# Patient Record
Sex: Female | Born: 1960
Health system: Southern US, Community
[De-identification: ages and names within clinical notes are randomized; demographics above are authoritative.]

## PROBLEM LIST (undated history)

## (undated) DIAGNOSIS — S83249A Other tear of medial meniscus, current injury, unspecified knee, initial encounter: Secondary | ICD-10-CM

## (undated) DIAGNOSIS — E039 Hypothyroidism, unspecified: Secondary | ICD-10-CM

## (undated) DIAGNOSIS — C801 Malignant (primary) neoplasm, unspecified: Secondary | ICD-10-CM

## (undated) DIAGNOSIS — Z9889 Other specified postprocedural states: Secondary | ICD-10-CM

## (undated) DIAGNOSIS — R51 Headache: Secondary | ICD-10-CM

## (undated) DIAGNOSIS — I10 Essential (primary) hypertension: Secondary | ICD-10-CM

## (undated) DIAGNOSIS — Z8489 Family history of other specified conditions: Secondary | ICD-10-CM

## (undated) DIAGNOSIS — F32A Depression, unspecified: Secondary | ICD-10-CM

## (undated) DIAGNOSIS — R112 Nausea with vomiting, unspecified: Secondary | ICD-10-CM

## (undated) DIAGNOSIS — R7303 Prediabetes: Secondary | ICD-10-CM

## (undated) DIAGNOSIS — G4730 Sleep apnea, unspecified: Secondary | ICD-10-CM

## (undated) DIAGNOSIS — E785 Hyperlipidemia, unspecified: Secondary | ICD-10-CM

## (undated) DIAGNOSIS — F419 Anxiety disorder, unspecified: Secondary | ICD-10-CM

## (undated) DIAGNOSIS — M1990 Unspecified osteoarthritis, unspecified site: Secondary | ICD-10-CM

## (undated) DIAGNOSIS — M5440 Lumbago with sciatica, unspecified side: Secondary | ICD-10-CM

## (undated) DIAGNOSIS — E119 Type 2 diabetes mellitus without complications: Secondary | ICD-10-CM

## (undated) HISTORY — DX: Other tear of medial meniscus, current injury, unspecified knee, initial encounter: S83.249A

## (undated) HISTORY — DX: Malignant (primary) neoplasm, unspecified: C80.1

## (undated) HISTORY — DX: Hyperlipidemia, unspecified: E78.5

## (undated) HISTORY — PX: MOUTH SURGERY: SHX715

## (undated) HISTORY — DX: Type 2 diabetes mellitus without complications: E11.9

## (undated) HISTORY — PX: ECTOPIC PREGNANCY SURGERY: SHX613

## (undated) HISTORY — PX: TUBAL LIGATION: SHX77

## (undated) HISTORY — PX: TONSILLECTOMY: SUR1361

## (undated) HISTORY — PX: SPINAL FUSION: SHX223

## (undated) HISTORY — DX: Essential (primary) hypertension: I10

## (undated) HISTORY — PX: GALLBLADDER SURGERY: SHX652

## (undated) HISTORY — PX: BILATERAL TOTAL MASTECTOMY WITH AXILLARY LYMPH NODE DISSECTION: SHX6364

## (undated) HISTORY — PX: COLONOSCOPY: SHX174

---

## 1997-12-12 ENCOUNTER — Ambulatory Visit (HOSPITAL_COMMUNITY): Admission: RE | Admit: 1997-12-12 | Discharge: 1997-12-12 | Payer: Self-pay | Admitting: Family Medicine

## 1999-04-11 ENCOUNTER — Other Ambulatory Visit: Admission: RE | Admit: 1999-04-11 | Discharge: 1999-04-11 | Payer: Self-pay | Admitting: Obstetrics and Gynecology

## 2000-05-07 ENCOUNTER — Other Ambulatory Visit: Admission: RE | Admit: 2000-05-07 | Discharge: 2000-05-07 | Payer: Self-pay | Admitting: Obstetrics and Gynecology

## 2000-06-25 ENCOUNTER — Encounter: Payer: Self-pay | Admitting: *Deleted

## 2000-06-25 ENCOUNTER — Observation Stay (HOSPITAL_COMMUNITY): Admission: EM | Admit: 2000-06-25 | Discharge: 2000-06-26 | Payer: Self-pay | Admitting: Emergency Medicine

## 2000-06-25 ENCOUNTER — Encounter: Payer: Self-pay | Admitting: Emergency Medicine

## 2000-07-14 ENCOUNTER — Encounter: Admission: RE | Admit: 2000-07-14 | Discharge: 2000-08-27 | Payer: Self-pay | Admitting: Orthopaedic Surgery

## 2002-04-01 ENCOUNTER — Encounter: Payer: Self-pay | Admitting: Internal Medicine

## 2002-04-01 ENCOUNTER — Encounter: Admission: RE | Admit: 2002-04-01 | Discharge: 2002-04-01 | Payer: Self-pay | Admitting: Internal Medicine

## 2002-06-17 ENCOUNTER — Other Ambulatory Visit: Admission: RE | Admit: 2002-06-17 | Discharge: 2002-06-17 | Payer: Self-pay | Admitting: Obstetrics and Gynecology

## 2002-12-01 ENCOUNTER — Encounter: Admission: RE | Admit: 2002-12-01 | Discharge: 2002-12-01 | Payer: Self-pay | Admitting: Surgery

## 2003-06-06 ENCOUNTER — Ambulatory Visit (HOSPITAL_COMMUNITY): Admission: RE | Admit: 2003-06-06 | Discharge: 2003-06-06 | Payer: Self-pay | Admitting: Gastroenterology

## 2003-07-28 ENCOUNTER — Other Ambulatory Visit: Admission: RE | Admit: 2003-07-28 | Discharge: 2003-07-28 | Payer: Self-pay | Admitting: Obstetrics and Gynecology

## 2003-08-09 ENCOUNTER — Encounter: Admission: RE | Admit: 2003-08-09 | Discharge: 2003-08-09 | Payer: Self-pay | Admitting: Obstetrics and Gynecology

## 2004-08-31 ENCOUNTER — Other Ambulatory Visit: Admission: RE | Admit: 2004-08-31 | Discharge: 2004-08-31 | Payer: Self-pay | Admitting: Obstetrics and Gynecology

## 2004-11-29 ENCOUNTER — Encounter: Admission: RE | Admit: 2004-11-29 | Discharge: 2005-01-20 | Payer: Self-pay | Admitting: *Deleted

## 2005-08-28 ENCOUNTER — Encounter: Admission: RE | Admit: 2005-08-28 | Discharge: 2005-08-28 | Payer: Self-pay | Admitting: *Deleted

## 2009-05-01 ENCOUNTER — Encounter: Admission: RE | Admit: 2009-05-01 | Discharge: 2009-05-01 | Payer: Self-pay | Admitting: Obstetrics and Gynecology

## 2010-06-08 NOTE — Op Note (Signed)
. [facility]  Patient:    [name]                      MRN: [id number] Proc. Date: 06/25/00 Adm. Date:  [id number] Disc. Date: [id number] Attending:  Kendell Bane CC:         Quita Skye. Artis Flock, M.D.   Operative Report  PREOPERATIVE DIAGNOSIS:  Near amputation, right small finger.  POSTOPERATIVE DIAGNOSIS:  Near amputation, right small finger.  OPERATION PERFORMED:  Repair of partial flexor digitorum profundus laceration, pin proximal interphalangeal joint, repair of radial digital artery and nerve, repair of ulnar digital nerve, repair of dorsal vein, right small finger.  SURGEON:  Lowell Bouton, M.D.  ANESTHESIA:  General.  OPERATIVE FINDINGS:  The patient had Moore laceration at the base of the right small finger that extended almost circumferentially.  There was Moore very [address] of skin radially and dorsally.  The ulnar digital artery was contused but was intact.  The FDS tendon was intact.  The flexor digitorum profundus was 50% laceration and the proximal interphalangeal joint was dislocated volarly.  The digital nerves were both transected as was the radial digital artery.  The extensor mechanism appeared to be intact.  DESCRIPTION OF PROCEDURE:  Under general anesthesia with Moore tourniquet on the right arm, the right hand was prepped and draped in the usual fashion and after exsanguinating the limb, the tourniquet was inflated to 250 mmHg.  The wound was explored volarly and was found to be almost Moore type of ring avulsion injury.  The laceration was extended proximally in Moore zigzag fashion back over the metacarpal head.  The profundus tendon was identified in the sheath and it was found to be 50% transected obliquely. It was repaired with Moore 3-0 Ethibond core suture just at the level beyond Moore-2.  It was reinforced with Moore 5-0 nylon to tidy up the edges of the repair and allow gliding beneath the Moore-2 pulley. The  PIP joint was then reduced closed and percutaneously pinned with Moore 4-5 K-wire x 2.  X-rays showed good alignment.  The pins were bent over and left protruding from the skin.  The extensor mechanism appeared to be intact.  The microscope was then brought in volarly and the radial digital artery was prepared for repair by trimming back the ends and dilating with dilators and ____________ solution.  The radial digital artery was repaired with Moore 10-0 nylon suture in an interrupted fashion.  The vascular clamps were removed and the tourniquet remained up.  The radial digital nerve was then identified and dissected out proximally and distally.  The ends were trimmed with the straight scissors and an epineurial repair was performed using 9-0 nylon.  The ulnar digital neurovascular bundle was then identified under the microscope and the ulnar digital artery appeared to be intact but contused.  The ulnar digital nerve was dissected out proximally and distally.  The ends were trimmed with the straight scissors and an epineurial repair was performed with 9-0 nylon.  The tourniquet was then released after two hours and there was good circulation to the finger.  The vascular repairs appeared to be functioning.  4-0 nylon was used to close the volar wounds.  The hand was then turned over to the dorsum and the microscope was brought back in to identify dorsal veins.  Moore large dorsal vein was evident distally and Moore vessel clamp was placed on it.  Moore  flap was lifted with Moore skin hook proximally and Moore smaller dorsal vein was identified.  Both veins were bleeding profusely and vessel clamps were applied.  The ends were trimmed and dilated with ____________ solution and Moore dilator and the dorsal vein was repaired with Moore 10-0 nylon suture.  The vessel clamps were removed and there was good flow both proximally and distally in the vein.  The dorsal skin was then barely reapproximated with 4-0 nylon suture leaving  plenty of area for the vessel to flow.  Sterile dressings were then applied followed by Moore dorsal protective splint.  The patient went to the recovery room awake and stable in good condition. DD:  06/25/00 TD:  06/26/00 Job: [id number]

## 2010-06-08 NOTE — Op Note (Signed)
NAME:  [name], [name]                         ACCOUNT NO.:  [id number]   MEDICAL RECORD NO.:  [id number]                   PATIENT TYPE:  AMB   LOCATION:  ENDO                                 FACILITY:  [facility]   PHYSICIAN:  Anselmo Rod, M.D.               DATE OF BIRTH:  [birth year]   DATE OF PROCEDURE:  06/06/2003  DATE OF DISCHARGE:                                 OPERATIVE REPORT   PROCEDURE PERFORMED:  Screening colonoscopy.   ENDOSCOPIST:  Charna Elizabeth, M.D.   INSTRUMENT USED:  Olympus video colonoscope.   INDICATIONS FOR PROCEDURE:  The patient is a 50 year old white female with a  history of rectal bleeding.  Rule out colonic polyps, masses, etc.   PREPROCEDURE PREPARATION:  Informed consent was procured from the patient.  The patient was fasted for eight hours prior to the procedure and prepped  with a bottle of magnesium citrate and a gallon of GoLYTELY the night prior  to the procedure.   PREPROCEDURE PHYSICAL:  The patient had stable vital signs.  Neck supple.  Chest clear to auscultation.  S1 and S2 regular.  Abdomen soft with normal  bowel sounds.   DESCRIPTION OF PROCEDURE:  The patient was placed in left lateral decubitus  position and sedated with 100 mg of Demerol and 10 mg of Versed in slow  incremental doses.  Once the patient was adequately sedated and maintained  on low flow oxygen and continuous cardiac monitoring, the Olympus video  colonoscope was advanced from the rectum to the cecum.  The appendicular  orifice and ileocecal valve were clearly visualized and photographed.  The  terminal ileum appeared healthy and without lesions.  Small internal  hemorrhoids were seen on retroflexion.  No masses or polyps were identified.  The patient tolerated the procedure well without immediate complications.   IMPRESSION:  Small internal hemorrhoids.  Otherwise normal colonoscopy up to  the terminal ileum.   RECOMMENDATIONS:  1. Continue high fiber diet with  liberal fluid intake.  2. Repeat colorectal cancer screening is recommended in the next 10 years     unless the patient develops any abnormal symptoms in the interim.  3. Outpatient followup as need arises in the future.                                               Anselmo Rod, M.D.    JNM/MEDQ  D:  06/06/2003  T:  06/06/2003  Job:  [id number]   cc:   Marcene Duos, M.D.  [address]  [place]  [place] [address]  Fax: [phone]

## 2011-11-11 ENCOUNTER — Ambulatory Visit: Payer: 59

## 2011-11-11 ENCOUNTER — Ambulatory Visit (INDEPENDENT_AMBULATORY_CARE_PROVIDER_SITE_OTHER): Payer: 59 | Admitting: Family Medicine

## 2011-11-11 VITALS — BP 142/78 | HR 87 | Temp 97.9°F | Resp 16 | Ht 63.0 in | Wt 227.0 lb

## 2011-11-11 DIAGNOSIS — M25472 Effusion, left ankle: Secondary | ICD-10-CM

## 2011-11-11 DIAGNOSIS — M25579 Pain in unspecified ankle and joints of unspecified foot: Secondary | ICD-10-CM

## 2011-11-11 DIAGNOSIS — M25572 Pain in left ankle and joints of left foot: Secondary | ICD-10-CM

## 2011-11-11 DIAGNOSIS — M25473 Effusion, unspecified ankle: Secondary | ICD-10-CM

## 2011-11-11 DIAGNOSIS — M25476 Effusion, unspecified foot: Secondary | ICD-10-CM

## 2011-11-11 MED ORDER — TRAMADOL HCL 50 MG PO TABS: 50.0000 mg | ORAL_TABLET | Freq: Three times a day (TID) | ORAL | Status: DC | PRN

## 2011-11-11 NOTE — Progress Notes (Signed)
 [facility]:  Office Visit  Chief Complaint:  Chief Complaint  Patient presents with  . Ankle Pain    left pain went hiking yesterday an slipped on a rock    HPI: [name] is a 51 y.o. female who complains of  Left ankle pain and swelling after falling during hike at [employer]. She was going down hill and did not see rock and inverted her ankle. She has tried RICE, and also Ibuprofen with some relief. No prior injuries or ankle surgeries. Denies and numbness or tingling. Sharp 10/10 pain with weight bearing, otherwise achey when just sitting. Radiates to dorsum of foot.   Past Medical History  Diagnosis Date  . Hypertension    Past Surgical History  Procedure Date  . Colon surgery   . Gallbladder surgery   . Cesarean section   . Tubal ligation    History   Social History  . Marital Status: Married    Spouse Name: N/A    Number of Children: N/A  . Years of Education: N/A   Social History Main Topics  . Smoking status: Never Smoker   . Smokeless tobacco: None  . Alcohol Use: No  . Drug Use: No  . Sexually Active: None   Other Topics Concern  . None   Social History Narrative  . None   Family History  Problem Relation Age of Onset  . Heart disease Father   . Hypertension Father   . Cancer Maternal Grandmother     breast cancer  . Diabetes Paternal Grandmother   . Heart disease Paternal Grandfather   . Hypertension Paternal Grandfather    Allergies  Allergen Reactions  . Effexor (Venlafaxine Hcl)     Weight loss and cant sleep   Prior to Admission medications   Medication Sig Start Date End Date Taking? Authorizing Provider  cyclobenzaprine (FLEXERIL) 10 MG tablet Take 10 mg by mouth 3 (three) times daily as needed.   Yes Historical Provider, MD  FEVERFEW PO Take by mouth.   Yes Historical Provider, MD  fish oil-omega-3 fatty acids 1000 MG capsule Take 2 g by mouth daily.   Yes Historical Provider, MD  metoprolol succinate  (TOPROL-XL) 100 MG 24 hr tablet Take 100 mg by mouth daily. Take with or immediately following a meal.   Yes Historical Provider, MD  Riboflavin 400 MG CAPS Take by mouth.   Yes Historical Provider, MD  rizatriptan (MAXALT) 10 MG tablet Take 10 mg by mouth as needed. May repeat in 2 hours if needed   Yes Historical Provider, MD  thyroid (ARMOUR) 30 MG tablet Take 30 mg by mouth daily.   Yes Historical Provider, MD  Vitamin D, Ergocalciferol, (DRISDOL) 50000 UNITS CAPS Take 50,000 Units by mouth.   Yes Historical Provider, MD  zolpidem (AMBIEN) 5 MG tablet Take 5 mg by mouth at bedtime as needed.   Yes Historical Provider, MD     ROS: The patient denies fevers, chills, night sweats, unintentional weight loss, chest pain, palpitations, wheezing, dyspnea on exertion, nausea, vomiting, abdominal pain, dysuria, hematuria, melena, numbness, weakness, or tingling.   All other systems have been reviewed and were otherwise negative with the exception of those mentioned in the HPI and as above.    PHYSICAL EXAM: Filed Vitals:   11/11/11 1454  BP: 142/78  Pulse: 87  Temp: 97.9 F (36.6 C)  Resp: 16   Filed Vitals:   11/11/11 1454  Height: 5\' 3"  (1.6  m)  Weight: 227 lb (102.967 kg)   Body mass index is 40.21 kg/(m^2).  General: Alert, no acute distress HEENT:  Normocephalic, atraumatic, oropharynx patent.  Cardiovascular:  Regular rate and rhythm, no rubs murmurs or gallops.  No Carotid bruits, radial pulse intact. No pedal edema.  Respiratory: Clear to auscultation bilaterally.  No wheezes, rales, or rhonchi.  No cyanosis, no use of accessory musculature GI: No organomegaly, abdomen is soft and non-tender, positive bowel sounds.  No masses. Skin: No rashes. Neurologic: Facial musculature symmetric. Psychiatric: Patient is appropriate throughout our interaction. Lymphatic: No cervical lymphadenopathy Musculoskeletal: Gait intact. Left knee-nl Left ankle-+ lateral malleoli swelling, + pain  with ROM in all direction , especially adduction, + DP, + post tib artery, 5/5 strength, senstationitact , 2/2 S1 DTR   LABS: No results found for this or any previous visit.   EKG/XRAY:   Primary read interpreted by Dr. Conley Rolls at [facility]. No obvious fracture/dislocation  but ? Ragged cortex syndesmosis + soft tissue swelling   ASSESSMENT/PLAN: Encounter Diagnoses  Name Primary?  . Pain, joint, ankle, left Yes  . Left ankle swelling    Crutches and Cam walker Continue with RICE Ibuprofen 600 mg QID with food Rx Tramadol for breakthrough pain F/u in 1 week.  Nonweightbearing for 2 days and then advance as tolerated     ,  PHUONG, DO 11/11/2011 3:27 PM

## 2011-11-19 ENCOUNTER — Ambulatory Visit (INDEPENDENT_AMBULATORY_CARE_PROVIDER_SITE_OTHER): Payer: 59 | Admitting: Family Medicine

## 2011-11-19 VITALS — BP 126/80 | HR 76 | Temp 97.6°F | Resp 18 | Ht 62.5 in | Wt 225.8 lb

## 2011-11-19 DIAGNOSIS — S93409A Sprain of unspecified ligament of unspecified ankle, initial encounter: Secondary | ICD-10-CM

## 2011-11-19 DIAGNOSIS — M25579 Pain in unspecified ankle and joints of unspecified foot: Secondary | ICD-10-CM

## 2011-11-19 DIAGNOSIS — M25572 Pain in left ankle and joints of left foot: Secondary | ICD-10-CM

## 2011-11-19 DIAGNOSIS — M25472 Effusion, left ankle: Secondary | ICD-10-CM

## 2011-11-19 DIAGNOSIS — S93402A Sprain of unspecified ligament of left ankle, initial encounter: Secondary | ICD-10-CM

## 2011-11-19 DIAGNOSIS — M25473 Effusion, unspecified ankle: Secondary | ICD-10-CM

## 2011-11-19 DIAGNOSIS — M25476 Effusion, unspecified foot: Secondary | ICD-10-CM

## 2011-11-19 MED ORDER — TRAMADOL HCL 50 MG PO TABS: 50.0000 mg | ORAL_TABLET | Freq: Three times a day (TID) | ORAL | Status: DC | PRN

## 2011-11-19 NOTE — Progress Notes (Signed)
 [facility]:  Office Visit  Chief Complaint:  Chief Complaint  Patient presents with  . Follow-up    L Ankle    HPI: [name] is a 51 y.o. female who complains of  Recheck for left ankle pain s/p twisting it 8 days ago. She has less pain today , she has been able to bear weight on it. Still has some night pain, swelling. Has been doing ABCs. Tramadol helps. Has been weightbearing about 30% pf here weight.   Past Medical History  Diagnosis Date  . Hypertension    Past Surgical History  Procedure Date  . Colon surgery   . Gallbladder surgery   . Cesarean section   . Tubal ligation    History   Social History  . Marital Status: Married    Spouse Name: N/A    Number of Children: N/A  . Years of Education: N/A   Social History Main Topics  . Smoking status: Never Smoker   . Smokeless tobacco: None  . Alcohol Use: No  . Drug Use: No  . Sexually Active: None   Other Topics Concern  . None   Social History Narrative  . None   Family History  Problem Relation Age of Onset  . Heart disease Father   . Hypertension Father   . Cancer Maternal Grandmother     breast cancer  . Diabetes Paternal Grandmother   . Heart disease Paternal Grandfather   . Hypertension Paternal Grandfather    Allergies  Allergen Reactions  . Effexor (Venlafaxine Hcl)     Weight loss and cant sleep   Prior to Admission medications   Medication Sig Start Date End Date Taking? Authorizing Provider  cyclobenzaprine (FLEXERIL) 10 MG tablet Take 10 mg by mouth 3 (three) times daily as needed.   Yes Historical Provider, MD  FEVERFEW PO Take by mouth.   Yes Historical Provider, MD  fish oil-omega-3 fatty acids 1000 MG capsule Take 2 g by mouth daily.   Yes Historical Provider, MD  ketoprofen (ORUDIS) 75 MG capsule Take 75 mg by mouth 4 (four) times daily as needed.   Yes Historical Provider, MD  metoCLOPramide (REGLAN) 10 MG tablet Take 10 mg by mouth 4 (four) times  daily.   Yes Historical Provider, MD  metoprolol succinate (TOPROL-XL) 100 MG 24 hr tablet Take 100 mg by mouth daily. Take with or immediately following a meal.   Yes Historical Provider, MD  Riboflavin 400 MG CAPS Take by mouth.   Yes Historical Provider, MD  rizatriptan (MAXALT) 10 MG tablet Take 10 mg by mouth as needed. May repeat in 2 hours if needed   Yes Historical Provider, MD  thyroid (ARMOUR) 30 MG tablet Take 30 mg by mouth daily.   Yes Historical Provider, MD  traMADol (ULTRAM) 50 MG tablet Take 1 tablet (50 mg total) by mouth every 8 (eight) hours as needed for pain. 11/11/11  Yes  P , DO  Vitamin D, Ergocalciferol, (DRISDOL) 50000 UNITS CAPS Take 50,000 Units by mouth.   Yes Historical Provider, MD  zolpidem (AMBIEN) 5 MG tablet Take 5 mg by mouth at bedtime as needed.   Yes Historical Provider, MD     ROS: The patient denies fevers, chills, night sweats, unintentional weight loss, chest pain, palpitations, wheezing, dyspnea on exertion, nausea, vomiting, abdominal pain, dysuria, hematuria, melena, numbness, weakness, or tingling.  All other systems have been reviewed and were otherwise negative with the exception of those  mentioned in the HPI and as above.    PHYSICAL EXAM: Filed Vitals:   11/19/11 1348  BP: 126/80  Pulse: 76  Temp: 97.6 F (36.4 C)  Resp: 18   Filed Vitals:   11/19/11 1348  Height: 5' 2.5" (1.588 m)  Weight: 225 lb 12.8 oz (102.422 kg)   Body mass index is 40.64 kg/(m^2).  General: Alert, no acute distress HEENT:  Normocephalic, atraumatic, oropharynx patent.  Cardiovascular:  Regular rate and rhythm, no rubs murmurs or gallops.  No Carotid bruits, radial pulse intact. No pedal edema.  Respiratory: Clear to auscultation bilaterally.  No wheezes, rales, or rhonchi.  No cyanosis, no use of accessory musculature GI: No organomegaly, abdomen is soft and non-tender, positive bowel sounds.  No masses. Skin: No rashes. Neurologic: Facial  musculature symmetric. Psychiatric: Patient is appropriate throughout our interaction. Lymphatic: No cervical lymphadenopathy Musculoskeletal: Gait intact. Left ankle- + lateral edema, + DP, ROM intact but painful with inversions and eversion, 5/5 strength,sensation itnact   LABS: No results found for this or any previous visit.   EKG/XRAY:   Primary read interpreted by Dr. Conley Rolls at [facility].   ASSESSMENT/PLAN: Encounter Diagnosis  Name Primary?  . Left ankle sprain Yes   Continue with RICE, ABCs Use sweedo in 1 week, transition from cam walker to sweedo if able to put more weight on foot F/u in 2 weeks if not improving, sooner if worse    ,  PHUONG, DO 11/19/2011 2:44 PM

## 2012-01-22 DIAGNOSIS — Z9889 Other specified postprocedural states: Secondary | ICD-10-CM

## 2012-01-22 HISTORY — DX: Other specified postprocedural states: Z98.890

## 2013-03-05 ENCOUNTER — Other Ambulatory Visit: Payer: Self-pay | Admitting: Neurological Surgery

## 2013-03-10 ENCOUNTER — Encounter (HOSPITAL_COMMUNITY): Payer: Self-pay | Admitting: Pharmacy Technician

## 2013-03-12 ENCOUNTER — Other Ambulatory Visit (HOSPITAL_COMMUNITY): Payer: Self-pay | Admitting: *Deleted

## 2013-03-12 NOTE — Pre-Procedure Instructions (Addendum)
JEYLA BULGER  03/12/2013   Your procedure is scheduled on:  Friday, March 19, 2013 at 12:00 PM.   Report to [facility] Entrance "A" Admitting Office at 9:00 AM.   Call this number if you have problems the morning of surgery: [phone]   Remember:   Do not eat food or drink liquids after midnight Thursday, 03/18/13.   Take these medicines the morning of surgery with A SIP OF WATER: loratadine (CLARITIN), ranitidine (ZANTAC), thyroid (ARMOUR).  You may take HYDROcodone-acetaminophen (NORCO/VICODIN) - if needed, baclofen (LIORESAL) - if needed, and methocarbamol (ROBAXIN) - if needed.  Stop all Vitamins, Herbal Medications, Ketoprofen and Mobic as of today. Do not take any other NSAIDS (Ibuprofen, Aleve, etc) prior to surgery.    Do not wear jewelry, make-up or nail polish.  Do not wear lotions, powders, or perfumes. You may wear deodorant.  Do not shave 48 hours prior to surgery.   Do not bring valuables to the hospital.  [facility] is not responsible                  for any belongings or valuables.               Contacts, dentures or bridgework may not be worn into surgery.  Leave suitcase in the car. After surgery it may be brought to your room.  For patients admitted to the hospital, discharge time is determined by your                treatment team.                 Special Instructions: [facility] - Preparing for Surgery  Before surgery, you can play an important role.  Because skin is not sterile, your skin needs to be as free of germs as possible.  You can reduce the number of germs on you skin by washing with CHG (chlorahexidine gluconate) soap before surgery.  CHG is an antiseptic cleaner which kills germs and bonds with the skin to continue killing germs even after washing.  Please DO NOT use if you have an allergy to CHG or antibacterial soaps.  If your skin becomes reddened/irritated stop using the CHG and inform your nurse when you arrive at Short  Stay.  Do not shave (including legs and underarms) for at least 48 hours prior to the first CHG shower.  You may shave your face.  Please follow these instructions carefully:   1.  Shower with CHG Soap the night before surgery and the                                morning of Surgery.  2.  If you choose to wash your hair, wash your hair first as usual with your       normal shampoo.  3.  After you shampoo, rinse your hair and body thoroughly to remove the                      Shampoo.  4.  Use CHG as you would any other liquid soap.  You can apply chg directly       to the skin and wash gently with scrungie or a clean washcloth.  5.  Apply the CHG Soap to your body ONLY FROM THE NECK DOWN.        Do not use on open wounds or  open sores.  Avoid contact with your eyes, ears, mouth and genitals (private parts).  Wash genitals (private parts) with your normal soap.  6.  Wash thoroughly, paying special attention to the area where your surgery        will be performed.  7.  Thoroughly rinse your body with warm water from the neck down.  8.  DO NOT shower/wash with your normal soap after using and rinsing off       the CHG Soap.  9.  Pat yourself dry with a clean towel.            10.  Wear clean pajamas.            11.  Place clean sheets on your bed the night of your first shower and do not        sleep with pets.  Day of Surgery  Do not apply any lotions the morning of surgery.  Please wear clean clothes to the hospital/surgery center.     Please read over the following fact sheets that you were given: Pain Booklet, Coughing and Deep Breathing, MRSA Information and Surgical Site Infection Prevention

## 2013-03-15 ENCOUNTER — Encounter (HOSPITAL_COMMUNITY)
Admission: RE | Admit: 2013-03-15 | Discharge: 2013-03-15 | Disposition: A | Discharge status: Home / Self Care | Payer: 59 | Source: Ambulatory Visit | Attending: Anesthesiology | Admitting: Anesthesiology

## 2013-03-15 ENCOUNTER — Encounter (HOSPITAL_COMMUNITY): Payer: Self-pay

## 2013-03-15 ENCOUNTER — Encounter (HOSPITAL_COMMUNITY)
Admission: RE | Admit: 2013-03-15 | Discharge: 2013-03-15 | Disposition: A | Discharge status: Home / Self Care | Payer: 59 | Source: Ambulatory Visit | Attending: Neurological Surgery | Admitting: Neurological Surgery

## 2013-03-15 DIAGNOSIS — Z01810 Encounter for preprocedural cardiovascular examination: Secondary | ICD-10-CM | POA: Insufficient documentation

## 2013-03-15 DIAGNOSIS — Z01818 Encounter for other preprocedural examination: Secondary | ICD-10-CM | POA: Insufficient documentation

## 2013-03-15 DIAGNOSIS — Z01812 Encounter for preprocedural laboratory examination: Secondary | ICD-10-CM | POA: Insufficient documentation

## 2013-03-15 HISTORY — DX: Sleep apnea, unspecified: G47.30

## 2013-03-15 HISTORY — DX: Family history of other specified conditions: Z84.89

## 2013-03-15 HISTORY — DX: Headache: R51

## 2013-03-15 HISTORY — DX: Hypothyroidism, unspecified: E03.9

## 2013-03-15 HISTORY — DX: Other specified postprocedural states: R11.2

## 2013-03-15 HISTORY — DX: Nausea with vomiting, unspecified: Z98.890

## 2013-03-15 LAB — BASIC METABOLIC PANEL
BUN: 12 mg/dL (ref 6–23)
CALCIUM: 9.4 mg/dL (ref 8.4–10.5)
CO2: 25 mEq/L (ref 19–32)
CREATININE: 0.61 mg/dL (ref 0.50–1.10)
Chloride: 104 mEq/L (ref 96–112)
GFR calc Af Amer: >90 mL/min (ref >90)
GFR calc non Af Amer: >90 mL/min (ref >90)
GLUCOSE: 110 mg/dL — AB (ref 70–99)
Potassium: 4.3 mEq/L (ref 3.7–5.3)
Sodium: 140 mEq/L (ref 137–147)

## 2013-03-15 LAB — CBC
HCT: 41.3 % (ref 36.0–46.0)
Hemoglobin: 14.6 g/dL (ref 12.0–15.0)
MCH: 30.3 pg (ref 26.0–34.0)
MCHC: 35.4 g/dL (ref 30.0–36.0)
MCV: 85.7 fL (ref 78.0–100.0)
PLATELETS: 227 10*3/uL (ref 150–400)
RBC: 4.82 MIL/uL (ref 3.87–5.11)
RDW: 13.4 % (ref 11.5–15.5)
WBC: 7.1 10*3/uL (ref 4.0–10.5)

## 2013-03-15 LAB — SURGICAL PCR SCREEN
MRSA, PCR: NEGATIVE
Staphylococcus aureus: NEGATIVE

## 2013-03-15 LAB — HCG, SERUM, QUALITATIVE: Preg, Serum: NEGATIVE

## 2013-03-18 MED ORDER — CEFAZOLIN SODIUM-DEXTROSE 2-3 GM-% IV SOLR
2.0000 g | INTRAVENOUS | Status: AC
  Administered 2013-03-19: 2 g via INTRAVENOUS
  Filled 2013-03-18: qty 50

## 2013-03-19 ENCOUNTER — Observation Stay (HOSPITAL_COMMUNITY): Payer: 59

## 2013-03-19 ENCOUNTER — Ambulatory Visit (HOSPITAL_COMMUNITY): Payer: 59 | Admitting: Anesthesiology

## 2013-03-19 ENCOUNTER — Encounter (HOSPITAL_COMMUNITY): Payer: Self-pay | Admitting: Surgery

## 2013-03-19 ENCOUNTER — Encounter (HOSPITAL_COMMUNITY)
Admission: RE | Disposition: A | Discharge status: Home / Self Care | Payer: Self-pay | Source: Ambulatory Visit | Attending: Neurological Surgery

## 2013-03-19 ENCOUNTER — Observation Stay (HOSPITAL_COMMUNITY)
Admission: RE | Admit: 2013-03-19 | Discharge: 2013-03-20 | Disposition: A | Discharge status: Home / Self Care | Payer: 59 | Source: Ambulatory Visit | Attending: Neurological Surgery | Admitting: Neurological Surgery

## 2013-03-19 ENCOUNTER — Encounter (HOSPITAL_COMMUNITY): Payer: 59 | Admitting: Anesthesiology

## 2013-03-19 DIAGNOSIS — Z79899 Other long term (current) drug therapy: Secondary | ICD-10-CM | POA: Insufficient documentation

## 2013-03-19 DIAGNOSIS — M47812 Spondylosis without myelopathy or radiculopathy, cervical region: Principal | ICD-10-CM | POA: Insufficient documentation

## 2013-03-19 DIAGNOSIS — I10 Essential (primary) hypertension: Secondary | ICD-10-CM | POA: Insufficient documentation

## 2013-03-19 DIAGNOSIS — Z9089 Acquired absence of other organs: Secondary | ICD-10-CM | POA: Insufficient documentation

## 2013-03-19 DIAGNOSIS — G5600 Carpal tunnel syndrome, unspecified upper limb: Secondary | ICD-10-CM | POA: Insufficient documentation

## 2013-03-19 DIAGNOSIS — E039 Hypothyroidism, unspecified: Secondary | ICD-10-CM | POA: Insufficient documentation

## 2013-03-19 HISTORY — PX: CERVICAL DISC ARTHROPLASTY: SHX587

## 2013-03-19 SURGERY — CERVICAL ANTERIOR DISC ARTHROPLASTY
Anesthesia: General | Site: Neck

## 2013-03-19 MED ORDER — BUPIVACAINE HCL (PF) 0.5 % IJ SOLN
INTRAMUSCULAR | Status: DC | PRN
  Administered 2013-03-19: 5 mL

## 2013-03-19 MED ORDER — MORPHINE SULFATE 2 MG/ML IJ SOLN
1.0000 mg | INTRAMUSCULAR | Status: DC | PRN
  Administered 2013-03-19 – 2013-03-20 (×3): 2 mg via INTRAVENOUS
  Filled 2013-03-19 (×3): qty 1

## 2013-03-19 MED ORDER — NEOSTIGMINE METHYLSULFATE 1 MG/ML IJ SOLN
INTRAMUSCULAR | Status: DC | PRN
  Administered 2013-03-19: 4 mg via INTRAVENOUS

## 2013-03-19 MED ORDER — THYROID 30 MG PO TABS
30.0000 mg | ORAL_TABLET | Freq: Every day | ORAL | Status: DC
  Filled 2013-03-19: qty 1

## 2013-03-19 MED ORDER — LACTATED RINGERS IV SOLN
INTRAVENOUS | Status: DC
  Administered 2013-03-19: 09:00:00 via INTRAVENOUS

## 2013-03-19 MED ORDER — SENNA 8.6 MG PO TABS
1.0000 | ORAL_TABLET | Freq: Two times a day (BID) | ORAL | Status: DC
  Filled 2013-03-19 (×2): qty 1

## 2013-03-19 MED ORDER — BISACODYL 10 MG RE SUPP: 10.0000 mg | Freq: Every day | RECTAL | Status: DC | PRN

## 2013-03-19 MED ORDER — THROMBIN 5000 UNITS EX SOLR
CUTANEOUS | Status: DC | PRN
  Administered 2013-03-19 (×2): 5000 [IU] via TOPICAL

## 2013-03-19 MED ORDER — OXYCODONE-ACETAMINOPHEN 5-325 MG PO TABS: 1.0000 | ORAL_TABLET | ORAL | Status: DC | PRN

## 2013-03-19 MED ORDER — PROPOFOL 10 MG/ML IV BOLUS
INTRAVENOUS | Status: AC
  Filled 2013-03-19: qty 20

## 2013-03-19 MED ORDER — 0.9 % SODIUM CHLORIDE (POUR BTL) OPTIME
TOPICAL | Status: DC | PRN
  Administered 2013-03-19: 1000 mL

## 2013-03-19 MED ORDER — SODIUM CHLORIDE 0.9 % IJ SOLN
3.0000 mL | Freq: Two times a day (BID) | INTRAMUSCULAR | Status: DC
  Administered 2013-03-19: 3 mL via INTRAVENOUS

## 2013-03-19 MED ORDER — PROPOFOL 10 MG/ML IV BOLUS
INTRAVENOUS | Status: DC | PRN
  Administered 2013-03-19: 20 mg via INTRAVENOUS
  Administered 2013-03-19: 50 mg via INTRAVENOUS
  Administered 2013-03-19: 130 mg via INTRAVENOUS

## 2013-03-19 MED ORDER — MIDAZOLAM HCL 5 MG/5ML IJ SOLN
INTRAMUSCULAR | Status: DC | PRN
  Administered 2013-03-19: 2 mg via INTRAVENOUS

## 2013-03-19 MED ORDER — LORATADINE 10 MG PO TABS
10.0000 mg | ORAL_TABLET | Freq: Every day | ORAL | Status: DC
  Filled 2013-03-19 (×2): qty 1

## 2013-03-19 MED ORDER — POLYETHYLENE GLYCOL 3350 17 G PO PACK
17.0000 g | PACK | Freq: Every day | ORAL | Status: DC | PRN
  Filled 2013-03-19: qty 1

## 2013-03-19 MED ORDER — ACETAMINOPHEN 650 MG RE SUPP: 650.0000 mg | RECTAL | Status: DC | PRN

## 2013-03-19 MED ORDER — PROMETHAZINE HCL 25 MG/ML IJ SOLN
INTRAMUSCULAR | Status: DC | PRN
  Administered 2013-03-19 (×2): 12.5 mg via INTRAVENOUS

## 2013-03-19 MED ORDER — ROCURONIUM BROMIDE 100 MG/10ML IV SOLN
INTRAVENOUS | Status: DC | PRN
  Administered 2013-03-19: 50 mg via INTRAVENOUS
  Administered 2013-03-19: 10 mg via INTRAVENOUS

## 2013-03-19 MED ORDER — ALUM & MAG HYDROXIDE-SIMETH 200-200-20 MG/5ML PO SUSP: 30.0000 mL | Freq: Four times a day (QID) | ORAL | Status: DC | PRN

## 2013-03-19 MED ORDER — ONDANSETRON HCL 4 MG/2ML IJ SOLN
4.0000 mg | INTRAMUSCULAR | Status: DC | PRN
  Administered 2013-03-19: 4 mg via INTRAVENOUS
  Filled 2013-03-19: qty 2

## 2013-03-19 MED ORDER — FENTANYL CITRATE 0.05 MG/ML IJ SOLN
INTRAMUSCULAR | Status: AC
  Filled 2013-03-19: qty 5

## 2013-03-19 MED ORDER — FENTANYL CITRATE 0.05 MG/ML IJ SOLN
INTRAMUSCULAR | Status: DC | PRN
  Administered 2013-03-19: 150 ug via INTRAVENOUS
  Administered 2013-03-19 (×2): 50 ug via INTRAVENOUS

## 2013-03-19 MED ORDER — METHOCARBAMOL 100 MG/ML IJ SOLN
500.0000 mg | Freq: Four times a day (QID) | INTRAVENOUS | Status: DC | PRN
  Filled 2013-03-19: qty 5

## 2013-03-19 MED ORDER — KETOROLAC TROMETHAMINE 15 MG/ML IJ SOLN
15.0000 mg | Freq: Four times a day (QID) | INTRAMUSCULAR | Status: DC
  Administered 2013-03-19 – 2013-03-20 (×3): 15 mg via INTRAVENOUS
  Filled 2013-03-19 (×5): qty 1

## 2013-03-19 MED ORDER — ACETAMINOPHEN 325 MG PO TABS: 650.0000 mg | ORAL_TABLET | ORAL | Status: DC | PRN

## 2013-03-19 MED ORDER — LIDOCAINE-EPINEPHRINE 1 %-1:100000 IJ SOLN
INTRAMUSCULAR | Status: DC | PRN
  Administered 2013-03-19: 5 mL

## 2013-03-19 MED ORDER — LIDOCAINE HCL (CARDIAC) 20 MG/ML IV SOLN
INTRAVENOUS | Status: DC | PRN
  Administered 2013-03-19: 50 mg via INTRAVENOUS

## 2013-03-19 MED ORDER — PHENOL 1.4 % MT LIQD: 1.0000 | OROMUCOSAL | Status: DC | PRN

## 2013-03-19 MED ORDER — SUMATRIPTAN SUCCINATE 50 MG PO TABS: 50.0000 mg | ORAL_TABLET | ORAL | Status: DC | PRN

## 2013-03-19 MED ORDER — DIPHENHYDRAMINE HCL 50 MG/ML IJ SOLN
INTRAMUSCULAR | Status: AC
  Filled 2013-03-19: qty 1

## 2013-03-19 MED ORDER — GLYCOPYRROLATE 0.2 MG/ML IJ SOLN
INTRAMUSCULAR | Status: DC | PRN
  Administered 2013-03-19: 0.6 mg via INTRAVENOUS

## 2013-03-19 MED ORDER — METHOCARBAMOL 500 MG PO TABS: 500.0000 mg | ORAL_TABLET | Freq: Four times a day (QID) | ORAL | Status: DC | PRN

## 2013-03-19 MED ORDER — ONDANSETRON HCL 4 MG/2ML IJ SOLN
INTRAMUSCULAR | Status: DC | PRN
  Administered 2013-03-19: 4 mg via INTRAVENOUS

## 2013-03-19 MED ORDER — HYDROMORPHONE HCL PF 1 MG/ML IJ SOLN
INTRAMUSCULAR | Status: AC
  Filled 2013-03-19: qty 1

## 2013-03-19 MED ORDER — SCOPOLAMINE 1 MG/3DAYS TD PT72
1.0000 | MEDICATED_PATCH | TRANSDERMAL | Status: DC
  Administered 2013-03-19: 1.5 mg via TRANSDERMAL

## 2013-03-19 MED ORDER — DOCUSATE SODIUM 100 MG PO CAPS
100.0000 mg | ORAL_CAPSULE | Freq: Two times a day (BID) | ORAL | Status: DC
  Administered 2013-03-19: 100 mg via ORAL
  Filled 2013-03-19 (×3): qty 1

## 2013-03-19 MED ORDER — HYDROMORPHONE HCL PF 1 MG/ML IJ SOLN
0.2500 mg | INTRAMUSCULAR | Status: DC | PRN
  Administered 2013-03-19 (×2): 0.25 mg via INTRAVENOUS
  Administered 2013-03-19: 0.5 mg via INTRAVENOUS

## 2013-03-19 MED ORDER — METOPROLOL SUCCINATE ER 100 MG PO TB24
100.0000 mg | ORAL_TABLET | Freq: Every evening | ORAL | Status: DC
  Administered 2013-03-19: 100 mg via ORAL
  Filled 2013-03-19 (×2): qty 1

## 2013-03-19 MED ORDER — HYDROCODONE-ACETAMINOPHEN 5-325 MG PO TABS: 0.5000 | ORAL_TABLET | Freq: Four times a day (QID) | ORAL | Status: DC | PRN

## 2013-03-19 MED ORDER — LIDOCAINE HCL (CARDIAC) 20 MG/ML IV SOLN: INTRAVENOUS | Status: DC | PRN

## 2013-03-19 MED ORDER — DEXAMETHASONE SODIUM PHOSPHATE 10 MG/ML IJ SOLN
INTRAMUSCULAR | Status: DC | PRN
  Administered 2013-03-19: 10 mg via INTRAVENOUS

## 2013-03-19 MED ORDER — SODIUM CHLORIDE 0.9 % IJ SOLN: 3.0000 mL | INTRAMUSCULAR | Status: DC | PRN

## 2013-03-19 MED ORDER — LACTATED RINGERS IV SOLN
INTRAVENOUS | Status: DC | PRN
  Administered 2013-03-19 (×2): via INTRAVENOUS

## 2013-03-19 MED ORDER — PROMETHAZINE HCL 25 MG/ML IJ SOLN
INTRAMUSCULAR | Status: AC
  Filled 2013-03-19: qty 1

## 2013-03-19 MED ORDER — FAMOTIDINE 20 MG PO TABS
20.0000 mg | ORAL_TABLET | Freq: Two times a day (BID) | ORAL | Status: DC
  Administered 2013-03-19: 20 mg via ORAL
  Filled 2013-03-19 (×3): qty 1

## 2013-03-19 MED ORDER — OXYCODONE-ACETAMINOPHEN 5-325 MG PO TABS
1.0000 | ORAL_TABLET | ORAL | Status: DC | PRN
  Administered 2013-03-19 – 2013-03-20 (×2): 2 via ORAL
  Filled 2013-03-19 (×2): qty 2

## 2013-03-19 MED ORDER — DIPHENHYDRAMINE HCL 50 MG/ML IJ SOLN
INTRAMUSCULAR | Status: DC | PRN
  Administered 2013-03-19: 12.5 mg via INTRAVENOUS

## 2013-03-19 MED ORDER — ARTIFICIAL TEARS OP OINT
TOPICAL_OINTMENT | OPHTHALMIC | Status: DC | PRN
  Administered 2013-03-19: 1 via OPHTHALMIC

## 2013-03-19 MED ORDER — SCOPOLAMINE 1 MG/3DAYS TD PT72
MEDICATED_PATCH | TRANSDERMAL | Status: AC
  Filled 2013-03-19: qty 1

## 2013-03-19 MED ORDER — CEFAZOLIN SODIUM 1-5 GM-% IV SOLN
1.0000 g | Freq: Three times a day (TID) | INTRAVENOUS | Status: AC
  Administered 2013-03-19 – 2013-03-20 (×2): 1 g via INTRAVENOUS
  Filled 2013-03-19 (×2): qty 50

## 2013-03-19 MED ORDER — MENTHOL 3 MG MT LOZG: 1.0000 | LOZENGE | OROMUCOSAL | Status: DC | PRN

## 2013-03-19 MED ORDER — MELOXICAM 7.5 MG PO TABS
7.5000 mg | ORAL_TABLET | Freq: Two times a day (BID) | ORAL | Status: DC
  Administered 2013-03-19: 7.5 mg via ORAL
  Filled 2013-03-19 (×3): qty 1

## 2013-03-19 MED ORDER — MIDAZOLAM HCL 2 MG/2ML IJ SOLN
INTRAMUSCULAR | Status: AC
  Filled 2013-03-19: qty 2

## 2013-03-19 MED ORDER — BACLOFEN 10 MG PO TABS
10.0000 mg | ORAL_TABLET | Freq: Two times a day (BID) | ORAL | Status: DC | PRN
  Filled 2013-03-19: qty 1

## 2013-03-19 MED ORDER — SODIUM CHLORIDE 0.9 % IR SOLN
Status: DC | PRN
  Administered 2013-03-19: 13:00:00

## 2013-03-19 SURGICAL SUPPLY — 71 items
ADH SKN CLS APL DERMABOND .7 (GAUZE/BANDAGES/DRESSINGS) ×1
ADH SKN CLS LQ APL DERMABOND (GAUZE/BANDAGES/DRESSINGS) ×1
BAG DECANTER FOR FLEXI CONT (MISCELLANEOUS) ×3 IMPLANT
BANDAGE GAUZE ELAST BULKY 4 IN (GAUZE/BANDAGES/DRESSINGS) IMPLANT
BIT DRILL NEURO 2X3.1 SFT TUCH (MISCELLANEOUS) ×1 IMPLANT
BUR BARREL STRAIGHT FLUTE 4.0 (BURR) ×3 IMPLANT
BUR EGG ELITE 5.0 (BURR) ×1 IMPLANT
BUR EGG ELITE 5.0MM (BURR) ×1
CANISTER SUCT 3000ML (MISCELLANEOUS) ×3 IMPLANT
CONT SPEC 4OZ CLIKSEAL STRL BL (MISCELLANEOUS) ×3 IMPLANT
CUTTER 15MM (MISCELLANEOUS) ×4 IMPLANT
DECANTER SPIKE VIAL GLASS SM (MISCELLANEOUS) ×3 IMPLANT
DERMABOND ADHESIVE PROPEN (GAUZE/BANDAGES/DRESSINGS) ×2
DERMABOND ADVANCED (GAUZE/BANDAGES/DRESSINGS) ×2
DERMABOND ADVANCED .7 DNX12 (GAUZE/BANDAGES/DRESSINGS) ×1 IMPLANT
DERMABOND ADVANCED .7 DNX6 (GAUZE/BANDAGES/DRESSINGS) IMPLANT
DISC CERVICAL BRYAN 15MM (Neuro Prosthesis/Implant) ×4 IMPLANT
DRAPE C-ARM 42X72 X-RAY (DRAPES) ×6 IMPLANT
DRAPE LAPAROTOMY 100X72 PEDS (DRAPES) ×3 IMPLANT
DRAPE MICROSCOPE LEICA (MISCELLANEOUS) IMPLANT
DRAPE POUCH INSTRU U-SHP 10X18 (DRAPES) ×3 IMPLANT
DRESSING TELFA 8X3 (GAUZE/BANDAGES/DRESSINGS) ×3 IMPLANT
DRILL BIT (BIT) ×2 IMPLANT
DRILL NEURO 2X3.1 SOFT TOUCH (MISCELLANEOUS) ×3
DRSG OPSITE 4X5.5 SM (GAUZE/BANDAGES/DRESSINGS) ×3 IMPLANT
DURAPREP 6ML APPLICATOR 50/CS (WOUND CARE) ×3 IMPLANT
ELECT REM PT RETURN 9FT ADLT (ELECTROSURGICAL) ×3
ELECTRODE REM PT RTRN 9FT ADLT (ELECTROSURGICAL) ×1 IMPLANT
GAUZE SPONGE 4X4 16PLY XRAY LF (GAUZE/BANDAGES/DRESSINGS) IMPLANT
GLOVE BIO SURGEON STRL SZ7.5 (GLOVE) IMPLANT
GLOVE BIOGEL PI IND STRL 7.0 (GLOVE) IMPLANT
GLOVE BIOGEL PI IND STRL 7.5 (GLOVE) IMPLANT
GLOVE BIOGEL PI IND STRL 8 (GLOVE) IMPLANT
GLOVE BIOGEL PI IND STRL 8.5 (GLOVE) ×1 IMPLANT
GLOVE BIOGEL PI INDICATOR 7.0 (GLOVE) ×6
GLOVE BIOGEL PI INDICATOR 7.5 (GLOVE)
GLOVE BIOGEL PI INDICATOR 8 (GLOVE) ×2
GLOVE BIOGEL PI INDICATOR 8.5 (GLOVE) ×4
GLOVE ECLIPSE 7.5 STRL STRAW (GLOVE) ×2 IMPLANT
GLOVE ECLIPSE 8.5 STRL (GLOVE) ×5 IMPLANT
GLOVE EXAM NITRILE LRG STRL (GLOVE) IMPLANT
GLOVE EXAM NITRILE MD LF STRL (GLOVE) IMPLANT
GLOVE EXAM NITRILE XL STR (GLOVE) IMPLANT
GLOVE EXAM NITRILE XS STR PU (GLOVE) IMPLANT
GLOVE SS BIOGEL STRL SZ 6.5 (GLOVE) IMPLANT
GLOVE SUPERSENSE BIOGEL SZ 6.5 (GLOVE) ×6
GOWN BRE IMP SLV AUR LG STRL (GOWN DISPOSABLE) IMPLANT
GOWN BRE IMP SLV AUR XL STRL (GOWN DISPOSABLE) ×1 IMPLANT
GOWN STRL REIN 2XL LVL4 (GOWN DISPOSABLE) ×1 IMPLANT
GOWN STRL REUS W/ TWL LRG LVL3 (GOWN DISPOSABLE) IMPLANT
GOWN STRL REUS W/ TWL XL LVL3 (GOWN DISPOSABLE) IMPLANT
GOWN STRL REUS W/TWL 2XL LVL3 (GOWN DISPOSABLE) ×4 IMPLANT
GOWN STRL REUS W/TWL LRG LVL3 (GOWN DISPOSABLE) ×6
GOWN STRL REUS W/TWL XL LVL3 (GOWN DISPOSABLE) ×3
HEAD HALTER (SOFTGOODS) ×3 IMPLANT
KIT BASIN OR (CUSTOM PROCEDURE TRAY) ×3 IMPLANT
KIT ROOM TURNOVER OR (KITS) ×3 IMPLANT
NDL SPNL 22GX3.5 QUINCKE BK (NEEDLE) ×1 IMPLANT
NEEDLE HYPO 22GX1.5 SAFETY (NEEDLE) ×3 IMPLANT
NEEDLE SPNL 22GX3.5 QUINCKE BK (NEEDLE) ×3 IMPLANT
NS IRRIG 1000ML POUR BTL (IV SOLUTION) ×3 IMPLANT
PACK LAMINECTOMY NEURO (CUSTOM PROCEDURE TRAY) ×3 IMPLANT
PAD ARMBOARD 7.5X6 YLW CONV (MISCELLANEOUS) ×9 IMPLANT
RUBBERBAND STERILE (MISCELLANEOUS) IMPLANT
SPONGE INTESTINAL PEANUT (DISPOSABLE) ×3 IMPLANT
SPONGE SURGIFOAM ABS GEL SZ50 (HEMOSTASIS) ×3 IMPLANT
SUT VIC AB 3-0 SH 8-18 (SUTURE) ×6 IMPLANT
SYR 20ML ECCENTRIC (SYRINGE) ×3 IMPLANT
TOWEL OR 17X24 6PK STRL BLUE (TOWEL DISPOSABLE) ×3 IMPLANT
TOWEL OR 17X26 10 PK STRL BLUE (TOWEL DISPOSABLE) ×3 IMPLANT
WATER STERILE IRR 1000ML POUR (IV SOLUTION) ×3 IMPLANT

## 2013-03-19 NOTE — Progress Notes (Addendum)
RT placed patient on cpap 9cmH20 on room air via patient's home mask. Patient is tolerating cpap well at this time. RT will continue to monitor.

## 2013-03-19 NOTE — Discharge Instructions (Signed)
Wound Care °Leave incision open to air. °You may shower. °Do not scrub directly on incision.  °Do not put any creams, lotions, or ointments on incision. °Activity °Walk each and every day, increasing distance each day. °No lifting greater than 5 lbs.  Avoid excessive neck motion. °No driving for 2 weeks; may ride as a passenger locally. °Wear neck brace at all times except when showering.  If provided soft collar, may wear for comfort unless otherwise instructed. °Diet °Resume your normal diet.  °Return to Work °Will be discussed at you follow up appointment. °Call Your Doctor If Any of These Occur °Redness, drainage, or swelling at the wound.  °Temperature greater than 101 degrees. °Severe pain not relieved by pain medication. °Increased difficulty swallowing. °Incision starts to come apart. °Follow Up Appt °Call today for appointment in 3 weeks ([phone]) or for problems.  If you have any hardware placed in your spine, you will need an x-ray before your appointment. °  ° °

## 2013-03-19 NOTE — Progress Notes (Signed)
Orthopedic Tech Progress Note Patient Details:  [name] 03-20-1960 [id number]  Ortho Devices Type of Ortho Device: Soft collar Ortho Device/Splint Location: neck Ortho Device/Splint Interventions: Ordered;Application   Braulio Bosch 03/19/2013, 3:41 PM

## 2013-03-19 NOTE — H&P (Signed)
CHIEF COMPLAINT:                                                         Dysesthesias in the left hand, neck and shoulder pain on the left side.    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  She tells me that back in December of 2011 she developed some neck and shoulder symptoms mostly on the left side with some radiation out to the arm and some radiation into the thumb and index finger and long finger on that left hand.  Symptoms improved for a period of time but then in June of this past year things got considerably worse.  She notes that she did a lot of avoidance of activities that would aggravate the symptoms in the interim but when they got worse she notes she had more numbness and tingling even into the fourth digit on that left hand.  She was ultimately seen and evaluated by [facility].  She had been seen by Dr. Nelva Bush and had a couple of translaminar epidural steroid injections which did seem to help mitigate the pain in the shoulder and proximal portion of the neck to some degree.  The numbness and tingling in the fingers continued and EMG and nerve conduction studies were performed which demonstrate she has a mild carpal tunnel syndrome on the left side and also does not note any changes of a cervical radiculopathic nature.  MRI performed in June of this year demonstrates that she has a small foraminal disc protrusion on the left side at C6-7.  She has some degenerative changes in the disc at C5-6 but the foramen appear amply patent on both sides.  Since that time, she has had a sprain of her ankle which has largely kept her immobilized. During that time, she finds that a lot of her neck and shoulder symptoms have eased.  She still gets numbness and dysesthesias in the fingers but she has been concerned regarding what should be the further course of action regarding the treatment of this process.  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    PAST MEDICAL HISTORY:                                              Her general heath has been good.  She has some hypertension and some hypothyroidism.    CURRENT MEDICATIONS:                                             Toprol XL 100 mg. q.d., Thyroid 30 mg. q.d., Maxalt 10 mg. p.r.n., Ketoprofen 75 mg. p.r.n., Reglan p.r.n., Sherlyn Lees (?) q.d., Prolentra (?) b.i.d. for prevention of migraines, Feverfew 400 mg. b.i.d., B12-400 q.d., Calcium 600 mg. b.i.d., Adaptacin (?)  tab q.d., Flexeril p.r.n., Ambien  to 1 tab p.r.n., Ibuprofen p.r.n., fish oil, and vitamin D supplements. She notes an allergy to Effexor.    SOCIAL HISTORY:                                                              She does not smoke nor does she use any alcohol.  Height has been stable at 5' 2 " and 220 pounds.  PAST SURGICAL HISTORY:                                            Previous surgery includes a cholecystectomy, C-section and laparoscopy for an ectopic pregnancy.    Alicia Cardinal. Moore                               #[id number]               DOB:  07-05-1960                              January 29, 2012 Page 2  REVIEW OF SYSTEMS:                                     Notable for high blood pressure and history of migraines since 53 years of age.   PHYSICAL EXAMINATION:                                            On physical examination, I note that her range of motion allows her to turn freely to +70 degrees to either side.  When she turns to the left, she notes she gets some radicular pain into the left upper extremity.  She flexes and extends normally.  Axial compression doe not reproduce any overt pain.  Palpation of the supraclavicular fossa does not reproduce any acute pain.  Her motor function is good in the deltoids, biceps, triceps, but the wrist extensors are  intact.  Finger extensors are just ever so slight weak on that left side.  Grip strength is intact.  DTR's are 2+ in the biceps and triceps both.  Trace in the patella and 2+ in the Achilles on the right and 1+ on the left.  Vibratory sensation is intact distally in the upper extremities.    IMPRESSION:  since her initial visit she's had bilateral carpal tunnel releases. However she is still having considerable pain in the right shoulder and arm. A recent MRI recapitulates significant spondylosis at the C5-6 and C6-C7 levels with disc bulges off to the right side. She has been advised regarding decompression and arthroplasty at C5-6 and C6-C7. She is admitted for this procedure now.

## 2013-03-19 NOTE — Op Note (Signed)
Date of surgery: 05/17/2013 Preoperative diagnosis: Cervical spondylosis with radiculopathy C5-6 and C6-C7 Postoperative diagnosis: Cervical spondylosis with radiculopathy C5-6 and C6-C7 Procedure: Decompression of C5-6 and C6-C7 arthroplasty with Bryan disc C5-6 and C6-C7 Surgeon: Kristeen Miss Assistant: Jovita Gamma Anesthesia: Gen. endotracheal Indications: Near herrings 53 year old individual is had significant neck shoulder and right arm pain she has evidence of advanced spondylosis with subligamentous protrusion of the disc to the right side at C5-6 and C6-C7. She has undergone extensive conservative therapy for over a years period time. Having failed all this she is advised regarding a two-level arthroplasty at C5-6 C6-C7.  Procedure: The patient was brought to the operating room supine on a stretcher. After the smooth induction of general endotracheal anesthesia, the neck was prepped with alcohol DuraPrep. Fluoroscopic guidance was used to visualize the prevertebral region an incision was made over the interspace at C5-C6. Dissection was carried down on left side to the platysma. The plane between the sternocleidomastoid and strap muscles dissected bluntly. The prevertebral space was reached and the first identifiable disc space was noted to be that of C5-C6 with fluoroscopic imaging. Longus coli muscle was then stripped off either side of midline and a self-retaining Caspar type retractor was placed into the wound. The disc space was opened with a 15 blade. A significant quantity of severely degenerated disc material was removed from within the disc space. As the region of the posterior longitudinal ligament was reached on the right side there was identified subligamentous material that was removed using a 1 and 2 mm Kerrison punch the dissection was carried out laterally over the exiting nerve root C6. The ligament was opened on this region and further disc material degenerated material was  encountered in this area. Once a good decompression was carried out centrally to the right and to the left , distraction screws were placed in C5 and C6. The interspace was then distracted. Further inspection yielded no other residual fragments of disc material. An interbody sizer was then used and was felt that a 15 mm arthroplasty would fit best. A 15 mm cutting tool was attached. This was placed into the interspace and the endplates were drilled to form cupped recesses and C5 and C6. Once this was accomplished the arthroplasty device was placed into the interspace. It seated spontaneously. Radiographic confirmation identified the position of the spacer.  Attention was then turned to C6-C7. A discectomy was performed in the same fashion here and here subligamentous material was encountered centrally and off to the right side. This was removed without opening the ligament. Once the decompression was completed distraction screws were again placed in C6 and C7. Interspace was distracted. A 15 mm trial was again placed into the interspace and felt to be of appropriate size. A cutter was then used however we had noted that the first cutter that was used at stripped itself out and would not stand in the interspace second cutter was used in this likewise stripped itself out the third cutter was then finally used in this provided good machining surfaces at C6 and C7. The arthroplasty was then placed into the interspace and seat itself well. The distraction screws were removed. Gelfoam was placed in the holes to control bleeding from the distraction screws. The wound was carefully inspected and when hemostasis was achieved the cervical call space was inspected and the soft tissues and then the platysma was closed with 3-0 Vicryl in the subarticular skin was closed with 3-0 Vicryl also. Blood loss for  entire procedure was estimated at less than 50 cc. Final AP confirmation of the implants was obtained. Patient tolerated  procedure was returned to recovery room in stable condition.

## 2013-03-19 NOTE — Preoperative (Signed)
Beta Blockers   Reason not to administer Beta Blockers:Not Applicable 

## 2013-03-19 NOTE — Anesthesia Preprocedure Evaluation (Addendum)
Anesthesia Evaluation  Patient identified by MRN, date of birth, ID band Patient awake    Reviewed: Allergy & Precautions, H&P , NPO status , Patient's Chart, lab work & pertinent test results, reviewed documented beta blocker date and time   History of Anesthesia Complications (+) PONV and Family history of anesthesia reaction  Airway Mallampati: II TM Distance: >3 FB     Dental  (+) Dental Advisory Given   Pulmonary sleep apnea and Continuous Positive Airway Pressure Ventilation ,          Cardiovascular hypertension, Pt. on medications and Pt. on home beta blockers Rhythm:Regular Rate:Normal     Neuro/Psych  Headaches,    GI/Hepatic   Endo/Other  Hypothyroidism   Renal/GU      Musculoskeletal   Abdominal (+)  Abdomen: soft. Bowel sounds: normal.  Peds  Hematology   Anesthesia Other Findings   Reproductive/Obstetrics                         Anesthesia Physical Anesthesia Plan  ASA: III  Anesthesia Plan:    Post-op Pain Management:    Induction:   Airway Management Planned:   Additional Equipment:   Intra-op Plan:   Post-operative Plan:   Informed Consent:   Plan Discussed with:   Anesthesia Plan Comments:         Anesthesia Quick Evaluation

## 2013-03-19 NOTE — Discharge Summary (Signed)
Physician Discharge Summary  Patient ID: [name] MRN: [id number] DOB/AGE: 1960/12/14 53 y.o.  Admit date: 03/19/2013 Discharge date: 03/19/2013  Admission Diagnoses: Cervical spondylosis C5-6 C6-7 with radiculopathy on right.  Discharge Diagnoses: Cervical spondylosis C5-6 C6-C7 with radiculopathy on the Active Problems:   Cervical spondylosis   Discharged Condition: good  Hospital Course: Patient was omitted to undergo surgical decompression and arthroplasty at C5-6 and C6-C7 using a Bryan disc she tolerated procedure well  Consults: None  Significant Diagnostic Studies: None  Treatments: surgery: Anterior cervical decompression C5-6 and C6-7 arthroplasty with Bryan disc C5-6 C6-7, 15 mm size  Discharge Exam: Blood pressure 142/78, pulse 76, temperature 98.2 F (36.8 C), temperature source Oral, resp. rate 21, last menstrual period 03/02/2013, SpO2 97.00%. Motor function is intact in upper extremities incision is clean and dry swallowing is normal  Disposition:  discharge home  Discharge Orders   Future Orders Complete By Expires   Call MD for:  redness, tenderness, or signs of infection (pain, swelling, redness, odor or green/yellow discharge around incision site)  As directed    Call MD for:  severe uncontrolled pain  As directed    Call MD for:  temperature >100.4  As directed    Diet - low sodium heart healthy  As directed    Discharge instructions  As directed    Comments:     Okay to shower. Do not apply salves or appointments to incision. No heavy lifting with the upper extremities greater than 15 pounds. May resume driving when not requiring pain medication and patient feels comfortable with doing so.   Increase activity slowly  As directed        Medication List         baclofen 10 MG tablet  Commonly known as:  LIORESAL  Take 10 mg by mouth 2 (two) times daily as needed (migraines).     CALCIUM + D3 PO  Take 1 tablet by mouth 2 (two) times daily.      cetirizine 10 MG tablet  Commonly known as:  ZYRTEC  Take 10 mg by mouth at bedtime.     FEVERFEW PO  Take 1 tablet by mouth 2 (two) times daily.     Fish Oil 1200 MG Caps  Take 1,200 mg by mouth 1 day or 1 dose.     HYDROcodone-acetaminophen 5-325 MG per tablet  Commonly known as:  NORCO/VICODIN  Take 0.5-1 tablets by mouth every 6 (six) hours as needed for moderate pain.     ibuprofen 200 MG tablet  Commonly known as:  ADVIL,MOTRIN  Take 200 mg by mouth every 6 (six) hours as needed.     ketoprofen 75 MG capsule  Commonly known as:  ORUDIS  Take 75 mg by mouth 4 (four) times daily as needed for mild pain.     loratadine 10 MG tablet  Commonly known as:  CLARITIN  Take 10 mg by mouth daily.     meloxicam 7.5 MG tablet  Commonly known as:  MOBIC  Take 7.5 mg by mouth 2 (two) times daily.     methocarbamol 500 MG tablet  Commonly known as:  ROBAXIN  Take 500 mg by mouth every 6 (six) hours as needed for muscle spasms.     metoprolol succinate 100 MG 24 hr tablet  Commonly known as:  TOPROL-XL  Take 100 mg by mouth every evening. Take with or immediately following a meal.     OVER THE COUNTER MEDICATION  Take 2 tablets by mouth every evening. Prolent Supplement     oxyCODONE-acetaminophen 5-325 MG per tablet  Commonly known as:  ROXICET  Take 1-2 tablets by mouth every 4 (four) hours as needed for severe pain.     phenylephrine 10 MG Tabs tablet  Commonly known as:  SUDAFED PE  Take 10 mg by mouth every 4 (four) hours as needed.     ranitidine 150 MG tablet  Commonly known as:  ZANTAC  Take 150 mg by mouth 2 (two) times daily.     Riboflavin 400 MG Caps  Take 400 mg by mouth daily.     rizatriptan 10 MG tablet  Commonly known as:  MAXALT  Take 10 mg by mouth as needed. May repeat in 2 hours if needed     thyroid 30 MG tablet  Commonly known as:  ARMOUR  Take 30 mg by mouth daily.     Vitamin D-3 5000 UNITS Tabs  Take 5,000 Units by mouth every  other day.     zolpidem 10 MG tablet  Commonly known as:  AMBIEN  Take 5 mg by mouth at bedtime.         SignedEarleen Newport 03/19/2013, 3:48 PM

## 2013-03-19 NOTE — Transfer of Care (Signed)
Immediate Anesthesia Transfer of Care Note  Patient: [name]  Procedure(s) Performed: Procedure(s) with comments: CERVICAL FIVE TO SIX, CERVICAL SIX TO SEVEN CERVICAL ANTERIOR [place] (N/A) - C56 C67 artificial disc replacement  Patient Location: PACU  Anesthesia Type:General  Level of Consciousness: awake, alert  and oriented  Airway & Oxygen Therapy: Patient connected to face mask oxygen  Post-op Assessment: Report given to PACU RN  Post vital signs: stable  Complications: No apparent anesthesia complications

## 2013-03-19 NOTE — Plan of Care (Signed)
Problem: Consults Goal: Diagnosis - Spinal Surgery Outcome: Completed/Met Date Met:  03/19/13 Cervical Spine Fusion

## 2013-03-19 NOTE — Anesthesia Postprocedure Evaluation (Signed)
Anesthesia Post Note  Patient: [name]  Procedure(s) Performed: Procedure(s) (LRB): CERVICAL FIVE TO SIX, CERVICAL SIX TO SEVEN CERVICAL ANTERIOR [place] ARTHROPLASTY (N/A)  Anesthesia type: general  Patient location: PACU  Post pain: Pain level controlled  Post assessment: Patient's Cardiovascular Status Stable  Last Vitals:  Filed Vitals:   03/19/13 1620  BP: 156/75  Pulse: 81  Temp: 36.7 C  Resp: 20    Post vital signs: Reviewed and stable  Level of consciousness: sedated  Complications: No apparent anesthesia complications

## 2013-03-19 NOTE — Anesthesia Procedure Notes (Addendum)
Procedure Name: Intubation Date/Time: 03/19/2013 11:58 AM Performed by: Scheryl Darter Pre-anesthesia Checklist: Patient identified, Emergency Drugs available, Suction available, Patient being monitored and Timeout performed Patient Re-evaluated:Patient Re-evaluated prior to inductionOxygen Delivery Method: Circle system utilized Preoxygenation: Pre-oxygenation with 100% oxygen Intubation Type: IV induction Ventilation: Mask ventilation without difficulty Laryngoscope Size: Miller and 2 Grade View: Grade II Tube type: Oral Number of attempts: 1 Airway Equipment and Method: Stylet Placement Confirmation: ETT inserted through vocal cords under direct vision,  breath sounds checked- equal and bilateral and positive ETCO2 Secured at: 22 cm Tube secured with: Tape Dental Injury: Teeth and Oropharynx as per pre-operative assessment

## 2013-03-19 NOTE — Progress Notes (Signed)
Patient ID: [name], female   DOB: 1960-12-26, 53 y.o.   MRN: [id number] Signs are stable. Patient feels well. Motor function is intact. Incision is clean and dry. Stable postop either discharge this evening or in a.m.

## 2013-03-20 NOTE — Progress Notes (Signed)
Utilization Review Completed.    , RN, BSN Nurse Case Manager  

## 2013-03-20 NOTE — Evaluation (Signed)
Occupational Therapy Evaluation Patient Details Name: [name] MRN: [id number] DOB: 01-15-1961 Today's Date: 03/20/2013 Time: 6789-3810 OT Time Calculation (min): 47 min  OT Assessment / Plan / Recommendation History of present illness 53 yo female s/p C5-7 decompression and arthoplasty with soft collar for comfort   Clinical Impression   Patient evaluated by Occupational Therapy with no further acute OT needs identified. All education has been completed and the patient has no further questions. See below for any follow-up Occupational Therapy or equipment needs. OT to sign off. Thank you for referral.      OT Assessment  Patient does not need any further OT services    Follow Up Recommendations  No OT follow up    Barriers to Discharge      Equipment Recommendations  None recommended by OT    Recommendations for Other Services    Frequency       Precautions / Restrictions Precautions Precautions: Cervical Required Braces or Orthoses: Cervical Brace Cervical Brace: Soft collar;For comfort Restrictions Weight Bearing Restrictions: No   Pertinent Vitals/Pain Premedicated None needed at this time   ADL  Eating/Feeding: Independent Where Assessed - Eating/Feeding: Chair Grooming: Wash/dry hands;Wash/dry face;Teeth care;Supervision/safety Where Assessed - Grooming: Unsupported standing (cued to use cups) Upper Body Bathing: Chest;Right arm;Left arm;Abdomen;Supervision/safety Where Assessed - Upper Body Bathing: Unsupported sitting Upper Body Dressing: Supervision/safety Where Assessed - Upper Body Dressing: Unsupported sitting Lower Body Dressing: Supervision/safety Where Assessed - Lower Body Dressing: Unsupported sit to stand Toilet Transfer: Supervision/safety Toilet Transfer Method: Sit to Loss adjuster, chartered: Regular height toilet Tub/Shower Transfer: Supervision/safety Tub/Shower Transfer Method: Therapist, art: Walk  in shower Transfers/Ambulation Related to ADLs: pt ambulating without physical (A) required. Adequate level for d/c home ADL Comments: Pt educated with handout for cervical precautions with dressing. pt is able to cross bil LE , Pt able to don/ doff shirt with good demo, able to groom sink level, able to complete bed mobility, educated on bil ue support in sitting with pillows and don doff cervical brace. Pt and spouse with questions and all education complete at this time.     OT Diagnosis:    OT Problem List:   OT Treatment Interventions:     OT Goals(Current goals can be found in the care plan section)    Visit Information  Last OT Received On: 03/20/13 Assistance Needed: +1 History of Present Illness: 53 yo female s/p C5-7 decompression and arthoplasty with soft collar for comfort       Prior [place] expects to be discharged to:: Private residence Living Arrangements: Spouse/significant other;Children Available Help at Discharge: Family;Available 24 hours/day Type of Home: House Home Access: Stairs to enter [employer] of Steps: 4 Entrance Stairs-Rails: Right;Left Home Layout: Two level;Able to live on main level with bedroom/bathroom Home Equipment: None Additional Comments: has x2 sons at home in addition to spouse that can help Prior Function Level of Independence: Independent Communication Communication: No difficulties Dominant Hand: Right         Vision/Perception Vision - History Baseline Vision: No visual deficits Patient Visual Report: No change from baseline Perception Perception: Within Functional Limits Praxis Praxis: Intact   Cognition  Cognition Arousal/Alertness: Awake/alert Behavior During Therapy: WFL for tasks assessed/performed Overall Cognitive Status: Within Functional Limits for tasks assessed    Extremity/Trunk Assessment Upper Extremity Assessment Upper Extremity Assessment: Overall WFL  for tasks assessed Lower Extremity Assessment Lower Extremity Assessment: Defer to PT evaluation  Cervical / Trunk Assessment Cervical / Trunk Assessment: Other exceptions (surg)     Mobility Bed Mobility Overal bed mobility: Modified Independent General bed mobility comments: Verbal instructions for technique for bed mobility.  Patient able to roll and move to sitting without physical assist - required increased time.  Assist to don cervical collar in sitting. Transfers Overall transfer level: Needs assistance Equipment used: None Transfers: Sit to/from Stand Sit to Stand: Supervision General transfer comment: Verbal cues for proper technique.  Patient slightly unsteady initially in standing - improved with time.     Exercise     Balance Balance Overall balance assessment: Needs assistance Standing balance comment: static standing supervision during grooming task. cues to prevent neck flexion   End of Session OT - End of Session Activity Tolerance: Patient tolerated treatment well Patient left: in chair;with call bell/phone within reach;with family/visitor present (eating breakfast) Nurse Communication: Mobility status;Precautions  GO Functional Assessment Tool Used: clinical judgement Functional Limitation: Self care Self Care Current Status (W1191): At least 1 percent but less than 20 percent impaired, limited or restricted Self Care Goal Status (Y7829): At least 1 percent but less than 20 percent impaired, limited or restricted Self Care Discharge Status [phone]): At least 1 percent but less than 20 percent impaired, limited or restricted   Peri Maris 03/20/2013, 9:35 AM Pager: [phone]

## 2013-03-20 NOTE — Evaluation (Signed)
Physical Therapy Evaluation Patient Details Name: [name] MRN: [id number] DOB: Jun 16, 1960 Today's Date: 03/20/2013 Time: 9485-4627 PT Time Calculation (min): 19 min  PT Assessment / Plan / Recommendation History of Present Illness  Patient is a 53 yo female admitted with Cervical spondylosis C5-6 C6-7 with radiculopathy.  Now s/p Decompression of C5-6 and C6-C7 arthroplasty with Bryan disc C5-6 and C6-C7  Clinical Impression  Patient able to perform mobility and gait with supervision only.  Able to negotiate stairs with min guard assist.  Provided patient and husband with cervical precaution/mobility education.  Patient safe for discharge from PT perspective.  No f/u PT needs.    PT Assessment  Patent does not need any further PT services    Follow Up Recommendations  No PT follow up;Supervision for mobility/OOB    Does the patient have the potential to tolerate intense rehabilitation      Barriers to Discharge        Equipment Recommendations  None recommended by PT    Recommendations for Other Services     Frequency      Precautions / Restrictions Precautions Precautions: Cervical Required Braces or Orthoses: Cervical Brace Cervical Brace: Soft collar;For comfort Restrictions Weight Bearing Restrictions: No   Pertinent Vitals/Pain Pain 6/10 impacting mobility.      Mobility  Bed Mobility Overal bed mobility: Modified Independent General bed mobility comments: Verbal instructions for technique for bed mobility.  Patient able to roll and move to sitting without physical assist - required increased time.  Assist to don cervical collar in sitting. Transfers Overall transfer level: Needs assistance Equipment used: None Transfers: Sit to/from Stand Sit to Stand: Supervision General transfer comment: Verbal cues for proper technique.  Patient slightly unsteady initially in standing - improved with time. Ambulation/Gait Ambulation/Gait assistance:  Supervision Ambulation Distance (Feet): 200 Feet Assistive device: None Gait Pattern/deviations: Step-through pattern;Decreased stride length Gait velocity: Slow gait speed General Gait Details: Verbal cues to maintain cervical precautions during gait.  No physical assist needed. Stairs: Yes Stairs assistance: Min guard Stair Management: One rail Left;Step to pattern;Forwards Number of Stairs: 4 General stair comments: Instructed patient and spouse safe negotiation of stairs using step-to pattern and 1 rail.  Instructed husband on proper way to guard patient on stairs for safety.        PT Goals(Current goals can be found in the care plan section)    Visit Information  Last PT Received On: 03/20/13 Assistance Needed: +1 History of Present Illness: Patient is a 53 yo female admitted with Cervical spondylosis C5-6 C6-7 with radiculopathy.  Now s/p Decompression of C5-6 and C6-C7 arthroplasty with Bryan disc C5-6 and C6-C7       Prior [place] expects to be discharged to:: Private residence Living Arrangements: Spouse/significant other;Children Available Help at Discharge: Family;Available 24 hours/day Type of Home: House Home Access: Stairs to enter [employer] of Steps: 4 Entrance Stairs-Rails: Right;Left Home Layout: Two level;Able to live on main level with bedroom/bathroom Home Equipment: None Prior Function Level of Independence: Independent Communication Communication: No difficulties    Cognition  Cognition Arousal/Alertness: Awake/alert Behavior During Therapy: WFL for tasks assessed/performed Overall Cognitive Status: Within Functional Limits for tasks assessed    Extremity/Trunk Assessment Upper Extremity Assessment Upper Extremity Assessment: Defer to OT evaluation Lower Extremity Assessment Lower Extremity Assessment: Overall WFL for tasks assessed   Balance    End of Session PT - End of Session Equipment  Utilized During Treatment: Gait belt;Cervical collar Activity  Tolerance: Patient tolerated treatment well Patient left: in bed;with call bell/phone within reach;with family/visitor present (sitting EOB) Nurse Communication: Mobility status  GP Functional Assessment Tool Used: Clinical judgement Functional Limitation: Mobility: Walking and moving around Mobility: Walking and Moving Around Current Status (H6314): At least 1 percent but less than 20 percent impaired, limited or restricted Mobility: Walking and Moving Around Goal Status [phone]): At least 1 percent but less than 20 percent impaired, limited or restricted Mobility: Walking and Moving Around Discharge Status [phone]): At least 1 percent but less than 20 percent impaired, limited or restricted   Despina Pole 03/20/2013, 9:21 AM Carita Pian. Sanjuana Kava, [place] Pager [phone]

## 2013-03-20 NOTE — Progress Notes (Signed)
Pt. Alert and oriented, follows simple instructions, denies pain. Incision area without swelling, redness or S/S of infection. Voiding adequate clear yellow urine. Moving all extremities well and vitals stable and documented. Anterior Cervical Fusion surgery notes instructions given to patient and family member for home safety and precautions. Pt. and family stated understanding of instructions given. Pain meds given per Pt.'s request for pain and discomfort of ride home

## 2013-03-22 ENCOUNTER — Encounter (HOSPITAL_COMMUNITY): Payer: Self-pay | Admitting: Neurological Surgery

## 2013-03-24 NOTE — Addendum Note (Signed)
Addendum created 03/24/13 0744 by Lillia Abed, MD   Modules edited: Anesthesia Responsible Staff

## 2013-07-07 ENCOUNTER — Other Ambulatory Visit: Payer: Self-pay | Admitting: Neurological Surgery

## 2013-07-07 DIAGNOSIS — M5000 Cervical disc disorder with myelopathy, unspecified cervical region: Secondary | ICD-10-CM

## 2013-08-04 ENCOUNTER — Ambulatory Visit
Admission: RE | Admit: 2013-08-04 | Discharge: 2013-08-04 | Disposition: A | Discharge status: Home / Self Care | Payer: 59 | Source: Ambulatory Visit | Attending: Neurological Surgery | Admitting: Neurological Surgery

## 2013-08-04 DIAGNOSIS — M5000 Cervical disc disorder with myelopathy, unspecified cervical region: Secondary | ICD-10-CM

## 2013-11-18 ENCOUNTER — Encounter (HOSPITAL_COMMUNITY): Payer: Self-pay | Admitting: Pharmacist

## 2013-11-18 NOTE — H&P (Signed)
NAME:  [name], [name]                    ACCOUNT NO.:  MEDICAL RECORD NO.:  [id number]  LOCATION:                                 FACILITY:  PHYSICIAN:  Ralene Bathe. Matthew Saras, M.D.DATE OF BIRTH:  [birth year]  DATE OF ADMISSION: DATE OF DISCHARGE:                             HISTORY & PHYSICAL   CHIEF COMPLAINT:  Menorrhagia.  HPI:  A 53 year old, perimenopausal G4, P3 prior tubal who was seen recently for her annual exam with complaints of menorrhagia.  SHT performed in our office demonstrated 2 well-defined polyps.  FSH was 6.3.  On ultrasound, her adnexa were unremarkable.  We discussed several options including D and C, hysteroscopy with Truclear resection versus adding and ablation, she does not want to do the latter.  This procedure including specific risks related to bleeding infection other complications such as perforation that may require additional surgery discussed with her which she understands and accepts.  PAST MEDICAL HISTORY:  Medications:  Armour Thyroid 30 mg daily, metoprolol 100 mg daily, Maxalt p.r.n., ketoprofen p.r.n., baclofen 20 mg p.r.n. headache, Ambien 10 mg p.o. at bedtime, ibuprofen 800 mg p.o. q.8 hours p.r.n.,  Robaxin 500 mg daily p.r.n. shoulder pain, Mobic 7.5 p.r.n. shoulder pain. Allergies:  EFFEXOR.  SURGICAL HISTORY:  Cesarean section x3, cholecystectomy, tonsillectomy, treatment of ectopic pregnancy.  REVIEW OF SYSTEMS:  Significant for headache, thyroid disease, UTI, hypertension.  FAMILY HISTORY:  Significant for headache, heart disease, hypertension.  SOCIAL HISTORY:  Denies alcohol, tobacco, or drug use.  She is married. Dr. Virgina Jock is her medical doctor.  PHYSICAL EXAMINATION:  VITAL SIGNS:  Temp 98.2, blood pressure 140/90. HEENT:  Unremarkable. NECK:  Supple without masses. LUNGS:  Clear. CARDIOVASCULAR:  Regular rate and rhythm without murmurs, rubs, or gallops.  BREASTS:  Without masses. ABDOMEN:  Soft, flat, nontender.   Vulva, vagina, and cervix normal. Uterus mid position, normal size.  Adnexa negative. EXTREMITIES:  Unremarkable. NEUROLOGIC:  Unremarkable.  IMPRESSION:  Menorrhagia, endometrial polyps.  PLAN:  D and C, hysteroscopy, with Truclear resection.  Procedure and risks discussed as above.      M. Matthew Saras, M.D.     RMH/MEDQ  D:  11/18/2013  T:  11/18/2013  Job:  [id number]

## 2013-11-18 NOTE — H&P (Signed)
[name]  DICTATION # [id number] CSN# [id number]   Margarette Asal, MD 11/18/2013 9:30 AM

## 2013-11-22 ENCOUNTER — Encounter (HOSPITAL_COMMUNITY): Payer: Self-pay

## 2013-11-22 ENCOUNTER — Encounter (HOSPITAL_COMMUNITY)
Admission: RE | Admit: 2013-11-22 | Discharge: 2013-11-22 | Disposition: A | Discharge status: Home / Self Care | Payer: 59 | Source: Ambulatory Visit | Attending: Obstetrics and Gynecology | Admitting: Obstetrics and Gynecology

## 2013-11-22 DIAGNOSIS — N920 Excessive and frequent menstruation with regular cycle: Secondary | ICD-10-CM | POA: Diagnosis not present

## 2013-11-22 DIAGNOSIS — G43909 Migraine, unspecified, not intractable, without status migrainosus: Secondary | ICD-10-CM | POA: Diagnosis not present

## 2013-11-22 DIAGNOSIS — N840 Polyp of corpus uteri: Secondary | ICD-10-CM | POA: Diagnosis not present

## 2013-11-22 DIAGNOSIS — Z6835 Body mass index (BMI) 35.0-35.9, adult: Secondary | ICD-10-CM | POA: Diagnosis not present

## 2013-11-22 DIAGNOSIS — G4730 Sleep apnea, unspecified: Secondary | ICD-10-CM | POA: Diagnosis not present

## 2013-11-22 DIAGNOSIS — I10 Essential (primary) hypertension: Secondary | ICD-10-CM | POA: Diagnosis not present

## 2013-11-22 DIAGNOSIS — M1389 Other specified arthritis, multiple sites: Secondary | ICD-10-CM | POA: Diagnosis not present

## 2013-11-22 DIAGNOSIS — N938 Other specified abnormal uterine and vaginal bleeding: Secondary | ICD-10-CM | POA: Diagnosis not present

## 2013-11-22 HISTORY — DX: Lumbago with sciatica, unspecified side: M54.40

## 2013-11-22 LAB — BASIC METABOLIC PANEL
Anion gap: 8 (ref 5–15)
BUN: 16 mg/dL (ref 6–23)
CO2: 28 meq/L (ref 19–32)
CREATININE: 0.61 mg/dL (ref 0.50–1.10)
Calcium: 9.1 mg/dL (ref 8.4–10.5)
Chloride: 101 mEq/L (ref 96–112)
GFR calc Af Amer: >90 mL/min (ref >90)
GFR calc non Af Amer: >90 mL/min (ref >90)
GLUCOSE: 86 mg/dL (ref 70–99)
Potassium: 4.4 mEq/L (ref 3.7–5.3)
Sodium: 137 mEq/L (ref 137–147)

## 2013-11-22 LAB — CBC
HEMATOCRIT: 42.3 % (ref 36.0–46.0)
HEMOGLOBIN: 14.6 g/dL (ref 12.0–15.0)
MCH: 29.7 pg (ref 26.0–34.0)
MCHC: 34.5 g/dL (ref 30.0–36.0)
MCV: 86.2 fL (ref 78.0–100.0)
Platelets: 220 10*3/uL (ref 150–400)
RBC: 4.91 MIL/uL (ref 3.87–5.11)
RDW: 12.9 % (ref 11.5–15.5)
WBC: 6.2 10*3/uL (ref 4.0–10.5)

## 2013-11-22 NOTE — Patient Instructions (Addendum)
Your procedure is scheduled on:11/25/13  Enter through the Main Entrance at :44 am Pick up desk phone and dial [phone] and inform us of your arrival.  Please call [phone] if you have any problems the morning of surgery.  Remember: Do not eat food or drink liquids, including water, after midnight: Wed Clear liquids are ok until: 9am on Thursday  You may brush your teeth the morning of surgery.  Take these meds the morning of surgery with a sip of water:Thyroid med  DO NOT wear jewelry, eye make-up, lipstick,body lotion, or dark fingernail polish.  (Polished toes are ok) You may wear deodorant.  If you are to be admitted after surgery, leave suitcase in car until your room has been assigned. Patients discharged on the day of surgery will not be allowed to drive home. Wear loose fitting, comfortable clothes for your ride home.

## 2013-11-25 ENCOUNTER — Ambulatory Visit (HOSPITAL_COMMUNITY): Payer: 59 | Admitting: Anesthesiology

## 2013-11-25 ENCOUNTER — Ambulatory Visit (HOSPITAL_COMMUNITY)
Admission: RE | Admit: 2013-11-25 | Discharge: 2013-11-25 | Disposition: A | Discharge status: Home / Self Care | Payer: 59 | Source: Ambulatory Visit | Attending: Obstetrics and Gynecology | Admitting: Obstetrics and Gynecology

## 2013-11-25 ENCOUNTER — Encounter (HOSPITAL_COMMUNITY)
Admission: RE | Disposition: A | Discharge status: Home / Self Care | Payer: Self-pay | Source: Ambulatory Visit | Attending: Obstetrics and Gynecology

## 2013-11-25 ENCOUNTER — Encounter (HOSPITAL_COMMUNITY): Payer: Self-pay | Admitting: Certified Registered Nurse Anesthetist

## 2013-11-25 DIAGNOSIS — N840 Polyp of corpus uteri: Secondary | ICD-10-CM | POA: Insufficient documentation

## 2013-11-25 DIAGNOSIS — I10 Essential (primary) hypertension: Secondary | ICD-10-CM | POA: Insufficient documentation

## 2013-11-25 DIAGNOSIS — M1389 Other specified arthritis, multiple sites: Secondary | ICD-10-CM | POA: Insufficient documentation

## 2013-11-25 DIAGNOSIS — N920 Excessive and frequent menstruation with regular cycle: Secondary | ICD-10-CM | POA: Insufficient documentation

## 2013-11-25 DIAGNOSIS — G4730 Sleep apnea, unspecified: Secondary | ICD-10-CM | POA: Insufficient documentation

## 2013-11-25 DIAGNOSIS — G43909 Migraine, unspecified, not intractable, without status migrainosus: Secondary | ICD-10-CM | POA: Insufficient documentation

## 2013-11-25 DIAGNOSIS — Z6835 Body mass index (BMI) 35.0-35.9, adult: Secondary | ICD-10-CM | POA: Insufficient documentation

## 2013-11-25 DIAGNOSIS — N938 Other specified abnormal uterine and vaginal bleeding: Secondary | ICD-10-CM | POA: Insufficient documentation

## 2013-11-25 HISTORY — PX: DILATATION & CURETTAGE/HYSTEROSCOPY WITH TRUECLEAR: SHX6353

## 2013-11-25 LAB — PREGNANCY, URINE: Preg Test, Ur: NEGATIVE

## 2013-11-25 SURGERY — DILATATION & CURETTAGE/HYSTEROSCOPY WITH TRUCLEAR
Anesthesia: Monitor Anesthesia Care | Site: Vagina

## 2013-11-25 MED ORDER — FENTANYL CITRATE 0.05 MG/ML IJ SOLN
INTRAMUSCULAR | Status: DC | PRN
  Administered 2013-11-25: 50 ug via INTRAVENOUS

## 2013-11-25 MED ORDER — MIDAZOLAM HCL 2 MG/2ML IJ SOLN
INTRAMUSCULAR | Status: AC
  Filled 2013-11-25: qty 2

## 2013-11-25 MED ORDER — ONDANSETRON HCL 4 MG/2ML IJ SOLN
INTRAMUSCULAR | Status: AC
  Filled 2013-11-25: qty 2

## 2013-11-25 MED ORDER — PROMETHAZINE HCL 25 MG/ML IJ SOLN: 6.2500 mg | INTRAMUSCULAR | Status: DC | PRN

## 2013-11-25 MED ORDER — HYDROCODONE-ACETAMINOPHEN 5-325 MG PO TABS
ORAL_TABLET | ORAL | Status: AC
  Filled 2013-11-25: qty 1

## 2013-11-25 MED ORDER — LIDOCAINE HCL 1 % IJ SOLN
INTRAMUSCULAR | Status: DC | PRN
  Administered 2013-11-25: 8 mL

## 2013-11-25 MED ORDER — HYDROCODONE-IBUPROFEN 7.5-200 MG PO TABS: 1.0000 | ORAL_TABLET | Freq: Three times a day (TID) | ORAL | Status: DC | PRN

## 2013-11-25 MED ORDER — KETOROLAC TROMETHAMINE 30 MG/ML IJ SOLN
INTRAMUSCULAR | Status: AC
  Administered 2013-11-25: 30 mg via INTRAVENOUS
  Filled 2013-11-25: qty 1

## 2013-11-25 MED ORDER — SCOPOLAMINE 1 MG/3DAYS TD PT72
1.0000 | MEDICATED_PATCH | Freq: Once | TRANSDERMAL | Status: DC
  Administered 2013-11-25: 1.5 mg via TRANSDERMAL

## 2013-11-25 MED ORDER — DEXAMETHASONE SODIUM PHOSPHATE 4 MG/ML IJ SOLN
INTRAMUSCULAR | Status: AC
  Filled 2013-11-25: qty 1

## 2013-11-25 MED ORDER — FENTANYL CITRATE 0.05 MG/ML IJ SOLN
INTRAMUSCULAR | Status: AC
  Filled 2013-11-25: qty 5

## 2013-11-25 MED ORDER — PROPOFOL 10 MG/ML IV BOLUS
INTRAVENOUS | Status: DC | PRN
  Administered 2013-11-25: 50 mg via INTRAVENOUS
  Administered 2013-11-25 (×3): 20 mg via INTRAVENOUS

## 2013-11-25 MED ORDER — LIDOCAINE HCL 1 % IJ SOLN
INTRAMUSCULAR | Status: AC
  Filled 2013-11-25: qty 20

## 2013-11-25 MED ORDER — HYDROCODONE-ACETAMINOPHEN 5-325 MG PO TABS
1.0000 | ORAL_TABLET | Freq: Once | ORAL | Status: AC
  Administered 2013-11-25: 1 via ORAL

## 2013-11-25 MED ORDER — LIDOCAINE IN DEXTROSE 5-7.5 % IV SOLN
INTRAVENOUS | Status: DC | PRN
  Administered 2013-11-25: 1 mL via INTRATHECAL

## 2013-11-25 MED ORDER — SCOPOLAMINE 1 MG/3DAYS TD PT72
MEDICATED_PATCH | TRANSDERMAL | Status: AC
  Administered 2013-11-25: 1.5 mg via TRANSDERMAL
  Filled 2013-11-25: qty 1

## 2013-11-25 MED ORDER — DEXAMETHASONE SODIUM PHOSPHATE 10 MG/ML IJ SOLN
INTRAMUSCULAR | Status: DC | PRN
  Administered 2013-11-25: 4 mg via INTRAVENOUS

## 2013-11-25 MED ORDER — LIDOCAINE IN DEXTROSE 5-7.5 % IV SOLN
INTRAVENOUS | Status: AC
  Filled 2013-11-25: qty 2

## 2013-11-25 MED ORDER — KETOROLAC TROMETHAMINE 30 MG/ML IJ SOLN
15.0000 mg | Freq: Once | INTRAMUSCULAR | Status: AC | PRN
  Administered 2013-11-25: 30 mg via INTRAVENOUS

## 2013-11-25 MED ORDER — SODIUM CHLORIDE 0.9 % IR SOLN
Status: DC | PRN
  Administered 2013-11-25: 3000 mL

## 2013-11-25 MED ORDER — PROPOFOL 10 MG/ML IV EMUL
INTRAVENOUS | Status: AC
  Filled 2013-11-25: qty 20

## 2013-11-25 MED ORDER — FENTANYL CITRATE 0.05 MG/ML IJ SOLN
INTRAMUSCULAR | Status: AC
Start: 2013-11-25 — End: 2013-11-25
  Administered 2013-11-25: 25 ug via INTRAVENOUS
  Filled 2013-11-25: qty 2

## 2013-11-25 MED ORDER — LACTATED RINGERS IV SOLN
INTRAVENOUS | Status: DC
  Administered 2013-11-25 (×3): via INTRAVENOUS

## 2013-11-25 MED ORDER — MIDAZOLAM HCL 2 MG/2ML IJ SOLN
INTRAMUSCULAR | Status: DC | PRN
  Administered 2013-11-25: 2 mg via INTRAVENOUS

## 2013-11-25 MED ORDER — FENTANYL CITRATE 0.05 MG/ML IJ SOLN
25.0000 ug | INTRAMUSCULAR | Status: DC | PRN
  Administered 2013-11-25: 50 ug via INTRAVENOUS
  Administered 2013-11-25: 25 ug via INTRAVENOUS

## 2013-11-25 MED ORDER — ONDANSETRON HCL 4 MG/2ML IJ SOLN
INTRAMUSCULAR | Status: DC | PRN
  Administered 2013-11-25: 4 mg via INTRAVENOUS

## 2013-11-25 MED ORDER — MEPERIDINE HCL 25 MG/ML IJ SOLN
6.2500 mg | INTRAMUSCULAR | Status: DC | PRN
Start: 2013-11-25 — End: 2013-11-25

## 2013-11-25 MED ORDER — IBUPROFEN 200 MG PO TABS: 200.0000 mg | ORAL_TABLET | Freq: Four times a day (QID) | ORAL | Status: DC | PRN

## 2013-11-25 SURGICAL SUPPLY — 14 items
BLADE INCISOR TRUC PLUS 2.9 (ABLATOR) IMPLANT
CANISTERS HI-FLOW 3000CC (CANNISTER) ×6 IMPLANT
CATH ROBINSON RED A/P 16FR (CATHETERS) ×3 IMPLANT
CLOTH BEACON ORANGE TIMEOUT ST (SAFETY) ×3 IMPLANT
CONTAINER PREFILL 10% NBF 60ML (FORM) ×4 IMPLANT
GLOVE BIO SURGEON STRL SZ7 (GLOVE) ×8 IMPLANT
GOWN STRL REUS W/TWL LRG LVL3 (GOWN DISPOSABLE) ×6 IMPLANT
INCISOR TRUC PLUS BLADE 2.9 (ABLATOR) ×3
KIT HYSTEROSCOPY TRUCLEAR (ABLATOR) ×2 IMPLANT
MORCELLATOR RECIP TRUCLEAR 4.0 (ABLATOR) IMPLANT
PACK VAGINAL MINOR WOMEN LF (CUSTOM PROCEDURE TRAY) ×3 IMPLANT
PAD OB MATERNITY 4.3X12.25 (PERSONAL CARE ITEMS) ×3 IMPLANT
TOWEL OR 17X24 6PK STRL BLUE (TOWEL DISPOSABLE) ×6 IMPLANT
WATER STERILE IRR 1000ML POUR (IV SOLUTION) ×3 IMPLANT

## 2013-11-25 NOTE — Discharge Instructions (Signed)
DISCHARGE INSTRUCTIONS: HYSTEROSCOPY / ENDOMETRIAL ABLATION The following instructions have been prepared to help you care for yourself upon your return home.  Personal hygiene:  Use sanitary pads for vaginal drainage, not tampons.  Shower the day after your procedure.  NO tub baths, pools or Jacuzzis for 2-3 weeks.  Wipe front to back after using the bathroom.  Activity and limitations:  Do NOT drive or operate any equipment for 24 hours. The effects of anesthesia are still present and drowsiness may result.  Do NOT rest in bed all day.  Walking is encouraged.  Walk up and down stairs slowly.  You may resume your normal activity in one to two days or as indicated by your physician. Sexual activity: NO intercourse for at least 2 weeks after the procedure, or as indicated by your Doctor.  Diet: Eat a light meal as desired this evening. You may resume your usual diet tomorrow.  Return to Work: You may resume your work activities in one to two days or as indicated by Marine scientist.  What to expect after your surgery: Expect to have vaginal bleeding/discharge for 2-3 days and spotting for up to 10 days. It is not unusual to have soreness for up to 1-2 weeks. You may have a slight burning sensation when you urinate for the first day. Mild cramps may continue for a couple of days. You may have a regular period in 2-6 weeks.  NO IBUPROFEN PRODUCTS (MOTRIN, ADVIL) OR ALEVE UNTIL 9:00PM TONIGHT.   Call your doctor for any of the following:  Excessive vaginal bleeding or clotting, saturating and changing one pad every hour.  Inability to urinate 6 hours after discharge from hospital.  Pain not relieved by pain medication.  Fever of 100.4 F or greater.  Unusual vaginal discharge or odor.  Return to office _________________Call for an appointment ___________________ Patients signature: ______________________ Nurses signature ________________________  [place]  [facility] [phone]

## 2013-11-25 NOTE — Transfer of Care (Signed)
Immediate Anesthesia Transfer of Care Note  Patient: [name]  Procedure(s) Performed: Procedure(s): DILATATION & CURETTAGE/HYSTEROSCOPY WITH TRUCLEAR (N/A)  Patient Location: PACU  Anesthesia Type:Spinal  Level of Consciousness: awake, alert  and oriented  Airway & Oxygen Therapy: Patient Spontanous Breathing and Patient connected to nasal cannula oxygen  Post-op Assessment: Report given to PACU RN and Post -op Vital signs reviewed and stable  Post vital signs: Reviewed and stable  Complications: No apparent anesthesia complications

## 2013-11-25 NOTE — Anesthesia Postprocedure Evaluation (Signed)
  Anesthesia Post-op Note  Patient: [name] Overall  Procedure(s) Performed: Procedure(s): DILATATION & CURETTAGE/HYSTEROSCOPY WITH TRUCLEAR (N/A) Patient is awake and responsive. Pain and nausea are reasonably well controlled. Vital signs are stable and clinically acceptable. Oxygen saturation is clinically acceptable. There are no apparent anesthetic complications at this time. Patient is ready for discharge.

## 2013-11-25 NOTE — Anesthesia Procedure Notes (Signed)
Spinal Patient location during procedure: OR Start time: 11/25/2013 1:26 PM Staffing Anesthesiologist: CASSIDY, AMY Performed by: anesthesiologist  Preanesthetic Checklist Completed: patient identified, site marked, surgical consent, pre-op evaluation, timeout performed, IV checked, risks and benefits discussed and monitors and equipment checked Spinal Block Patient position: sitting Prep: site prepped and draped and DuraPrep Patient monitoring: blood pressure, continuous pulse ox and heart rate Approach: midline Location: L4-5 Injection technique: single-shot Needle Needle type: Pencan  Needle gauge: 24 G Needle length: 10 cm Additional Notes Clear free flow CSF on first attempt.  Right paresthesia, transient.  Patient tolerated procedure well with no apparent complications.  Charlton Haws, MD

## 2013-11-25 NOTE — Progress Notes (Signed)
The patient was re-examined with no change in status 

## 2013-11-25 NOTE — Op Note (Signed)
[name]  DICTATION # [id number] CSN# [id number]   Margarette Asal, MD 11/25/2013 1:47 PM

## 2013-11-25 NOTE — Anesthesia Preprocedure Evaluation (Addendum)
Anesthesia Evaluation  Patient identified by MRN, date of birth, ID band Patient awake    Reviewed: Allergy & Precautions, H&P , NPO status , Patient's Chart, lab work & pertinent test results, reviewed documented beta blocker date and time   History of Anesthesia Complications (+) PONV  Airway Mallampati: II  TM Distance: <3 FB Neck ROM: full    Dental  (+) Teeth Intact   Pulmonary sleep apnea and Continuous Positive Airway Pressure Ventilation ,  breath sounds clear to auscultation  Pulmonary exam normal       Cardiovascular hypertension, On Home Beta Blockers Rhythm:regular Rate:Normal     Neuro/Psych  Headaches (migraines - last was yesterday (weekly)), negative psych ROS   GI/Hepatic negative GI ROS, Neg liver ROS,   Endo/Other  Hypothyroidism Morbid obesity  Renal/GU negative Renal ROS  Female GU complaint     Musculoskeletal  (+) Arthritis -, Neck and back pain   Abdominal   Peds  Hematology negative hematology ROS (+)   Anesthesia Other Findings   Reproductive/Obstetrics negative OB ROS                           Anesthesia Physical Anesthesia Plan  ASA: III  Anesthesia Plan: Spinal and MAC   Post-op Pain Management:    Induction:   Airway Management Planned:   Additional Equipment:   Intra-op Plan:   Post-operative Plan:   Informed Consent: I have reviewed the patients History and Physical, chart, labs and discussed the procedure including the risks, benefits and alternatives for the proposed anesthesia with the patient or authorized representative who has indicated his/her understanding and acceptance.   Dental Advisory Given  Plan Discussed with: CRNA and Surgeon  Anesthesia Plan Comments: (Patient offered spinal/MAC vs GA - pt prefers spinal/MAC with propofol because of h/o severe PONV.  Charlton Haws, MD)      Anesthesia Quick Evaluation

## 2013-11-27 ENCOUNTER — Encounter (HOSPITAL_COMMUNITY): Payer: Self-pay | Admitting: Obstetrics and Gynecology

## 2013-11-28 NOTE — Op Note (Signed)
[name], [name]               ACCOUNT NO.:  [id number]  MEDICAL RECORD NO.:  [id number]  LOCATION:                                 FACILITY:  PHYSICIAN:  Ralene Bathe. Matthew Saras, M.D.DATE OF BIRTH:  1960-09-01  DATE OF PROCEDURE:  11/25/2013 DATE OF DISCHARGE:  11/25/2013                              OPERATIVE REPORT   PREOPERATIVE DIAGNOSES:  Abnormal uterine bleeding, endometrial polyps.  POSTOPERATIVE DIAGNOSES:  Abnormal uterine bleeding, endometrial polyps.  PROCEDURES:  Dilation and curettage, hysteroscopy with TruClear resection of endometrial polyps.  SURGEON:  Ralene Bathe. Matthew Saras, M.D.  ANESTHESIA:  Spinal.  COMPLICATIONS:  None.  DRAINS:  In and out of catheter.  ESTIMATED BLOOD LOSS:  Less than 50 mL.  SPECIMENS REMOVED:  Endometrial curettings, fragments of polyp to Pathology.  PROCEDURE AND FINDINGS: The patient was taken to the operating room after an adequate level of spinal anesthetic was obtained with the patient's legs in Steri-Strips, the lower abdomen, perineum, and vagina were prepped and draped in the usual fashion, bladder was drained.  EUA carried out.  Uterus mid position, mobile, normal size.  Adnexa negative.  Appropriate time-outs were taken at that point.  The speculum was positioned.  Paracervical block was then created by infiltrating at 3 and 9 o'clock submucosally 5- 7 mL of 1% plain Xylocaine at each side after negative aspiration.  The uterus sounded at 9 cm.  This was progressively dilated to 27-29 [facility] dilator.  The continuous flow hysteroscope was then inserted.  The shape of the cavity was normal, but there were 2 distinct polyps noted along with some shaggy endometrial buildup along the posterior wall.  A 2.7 mm resector was in position.  The polyps were resected along with the shaggy endometrium with an otherwise normal cavity and minimal bleeding.  This was all submitted to Pathology. Instruments were removed.  She was doing  well at that point, and transferred to recovery in good condition.      M. Matthew Saras, M.D.     RMH/MEDQ  D:  11/25/2013  T:  11/25/2013  Job:  [id number]

## 2014-07-11 ENCOUNTER — Other Ambulatory Visit: Payer: Self-pay | Admitting: Obstetrics and Gynecology

## 2014-07-12 LAB — CYTOLOGY - PAP

## 2015-07-31 ENCOUNTER — Other Ambulatory Visit: Payer: Self-pay | Admitting: Obstetrics and Gynecology

## 2015-07-31 DIAGNOSIS — R928 Other abnormal and inconclusive findings on diagnostic imaging of breast: Secondary | ICD-10-CM

## 2015-08-08 ENCOUNTER — Other Ambulatory Visit: Payer: Self-pay | Admitting: Obstetrics and Gynecology

## 2015-08-08 ENCOUNTER — Ambulatory Visit
Admission: RE | Admit: 2015-08-08 | Discharge: 2015-08-08 | Disposition: A | Discharge status: Home / Self Care | Payer: 59 | Source: Ambulatory Visit | Attending: Obstetrics and Gynecology | Admitting: Obstetrics and Gynecology

## 2015-08-08 DIAGNOSIS — R928 Other abnormal and inconclusive findings on diagnostic imaging of breast: Secondary | ICD-10-CM

## 2015-08-11 ENCOUNTER — Other Ambulatory Visit: Payer: Self-pay | Admitting: Obstetrics and Gynecology

## 2015-08-11 DIAGNOSIS — R928 Other abnormal and inconclusive findings on diagnostic imaging of breast: Secondary | ICD-10-CM

## 2015-08-14 ENCOUNTER — Ambulatory Visit
Admission: RE | Admit: 2015-08-14 | Discharge: 2015-08-14 | Disposition: A | Discharge status: Home / Self Care | Payer: 59 | Source: Ambulatory Visit | Attending: Obstetrics and Gynecology | Admitting: Obstetrics and Gynecology

## 2015-08-14 DIAGNOSIS — R928 Other abnormal and inconclusive findings on diagnostic imaging of breast: Secondary | ICD-10-CM

## 2016-01-29 DIAGNOSIS — M25571 Pain in right ankle and joints of right foot: Secondary | ICD-10-CM | POA: Diagnosis not present

## 2016-01-29 DIAGNOSIS — M79671 Pain in right foot: Secondary | ICD-10-CM | POA: Diagnosis not present

## 2016-01-29 DIAGNOSIS — G8929 Other chronic pain: Secondary | ICD-10-CM | POA: Diagnosis not present

## 2016-02-05 DIAGNOSIS — M25571 Pain in right ankle and joints of right foot: Secondary | ICD-10-CM | POA: Diagnosis not present

## 2016-02-05 DIAGNOSIS — G8929 Other chronic pain: Secondary | ICD-10-CM | POA: Diagnosis not present

## 2016-02-12 DIAGNOSIS — M25571 Pain in right ankle and joints of right foot: Secondary | ICD-10-CM | POA: Diagnosis not present

## 2016-02-12 DIAGNOSIS — G8929 Other chronic pain: Secondary | ICD-10-CM | POA: Diagnosis not present

## 2016-02-12 DIAGNOSIS — T148XXA Other injury of unspecified body region, initial encounter: Secondary | ICD-10-CM | POA: Diagnosis not present

## 2016-02-19 DIAGNOSIS — M79671 Pain in right foot: Secondary | ICD-10-CM | POA: Diagnosis not present

## 2016-02-19 DIAGNOSIS — M19071 Primary osteoarthritis, right ankle and foot: Secondary | ICD-10-CM | POA: Diagnosis not present

## 2016-02-19 DIAGNOSIS — M542 Cervicalgia: Secondary | ICD-10-CM | POA: Diagnosis not present

## 2016-02-22 DIAGNOSIS — M25571 Pain in right ankle and joints of right foot: Secondary | ICD-10-CM | POA: Diagnosis not present

## 2016-02-22 DIAGNOSIS — M19071 Primary osteoarthritis, right ankle and foot: Secondary | ICD-10-CM | POA: Diagnosis not present

## 2016-02-22 DIAGNOSIS — M79671 Pain in right foot: Secondary | ICD-10-CM | POA: Diagnosis not present

## 2016-02-22 DIAGNOSIS — M542 Cervicalgia: Secondary | ICD-10-CM | POA: Diagnosis not present

## 2016-02-27 DIAGNOSIS — M542 Cervicalgia: Secondary | ICD-10-CM | POA: Diagnosis not present

## 2016-02-27 DIAGNOSIS — M79671 Pain in right foot: Secondary | ICD-10-CM | POA: Diagnosis not present

## 2016-02-27 DIAGNOSIS — M19071 Primary osteoarthritis, right ankle and foot: Secondary | ICD-10-CM | POA: Diagnosis not present

## 2016-03-01 DIAGNOSIS — M542 Cervicalgia: Secondary | ICD-10-CM | POA: Diagnosis not present

## 2016-03-01 DIAGNOSIS — M19071 Primary osteoarthritis, right ankle and foot: Secondary | ICD-10-CM | POA: Diagnosis not present

## 2016-03-01 DIAGNOSIS — M79671 Pain in right foot: Secondary | ICD-10-CM | POA: Diagnosis not present

## 2016-03-05 DIAGNOSIS — M19071 Primary osteoarthritis, right ankle and foot: Secondary | ICD-10-CM | POA: Diagnosis not present

## 2016-03-05 DIAGNOSIS — M542 Cervicalgia: Secondary | ICD-10-CM | POA: Diagnosis not present

## 2016-03-05 DIAGNOSIS — M79671 Pain in right foot: Secondary | ICD-10-CM | POA: Diagnosis not present

## 2016-03-07 DIAGNOSIS — G43719 Chronic migraine without aura, intractable, without status migrainosus: Secondary | ICD-10-CM | POA: Diagnosis not present

## 2016-03-08 DIAGNOSIS — M19071 Primary osteoarthritis, right ankle and foot: Secondary | ICD-10-CM | POA: Diagnosis not present

## 2016-03-08 DIAGNOSIS — M79671 Pain in right foot: Secondary | ICD-10-CM | POA: Diagnosis not present

## 2016-03-08 DIAGNOSIS — M542 Cervicalgia: Secondary | ICD-10-CM | POA: Diagnosis not present

## 2016-03-12 DIAGNOSIS — M542 Cervicalgia: Secondary | ICD-10-CM | POA: Diagnosis not present

## 2016-03-12 DIAGNOSIS — M19071 Primary osteoarthritis, right ankle and foot: Secondary | ICD-10-CM | POA: Diagnosis not present

## 2016-03-12 DIAGNOSIS — M79671 Pain in right foot: Secondary | ICD-10-CM | POA: Diagnosis not present

## 2016-03-14 DIAGNOSIS — M542 Cervicalgia: Secondary | ICD-10-CM | POA: Diagnosis not present

## 2016-03-14 DIAGNOSIS — M19071 Primary osteoarthritis, right ankle and foot: Secondary | ICD-10-CM | POA: Diagnosis not present

## 2016-03-14 DIAGNOSIS — M79671 Pain in right foot: Secondary | ICD-10-CM | POA: Diagnosis not present

## 2016-03-18 DIAGNOSIS — M19071 Primary osteoarthritis, right ankle and foot: Secondary | ICD-10-CM | POA: Diagnosis not present

## 2016-03-18 DIAGNOSIS — M79671 Pain in right foot: Secondary | ICD-10-CM | POA: Diagnosis not present

## 2016-03-18 DIAGNOSIS — M542 Cervicalgia: Secondary | ICD-10-CM | POA: Diagnosis not present

## 2016-03-22 DIAGNOSIS — M79671 Pain in right foot: Secondary | ICD-10-CM | POA: Diagnosis not present

## 2016-03-22 DIAGNOSIS — M542 Cervicalgia: Secondary | ICD-10-CM | POA: Diagnosis not present

## 2016-03-22 DIAGNOSIS — M19071 Primary osteoarthritis, right ankle and foot: Secondary | ICD-10-CM | POA: Diagnosis not present

## 2016-03-26 DIAGNOSIS — M19071 Primary osteoarthritis, right ankle and foot: Secondary | ICD-10-CM | POA: Diagnosis not present

## 2016-03-26 DIAGNOSIS — M79671 Pain in right foot: Secondary | ICD-10-CM | POA: Diagnosis not present

## 2016-03-26 DIAGNOSIS — M542 Cervicalgia: Secondary | ICD-10-CM | POA: Diagnosis not present

## 2016-03-28 DIAGNOSIS — M19071 Primary osteoarthritis, right ankle and foot: Secondary | ICD-10-CM | POA: Diagnosis not present

## 2016-03-28 DIAGNOSIS — M79671 Pain in right foot: Secondary | ICD-10-CM | POA: Diagnosis not present

## 2016-03-28 DIAGNOSIS — M542 Cervicalgia: Secondary | ICD-10-CM | POA: Diagnosis not present

## 2016-03-29 DIAGNOSIS — G8929 Other chronic pain: Secondary | ICD-10-CM | POA: Diagnosis not present

## 2016-03-29 DIAGNOSIS — M722 Plantar fascial fibromatosis: Secondary | ICD-10-CM | POA: Diagnosis not present

## 2016-03-29 DIAGNOSIS — M79671 Pain in right foot: Secondary | ICD-10-CM | POA: Diagnosis not present

## 2016-04-01 DIAGNOSIS — E038 Other specified hypothyroidism: Secondary | ICD-10-CM | POA: Diagnosis not present

## 2016-04-01 DIAGNOSIS — I10 Essential (primary) hypertension: Secondary | ICD-10-CM | POA: Diagnosis not present

## 2016-04-04 DIAGNOSIS — M19071 Primary osteoarthritis, right ankle and foot: Secondary | ICD-10-CM | POA: Diagnosis not present

## 2016-04-04 DIAGNOSIS — M542 Cervicalgia: Secondary | ICD-10-CM | POA: Diagnosis not present

## 2016-04-04 DIAGNOSIS — M79671 Pain in right foot: Secondary | ICD-10-CM | POA: Diagnosis not present

## 2016-04-08 DIAGNOSIS — G4733 Obstructive sleep apnea (adult) (pediatric): Secondary | ICD-10-CM | POA: Diagnosis not present

## 2016-04-08 DIAGNOSIS — Z1389 Encounter for screening for other disorder: Secondary | ICD-10-CM | POA: Diagnosis not present

## 2016-04-08 DIAGNOSIS — Z0000 Encounter for general adult medical examination without abnormal findings: Secondary | ICD-10-CM | POA: Diagnosis not present

## 2016-04-08 DIAGNOSIS — R7309 Other abnormal glucose: Secondary | ICD-10-CM | POA: Diagnosis not present

## 2016-04-09 DIAGNOSIS — M542 Cervicalgia: Secondary | ICD-10-CM | POA: Diagnosis not present

## 2016-04-09 DIAGNOSIS — M79671 Pain in right foot: Secondary | ICD-10-CM | POA: Diagnosis not present

## 2016-04-09 DIAGNOSIS — M19071 Primary osteoarthritis, right ankle and foot: Secondary | ICD-10-CM | POA: Diagnosis not present

## 2016-04-09 DIAGNOSIS — Z1212 Encounter for screening for malignant neoplasm of rectum: Secondary | ICD-10-CM | POA: Diagnosis not present

## 2016-04-11 DIAGNOSIS — M79671 Pain in right foot: Secondary | ICD-10-CM | POA: Diagnosis not present

## 2016-04-11 DIAGNOSIS — M19071 Primary osteoarthritis, right ankle and foot: Secondary | ICD-10-CM | POA: Diagnosis not present

## 2016-04-11 DIAGNOSIS — M542 Cervicalgia: Secondary | ICD-10-CM | POA: Diagnosis not present

## 2016-04-18 DIAGNOSIS — M19071 Primary osteoarthritis, right ankle and foot: Secondary | ICD-10-CM | POA: Diagnosis not present

## 2016-04-18 DIAGNOSIS — M542 Cervicalgia: Secondary | ICD-10-CM | POA: Diagnosis not present

## 2016-04-18 DIAGNOSIS — M79671 Pain in right foot: Secondary | ICD-10-CM | POA: Diagnosis not present

## 2016-04-24 DIAGNOSIS — M79671 Pain in right foot: Secondary | ICD-10-CM | POA: Diagnosis not present

## 2016-04-24 DIAGNOSIS — M542 Cervicalgia: Secondary | ICD-10-CM | POA: Diagnosis not present

## 2016-04-24 DIAGNOSIS — M19071 Primary osteoarthritis, right ankle and foot: Secondary | ICD-10-CM | POA: Diagnosis not present

## 2016-04-30 DIAGNOSIS — M19071 Primary osteoarthritis, right ankle and foot: Secondary | ICD-10-CM | POA: Diagnosis not present

## 2016-04-30 DIAGNOSIS — M542 Cervicalgia: Secondary | ICD-10-CM | POA: Diagnosis not present

## 2016-04-30 DIAGNOSIS — M79671 Pain in right foot: Secondary | ICD-10-CM | POA: Diagnosis not present

## 2016-05-01 DIAGNOSIS — G4733 Obstructive sleep apnea (adult) (pediatric): Secondary | ICD-10-CM | POA: Diagnosis not present

## 2016-05-02 DIAGNOSIS — M542 Cervicalgia: Secondary | ICD-10-CM | POA: Diagnosis not present

## 2016-05-02 DIAGNOSIS — M79671 Pain in right foot: Secondary | ICD-10-CM | POA: Diagnosis not present

## 2016-05-02 DIAGNOSIS — M19071 Primary osteoarthritis, right ankle and foot: Secondary | ICD-10-CM | POA: Diagnosis not present

## 2016-05-07 DIAGNOSIS — M19071 Primary osteoarthritis, right ankle and foot: Secondary | ICD-10-CM | POA: Diagnosis not present

## 2016-05-07 DIAGNOSIS — M79671 Pain in right foot: Secondary | ICD-10-CM | POA: Diagnosis not present

## 2016-05-07 DIAGNOSIS — M542 Cervicalgia: Secondary | ICD-10-CM | POA: Diagnosis not present

## 2016-05-09 DIAGNOSIS — M19071 Primary osteoarthritis, right ankle and foot: Secondary | ICD-10-CM | POA: Diagnosis not present

## 2016-05-09 DIAGNOSIS — M542 Cervicalgia: Secondary | ICD-10-CM | POA: Diagnosis not present

## 2016-05-09 DIAGNOSIS — M79671 Pain in right foot: Secondary | ICD-10-CM | POA: Diagnosis not present

## 2016-05-10 DIAGNOSIS — T148XXA Other injury of unspecified body region, initial encounter: Secondary | ICD-10-CM | POA: Diagnosis not present

## 2016-05-10 DIAGNOSIS — G8929 Other chronic pain: Secondary | ICD-10-CM | POA: Diagnosis not present

## 2016-05-10 DIAGNOSIS — M79671 Pain in right foot: Secondary | ICD-10-CM | POA: Diagnosis not present

## 2016-05-24 DIAGNOSIS — K602 Anal fissure, unspecified: Secondary | ICD-10-CM | POA: Diagnosis not present

## 2016-05-24 DIAGNOSIS — K5904 Chronic idiopathic constipation: Secondary | ICD-10-CM | POA: Diagnosis not present

## 2016-06-03 ENCOUNTER — Ambulatory Visit (INDEPENDENT_AMBULATORY_CARE_PROVIDER_SITE_OTHER): Payer: 59 | Admitting: Neurology

## 2016-06-03 ENCOUNTER — Encounter: Payer: Self-pay | Admitting: Neurology

## 2016-06-03 VITALS — Ht 62.25 in | Wt 213.0 lb

## 2016-06-03 DIAGNOSIS — R635 Abnormal weight gain: Secondary | ICD-10-CM | POA: Diagnosis not present

## 2016-06-03 DIAGNOSIS — R400 Somnolence: Secondary | ICD-10-CM | POA: Diagnosis not present

## 2016-06-03 DIAGNOSIS — G4733 Obstructive sleep apnea (adult) (pediatric): Secondary | ICD-10-CM

## 2016-06-03 DIAGNOSIS — R351 Nocturia: Secondary | ICD-10-CM | POA: Diagnosis not present

## 2016-06-03 DIAGNOSIS — E669 Obesity, unspecified: Secondary | ICD-10-CM | POA: Diagnosis not present

## 2016-06-03 NOTE — Progress Notes (Signed)
Subjective:    Patient ID: [name] is a 56 y.o. female.  HPI     Star Age, MD, PhD [facility] [address] [place], [place] [address]  Dear Dr. Virgina Jock,   I saw your patient, [name], upon your kind request in my neurologic clinic today for initial consultation of her sleep disorder, in particular reevaluation of her obstructive sleep apnea. The patient is unaccompanied today. As you know, [name] is a 56 year old right-handed woman with an underlying medical history of hypertension, hypothyroidism, migraine headaches, allergic rhinitis, plantar fasciitis, cervical disc disease with myelopathy, status post neck surgery on 03/19/2013 under Dr. Sherwood Gambler, hyperlipidemia, and obesity, who was previously diagnosed with obstructive sleep apnea and placed on CPAP therapy. She has a CPAP machine which she has been using, but some parts are defective, including the humidier and the power button and therefore, the machine does not always work. She needs an updated machine. Sleep study testing was in or around 2010. Prior sleep study results are not available for my review today. CPAP compliance download was reviewed from 01/24/2016 through 04/22/2016, which is a total of 90 days, during which time she used her CPAP every night with percent used days greater than 4 hours at 100%, average usage of 9 hours and 5 minutes, residual AHI 1 per hour, pressure at 9 cm with EPR. I reviewed your office note from 05/01/2016, which you kindly included. Her DME company is [facility]. Her Epworth sleepiness score is 9 out of 24 today, fatigue score is 51 out of 63. Her bedtime is around MN to 1 AM and she has been taking ambien nearly nightly. She reports recent increase in stress in the past few months. She has had weight gain as well. Her wakeup time is usually between 9:30 and 10. She has nocturia once per average night. She has occasional morning  headaches. For her migraines she [facility] and gets Botox injections. She reports a possible family history of obstructive sleep apnea in her father. She denies restless leg symptoms. She is a nonsmoker and drinks alcohol very occasionally, once every week or every 2 weeks. She does not drink caffeine daily. She does not work. She home schooled her 3 children. She lives at home with her husband and currently her middle daughter is back home. She has an older son and a younger son as well. She does not watch TV in her bedroom. They have a dog, not in their bedroom at night.   Her Past Medical History Is Significant For: Past Medical History:  Diagnosis Date  . Back pain of lumbar region with sciatica   . Family history of anesthesia complication    grandfather died under anesthesia 9's- had heart issues  . H/O carpal tunnel repair 14  . Headache(784.0)   . Hypertension   . Hypothyroidism   . PONV (postoperative nausea and vomiting)   . Sleep apnea    cpap since 10    Her Past Surgical History Is Significant For: Past Surgical History:  Procedure Laterality Date  . CERVICAL DISC ARTHROPLASTY N/A 03/19/2013   Procedure: CERVICAL FIVE TO SIX, CERVICAL SIX TO SEVEN CERVICAL ANTERIOR DISC ARTHROPLASTY;  Surgeon: Kristeen Miss, MD;  Location: [facility];  Service: Neurosurgery;  Laterality: N/A;  C56 C67 artificial disc replacement  . CESAREAN SECTION    . COLONOSCOPY    . DILATATION & CURETTAGE/HYSTEROSCOPY WITH TRUECLEAR N/A  11/25/2013   Procedure: DILATATION & CURETTAGE/HYSTEROSCOPY WITH TRUCLEAR;  Surgeon: Margarette Asal, MD;  Location: [facility];  Service: Gynecology;  Laterality: N/A;  . ECTOPIC PREGNANCY SURGERY    . GALLBLADDER SURGERY    . MOUTH SURGERY     child- dog bite  . TONSILLECTOMY    . TUBAL LIGATION      Her Family History Is Significant For: Family History  Problem Relation Age of Onset  . Hyperlipidemia Mother   . Heart disease Father    . Hypertension Father   . Diabetes Father   . Cancer Maternal Grandmother        breast cancer  . Diabetes Paternal Grandmother   . Heart disease Paternal Grandfather   . Hypertension Paternal Grandfather     Her Social History Is Significant For: Social History   Social History  . Marital status: Married    Spouse name: N/A  . Number of children: 3  . Years of education: college   Occupational History  . N/A    Social History Main Topics  . Smoking status: Never Smoker  . Smokeless tobacco: Never Used     Comment: occ alcohol  . Alcohol use Yes     Comment: socially  . Drug use: No  . Sexual activity: Not Asked   Other Topics Concern  . None   Social History Narrative   Denies caffeine use     Her Allergies Are:  Allergies  Allergen Reactions  . Effexor [Venlafaxine Hcl]     Weight loss and cant sleep  . Flexeril [Cyclobenzaprine] Hives  :   Her Current Medications Are:  Outpatient Encounter Prescriptions as of 06/03/2016  Medication Sig  . calcitonin, salmon, (MIACALCIN/FORTICAL) 200 UNIT/ACT nasal spray   . Cholecalciferol (VITAMIN D3) 5000 units CAPS Take by mouth.  Marland Kitchen HYDROcodone-ibuprofen (VICOPROFEN) 7.5-200 MG per tablet Take 1 tablet by mouth every 8 (eight) hours as needed for moderate pain.  . hydrOXYzine (ATARAX/VISTARIL) 10 MG tablet take 1 to 3 tablets by mouth every 6 hours if needed for itching  . ibuprofen (ADVIL) 200 MG tablet Take 1 tablet (200 mg total) by mouth every 6 (six) hours as needed.  . Lidocaine 0.5 % AERO Apply topically.  . naproxen sodium (ANAPROX) 220 MG tablet Take 220 mg by mouth.  . OnabotulinumtoxinA (BOTOX IM) Inject into the muscle.  . rizatriptan (MAXALT-MLT) 10 MG disintegrating tablet Take 10 mg by mouth.  . thyroid (ARMOUR THYROID) 30 MG tablet Take 30 mg by mouth.  . TOPROL XL 100 MG 24 hr tablet   . Valerian Root 100 MG CAPS Take by mouth.  . zolpidem (AMBIEN) 10 MG tablet    No facility-administered encounter  medications on file as of 06/03/2016.   :  Review of Systems:  Out of a complete 14 point review of systems, all are reviewed and negative with the exception of these symptoms as listed below:  Review of Systems  Neurological:       Patient has had several sleep studies in the past.  She would like to use [facility].  Last sleep study was about 2012. Currently using CPAP, got in 2010. Needs new CPAP machine.        Objective:  Neurologic Exam  Physical Exam Physical Examination:   There were no vitals filed for this visit.  General Examination: The patient is a very pleasant 56 y.o. female in no acute distress. She appears well-developed and well-nourished and well groomed.  HEENT: Normocephalic, atraumatic, pupils are equal, round and reactive to light and accommodation. Funduscopic exam is normal with sharp disc margins noted. Extraocular tracking is good without limitation to gaze excursion or nystagmus noted. Normal smooth pursuit is noted. Hearing is grossly intact. Tympanic membranes are clear bilaterally. Face is symmetric with normal facial animation and normal facial sensation. Speech is clear with no dysarthria noted. There is no hypophonia. There is no lip, neck/head, jaw or voice tremor. Neck is supple with full range of passive and active motion. There are no carotid bruits on auscultation. Oropharynx exam reveals: mild mouth dryness, adequate dental hygiene and mild airway crowding, due to smaller airway and redundant soft palate. Mallampati is class II. Tongue protrudes centrally and palate elevates symmetrically. Tonsils are absent. Neck size is 15 7/8 inches. She has a Mild overbite. Nasal inspection reveals no significant nasal mucosal bogginess or redness and no septal deviation.   Chest: Clear to auscultation without wheezing, rhonchi or crackles noted.  Heart: S1+S2+0, regular and normal without murmurs, rubs or gallops noted.   Abdomen: Soft, non-tender and  non-distended with normal bowel sounds appreciated on auscultation.  Extremities: There is no pitting edema in the distal lower extremities bilaterally. Pedal pulses are intact.  Skin: Warm and dry without trophic changes noted.  Musculoskeletal: exam reveals no obvious joint deformities, tenderness or joint swelling or erythema.   Neurologically:  Mental status: The patient is awake, alert and oriented in all 4 spheres. Her immediate and remote memory, attention, language skills and fund of knowledge are appropriate. There is no evidence of aphasia, agnosia, apraxia or anomia. Speech is clear with normal prosody and enunciation. Thought process is linear. Mood is normal and affect is normal.  Cranial nerves II - XII are as described above under HEENT exam. In addition: shoulder shrug is normal with equal shoulder height noted. Motor exam: Normal bulk, strength and tone is noted. There is no drift, tremor or rebound. Romberg is negative. Reflexes are 1+ throughout. Fine motor skills and coordination: intact with normal finger taps, normal hand movements, normal rapid alternating patting, normal foot taps and normal foot agility.  Cerebellar testing: No dysmetria or intention tremor on finger to nose testing. Heel to shin is unremarkable bilaterally. There is no truncal or gait ataxia.  Sensory exam: intact to light touch, and temp in the upper and lower extremities.  Gait, station and balance: She stands easily. No veering to one side is noted. No leaning to one side is noted. Posture is age-appropriate and stance is narrow based. Gait shows normal stride length and normal pace. No problems turning are noted. Tandem walk is slightly challenging for her.   Assessment and Plan:  In summary, MANROOP JAKUBOWICZ is a very pleasant 56 y.o.-year old female with an underlying medical history of hypertension, hypothyroidism, migraine headaches, allergic rhinitis, plantar fasciitis, cervical disc disease with  myelopathy, status post neck surgery on 03/19/2013 under Dr. Sherwood Gambler, hyperlipidemia, and obesity, whose history and physical exam are in keeping with obstructive sleep apnea (OSA).She has an older CPAP machine which is defective, she needs an updated sleep study to confirm the diagnosis and get her a new machine and updated supplies. Sleep study testing was likely nearly 8 years ago. She has been using CPAP at a pressure of 9 cm which appears to be adequate. She is using a small fullface mask, [employer]. I had a long chat with the patient about my findings and the diagnosis of OSA,  its prognosis and treatment options. We talked about medical treatments, surgical interventions and non-pharmacological approaches. I explained in particular the risks and ramifications of untreated moderate to severe OSA, especially with respect to developing cardiovascular disease down the Road, including congestive heart failure, difficult to treat hypertension, cardiac arrhythmias, or stroke. Even type 2 diabetes has, in part, been linked to untreated OSA. Symptoms of untreated OSA include daytime sleepiness, memory problems, mood irritability and mood disorder such as depression and anxiety, lack of energy, as well as recurrent headaches, especially morning headaches. We talked about trying to maintain a healthy lifestyle in general, as well as the importance of weight control. I encouraged the patient to eat healthy, exercise daily and keep well hydrated, to keep a scheduled bedtime and wake time routine, to not skip any meals and eat healthy snacks in between meals. I advised the patient not to drive when feeling sleepy. I recommended the following at this time: sleep study with potential positive airway pressure titration. (We will score hypopneas at 4%).  She is familiar with the sleep test procedure. I will see her back after testing is completed. We will also update her via phone call us the results. I answered  all her questions today and she was in agreement.  Thank you very much for allowing me to participate in the care of this nice patient. If I can be of any further assistance to you please do not hesitate to call me at [phone].  Sincerely,   Star Age, MD, PhD

## 2016-06-03 NOTE — Patient Instructions (Signed)
Based on your symptoms and your exam I believe you are still at risk for obstructive sleep apnea or OSA, and I think we should proceed with a sleep study to determine how severe it is and to qualify you for a new machine and supplies. If you have more than mild OSA, I want you to consider treatment with CPAP. Please remember, the risks and ramifications of moderate to severe obstructive sleep apnea or OSA are: Cardiovascular disease, including congestive heart failure, stroke, difficult to control hypertension, arrhythmias, and even type 2 diabetes has been linked to untreated OSA. Sleep apnea causes disruption of sleep and sleep deprivation in most cases, which, in turn, can cause recurrent headaches, problems with memory, mood, concentration, focus, and vigilance. Most people with untreated sleep apnea report excessive daytime sleepiness, which can affect their ability to drive. Please do not drive if you feel sleepy.   I will likely see you back after your sleep study to go over the test results and where to go from there. We will call you after your sleep study to advise about the results (most likely, you will hear from Beverlee Nims, my nurse) and to set up an appointment at the time, as necessary.    Our sleep lab administrative assistant, Arrie Aran will meet with you or call you to schedule your sleep study. If you don't hear back from her by next week please feel free to call her at [phone]. This is her direct line and please leave a message with your phone number to call back if you get the voicemail box. She will call back as soon as possible.

## 2016-06-04 DIAGNOSIS — N390 Urinary tract infection, site not specified: Secondary | ICD-10-CM | POA: Diagnosis not present

## 2016-06-04 DIAGNOSIS — N760 Acute vaginitis: Secondary | ICD-10-CM | POA: Diagnosis not present

## 2016-06-06 DIAGNOSIS — R51 Headache: Secondary | ICD-10-CM | POA: Diagnosis not present

## 2016-06-06 DIAGNOSIS — G43719 Chronic migraine without aura, intractable, without status migrainosus: Secondary | ICD-10-CM | POA: Diagnosis not present

## 2016-06-06 DIAGNOSIS — G43839 Menstrual migraine, intractable, without status migrainosus: Secondary | ICD-10-CM | POA: Diagnosis not present

## 2016-06-18 ENCOUNTER — Ambulatory Visit (INDEPENDENT_AMBULATORY_CARE_PROVIDER_SITE_OTHER): Payer: 59 | Admitting: Neurology

## 2016-06-18 DIAGNOSIS — G4733 Obstructive sleep apnea (adult) (pediatric): Secondary | ICD-10-CM | POA: Diagnosis not present

## 2016-06-18 DIAGNOSIS — G4720 Circadian rhythm sleep disorder, unspecified type: Secondary | ICD-10-CM

## 2016-06-20 ENCOUNTER — Telehealth: Payer: Self-pay

## 2016-06-20 DIAGNOSIS — N760 Acute vaginitis: Secondary | ICD-10-CM | POA: Diagnosis not present

## 2016-06-20 DIAGNOSIS — Z8669 Personal history of other diseases of the nervous system and sense organs: Secondary | ICD-10-CM

## 2016-06-20 NOTE — Telephone Encounter (Signed)
I spoke to patient. She is aware of results below. She is worried that she may still have sleep apnea. Patient states that back in 2016, PCP asked [employer] to pull a 2 week down which showed that she had a high AHI.  Patient requests to repeat study at home or in lab.

## 2016-06-20 NOTE — Telephone Encounter (Signed)
I left a detailed message on home number listed (per DPR) advising her that Dr. Rexene Alberts ordered a home sleep test to re-evaluate her apnea and that our sleep lab will call her to get this scheduled. I asked pt to please call us back for any questions or concerns.

## 2016-06-20 NOTE — Addendum Note (Signed)
Addended by: Star Age on: 06/20/2016 03:45 PM   Modules accepted: Orders

## 2016-06-20 NOTE — Progress Notes (Signed)
Patient referred by Dr. Virgina Jock, seen by me on 06/03/16, diagnostic PSG on 06/18/16.   Please call and notify the patient that the recent sleep study did not show any significant obstructive sleep apnea. AHI was 0.2/hour, O2 nadir 93%, mild intermittent snoring was noted. CPAP therapy is not indicated based of these results. However, sleep efficiency was reduced somewhat, and little REM sleep was achieved, no supine REM; there may be some underestimation of her overall sleep disordered breathing based on these issues. If the patient wishes to repeat testing, we can consider a home sleep test; this may or may not be covered by her insurance. Please let me know how she wants to proceed. New CPAP equipment will likely not be covered based on this PSG. She may want to go without CPAP and see how it goes.  Once you have spoken to patient, you can close this encounter.   Thanks,  Star Age, MD, PhD [facility])

## 2016-06-20 NOTE — Telephone Encounter (Signed)
-----   Message from Star Age, MD sent at 06/20/2016  8:41 AM EDT ----- Patient referred by Dr. Virgina Jock, seen by me on 06/03/16, diagnostic PSG on 06/18/16.   Please call and notify the patient that the recent sleep study did not show any significant obstructive sleep apnea. AHI was 0.2/hour, O2 nadir 93%, mild intermittent snoring was noted. CPAP therapy is not indicated based of these results. However, sleep efficiency was reduced somewhat, and little REM sleep was achieved, no supine REM; there may be some underestimation of her overall sleep disordered breathing based on these issues. If the patient wishes to repeat testing, we can consider a home sleep test; this may or may not be covered by her insurance. Please let me know how she wants to proceed. New CPAP equipment will likely not be covered based on this PSG. She may want to go without CPAP and see how it goes.  Once you have spoken to patient, you can close this encounter.   Thanks,  Star Age, MD, PhD [facility])

## 2016-06-20 NOTE — Telephone Encounter (Signed)
LM on both numbers for patient to call back for results.

## 2016-06-20 NOTE — Telephone Encounter (Signed)
I will order a home sleep test in the hope she will sleep better at home. In lab study was neg for OSA.

## 2016-06-20 NOTE — Procedures (Signed)
PATIENT'S NAME:  [name], [name] DOB:      1960/08/14      MR#:    [id number]     DATE OF RECORDING: 06/18/2016 REFERRING M.D.:  Shon Baton, MD Study Performed:   Baseline Polysomnogram HISTORY: 56 year old woman with a history of hypertension, hypothyroidism, migraine headaches, allergic rhinitis, plantar fasciitis, and cervical disc disease with myelopathy, status post neck surgery, hyperlipidemia, and obesity, who was previously diagnosed with obstructive sleep apnea and placed on CPAP therapy. She has a CPAP machine which she has been using, but some parts are defective, including the humidifier and the power button and therefore, the machine does not always work. She needs an updated machine. Sleep study testing was in or around 2010. The patient endorsed the Epworth Sleepiness Scale at 9/24 points. The patient's weight 213 pounds with a height of 62 (inches), resulting in a BMI of 38.9 kg/m2. The patient's neck circumference measured 15.9 inches.  CURRENT MEDICATIONS: Calcitonin, Cholecalciferol, Hydrocodone, Hydroxyzine, Ibuprofen, Lidocaine, Naproxen, Onabotulinumtoxin, Rizatriptan, Thyroid, Toprol Xl and Zolpidem   PROCEDURE:  This is a multichannel digital polysomnogram utilizing the Somnostar 11.2 system.  Electrodes and sensors were applied and monitored per AASM Specifications.   EEG, EOG, Chin and Limb EMG, were sampled at 200 Hz.  ECG, Snore and Nasal Pressure, Thermal Airflow, Respiratory Effort, CPAP Flow and Pressure, Oximetry was sampled at 50 Hz. Digital video and audio were recorded.      BASELINE STUDY  Lights Out was at 23:08 and Lights On at 05:16.  Total recording time (TRT) was 368.5 minutes, with a total sleep time (TST) of  248 minutes.   The patient's sleep latency was 37 minutes, which is delayed.  REM latency was 139.5 minutes which is mildly delayed. The sleep efficiency was 67.3 %, which is reduced.     SLEEP ARCHITECTURE: WASO (Wake after sleep onset) was 83.5 minutes  with mild sleep fragmentation noted and one longer period of wakefulness.  There were 10.5 minutes in Stage N1, 208.5 minutes Stage N2, 11 minutes Stage N3 and 18 minutes in Stage REM.  The percentage of Stage N1 was 4.2%, Stage N2 was 84.1%, which is highly increased, Stage N3 was 4.4%, which is reduced, and Stage R (REM sleep) was 7.3%, which is reduced. The arousals were noted as: 20 were spontaneous, 0 were associated with PLMs, 1 were associated with respiratory events.    Audio and video analysis did not show any abnormal or unusual movements, behaviors, phonations or vocalizations.  The patient took 3 bathroom breaks. Intermittent mild snoring was noted. The EKG was in keeping with normal sinus rhythm (NSR).  RESPIRATORY ANALYSIS:  There were a total of 1 respiratory events:  0 obstructive apneas, 0 central apneas and 0 mixed apneas with a total of 0 apneas and an apnea index (AI) of 0 /hour. There were 1 hypopneas with a hypopnea index of .2 /hour. The patient also had 0 respiratory event related arousals (RERAs).      The total APNEA/HYPOPNEA INDEX (AHI) was .2/hour and the total RESPIRATORY DISTURBANCE INDEX was .2 /hour.  1 events occurred in REM sleep and 0 events in NREM. The REM AHI was 3.3 /hour, versus a non-REM AHI of 0. The patient spent 116 minutes of total sleep time in the supine position and 132 minutes in non-supine.. The supine AHI was 0.0 versus a non-supine AHI of 0.5.  OXYGEN SATURATION & C02:  The Wake baseline 02 saturation was 98%, with the lowest  being 93%. Time spent below 89% saturation equaled 0 minutes.  PERIODIC LIMB MOVEMENTS:  The patient had a total of 0 Periodic Limb Movements.  The Periodic Limb Movement (PLM) index was 0 and the PLM Arousal index was 0/hour.  Post-study, the patient indicated that sleep was the same as usual.   IMPRESSION:  1. Dysfunctions associated with sleep stages or arousal from sleep  RECOMMENDATIONS:  1. This study does not  demonstrate any significant obstructive or central sleep disordered breathing. AHI was 0.2/hour, O2 nadir 93%, mild intermittent snoring was noted. CPAP therapy is not indicated based of these results. However, sleep efficiency was reduced, and little REM sleep was achieved, no supine REM; there may be some underestimation of her overall sleep disordered breathing based on these issues. If the patient wishes to repeat testing, we can consider a home sleep test; this may or may not be covered by her insurance. Nevertheless, these issues will be discussed with the patient.  2. This study shows sleep fragmentation and abnormal sleep stage percentages; these are nonspecific findings and per se do not signify an intrinsic sleep disorder or a cause for the patient's sleep-related symptoms. Causes include (but are not limited to) the first night effect of the sleep study, circadian rhythm disturbances, medication effect or an underlying mood disorder or medical problem.  3. The patient should be cautioned not to drive, work at heights, or operate dangerous or heavy equipment when tired or sleepy. Review and reiteration of good sleep hygiene measures should be pursued with any patient. 4. The patient and her referring provider will be notified of the test results.  I certify that I have reviewed the entire raw data recording prior to the issuance of this report in accordance with the Standards of Accreditation of the American Academy of Sleep Medicine (AASM)    Star Age, MD, PhD Diplomat, American Board of Psychiatry and Neurology (Neurology and Sleep Medicine)

## 2016-07-10 ENCOUNTER — Ambulatory Visit (INDEPENDENT_AMBULATORY_CARE_PROVIDER_SITE_OTHER): Payer: 59 | Admitting: Neurology

## 2016-07-10 DIAGNOSIS — T148XXA Other injury of unspecified body region, initial encounter: Secondary | ICD-10-CM | POA: Diagnosis not present

## 2016-07-10 DIAGNOSIS — G4733 Obstructive sleep apnea (adult) (pediatric): Secondary | ICD-10-CM

## 2016-07-10 DIAGNOSIS — G8929 Other chronic pain: Secondary | ICD-10-CM | POA: Diagnosis not present

## 2016-07-10 DIAGNOSIS — M79671 Pain in right foot: Secondary | ICD-10-CM | POA: Diagnosis not present

## 2016-07-11 NOTE — Procedures (Signed)
[facility] [address] [place], [place] [address] NAME: [name] DOB: [birth year] MEDICAL RECORD [id number] DOS:07/10/2016 REFERRING PHYSICIAN: John Russo,MD STUDY PERFORMED: HST HISTORY: 56 year old woman with a history of hypertension, hypothyroidism, migraine headaches, allergic rhinitis, plantar fasciitis, cervical disc disease with myelopathy, status post neck surgery on 03/19/2013 under Dr. Sherwood Gambler, hyperlipidemia, and obesity, who was previously diagnosed with obstructive sleep apnea and placed on CPAP therapy. She needs re-evaluation and a new CPAP machine. She had a diagnostic attended sleep study on 06/18/16, but did not sleep very well.  STUDY RESULTS: Total Recording Time: 9h 41 m Total Apnea/Hypopnea Index (AHI): 6.7/hour Average Oxygen Saturation: 94% Lowest Oxygen Saturation: 78% with time below 88% of 6 min. Average Heart Rate: 61 bpm IMPRESSION: OSA RECOMMENDATION: This home sleep test demonstrates overall mild obstructive sleep apnea with a total AHI of 6.7/hour and O2 nadir of 78%. Given the patient's medical history and sleep related complaints, treatment with positive airway pressure (in the form of CPAP) is recommended. I will prescribe a new CPAP machine for home use.  The patient should be cautioned not to drive, work at heights, or operate dangerous or heavy equipment when tired or sleepy. Review and reiteration of good sleep hygiene measures should be pursued with any patient. The patient and her referring provider will be notified of the test results. The patient will be seen in follow up in sleep clinic at [facility].  I certify that I have reviewed the raw data recording prior to the issuance of this report in accordance with the standards of the American Academy of Sleep Medicine (AASM). Star Age, MD, PhD Diplomat, ABPN (Neurology and Sleep)

## 2016-07-11 NOTE — Progress Notes (Signed)
Patient referred by Virgina Jock, seen by me on 06/03/16, PSG on 06/18/16, which did not confirm OSA (but patient did not sleep very well and had little REM sleep, would like to get new CPAP machine), then had HST on 07/10/16:  Please call and notify the patient that the recent home sleep test did suggest the diagnosis of mild obstructive sleep apnea. I can write for a new CPAP machine and we can send to [facility]. FU in 10 weeks post set up with new machine. Can see MM or CM if needed. Pressure can stay at current setting, mask as per her choice.    Star Age, MD, PhD [facility])

## 2016-07-11 NOTE — Addendum Note (Signed)
Addended by: Star Age on: 07/11/2016 06:41 PM   Modules accepted: Orders

## 2016-07-15 ENCOUNTER — Telehealth: Payer: Self-pay

## 2016-07-15 NOTE — Telephone Encounter (Signed)
I called pt. I advised pt that Dr. Rexene Alberts reviewed their sleep study results and found that pt has mild osa. Dr. Rexene Alberts recommends that pt start a new cpap. I reviewed PAP compliance expectations with the pt. Pt is agreeable to starting a CPAP. I advised pt that an order will be sent to a DME, [facility], and [facility] will call the pt within about one week after they file with the pt's insurance. [facility] will show the pt how to use the machine, fit for masks, and troubleshoot the CPAP if needed. Pt did not wanted her cpap order sent to [facility] or [facility]. Pt's husband also uses [facility]. A follow up appt was made for insurance purposes with Dr. Rexene Alberts on 09/24/2016 at 3:00pm. Pt verbalized understanding to arrive 15 minutes early and bring their CPAP. A letter with all of this information in it will be mailed to the pt as a reminder. I verified with the pt that the address we have on file is correct. Pt asked that I mail her a copy of her sleep study and send a copy to Dr. Briscoe Deutscher. Pt verbalized understanding of results. Pt had no questions at this time but was encouraged to call back if questions arise.

## 2016-07-15 NOTE — Telephone Encounter (Signed)
I called pt to discuss her sleep study results. No answer, left a message asking her to call me back. 

## 2016-07-15 NOTE — Telephone Encounter (Signed)
-----   Message from Star Age, MD sent at 07/11/2016  6:41 PM EDT ----- Patient referred by Virgina Jock, seen by me on 06/03/16, PSG on 06/18/16, which did not confirm OSA (but patient did not sleep very well and had little REM sleep, would like to get new CPAP machine), then had HST on 07/10/16:  Please call and notify the patient that the recent home sleep test did suggest the diagnosis of mild obstructive sleep apnea. I can write for a new CPAP machine and we can send to [facility]. FU in 10 weeks post set up with new machine. Can see MM or CM if needed. Pressure can stay at current setting, mask as per her choice.    Star Age, MD, PhD [facility])

## 2016-07-30 DIAGNOSIS — Z01419 Encounter for gynecological examination (general) (routine) without abnormal findings: Secondary | ICD-10-CM | POA: Diagnosis not present

## 2016-08-07 DIAGNOSIS — G4733 Obstructive sleep apnea (adult) (pediatric): Secondary | ICD-10-CM | POA: Diagnosis not present

## 2016-08-15 DIAGNOSIS — Z13820 Encounter for screening for osteoporosis: Secondary | ICD-10-CM | POA: Diagnosis not present

## 2016-08-29 DIAGNOSIS — M4316 Spondylolisthesis, lumbar region: Secondary | ICD-10-CM | POA: Diagnosis not present

## 2016-09-07 DIAGNOSIS — G4733 Obstructive sleep apnea (adult) (pediatric): Secondary | ICD-10-CM | POA: Diagnosis not present

## 2016-09-11 DIAGNOSIS — G43839 Menstrual migraine, intractable, without status migrainosus: Secondary | ICD-10-CM | POA: Diagnosis not present

## 2016-09-11 DIAGNOSIS — R51 Headache: Secondary | ICD-10-CM | POA: Diagnosis not present

## 2016-09-11 DIAGNOSIS — G43719 Chronic migraine without aura, intractable, without status migrainosus: Secondary | ICD-10-CM | POA: Diagnosis not present

## 2016-09-13 DIAGNOSIS — M4726 Other spondylosis with radiculopathy, lumbar region: Secondary | ICD-10-CM | POA: Diagnosis not present

## 2016-09-13 DIAGNOSIS — M48061 Spinal stenosis, lumbar region without neurogenic claudication: Secondary | ICD-10-CM | POA: Diagnosis not present

## 2016-09-13 DIAGNOSIS — M5416 Radiculopathy, lumbar region: Secondary | ICD-10-CM | POA: Diagnosis not present

## 2016-09-13 DIAGNOSIS — M5136 Other intervertebral disc degeneration, lumbar region: Secondary | ICD-10-CM | POA: Diagnosis not present

## 2016-09-24 ENCOUNTER — Ambulatory Visit: Payer: Self-pay | Admitting: Neurology

## 2016-10-02 ENCOUNTER — Telehealth: Payer: Self-pay | Admitting: Neurology

## 2016-10-02 NOTE — Telephone Encounter (Signed)
I have not called this pt since I spoke to her in June and can find no documentation that any one from our office has called her recently. I advised pt of this. Pt verbalized understanding.

## 2016-10-02 NOTE — Telephone Encounter (Signed)
Pt states that she missed a call from [employer], she has called back and is asking that a message be left if she is not available.  Pt states she will be available on home# for another 40 mins.

## 2016-10-03 DIAGNOSIS — I10 Essential (primary) hypertension: Secondary | ICD-10-CM | POA: Diagnosis not present

## 2016-10-03 DIAGNOSIS — M4316 Spondylolisthesis, lumbar region: Secondary | ICD-10-CM | POA: Diagnosis not present

## 2016-10-08 DIAGNOSIS — M545 Low back pain: Secondary | ICD-10-CM | POA: Diagnosis not present

## 2016-10-08 DIAGNOSIS — G4733 Obstructive sleep apnea (adult) (pediatric): Secondary | ICD-10-CM | POA: Diagnosis not present

## 2016-10-08 DIAGNOSIS — Z23 Encounter for immunization: Secondary | ICD-10-CM | POA: Diagnosis not present

## 2016-10-21 ENCOUNTER — Telehealth: Payer: Self-pay | Admitting: Neurology

## 2016-10-21 ENCOUNTER — Encounter: Payer: Self-pay | Admitting: Neurology

## 2016-10-21 NOTE — Telephone Encounter (Signed)
Pt states she began using her CPAP 7-18, she had to r/s her October appointment, she is on wait list and would like to know if she can be seen before 10-18 becuase this will be the 3rd month of usage . Please call

## 2016-10-21 NOTE — Telephone Encounter (Signed)
I called pt. I advised her that we can work her in for 10/23/16 at 11:30am. Pt verbalized understanding of appt date and time and to bring her cpap.

## 2016-10-21 NOTE — Telephone Encounter (Signed)
Patient called back to our office and requested to be added back on 10/24/16 for CPAP new user.  Patient does state Thursday is not a good day and would like to see if we are able to schedule her on another day.  Please call.

## 2016-10-22 ENCOUNTER — Telehealth: Payer: Self-pay

## 2016-10-22 NOTE — Telephone Encounter (Signed)
I called pt, no answer on home nor cell number, left a detailed message on pt's cell phone number per DPR, advising her that Dr. Rexene Alberts will be out of the office on 10/23/16 due to an illness, but Jinny Blossom, NP has an opening on 10/23/16 at 11:30am (same time as original appt) and we will place pt on Megan's schedule for 10/23/2016 at 11:30am. I asked pt to call me back with questions or concerns.

## 2016-10-23 ENCOUNTER — Ambulatory Visit (INDEPENDENT_AMBULATORY_CARE_PROVIDER_SITE_OTHER): Payer: 59 | Admitting: Adult Health

## 2016-10-23 VITALS — BP 135/79 | HR 67 | Ht 62.25 in | Wt 188.2 lb

## 2016-10-23 DIAGNOSIS — G4733 Obstructive sleep apnea (adult) (pediatric): Secondary | ICD-10-CM | POA: Diagnosis not present

## 2016-10-23 DIAGNOSIS — M4316 Spondylolisthesis, lumbar region: Secondary | ICD-10-CM | POA: Diagnosis not present

## 2016-10-23 DIAGNOSIS — Z9989 Dependence on other enabling machines and devices: Secondary | ICD-10-CM | POA: Diagnosis not present

## 2016-10-23 DIAGNOSIS — M47816 Spondylosis without myelopathy or radiculopathy, lumbar region: Secondary | ICD-10-CM | POA: Diagnosis not present

## 2016-10-23 NOTE — Progress Notes (Signed)
PATIENT: [name] DOB: Jun 12, 1960  REASON FOR VISIT: follow up HISTORY FROM: patient  HISTORY OF PRESENT ILLNESS: Today 10/23/16  [name] is a 56 year old female with a history of obstructive sleep apnea on CPAP. She returns today for a compliance download. Her download indicates that she uses her machine 30 out of 30 days for compliance of 100%. She uses her machine greater than 4 hours each night. On average she uses her machine 9 hours and 7 minutes. Her residual AHI is 0.2 on 9 cm water with EPR of 3. She does not have a significant leak. Overall she feels that she is doing well. Reports that she is tolerating CPAP well. She does state that she suffers from chronic migraines and sees Dr. Domingo Cocking at the headache clinic. She returns today for an evaluation.  HISTORY 10/23/16 Copied from Dr. Guadelupe Sabin notes: [name] is a 56 year old right-handed woman with an underlying medical history of hypertension, hypothyroidism, migraine headaches, allergic rhinitis, plantar fasciitis, cervical disc disease with myelopathy, status post neck surgery on 03/19/2013 under Dr. Sherwood Gambler, hyperlipidemia, and obesity, who was previously diagnosed with obstructive sleep apnea and placed on CPAP therapy. She has a CPAP machine which she has been using, but some parts are defective, including the humidier and the power button and therefore, the machine does not always work. She needs an updated machine. Sleep study testing was in or around 2010. Prior sleep study results are not available for my review today. CPAP compliance download was reviewed from 01/24/2016 through 04/22/2016, which is a total of 90 days, during which time she used her CPAP every night with percent used days greater than 4 hours at 100%, average usage of 9 hours and 5 minutes, residual AHI 1 per hour, pressure at 9 cm with EPR. I reviewed your office note from 05/01/2016, which you kindly included. Her DME company is [facility].  Her Epworth sleepiness score is 9 out of 24 today, fatigue score is 51 out of 63. Her bedtime is around MN to 1 AM and she has been taking ambien nearly nightly. She reports recent increase in stress in the past few months. She has had weight gain as well. Her wakeup time is usually between 9:30 and 10. She has nocturia once per average night. She has occasional morning headaches. For her migraines she goes to the [facility] and gets Botox injections. She reports a possible family history of obstructive sleep apnea in her father. She denies restless leg symptoms. She is a nonsmoker and drinks alcohol very occasionally, once every week or every 2 weeks. She does not drink caffeine daily. She does not work. She home schooled her 3 children. She lives at home with her husband and currently her middle daughter is back home. She has an older son and a younger son as well. She does not watch TV in her bedroom. They have a dog, not in their bedroom at night.    REVIEW OF SYSTEMS: Out of a complete 14 system review of symptoms, the patient complains only of the following symptoms, and all other reviewed systems are negative.  See history of present illness  ALLERGIES: Allergies  Allergen Reactions  . Effexor [Venlafaxine Hcl]     Weight loss and cant sleep  . Flexeril [Cyclobenzaprine] Hives    HOME MEDICATIONS: Outpatient Medications Prior to Visit  Medication Sig Dispense Refill  . calcitonin, salmon, (MIACALCIN/FORTICAL) 200 UNIT/ACT nasal spray   0  . Cholecalciferol (  VITAMIN D3) 5000 units CAPS Take by mouth.    Marland Kitchen HYDROcodone-ibuprofen (VICOPROFEN) 7.5-200 MG per tablet Take 1 tablet by mouth every 8 (eight) hours as needed for moderate pain. 30 tablet 0  . hydrOXYzine (ATARAX/VISTARIL) 10 MG tablet take 1 to 3 tablets by mouth every 6 hours if needed for itching    . ibuprofen (ADVIL) 200 MG tablet Take 1 tablet (200 mg total) by mouth every 6 (six) hours as needed. 30 tablet 0    . Lidocaine 0.5 % AERO Apply topically.    . naproxen sodium (ANAPROX) 220 MG tablet Take 220 mg by mouth.    . OnabotulinumtoxinA (BOTOX IM) Inject into the muscle.    . rizatriptan (MAXALT-MLT) 10 MG disintegrating tablet Take 10 mg by mouth.    . thyroid (ARMOUR THYROID) 30 MG tablet Take 30 mg by mouth.    . TOPROL XL 100 MG 24 hr tablet     . Valerian Root 100 MG CAPS Take by mouth.    . zolpidem (AMBIEN) 10 MG tablet   0   No facility-administered medications prior to visit.     PAST MEDICAL HISTORY: Past Medical History:  Diagnosis Date  . Back pain of lumbar region with sciatica   . Family history of anesthesia complication    grandfather died under anesthesia 7's- had heart issues  . H/O carpal tunnel repair 14  . Headache(784.0)   . Hypertension   . Hypothyroidism   . PONV (postoperative nausea and vomiting)   . Sleep apnea    cpap since 10    PAST SURGICAL HISTORY: Past Surgical History:  Procedure Laterality Date  . CERVICAL DISC ARTHROPLASTY N/A 03/19/2013   Procedure: CERVICAL FIVE TO SIX, CERVICAL SIX TO SEVEN CERVICAL ANTERIOR DISC ARTHROPLASTY;  Surgeon: Kristeen Miss, MD;  Location: [facility];  Service: Neurosurgery;  Laterality: N/A;  C56 C67 artificial disc replacement  . CESAREAN SECTION    . COLONOSCOPY    . DILATATION & CURETTAGE/HYSTEROSCOPY WITH TRUECLEAR N/A 11/25/2013   Procedure: DILATATION & CURETTAGE/HYSTEROSCOPY WITH TRUCLEAR;  Surgeon: Margarette Asal, MD;  Location: [facility];  Service: Gynecology;  Laterality: N/A;  . ECTOPIC PREGNANCY SURGERY    . GALLBLADDER SURGERY    . MOUTH SURGERY     child- dog bite  . TONSILLECTOMY    . TUBAL LIGATION      FAMILY HISTORY: Family History  Problem Relation Age of Onset  . Hyperlipidemia Mother   . Heart disease Father   . Hypertension Father   . Diabetes Father   . Cancer Maternal Grandmother        breast cancer  . Diabetes Paternal Grandmother   . Heart disease Paternal Grandfather   .  Hypertension Paternal Grandfather     SOCIAL HISTORY: Social History   Social History  . Marital status: Married    Spouse name: N/A  . Number of children: 3  . Years of education: college   Occupational History  . N/A    Social History Main Topics  . Smoking status: Never Smoker  . Smokeless tobacco: Never Used     Comment: occ alcohol  . Alcohol use Yes     Comment: socially  . Drug use: No  . Sexual activity: Not on file   Other Topics Concern  . Not on file   Social History Narrative   Denies caffeine use       PHYSICAL EXAM  Vitals:   10/23/16 1120  BP:  135/79  Pulse: 67  Weight: 188 lb 3.2 oz (85.4 kg)  Height: 5' 2.25" (1.581 m)   Body mass index is 34.15 kg/m.  Generalized: Well developed, in no acute distress   Neurological examination  Mentation: Alert oriented to time, place, history taking. Follows all commands speech and language fluent Cranial nerve II-XII: Pupils were equal round reactive to light. Extraocular movements were full, visual field were full on confrontational test. Facial sensation and strength were normal. Uvula tongue midline. Head turning and shoulder shrug  were normal and symmetric. Motor: The motor testing reveals 5 over 5 strength of all 4 extremities. Good symmetric motor tone is noted throughout.  Sensory: Sensory testing is intact to soft touch on all 4 extremities. No evidence of extinction is noted.  Coordination: Cerebellar testing reveals good finger-nose-finger and heel-to-shin bilaterally.  Gait and station: Gait is normal.   DIAGNOSTIC DATA (LABS, IMAGING, TESTING) - I reviewed patient records, labs, notes, testing and imaging myself where available.  Lab Results  Component Value Date   WBC 6.2 11/22/2013   HGB 14.6 11/22/2013   HCT 42.3 11/22/2013   MCV 86.2 11/22/2013   PLT 220 11/22/2013      Component Value Date/Time   NA 137 11/22/2013 1022   K 4.4 11/22/2013 1022   CL 101 11/22/2013 1022   CO2 28  11/22/2013 1022   GLUCOSE 86 11/22/2013 1022   BUN 16 11/22/2013 1022   CREATININE 0.61 11/22/2013 1022   CALCIUM 9.1 11/22/2013 1022   GFRNONAA >90 11/22/2013 1022   GFRAA >90 11/22/2013 1022      ASSESSMENT AND PLAN 56 y.o. year old female  has a past medical history of Back pain of lumbar region with sciatica; Family history of anesthesia complication; H/O carpal tunnel repair (14); Headache(784.0); Hypertension; Hypothyroidism; PONV (postoperative nausea and vomiting); and Sleep apnea. here with:  1. Obstructive sleep apnea on CPAP  Overall the patient is doing well. CPAP download shows excellent compliance and good treatment of her apnea. She is encouraged to continue using the CPAP nightly. She is advised that if her symptoms worsen or she develops new symptoms she should let us know. She will follow-up in 6 months or sooner if needed.  I spent 15 minutes with the patient. 50% of this time was spent reviewing CPAP download.     Ward Givens, MSN, NP-C 10/23/2016, 11:16 AM [facility] [address] [place], [place] [address] [phone]

## 2016-10-23 NOTE — Patient Instructions (Signed)
Your Plan:  Continue using CPAP nightly If your symptoms worsen or you develop new symptoms please let us know.   Thank you for coming to see us at [facility]. I hope we have been able to provide you high quality care today.  You may receive a patient satisfaction survey over the next few weeks. We would appreciate your feedback and comments so that we may continue to improve ourselves and the health of our patients.  

## 2016-10-24 ENCOUNTER — Ambulatory Visit: Payer: Self-pay | Admitting: Neurology

## 2016-10-24 ENCOUNTER — Ambulatory Visit: Payer: 59 | Admitting: Neurology

## 2016-10-30 DIAGNOSIS — M4316 Spondylolisthesis, lumbar region: Secondary | ICD-10-CM | POA: Diagnosis not present

## 2016-10-30 DIAGNOSIS — M7138 Other bursal cyst, other site: Secondary | ICD-10-CM | POA: Diagnosis not present

## 2016-10-30 DIAGNOSIS — M47816 Spondylosis without myelopathy or radiculopathy, lumbar region: Secondary | ICD-10-CM | POA: Diagnosis not present

## 2016-11-07 DIAGNOSIS — G4733 Obstructive sleep apnea (adult) (pediatric): Secondary | ICD-10-CM | POA: Diagnosis not present

## 2016-12-03 DIAGNOSIS — M4316 Spondylolisthesis, lumbar region: Secondary | ICD-10-CM | POA: Diagnosis not present

## 2016-12-03 DIAGNOSIS — M4726 Other spondylosis with radiculopathy, lumbar region: Secondary | ICD-10-CM | POA: Diagnosis not present

## 2016-12-03 DIAGNOSIS — M48061 Spinal stenosis, lumbar region without neurogenic claudication: Secondary | ICD-10-CM | POA: Diagnosis not present

## 2016-12-03 DIAGNOSIS — M5136 Other intervertebral disc degeneration, lumbar region: Secondary | ICD-10-CM | POA: Diagnosis not present

## 2016-12-04 DIAGNOSIS — G43839 Menstrual migraine, intractable, without status migrainosus: Secondary | ICD-10-CM | POA: Diagnosis not present

## 2016-12-04 DIAGNOSIS — G43719 Chronic migraine without aura, intractable, without status migrainosus: Secondary | ICD-10-CM | POA: Diagnosis not present

## 2016-12-04 DIAGNOSIS — R51 Headache: Secondary | ICD-10-CM | POA: Diagnosis not present

## 2016-12-08 DIAGNOSIS — G4733 Obstructive sleep apnea (adult) (pediatric): Secondary | ICD-10-CM | POA: Diagnosis not present

## 2016-12-11 ENCOUNTER — Ambulatory Visit: Payer: Self-pay | Admitting: Neurology

## 2016-12-19 ENCOUNTER — Telehealth: Payer: Self-pay | Admitting: Neurology

## 2016-12-19 NOTE — Telephone Encounter (Signed)
Received this notice from [facility]: "I called her and she is taken care of. Thank you "

## 2016-12-19 NOTE — Telephone Encounter (Signed)
Pt called she has not been contacted by [facility]. She has not been able to order any supplies and have not been either. Pt said she has left numerous msg but is not being called back. Please call to advise

## 2016-12-19 NOTE — Telephone Encounter (Signed)
I have reached out to [facility] to find out what is going on. 

## 2016-12-23 DIAGNOSIS — G4733 Obstructive sleep apnea (adult) (pediatric): Secondary | ICD-10-CM | POA: Diagnosis not present

## 2017-01-07 DIAGNOSIS — G4733 Obstructive sleep apnea (adult) (pediatric): Secondary | ICD-10-CM | POA: Diagnosis not present

## 2017-02-07 DIAGNOSIS — G4733 Obstructive sleep apnea (adult) (pediatric): Secondary | ICD-10-CM | POA: Diagnosis not present

## 2017-02-24 DIAGNOSIS — M79674 Pain in right toe(s): Secondary | ICD-10-CM | POA: Diagnosis not present

## 2017-02-24 DIAGNOSIS — M79675 Pain in left toe(s): Secondary | ICD-10-CM | POA: Diagnosis not present

## 2017-02-24 DIAGNOSIS — L03031 Cellulitis of right toe: Secondary | ICD-10-CM | POA: Diagnosis not present

## 2017-03-05 DIAGNOSIS — R51 Headache: Secondary | ICD-10-CM | POA: Diagnosis not present

## 2017-03-05 DIAGNOSIS — G43719 Chronic migraine without aura, intractable, without status migrainosus: Secondary | ICD-10-CM | POA: Diagnosis not present

## 2017-03-05 DIAGNOSIS — G43839 Menstrual migraine, intractable, without status migrainosus: Secondary | ICD-10-CM | POA: Diagnosis not present

## 2017-03-10 DIAGNOSIS — G4733 Obstructive sleep apnea (adult) (pediatric): Secondary | ICD-10-CM | POA: Diagnosis not present

## 2017-03-17 DIAGNOSIS — L600 Ingrowing nail: Secondary | ICD-10-CM | POA: Diagnosis not present

## 2017-03-28 DIAGNOSIS — M4726 Other spondylosis with radiculopathy, lumbar region: Secondary | ICD-10-CM | POA: Diagnosis not present

## 2017-03-28 DIAGNOSIS — M48061 Spinal stenosis, lumbar region without neurogenic claudication: Secondary | ICD-10-CM | POA: Diagnosis not present

## 2017-03-28 DIAGNOSIS — M5416 Radiculopathy, lumbar region: Secondary | ICD-10-CM | POA: Diagnosis not present

## 2017-03-28 DIAGNOSIS — M5136 Other intervertebral disc degeneration, lumbar region: Secondary | ICD-10-CM | POA: Diagnosis not present

## 2017-04-07 DIAGNOSIS — G4733 Obstructive sleep apnea (adult) (pediatric): Secondary | ICD-10-CM | POA: Diagnosis not present

## 2017-04-08 DIAGNOSIS — I10 Essential (primary) hypertension: Secondary | ICD-10-CM | POA: Diagnosis not present

## 2017-04-08 DIAGNOSIS — R82998 Other abnormal findings in urine: Secondary | ICD-10-CM | POA: Diagnosis not present

## 2017-04-08 DIAGNOSIS — R7309 Other abnormal glucose: Secondary | ICD-10-CM | POA: Diagnosis not present

## 2017-04-08 DIAGNOSIS — E038 Other specified hypothyroidism: Secondary | ICD-10-CM | POA: Diagnosis not present

## 2017-04-10 DIAGNOSIS — M4316 Spondylolisthesis, lumbar region: Secondary | ICD-10-CM | POA: Diagnosis not present

## 2017-04-15 DIAGNOSIS — Z6834 Body mass index (BMI) 34.0-34.9, adult: Secondary | ICD-10-CM | POA: Diagnosis not present

## 2017-04-15 DIAGNOSIS — Z0000 Encounter for general adult medical examination without abnormal findings: Secondary | ICD-10-CM | POA: Diagnosis not present

## 2017-04-15 DIAGNOSIS — N3281 Overactive bladder: Secondary | ICD-10-CM | POA: Diagnosis not present

## 2017-04-15 DIAGNOSIS — R7309 Other abnormal glucose: Secondary | ICD-10-CM | POA: Diagnosis not present

## 2017-04-15 DIAGNOSIS — M545 Low back pain: Secondary | ICD-10-CM | POA: Diagnosis not present

## 2017-04-16 ENCOUNTER — Encounter: Payer: Self-pay | Admitting: Neurology

## 2017-04-16 ENCOUNTER — Other Ambulatory Visit: Payer: Self-pay | Admitting: Internal Medicine

## 2017-04-16 DIAGNOSIS — E785 Hyperlipidemia, unspecified: Secondary | ICD-10-CM

## 2017-04-18 DIAGNOSIS — Z1212 Encounter for screening for malignant neoplasm of rectum: Secondary | ICD-10-CM | POA: Diagnosis not present

## 2017-04-21 ENCOUNTER — Encounter: Payer: Self-pay | Admitting: Neurology

## 2017-04-21 ENCOUNTER — Ambulatory Visit: Payer: 59 | Admitting: Neurology

## 2017-04-21 ENCOUNTER — Other Ambulatory Visit (HOSPITAL_COMMUNITY): Payer: Self-pay | Admitting: Neurological Surgery

## 2017-04-21 VITALS — BP 170/92 | HR 72 | Ht 63.0 in | Wt 191.0 lb

## 2017-04-21 DIAGNOSIS — Z9989 Dependence on other enabling machines and devices: Secondary | ICD-10-CM

## 2017-04-21 DIAGNOSIS — M4316 Spondylolisthesis, lumbar region: Secondary | ICD-10-CM

## 2017-04-21 DIAGNOSIS — G4733 Obstructive sleep apnea (adult) (pediatric): Secondary | ICD-10-CM | POA: Diagnosis not present

## 2017-04-21 NOTE — Patient Instructions (Addendum)
You have been fully compliant with your CPAP. Keep up the good work! We can see you in 1 year, you can see Jinny Blossom again next time, as you are stable. I will see you after that.

## 2017-04-21 NOTE — Progress Notes (Signed)
Subjective:    Patient ID: [name] is a 57 y.o. female.  HPI     Interim history:   [name] is a 57 year old right-handed woman with an underlying medical history of hypertension, hypothyroidism, migraine headaches, allergic rhinitis, plantar fasciitis, cervical disc disease with myelopathy, status post neck surgery on 03/19/2013 under Dr. Ellene Route, hyperlipidemia, and obesity, who presents for follow-up consultation of her obstructive sleep apnea, on CPAP therapy. The patient is unaccompanied today. I first met her on 06/03/2013 at the request of her primary care physician, at which time she reported a prior diagnosis of OSA and she was using CPAP but her machine was defective and she needed reevaluation as she had not had a sleep study in over 8 years. She was compliant with her CPAP at the time. She was invited for sleep study testing. She had a baseline sleep study on 06/18/2016, which showed a sleep efficiency of 77.3%, sleep latency of 37 minutes. She had a decreased percentage of REM sleep at an increased percentage of stage II sleep. Overall AHI was 0.2 per hour, REM AHI was 3.3 per hour, supine AHI was 0 per hour. Average oxygen saturation was 90%, nadir was 93%. She had no significant PLMS. She was offered a home sleep test, which she had on 07/10/2016. AHI was 6.7 per hour, average oxygen saturation 94%, nadir of 78%. Based on the home sleep test results she was placed on CPAP therapy at home. She had a compliance follow-up appointment with Ward Givens, nurse practitioner on 10/23/2016 at which time she was doing well and compliant with CPAP therapy.  Today, 04/21/2017: I reviewed her CPAP compliance data from 03/18/2017 through 04/16/2017 which is a total of 30 days, during which time she used her CPAP every night with percent used days greater than 4 hours at 100%, indicating superb compliance with an average usage of 8 hours and 35 minutes, residual AHI at goal at 0.2 per hour,  leak acceptable with the 95th percentile at 10 L/m on a pressure of 9 cm with EPR of 3. She reports doing well with the CPAP, fully compliant with the new CPAP. Had recent physical with her primary care physician. She is scheduled for a cardiac CT.  Previously:   06/03/2016: (She) was previously diagnosed with obstructive sleep apnea and placed on CPAP therapy. She has a CPAP machine which she has been using, but some parts are defective, including the humidier and the power button and therefore, the machine does not always work. She needs an updated machine. Sleep study testing was in or around 2010. Prior sleep study results are not available for my review today. CPAP compliance download was reviewed from 01/24/2016 through 04/22/2016, which is a total of 90 days, during which time she used her CPAP every night with percent used days greater than 4 hours at 100%, average usage of 9 hours and 5 minutes, residual AHI 1 per hour, pressure at 9 cm with EPR. I reviewed your office note from 05/01/2016, which you kindly included. Her DME company is [facility]. Her Epworth sleepiness score is 9 out of 24 today, fatigue score is 51 out of 63. Her bedtime is around MN to 1 AM and she has been taking ambien nearly nightly. She reports recent increase in stress in the past few months. She has had weight gain as well. Her wakeup time is usually between 9:30 and 10. She has nocturia once per average night. She has occasional morning  headaches. For her migraines [facility] and gets Botox injections. She reports a possible family history of obstructive sleep apnea in her father. She denies restless leg symptoms. She is a nonsmoker and drinks alcohol very occasionally, once every week or every 2 weeks. She does not drink caffeine daily. She does not work. She home schooled her 3 children. She lives at home with her husband and currently her middle daughter is back home. She has an older  son and a younger son as well. She does not watch TV in her bedroom. They have a dog, not in their bedroom at night.    Her Past Medical History Is Significant For: Past Medical History:  Diagnosis Date  . Back pain of lumbar region with sciatica   . Family history of anesthesia complication    grandfather died under anesthesia 49's- had heart issues  . H/O carpal tunnel repair 14  . Headache(784.0)   . Hypertension   . Hypothyroidism   . PONV (postoperative nausea and vomiting)   . Sleep apnea    cpap since 10    Her Past Surgical History Is Significant For: Past Surgical History:  Procedure Laterality Date  . CERVICAL DISC ARTHROPLASTY N/A 03/19/2013   Procedure: CERVICAL FIVE TO SIX, CERVICAL SIX TO SEVEN CERVICAL ANTERIOR DISC ARTHROPLASTY;  Surgeon: Kristeen Miss, MD;  Location: [facility];  Service: Neurosurgery;  Laterality: N/A;  C56 C67 artificial disc replacement  . CESAREAN SECTION    . COLONOSCOPY    . DILATATION & CURETTAGE/HYSTEROSCOPY WITH TRUECLEAR N/A 11/25/2013   Procedure: DILATATION & CURETTAGE/HYSTEROSCOPY WITH TRUCLEAR;  Surgeon: Margarette Asal, MD;  Location: [facility];  Service: Gynecology;  Laterality: N/A;  . ECTOPIC PREGNANCY SURGERY    . GALLBLADDER SURGERY    . MOUTH SURGERY     child- dog bite  . TONSILLECTOMY    . TUBAL LIGATION      Her Family History Is Significant For: Family History  Problem Relation Age of Onset  . Hyperlipidemia Mother   . Heart disease Father   . Hypertension Father   . Diabetes Father   . Cancer Maternal Grandmother        breast cancer  . Diabetes Paternal Grandmother   . Heart disease Paternal Grandfather   . Hypertension Paternal Grandfather     Her Social History Is Significant For: Social History   Socioeconomic History  . Marital status: Married    Spouse name: Not on file  . Number of children: 3  . Years of education: college  . Highest education level: Not on file  Occupational History  .  Occupation: N/A  Social Needs  . Financial resource strain: Not on file  . Food insecurity:    Worry: Not on file    Inability: Not on file  . Transportation needs:    Medical: Not on file    Non-medical: Not on file  Tobacco Use  . Smoking status: Never Smoker  . Smokeless tobacco: Never Used  . Tobacco comment: occ alcohol  Substance and Sexual Activity  . Alcohol use: Yes    Comment: socially  . Drug use: No  . Sexual activity: Not on file  Lifestyle  . Physical activity:    Days per week: Not on file    Minutes per session: Not on file  . Stress: Not on file  Relationships  . Social connections:    Talks on phone: Not on file  Gets together: Not on file    Attends religious service: Not on file    Active member of club or organization: Not on file    Attends meetings of clubs or organizations: Not on file    Relationship status: Not on file  Other Topics Concern  . Not on file  Social History Narrative   Denies caffeine use     Her Allergies Are:  Allergies  Allergen Reactions  . Effexor [Venlafaxine Hcl]     Weight loss and cant sleep  . Flexeril [Cyclobenzaprine] Hives  :   Her Current Medications Are:  Outpatient Encounter Medications as of 04/21/2017  Medication Sig  . baclofen (LIORESAL) 10 MG tablet Take 10 mg by mouth daily.   . Cholecalciferol (VITAMIN D3) 5000 units CAPS Take by mouth.  Marland Kitchen HYDROcodone-ibuprofen (VICOPROFEN) 7.5-200 MG per tablet Take 1 tablet by mouth every 8 (eight) hours as needed for moderate pain.  . hydrOXYzine (ATARAX/VISTARIL) 10 MG tablet take 1 to 3 tablets by mouth every 6 hours if needed for itching  . ibuprofen (ADVIL) 200 MG tablet Take 1 tablet (200 mg total) by mouth every 6 (six) hours as needed.  . Lidocaine 0.5 % AERO Apply topically.  . methocarbamol (ROBAXIN) 500 MG tablet Take 500 mg by mouth.   . naproxen sodium (ANAPROX) 220 MG tablet Take 220 mg by mouth.  . OnabotulinumtoxinA (BOTOX IM) Inject into the  muscle.  . rizatriptan (MAXALT-MLT) 10 MG disintegrating tablet Take 10 mg by mouth.  . thyroid (ARMOUR THYROID) 30 MG tablet Take 30 mg by mouth.  Marland Kitchen TIZANIDINE HCL PO Take by mouth.  . TOPROL XL 100 MG 24 hr tablet   . UNABLE TO FIND Med Name: Bonney Leitz calcium supplement ?1254m daily  . Valerian Root 100 MG CAPS Take by mouth.  . zolpidem (AMBIEN) 10 MG tablet    No facility-administered encounter medications on file as of 04/21/2017.   :  Review of Systems:  Out of a complete 14 point review of systems, all are reviewed and negative with the exception of these symptoms as listed below: Review of Systems  Neurological:       Pt presents today to discuss her cpap. Pt reports that it is going well.    Objective:  Neurological Exam  Physical Exam Physical Examination:   Vitals:   04/21/17 1406  BP: (!) 170/92  Pulse: 72    General Examination: The patient is a very pleasant 57y.o. female in no acute distress. She appears well-developed and well-nourished and well groomed.   HEENT: Normocephalic, atraumatic, pupils are equal, round and reactive to light and accommodation. Corrective eye glasses in place. Extraocular tracking is good without limitation to gaze excursion or nystagmus noted. Normal smooth pursuit is noted. Hearing is grossly intact. Face is symmetric with normal facial animation. Speech is clear with no dysarthria noted. There is no hypophonia. There is no lip, neck/head, jaw or voice tremor. Neck is supple with full range of passive and active motion. Oropharynx exam reveals: no new changes.   Chest: Clear to auscultation without wheezing, rhonchi or crackles noted.  Heart: S1+S2+0, regular and normal without murmurs, rubs or gallops noted.   Abdomen: Soft, non-tender and non-distended with normal bowel sounds appreciated on auscultation.  Extremities: There is no pitting edema in the distal lower extremities bilaterally. Pedal pulses are intact.  Skin:  Warm and dry without trophic changes noted.  Musculoskeletal: exam reveals no obvious joint deformities, tenderness or joint  swelling or erythema.   Neurologically:  Mental status: The patient is awake, alert and oriented in all 4 spheres. Her immediate and remote memory, attention, language skills and fund of knowledge are appropriate. There is no evidence of aphasia, agnosia, apraxia or anomia. Speech is clear with normal prosody and enunciation. Thought process is linear. Mood is normal and affect is normal.  Cranial nerves II - XII are as described above under HEENT exam. In addition: shoulder shrug is normal with equal shoulder height noted. Motor exam: Normal bulk, strength and tone is noted. There is no tremor. Reflexes are 1+ throughout. Fine motor skills and coordination: grossly intact.  Cerebellar testing: No dysmetria or intention tremor. There is no truncal or gait ataxia.  Sensory exam: intact to light touch, and temp in the upper and lower extremities.  Gait, station and balance: She stands easily. No veering to one side is noted. No leaning to one side is noted. Posture is age-appropriate and stance is narrow based. Gait shows normal stride length and normal pace. No problems turning are noted.    Assessment and Plan:  In summary, [name] is a very pleasant 57 year old female with an underlying medical history of hypertension, hypothyroidism, migraine headaches, allergic rhinitis, plantar fasciitis, cervical disc disease with myelopathy, status post neck surgery on 03/19/2013 under Dr. Ellene Route, hyperlipidemia, and obesity, who presents for follow-up consultation of her of her obstructive sleep apnea. She is compliant with CPAP at 9 cm. She is using a dreamwear nasal cradle interface size small. She is fully compliant with treatment and commended for this. She has ongoing good results. She is stable enough for yearly checkup from my end of things. She is encouraged to follow-up  in one year with one of our nurse practitioners. I answered all her questions today and she was in agreement. I spent 20 minutes in total face-to-face time with the patient, more than 50% of which was spent in counseling and coordination of care, reviewing test results, reviewing medication and discussing or reviewing the diagnosis of OSA, its prognosis and treatment options. Pertinent laboratory and imaging test results that were available during this visit with the patient were reviewed by me and considered in my medical decision making (see chart for details).

## 2017-04-22 ENCOUNTER — Other Ambulatory Visit: Payer: Self-pay | Admitting: Neurological Surgery

## 2017-04-28 MED ORDER — SODIUM CHLORIDE 0.9 % IV SOLN: 4.0000 mg | Freq: Four times a day (QID) | INTRAVENOUS | Status: AC | PRN

## 2017-04-29 ENCOUNTER — Ambulatory Visit
Admission: RE | Admit: 2017-04-29 | Discharge: 2017-04-29 | Disposition: A | Discharge status: Home / Self Care | Payer: 59 | Source: Ambulatory Visit | Attending: Internal Medicine | Admitting: Internal Medicine

## 2017-04-29 DIAGNOSIS — E785 Hyperlipidemia, unspecified: Secondary | ICD-10-CM

## 2017-05-08 DIAGNOSIS — G4733 Obstructive sleep apnea (adult) (pediatric): Secondary | ICD-10-CM | POA: Diagnosis not present

## 2017-05-15 ENCOUNTER — Ambulatory Visit (HOSPITAL_COMMUNITY)
Admission: RE | Admit: 2017-05-15 | Discharge: 2017-05-15 | Disposition: A | Discharge status: Home / Self Care | Payer: 59 | Source: Ambulatory Visit | Attending: Neurological Surgery | Admitting: Neurological Surgery

## 2017-05-15 ENCOUNTER — Other Ambulatory Visit (HOSPITAL_COMMUNITY): Payer: 59

## 2017-05-15 DIAGNOSIS — M4316 Spondylolisthesis, lumbar region: Secondary | ICD-10-CM

## 2017-05-15 DIAGNOSIS — M7138 Other bursal cyst, other site: Secondary | ICD-10-CM | POA: Diagnosis not present

## 2017-05-15 MED ORDER — HYDROCODONE-ACETAMINOPHEN 5-325 MG PO TABS
ORAL_TABLET | ORAL | Status: AC
  Filled 2017-05-15: qty 1

## 2017-05-15 MED ORDER — HYDROCODONE-ACETAMINOPHEN 5-325 MG PO TABS
1.0000 | ORAL_TABLET | ORAL | Status: DC | PRN
  Administered 2017-05-15: 1 via ORAL

## 2017-05-15 MED ORDER — DIAZEPAM 5 MG PO TABS
10.0000 mg | ORAL_TABLET | Freq: Once | ORAL | Status: AC
  Administered 2017-05-15: 10 mg via ORAL

## 2017-05-15 MED ORDER — LIDOCAINE HCL (PF) 1 % IJ SOLN
INTRAMUSCULAR | Status: AC
  Administered 2017-05-15: 5 mL via INTRADERMAL
  Filled 2017-05-15: qty 5

## 2017-05-15 MED ORDER — LIDOCAINE HCL (PF) 1 % IJ SOLN
5.0000 mL | Freq: Once | INTRAMUSCULAR | Status: AC
  Administered 2017-05-15: 5 mL via INTRADERMAL

## 2017-05-15 MED ORDER — ONDANSETRON HCL 4 MG/2ML IJ SOLN: 4.0000 mg | Freq: Four times a day (QID) | INTRAMUSCULAR | Status: DC | PRN

## 2017-05-15 MED ORDER — IOPAMIDOL (ISOVUE-M 200) INJECTION 41%
INTRAMUSCULAR | Status: AC
  Administered 2017-05-15: 12 mL via INTRATHECAL
  Filled 2017-05-15: qty 10

## 2017-05-15 MED ORDER — IOPAMIDOL (ISOVUE-M 200) INJECTION 41%
20.0000 mL | Freq: Once | INTRAMUSCULAR | Status: AC
  Administered 2017-05-15: 12 mL via INTRATHECAL

## 2017-05-15 MED ORDER — DIAZEPAM 5 MG PO TABS
ORAL_TABLET | ORAL | Status: AC
  Administered 2017-05-15: 10 mg via ORAL
  Filled 2017-05-15: qty 2

## 2017-05-15 NOTE — Procedures (Signed)
[name] is a 57 year old individual who has had significant back and right lower extremity pain.  She has not spondylolisthesis at the level of L4-L5 that we have been following for several years.  She also has evidence of a small right-sided synovial cyst.  Because of the severity of the pain and its lack of response to conservative efforts and myelogram is now being performed to see if a dynamic component is noted to this problem.  Pre op Dx: Lumbar spondylolisthesis with radiculopathy Post op Dx: Lumbar spondylolisthesis with radiculopathy Procedure: Lumbar myelogram Surgeon:  Puncture level: L3-4 Fluid color: Clear colorless Injection: Isovue-200, 12 mL Findings: Spondylolisthesis L4-5 with small synovial cyst L3-4 right further evaluation with CT scanning

## 2017-05-15 NOTE — Discharge Instructions (Signed)
Myelogram, Care After °These instructions give you information about caring for yourself after your procedure. Your doctor may also give you more specific instructions. Call your doctor if you have any problems or questions after your procedure. °Follow these instructions at home: °· Drink enough fluid to keep your pee (urine) clear or pale yellow. °· Rest as told by your doctor. °· Lie flat with your head slightly raised (elevated). °· Do not bend, lift, or do any hard activities for 24-48 hours or as told by your doctor. °· Take over-the-counter and prescription medicines only as told by your doctor. °· Take care of and remove your bandage (dressing) as told by your doctor. °· Bathe or shower as told by your doctor. °Contact a health care provider if: °· You have a fever. °· You have a headache that lasts longer than 24 hours. °· You feel sick to your stomach (nauseous). °· You throw up (vomit). °· Your neck is stiff. °· Your legs feel numb. °· You cannot pee. °· You cannot poop (have a bowel movement). °· You have a rash. °· You are itchy or sneezing. °Get help right away if: °· You have new symptoms or your symptoms get worse. °· You have a seizure. °· You have trouble breathing. °This information is not intended to replace advice given to you by your health care provider. Make sure you discuss any questions you have with your health care provider. °Document Released: 10/17/2007 Document Revised: 09/07/2015 Document Reviewed: 10/20/2014 °Elsevier Interactive Patient Education © 2018 [employer] ° °

## 2017-05-21 DIAGNOSIS — M7138 Other bursal cyst, other site: Secondary | ICD-10-CM | POA: Diagnosis not present

## 2017-05-21 DIAGNOSIS — M4316 Spondylolisthesis, lumbar region: Secondary | ICD-10-CM | POA: Diagnosis not present

## 2017-06-04 DIAGNOSIS — G43719 Chronic migraine without aura, intractable, without status migrainosus: Secondary | ICD-10-CM | POA: Diagnosis not present

## 2017-06-17 DIAGNOSIS — M5136 Other intervertebral disc degeneration, lumbar region: Secondary | ICD-10-CM | POA: Diagnosis not present

## 2017-07-17 DIAGNOSIS — R51 Headache: Secondary | ICD-10-CM | POA: Diagnosis not present

## 2017-07-17 DIAGNOSIS — G43719 Chronic migraine without aura, intractable, without status migrainosus: Secondary | ICD-10-CM | POA: Diagnosis not present

## 2017-08-04 DIAGNOSIS — Z6835 Body mass index (BMI) 35.0-35.9, adult: Secondary | ICD-10-CM | POA: Diagnosis not present

## 2017-08-04 DIAGNOSIS — Z01419 Encounter for gynecological examination (general) (routine) without abnormal findings: Secondary | ICD-10-CM | POA: Diagnosis not present

## 2017-08-07 DIAGNOSIS — Z803 Family history of malignant neoplasm of breast: Secondary | ICD-10-CM | POA: Diagnosis not present

## 2017-08-07 DIAGNOSIS — Z808 Family history of malignant neoplasm of other organs or systems: Secondary | ICD-10-CM | POA: Diagnosis not present

## 2017-08-07 DIAGNOSIS — Z801 Family history of malignant neoplasm of trachea, bronchus and lung: Secondary | ICD-10-CM | POA: Diagnosis not present

## 2017-08-12 DIAGNOSIS — G43719 Chronic migraine without aura, intractable, without status migrainosus: Secondary | ICD-10-CM | POA: Diagnosis not present

## 2017-08-12 DIAGNOSIS — R51 Headache: Secondary | ICD-10-CM | POA: Diagnosis not present

## 2017-08-22 DIAGNOSIS — G4733 Obstructive sleep apnea (adult) (pediatric): Secondary | ICD-10-CM | POA: Diagnosis not present

## 2017-09-03 DIAGNOSIS — G43719 Chronic migraine without aura, intractable, without status migrainosus: Secondary | ICD-10-CM | POA: Diagnosis not present

## 2017-09-03 DIAGNOSIS — R51 Headache: Secondary | ICD-10-CM | POA: Diagnosis not present

## 2017-09-15 DIAGNOSIS — Z809 Family history of malignant neoplasm, unspecified: Secondary | ICD-10-CM | POA: Diagnosis not present

## 2017-09-17 ENCOUNTER — Other Ambulatory Visit: Payer: Self-pay | Admitting: Obstetrics and Gynecology

## 2017-09-17 DIAGNOSIS — Z803 Family history of malignant neoplasm of breast: Secondary | ICD-10-CM

## 2017-09-24 ENCOUNTER — Inpatient Hospital Stay
Admission: RE | Admit: 2017-09-24 | Discharge: 2017-09-24 | Disposition: A | Discharge status: Home / Self Care | Payer: No Typology Code available for payment source | Source: Ambulatory Visit | Attending: Obstetrics and Gynecology | Admitting: Obstetrics and Gynecology

## 2017-10-01 ENCOUNTER — Ambulatory Visit
Admission: RE | Admit: 2017-10-01 | Discharge: 2017-10-01 | Disposition: A | Discharge status: Home / Self Care | Payer: No Typology Code available for payment source | Source: Ambulatory Visit | Attending: Obstetrics and Gynecology | Admitting: Obstetrics and Gynecology

## 2017-10-01 DIAGNOSIS — Z803 Family history of malignant neoplasm of breast: Secondary | ICD-10-CM

## 2017-10-01 MED ORDER — GADOBENATE DIMEGLUMINE 529 MG/ML IV SOLN
19.0000 mL | Freq: Once | INTRAVENOUS | Status: AC | PRN
  Administered 2017-10-01: 19 mL via INTRAVENOUS

## 2017-10-03 ENCOUNTER — Other Ambulatory Visit: Payer: Self-pay | Admitting: Obstetrics and Gynecology

## 2017-10-03 DIAGNOSIS — R928 Other abnormal and inconclusive findings on diagnostic imaging of breast: Secondary | ICD-10-CM

## 2017-10-09 DIAGNOSIS — G43719 Chronic migraine without aura, intractable, without status migrainosus: Secondary | ICD-10-CM | POA: Diagnosis not present

## 2017-10-09 DIAGNOSIS — R51 Headache: Secondary | ICD-10-CM | POA: Diagnosis not present

## 2017-10-10 ENCOUNTER — Ambulatory Visit
Admission: RE | Admit: 2017-10-10 | Discharge: 2017-10-10 | Disposition: A | Discharge status: Home / Self Care | Payer: 59 | Source: Ambulatory Visit | Attending: Obstetrics and Gynecology | Admitting: Obstetrics and Gynecology

## 2017-10-10 ENCOUNTER — Ambulatory Visit
Admission: RE | Admit: 2017-10-10 | Discharge: 2017-10-10 | Disposition: A | Discharge status: Home / Self Care | Payer: No Typology Code available for payment source | Source: Ambulatory Visit | Attending: Obstetrics and Gynecology | Admitting: Obstetrics and Gynecology

## 2017-10-10 DIAGNOSIS — R928 Other abnormal and inconclusive findings on diagnostic imaging of breast: Secondary | ICD-10-CM

## 2017-10-10 DIAGNOSIS — D0512 Intraductal carcinoma in situ of left breast: Secondary | ICD-10-CM | POA: Diagnosis not present

## 2017-10-10 MED ORDER — GADOBENATE DIMEGLUMINE 529 MG/ML IV SOLN
20.0000 mL | Freq: Once | INTRAVENOUS | Status: AC | PRN
  Administered 2017-10-10: 20 mL via INTRAVENOUS

## 2017-10-17 DIAGNOSIS — Z1389 Encounter for screening for other disorder: Secondary | ICD-10-CM | POA: Diagnosis not present

## 2017-10-17 DIAGNOSIS — Z23 Encounter for immunization: Secondary | ICD-10-CM | POA: Diagnosis not present

## 2017-10-17 DIAGNOSIS — E7849 Other hyperlipidemia: Secondary | ICD-10-CM | POA: Diagnosis not present

## 2017-10-17 DIAGNOSIS — E038 Other specified hypothyroidism: Secondary | ICD-10-CM | POA: Diagnosis not present

## 2017-10-17 DIAGNOSIS — I10 Essential (primary) hypertension: Secondary | ICD-10-CM | POA: Diagnosis not present

## 2017-10-22 DIAGNOSIS — D0512 Intraductal carcinoma in situ of left breast: Secondary | ICD-10-CM | POA: Diagnosis not present

## 2017-10-27 DIAGNOSIS — D0512 Intraductal carcinoma in situ of left breast: Secondary | ICD-10-CM | POA: Diagnosis not present

## 2017-10-31 DIAGNOSIS — M5136 Other intervertebral disc degeneration, lumbar region: Secondary | ICD-10-CM | POA: Diagnosis not present

## 2017-11-04 DIAGNOSIS — D0512 Intraductal carcinoma in situ of left breast: Secondary | ICD-10-CM | POA: Diagnosis not present

## 2017-11-07 ENCOUNTER — Encounter: Payer: Self-pay | Admitting: *Deleted

## 2017-11-07 DIAGNOSIS — G4700 Insomnia, unspecified: Secondary | ICD-10-CM | POA: Insufficient documentation

## 2017-11-07 DIAGNOSIS — F32A Depression, unspecified: Secondary | ICD-10-CM

## 2017-11-07 DIAGNOSIS — F411 Generalized anxiety disorder: Secondary | ICD-10-CM | POA: Insufficient documentation

## 2017-11-07 DIAGNOSIS — F329 Major depressive disorder, single episode, unspecified: Secondary | ICD-10-CM

## 2017-11-10 DIAGNOSIS — D0512 Intraductal carcinoma in situ of left breast: Secondary | ICD-10-CM | POA: Diagnosis not present

## 2017-11-10 DIAGNOSIS — Z853 Personal history of malignant neoplasm of breast: Secondary | ICD-10-CM | POA: Diagnosis not present

## 2017-11-18 ENCOUNTER — Ambulatory Visit: Payer: Self-pay | Admitting: Psychiatry

## 2017-11-26 DIAGNOSIS — D0512 Intraductal carcinoma in situ of left breast: Secondary | ICD-10-CM | POA: Diagnosis not present

## 2017-12-01 DIAGNOSIS — Z01818 Encounter for other preprocedural examination: Secondary | ICD-10-CM | POA: Diagnosis not present

## 2017-12-01 DIAGNOSIS — D0512 Intraductal carcinoma in situ of left breast: Secondary | ICD-10-CM | POA: Diagnosis not present

## 2017-12-03 DIAGNOSIS — R51 Headache: Secondary | ICD-10-CM | POA: Diagnosis not present

## 2017-12-03 DIAGNOSIS — G43719 Chronic migraine without aura, intractable, without status migrainosus: Secondary | ICD-10-CM | POA: Diagnosis not present

## 2017-12-08 DIAGNOSIS — G4730 Sleep apnea, unspecified: Secondary | ICD-10-CM | POA: Diagnosis not present

## 2017-12-08 DIAGNOSIS — I10 Essential (primary) hypertension: Secondary | ICD-10-CM | POA: Diagnosis not present

## 2017-12-08 DIAGNOSIS — G8918 Other acute postprocedural pain: Secondary | ICD-10-CM | POA: Diagnosis not present

## 2017-12-08 DIAGNOSIS — N6011 Diffuse cystic mastopathy of right breast: Secondary | ICD-10-CM | POA: Diagnosis not present

## 2017-12-08 DIAGNOSIS — D0512 Intraductal carcinoma in situ of left breast: Secondary | ICD-10-CM | POA: Diagnosis not present

## 2017-12-08 DIAGNOSIS — Z170 Estrogen receptor positive status [ER+]: Secondary | ICD-10-CM | POA: Diagnosis not present

## 2017-12-08 DIAGNOSIS — N6021 Fibroadenosis of right breast: Secondary | ICD-10-CM | POA: Diagnosis not present

## 2017-12-08 DIAGNOSIS — Z9189 Other specified personal risk factors, not elsewhere classified: Secondary | ICD-10-CM | POA: Diagnosis not present

## 2017-12-08 DIAGNOSIS — E039 Hypothyroidism, unspecified: Secondary | ICD-10-CM | POA: Diagnosis not present

## 2017-12-08 DIAGNOSIS — R079 Chest pain, unspecified: Secondary | ICD-10-CM | POA: Diagnosis not present

## 2017-12-08 DIAGNOSIS — N6081 Other benign mammary dysplasias of right breast: Secondary | ICD-10-CM | POA: Diagnosis not present

## 2017-12-17 DIAGNOSIS — C50912 Malignant neoplasm of unspecified site of left female breast: Secondary | ICD-10-CM | POA: Diagnosis not present

## 2017-12-17 DIAGNOSIS — Z9011 Acquired absence of right breast and nipple: Secondary | ICD-10-CM | POA: Diagnosis not present

## 2017-12-23 ENCOUNTER — Telehealth: Payer: Self-pay | Admitting: Oncology

## 2017-12-23 ENCOUNTER — Encounter: Payer: Self-pay | Admitting: Oncology

## 2017-12-23 ENCOUNTER — Telehealth: Payer: Self-pay | Admitting: *Deleted

## 2017-12-23 NOTE — Telephone Encounter (Signed)
This RN spoke with pt per her call stating she is a new breast cancer patient with recent surgery at [facility] - " and Dr Ralph Leyden was referring me to be seen by Dr Jana Hakim ".  This RN noted per Care Everywhere above documentation noted per visit last week with Dr Ralph Leyden.  This RN informed pt inquiry will be made with our new pt coordinator per referral for appropriate scheduling.  Carter verbalized understanding and appreciation of call.

## 2017-12-23 NOTE — Telephone Encounter (Signed)
I cld and spoke to the pt to schedule a medonc appt for DCIS. Pt had surgery at [facility] on 11/18. [name] has been scheduled to see Dr. Jana Hakim on 12/30 at 4pm. Pt aware to arrive 30 minutes early for labs. Letter mailed.

## 2017-12-24 ENCOUNTER — Other Ambulatory Visit: Payer: Self-pay | Admitting: Oncology

## 2017-12-24 DIAGNOSIS — C50912 Malignant neoplasm of unspecified site of left female breast: Secondary | ICD-10-CM | POA: Diagnosis not present

## 2017-12-24 DIAGNOSIS — Z9011 Acquired absence of right breast and nipple: Secondary | ICD-10-CM | POA: Diagnosis not present

## 2017-12-25 DIAGNOSIS — G4730 Sleep apnea, unspecified: Secondary | ICD-10-CM | POA: Diagnosis not present

## 2017-12-25 DIAGNOSIS — N641 Fat necrosis of breast: Secondary | ICD-10-CM | POA: Diagnosis not present

## 2017-12-25 DIAGNOSIS — Z421 Encounter for breast reconstruction following mastectomy: Secondary | ICD-10-CM | POA: Diagnosis not present

## 2017-12-25 DIAGNOSIS — D0512 Intraductal carcinoma in situ of left breast: Secondary | ICD-10-CM | POA: Diagnosis not present

## 2017-12-25 DIAGNOSIS — Z853 Personal history of malignant neoplasm of breast: Secondary | ICD-10-CM | POA: Diagnosis not present

## 2017-12-25 DIAGNOSIS — I10 Essential (primary) hypertension: Secondary | ICD-10-CM | POA: Diagnosis not present

## 2018-01-07 DIAGNOSIS — C50912 Malignant neoplasm of unspecified site of left female breast: Secondary | ICD-10-CM | POA: Diagnosis not present

## 2018-01-07 DIAGNOSIS — Z9011 Acquired absence of right breast and nipple: Secondary | ICD-10-CM | POA: Diagnosis not present

## 2018-01-07 DIAGNOSIS — D0512 Intraductal carcinoma in situ of left breast: Secondary | ICD-10-CM | POA: Diagnosis not present

## 2018-01-07 DIAGNOSIS — Z9013 Acquired absence of bilateral breasts and nipples: Secondary | ICD-10-CM | POA: Diagnosis not present

## 2018-01-07 DIAGNOSIS — M25612 Stiffness of left shoulder, not elsewhere classified: Secondary | ICD-10-CM | POA: Diagnosis not present

## 2018-01-13 NOTE — Progress Notes (Signed)
[place]  Telephone:([phone] Fax:(336) [phone]     ID: [name] DOB: [birth year]  MR#: [id number]  DJM#:[id number]  Patient Care Team: Shon Baton, MD as PCP - General (Internal Medicine) , Virgie Dad, MD as Consulting Physician (Oncology) Hattie Perch, MD as Consulting Physician (Surgery) Molli Posey, MD as Consulting Physician (Obstetrics and Gynecology) Venancio Poisson, MD as Referring Physician (Plastic Surgery) OTHER MD:   CHIEF COMPLAINT: Ductal carcinoma in situ   CURRENT TREATMENT: Status post definitive surgery   HISTORY OF CURRENT ILLNESS: [name] is followed by Molli Posey for a strong family history of breast cancer and a lifetime risk of developing breast cancer calculated at 28%. She had a bilateral breast MRI with and without contrast on 10/01/2017 at [facility] showing Breast Density Category B. In the right breast, there were no suspicious rapidly enhancing masses or abnormal areas of enhancement to suggest malignancy.   In the left breast, however there was clumped linear non mass enhancement (in the upper central left breast anterior depth) measuring 1.9 cm AP with an additional similar area of clumped non mass enhancement in the upper central left breast mid to posterior depth measuring 1.4 cm. An enhancing mass in the upper slightly inner left breast between these two areas of clumped enhancement measures 0.7 cm.   Accordingly on 10/10/2017 she proceeded to biopsy of the left breast anterior upper area in question. 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  E-cadherin immunohistochemistry was positive.  Prognostic indicators significant for: estrogen receptor, 90% positive and progesterone receptor, 90% positive, both with strong staining intensity.   On the same day, a biopsy of  the left breast at the mid depth upper position was performed, also showing high grade ductal carcinoma in situ. Prognostic indicators significant for: estrogen receptor, 90% positive and progesterone receptor, 90% positive, both with strong staining intensity.  On 12/08/2017, she underwent a bilateral mastectomy with Dr. Juanda Chance at [facility]. Pathology from this procedure ([id number]) shows benign breast tissue with fibrocystic and fibroadenomatoid changes in the right breast. In the left breast, a ductal carcinoma in situ, high nuclear grade ( III ) is seen. Two left sentinel lymph nodes were removed showing as benign. One additional left-sided non-sentinel lymph node was removed, showing as benign.  Finally, on 12/25/2017 the patient underwent bilateral breast reconstruction with bilateral reduction mammoplasties and implants at [facility] under Venancio Poisson. Pathology from this procedure (Rx S [id number]) was benign.  The patient underwent genetic testing through the [facility] test at Dr. Mare Loan, which showed no deleterious mutations.   The patient's subsequent history is as detailed below.   INTERVAL HISTORY: [name] was evaluated in the breast cancer clinic on 01/19/2018 accompanied by her husband, [name].    REVIEW OF SYSTEMS: [name] has done remarkably well with her multiple biopsies and surgeries.  She denies unusual pain, fever, or breathing problems.  She was not able to exercise for a period but is planning to get back into it" soon.  She denies unusual headaches, visual changes, nausea, vomiting, stiff neck, dizziness, or gait imbalance. There has been no cough, phlegm production, or pleurisy.  Hest pain or pressure, and no change in bowel or bladder habits. The patient denies fever, rash, bleeding, unexplained fatigue or unexplained weight loss. A detailed review of systems was otherwise entirely negative.   PAST MEDICAL HISTORY: Past Medical History:  Diagnosis Date  . Back pain of lumbar  region with sciatica   . Family history of anesthesia complication    grandfather died under anesthesia 75's- had heart issues  . H/O carpal tunnel repair 14  . Headache(784.0)   . Hypertension   . Hypothyroidism   . PONV (postoperative nausea and vomiting)   . Sleep apnea    cpap since 10     PAST SURGICAL HISTORY: Past Surgical History:  Procedure Laterality Date  . CERVICAL DISC ARTHROPLASTY N/A 03/19/2013   Procedure: CERVICAL FIVE TO SIX, CERVICAL SIX TO SEVEN CERVICAL ANTERIOR DISC ARTHROPLASTY;  Surgeon: Kristeen Miss, MD;  Location: [facility];  Service: Neurosurgery;  Laterality: N/A;  C56 C67 artificial disc replacement  . CESAREAN SECTION    . COLONOSCOPY    . DILATATION & CURETTAGE/HYSTEROSCOPY WITH TRUECLEAR N/A 11/25/2013   Procedure: DILATATION & CURETTAGE/HYSTEROSCOPY WITH TRUCLEAR;  Surgeon: Margarette Asal, MD;  Location: [facility];  Service: Gynecology;  Laterality: N/A;  . ECTOPIC PREGNANCY SURGERY    . GALLBLADDER SURGERY    . MOUTH SURGERY     child- dog bite  . TONSILLECTOMY    . TUBAL LIGATION      FAMILY HISTORY: Family History  Problem Relation Age of Onset  . Hyperlipidemia Mother   . Heart disease Father   . Hypertension Father   . Diabetes Father   . Cancer Maternal Grandmother        breast cancer  . Diabetes Paternal Grandmother   . Heart disease Paternal Grandfather   . Hypertension Paternal Grandfather   . Cancer Maternal Aunt        Breast - In 78's  . Cancer Paternal Aunt        Breast - in 78's   [name]'s grandmother, maternal aunt, and a paternal aunt all head breast cancer in their 58's. Patient denies anyone in her family having ovarian cancer.    GYNECOLOGIC HISTORY:  No LMP recorded. Menarche: 57 years old Age at first live birth: 57 years old Eptopic Pregnancy: 101; lost 1 fallopian tube.  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 Her husband [name] works from home for a [employer]. Together, they have three children, [name], and [name]. [name] lives in [place], [place], and he is attending school for a degree in Counselling psychologist. [name] is 46 (as of 12/2017), lives at home, and is attending [employer] in [place]. [name] is 41 (as of 12/2017) and is attending [employer]. Naquita has no grandchildren. She attends [employer]    ADVANCED DIRECTIVES:     HEALTH MAINTENANCE: Social History   Tobacco Use  . Smoking status: Never Smoker  . Smokeless tobacco: Never Used  . Tobacco comment: occ alcohol  Substance Use Topics  . Alcohol use: Yes    Comment: socially  . Drug use: No    Colonoscopy: yes  PAP: yes  Bone density: yes   Allergies  Allergen Reactions  . Effexor [Venlafaxine Hcl]     Weight loss and cant sleep  . Flexeril [Cyclobenzaprine] Hives    Current Outpatient Medications  Medication Sig Dispense Refill  . baclofen (LIORESAL) 20 MG tablet Take 20 mg by mouth daily as needed (MIGRAINES).     . CHELATED MAGNESIUM PO  Take 300 mg by mouth daily. 2 TAB (150 MG EACH) DAILY    . HYDROcodone-ibuprofen (VICOPROFEN) 7.5-200 MG per tablet Take 1 tablet by mouth every 8 (eight) hours as needed for moderate pain. 30 tablet 0  . ibuprofen (ADVIL) 200 MG tablet Take 1 tablet (200 mg total) by mouth every 6 (six) hours as needed. (Patient taking differently: Take 200 mg by mouth every 6 (six) hours as needed for mild pain or moderate pain. ) 30 tablet 0  . MELATONIN PO Take 3-5 mg by mouth at bedtime as needed (sleep).    . meloxicam (MOBIC) 7.5 MG tablet Take 7.5 mg by mouth at bedtime as needed for pain.    . methocarbamol (ROBAXIN) 500 MG tablet Take 500 mg by mouth at bedtime as needed for muscle spasms.     . naproxen sodium (ANAPROX) 220 MG tablet Take 220 mg by  mouth daily as needed.     . NON FORMULARY 70 mg every 30 (thirty) days. 70mg  monthly injections for migraines    . OnabotulinumtoxinA (BOTOX IM) Inject into the muscle every 3 (three) months. FOR MIGRAINES    . OVER THE COUNTER MEDICATION     . OVER THE COUNTER MEDICATION Take 250 mg by mouth daily. ASTRAGALUS HERBAL. 1 CAPSULE DAILY    . Psyllium Fiber 0.52 g CAPS Take 4 capsules by mouth every morning. 625 mg per cap    . rizatriptan (MAXALT-MLT) 10 MG disintegrating tablet Take 10 mg by mouth as needed for migraine.     . thyroid (ARMOUR THYROID) 30 MG tablet Take 30 mg by mouth daily before breakfast.     . tiZANidine (ZANAFLEX) 4 MG tablet Take 1 tablet by mouth daily as needed (MIGRAINES).     . TOPROL XL 100 MG 24 hr tablet Take 100 mg by mouth daily.     Marland Kitchen UNABLE TO FIND Take 1 tablet by mouth 2 (two) times daily. Med Name: Bonney Leitz calcium supplement  800-1200mg  daily (EACH IS 400 MG)    . UNABLE TO FIND Take 1 capsule by mouth daily. BETAGLUCAN (HERBAL) 400MG  CAPSULE. 1 CAPSULE DAILY.    Cristino Martes Root 100 MG CAPS Take 1 capsule by mouth at bedtime as needed (SLEEP).     . zolpidem (AMBIEN) 10 MG tablet Take 10 mg by mouth at bedtime.   0  . Lidocaine 0.5 % AERO Place 1 spray into both nostrils See admin instructions. CAN ONLY TAKE ONCE WEEKLY FOR MIGRAINES     No current facility-administered medications for this visit.    Facility-Administered Medications Ordered in Other Visits  Medication Dose Route Frequency Provider Last Rate Last Dose  . ondansetron (ZOFRAN) 4 mg in sodium chloride 0.9 % 50 mL IVPB  4 mg Intravenous Q6H PRN Kristeen Miss, MD         OBJECTIVE: Morbidly obese white woman in no acute distress  Vitals:   01/19/18 1607  BP: (!) 154/82  Pulse: 73  Resp: 18  Temp: 97.7 F (36.5 C)  SpO2: 100%     Body mass index is 39.19 kg/m.   Wt Readings from Last 3 Encounters:  01/19/18 210 lb 12.8 oz (95.6 kg)  05/15/17 190 lb (86.2 kg)  04/21/17 191 lb  (86.6 kg)      ECOG FS:1 - Symptomatic but completely ambulatory  Ocular: Sclerae unicteric, pupils round and equal Lymphatic: No cervical or supraclavicular adenopathy Lungs no rales or rhonchi Heart regular rate and rhythm Abd soft, obese,  nontender, positive bowel sounds MSK no focal spinal tenderness, no joint edema Neuro: non-focal, well-oriented, appropriate affect Breasts: Status post bilateral nipple sparing mastectomies and implant reconstruction.  Also status post bilateral reduction mammoplasties.  The cosmetic result is good.  There is no dehiscence, erythema, or swelling.  Both axillae are benign.   LAB RESULTS:  CMP     Component Value Date/Time   NA 140 01/19/2018 1556   K 4.4 01/19/2018 1556   CL 106 01/19/2018 1556   CO2 25 01/19/2018 1556   GLUCOSE 134 (H) 01/19/2018 1556   BUN 16 01/19/2018 1556   CREATININE 0.73 01/19/2018 1556   CALCIUM 8.9 01/19/2018 1556   PROT 6.8 01/19/2018 1556   ALBUMIN 3.6 01/19/2018 1556   AST 15 01/19/2018 1556   ALT 15 01/19/2018 1556   ALKPHOS 53 01/19/2018 1556   BILITOT 0.3 01/19/2018 1556   GFRNONAA >60 01/19/2018 1556   GFRAA >60 01/19/2018 1556    No results found for: TOTALPROTELP, ALBUMINELP, A1GS, A2GS, BETS, BETA2SER, GAMS, MSPIKE, SPEI  No results found for: KPAFRELGTCHN, LAMBDASER, KAPLAMBRATIO  Lab Results  Component Value Date   WBC 7.1 01/19/2018   NEUTROABS 3.5 01/19/2018   HGB 12.2 01/19/2018   HCT 38.8 01/19/2018   MCV 89.2 01/19/2018   PLT 263 01/19/2018    @LASTCHEMISTRY @  No results found for: LABCA2  No components found for: LOVFIE332  No results for input(s): INR in the last 168 hours.  No results found for: LABCA2  No results found for: RJJ884  No results found for: ZYS063  No results found for: KZS010  No results found for: CA2729  No components found for: HGQUANT  No results found for: CEA1 / No results found for: CEA1   No results found for: AFPTUMOR  No results found  for: CHROMOGRNA  No results found for: PSA1  Appointment on 01/19/2018  Component Date Value Ref Range Status  . Sodium 01/19/2018 140  135 - 145 mmol/L Final  . Potassium 01/19/2018 4.4  3.5 - 5.1 mmol/L Final  . Chloride 01/19/2018 106  98 - 111 mmol/L Final  . CO2 01/19/2018 25  22 - 32 mmol/L Final  . Glucose, Bld 01/19/2018 134* 70 - 99 mg/dL Final  . BUN 01/19/2018 16  6 - 20 mg/dL Final  . Creatinine 01/19/2018 0.73  0.44 - 1.00 mg/dL Final  . Calcium 01/19/2018 8.9  8.9 - 10.3 mg/dL Final  . Total Protein 01/19/2018 6.8  6.5 - 8.1 g/dL Final  . Albumin 01/19/2018 3.6  3.5 - 5.0 g/dL Final  . AST 01/19/2018 15  15 - 41 U/L Final  . ALT 01/19/2018 15  0 - 44 U/L Final  . Alkaline Phosphatase 01/19/2018 53  38 - 126 U/L Final  . Total Bilirubin 01/19/2018 0.3  0.3 - 1.2 mg/dL Final  . GFR, Est Non Af Am 01/19/2018 >60  >60 mL/min Final  . GFR, Est AFR Am 01/19/2018 >60  >60 mL/min Final  . Anion gap 01/19/2018 9  5 - 15 Final   Performed at [facility], [place] [address] [place], [place] [address]  . WBC Count 01/19/2018 7.1  4.0 - 10.5 K/uL Final  . RBC 01/19/2018 4.35  3.87 - 5.11 MIL/uL Final  . Hemoglobin 01/19/2018 12.2  12.0 - 15.0 g/dL Final  . HCT 01/19/2018 38.8  36.0 - 46.0 % Final  . MCV 01/19/2018 89.2  80.0 - 100.0 fL Final  . MCH 01/19/2018 28.0  26.0 - 34.0 pg Final  . MCHC 01/19/2018 31.4  30.0 - 36.0 g/dL Final  . RDW 01/19/2018 13.5  11.5 - 15.5 % Final  . Platelet Count 01/19/2018 263  150 - 400 K/uL Final  . nRBC 01/19/2018 0.0  0.0 - 0.2 % Final  . Neutrophils Relative % 01/19/2018 50  % Final  . Neutro Abs 01/19/2018 3.5  1.7 - 7.7 K/uL Final  . Lymphocytes Relative 01/19/2018 32  % Final  . Lymphs Abs 01/19/2018 2.3  0.7 - 4.0 K/uL Final  . Monocytes Relative 01/19/2018 9  % Final  . Monocytes Absolute 01/19/2018 0.6  0.1 - 1.0 K/uL Final  . Eosinophils Relative 01/19/2018 8  % Final  . Eosinophils Absolute 01/19/2018 0.6*  0.0 - 0.5 K/uL Final  . Basophils Relative 01/19/2018 1  % Final  . Basophils Absolute 01/19/2018 0.1  0.0 - 0.1 K/uL Final  . Immature Granulocytes 01/19/2018 0  % Final  . Abs Immature Granulocytes 01/19/2018 0.03  0.00 - 0.07 K/uL Final   Performed at [facility], [place] [address], [place], [place] [address]    (this displays the last labs from the last 3 days)  No results found for: TOTALPROTELP, ALBUMINELP, A1GS, A2GS, BETS, BETA2SER, GAMS, MSPIKE, SPEI (this displays SPEP labs)  No results found for: KPAFRELGTCHN, LAMBDASER, KAPLAMBRATIO (kappa/lambda light chains)  No results found for: HGBA, HGBA2QUANT, HGBFQUANT, HGBSQUAN (Hemoglobinopathy evaluation)   No results found for: LDH  No results found for: IRON, TIBC, IRONPCTSAT (Iron and TIBC)  No results found for: FERRITIN  Urinalysis No results found for: COLORURINE, APPEARANCEUR, LABSPEC, PHURINE, GLUCOSEU, HGBUR, BILIRUBINUR, KETONESUR, PROTEINUR, UROBILINOGEN, NITRITE, LEUKOCYTESUR   STUDIES:  Outside studies reviewed with the patient  ELIGIBLE FOR AVAILABLE RESEARCH PROTOCOL: no   ASSESSMENT: 57 y.o. [place] woman status post left breast biopsy x2 on 10/15/2017, both sides showing ductal carcinoma in situ, grade 3, estrogen and progesterone receptor positive  (1) status post bilateral mastectomies 12/08/2017, showing  (a) on the right, no evidence of malignancy  (b) on the left, residual ductal carcinoma in situ, grade 3, [margins]; a total of 3 left axillary lymph nodes were removed  (2) status post bilateral reduction mammoplasty with implant reconstruction 12/25/2017, with benign pathology   PLAN: I spent approximately 50 minutes face to face with Tiegan with more than 50% of that time spent in counseling and coordination of care. Specifically we reviewed the biology of the patient's diagnosis and the specifics of her situation. Meili understands that in noninvasive ductal  carcinoma, also called ductal carcinoma in situ ("DCIS") the breast cancer cells remain trapped in the ducts were they started. They cannot travel to a vital organ. For that reason these cancers in themselves are not life-threatening.  If the whole breast is removed then all the ducts are removed and since the cancer cells are trapped in the ducts, the cure rate with mastectomy for noninvasive breast cancer approaches 100%.  While we usually recommend lumpectomy in this case, given her strong family history and her lifetime risk of developing another breast cancer, her decision for bilateral mastectomies was reasonable.  She understands antiestrogens or other systemic therapy have no role in her treatment, and that she is essentially cured.  We do not recommend MRIs after implant reconstruction.  Currently breast MRI is being recommended every 3 to 5 years in patients who have silicone implants.  That can easily be arranged for through either her plastic surgeon or her  gynecologist  It will be important for her to have a yearly physician breast exam.  She plans to accomplish that through Dr. Delanna Ahmadi office.    I did alert her to breast self-awareness and breast self-exam techniques and the importance of her letting her physicians know if she finds any change in either breast in the future.  Most likely this will be benign, but it will need to be evaluated further at that time  Otherwise I am comfortable releasing her back to Dr. Delanna Ahmadi care.  I will be glad to see her again at any point in the future if and when the need arises but at this point we are making no further routine appointments for her here.     Alphonsa Overall has a good understanding of the overall plan. She agrees with it. She knows the goal of treatment in her case is cure. She will call with any problems that may develop before her next visit here.   , Virgie Dad, MD  01/19/18 5:16 PM Medical Oncology and  Hematology [facility] [address] [place], [place] [address] Tel. [phone]    Fax. [phone]    I, Jacqualyn Posey am acting as a Education administrator for Chauncey Cruel, MD.   I, Lurline Del MD, have reviewed the above documentation for accuracy and completeness, and I agree with the above.

## 2018-01-16 ENCOUNTER — Other Ambulatory Visit: Payer: Self-pay

## 2018-01-16 DIAGNOSIS — M47812 Spondylosis without myelopathy or radiculopathy, cervical region: Secondary | ICD-10-CM

## 2018-01-19 ENCOUNTER — Encounter: Payer: Self-pay | Admitting: Oncology

## 2018-01-19 ENCOUNTER — Inpatient Hospital Stay: Payer: 59 | Attending: Oncology | Admitting: Oncology

## 2018-01-19 ENCOUNTER — Inpatient Hospital Stay: Payer: 59

## 2018-01-19 DIAGNOSIS — D0592 Unspecified type of carcinoma in situ of left breast: Secondary | ICD-10-CM | POA: Diagnosis present

## 2018-01-19 DIAGNOSIS — Z79899 Other long term (current) drug therapy: Secondary | ICD-10-CM | POA: Insufficient documentation

## 2018-01-19 DIAGNOSIS — Z9013 Acquired absence of bilateral breasts and nipples: Secondary | ICD-10-CM | POA: Diagnosis not present

## 2018-01-19 DIAGNOSIS — Z803 Family history of malignant neoplasm of breast: Secondary | ICD-10-CM | POA: Diagnosis not present

## 2018-01-19 DIAGNOSIS — Z170 Estrogen receptor positive status [ER+]: Secondary | ICD-10-CM | POA: Insufficient documentation

## 2018-01-19 DIAGNOSIS — D0512 Intraductal carcinoma in situ of left breast: Secondary | ICD-10-CM | POA: Diagnosis not present

## 2018-01-19 DIAGNOSIS — Z421 Encounter for breast reconstruction following mastectomy: Secondary | ICD-10-CM | POA: Insufficient documentation

## 2018-01-19 DIAGNOSIS — I10 Essential (primary) hypertension: Secondary | ICD-10-CM | POA: Diagnosis not present

## 2018-01-19 DIAGNOSIS — M47812 Spondylosis without myelopathy or radiculopathy, cervical region: Secondary | ICD-10-CM

## 2018-01-19 LAB — CMP (CANCER CENTER ONLY)
ALT: 15 U/L (ref 0–44)
ANION GAP: 9 (ref 5–15)
AST: 15 U/L (ref 15–41)
Albumin: 3.6 g/dL (ref 3.5–5.0)
Alkaline Phosphatase: 53 U/L (ref 38–126)
BUN: 16 mg/dL (ref 6–20)
CO2: 25 mmol/L (ref 22–32)
Calcium: 8.9 mg/dL (ref 8.9–10.3)
Chloride: 106 mmol/L (ref 98–111)
Creatinine: 0.73 mg/dL (ref 0.44–1.00)
GFR, Estimated: >60 mL/min (ref >60)
Glucose, Bld: 134 mg/dL — ABNORMAL HIGH (ref 70–99)
POTASSIUM: 4.4 mmol/L (ref 3.5–5.1)
SODIUM: 140 mmol/L (ref 135–145)
Total Bilirubin: 0.3 mg/dL (ref 0.3–1.2)
Total Protein: 6.8 g/dL (ref 6.5–8.1)

## 2018-01-19 LAB — CBC WITH DIFFERENTIAL (CANCER CENTER ONLY)
Abs Immature Granulocytes: 0.03 10*3/uL (ref 0.00–0.07)
BASOS ABS: 0.1 10*3/uL (ref 0.0–0.1)
Basophils Relative: 1 %
Eosinophils Absolute: 0.6 10*3/uL — ABNORMAL HIGH (ref 0.0–0.5)
Eosinophils Relative: 8 %
HEMATOCRIT: 38.8 % (ref 36.0–46.0)
Hemoglobin: 12.2 g/dL (ref 12.0–15.0)
Immature Granulocytes: 0 %
LYMPHS ABS: 2.3 10*3/uL (ref 0.7–4.0)
LYMPHS PCT: 32 %
MCH: 28 pg (ref 26.0–34.0)
MCHC: 31.4 g/dL (ref 30.0–36.0)
MCV: 89.2 fL (ref 80.0–100.0)
Monocytes Absolute: 0.6 10*3/uL (ref 0.1–1.0)
Monocytes Relative: 9 %
NEUTROS ABS: 3.5 10*3/uL (ref 1.7–7.7)
NRBC: 0 % (ref 0.0–0.2)
Neutrophils Relative %: 50 %
Platelet Count: 263 10*3/uL (ref 150–400)
RBC: 4.35 MIL/uL (ref 3.87–5.11)
RDW: 13.5 % (ref 11.5–15.5)
WBC Count: 7.1 10*3/uL (ref 4.0–10.5)

## 2018-01-20 ENCOUNTER — Telehealth: Payer: Self-pay | Admitting: Oncology

## 2018-01-20 NOTE — Telephone Encounter (Signed)
Per 12/30 no los °

## 2018-01-22 DIAGNOSIS — Z9013 Acquired absence of bilateral breasts and nipples: Secondary | ICD-10-CM | POA: Diagnosis not present

## 2018-01-22 DIAGNOSIS — M25612 Stiffness of left shoulder, not elsewhere classified: Secondary | ICD-10-CM | POA: Diagnosis not present

## 2018-01-22 DIAGNOSIS — D0512 Intraductal carcinoma in situ of left breast: Secondary | ICD-10-CM | POA: Diagnosis not present

## 2018-02-03 DIAGNOSIS — D0512 Intraductal carcinoma in situ of left breast: Secondary | ICD-10-CM | POA: Diagnosis not present

## 2018-02-03 DIAGNOSIS — Z9013 Acquired absence of bilateral breasts and nipples: Secondary | ICD-10-CM | POA: Diagnosis not present

## 2018-02-03 DIAGNOSIS — M25612 Stiffness of left shoulder, not elsewhere classified: Secondary | ICD-10-CM | POA: Diagnosis not present

## 2018-03-03 DIAGNOSIS — D0512 Intraductal carcinoma in situ of left breast: Secondary | ICD-10-CM | POA: Diagnosis not present

## 2018-03-03 DIAGNOSIS — M25612 Stiffness of left shoulder, not elsewhere classified: Secondary | ICD-10-CM | POA: Diagnosis not present

## 2018-03-03 DIAGNOSIS — Z9013 Acquired absence of bilateral breasts and nipples: Secondary | ICD-10-CM | POA: Diagnosis not present

## 2018-03-04 DIAGNOSIS — G43719 Chronic migraine without aura, intractable, without status migrainosus: Secondary | ICD-10-CM | POA: Diagnosis not present

## 2018-03-04 DIAGNOSIS — R51 Headache: Secondary | ICD-10-CM | POA: Diagnosis not present

## 2018-04-06 DIAGNOSIS — M25612 Stiffness of left shoulder, not elsewhere classified: Secondary | ICD-10-CM | POA: Diagnosis not present

## 2018-04-06 DIAGNOSIS — D0512 Intraductal carcinoma in situ of left breast: Secondary | ICD-10-CM | POA: Diagnosis not present

## 2018-04-06 DIAGNOSIS — Z9013 Acquired absence of bilateral breasts and nipples: Secondary | ICD-10-CM | POA: Diagnosis not present

## 2018-04-17 DIAGNOSIS — Z0000 Encounter for general adult medical examination without abnormal findings: Secondary | ICD-10-CM | POA: Diagnosis not present

## 2018-04-17 DIAGNOSIS — R7309 Other abnormal glucose: Secondary | ICD-10-CM | POA: Diagnosis not present

## 2018-04-17 DIAGNOSIS — I10 Essential (primary) hypertension: Secondary | ICD-10-CM | POA: Diagnosis not present

## 2018-04-17 DIAGNOSIS — E7849 Other hyperlipidemia: Secondary | ICD-10-CM | POA: Diagnosis not present

## 2018-04-17 DIAGNOSIS — E038 Other specified hypothyroidism: Secondary | ICD-10-CM | POA: Diagnosis not present

## 2018-04-20 ENCOUNTER — Encounter: Payer: Self-pay | Admitting: Neurology

## 2018-04-21 ENCOUNTER — Telehealth: Payer: Self-pay

## 2018-04-21 NOTE — Telephone Encounter (Signed)
Due to current COVID 19 pandemic, our office is severely reducing in office visits for at least the next 2 weeks, in order to minimize the risk to our patients and healthcare providers.   I called pt to discuss her appt. No answer, left a message asking her to call me back.

## 2018-04-22 NOTE — Telephone Encounter (Signed)
I called pt and had an extended conversation with her, greater than 15 minutes. Pt is agreeable to a virtual visit at her appt time tomorrow.  Pt understands that although there may be some limitations with this type of visit, we will take all precautions to reduce any security or privacy concerns.  Pt understands that this will be treated like an in office visit and we will file with pt's insurance, and there may be a patient responsible charge related to this service.  Pt's email is [email or web address]. Pt understands that the [employer] webex software must be downloaded and operational on the device pt plans to use for the visit.  Pt's meds, allergies ,and PMH were updated.  Pt reports that she has had trouble getting supplies from [place] and asked that I reach out to a manager on her behalf. I will email Jeneen Rinks at [employer].

## 2018-04-23 ENCOUNTER — Ambulatory Visit (INDEPENDENT_AMBULATORY_CARE_PROVIDER_SITE_OTHER): Payer: 59 | Admitting: Neurology

## 2018-04-23 ENCOUNTER — Encounter: Payer: Self-pay | Admitting: Neurology

## 2018-04-23 ENCOUNTER — Ambulatory Visit: Payer: 59 | Admitting: Adult Health

## 2018-04-23 ENCOUNTER — Other Ambulatory Visit: Payer: Self-pay

## 2018-04-23 DIAGNOSIS — Z9989 Dependence on other enabling machines and devices: Secondary | ICD-10-CM

## 2018-04-23 DIAGNOSIS — G4733 Obstructive sleep apnea (adult) (pediatric): Secondary | ICD-10-CM

## 2018-04-23 NOTE — Patient Instructions (Signed)
Given verbally, during today's virtual video-based encounter, with verbal feedback received.   

## 2018-04-23 NOTE — Progress Notes (Signed)
Interim history:   Ms. Wareing is a 58 year old right-handed woman with an underlying medical history of hypertension, hypothyroidism, migraine headaches, allergic rhinitis, plantar fasciitis, cervical disc disease with myelopathy, status post neck surgery on 03/19/2013 under Dr. Ellene Route, hyperlipidemia, obesity, and recent Dx of DCIS, s/p b/l mastectomies, with whom I am conducting a virtual, video based follow-up appointment via Webex for FU consultation of her obstructive sleep apnea, established on CPAP therapy. The patient is unaccompanied today and joins from home. I last saw her on 04/21/2017, at which time she was compliant with her CPAP. She reported doing well. She was supposed to have a cardiac CT. She was in the interim diagnosed with high-grade DCIS on the left side and underwent bilateral mastectomies in November 2019.  Today, 04/23/2018: Please also see below for documentation of the virtual visit.  I reviewed her CPAP compliance data from 03/22/2018 through 04/20/2018 which is a total of 30 days, during which time she used her machine every night with percent used days greater than 4 hours at 100%, indicating superb compliance with an average usage of 9 hours and 41 minutes which is rather high, residual AHI at goal at 0.4 per hour, leak on the low side with the 95th percentile at 5.4 L/m on a pressure of 9 cm with EPR of 3.  The patient's allergies, current medications, family history, past medical history, past social history, past surgical history and problem list were reviewed and updated as appropriate.    Previously (copied from previous notes for reference):   I first met her on 06/03/2013 at the request of her primary care physician, at which time she reported a prior diagnosis of OSA and she was using CPAP but her machine was defective and she needed reevaluation as she had not had a sleep study in over 8 years. She was compliant with her CPAP at the time. She was invited for  sleep study testing. She had a baseline sleep study on 06/18/2016, which showed a sleep efficiency of 77.3%, sleep latency of 37 minutes. She had a decreased percentage of REM sleep at an increased percentage of stage II sleep. Overall AHI was 0.2 per hour, REM AHI was 3.3 per hour, supine AHI was 0 per hour. Average oxygen saturation was 98%, nadir was 93%. She had no significant PLMS. She was offered a home sleep test, which she had on 07/10/2016. AHI was 6.7 per hour, average oxygen saturation 94%, nadir of 78%. Based on the home sleep test results she was placed on CPAP therapy at home. She had a compliance follow-up appointment with Ward Givens, nurse practitioner on 10/23/2016 at which time she was doing well and compliant with CPAP therapy.   I reviewed her CPAP compliance data from 03/18/2017 through 04/16/2017 which is a total of 30 days, during which time she used her CPAP every night with percent used days greater than 4 hours at 100%, indicating superb compliance with an average usage of 8 hours and 35 minutes, residual AHI at goal at 0.2 per hour, leak acceptable with the 95th percentile at 10 L/m on a pressure of 9 cm with EPR of 3.    06/03/2016: (She) was previously diagnosed with obstructive sleep apnea and placed on CPAP therapy. She has a CPAP machine which she has been using, but some parts are defective, including the humidier and the power button and therefore, the machine does not always work. She needs an updated machine. Sleep study testing was in or  around 2010. Prior sleep study results are not available for my review today. CPAP compliance download was reviewed from 01/24/2016 through 04/22/2016, which is a total of 90 days, during which time she used her CPAP every night with percent used days greater than 4 hours at 100%, average usage of 9 hours and 5 minutes, residual AHI 1 per hour, pressure at 9 cm with EPR. I reviewed your office note from 05/01/2016, which you kindly  included. Her DME company is [facility]. Her Epworth sleepiness score is 9 out of 24 today, fatigue score is 51 out of 63. Her bedtime is around MN to 1 AM and she has been taking ambien nearly nightly. She reports recent increase in stress in the past few months. She has had weight gain as well. Her wakeup time is usually between 9:30 and 10. She has nocturia once per average night. She has occasional morning headaches. For her migraines she [facility] and gets Botox injections. She reports a possible family history of obstructive sleep apnea in her father. She denies restless leg symptoms. She is a nonsmoker and drinks alcohol very occasionally, once every week or every 2 weeks. She does not drink caffeine daily. She does not work. She home schooled her 3 children. She lives at home with her husband and currently her middle daughter is back home. She has an older son and a younger son as well. She does not watch TV in her bedroom. They have a dog, not in their bedroom at night.   Her Past Medical History Is Significant For: Past Medical History:  Diagnosis Date  . Back pain of lumbar region with sciatica   . Cancer [facility])    breast  . Family history of anesthesia complication    grandfather died under anesthesia 79's- had heart issues  . H/O carpal tunnel repair 14  . Headache(784.0)   . Hypertension   . Hypothyroidism   . PONV (postoperative nausea and vomiting)   . Sleep apnea    cpap since 10    Her Past Surgical History Is Significant For: Past Surgical History:  Procedure Laterality Date  . BILATERAL TOTAL MASTECTOMY WITH AXILLARY LYMPH NODE DISSECTION    . BREAST RECONSTRUCTION    . CERVICAL DISC ARTHROPLASTY N/A 03/19/2013   Procedure: CERVICAL FIVE TO SIX, CERVICAL SIX TO SEVEN CERVICAL ANTERIOR DISC ARTHROPLASTY;  Surgeon: Kristeen Miss, MD;  Location: [facility];  Service: Neurosurgery;  Laterality: N/A;  C56 C67 artificial disc replacement  .  CESAREAN SECTION    . COLONOSCOPY    . DILATATION & CURETTAGE/HYSTEROSCOPY WITH TRUECLEAR N/A 11/25/2013   Procedure: DILATATION & CURETTAGE/HYSTEROSCOPY WITH TRUCLEAR;  Surgeon: Margarette Asal, MD;  Location: [facility];  Service: Gynecology;  Laterality: N/A;  . ECTOPIC PREGNANCY SURGERY    . GALLBLADDER SURGERY    . MOUTH SURGERY     child- dog bite  . TONSILLECTOMY    . TUBAL LIGATION      Her Family History Is Significant For: Family History  Problem Relation Age of Onset  . Hyperlipidemia Mother   . Heart disease Father   . Hypertension Father   . Diabetes Father   . Cancer Maternal Grandmother        breast cancer  . Diabetes Paternal Grandmother   . Heart disease Paternal Grandfather   . Hypertension Paternal Grandfather   . Cancer Maternal Aunt        Breast - In 13's  .  Cancer Paternal Aunt        Breast - in 37's    Her Social History Is Significant For: Social History   Socioeconomic History  . Marital status: Married    Spouse name: Clair Gulling  . Number of children: 3  . Years of education: college  . Highest education level: Not on file  Occupational History  . Occupation: N/A  Social Needs  . Financial resource strain: Not on file  . Food insecurity:    Worry: Not on file    Inability: Not on file  . Transportation needs:    Medical: Not on file    Non-medical: Not on file  Tobacco Use  . Smoking status: Never Smoker  . Smokeless tobacco: Never Used  . Tobacco comment: occ alcohol  Substance and Sexual Activity  . Alcohol use: Yes    Comment: socially  . Drug use: No  . Sexual activity: Not on file  Lifestyle  . Physical activity:    Days per week: Not on file    Minutes per session: Not on file  . Stress: Not on file  Relationships  . Social connections:    Talks on phone: Not on file    Gets together: Not on file    Attends religious service: Not on file    Active member of club or organization: Not on file    Attends meetings of clubs or  organizations: Not on file    Relationship status: Not on file  Other Topics Concern  . Not on file  Social History Narrative   Denies caffeine use     Her Allergies Are:  Allergies  Allergen Reactions  . Effexor [Venlafaxine Hcl]     Weight loss and cant sleep  . Flexeril [Cyclobenzaprine] Hives  :   Her Current Medications Are:  Outpatient Encounter Medications as of 04/23/2018  Medication Sig  . baclofen (LIORESAL) 20 MG tablet Take 20 mg by mouth daily as needed (MIGRAINES).   . CHELATED MAGNESIUM PO Take 300 mg by mouth daily. 2 TAB (150 MG EACH) DAILY  . Erenumab-aooe (AIMOVIG) 70 MG/ML SOAJ Inject into the skin.  Marland Kitchen HYDROcodone-ibuprofen (VICOPROFEN) 7.5-200 MG per tablet Take 1 tablet by mouth every 8 (eight) hours as needed for moderate pain.  Marland Kitchen ibuprofen (ADVIL) 200 MG tablet Take 1 tablet (200 mg total) by mouth every 6 (six) hours as needed. (Patient taking differently: Take 200 mg by mouth every 6 (six) hours as needed for mild pain or moderate pain. )  . Lidocaine 0.5 % AERO Place 1 spray into both nostrils See admin instructions. CAN ONLY TAKE ONCE WEEKLY FOR MIGRAINES  . MELATONIN PO Take 3-5 mg by mouth at bedtime as needed (sleep).  . meloxicam (MOBIC) 7.5 MG tablet Take 7.5 mg by mouth at bedtime as needed for pain.  . methocarbamol (ROBAXIN) 500 MG tablet Take 500 mg by mouth at bedtime as needed for muscle spasms.   . naproxen sodium (ANAPROX) 220 MG tablet Take 220 mg by mouth daily as needed.   . NON FORMULARY 70 mg every 30 (thirty) days. 1m monthly injections for migraines  . OnabotulinumtoxinA (BOTOX IM) Inject into the muscle every 3 (three) months. FOR MIGRAINES  . OVER THE COUNTER MEDICATION   . OVER THE COUNTER MEDICATION Take 250 mg by mouth daily. ASTRAGALUS HERBAL. 1 CAPSULE DAILY  . Psyllium Fiber 0.52 g CAPS Take 4 capsules by mouth every morning. 625 mg per cap  . rizatriptan (MAXALT-MLT)  10 MG disintegrating tablet Take 10 mg by mouth as needed  for migraine.   . thyroid (ARMOUR THYROID) 30 MG tablet Take 30 mg by mouth daily before breakfast.   . tiZANidine (ZANAFLEX) 4 MG tablet Take 1 tablet by mouth daily as needed (MIGRAINES).   . TOPROL XL 100 MG 24 hr tablet Take 100 mg by mouth daily.   Marland Kitchen UNABLE TO FIND Take 1 tablet by mouth 2 (two) times daily. Med Name: Bonney Leitz calcium supplement  800-127m daily (EACH IS 400 MG)  . UNABLE TO FIND Take 1 capsule by mouth daily. BETAGLUCAN (HERBAL) 400MG CAPSULE. 1 CAPSULE DAILY.  .Cristino MartesRoot 100 MG CAPS Take 1 capsule by mouth at bedtime as needed (SLEEP).   . zolpidem (AMBIEN) 10 MG tablet Take 10 mg by mouth at bedtime.    Facility-Administered Encounter Medications as of 04/23/2018  Medication  . ondansetron (ZOFRAN) 4 mg in sodium chloride 0.9 % 50 mL IVPB  :  Review of Systems:  Out of a complete 14 point review of systems, all are reviewed and negative with the exception of these symptoms as listed below:  Virtual Visit via Video Note on 04/23/2018:   I connected with Ms. Keelin on 04/23/18 at  2:00 PM EDT by a video enabled telemedicine application and verified that I am speaking with the correct person using two identifiers.   I discussed the limitations of evaluation and management by telemedicine and the availability of in person appointments. The patient expressed understanding and agreed to proceed.  History of Present Illness:  She is compliant with her CPAP, she reports that she has had trouble getting her supplies from the DME company. I apologized for it. she uses nasal pillows. She changes the filter about every 2 or 3 months. She is advised to change the filter monthly. She has had some issues with lymphedema since her breast cancer and lymph node surgeries. She is waiting for a arm sleeve for compression for the left arm. She tries to achieve 7-8 hours of sleep but sleep is interrupted, she is able to fall asleep okay, but wakes up after 4-6 hours and has an  extended period of time before she can fall asleep again.  Observations/Objective:  Her most recent available vitals are from 01/19/2018, blood pressure 154/82 with pulse of 73, temperature 97.7, BMI of 39.19.  She is in no acute distress, conversant, speech is clear, face is symmetric, extraocular movements grossly intact.She is able to move her upper extremities freely and without problems, coordination grossly intact.  Assessment and Plan:  In summary,Tyara D Herringis a very pleasant 89year oldfemalewith an underlying medical history of hypertension, hypothyroidism, migraine headaches, allergic rhinitis, plantar fasciitis, cervical disc disease with myelopathy, status post neck surgery in 2015 (Dr. EEllene Route, hyperlipidemia, obesity and recent diagnosis of DCIS with status post bilateral mastectomies in November 2019, who presents for virtual, video based follow-up consultation of her of her obstructive sleep apnea, via Webex. She has ongoing excellent compliance with her CPAP at a pressure of 9 cm. Her apnea score is at goal. She is commended for treatment adherence but encouraged to limit her time in bed to about 8 hours if possible. Her current average usage is 9 hours and 41 min and her total sleep time averages 7-8 hours with interruptions she reports. She needs supplies and I placed an order for it. We will try to expedite as she has had trouble getting supplies as I understand. I apologized for  it. She is stable enough for ongoing yearly checkup from my end of things. She is encouraged to follow-up in one year with one of our nurse practitioners. I answered all her questions today and she was in agreement.  Follow Up Instructions:  1. Continue CPAP with full compliance, try to achieve 7 to 8 hours of sleep.  2. Stay up-to-date with CPAP related supplies, will place an order for supplies.  3. FU in one year for OSA check up with NP.  I discussed the assessment and treatment plan with  the patient. The patient was provided an opportunity to ask questions and all were answered. The patient agreed with the plan and demonstrated an understanding of the instructions.   The patient was advised to call back or seek an in-person evaluation if the symptoms worsen or if the condition fails to improve as anticipated.  I provided 20 minutes of non-face-to-face time during this encounter.   Star Age, MD

## 2018-04-24 ENCOUNTER — Telehealth: Payer: Self-pay | Admitting: Neurology

## 2018-04-24 NOTE — Telephone Encounter (Signed)
Pt states that RN called her this morning but does not know why. Please advise.

## 2018-04-27 NOTE — Telephone Encounter (Signed)
Our office was closed on Friday. I did not call her on Friday.

## 2018-04-27 NOTE — Telephone Encounter (Signed)
I called pt, left a VM per DPR, advising her that we have no record of anyone from our office calling her on 04/24/2018. If pt has questions, she may call us back.

## 2018-05-01 DIAGNOSIS — G4733 Obstructive sleep apnea (adult) (pediatric): Secondary | ICD-10-CM | POA: Diagnosis not present

## 2018-05-26 DIAGNOSIS — M5136 Other intervertebral disc degeneration, lumbar region: Secondary | ICD-10-CM | POA: Diagnosis not present

## 2018-06-03 DIAGNOSIS — G43719 Chronic migraine without aura, intractable, without status migrainosus: Secondary | ICD-10-CM | POA: Diagnosis not present

## 2018-10-26 ENCOUNTER — Encounter: Payer: Self-pay | Admitting: Psychiatry

## 2018-10-26 ENCOUNTER — Ambulatory Visit (INDEPENDENT_AMBULATORY_CARE_PROVIDER_SITE_OTHER): Payer: 59 | Admitting: Psychiatry

## 2018-10-26 ENCOUNTER — Other Ambulatory Visit: Payer: Self-pay

## 2018-10-26 DIAGNOSIS — G4700 Insomnia, unspecified: Secondary | ICD-10-CM | POA: Diagnosis not present

## 2018-10-26 DIAGNOSIS — F411 Generalized anxiety disorder: Secondary | ICD-10-CM

## 2018-10-26 DIAGNOSIS — F329 Major depressive disorder, single episode, unspecified: Secondary | ICD-10-CM | POA: Diagnosis not present

## 2018-10-26 DIAGNOSIS — F32A Depression, unspecified: Secondary | ICD-10-CM

## 2018-10-26 MED ORDER — VIIBRYD STARTER PACK 10 & 20 MG PO KIT: PACK | ORAL | 0 refills | Status: DC

## 2018-10-26 NOTE — Progress Notes (Signed)
[name] [id number] [birth year] 58 y.o.  Subjective:   Patient ID:  [name] is a 58 y.o. (DOB 1960/09/02) female.  Chief Complaint:  Chief Complaint  Patient presents with  . Anxiety  . Depression  . Insomnia    HPI [name] presents to the office today for follow-up of anxiety, depression, and insomnia. She is accompanied by her husband. Was dxd with breast CA the day after last visit and reports that she had double mastectomy and extensive treatment for Breast CA. Husband reports that pt is having increased depression and anxiety with anniversaries of cancer dx, fight with father, and father's death. Husband reports that he sent text to pt's mother and sister not to make contact on 10/08/18.  Severe anxiety. She reports severe depression. She reports that she gets SOB when talking about certain stressors or conflict. Difficulty sleeping, both with sleep initiation and maintenance. She reports that she is having very vivid dreams and nightmares, often about her father. Husband reports that she has been stress eating. Difficulty with concentration and making decisions. She reports that her energy and motivation have been low. Limited enjoyment in things. She reports that she previously enjoyed knitting and now is not able to due to poor concentration and lymphedema. Describes excessive feelings of guilt. She reports lifelong chronic suicidal thoughts. Reports thoughts that others would be better without her. Denies suicidal intent or plan. She contracts for safety.   Father died one year ago and they were estranged. Mother-in-law died in April 21, 2022 and they were very close.  She reports that her mother is a Printmaker. Mother's best friend died a few months ago. Has been having multiple home repairs.   One daughter lives with them.   Has continued to see [name], LPC.   Past Psychiatric Medication Trials: ambien CR Ambien Xanax Effexor XR-Multiple adverse effects.  Severe discontinuation s/s.  Zonisamide- SI.  Lexapro-Concentration difficulty Elavil- Apatheric, difficulty with work Topamax- Anger, irritability  Review of Systems:  Review of Systems  Musculoskeletal: Negative for gait problem.       Knee pain due to torn meniscus.   Neurological: Negative for tremors.  Psychiatric/Behavioral:       Please refer to HPI   Lymphedema.  Medications: I have reviewed the patient's current medications.  Current Outpatient Medications  Medication Sig Dispense Refill  . Aspergillus Fumigatus 1:20 SOLN Inject into the skin.    . baclofen (LIORESAL) 20 MG tablet Take 20 mg by mouth daily as needed (MIGRAINES).     . CHELATED MAGNESIUM PO Take 300 mg by mouth daily. 2 TAB (150 MG EACH) DAILY    . Erenumab-aooe (AIMOVIG) 70 MG/ML SOAJ Inject into the skin.    Marland Kitchen ibuprofen (ADVIL) 200 MG tablet Take 1 tablet (200 mg total) by mouth every 6 (six) hours as needed. (Patient taking differently: Take 200 mg by mouth every 6 (six) hours as needed for mild pain or moderate pain. ) 30 tablet 0  . MELATONIN PO Take 3-5 mg by mouth at bedtime as needed (sleep).    . meloxicam (MOBIC) 7.5 MG tablet Take 7.5 mg by mouth at bedtime as needed for pain.    . methocarbamol (ROBAXIN) 500 MG tablet Take 500 mg by mouth at bedtime as needed for muscle spasms.     . naproxen sodium (ANAPROX) 220 MG tablet Take 220 mg by mouth daily as needed.     . rizatriptan (MAXALT-MLT) 10 MG disintegrating tablet Take 10 mg  by mouth as needed for migraine.     . thyroid (ARMOUR THYROID) 30 MG tablet Take 30 mg by mouth daily before breakfast.     . tiZANidine (ZANAFLEX) 4 MG tablet Take 1 tablet by mouth daily as needed (MIGRAINES).     . TOPROL XL 100 MG 24 hr tablet Take 100 mg by mouth daily.     Marland Kitchen UNABLE TO FIND Take 1 tablet by mouth 2 (two) times daily. Med Name: Bonney Leitz calcium supplement  800-1257m daily (EACH IS 400 MG)    . UNABLE TO FIND Take 1 capsule by mouth daily.  BETAGLUCAN (HERBAL) 400MG CAPSULE. 1 CAPSULE DAILY.    .Marland Kitchenzolpidem (AMBIEN) 10 MG tablet Take 10 mg by mouth at bedtime. Takes 5-10 mg po QHS  0  . HYDROcodone-ibuprofen (VICOPROFEN) 7.5-200 MG per tablet Take 1 tablet by mouth every 8 (eight) hours as needed for moderate pain. (Patient not taking: Reported on 10/26/2018) 30 tablet 0  . Lidocaine 0.5 % AERO Place 1 spray into both nostrils See admin instructions. CAN ONLY TAKE ONCE WEEKLY FOR MIGRAINES    . NON FORMULARY 70 mg every 30 (thirty) days. 76mmonthly injections for migraines    . OnabotulinumtoxinA (BOTOX IM) Inject into the muscle every 3 (three) months. FOR MIGRAINES    . OVER THE COUNTER MEDICATION     . OVER THE COUNTER MEDICATION Take 250 mg by mouth daily. ASTRAGALUS HERBAL. 1 CAPSULE DAILY    . Psyllium Fiber 0.52 g CAPS Take 4 capsules by mouth every morning. 625 mg per cap    . Valerian Root 100 MG CAPS Take 1 capsule by mouth at bedtime as needed (SLEEP).     . Vilazodone HCl (VIIBRYD STARTER PACK) 10 & 20 MG KIT Take 5 mg by mouth daily for 7 days, THEN 10 mg daily. 2 kit 0   No current facility-administered medications for this visit.    Facility-Administered Medications Ordered in Other Visits  Medication Dose Route Frequency Provider Last Rate Last Dose  . ondansetron (ZOFRAN) 4 mg in sodium chloride 0.9 % 50 mL IVPB  4 mg Intravenous Q6H PRN ElKristeen MissMD        Medication Side Effects: Other: N/A  Allergies:  Allergies  Allergen Reactions  . Effexor [Venlafaxine Hcl]     Weight loss and cant sleep  . Flexeril [Cyclobenzaprine] Hives    Past Medical History:  Diagnosis Date  . Back pain of lumbar region with sciatica   [facility]  . Family history of anesthesia complication    grandfather died under anesthesia 7069'shad heart issues  . H/O carpal tunnel repair 14  . Headache(784.0)   . Hypertension   . Hypothyroidism   . PONV (postoperative nausea and vomiting)   . Sleep apnea     cpap since 10    Family History  Problem Relation Age of Onset  . Hyperlipidemia Mother   . Heart disease Father   . Hypertension Father   . Diabetes Father   . Cancer Maternal Grandmother        breast cancer  . Diabetes Paternal Grandmother   . Heart disease Paternal Grandfather   . Hypertension Paternal Grandfather   . Cancer Maternal Aunt        Breast - In 4062's. Cancer Paternal Aunt        Breast - in 4036's. Suicidality Other     Social History  Socioeconomic History  . Marital status: Married    Spouse name: Clair Gulling  . Number of children: 3  . Years of education: college  . Highest education level: Not on file  Occupational History  . Occupation: N/A  Social Needs  . Financial resource strain: Not on file  . Food insecurity    Worry: Not on file    Inability: Not on file  . Transportation needs    Medical: Not on file    Non-medical: Not on file  Tobacco Use  . Smoking status: Never Smoker  . Smokeless tobacco: Never Used  . Tobacco comment: occ alcohol  Substance and Sexual Activity  . Alcohol use: Yes    Comment: socially  . Drug use: No  . Sexual activity: Not on file  Lifestyle  . Physical activity    Days per week: Not on file    Minutes per session: Not on file  . Stress: Not on file  Relationships  . Social Herbalist on phone: Not on file    Gets together: Not on file    Attends religious service: Not on file    Active member of club or organization: Not on file    Attends meetings of clubs or organizations: Not on file    Relationship status: Not on file  . Intimate partner violence    Fear of current or ex partner: Not on file    Emotionally abused: Not on file    Physically abused: Not on file    Forced sexual activity: Not on file  Other Topics Concern  . Not on file  Social History Narrative   Denies caffeine use     Past Medical History, Surgical history, Social history, and Family history were reviewed and updated  as appropriate.   Please see review of systems for further details on the patient's review from today.   Objective:   Physical Exam:  There were no vitals taken for this visit.  Physical Exam Constitutional:      General: She is not in acute distress.    Appearance: She is well-developed.  Musculoskeletal:        General: No deformity.  Neurological:     Mental Status: She is alert and oriented to person, place, and time.     Coordination: Coordination normal.  Psychiatric:        Attention and Perception: Attention and perception normal. She does not perceive auditory or visual hallucinations.        Mood and Affect: Mood is anxious and depressed. Affect is not labile, blunt, angry or inappropriate.        Speech: Speech normal.        Behavior: Behavior normal.        Thought Content: Thought content normal. Thought content is not paranoid or delusional. Thought content does not include homicidal or suicidal ideation. Thought content does not include homicidal or suicidal plan.        Cognition and Memory: Cognition and memory normal.        Judgment: Judgment normal.     Comments: Insight intact. No delusions.      Lab Review:     Component Value Date/Time   NA 140 01/19/2018 1556   K 4.4 01/19/2018 1556   CL 106 01/19/2018 1556   CO2 25 01/19/2018 1556   GLUCOSE 134 (H) 01/19/2018 1556   BUN 16 01/19/2018 1556   CREATININE 0.73 01/19/2018 1556   CALCIUM 8.9  01/19/2018 1556   PROT 6.8 01/19/2018 1556   ALBUMIN 3.6 01/19/2018 1556   AST 15 01/19/2018 1556   ALT 15 01/19/2018 1556   ALKPHOS 53 01/19/2018 1556   BILITOT 0.3 01/19/2018 1556   GFRNONAA >60 01/19/2018 1556   GFRAA >60 01/19/2018 1556       Component Value Date/Time   WBC 7.1 01/19/2018 1556   WBC 6.2 11/22/2013 1020   RBC 4.35 01/19/2018 1556   HGB 12.2 01/19/2018 1556   HCT 38.8 01/19/2018 1556   PLT 263 01/19/2018 1556   MCV 89.2 01/19/2018 1556   MCH 28.0 01/19/2018 1556   MCHC 31.4  01/19/2018 1556   RDW 13.5 01/19/2018 1556   LYMPHSABS 2.3 01/19/2018 1556   MONOABS 0.6 01/19/2018 1556   EOSABS 0.6 (H) 01/19/2018 1556   BASOSABS 0.1 01/19/2018 1556    No results found for: POCLITH, LITHIUM   No results found for: PHENYTOIN, PHENOBARB, VALPROATE, CBMZ   .res Assessment: Plan:   Patient seen for 45 minutes and greater than 50% of visit spent counseling patient and her husband and coordination of care to include reviewing significant changes in medical and social history since last visit over 1 year ago.  Discussed mood and anxiety signs and symptoms and possible treatment options to include considering obtaining pharmacogenetics testing.  Discussed other option would be to start [place] as planned 1 year ago.  Reviewed potential benefits, risks, and side effects for Viibryd and patient agrees to trial of Viibryd.  Discussed starting Viibryd at low dose and slowly increasing due to patient's history of adverse effects. Recommend continuing psychotherapy with [name], LPC. Patient to follow-up with this provider in 3 weeks or sooner if clinically indicated. Patient advised to contact office with any questions, adverse effects, or acute worsening in signs and symptoms.  Cady was seen today for anxiety, depression and insomnia.  Diagnoses and all orders for this visit:  Generalized anxiety disorder -     Vilazodone HCl (VIIBRYD STARTER PACK) 10 & 20 MG KIT; Take 5 mg by mouth daily for 7 days, THEN 10 mg daily.  Depression, unspecified depression type -     Vilazodone HCl (VIIBRYD STARTER PACK) 10 & 20 MG KIT; Take 5 mg by mouth daily for 7 days, THEN 10 mg daily.  Insomnia, unspecified type     Please see After Visit Summary for patient specific instructions.  Future Appointments  Date Time Provider [place]  11/19/2018  3:15 PM Thayer Headings, PMHNP [facility] None    No orders of the defined types were placed in this  encounter.   -------------------------------

## 2018-11-19 ENCOUNTER — Encounter: Payer: Self-pay | Admitting: Psychiatry

## 2018-11-19 ENCOUNTER — Ambulatory Visit (INDEPENDENT_AMBULATORY_CARE_PROVIDER_SITE_OTHER): Payer: 59 | Admitting: Psychiatry

## 2018-11-19 ENCOUNTER — Other Ambulatory Visit: Payer: Self-pay

## 2018-11-19 DIAGNOSIS — F32A Depression, unspecified: Secondary | ICD-10-CM

## 2018-11-19 DIAGNOSIS — F411 Generalized anxiety disorder: Secondary | ICD-10-CM

## 2018-11-19 DIAGNOSIS — F329 Major depressive disorder, single episode, unspecified: Secondary | ICD-10-CM | POA: Diagnosis not present

## 2018-11-19 DIAGNOSIS — G4700 Insomnia, unspecified: Secondary | ICD-10-CM

## 2018-11-19 NOTE — Progress Notes (Signed)
[name] [id number] [birth year] 58 y.o.  Virtual Visit via Video Note  I connected with pt @ on 11/19/18 at  3:15 PM EDT by a video enabled telemedicine application and verified that I am speaking with the correct person using two identifiers.   I discussed the limitations of evaluation and management by telemedicine and the availability of in person appointments. The patient expressed understanding and agreed to proceed.  I discussed the assessment and treatment plan with the patient. The patient was provided an opportunity to ask questions and all were answered. The patient agreed with the plan and demonstrated an understanding of the instructions.   The patient was advised to call back or seek an in-person evaluation if the symptoms worsen or if the condition fails to improve as anticipated.  I provided 35 minutes of non-face-to-face time during this encounter.  The patient was located at home.  The provider was located at [place].   Thayer Headings, PMHNP   Subjective:   Patient ID:  [name] is a 58 y.o. (DOB 08-02-1960) female.  Chief Complaint:  Chief Complaint  Patient presents with  . Anxiety  . Depression  . Insomnia    HPI [name] presents for follow-up of mood and anxiety. Started Viibryd 10/27/18 and increased 11/06/18. Started HCTZ on 10/29/18. She reports that she is not as anxious and is not ruminating as much. "I'm not getting triggered as much." She reports that she and her husband are not arguing as often. She reports that she has  had more migraines to include 7 since October 10th, which is a significant increase from the previous months of 3-4 migraines over the entire month. She had a severe migraine today and requested telehealth instead of in-office visit. Migraines have lasted 2 days straight. Has also had frequent HA's.  Had 6 Migraines in April and reports that this was a high stress time. Had 6 migraines in June, 3 in  July, 4 in August. Aimovig was increased in June. Due for next botox injections on 12/02/18 and reports that she typically has more migraines the 2 week period before injections.   She reports that she may sleep 4-6 hours of sleep with Ambien 5 mg. She reports middle of the night awakenings with taking a significant amount of time to fall asleep. She reports that nightmares and vivid dreams have decreased. Has been able to go to bed earlier with Viibryd. Has had some increases in anxiety at night over the last week. She reports that she is no longer having panic s/s and SOB.   She reports that she left the house at the encouragement of her husband and this was difficult. She reports that she is not as tearful. She reports that she continues not to cook and this is unusual for her since she enjoys cooking. She reports that her energy and motivation is low. She read some last night for the first time in awhile. She has started some knitting. Reports that she is having some increased difficulty remembering words or recalling the name of things. She reports that she has been hungrier and is craving sweets, which is typical when she is having migraines. Denies SI. Occasionally thinking about why she did not die when she had medical issues.   Past Psychiatric Medication Trials: ambien CR Ambien Xanax Effexor XR-Multiple adverse effects. Severe discontinuation s/s.  Zonisamide- SI.  Lexapro-Concentration difficulty Elavil- Apatheric, difficulty with work Topamax- Anger, irritability  Review of Systems:  Review of Systems  Musculoskeletal: Positive for arthralgias. Negative for gait problem.       Knee pain  Neurological: Negative for tremors.       She reports increase in migraines.  Hematological:       Lymphedema  Psychiatric/Behavioral:       Please refer to HPI    Medications: I have reviewed the patient's current medications.  Current Outpatient Medications  Medication Sig Dispense  Refill  . Aspergillus Fumigatus 1:20 SOLN Inject into the skin.    . baclofen (LIORESAL) 20 MG tablet Take 20 mg by mouth daily as needed (MIGRAINES).     . CHELATED MAGNESIUM PO Take 300 mg by mouth daily. 2 TAB (150 MG EACH) DAILY    . diclofenac sodium (VOLTAREN) 1 % GEL Apply topically 4 (four) times daily.    Eduard Roux (AIMOVIG) 140 MG/ML SOAJ Inject into the skin.     Marland Kitchen glucosamine-chondroitin 500-400 MG tablet Take 1 tablet by mouth 3 (three) times daily.    . hydrochlorothiazide (MICROZIDE) 12.5 MG capsule Take 12.5 mg by mouth daily.    Marland Kitchen MELATONIN PO Take 3-5 mg by mouth at bedtime as needed (sleep).    . meloxicam (MOBIC) 7.5 MG tablet Take 7.5 mg by mouth at bedtime as needed for pain.    . methocarbamol (ROBAXIN) 500 MG tablet Take 500 mg by mouth at bedtime as needed for muscle spasms.     . naproxen sodium (ANAPROX) 220 MG tablet Take 220 mg by mouth daily as needed.     . NON FORMULARY 70 mg every 30 (thirty) days. 77m monthly injections for migraines    . OnabotulinumtoxinA (BOTOX IM) Inject into the muscle every 3 (three) months. FOR MIGRAINES    . OVER THE COUNTER MEDICATION     . OVER THE COUNTER MEDICATION Take 250 mg by mouth daily. ASTRAGALUS HERBAL. 1 CAPSULE DAILY    . Psyllium Fiber 0.52 g CAPS Take 4 capsules by mouth every morning. 625 mg per cap    . rizatriptan (MAXALT-MLT) 10 MG disintegrating tablet Take 10 mg by mouth as needed for migraine.     . thyroid (ARMOUR THYROID) 30 MG tablet Take 30 mg by mouth daily before breakfast.     . TOPROL XL 100 MG 24 hr tablet Take 100 mg by mouth daily.     .Marland KitchenUNABLE TO FIND Take 1 tablet by mouth 2 (two) times daily. Med Name: OBonney Leitzcalcium supplement  800-12057mdaily (EACH IS 400 MG)    . UNABLE TO FIND Take 1 capsule by mouth daily. BETAGLUCAN (HERBAL) 400MG CAPSULE. 1 CAPSULE DAILY.    . Cristino Martesoot 100 MG CAPS Take 1 capsule by mouth at bedtime as needed (SLEEP).     . Vilazodone HCl (VIIBRYD STARTER  PACK) 10 & 20 MG KIT Take 5 mg by mouth daily for 7 days, THEN 10 mg daily. 2 kit 0  . zolpidem (AMBIEN) 10 MG tablet Take 10 mg by mouth at bedtime. Takes 5-10 mg po QHS  0  . HYDROcodone-ibuprofen (VICOPROFEN) 7.5-200 MG per tablet Take 1 tablet by mouth every 8 (eight) hours as needed for moderate pain. (Patient not taking: Reported on 10/26/2018) 30 tablet 0  . ibuprofen (ADVIL) 200 MG tablet Take 1 tablet (200 mg total) by mouth every 6 (six) hours as needed. (Patient taking differently: Take 200 mg by mouth every 6 (six) hours as needed for mild pain or moderate pain. ) 30 tablet 0  .  Lidocaine 0.5 % AERO Place 1 spray into both nostrils See admin instructions. CAN ONLY TAKE ONCE WEEKLY FOR MIGRAINES    . tiZANidine (ZANAFLEX) 4 MG tablet Take 1 tablet by mouth daily as needed (MIGRAINES).      No current facility-administered medications for this visit.    Facility-Administered Medications Ordered in Other Visits  Medication Dose Route Frequency Provider Last Rate Last Dose  . ondansetron (ZOFRAN) 4 mg in sodium chloride 0.9 % 50 mL IVPB  4 mg Intravenous Q6H PRN Kristeen Miss, MD        Medication Side Effects: Other: More migraines, impaired concentration, occ diarrhea  Allergies:  Allergies  Allergen Reactions  . Effexor [Venlafaxine Hcl]     Weight loss and cant sleep  . Flexeril [Cyclobenzaprine] Hives    Past Medical History:  Diagnosis Date  . Back pain of lumbar region with sciatica   . Cancer [facility])    breast  . Family history of anesthesia complication    grandfather died under anesthesia 52's- had heart issues  . H/O carpal tunnel repair 14  . Headache(784.0)   . Hypertension   . Hypothyroidism   . PONV (postoperative nausea and vomiting)   . Sleep apnea    cpap since 10    Family History  Problem Relation Age of Onset  . Hyperlipidemia Mother   . Heart disease Father   . Hypertension Father   . Diabetes Father   . Cancer Maternal Grandmother         breast cancer  . Diabetes Paternal Grandmother   . Heart disease Paternal Grandfather   . Hypertension Paternal Grandfather   . Cancer Maternal Aunt        Breast - In 24's  . Cancer Paternal Aunt        Breast - in 85's  . Suicidality Other     Social History   Socioeconomic History  . Marital status: Married    Spouse name: Clair Gulling  . Number of children: 3  . Years of education: college  . Highest education level: Not on file  Occupational History  . Occupation: N/A  Social Needs  . Financial resource strain: Not on file  . Food insecurity    Worry: Not on file    Inability: Not on file  . Transportation needs    Medical: Not on file    Non-medical: Not on file  Tobacco Use  . Smoking status: Never Smoker  . Smokeless tobacco: Never Used  . Tobacco comment: occ alcohol  Substance and Sexual Activity  . Alcohol use: Yes    Comment: socially  . Drug use: No  . Sexual activity: Not on file  Lifestyle  . Physical activity    Days per week: Not on file    Minutes per session: Not on file  . Stress: Not on file  Relationships  . Social Herbalist on phone: Not on file    Gets together: Not on file    Attends religious service: Not on file    Active member of club or organization: Not on file    Attends meetings of clubs or organizations: Not on file    Relationship status: Not on file  . Intimate partner violence    Fear of current or ex partner: Not on file    Emotionally abused: Not on file    Physically abused: Not on file    Forced sexual activity: Not on file  Other  Topics Concern  . Not on file  Social History Narrative   Denies caffeine use     Past Medical History, Surgical history, Social history, and Family history were reviewed and updated as appropriate.   Please see review of systems for further details on the patient's review from today.   Objective:   BP has been elevated  Physical Exam:  There were no vitals taken for this  visit.  Physical Exam Neurological:     Mental Status: She is alert and oriented to person, place, and time.     Cranial Nerves: No dysarthria.  Psychiatric:        Attention and Perception: Attention normal.        Mood and Affect: Mood is anxious.        Speech: Speech normal.        Behavior: Behavior is cooperative.        Thought Content: Thought content normal. Thought content is not paranoid or delusional. Thought content does not include homicidal or suicidal ideation. Thought content does not include homicidal or suicidal plan.        Cognition and Memory: Cognition and memory normal.        Judgment: Judgment normal.     Comments: Mood presents as less anxious and less depressed compared to last exam Insight intact     Lab Review:     Component Value Date/Time   NA 140 01/19/2018 1556   K 4.4 01/19/2018 1556   CL 106 01/19/2018 1556   CO2 25 01/19/2018 1556   GLUCOSE 134 (H) 01/19/2018 1556   BUN 16 01/19/2018 1556   CREATININE 0.73 01/19/2018 1556   CALCIUM 8.9 01/19/2018 1556   PROT 6.8 01/19/2018 1556   ALBUMIN 3.6 01/19/2018 1556   AST 15 01/19/2018 1556   ALT 15 01/19/2018 1556   ALKPHOS 53 01/19/2018 1556   BILITOT 0.3 01/19/2018 1556   GFRNONAA >60 01/19/2018 1556   GFRAA >60 01/19/2018 1556       Component Value Date/Time   WBC 7.1 01/19/2018 1556   WBC 6.2 11/22/2013 1020   RBC 4.35 01/19/2018 1556   HGB 12.2 01/19/2018 1556   HCT 38.8 01/19/2018 1556   PLT 263 01/19/2018 1556   MCV 89.2 01/19/2018 1556   MCH 28.0 01/19/2018 1556   MCHC 31.4 01/19/2018 1556   RDW 13.5 01/19/2018 1556   LYMPHSABS 2.3 01/19/2018 1556   MONOABS 0.6 01/19/2018 1556   EOSABS 0.6 (H) 01/19/2018 1556   BASOSABS 0.1 01/19/2018 1556    No results found for: POCLITH, LITHIUM   No results found for: PHENYTOIN, PHENOBARB, VALPROATE, CBMZ   .res Assessment: Plan:   Pt seen for 30 minutes and greater than 50% of session spent counseling pt re: tx options to include  decreasing Viibryd to 5 mg po qd if Viibryd is exacerbating migraines, or continuing Viibryd 10 mg po qd for another 2 weeks to determine full response to Viibryd 10 mg po qd and to determine if migraines improve with Botox injections. Pt reports that she would like to continue Viibryd 10 mg po qd at this time since she feels benefits are outweighing potential risks and she is uncertain if Viibryd is cause of recent worsening in migraines. Advised pt to contact office if migraines become intolerable and will decrease dose to 5 mg po qd.  Recommend continuing therapy with Beckey Downing, LPC. Pt to f/u in 3-4 weeks or sooner if clinically indicated.  Patient advised to  contact office with any questions, adverse effects, or acute worsening in signs and symptoms.  [name] was seen today for anxiety, depression and insomnia.  Diagnoses and all orders for this visit:  Generalized anxiety disorder  Depression, unspecified depression type  Insomnia, unspecified type     Please see After Visit Summary for patient specific instructions.  Future Appointments  Date Time Provider [place]  12/09/2018  3:00 PM Thayer Headings, PMHNP [facility] None    No orders of the defined types were placed in this encounter.     -------------------------------

## 2018-12-07 ENCOUNTER — Telehealth: Payer: Self-pay | Admitting: Psychiatry

## 2018-12-07 NOTE — Telephone Encounter (Signed)
Patient called and said that she just wanted to let you know that on wed she went down to 5 mg on the viibryd.

## 2018-12-09 ENCOUNTER — Ambulatory Visit (INDEPENDENT_AMBULATORY_CARE_PROVIDER_SITE_OTHER): Payer: 59 | Admitting: Psychiatry

## 2018-12-09 ENCOUNTER — Other Ambulatory Visit: Payer: Self-pay

## 2018-12-09 ENCOUNTER — Encounter: Payer: Self-pay | Admitting: Psychiatry

## 2018-12-09 DIAGNOSIS — F329 Major depressive disorder, single episode, unspecified: Secondary | ICD-10-CM | POA: Diagnosis not present

## 2018-12-09 DIAGNOSIS — G4700 Insomnia, unspecified: Secondary | ICD-10-CM

## 2018-12-09 DIAGNOSIS — F411 Generalized anxiety disorder: Secondary | ICD-10-CM | POA: Diagnosis not present

## 2018-12-09 DIAGNOSIS — F32A Depression, unspecified: Secondary | ICD-10-CM

## 2018-12-09 MED ORDER — DAYVIGO 5 MG PO TABS: 5.0000 mg | ORAL_TABLET | Freq: Every day | ORAL | 0 refills | Status: DC

## 2018-12-09 MED ORDER — VIIBRYD 10 MG PO TABS: ORAL_TABLET | ORAL | 1 refills | Status: DC

## 2018-12-09 NOTE — Progress Notes (Signed)
[name] [id number] February 10, 1960 58 y.o.  Subjective:   Patient ID:  [name] is a 58 y.o. (DOB 04-Mar-1960) female.  Chief Complaint:  Chief Complaint  Patient presents with  . Insomnia  . Anxiety  . Depression    HPI [name] presents to the office today for follow-up of anxiety and depression. She reports that Viibryd has been negatively affecting her sleep. Had diarrhea with Viibryd 10 mg po qd.   She and her husband noticed her energy and motivation were lower on Viibryd 10 mg po qd. She reports that she noticed some discontinuation s/s for about 3 days and this resolved.   Reports that she had the start of a panic attack recently and then told the representative that they were not listening to her and she was about to have a panic attack. She reports that she also advocated for herself on a call with the bank. She reports that she forced herself to meet with someone about bathroom repairs that she had been needing to do for at least a month.   Reports that she had made some stressful calls prior to having panic attack. She also had people using a sledge hammer and a drill removing concrete and demolishing things in their house.   She reports that there has been an overall improvement in anxiety. She reports that her mood has been somewhat improved. Has been running errands. She reports that she is now feeling sleepy and ready to go to bed earlier. She has been falling asleep earlier and getting up earlier. Normally sleeps 4-6 hours. She reports that her appetite has been good. Reports some improvement in concentration and has started reading a book again for the first time in months. She reports that she continues to periodically forget things. Denies SI. Occasional hopeless thoughts and feels at times people would be better off without her.   Had a migraine Saturday after loud noises and significant stress. Had not had a migraine for 2 weeks prior to that. Today  was anniversary of double mastectomy.   Continues not to communicate with sister and her mother.   Past Psychiatric Medication Trials: ambien CR Ambien- Has been taking 5-10 mg Intermezzo- Helpful for sleep initiation Xanax Effexor XR-Multiple adverse effects. Severe discontinuation s/s.  Zonisamide- SI.  Lexapro-Concentration difficulty Elavil- Apatheric, difficulty with work Topamax- Anger, irritability Benadryl- partially effective.   Review of Systems:  Review of Systems  Musculoskeletal: Negative for gait problem.       Knee pain due to torn meniscus.   Neurological: Negative for tremors.  Psychiatric/Behavioral:       Please refer to HPI    Medications: None  Current Outpatient Medications  Medication Sig Dispense Refill  . Aspergillus Fumigatus 1:20 SOLN Inject into the skin.    . baclofen (LIORESAL) 20 MG tablet Take 20 mg by mouth daily as needed (MIGRAINES).     . CHELATED MAGNESIUM PO Take 300 mg by mouth daily. 2 TAB (150 MG EACH) DAILY    . diclofenac sodium (VOLTAREN) 1 % GEL Apply topically 4 (four) times daily.    Eduard Roux (AIMOVIG) 140 MG/ML SOAJ Inject into the skin.     Marland Kitchen glucosamine-chondroitin 500-400 MG tablet Take 1 tablet by mouth 3 (three) times daily.    . hydrochlorothiazide (MICROZIDE) 12.5 MG capsule Take 12.5 mg by mouth daily.    . Lidocaine 0.5 % AERO Place 1 spray into both nostrils See admin instructions. CAN ONLY TAKE  ONCE WEEKLY FOR MIGRAINES    . MELATONIN PO Take 3-5 mg by mouth at bedtime as needed (sleep).    . meloxicam (MOBIC) 7.5 MG tablet Take 7.5 mg by mouth at bedtime as needed for pain.    . methocarbamol (ROBAXIN) 500 MG tablet Take 500 mg by mouth at bedtime as needed for muscle spasms.     . naproxen sodium (ANAPROX) 220 MG tablet Take 220 mg by mouth daily as needed.     . NON FORMULARY 70 mg every 30 (thirty) days. '70mg'$  monthly injections for migraines    . OnabotulinumtoxinA (BOTOX IM) Inject into the muscle every 3  (three) months. FOR MIGRAINES    . OVER THE COUNTER MEDICATION     . OVER THE COUNTER MEDICATION Take 250 mg by mouth daily. ASTRAGALUS HERBAL. 1 CAPSULE DAILY    . Psyllium Fiber 0.52 g CAPS Take 4 capsules by mouth every morning. 625 mg per cap    . rizatriptan (MAXALT-MLT) 10 MG disintegrating tablet Take 10 mg by mouth as needed for migraine.     . thyroid (ARMOUR THYROID) 30 MG tablet Take 30 mg by mouth daily before breakfast.     . tiZANidine (ZANAFLEX) 4 MG tablet Take 1 tablet by mouth daily as needed (MIGRAINES).     . TOPROL XL 100 MG 24 hr tablet Take 100 mg by mouth daily.     Marland Kitchen UNABLE TO FIND Take 1 tablet by mouth 2 (two) times daily. Med Name: Bonney Leitz calcium supplement  800-'1200mg'$  daily (EACH IS 400 MG)    . UNABLE TO FIND Take 1 capsule by mouth daily. BETAGLUCAN (HERBAL) '400MG'$  CAPSULE. 1 CAPSULE DAILY.    Cristino Martes Root 100 MG CAPS Take 1 capsule by mouth at bedtime as needed (SLEEP).     . zolpidem (AMBIEN) 10 MG tablet Take 10 mg by mouth at bedtime. Takes 5-10 mg po QHS  0  . HYDROcodone-ibuprofen (VICOPROFEN) 7.5-200 MG per tablet Take 1 tablet by mouth every 8 (eight) hours as needed for moderate pain. (Patient not taking: Reported on 10/26/2018) 30 tablet 0  . ibuprofen (ADVIL) 200 MG tablet Take 1 tablet (200 mg total) by mouth every 6 (six) hours as needed. (Patient taking differently: Take 200 mg by mouth every 6 (six) hours as needed for mild pain or moderate pain. ) 30 tablet 0  . Lemborexant (DAYVIGO) 5 MG TABS Take 5 mg by mouth at bedtime. 10 tablet 0  . Vilazodone HCl (VIIBRYD STARTER PACK) 10 & 20 MG KIT Take 5 mg by mouth daily for 7 days, THEN 10 mg daily. 2 kit 0  . Vilazodone HCl (VIIBRYD) 10 MG TABS Take 1/2-1 tab po qd 90 tablet 1   No current facility-administered medications for this visit.    Facility-Administered Medications Ordered in Other Visits  Medication Dose Route Frequency Provider Last Rate Last Dose  . ondansetron (ZOFRAN) 4 mg in  sodium chloride 0.9 % 50 mL IVPB  4 mg Intravenous Q6H PRN Kristeen Miss, MD        Medication Side Effects: None  Allergies:  Allergies  Allergen Reactions  . Effexor [Venlafaxine Hcl]     Weight loss and cant sleep  . Flexeril [Cyclobenzaprine] Hives    Past Medical History:  Diagnosis Date  . Back pain of lumbar region with sciatica   . Cancer [facility])    breast  . Family history of anesthesia complication    grandfather died under anesthesia 12's- had  heart issues  . H/O carpal tunnel repair 14  . Headache(784.0)   . Hypertension   . Hypothyroidism   . PONV (postoperative nausea and vomiting)   . Sleep apnea    cpap since 10    Family History  Problem Relation Age of Onset  . Hyperlipidemia Mother   . Heart disease Father   . Hypertension Father   . Diabetes Father   . Cancer Maternal Grandmother        breast cancer  . Diabetes Paternal Grandmother   . Heart disease Paternal Grandfather   . Hypertension Paternal Grandfather   . Cancer Maternal Aunt        Breast - In 76's  . Cancer Paternal Aunt        Breast - in 19's  . Suicidality Other     Social History   Socioeconomic History  . Marital status: Married    Spouse name: Clair Gulling  . Number of children: 3  . Years of education: college  . Highest education level: Not on file  Occupational History  . Occupation: N/A  Social Needs  . Financial resource strain: Not on file  . Food insecurity    Worry: Not on file    Inability: Not on file  . Transportation needs    Medical: Not on file    Non-medical: Not on file  Tobacco Use  . Smoking status: Never Smoker  . Smokeless tobacco: Never Used  . Tobacco comment: occ alcohol  Substance and Sexual Activity  . Alcohol use: Yes    Comment: socially  . Drug use: No  . Sexual activity: Not on file  Lifestyle  . Physical activity    Days per week: Not on file    Minutes per session: Not on file  . Stress: Not on file  Relationships  . Social  Herbalist on phone: Not on file    Gets together: Not on file    Attends religious service: Not on file    Active member of club or organization: Not on file    Attends meetings of clubs or organizations: Not on file    Relationship status: Not on file  . Intimate partner violence    Fear of current or ex partner: Not on file    Emotionally abused: Not on file    Physically abused: Not on file    Forced sexual activity: Not on file  Other Topics Concern  . Not on file  Social History Narrative   Denies caffeine use     Past Medical History, Surgical history, Social history, and Family history were reviewed and updated as appropriate.   Please see review of systems for further details on the patient's review from today.   Objective:   Physical Exam:  There were no vitals taken for this visit.  Physical Exam Constitutional:      General: She is not in acute distress.    Appearance: She is well-developed.  Musculoskeletal:        General: No deformity.  Neurological:     Mental Status: She is alert and oriented to person, place, and time.     Coordination: Coordination normal.  Psychiatric:        Attention and Perception: Attention and perception normal. She does not perceive auditory or visual hallucinations.        Mood and Affect: Affect is not labile, blunt, angry or inappropriate.        Speech:  Speech normal.        Behavior: Behavior normal.        Thought Content: Thought content normal. Thought content does not include homicidal or suicidal ideation. Thought content does not include homicidal or suicidal plan.        Cognition and Memory: Cognition and memory normal.        Judgment: Judgment normal.     Comments: Pt presents as less anxious and less depressed compared to last exam. Insight intact. No delusions.      Lab Review:     Component Value Date/Time   NA 140 01/19/2018 1556   K 4.4 01/19/2018 1556   CL 106 01/19/2018 1556   CO2 25  01/19/2018 1556   GLUCOSE 134 (H) 01/19/2018 1556   BUN 16 01/19/2018 1556   CREATININE 0.73 01/19/2018 1556   CALCIUM 8.9 01/19/2018 1556   PROT 6.8 01/19/2018 1556   ALBUMIN 3.6 01/19/2018 1556   AST 15 01/19/2018 1556   ALT 15 01/19/2018 1556   ALKPHOS 53 01/19/2018 1556   BILITOT 0.3 01/19/2018 1556   GFRNONAA >60 01/19/2018 1556   GFRAA >60 01/19/2018 1556       Component Value Date/Time   WBC 7.1 01/19/2018 1556   WBC 6.2 11/22/2013 1020   RBC 4.35 01/19/2018 1556   HGB 12.2 01/19/2018 1556   HCT 38.8 01/19/2018 1556   PLT 263 01/19/2018 1556   MCV 89.2 01/19/2018 1556   MCH 28.0 01/19/2018 1556   MCHC 31.4 01/19/2018 1556   RDW 13.5 01/19/2018 1556   LYMPHSABS 2.3 01/19/2018 1556   MONOABS 0.6 01/19/2018 1556   EOSABS 0.6 (H) 01/19/2018 1556   BASOSABS 0.1 01/19/2018 1556    No results found for: POCLITH, LITHIUM   No results found for: PHENYTOIN, PHENOBARB, VALPROATE, CBMZ   .res Assessment: Plan:   Pt seen for 45 minutes and greater than 50% of session spent counseling pt re: tx plan and possible tx options for insomnia to include potential benefits, risks, and side effects of Dayvigo. Answered pt's questions re: possible long term safety risks of Dayvigo vs. Ambien. Recommend taking Ambien 5 mg poQHS and Dayvigo 5 mg po QHS initially and that she may increase Dayvigo to 10 mg poQHS if her sleep has not improved after 3-5 days and she is not having tolerability issues. Discussed that she could stop Ambien if sleep is significantly improved with Dayvigo. Discussed continuing Viibryd 5 mg po qd for anxiety and depression since she has had improved s/s with low dose and has some tolerability issues with 10 mg po qd. Recommend continuing psychotherapy with Beckey Downing, LPC. Pt to f/u with this provider in 4 weeks or sooner if clinically indicated. Patient advised to contact office with any questions, adverse effects, or acute worsening in signs and symptoms.  Kirsi was  seen today for insomnia, anxiety and depression.  Diagnoses and all orders for this visit:  Insomnia, unspecified type -     Lemborexant (DAYVIGO) 5 MG TABS; Take 5 mg by mouth at bedtime.  Generalized anxiety disorder -     Vilazodone HCl (VIIBRYD) 10 MG TABS; Take 1/2-1 tab po qd  Depression, unspecified depression type -     Vilazodone HCl (VIIBRYD) 10 MG TABS; Take 1/2-1 tab po qd     Please see After Visit Summary for patient specific instructions.  Future Appointments  Date Time Provider [place]  01/05/2019 11:00 AM Thayer Headings, PMHNP [facility] None  No orders of the defined types were placed in this encounter.   -------------------------------

## 2018-12-10 ENCOUNTER — Telehealth: Payer: Self-pay

## 2018-12-10 NOTE — Telephone Encounter (Signed)
Prior authorization submitted and approved for Viibryd 10 mg through [facility] effective 11/10/2018-12/10/2019 [id number]

## 2018-12-15 ENCOUNTER — Telehealth: Payer: Self-pay | Admitting: Psychiatry

## 2018-12-15 NOTE — Telephone Encounter (Signed)
Patient called and said that the dayvego samples you gave her are working and she would like you to send in a script of the medicine and  Start working on the pa that will be needed for the medicine

## 2018-12-16 ENCOUNTER — Other Ambulatory Visit: Payer: Self-pay

## 2018-12-16 DIAGNOSIS — G4700 Insomnia, unspecified: Secondary | ICD-10-CM

## 2018-12-16 MED ORDER — DAYVIGO 5 MG PO TABS: 5.0000 mg | ORAL_TABLET | Freq: Every day | ORAL | 1 refills | Status: DC

## 2018-12-16 NOTE — Telephone Encounter (Signed)
Patient is taking 5 mg Dayvigo, will submit Rx to get process started

## 2018-12-16 NOTE — Telephone Encounter (Signed)
Thank you :)

## 2019-01-05 ENCOUNTER — Other Ambulatory Visit: Payer: Self-pay

## 2019-01-05 ENCOUNTER — Ambulatory Visit (INDEPENDENT_AMBULATORY_CARE_PROVIDER_SITE_OTHER): Payer: 59 | Admitting: Psychiatry

## 2019-01-05 ENCOUNTER — Encounter: Payer: Self-pay | Admitting: Psychiatry

## 2019-01-05 DIAGNOSIS — F329 Major depressive disorder, single episode, unspecified: Secondary | ICD-10-CM

## 2019-01-05 DIAGNOSIS — G4700 Insomnia, unspecified: Secondary | ICD-10-CM | POA: Diagnosis not present

## 2019-01-05 DIAGNOSIS — F411 Generalized anxiety disorder: Secondary | ICD-10-CM | POA: Diagnosis not present

## 2019-01-05 DIAGNOSIS — F32A Depression, unspecified: Secondary | ICD-10-CM

## 2019-01-05 MED ORDER — DAYVIGO 10 MG PO TABS: 10.0000 mg | ORAL_TABLET | Freq: Every day | ORAL | 0 refills | Status: DC

## 2019-01-05 MED ORDER — DOXAZOSIN MESYLATE 4 MG PO TABS: ORAL_TABLET | ORAL | 1 refills | Status: DC

## 2019-01-05 NOTE — Progress Notes (Signed)
[name] [id number] Oct 17, 1960 58 y.o.  Virtual Visit via Telephone Note  I connected with pt on 01/05/19 at 11:00 AM EST by telephone and verified that I am speaking with the correct person using two identifiers.   I discussed the limitations, risks, security and privacy concerns of performing an evaluation and management service by telephone and the availability of in person appointments. I also discussed with the patient that there may be a patient responsible charge related to this service. The patient expressed understanding and agreed to proceed.   I discussed the assessment and treatment plan with the patient. The patient was provided an opportunity to ask questions and all were answered. The patient agreed with the plan and demonstrated an understanding of the instructions.   The patient was advised to call back or seek an in-person evaluation if the symptoms worsen or if the condition fails to improve as anticipated.  I provided 30 minutes of non-face-to-face time during this encounter.  The patient was located at home.  The provider was located at [place].   Thayer Headings, PMHNP   Subjective:   Patient ID:  [name] is a 58 y.o. (DOB July 31, 1960) female.  Chief Complaint:  Chief Complaint  Patient presents with  . Sleeping Problem  . Anxiety  . Depression    HPI [name] presents for follow-up of anxiety, depression, and insomnia. She reports that she continues to have some difficulty with sleep. Not able to fall asleep until 2 am last night. She reports that Dayvigo seems to be helping with her sleep and some nights it does not take effect until later and then sleeps soundly. Other times Dayvigo is effective and falls asleep within 30 minutes, about 65-70% of the time. She reports that if she awakens to go to the bathroom she then has trouble getting back to sleep. She has not tried taking Dayvigo 10 mg po QHS. She reports that she took  Intermezzo once when she had to drive to [place] the next day.   She reports that she continues to have frequent vivid dreams. Estimates vivid dream occur about 80%. She reports that she has had vivid dreams in the past. She reports that vivid dreams are not re-experiencing of past events. Tends to have vivid dreams in the early morning hours.   Has started brain spotting and feels that this has been helpful. Reports improved anxiety. She reports improved mood.  She reports that her energy has been low. Denies SI.   Continues to have mold remediation done at her home. Had recent contact with her mother.   Past Psychiatric Medication Trials: ambien CR Ambien- Has been taking 5-10 mg Intermezzo- Helpful for sleep initiation Xanax Effexor XR-Multiple adverse effects. Severe discontinuation s/s.  Zonisamide- SI.  Lexapro-Concentration difficulty Viibryd Elavil- Apatheric, difficulty with work Topamax- Anger, irritability Benadryl- partially effective.  Dayvigo- Effective. Has mild drowsiness the following day.   Review of Systems:  Review of Systems  Musculoskeletal: Negative for gait problem.       Knee pain  Neurological: Negative for tremors.  Psychiatric/Behavioral:       Please refer to HPI    Medications: I have reviewed the patient's current medications.  Current Outpatient Medications  Medication Sig Dispense Refill  . Aspergillus Fumigatus 1:20 SOLN Inject into the skin.    . baclofen (LIORESAL) 20 MG tablet Take 20 mg by mouth daily as needed (MIGRAINES).     . CHELATED MAGNESIUM PO Take 300  mg by mouth daily. 2 TAB (150 MG EACH) DAILY    . diclofenac sodium (VOLTAREN) 1 % GEL Apply topically 4 (four) times daily.    Eduard Roux (AIMOVIG) 140 MG/ML SOAJ Inject into the skin.     Marland Kitchen glucosamine-chondroitin 500-400 MG tablet Take 1 tablet by mouth 3 (three) times daily.    . hydrochlorothiazide (MICROZIDE) 12.5 MG capsule Take 12.5 mg by mouth daily.    Marland Kitchen ibuprofen  (ADVIL) 200 MG tablet Take 1 tablet (200 mg total) by mouth every 6 (six) hours as needed. (Patient taking differently: Take 200 mg by mouth every 6 (six) hours as needed for mild pain or moderate pain. ) 30 tablet 0  . Lemborexant (DAYVIGO) 5 MG TABS Take 5 mg by mouth at bedtime. 30 tablet 1  . Valerian Root 100 MG CAPS Take 1 capsule by mouth at bedtime as needed (SLEEP).     . Vilazodone HCl (VIIBRYD) 10 MG TABS Take 1/2-1 tab po qd 90 tablet 1  . doxazosin (CARDURA) 4 MG tablet Take 1/2 tab po QHS x 5 days, then increase to 1 tab po QHS if ineffective and no side effects 30 tablet 1  . HYDROcodone-ibuprofen (VICOPROFEN) 7.5-200 MG per tablet Take 1 tablet by mouth every 8 (eight) hours as needed for moderate pain. (Patient not taking: Reported on 10/26/2018) 30 tablet 0  . Lemborexant (DAYVIGO) 10 MG TABS Take 10 mg by mouth at bedtime. 90 tablet 0  . Lidocaine 0.5 % AERO Place 1 spray into both nostrils See admin instructions. CAN ONLY TAKE ONCE WEEKLY FOR MIGRAINES    . MELATONIN PO Take 3-5 mg by mouth at bedtime as needed (sleep).    . meloxicam (MOBIC) 7.5 MG tablet Take 7.5 mg by mouth at bedtime as needed for pain.    . methocarbamol (ROBAXIN) 500 MG tablet Take 500 mg by mouth at bedtime as needed for muscle spasms.     . naproxen sodium (ANAPROX) 220 MG tablet Take 220 mg by mouth daily as needed.     . NON FORMULARY 70 mg every 30 (thirty) days. 70m monthly injections for migraines    . OnabotulinumtoxinA (BOTOX IM) Inject into the muscle every 3 (three) months. FOR MIGRAINES    . OVER THE COUNTER MEDICATION     . OVER THE COUNTER MEDICATION Take 250 mg by mouth daily. ASTRAGALUS HERBAL. 1 CAPSULE DAILY    . Psyllium Fiber 0.52 g CAPS Take 4 capsules by mouth every morning. 625 mg per cap    . rizatriptan (MAXALT-MLT) 10 MG disintegrating tablet Take 10 mg by mouth as needed for migraine.     . thyroid (ARMOUR THYROID) 30 MG tablet Take 30 mg by mouth daily before breakfast.     .  tiZANidine (ZANAFLEX) 4 MG tablet Take 1 tablet by mouth daily as needed (MIGRAINES).     . TOPROL XL 100 MG 24 hr tablet Take 100 mg by mouth daily.     .Marland KitchenUNABLE TO FIND Take 1 tablet by mouth 2 (two) times daily. Med Name: OBonney Leitzcalcium supplement  800-12025mdaily (EACH IS 400 MG)    . UNABLE TO FIND Take 1 capsule by mouth daily. BETAGLUCAN (HERBAL) 400MG CAPSULE. 1 CAPSULE DAILY.    . Vilazodone HCl (VIIBRYD STARTER PACK) 10 & 20 MG KIT Take 5 mg by mouth daily for 7 days, THEN 10 mg daily. 2 kit 0  . zolpidem (AMBIEN) 10 MG tablet Take 10 mg by mouth  at bedtime. Takes 5-10 mg po QHS  0   No current facility-administered medications for this visit.   Facility-Administered Medications Ordered in Other Visits  Medication Dose Route Frequency Provider Last Rate Last Admin  . ondansetron (ZOFRAN) 4 mg in sodium chloride 0.9 % 50 mL IVPB  4 mg Intravenous Q6H PRN Kristeen Miss, MD        Medication Side Effects: Other: Vivid dreams  Allergies:  Allergies  Allergen Reactions  . Effexor [Venlafaxine Hcl]     Weight loss and cant sleep  . Flexeril [Cyclobenzaprine] Hives    Past Medical History:  Diagnosis Date  . Back pain of lumbar region with sciatica   . Cancer [facility])    breast  . Family history of anesthesia complication    grandfather died under anesthesia 75's- had heart issues  . H/O carpal tunnel repair 14  . Headache(784.0)   . Hypertension   . Hypothyroidism   . PONV (postoperative nausea and vomiting)   . Sleep apnea    cpap since 10    Family History  Problem Relation Age of Onset  . Hyperlipidemia Mother   . Heart disease Father   . Hypertension Father   . Diabetes Father   . Cancer Maternal Grandmother        breast cancer  . Diabetes Paternal Grandmother   . Heart disease Paternal Grandfather   . Hypertension Paternal Grandfather   . Cancer Maternal Aunt        Breast - In 57's  . Cancer Paternal Aunt        Breast - in 25's  . Suicidality  Other     Social History   Socioeconomic History  . Marital status: Married    Spouse name: Clair Gulling  . Number of children: 3  . Years of education: college  . Highest education level: Not on file  Occupational History  . Occupation: N/A  Tobacco Use  . Smoking status: Never Smoker  . Smokeless tobacco: Never Used  . Tobacco comment: occ alcohol  Substance and Sexual Activity  . Alcohol use: Yes    Comment: socially  . Drug use: No  . Sexual activity: Not on file  Other Topics Concern  . Not on file  Social History Narrative   Denies caffeine use    Social Determinants of Health   Financial Resource Strain:   . Difficulty of Paying Living Expenses: Not on file  Food Insecurity:   . Worried About Charity fundraiser in the Last Year: Not on file  . Ran Out of Food in the Last Year: Not on file  Transportation Needs:   . Lack of Transportation (Medical): Not on file  . Lack of Transportation (Non-Medical): Not on file  Physical Activity:   . Days of Exercise per Week: Not on file  . Minutes of Exercise per Session: Not on file  Stress:   . Feeling of Stress : Not on file  Social Connections:   . Frequency of Communication with Friends and Family: Not on file  . Frequency of Social Gatherings with Friends and Family: Not on file  . Attends Religious Services: Not on file  . Active Member of Clubs or Organizations: Not on file  . Attends Archivist Meetings: Not on file  . Marital Status: Not on file  Intimate Partner Violence:   . Fear of Current or Ex-Partner: Not on file  . Emotionally Abused: Not on file  . Physically Abused:  Not on file  . Sexually Abused: Not on file    Past Medical History, Surgical history, Social history, and Family history were reviewed and updated as appropriate.   Please see review of systems for further details on the patient's review from today.   Objective:   Physical Exam:  There were no vitals taken for this  visit.  Physical Exam Neurological:     Mental Status: She is alert and oriented to person, place, and time.     Cranial Nerves: No dysarthria.  Psychiatric:        Attention and Perception: Attention and perception normal.        Mood and Affect: Mood normal.        Speech: Speech normal.        Behavior: Behavior is cooperative.        Thought Content: Thought content normal. Thought content is not paranoid or delusional. Thought content does not include homicidal or suicidal ideation. Thought content does not include homicidal or suicidal plan.        Cognition and Memory: Cognition and memory normal.        Judgment: Judgment normal.     Comments: Insight intact     Lab Review:     Component Value Date/Time   NA 140 01/19/2018 1556   K 4.4 01/19/2018 1556   CL 106 01/19/2018 1556   CO2 25 01/19/2018 1556   GLUCOSE 134 (H) 01/19/2018 1556   BUN 16 01/19/2018 1556   CREATININE 0.73 01/19/2018 1556   CALCIUM 8.9 01/19/2018 1556   PROT 6.8 01/19/2018 1556   ALBUMIN 3.6 01/19/2018 1556   AST 15 01/19/2018 1556   ALT 15 01/19/2018 1556   ALKPHOS 53 01/19/2018 1556   BILITOT 0.3 01/19/2018 1556   GFRNONAA >60 01/19/2018 1556   GFRAA >60 01/19/2018 1556       Component Value Date/Time   WBC 7.1 01/19/2018 1556   WBC 6.2 11/22/2013 1020   RBC 4.35 01/19/2018 1556   HGB 12.2 01/19/2018 1556   HCT 38.8 01/19/2018 1556   PLT 263 01/19/2018 1556   MCV 89.2 01/19/2018 1556   MCH 28.0 01/19/2018 1556   MCHC 31.4 01/19/2018 1556   RDW 13.5 01/19/2018 1556   LYMPHSABS 2.3 01/19/2018 1556   MONOABS 0.6 01/19/2018 1556   EOSABS 0.6 (H) 01/19/2018 1556   BASOSABS 0.1 01/19/2018 1556    No results found for: POCLITH, LITHIUM   No results found for: PHENYTOIN, PHENOBARB, VALPROATE, CBMZ   .res Assessment: Plan:   Patient seen for 30 minutes and greater than 50% of visit spent counseling patient regarding vivid dreams and nightmares, to include possible causes and ways to  decrease vivid dreams.  Discussed that vivid dreams can be a medication side effect with antidepressants, such as Viibryd, or also with Dayvigo.  Discussed that vivid dreams and nightmares can also become more frequent when processing past traumatic events.  Discussed potential benefits, risks, and side effects of doxazosin to possibly suppress vivid dreams.  Advised patient to start doxazosin 4 mg one half tab at bedtime for 5 days, and then to increase to 1 tab p.o. nightly if vivid dreams have not improved and she is not having any tolerability issues. Discussed continuing Dayvigo 5 mg at this time until vivid dreams improve since increasing dose may potentially exacerbate vivid dreams.  Patient request prescription for Dayvigo 10 mg 1/2-1 tab at bedtime in the event that dose is increased in the future.  Continue Viibryd 5 mg p.o. daily since patient reports an overall improvement in anxiety signs and symptoms since starting Viibryd. Recommend continuing psychotherapy. Patient to follow-up in 4 weeks or sooner if clinically indicated. Patient advised to contact office with any questions, adverse effects, or acute worsening in signs and symptoms.  Cniyah was seen today for sleeping problem, anxiety and depression.  Diagnoses and all orders for this visit:  Insomnia, unspecified type -     doxazosin (CARDURA) 4 MG tablet; Take 1/2 tab po QHS x 5 days, then increase to 1 tab po QHS if ineffective and no side effects -     Lemborexant (DAYVIGO) 10 MG TABS; Take 10 mg by mouth at bedtime.  Generalized anxiety disorder  Depression, unspecified depression type    Please see After Visit Summary for patient specific instructions.  No future appointments.  No orders of the defined types were placed in this encounter.     -------------------------------

## 2019-01-06 ENCOUNTER — Telehealth: Payer: Self-pay | Admitting: Psychiatry

## 2019-01-06 ENCOUNTER — Other Ambulatory Visit: Payer: Self-pay

## 2019-01-06 DIAGNOSIS — G4700 Insomnia, unspecified: Secondary | ICD-10-CM

## 2019-01-06 MED ORDER — DOXAZOSIN MESYLATE 4 MG PO TABS: ORAL_TABLET | ORAL | 1 refills | Status: DC

## 2019-01-06 NOTE — Telephone Encounter (Signed)
Rx submitted to [facility] for patient's doxazosin. Noted on the 30 day only of Dayvigo

## 2019-01-06 NOTE — Telephone Encounter (Signed)
Patient called and said that the [facility] the [facility] on [address]. Also she can only have a 30 day supply of the dayvigo . Insurance will not pay for it. So plwase know that the dayvigo script you sent in the pharmacy will on fill 30 day supply

## 2019-01-06 NOTE — Telephone Encounter (Signed)
Change in pharmacy, send to [facility] instead of [facility] for doxazosin.

## 2019-02-09 ENCOUNTER — Encounter: Payer: Self-pay | Admitting: Psychiatry

## 2019-02-09 ENCOUNTER — Ambulatory Visit (INDEPENDENT_AMBULATORY_CARE_PROVIDER_SITE_OTHER): Payer: 59 | Admitting: Psychiatry

## 2019-02-09 DIAGNOSIS — F32A Depression, unspecified: Secondary | ICD-10-CM

## 2019-02-09 DIAGNOSIS — F411 Generalized anxiety disorder: Secondary | ICD-10-CM | POA: Diagnosis not present

## 2019-02-09 DIAGNOSIS — F329 Major depressive disorder, single episode, unspecified: Secondary | ICD-10-CM

## 2019-02-09 DIAGNOSIS — G4700 Insomnia, unspecified: Secondary | ICD-10-CM

## 2019-02-09 MED ORDER — DOXAZOSIN MESYLATE 4 MG PO TABS: 4.0000 mg | ORAL_TABLET | Freq: Every day | ORAL | 1 refills | Status: DC

## 2019-02-09 MED ORDER — VIIBRYD 10 MG PO TABS: ORAL_TABLET | ORAL | 1 refills | Status: DC

## 2019-02-09 MED ORDER — DAYVIGO 10 MG PO TABS: 10.0000 mg | ORAL_TABLET | Freq: Every day | ORAL | 0 refills | Status: DC

## 2019-02-09 NOTE — Progress Notes (Signed)
[name] [id number] [birth year] 59 y.o.  Virtual Visit via Video Note  I connected with pt @ on 02/09/19 at  1:00 PM EST by a video enabled telemedicine application and verified that I am speaking with the correct person using two identifiers.   I discussed the limitations of evaluation and management by telemedicine and the availability of in person appointments. The patient expressed understanding and agreed to proceed.  I discussed the assessment and treatment plan with the patient. The patient was provided an opportunity to ask questions and all were answered. The patient agreed with the plan and demonstrated an understanding of the instructions.   The patient was advised to call back or seek an in-person evaluation if the symptoms worsen or if the condition fails to improve as anticipated.  I provided 30 minutes of non-face-to-face time during this encounter.  The patient was located at home.  The provider was located at [place].   Thayer Headings, PMHNP   Subjective:   Patient ID:  [name] is a 59 y.o. (DOB 06-Aug-1960) female.  Chief Complaint:  Chief Complaint  Patient presents with  . Follow-up    h/o Anxiety, Insomnia, and Depression    HPI [name] presents for follow-up of anxiety, insomnia, and depression. She reports that her anxiety has been well controlled. Denies any recent panic attacks. Had migraine yesterday and had anxiety with cancelling apt for brainspotting. She reports that her mood has been improving. She reports that her energy and motivation have improved with being more active and getting back into yoga. She reports that she has gained 8 lbs over the holidays since Thanksgiving. Reports that she is occ stress eating. Denies binge eating. She reports that her concentration is impaired first thing in the morning and is typically able to concentrate well by mid-day. Has been reading and knitting again. Denies SI.   Continues  to have vivid dreams nightly and that nightmares have decreased. She reports that she has been having some stress dreams, such as having to straighten up clutter or there are mutliple tasks she needs to complete. Some occ slight difficulty falling asleep. Has been awakened at night when husband is having gout pain. Reports that her best sleeping hours are from 6:30 am- 10 am. Tends to sleep later when sleep has been interrupted during the night. Reports that she feels more rested upon awakening. She reports that she has some initial grogginess upon awakening.   Reports that black mold has been removed from her house and thinks that this has helped her migraines. Had several migraines in December and that migraines have improved in January.   Youngest son returned to school recently. Daughter is in her last semester. Reports that she feels less anxious with daughter completing degree.   Past Psychiatric Medication Trials: ambien CR Ambien- Has been taking 5-10 mg Intermezzo- Helpful for sleep initiation Xanax Effexor XR-Multiple adverse effects. Severe discontinuation s/s.  Zonisamide- SI.  Lexapro-Concentration difficulty Viibryd Elavil- Apatheric, difficulty with work Topamax- Anger, irritability Benadryl- partially effective. Dayvigo- Effective. Has mild drowsiness the following day.   Review of Systems:  Review of Systems  Endocrine:       Reports that she has not had a period since last May.   Musculoskeletal: Positive for back pain. Negative for gait problem.       Knee pain.   Neurological: Negative for tremors.  Psychiatric/Behavioral:       Please refer to HPI  Medications: I have reviewed the patient's current medications.  Current Outpatient Medications  Medication Sig Dispense Refill  . Aspergillus Fumigatus 1:20 SOLN Inject into the skin.    . baclofen (LIORESAL) 20 MG tablet Take 20 mg by mouth daily as needed (MIGRAINES).     . CHELATED MAGNESIUM PO Take 300 mg  by mouth daily. 2 TAB (150 MG EACH) DAILY    . diclofenac sodium (VOLTAREN) 1 % GEL Apply topically 4 (four) times daily.    Marland Kitchen doxazosin (CARDURA) 4 MG tablet Take 1 tablet (4 mg total) by mouth at bedtime. 90 tablet 1  . Erenumab-aooe (AIMOVIG) 140 MG/ML SOAJ Inject into the skin.     Marland Kitchen glucosamine-chondroitin 500-400 MG tablet Take 1 tablet by mouth 3 (three) times daily.    . hydrochlorothiazide (MICROZIDE) 12.5 MG capsule Take 12.5 mg by mouth daily.    . Lemborexant (DAYVIGO) 10 MG TABS Take 10 mg by mouth at bedtime. Take 1/2-1 tab po QHS 90 tablet 0  . HYDROcodone-ibuprofen (VICOPROFEN) 7.5-200 MG per tablet Take 1 tablet by mouth every 8 (eight) hours as needed for moderate pain. (Patient not taking: Reported on 10/26/2018) 30 tablet 0  . ibuprofen (ADVIL) 200 MG tablet Take 1 tablet (200 mg total) by mouth every 6 (six) hours as needed. (Patient taking differently: Take 200 mg by mouth every 6 (six) hours as needed for mild pain or moderate pain. ) 30 tablet 0  . Lidocaine 0.5 % AERO Place 1 spray into both nostrils See admin instructions. CAN ONLY TAKE ONCE WEEKLY FOR MIGRAINES    . MELATONIN PO Take 3-5 mg by mouth at bedtime as needed (sleep).    . meloxicam (MOBIC) 7.5 MG tablet Take 7.5 mg by mouth at bedtime as needed for pain.    . methocarbamol (ROBAXIN) 500 MG tablet Take 500 mg by mouth at bedtime as needed for muscle spasms.     . naproxen sodium (ANAPROX) 220 MG tablet Take 220 mg by mouth daily as needed.     . NON FORMULARY 70 mg every 30 (thirty) days. 70mg  monthly injections for migraines    . OnabotulinumtoxinA (BOTOX IM) Inject into the muscle every 3 (three) months. FOR MIGRAINES    . OVER THE COUNTER MEDICATION     . OVER THE COUNTER MEDICATION Take 250 mg by mouth daily. ASTRAGALUS HERBAL. 1 CAPSULE DAILY    . Psyllium Fiber 0.52 g CAPS Take 4 capsules by mouth every morning. 625 mg per cap    . rizatriptan (MAXALT-MLT) 10 MG disintegrating tablet Take 10 mg by mouth  as needed for migraine.     . thyroid (ARMOUR THYROID) 30 MG tablet Take 30 mg by mouth daily before breakfast.     . tiZANidine (ZANAFLEX) 4 MG tablet Take 1 tablet by mouth daily as needed (MIGRAINES).     . TOPROL XL 100 MG 24 hr tablet Take 100 mg by mouth daily.     Marland Kitchen UNABLE TO FIND Take 1 tablet by mouth 2 (two) times daily. Med Name: Bonney Leitz calcium supplement  800-1200mg  daily (EACH IS 400 MG)    . UNABLE TO FIND Take 1 capsule by mouth daily. BETAGLUCAN (HERBAL) 400MG  CAPSULE. 1 CAPSULE DAILY.    Cristino Martes Root 100 MG CAPS Take 1 capsule by mouth at bedtime as needed (SLEEP).     . Vilazodone HCl (VIIBRYD) 10 MG TABS Take 1/2-1 tab po qd 90 tablet 1   No current facility-administered medications for this  visit.   Facility-Administered Medications Ordered in Other Visits  Medication Dose Route Frequency Provider Last Rate Last Admin  . ondansetron (ZOFRAN) 4 mg in sodium chloride 0.9 % 50 mL IVPB  4 mg Intravenous Q6H PRN Kristeen Miss, MD        Medication Side Effects: Other: Vivid dreams  Allergies:  Allergies  Allergen Reactions  . Effexor [Venlafaxine Hcl]     Weight loss and cant sleep  . Flexeril [Cyclobenzaprine] Hives    Past Medical History:  Diagnosis Date  . Back pain of lumbar region with sciatica   . Cancer [facility])    breast  . Family history of anesthesia complication    grandfather died under anesthesia 45's- had heart issues  . H/O carpal tunnel repair 14  . Headache(784.0)   . Hypertension   . Hypothyroidism   . PONV (postoperative nausea and vomiting)   . Sleep apnea    cpap since 10    Family History  Problem Relation Age of Onset  . Hyperlipidemia Mother   . Heart disease Father   . Hypertension Father   . Diabetes Father   . Cancer Maternal Grandmother        breast cancer  . Diabetes Paternal Grandmother   . Heart disease Paternal Grandfather   . Hypertension Paternal Grandfather   . Cancer Maternal Aunt        Breast - In 44's   . Cancer Paternal Aunt        Breast - in 84's  . Suicidality Other     Social History   Socioeconomic History  . Marital status: Married    Spouse name: Clair Gulling  . Number of children: 3  . Years of education: college  . Highest education level: Not on file  Occupational History  . Occupation: N/A  Tobacco Use  . Smoking status: Never Smoker  . Smokeless tobacco: Never Used  . Tobacco comment: occ alcohol  Substance and Sexual Activity  . Alcohol use: Yes    Comment: socially  . Drug use: No  . Sexual activity: Not on file  Other Topics Concern  . Not on file  Social History Narrative   Denies caffeine use    Social Determinants of Health   Financial Resource Strain:   . Difficulty of Paying Living Expenses: Not on file  Food Insecurity:   . Worried About Charity fundraiser in the Last Year: Not on file  . Ran Out of Food in the Last Year: Not on file  Transportation Needs:   . Lack of Transportation (Medical): Not on file  . Lack of Transportation (Non-Medical): Not on file  Physical Activity:   . Days of Exercise per Week: Not on file  . Minutes of Exercise per Session: Not on file  Stress:   . Feeling of Stress : Not on file  Social Connections:   . Frequency of Communication with Friends and Family: Not on file  . Frequency of Social Gatherings with Friends and Family: Not on file  . Attends Religious Services: Not on file  . Active Member of Clubs or Organizations: Not on file  . Attends Archivist Meetings: Not on file  . Marital Status: Not on file  Intimate Partner Violence:   . Fear of Current or Ex-Partner: Not on file  . Emotionally Abused: Not on file  . Physically Abused: Not on file  . Sexually Abused: Not on file    Past Medical History, Surgical  history, Social history, and Family history were reviewed and updated as appropriate.   Please see review of systems for further details on the patient's review from today.   Objective:    Physical Exam:  There were no vitals taken for this visit.  Physical Exam Neurological:     Mental Status: She is alert and oriented to person, place, and time.     Cranial Nerves: No dysarthria.  Psychiatric:        Attention and Perception: Attention and perception normal.        Mood and Affect: Mood normal.        Speech: Speech normal.        Behavior: Behavior is cooperative.        Thought Content: Thought content normal. Thought content is not paranoid or delusional. Thought content does not include homicidal or suicidal ideation. Thought content does not include homicidal or suicidal plan.        Cognition and Memory: Cognition and memory normal.        Judgment: Judgment normal.     Comments: Insight intact     Lab Review:     Component Value Date/Time   NA 140 01/19/2018 1556   K 4.4 01/19/2018 1556   CL 106 01/19/2018 1556   CO2 25 01/19/2018 1556   GLUCOSE 134 (H) 01/19/2018 1556   BUN 16 01/19/2018 1556   CREATININE 0.73 01/19/2018 1556   CALCIUM 8.9 01/19/2018 1556   PROT 6.8 01/19/2018 1556   ALBUMIN 3.6 01/19/2018 1556   AST 15 01/19/2018 1556   ALT 15 01/19/2018 1556   ALKPHOS 53 01/19/2018 1556   BILITOT 0.3 01/19/2018 1556   GFRNONAA >60 01/19/2018 1556   GFRAA >60 01/19/2018 1556       Component Value Date/Time   WBC 7.1 01/19/2018 1556   WBC 6.2 11/22/2013 1020   RBC 4.35 01/19/2018 1556   HGB 12.2 01/19/2018 1556   HCT 38.8 01/19/2018 1556   PLT 263 01/19/2018 1556   MCV 89.2 01/19/2018 1556   MCH 28.0 01/19/2018 1556   MCHC 31.4 01/19/2018 1556   RDW 13.5 01/19/2018 1556   LYMPHSABS 2.3 01/19/2018 1556   MONOABS 0.6 01/19/2018 1556   EOSABS 0.6 (H) 01/19/2018 1556   BASOSABS 0.1 01/19/2018 1556    No results found for: POCLITH, LITHIUM   No results found for: PHENYTOIN, PHENOBARB, VALPROATE, CBMZ   .res Assessment: Plan:   Spent 30 minutes with pt discussing insurance coverage of her medications since pt reports concerns  about med coverage with change in insurance plan and learning that prior authorization would be required. Discussed that PA's can be completed by office. [facility]iscussed [facility] since pt now has [facility] plan. Also counseled pt re: possible resources re: weight loss with  [facility] since pt reports that recent weight gain has had a negative effect on her mood. Will continue Viibryd 5 mg po qd for depression and anxiety. Will continue Doxazosin 4 mg po QHS for nightmares. Will continue Dayvigo 5 mg po QHS for insomnia. Recommend continuing psychotherapy.  Pt to f/u with this provider in 6-8 weeks or sooner if clinically indicated.  Patient advised to contact office with any questions, adverse effects, or acute worsening in signs and symptoms.  Saahithi was seen today for follow-up.  Diagnoses and all orders for this visit:  Insomnia, unspecified type -     Lemborexant (DAYVIGO) 10 MG TABS; Take 10  mg by mouth at bedtime. Take 1/2-1 tab po QHS -     doxazosin (CARDURA) 4 MG tablet; Take 1 tablet (4 mg total) by mouth at bedtime.  Generalized anxiety disorder -     Vilazodone HCl (VIIBRYD) 10 MG TABS; Take 1/2-1 tab po qd  Depression, unspecified depression type -     Vilazodone HCl (VIIBRYD) 10 MG TABS; Take 1/2-1 tab po qd     Please see After Visit Summary for patient specific instructions.  No future appointments.  No orders of the defined types were placed in this encounter.     -------------------------------

## 2019-02-10 ENCOUNTER — Telehealth: Payer: Self-pay

## 2019-02-10 NOTE — Telephone Encounter (Signed)
Prior authorization submitted for Dayvigo 10 mg approved effective 02/09/2019-02/09/2020   Prior authorization submitted for Viibryd 10 mg approved effective 02/10/2019-02/10/2020  [facility]   Submitted through cover my meds

## 2019-04-07 ENCOUNTER — Encounter: Payer: Self-pay | Admitting: Psychiatry

## 2019-04-07 ENCOUNTER — Ambulatory Visit (INDEPENDENT_AMBULATORY_CARE_PROVIDER_SITE_OTHER): Payer: 59 | Admitting: Psychiatry

## 2019-04-07 DIAGNOSIS — G4700 Insomnia, unspecified: Secondary | ICD-10-CM

## 2019-04-07 DIAGNOSIS — F411 Generalized anxiety disorder: Secondary | ICD-10-CM | POA: Diagnosis not present

## 2019-04-07 DIAGNOSIS — F329 Major depressive disorder, single episode, unspecified: Secondary | ICD-10-CM | POA: Diagnosis not present

## 2019-04-07 DIAGNOSIS — F32A Depression, unspecified: Secondary | ICD-10-CM

## 2019-04-07 MED ORDER — DOXAZOSIN MESYLATE 4 MG PO TABS: 4.0000 mg | ORAL_TABLET | Freq: Every day | ORAL | 0 refills | Status: DC

## 2019-04-07 MED ORDER — VIIBRYD 10 MG PO TABS: ORAL_TABLET | ORAL | 1 refills | Status: DC

## 2019-04-07 MED ORDER — DAYVIGO 10 MG PO TABS: 10.0000 mg | ORAL_TABLET | Freq: Every day | ORAL | 0 refills | Status: DC

## 2019-04-07 NOTE — Progress Notes (Signed)
SOHEILA Moore [id number] [birth year] 59 y.o.  Virtual Visit via Telephone Note  I connected with pt on 04/07/19 at  1:30 PM EDT by telephone and verified that I am speaking with the correct person using two identifiers.   I discussed the limitations, risks, security and privacy concerns of performing an evaluation and management service by telephone and the availability of in person appointments. I also discussed with the patient that there may be a patient responsible charge related to this service. The patient expressed understanding and agreed to proceed.   I discussed the assessment and treatment plan with the patient. The patient was provided an opportunity to ask questions and all were answered. The patient agreed with the plan and demonstrated an understanding of the instructions.   The patient was advised to call back or seek an in-person evaluation if the symptoms worsen or if the condition fails to improve as anticipated.  I provided 30 minutes of non-face-to-face time during this encounter.  The patient was located at home.  The provider was located at [place].   Thayer Headings, PMHNP   Subjective:   Patient ID:  [name] is a 59 y.o. (DOB [birth year]) female.  Chief Complaint:  Chief Complaint  Patient presents with  . Follow-up    f/u of Depression, anxiety, insomnia    HPI [name] presents for follow-up of anxiety, depression, and insomnia. She reports that she continues to sleep better with Dayvigo. She reports that she feels somewhat tired upon awakening. Typically does not have trouble falling asleep. She reports that she is able to return to sleep easily after being awakened. She reports that she continues to have vivid dreams and that they are not as fearful as they were in the past.   Saw her mother and sister about 3 weeks ago for the first time in over a year and reports that the visit went well. Pt reports that she had some anxiety  in anticipation of this meeting. She reports that overall anxiety has been ok with occ periods of increased anxiety. She has some anticipatory anxiety prior to doing brain spotting and more intensive therapy. Denies any recent panic attacks. Has some worry about layoffs at her husband's work.   She reports that her mood has been stable overall. She reports that she had depressed mood one evening and earlier the next day. She reports that she was not sure what triggered it and it seemed to spontaneously resolved. She recalls that she had physical pain when this occurred. She reports that her energy is lower in the mornings and has difficulty getting going in the morning. She reports some days her energy and motivation do not improve until around lunch time. She reports that her energy is typically the best in the early afternoon. Has been working on a puzzle and has been wanting to knit. Concentration has been adequate and has been reading more. Reports that she has gained weight. She reports that appetite has been about the same. Denies SI.  She reports that she has her vaccine scheduled for Friday.   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   Past Psychiatric Medication Trials: ambien CR Ambien- Has been taking 5-10 mg Intermezzo- Helpful for sleep initiation Xanax Effexor XR-Multiple adverse effects. Severe discontinuation s/s.  Zonisamide- SI.  Lexapro-Concentration difficulty Viibryd Elavil- Apatheric, difficulty with work Topamax- Anger, irritability Benadryl- partially effective. Dayvigo- Effective. Has mild drowsiness the following day.   Review of  Systems:  Review of Systems  HENT: Positive for ear pain.   Musculoskeletal:       Foot pain. Knee Pain and has apt for meniscus tear with ortho specialist.  Neurological: Negative for dizziness.       She reports that her migraines have been much better. No migraines in over 2 weeks.     Medications: I  have reviewed the patient's current medications.  Current Outpatient Medications  Medication Sig Dispense Refill  . Aspergillus Fumigatus 1:20 SOLN Inject into the skin.    . baclofen (LIORESAL) 20 MG tablet Take 20 mg by mouth daily as needed (MIGRAINES).     . CHELATED MAGNESIUM PO Take 300 mg by mouth daily. 2 TAB (150 MG EACH) DAILY    . diclofenac sodium (VOLTAREN) 1 % GEL Apply topically 4 (four) times daily.    Marland Kitchen doxazosin (CARDURA) 4 MG tablet Take 1 tablet (4 mg total) by mouth at bedtime. 90 tablet 0  . Erenumab-aooe (AIMOVIG) 140 MG/ML SOAJ Inject into the skin.     Marland Kitchen glucosamine-chondroitin 500-400 MG tablet Take 1 tablet by mouth 3 (three) times daily.    . hydrochlorothiazide (MICROZIDE) 12.5 MG capsule Take 12.5 mg by mouth daily.    Marland Kitchen HYDROcodone-ibuprofen (VICOPROFEN) 7.5-200 MG per tablet Take 1 tablet by mouth every 8 (eight) hours as needed for moderate pain. 30 tablet 0  . ibuprofen (ADVIL) 200 MG tablet Take 1 tablet (200 mg total) by mouth every 6 (six) hours as needed. (Patient taking differently: Take 200 mg by mouth every 6 (six) hours as needed for mild pain or moderate pain. ) 30 tablet 0  . Lemborexant (DAYVIGO) 10 MG TABS Take 10 mg by mouth at bedtime. Take 1/2-1 tab po QHS 90 tablet 0  . Lidocaine 0.5 % AERO Place 1 spray into both nostrils See admin instructions. CAN ONLY TAKE ONCE WEEKLY FOR MIGRAINES    . MELATONIN PO Take 3-5 mg by mouth at bedtime as needed (sleep).    . meloxicam (MOBIC) 7.5 MG tablet Take 7.5 mg by mouth at bedtime as needed for pain.    . methocarbamol (ROBAXIN) 500 MG tablet Take 500 mg by mouth at bedtime as needed for muscle spasms.     . naproxen sodium (ANAPROX) 220 MG tablet Take 220 mg by mouth daily as needed.     . NON FORMULARY 70 mg every 30 (thirty) days. 70mg  monthly injections for migraines    . OnabotulinumtoxinA (BOTOX IM) Inject into the muscle every 3 (three) months. FOR MIGRAINES    . OVER THE COUNTER MEDICATION     .  OVER THE COUNTER MEDICATION Take 250 mg by mouth daily. ASTRAGALUS HERBAL. 1 CAPSULE DAILY    . Probiotic Product (PROBIOTIC PO) Take by mouth.    . Psyllium Fiber 0.52 g CAPS Take 4 capsules by mouth every morning. 625 mg per cap    . rizatriptan (MAXALT-MLT) 10 MG disintegrating tablet Take 10 mg by mouth as needed for migraine.     . thyroid (ARMOUR THYROID) 30 MG tablet Take 30 mg by mouth daily before breakfast.     . tiZANidine (ZANAFLEX) 4 MG tablet Take 1 tablet by mouth daily as needed (MIGRAINES).     . TOPROL XL 100 MG 24 hr tablet Take 100 mg by mouth daily.     Marland Kitchen UNABLE TO FIND Take 1 tablet by mouth 2 (two) times daily. Med Name: Bonney Leitz calcium supplement  800-1200mg  daily [facility] IS  400 MG)    . UNABLE TO FIND Take 1 capsule by mouth daily. BETAGLUCAN (HERBAL) 400MG  CAPSULE. 1 CAPSULE DAILY.    Cristino Martes Root 100 MG CAPS Take 1 capsule by mouth at bedtime as needed (SLEEP).     . Vilazodone HCl (VIIBRYD) 10 MG TABS Take 1/2-1 tab po qd 90 tablet 1   No current facility-administered medications for this visit.   Facility-Administered Medications Ordered in Other Visits  Medication Dose Route Frequency Provider Last Rate Last Admin  . ondansetron (ZOFRAN) 4 mg in sodium chloride 0.9 % 50 mL IVPB  4 mg Intravenous Q6H PRN Kristeen Miss, MD        Medication Side Effects: Other: Lower energy in the morning  Allergies:  Allergies  Allergen Reactions  . Effexor [Venlafaxine Hcl]     Weight loss and cant sleep  . Flexeril [Cyclobenzaprine] Hives    Past Medical History:  Diagnosis Date  . Back pain of lumbar region with sciatica   . Cancer [facility])    breast  . Family history of anesthesia complication    grandfather died under anesthesia 87's- had heart issues  . H/O carpal tunnel repair 14  . Headache(784.0)   . Hypertension   . Hypothyroidism   . PONV (postoperative nausea and vomiting)   . Sleep apnea    cpap since 10    Family History  Problem Relation Age  of Onset  . Hyperlipidemia Mother   . Heart disease Father   . Hypertension Father   . Diabetes Father   . Cancer Maternal Grandmother        breast cancer  . Diabetes Paternal Grandmother   . Heart disease Paternal Grandfather   . Hypertension Paternal Grandfather   . Cancer Maternal Aunt        Breast - In 68's  . Cancer Paternal Aunt        Breast - in 97's  . Suicidality Other     Social History   Socioeconomic History  . Marital status: Married    Spouse name: Clair Gulling  . Number of children: 3  . Years of education: college  . Highest education level: Not on file  Occupational History  . Occupation: N/A  Tobacco Use  . Smoking status: Never Smoker  . Smokeless tobacco: Never Used  . Tobacco comment: occ alcohol  Substance and Sexual Activity  . Alcohol use: Yes    Comment: socially  . Drug use: No  . Sexual activity: Not on file  Other Topics Concern  . Not on file  Social History Narrative   Denies caffeine use    Social Determinants of Health   Financial Resource Strain:   . Difficulty of Paying Living Expenses:   Food Insecurity:   . Worried About Charity fundraiser in the Last Year:   . Arboriculturist in the Last Year:   Transportation Needs:   . Film/video editor (Medical):   Marland Kitchen Lack of Transportation (Non-Medical):   Physical Activity:   . Days of Exercise per Week:   . Minutes of Exercise per Session:   Stress:   . Feeling of Stress :   Social Connections:   . Frequency of Communication with Friends and Family:   . Frequency of Social Gatherings with Friends and Family:   . Attends Religious Services:   . Active Member of Clubs or Organizations:   . Attends Archivist Meetings:   Marland Kitchen Marital Status:  Intimate Partner Violence:   . Fear of Current or Ex-Partner:   . Emotionally Abused:   Marland Kitchen Physically Abused:   . Sexually Abused:     Past Medical History, Surgical history, Social history, and Family history were reviewed and  updated as appropriate.   Please see review of systems for further details on the patient's review from today.   Objective:   Physical Exam:  Ht 5\' 1"  (1.549 m)   Wt 225 lb (102.1 kg)   BMI 42.51 kg/m   Physical Exam Neurological:     Mental Status: She is alert and oriented to person, place, and time.     Cranial Nerves: No dysarthria.  Psychiatric:        Attention and Perception: Attention and perception normal.        Mood and Affect: Mood normal.        Speech: Speech normal.        Behavior: Behavior is cooperative.        Thought Content: Thought content normal. Thought content is not paranoid or delusional. Thought content does not include homicidal or suicidal ideation. Thought content does not include homicidal or suicidal plan.        Cognition and Memory: Cognition and memory normal.        Judgment: Judgment normal.     Comments: Insight intact     Lab Review:     Component Value Date/Time   NA 140 01/19/2018 1556   K 4.4 01/19/2018 1556   CL 106 01/19/2018 1556   CO2 25 01/19/2018 1556   GLUCOSE 134 (H) 01/19/2018 1556   BUN 16 01/19/2018 1556   CREATININE 0.73 01/19/2018 1556   CALCIUM 8.9 01/19/2018 1556   PROT 6.8 01/19/2018 1556   ALBUMIN 3.6 01/19/2018 1556   AST 15 01/19/2018 1556   ALT 15 01/19/2018 1556   ALKPHOS 53 01/19/2018 1556   BILITOT 0.3 01/19/2018 1556   GFRNONAA >60 01/19/2018 1556   GFRAA >60 01/19/2018 1556       Component Value Date/Time   WBC 7.1 01/19/2018 1556   WBC 6.2 11/22/2013 1020   RBC 4.35 01/19/2018 1556   HGB 12.2 01/19/2018 1556   HCT 38.8 01/19/2018 1556   PLT 263 01/19/2018 1556   MCV 89.2 01/19/2018 1556   MCH 28.0 01/19/2018 1556   MCHC 31.4 01/19/2018 1556   RDW 13.5 01/19/2018 1556   LYMPHSABS 2.3 01/19/2018 1556   MONOABS 0.6 01/19/2018 1556   EOSABS 0.6 (H) 01/19/2018 1556   BASOSABS 0.1 01/19/2018 1556    No results found for: POCLITH, LITHIUM   No results found for: PHENYTOIN, PHENOBARB,  VALPROATE, CBMZ   .res Assessment: Plan:   Pt seen for 30 minutes and time spent answering pt's questions regarding medication and s/s. Will continue current plan of care since target signs and symptoms are well controlled without any tolerability issues. Pt to f/u in 3 months or sooner if clinically indicated. Recommend continuing psychotherapy with Beckey Downing, LPC. Patient advised to contact office with any questions, adverse effects, or acute worsening in signs and symptoms.  Aditri was seen today for follow-up.  Diagnoses and all orders for this visit:  Generalized anxiety disorder -     Vilazodone HCl (VIIBRYD) 10 MG TABS; Take 1/2-1 tab po qd  Depression, unspecified depression type -     Vilazodone HCl (VIIBRYD) 10 MG TABS; Take 1/2-1 tab po qd  Insomnia, unspecified type -     Lemborexant (DAYVIGO)  10 MG TABS; Take 10 mg by mouth at bedtime. Take 1/2-1 tab po QHS -     doxazosin (CARDURA) 4 MG tablet; Take 1 tablet (4 mg total) by mouth at bedtime.    Please see After Visit Summary for patient specific instructions.  Future Appointments  Date Time Provider [place]  07/08/2019  1:30 PM Thayer Headings, PMHNP [facility] None    No orders of the defined types were placed in this encounter.     -------------------------------

## 2019-05-20 ENCOUNTER — Other Ambulatory Visit: Payer: Self-pay

## 2019-05-20 ENCOUNTER — Ambulatory Visit: Payer: 59 | Admitting: Neurology

## 2019-05-20 ENCOUNTER — Encounter: Payer: Self-pay | Admitting: Neurology

## 2019-05-20 VITALS — BP 146/87 | HR 66 | Temp 97.5°F | Ht 61.0 in | Wt 220.0 lb

## 2019-05-20 DIAGNOSIS — Z9989 Dependence on other enabling machines and devices: Secondary | ICD-10-CM | POA: Diagnosis not present

## 2019-05-20 DIAGNOSIS — G4733 Obstructive sleep apnea (adult) (pediatric): Secondary | ICD-10-CM | POA: Diagnosis not present

## 2019-05-20 NOTE — Progress Notes (Signed)
Subjective:    Patient ID: [name] is a 59 y.o. female.  HPI     Interim history:   [name] is a 59 year old right-handed woman with an underlying medical history of hypertension, hypothyroidism, migraine headaches, allergic rhinitis, plantar fasciitis, cervical disc disease with myelopathy, status post neck surgery on 03/19/2013 under Dr. Ellene Moore, hyperlipidemia, obesity, and recent Dx of DCIS, s/p b/l mastectomies, Presents for follow-up consultation of her obstructive sleep apnea for her yearly checkup.  The patient is unaccompanied today.  I last saw her in a virtual visit on 04/23/2018, at which time she was stable on her CPAP.  She was advised to follow-up in 1 year.  Today, 05/20/2019: I reviewed her CPAP compliance data from 04/19/2019 through 05/18/2019, which is a total of 30 days, during which time she used her machine every night with percent used days greater than 4 hours at 100%, indicating superb compliance with an average usage of 9 hours and 38 minutes. Residual AHI low and at goal at 0.4/h, leak low with a 95th percentile at 0.7 L/min on a pressure of 9 cm with EPR of 1.  She reports doing well with her CPAP, fully compliant and no recent issues.  The patient's allergies, current medications, family history, past medical history, past social history, past surgical history and problem list were reviewed and updated as appropriate.    Previously (copied from previous notes for reference):     I saw her on 04/21/2017, at which time she was compliant with her CPAP. She reported doing well. She was supposed to have a cardiac CT. She was in the interim diagnosed with high-grade DCIS on the left side and underwent bilateral mastectomies in November 2019.   I reviewed her CPAP compliance data from 03/22/2018 through 04/20/2018 which is a total of 30 days, during which time she used her machine every night with percent used days greater than 4 hours at 100%, indicating superb  compliance with an average usage of 9 hours and 41 minutes which is rather high, residual AHI at goal at 0.4 per hour, leak on the low side with the 95th percentile at 5.4 L/m on a pressure of 9 cm with EPR of 3.    I first met her on 06/03/2013 at the request of her primary care physician, at which time she reported a prior diagnosis of OSA and she was using CPAP but her machine was defective and she needed reevaluation as she had not had a sleep study in over 8 years. She was compliant with her CPAP at the time. She was invited for sleep study testing. She had a baseline sleep study on 06/18/2016, which showed a sleep efficiency of 77.3%, sleep latency of 37 minutes. She had a decreased percentage of REM sleep at an increased percentage of stage II sleep. Overall AHI was 0.2 per hour, REM AHI was 3.3 per hour, supine AHI was 0 per hour. Average oxygen saturation was 98%, nadir was 93%. She had no significant PLMS. She was offered a home sleep test, which she had on 07/10/2016. AHI was 6.7 per hour, average oxygen saturation 94%, nadir of 78%. Based on the home sleep test results she was placed on CPAP therapy at home. She had a compliance follow-up appointment with [name], nurse practitioner on 10/23/2016 at which time she was doing well and compliant with CPAP therapy.   I reviewed her CPAP compliance data from 03/18/2017 through 04/16/2017 which is a total of 30  days, during which time she used her CPAP every night with percent used days greater than 4 hours at 100%, indicating superb compliance with an average usage of 8 hours and 35 minutes, residual AHI at goal at 0.2 per hour, leak acceptable with the 95th percentile at 10 L/m on a pressure of 9 cm with EPR of 3.    06/03/2016: (She) was previously diagnosed with obstructive sleep apnea and placed on CPAP therapy. She has a CPAP machine which she has been using, but some parts are defective, including the humidier and the power button and  therefore, the machine does not always work. She needs an updated machine. Sleep study testing was in or around 2010. Prior sleep study results are not available for my review today. CPAP compliance download was reviewed from 01/24/2016 through 04/22/2016, which is a total of 90 days, during which time she used her CPAP every night with percent used days greater than 4 hours at 100%, average usage of 9 hours and 5 minutes, residual AHI 1 per hour, pressure at 9 cm with EPR. I reviewed your office note from 05/01/2016, which you kindly included. Her DME company is [facility]. Her Epworth sleepiness score is 9 out of 24 today, fatigue score is 51 out of 63. Her bedtime is around MN to 1 AM and she has been taking ambien nearly nightly. She reports recent increase in stress in the past few months. She has had weight gain as well. Her wakeup time is usually between 9:30 and 10. She has nocturia once per average night. She has occasional morning headaches. For her migraines she [facility] and gets Botox injections. She reports a possible family history of obstructive sleep apnea in her father. She denies restless leg symptoms. She is a nonsmoker and drinks alcohol very occasionally, once every week or every 2 weeks. She does not drink caffeine daily. She does not work. She home schooled her 3 children. She lives at home with her husband and currently her middle daughter is back home. She has an older son and a younger son as well. She does not watch TV in her bedroom. They have a dog, not in their bedroom at night.   Her Past Medical History Is Significant For: Past Medical History:  Diagnosis Date  . Back pain of lumbar region with sciatica   . Cancer [facility])    breast  . Family history of anesthesia complication    grandfather died under anesthesia 5's- had heart issues  . H/O carpal tunnel repair 14  . Headache(784.0)   . Hypertension   . Hypothyroidism   . PONV  (postoperative nausea and vomiting)   . Sleep apnea    cpap since 10    Her Past Surgical History Is Significant For: Past Surgical History:  Procedure Laterality Date  . BILATERAL TOTAL MASTECTOMY WITH AXILLARY LYMPH NODE DISSECTION    . BREAST RECONSTRUCTION    . CERVICAL DISC ARTHROPLASTY N/A 03/19/2013   Procedure: CERVICAL FIVE TO SIX, CERVICAL SIX TO SEVEN CERVICAL ANTERIOR DISC ARTHROPLASTY;  Surgeon: Kristeen Miss, MD;  Location: [place] [facility];  Service: Neurosurgery;  Laterality: N/A;  C56 C67 artificial disc replacement  . CESAREAN SECTION    . COLONOSCOPY    . DILATATION & CURETTAGE/HYSTEROSCOPY WITH TRUECLEAR N/A 11/25/2013   Procedure: DILATATION & CURETTAGE/HYSTEROSCOPY WITH TRUCLEAR;  Surgeon: Margarette Asal, MD;  Location: [facility];  Service: Gynecology;  Laterality: N/A;  . ECTOPIC PREGNANCY  SURGERY    . GALLBLADDER SURGERY    . MOUTH SURGERY     child- dog bite  . TONSILLECTOMY    . TUBAL LIGATION      Her Family History Is Significant For: Family History  Problem Relation Age of Onset  . Hyperlipidemia Mother   . Heart disease Father   . Hypertension Father   . Diabetes Father   . Cancer Maternal Grandmother        breast cancer  . Diabetes Paternal Grandmother   . Heart disease Paternal Grandfather   . Hypertension Paternal Grandfather   . Cancer Maternal Aunt        Breast - In 38's  . Cancer Paternal Aunt        Breast - in 55's  . Suicidality Other     Her Social History Is Significant For: Social History   Socioeconomic History  . Marital status: Married    Spouse name: Clair Gulling  . Number of children: 3  . Years of education: college  . Highest education level: Not on file  Occupational History  . Occupation: N/A  Tobacco Use  . Smoking status: Never Smoker  . Smokeless tobacco: Never Used  . Tobacco comment: occ alcohol  Substance and Sexual Activity  . Alcohol use: Yes    Comment: socially  . Drug use: No  . Sexual activity: Not on file   Other Topics Concern  . Not on file  Social History Narrative   Denies caffeine use    Social Determinants of Health   Financial Resource Strain:   . Difficulty of Paying Living Expenses:   Food Insecurity:   . Worried About Charity fundraiser in the Last Year:   . Arboriculturist in the Last Year:   Transportation Needs:   . Film/video editor (Medical):   Marland Kitchen Lack of Transportation (Non-Medical):   Physical Activity:   . Days of Exercise per Week:   . Minutes of Exercise per Session:   Stress:   . Feeling of Stress :   Social Connections:   . Frequency of Communication with Friends and Family:   . Frequency of Social Gatherings with Friends and Family:   . Attends Religious Services:   . Active Member of Clubs or Organizations:   . Attends Archivist Meetings:   Marland Kitchen Marital Status:     Her Allergies Are:  Allergies  Allergen Reactions  . Effexor [Venlafaxine Hcl]     Weight loss and cant sleep  . Flexeril [Cyclobenzaprine] Hives  :   Her Current Medications Are:  Outpatient Encounter Medications as of 05/20/2019  Medication Sig  . ASTRAGALUS PO Take 250 mg by mouth daily.  . baclofen (LIORESAL) 20 MG tablet Take 20 mg by mouth daily as needed (MIGRAINES).   . CHELATED MAGNESIUM PO Take 300 mg by mouth daily. 2 TAB (150 MG EACH) DAILY  . Cholecalciferol (VITAMIN D3 PO) Take 5,000 Units by mouth every other day.  . diclofenac sodium (VOLTAREN) 1 % GEL Apply topically 4 (four) times daily.  Marland Kitchen doxazosin (CARDURA) 4 MG tablet Take 1 tablet (4 mg total) by mouth at bedtime.  Eduard Roux (AIMOVIG) 140 MG/ML SOAJ Inject into the skin.   Marland Kitchen GLUCOSAMINE-CHONDROITIN PO Take 1 tablet by mouth in the morning and at bedtime.   . hydrochlorothiazide (MICROZIDE) 12.5 MG capsule Take 12.5 mg by mouth daily.  Marland Kitchen HYDROcodone-ibuprofen (VICOPROFEN) 7.5-200 MG per tablet Take 1 tablet by mouth  every 8 (eight) hours as needed for moderate pain.  Marland Kitchen ibuprofen (ADVIL) 200 MG  tablet Take 1 tablet (200 mg total) by mouth every 6 (six) hours as needed. (Patient taking differently: Take 200 mg by mouth every 6 (six) hours as needed for mild pain or moderate pain. )  . Lemborexant (DAYVIGO) 10 MG TABS Take 10 mg by mouth at bedtime. Take 1/2-1 tab po QHS  . Lidocaine 0.5 % AERO Place 1 spray into both nostrils See admin instructions. CAN ONLY TAKE ONCE WEEKLY FOR MIGRAINES  . MELATONIN PO Take 5 mg by mouth at bedtime as needed (sleep).   . meloxicam (MOBIC) 7.5 MG tablet Take 7.5 mg by mouth at bedtime as needed for pain.  . methocarbamol (ROBAXIN) 500 MG tablet Take 500 mg by mouth at bedtime as needed for muscle spasms.   . Multiple Vitamins-Minerals (CENTRUM SILVER 50+WOMEN) TABS Take 1 tablet by mouth daily.  . naproxen sodium (ANAPROX) 220 MG tablet Take 220 mg by mouth daily as needed.   . Omega-3 Fatty Acids (FISH OIL PO) Take 1,280 mg by mouth in the morning and at bedtime.  . OnabotulinumtoxinA (BOTOX IM) Inject into the muscle every 3 (three) months. FOR MIGRAINES  . Psyllium Fiber 0.52 g CAPS Take 4 capsules by mouth every morning. 625 mg per cap  . rizatriptan (MAXALT-MLT) 10 MG disintegrating tablet Take 10 mg by mouth as needed for migraine.   . thyroid (ARMOUR THYROID) 30 MG tablet Take 30 mg by mouth daily before breakfast.   . tiZANidine (ZANAFLEX) 4 MG tablet Take 1 tablet by mouth daily as needed (MIGRAINES).   . TOPROL XL 100 MG 24 hr tablet Take 100 mg by mouth daily.   Marland Kitchen UNABLE TO FIND Take 1 tablet by mouth 2 (two) times daily. Med Name: Bonney Leitz calcium supplement  800-1235m daily (EACH IS 400 MG)  . UNABLE TO FIND Take 1 capsule by mouth daily. BETAGLUCAN (HERBAL) 400MG CAPSULE. 1 CAPSULE DAILY.  .Cristino MartesRoot 100 MG CAPS Take 1 capsule by mouth at bedtime as needed (SLEEP).   . Vilazodone HCl (VIIBRYD) 10 MG TABS Take 1/2-1 tab po qd  . [DISCONTINUED] Aspergillus Fumigatus 1:20 SOLN Inject into the skin.  . [DISCONTINUED] NON FORMULARY 70  mg every 30 (thirty) days. 727mmonthly injections for migraines  . [DISCONTINUED] OVER THE COUNTER MEDICATION   . [DISCONTINUED] OVER THE COUNTER MEDICATION Take 250 mg by mouth daily. ASTRAGALUS HERBAL. 1 CAPSULE DAILY  . [DISCONTINUED] Probiotic Product (PROBIOTIC PO) Take by mouth.   Facility-Administered Encounter Medications as of 05/20/2019  Medication  . ondansetron (ZOFRAN) 4 mg in sodium chloride 0.9 % 50 mL IVPB  :  Review of Systems:  Out of a complete 14 point review of systems, all are reviewed and negative with the exception of these symptoms as listed below: Review of Systems  Neurological:       Pt presents today and states that machine working well. DME [facility]. She does mention that sometimes the mask will leak and wake her up.     Objective:  Neurological Exam  Physical Exam Physical Examination:   Vitals:   05/20/19 1050  BP: (!) 146/87  Pulse: 66  Temp: (!) 97.5 F (36.4 C)    General Examination: The patient is a very pleasant [age].o. female in no acute distress. She appears well-developed and well-nourished and well groomed.   HEENT:Normocephalic, atraumatic, pupils are equal, round and reactive to light, tracking is good,  hearing is grossly intact. Face is symmetric with normal facial animation. Speech is clear with no dysarthria noted. There is no hypophonia. There is no lip, neck/head, jaw or voice tremor. Neck is supple with full range of passive and active motion. Oropharynx exam reveals: no new changes.   Chest:Clear to auscultation without wheezing, rhonchi or crackles noted.  Heart:S1+S2+0, regular and normal without murmurs, rubs or gallops noted.   Abdomen:Soft, non-tender and non-distended.  Extremities:There isnoobv. edema in the distal lower extremities bilaterally.   Skin: Warm and dry without trophic changes noted.  Musculoskeletal: exam reveals no obvious joint deformities, tenderness or joint swelling or erythema.    Neurologically:  Mental status: The patient is awake, alert and oriented in all 4 spheres.Herimmediate and remote memory, attention, language skills and fund of knowledge are appropriate. There is no evidence of aphasia, agnosia, apraxia or anomia. Speech is clear with normal prosody and enunciation. Thought process is linear. Mood is normaland affect is normal.  Cranial nerves II - XII are as described above under HEENT exam. In addition: shoulder shrug is normal with equal shoulder height noted. Motor exam: Normal bulk, strength and tone is noted. There is no tremor. Fine motor skills and coordination: grossly intact.  Cerebellar testing: No dysmetria or intention tremor. There is no truncal or gait ataxia.  Sensory exam: intact to light touch.  Gait, station and balance:Shestands easily. No veering to one side is noted. No leaning to one side is noted. Posture is age-appropriate and stance is narrow based. Gait showsnormalstride length and normalpace. No problems turning are noted.   Assessmentand Plan:  In summary,Alicia D Herringis a very pleasant 26 year oldfemalewith an underlying medical history of hypertension, hypothyroidism, migraine headaches, allergic rhinitis, plantar fasciitis, cervical disc disease with myelopathy, status post neck surgery on 03/19/2013 under Dr. Ellene Moore, hyperlipidemia, and obesity, who presents for follow-up consultation of her of her obstructive sleep apnea, well established on CPAP at 9 cm. She continues to be fully compliant with treatment and commended for this. She has ongoing good results. She is advised to FU yearly. I answered all her questions today and she was in agreement. I spent 20 minutes in total face-to-face time and in reviewing records during pre-charting, more than 50% of which was spent in counseling and coordination of care, reviewing test results, reviewing medications and treatment regimen and/or in discussing or reviewing the  diagnosis of OSA, the prognosis and treatment options. Pertinent laboratory and imaging test results that were available during this visit with the patient were reviewed by me and considered in my medical decision making (see chart for details).

## 2019-05-20 NOTE — Patient Instructions (Signed)
Please continue using your CPAP regularly. While your insurance requires that you use CPAP at least 4 hours each night on 70% of the nights, I recommend, that you not skip any nights and use it throughout the night if you can. Getting used to CPAP and staying with the treatment long term does take time and patience and discipline. Untreated obstructive sleep apnea when it is moderate to severe can have an adverse impact on cardiovascular health and raise her risk for heart disease, arrhythmias, hypertension, congestive heart failure, stroke and diabetes. Untreated obstructive sleep apnea causes sleep disruption, nonrestorative sleep, and sleep deprivation. This can have an impact on your day to day functioning and cause daytime sleepiness and impairment of cognitive function, memory loss, mood disturbance, and problems focussing. Using CPAP regularly can improve these symptoms.  Keep up the good work! I will see you back in one year! 

## 2019-07-08 ENCOUNTER — Other Ambulatory Visit: Payer: Self-pay

## 2019-07-08 ENCOUNTER — Encounter: Payer: Self-pay | Admitting: Psychiatry

## 2019-07-08 ENCOUNTER — Ambulatory Visit (INDEPENDENT_AMBULATORY_CARE_PROVIDER_SITE_OTHER): Payer: 59 | Admitting: Psychiatry

## 2019-07-08 DIAGNOSIS — F329 Major depressive disorder, single episode, unspecified: Secondary | ICD-10-CM | POA: Diagnosis not present

## 2019-07-08 DIAGNOSIS — F411 Generalized anxiety disorder: Secondary | ICD-10-CM

## 2019-07-08 DIAGNOSIS — G4700 Insomnia, unspecified: Secondary | ICD-10-CM

## 2019-07-08 DIAGNOSIS — F32A Depression, unspecified: Secondary | ICD-10-CM

## 2019-07-08 MED ORDER — DOXAZOSIN MESYLATE 4 MG PO TABS: 4.0000 mg | ORAL_TABLET | Freq: Every day | ORAL | 0 refills | Status: DC

## 2019-07-08 MED ORDER — VIIBRYD 10 MG PO TABS: ORAL_TABLET | ORAL | 1 refills | Status: DC

## 2019-07-08 MED ORDER — DAYVIGO 10 MG PO TABS: 10.0000 mg | ORAL_TABLET | Freq: Every day | ORAL | 0 refills | Status: DC

## 2019-07-08 NOTE — Progress Notes (Signed)
[name] [id number] 1960-05-05 59 y.o.  Subjective:   Patient ID:  [name] is a 59 y.o. (DOB [birth year]) female.  Chief Complaint:  Chief Complaint  Patient presents with  . Follow-up    Anxiety, Depression, and insomnia    HPI [name] presents to the office today for follow-up of anxiety, depression, and insomnia. She is still having construction, mold remediation, and painting at her house and questions if this triggers migraines. She reports that she is stressed with having people in her house.    She reports that she has had some anxiety with going into places after having limited social interaction during the pandemic. Has had some anxiety about money with multiple home repairs. She reports that she has had some worry about her daughter. She reports that she has not had any recent panic attacks. She reports that she will occasionally feel down in response to financial stress. Denies persistent depression. Had some brainspotting around her change in body image since breast cancer and mastectomy. She reports that she will have some sadness and low energy for about 3 days after brainspotting. She reports that energy and motivation are ok 1-3 days after brainspotting sessions. Denies persistent sadness. She notices some procrastination. Has been knitting some and enjoys this. Also doing yoga online. Has some anxiety about attending yoga in person. She reports that she has been trying to read some at night. Able to concentrate on reading. Denies SI.   She reports that she is taking Dayvigo 5-10 mg po QHS. She reports that sometimes she will take Dayvigo 5 mg and sleep 2-3 hours and awaken and take additional 5 mg po QHS. She reports that she continues to remember dreams. She reports that dreams are not "pleasant, but not totally terrifying." Appetite has been higher.   Daughter graduated [employer]. Daughter is living at home. Son also graduated. Recent beach trip with mother  and sister.   Continues to see Beckey Downing, [facility].   Past Psychiatric Medication Trials: ambien CR Ambien- Has been taking 5-10 mg Intermezzo- Helpful for sleep initiation Xanax Effexor XR-Multiple adverse effects. Severe discontinuation s/s.  Zonisamide- SI.  Lexapro-Concentration difficulty Viibryd Elavil- Apatheric, difficulty with work Topamax- Anger, irritability Benadryl- partially effective. Dayvigo- Effective. Has mild drowsiness the following day.  Review of Systems:  Review of Systems  Musculoskeletal: Positive for arthralgias and back pain. Negative for gait problem.       Knee pain  Neurological:       Has had recent worsening migraines recently. She reports that in May she had been one year without a period.   Psychiatric/Behavioral:       Please refer to HPI    Medications: I have reviewed the patient's current medications.  Current Outpatient Medications  Medication Sig Dispense Refill  . ASTRAGALUS PO Take 250 mg by mouth daily.    . baclofen (LIORESAL) 20 MG tablet Take 20 mg by mouth daily as needed (MIGRAINES).     . CHELATED MAGNESIUM PO Take 300 mg by mouth daily. 2 TAB (150 MG EACH) DAILY    . Cholecalciferol (VITAMIN D3 PO) Take 5,000 Units by mouth every other day.    . diclofenac sodium (VOLTAREN) 1 % GEL Apply topically 4 (four) times daily.    Marland Kitchen doxazosin (CARDURA) 4 MG tablet Take 1 tablet (4 mg total) by mouth at bedtime. 90 tablet 0  . Erenumab-aooe (AIMOVIG) 140 MG/ML SOAJ Inject into the skin.     Marland Kitchen  GLUCOSAMINE-CHONDROITIN PO Take 1 tablet by mouth in the morning and at bedtime.     . hydrochlorothiazide (MICROZIDE) 12.5 MG capsule Take 12.5 mg by mouth daily.    Marland Kitchen HYDROcodone-ibuprofen (VICOPROFEN) 7.5-200 MG per tablet Take 1 tablet by mouth every 8 (eight) hours as needed for moderate pain. 30 tablet 0  . ibuprofen (ADVIL) 200 MG tablet Take 1 tablet (200 mg total) by mouth every 6 (six) hours as needed. (Patient taking differently: Take  200 mg by mouth every 6 (six) hours as needed for mild pain or moderate pain. ) 30 tablet 0  . Lemborexant (DAYVIGO) 10 MG TABS Take 10 mg by mouth at bedtime. 90 tablet 0  . Lidocaine 0.5 % AERO Place 1 spray into both nostrils See admin instructions. CAN ONLY TAKE ONCE WEEKLY FOR MIGRAINES    . MELATONIN PO Take 5 mg by mouth at bedtime as needed (sleep).     . meloxicam (MOBIC) 7.5 MG tablet Take 7.5 mg by mouth at bedtime as needed for pain.    . methocarbamol (ROBAXIN) 500 MG tablet Take 500 mg by mouth at bedtime as needed for muscle spasms.     . Multiple Vitamins-Minerals (CENTRUM SILVER 50+WOMEN) TABS Take 1 tablet by mouth daily.    . naproxen sodium (ANAPROX) 220 MG tablet Take 220 mg by mouth daily as needed.     . Omega-3 Fatty Acids (FISH OIL PO) Take 1,280 mg by mouth in the morning and at bedtime.    . OnabotulinumtoxinA (BOTOX IM) Inject into the muscle every 3 (three) months. FOR MIGRAINES    . Psyllium Fiber 0.52 g CAPS Take 4 capsules by mouth every morning. 625 mg per cap    . rizatriptan (MAXALT-MLT) 10 MG disintegrating tablet Take 10 mg by mouth as needed for migraine.     . thyroid (ARMOUR THYROID) 30 MG tablet Take 30 mg by mouth daily before breakfast.     . tiZANidine (ZANAFLEX) 4 MG tablet Take 1 tablet by mouth daily as needed (MIGRAINES).     . TOPROL XL 100 MG 24 hr tablet Take 100 mg by mouth daily.     Marland Kitchen UNABLE TO FIND Take 1 tablet by mouth 2 (two) times daily. Med Name: Bonney Leitz calcium supplement  800-1200mg  daily (EACH IS 400 MG)    . UNABLE TO FIND Take 1 capsule by mouth daily. BETAGLUCAN (HERBAL) 400MG  CAPSULE. 1 CAPSULE DAILY.    Cristino Martes Root 100 MG CAPS Take 1 capsule by mouth at bedtime as needed (SLEEP).     . Vilazodone HCl (VIIBRYD) 10 MG TABS Take 1/2-1 tab po qd 90 tablet 1   No current facility-administered medications for this visit.   Facility-Administered Medications Ordered in Other Visits  Medication Dose Route Frequency Provider  Last Rate Last Admin  . ondansetron (ZOFRAN) 4 mg in sodium chloride 0.9 % 50 mL IVPB  4 mg Intravenous Q6H PRN Kristeen Miss, MD        Medication Side Effects: None  Allergies:  Allergies  Allergen Reactions  . Effexor [Venlafaxine Hcl]     Weight loss and cant sleep  . Flexeril [Cyclobenzaprine] Hives    Past Medical History:  Diagnosis Date  . Back pain of lumbar region with sciatica   . Cancer [facility])    breast  . Family history of anesthesia complication    grandfather died under anesthesia 32's- had heart issues  . H/O carpal tunnel repair 14  . Headache(784.0)   .  Hypertension   . Hypothyroidism   . PONV (postoperative nausea and vomiting)   . Sleep apnea    cpap since 10    Family History  Problem Relation Age of Onset  . Hyperlipidemia Mother   . Heart disease Father   . Hypertension Father   . Diabetes Father   . Cancer Maternal Grandmother        breast cancer  . Diabetes Paternal Grandmother   . Heart disease Paternal Grandfather   . Hypertension Paternal Grandfather   . Cancer Maternal Aunt        Breast - In 64's  . Cancer Paternal Aunt        Breast - in 76's  . Suicidality Other     Social History   Socioeconomic History  . Marital status: Married    Spouse name: Clair Gulling  . Number of children: 3  . Years of education: college  . Highest education level: Not on file  Occupational History  . Occupation: N/A  Tobacco Use  . Smoking status: Never Smoker  . Smokeless tobacco: Never Used  . Tobacco comment: occ alcohol  Substance and Sexual Activity  . Alcohol use: Yes    Comment: socially  . Drug use: No  . Sexual activity: Not on file  Other Topics Concern  . Not on file  Social History Narrative   Denies caffeine use    Social Determinants of Health   Financial Resource Strain:   . Difficulty of Paying Living Expenses:   Food Insecurity:   . Worried About Charity fundraiser in the Last Year:   . Arboriculturist in the Last Year:    Transportation Needs:   . Film/video editor (Medical):   Marland Kitchen Lack of Transportation (Non-Medical):   Physical Activity:   . Days of Exercise per Week:   . Minutes of Exercise per Session:   Stress:   . Feeling of Stress :   Social Connections:   . Frequency of Communication with Friends and Family:   . Frequency of Social Gatherings with Friends and Family:   . Attends Religious Services:   . Active Member of Clubs or Organizations:   . Attends Archivist Meetings:   Marland Kitchen Marital Status:   Intimate Partner Violence:   . Fear of Current or Ex-Partner:   . Emotionally Abused:   Marland Kitchen Physically Abused:   . Sexually Abused:     Past Medical History, Surgical history, Social history, and Family history were reviewed and updated as appropriate.   Please see review of systems for further details on the patient's review from today.   Objective:   Physical Exam:  BP 129/67   Pulse 75   Physical Exam Constitutional:      General: She is not in acute distress. Musculoskeletal:        General: No deformity.  Neurological:     Mental Status: She is alert and oriented to person, place, and time.     Coordination: Coordination normal.  Psychiatric:        Attention and Perception: Attention and perception normal. She does not perceive auditory or visual hallucinations.        Mood and Affect: Mood normal. Mood is not anxious or depressed. Affect is not labile, blunt, angry or inappropriate.        Speech: Speech normal.        Behavior: Behavior normal.        Thought Content: Thought  content normal. Thought content is not paranoid or delusional. Thought content does not include homicidal or suicidal ideation. Thought content does not include homicidal or suicidal plan.        Cognition and Memory: Cognition and memory normal.        Judgment: Judgment normal.     Comments: Insight intact     Lab Review:     Component Value Date/Time   NA 140 01/19/2018 1556   K 4.4  01/19/2018 1556   CL 106 01/19/2018 1556   CO2 25 01/19/2018 1556   GLUCOSE 134 (H) 01/19/2018 1556   BUN 16 01/19/2018 1556   CREATININE 0.73 01/19/2018 1556   CALCIUM 8.9 01/19/2018 1556   PROT 6.8 01/19/2018 1556   ALBUMIN 3.6 01/19/2018 1556   AST 15 01/19/2018 1556   ALT 15 01/19/2018 1556   ALKPHOS 53 01/19/2018 1556   BILITOT 0.3 01/19/2018 1556   GFRNONAA >60 01/19/2018 1556   GFRAA >60 01/19/2018 1556       Component Value Date/Time   WBC 7.1 01/19/2018 1556   WBC 6.2 11/22/2013 1020   RBC 4.35 01/19/2018 1556   HGB 12.2 01/19/2018 1556   HCT 38.8 01/19/2018 1556   PLT 263 01/19/2018 1556   MCV 89.2 01/19/2018 1556   MCH 28.0 01/19/2018 1556   MCHC 31.4 01/19/2018 1556   RDW 13.5 01/19/2018 1556   LYMPHSABS 2.3 01/19/2018 1556   MONOABS 0.6 01/19/2018 1556   EOSABS 0.6 (H) 01/19/2018 1556   BASOSABS 0.1 01/19/2018 1556    No results found for: POCLITH, LITHIUM   No results found for: PHENYTOIN, PHENOBARB, VALPROATE, CBMZ   .res Assessment: Plan:   Pt seen for 30 minutes and time spent counseling pt re: current and long-term treatment plan. Discussed that she can take Dayvigo 10 mg po QHS for insomnia and that this may prevent early awakenings and lead to more continuous sleep.  Continue Viibryd 5 mg po qd for anxiety and depression. Discussed notifying provider if her mood worsens or if she notices persistent sad mood and low energy aside from only 1-3 days following brainspotting.  Continue Doxazosin 4 mg po QHS for nightmares. Discussed possible trial of decreasing or stopping Doxazosin once she has completed trauma work.  Recommend continuing therapy with Beckey Downing, [facility] and brainspotting. Pt to f/u in 3 months or sooner if clinically indicated.  Patient advised to contact office with any questions, adverse effects, or acute worsening in signs and symptoms.  Shantera was seen today for follow-up.  Diagnoses and all orders for this visit:  Generalized  anxiety disorder -     Vilazodone HCl (VIIBRYD) 10 MG TABS; Take 1/2-1 tab po qd  Depression, unspecified depression type -     Vilazodone HCl (VIIBRYD) 10 MG TABS; Take 1/2-1 tab po qd  Insomnia, unspecified type -     Lemborexant (DAYVIGO) 10 MG TABS; Take 10 mg by mouth at bedtime. -     doxazosin (CARDURA) 4 MG tablet; Take 1 tablet (4 mg total) by mouth at bedtime.     Please see After Visit Summary for patient specific instructions.  Future Appointments  Date Time Provider [place]  05/22/2020  1:00 PM Star Age, MD [facility] None    No orders of the defined types were placed in this encounter.   -------------------------------

## 2019-08-16 ENCOUNTER — Telehealth: Payer: Self-pay | Admitting: Psychiatry

## 2019-08-16 NOTE — Telephone Encounter (Signed)
Pt called to report depression  Gotten worse and crying a lot. Started around first of July and increased since. Please advise if need sooner apt 9/13 or adjustments. Call [phone]

## 2019-08-17 NOTE — Telephone Encounter (Signed)
Patient called back and given her instructions to increase her Viibryd to 10 mg. She did say she's been having vivid dreams this whole time and was started on doxazosin for these. Advised her if the dreams worsen to let us know. She verbalized understanding of taking with a meal.  Instructed her to call back if symptoms aren't improving or worsening. She agreed and was appreciative of call back.

## 2019-08-17 NOTE — Telephone Encounter (Signed)
LM to call back with information

## 2019-10-04 ENCOUNTER — Ambulatory Visit (INDEPENDENT_AMBULATORY_CARE_PROVIDER_SITE_OTHER): Payer: 59 | Admitting: Psychiatry

## 2019-10-04 ENCOUNTER — Encounter: Payer: Self-pay | Admitting: Psychiatry

## 2019-10-04 ENCOUNTER — Other Ambulatory Visit: Payer: Self-pay

## 2019-10-04 DIAGNOSIS — F411 Generalized anxiety disorder: Secondary | ICD-10-CM | POA: Diagnosis not present

## 2019-10-04 DIAGNOSIS — G4700 Insomnia, unspecified: Secondary | ICD-10-CM | POA: Diagnosis not present

## 2019-10-04 DIAGNOSIS — F32A Depression, unspecified: Secondary | ICD-10-CM

## 2019-10-04 DIAGNOSIS — F329 Major depressive disorder, single episode, unspecified: Secondary | ICD-10-CM | POA: Diagnosis not present

## 2019-10-04 MED ORDER — DOXAZOSIN MESYLATE 4 MG PO TABS: 4.0000 mg | ORAL_TABLET | Freq: Every day | ORAL | 0 refills | Status: DC

## 2019-10-04 MED ORDER — DAYVIGO 10 MG PO TABS: 10.0000 mg | ORAL_TABLET | Freq: Every day | ORAL | 0 refills | Status: DC

## 2019-10-04 MED ORDER — VIIBRYD 10 MG PO TABS: ORAL_TABLET | ORAL | 1 refills | Status: DC

## 2019-10-04 NOTE — Progress Notes (Signed)
[name] [id number] 08-24-1960 59 y.o.  Subjective:   Patient ID:  [name] is a 59 y.o. (DOB 1960-05-01) female.  Chief Complaint:  Chief Complaint  Patient presents with  . Anxiety  . Insomnia  . Follow-up    Depression    HPI [name] presents to the office today for follow-up of anxiety, depression, and insomnia.  She reports that she increased Viibryd to 10 mg po qd and Dayvigo to 10 mg po qd after calling office. She reports that she was having worsening anxiety, depression, and tearfulness. She reports that she was also having disturbed sleep which then triggered migraines.   She reports that if she forgets medication she is more tearful and hopeless. She reports that she has been doing some intense therapy work. She reports that she has had sadness in response to grieving that some family members will not be able to be what she has hoped. Had a panic attack recently after a trigger. She reports that her sleep is ok now. She falls asleep within 30-45 minutes and then wakes up 1-2 times to go to the bathroom and is usually able to return able to to sleep without difficulty. She reports that she has been having some nightmares. Has some stress dreams. She reports that her appetite has been good and has been eating more. Concentration has been ok. She is looking forward to getting her home completed. Denies SI.   Daughter has been having some difficulty with anxiety. Has been seeing Beckey Downing, [facility] for therapy and has also been seeing a therapist for brainspotting.   Dog is having health issues.    Past Psychiatric Medication Trials: ambien CR Ambien- Has been taking 5-10 mg Intermezzo- Helpful for sleep initiation Xanax Effexor XR-Multiple adverse effects. Severe discontinuation s/s.  Zonisamide- SI.  Lexapro-Concentration difficulty Viibryd Elavil- Apatheric, difficulty with work Topamax- Anger, irritability Benadryl- partially effective. Dayvigo-  Effective. Has mild drowsiness the following day.  Review of Systems:  Review of Systems  Musculoskeletal: Negative for gait problem.       Pain in feet  Neurological: Positive for headaches. Negative for tremors.       Increased migraines.   Psychiatric/Behavioral:       Please refer to HPI    Medications: I have reviewed the patient's current medications.  Current Outpatient Medications  Medication Sig Dispense Refill  . rizatriptan (MAXALT-MLT) 10 MG disintegrating tablet Take 10 mg by mouth as needed for migraine.     . Vilazodone HCl (VIIBRYD) 10 MG TABS Take 1/2-1 tab po qd 90 tablet 1  . ASTRAGALUS PO Take 250 mg by mouth daily.    . baclofen (LIORESAL) 20 MG tablet Take 20 mg by mouth daily as needed (MIGRAINES).     . CHELATED MAGNESIUM PO Take 300 mg by mouth daily. 2 TAB (150 MG EACH) DAILY    . Cholecalciferol (VITAMIN D3 PO) Take 5,000 Units by mouth every other day.    . diclofenac sodium (VOLTAREN) 1 % GEL Apply topically 4 (four) times daily.    Marland Kitchen doxazosin (CARDURA) 4 MG tablet Take 1 tablet (4 mg total) by mouth at bedtime. 90 tablet 0  . Erenumab-aooe (AIMOVIG) 140 MG/ML SOAJ Inject into the skin.     Marland Kitchen GLUCOSAMINE-CHONDROITIN PO Take 1 tablet by mouth in the morning and at bedtime.     . hydrochlorothiazide (MICROZIDE) 12.5 MG capsule Take 12.5 mg by mouth daily.    Marland Kitchen HYDROcodone-ibuprofen (VICOPROFEN) 7.5-200  MG per tablet Take 1 tablet by mouth every 8 (eight) hours as needed for moderate pain. 30 tablet 0  . ibuprofen (ADVIL) 200 MG tablet Take 1 tablet (200 mg total) by mouth every 6 (six) hours as needed. (Patient taking differently: Take 200 mg by mouth every 6 (six) hours as needed for mild pain or moderate pain. ) 30 tablet 0  . Lemborexant (DAYVIGO) 10 MG TABS Take 10 mg by mouth at bedtime. 90 tablet 0  . Lidocaine 0.5 % AERO Place 1 spray into both nostrils See admin instructions. CAN ONLY TAKE ONCE WEEKLY FOR MIGRAINES    . MELATONIN PO Take 5 mg by  mouth at bedtime as needed (sleep).     . meloxicam (MOBIC) 7.5 MG tablet Take 7.5 mg by mouth at bedtime as needed for pain.    . methocarbamol (ROBAXIN) 500 MG tablet Take 500 mg by mouth at bedtime as needed for muscle spasms.     . Multiple Vitamins-Minerals (CENTRUM SILVER 50+WOMEN) TABS Take 1 tablet by mouth daily.    . naproxen sodium (ANAPROX) 220 MG tablet Take 220 mg by mouth daily as needed.     . Omega-3 Fatty Acids (FISH OIL PO) Take 1,280 mg by mouth in the morning and at bedtime.    . OnabotulinumtoxinA (BOTOX IM) Inject into the muscle every 3 (three) months. FOR MIGRAINES    . Psyllium Fiber 0.52 g CAPS Take 4 capsules by mouth every morning. 625 mg per cap    . thyroid (ARMOUR THYROID) 30 MG tablet Take 30 mg by mouth daily before breakfast.     . tiZANidine (ZANAFLEX) 4 MG tablet Take 1 tablet by mouth daily as needed (MIGRAINES).     . TOPROL XL 100 MG 24 hr tablet Take 100 mg by mouth daily.     Marland Kitchen UNABLE TO FIND Take 1 tablet by mouth 2 (two) times daily. Med Name: [name] calcium supplement  800-1200mg  daily (EACH IS 400 MG)    . UNABLE TO FIND Take 1 capsule by mouth daily. BETAGLUCAN (HERBAL) 400MG  CAPSULE. 1 CAPSULE DAILY.    [name] Root 100 MG CAPS Take 1 capsule by mouth at bedtime as needed (SLEEP).      No current facility-administered medications for this visit.   Facility-Administered Medications Ordered in Other Visits  Medication Dose Route Frequency Provider Last Rate Last Admin  . ondansetron (ZOFRAN) 4 mg in sodium chloride 0.9 % 50 mL IVPB  4 mg Intravenous Q6H PRN Kristeen Miss, MD        Medication Side Effects: Other: Possible vivid dreams/nightmares  Allergies:  Allergies  Allergen Reactions  . Effexor [Venlafaxine Hcl]     Weight loss and cant sleep  . Flexeril [Cyclobenzaprine] Hives    Past Medical History:  Diagnosis Date  . Back pain of lumbar region with sciatica   . Cancer [facility])    breast  . Family history of anesthesia  complication    grandfather died under anesthesia 71's- had heart issues  . H/O carpal tunnel repair 14  . Headache(784.0)   . Hypertension   . Hypothyroidism   . PONV (postoperative nausea and vomiting)   . Sleep apnea    cpap since 10    Family History  Problem Relation Age of Onset  . Hyperlipidemia Mother   . Heart disease Father   . Hypertension Father   . Diabetes Father   . Cancer Maternal Grandmother  breast cancer  . Diabetes Paternal Grandmother   . Heart disease Paternal Grandfather   . Hypertension Paternal Grandfather   . Cancer Maternal Aunt        Breast - In 31's  . Cancer Paternal Aunt        Breast - in 40's  . Suicidality Other     Social History   Socioeconomic History  . Marital status: Married    Spouse name: Clair Gulling  . Number of children: 3  . Years of education: college  . Highest education level: Not on file  Occupational History  . Occupation: N/A  Tobacco Use  . Smoking status: Never Smoker  . Smokeless tobacco: Never Used  . Tobacco comment: occ alcohol  Substance and Sexual Activity  . Alcohol use: Yes    Comment: socially  . Drug use: No  . Sexual activity: Not on file  Other Topics Concern  . Not on file  Social History Narrative   Denies caffeine use    Social Determinants of Health   Financial Resource Strain:   . Difficulty of Paying Living Expenses: Not on file  Food Insecurity:   . Worried About Charity fundraiser in the Last Year: Not on file  . Ran Out of Food in the Last Year: Not on file  Transportation Needs:   . Lack of Transportation (Medical): Not on file  . Lack of Transportation (Non-Medical): Not on file  Physical Activity:   . Days of Exercise per Week: Not on file  . Minutes of Exercise per Session: Not on file  Stress:   . Feeling of Stress : Not on file  Social Connections:   . Frequency of Communication with Friends and Family: Not on file  . Frequency of Social Gatherings with Friends and  Family: Not on file  . Attends Religious Services: Not on file  . Active Member of Clubs or Organizations: Not on file  . Attends Archivist Meetings: Not on file  . Marital Status: Not on file  Intimate Partner Violence:   . Fear of Current or Ex-Partner: Not on file  . Emotionally Abused: Not on file  . Physically Abused: Not on file  . Sexually Abused: Not on file    Past Medical History, Surgical history, Social history, and Family history were reviewed and updated as appropriate.   Please see review of systems for further details on the patient's review from today.   Objective:   Physical Exam:  BP (!) 144/89   Pulse 91   Physical Exam Constitutional:      General: She is not in acute distress. Musculoskeletal:        General: No deformity.  Neurological:     Mental Status: She is alert and oriented to person, place, and time.     Coordination: Coordination normal.  Psychiatric:        Attention and Perception: Attention and perception normal. She does not perceive auditory or visual hallucinations.        Mood and Affect: Mood is anxious. Mood is not depressed. Affect is not labile, blunt, angry or inappropriate.        Speech: Speech normal.        Behavior: Behavior normal.        Thought Content: Thought content normal. Thought content is not paranoid or delusional. Thought content does not include homicidal or suicidal ideation. Thought content does not include homicidal or suicidal plan.  Cognition and Memory: Cognition and memory normal.        Judgment: Judgment normal.     Comments: Insight intact     Lab Review:     Component Value Date/Time   NA 140 01/19/2018 1556   K 4.4 01/19/2018 1556   CL 106 01/19/2018 1556   CO2 25 01/19/2018 1556   GLUCOSE 134 (H) 01/19/2018 1556   BUN 16 01/19/2018 1556   CREATININE 0.73 01/19/2018 1556   CALCIUM 8.9 01/19/2018 1556   PROT 6.8 01/19/2018 1556   ALBUMIN 3.6 01/19/2018 1556   AST 15  01/19/2018 1556   ALT 15 01/19/2018 1556   ALKPHOS 53 01/19/2018 1556   BILITOT 0.3 01/19/2018 1556   GFRNONAA >60 01/19/2018 1556   GFRAA >60 01/19/2018 1556       Component Value Date/Time   WBC 7.1 01/19/2018 1556   WBC 6.2 11/22/2013 1020   RBC 4.35 01/19/2018 1556   HGB 12.2 01/19/2018 1556   HCT 38.8 01/19/2018 1556   PLT 263 01/19/2018 1556   MCV 89.2 01/19/2018 1556   MCH 28.0 01/19/2018 1556   MCHC 31.4 01/19/2018 1556   RDW 13.5 01/19/2018 1556   LYMPHSABS 2.3 01/19/2018 1556   MONOABS 0.6 01/19/2018 1556   EOSABS 0.6 (H) 01/19/2018 1556   BASOSABS 0.1 01/19/2018 1556    No results found for: POCLITH, LITHIUM   No results found for: PHENYTOIN, PHENOBARB, VALPROATE, CBMZ   .res Assessment: Plan:   Will continue current medications since patient reports improved mood and anxiety with increase in Viibryd to 10 mg daily.  She reports some continued anxiety signs and symptoms which she attributes to doing significant trauma work. Continue Dayvigo 10 mg at bedtime since this has been helpful for her insomnia. Continue doxazosin 4 mg at bedtime for nightmares. Recommend continuing psychotherapy with Beckey Downing and brain spotting. Patient follow-up with this provider in 3 months or sooner if clinically indicated. Patient advised to contact office with any questions, adverse effects, or acute worsening in signs and symptoms.  Kyesha was seen today for anxiety, insomnia and follow-up.  Diagnoses and all orders for this visit:  Insomnia, unspecified type -     doxazosin (CARDURA) 4 MG tablet; Take 1 tablet (4 mg total) by mouth at bedtime. -     Lemborexant (DAYVIGO) 10 MG TABS; Take 10 mg by mouth at bedtime.  Generalized anxiety disorder -     Vilazodone HCl (VIIBRYD) 10 MG TABS; Take 1/2-1 tab po qd  Depression, unspecified depression type -     Vilazodone HCl (VIIBRYD) 10 MG TABS; Take 1/2-1 tab po qd     Please see After Visit Summary for patient specific  instructions.  Future Appointments  Date Time Provider [place]  01/03/2020  2:15 PM Thayer Headings, PMHNP [facility] None  05/22/2020  1:00 PM Star Age, MD [facility] None    No orders of the defined types were placed in this encounter.   -------------------------------

## 2019-10-08 ENCOUNTER — Ambulatory Visit: Payer: 59 | Admitting: Psychiatry

## 2019-11-26 ENCOUNTER — Other Ambulatory Visit: Payer: Self-pay | Admitting: Registered Nurse

## 2019-11-26 DIAGNOSIS — R600 Localized edema: Secondary | ICD-10-CM

## 2019-11-26 DIAGNOSIS — I10 Essential (primary) hypertension: Secondary | ICD-10-CM

## 2019-11-30 ENCOUNTER — Ambulatory Visit: Payer: 59

## 2019-11-30 ENCOUNTER — Other Ambulatory Visit: Payer: Self-pay

## 2019-11-30 DIAGNOSIS — R600 Localized edema: Secondary | ICD-10-CM

## 2019-11-30 DIAGNOSIS — I10 Essential (primary) hypertension: Secondary | ICD-10-CM

## 2019-12-09 IMAGING — MR MR BILATERAL BREAST WITHOUT AND WITH CONTRAST
8 of 11 series · 32 of 48 positions shown · IV contrast (19ml multihance)
Comparison: Previous exam(s).

CLINICAL DATA: 56-year-old female with strong family history of
breast cancer and lifetime risk of developing breast cancer
calculated at 28%. Benign stereotactic guided biopsy of left breast
calcifications in 6924 with pathology revealing fibrocystic change.

LABS:  Creatinine was obtained on site at [HOSPITAL] at [REDACTED] [HOSPITAL].
Results: Creatinine 0.7 mg/dL.
EXAM:
BILATERAL BREAST MRI WITH AND WITHOUT CONTRAST
TECHNIQUE: Multiplanar, multisequence MR images of both breasts were obtained
prior to and following the intravenous administration of 19 ml of
MultiHance.
Three-dimensional MR images were rendered by post-processing the
original MR data using the DynaCAD thin client. The 3D MR images are
interpreted and the findings are included in the complete MRI report
below.

[Series 3: fl3d pre-cm no · axial · non-contrast · 1.2mm · 0.99mm/px · z∈[-33,+139]mm · 5 of 144 slices shown]
[im 1/144]
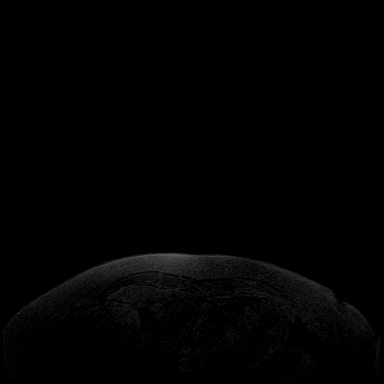
[im 36/144]
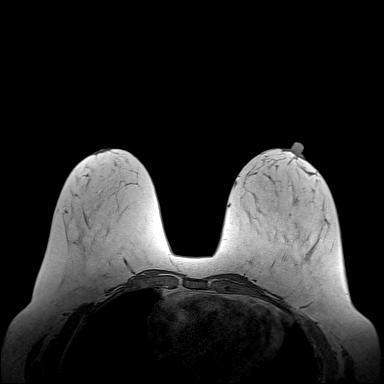
[im 72/144]
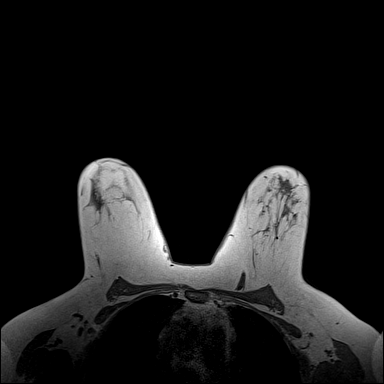
[im 108/144]
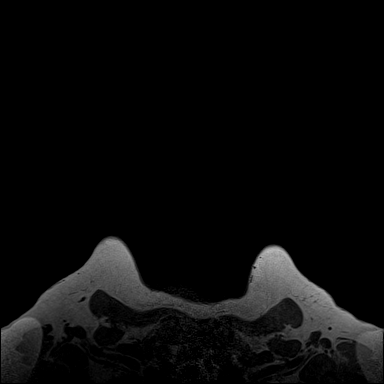
[im 144/144]
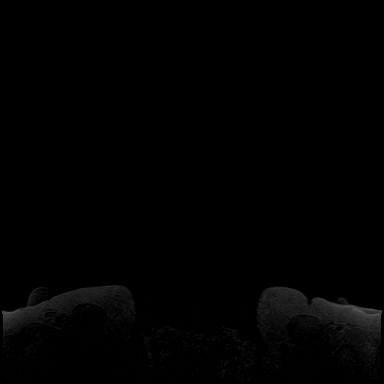

[Series 4: t2_tirm_tra ipat (a-p) · axial · 3.0mm · 0.74mm/px · 1 of 58 slices shown]
[im 1/58]
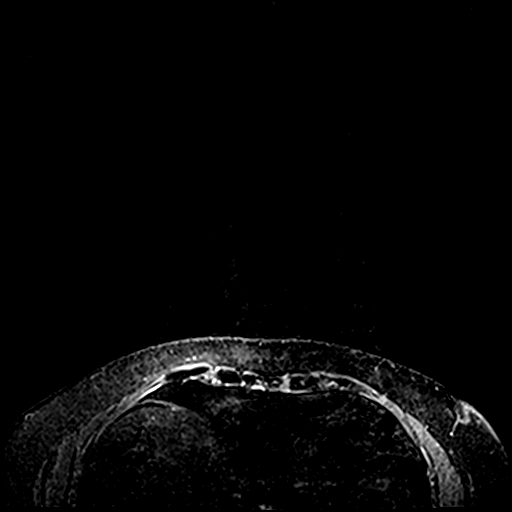

[Series 5: fl3d pre-cm · axial · non-contrast · 1.2mm · 0.99mm/px · z∈[-33,+139]mm · 5 of 144 slices shown]
[im 1/144]
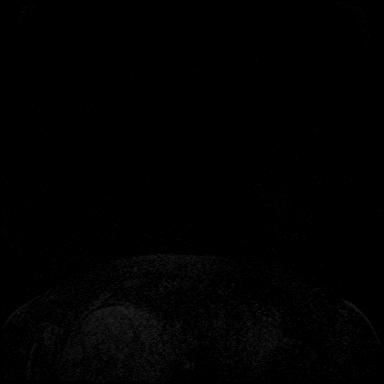
[im 36/144]
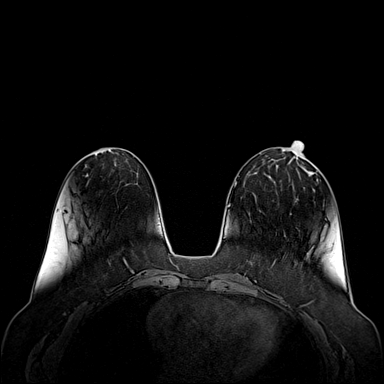
[im 72/144]
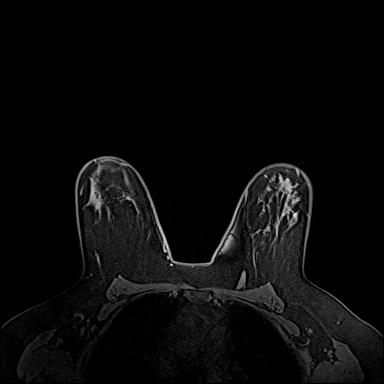
[im 108/144]
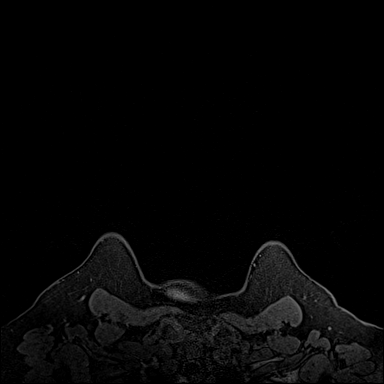
[im 144/144]
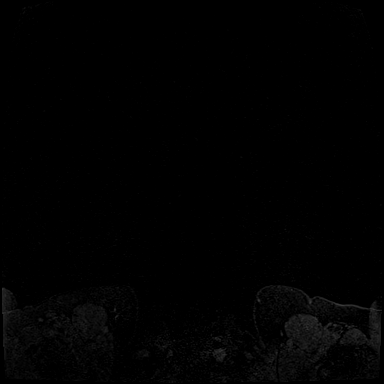

[Series 6: fl3d post immediate · axial · 1.2mm · 0.99mm/px · z∈[-33,+139]mm · 5 of 144 slices shown (1 of 3)]
[im 1/144]
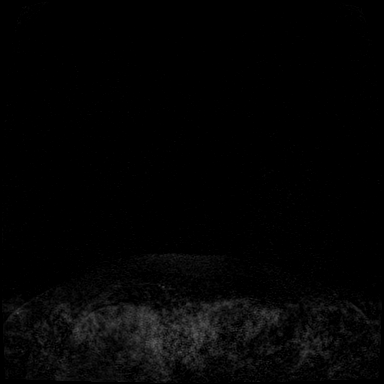
[im 36/144]
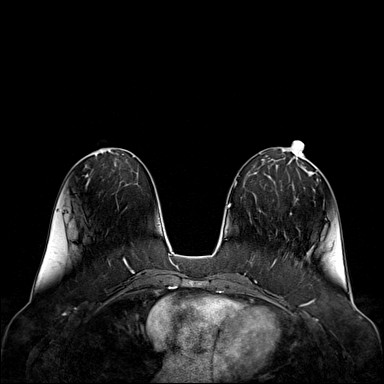
[im 72/144]
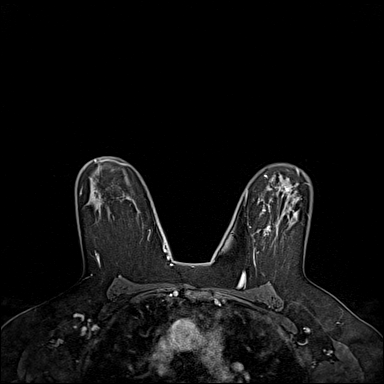
[im 108/144]
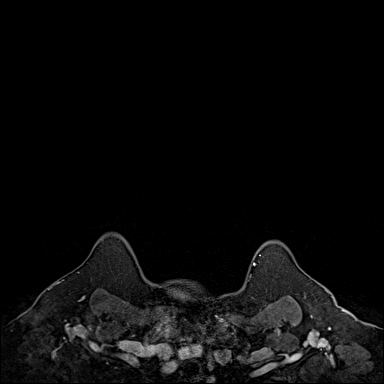
[im 144/144]
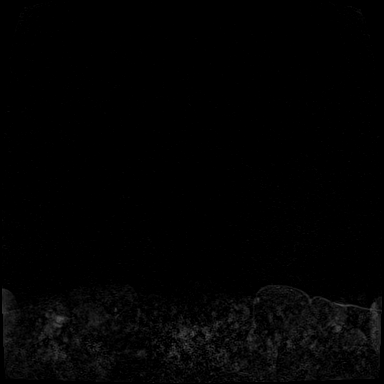

[Series 7: fl3d post immediate · axial · 1.2mm · 0.99mm/px · z∈[-33,+139]mm · 6 of 144 slices shown (2 of 3)]
[im 1/144]
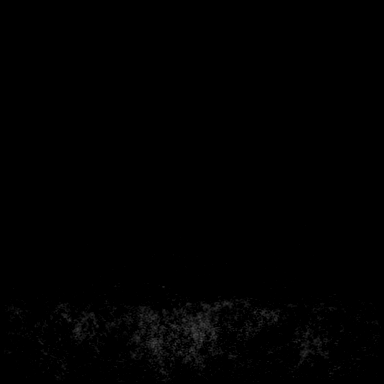
[im 29/144]
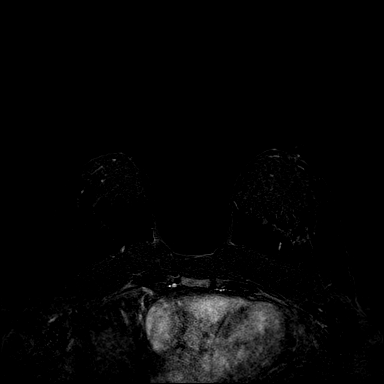
[im 58/144]
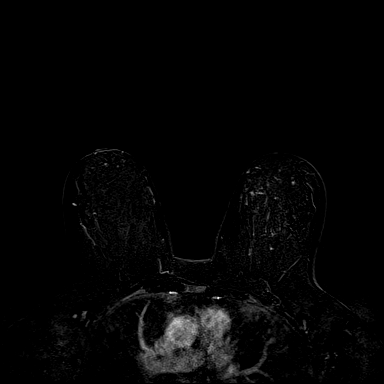
[im 86/144]
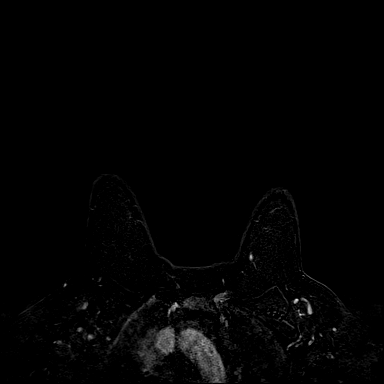
[im 115/144]
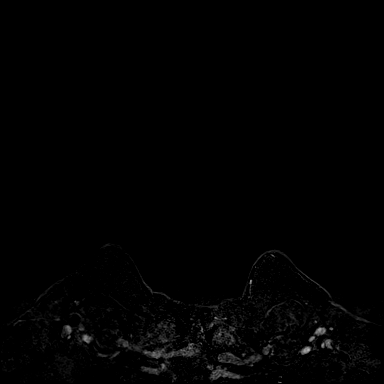
[im 144/144]
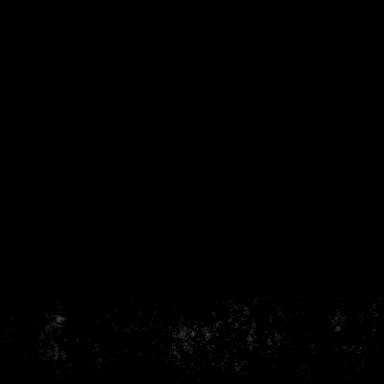

[Series 8: fl3d post immediate · axial · 172.8mm · 0.99mm/px · 1 of 1 slices shown (3 of 3)]
[im 1/1]
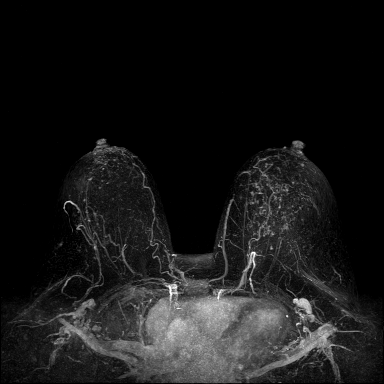

[Series 9: fl3d post 3min · axial · 1.2mm · 0.99mm/px · z∈[-33,+139]mm · 6 of 144 slices shown]
[im 1/144]
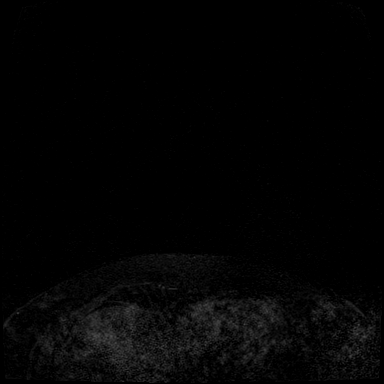
[im 29/144]
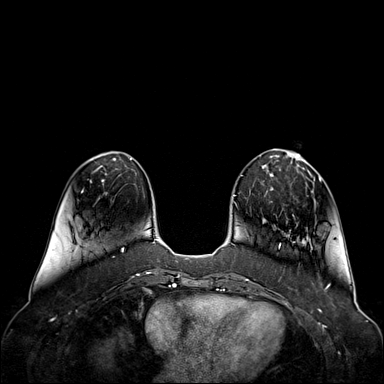
[im 58/144]
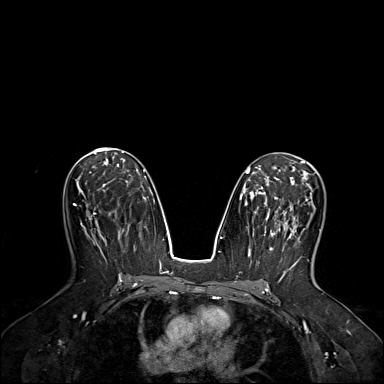
[im 86/144]
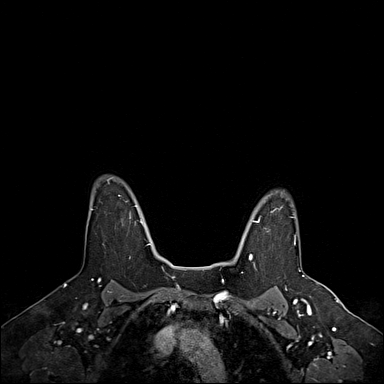
[im 115/144]
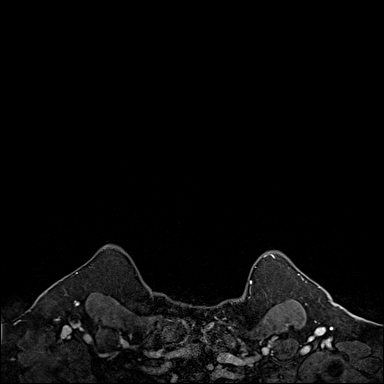
[im 144/144]
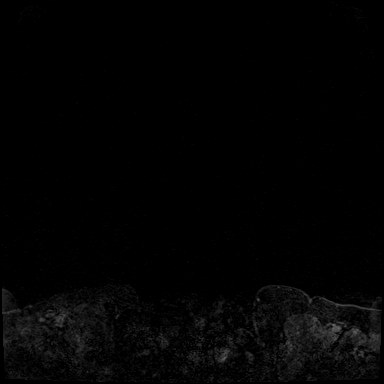

[Series 10: fl3d post 3min_sub · axial · 1.2mm · 0.99mm/px · z∈[-33,+36]mm · 3 of 144 slices shown]
[im 1/144]
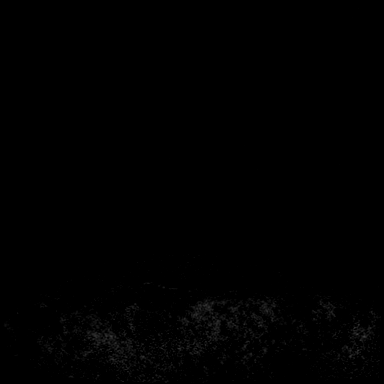
[im 29/144]
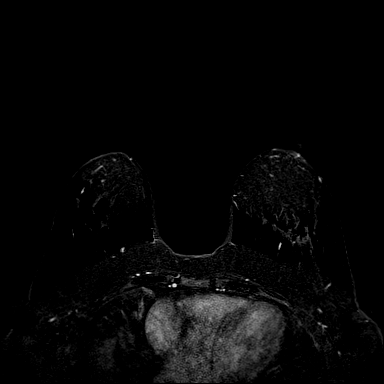
[im 58/144]
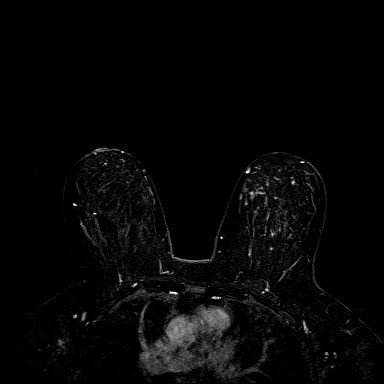

[32 of 48 positions shown; findings below may reference images not displayed]

FINDINGS: Breast composition: b.  Scattered fibroglandular tissue.

Background parenchymal enhancement: Moderate.

Right breast: No suspicious rapidly enhancing masses or abnormal
areas of enhancement in the right breast to suggest malignancy.

Left breast: There is clumped linear non mass enhancement in the
upper central left breast anterior depth measuring 1.9 cm AP
(subtraction image 66) with an additional similar area of clumped
non mass enhancement in the upper central left breast mid to
posterior depth (subtraction image 73) measuring 1.4 cm. An
enhancing mass in the upper slightly inner left breast between these
2 areas of clumped enhancement (subtraction image 71) measures 7 mm.

Lymph nodes: No abnormal appearing lymph nodes.

Ancillary findings:  None.
IMPRESSION: 1. Two sites of indeterminate clumped linear non mass enhancement in
the upper central left breast with a 7 mm enhancing mass in the
upper slightly inner left breast between these 2 areas of clumped
non mass enhancement.

2.  No MRI evidence of malignancy in the right breast.

RECOMMENDATION:
Recommend MRI guided biopsy of at least 2 areas of abnormal
enhancement seen in the left breast, suggesting MRI guided biopsy of
clumped enhancement in the upper central left breast anterior
depth(subtraction image 66) and the more posteriorly located clumped
enhancement (subtraction image 73).

BI-RADS CATEGORY  4: Suspicious.

## 2019-12-13 ENCOUNTER — Other Ambulatory Visit: Payer: Self-pay

## 2019-12-13 ENCOUNTER — Other Ambulatory Visit (HOSPITAL_COMMUNITY): Payer: Self-pay | Admitting: Internal Medicine

## 2019-12-13 ENCOUNTER — Ambulatory Visit (HOSPITAL_COMMUNITY)
Admission: RE | Admit: 2019-12-13 | Discharge: 2019-12-13 | Disposition: A | Discharge status: Home / Self Care | Payer: 59 | Source: Ambulatory Visit | Attending: Internal Medicine | Admitting: Internal Medicine

## 2019-12-13 DIAGNOSIS — M79661 Pain in right lower leg: Secondary | ICD-10-CM | POA: Diagnosis not present

## 2019-12-13 DIAGNOSIS — M79662 Pain in left lower leg: Secondary | ICD-10-CM | POA: Diagnosis not present

## 2019-12-29 ENCOUNTER — Ambulatory Visit (HOSPITAL_COMMUNITY)
Admission: RE | Admit: 2019-12-29 | Discharge: 2019-12-29 | Disposition: A | Discharge status: Home / Self Care | Payer: 59 | Source: Ambulatory Visit | Attending: Physician Assistant | Admitting: Physician Assistant

## 2019-12-29 ENCOUNTER — Other Ambulatory Visit: Payer: Self-pay

## 2019-12-29 ENCOUNTER — Other Ambulatory Visit (HOSPITAL_COMMUNITY): Payer: Self-pay | Admitting: Internal Medicine

## 2019-12-29 DIAGNOSIS — I872 Venous insufficiency (chronic) (peripheral): Secondary | ICD-10-CM

## 2019-12-30 ENCOUNTER — Ambulatory Visit (INDEPENDENT_AMBULATORY_CARE_PROVIDER_SITE_OTHER): Payer: 59 | Admitting: Physician Assistant

## 2019-12-30 VITALS — BP 162/81 | HR 71 | Temp 97.9°F | Resp 20 | Ht 61.0 in | Wt 226.8 lb

## 2019-12-30 DIAGNOSIS — M79672 Pain in left foot: Secondary | ICD-10-CM

## 2019-12-30 DIAGNOSIS — I872 Venous insufficiency (chronic) (peripheral): Secondary | ICD-10-CM

## 2019-12-30 DIAGNOSIS — M79671 Pain in right foot: Secondary | ICD-10-CM | POA: Diagnosis not present

## 2019-12-30 NOTE — Progress Notes (Signed)
Office Note     CC:  follow up Requesting Provider:  Shon Baton, MD  HPI: [name] is a 59 y.o. ([birth year]) female who presents for evaluation of bilateral foot pain and ankle swelling.  She was referred by her PCP.  She describes a 50-month history of occasional bilateral ankle swelling as well as foot pain.  She has history of plantar fasciitis and bone spurs in her feet and was recently diagnosed with a right knee meniscus tear and osteoarthritis of both knees.  She states she also recently started the keto diet and has lost about 10 pounds.  Today she states she has been without the above symptoms for the past several weeks and believes it is related to her weight loss.  She denies any current edema.  She does wear knee-high compression stockings daily and she believes this helps.  She denies any history of DVT, venous ulcerations, trauma, or prior vascular surgery.  She denies tobacco use.  She was diagnosed and treated for breast cancer last year including double mastectomy.   Past Medical History:  Diagnosis Date  . Back pain of lumbar region with sciatica   . Cancer [facility])    breast  . Family history of anesthesia complication    grandfather died under anesthesia 102's- had heart issues  . H/O carpal tunnel repair 14  . Headache(784.0)   . Hypertension   . Hypothyroidism   . PONV (postoperative nausea and vomiting)   . Sleep apnea    cpap since 10    Past Surgical History:  Procedure Laterality Date  . BILATERAL TOTAL MASTECTOMY WITH AXILLARY LYMPH NODE DISSECTION    . BREAST RECONSTRUCTION    . CERVICAL DISC ARTHROPLASTY N/A 03/19/2013   Procedure: CERVICAL FIVE TO SIX, CERVICAL SIX TO SEVEN CERVICAL ANTERIOR DISC ARTHROPLASTY;  Surgeon: Kristeen Miss, MD;  Location: [facility];  Service: Neurosurgery;  Laterality: N/A;  C56 C67 artificial disc replacement  . CESAREAN SECTION    . COLONOSCOPY    . DILATATION & CURETTAGE/HYSTEROSCOPY WITH TRUECLEAR N/A 11/25/2013    Procedure: DILATATION & CURETTAGE/HYSTEROSCOPY WITH TRUCLEAR;  Surgeon: Margarette Asal, MD;  Location: [facility];  Service: Gynecology;  Laterality: N/A;  . ECTOPIC PREGNANCY SURGERY    . GALLBLADDER SURGERY    . MOUTH SURGERY     child- dog bite  . TONSILLECTOMY    . TUBAL LIGATION      Social History   Socioeconomic History  . Marital status: Married    Spouse name: Clair Gulling  . Number of children: 3  . Years of education: college  . Highest education level: Not on file  Occupational History  . Occupation: N/A  Tobacco Use  . Smoking status: Never Smoker  . Smokeless tobacco: Never Used  . Tobacco comment: occ alcohol  Substance and Sexual Activity  . Alcohol use: Yes    Comment: socially  . Drug use: No  . Sexual activity: Not on file  Other Topics Concern  . Not on file  Social History Narrative   Denies caffeine use    Social Determinants of Health   Financial Resource Strain: Not on file  Food Insecurity: Not on file  Transportation Needs: Not on file  Physical Activity: Not on file  Stress: Not on file  Social Connections: Not on file  Intimate Partner Violence: Not on file    Family History  Problem Relation Age of Onset  . Hyperlipidemia Mother   . Heart disease Father   .  Hypertension Father   . Diabetes Father   . Cancer Maternal Grandmother        breast cancer  . Diabetes Paternal Grandmother   . Heart disease Paternal Grandfather   . Hypertension Paternal Grandfather   . Cancer Maternal Aunt        Breast - In 12's  . Cancer Paternal Aunt        Breast - in 55's  . Suicidality Other     Current Outpatient Medications  Medication Sig Dispense Refill  . ASTRAGALUS PO Take 250 mg by mouth daily.    . baclofen (LIORESAL) 20 MG tablet Take 20 mg by mouth daily as needed (MIGRAINES).     . CHELATED MAGNESIUM PO Take 300 mg by mouth daily. 2 TAB (150 MG EACH) DAILY    . Cholecalciferol (VITAMIN D3 PO) Take 5,000 Units by mouth every other day.    .  diclofenac sodium (VOLTAREN) 1 % GEL Apply topically 4 (four) times daily.    Marland Kitchen doxazosin (CARDURA) 4 MG tablet Take 1 tablet (4 mg total) by mouth at bedtime. 90 tablet 0  . Erenumab-aooe 140 MG/ML SOAJ Inject into the skin.     Marland Kitchen GLUCOSAMINE-CHONDROITIN PO Take 1 tablet by mouth in the morning and at bedtime.     . hydrochlorothiazide (MICROZIDE) 12.5 MG capsule Take 12.5 mg by mouth daily.    Marland Kitchen ibuprofen (ADVIL) 200 MG tablet Take 1 tablet (200 mg total) by mouth every 6 (six) hours as needed. (Patient taking differently: Take 200 mg by mouth every 6 (six) hours as needed for mild pain or moderate pain.) 30 tablet 0  . Lemborexant (DAYVIGO) 10 MG TABS Take 10 mg by mouth at bedtime. 90 tablet 0  . Lidocaine 0.5 % AERO Place 1 spray into both nostrils See admin instructions. CAN ONLY TAKE ONCE WEEKLY FOR MIGRAINES    . naproxen sodium (ANAPROX) 220 MG tablet Take 220 mg by mouth daily as needed.     . Omega-3 Fatty Acids (FISH OIL PO) Take 1,280 mg by mouth in the morning and at bedtime.    . OnabotulinumtoxinA (BOTOX IM) Inject into the muscle every 3 (three) months. FOR MIGRAINES    . rizatriptan (MAXALT-MLT) 10 MG disintegrating tablet Take 10 mg by mouth as needed for migraine.     . thyroid (ARMOUR) 30 MG tablet Take 30 mg by mouth daily before breakfast.     . tiZANidine (ZANAFLEX) 4 MG tablet Take 1 tablet by mouth daily as needed (MIGRAINES).     . TOPROL XL 100 MG 24 hr tablet Take 100 mg by mouth daily.     Marland Kitchen UNABLE TO FIND Take 1 tablet by mouth 2 (two) times daily. Med Name: Bonney Leitz calcium supplement  800-1200mg  daily (EACH IS 400 MG)    . UNABLE TO FIND Take 1 capsule by mouth daily. BETAGLUCAN (HERBAL) 400MG  CAPSULE. 1 CAPSULE DAILY.    Cristino Martes Root 100 MG CAPS Take 1 capsule by mouth at bedtime as needed (SLEEP).     . Vilazodone HCl (VIIBRYD) 10 MG TABS Take 1/2-1 tab po qd 90 tablet 1   No current facility-administered medications for this visit.    Facility-Administered Medications Ordered in Other Visits  Medication Dose Route Frequency Provider Last Rate Last Admin  . ondansetron (ZOFRAN) 4 mg in sodium chloride 0.9 % 50 mL IVPB  4 mg Intravenous Q6H PRN Kristeen Miss, MD        Allergies  Allergen  Reactions  . Effexor [Venlafaxine Hcl]     Weight loss and cant sleep  . Flexeril [Cyclobenzaprine] Hives     REVIEW OF SYSTEMS:   [X]  denotes positive finding, [ ]  denotes negative finding Cardiac  Comments:  Chest pain or chest pressure:    Shortness of breath upon exertion:    Short of breath when lying flat:    Irregular heart rhythm:        Vascular    Pain in calf, thigh, or hip brought on by ambulation:    Pain in feet at night that wakes you up from your sleep:     Blood clot in your veins:    Leg swelling:         Pulmonary    Oxygen at home:    Productive cough:     Wheezing:         Neurologic    Sudden weakness in arms or legs:     Sudden numbness in arms or legs:     Sudden onset of difficulty speaking or slurred speech:    Temporary loss of vision in one eye:     Problems with dizziness:         Gastrointestinal    Blood in stool:     Vomited blood:         Genitourinary    Burning when urinating:     Blood in urine:        Psychiatric    Major depression:         Hematologic    Bleeding problems:    Problems with blood clotting too easily:        Skin    Rashes or ulcers:        Constitutional    Fever or chills:      PHYSICAL EXAMINATION:  Vitals:   12/30/19 1033  BP: (!) 162/81  Pulse: 71  Resp: 20  Temp: 97.9 F (36.6 C)  TempSrc: Temporal  SpO2: 97%  Weight: 226 lb 12.8 oz (102.9 kg)  Height: 5\' 1"  (1.549 m)    General:  WDWN in NAD; vital signs documented above Gait: Not observed HENT: WNL, normocephalic Pulmonary: normal non-labored breathing , without Rales, rhonchi,  wheezing Cardiac: regular HR Abdomen:, NT, no masses Skin: without rashes Vascular  Exam/Pulses:  Right Left  Radial 2+ (normal) 2+ (normal)  DP 2+ (normal) 2+ (normal)  PT 1+ (weak) 1+ (weak)   Extremities: without ischemic changes, without Gangrene , without cellulitis; without open wounds; no significant edema noted bilaterally Musculoskeletal: no muscle wasting or atrophy  Neurologic: A&O X 3;  No focal weakness or paresthesias are detected Psychiatric:  The pt has Normal affect.   Non-Invasive Vascular Imaging:   Bilateral lower extremity venous reflux R Negative for DVT Negative for deep reflux Reflux noted at saphenofemoral junction  L Negative for DVT Negative for deep reflux Reflux noted at saphenofemoral junction    ASSESSMENT/PLAN:: 59 y.o. female here for evaluation of bilateral foot pain and swelling  Clinically patient states she no longer has the symptoms at the time of office visit Only mild reflux noted in bilateral saphenofemoral junctions; would not pursue further work-up or intervention of greater saphenous vein unless patient develops lifestyle limiting edema Encouraged continued weight loss Measured for and prescribed 15 to 20 mmHg knee-high compression to be worn daily Encouraged periodic elevation of legs as well as avoiding prolonged sitting and standing For now she may follow-up on an as-needed  basis   Dagoberto Ligas, PA-C [facility] [phone]  Clinic MD:   Oneida Alar

## 2020-01-03 ENCOUNTER — Encounter: Payer: Self-pay | Admitting: Psychiatry

## 2020-01-03 ENCOUNTER — Ambulatory Visit (INDEPENDENT_AMBULATORY_CARE_PROVIDER_SITE_OTHER): Payer: 59 | Admitting: Psychiatry

## 2020-01-03 ENCOUNTER — Other Ambulatory Visit: Payer: Self-pay

## 2020-01-03 DIAGNOSIS — F411 Generalized anxiety disorder: Secondary | ICD-10-CM

## 2020-01-03 DIAGNOSIS — G4700 Insomnia, unspecified: Secondary | ICD-10-CM

## 2020-01-03 DIAGNOSIS — F32A Depression, unspecified: Secondary | ICD-10-CM | POA: Diagnosis not present

## 2020-01-03 MED ORDER — VIIBRYD 10 MG PO TABS: ORAL_TABLET | ORAL | 1 refills | Status: DC

## 2020-01-03 MED ORDER — DAYVIGO 10 MG PO TABS: 10.0000 mg | ORAL_TABLET | Freq: Every day | ORAL | 1 refills | Status: DC

## 2020-01-03 MED ORDER — DOXAZOSIN MESYLATE 4 MG PO TABS: 4.0000 mg | ORAL_TABLET | Freq: Every day | ORAL | 0 refills | Status: DC

## 2020-01-03 NOTE — Progress Notes (Signed)
[name] [id number] 02/12/1960 59 y.o.  Subjective:   Patient ID:  [name] is a 59 y.o. (DOB 1961-01-01) female.  Chief Complaint:  Chief Complaint  Patient presents with  . Anxiety  . Sleeping Problem  . Depression    HPI [name] presents to the office today for follow-up of anxiety, depression, and insomnia. She report that she tried keto diet and reports that her pain improved in a couple of days. She reports that she has lost 10 lbs in the last 2 weeks. She reports that she has not been sleeping as well since starting keto. She reports that she stopped Dayvigo and went back on Ambien for 2.5 weeks to determine if this helps with her pain. She reports that Jarold Motto has not seemed to be as effective since then. She reports that she is able to fall asleep and then awakens after a few hours and typically has to take Ibuprofen to be able to return to sleep. Has anxiety about meeting sister and mother tomorrow. "I still worry." Denies panic attacks. She reports that she experiences some depression and describes sadness in response to family stressors. She reports concentration has been ok. She is doing books on tape. She reports that she is completing more tasks. Denies SI.   She reports that she had a recent triggering experience.  She reports that she stopped brainspotting due to having multiple appointments.   Has decided not to meet with her mother alone. Continues to deal with home renovations. Daughter has been taking care of her while having medical issues. Oldest child will be completing his dissertation and will be a Musician at [employer]. Her youngest son is going to [place] to go to school to become a Restaurant manager, fast food.   Now doing pelvic floor exercises.    Past Psychiatric Medication Trials: ambien  Ambien- Has been taking 5-10 mg Intermezzo- Helpful for sleep initiation Xanax Effexor XR-Multiple adverse effects. Severe discontinuation s/s.  Zonisamide- SI.   Lexapro-Concentration difficulty Viibryd Elavil- Apatheric, difficulty with work Topamax- Anger, irritability Benadryl- partially effective. Dayvigo- Effective. Has mild drowsiness the following day.    Review of Systems:  Review of Systems  Musculoskeletal: Negative for gait problem.       She reports improved pain since change in Keto diet  Neurological: Negative for tremors.  Psychiatric/Behavioral:       Please refer to HPI    Had increased difficulty with swelling and pain in her lower extremities. Now wearing compression stockings.   Medications: I have reviewed the patient's current medications.  Current Outpatient Medications  Medication Sig Dispense Refill  . ASTRAGALUS PO Take 250 mg by mouth daily.    . baclofen (LIORESAL) 20 MG tablet Take 20 mg by mouth daily as needed (MIGRAINES).     . CHELATED MAGNESIUM PO Take 300 mg by mouth daily. 2 TAB (150 MG EACH) DAILY    . Cholecalciferol (VITAMIN D3 PO) Take 5,000 Units by mouth every other day.    . diclofenac sodium (VOLTAREN) 1 % GEL Apply topically 4 (four) times daily.    Marland Kitchen doxazosin (CARDURA) 4 MG tablet Take 1 tablet (4 mg total) by mouth at bedtime. 90 tablet 0  . Erenumab-aooe 140 MG/ML SOAJ Inject into the skin.     Marland Kitchen GLUCOSAMINE-CHONDROITIN PO Take 1 tablet by mouth in the morning and at bedtime.     . hydrochlorothiazide (MICROZIDE) 12.5 MG capsule Take 12.5 mg by mouth daily.    Marland Kitchen  ibuprofen (ADVIL) 200 MG tablet Take 1 tablet (200 mg total) by mouth every 6 (six) hours as needed. (Patient taking differently: Take 200 mg by mouth every 6 (six) hours as needed for mild pain or moderate pain.) 30 tablet 0  . Lemborexant (DAYVIGO) 10 MG TABS Take 10 mg by mouth at bedtime. 90 tablet 1  . Lidocaine 0.5 % AERO Place 1 spray into both nostrils See admin instructions. CAN ONLY TAKE ONCE WEEKLY FOR MIGRAINES    . naproxen sodium (ANAPROX) 220 MG tablet Take 220 mg by mouth daily as needed.     . Omega-3 Fatty Acids  (FISH OIL PO) Take 1,280 mg by mouth in the morning and at bedtime.    . OnabotulinumtoxinA (BOTOX IM) Inject into the muscle every 3 (three) months. FOR MIGRAINES    . rizatriptan (MAXALT-MLT) 10 MG disintegrating tablet Take 10 mg by mouth as needed for migraine.     . thyroid (ARMOUR) 30 MG tablet Take 30 mg by mouth daily before breakfast.     . tiZANidine (ZANAFLEX) 4 MG tablet Take 1 tablet by mouth daily as needed (MIGRAINES).     . TOPROL XL 100 MG 24 hr tablet Take 100 mg by mouth daily.     Marland Kitchen UNABLE TO FIND Take 1 tablet by mouth 2 (two) times daily. Med Name: Bonney Leitz calcium supplement  800-1200mg  daily (EACH IS 400 MG)    . UNABLE TO FIND Take 1 capsule by mouth daily. BETAGLUCAN (HERBAL) 400MG  CAPSULE. 1 CAPSULE DAILY.    Cristino Martes Root 100 MG CAPS Take 1 capsule by mouth at bedtime as needed (SLEEP).     . Vilazodone HCl (VIIBRYD) 10 MG TABS Take 1/2-1 tab po qd 90 tablet 1   No current facility-administered medications for this visit.   Facility-Administered Medications Ordered in Other Visits  Medication Dose Route Frequency Provider Last Rate Last Admin  . ondansetron (ZOFRAN) 4 mg in sodium chloride 0.9 % 50 mL IVPB  4 mg Intravenous Q6H PRN Kristeen Miss, MD        Medication Side Effects: Other: Reports 35 lb wt gain in March and is unsure if this is related to meds  Allergies:  Allergies  Allergen Reactions  . Effexor [Venlafaxine Hcl]     Weight loss and cant sleep  . Flexeril [Cyclobenzaprine] Hives    Past Medical History:  Diagnosis Date  . Back pain of lumbar region with sciatica   . Cancer [facility])    breast  . Family history of anesthesia complication    grandfather died under anesthesia 16's- had heart issues  . H/O carpal tunnel repair 14  . Headache(784.0)   . Hypertension   . Hypothyroidism   . PONV (postoperative nausea and vomiting)   . Sleep apnea    cpap since 10    Family History  Problem Relation Age of Onset  . Hyperlipidemia  Mother   . Heart disease Father   . Hypertension Father   . Diabetes Father   . Cancer Maternal Grandmother        breast cancer  . Diabetes Paternal Grandmother   . Heart disease Paternal Grandfather   . Hypertension Paternal Grandfather   . Cancer Maternal Aunt        Breast - In 76's  . Cancer Paternal Aunt        Breast - in 23's  . Suicidality Other     Social History   Socioeconomic History  . Marital  status: Married    Spouse name: Clair Gulling  . Number of children: 3  . Years of education: college  . Highest education level: Not on file  Occupational History  . Occupation: N/A  Tobacco Use  . Smoking status: Never Smoker  . Smokeless tobacco: Never Used  . Tobacco comment: occ alcohol  Substance and Sexual Activity  . Alcohol use: Yes    Comment: socially  . Drug use: No  . Sexual activity: Not on file  Other Topics Concern  . Not on file  Social History Narrative   Denies caffeine use    Social Determinants of Health   Financial Resource Strain: Not on file  Food Insecurity: Not on file  Transportation Needs: Not on file  Physical Activity: Not on file  Stress: Not on file  Social Connections: Not on file  Intimate Partner Violence: Not on file    Past Medical History, Surgical history, Social history, and Family history were reviewed and updated as appropriate.   Please see review of systems for further details on the patient's review from today.   Objective:   Physical Exam:  There were no vitals taken for this visit.  Physical Exam Constitutional:      General: She is not in acute distress. Musculoskeletal:        General: No deformity.  Neurological:     Mental Status: She is alert and oriented to person, place, and time.     Coordination: Coordination normal.  Psychiatric:        Attention and Perception: Attention and perception normal. She does not perceive auditory or visual hallucinations.        Mood and Affect: Mood is not depressed.  Affect is not labile, blunt, angry or inappropriate.        Speech: Speech normal.        Behavior: Behavior normal.        Thought Content: Thought content normal. Thought content is not paranoid or delusional. Thought content does not include homicidal or suicidal ideation. Thought content does not include homicidal or suicidal plan.        Cognition and Memory: Cognition and memory normal.        Judgment: Judgment normal.     Comments: Insight intact Mildly anxious     Lab Review:     Component Value Date/Time   NA 140 01/19/2018 1556   K 4.4 01/19/2018 1556   CL 106 01/19/2018 1556   CO2 25 01/19/2018 1556   GLUCOSE 134 (H) 01/19/2018 1556   BUN 16 01/19/2018 1556   CREATININE 0.73 01/19/2018 1556   CALCIUM 8.9 01/19/2018 1556   PROT 6.8 01/19/2018 1556   ALBUMIN 3.6 01/19/2018 1556   AST 15 01/19/2018 1556   ALT 15 01/19/2018 1556   ALKPHOS 53 01/19/2018 1556   BILITOT 0.3 01/19/2018 1556   GFRNONAA >60 01/19/2018 1556   GFRAA >60 01/19/2018 1556       Component Value Date/Time   WBC 7.1 01/19/2018 1556   WBC 6.2 11/22/2013 1020   RBC 4.35 01/19/2018 1556   HGB 12.2 01/19/2018 1556   HCT 38.8 01/19/2018 1556   PLT 263 01/19/2018 1556   MCV 89.2 01/19/2018 1556   MCH 28.0 01/19/2018 1556   MCHC 31.4 01/19/2018 1556   RDW 13.5 01/19/2018 1556   LYMPHSABS 2.3 01/19/2018 1556   MONOABS 0.6 01/19/2018 1556   EOSABS 0.6 (H) 01/19/2018 1556   BASOSABS 0.1 01/19/2018 1556    No  results found for: POCLITH, LITHIUM   No results found for: PHENYTOIN, PHENOBARB, VALPROATE, CBMZ   .res Assessment: Plan:   Discussed that overall patient's mood and anxiety have improved and that anxiety is mostly situational or related to trauma. Will continue current plan of care since target signs and symptoms are well controlled without any tolerability issues. Continue Viibryd 10 mg daily for anxiety and depression. Continue Dayvigo 10 mg at bedtime for insomnia. Continue  doxazosin 4 mg at bedtime for nightmares. Patient to follow-up in 3 months or sooner if clinically indicated. Recommend continuing psychotherapy. Patient advised to contact office with any questions, adverse effects, or acute worsening in signs and symptoms.   Sara was seen today for anxiety, sleeping problem and depression.  Diagnoses and all orders for this visit:  Insomnia, unspecified type -     doxazosin (CARDURA) 4 MG tablet; Take 1 tablet (4 mg total) by mouth at bedtime. -     Lemborexant (DAYVIGO) 10 MG TABS; Take 10 mg by mouth at bedtime.  Generalized anxiety disorder -     Vilazodone HCl (VIIBRYD) 10 MG TABS; Take 1/2-1 tab po qd  Depression, unspecified depression type -     Vilazodone HCl (VIIBRYD) 10 MG TABS; Take 1/2-1 tab po qd     Please see After Visit Summary for patient specific instructions.  Future Appointments  Date Time Provider [place]  04/04/2020  2:00 PM Thayer Headings, PMHNP [facility] None  05/22/2020  1:00 PM Star Age, MD [facility] None    No orders of the defined types were placed in this encounter.   -------------------------------

## 2020-04-04 ENCOUNTER — Other Ambulatory Visit: Payer: Self-pay

## 2020-04-04 ENCOUNTER — Ambulatory Visit (INDEPENDENT_AMBULATORY_CARE_PROVIDER_SITE_OTHER): Payer: 59 | Admitting: Psychiatry

## 2020-04-04 ENCOUNTER — Encounter: Payer: Self-pay | Admitting: Psychiatry

## 2020-04-04 VITALS — BP 129/72 | HR 70 | Wt 227.0 lb

## 2020-04-04 DIAGNOSIS — G4700 Insomnia, unspecified: Secondary | ICD-10-CM

## 2020-04-04 DIAGNOSIS — F411 Generalized anxiety disorder: Secondary | ICD-10-CM | POA: Diagnosis not present

## 2020-04-04 MED ORDER — GABAPENTIN 100 MG PO CAPS: ORAL_CAPSULE | ORAL | 1 refills | Status: DC

## 2020-04-04 NOTE — Progress Notes (Signed)
[name] health [id number] [birth year] 60 y.o.  Subjective:   Patient ID:  [name] is a 60 y.o. (DOB [birth year]) female.  Chief Complaint:  Chief Complaint  Patient presents with  . Anxiety  . Depression  . Sleeping Problem    HPI [name] presents to the office today for follow-up of anxiety, depression, and insomnia. She reports "ups and downs" since last visit. Reports that she had anxiety about being approached to participate in a research study. She reports that she has difficulty standing up for herself and telling people no. Oldest son just completed his PhD in Counselling psychologist and brought his girlfriend home for the first time. She reports that she had anxiety about meeting her. She reports that she has had different worry about her children. She continues to have some intrusive memories about past traumatic events. She has been having vivid dreams and nightmares about things from the past. Denies any panic attacks. She reports depression was worse for about 3 weeks and had increased tearfulness and crying. She reports that her mood has improved over the last few weeks. She reports that chronic pain has a negative effect on her mood. She reports that pain in her feet and joints interrupts her sleep. Sleeps about 4 hours uninterrupted and then starts awakening and sleep is either fragmented or unable to go back to sleep. She reports that her energy has been low and this could be related to decreased sleep. Appetite has been good. She is trying to lose weight.  She reports that her motivation has been good. Concentration has been decreased, particularly when mood was more depressed. Reports that she had some passive death wishes when depression was worse. Denies SI.   Reports that her daughter has been having some relationship stressors. Daughter lives with them. Pt has some limited contact with her mother. Youngest son will be going to [place] in August.   Has  signed up for yoga twice a week.   She continues to see Beckey Downing, LCAS for therapy.   Past Psychiatric Medication Trials: Ambien- Has been taking 5-10 mg Intermezzo- Helpful for sleep initiation Xanax Effexor XR-Multiple adverse effects. Severe discontinuation s/s.  Zonisamide- SI.  Lexapro-Concentration difficulty Viibryd Elavil- Apatheric, difficulty with work Topamax- Anger, irritability Benadryl- partially effective. Dayvigo- Effective. Has mild drowsiness the following day.    Review of Systems:  Review of Systems  Gastrointestinal: Positive for diarrhea.  Musculoskeletal: Positive for arthralgias. Negative for gait problem.       Foot and knee pain  Neurological: Negative for tremors.       Improved migraines  Psychiatric/Behavioral:       Please refer to HPI  She reports that she has aching pain in her feet that limits her mobility. She reports that she has to be dropped off at places at the front door. Has difficulty going up and down stairs.   Medications: I have reviewed the patient's current medications.  Current Outpatient Medications  Medication Sig Dispense Refill  . ASTRAGALUS PO Take 250 mg by mouth daily.    . baclofen (LIORESAL) 20 MG tablet Take 20 mg by mouth daily as needed (MIGRAINES).     . Cholecalciferol (VITAMIN D3 PO) Take 5,000 Units by mouth every other day.    . diclofenac sodium (VOLTAREN) 1 % GEL Apply topically 4 (four) times daily.    Marland Kitchen doxazosin (CARDURA) 4 MG tablet Take 1 tablet (4 mg total) by mouth at bedtime. 90 tablet  0  . gabapentin (NEURONTIN) 100 MG capsule Take 1-3 capsules at bedtime 90 capsule 1  . Galcanezumab-gnlm (EMGALITY) 120 MG/ML SOAJ Inject into the skin.    Marland Kitchen GLUCOSAMINE-CHONDROITIN PO Take 1 tablet by mouth in the morning and at bedtime.     . hydrochlorothiazide (MICROZIDE) 12.5 MG capsule Take 12.5 mg by mouth daily.    . Lemborexant (DAYVIGO) 10 MG TABS Take 10 mg by mouth at bedtime. 90 tablet 1  . naproxen  sodium (ANAPROX) 220 MG tablet Take 220 mg by mouth daily as needed.     . Omega-3 Fatty Acids (FISH OIL PO) Take 1,280 mg by mouth in the morning and at bedtime.    . OnabotulinumtoxinA (BOTOX IM) Inject into the muscle every 3 (three) months. FOR MIGRAINES    . rizatriptan (MAXALT-MLT) 10 MG disintegrating tablet Take 10 mg by mouth as needed for migraine.     . thyroid (ARMOUR) 30 MG tablet Take 30 mg by mouth daily before breakfast.     . tiZANidine (ZANAFLEX) 4 MG tablet Take 1 tablet by mouth daily as needed (MIGRAINES).     . TOPROL XL 100 MG 24 hr tablet Take 100 mg by mouth daily.     Marland Kitchen UNABLE TO FIND Take 1 tablet by mouth 2 (two) times daily. Med Name: Bonney Leitz calcium supplement  800-1200mg  daily (EACH IS 400 MG)    . UNABLE TO FIND Take 1 capsule by mouth daily. BETAGLUCAN (HERBAL) 400MG  CAPSULE. 1 CAPSULE DAILY.    Cristino Martes Root 100 MG CAPS Take 1 capsule by mouth at bedtime as needed (SLEEP).     . Vilazodone HCl (VIIBRYD) 10 MG TABS Take 1/2-1 tab po qd 90 tablet 1  . ibuprofen (ADVIL) 200 MG tablet Take 1 tablet (200 mg total) by mouth every 6 (six) hours as needed. (Patient taking differently: Take 200 mg by mouth every 6 (six) hours as needed for mild pain or moderate pain.) 30 tablet 0  . Lidocaine 0.5 % AERO Place 1 spray into both nostrils See admin instructions. CAN ONLY TAKE ONCE WEEKLY FOR MIGRAINES     No current facility-administered medications for this visit.   Facility-Administered Medications Ordered in Other Visits  Medication Dose Route Frequency Provider Last Rate Last Admin  . ondansetron (ZOFRAN) 4 mg in sodium chloride 0.9 % 50 mL IVPB  4 mg Intravenous Q6H PRN Kristeen Miss, MD        Medication Side Effects: Other: Vivid dreams, diarrhea if she takes Viibryd on an empty stomach  Allergies:  Allergies  Allergen Reactions  . Effexor [Venlafaxine Hcl]     Weight loss and cant sleep  . Flexeril [Cyclobenzaprine] Hives    Past Medical History:   Diagnosis Date  . Back pain of lumbar region with sciatica   . Cancer [facility])    breast  . Family history of anesthesia complication    grandfather died under anesthesia 74's- had heart issues  . H/O carpal tunnel repair 14  . Headache(784.0)   . Hypertension   . Hypothyroidism   . PONV (postoperative nausea and vomiting)   . Sleep apnea    cpap since 10    Family History  Problem Relation Age of Onset  . Hyperlipidemia Mother   . Heart disease Father   . Hypertension Father   . Diabetes Father   . Cancer Maternal Grandmother        breast cancer  . Diabetes Paternal Grandmother   . Heart  disease Paternal Grandfather   . Hypertension Paternal Grandfather   . Cancer Maternal Aunt        Breast - In 33's  . Cancer Paternal Aunt        Breast - in 37's  . Suicidality Other     Social History   Socioeconomic History  . Marital status: Married    Spouse name: Clair Gulling  . Number of children: 3  . Years of education: college  . Highest education level: Not on file  Occupational History  . Occupation: N/A  Tobacco Use  . Smoking status: Never Smoker  . Smokeless tobacco: Never Used  . Tobacco comment: occ alcohol  Substance and Sexual Activity  . Alcohol use: Yes    Comment: socially  . Drug use: No  . Sexual activity: Not on file  Other Topics Concern  . Not on file  Social History Narrative   Denies caffeine use    Social Determinants of Health   Financial Resource Strain: Not on file  Food Insecurity: Not on file  Transportation Needs: Not on file  Physical Activity: Not on file  Stress: Not on file  Social Connections: Not on file  Intimate Partner Violence: Not on file    Past Medical History, Surgical history, Social history, and Family history were reviewed and updated as appropriate.   Please see review of systems for further details on the patient's review from today.   Objective:   Physical Exam:  BP 129/72   Pulse 70   Wt 227 lb (103 kg)    BMI 42.89 kg/m   Physical Exam Constitutional:      General: She is not in acute distress. Musculoskeletal:        General: No deformity.  Neurological:     Mental Status: She is alert and oriented to person, place, and time.     Coordination: Coordination normal.  Psychiatric:        Attention and Perception: Attention and perception normal. She does not perceive auditory or visual hallucinations.        Mood and Affect: Mood is anxious. Mood is not depressed. Affect is not labile, blunt, angry or inappropriate.        Speech: Speech normal.        Behavior: Behavior normal.        Thought Content: Thought content normal. Thought content is not paranoid or delusional. Thought content does not include homicidal or suicidal ideation. Thought content does not include homicidal or suicidal plan.        Cognition and Memory: Cognition and memory normal.        Judgment: Judgment normal.     Comments: Insight intact     Lab Review:     Component Value Date/Time   NA 140 01/19/2018 1556   K 4.4 01/19/2018 1556   CL 106 01/19/2018 1556   CO2 25 01/19/2018 1556   GLUCOSE 134 (H) 01/19/2018 1556   BUN 16 01/19/2018 1556   CREATININE 0.73 01/19/2018 1556   CALCIUM 8.9 01/19/2018 1556   PROT 6.8 01/19/2018 1556   ALBUMIN 3.6 01/19/2018 1556   AST 15 01/19/2018 1556   ALT 15 01/19/2018 1556   ALKPHOS 53 01/19/2018 1556   BILITOT 0.3 01/19/2018 1556   GFRNONAA >60 01/19/2018 1556   GFRAA >60 01/19/2018 1556       Component Value Date/Time   WBC 7.1 01/19/2018 1556   WBC 6.2 11/22/2013 1020   RBC 4.35 01/19/2018 1556  HGB 12.2 01/19/2018 1556   HCT 38.8 01/19/2018 1556   PLT 263 01/19/2018 1556   MCV 89.2 01/19/2018 1556   MCH 28.0 01/19/2018 1556   MCHC 31.4 01/19/2018 1556   RDW 13.5 01/19/2018 1556   LYMPHSABS 2.3 01/19/2018 1556   MONOABS 0.6 01/19/2018 1556   EOSABS 0.6 (H) 01/19/2018 1556   BASOSABS 0.1 01/19/2018 1556    No results found for: POCLITH, LITHIUM    No results found for: PHENYTOIN, PHENOBARB, VALPROATE, CBMZ   .res Assessment: Plan:   Patient seen for 30 minutes and time spent counseling patient regarding possible treatment options.  Discussed potential benefits, risks, and side effects of gabapentin.  Discussed that gabapentin is indicated for nephropathy and is used off label for anxiety and insomnia.  Patient agrees to trial of gabapentin.  Discussed starting with low-dose gabapentin and increasing based upon response and tolerability due to history of significant side effects with various medications.  We will start gabapentin 100 mg 1 to 3 capsules at bedtime. Continue doxazosin 4 mg at bedtime for nightmares. Continue Viibryd 10 mg daily for anxiety and depression. Continue Dayvigo 10 mg at bedtime for insomnia. Recommend continuing psychotherapy. Patient follow-up with this provider in 6 to 8 weeks or sooner if clinically indicated. Patient advised to contact office with any questions, adverse effects, or acute worsening in signs and symptoms.   [name] was seen today for anxiety, depression and sleeping problem.  Diagnoses and all orders for this visit:  Insomnia, unspecified type -     gabapentin (NEURONTIN) 100 MG capsule; Take 1-3 capsules at bedtime  Generalized anxiety disorder -     gabapentin (NEURONTIN) 100 MG capsule; Take 1-3 capsules at bedtime     Please see After Visit Summary for patient specific instructions.  Future Appointments  Date Time Provider [place]  05/22/2020  1:00 PM Star Age, MD [facility] None  05/24/2020 12:45 PM Thayer Headings, PMHNP [facility] None    No orders of the defined types were placed in this encounter.   -------------------------------

## 2020-04-07 ENCOUNTER — Other Ambulatory Visit: Payer: Self-pay | Admitting: Psychiatry

## 2020-04-07 DIAGNOSIS — G4700 Insomnia, unspecified: Secondary | ICD-10-CM

## 2020-04-20 ENCOUNTER — Other Ambulatory Visit: Payer: Self-pay | Admitting: Psychiatry

## 2020-04-20 DIAGNOSIS — G4700 Insomnia, unspecified: Secondary | ICD-10-CM

## 2020-05-15 ENCOUNTER — Telehealth: Payer: Self-pay | Admitting: Psychiatry

## 2020-05-15 DIAGNOSIS — G4700 Insomnia, unspecified: Secondary | ICD-10-CM

## 2020-05-15 NOTE — Telephone Encounter (Signed)
Prior authorization submitted for [place] with [facility], the request was denied because she has to take the required formularies first. Formularies : doxepin, eszopiclone, ramelteon, zolpidem, zolpidem XR, Belsomra (requirement: 3 in a class with 3 or more alternatives, 2 in a class with 2 alternatives, or 1 in a class with only 1 alternative. )   Pt has her PA for VIIBRYD 10 mg pending at this time.

## 2020-05-15 NOTE — Telephone Encounter (Signed)
[name] called in stating the insurance needed a medical necessity. She states also that if she doesn't get the form she will have to on alternative generic brand. She also wanted to make note that she used to be on [employer] CB [phone]. [facility] ph [place]

## 2020-05-16 MED ORDER — BELSOMRA 15 MG PO TABS: 15.0000 mg | ORAL_TABLET | Freq: Every day | ORAL | 0 refills | Status: DC

## 2020-05-16 NOTE — Addendum Note (Signed)
Addended by: Sharyl Nimrod on: 05/16/2020 10:02 AM   Modules accepted: Orders

## 2020-05-16 NOTE — Telephone Encounter (Signed)
Please contact pt with this information. Recommend she try Belsomra since this is covered by her plan and similar to [facility]. If it is not effective, then we may be closer to getting Dayvigo covered. Please pull samples of Belsomra 15  Mg #12 and Belsomra 20 mg #12 and advise her to try 15 mg and if no improvement, increase to 20 mg. Please tell her to call after trial to let us know if she needs a script and for what dose or if we need to try something else.

## 2020-05-17 NOTE — Telephone Encounter (Signed)
Noted  

## 2020-05-17 NOTE — Telephone Encounter (Signed)
Prior Approval received for VIIBRYD effective 05/16/2020-05/16/2021 with [facility].

## 2020-05-22 ENCOUNTER — Ambulatory Visit: Payer: 59 | Admitting: Neurology

## 2020-05-23 ENCOUNTER — Ambulatory Visit: Payer: 59 | Admitting: Neurology

## 2020-05-24 ENCOUNTER — Ambulatory Visit (INDEPENDENT_AMBULATORY_CARE_PROVIDER_SITE_OTHER): Payer: 59 | Admitting: Psychiatry

## 2020-05-24 ENCOUNTER — Encounter: Payer: Self-pay | Admitting: Psychiatry

## 2020-05-24 ENCOUNTER — Other Ambulatory Visit: Payer: Self-pay

## 2020-05-24 DIAGNOSIS — F32A Depression, unspecified: Secondary | ICD-10-CM | POA: Diagnosis not present

## 2020-05-24 DIAGNOSIS — F411 Generalized anxiety disorder: Secondary | ICD-10-CM | POA: Diagnosis not present

## 2020-05-24 DIAGNOSIS — G4700 Insomnia, unspecified: Secondary | ICD-10-CM | POA: Diagnosis not present

## 2020-05-24 MED ORDER — GABAPENTIN 100 MG PO CAPS: ORAL_CAPSULE | ORAL | 2 refills | Status: DC

## 2020-05-24 MED ORDER — BELSOMRA 20 MG PO TABS: 20.0000 mg | ORAL_TABLET | Freq: Every day | ORAL | 2 refills | Status: DC

## 2020-05-24 MED ORDER — VIIBRYD 10 MG PO TABS: ORAL_TABLET | ORAL | 1 refills | Status: DC

## 2020-05-24 MED ORDER — DOXAZOSIN MESYLATE 4 MG PO TABS: 4.0000 mg | ORAL_TABLET | Freq: Every day | ORAL | 1 refills | Status: DC

## 2020-05-24 NOTE — Progress Notes (Signed)
[name] [id number] 1960-03-11 60 y.o.  Subjective:   Patient ID:  [name] is a 60 y.o. (DOB Mar 04, 1960) female.  Chief Complaint:  Chief Complaint  Patient presents with  . Follow-up    Anxiety, depression, insomnia    HPI [name] presents to the office today for follow-up of anxiety, depression, and insomnia. She reports, "my mood is getting better." She and her daughter are working on cleaning out their house and de-cluttering. She reports that her sleep has improved. Denies nightmares. She reports that anxiety has "definitely been better since" last visit. Denies any recent panic s/s. Denies intrusive memories. She reports that she is noticing now when her anxiety is getting triggered. Denies any recent crying or tearfulness. She reports that her energy and motivation have been improved. Concentration has been ok. Denies SI.   She reports that Gabapentin has been helpful for pain and sleep. She reports that she has taken 1-2 capsules of Gabapentin.  [name] was recently denied by insurance.   She reports that her sleep has improved.   Recent trip to [place] for oldest son's PhD graduation. Her other son, [name], is going to [place] to become a Restaurant manager, fast food. Daughter is at home. 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   She will be going to meet her mother next week with her oldest son.   Has taken a break from brainspotting. Continues to see [name] for therapy.   Past Psychiatric Medication Trials: Ambien- Has been taking 5-10 mg Intermezzo- Helpful for sleep initiation Xanax Effexor XR-Multiple adverse effects. Severe discontinuation s/s.  Zonisamide- SI.  Lexapro-Concentration difficulty Viibryd Elavil- Apatheric, difficulty with work Topamax- Anger, irritability Benadryl- partially effective. Dayvigo- Effective. Has mild drowsiness the following day.       Review of Systems:  Review of  Systems  Cardiovascular: Negative for leg swelling.  Gastrointestinal: Positive for blood in stool and constipation.  Musculoskeletal: Positive for back pain. Negative for gait problem.  Neurological:       Improved neuropathy with Gabapentin.   Psychiatric/Behavioral:       Please refer to HPI    Medications: I have reviewed the patient's current medications.  Current Outpatient Medications  Medication Sig Dispense Refill  . Galcanezumab-gnlm (EMGALITY) 120 MG/ML SOAJ Inject into the skin.    . Suvorexant (BELSOMRA) 20 MG TABS Take 20 mg by mouth at bedtime. 30 tablet 2  . ASTRAGALUS PO Take 250 mg by mouth daily.    . baclofen (LIORESAL) 20 MG tablet Take 20 mg by mouth daily as needed (MIGRAINES).     . Cholecalciferol (VITAMIN D3 PO) Take 5,000 Units by mouth every other day.    . diclofenac sodium (VOLTAREN) 1 % GEL Apply topically 4 (four) times daily.    Marland Kitchen doxazosin (CARDURA) 4 MG tablet Take 1 tablet (4 mg total) by mouth at bedtime. 90 tablet 1  . gabapentin (NEURONTIN) 100 MG capsule Take 1 capsule twice daily and 1-2 capsules at bedtime 120 capsule 2  . GLUCOSAMINE-CHONDROITIN PO Take 1 tablet by mouth in the morning and at bedtime.     . hydrochlorothiazide (MICROZIDE) 12.5 MG capsule Take 12.5 mg by mouth daily.    Marland Kitchen ibuprofen (ADVIL) 200 MG tablet Take 1 tablet (200 mg total) by mouth every 6 (six) hours as needed. (Patient taking differently: Take 200 mg by mouth every 6 (six) hours as needed for mild pain or moderate pain.)  30 tablet 0  . Lidocaine 0.5 % AERO Place 1 spray into both nostrils See admin instructions. CAN ONLY TAKE ONCE WEEKLY FOR MIGRAINES    . naproxen sodium (ANAPROX) 220 MG tablet Take 220 mg by mouth daily as needed.     . Omega-3 Fatty Acids (FISH OIL PO) Take 1,280 mg by mouth in the morning and at bedtime.    . OnabotulinumtoxinA (BOTOX IM) Inject into the muscle every 3 (three) months. FOR MIGRAINES    . rizatriptan (MAXALT-MLT) 10 MG disintegrating  tablet Take 10 mg by mouth as needed for migraine.     . Suvorexant (BELSOMRA) 15 MG TABS Take 15 mg by mouth at bedtime. 12 tablet 0  . thyroid (ARMOUR) 30 MG tablet Take 30 mg by mouth daily before breakfast.     . tiZANidine (ZANAFLEX) 4 MG tablet Take 1 tablet by mouth daily as needed (MIGRAINES).     . TOPROL XL 100 MG 24 hr tablet Take 100 mg by mouth daily.     Marland Kitchen UNABLE TO FIND Take 1 tablet by mouth 2 (two) times daily. Med Name: Bonney Leitz calcium supplement  800-1200mg  daily (EACH IS 400 MG)    . UNABLE TO FIND Take 1 capsule by mouth daily. BETAGLUCAN (HERBAL) 400MG  CAPSULE. 1 CAPSULE DAILY.    Cristino Martes Root 100 MG CAPS Take 1 capsule by mouth at bedtime as needed (SLEEP).     . Vilazodone HCl (VIIBRYD) 10 MG TABS Take 1/2-1 tab po qd 90 tablet 1   No current facility-administered medications for this visit.   Facility-Administered Medications Ordered in Other Visits  Medication Dose Route Frequency Provider Last Rate Last Admin  . ondansetron (ZOFRAN) 4 mg in sodium chloride 0.9 % 50 mL IVPB  4 mg Intravenous Q6H PRN Kristeen Miss, MD        Medication Side Effects: None  Allergies:  Allergies  Allergen Reactions  . Effexor [Venlafaxine Hcl]     Weight loss and cant sleep  . Flexeril [Cyclobenzaprine] Hives    Past Medical History:  Diagnosis Date  . Back pain of lumbar region with sciatica   . Cancer [facility])    breast  . Family history of anesthesia complication    grandfather died under anesthesia 37's- had heart issues  . H/O carpal tunnel repair 14  . Headache(784.0)   . Hypertension   . Hypothyroidism   . PONV (postoperative nausea and vomiting)   . Sleep apnea    cpap since 10    Past Medical History, Surgical history, Social history, and Family history were reviewed and updated as appropriate.   Please see review of systems for further details on the patient's review from today.   Objective:   Physical Exam:  There were no vitals taken for this  visit.  Physical Exam Constitutional:      General: She is not in acute distress. Musculoskeletal:        General: No deformity.  Neurological:     Mental Status: She is alert and oriented to person, place, and time.     Coordination: Coordination normal.  Psychiatric:        Attention and Perception: Attention and perception normal. She does not perceive auditory or visual hallucinations.        Mood and Affect: Mood normal. Mood is not anxious or depressed. Affect is not labile, blunt, angry or inappropriate.        Speech: Speech normal.  Behavior: Behavior normal.        Thought Content: Thought content normal. Thought content is not paranoid or delusional. Thought content does not include homicidal or suicidal ideation. Thought content does not include homicidal or suicidal plan.        Cognition and Memory: Cognition and memory normal.        Judgment: Judgment normal.     Comments: Insight intact     Lab Review:     Component Value Date/Time   NA 140 01/19/2018 1556   K 4.4 01/19/2018 1556   CL 106 01/19/2018 1556   CO2 25 01/19/2018 1556   GLUCOSE 134 (H) 01/19/2018 1556   BUN 16 01/19/2018 1556   CREATININE 0.73 01/19/2018 1556   CALCIUM 8.9 01/19/2018 1556   PROT 6.8 01/19/2018 1556   ALBUMIN 3.6 01/19/2018 1556   AST 15 01/19/2018 1556   ALT 15 01/19/2018 1556   ALKPHOS 53 01/19/2018 1556   BILITOT 0.3 01/19/2018 1556   GFRNONAA >60 01/19/2018 1556   GFRAA >60 01/19/2018 1556       Component Value Date/Time   WBC 7.1 01/19/2018 1556   WBC 6.2 11/22/2013 1020   RBC 4.35 01/19/2018 1556   HGB 12.2 01/19/2018 1556   HCT 38.8 01/19/2018 1556   PLT 263 01/19/2018 1556   MCV 89.2 01/19/2018 1556   MCH 28.0 01/19/2018 1556   MCHC 31.4 01/19/2018 1556   RDW 13.5 01/19/2018 1556   LYMPHSABS 2.3 01/19/2018 1556   MONOABS 0.6 01/19/2018 1556   EOSABS 0.6 (H) 01/19/2018 1556   BASOSABS 0.1 01/19/2018 1556    No results found for: POCLITH, LITHIUM    No results found for: PHENYTOIN, PHENOBARB, VALPROATE, CBMZ   .res Assessment: Plan:   Patient seen for 30 minutes and time spent counseling patient regarding alternatives to [facility] since insurance will no longer cover Dayvigo.  Discussed potential benefits, risks, and side effects of Belsomra. discussed that Belsomra has a similar mechanism of action to [facility] and she therefore may have similar response to Belsomra.  Will provide patient samples with Belsomra 15 mg and 20 mg tablets.  Prescription sent to pharmacy for Belsomra 20 mg at bedtime.  Patient advised to contact office if she prefers 15 mg dose. Discussed considering trial of doxepin if Belsomra is not effective or poorly tolerated. Will start trial of increase in gabapentin since patient reports gabapentin seems to be effective for her anxiety and pain.  Discussed increasing gabapentin to 100 mg twice daily and 1 to 2 capsules at bedtime. Continue Viibryd 10 mg daily for anxiety and depression. Continue Doxazosin 4 mg at bedtime for nightmares.  Pt to follow-up in 3 months or sooner if clinically indicated.  Patient advised to contact office with any questions, adverse effects, or acute worsening in signs and symptoms.  Kaiden was seen today for follow-up.  Diagnoses and all orders for this visit:  Insomnia, unspecified type -     gabapentin (NEURONTIN) 100 MG capsule; Take 1 capsule twice daily and 1-2 capsules at bedtime -     Suvorexant (BELSOMRA) 20 MG TABS; Take 20 mg by mouth at bedtime. -     doxazosin (CARDURA) 4 MG tablet; Take 1 tablet (4 mg total) by mouth at bedtime.  Generalized anxiety disorder -     gabapentin (NEURONTIN) 100 MG capsule; Take 1 capsule twice daily and 1-2 capsules at bedtime -     Vilazodone HCl (VIIBRYD) 10 MG TABS; Take 1/2-1 tab po  qd  Depression, unspecified depression type -     Vilazodone HCl (VIIBRYD) 10 MG TABS; Take 1/2-1 tab po qd     Please see After Visit Summary for patient  specific instructions.  Future Appointments  Date Time Provider [place]  06/27/2020  1:00 PM Star Age, MD [facility] None  08/24/2020  1:00 PM Thayer Headings, PMHNP CP-CP None    No orders of the defined types were placed in this encounter.   -------------------------------

## 2020-06-25 ENCOUNTER — Encounter: Payer: Self-pay | Admitting: Neurology

## 2020-06-27 ENCOUNTER — Ambulatory Visit: Payer: 59 | Admitting: Neurology

## 2020-06-27 ENCOUNTER — Encounter: Payer: Self-pay | Admitting: Neurology

## 2020-06-27 VITALS — BP 154/99 | HR 86 | Ht 62.0 in | Wt 232.0 lb

## 2020-06-27 DIAGNOSIS — Z9989 Dependence on other enabling machines and devices: Secondary | ICD-10-CM | POA: Diagnosis not present

## 2020-06-27 DIAGNOSIS — G4733 Obstructive sleep apnea (adult) (pediatric): Secondary | ICD-10-CM | POA: Diagnosis not present

## 2020-06-27 NOTE — Progress Notes (Signed)
Subjective:    Patient ID: [name] is a 60 y.o. female.  HPI     Interim history:   Ms. Swetz is a 60 year old right-handed woman with an underlying medical history of hypertension, hypothyroidism, migraine headaches, allergic rhinitis, plantar fasciitis, cervical disc disease with myelopathy, status post neck surgery on 03/19/2013, hyperlipidemia, obesity, and DCIS with s/p b/l mastectomies, who presents for follow-up consultation of her obstructive sleep apnea for her yearly checkup.  The patient is unaccompanied today.  I last saw her on 05/20/2019, at which time she was fully compliant with her CPAP and doing well.  Today, 06/27/2020: I reviewed her CPAP compliance data from 05/27/2020 through 06/25/2020, which is a total of 30 days, during which time she used her machine every night with percent use days greater than 4 hours at 100%, indicating superb compliance with an average usage of 9 hours and 54 minutes, residual AHI at goal at 0.6/h, leak low with a 95th percentile at 0.9 L/min on a pressure of 9 cm with EPR of 3.  She reports doing well with her CPAP.  She is fully compliant with her machine and usually up-to-date with her supplies, using a nasal cushion interface.  She takes Belsomra for sleep.  She does have some daytime grogginess at times from it, did better on Dayvigo but her insurance did not cover it.  She has been seen by behavioral health for insomnia.  She had a recent office visit with Dr. Ellene Route as well.  She gets regular epidural injections about every 3 to 4 months.  The patient's allergies, current medications, family history, past medical history, past social history, past surgical history and problem list were reviewed and updated as appropriate.    Previously (copied from previous notes for reference):   I saw her in a virtual visit on 04/23/2018, at which time she was stable on her CPAP.  She was advised to follow-up in 1 year.   I reviewed her CPAP compliance  data from 04/19/2019 through 05/18/2019, which is a total of 30 days, during which time she used her machine every night with percent used days greater than 4 hours at 100%, indicating superb compliance with an average usage of 9 hours and 38 minutes. Residual AHI low and at goal at 0.4/h, leak low with a 95th percentile at 0.7 L/min on a pressure of 9 cm with EPR of 1.        I saw her on 04/21/2017, at which time she was compliant with her CPAP. She reported doing well. She was supposed to have a cardiac CT. She was in the interim diagnosed with high-grade DCIS on the left side and underwent bilateral mastectomies in November 2019.   I reviewed her CPAP compliance data from 03/22/2018 through 04/20/2018 which is a total of 30 days, during which time she used her machine every night with percent used days greater than 4 hours at 100%, indicating superb compliance with an average usage of 9 hours and 41 minutes which is rather high, residual AHI at goal at 0.4 per hour, leak on the low side with the 95th percentile at 5.4 L/m on a pressure of 9 cm with EPR of 3.     I first met her on 06/03/2013 at the request of her primary care physician, at which time she reported a prior diagnosis of OSA and she was using CPAP but her machine was defective and she needed reevaluation as she had not had a  sleep study in over 8 years. She was compliant with her CPAP at the time. She was invited for sleep study testing. She had a baseline sleep study on 06/18/2016, which showed a sleep efficiency of 77.3%, sleep latency of 37 minutes. She had a decreased percentage of REM sleep at an increased percentage of stage II sleep. Overall AHI was 0.2 per hour, REM AHI was 3.3 per hour, supine AHI was 0 per hour. Average oxygen saturation was 98%, nadir was 93%. She had no significant PLMS. She was offered a home sleep test, which she had on 07/10/2016. AHI was 6.7 per hour, average oxygen saturation 94%, nadir of 78%. Based on the  home sleep test results she was placed on CPAP therapy at home. She had a compliance follow-up appointment with Ward Givens, nurse practitioner on 10/23/2016 at which time she was doing well and compliant with CPAP therapy.   I reviewed her CPAP compliance data from 03/18/2017 through 04/16/2017 which is a total of 30 days, during which time she used her CPAP every night with percent used days greater than 4 hours at 100%, indicating superb compliance with an average usage of 8 hours and 35 minutes, residual AHI at goal at 0.2 per hour, leak acceptable with the 95th percentile at 10 L/m on a pressure of 9 cm with EPR of 3.    06/03/2016: (She) was previously diagnosed with obstructive sleep apnea and placed on CPAP therapy. She has a CPAP machine which she has been using, but some parts are defective, including the humidier and the power button and therefore, the machine does not always work. She needs an updated machine. Sleep study testing was in or around 2010. Prior sleep study results are not available for my review today. CPAP compliance download was reviewed from 01/24/2016 through 04/22/2016, which is a total of 90 days, during which time she used her CPAP every night with percent used days greater than 4 hours at 100%, average usage of 9 hours and 5 minutes, residual AHI 1 per hour, pressure at 9 cm with EPR. I reviewed your office note from 05/01/2016, which you kindly included. Her DME company is [facility]. Her Epworth sleepiness score is 9 out of 24 today, fatigue score is 51 out of 63. Her bedtime is around MN to 1 AM and she has been taking ambien nearly nightly. She reports recent increase in stress in the past few months. She has had weight gain as well. Her wakeup time is usually between 9:30 and 10. She has nocturia once per average night. She has occasional morning headaches. For her migraines she [facility] and gets Botox injections. She reports a  possible family history of obstructive sleep apnea in her father. She denies restless leg symptoms. She is a nonsmoker and drinks alcohol very occasionally, once every week or every 2 weeks. She does not drink caffeine daily. She does not work. She home schooled her 3 children. She lives at home with her husband and currently her middle daughter is back home. She has an older son and a younger son as well. She does not watch TV in her bedroom. They have a dog, not in their bedroom at night.   Her Past Medical History Is Significant For: Past Medical History:  Diagnosis Date  . Back pain of lumbar region with sciatica   . Cancer [facility])    breast  . Family history of anesthesia complication    grandfather  died under anesthesia 39's- had heart issues  . H/O carpal tunnel repair 14  . Headache(784.0)   . Hypertension   . Hypothyroidism   . PONV (postoperative nausea and vomiting)   . Sleep apnea    cpap since 10    Her Past Surgical History Is Significant For: Past Surgical History:  Procedure Laterality Date  . BILATERAL TOTAL MASTECTOMY WITH AXILLARY LYMPH NODE DISSECTION    . BREAST RECONSTRUCTION    . CERVICAL DISC ARTHROPLASTY N/A 03/19/2013   Procedure: CERVICAL FIVE TO SIX, CERVICAL SIX TO SEVEN CERVICAL ANTERIOR DISC ARTHROPLASTY;  Surgeon: Kristeen Miss, MD;  Location: [facility];  Service: Neurosurgery;  Laterality: N/A;  C56 C67 artificial disc replacement  . CESAREAN SECTION    . COLONOSCOPY    . DILATATION & CURETTAGE/HYSTEROSCOPY WITH TRUECLEAR N/A 11/25/2013   Procedure: DILATATION & CURETTAGE/HYSTEROSCOPY WITH TRUCLEAR;  Surgeon: Margarette Asal, MD;  Location: [facility];  Service: Gynecology;  Laterality: N/A;  . ECTOPIC PREGNANCY SURGERY    . GALLBLADDER SURGERY    . MOUTH SURGERY     child- dog bite  . TONSILLECTOMY    . TUBAL LIGATION      Her Family History Is Significant For: Family History  Problem Relation Age of Onset  . Hyperlipidemia Mother   . Heart  disease Father   . Hypertension Father   . Diabetes Father   . Cancer Maternal Grandmother        breast cancer  . Diabetes Paternal Grandmother   . Heart disease Paternal Grandfather   . Hypertension Paternal Grandfather   . Cancer Maternal Aunt        Breast - In 37's  . Cancer Paternal Aunt        Breast - in 9's  . Suicidality Other     Her Social History Is Significant For: Social History   Socioeconomic History  . Marital status: Married    Spouse name: Clair Gulling  . Number of children: 3  . Years of education: college  . Highest education level: Not on file  Occupational History  . Occupation: N/A  Tobacco Use  . Smoking status: Never Smoker  . Smokeless tobacco: Never Used  . Tobacco comment: occ alcohol  Substance and Sexual Activity  . Alcohol use: Yes    Comment: socially  . Drug use: No  . Sexual activity: Not on file  Other Topics Concern  . Not on file  Social History Narrative   Denies caffeine use    Social Determinants of Health   Financial Resource Strain: Not on file  Food Insecurity: Not on file  Transportation Needs: Not on file  Physical Activity: Not on file  Stress: Not on file  Social Connections: Not on file    Her Allergies Are:  Allergies  Allergen Reactions  . Effexor [Venlafaxine Hcl]     Weight loss and cant sleep  . Flexeril [Cyclobenzaprine] Hives  :   Her Current Medications Are:  Outpatient Encounter Medications as of 06/27/2020  Medication Sig  . ASTRAGALUS PO Take 250 mg by mouth daily.  . baclofen (LIORESAL) 20 MG tablet Take 20 mg by mouth daily as needed (MIGRAINES).   . Cholecalciferol (VITAMIN D3 PO) Take 5,000 Units by mouth every other day.  . diclofenac sodium (VOLTAREN) 1 % GEL Apply topically 4 (four) times daily.  Marland Kitchen doxazosin (CARDURA) 4 MG tablet Take 1 tablet (4 mg total) by mouth at bedtime.  . gabapentin (NEURONTIN) 100 MG  capsule Take 1 capsule twice daily and 1-2 capsules at bedtime  . Galcanezumab-gnlm  (EMGALITY) 120 MG/ML SOAJ Inject into the skin.  Marland Kitchen GLUCOSAMINE-CHONDROITIN PO Take 1 tablet by mouth in the morning and at bedtime.   . hydrochlorothiazide (MICROZIDE) 12.5 MG capsule Take 12.5 mg by mouth daily.  Marland Kitchen ibuprofen (ADVIL) 200 MG tablet Take 1 tablet (200 mg total) by mouth every 6 (six) hours as needed. (Patient taking differently: Take 200 mg by mouth every 6 (six) hours as needed for mild pain or moderate pain.)  . Lidocaine 0.5 % AERO Place 1 spray into both nostrils See admin instructions. CAN ONLY TAKE ONCE WEEKLY FOR MIGRAINES  . naproxen sodium (ANAPROX) 220 MG tablet Take 220 mg by mouth daily as needed.   . Omega-3 Fatty Acids (FISH OIL PO) Take 1,280 mg by mouth in the morning and at bedtime.  . OnabotulinumtoxinA (BOTOX IM) Inject into the muscle every 3 (three) months. FOR MIGRAINES  . rizatriptan (MAXALT-MLT) 10 MG disintegrating tablet Take 10 mg by mouth as needed for migraine.   . Suvorexant (BELSOMRA) 20 MG TABS Take 20 mg by mouth at bedtime.  Marland Kitchen thyroid (ARMOUR) 30 MG tablet Take 30 mg by mouth daily before breakfast.   . tiZANidine (ZANAFLEX) 4 MG tablet Take 1 tablet by mouth daily as needed (MIGRAINES).   . TOPROL XL 100 MG 24 hr tablet Take 100 mg by mouth daily.   Marland Kitchen UNABLE TO FIND Take 1 tablet by mouth 2 (two) times daily. Med Name: Bonney Leitz calcium supplement  800-1234m daily (EACH IS 400 MG)  . UNABLE TO FIND Take 1 capsule by mouth daily. BETAGLUCAN (HERBAL) 400MG CAPSULE. 1 CAPSULE DAILY.  .Cristino MartesRoot 100 MG CAPS Take 1 capsule by mouth at bedtime as needed (SLEEP).   . Vilazodone HCl (VIIBRYD) 10 MG TABS Take 1/2-1 tab po qd  . [DISCONTINUED] Suvorexant (BELSOMRA) 15 MG TABS Take 15 mg by mouth at bedtime.   Facility-Administered Encounter Medications as of 06/27/2020  Medication  . ondansetron (ZOFRAN) 4 mg in sodium chloride 0.9 % 50 mL IVPB  :  Review of Systems:  Out of a complete 14 point review of systems, all are reviewed and negative  with the exception of these symptoms as listed below: Review of Systems  Neurological:       Here for yearly f/u. Reports she has been doing well since her last visit.     Objective:  Neurological Exam  Physical Exam Physical Examination:   Vitals:   06/27/20 1306  BP: (!) 154/99  Pulse: 86    General Examination: The patient is a very pleasant 60y.o. female in no acute distress. She appears well-developed and well-nourished and well groomed.   HEENT:Normocephalic, atraumatic, pupils are equal, round and reactive to light, tracking is good, hearing is grossly intact. Face is symmetric with normal facial animation.Speech is clear with no dysarthria noted. There is no hypophonia. There is no lip, neck/head, jaw or voice tremor. Neck is supple with FROM. Oropharynx exam reveals:no new changes, mild mouth dryness noted.  Chest:Clear to auscultation without wheezing, rhonchi or crackles noted.  Heart:S1+S2+0, regular and normal without murmurs, rubs or gallops noted.   Abdomen:Soft, non-tender and non-distended.  Extremities:There isnoobv. edema in the distal lower extremities bilaterally.   Skin: Warm and dry without trophic changes noted.  Musculoskeletal: exam reveals no obvious joint deformities.   Neurologically:  Mental status: The patient is awake, alert and oriented in all 4  spheres.Herimmediate and remote memory, attention, language skills and fund of knowledge are appropriate. There is no evidence of aphasia, agnosia, apraxia or anomia. Speech is clear with normal prosody and enunciation. Thought process is linear. Mood is normaland affect is normal.  Cranial nerves II - XII are as described above under HEENT exam. In addition: shoulder shrug is normal with equal shoulder height noted. Motor exam: Normal bulk, strength and tone is noted. There is no tremor. Fine motor skills and coordination:grosslyintact.  Cerebellar testing: No dysmetria or  intention tremor. There is no truncal or gait ataxia.  Sensory exam: intact to light touch.  Gait, station and balance:Shestands easily. No veering to one side is noted. No leaning to one side is noted. Posture is age-appropriate and stance is narrow based. Gait showsnormalstride length and normalpace. No problems turning are noted.   Assessmentand Plan:  In summary,Alicia D Herringis a very pleasant54 year oldfemalewith an underlying medical history of hypertension, hypothyroidism, migraine headaches, allergic rhinitis, plantar fasciitis, cervical disc disease with myelopathy, status post neck surgery on 03/19/2013 under Dr.Elsner, with history of lumbar epidural steroid injections,hyperlipidemia, and obesity, whopresents for follow-up consultation of her of her obstructive sleep apnea, well established on CPAP at 9 cm. She continues to be fully compliant with treatment has ongoing good results.  She is typically up-to-date with supplies.  I renewed her prescription for her supplies on an ongoing basis.  She is commended for her treatment adherence and advised to follow-up routinely to see one of our nurse practitioners in 1 year, sooner if needed. I answered all her questions today and she was in agreement. I spent 20 minutes in total face-to-face time and in reviewing records during pre-charting, more than 50% of which was spent in counseling and coordination of care, reviewing test results, reviewing medications and treatment regimen and/or in discussing or reviewing the diagnosis of OSA, the prognosis and treatment options. Pertinent laboratory and imaging test results that were available during this visit with the patient were reviewed by me and considered in my medical decision making (see chart for details).

## 2020-06-27 NOTE — Patient Instructions (Addendum)
Please continue using your CPAP regularly. While your insurance requires that you use CPAP at least 4 hours each night on 70% of the nights, I recommend, that you not skip any nights and use it throughout the night if you can. Getting used to CPAP and staying with the treatment long term does take time and patience and discipline. Untreated obstructive sleep apnea when it is moderate to severe can have an adverse impact on cardiovascular health and raise her risk for heart disease, arrhythmias, hypertension, congestive heart failure, stroke and diabetes. Untreated obstructive sleep apnea causes sleep disruption, nonrestorative sleep, and sleep deprivation. This can have an impact on your day to day functioning and cause daytime sleepiness and impairment of cognitive function, memory loss, mood disturbance, and problems focussing. Using CPAP regularly can improve these symptoms. We can see you in 1 year, you can see one of our nurse practitioners as you are stable.  

## 2020-06-29 ENCOUNTER — Telehealth: Payer: Self-pay | Admitting: Psychiatry

## 2020-06-29 DIAGNOSIS — G4700 Insomnia, unspecified: Secondary | ICD-10-CM

## 2020-06-29 MED ORDER — DOXEPIN HCL 10 MG PO CAPS: 10.0000 mg | ORAL_CAPSULE | Freq: Every evening | ORAL | 1 refills | Status: DC | PRN

## 2020-06-29 NOTE — Telephone Encounter (Signed)
Please let pt know that script was sent for Doxepin. It is a preferred medication under her plan and it looks like insurance requires trial of 3 preferred meds before they will cover Dayvigo. This would be the third trial (Ambien, Belsomra, and now Doxepin). Please advise her to call back if she has side effects or it is not effective and then a script can be submitted for Dayvigo.

## 2020-06-29 NOTE — Telephone Encounter (Signed)
Pt called reporting Belsomra is not working for a sleep aid. Tried for several weeks. Contact @ [phone]

## 2020-06-29 NOTE — Telephone Encounter (Signed)
Please review

## 2020-06-30 NOTE — Telephone Encounter (Signed)
LVM with info and to call back with any questions.

## 2020-07-10 ENCOUNTER — Telehealth: Payer: Self-pay | Admitting: Psychiatry

## 2020-07-10 DIAGNOSIS — G4700 Insomnia, unspecified: Secondary | ICD-10-CM

## 2020-07-10 MED ORDER — DAYVIGO 10 MG PO TABS: 10.0000 mg | ORAL_TABLET | Freq: Every day | ORAL | 1 refills | Status: DC

## 2020-07-10 NOTE — Telephone Encounter (Signed)
Please review

## 2020-07-10 NOTE — Telephone Encounter (Signed)
Pt left a message and said that the doxepine is not working for sleeping  and she would like [place] to send in a ppeal to [facility] for the [place]. She has tried three other sleep meds and none worked

## 2020-07-10 NOTE — Telephone Encounter (Signed)
Script sent for [facility] since she has now tried and failed the preferred medications under her plan (Ambien, Belsomra, Doxepin).

## 2020-07-11 MED ORDER — DAYVIGO 10 MG PO TABS: 10.0000 mg | ORAL_TABLET | Freq: Every day | ORAL | 0 refills | Status: DC

## 2020-07-11 NOTE — Telephone Encounter (Signed)
Please let her know script was just sent to her pharmacy requesting change to 90 day supply

## 2020-07-11 NOTE — Addendum Note (Signed)
Addended by: Sharyl Nimrod on: 07/11/2020 04:00 PM   Modules accepted: Orders

## 2020-07-11 NOTE — Telephone Encounter (Signed)
LVM informing pt of new PA submitted

## 2020-07-11 NOTE — Telephone Encounter (Signed)
Pt wants to know can she get a 90 day rx to save money

## 2020-07-11 NOTE — Telephone Encounter (Signed)
NEW PRIOR AUTHORIZATION SUBMITTED FOR DAYVIGO

## 2020-07-11 NOTE — Telephone Encounter (Signed)
Pt informed

## 2020-07-11 NOTE — Telephone Encounter (Signed)
[name], Please let pt know her prior authorization for DAYVIGO  is approved from 07/11/2020 to 07/11/2021.

## 2020-08-24 ENCOUNTER — Encounter: Payer: Self-pay | Admitting: Psychiatry

## 2020-08-24 ENCOUNTER — Telehealth (INDEPENDENT_AMBULATORY_CARE_PROVIDER_SITE_OTHER): Payer: 59 | Admitting: Psychiatry

## 2020-08-24 DIAGNOSIS — F32A Depression, unspecified: Secondary | ICD-10-CM

## 2020-08-24 DIAGNOSIS — F411 Generalized anxiety disorder: Secondary | ICD-10-CM | POA: Diagnosis not present

## 2020-08-24 DIAGNOSIS — G4700 Insomnia, unspecified: Secondary | ICD-10-CM

## 2020-08-24 MED ORDER — DAYVIGO 10 MG PO TABS: 10.0000 mg | ORAL_TABLET | Freq: Every day | ORAL | 0 refills | Status: DC

## 2020-08-24 MED ORDER — VILAZODONE HCL 10 MG PO TABS: 10.0000 mg | ORAL_TABLET | Freq: Every day | ORAL | 0 refills | Status: DC

## 2020-08-24 MED ORDER — GABAPENTIN 100 MG PO CAPS: ORAL_CAPSULE | ORAL | 2 refills | Status: DC

## 2020-08-24 NOTE — Progress Notes (Signed)
[name] [id number] 1960-06-23 60 y.o.  Virtual Visit via Video Note I connected with pt @ on 08/24/20 at  3:00 PM EDT by a video enabled telemedicine application and verified that I am speaking with the correct person using two identifiers.   I discussed the limitations of evaluation and management by telemedicine and the availability of in person appointments. The patient expressed understanding and agreed to proceed.  I discussed the assessment and treatment plan with the patient. The patient was provided an opportunity to ask questions and all were answered. The patient agreed with the plan and demonstrated an understanding of the instructions.   The patient was advised to call back or seek an in-person evaluation if the symptoms worsen or if the condition fails to improve as anticipated.  I provided 30 minutes of non-face-to-face time during this encounter.  The patient was located at home.  The provider was located at [place].   Thayer Headings, PMHNP   Subjective:   Patient ID:  [name] is a 60 y.o. (DOB 08/29/1960) female.  Chief Complaint:  Chief Complaint  Patient presents with   [place] presents for follow-up of anxiety, insomnia, and depression. She reports that she is "sometimes" having anxiety in response to some recent stressors. Has had increase in migraines in response to stress. Having migraines at least once a week. She has not had any panic attacks. She has some periods of nausea when she is feeling rushed. She has had sad mood in response to marital stress. Sleep has been difficult, even with Dayvigo. Difficulty falling and staying asleep. She has been having nightmares and seems to be recalling more. Energy and motivation have been low. Weight has been fluctuating. She reports that she eats more when having a migraine. She reports difficulty with concentration. She reports that she has difficulty completing  some projects. Enjoys knitting and spending time with her daughter and pets. Denies SI.   Taking Gabapentin 1 capsule in the morning and 2 capsules at bedtime.   She and her husband are trying to start marital counseling. Has been contemplating separation.   Minimal caffeine use.   Past Psychiatric Medication Trials: Ambien- Has been taking 5-10 mg Intermezzo- Helpful for sleep initiation Xanax Effexor XR-Multiple adverse effects. Severe discontinuation s/s. Zonisamide- SI. Lexapro-Concentration difficulty Viibryd Elavil- Apatheric, difficulty with work Topamax- Anger, irritability Benadryl- partially effective.  Dayvigo- Effective. Has mild drowsiness the following day.  Doxepin- Excessive sedation Doxazosin Gabapentin  Review of Systems:  Review of Systems  Genitourinary:        Stress incontinence  Musculoskeletal:  Negative for gait problem.  Neurological:  Positive for headaches.  Psychiatric/Behavioral:         Please refer to HPI   Medications: I have reviewed the patient's current medications.  Current Outpatient Medications  Medication Sig Dispense Refill   baclofen (LIORESAL) 20 MG tablet Take 20 mg by mouth daily as needed (MIGRAINES).      Cholecalciferol (VITAMIN D3 PO) Take 5,000 Units by mouth every other day.     diclofenac sodium (VOLTAREN) 1 % GEL Apply topically 4 (four) times daily.     doxazosin (CARDURA) 4 MG tablet Take 1 tablet (4 mg total) by mouth at bedtime. 90 tablet 1   Galcanezumab-gnlm (EMGALITY) 120 MG/ML SOAJ Inject into the skin.     GLUCOSAMINE-CHONDROITIN PO Take 1 tablet by mouth in the morning and at bedtime.  hydrochlorothiazide (MICROZIDE) 12.5 MG capsule Take 12.5 mg by mouth daily.     ibuprofen (ADVIL) 200 MG tablet Take 1 tablet (200 mg total) by mouth every 6 (six) hours as needed. (Patient taking differently: Take 200 mg by mouth every 6 (six) hours as needed for mild pain or moderate pain.) 30 tablet 0   Lidocaine 0.5 %  AERO Place 1 spray into both nostrils See admin instructions. CAN ONLY TAKE ONCE WEEKLY FOR MIGRAINES     naproxen sodium (ANAPROX) 220 MG tablet Take 220 mg by mouth daily as needed.      Omega-3 Fatty Acids (FISH OIL PO) Take 1,280 mg by mouth in the morning and at bedtime.     OnabotulinumtoxinA (BOTOX IM) Inject into the muscle every 3 (three) months. FOR MIGRAINES     rizatriptan (MAXALT-MLT) 10 MG disintegrating tablet Take 10 mg by mouth as needed for migraine.      thyroid (ARMOUR) 30 MG tablet Take 30 mg by mouth daily before breakfast.      tiZANidine (ZANAFLEX) 4 MG tablet Take 1 tablet by mouth daily as needed (MIGRAINES).      TOPROL XL 100 MG 24 hr tablet Take 100 mg by mouth daily.      UNABLE TO FIND Take 1 capsule by mouth daily. BETAGLUCAN (HERBAL) '400MG'$  CAPSULE. 1 CAPSULE DAILY.     Valerian Root 100 MG CAPS Take 1 capsule by mouth at bedtime as needed (SLEEP).      ASTRAGALUS PO Take 250 mg by mouth daily.     gabapentin (NEURONTIN) 100 MG capsule Take 1 capsule (100 mg total) by mouth every morning AND 3 capsules (300 mg total) at bedtime. Take 1 capsule twice daily and 1-2 capsules at bedtime. 120 capsule 2   [START ON 10/03/2020] Lemborexant (DAYVIGO) 10 MG TABS Take 10 mg by mouth at bedtime. 90 tablet 0   UNABLE TO FIND Take 1 tablet by mouth 2 (two) times daily. Med Name: Bonney Leitz calcium supplement  800-'1200mg'$  daily [facility] IS 400 MG) (Patient not taking: Reported on 08/24/2020)     Vilazodone HCl (VIIBRYD) 10 MG TABS Take 1 tablet (10 mg total) by mouth daily. Take 1/2-1 tab po qd 90 tablet 0   No current facility-administered medications for this visit.   Facility-Administered Medications Ordered in Other Visits  Medication Dose Route Frequency Provider Last Rate Last Admin   ondansetron (ZOFRAN) 4 mg in sodium chloride 0.9 % 50 mL IVPB  4 mg Intravenous Q6H PRN Kristeen Miss, MD        Medication Side Effects: None  Allergies:  Allergies  Allergen Reactions    Effexor [Venlafaxine Hcl]     Weight loss and cant sleep   Flexeril [Cyclobenzaprine] Hives    Past Medical History:  Diagnosis Date   Back pain of lumbar region with sciatica    Cancer ([place])    breast   Family history of anesthesia complication    grandfather died under anesthesia 53's- had heart issues   H/O carpal tunnel repair 29   Headache(784.0)    Hypertension    Hypothyroidism    PONV (postoperative nausea and vomiting)    Sleep apnea    cpap since 10    Family History  Problem Relation Age of Onset   Hyperlipidemia Mother    Heart disease Father    Hypertension Father    Diabetes Father    Cancer Maternal Grandmother        breast cancer  Diabetes Paternal Grandmother    Heart disease Paternal Grandfather    Hypertension Paternal Grandfather    Cancer Maternal Aunt        Breast - In 26's   Cancer Paternal Aunt        Breast - in 78's   Suicidality Other     Social History   Socioeconomic History   Marital status: Married    Spouse name: Clair Gulling   Number of children: 3   Years of education: college   Highest education level: Not on file  Occupational History   Occupation: N/A  Tobacco Use   Smoking status: Never   Smokeless tobacco: Never   Tobacco comments:    occ alcohol  Substance and Sexual Activity   Alcohol use: Yes    Comment: socially   Drug use: No   Sexual activity: Not on file  Other Topics Concern   Not on file  Social History Narrative   Denies caffeine use    Social Determinants of Health   Financial Resource Strain: Not on file  Food Insecurity: Not on file  Transportation Needs: Not on file  Physical Activity: Not on file  Stress: Not on file  Social Connections: Not on file  Intimate Partner Violence: Not on file    Past Medical History, Surgical history, Social history, and Family history were reviewed and updated as appropriate.   Please see review of systems for further details on the patient's review from today.    Objective:   Physical Exam:  BP (!) 142/82   Wt 237 lb (107.5 kg)   BMI 43.35 kg/m   Physical Exam Neurological:     Mental Status: She is alert and oriented to person, place, and time.     Cranial Nerves: No dysarthria.  Psychiatric:        Attention and Perception: Attention and perception normal.        Mood and Affect: Mood is anxious and depressed.        Speech: Speech normal.        Behavior: Behavior is cooperative.        Thought Content: Thought content normal. Thought content is not paranoid or delusional. Thought content does not include homicidal or suicidal ideation. Thought content does not include homicidal or suicidal plan.        Cognition and Memory: Cognition and memory normal.        Judgment: Judgment normal.     Comments: Insight intact    Lab Review:     Component Value Date/Time   NA 140 01/19/2018 1556   K 4.4 01/19/2018 1556   CL 106 01/19/2018 1556   CO2 25 01/19/2018 1556   GLUCOSE 134 (H) 01/19/2018 1556   BUN 16 01/19/2018 1556   CREATININE 0.73 01/19/2018 1556   CALCIUM 8.9 01/19/2018 1556   PROT 6.8 01/19/2018 1556   ALBUMIN 3.6 01/19/2018 1556   AST 15 01/19/2018 1556   ALT 15 01/19/2018 1556   ALKPHOS 53 01/19/2018 1556   BILITOT 0.3 01/19/2018 1556   GFRNONAA >60 01/19/2018 1556   GFRAA >60 01/19/2018 1556       Component Value Date/Time   WBC 7.1 01/19/2018 1556   WBC 6.2 11/22/2013 1020   RBC 4.35 01/19/2018 1556   HGB 12.2 01/19/2018 1556   HCT 38.8 01/19/2018 1556   PLT 263 01/19/2018 1556   MCV 89.2 01/19/2018 1556   MCH 28.0 01/19/2018 1556   MCHC 31.4 01/19/2018 1556  RDW 13.5 01/19/2018 1556   LYMPHSABS 2.3 01/19/2018 1556   MONOABS 0.6 01/19/2018 1556   EOSABS 0.6 (H) 01/19/2018 1556   BASOSABS 0.1 01/19/2018 1556    No results found for: POCLITH, LITHIUM   No results found for: PHENYTOIN, PHENOBARB, VALPROATE, CBMZ   .res Assessment: Plan:   Pt seen for 30 minutes and time spent discussing current  s/s. She reports that she is currently experiencing increased anxiety and depression in response to marital stressors and that mood and anxiety are consistent with what she would expect considering circumstances. She reports that she would like to continue current medications, individual therapy, and initiate couples counseling. Will change Gabapentin to 100 mg in the morning and 300 mg at bedtime to possible improve anxiety at night and insomnia at night.  Continue Doxazosin 4 mg at bedtime to improve nightmares.  Continue Dayvigo 10 mg po QHS for insomnia.  Continue Viibryd 10 mg po qd for depression and anxiety.  Recommend continuing therapy with Beckey Downing, LCAS.  Patient advised to contact office with any questions, adverse effects, or acute worsening in signs and symptoms.  Indee was seen today for anxiety.  Diagnoses and all orders for this visit:  Insomnia, unspecified type -     gabapentin (NEURONTIN) 100 MG capsule; Take 1 capsule (100 mg total) by mouth every morning AND 3 capsules (300 mg total) at bedtime. Take 1 capsule twice daily and 1-2 capsules at bedtime. -     Lemborexant (DAYVIGO) 10 MG TABS; Take 10 mg by mouth at bedtime.  Generalized anxiety disorder -     gabapentin (NEURONTIN) 100 MG capsule; Take 1 capsule (100 mg total) by mouth every morning AND 3 capsules (300 mg total) at bedtime. Take 1 capsule twice daily and 1-2 capsules at bedtime. -     Vilazodone HCl (VIIBRYD) 10 MG TABS; Take 1 tablet (10 mg total) by mouth daily. Take 1/2-1 tab po qd  Depression, unspecified depression type -     Vilazodone HCl (VIIBRYD) 10 MG TABS; Take 1 tablet (10 mg total) by mouth daily. Take 1/2-1 tab po qd    Please see After Visit Summary for patient specific instructions.  Future Appointments  Date Time Provider [place]  06/27/2021  1:30 PM Ward Givens, NP [facility] None    No orders of the defined types were placed in this encounter.      -------------------------------

## 2020-11-26 ENCOUNTER — Other Ambulatory Visit: Payer: Self-pay | Admitting: Psychiatry

## 2020-11-26 DIAGNOSIS — F411 Generalized anxiety disorder: Secondary | ICD-10-CM

## 2020-11-26 DIAGNOSIS — G4700 Insomnia, unspecified: Secondary | ICD-10-CM

## 2020-11-28 NOTE — Telephone Encounter (Signed)
Last filled cardura on 09/25/20 appt on 11/29/20

## 2020-11-29 ENCOUNTER — Other Ambulatory Visit: Payer: Self-pay

## 2020-11-29 ENCOUNTER — Ambulatory Visit: Payer: 59 | Admitting: Psychiatry

## 2020-11-29 ENCOUNTER — Encounter: Payer: Self-pay | Admitting: Psychiatry

## 2020-11-29 DIAGNOSIS — F32A Depression, unspecified: Secondary | ICD-10-CM | POA: Diagnosis not present

## 2020-11-29 DIAGNOSIS — F411 Generalized anxiety disorder: Secondary | ICD-10-CM

## 2020-11-29 DIAGNOSIS — G4700 Insomnia, unspecified: Secondary | ICD-10-CM

## 2020-11-29 MED ORDER — DOXAZOSIN MESYLATE 4 MG PO TABS: 4.0000 mg | ORAL_TABLET | Freq: Every day | ORAL | 1 refills | Status: DC

## 2020-11-29 MED ORDER — GABAPENTIN 100 MG PO CAPS: ORAL_CAPSULE | ORAL | 0 refills | Status: DC

## 2020-11-29 MED ORDER — VILAZODONE HCL 10 MG PO TABS: 10.0000 mg | ORAL_TABLET | Freq: Every day | ORAL | 0 refills | Status: DC

## 2020-11-29 MED ORDER — DAYVIGO 10 MG PO TABS: 10.0000 mg | ORAL_TABLET | Freq: Every day | ORAL | 1 refills | Status: DC

## 2020-11-29 MED ORDER — LORAZEPAM 0.5 MG PO TABS: ORAL_TABLET | ORAL | 2 refills | Status: DC

## 2020-11-29 NOTE — Progress Notes (Signed)
[name] [id number] 1960/08/02 60 y.o.  Subjective:   Patient ID:  [name] is a 60 y.o. (DOB [birth year]) female.  Chief Complaint:  Chief Complaint  Patient presents with   Anxiety   Follow-up    Depression, insomnia    Anxiety    [name] presents to the office today for follow-up of anxiety, depression, and insomnia. She reports that she has had anxiety in response to marital stress. Has some anxiety with interaction with one person. She has had a few occasions of panic. Had a severe panic attack before meeting son's fiance's parents. She reports that mood is "a little bit better." Sleep is interrupted if she has migraines. Has difficulty falling asleep the night before a group that causes her anxiety. She reports that she has been forgetting and losing things. Energy and motivation have been ok. Denies SI.   Has been having more migraines. Has been craving sweets.   She and her husband are in marital counseling with [facility]. She has been seeing Beckey Downing, [facility].    She has started doing BSF again.   Son is getting married December 18th in [place]. She is having her kitchen remodeled.   Past Psychiatric Medication Trials: Ambien- Has been taking 5-10 mg Intermezzo- Helpful for sleep initiation Xanax Effexor XR-Multiple adverse effects. Severe discontinuation s/s. Zonisamide- SI. Lexapro-Concentration difficulty Viibryd Elavil- Apatheric, difficulty with work Topamax- Anger, irritability Benadryl- partially effective.  Dayvigo- Effective. Has mild drowsiness the following day.  Doxepin- Excessive sedation Doxazosin Gabapentin  Review of Systems:  Review of Systems  Musculoskeletal:  Negative for gait problem.  Neurological:  Positive for headaches.  Psychiatric/Behavioral:         Please refer to HPI   Medications: I have reviewed the patient's current medications.  Current Outpatient Medications  Medication Sig Dispense Refill    Galcanezumab-gnlm (EMGALITY) 120 MG/ML SOAJ Inject into the skin.     LORazepam (ATIVAN) 0.5 MG tablet Take 1/2-1 tablet po qd prn severe anxiety/panic 15 tablet 2   OnabotulinumtoxinA (BOTOX IM) Inject into the muscle every 3 (three) months. FOR MIGRAINES     rizatriptan (MAXALT-MLT) 10 MG disintegrating tablet Take 10 mg by mouth as needed for migraine.      ASTRAGALUS PO Take 250 mg by mouth daily.     baclofen (LIORESAL) 20 MG tablet Take 20 mg by mouth daily as needed (MIGRAINES).      Cholecalciferol (VITAMIN D3 PO) Take 5,000 Units by mouth every other day.     diclofenac sodium (VOLTAREN) 1 % GEL Apply topically 4 (four) times daily.     doxazosin (CARDURA) 4 MG tablet Take 1 tablet (4 mg total) by mouth at bedtime. 90 tablet 1   gabapentin (NEURONTIN) 100 MG capsule Take 1 capsule twice daily and 1-2 capsules at bedtime 360 capsule 0   GLUCOSAMINE-CHONDROITIN PO Take 1 tablet by mouth in the morning and at bedtime.      hydrochlorothiazide (MICROZIDE) 12.5 MG capsule Take 12.5 mg by mouth daily.     ibuprofen (ADVIL) 200 MG tablet Take 1 tablet (200 mg total) by mouth every 6 (six) hours as needed. (Patient taking differently: Take 200 mg by mouth every 6 (six) hours as needed for mild pain or moderate pain.) 30 tablet 0   Lemborexant (DAYVIGO) 10 MG TABS Take 10 mg by mouth at bedtime. 90 tablet 1   Lidocaine 0.5 % AERO Place 1 spray into both nostrils See admin instructions. CAN  ONLY TAKE ONCE WEEKLY FOR MIGRAINES     naproxen sodium (ANAPROX) 220 MG tablet Take 220 mg by mouth daily as needed.      Omega-3 Fatty Acids (FISH OIL PO) Take 1,280 mg by mouth in the morning and at bedtime.     thyroid (ARMOUR) 30 MG tablet Take 30 mg by mouth daily before breakfast.      tiZANidine (ZANAFLEX) 4 MG tablet Take 1 tablet by mouth daily as needed (MIGRAINES).      TOPROL XL 100 MG 24 hr tablet Take 100 mg by mouth daily.      UBRELVY 50 MG TABS PLEASE SEE ATTACHED FOR DETAILED DIRECTIONS      UNABLE TO FIND Take 1 tablet by mouth 2 (two) times daily. Med Name: Bonney Leitz calcium supplement  800-1200mg  daily [facility] IS 400 MG) (Patient not taking: Reported on 08/24/2020)     UNABLE TO FIND Take 1 capsule by mouth daily. BETAGLUCAN (HERBAL) 400MG  CAPSULE. 1 CAPSULE DAILY.     Valerian Root 100 MG CAPS Take 1 capsule by mouth at bedtime as needed (SLEEP).      Vilazodone HCl (VIIBRYD) 10 MG TABS Take 1 tablet (10 mg total) by mouth daily. Take 1/2-1 tab po qd 90 tablet 0   No current facility-administered medications for this visit.   Facility-Administered Medications Ordered in Other Visits  Medication Dose Route Frequency Provider Last Rate Last Admin   ondansetron (ZOFRAN) 4 mg in sodium chloride 0.9 % 50 mL IVPB  4 mg Intravenous Q6H PRN Kristeen Miss, MD        Medication Side Effects: None  Allergies:  Allergies  Allergen Reactions   Effexor [Venlafaxine Hcl]     Weight loss and cant sleep   Flexeril [Cyclobenzaprine] Hives    Past Medical History:  Diagnosis Date   Back pain of lumbar region with sciatica    Cancer ([place])    breast   Family history of anesthesia complication    grandfather died under anesthesia 74's- had heart issues   H/O carpal tunnel repair 14   Headache(784.0)    Hypertension    Hypothyroidism    PONV (postoperative nausea and vomiting)    Sleep apnea    cpap since 10    Past Medical History, Surgical history, Social history, and Family history were reviewed and updated as appropriate.   Please see review of systems for further details on the patient's review from today.   Objective:   Physical Exam:  BP (!) 146/84   Pulse 71   Physical Exam Constitutional:      General: She is not in acute distress. Musculoskeletal:        General: No deformity.  Neurological:     Mental Status: She is alert and oriented to person, place, and time.     Coordination: Coordination normal.  Psychiatric:        Attention and Perception:  Attention and perception normal. She does not perceive auditory or visual hallucinations.        Mood and Affect: Mood is anxious. Mood is not depressed. Affect is not labile, blunt, angry or inappropriate.        Speech: Speech normal.        Behavior: Behavior normal.        Thought Content: Thought content normal. Thought content is not paranoid or delusional. Thought content does not include homicidal or suicidal ideation. Thought content does not include homicidal or suicidal plan.  Cognition and Memory: Cognition and memory normal.        Judgment: Judgment normal.     Comments: Insight intact    Lab Review:     Component Value Date/Time   NA 140 01/19/2018 1556   K 4.4 01/19/2018 1556   CL 106 01/19/2018 1556   CO2 25 01/19/2018 1556   GLUCOSE 134 (H) 01/19/2018 1556   BUN 16 01/19/2018 1556   CREATININE 0.73 01/19/2018 1556   CALCIUM 8.9 01/19/2018 1556   PROT 6.8 01/19/2018 1556   ALBUMIN 3.6 01/19/2018 1556   AST 15 01/19/2018 1556   ALT 15 01/19/2018 1556   ALKPHOS 53 01/19/2018 1556   BILITOT 0.3 01/19/2018 1556   GFRNONAA >60 01/19/2018 1556   GFRAA >60 01/19/2018 1556       Component Value Date/Time   WBC 7.1 01/19/2018 1556   WBC 6.2 11/22/2013 1020   RBC 4.35 01/19/2018 1556   HGB 12.2 01/19/2018 1556   HCT 38.8 01/19/2018 1556   PLT 263 01/19/2018 1556   MCV 89.2 01/19/2018 1556   MCH 28.0 01/19/2018 1556   MCHC 31.4 01/19/2018 1556   RDW 13.5 01/19/2018 1556   LYMPHSABS 2.3 01/19/2018 1556   MONOABS 0.6 01/19/2018 1556   EOSABS 0.6 (H) 01/19/2018 1556   BASOSABS 0.1 01/19/2018 1556    No results found for: POCLITH, LITHIUM   No results found for: PHENYTOIN, PHENOBARB, VALPROATE, CBMZ   .res Assessment: Plan:    Discussed potential benefits, risk, and side effects of benzodiazepines to include potential risk of tolerance and dependence, as well as possible drowsiness.  Advised patient not to drive if experiencing drowsiness and to take  lowest possible effective dose to minimize risk of dependence and tolerance. Will start Ativan 0.5 mg 1/2-1 tab po qd prn severe anxiety/panic.  Continue Viibryd 10 mg po qd for anxiety and depression.  Continue Doxazosin 4 mg po QHS for insomnia.  Continue Gabapentin 100 mg po BID and 1-2 capsules as bedtime for anxiety and pain.  Pt to follow up in 3 months or sooner if clinically indicated.  Patient advised to contact office with any questions, adverse effects, or acute worsening in signs and symptoms.   Danaria was seen today for anxiety and follow-up.  Diagnoses and all orders for this visit:  Insomnia, unspecified type -     doxazosin (CARDURA) 4 MG tablet; Take 1 tablet (4 mg total) by mouth at bedtime. -     gabapentin (NEURONTIN) 100 MG capsule; Take 1 capsule twice daily and 1-2 capsules at bedtime -     Lemborexant (DAYVIGO) 10 MG TABS; Take 10 mg by mouth at bedtime.  Generalized anxiety disorder -     LORazepam (ATIVAN) 0.5 MG tablet; Take 1/2-1 tablet po qd prn severe anxiety/panic -     gabapentin (NEURONTIN) 100 MG capsule; Take 1 capsule twice daily and 1-2 capsules at bedtime -     Vilazodone HCl (VIIBRYD) 10 MG TABS; Take 1 tablet (10 mg total) by mouth daily. Take 1/2-1 tab po qd  Depression, unspecified depression type -     Vilazodone HCl (VIIBRYD) 10 MG TABS; Take 1 tablet (10 mg total) by mouth daily. Take 1/2-1 tab po qd    Please see After Visit Summary for patient specific instructions.  Future Appointments  Date Time Provider [place]  03/01/2021  1:00 PM Thayer Headings, PMHNP [facility] None  06/27/2021  1:30 PM Ward Givens, NP [facility] None    No  orders of the defined types were placed in this encounter.   -------------------------------

## 2020-12-04 ENCOUNTER — Other Ambulatory Visit: Payer: Self-pay | Admitting: Psychiatry

## 2020-12-04 DIAGNOSIS — G4700 Insomnia, unspecified: Secondary | ICD-10-CM

## 2020-12-04 DIAGNOSIS — F411 Generalized anxiety disorder: Secondary | ICD-10-CM

## 2021-02-14 ENCOUNTER — Other Ambulatory Visit: Payer: Self-pay | Admitting: Psychiatry

## 2021-02-14 DIAGNOSIS — F411 Generalized anxiety disorder: Secondary | ICD-10-CM

## 2021-02-26 ENCOUNTER — Other Ambulatory Visit: Payer: Self-pay | Admitting: Surgery

## 2021-02-26 DIAGNOSIS — D0512 Intraductal carcinoma in situ of left breast: Secondary | ICD-10-CM

## 2021-03-01 ENCOUNTER — Encounter: Payer: Self-pay | Admitting: Psychiatry

## 2021-03-01 ENCOUNTER — Ambulatory Visit: Payer: No Typology Code available for payment source | Admitting: Psychiatry

## 2021-03-01 ENCOUNTER — Other Ambulatory Visit: Payer: Self-pay

## 2021-03-01 DIAGNOSIS — G4700 Insomnia, unspecified: Secondary | ICD-10-CM | POA: Diagnosis not present

## 2021-03-01 DIAGNOSIS — F32A Depression, unspecified: Secondary | ICD-10-CM | POA: Diagnosis not present

## 2021-03-01 DIAGNOSIS — F411 Generalized anxiety disorder: Secondary | ICD-10-CM | POA: Diagnosis not present

## 2021-03-01 MED ORDER — GABAPENTIN 100 MG PO CAPS: ORAL_CAPSULE | ORAL | 0 refills | Status: DC

## 2021-03-01 MED ORDER — VILAZODONE HCL 10 MG PO TABS: 10.0000 mg | ORAL_TABLET | Freq: Every day | ORAL | 1 refills | Status: DC

## 2021-03-01 MED ORDER — DOXAZOSIN MESYLATE 4 MG PO TABS: 4.0000 mg | ORAL_TABLET | Freq: Every day | ORAL | 1 refills | Status: DC

## 2021-03-01 MED ORDER — LORAZEPAM 0.5 MG PO TABS: ORAL_TABLET | ORAL | 2 refills | Status: DC

## 2021-03-01 NOTE — Progress Notes (Signed)
[name] [id number] 02/20/1960 61 y.o.  Subjective:   Patient ID:  [name] is a 61 y.o. (DOB 1961-01-13) female.  Chief Complaint:  Chief Complaint  Patient presents with   Insomnia   Anxiety    Insomnia  Anxiety Symptoms include insomnia.    Alphonsa Overall presents to the office today for follow-up of anxiety, depression, and insomnia. She reports that she has anxiety and worry when she tries to go to sleep. She started taking Ativan 0.5 mg 1/2-1 tab at bedtime with Dayvigo. She reports that this helps her get to sleep. She reports that she will sleep for 4.5 hours, wake up, and then return to sleep.    She reports that she has had some anxiety in response to stressors. Oldest son got married in Dec and will be moving to [place], [place] in March. House renovations are now being completed. Had stressful interaction with mother recently. She reports that she had a panic attack around the time of son's wedding and occasionally feeling as if her throat is constricting while eating. She reports low energy. She reports some recent weight gain. Concentration has been difficult. She reports difficulty completing some tasks for [employer]. Denies SI.   Denies significant depression other than son moving to [place].   Has not been able to see therapist recently due to illness.    Has been getting massages periodically. Doing yoga twice a week.   Past Psychiatric Medication Trials: Ambien- Has been taking 5-10 mg Intermezzo- Helpful for sleep initiation Xanax Effexor XR-Multiple adverse effects. Severe discontinuation s/s. Zonisamide- SI. Lexapro-Concentration difficulty Viibryd Elavil- Apatheric, difficulty with work Topamax- Anger, irritability Benadryl- partially effective.  Dayvigo- Effective. Has mild drowsiness the following day.  Doxepin- Excessive sedation Doxazosin Gabapentin  Review of Systems:  Review of Systems  Musculoskeletal:  Negative for gait problem.        Torn tendon in right wrist  Neurological:  Positive for headaches.  Psychiatric/Behavioral:  The patient has insomnia.        Please refer to HPI   Medications: I have reviewed the patient's current medications.  Current Outpatient Medications  Medication Sig Dispense Refill   Galcanezumab-gnlm (EMGALITY) 120 MG/ML SOAJ Inject into the skin.     ASTRAGALUS PO Take 250 mg by mouth daily.     baclofen (LIORESAL) 20 MG tablet Take 20 mg by mouth daily as needed (MIGRAINES).      Cholecalciferol (VITAMIN D3 PO) Take 5,000 Units by mouth every other day.     diclofenac sodium (VOLTAREN) 1 % GEL Apply topically 4 (four) times daily.     doxazosin (CARDURA) 4 MG tablet Take 1 tablet (4 mg total) by mouth at bedtime. 90 tablet 1   gabapentin (NEURONTIN) 100 MG capsule Take 1 capsule twice daily and 1-2 capsules at bedtime 360 capsule 0   GLUCOSAMINE-CHONDROITIN PO Take 1 tablet by mouth in the morning and at bedtime.      hydrochlorothiazide (MICROZIDE) 12.5 MG capsule Take 12.5 mg by mouth daily.     ibuprofen (ADVIL) 200 MG tablet Take 1 tablet (200 mg total) by mouth every 6 (six) hours as needed. (Patient taking differently: Take 200 mg by mouth every 6 (six) hours as needed for mild pain or moderate pain.) 30 tablet 0   Lemborexant (DAYVIGO) 10 MG TABS Take 10 mg by mouth at bedtime. 90 tablet 1   Lidocaine 0.5 % AERO Place 1 spray into both nostrils See admin instructions. CAN ONLY  TAKE ONCE WEEKLY FOR MIGRAINES     LORazepam (ATIVAN) 0.5 MG tablet TAKE 1/2 TO 1 TABLET TWICE DAILY AS NEEDED FOR SEVERE ANXIETY/PANIC OR INSOMNIA 45 tablet 2   naproxen sodium (ANAPROX) 220 MG tablet Take 220 mg by mouth daily as needed.      Omega-3 Fatty Acids (FISH OIL PO) Take 1,280 mg by mouth in the morning and at bedtime.     OnabotulinumtoxinA (BOTOX IM) Inject into the muscle every 3 (three) months. FOR MIGRAINES     rizatriptan (MAXALT-MLT) 10 MG disintegrating tablet Take 10 mg by mouth as needed for  migraine.      thyroid (ARMOUR) 30 MG tablet Take 30 mg by mouth daily before breakfast.      tiZANidine (ZANAFLEX) 4 MG tablet Take 1 tablet by mouth daily as needed (MIGRAINES).      TOPROL XL 100 MG 24 hr tablet Take 100 mg by mouth daily.      UBRELVY 50 MG TABS PLEASE SEE ATTACHED FOR DETAILED DIRECTIONS     UNABLE TO FIND Take 1 tablet by mouth 2 (two) times daily. Med Name: Bonney Leitz calcium supplement  800-1200mg  daily [facility] IS 400 MG) (Patient not taking: Reported on 08/24/2020)     UNABLE TO FIND Take 1 capsule by mouth daily. BETAGLUCAN (HERBAL) 400MG  CAPSULE. 1 CAPSULE DAILY.     Valerian Root 100 MG CAPS Take 1 capsule by mouth at bedtime as needed (SLEEP).      Vilazodone HCl (VIIBRYD) 10 MG TABS Take 1 tablet (10 mg total) by mouth daily. 90 tablet 1   No current facility-administered medications for this visit.   Facility-Administered Medications Ordered in Other Visits  Medication Dose Route Frequency Provider Last Rate Last Admin   ondansetron (ZOFRAN) 4 mg in sodium chloride 0.9 % 50 mL IVPB  4 mg Intravenous Q6H PRN Kristeen Miss, MD        Medication Side Effects: None  Allergies:  Allergies  Allergen Reactions   Effexor [Venlafaxine Hcl]     Weight loss and cant sleep   Flexeril [Cyclobenzaprine] Hives    Past Medical History:  Diagnosis Date   Back pain of lumbar region with sciatica    Cancer ([place])    breast   Family history of anesthesia complication    grandfather died under anesthesia 28's- had heart issues   H/O carpal tunnel repair 14   Headache(784.0)    Hypertension    Hypothyroidism    PONV (postoperative nausea and vomiting)    Sleep apnea    cpap since 10    Past Medical History, Surgical history, Social history, and Family history were reviewed and updated as appropriate.   Please see review of systems for further details on the patient's review from today.   Objective:   Physical Exam:  There were no vitals taken for this  visit.  Physical Exam Constitutional:      General: She is not in acute distress. Musculoskeletal:        General: No deformity.  Neurological:     Mental Status: She is alert and oriented to person, place, and time.     Coordination: Coordination normal.  Psychiatric:        Attention and Perception: Attention and perception normal. She does not perceive auditory or visual hallucinations.        Mood and Affect: Mood is anxious. Mood is not depressed. Affect is not labile, blunt, angry or inappropriate.  Speech: Speech normal.        Behavior: Behavior normal.        Thought Content: Thought content normal. Thought content is not paranoid or delusional. Thought content does not include homicidal or suicidal ideation. Thought content does not include homicidal or suicidal plan.        Cognition and Memory: Cognition and memory normal.        Judgment: Judgment normal.     Comments: Insight intact    Lab Review:     Component Value Date/Time   NA 140 01/19/2018 1556   K 4.4 01/19/2018 1556   CL 106 01/19/2018 1556   CO2 25 01/19/2018 1556   GLUCOSE 134 (H) 01/19/2018 1556   BUN 16 01/19/2018 1556   CREATININE 0.73 01/19/2018 1556   CALCIUM 8.9 01/19/2018 1556   PROT 6.8 01/19/2018 1556   ALBUMIN 3.6 01/19/2018 1556   AST 15 01/19/2018 1556   ALT 15 01/19/2018 1556   ALKPHOS 53 01/19/2018 1556   BILITOT 0.3 01/19/2018 1556   GFRNONAA >60 01/19/2018 1556   GFRAA >60 01/19/2018 1556       Component Value Date/Time   WBC 7.1 01/19/2018 1556   WBC 6.2 11/22/2013 1020   RBC 4.35 01/19/2018 1556   HGB 12.2 01/19/2018 1556   HCT 38.8 01/19/2018 1556   PLT 263 01/19/2018 1556   MCV 89.2 01/19/2018 1556   MCH 28.0 01/19/2018 1556   MCHC 31.4 01/19/2018 1556   RDW 13.5 01/19/2018 1556   LYMPHSABS 2.3 01/19/2018 1556   MONOABS 0.6 01/19/2018 1556   EOSABS 0.6 (H) 01/19/2018 1556   BASOSABS 0.1 01/19/2018 1556    No results found for: POCLITH, LITHIUM   No  results found for: PHENYTOIN, PHENOBARB, VALPROATE, CBMZ   .res Assessment: Plan:    Pt seen for 30 minutes and time spent discussing recent worsening in anxiety and insomnia. Pt attributes these s/s to multiple recent stressors. She reports that she has also not been able to see therapist recently to process these stressors and triggers due to illness. Discussed option of considering increase in Viibryd to improve anxiety, however pt is concerned that this may exacerbate insomnia since she has experienced some insomnia following initiation and increase of Viibryd. Will continue Viibryd 10 mg po qd for anxiety and depression. She requests being able to use Lorazepam nightly for insomnia until acute anxiety resolves. Discussed that repeated use can increase risk of tolerance and dependence. Discussed using Lorazepam nightly short-term with plan to resume taking it as needed once stress and insomnia improve. Will change Lorazepam to 0.5 mg 1/2-1 tab po BID prn anxiety and insomnia.  Continue Doxazosin 4 mg po QHS for nightmares. Continue Gabapentin 100 mg po BID and 1-2 at bedtime for anxiety and insomnia. Recommend continuing therapy with Beckey Downing, LCAS. Pt to follow-up in 3 months or sooner if clinically indicated.  Patient advised to contact office with any questions, adverse effects, or acute worsening in signs and symptoms.   Sharran was seen today for insomnia and anxiety.  Diagnoses and all orders for this visit:  Generalized anxiety disorder -     LORazepam (ATIVAN) 0.5 MG tablet; TAKE 1/2 TO 1 TABLET TWICE DAILY AS NEEDED FOR SEVERE ANXIETY/PANIC OR INSOMNIA -     gabapentin (NEURONTIN) 100 MG capsule; Take 1 capsule twice daily and 1-2 capsules at bedtime -     Vilazodone HCl (VIIBRYD) 10 MG TABS; Take 1 tablet (10 mg total) by mouth  daily.  Insomnia, unspecified type -     doxazosin (CARDURA) 4 MG tablet; Take 1 tablet (4 mg total) by mouth at bedtime. -     gabapentin (NEURONTIN)  100 MG capsule; Take 1 capsule twice daily and 1-2 capsules at bedtime  Depression, unspecified depression type -     Vilazodone HCl (VIIBRYD) 10 MG TABS; Take 1 tablet (10 mg total) by mouth daily.     Please see After Visit Summary for patient specific instructions.  Future Appointments  Date Time Provider [place]  03/15/2021 12:10 PM GI-315 MR 1 [id number] [facility] [address] [facility]  05/31/2021  1:00 PM Thayer Headings, PMHNP CP-CP None  06/27/2021  1:30 PM Ward Givens, NP [facility] None    No orders of the defined types were placed in this encounter.   -------------------------------

## 2021-03-15 ENCOUNTER — Ambulatory Visit
Admission: RE | Admit: 2021-03-15 | Discharge: 2021-03-15 | Disposition: A | Discharge status: Home / Self Care | Payer: No Typology Code available for payment source | Source: Ambulatory Visit | Attending: Surgery | Admitting: Surgery

## 2021-03-15 DIAGNOSIS — D0512 Intraductal carcinoma in situ of left breast: Secondary | ICD-10-CM

## 2021-03-15 MED ORDER — GADOBUTROL 1 MMOL/ML IV SOLN
10.0000 mL | Freq: Once | INTRAVENOUS | Status: AC | PRN
  Administered 2021-03-15: 10 mL via INTRAVENOUS

## 2021-05-29 ENCOUNTER — Telehealth: Payer: Self-pay | Admitting: Adult Health

## 2021-05-29 NOTE — Telephone Encounter (Signed)
LVM and sent mychart msg informing pt of r/s needed for 6/7 appt- Megan out of the office. ?

## 2021-05-31 ENCOUNTER — Ambulatory Visit: Payer: No Typology Code available for payment source | Admitting: Psychiatry

## 2021-05-31 ENCOUNTER — Encounter: Payer: Self-pay | Admitting: Psychiatry

## 2021-05-31 DIAGNOSIS — G4700 Insomnia, unspecified: Secondary | ICD-10-CM

## 2021-05-31 DIAGNOSIS — F411 Generalized anxiety disorder: Secondary | ICD-10-CM

## 2021-05-31 DIAGNOSIS — F32A Depression, unspecified: Secondary | ICD-10-CM | POA: Diagnosis not present

## 2021-05-31 MED ORDER — LORAZEPAM 0.5 MG PO TABS: ORAL_TABLET | ORAL | 2 refills | Status: DC

## 2021-05-31 MED ORDER — DOXAZOSIN MESYLATE 4 MG PO TABS: 4.0000 mg | ORAL_TABLET | Freq: Every day | ORAL | 1 refills | Status: DC

## 2021-05-31 MED ORDER — GABAPENTIN 100 MG PO CAPS: ORAL_CAPSULE | ORAL | 0 refills | Status: DC

## 2021-05-31 MED ORDER — DAYVIGO 10 MG PO TABS: 10.0000 mg | ORAL_TABLET | Freq: Every day | ORAL | 1 refills | Status: DC

## 2021-05-31 MED ORDER — VILAZODONE HCL 10 MG PO TABS: 10.0000 mg | ORAL_TABLET | Freq: Every day | ORAL | 1 refills | Status: DC

## 2021-05-31 NOTE — Progress Notes (Signed)
[name] ?[id number] ?20-Apr-1960 ?61 y.o. ? ?Subjective:  ? ?Patient ID:  [name] is a 61 y.o. (DOB [birth year]) female. ? ?Chief Complaint:  ?Chief Complaint  ?Patient presents with  ? Follow-up  ?  Anxiety, mood disorder, and insomnia  ? ? ?HPI ?[name] presents to the office today for follow-up of anxiety, depression, and insomnia. She reports that her anxiety has been better Moore. She reports that Lorazepam has been helpful. She reports that she is taking a 1/2-1 tab most days. Sleeping well. Some nightmares- "not all the time" and had a distressing dream recently. She has some intrusive memories. Denies re-experiencing past traumatic events. She reports that she has noticed that she has been able to forgive others and relinquish more control recently. Denies depressed mood. Energy and motivation remains somewhat low. Appetite is increased. She reports that she craves sweets. No change in concentration. Denies SI.  ? ?Son has been in town recently. ? ?BSF just ended this week. She reports that BSF was helpful.  ? ?Continues to see Beckey Downing, LCAS for therapy. Has not been to marital counseling recently.  ? ?Past Psychiatric Medication Trials: ?Ambien- Has been taking 5-10 mg ?Intermezzo- Helpful for sleep initiation ?Xanax ?Effexor XR-Multiple adverse effects. Severe discontinuation s/s. ?Zonisamide- SI. ?Lexapro-Concentration difficulty ?Viibryd ?Elavil- Apatheric, difficulty with work ?Topamax- Anger, irritability ?Benadryl- partially effective.  ?Dayvigo- Effective. Has mild drowsiness the following day.  ?Doxepin- Excessive sedation ?Doxazosin ?Gabapentin ?  ? ?Review of Systems:  ?Review of Systems  ?Endocrine: Positive for polydipsia and polyphagia.  ?Musculoskeletal:  Negative for gait problem.  ?Neurological:   ?     Migraines have been improved over the last 2 months after period of worsening migraines.  ?Psychiatric/Behavioral:    ?     Please refer to HPI  ? ?Recent bronchitis.   ? ?Medications: I have reviewed the patient's current medications. ? ?Current Outpatient Medications  ?Medication Sig Dispense Refill  ? rosuvastatin (CRESTOR) 10 MG tablet 1 tablet Orally Once a day for 30 days    ? ASTRAGALUS PO Take 250 mg by mouth daily.    ? baclofen (LIORESAL) 20 MG tablet Take 20 mg by mouth daily as needed (MIGRAINES).     ? Cholecalciferol (VITAMIN D3 PO) Take 5,000 Units by mouth every other day.    ? diclofenac sodium (VOLTAREN) 1 % GEL Apply topically 4 (four) times daily.    ? doxazosin (CARDURA) 4 MG tablet Take 1 tablet (4 mg total) by mouth at bedtime. 90 tablet 1  ? gabapentin (NEURONTIN) 100 MG capsule Take 1 capsule twice daily and 1-2 capsules at bedtime 360 capsule 0  ? Galcanezumab-gnlm (EMGALITY) 120 MG/ML SOAJ Inject into the skin.    ? GLUCOSAMINE-CHONDROITIN PO Take 1 tablet by mouth in the morning and at bedtime.     ? hydrochlorothiazide (MICROZIDE) 12.5 MG capsule Take 12.5 mg by mouth daily.    ? ibuprofen (ADVIL) 200 MG tablet Take 1 tablet (200 mg total) by mouth every 6 (six) hours as needed. (Patient taking differently: Take 200 mg by mouth every 6 (six) hours as needed for mild pain or moderate pain.) 30 tablet 0  ? [START ON 07/02/2021] Lemborexant (DAYVIGO) 10 MG TABS Take 10 mg by mouth at bedtime. 90 tablet 1  ? Lidocaine 0.5 % AERO Place 1 spray into both nostrils See admin instructions. CAN ONLY TAKE ONCE WEEKLY FOR MIGRAINES    ? LORazepam (ATIVAN) 0.5 MG tablet TAKE 1/2  TO 1 TABLET TWICE DAILY AS NEEDED FOR SEVERE ANXIETY/PANIC OR INSOMNIA 45 tablet 2  ? naproxen sodium (ANAPROX) 220 MG tablet Take 220 mg by mouth daily as needed.     ? Omega-3 Fatty Acids (FISH OIL PO) Take 1,280 mg by mouth in the morning and at bedtime.    ? OnabotulinumtoxinA (BOTOX IM) Inject into the muscle every 3 (three) months. FOR MIGRAINES    ? rizatriptan (MAXALT-MLT) 10 MG disintegrating tablet Take 10 mg by mouth as needed for migraine.     ? thyroid (ARMOUR) 30 MG tablet Take  30 mg by mouth daily before breakfast.     ? tiZANidine (ZANAFLEX) 4 MG tablet Take 1 tablet by mouth daily as needed (MIGRAINES).     ? TOPROL XL 100 MG 24 hr tablet Take 100 mg by mouth daily.     ? UBRELVY 50 MG TABS PLEASE SEE ATTACHED FOR DETAILED DIRECTIONS    ? UNABLE TO FIND Take 1 tablet by mouth 2 (two) times daily. Med Name: Bonney Leitz calcium supplement  800-'1200mg'$  daily [facility] IS 400 MG) (Patient not taking: Reported on 08/24/2020)    ? UNABLE TO FIND Take 1 capsule by mouth daily. BETAGLUCAN (HERBAL) '400MG'$  CAPSULE. 1 CAPSULE DAILY.    ? Valerian Root 100 MG CAPS Take 1 capsule by mouth at bedtime as needed (SLEEP).     ? Vilazodone HCl (VIIBRYD) 10 MG TABS Take 1 tablet (10 mg total) by mouth daily. 90 tablet 1  ? ?No current facility-administered medications for this visit.  ? ?Facility-Administered Medications Ordered in Other Visits  ?Medication Dose Route Frequency Provider Last Rate Last Admin  ? ondansetron (ZOFRAN) 4 mg in sodium chloride 0.9 % 50 mL IVPB  4 mg Intravenous Q6H PRN Kristeen Miss, MD      ? ? ?Medication Side Effects: None ? ?Allergies:  ?Allergies  ?Allergen Reactions  ? Effexor [Venlafaxine Hcl]   ?  Weight loss and cant sleep  ? Flexeril [Cyclobenzaprine] Hives  ? ? ?Past Medical History:  ?Diagnosis Date  ? Back pain of lumbar region with sciatica   ? Cancer [facility])   ? breast  ? Family history of anesthesia complication   ? grandfather died under anesthesia 78's- had heart issues  ? H/O carpal tunnel repair 2014  ? Headache(784.0)   ? Hyperlipidemia   ? Hypertension   ? Hypothyroidism   ? PONV (postoperative nausea and vomiting)   ? Sleep apnea   ? cpap since 10  ? ? ?Past Medical History, Surgical history, Social history, and Family history were reviewed and updated as appropriate.  ? ?Please see review of systems for further details on the patient's review from today.  ? ?Objective:  ? ?Physical Exam:  ?There were no vitals taken for this visit. ? ?Physical  Exam ?Constitutional:   ?   General: She is not in acute distress. ?Musculoskeletal:     ?   General: No deformity.  ?Neurological:  ?   Mental Status: She is alert and oriented to person, place, and time.  ?   Coordination: Coordination normal.  ?Psychiatric:     ?   Attention and Perception: Attention and perception normal. She does not perceive auditory or visual hallucinations.     ?   Mood and Affect: Mood is not depressed. Affect is not labile, blunt, angry or inappropriate.     ?   Speech: Speech normal.     ?   Behavior: Behavior normal.     ?  Thought Content: Thought content normal. Thought content is not paranoid or delusional. Thought content does not include homicidal or suicidal ideation. Thought content does not include homicidal or suicidal plan.     ?   Cognition and Memory: Cognition and memory normal.     ?   Judgment: Judgment normal.  ?   Comments: Insight intact ?Mood is less anxious  ? ? ?Lab Review:  ?   ?Component Value Date/Time  ? NA 140 01/19/2018 1556  ? K 4.4 01/19/2018 1556  ? CL 106 01/19/2018 1556  ? CO2 25 01/19/2018 1556  ? GLUCOSE 134 (H) 01/19/2018 1556  ? BUN 16 01/19/2018 1556  ? CREATININE 0.73 01/19/2018 1556  ? CALCIUM 8.9 01/19/2018 1556  ? PROT 6.8 01/19/2018 1556  ? ALBUMIN 3.6 01/19/2018 1556  ? AST 15 01/19/2018 1556  ? ALT 15 01/19/2018 1556  ? ALKPHOS 53 01/19/2018 1556  ? BILITOT 0.3 01/19/2018 1556  ? GFRNONAA >60 01/19/2018 1556  ? GFRAA >60 01/19/2018 1556  ? ? ?   ?Component Value Date/Time  ? WBC 7.1 01/19/2018 1556  ? WBC 6.2 11/22/2013 1020  ? RBC 4.35 01/19/2018 1556  ? HGB 12.2 01/19/2018 1556  ? HCT 38.8 01/19/2018 1556  ? PLT 263 01/19/2018 1556  ? MCV 89.2 01/19/2018 1556  ? MCH 28.0 01/19/2018 1556  ? MCHC 31.4 01/19/2018 1556  ? RDW 13.5 01/19/2018 1556  ? LYMPHSABS 2.3 01/19/2018 1556  ? MONOABS 0.6 01/19/2018 1556  ? EOSABS 0.6 (H) 01/19/2018 1556  ? BASOSABS 0.1 01/19/2018 1556  ? ? ?No results found for: POCLITH, LITHIUM  ? ?No results found for:  PHENYTOIN, PHENOBARB, VALPROATE, CBMZ  ? ?.res ?Assessment: Plan:   ?Pt seen for 40 minutes and time spent discussing treatment plan and recent symptoms. She reports that Moore her mood and anxiety symptoms have impr

## 2021-05-31 NOTE — Patient Instructions (Signed)
-  May consider starting N-Acetylcysteine (NAC) 600 mg twice daily. May want to take it with food.  ?

## 2021-06-01 ENCOUNTER — Encounter: Payer: Self-pay | Admitting: Psychiatry

## 2021-06-04 ENCOUNTER — Telehealth: Payer: Self-pay | Admitting: Neurology

## 2021-06-04 NOTE — Telephone Encounter (Signed)
[employer] ?Member ID: [id number] ?Group [id number] ? ? ?Address: ?[address] ?[place], [place] ?Phone [phone] ?

## 2021-06-11 ENCOUNTER — Encounter: Payer: Self-pay | Admitting: Adult Health

## 2021-06-11 ENCOUNTER — Ambulatory Visit (INDEPENDENT_AMBULATORY_CARE_PROVIDER_SITE_OTHER): Payer: No Typology Code available for payment source | Admitting: Adult Health

## 2021-06-11 VITALS — BP 152/86 | HR 76 | Ht 63.0 in | Wt 253.0 lb

## 2021-06-11 DIAGNOSIS — Z9989 Dependence on other enabling machines and devices: Secondary | ICD-10-CM

## 2021-06-11 DIAGNOSIS — G4733 Obstructive sleep apnea (adult) (pediatric): Secondary | ICD-10-CM

## 2021-06-11 NOTE — Progress Notes (Signed)
PATIENT: [name] DOB: 1960-04-28  REASON FOR VISIT: follow up HISTORY FROM: patient PRIMARY NEUROLOGIST:   HISTORY OF PRESENT ILLNESS: Today 06/11/21:  [name] is a 61 year old female with a history of obstructive sleep apnea on CPAP.  She returns today for follow-up.  She is currently wearing the DreamWear mask.  Her download is below    REVIEW OF SYSTEMS: Out of a complete 14 system review of symptoms, the patient complains only of the following symptoms, and all other reviewed systems are negative.   ALLERGIES: Allergies  Allergen Reactions   Effexor [Venlafaxine Hcl]     Weight loss and cant sleep   Flexeril [Cyclobenzaprine] Hives    HOME MEDICATIONS: Outpatient Medications Prior to Visit  Medication Sig Dispense Refill   ASTRAGALUS PO Take 250 mg by mouth daily.     baclofen (LIORESAL) 20 MG tablet Take 20 mg by mouth daily as needed (MIGRAINES).      Cholecalciferol (VITAMIN D3 PO) Take 5,000 Units by mouth every other day.     diclofenac sodium (VOLTAREN) 1 % GEL Apply topically 4 (four) times daily.     doxazosin (CARDURA) 4 MG tablet Take 1 tablet (4 mg total) by mouth at bedtime. 90 tablet 1   gabapentin (NEURONTIN) 100 MG capsule Take 1 capsule twice daily and 1-2 capsules at bedtime 360 capsule 0   Galcanezumab-gnlm (EMGALITY) 120 MG/ML SOAJ Inject into the skin.     GLUCOSAMINE-CHONDROITIN PO Take 1 tablet by mouth in the morning and at bedtime.      hydrochlorothiazide (MICROZIDE) 12.5 MG capsule Take 12.5 mg by mouth daily.     ibuprofen (ADVIL) 200 MG tablet Take 1 tablet (200 mg total) by mouth every 6 (six) hours as needed. (Patient taking differently: Take 200 mg by mouth every 6 (six) hours as needed for mild pain or moderate pain.) 30 tablet 0   [START ON 07/02/2021] Lemborexant (DAYVIGO) 10 MG TABS Take 10 mg by mouth at bedtime. 90 tablet 1   Lidocaine 0.5 % AERO Place 1 spray into both nostrils See admin instructions. CAN ONLY TAKE ONCE  WEEKLY FOR MIGRAINES     LORazepam (ATIVAN) 0.5 MG tablet TAKE 1/2 TO 1 TABLET TWICE DAILY AS NEEDED FOR SEVERE ANXIETY/PANIC OR INSOMNIA 45 tablet 2   naproxen sodium (ANAPROX) 220 MG tablet Take 220 mg by mouth daily as needed.      Omega-3 Fatty Acids (FISH OIL PO) Take 1,280 mg by mouth in the morning and at bedtime.     OnabotulinumtoxinA (BOTOX IM) Inject into the muscle every 3 (three) months. FOR MIGRAINES     rizatriptan (MAXALT-MLT) 10 MG disintegrating tablet Take 10 mg by mouth as needed for migraine.      rosuvastatin (CRESTOR) 10 MG tablet 1 tablet Orally Once a day for 30 days     thyroid (ARMOUR) 30 MG tablet Take 30 mg by mouth daily before breakfast.      tiZANidine (ZANAFLEX) 4 MG tablet Take 1 tablet by mouth daily as needed (MIGRAINES).      TOPROL XL 100 MG 24 hr tablet Take 100 mg by mouth daily.      UBRELVY 50 MG TABS PLEASE SEE ATTACHED FOR DETAILED DIRECTIONS     UNABLE TO FIND Take 1 tablet by mouth 2 (two) times daily. Med Name: Bonney Leitz calcium supplement  800-'1200mg'$  daily (EACH IS 400 MG)     UNABLE TO FIND Take 1 capsule by mouth daily. BETAGLUCAN (HERBAL)  $'400MG'l$  CAPSULE. 1 CAPSULE DAILY.     Valerian Root 100 MG CAPS Take 1 capsule by mouth at bedtime as needed (SLEEP).      Vilazodone HCl (VIIBRYD) 10 MG TABS Take 1 tablet (10 mg total) by mouth daily. 90 tablet 1   Facility-Administered Medications Prior to Visit  Medication Dose Route Frequency Provider Last Rate Last Admin   ondansetron (ZOFRAN) 4 mg in sodium chloride 0.9 % 50 mL IVPB  4 mg Intravenous Q6H PRN Kristeen Miss, MD        PAST MEDICAL HISTORY: Past Medical History:  Diagnosis Date   Back pain of lumbar region with sciatica    Cancer ([place])    breast   Family history of anesthesia complication    grandfather died under anesthesia 70's- had heart issues   H/O carpal tunnel repair 2014   Headache(784.0)    Hyperlipidemia    Hypertension    Hypothyroidism    PONV (postoperative nausea  and vomiting)    Sleep apnea    cpap since 10    PAST SURGICAL HISTORY: Past Surgical History:  Procedure Laterality Date   BILATERAL TOTAL MASTECTOMY WITH AXILLARY LYMPH NODE DISSECTION     BREAST RECONSTRUCTION     CERVICAL DISC ARTHROPLASTY N/A 03/19/2013   Procedure: CERVICAL FIVE TO SIX, CERVICAL SIX TO SEVEN CERVICAL ANTERIOR [place] ARTHROPLASTY;  Surgeon: Kristeen Miss, MD;  Location: [facility];  Service: Neurosurgery;  Laterality: N/A;  C56 C67 artificial disc replacement   CESAREAN SECTION     COLONOSCOPY     DILATATION & CURETTAGE/HYSTEROSCOPY WITH TRUECLEAR N/A 11/25/2013   Procedure: DILATATION & CURETTAGE/HYSTEROSCOPY WITH TRUCLEAR;  Surgeon: Margarette Asal, MD;  Location: [facility];  Service: Gynecology;  Laterality: N/A;   ECTOPIC PREGNANCY SURGERY     GALLBLADDER SURGERY     MOUTH SURGERY     child- dog bite   TONSILLECTOMY     TUBAL LIGATION      FAMILY HISTORY: Family History  Problem Relation Age of Onset   Hyperlipidemia Mother    Heart disease Father    Hypertension Father    Diabetes Father    Cancer Maternal Aunt        Breast - In 76's   Cancer Paternal Aunt        Breast - in 10's   Cancer Maternal Grandmother        breast cancer   Diabetes Paternal Grandmother    Heart disease Paternal Grandfather    Hypertension Paternal Grandfather    Suicidality Other    Sleep apnea Neg Hx     SOCIAL HISTORY: Social History   Socioeconomic History   Marital status: Married    Spouse name: Clair Gulling   Number of children: 3   Years of education: college   Highest education level: Not on file  Occupational History   Occupation: N/A  Tobacco Use   Smoking status: Never   Smokeless tobacco: Never   Tobacco comments:    occ alcohol  Substance and Sexual Activity   Alcohol use: Yes    Comment: occ   Drug use: No   Sexual activity: Not on file  Other Topics Concern   Not on file  Social History Narrative   Denies caffeine use    Social Determinants  of Health   Financial Resource Strain: Not on file  Food Insecurity: Not on file  Transportation Needs: Not on file  Physical Activity: Not on file  Stress: Not on  file  Social Connections: Not on file  Intimate Partner Violence: Not on file      PHYSICAL EXAM  Vitals:   06/11/21 1002  BP: (!) 152/86  Pulse: 76  Weight: 253 lb (114.8 kg)  Height: '5\' 3"'$  (1.6 m)   Body mass index is 44.82 kg/m.  Generalized: Well developed, in no acute distress  Chest: Lungs clear to auscultation bilaterally  Neurological examination  Mentation: Alert oriented to time, place, history taking. Follows all commands speech and language fluent Cranial nerve II-XII: Extraocular movements were full, visual field were full on confrontational test Head turning and shoulder shrug  were normal and symmetric. Motor: The motor testing reveals 5 over 5 strength of all 4 extremities. Good symmetric motor tone is noted throughout.  Sensory: Sensory testing is intact to soft touch on all 4 extremities. No evidence of extinction is noted.  Gait and station: Gait is normal.    DIAGNOSTIC DATA (LABS, IMAGING, TESTING) - I reviewed patient records, labs, notes, testing and imaging myself where available.  Lab Results  Component Value Date   WBC 7.1 01/19/2018   HGB 12.2 01/19/2018   HCT 38.8 01/19/2018   MCV 89.2 01/19/2018   PLT 263 01/19/2018      Component Value Date/Time   NA 140 01/19/2018 1556   K 4.4 01/19/2018 1556   CL 106 01/19/2018 1556   CO2 25 01/19/2018 1556   GLUCOSE 134 (H) 01/19/2018 1556   BUN 16 01/19/2018 1556   CREATININE 0.73 01/19/2018 1556   CALCIUM 8.9 01/19/2018 1556   PROT 6.8 01/19/2018 1556   ALBUMIN 3.6 01/19/2018 1556   AST 15 01/19/2018 1556   ALT 15 01/19/2018 1556   ALKPHOS 53 01/19/2018 1556   BILITOT 0.3 01/19/2018 1556   GFRNONAA >60 01/19/2018 1556   GFRAA >60 01/19/2018 1556       ASSESSMENT AND PLAN 61 y.o. year old female  has a past medical  history of Back pain of lumbar region with sciatica, Cancer ([place]), Family history of anesthesia complication, H/O carpal tunnel repair (2014), Headache(784.0), Hyperlipidemia, Hypertension, Hypothyroidism, PONV (postoperative nausea and vomiting), and Sleep apnea. here with:  OSA on CPAP  - CPAP compliance excellent - Good treatment of AHI  - Encourage patient to use CPAP nightly and > 4 hours each night - F/U in 1 year or sooner if needed     Ward Givens, MSN, NP-C 06/11/2021, 10:13 AM [facility] [address] [place], [place] [address] [phone]

## 2021-06-11 NOTE — Patient Instructions (Signed)
Continue using CPAP nightly and greater than 4 hours each night °If your symptoms worsen or you develop new symptoms please let us know.  ° °

## 2021-06-13 ENCOUNTER — Other Ambulatory Visit: Payer: Self-pay | Admitting: Adult Health

## 2021-06-13 DIAGNOSIS — G4733 Obstructive sleep apnea (adult) (pediatric): Secondary | ICD-10-CM

## 2021-06-14 NOTE — Progress Notes (Signed)
Denyse Amass, RN; [name] got it      Previous Messages   ----- Message -----  From: Brandon Melnick, RN  Sent: 06/14/2021  11:47 AM EDT  To: Ocie Bob   Hi   New order in Epic for pt for cpap supplies   [name]  Female, 61 y.o., November 23, 1960  MRN:  [id number]  [name]

## 2021-06-27 ENCOUNTER — Telehealth: Payer: 59 | Admitting: Adult Health

## 2021-06-29 ENCOUNTER — Encounter: Payer: Self-pay | Admitting: Adult Health

## 2021-07-03 NOTE — Telephone Encounter (Signed)
Message Received: Today Denyse Amass, RN; Vanessa Ralphs I will call the insurance company and see if we are in-network and I will let you know.      Previous Messages    ----- Message -----  From: Brandon Melnick, RN  Sent: 07/02/2021   8:54 AM EDT  To: Vanessa Ralphs; Marchelle Gearing   Good morning,   Can you assist?  This pt has contacted [place] about her cpap supplies and insurance.  Been 2 wks since received message back that received order.     Kristen Cardinal. Hlavac  Female, 61 y.o., December 13, 1960  MRN:  [id number]   Primary Coverage   Payer Plan Sponsor Code Group Number Group Name  [facility] SUREST/BIND-CHOICE PLUS [id number]   Primary Subscriber   ID Name [facility] Address  [id number] [facility] [id number] [address]  [place], [place] [address]

## 2021-09-27 ENCOUNTER — Encounter: Payer: Self-pay | Admitting: Psychiatry

## 2021-09-27 ENCOUNTER — Ambulatory Visit: Payer: No Typology Code available for payment source | Admitting: Psychiatry

## 2021-09-27 DIAGNOSIS — F411 Generalized anxiety disorder: Secondary | ICD-10-CM | POA: Diagnosis not present

## 2021-09-27 DIAGNOSIS — G4700 Insomnia, unspecified: Secondary | ICD-10-CM | POA: Diagnosis not present

## 2021-09-27 DIAGNOSIS — F4310 Post-traumatic stress disorder, unspecified: Secondary | ICD-10-CM

## 2021-09-27 MED ORDER — GABAPENTIN 300 MG PO CAPS: ORAL_CAPSULE | ORAL | 1 refills | Status: DC

## 2021-09-27 MED ORDER — GABAPENTIN 100 MG PO CAPS: 100.0000 mg | ORAL_CAPSULE | Freq: Two times a day (BID) | ORAL | 0 refills | Status: DC

## 2021-09-27 MED ORDER — DAYVIGO 10 MG PO TABS: 10.0000 mg | ORAL_TABLET | Freq: Every day | ORAL | 1 refills | Status: DC

## 2021-09-27 MED ORDER — ALPRAZOLAM 0.5 MG PO TABS: ORAL_TABLET | ORAL | 0 refills | Status: DC
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
## 2021-10-01 ENCOUNTER — Ambulatory Visit: Payer: No Typology Code available for payment source | Admitting: Psychiatry

## 2021-10-01 ENCOUNTER — Ambulatory Visit (HOSPITAL_COMMUNITY)
Admission: EM | Admit: 2021-10-01 | Discharge: 2021-10-01 | Disposition: A | Discharge status: Home / Self Care | Payer: No Typology Code available for payment source

## 2021-10-01 DIAGNOSIS — R4589 Other symptoms and signs involving emotional state: Secondary | ICD-10-CM

## 2021-10-01 DIAGNOSIS — R45851 Suicidal ideations: Secondary | ICD-10-CM | POA: Diagnosis not present

## 2021-10-01 DIAGNOSIS — G4700 Insomnia, unspecified: Secondary | ICD-10-CM | POA: Diagnosis not present

## 2021-10-01 DIAGNOSIS — F339 Major depressive disorder, recurrent, unspecified: Secondary | ICD-10-CM | POA: Diagnosis not present

## 2021-10-01 NOTE — Discharge Instructions (Addendum)
F/u with schedule appointment with [facility] also can follow up with walk-in Psychiatry
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
## 2021-10-09 ENCOUNTER — Telehealth: Payer: Self-pay | Admitting: Psychiatry

## 2021-10-09 NOTE — Telephone Encounter (Signed)
Next visit is 11/01/21. [name]'s son [name] called and she gave permission to speak to him. [name] is at [facility]. The doctor who is seeing her there took her off of Lorazepam. Today she lost her balance with her son and daughter-in law present. She had to hold onto something to prevent falling. She is currently taking Benzidine for migraines and has been having extreme fatigue. She also takes Gabapentin, Xanax and Vybrid. Her son and [name] want to know if any of these medications could be causing the above symptoms? Her phone number is [phone].

## 2021-10-09 NOTE — Telephone Encounter (Signed)
Please advise 

## 2021-10-10 NOTE — Telephone Encounter (Signed)
LVM to [facility] 

## 2021-10-10 NOTE — Telephone Encounter (Signed)
Please let them know that Gabapentin can cause people to feel unsteady and sometimes people describe it as feeling like they are on a boat. It's possible it could be Gabapentin since this was increased recently. Baclofen could also cause/contribute to unsteadiness, however I think she has been taking this awhile so it is more likely the Gabapentin. Also, she was supposed to stop Lorazepam with the switch to Xanax since these are similar meds, so if she was taking both meds that could have also caused drowsiness and unsteadiness. Recommend talking to [facility] provider at [facility] about these side effects since they are currently managing her meds.

## 2021-10-11 NOTE — Telephone Encounter (Signed)
Pt informed

## 2021-11-01 ENCOUNTER — Ambulatory Visit: Payer: No Typology Code available for payment source | Admitting: Psychiatry

## 2021-11-17 ENCOUNTER — Other Ambulatory Visit: Payer: Self-pay | Admitting: Psychiatry

## 2021-11-17 DIAGNOSIS — F32A Depression, unspecified: Secondary | ICD-10-CM

## 2021-11-17 DIAGNOSIS — F411 Generalized anxiety disorder: Secondary | ICD-10-CM

## 2021-12-03 ENCOUNTER — Encounter: Payer: Self-pay | Admitting: Psychiatry

## 2021-12-03 ENCOUNTER — Ambulatory Visit: Payer: No Typology Code available for payment source | Admitting: Psychiatry

## 2021-12-03 DIAGNOSIS — F411 Generalized anxiety disorder: Secondary | ICD-10-CM | POA: Diagnosis not present

## 2021-12-03 DIAGNOSIS — F4310 Post-traumatic stress disorder, unspecified: Secondary | ICD-10-CM | POA: Diagnosis not present

## 2021-12-03 DIAGNOSIS — F32A Depression, unspecified: Secondary | ICD-10-CM | POA: Diagnosis not present

## 2021-12-03 DIAGNOSIS — G4700 Insomnia, unspecified: Secondary | ICD-10-CM

## 2021-12-03 MED ORDER — ALPRAZOLAM 0.5 MG PO TABS: ORAL_TABLET | ORAL | 0 refills | Status: DC

## 2021-12-03 MED ORDER — VILAZODONE HCL 10 MG PO TABS: 15.0000 mg | ORAL_TABLET | Freq: Every day | ORAL | 1 refills | Status: DC

## 2021-12-03 MED ORDER — DOXAZOSIN MESYLATE 4 MG PO TABS: 4.0000 mg | ORAL_TABLET | Freq: Every day | ORAL | 1 refills | Status: DC

## 2021-12-03 MED ORDER — GABAPENTIN 300 MG PO CAPS: 300.0000 mg | ORAL_CAPSULE | Freq: Every day | ORAL | 0 refills | Status: DC

## 2021-12-03 MED ORDER — GABAPENTIN 100 MG PO CAPS: 100.0000 mg | ORAL_CAPSULE | Freq: Two times a day (BID) | ORAL | 0 refills | Status: DC
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
## 2021-12-05 ENCOUNTER — Other Ambulatory Visit: Payer: Self-pay | Admitting: Neurological Surgery

## 2021-12-20 NOTE — Pre-Procedure Instructions (Signed)
Surgical Instructions    Your procedure is scheduled on Thursday, December 27, 2021 at 11:00 AM.  Report to [facility] Main Entrance "A" at 9:00 A.M., then check in with the Admitting office.  Call this number if you have problems the morning of surgery:  [phone]   If you have any questions prior to your surgery date call [phone]: Open Monday-Friday 8am-4pm  *If you experience any cold or flu symptoms such as cough, fever, chills, shortness of breath, etc. between now and your scheduled surgery, please notify us.*    Remember:  Do not eat after midnight the night before your surgery  You may drink clear liquids until 8:00 AM the morning of your surgery.   Clear liquids allowed are: Water, Non-Citrus Juices (without pulp), Carbonated Beverages, Clear Tea, Black Coffee Only (NO MILK, CREAM OR POWDERED CREAMER of any kind), and Gatorade.    Take these medicines the morning of surgery with A SIP OF WATER:  dextromethorphan-guaiFENesin (MUCINEX DM)  fluticasone (FLONASE)  loratadine (CLARITIN)  rosuvastatin (CRESTOR)  thyroid (ARMOUR)  Vilazodone HCl (VIIBRYD)   IF NEEDED: acetaminophen (TYLENOL)  ALPRAZolam (XANAX)  gabapentin (NEURONTIN)  rizatriptan (MAXALT-MLT)  Simethicone (GAS-X PO)  Tetrahydrozoline HCl (VISINE OP)  tiZANidine (ZANAFLEX)  UBRELVY    As of today, STOP taking any Aspirin (unless otherwise instructed by your surgeon) Aleve, Naproxen, Ibuprofen, Motrin, Advil, Goody's, BC's, all herbal medications, fish oil, and all vitamins. This includes diclofenac sodium (VOLTAREN) 1 % GEL.  WHAT DO I DO ABOUT MY DIABETES MEDICATION?   Stop taking JARDIANCE 72 hours prior to surgery. Your last dose will be on 12/23/21.   HOW TO MANAGE YOUR DIABETES BEFORE AND AFTER SURGERY  Why is it important to control my blood sugar before and after surgery? Improving blood sugar levels before and after surgery helps healing and can limit problems. A way of improving  blood sugar control is eating a healthy diet by:  Eating less sugar and carbohydrates  Increasing activity/exercise  Talking with your doctor about reaching your blood sugar goals High blood sugars (greater than 180 mg/dL) can raise your risk of infections and slow your recovery, so you will need to focus on controlling your diabetes during the weeks before surgery. Make sure that the doctor who takes care of your diabetes knows about your planned surgery including the date and location.  How do I manage my blood sugar before surgery? Check your blood sugar at least 4 times a day, starting 2 days before surgery, to make sure that the level is not too high or low.  Check your blood sugar the morning of your surgery when you wake up and every 2 hours until you get to the Short Stay unit.  If your blood sugar is less than 70 mg/dL, you will need to treat for low blood sugar: Do not take insulin. Treat a low blood sugar (less than 70 mg/dL) with  cup of clear juice (cranberry or apple), 4 glucose tablets, OR glucose gel. Recheck blood sugar in 15 minutes after treatment (to make sure it is greater than 70 mg/dL). If your blood sugar is not greater than 70 mg/dL on recheck, call [phone] for further instructions. Report your blood sugar to the short stay nurse when you get to Short Stay.  If you are admitted to the hospital after surgery: Your blood sugar will be checked by the staff and you will probably be given insulin after surgery (instead of oral diabetes medicines) to  make sure you have good blood sugar levels. The goal for blood sugar control after surgery is 80-180 mg/dL.                      Do NOT Smoke (Tobacco/Vaping) for 24 hours prior to your procedure.  If you use a CPAP at night, you may bring your mask/headgear for your overnight stay.   Contacts, glasses, piercing's, hearing aid's, dentures or partials may not be worn into surgery, please bring cases for these  belongings.    For patients admitted to the hospital, discharge time will be determined by your treatment team.   Patients discharged the day of surgery will not be allowed to drive home, and someone needs to stay with them for 24 hours.  SURGICAL WAITING ROOM VISITATION Patients having surgery or a procedure may have two support people in the waiting area. Visitors may stay in the waiting area during the procedure and switch out with other visitors if needed. Children under the age of 38 must have an adult accompany them who is not the patient. If the patient needs to stay at the hospital during part of their recovery, the visitor guidelines for inpatient rooms apply.  Please refer to the [facility] website for the visitor guidelines for Inpatients (after your surgery is over and you are in a regular room).    Special instructions:   [place]- Preparing For Surgery  Before surgery, you can play an important role. Because skin is not sterile, your skin needs to be as free of germs as possible. You can reduce the number of germs on your skin by washing with CHG (chlorahexidine gluconate) Soap before surgery.  CHG is an antiseptic cleaner which kills germs and bonds with the skin to continue killing germs even after washing.    Oral Hygiene is also important to reduce your risk of infection.  Remember - BRUSH YOUR TEETH THE MORNING OF SURGERY WITH YOUR REGULAR TOOTHPASTE  Please do not use if you have an allergy to CHG or antibacterial soaps. If your skin becomes reddened/irritated stop using the CHG.  Do not shave (including legs and underarms) for at least 48 hours prior to first CHG shower. It is OK to shave your face.  Please follow these instructions carefully.   Shower the NIGHT BEFORE SURGERY and the MORNING OF SURGERY  If you chose to wash your hair, wash your hair first as usual with your normal shampoo.  After you shampoo, rinse your hair and body thoroughly to remove the  shampoo.  Use CHG Soap as you would any other liquid soap. You can apply CHG directly to the skin and wash gently with a scrungie or a clean washcloth.   Apply the CHG Soap to your body ONLY FROM THE NECK DOWN.  Do not use on open wounds or open sores. Avoid contact with your eyes, ears, mouth and genitals (private parts). Wash Face and genitals (private parts)  with your normal soap.   Wash thoroughly, paying special attention to the area where your surgery will be performed.  Thoroughly rinse your body with warm water from the neck down.  DO NOT shower/wash with your normal soap after using and rinsing off the CHG Soap.  Pat yourself dry with a CLEAN TOWEL.  Wear CLEAN PAJAMAS to bed the night before surgery  Place CLEAN SHEETS on your bed the night before your surgery  DO NOT SLEEP WITH PETS.   Day of Surgery:  Take a shower with CHG soap. Do not wear jewelry or makeup Do not wear lotions, powders, perfumes/colognes, or deodorant. Do not shave 48 hours prior to surgery. Do not wear nail polish, gel polish, artificial nails, or any other type of covering on natural nails (fingers and toes) If you have artificial nails or gel coating that need to be removed by a nail salon, please have this removed prior to surgery. Artificial nails or gel coating may interfere with anesthesia's ability to adequately monitor your vital signs. Wear Clean/Comfortable clothing the morning of surgery Do not bring valuables to the hospital.  [facility] is not responsible for any belongings or valuables. Remember to brush your teeth WITH YOUR REGULAR TOOTHPASTE.   Please read over the following fact sheets that you were given.  If you received a COVID test during your pre-op visit  it is requested that you wear a mask when out in public, stay away from anyone that may not be feeling well and notify your surgeon if you develop symptoms. If you have been in contact with anyone that has tested positive in  the last 10 days please notify you surgeon.

## 2021-12-21 ENCOUNTER — Other Ambulatory Visit: Payer: Self-pay

## 2021-12-21 ENCOUNTER — Encounter (HOSPITAL_COMMUNITY): Payer: Self-pay

## 2021-12-21 ENCOUNTER — Encounter (HOSPITAL_COMMUNITY)
Admission: RE | Admit: 2021-12-21 | Discharge: 2021-12-21 | Disposition: A | Discharge status: Home / Self Care | Payer: No Typology Code available for payment source | Source: Ambulatory Visit | Attending: Neurological Surgery | Admitting: Neurological Surgery

## 2021-12-21 VITALS — BP 145/77 | HR 88 | Temp 98.0°F | Resp 18 | Ht 62.0 in | Wt 228.0 lb

## 2021-12-21 DIAGNOSIS — Z01818 Encounter for other preprocedural examination: Secondary | ICD-10-CM

## 2021-12-21 DIAGNOSIS — I10 Essential (primary) hypertension: Secondary | ICD-10-CM | POA: Insufficient documentation

## 2021-12-21 LAB — TYPE AND SCREEN
ABO/RH(D): O POS
Antibody Screen: NEGATIVE

## 2021-12-21 LAB — GLUCOSE, CAPILLARY: Glucose-Capillary: 173 mg/dL — ABNORMAL HIGH (ref 70–99)

## 2021-12-21 LAB — BASIC METABOLIC PANEL
Anion gap: 14 (ref 5–15)
BUN: 11 mg/dL (ref 6–20)
CO2: 24 mmol/L (ref 22–32)
Calcium: 9.9 mg/dL (ref 8.9–10.3)
Chloride: 103 mmol/L (ref 98–111)
Creatinine, Ser: 0.69 mg/dL (ref 0.44–1.00)
GFR, Estimated: >60 mL/min (ref >60)
Glucose, Bld: 168 mg/dL — ABNORMAL HIGH (ref 70–99)
Potassium: 3.3 mmol/L — ABNORMAL LOW (ref 3.5–5.1)
Sodium: 141 mmol/L (ref 135–145)

## 2021-12-21 LAB — CBC
HCT: 45.6 % (ref 36.0–46.0)
Hemoglobin: 14.9 g/dL (ref 12.0–15.0)
MCH: 29.2 pg (ref 26.0–34.0)
MCHC: 32.7 g/dL (ref 30.0–36.0)
MCV: 89.2 fL (ref 80.0–100.0)
Platelets: 270 10*3/uL (ref 150–400)
RBC: 5.11 MIL/uL (ref 3.87–5.11)
RDW: 12.9 % (ref 11.5–15.5)
WBC: 7.4 10*3/uL (ref 4.0–10.5)
nRBC: 0 % (ref 0.0–0.2)

## 2021-12-21 LAB — SURGICAL PCR SCREEN
MRSA, PCR: NEGATIVE
Staphylococcus aureus: NEGATIVE

## 2021-12-21 NOTE — Progress Notes (Signed)
PCP - Dr. Shon Baton Cardiologist - Denies  PPM/ICD - Denies  Chest x-ray - N/A EKG - 12/21/21 Stress Test - Denies ECHO - 12/02/19 Cardiac Cath - Denies  Sleep Study - OSA CPAP - Yes  Fasting Blood Sugar - 100 Checks Blood Sugar only with fasting.   Blood Thinner Instructions: N/A Aspirin Instructions: N/A  ERAS Protcol - Yes PRE-SURGERY Ensure or G2- No  COVID TEST- N/A   Anesthesia review: No  Patient denies shortness of breath, fever, cough and chest pain at PAT appointment   All instructions explained to the patient, with a verbal understanding of the material. Patient agrees to go over the instructions while at home for a better understanding. Patient also instructed to self quarantine after being tested for COVID-19. The opportunity to ask questions was provided.

## 2021-12-24 ENCOUNTER — Ambulatory Visit: Payer: No Typology Code available for payment source | Admitting: Psychiatry

## 2021-12-24 ENCOUNTER — Encounter: Payer: Self-pay | Admitting: Psychiatry

## 2021-12-24 DIAGNOSIS — F32A Depression, unspecified: Secondary | ICD-10-CM | POA: Diagnosis not present

## 2021-12-24 DIAGNOSIS — F4310 Post-traumatic stress disorder, unspecified: Secondary | ICD-10-CM

## 2021-12-24 DIAGNOSIS — F411 Generalized anxiety disorder: Secondary | ICD-10-CM | POA: Diagnosis not present

## 2021-12-24 DIAGNOSIS — G4700 Insomnia, unspecified: Secondary | ICD-10-CM

## 2021-12-24 MED ORDER — GABAPENTIN 300 MG PO CAPS: 300.0000 mg | ORAL_CAPSULE | Freq: Every day | ORAL | 0 refills | Status: DC

## 2021-12-24 MED ORDER — ALPRAZOLAM 0.5 MG PO TABS: ORAL_TABLET | ORAL | 2 refills | Status: DC

## 2021-12-24 MED ORDER — VILAZODONE HCL 10 MG PO TABS: 15.0000 mg | ORAL_TABLET | Freq: Every day | ORAL | 0 refills | Status: DC

## 2021-12-24 MED ORDER — GABAPENTIN 100 MG PO CAPS: 100.0000 mg | ORAL_CAPSULE | Freq: Two times a day (BID) | ORAL | 2 refills | Status: DC | PRN

## 2021-12-24 NOTE — Progress Notes (Unsigned)
[name] [id number] 1960/04/06 61 y.o.  Subjective:   Patient ID:  [name] is a 61 y.o. (DOB 11-22-1960) female.  Chief Complaint:  Chief Complaint  Patient presents with   Follow-up    Anxiety, depression, insomnia    HPI [name] presents to the office today for follow-up of depression, anxiety, and insomnia. She reports increase in Viibryd seems to be helpful. She reports that she is crying less. She has been setting appropriate boundaries. She is able to sleep without difficulty. She has been taking Gabapentin 100 mg in the morning and then as needed to help with anxiety. She reports panic attacks have improved. Has some panic s/s with acute stressors after difficult interactions. Energy and motivation have been low. She reports that she has lost about 25 lbs. Her appetite has been less and that "things don't taste right." She reports that stress eating and migraines have improved since husband has moved out. Concentration has been good. Denies recent nightmares. Denies SI.   She is having spinal surgery later this week. She has assistive devices at home in preparation for post-operative recovery.   Has been seeing Beckey Downing, LCAS regularly. She reports that in past unhealthy relationships she has had to Justify, Argue, Defend, and Explain (JADE).  She and her husband remain separated. Recently told good friend about her marital separation and friend has offered support.   Taking Xanax at night and occasionally during the day.  Gabapentin 100 mg and 300 mg last filled 12/03/21 Alprazolam 11/13 Dayvigo last filled 10/08/21   Past Psychiatric Medication Trials: Ambien- Has been taking 5-10 mg Intermezzo- Helpful for sleep initiation Xanax Effexor XR-Multiple adverse effects. Severe discontinuation s/s. Zonisamide- SI. Lexapro-Concentration difficulty Viibryd Elavil- Apatheric, difficulty with work Topamax- Anger, irritability Benadryl- partially  effective.  Dayvigo- Effective. Has mild drowsiness the following day.  Doxepin- Excessive sedation Doxazosin Gabapentin    Flowsheet Row Pre-Admission Testing 60 from 12/21/2021 in [facility] PREADMISSION TESTING  C-SSRS RISK CATEGORY No Risk        Review of Systems:  Review of Systems  Gastrointestinal: Negative.   Musculoskeletal:  Positive for arthralgias and back pain. Negative for gait problem.  Neurological:        Improved headaches/migraines  Psychiatric/Behavioral:         Please refer to HPI    Medications: I have reviewed the patient's current medications.  Current Outpatient Medications  Medication Sig Dispense Refill   gabapentin (NEURONTIN) 100 MG capsule Take 1 capsule (100 mg total) by mouth 2 (two) times daily. (Patient taking differently: Take 100 mg by mouth 2 (two) times daily as needed (pain).) 60 capsule 0   acetaminophen (TYLENOL) 500 MG tablet Take 1,000 mg by mouth every 6 (six) hours as needed for moderate pain or headache.     ALPRAZolam (XANAX) 0.5 MG tablet Take 1/2-1 tablet three times daily as needed for anxiety or insomnia (Patient taking differently: Take 0.5-1 mg by mouth 2 (two) times daily as needed for anxiety or sleep.) 75 tablet 0   calcium carbonate (TUMS - DOSED IN MG ELEMENTAL CALCIUM) 500 MG chewable tablet Chew 3 tablets by mouth daily as needed for indigestion or heartburn.     dextromethorphan (DELSYM) 30 MG/5ML liquid Take 60 mg by mouth 2 (two) times daily as needed for cough.     dextromethorphan-guaiFENesin (MUCINEX DM) 30-600 MG 12hr tablet Take 2 tablets by mouth 2 (two) times daily.     diclofenac  sodium (VOLTAREN) 1 % GEL Apply 2 g topically in the morning and at bedtime.     doxazosin (CARDURA) 4 MG tablet Take 1 tablet (4 mg total) by mouth at bedtime. 90 tablet 1   fluticasone (FLONASE) 50 MCG/ACT nasal spray Place 1-2 sprays into both nostrils daily.     gabapentin (NEURONTIN) 300 MG capsule Take 1  capsule (300 mg total) by mouth at bedtime. 90 capsule 0   Galcanezumab-gnlm 120 MG/ML SOAJ Inject 120 mg into the skin every 30 (thirty) days.     hydrochlorothiazide (MICROZIDE) 12.5 MG capsule Take 12.5 mg by mouth daily.     ibuprofen (ADVIL) 200 MG tablet Take 1 tablet (200 mg total) by mouth every 6 (six) hours as needed. (Patient not taking: Reported on 12/20/2021) 30 tablet 0   JARDIANCE 10 MG TABS tablet Take 10 mg by mouth every morning.     Lemborexant (DAYVIGO) 10 MG TABS Take 10 mg by mouth at bedtime. 90 tablet 1   Lidocaine 0.5 % AERO Place 1 spray into both nostrils See admin instructions. CAN ONLY TAKE ONCE WEEKLY FOR MIGRAINES     loratadine (CLARITIN) 10 MG tablet Take 10 mg by mouth daily.     metFORMIN (GLUCOPHAGE) 500 MG tablet Take 500 mg by mouth daily with supper.     neomycin-polymyxin-hydrocortisone (CORTISPORIN) OTIC solution Place 4 drops into the right ear in the morning, at noon, and at bedtime.     OnabotulinumtoxinA (BOTOX IM) Inject into the muscle every 3 (three) months. FOR MIGRAINES     rizatriptan (MAXALT-MLT) 10 MG disintegrating tablet Take 10 mg by mouth as needed for migraine.      rosuvastatin (CRESTOR) 10 MG tablet Take 10 mg by mouth daily.     Simethicone (GAS-X PO) Take 1 capsule by mouth daily as needed (gas).     Tetrahydrozoline HCl (VISINE OP) Place 1 drop into both eyes daily as needed (irritation).     thyroid (ARMOUR) 30 MG tablet Take 30 mg by mouth daily before breakfast.      tiZANidine (ZANAFLEX) 4 MG tablet Take 4 mg by mouth daily as needed (MIGRAINES).     TOPROL XL 100 MG 24 hr tablet Take 100 mg by mouth at bedtime.     UBRELVY 50 MG TABS Take 50 mg by mouth daily as needed (migraine).     Vilazodone HCl (VIIBRYD) 10 MG TABS Take 1.5 tablets (15 mg total) by mouth daily. 45 tablet 1   No current facility-administered medications for this visit.   Facility-Administered Medications Ordered in Other Visits  Medication Dose Route  Frequency Provider Last Rate Last Admin   ondansetron (ZOFRAN) 4 mg in sodium chloride 0.9 % 50 mL IVPB  4 mg Intravenous Q6H PRN Kristeen Miss, MD        Medication Side Effects: None  Allergies:  Allergies  Allergen Reactions   Effexor [Venlafaxine Hcl]     Weight loss and cant sleep   Flexeril [Cyclobenzaprine] Hives    Past Medical History:  Diagnosis Date   Back pain of lumbar region with sciatica    Cancer ([place])    breast   Diabetes mellitus, type II ([place])    Family history of anesthesia complication    grandfather died under anesthesia 70's- had heart issues   H/O carpal tunnel repair 2014   Headache(784.0)    Hyperlipidemia    Hypertension    Hypothyroidism    PONV (postoperative nausea and vomiting)  Sleep apnea    cpap since 10    Past Medical History, Surgical history, Social history, and Family history were reviewed and updated as appropriate.   Please see review of systems for further details on the patient's review from today.   Objective:   Physical Exam:  LMP 11/12/2013   Physical Exam  Lab Review:     Component Value Date/Time   NA 141 12/21/2021 1550   K 3.3 (L) 12/21/2021 1550   CL 103 12/21/2021 1550   CO2 24 12/21/2021 1550   GLUCOSE 168 (H) 12/21/2021 1550   BUN 11 12/21/2021 1550   CREATININE 0.69 12/21/2021 1550   CREATININE 0.73 01/19/2018 1556   CALCIUM 9.9 12/21/2021 1550   PROT 6.8 01/19/2018 1556   ALBUMIN 3.6 01/19/2018 1556   AST 15 01/19/2018 1556   ALT 15 01/19/2018 1556   ALKPHOS 53 01/19/2018 1556   BILITOT 0.3 01/19/2018 1556   GFRNONAA >60 12/21/2021 1550   GFRNONAA >60 01/19/2018 1556   GFRAA >60 01/19/2018 1556       Component Value Date/Time   WBC 7.4 12/21/2021 1550   RBC 5.11 12/21/2021 1550   HGB 14.9 12/21/2021 1550   HGB 12.2 01/19/2018 1556   HCT 45.6 12/21/2021 1550   PLT 270 12/21/2021 1550   PLT 263 01/19/2018 1556   MCV 89.2 12/21/2021 1550   MCH 29.2 12/21/2021 1550   MCHC 32.7 12/21/2021  1550   RDW 12.9 12/21/2021 1550   LYMPHSABS 2.3 01/19/2018 1556   MONOABS 0.6 01/19/2018 1556   EOSABS 0.6 (H) 01/19/2018 1556   BASOSABS 0.1 01/19/2018 1556    No results found for: "POCLITH", "LITHIUM"   No results found for: "PHENYTOIN", "PHENOBARB", "VALPROATE", "CBMZ"   .res Assessment: Plan:    There are no diagnoses linked to this encounter.   Please see After Visit Summary for patient specific instructions.  Future Appointments  Date Time Provider [place]  03/22/2022  3:00 PM Jaquita Folds, MD [facility]  06/12/2022 11:00 AM Ward Givens, NP [facility] None    No orders of the defined types were placed in this encounter.   -------------------------------

## 2021-12-27 ENCOUNTER — Inpatient Hospital Stay (HOSPITAL_COMMUNITY)
Admission: RE | Disposition: A | Discharge status: Home / Self Care | Payer: Self-pay | Source: Home / Self Care | Attending: Neurological Surgery

## 2021-12-27 ENCOUNTER — Other Ambulatory Visit: Payer: Self-pay

## 2021-12-27 ENCOUNTER — Inpatient Hospital Stay (HOSPITAL_COMMUNITY): Payer: No Typology Code available for payment source | Admitting: Certified Registered Nurse Anesthetist

## 2021-12-27 ENCOUNTER — Inpatient Hospital Stay (HOSPITAL_COMMUNITY): Payer: No Typology Code available for payment source

## 2021-12-27 ENCOUNTER — Inpatient Hospital Stay (HOSPITAL_COMMUNITY)
Admission: RE | Admit: 2021-12-27 | Discharge: 2021-12-31 | DRG: 455 | Disposition: A | Discharge status: Home / Self Care | Payer: No Typology Code available for payment source | Attending: Neurological Surgery | Admitting: Neurological Surgery

## 2021-12-27 ENCOUNTER — Encounter (HOSPITAL_COMMUNITY): Payer: Self-pay | Admitting: Neurological Surgery

## 2021-12-27 DIAGNOSIS — M48062 Spinal stenosis, lumbar region with neurogenic claudication: Secondary | ICD-10-CM | POA: Diagnosis present

## 2021-12-27 DIAGNOSIS — E039 Hypothyroidism, unspecified: Secondary | ICD-10-CM | POA: Diagnosis present

## 2021-12-27 DIAGNOSIS — Z981 Arthrodesis status: Secondary | ICD-10-CM

## 2021-12-27 DIAGNOSIS — L98491 Non-pressure chronic ulcer of skin of other sites limited to breakdown of skin: Secondary | ICD-10-CM | POA: Diagnosis present

## 2021-12-27 DIAGNOSIS — I10 Essential (primary) hypertension: Secondary | ICD-10-CM | POA: Diagnosis present

## 2021-12-27 DIAGNOSIS — M5416 Radiculopathy, lumbar region: Secondary | ICD-10-CM | POA: Diagnosis present

## 2021-12-27 DIAGNOSIS — E785 Hyperlipidemia, unspecified: Secondary | ICD-10-CM | POA: Diagnosis present

## 2021-12-27 DIAGNOSIS — M4316 Spondylolisthesis, lumbar region: Secondary | ICD-10-CM

## 2021-12-27 DIAGNOSIS — M8580 Other specified disorders of bone density and structure, unspecified site: Secondary | ICD-10-CM | POA: Diagnosis present

## 2021-12-27 DIAGNOSIS — Z7984 Long term (current) use of oral hypoglycemic drugs: Secondary | ICD-10-CM | POA: Diagnosis not present

## 2021-12-27 DIAGNOSIS — Z83438 Family history of other disorder of lipoprotein metabolism and other lipidemia: Secondary | ICD-10-CM

## 2021-12-27 DIAGNOSIS — M4802 Spinal stenosis, cervical region: Secondary | ICD-10-CM | POA: Diagnosis present

## 2021-12-27 DIAGNOSIS — M40295 Other kyphosis, thoracolumbar region: Secondary | ICD-10-CM | POA: Diagnosis present

## 2021-12-27 DIAGNOSIS — E119 Type 2 diabetes mellitus without complications: Secondary | ICD-10-CM | POA: Diagnosis present

## 2021-12-27 DIAGNOSIS — Z833 Family history of diabetes mellitus: Secondary | ICD-10-CM | POA: Diagnosis not present

## 2021-12-27 DIAGNOSIS — Z803 Family history of malignant neoplasm of breast: Secondary | ICD-10-CM | POA: Diagnosis not present

## 2021-12-27 DIAGNOSIS — G4730 Sleep apnea, unspecified: Secondary | ICD-10-CM | POA: Diagnosis present

## 2021-12-27 DIAGNOSIS — Z79899 Other long term (current) drug therapy: Secondary | ICD-10-CM

## 2021-12-27 DIAGNOSIS — Z8249 Family history of ischemic heart disease and other diseases of the circulatory system: Secondary | ICD-10-CM

## 2021-12-27 DIAGNOSIS — Z888 Allergy status to other drugs, medicaments and biological substances status: Secondary | ICD-10-CM | POA: Diagnosis not present

## 2021-12-27 LAB — ABO/RH: ABO/RH(D): O POS

## 2021-12-27 LAB — GLUCOSE, CAPILLARY
Glucose-Capillary: 156 mg/dL — ABNORMAL HIGH (ref 70–99)
Glucose-Capillary: 116 mg/dL — ABNORMAL HIGH (ref 70–99)
Glucose-Capillary: 160 mg/dL — ABNORMAL HIGH (ref 70–99)
Glucose-Capillary: 175 mg/dL — ABNORMAL HIGH (ref 70–99)
Glucose-Capillary: 247 mg/dL — ABNORMAL HIGH (ref 70–99)

## 2021-12-27 SURGERY — POSTERIOR LUMBAR FUSION 2 LEVEL
Anesthesia: General | Site: Back

## 2021-12-27 MED ORDER — INSULIN ASPART 100 UNIT/ML IJ SOLN
0.0000 [IU] | Freq: Every day | INTRAMUSCULAR | Status: DC
  Administered 2021-12-27: 2 [IU] via SUBCUTANEOUS

## 2021-12-27 MED ORDER — KETAMINE HCL 50 MG/5ML IJ SOSY
PREFILLED_SYRINGE | INTRAMUSCULAR | Status: AC
  Filled 2021-12-27: qty 5

## 2021-12-27 MED ORDER — SUMATRIPTAN SUCCINATE 100 MG PO TABS: 100.0000 mg | ORAL_TABLET | ORAL | Status: DC | PRN

## 2021-12-27 MED ORDER — LORATADINE 10 MG PO TABS
10.0000 mg | ORAL_TABLET | Freq: Every day | ORAL | Status: DC
  Administered 2021-12-28 – 2021-12-31 (×4): 10 mg via ORAL
  Filled 2021-12-27 (×4): qty 1

## 2021-12-27 MED ORDER — KETOROLAC TROMETHAMINE 15 MG/ML IJ SOLN
15.0000 mg | Freq: Once | INTRAMUSCULAR | Status: AC
  Administered 2021-12-27: 15 mg via INTRAVENOUS

## 2021-12-27 MED ORDER — INSULIN ASPART 100 UNIT/ML IJ SOLN
0.0000 [IU] | Freq: Three times a day (TID) | INTRAMUSCULAR | Status: DC
  Administered 2021-12-28 – 2021-12-29 (×2): 3 [IU] via SUBCUTANEOUS
  Administered 2021-12-29: 4 [IU] via SUBCUTANEOUS

## 2021-12-27 MED ORDER — ACETAMINOPHEN 500 MG PO TABS
1000.0000 mg | ORAL_TABLET | Freq: Once | ORAL | Status: AC
  Administered 2021-12-27: 1000 mg via ORAL
  Filled 2021-12-27: qty 2

## 2021-12-27 MED ORDER — DOXAZOSIN MESYLATE 4 MG PO TABS
4.0000 mg | ORAL_TABLET | Freq: Every day | ORAL | Status: DC
  Administered 2021-12-27 – 2021-12-30 (×4): 4 mg via ORAL
  Filled 2021-12-27 (×6): qty 1

## 2021-12-27 MED ORDER — SODIUM CHLORIDE 0.9% FLUSH: 3.0000 mL | INTRAVENOUS | Status: DC | PRN

## 2021-12-27 MED ORDER — LIDOCAINE 2% (20 MG/ML) 5 ML SYRINGE
INTRAMUSCULAR | Status: AC
  Filled 2021-12-27: qty 10

## 2021-12-27 MED ORDER — BUPIVACAINE HCL (PF) 0.5 % IJ SOLN
INTRAMUSCULAR | Status: AC
  Filled 2021-12-27: qty 30

## 2021-12-27 MED ORDER — THROMBIN 5000 UNITS EX SOLR
CUTANEOUS | Status: AC
  Filled 2021-12-27: qty 5000

## 2021-12-27 MED ORDER — ONDANSETRON HCL 4 MG/2ML IJ SOLN: 4.0000 mg | Freq: Four times a day (QID) | INTRAMUSCULAR | Status: DC | PRN

## 2021-12-27 MED ORDER — DEXAMETHASONE SODIUM PHOSPHATE 10 MG/ML IJ SOLN
INTRAMUSCULAR | Status: DC | PRN
  Administered 2021-12-27: 5 mg via INTRAVENOUS

## 2021-12-27 MED ORDER — PHENYLEPHRINE HCL-NACL 20-0.9 MG/250ML-% IV SOLN
INTRAVENOUS | Status: DC | PRN
  Administered 2021-12-27: 20 ug/min via INTRAVENOUS

## 2021-12-27 MED ORDER — AMISULPRIDE (ANTIEMETIC) 5 MG/2ML IV SOLN: 10.0000 mg | Freq: Once | INTRAVENOUS | Status: DC | PRN

## 2021-12-27 MED ORDER — ACETAMINOPHEN 10 MG/ML IV SOLN
INTRAVENOUS | Status: DC | PRN
  Administered 2021-12-27: 1000 mg via INTRAVENOUS

## 2021-12-27 MED ORDER — ORAL CARE MOUTH RINSE: 15.0000 mL | Freq: Once | OROMUCOSAL | Status: AC

## 2021-12-27 MED ORDER — SIMETHICONE 80 MG PO CHEW: 80.0000 mg | CHEWABLE_TABLET | Freq: Every day | ORAL | Status: DC | PRN

## 2021-12-27 MED ORDER — MORPHINE SULFATE (PF) 2 MG/ML IV SOLN: 2.0000 mg | INTRAVENOUS | Status: DC | PRN

## 2021-12-27 MED ORDER — ACETAMINOPHEN 325 MG PO TABS: 650.0000 mg | ORAL_TABLET | ORAL | Status: DC | PRN

## 2021-12-27 MED ORDER — ACETAMINOPHEN 650 MG RE SUPP: 650.0000 mg | RECTAL | Status: DC | PRN

## 2021-12-27 MED ORDER — UBROGEPANT 50 MG PO TABS: 50.0000 mg | ORAL_TABLET | Freq: Every day | ORAL | Status: DC | PRN

## 2021-12-27 MED ORDER — INSULIN ASPART 100 UNIT/ML IJ SOLN
0.0000 [IU] | INTRAMUSCULAR | Status: AC | PRN
  Administered 2021-12-27 (×2): 2 [IU] via SUBCUTANEOUS
  Filled 2021-12-27: qty 1

## 2021-12-27 MED ORDER — VILAZODONE HCL 10 MG PO TABS
15.0000 mg | ORAL_TABLET | Freq: Every day | ORAL | Status: DC
  Filled 2021-12-27 (×2): qty 1.5

## 2021-12-27 MED ORDER — PHENOL 1.4 % MT LIQD: 1.0000 | OROMUCOSAL | Status: DC | PRN

## 2021-12-27 MED ORDER — SCOPOLAMINE 1 MG/3DAYS TD PT72
1.0000 | MEDICATED_PATCH | TRANSDERMAL | Status: DC
  Filled 2021-12-27: qty 1

## 2021-12-27 MED ORDER — MIDAZOLAM HCL 2 MG/2ML IJ SOLN
INTRAMUSCULAR | Status: DC | PRN
  Administered 2021-12-27: 2 mg via INTRAVENOUS

## 2021-12-27 MED ORDER — FENTANYL CITRATE (PF) 100 MCG/2ML IJ SOLN
INTRAMUSCULAR | Status: AC
  Filled 2021-12-27: qty 2

## 2021-12-27 MED ORDER — PROPOFOL 10 MG/ML IV BOLUS
INTRAVENOUS | Status: DC | PRN
  Administered 2021-12-27: 30 mg via INTRAVENOUS
  Administered 2021-12-27: 50 mg via INTRAVENOUS
  Administered 2021-12-27: 180 mg via INTRAVENOUS
  Administered 2021-12-27: 30 mg via INTRAVENOUS

## 2021-12-27 MED ORDER — ACETAMINOPHEN 10 MG/ML IV SOLN
INTRAVENOUS | Status: AC
  Filled 2021-12-27: qty 100

## 2021-12-27 MED ORDER — TETRAHYDROZOLINE HCL 0.05 % OP SOLN: 2.0000 [drp] | Freq: Every day | OPHTHALMIC | Status: DC | PRN

## 2021-12-27 MED ORDER — LIDOCAINE-EPINEPHRINE 1 %-1:100000 IJ SOLN
INTRAMUSCULAR | Status: AC
  Filled 2021-12-27: qty 1

## 2021-12-27 MED ORDER — ACETAMINOPHEN 500 MG PO TABS: 1000.0000 mg | ORAL_TABLET | Freq: Four times a day (QID) | ORAL | Status: DC | PRN

## 2021-12-27 MED ORDER — RIZATRIPTAN BENZOATE 10 MG PO TBDP: 10.0000 mg | ORAL_TABLET | ORAL | Status: DC | PRN

## 2021-12-27 MED ORDER — ROCURONIUM BROMIDE 10 MG/ML (PF) SYRINGE
PREFILLED_SYRINGE | INTRAVENOUS | Status: DC | PRN
  Administered 2021-12-27: 80 mg via INTRAVENOUS
  Administered 2021-12-27: 50 mg via INTRAVENOUS
  Administered 2021-12-27: 70 mg via INTRAVENOUS

## 2021-12-27 MED ORDER — SODIUM CHLORIDE 0.9% FLUSH
3.0000 mL | Freq: Two times a day (BID) | INTRAVENOUS | Status: DC
  Administered 2021-12-28 – 2021-12-31 (×7): 3 mL via INTRAVENOUS

## 2021-12-27 MED ORDER — ALPRAZOLAM 0.5 MG PO TABS
0.2500 mg | ORAL_TABLET | Freq: Every evening | ORAL | Status: DC | PRN
  Administered 2021-12-27 – 2021-12-30 (×4): 0.25 mg via ORAL
  Filled 2021-12-27 (×4): qty 1

## 2021-12-27 MED ORDER — KETAMINE HCL 10 MG/ML IJ SOLN
INTRAMUSCULAR | Status: DC | PRN
  Administered 2021-12-27 (×2): 10 mg via INTRAVENOUS
  Administered 2021-12-27: 30 mg via INTRAVENOUS

## 2021-12-27 MED ORDER — THROMBIN 5000 UNITS EX SOLR
OROMUCOSAL | Status: DC | PRN
  Administered 2021-12-27 (×2): 5 mL via TOPICAL

## 2021-12-27 MED ORDER — GABAPENTIN 100 MG PO CAPS
100.0000 mg | ORAL_CAPSULE | Freq: Two times a day (BID) | ORAL | Status: DC | PRN
  Administered 2021-12-31: 100 mg via ORAL
  Filled 2021-12-27: qty 1

## 2021-12-27 MED ORDER — 0.9 % SODIUM CHLORIDE (POUR BTL) OPTIME
TOPICAL | Status: DC | PRN
  Administered 2021-12-27: 1000 mL

## 2021-12-27 MED ORDER — LACTATED RINGERS IV SOLN: INTRAVENOUS | Status: DC

## 2021-12-27 MED ORDER — BISACODYL 10 MG RE SUPP: 10.0000 mg | Freq: Every day | RECTAL | Status: DC | PRN

## 2021-12-27 MED ORDER — ZOLPIDEM TARTRATE 5 MG PO TABS
5.0000 mg | ORAL_TABLET | Freq: Every evening | ORAL | Status: DC | PRN
  Administered 2021-12-27 – 2021-12-30 (×4): 5 mg via ORAL
  Filled 2021-12-27 (×4): qty 1

## 2021-12-27 MED ORDER — LIDOCAINE 2% (20 MG/ML) 5 ML SYRINGE
INTRAMUSCULAR | Status: DC | PRN
  Administered 2021-12-27: 80 mg via INTRAVENOUS

## 2021-12-27 MED ORDER — ONDANSETRON HCL 4 MG PO TABS: 4.0000 mg | ORAL_TABLET | Freq: Four times a day (QID) | ORAL | Status: DC | PRN

## 2021-12-27 MED ORDER — LIDOCAINE-EPINEPHRINE 1 %-1:100000 IJ SOLN
INTRAMUSCULAR | Status: DC | PRN
  Administered 2021-12-27: 5 mL

## 2021-12-27 MED ORDER — FLEET ENEMA 7-19 GM/118ML RE ENEM: 1.0000 | ENEMA | Freq: Once | RECTAL | Status: DC | PRN

## 2021-12-27 MED ORDER — MENTHOL 3 MG MT LOZG: 1.0000 | LOZENGE | OROMUCOSAL | Status: DC | PRN

## 2021-12-27 MED ORDER — GABAPENTIN 300 MG PO CAPS
300.0000 mg | ORAL_CAPSULE | Freq: Every day | ORAL | Status: DC
  Administered 2021-12-27 – 2021-12-30 (×4): 300 mg via ORAL
  Filled 2021-12-27 (×4): qty 1

## 2021-12-27 MED ORDER — OXYCODONE-ACETAMINOPHEN 5-325 MG PO TABS
1.0000 | ORAL_TABLET | ORAL | Status: DC | PRN
  Administered 2021-12-27 – 2021-12-28 (×7): 2 via ORAL
  Administered 2021-12-29 (×2): 1 via ORAL
  Administered 2021-12-29 – 2021-12-30 (×3): 2 via ORAL
  Administered 2021-12-30: 1 via ORAL
  Administered 2021-12-30 – 2021-12-31 (×4): 2 via ORAL
  Filled 2021-12-27 (×5): qty 2
  Filled 2021-12-27 (×2): qty 1
  Filled 2021-12-27 (×4): qty 2
  Filled 2021-12-27: qty 1
  Filled 2021-12-27 (×5): qty 2

## 2021-12-27 MED ORDER — ALUM & MAG HYDROXIDE-SIMETH 200-200-20 MG/5ML PO SUSP: 30.0000 mL | Freq: Four times a day (QID) | ORAL | Status: DC | PRN

## 2021-12-27 MED ORDER — METFORMIN HCL 500 MG PO TABS
500.0000 mg | ORAL_TABLET | Freq: Every day | ORAL | Status: DC
  Administered 2021-12-28 – 2021-12-30 (×3): 500 mg via ORAL
  Filled 2021-12-27 (×3): qty 1

## 2021-12-27 MED ORDER — DEXAMETHASONE SODIUM PHOSPHATE 10 MG/ML IJ SOLN
INTRAMUSCULAR | Status: AC
  Filled 2021-12-27: qty 1

## 2021-12-27 MED ORDER — BUPIVACAINE HCL (PF) 0.5 % IJ SOLN
INTRAMUSCULAR | Status: DC | PRN
  Administered 2021-12-27: 5 mL
  Administered 2021-12-27: 25 mL

## 2021-12-27 MED ORDER — CELECOXIB 200 MG PO CAPS
200.0000 mg | ORAL_CAPSULE | Freq: Once | ORAL | Status: AC
  Administered 2021-12-27: 200 mg via ORAL
  Filled 2021-12-27: qty 1

## 2021-12-27 MED ORDER — CEFAZOLIN SODIUM 1 G IJ SOLR
INTRAMUSCULAR | Status: AC
  Filled 2021-12-27: qty 20

## 2021-12-27 MED ORDER — ONDANSETRON HCL 4 MG/2ML IJ SOLN
INTRAMUSCULAR | Status: DC | PRN
  Administered 2021-12-27: 4 mg via INTRAVENOUS

## 2021-12-27 MED ORDER — SUGAMMADEX SODIUM 200 MG/2ML IV SOLN
INTRAVENOUS | Status: DC | PRN
  Administered 2021-12-27: 400 mg via INTRAVENOUS

## 2021-12-27 MED ORDER — METHOCARBAMOL 1000 MG/10ML IJ SOLN: 500.0000 mg | Freq: Four times a day (QID) | INTRAVENOUS | Status: DC | PRN

## 2021-12-27 MED ORDER — CEFAZOLIN SODIUM-DEXTROSE 2-4 GM/100ML-% IV SOLN
2.0000 g | INTRAVENOUS | Status: AC
  Administered 2021-12-27 (×2): 2 g via INTRAVENOUS
  Filled 2021-12-27: qty 100

## 2021-12-27 MED ORDER — SODIUM CHLORIDE 0.9 % IV SOLN: 250.0000 mL | INTRAVENOUS | Status: DC

## 2021-12-27 MED ORDER — FENTANYL CITRATE (PF) 100 MCG/2ML IJ SOLN
25.0000 ug | INTRAMUSCULAR | Status: DC | PRN
  Administered 2021-12-27 (×2): 25 ug via INTRAVENOUS

## 2021-12-27 MED ORDER — DOCUSATE SODIUM 100 MG PO CAPS
100.0000 mg | ORAL_CAPSULE | Freq: Two times a day (BID) | ORAL | Status: DC
  Administered 2021-12-27 – 2021-12-31 (×8): 100 mg via ORAL
  Filled 2021-12-27 (×9): qty 1

## 2021-12-27 MED ORDER — CHLORHEXIDINE GLUCONATE CLOTH 2 % EX PADS: 6.0000 | MEDICATED_PAD | Freq: Once | CUTANEOUS | Status: DC

## 2021-12-27 MED ORDER — THYROID 30 MG PO TABS
30.0000 mg | ORAL_TABLET | Freq: Every day | ORAL | Status: DC
  Administered 2021-12-28 – 2021-12-31 (×4): 30 mg via ORAL
  Filled 2021-12-27 (×5): qty 1

## 2021-12-27 MED ORDER — ROCURONIUM BROMIDE 10 MG/ML (PF) SYRINGE
PREFILLED_SYRINGE | INTRAVENOUS | Status: AC
  Filled 2021-12-27: qty 20

## 2021-12-27 MED ORDER — KETOROLAC TROMETHAMINE 15 MG/ML IJ SOLN
INTRAMUSCULAR | Status: AC
  Filled 2021-12-27: qty 1

## 2021-12-27 MED ORDER — MIDAZOLAM HCL 2 MG/2ML IJ SOLN
INTRAMUSCULAR | Status: AC
  Filled 2021-12-27: qty 2

## 2021-12-27 MED ORDER — SODIUM CHLORIDE 0.9 % IV SOLN: INTRAVENOUS | Status: DC | PRN

## 2021-12-27 MED ORDER — ALBUMIN HUMAN 5 % IV SOLN: INTRAVENOUS | Status: DC | PRN

## 2021-12-27 MED ORDER — POLYETHYLENE GLYCOL 3350 17 G PO PACK: 17.0000 g | PACK | Freq: Every day | ORAL | Status: DC | PRN

## 2021-12-27 MED ORDER — TIZANIDINE HCL 4 MG PO TABS: 4.0000 mg | ORAL_TABLET | Freq: Every day | ORAL | Status: DC | PRN

## 2021-12-27 MED ORDER — HYDROCHLOROTHIAZIDE 12.5 MG PO TABS
12.5000 mg | ORAL_TABLET | Freq: Every day | ORAL | Status: DC
  Administered 2021-12-29 – 2021-12-30 (×2): 12.5 mg via ORAL
  Filled 2021-12-27 (×4): qty 1

## 2021-12-27 MED ORDER — CALCIUM CARBONATE ANTACID 500 MG PO CHEW: 3.0000 | CHEWABLE_TABLET | Freq: Every day | ORAL | Status: DC | PRN

## 2021-12-27 MED ORDER — PROPOFOL 500 MG/50ML IV EMUL
INTRAVENOUS | Status: DC | PRN
  Administered 2021-12-27: 25 ug/kg/min via INTRAVENOUS

## 2021-12-27 MED ORDER — METOPROLOL SUCCINATE ER 50 MG PO TB24
100.0000 mg | ORAL_TABLET | Freq: Every day | ORAL | Status: DC
  Administered 2021-12-27 – 2021-12-30 (×4): 100 mg via ORAL
  Filled 2021-12-27: qty 2
  Filled 2021-12-27: qty 1
  Filled 2021-12-27 (×2): qty 2

## 2021-12-27 MED ORDER — EMPAGLIFLOZIN 10 MG PO TABS
10.0000 mg | ORAL_TABLET | Freq: Every morning | ORAL | Status: DC
  Administered 2021-12-28 – 2021-12-30 (×3): 10 mg via ORAL
  Filled 2021-12-27 (×5): qty 1

## 2021-12-27 MED ORDER — FENTANYL CITRATE (PF) 250 MCG/5ML IJ SOLN
INTRAMUSCULAR | Status: DC | PRN
  Administered 2021-12-27 (×5): 50 ug via INTRAVENOUS

## 2021-12-27 MED ORDER — SENNA 8.6 MG PO TABS
1.0000 | ORAL_TABLET | Freq: Two times a day (BID) | ORAL | Status: DC
  Administered 2021-12-27 – 2021-12-31 (×7): 8.6 mg via ORAL
  Filled 2021-12-27 (×7): qty 1

## 2021-12-27 MED ORDER — CHLORHEXIDINE GLUCONATE 0.12 % MT SOLN
15.0000 mL | Freq: Once | OROMUCOSAL | Status: AC
  Administered 2021-12-27: 15 mL via OROMUCOSAL
  Filled 2021-12-27: qty 15

## 2021-12-27 MED ORDER — CEFAZOLIN SODIUM-DEXTROSE 2-4 GM/100ML-% IV SOLN
2.0000 g | Freq: Three times a day (TID) | INTRAVENOUS | Status: AC
  Administered 2021-12-27 – 2021-12-28 (×2): 2 g via INTRAVENOUS
  Filled 2021-12-27 (×2): qty 100

## 2021-12-27 MED ORDER — METHOCARBAMOL 500 MG PO TABS
500.0000 mg | ORAL_TABLET | Freq: Four times a day (QID) | ORAL | Status: DC | PRN
  Administered 2021-12-28 – 2021-12-30 (×6): 500 mg via ORAL
  Filled 2021-12-27 (×6): qty 1

## 2021-12-27 MED ORDER — PROPOFOL 10 MG/ML IV BOLUS
INTRAVENOUS | Status: AC
  Filled 2021-12-27: qty 20

## 2021-12-27 MED ORDER — ROSUVASTATIN CALCIUM 5 MG PO TABS
10.0000 mg | ORAL_TABLET | Freq: Every day | ORAL | Status: DC
  Administered 2021-12-28 – 2021-12-31 (×4): 10 mg via ORAL
  Filled 2021-12-27 (×4): qty 2

## 2021-12-27 MED ORDER — FLUTICASONE PROPIONATE 50 MCG/ACT NA SUSP
1.0000 | Freq: Every day | NASAL | Status: DC
  Administered 2021-12-28 – 2021-12-30 (×3): 1 via NASAL
  Administered 2021-12-31: 2 via NASAL
  Filled 2021-12-27: qty 16

## 2021-12-27 MED ORDER — FENTANYL CITRATE (PF) 250 MCG/5ML IJ SOLN
INTRAMUSCULAR | Status: AC
  Filled 2021-12-27: qty 5

## 2021-12-27 MED ORDER — ONDANSETRON HCL 4 MG/2ML IJ SOLN
INTRAMUSCULAR | Status: AC
  Filled 2021-12-27: qty 2

## 2021-12-27 SURGICAL SUPPLY — 75 items
ADH SKN CLS APL DERMABOND .7 (GAUZE/BANDAGES/DRESSINGS) ×1
APL SRG 60D 8 XTD TIP BNDBL (TIP)
BAG COUNTER SPONGE SURGICOUNT (BAG) ×1 IMPLANT
BAG SPNG CNTER NS LX DISP (BAG) ×2
BASKET BONE COLLECTION (BASKET) ×1 IMPLANT
BLADE BONE MILL MEDIUM (MISCELLANEOUS) ×1 IMPLANT
BLADE CLIPPER SURG (BLADE) IMPLANT
BONE CANC CHIPS 20CC PCAN1/4 (Bone Implant) ×1 IMPLANT
BUR MATCHSTICK NEURO 3.0 LAGG (BURR) ×1 IMPLANT
CAGE PLIF 8X9X23-12 LUMBAR (Cage) IMPLANT
CANISTER SUCT 3000ML PPV (MISCELLANEOUS) ×1 IMPLANT
CEMENT KYPHON C01A KIT/MIXER (Cement) IMPLANT
CHIPS CANC BONE 20CC PCAN1/4 (Bone Implant) ×1 IMPLANT
CNTNR URN SCR LID CUP LEK RST (MISCELLANEOUS) ×1 IMPLANT
CONT SPEC 4OZ STRL OR WHT (MISCELLANEOUS) ×1
COVER BACK TABLE 60X90IN (DRAPES) ×1 IMPLANT
DERMABOND ADVANCED .7 DNX12 (GAUZE/BANDAGES/DRESSINGS) ×1 IMPLANT
DEVICE DISSECT PLASMABLAD 3.0S (MISCELLANEOUS) ×1 IMPLANT
DRAPE C-ARM 42X72 X-RAY (DRAPES) ×2 IMPLANT
DRAPE HALF SHEET 40X57 (DRAPES) IMPLANT
DRAPE LAPAROTOMY 100X72X124 (DRAPES) ×1 IMPLANT
DRSG OPSITE POSTOP 4X6 (GAUZE/BANDAGES/DRESSINGS) IMPLANT
DURAPREP 26ML APPLICATOR (WOUND CARE) ×1 IMPLANT
DURASEAL APPLICATOR TIP (TIP) IMPLANT
DURASEAL SPINE SEALANT 3ML (MISCELLANEOUS) IMPLANT
ELECT REM PT RETURN 9FT ADLT (ELECTROSURGICAL) ×1
ELECTRODE REM PT RTRN 9FT ADLT (ELECTROSURGICAL) ×1 IMPLANT
GAUZE 4X4 16PLY ~~LOC~~+RFID DBL (SPONGE) IMPLANT
GAUZE SPONGE 4X4 12PLY STRL (GAUZE/BANDAGES/DRESSINGS) ×1 IMPLANT
GLOVE BIOGEL PI IND STRL 8.5 (GLOVE) ×2 IMPLANT
GLOVE ECLIPSE 6.5 STRL STRAW (GLOVE) IMPLANT
GLOVE ECLIPSE 8.5 STRL (GLOVE) ×2 IMPLANT
GOWN STRL REUS W/ TWL LRG LVL3 (GOWN DISPOSABLE) IMPLANT
GOWN STRL REUS W/ TWL XL LVL3 (GOWN DISPOSABLE) IMPLANT
GOWN STRL REUS W/TWL 2XL LVL3 (GOWN DISPOSABLE) ×2 IMPLANT
GOWN STRL REUS W/TWL LRG LVL3 (GOWN DISPOSABLE) ×2
GOWN STRL REUS W/TWL XL LVL3 (GOWN DISPOSABLE)
GRAFT BNE CANC CHIPS 1-8 20CC (Bone Implant) IMPLANT
GRAFT BONE PROTEIOS XL 10CC (Orthopedic Implant) IMPLANT
HEMOSTAT POWDER KIT SURGIFOAM (HEMOSTASIS) ×1 IMPLANT
KIT BASIN OR (CUSTOM PROCEDURE TRAY) ×1 IMPLANT
KIT GRAFTMAG DEL NEURO DISP (NEUROSURGERY SUPPLIES) IMPLANT
KIT TURNOVER KIT B (KITS) ×1 IMPLANT
MILL BONE PREP (MISCELLANEOUS) ×1 IMPLANT
NDL RELINE-OR FENS 16 (NEEDLE) IMPLANT
NDL SPNL 18GX3.5 QUINCKE PK (NEEDLE) IMPLANT
NEEDLE HYPO 22GX1.5 SAFETY (NEEDLE) ×1 IMPLANT
NEEDLE RELINE-OR FENS 16 (NEEDLE) ×6 IMPLANT
NEEDLE SPNL 18GX3.5 QUINCKE PK (NEEDLE) ×1 IMPLANT
NS IRRIG 1000ML POUR BTL (IV SOLUTION) ×1 IMPLANT
PACK LAMINECTOMY NEURO (CUSTOM PROCEDURE TRAY) ×1 IMPLANT
PAD ARMBOARD 7.5X6 YLW CONV (MISCELLANEOUS) ×3 IMPLANT
PATTIES SURGICAL .5 X1 (DISPOSABLE) ×1 IMPLANT
PLASMABLADE 3.0S (MISCELLANEOUS) ×1
PUSHER RELINE FENS 36 OR (MISCELLANEOUS) IMPLANT
ROD RELIN-O LORD 5.5X65MM (Rod) IMPLANT
SCREW LOCK RELINE 5.5 TULIP (Screw) IMPLANT
SCREW RELINE PA 6.5X45 (Screw) IMPLANT
SPIKE FLUID TRANSFER (MISCELLANEOUS) ×1 IMPLANT
SPONGE SURGIFOAM ABS GEL 100 (HEMOSTASIS) IMPLANT
SPONGE T-LAP 4X18 ~~LOC~~+RFID (SPONGE) IMPLANT
SUT PROLENE 6 0 BV (SUTURE) IMPLANT
SUT VIC AB 1 CT1 18XBRD ANBCTR (SUTURE) ×1 IMPLANT
SUT VIC AB 1 CT1 8-18 (SUTURE) ×2
SUT VIC AB 2-0 CP2 18 (SUTURE) ×1 IMPLANT
SUT VIC AB 3-0 SH 8-18 (SUTURE) ×1 IMPLANT
SUT VIC AB 4-0 RB1 18 (SUTURE) ×1 IMPLANT
SYR 3ML LL SCALE MARK (SYRINGE) ×4 IMPLANT
SYR 5ML LL (SYRINGE) IMPLANT
TIP CART INSTAFILL GL 5CC (ORTHOPEDIC DISPOSABLE SUPPLIES) IMPLANT
TIP FENS REPLACE RELINE (MISCELLANEOUS) IMPLANT
TOWEL GREEN STERILE (TOWEL DISPOSABLE) ×1 IMPLANT
TOWEL GREEN STERILE FF (TOWEL DISPOSABLE) ×1 IMPLANT
TRAY FOLEY MTR SLVR 16FR STAT (SET/KITS/TRAYS/PACK) ×1 IMPLANT
WATER STERILE IRR 1000ML POUR (IV SOLUTION) ×1 IMPLANT

## 2021-12-27 NOTE — Op Note (Signed)
Date of surgery: 07/2021 Preoperative diagnosis: Spondylolisthesis with stenosis L3-4 and L4-5 with lumbar radiculopathy kyphotic deformity of lumbar spine. Postoperative diagnosis: Same: Bilateral laminotomies and foraminotomies decompression of L3 the L4 and L5 nerve roots with more work than required for simple interbody arthrodesis.  Posterior lumbar interbody arthrodesis with peek spacers local autograft and allograft with Proteioss.  Fluoroscopic guidance.  Methacrylate augmentation of pedicle screws with fluoroscopic guidance.  Sterile lateral arthrodesis L3-L4-L5. Surgeon: Kristeen Miss First Assistant: Ashok Pall MD Anesthesia: General endotracheal Indications: [name] is a 61 year old individual whose had significant spondylitic disease with spondylolisthesis at L3-4 and L4-5.  The patient has had extensive years of conservative management but has now become refractory to all treatment she has been advised regarding surgical decompression and stabilization from L3-L5.  In addition to the spondylolisthesis the patient has noted to have a significant kyphotic deformity and has difficulty standing straight she walks with a 20 degree pitch forward.  Also the patient has a 5 mm fixed anterolisthesis at L4-5 and a mobile 7 mm anterolisthesis at L3-4.  Procedure: The patient was brought to the operating room supine on the stretcher.  After the smooth induction of general tracheal anesthesia she was carefully turned prone.  The back was prepped with alcohol DuraPrep and draped in a sterile fashion.  Fluoroscopic guidance was used to localize the L3-4 and L4-5 levels.  The skin was infiltrated with 10 cc of lidocaine with epinephrine mixed 50-50 with half percent Marcaine.  Vertical incision was created and carried down to the lumbodorsal fascia which was opened on either side of the midline.  The first identifiable interspace was corresponded with the fluoroscopic markings at L3-4 and L4-5.   Dissection was then carried out over the facet joints at L3-4 and L4-5.  Superiorly the pars of the L3-4 vertebrae was used to dissect and uncover the transverse process of L3.  The L2-3 facet was not uncovered and was carefully protected.  The lateral gutters were then dissected out from L3-L4 and L5 and the transverse processes were decorticated and packed off for later use and grafting.  Then laminotomies were created removing the inferior margin lamina of L3 out to the facet at L3-4.  Complete facetectomy was performed the similar process was then carried out at L4 and a complete facetectomy of L4-5 was performed.  Thick and redundant yellow ligament was encountered and this was carefully taken up to decompress the central canal and then the lateral recesses to expose superiorly the L3 nerve root the L3-4 interspace was dissected to mobilize the nerve root of L4 and isolate the disc space at L3-4 then at L4-5 superiorly the L4 nerve root was decompressed far out into the extraforaminal zone and the L5 nerve root was decompressed across the disc space for 5.  Once all of the nerve roots were decompressed the discs were entered and a complete discectomy was performed at L4-5 and L3-4.  At L4-5 the interspace was sized and was felt that a 10 mm tall by 23 mm spacer with 12 degrees of lordosis would fit best into this interval.  This was packed into the interval with 20 cc of bone graft.  Attention was turned to L3-4 where a similar process was carried out but here an 8 mm tall 12 degree lordotic 23 mm long spacer was fitted best and this was filled with the same combination of bone graft here 15 cc of bone graft was fitted into the interspace.  Once  the interbody fusion had been completed the intertransverse spaces were packed with 12 cc of bone in each lateral gutter from L3-L5.  Pedicle entry sites were then chosen at L3-L4 and L5 using fluoroscopic guidance and 6.5 x 45 mm pedicle screws were placed into these  pedicles because the patient's history of osteopenia we decided to augment the screws and 1 cc of methacrylate was injected into each of the fenestrated screws that had been placed.  The positioning of the cement was checked fluoroscopically during the procedure.  Once this was completed 65 mm precontoured rods were used to connect the screws in a neutral construct restoring some of the lordosis that had been lost for this patient the spondylolisthesis also was completely reduced at this point.  The screws tightened in the neutral position lateral gutters were checked for the packing of the bone and care was taken to make sure that the L3 the L4 and L5 nerve roots remained and were well decompressed.  Then 25 cc of half percent Marcaine was injected into the paraspinous fascia retractors were removed the wound was inspected for hemostasis and the lumbodorsal fascia was closed with #1 Vicryl interrupted fashion 2-0 Vicryl was used in subcutaneous tissues 3-0 Vicryl subcuticularly along with a final 4-0 Vicryl subcuticular closure Dermabond was placed on the skin.  Blood loss for the procedure was estimated at 400 cc, 140 cc of Cell Saver blood was returned to the patient.

## 2021-12-27 NOTE — Anesthesia Procedure Notes (Signed)
Procedure Name: Intubation Date/Time: 12/27/2021 11:13 AM  Performed by: Valda Favia, CRNAPre-anesthesia Checklist: Patient identified, Emergency Drugs available, Suction available and Patient being monitored Patient Re-evaluated:Patient Re-evaluated prior to induction Oxygen Delivery Method: Circle System Utilized Preoxygenation: Pre-oxygenation with 100% oxygen Induction Type: IV induction Ventilation: Mask ventilation without difficulty Laryngoscope Size: Mac and 4 Grade View: Grade I Tube type: Oral Tube size: 7.0 mm Number of attempts: 1 Airway Equipment and Method: Stylet and Oral airway Placement Confirmation: ETT inserted through vocal cords under direct vision, positive ETCO2 and breath sounds checked- equal and bilateral Secured at: 21 cm Tube secured with: Tape Dental Injury: Teeth and Oropharynx as per pre-operative assessment

## 2021-12-27 NOTE — H&P (Signed)
[name] is an 61 y.o. female.   Chief Complaint: Back and bilateral leg pain with weakness HPI: Patient is a 61 year old right-handed individual who has been followed in the practice since 2014.  At that time she had symptoms of neck pain with cervical radiculopathy and back pain.  It was noted that she had evidence of cervical stenosis at C5-6 and C6-C7 and after having failed extensive efforts at conservative management underwent a two-level anterior decompression arthrodesis in 2015 of the cervical spine.  Since that time her neck is done well but she has had progressive issues with back pain and lower extremity radicular pain.  She has had a number of epidural steroid injections in addition to several rounds of physical therapy over the years which have provided some transient relief.  In the last year she has become progressively less functional with increasing back pain and bilateral leg pain and her studies demonstrate that she has evolved a spondylolisthesis at L4-5 to be nearly grade 2 at this point with a grade 1 spondylolisthesis and stenosis at the L3-L4 level.  She has marked facet hypertrophy at each of these levels.  She has been advised regarding surgical decompression and stabilization.  Attempts at weight control program have not been successful we discussed the issue of her's substantial obesity in regards to capacity to heal from surgery.  At this point she is becoming so debilitated with her gait that we have decided cautiously to proceed with a surgical intervention to decompress and stabilize L3-4 and L4-5.  Past Medical History:  Diagnosis Date   Back pain of lumbar region with sciatica    Cancer ([place])    breast   Diabetes mellitus, type II ([place])    Family history of anesthesia complication    grandfather died under anesthesia 70's- had heart issues   H/O carpal tunnel repair 2014   Headache(784.0)    Hyperlipidemia    Hypertension    Hypothyroidism    PONV  (postoperative nausea and vomiting)    Sleep apnea    cpap since 10    Past Surgical History:  Procedure Laterality Date   BILATERAL TOTAL MASTECTOMY WITH AXILLARY LYMPH NODE DISSECTION     BREAST RECONSTRUCTION     CERVICAL DISC ARTHROPLASTY N/A 03/19/2013   Procedure: CERVICAL FIVE TO SIX, CERVICAL SIX TO SEVEN CERVICAL ANTERIOR [place];  Surgeon: Kristeen Miss, MD;  Location: [facility];  Service: Neurosurgery;  Laterality: N/A;  C56 C67 artificial disc replacement   CESAREAN SECTION     COLONOSCOPY     DILATATION & CURETTAGE/HYSTEROSCOPY WITH TRUECLEAR N/A 11/25/2013   Procedure: DILATATION & CURETTAGE/HYSTEROSCOPY WITH TRUCLEAR;  Surgeon: Margarette Asal, MD;  Location: [facility];  Service: Gynecology;  Laterality: N/A;   ECTOPIC PREGNANCY SURGERY     GALLBLADDER SURGERY     MOUTH SURGERY     child- dog bite   TONSILLECTOMY     TUBAL LIGATION      Family History  Problem Relation Age of Onset   Hyperlipidemia Mother    Heart disease Father    Hypertension Father    Diabetes Father    Cancer Maternal Aunt        Breast - In 24's   Cancer Paternal Aunt        Breast - in 62's   Cancer Maternal Grandmother        breast cancer   Diabetes Paternal Grandmother    Heart disease Paternal Grandfather  Hypertension Paternal Grandfather    Suicidality Other    Sleep apnea Neg Hx    Social History:  reports that she has never smoked. She has never used smokeless tobacco. She reports current alcohol use. She reports that she does not use drugs.  Allergies:  Allergies  Allergen Reactions   Effexor [Venlafaxine Hcl]     Weight loss and cant sleep   Flexeril [Cyclobenzaprine] Hives    Medications Prior to Admission  Medication Sig Dispense Refill   acetaminophen (TYLENOL) 500 MG tablet Take 1,000 mg by mouth every 6 (six) hours as needed for moderate pain or headache.     [START ON 12/31/2021] ALPRAZolam (XANAX) 0.5 MG tablet Take 1/2-1 tablet three times daily  as needed for anxiety or insomnia 75 tablet 2   calcium carbonate (TUMS - DOSED IN MG ELEMENTAL CALCIUM) 500 MG chewable tablet Chew 3 tablets by mouth daily as needed for indigestion or heartburn.     dextromethorphan-guaiFENesin (MUCINEX DM) 30-600 MG 12hr tablet Take 2 tablets by mouth 2 (two) times daily.     diclofenac sodium (VOLTAREN) 1 % GEL Apply 2 g topically in the morning and at bedtime. (Patient not taking: Reported on 12/24/2021)     doxazosin (CARDURA) 4 MG tablet Take 1 tablet (4 mg total) by mouth at bedtime. 90 tablet 1   fluticasone (FLONASE) 50 MCG/ACT nasal spray Place 1-2 sprays into both nostrils daily.     gabapentin (NEURONTIN) 100 MG capsule Take 1 capsule (100 mg total) by mouth 2 (two) times daily as needed (anxiety). 60 capsule 2   gabapentin (NEURONTIN) 300 MG capsule Take 1 capsule (300 mg total) by mouth at bedtime. 90 capsule 0   Galcanezumab-gnlm 120 MG/ML SOAJ Inject 120 mg into the skin every 30 (thirty) days.     hydrochlorothiazide (MICROZIDE) 12.5 MG capsule Take 12.5 mg by mouth daily.     JARDIANCE 10 MG TABS tablet Take 10 mg by mouth every morning.     Lemborexant (DAYVIGO) 10 MG TABS Take 10 mg by mouth at bedtime. 90 tablet 1   loratadine (CLARITIN) 10 MG tablet Take 10 mg by mouth daily.     metFORMIN (GLUCOPHAGE) 500 MG tablet Take 500 mg by mouth daily with supper.     OnabotulinumtoxinA (BOTOX IM) Inject into the muscle every 3 (three) months. FOR MIGRAINES     rosuvastatin (CRESTOR) 10 MG tablet Take 10 mg by mouth daily.     Simethicone (GAS-X PO) Take 1 capsule by mouth daily as needed (gas). (Patient not taking: Reported on 12/24/2021)     Tetrahydrozoline HCl (VISINE OP) Place 1 drop into both eyes daily as needed (irritation).     thyroid (ARMOUR) 30 MG tablet Take 30 mg by mouth daily before breakfast.      TOPROL XL 100 MG 24 hr tablet Take 100 mg by mouth at bedtime.     UBRELVY 50 MG TABS Take 50 mg by mouth daily as needed (migraine).      Vilazodone HCl (VIIBRYD) 10 MG TABS Take 1.5 tablets (15 mg total) by mouth daily. 135 tablet 0   ibuprofen (ADVIL) 200 MG tablet Take 1 tablet (200 mg total) by mouth every 6 (six) hours as needed. (Patient not taking: Reported on 12/20/2021) 30 tablet 0   Lidocaine 0.5 % AERO Place 1 spray into both nostrils See admin instructions. CAN ONLY TAKE ONCE WEEKLY FOR MIGRAINES     neomycin-polymyxin-hydrocortisone (CORTISPORIN) OTIC solution Place 4 drops into  the right ear in the morning, at noon, and at bedtime.     rizatriptan (MAXALT-MLT) 10 MG disintegrating tablet Take 10 mg by mouth as needed for migraine.      tiZANidine (ZANAFLEX) 4 MG tablet Take 4 mg by mouth daily as needed (MIGRAINES).      Results for orders placed or performed during the hospital encounter of 12/27/21 (from the past 48 hour(s))  Glucose, capillary     Status: Abnormal   Collection Time: 12/27/21  9:43 AM  Result Value Ref Range   Glucose-Capillary 156 (H) 70 - 99 mg/dL    Comment: Glucose reference range applies only to samples taken after fasting for at least 8 hours.  ABO/Rh     Status: None (Preliminary result)   Collection Time: 12/27/21 10:25 AM  Result Value Ref Range   ABO/RH(D) PENDING    No results found.  Review of Systems  Constitutional:  Positive for activity change.  Musculoskeletal:  Positive for back pain, gait problem and myalgias.  Neurological:  Positive for weakness and numbness.  Hematological: Negative.   Psychiatric/Behavioral: Negative.    All other systems reviewed and are negative.   Blood pressure (!) 141/78, pulse 86, temperature 98.3 F (36.8 C), temperature source Oral, resp. rate 18, height '5\' 2"'$  (1.575 m), weight 102.1 kg, last menstrual period 11/12/2013, SpO2 97 %. Physical Exam Constitutional:      Appearance: Normal appearance. She is obese.  HENT:     Head: Normocephalic and atraumatic.     Right Ear: Tympanic membrane, ear canal and external ear normal.     Left  Ear: Tympanic membrane, ear canal and external ear normal.     Nose: Nose normal.     Mouth/Throat:     Mouth: Mucous membranes are moist.     Pharynx: Oropharynx is clear.  Eyes:     Extraocular Movements: Extraocular movements intact.     Conjunctiva/sclera: Conjunctivae normal.     Pupils: Pupils are equal, round, and reactive to light.  Cardiovascular:     Rate and Rhythm: Normal rate and regular rhythm.     Pulses: Normal pulses.     Heart sounds: Normal heart sounds.  Pulmonary:     Effort: Pulmonary effort is normal.     Breath sounds: Normal breath sounds.  Abdominal:     General: Abdomen is flat. Bowel sounds are normal.     Palpations: Abdomen is soft.  Musculoskeletal:     Cervical back: Normal range of motion and neck supple.     Comments: Positive straight leg raising at 30 degrees in either lower extremity.  Patrick's maneuver is negative.  Skin:    General: Skin is warm and dry.     Capillary Refill: Capillary refill takes less than 2 seconds.  Neurological:     Mental Status: She is alert.     Comments: Cranial nerve examination is within the limits of normal.  Upper extremity strength is intact in deltoids biceps triceps grips and intrinsics tone and bulk is normal.  No fasciculations are noted.  Deep tendon reflexes are depressed in the biceps and triceps.  Trace and brachioradialis bilaterally.  Lower extremity strength and reflexes reveal some mild weakness in the tibialis anterior bilaterally at 4 out of 5.  Deep tendon reflexes are absent in the Achilles bilaterally.  1+ in the patella on the left absent the patella on the right.  Tone and bulk are intact no fasciculations are noted.  Station and  gait are intact albeit patient has difficulty with heel walking.  Psychiatric:        Mood and Affect: Mood normal.        Behavior: Behavior normal.        Thought Content: Thought content normal.        Judgment: Judgment normal.       Assessment/Plan Spondylolisthesis with stenosis L3-4 and L4-5.  Plan: Decompression fusion L3-L5.  Earleen Newport, MD 12/27/2021, 10:48 AM

## 2021-12-27 NOTE — Transfer of Care (Signed)
Immediate Anesthesia Transfer of Care Note  Patient: [name]  Procedure(s) Performed: Lumbar three-four Lumbar four-five Posterior Lumbar Interbody Fusion (Back)  Patient Location: PACU  Anesthesia Type:General  Level of Consciousness: drowsy  Airway & Oxygen Therapy: Patient Spontanous Breathing and Patient connected to face mask oxygen  Post-op Assessment: Report given to RN and Post -op Vital signs reviewed and stable  Post vital signs: Reviewed and stable  Last Vitals:  Vitals Value Taken Time  BP 139/81 12/27/21 1638  Temp    Pulse 82 12/27/21 1644  Resp 14 12/27/21 1644  SpO2 94 % 12/27/21 1644  Vitals shown include unvalidated device data.  Last Pain:  Vitals:   12/27/21 1017  TempSrc:   PainSc: 6          Complications: No notable events documented.

## 2021-12-27 NOTE — Anesthesia Preprocedure Evaluation (Addendum)
Anesthesia Evaluation  Patient identified by MRN, date of birth, ID band Patient awake    Reviewed: Allergy & Precautions, NPO status , Patient's Chart, lab work & pertinent test results  History of Anesthesia Complications (+) PONV and history of anesthetic complications  Airway Mallampati: II  TM Distance: >3 FB Neck ROM: Full    Dental no notable dental hx.    Pulmonary sleep apnea and Continuous Positive Airway Pressure Ventilation    Pulmonary exam normal        Cardiovascular hypertension,  Rhythm:Regular Rate:Normal     Neuro/Psych  Headaches  Anxiety Depression       GI/Hepatic negative GI ROS, Neg liver ROS,,,  Endo/Other  diabetes, Type 2, Oral Hypoglycemic AgentsHypothyroidism    Renal/GU negative Renal ROS  negative genitourinary   Musculoskeletal  (+) Arthritis , Osteoarthritis,    Abdominal Normal abdominal exam  (+)   Peds  Hematology negative hematology ROS (+)   Anesthesia Other Findings   Reproductive/Obstetrics                             Anesthesia Physical Anesthesia Plan  ASA: 2  Anesthesia Plan: General   Post-op Pain Management: Celebrex PO (pre-op)* and Tylenol PO (pre-op)*   Induction: Intravenous  PONV Risk Score and Plan: 4 or greater and Ondansetron, Dexamethasone, Midazolam, Scopolamine patch - Pre-op, Amisulpride and Treatment may vary due to age or medical condition  Airway Management Planned: Mask and Oral ETT  Additional Equipment: None  Intra-op Plan:   Post-operative Plan: Extubation in OR  Informed Consent: I have reviewed the patients History and Physical, chart, labs and discussed the procedure including the risks, benefits and alternatives for the proposed anesthesia with the patient or authorized representative who has indicated his/her understanding and acceptance.     Dental advisory given  Plan Discussed with: CRNA  Anesthesia  Plan Comments: (Lab Results      Component                Value               Date                      WBC                      7.4                 12/21/2021                HGB                      14.9                12/21/2021                HCT                      45.6                12/21/2021                MCV                      89.2                12/21/2021  PLT                      270                 12/21/2021            Lab Results      Component                Value               Date                      NA                       141                 12/21/2021                K                        3.3 (L)             12/21/2021                CO2                      24                  12/21/2021                GLUCOSE                  168 (H)             12/21/2021                BUN                      11                  12/21/2021                CREATININE               0.69                12/21/2021                CALCIUM                  9.9                 12/21/2021                GFRNONAA                 >60                 12/21/2021           )       Anesthesia Quick Evaluation

## 2021-12-27 NOTE — Progress Notes (Signed)
Orthopedic Tech Progress Note Patient Details:  [name] 02-Aug-1960 [id number]  Ortho Devices Type of Ortho Device: Lumbar corsett Ortho Device/Splint Location: Handed to RN at [employer] nurse's station Ortho Device/Splint Interventions: Ordered      Carin Primrose 12/27/2021, 7:19 PM

## 2021-12-28 LAB — GLUCOSE, CAPILLARY
Glucose-Capillary: 125 mg/dL — ABNORMAL HIGH (ref 70–99)
Glucose-Capillary: 89 mg/dL (ref 70–99)
Glucose-Capillary: 119 mg/dL — ABNORMAL HIGH (ref 70–99)
Glucose-Capillary: 159 mg/dL — ABNORMAL HIGH (ref 70–99)

## 2021-12-28 LAB — BASIC METABOLIC PANEL
Anion gap: 8 (ref 5–15)
BUN: 7 mg/dL (ref 6–20)
CO2: 25 mmol/L (ref 22–32)
Calcium: 8.4 mg/dL — ABNORMAL LOW (ref 8.9–10.3)
Chloride: 104 mmol/L (ref 98–111)
Creatinine, Ser: 0.66 mg/dL (ref 0.44–1.00)
GFR, Estimated: >60 mL/min (ref >60)
Glucose, Bld: 127 mg/dL — ABNORMAL HIGH (ref 70–99)
Potassium: 3.7 mmol/L (ref 3.5–5.1)
Sodium: 137 mmol/L (ref 135–145)

## 2021-12-28 LAB — CBC
HCT: 36.7 % (ref 36.0–46.0)
Hemoglobin: 12.2 g/dL (ref 12.0–15.0)
MCH: 29.5 pg (ref 26.0–34.0)
MCHC: 33.2 g/dL (ref 30.0–36.0)
MCV: 88.6 fL (ref 80.0–100.0)
Platelets: 181 10*3/uL (ref 150–400)
RBC: 4.14 MIL/uL (ref 3.87–5.11)
RDW: 13 % (ref 11.5–15.5)
WBC: 7.7 10*3/uL (ref 4.0–10.5)
nRBC: 0 % (ref 0.0–0.2)

## 2021-12-28 LAB — HEMOGLOBIN A1C
Hgb A1c MFr Bld: 6.4 % — ABNORMAL HIGH (ref 4.8–5.6)
Mean Plasma Glucose: 137 mg/dL

## 2021-12-28 MED ORDER — MAGNESIUM HYDROXIDE 400 MG/5ML PO SUSP
30.0000 mL | Freq: Every day | ORAL | Status: DC | PRN
  Administered 2021-12-29: 30 mL via ORAL
  Filled 2021-12-28: qty 30

## 2021-12-28 MED ORDER — SILVER SULFADIAZINE 1 % EX CREA
TOPICAL_CREAM | Freq: Four times a day (QID) | CUTANEOUS | Status: DC | PRN
  Filled 2021-12-28: qty 85

## 2021-12-28 MED ORDER — VILAZODONE HCL 20 MG PO TABS
10.0000 mg | ORAL_TABLET | Freq: Every day | ORAL | Status: DC
  Administered 2021-12-29 – 2021-12-31 (×3): 10 mg via ORAL
  Filled 2021-12-28 (×7): qty 0.5

## 2021-12-28 MED ORDER — MAGIC MOUTHWASH
10.0000 mL | Freq: Three times a day (TID) | ORAL | Status: DC | PRN
  Administered 2021-12-29: 10 mL via ORAL
  Filled 2021-12-28 (×2): qty 10

## 2021-12-28 MED FILL — Thrombin For Soln 5000 Unit: CUTANEOUS | Qty: 5000 | Status: AC

## 2021-12-28 NOTE — Progress Notes (Signed)
Patient ID: [name], female   DOB: 08-28-1960, 61 y.o.   MRN: [id number] Signs are stable Blood sugars well-controlled with some insulin injections Patient notes that back feels different she has back pain but preoperative leg pain is gone sensation in her legs feels better.  Exam motor function appears improved.  Incision is clean and dry.  She does have an area on her chin which is weepy from some skin breakdown.  I will write for Silvadene cream for this area.  Will keep it dressed with a dry gauze dressing.  Plan is to mobilize the patient today with plan for discharge tomorrow.
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
## 2021-12-28 NOTE — Evaluation (Signed)
Occupational Therapy Evaluation Patient Details Name: [name] MRN: [id number] DOB: 09-25-1960 Today's Date: 12/28/2021   History of Present Illness Patient is a 62 yo female presenting to the hospital with spondylolisthesis with stenosis L3-4 and L4-5 with lumbar radiculopathy kyphotic deformity of lumbar spine. Patient undergoing fusion at L3-L4 and L4-L5 on 12/27/21. PMH includes C spine arthroplasty, cancer, DM, HTN and HLD   Clinical Impression   Prior to this admission, patient independent with mobility, but needing assist for lower body dressing (socks and shoes) due to back pain. Currently, patient presenting with anxiety, back pain, and need for min A for transfers and ADLs. Extensive education provided to patient and daughter with regard to ADLs, adaptive equipment, and DME to promote safety at home. Patient with fair carry over throughout. OT will continue to follow acutely, but anticipate no need for OT at discharge.      Recommendations for follow up therapy are one component of a multi-disciplinary discharge planning process, led by the attending physician.  Recommendations may be updated based on patient status, additional functional criteria and insurance authorization.   Follow Up Recommendations  No OT follow up     Assistance Recommended at Discharge Intermittent Supervision/Assistance  Patient can return home with the following A little help with walking and/or transfers;A lot of help with bathing/dressing/bathroom;Assistance with cooking/housework;Assist for transportation;Help with stairs or ramp for entrance    Functional Status Assessment  Patient has had a recent decline in their functional status and demonstrates the ability to make significant improvements in function in a reasonable and predictable amount of time.  Equipment Recommendations  Other (comment) (Rolling walker)    Recommendations for Other Services       Precautions / Restrictions  Precautions Precautions: Fall;Back Precaution Booklet Issued: Yes (comment) Precaution Comments: verbally reviewed in addition to handout Required Braces or Orthoses: Spinal Brace Spinal Brace: Lumbar corset;Applied in sitting position Restrictions Weight Bearing Restrictions: No      Mobility Bed Mobility Overal bed mobility: Needs Assistance Bed Mobility: Rolling, Sidelying to Sit, Sit to Sidelying Rolling: Supervision Sidelying to sit: Min guard     Sit to sidelying: Min assist General bed mobility comments: able to complete with extra and use of bed rail, family purchasing bed rail to use at home and patient has adjustable bed, patient with need for min A to bring BLEs back into bed at end of session    Transfers Overall transfer level: Needs assistance Equipment used: Rolling walker (2 wheels) Transfers: Sit to/from Stand Sit to Stand: Min assist           General transfer comment: min A and increased time to come into standing      Balance Overall balance assessment: Needs assistance Sitting-balance support: Bilateral upper extremity supported, Feet supported Sitting balance-Leahy Scale: Fair     Standing balance support: During functional activity, Reliant on assistive device for balance, Bilateral upper extremity supported Standing balance-Leahy Scale: Poor Standing balance comment: reliant on RW                           ADL either performed or assessed with clinical judgement   ADL Overall ADL's : Needs assistance/impaired Eating/Feeding: Set up;Sitting   Grooming: Set up;Sitting   Upper Body Bathing: Min guard;Sitting   Lower Body Bathing: Moderate assistance;Sitting/lateral leans;Sit to/from stand   Upper Body Dressing : Min guard;Sitting   Lower Body Dressing: Moderate assistance;Sitting/lateral leans;Sit to/from stand;Adhering to back  precautions   Toilet Transfer: Minimal assistance;Ambulation;Regular Toilet;Grab bars;Rolling  walker (2 wheels)   Toileting- Clothing Manipulation and Hygiene: Supervision/safety;Sitting/lateral lean;Sit to/from stand;Adhering to back precautions       Functional mobility during ADLs: Minimal assistance;Cueing for sequencing;Cueing for safety;Rolling walker (2 wheels) General ADL Comments: Patient presenting with anxiety, drecreased activity tolerance, and pain     Vision Baseline Vision/History: 1 Wears glasses (readers) Ability to See in Adequate Light: 0 Adequate Patient Visual Report: No change from baseline       Perception     Praxis      Pertinent Vitals/Pain Pain Assessment Pain Assessment: Faces Faces Pain Scale: Hurts even more Pain Location: back Pain Descriptors / Indicators: Discomfort, Grimacing, Guarding Pain Intervention(s): Limited activity within patient's tolerance, Monitored during session, Premedicated before session, Repositioned     Hand Dominance     Extremity/Trunk Assessment Upper Extremity Assessment Upper Extremity Assessment: Generalized weakness   Lower Extremity Assessment Lower Extremity Assessment: Defer to PT evaluation   Cervical / Trunk Assessment Cervical / Trunk Assessment: Back Surgery   Communication Communication Communication: No difficulties   Cognition Arousal/Alertness: Awake/alert Behavior During Therapy: Restless, Anxious Overall Cognitive Status: Within Functional Limits for tasks assessed                                 General Comments: Patient extremely anxious with all movement and education     General Comments       Exercises     Shoulder Instructions      Home Living Family/patient expects to be discharged to:: Private residence Living Arrangements: Children Available Help at Discharge: Family;Available PRN/intermittently Type of Home: House Home Access: Stairs to enter [employer] of Steps: 1 Entrance Stairs-Rails: None Home Layout: Two level;Able to live on main  level with bedroom/bathroom Alternate Level Stairs-Number of Steps: 13 Alternate Level Stairs-Rails: Right Bathroom Shower/Tub: Occupational psychologist: Standard     Home Equipment: Rollator (4 wheels);BSC/3in1;Other (comment);Hand held shower head (Lift chair and adjustable bed)          Prior Functioning/Environment Prior Level of Function : Independent/Modified Independent;Driving             Mobility Comments: independent with extra time ADLs Comments: daughter has been helping with shoes due to back pain        OT Problem List: Decreased strength;Decreased activity tolerance;Impaired balance (sitting and/or standing);Decreased coordination;Decreased safety awareness;Decreased knowledge of use of DME or AE;Decreased knowledge of precautions;Obesity;Pain      OT Treatment/Interventions: Self-care/ADL training;Therapeutic exercise;Energy conservation;DME and/or AE instruction;Manual therapy;Therapeutic activities;Patient/family education;Balance training    OT Goals(Current goals can be found in the care plan section) Acute Rehab OT Goals Patient Stated Goal: to get better OT Goal Formulation: With patient/family Time For Goal Achievement: 01/11/22 Potential to Achieve Goals: Good ADL Goals Pt Will Perform Lower Body Bathing: with set-up;sitting/lateral leans;sit to/from stand Pt Will Perform Lower Body Dressing: with set-up;sitting/lateral leans;sit to/from stand Pt Will Transfer to Toilet: with set-up;bedside commode Pt Will Perform Toileting - Clothing Manipulation and hygiene: Independently;sitting/lateral leans;sit to/from stand  OT Frequency: Min 2X/week    Co-evaluation              AM-PAC OT "6 Clicks" Daily Activity     Outcome Measure Help from another person eating meals?: None Help from another person taking care of personal grooming?: None Help from another person toileting, which includes using toliet,  bedpan, or urinal?: A Little Help  from another person bathing (including washing, rinsing, drying)?: A Lot Help from another person to put on and taking off regular upper body clothing?: A Little Help from another person to put on and taking off regular lower body clothing?: A Lot 6 Click Score: 18   End of Session Equipment Utilized During Treatment: Rolling walker (2 wheels) Nurse Communication: Mobility status  Activity Tolerance: Patient limited by fatigue;Patient limited by pain Patient left: in bed;with call bell/phone within reach;with family/visitor present  OT Visit Diagnosis: Unsteadiness on feet (R26.81);Muscle weakness (generalized) (M62.81);Pain;Other abnormalities of gait and mobility (R26.89) Pain - part of body:  (Back)                Time: 2248-2500 OT Time Calculation (min): 39 min Charges:  OT General Charges $OT Visit: 1 Visit OT Evaluation $OT Eval Moderate Complexity: 1 Mod OT Treatments $Self Care/Home Management : 23-37 mins  [place]. , OTR/L Acute Rehabilitation Services [phone]   Ascencion Dike 12/28/2021, 10:49 AM

## 2021-12-28 NOTE — Anesthesia Postprocedure Evaluation (Signed)
Anesthesia Post Note  Patient: [name]  Procedure(s) Performed: Lumbar three-four Lumbar four-five Posterior Lumbar Interbody Fusion (Back)     Patient location during evaluation: PACU Anesthesia Type: General Level of consciousness: awake and alert Pain management: pain level controlled Vital Signs Assessment: post-procedure vital signs reviewed and stable Respiratory status: spontaneous breathing, nonlabored ventilation, respiratory function stable and patient connected to nasal cannula oxygen Cardiovascular status: blood pressure returned to baseline and stable Postop Assessment: no apparent nausea or vomiting Anesthetic complications: no   No notable events documented.  Last Vitals:  Vitals:   12/28/21 0734 12/28/21 1153  BP: (!) 95/42 (!) 96/50  Pulse: 75 84  Resp: 18 18  Temp: 36.6 C 36.8 C  SpO2: 98% 100%    Last Pain:  Vitals:   12/28/21 1153  TempSrc: Oral  PainSc:                  March Rummage 

## 2021-12-28 NOTE — Progress Notes (Signed)
Patient ID: [name], female   DOB: 30-Nov-1960, 61 y.o.   MRN: [id number] Patient is mobilizing but slowly.  She is still having a fair amount of back pain.  She will be home with her daughter but needs significant help at this point.  She wishes to stay another day.  Will transfer to her to a regular hospital floor.

## 2021-12-29 LAB — GLUCOSE, CAPILLARY
Glucose-Capillary: 113 mg/dL — ABNORMAL HIGH (ref 70–99)
Glucose-Capillary: 193 mg/dL — ABNORMAL HIGH (ref 70–99)
Glucose-Capillary: 132 mg/dL — ABNORMAL HIGH (ref 70–99)
Glucose-Capillary: 109 mg/dL — ABNORMAL HIGH (ref 70–99)

## 2021-12-29 NOTE — Progress Notes (Signed)
Patient ID: [name], female   DOB: [birth year], 61 y.o.   MRN: [id number] Vital signs are stable Patient still having difficulty with mobility reaching bathroom on time for voiding purposes and some difficulty with activities of daily living.  OT is involved.  PT continues.  No BM since surgery.  Milk of magnesia as ordered.  Will see how she progresses with activities and level of function today and tomorrow.  Possible discharge on Monday

## 2021-12-29 NOTE — Progress Notes (Signed)
Physical Therapy Treatment Patient Details Name: [name] MRN: [id number] DOB: 1960/04/29 Today's Date: 12/29/2021   History of Present Illness Patient is a 61 yo female presenting to the hospital with spondylolisthesis with stenosis L3-4 and L4-5 with lumbar radiculopathy kyphotic deformity of lumbar spine. Patient undergoing fusion at L3-L4 and L4-L5 on 12/27/21. PMH includes C spine arthroplasty, cancer, DM, HTN and HLD    PT Comments    Pt was seen for progressing mobility on side of bed to get to bathroom, then to walk on the hallway.  Pt has practiced a step, demonstrates understanding about using her painful leg as the more dependent limb, and to sequence appropriately with all gait challenges.  Pt is able to verbalize the order to apply brace, and demonstrates awareness of precautions for back with getting on and off bed.  Follow acutely for goals of PT and reinforce body mechanics and safety.    Recommendations for follow up therapy are one component of a multi-disciplinary discharge planning process, led by the attending physician.  Recommendations may be updated based on patient status, additional functional criteria and insurance authorization.  Follow Up Recommendations  No PT follow up     Assistance Recommended at Discharge Intermittent Supervision/Assistance  Patient can return home with the following Assist for transportation;Assistance with cooking/housework;A little help with bathing/dressing/bathroom;A little help with walking and/or transfers   Equipment Recommendations  Rolling walker (2 wheels)    Recommendations for Other Services       Precautions / Restrictions Precautions Precautions: Fall;Back Precaution Booklet Issued: Yes (comment) Required Braces or Orthoses: Spinal Brace Spinal Brace: Lumbar corset;Applied in sitting position Restrictions Weight Bearing Restrictions: No     Mobility  Bed Mobility Overal bed mobility: Needs Assistance Bed  Mobility: Rolling, Sidelying to Sit, Sit to Sidelying Rolling: Min assist Sidelying to sit: Mod assist     Sit to sidelying: Mod assist General bed mobility comments: discussion about leveling bed to avoid the pressure on back and using more precise body mechanics    Transfers Overall transfer level: Needs assistance Equipment used: Rolling walker (2 wheels) Transfers: Sit to/from Stand Sit to Stand: Min guard           General transfer comment: pt is taking extra time to set up the support of legs    Ambulation/Gait Ambulation/Gait assistance: Min guard Gait Distance (Feet): 120 Feet (30+90) Assistive device: Rolling walker (2 wheels) Gait Pattern/deviations: Decreased stride length, Decreased weight shift to left, Wide base of support Gait velocity: reduced Gait velocity interpretation: <1.31 ft/sec, indicative of household ambulator Pre-gait activities: standing balance ck General Gait Details: taking her time and reducing stress on LLE due to pains down leg   Stairs Stairs: Yes Stairs assistance: Min assist, Min guard Stair Management: No rails, Forwards, Backwards, Step to pattern Number of Stairs: 1 General stair comments: practiced one step up to flat surface for home   Wheelchair Mobility    Modified Rankin (Stroke Patients Only)       Balance Overall balance assessment: Needs assistance Sitting-balance support: Feet supported Sitting balance-Leahy Scale: Fair     Standing balance support: Bilateral upper extremity supported, During functional activity Standing balance-Leahy Scale: Poor Standing balance comment: RW support                            Cognition Arousal/Alertness: Awake/alert Behavior During Therapy: Anxious Overall Cognitive Status: Within Functional Limits for tasks assessed  General Comments: pt is aware pf the rules of spinal care, very concerned about the exact follow  through on them        Exercises      General Comments General comments (skin integrity, edema, etc.): Pt is up to walk with help and notably a switch of legs that are hurting since yesterday, which PT reported to surgeon      Pertinent Vitals/Pain Pain Assessment Pain Assessment: Faces Faces Pain Scale: Hurts even more Pain Location: LLE and back Pain Descriptors / Indicators: Grimacing, Guarding, Shooting Pain Intervention(s): Monitored during session, Repositioned, Premedicated before session, Limited activity within patient's tolerance    Home Living                          Prior Function            PT Goals (current goals can now be found in the care plan section) Acute Rehab PT Goals Patient Stated Goal: to walk more and get home Progress towards PT goals: Progressing toward goals    Frequency    Min 5X/week      PT Plan Current plan remains appropriate    Co-evaluation              AM-PAC PT "6 Clicks" Mobility   Outcome Measure  Help needed turning from your back to your side while in a flat bed without using bedrails?: A Little Help needed moving from lying on your back to sitting on the side of a flat bed without using bedrails?: A Lot Help needed moving to and from a bed to a chair (including a wheelchair)?: A Little Help needed standing up from a chair using your arms (e.g., wheelchair or bedside chair)?: A Little Help needed to walk in hospital room?: A Little Help needed climbing 3-5 steps with a railing? : Total 6 Click Score: 15    End of Session Equipment Utilized During Treatment: Gait belt;Back brace Activity Tolerance: Patient limited by pain Patient left: in bed;with call bell/phone within reach;with family/visitor present Nurse Communication: Mobility status PT Visit Diagnosis: Other abnormalities of gait and mobility (R26.89);Muscle weakness (generalized) (M62.81);Pain Pain - Right/Left: Left Pain - part of body:  Leg     Time: 1256-1330 PT Time Calculation (min) (ACUTE ONLY): 34 min  Charges:  $Gait Training: 8-22 mins $Therapeutic Activity: 8-22 mins    Ramond Dial 12/29/2021, 3:08 PM  Mee Hives, PT PhD Acute Rehab Dept. Number: [place] and [place]

## 2021-12-30 ENCOUNTER — Inpatient Hospital Stay (HOSPITAL_COMMUNITY): Payer: No Typology Code available for payment source

## 2021-12-30 LAB — GLUCOSE, CAPILLARY
Glucose-Capillary: 150 mg/dL — ABNORMAL HIGH (ref 70–99)
Glucose-Capillary: 96 mg/dL (ref 70–99)
Glucose-Capillary: 125 mg/dL — ABNORMAL HIGH (ref 70–99)
Glucose-Capillary: 116 mg/dL — ABNORMAL HIGH (ref 70–99)

## 2021-12-30 NOTE — Progress Notes (Signed)
Neurosurgery Service Progress Note  Subjective: No acute events overnight. RLE radicular pain improved from preop, does have some LLE pain but only when she stands after walking for some time, otherwise back pain   Objective: Vitals:   12/29/21 1700 12/29/21 1959 12/30/21 0416 12/30/21 0700  BP: (!) 130/44 (!) 112/46 127/61 (!) 118/57  Pulse: 87 91 85 87  Resp: '18 18 18 18  '$ Temp: 99.1 F (37.3 C) 98.6 F (37 C) 98.4 F (36.9 C) 98 F (36.7 C)  TempSrc: Oral Oral Oral Oral  SpO2: 98% 97% 100% 95%  Weight:      Height:        Physical Exam: Strength 5/5 x4 and SILTx4, chin dressing in place clean and dry  Assessment & Plan: 61 y.o. woman s/p L3-4/45 PLIF, recovering well.  -continue PT/OT / mobilization efforts  Judith Part  12/30/21 12:02 PM

## 2021-12-31 ENCOUNTER — Ambulatory Visit: Payer: No Typology Code available for payment source | Admitting: Psychiatry

## 2021-12-31 LAB — GLUCOSE, CAPILLARY: Glucose-Capillary: 129 mg/dL — ABNORMAL HIGH (ref 70–99)

## 2021-12-31 MED ORDER — METHOCARBAMOL 500 MG PO TABS: 500.0000 mg | ORAL_TABLET | Freq: Four times a day (QID) | ORAL | 3 refills | Status: DC | PRN

## 2021-12-31 MED ORDER — OXYCODONE-ACETAMINOPHEN 5-325 MG PO TABS: 1.0000 | ORAL_TABLET | ORAL | 0 refills | Status: DC | PRN

## 2021-12-31 NOTE — Progress Notes (Signed)
Physical Therapy Treatment Patient Details Name: [name] MRN: [id number] DOB: 09-Mar-1960 Today's Date: 12/31/2021   History of Present Illness Patient is a 61 yo female presenting to the hospital with spondylolisthesis with stenosis L3-4 and L4-5 with lumbar radiculopathy kyphotic deformity of lumbar spine. Patient undergoing fusion at L3-L4 and L4-L5 on 12/27/21. PMH includes C spine arthroplasty, cancer, DM, HTN and HLD    PT Comments    Patient progressing with less assist today for bed mobility.  Still reliant on rail and needing increased time.  Sit to stand fron all surfaces today with S.  Patient stable fpr d/c home and has assist from family.  Reinforced to discuss with surgeon when ready to be released to get referral for outpatient PT.  Noted for home today.  Recommendations for follow up therapy are one component of a multi-disciplinary discharge planning process, led by the attending physician.  Recommendations may be updated based on patient status, additional functional criteria and insurance authorization.  Follow Up Recommendations  No PT follow up     Assistance Recommended at Discharge    Patient can return home with the following Assist for transportation;Assistance with cooking/housework;A little help with bathing/dressing/bathroom;A little help with walking and/or transfers   Equipment Recommendations  Rolling walker (2 wheels)    Recommendations for Other Services       Precautions / Restrictions Precautions Precautions: Fall;Back Required Braces or Orthoses: Spinal Brace Spinal Brace: Lumbar corset;Applied in sitting position     Mobility  Bed Mobility Overal bed mobility: Needs Assistance Bed Mobility: Rolling, Sidelying to Sit Rolling: Supervision Sidelying to sit: Min assist       General bed mobility comments: assist for lifting trunk and cues for  momentum when lowering leg to lift trunk    Transfers Overall transfer level: Needs  assistance Equipment used: Rolling walker (2 wheels) Transfers: Sit to/from Stand Sit to Stand: Supervision           General transfer comment: increased time, slow and moaning with pain at times when rising or sitting    Ambulation/Gait Ambulation/Gait assistance: Supervision, Min guard Gait Distance (Feet): 15 Feet (&20') Assistive device: Rolling walker (2 wheels) Gait Pattern/deviations: Step-to pattern, Decreased stride length, Step-through pattern       General Gait Details: shorter strides getting used to wearing shoes; to bathroom then out and then trying out new walker as delivered during session   Stairs             Wheelchair Mobility    Modified Rankin (Stroke Patients Only)       Balance Overall balance assessment: Needs assistance   Sitting balance-Leahy Scale: Good       Standing balance-Leahy Scale: Fair Standing balance comment: hand off walker to sign for new walker in standing and while adjusting clothing after toileting                            Cognition Arousal/Alertness: Awake/alert Behavior During Therapy: WFL for tasks assessed/performed Overall Cognitive Status: Within Functional Limits for tasks assessed                                          Exercises      General Comments General comments (skin integrity, edema, etc.): able to toilet and perform perineal hygiene min A to help with clothing  management      Pertinent Vitals/Pain Pain Assessment Faces Pain Scale: Hurts even more Pain Location: hips and back Pain Descriptors / Indicators: Moaning, Discomfort, Grimacing Pain Intervention(s): Monitored during session, Repositioned    Home Living                          Prior Function            PT Goals (current goals can now be found in the care plan section) Progress towards PT goals: Progressing toward goals    Frequency    Min 5X/week      PT Plan Current plan  remains appropriate    Co-evaluation              AM-PAC PT "6 Clicks" Mobility   Outcome Measure  Help needed turning from your back to your side while in a flat bed without using bedrails?: A Little Help needed moving from lying on your back to sitting on the side of a flat bed without using bedrails?: A Little Help needed moving to and from a bed to a chair (including a wheelchair)?: A Little Help needed standing up from a chair using your arms (e.g., wheelchair or bedside chair)?: None Help needed to walk in hospital room?: A Little Help needed climbing 3-5 steps with a railing? : A Little 6 Click Score: 19    End of Session Equipment Utilized During Treatment: Gait belt;Back brace Activity Tolerance: Patient tolerated treatment well Patient left: in chair;with call bell/phone within reach   PT Visit Diagnosis: Other abnormalities of gait and mobility (R26.89);Muscle weakness (generalized) (M62.81)     Time: 0315-9458 PT Time Calculation (min) (ACUTE ONLY): 29 min  Charges:  $Gait Training: 8-22 mins $Therapeutic Activity: 8-22 mins                     Magda Kiel, PT Acute Rehabilitation Services [phone] 12/31/2021    Reginia Naas 12/31/2021, 1:05 PM

## 2021-12-31 NOTE — Discharge Summary (Signed)
Physician Discharge Summary  Patient ID: [name] MRN: [id number] DOB/AGE: 1960/12/25 61 y.o.  Admit date: 12/27/2021 Discharge date: 12/31/2021  Admission Diagnoses: Lumbar spondylolisthesis L3-4 and L4-5 with lumbar stenosis and radiculopathy  Discharge Diagnoses: Lumbar spondylolisthesis L3-4 L4-5.  Lumbar stenosis.  Lumbar radiculopathy.  Neurogenic claudication. Principal Problem:   Spondylolisthesis at L4-L5 level   Discharged Condition: good  Hospital Course: Patient was admitted to undergo surgical decompression arthrodesis L3-L5.  She tolerated surgery well.  Diabetic control was achieved with some use of insulin injection.  The patient however now is on her home medications.  Consults: None  Significant Diagnostic Studies: Postoperative laboratories reveal mild anemia but nothing that required transfusion.  Incision is clean and dry.  Treatments: surgery: See op note  Discharge Exam: Blood pressure (!) 128/58, pulse 80, temperature 97.7 F (36.5 C), temperature source Oral, resp. rate 16, height '5\' 2"'$  (1.575 m), weight 102.1 kg, last menstrual period 11/12/2013, SpO2 97 %. Incision is clean and dry Station and gait are intact.  Disposition: Discharge disposition: 01-Home or Self Care       Discharge Instructions     Call MD for:  redness, tenderness, or signs of infection (pain, swelling, redness, odor or green/yellow discharge around incision site)   Complete by: As directed    Call MD for:  severe uncontrolled pain   Complete by: As directed    Call MD for:  temperature >100.4   Complete by: As directed    Diet - low sodium heart healthy   Complete by: As directed    Discharge wound care:   Complete by: As directed    Okay to shower. Do not apply salves or appointments to incision. No heavy lifting with the upper extremities greater than 10 pounds. May resume driving when not requiring pain medication and patient feels comfortable with doing so.    Incentive spirometry RT   Complete by: As directed    Increase activity slowly   Complete by: As directed       Allergies as of 12/31/2021       Reactions   Effexor [venlafaxine Hcl]    Weight loss and cant sleep   Flexeril [cyclobenzaprine] Hives        Medication List     TAKE these medications    acetaminophen 500 MG tablet Commonly known as: TYLENOL Take 1,000 mg by mouth every 6 (six) hours as needed for moderate pain or headache.   ALPRAZolam 0.5 MG tablet Commonly known as: XANAX Take 1/2-1 tablet three times daily as needed for anxiety or insomnia   BOTOX IM Inject into the muscle every 3 (three) months. FOR MIGRAINES   calcium carbonate 500 MG chewable tablet Commonly known as: TUMS - dosed in mg elemental calcium Chew 3 tablets by mouth daily as needed for indigestion or heartburn.   DayVigo 10 MG Tabs Generic drug: Lemborexant Take 10 mg by mouth at bedtime.   dextromethorphan-guaiFENesin 30-600 MG 12hr tablet Commonly known as: MUCINEX DM Take 2 tablets by mouth 2 (two) times daily.   diclofenac sodium 1 % Gel Commonly known as: VOLTAREN Apply 2 g topically in the morning and at bedtime.   doxazosin 4 MG tablet Commonly known as: CARDURA Take 1 tablet (4 mg total) by mouth at bedtime.   fluticasone 50 MCG/ACT nasal spray Commonly known as: FLONASE Place 1-2 sprays into both nostrils daily.   gabapentin 300 MG capsule Commonly known as: NEURONTIN Take 1 capsule (300 mg total)  by mouth at bedtime.   gabapentin 100 MG capsule Commonly known as: NEURONTIN Take 1 capsule (100 mg total) by mouth 2 (two) times daily as needed (anxiety).   Galcanezumab-gnlm 120 MG/ML Soaj Inject 120 mg into the skin every 30 (thirty) days.   GAS-X PO Take 1 capsule by mouth daily as needed (gas).   hydrochlorothiazide 12.5 MG capsule Commonly known as: MICROZIDE Take 12.5 mg by mouth daily.   ibuprofen 200 MG tablet Commonly known as: Advil Take 1  tablet (200 mg total) by mouth every 6 (six) hours as needed.   Jardiance 10 MG Tabs tablet Generic drug: empagliflozin Take 10 mg by mouth every morning.   Lidocaine 0.5 % Aero Place 1 spray into both nostrils See admin instructions. CAN ONLY TAKE ONCE WEEKLY FOR MIGRAINES   loratadine 10 MG tablet Commonly known as: CLARITIN Take 10 mg by mouth daily.   metFORMIN 500 MG tablet Commonly known as: GLUCOPHAGE Take 500 mg by mouth daily with supper.   methocarbamol 500 MG tablet Commonly known as: ROBAXIN Take 1 tablet (500 mg total) by mouth every 6 (six) hours as needed for muscle spasms.   neomycin-polymyxin-hydrocortisone OTIC solution Commonly known as: CORTISPORIN Place 4 drops into the right ear in the morning, at noon, and at bedtime.   oxyCODONE-acetaminophen 5-325 MG tablet Commonly known as: PERCOCET/ROXICET Take 1 tablet by mouth every 4 (four) hours as needed for moderate pain or severe pain.   rizatriptan 10 MG disintegrating tablet Commonly known as: MAXALT-MLT Take 10 mg by mouth as needed for migraine.   rosuvastatin 10 MG tablet Commonly known as: CRESTOR Take 10 mg by mouth daily.   thyroid 30 MG tablet Commonly known as: ARMOUR Take 30 mg by mouth daily before breakfast.   tiZANidine 4 MG tablet Commonly known as: ZANAFLEX Take 4 mg by mouth daily as needed (MIGRAINES).   Toprol XL 100 MG 24 hr tablet Generic drug: metoprolol succinate Take 100 mg by mouth at bedtime.   Ubrelvy 50 MG Tabs Generic drug: Ubrogepant Take 50 mg by mouth daily as needed (migraine).   Vilazodone HCl 10 MG Tabs Commonly known as: Viibryd Take 1.5 tablets (15 mg total) by mouth daily.   VISINE OP Place 1 drop into both eyes daily as needed (irritation).               Durable Medical Equipment  (From admission, onward)           Start     Ordered   12/31/21 0808  For home use only DME Walker rolling  Once       Question Answer Comment  Walker:  With [place] [employer]   Patient needs a walker to treat with the following condition Weakness      12/31/21 0807              Discharge Care Instructions  (From admission, onward)           Start     Ordered   12/31/21 0000  Discharge wound care:       Comments: Okay to shower. Do not apply salves or appointments to incision. No heavy lifting with the upper extremities greater than 10 pounds. May resume driving when not requiring pain medication and patient feels comfortable with doing so.   12/31/21 0853             Signed: Blanchie Dessert  12/31/2021, 8:53 AM

## 2021-12-31 NOTE — Progress Notes (Signed)
CSW spoke with Erasmo Downer at [employer] to order a rolling walker. The walker will be delivered to bedside.  Madilyn Fireman, MSW, LCSW Transitions of Care  Clinical Social Worker II [phone]

## 2021-12-31 NOTE — Plan of Care (Signed)
  Problem: Health Behavior/Discharge Planning: Goal: Ability to manage health-related needs will improve Outcome: Progressing   

## 2022-01-04 ENCOUNTER — Telehealth: Payer: Self-pay | Admitting: Psychiatry

## 2022-01-04 MED FILL — Heparin Sodium (Porcine) Inj 1000 Unit/ML: INTRAMUSCULAR | Qty: 30 | Status: AC

## 2022-01-04 MED FILL — Sodium Chloride IV Soln 0.9%: INTRAVENOUS | Qty: 1000 | Status: AC

## 2022-01-04 NOTE — Telephone Encounter (Signed)
Her PA is pending for Dayvigo 10 mg should be approved today since it's a renewal with [employer]

## 2022-01-04 NOTE — Telephone Encounter (Signed)
Pt lvm that she needs a renewal PA on her dayvigo

## 2022-01-07 NOTE — Telephone Encounter (Signed)
PA is approved, waiting on fax with dates approved.

## 2022-01-29 NOTE — Telephone Encounter (Signed)
PA approved from 01/04/2022-01/04/2024 for Dayvigo 10 mg tablets with [employer]

## 2022-02-25 ENCOUNTER — Other Ambulatory Visit: Payer: Self-pay | Admitting: Psychiatry

## 2022-02-25 ENCOUNTER — Telehealth (INDEPENDENT_AMBULATORY_CARE_PROVIDER_SITE_OTHER): Payer: No Typology Code available for payment source | Admitting: Psychiatry

## 2022-02-25 ENCOUNTER — Encounter: Payer: Self-pay | Admitting: Psychiatry

## 2022-02-25 DIAGNOSIS — F32A Depression, unspecified: Secondary | ICD-10-CM

## 2022-02-25 DIAGNOSIS — F411 Generalized anxiety disorder: Secondary | ICD-10-CM

## 2022-02-25 DIAGNOSIS — F4310 Post-traumatic stress disorder, unspecified: Secondary | ICD-10-CM | POA: Diagnosis not present

## 2022-02-25 DIAGNOSIS — G4700 Insomnia, unspecified: Secondary | ICD-10-CM | POA: Diagnosis not present

## 2022-02-25 MED ORDER — DAYVIGO 10 MG PO TABS: 10.0000 mg | ORAL_TABLET | Freq: Every day | ORAL | 1 refills | Status: DC

## 2022-02-25 MED ORDER — VILAZODONE HCL 20 MG PO TABS: 20.0000 mg | ORAL_TABLET | Freq: Every day | ORAL | 2 refills | Status: DC

## 2022-02-25 MED ORDER — GABAPENTIN 100 MG PO CAPS: 100.0000 mg | ORAL_CAPSULE | Freq: Two times a day (BID) | ORAL | 2 refills | Status: DC | PRN

## 2022-02-25 MED ORDER — GABAPENTIN 300 MG PO CAPS: 300.0000 mg | ORAL_CAPSULE | Freq: Every day | ORAL | 0 refills | Status: DC

## 2022-02-25 NOTE — Progress Notes (Addendum)
YARET RIOPEL [id number] [birth year] 62 y.o.  Virtual Visit via Video Note  I connected with pt @ on 02/25/22 at  1:30 PM EST by a video enabled telemedicine application and verified that I am speaking with the correct person using two identifiers.   I discussed the limitations of evaluation and management by telemedicine and the availability of in person appointments. The patient expressed understanding and agreed to proceed.  I discussed the assessment and treatment plan with the patient. The patient was provided an opportunity to ask questions and all were answered. The patient agreed with the plan and demonstrated an understanding of the instructions.   The patient was advised to call back or seek an in-person evaluation if the symptoms worsen or if the condition fails to improve as anticipated.  I provided 45 minutes of non-face-to-face time during this encounter.  The patient was located at home.  The provider was located at [place].   Thayer Headings, PMHNP   Subjective:   Patient ID:  [name] is a 62 y.o. (DOB January 17, 1961) female.  Chief Complaint:  Chief Complaint  Patient presents with   Anxiety   Depression    Anxiety    Depression        Past medical history includes anxiety.    Alphonsa Overall presents for follow-up of anxiety, depression, and insomnia. She had spinal surgery in December. She is walking with a walker. She had a brace and has had pain since no longer wearing brace. She reports she has been at home since her surgery with the exception of a couple of doctor's appointments. She reports that her church brought meals for a couple of weeks. She reports that she went out of the house Saturday to see a friend.   She reports around 8 pm she has "pins and needles" from her lower back and down her thigh. She reports that she has increased anxiety in the evening as well.   She reports that she has been taking Alprazolam 0.5 mg 1.5 tabs at  bedtime to help with sleep. She stopped taking Oxycodone 1.5-2 weeks ago. She reports that she has had some shortness of breath at times and she and her therapist has attributed this to anxiety. She reports that she is trying to redirect anxious thoughts. She reports that the sound of her husband's voice has triggered severe anxiety.   She and husband remain separated. She reports that she was having contact periodically with husband and she and her children would notice this interfered with her ability to do what needed to be done with her physical recovery from surgery and "I would sit and cry" instead of walking. She has panic in response to husband "being mean... yelling at" their daughter. Reports that her mother was not kind to her children while she was having surgery.   She reports that she has been praying and going devotionals. Has been watching something on TV that is calming and has some guided imagery and breathing exercises.   She has been taking Gabapentin 100 mg in the morning. She occasionally will take 1/2 of an alprazolam during the day if she has panic. Taking Xanax 1 mg at bedtime. She reports that she notices migraines are triggered by sleep deprivation. She is typically sleeping about 9.5 hours.   She reports that it is difficult to determine response to increase in Viibryd due to multiple stressors. She reports that she is experiencing some depression after immobility and pain  from surgery. She reports that her energy has been lower with decreased activity. Motivation is fair. She has felt more hopeful since she had her brace removed.  Denies SI.   She reports that on New Year's her mother told her that she wished she was not her mother.   Past Psychiatric Medication Trials: Ambien- Has been taking 5-10 mg Intermezzo- Helpful for sleep initiation Xanax Effexor XR-Multiple adverse effects. Severe discontinuation s/s. Zonisamide- SI. Lexapro-Concentration  difficulty Viibryd Elavil- Apatheric, difficulty with work Topamax- Anger, irritability Benadryl- partially effective.  Dayvigo- Effective. Has mild drowsiness the following day.  Doxepin- Excessive sedation Doxazosin Gabapentin    Review of Systems:  Review of Systems  Musculoskeletal:  Positive for back pain.  Neurological:        She reports that she has less migraines compared to the past.   Psychiatric/Behavioral:  Positive for depression.        Please refer to HPI    Medications: I have reviewed the patient's current medications.  Current Outpatient Medications  Medication Sig Dispense Refill   ALPRAZolam (XANAX) 0.5 MG tablet Take 1/2-1 tablet three times daily as needed for anxiety or insomnia 75 tablet 2   doxazosin (CARDURA) 4 MG tablet Take 1 tablet (4 mg total) by mouth at bedtime. 90 tablet 1   gabapentin (NEURONTIN) 100 MG capsule Take 1 capsule (100 mg total) by mouth 2 (two) times daily as needed (anxiety). 60 capsule 2   gabapentin (NEURONTIN) 300 MG capsule Take 1 capsule (300 mg total) by mouth at bedtime. 90 capsule 0   Lemborexant (DAYVIGO) 10 MG TABS Take 10 mg by mouth at bedtime. 90 tablet 1   metoprolol succinate (TOPROL-XL) 100 MG 24 hr tablet 1 tablet Orally Once a day for 90 days     Vilazodone HCl (VIIBRYD) 10 MG TABS Take 1.5 tablets (15 mg total) by mouth daily. 135 tablet 0   acetaminophen (TYLENOL) 500 MG tablet Take 1,000 mg by mouth every 6 (six) hours as needed for moderate pain or headache.     calcium carbonate (TUMS - DOSED IN MG ELEMENTAL CALCIUM) 500 MG chewable tablet Chew 3 tablets by mouth daily as needed for indigestion or heartburn.     dextromethorphan-guaiFENesin (MUCINEX DM) 30-600 MG 12hr tablet Take 2 tablets by mouth 2 (two) times daily.     diclofenac sodium (VOLTAREN) 1 % GEL Apply 2 g topically in the morning and at bedtime. (Patient not taking: Reported on 12/24/2021)     fluticasone (FLONASE) 50 MCG/ACT nasal spray Place 1-2  sprays into both nostrils daily.     Galcanezumab-gnlm 120 MG/ML SOAJ Inject 120 mg into the skin every 30 (thirty) days.     hydrochlorothiazide (MICROZIDE) 12.5 MG capsule Take 12.5 mg by mouth daily.     ibuprofen (ADVIL) 200 MG tablet Take 1 tablet (200 mg total) by mouth every 6 (six) hours as needed. (Patient not taking: Reported on 12/20/2021) 30 tablet 0   JARDIANCE 10 MG TABS tablet Take 10 mg by mouth every morning.     Lidocaine 0.5 % AERO Place 1 spray into both nostrils See admin instructions. CAN ONLY TAKE ONCE WEEKLY FOR MIGRAINES     loratadine (CLARITIN) 10 MG tablet Take 10 mg by mouth daily.     metFORMIN (GLUCOPHAGE) 500 MG tablet Take 500 mg by mouth daily with supper.     methocarbamol (ROBAXIN) 500 MG tablet Take 1 tablet (500 mg total) by mouth every 6 (six) hours as  needed for muscle spasms. 30 tablet 3   neomycin-polymyxin-hydrocortisone (CORTISPORIN) OTIC solution Place 4 drops into the right ear in the morning, at noon, and at bedtime.     OnabotulinumtoxinA (BOTOX IM) Inject into the muscle every 3 (three) months. FOR MIGRAINES     oxyCODONE-acetaminophen (PERCOCET/ROXICET) 5-325 MG tablet Take 1 tablet by mouth every 4 (four) hours as needed for moderate pain or severe pain. (Patient not taking: Reported on 02/25/2022) 40 tablet 0   rizatriptan (MAXALT-MLT) 10 MG disintegrating tablet Take 10 mg by mouth as needed for migraine.      rosuvastatin (CRESTOR) 10 MG tablet Take 10 mg by mouth daily.     Simethicone (GAS-X PO) Take 1 capsule by mouth daily as needed (gas). (Patient not taking: Reported on 12/24/2021)     Tetrahydrozoline HCl (VISINE OP) Place 1 drop into both eyes daily as needed (irritation).     thyroid (ARMOUR) 30 MG tablet Take 30 mg by mouth daily before breakfast.      tiZANidine (ZANAFLEX) 4 MG tablet Take 4 mg by mouth daily as needed (MIGRAINES).     TOPROL XL 100 MG 24 hr tablet Take 100 mg by mouth at bedtime.     UBRELVY 50 MG TABS Take 50 mg by  mouth daily as needed (migraine).     No current facility-administered medications for this visit.   Facility-Administered Medications Ordered in Other Visits  Medication Dose Route Frequency Provider Last Rate Last Admin   ondansetron (ZOFRAN) 4 mg in sodium chloride 0.9 % 50 mL IVPB  4 mg Intravenous Q6H PRN Kristeen Miss, MD        Medication Side Effects: None  Allergies:  Allergies  Allergen Reactions   Effexor [Venlafaxine Hcl]     Weight loss and cant sleep   Flexeril [Cyclobenzaprine] Hives    Past Medical History:  Diagnosis Date   Back pain of lumbar region with sciatica    Cancer ([place])    breast   Diabetes mellitus, type II ([place])    Family history of anesthesia complication    grandfather died under anesthesia 98's- had heart issues   H/O carpal tunnel repair 2014   Headache(784.0)    Hyperlipidemia    Hypertension    Hypothyroidism    PONV (postoperative nausea and vomiting)    Sleep apnea    cpap since 10    Family History  Problem Relation Age of Onset   Hyperlipidemia Mother    Heart disease Father    Hypertension Father    Diabetes Father    Cancer Maternal Aunt        Breast - In 49's   Cancer Paternal Aunt        Breast - in 78's   Cancer Maternal Grandmother        breast cancer   Diabetes Paternal Grandmother    Heart disease Paternal Grandfather    Hypertension Paternal Grandfather    Suicidality Other    Sleep apnea Neg Hx     Social History   Socioeconomic History   Marital status: Married    Spouse name: Clair Gulling   Number of children: 3   Years of education: college   Highest education level: Not on file  Occupational History   Occupation: N/A  Tobacco Use   Smoking status: Never   Smokeless tobacco: Never   Tobacco comments:    occ alcohol  Vaping Use   Vaping Use: Never used  Substance and Sexual Activity  Alcohol use: Yes    Comment: occ   Drug use: No   Sexual activity: Not on file  Other Topics Concern   Not on  file  Social History Narrative   Denies caffeine use    Social Determinants of Health   Financial Resource Strain: Not on file  Food Insecurity: No Food Insecurity (12/28/2021)   Hunger Vital Sign    Worried About Running Out of Food in the Last Year: Never true    Ran Out of Food in the Last Year: Never true  Transportation Needs: No Transportation Needs (12/28/2021)   PRAPARE - Hydrologist (Medical): No    Lack of Transportation (Non-Medical): No  Physical Activity: Not on file  Stress: Not on file  Social Connections: Not on file  Intimate Partner Violence: Unknown (12/28/2021)   Humiliation, Afraid, Rape, and Kick questionnaire    Fear of Current or Ex-Partner: No    Emotionally Abused: No    Physically Abused: No    Sexually Abused: Not on file    Past Medical History, Surgical history, Social history, and Family history were reviewed and updated as appropriate.   Please see review of systems for further details on the patient's review from today.   Objective:   Physical Exam:  LMP 11/12/2013   Physical Exam Neurological:     Mental Status: She is alert and oriented to person, place, and time.     Cranial Nerves: No dysarthria.  Psychiatric:        Attention and Perception: Attention and perception normal.        Mood and Affect: Mood is anxious and depressed.        Speech: Speech normal.        Behavior: Behavior is cooperative.        Thought Content: Thought content normal. Thought content is not paranoid or delusional. Thought content does not include homicidal or suicidal ideation. Thought content does not include homicidal or suicidal plan.        Cognition and Memory: Cognition and memory normal.        Judgment: Judgment normal.     Comments: Insight intact     Lab Review:     Component Value Date/Time   NA 137 12/28/2021 0626   K 3.7 12/28/2021 0626   CL 104 12/28/2021 0626   CO2 25 12/28/2021 0626   GLUCOSE 127 (H)  12/28/2021 0626   BUN 7 12/28/2021 0626   CREATININE 0.66 12/28/2021 0626   CREATININE 0.73 01/19/2018 1556   CALCIUM 8.4 (L) 12/28/2021 0626   PROT 6.8 01/19/2018 1556   ALBUMIN 3.6 01/19/2018 1556   AST 15 01/19/2018 1556   ALT 15 01/19/2018 1556   ALKPHOS 53 01/19/2018 1556   BILITOT 0.3 01/19/2018 1556   GFRNONAA >60 12/28/2021 0626   GFRNONAA >60 01/19/2018 1556   GFRAA >60 01/19/2018 1556       Component Value Date/Time   WBC 7.7 12/28/2021 0626   RBC 4.14 12/28/2021 0626   HGB 12.2 12/28/2021 0626   HGB 12.2 01/19/2018 1556   HCT 36.7 12/28/2021 0626   PLT 181 12/28/2021 0626   PLT 263 01/19/2018 1556   MCV 88.6 12/28/2021 0626   MCH 29.5 12/28/2021 0626   MCHC 33.2 12/28/2021 0626   RDW 13.0 12/28/2021 0626   LYMPHSABS 2.3 01/19/2018 1556   MONOABS 0.6 01/19/2018 1556   EOSABS 0.6 (H) 01/19/2018 1556   BASOSABS 0.1 01/19/2018 1556  No results found for: "POCLITH", "LITHIUM"   No results found for: "PHENYTOIN", "PHENOBARB", "VALPROATE", "CBMZ"   .res Assessment: Plan:    Pt seen for 45 minutes and time spent discussing most recent symptoms and adjustment process after surgery.  Will increase Viibryd to 20 mg po qd with breakfast to improve mood and anxiety.  Discussed taking Gabapentin 100 mg around 6:30-7 pm to possibly help with both neuropathy and anxiety in the evening since she reports both of these symptoms typically are worse around 8 pm.  Continue Gabapentin 300 mg po QHS for insomnia and anxiety.  Discussed using the lowest effective dose of Alprazolam possible to minimize risk of dependence and tolerance. Continue Dayvigo 10 mg po QHS for insomnia.  Continue Doxazosin 4 mg po QHS for insomnia.  Recommend continuing psychotherapy with Beckey Downing, LCAS. Pt to follow-up with this provider in 4 weeks or sooner if clinically indicated.  Patient advised to contact office with any questions, adverse effects, or acute worsening in signs and  symptoms.   [name] was seen today for anxiety and depression.  Diagnoses and all orders for this visit:  PTSD (post-traumatic stress disorder)  Generalized anxiety disorder  Depression, unspecified depression type  Insomnia, unspecified type     Please see After Visit Summary for patient specific instructions.  Future Appointments  Date Time Provider [place]  03/22/2022  3:00 PM Jaquita Folds, MD [facility]  06/12/2022 11:00 AM Ward Givens, NP [facility] None    No orders of the defined types were placed in this encounter.     -------------------------------

## 2022-03-21 ENCOUNTER — Ambulatory Visit (INDEPENDENT_AMBULATORY_CARE_PROVIDER_SITE_OTHER): Payer: No Typology Code available for payment source | Admitting: Obstetrics and Gynecology

## 2022-03-21 ENCOUNTER — Encounter: Payer: Self-pay | Admitting: Obstetrics and Gynecology

## 2022-03-21 VITALS — BP 128/82 | HR 67 | Ht 61.25 in | Wt 219.0 lb

## 2022-03-21 DIAGNOSIS — R350 Frequency of micturition: Secondary | ICD-10-CM

## 2022-03-21 DIAGNOSIS — M62838 Other muscle spasm: Secondary | ICD-10-CM | POA: Diagnosis not present

## 2022-03-21 DIAGNOSIS — N3941 Urge incontinence: Secondary | ICD-10-CM

## 2022-03-21 DIAGNOSIS — N393 Stress incontinence (female) (male): Secondary | ICD-10-CM

## 2022-03-21 LAB — POCT URINALYSIS DIPSTICK
Bilirubin, UA: NEGATIVE
Glucose, UA: NEGATIVE
Ketones, UA: NEGATIVE
Leukocytes, UA: NEGATIVE
Nitrite, UA: NEGATIVE
Protein, UA: NEGATIVE
Spec Grav, UA: 1.02 (ref 1.010–1.025)
Urobilinogen, UA: 0.2 E.U./dL
pH, UA: 5.5 (ref 5.0–8.0)

## 2022-03-21 NOTE — Patient Instructions (Signed)
For treatment of stress urinary incontinence, which is leakage with physical activity/movement/strainging/coughing, we discussed expectant management versus nonsurgical options versus surgery. Nonsurgical options include weight loss, physical therapy, as well as a pessary.  Surgical options include a midurethral sling, which is a synthetic mesh sling that acts like a hammock under the urethra to prevent leakage of urine, and transurethral injection of a bulking agent.
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
## 2022-03-22 ENCOUNTER — Ambulatory Visit: Payer: No Typology Code available for payment source | Admitting: Obstetrics and Gynecology

## 2022-03-28 ENCOUNTER — Encounter: Payer: Self-pay | Admitting: Psychiatry

## 2022-03-28 ENCOUNTER — Telehealth (INDEPENDENT_AMBULATORY_CARE_PROVIDER_SITE_OTHER): Payer: No Typology Code available for payment source | Admitting: Psychiatry

## 2022-03-28 DIAGNOSIS — F411 Generalized anxiety disorder: Secondary | ICD-10-CM | POA: Diagnosis not present

## 2022-03-28 DIAGNOSIS — G4700 Insomnia, unspecified: Secondary | ICD-10-CM

## 2022-03-28 DIAGNOSIS — F4310 Post-traumatic stress disorder, unspecified: Secondary | ICD-10-CM | POA: Diagnosis not present

## 2022-03-28 DIAGNOSIS — F32A Depression, unspecified: Secondary | ICD-10-CM | POA: Diagnosis not present

## 2022-03-28 MED ORDER — VILAZODONE HCL 20 MG PO TABS: 20.0000 mg | ORAL_TABLET | Freq: Every day | ORAL | 0 refills | Status: DC

## 2022-03-28 MED ORDER — ALPRAZOLAM 0.5 MG PO TABS: ORAL_TABLET | ORAL | 2 refills | Status: DC

## 2022-03-28 MED ORDER — DOXAZOSIN MESYLATE 4 MG PO TABS: 4.0000 mg | ORAL_TABLET | Freq: Every day | ORAL | 1 refills | Status: DC
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
## 2022-04-17 ENCOUNTER — Ambulatory Visit: Payer: No Typology Code available for payment source | Admitting: Cardiology

## 2022-04-17 ENCOUNTER — Encounter: Payer: Self-pay | Admitting: Cardiology

## 2022-04-17 VITALS — BP 135/64 | HR 66 | Ht 61.25 in | Wt 221.0 lb

## 2022-04-17 DIAGNOSIS — R931 Abnormal findings on diagnostic imaging of heart and coronary circulation: Secondary | ICD-10-CM | POA: Insufficient documentation

## 2022-04-17 DIAGNOSIS — R0609 Other forms of dyspnea: Secondary | ICD-10-CM | POA: Insufficient documentation

## 2022-04-17 MED ORDER — ATORVASTATIN CALCIUM 20 MG PO TABS: 20.0000 mg | ORAL_TABLET | Freq: Every day | ORAL | 3 refills | Status: DC

## 2022-04-17 MED ORDER — NITROGLYCERIN 0.4 MG SL SUBL: 0.4000 mg | SUBLINGUAL_TABLET | SUBLINGUAL | 3 refills | Status: DC | PRN

## 2022-04-17 MED ORDER — ASPIRIN 81 MG PO TBEC: 81.0000 mg | DELAYED_RELEASE_TABLET | Freq: Every day | ORAL | 3 refills | Status: DC
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
## 2022-04-22 NOTE — Therapy (Unsigned)
OUTPATIENT PHYSICAL THERAPY FEMALE PELVIC EVALUATION   Patient Name: [name] MRN: [id number] DOB:1960-06-20, 62 y.o., female Today's Date: 04/23/2022  END OF SESSION:  PT End of Session - 04/23/22 0948     Visit Number 1    Date for PT Re-Evaluation 07/16/22    Authorization Type [employer]    PT Start Time 0945    PT Stop Time 1020    PT Time Calculation (min) 35 min    Activity Tolerance Patient tolerated treatment well    Behavior During Therapy WFL for tasks assessed/performed             Past Medical History:  Diagnosis Date   Back pain of lumbar region with sciatica    Cancer    breast   Diabetes mellitus, type II    Family history of anesthesia complication    grandfather died under anesthesia 3's- had heart issues   H/O carpal tunnel repair 2014   Headache(784.0)    Hyperlipidemia    Hypertension    Hypothyroidism    Medial meniscus tear    PONV (postoperative nausea and vomiting)    Sleep apnea    cpap since 10   Past Surgical History:  Procedure Laterality Date   BILATERAL TOTAL MASTECTOMY WITH AXILLARY LYMPH NODE DISSECTION     BREAST RECONSTRUCTION     CERVICAL [place] ARTHROPLASTY N/A 03/19/2013   Procedure: CERVICAL FIVE TO SIX, CERVICAL SIX TO SEVEN CERVICAL ANTERIOR [place] ARTHROPLASTY;  Surgeon: Kristeen Miss, MD;  Location: [facility];  Service: Neurosurgery;  Laterality: N/A;  C56 C67 artificial disc replacement   CESAREAN SECTION     COLONOSCOPY     DILATATION & CURETTAGE/HYSTEROSCOPY WITH TRUECLEAR N/A 11/25/2013   Procedure: DILATATION & CURETTAGE/HYSTEROSCOPY WITH TRUCLEAR;  Surgeon: Margarette Asal, MD;  Location: [facility];  Service: Gynecology;  Laterality: N/A;   ECTOPIC PREGNANCY SURGERY     GALLBLADDER SURGERY     MOUTH SURGERY     child- dog bite   SPINAL FUSION     2023   TONSILLECTOMY     TUBAL LIGATION     Patient Active Problem List   Diagnosis Date Noted   Exertional dyspnea 04/17/2022   Elevated coronary artery calcium  score 04/17/2022   Spondylolisthesis at L4-L5 level 12/27/2021   Ductal carcinoma in situ (DCIS) of left breast 01/19/2018   Generalized anxiety disorder 11/07/2017   Depression 11/07/2017   Insomnia 11/07/2017   Cervical spondylosis 03/19/2013    PCP: Shon Baton, MD  REFERRING PROVIDER: Jaquita Folds, MD   REFERRING DIAG: [phone] (ICD-10-CM) - Levator spasm   THERAPY DIAG:  No diagnosis found.  Rationale for Evaluation and Treatment: Rehabilitation  ONSET DATE: 2021  SUBJECTIVE:  SUBJECTIVE STATEMENT: Has done biofeedback at [facility] office, she does feel it helped some. Patient is not able to get to the bathroom in the middle of the night. Patient will be having therapy for her back when she is cleared from her doctor.  Fluid intake: Drinks: 1 cup coffee in AM, 50-60oz water per day. Drinks water before she goes to bed, but usually smaller sips later.    PAIN:  Are you having pain? Yes NPRS scale: 5/10 Pain location:  back  Pain type: pins and needles Pain description: constant   Aggravating factors: moving, sitting  Relieving factors: rest  PRECAUTIONS: Other: recent lumbar fusion 12/23  WEIGHT BEARING RESTRICTIONS: No  FALLS:  Has patient fallen in last 6 months? No  LIVING ENVIRONMENT: Lives with: lives with their daughter  OCCUPATION: recovering from back surgery  PLOF: Independent  PATIENT GOALS: reduce urinary leakage, not have to clean up bathroom in middle of night due to accident  PERTINENT HISTORY:  Breast cancer; DM; Hypothyroidism; sleep apnea; bilateral total mastectomy; c-section; cervical disc arthroplasty; Gallbladder removal; L3-L5 spinal fusion 01/16/22 Sexual abuse: No  BOWEL MOVEMENT: Pain with bowel movement: No Type of bowel movement:Type  ([place] Stool Scale) varies, Frequency every 2-3 days, Strain Yes, and Splinting yes Fully empty rectum: No due to back surgery  Leakage: No Fiber supplement: Yes: stool softner  URINATION: Pain with urination: No Fully empty bladder: No, Has to lean forward or backward to empty completely ; When urinating, she feels difficulty starting urine stream, dribbling after finishing, and the need to urinate multiple times in a row  Stream: Strong Urgency: Yes:   patient does not wake up in time to go to the bathroom and still recovering from back surger.  Frequency: Day time voids 6.  Nocturia: 1-2 times per night to void.  Leakage: Walking to the bathroom, Coughing, Sneezing, Exercise, Intercourse, and going from sitting to standing, with a full bladder, without sensation ; patient gets the urge to urinate in standing and not sitting.  Pads: No  INTERCOURSE: not sexually active  PREGNANCY: C-section deliveries 3   OBJECTIVE:   DIAGNOSTIC FINDINGS:  Pelvic floor strength I/V   PVR of 22 ml was obtained by bladder scan    COGNITION: Overall cognitive status: Within functional limits for tasks assessed     SENSATION: Light touch: Appears intact Proprioception: Appears intact   GAIT: Distance walked: 200 feet Assistive device utilized: Single point cane Level of assistance: Complete Independence Comments: recovering from back fusion  POSTURE: rounded shoulders, forward head, and decreased lumbar lordosis  PELVIC ALIGNMENT:  LUMBARAROM/PROM: not tested due to recent back surgery   LOWER EXTREMITY ROM:  Passive ROM Right eval Left eval  Hip flexion 115 110  Hip internal rotation -5 0  Hip external rotation 70 70   (Blank rows = not tested)     PALPATION:   General  unable to tighten her lower abdominals, tenderness located in the lower abdominal, restrictions of the c-section scar                External Perineal Exam vaginal dryness                              Internal Pelvic Floor tenderness located on bilateral levator ani, obturator internist, sides of the urethra and bladder  Patient confirms identification and approves PT to assess internal pelvic floor and treatment Yes  PELVIC MMT:  MMT eval  Vaginal 3/5 anterior and posterior 2/5 laterally with right side weaker  (Blank rows = not tested)        TONE: increased   TODAY'S TREATMENT:                                                                                                                              DATE: 04/23/22  EVAL eval completed   PATIENT EDUCATION:  Education details: educated patient on what will be done in therapy, treatment plan and reviewed her goals.  Person educated: Patient Education method: Explanation Education comprehension: verbalized understanding  HOME EXERCISE PROGRAM: See above.   ASSESSMENT:  CLINICAL IMPRESSION: Patient is a 62 y.o. female who was seen today for physical therapy evaluation and treatment for levator ani spasm. She had lumbar fusion from L3-L5 on 12/23 and is presently walking with a cane due to this. Pelvic floor strength is 3/5 anteriorly and posteriorly and the sides is 2/5. She has tenderness located in bilateral levator ani, obturator internist, sides of bladder and urethra. Patient leaks urine with walking to the bathroom, coughing, sneezing, exercise, intercourse, and going from sitting to standing, with a full bladder, and without sensation.  Patient gets the urge to urinate in standing and not sitting. She has decreased bilateral hip internal rotation and flexion. She has restrictions in her cesarean scar and tenderness in the lower abdominals. Patient will benefit from skilled therapy to elongate her pelvic floor so she will be able to coordinate the muscles to reduce her urinary leakage.   OBJECTIVE IMPAIRMENTS: decreased endurance, decreased ROM, decreased strength, increased fascial restrictions, and increased muscle spasms.    ACTIVITY LIMITATIONS: sitting, standing, transfers, continence, and locomotion level  PARTICIPATION LIMITATIONS: meal prep, cleaning, laundry, and community activity  PERSONAL FACTORS: Age, Past/current experiences, Time since onset of injury/illness/exacerbation, and 3+ comorbidities: Breast cancer; DM; Hypothyroidism; sleep apnea; bilateral total mastectomy; c-section; cervical disc arthroplasty; Gallbladder removal; L3-L5 spinal fusion 01/16/22  are also affecting patient's functional outcome.   REHAB POTENTIAL: Good  CLINICAL DECISION MAKING: Evolving/moderate complexity  EVALUATION COMPLEXITY: Moderate   GOALS: Goals reviewed with patient? Yes  SHORT TERM GOALS: Target date: 05/20/22  Patient is independent with initial HEP  for c-section scar massage to improve tissue mobility.  Baseline: Goal status: INITIAL  2.  Patient is independent with initial HEP for hip mobility and pelvic floor drop to lengthen the muscles.  Baseline:  Goal status: INITIAL  3.  Patient able to perform diaphragmatic breathing to relax her pelvic floor.  Baseline:  Goal status: INITIAL   LONG TERM GOALS: Target date: 07/16/22  Patient independent with advanced HEP for pelvic floor strengthening and elongation to reduce her urinary leakage.  Baseline:  Goal status: INITIAL  2.  Pelvic floor strength is >/= 3/5 with circular hug of therapist finger to reduce her urinary leakage.  Baseline:  Goal status: INITIAL  3.  Patient is able to wake  up in the middle of the night and walk to the bathroom without leaking urine due to improve pelvic floor coordination.  Baseline:  Goal status: INITIAL  4.   Urinary leakage with coughing, sneezing and laughing is reduced >/= 75% due to improved pelvic floor strength and coordination.  Baseline:  Goal status: INITIAL   5.  Bilateral hip flexion P/ROM is >/= 115 degrees so she is able to sit on the commode and fully empty her bladder.  Baseline:  Goal  status: INITIAL   PLAN:  PT FREQUENCY: 1-2x/week  PT DURATION: 12 weeks  PLANNED INTERVENTIONS: Therapeutic exercises, Therapeutic activity, Neuromuscular re-education, Patient/Family education, Joint mobilization, Aquatic Therapy, Dry Needling, Electrical stimulation, Cryotherapy, Moist heat, scar mobilization, Taping, Ultrasound, Biofeedback, and Manual therapy  PLAN FOR NEXT SESSION: manual work to the pelvic floor, diaphragmatic breathing, transitional movement education with engaging the abdominals to not strain the pelvic floor.    Earlie Counts, PT 04/23/22 3:01 PM

## 2022-04-23 ENCOUNTER — Encounter: Payer: Self-pay | Admitting: Physical Therapy

## 2022-04-23 ENCOUNTER — Encounter
Payer: No Typology Code available for payment source | Attending: Obstetrics and Gynecology | Admitting: Physical Therapy

## 2022-04-23 ENCOUNTER — Other Ambulatory Visit: Payer: Self-pay

## 2022-04-23 DIAGNOSIS — M6281 Muscle weakness (generalized): Secondary | ICD-10-CM

## 2022-04-23 DIAGNOSIS — N3941 Urge incontinence: Secondary | ICD-10-CM | POA: Diagnosis present

## 2022-05-06 ENCOUNTER — Ambulatory Visit: Payer: No Typology Code available for payment source

## 2022-05-06 DIAGNOSIS — R931 Abnormal findings on diagnostic imaging of heart and coronary circulation: Secondary | ICD-10-CM

## 2022-05-08 ENCOUNTER — Encounter: Payer: Self-pay | Admitting: Psychiatry

## 2022-05-08 ENCOUNTER — Ambulatory Visit (INDEPENDENT_AMBULATORY_CARE_PROVIDER_SITE_OTHER): Payer: No Typology Code available for payment source | Admitting: Psychiatry

## 2022-05-08 DIAGNOSIS — G4700 Insomnia, unspecified: Secondary | ICD-10-CM

## 2022-05-08 DIAGNOSIS — F411 Generalized anxiety disorder: Secondary | ICD-10-CM | POA: Diagnosis not present

## 2022-05-08 DIAGNOSIS — F4310 Post-traumatic stress disorder, unspecified: Secondary | ICD-10-CM | POA: Diagnosis not present

## 2022-05-08 MED ORDER — GABAPENTIN 100 MG PO CAPS
100.0000 mg | ORAL_CAPSULE | Freq: Two times a day (BID) | ORAL | 2 refills | Status: DC | PRN
Start: 2022-05-08 — End: 2022-08-02

## 2022-05-08 MED ORDER — GABAPENTIN 300 MG PO CAPS
300.0000 mg | ORAL_CAPSULE | Freq: Every day | ORAL | 0 refills | Status: DC
Start: 2022-05-08 — End: 2022-08-02

## 2022-05-08 NOTE — Progress Notes (Signed)
[name] [id number] 25-Nov-1960 62 y.o.  Subjective:   Patient ID:  [name] is a 62 y.o. (DOB May 04, 1960) female.  Chief Complaint:  Chief Complaint  Patient presents with   Anxiety   Depression    Anxiety Symptoms include chest pain, palpitations and shortness of breath.    Depression        Past medical history includes anxiety.    [name] presents to the office today for follow-up of anxiety, depressed mood, and insomnia. She reports that she has had a couple of weeks of not wanting to get out of bed following family stressors. "Very depressed." She reports that her appetite was decreased during this time. She reports that she has had severe family and marital stress. She continues to work on setting boundaries with family. She reports that she became tearful during visit with cardiologist while discussing family history. Reports frequent crying and tearfulness. Reports, "it's mostly situational." She reports having panic attacks at times, more than once a week. She reports anxiety has improved some since setting boundaries with family members and processing these events with therapist. Appetite is improving some. She reports that she has lost 40 lbs since marital and family stress. She reports difficulty with concentration and "I have a hard time reading." She reports occasionally self-soothing by buying clothing. Contracts for safety- "I will not do that to my children." Denies SI at time of exam.   She reports difficulty falling asleep and is staying up later as a result and then sometimes sleeping later. She is not sure if Viibryd is contributing to her insomnia.    Past Psychiatric Medication Trials: Ambien- Has been taking 5-10 mg Intermezzo- Helpful for sleep initiation Xanax Effexor XR-Multiple adverse effects. Severe discontinuation s/s. Zonisamide- SI. Lexapro-Concentration difficulty Viibryd Elavil- Apatheric, difficulty with work Topamax-  Anger, irritability Benadryl- partially effective.  Dayvigo- Effective. Has mild drowsiness the following day.  Doxepin- Excessive sedation Doxazosin Gabapentin  Flowsheet Row Admission (Discharged) from 12/27/2021 in [facility] Pre-Admission Testing 60 from 12/21/2021 in [facility] PREADMISSION TESTING  C-SSRS RISK CATEGORY No Risk No Risk        Review of Systems:  Review of Systems  Respiratory:  Positive for shortness of breath.   Cardiovascular:  Positive for chest pain and palpitations.  Musculoskeletal:  Positive for gait problem.  Psychiatric/Behavioral:  Positive for depression.        Please refer to HPI    Medications: I have reviewed the patient's current medications.  Current Outpatient Medications  Medication Sig Dispense Refill   atorvastatin (LIPITOR) 20 MG tablet Take 1 tablet (20 mg total) by mouth daily. 90 tablet 3   ALPRAZolam (XANAX) 0.5 MG tablet Take 1/2-1 tablet three times daily as needed for anxiety or insomnia 75 tablet 2   aspirin EC 81 MG tablet Take 1 tablet (81 mg total) by mouth daily. 90 tablet 3   doxazosin (CARDURA) 4 MG tablet Take 1 tablet (4 mg total) by mouth at bedtime. 90 tablet 1   famotidine-calcium carbonate-magnesium hydroxide (PEPCID COMPLETE) 10-800-165 MG chewable tablet Chew 1 tablet by mouth daily as needed.     gabapentin (NEURONTIN) 100 MG capsule Take 1 capsule (100 mg total) by mouth 2 (two) times daily as needed (anxiety). 60 capsule 2   gabapentin (NEURONTIN) 300 MG capsule Take 1 capsule (300 mg total) by mouth at bedtime. 90 capsule 0   Galcanezumab-gnlm 120 MG/ML SOAJ Inject 120  mg into the skin every 30 (thirty) days.     hydrochlorothiazide (MICROZIDE) 12.5 MG capsule Take 12.5 mg by mouth daily.     ibuprofen (ADVIL) 200 MG tablet Take 1 tablet (200 mg total) by mouth every 6 (six) hours as needed. 30 tablet 0   JARDIANCE 10 MG TABS tablet Take 10 mg by mouth every morning.      Lemborexant (DAYVIGO) 10 MG TABS Take 1 tablet (10 mg total) by mouth at bedtime. 90 tablet 1   Lidocaine 0.5 % AERO Place 1 spray into both nostrils See admin instructions. CAN ONLY TAKE ONCE WEEKLY FOR MIGRAINES     metFORMIN (GLUCOPHAGE) 500 MG tablet Take 500 mg by mouth daily with supper.     methocarbamol (ROBAXIN) 500 MG tablet Take 1 tablet (500 mg total) by mouth every 6 (six) hours as needed for muscle spasms. 30 tablet 3   metoprolol succinate (TOPROL-XL) 100 MG 24 hr tablet 1 tablet Orally Once a day for 90 days     naproxen sodium (ALEVE) 220 MG tablet Take 220 mg by mouth.     nitroGLYCERIN (NITROSTAT) 0.4 MG SL tablet Place 1 tablet (0.4 mg total) under the tongue every 5 (five) minutes as needed for chest pain. 90 tablet 3   OnabotulinumtoxinA (BOTOX IM) Inject into the muscle every 3 (three) months. FOR MIGRAINES     rizatriptan (MAXALT-MLT) 10 MG disintegrating tablet Take 10 mg by mouth as needed for migraine.      Simethicone (GAS-X PO) Take 1 capsule by mouth daily as needed (gas).     Tetrahydrozoline HCl (VISINE OP) Place 1 drop into both eyes daily as needed (irritation).     thyroid (ARMOUR) 30 MG tablet Take 30 mg by mouth daily before breakfast.      tiZANidine (ZANAFLEX) 4 MG tablet Take 4 mg by mouth daily as needed (MIGRAINES).     UBRELVY 50 MG TABS Take 50 mg by mouth daily as needed (migraine).     Vilazodone HCl 20 MG TABS Take 1 tablet (20 mg total) by mouth daily with breakfast. 90 tablet 0   No current facility-administered medications for this visit.   Facility-Administered Medications Ordered in Other Visits  Medication Dose Route Frequency Provider Last Rate Last Admin   ondansetron (ZOFRAN) 4 mg in sodium chloride 0.9 % 50 mL IVPB  4 mg Intravenous Q6H PRN Barnett Abu, MD        Medication Side Effects: Other: Possible sleep disturbances  Allergies:  Allergies  Allergen Reactions   Cyclobenzaprine Hives and Other (See Comments)   Effexor  [Venlafaxine Hcl]     Weight loss and cant sleep   Linaclotide Other (See Comments)   Venlafaxine Other (See Comments)    Other Reaction(s): anxiety/ extreme weight loss    Past Medical History:  Diagnosis Date   Back pain of lumbar region with sciatica    Cancer    breast   Diabetes mellitus, type II    Family history of anesthesia complication    grandfather died under anesthesia 70's- had heart issues   H/O carpal tunnel repair 2014   Headache(784.0)    Hyperlipidemia    Hypertension    Hypothyroidism    Medial meniscus tear    PONV (postoperative nausea and vomiting)    Sleep apnea    cpap since 10    Past Medical History, Surgical history, Social history, and Family history were reviewed and updated as appropriate.   Please see  review of systems for further details on the patient's review from today.   Objective:   Physical Exam:  Wt 215 lb (97.5 kg)   LMP 11/12/2013   BMI 40.29 kg/m   Physical Exam Constitutional:      General: She is not in acute distress. Musculoskeletal:        General: No deformity.  Neurological:     Mental Status: She is alert and oriented to person, place, and time.     Coordination: Coordination normal.  Psychiatric:        Attention and Perception: Attention and perception normal. She does not perceive auditory or visual hallucinations.        Mood and Affect: Mood is anxious and depressed. Affect is not labile, blunt, angry or inappropriate.        Speech: Speech normal.        Behavior: Behavior normal.        Thought Content: Thought content normal. Thought content is not paranoid or delusional. Thought content does not include homicidal or suicidal ideation. Thought content does not include homicidal or suicidal plan.        Cognition and Memory: Cognition and memory normal.        Judgment: Judgment normal.     Comments: Insight intact     Lab Review:     Component Value Date/Time   NA 137 12/28/2021 0626   K 3.7  12/28/2021 0626   CL 104 12/28/2021 0626   CO2 25 12/28/2021 0626   GLUCOSE 127 (H) 12/28/2021 0626   BUN 7 12/28/2021 0626   CREATININE 0.66 12/28/2021 0626   CREATININE 0.73 01/19/2018 1556   CALCIUM 8.4 (L) 12/28/2021 0626   PROT 6.8 01/19/2018 1556   ALBUMIN 3.6 01/19/2018 1556   AST 15 01/19/2018 1556   ALT 15 01/19/2018 1556   ALKPHOS 53 01/19/2018 1556   BILITOT 0.3 01/19/2018 1556   GFRNONAA >60 12/28/2021 0626   GFRNONAA >60 01/19/2018 1556   GFRAA >60 01/19/2018 1556       Component Value Date/Time   WBC 7.7 12/28/2021 0626   RBC 4.14 12/28/2021 0626   HGB 12.2 12/28/2021 0626   HGB 12.2 01/19/2018 1556   HCT 36.7 12/28/2021 0626   PLT 181 12/28/2021 0626   PLT 263 01/19/2018 1556   MCV 88.6 12/28/2021 0626   MCH 29.5 12/28/2021 0626   MCHC 33.2 12/28/2021 0626   RDW 13.0 12/28/2021 0626   LYMPHSABS 2.3 01/19/2018 1556   MONOABS 0.6 01/19/2018 1556   EOSABS 0.6 (H) 01/19/2018 1556   BASOSABS 0.1 01/19/2018 1556    No results found for: "POCLITH", "LITHIUM"   No results found for: "PHENYTOIN", "PHENOBARB", "VALPROATE", "CBMZ"   .res Assessment: Plan:    Patient seen for 30 minutes and time spent discussing recent stressors and the effects this has had on her mood and anxiety.  She reports meeting with therapist yesterday and that both she and her therapist agree that anxiety is mostly situational.  Patient also reports having shortness of breath and having cardiology workup.  She is also planning to start physical therapy.  Discussed continuing current medications without changes for the next month and monitoring mood and anxiety closely in response to upcoming changes. Will continue gabapentin 300 mg at bedtime for anxiety and insomnia. Continue gabapentin 100 mg twice daily for anxiety. Continue vilazodone 20 mg daily for depression and anxiety. Continue alprazolam as needed for anxiety and insomnia. Continue doxazosin 4 mg at bedtime  for  nightmares. Recommend continuing psychotherapy with Bradley Ferris, LCAS.  Pt to follow-up with this provider in 4 weeks or sooner if clinically indicated.  Patient advised to contact office with any questions, adverse effects, or acute worsening in signs and symptoms.   [name] was seen today for anxiety and depression.  Diagnoses and all orders for this visit:  Generalized anxiety disorder -     gabapentin (NEURONTIN) 300 MG capsule; Take 1 capsule (300 mg total) by mouth at bedtime. -     gabapentin (NEURONTIN) 100 MG capsule; Take 1 capsule (100 mg total) by mouth 2 (two) times daily as needed (anxiety).  PTSD (post-traumatic stress disorder) -     gabapentin (NEURONTIN) 300 MG capsule; Take 1 capsule (300 mg total) by mouth at bedtime.  Insomnia, unspecified type -     gabapentin (NEURONTIN) 300 MG capsule; Take 1 capsule (300 mg total) by mouth at bedtime. -     gabapentin (NEURONTIN) 100 MG capsule; Take 1 capsule (100 mg total) by mouth 2 (two) times daily as needed (anxiety).     Please see After Visit Summary for patient specific instructions.  Future Appointments  Date Time Provider Department Center  05/09/2022 10:30 AM Theressa Millard, PT [facility]  05/16/2022  1:00 PM [facility]ECHO/VAS 1 [facility]IMG None  05/16/2022  3:00 PM Theressa Millard, PT [facility]  05/21/2022  2:00 PM Theressa Millard, PT [facility]  05/30/2022 10:30 AM Theressa Millard, PT [facility]  06/03/2022 10:15 AM Theressa Millard, PT [facility] None  06/03/2022 11:45 AM Elder Negus, MD [facility]-PCV None  06/07/2022 12:45 PM Corie Chiquito, PMHNP CP-CP None  06/10/2022 12:30 PM Theressa Millard, PT [facility] None  06/12/2022  8:45 AM Theressa Millard, PT [facility] None  06/12/2022 11:00 AM Butch Penny, NP [facility] None  06/18/2022 11:30 AM Theressa Millard, PT [facility]  06/21/2022 10:15 AM Theressa Millard, PT [facility] None  06/25/2022 11:30 AM Theressa Millard, PT [facility]  06/27/2022 10:30 AM Theressa Millard, PT [facility]   07/02/2022 10:30 AM Theressa Millard, PT [facility]  07/04/2022 11:30 AM Theressa Millard, PT [facility]  07/09/2022 11:30 AM Theressa Millard, PT [facility]  07/11/2022 11:30 AM Theressa Millard, PT [facility]  07/16/2022 11:30 AM Theressa Millard, PT [facility]  07/22/2022  8:40 AM Selmer Dominion, NP [facility]    No orders of the defined types were placed in this encounter.   -------------------------------

## 2022-05-09 ENCOUNTER — Encounter: Payer: Self-pay | Admitting: Physical Therapy

## 2022-05-09 ENCOUNTER — Encounter: Payer: No Typology Code available for payment source | Admitting: Physical Therapy

## 2022-05-09 DIAGNOSIS — N3941 Urge incontinence: Secondary | ICD-10-CM

## 2022-05-09 DIAGNOSIS — M6281 Muscle weakness (generalized): Secondary | ICD-10-CM

## 2022-05-09 NOTE — Therapy (Signed)
OUTPATIENT PHYSICAL THERAPY TREATMENT NOTE   Patient Name: [name] MRN: [id number] DOB:1960/09/23, 62 y.o., female Today's Date: 05/09/2022  PCP: Creola Corn, MD  REFERRING PROVIDER: Marguerita Beards, MD   END OF SESSION:   PT End of Session - 05/09/22 1038     Visit Number 2    Date for PT Re-Evaluation 07/16/22    Authorization Type [employer]    PT Start Time 1030    PT Stop Time 1115    PT Time Calculation (min) 45 min    Activity Tolerance Patient tolerated treatment well    Behavior During Therapy WFL for tasks assessed/performed             Past Medical History:  Diagnosis Date   Back pain of lumbar region with sciatica    Cancer    breast   Diabetes mellitus, type II    Family history of anesthesia complication    grandfather died under anesthesia 70's- had heart issues   H/O carpal tunnel repair 2014   Headache(784.0)    Hyperlipidemia    Hypertension    Hypothyroidism    Medial meniscus tear    PONV (postoperative nausea and vomiting)    Sleep apnea    cpap since 10   Past Surgical History:  Procedure Laterality Date   BILATERAL TOTAL MASTECTOMY WITH AXILLARY LYMPH NODE DISSECTION     BREAST RECONSTRUCTION     CERVICAL DISC ARTHROPLASTY N/A 03/19/2013   Procedure: CERVICAL FIVE TO SIX, CERVICAL SIX TO SEVEN CERVICAL ANTERIOR DISC ARTHROPLASTY;  Surgeon: Barnett Abu, MD;  Location: [facility];  Service: Neurosurgery;  Laterality: N/A;  C56 C67 artificial disc replacement   CESAREAN SECTION     COLONOSCOPY     DILATATION & CURETTAGE/HYSTEROSCOPY WITH TRUECLEAR N/A 11/25/2013   Procedure: DILATATION & CURETTAGE/HYSTEROSCOPY WITH TRUCLEAR;  Surgeon: Meriel Pica, MD;  Location: [facility]S;  Service: Gynecology;  Laterality: N/A;   ECTOPIC PREGNANCY SURGERY     GALLBLADDER SURGERY     MOUTH SURGERY     child- dog bite   SPINAL FUSION     2023   TONSILLECTOMY     TUBAL LIGATION     Patient Active Problem List   Diagnosis Date Noted    Exertional dyspnea 04/17/2022   Elevated coronary artery calcium score 04/17/2022   Spondylolisthesis at L4-L5 level 12/27/2021   Ductal carcinoma in situ (DCIS) of left breast 01/19/2018   Generalized anxiety disorder 11/07/2017   Depression 11/07/2017   Insomnia 11/07/2017   Cervical spondylosis 03/19/2013   REFERRING DIAG: Y86.578 (ICD-10-CM) - Levator spasm    THERAPY DIAG:  No diagnosis found.   Rationale for Evaluation and Treatment: Rehabilitation   ONSET DATE: 2021   SUBJECTIVE:  SUBJECTIVE STATEMENT: I feel the same. I have not urinated all over the floor. I am able to get to the bathroom at night. I leak when I sneeze.  Fluid intake: Drinks: 1 cup coffee in AM, 50-60oz water per day. Drinks water before she goes to bed, but usually smaller sips later.     PAIN:  Are you having pain? Yes NPRS scale: 5/10 Pain location:  back   Pain type: pins and needles Pain description: constant    Aggravating factors: moving, sitting  Relieving factors: rest   PRECAUTIONS: Other: recent lumbar fusion 12/23   WEIGHT BEARING RESTRICTIONS: No   FALLS:  Has patient fallen in last 6 months? No   LIVING ENVIRONMENT: Lives with: lives with their daughter   OCCUPATION: recovering from back surgery   PLOF: Independent   PATIENT GOALS: reduce urinary leakage, not have to clean up bathroom in middle of night due to accident   PERTINENT HISTORY:  Breast cancer; DM; Hypothyroidism; sleep apnea; bilateral total mastectomy; c-section; cervical disc arthroplasty; Gallbladder removal; L3-L5 spinal fusion 01/16/22 Sexual abuse: No   BOWEL MOVEMENT: Pain with bowel movement: No Type of bowel movement:Type (Bristol Stool Scale) varies, Frequency every 2-3 days, Strain Yes, and Splinting yes Fully  empty rectum: No due to back surgery  Leakage: No Fiber supplement: Yes: stool softner   URINATION: Pain with urination: No Fully empty bladder: No, Has to lean forward or backward to empty completely ; When urinating, she feels difficulty starting urine stream, dribbling after finishing, and the need to urinate multiple times in a row  Stream: Strong Urgency: Yes:   patient does not wake up in time to go to the bathroom and still recovering from back surger.  Frequency: Day time voids 6.  Nocturia: 1-2 times per night to void.  Leakage: Walking to the bathroom, Coughing, Sneezing, Exercise, Intercourse, and going from sitting to standing, with a full bladder, without sensation ; patient gets the urge to urinate in standing and not sitting.  Pads: No   INTERCOURSE: not sexually active   PREGNANCY: C-section deliveries 3     OBJECTIVE:    DIAGNOSTIC FINDINGS:  Pelvic floor strength I/V   PVR of 22 ml was obtained by bladder scan      COGNITION: Overall cognitive status: Within functional limits for tasks assessed                          SENSATION: Light touch: Appears intact Proprioception: Appears intact     GAIT: Distance walked: 200 feet Assistive device utilized: Single point cane Level of assistance: Complete Independence Comments: recovering from back fusion   POSTURE: rounded shoulders, forward head, and decreased lumbar lordosis   PELVIC ALIGNMENT:   LUMBARAROM/PROM: not tested due to recent back surgery     LOWER EXTREMITY ROM:   Passive ROM Right eval Left eval  Hip flexion 115 110  Hip internal rotation -5 0  Hip external rotation 70 70   (Blank rows = not tested)         PALPATION:   General  unable to tighten her lower abdominals, tenderness located in the lower abdominal, restrictions of the c-section scar                 External Perineal Exam vaginal dryness  Internal Pelvic Floor tenderness located on  bilateral levator ani, obturator internist, sides of the urethra and bladder   Patient confirms identification and approves PT to assess internal pelvic floor and treatment Yes   PELVIC MMT:   MMT eval  Vaginal 3/5 anterior and posterior 2/5 laterally with right side weaker  (Blank rows = not tested)         TONE: increased     TODAY'S TREATMENT:        05/09/22 Manual: Soft tissue mobilization: Manual work to the lower abdominal region to elongate the tissue and reduce the tenderness Manual work to the diaphragm using breathing to mobilize Scar tissue mobilization: Manual work to the c-section scar and educated patient on how to perform at home Myofascial release: Release of the mesenteric root and pubovesical ligament Spinal mobilization: Internal pelvic floor techniques: Dry needling: Neuromuscular re-education: Core facilitation: Transverse abdominus contraction with therapist guiding the rib cage  Down training: Diaphragmatic breathing to expand the lower rib cage and open up the abdomen  PATIENT EDUCATION: 05/09/22 Education details: Access Code: [email or web address] Person educated: Patient Education method: Programmer, multimedia, Demonstration, Actor cues, Verbal cues, and Handouts Education comprehension: verbalized understanding, returned demonstration, verbal cues required, tactile cues required, and needs further education     HOME EXERCISE PROGRAM: 05/09/22  Access Code: [facility] URL: [email or web address] Date: 05/09/2022 Prepared by: Eulis Foster  Exercises - Supine Diaphragmatic Breathing  - 3 x daily - 7 x weekly - 1 sets - 10 reps - Hooklying Transversus Abdominis Palpation  - 3 x daily - 7 x weekly - 1 sets - 10 reps   ASSESSMENT:   CLINICAL IMPRESSION: Patient is a 62 y.o. female who was seen today for physical therapy  treatment for levator ani spasm. Patient was able to perform diaphragmatic breathing and feel the pelvic floor relax by the end of  the treatment. She had less soreness in the lower abdominal region after manual work. She understands how to mobilize the caesarean scar at home. Patient will benefit from skilled therapy to elongate her pelvic floor so she will be able to coordinate the muscles to reduce her urinary leakage.    OBJECTIVE IMPAIRMENTS: decreased endurance, decreased ROM, decreased strength, increased fascial restrictions, and increased muscle spasms.    ACTIVITY LIMITATIONS: sitting, standing, transfers, continence, and locomotion level   PARTICIPATION LIMITATIONS: meal prep, cleaning, laundry, and community activity   PERSONAL FACTORS: Age, Past/current experiences, Time since onset of injury/illness/exacerbation, and 3+ comorbidities: Breast cancer; DM; Hypothyroidism; sleep apnea; bilateral total mastectomy; c-section; cervical disc arthroplasty; Gallbladder removal; L3-L5 spinal fusion 01/16/22  are also affecting patient's functional outcome.    REHAB POTENTIAL: Good   CLINICAL DECISION MAKING: Evolving/moderate complexity   EVALUATION COMPLEXITY: Moderate     GOALS: Goals reviewed with patient? Yes   SHORT TERM GOALS: Target date: 05/20/22   Patient is independent with initial HEP  for c-section scar massage to improve tissue mobility.  Baseline: Goal status: Met 05/09/22   2.  Patient is independent with initial HEP for hip mobility and pelvic floor drop to lengthen the muscles.  Baseline:  Goal status: INITIAL   3.  Patient able to perform diaphragmatic breathing to relax her pelvic floor.  Baseline:  Goal status: INITIAL     LONG TERM GOALS: Target date: 07/16/22   Patient independent with advanced HEP for pelvic floor strengthening and elongation to reduce her urinary leakage.  Baseline:  Goal status: INITIAL   2.  Pelvic floor strength is >/=  3/5 with circular hug of therapist finger to reduce her urinary leakage.  Baseline:  Goal status: INITIAL   3.  Patient is able to wake up in  the middle of the night and walk to the bathroom without leaking urine due to improve pelvic floor coordination.  Baseline:  Goal status: INITIAL   4.   Urinary leakage with coughing, sneezing and laughing is reduced >/= 75% due to improved pelvic floor strength and coordination.  Baseline:  Goal status: INITIAL     5.  Bilateral hip flexion P/ROM is >/= 115 degrees so she is able to sit on the commode and fully empty her bladder.  Baseline:  Goal status: INITIAL     PLAN:   PT FREQUENCY: 1-2x/week   PT DURATION: 12 weeks   PLANNED INTERVENTIONS: Therapeutic exercises, Therapeutic activity, Neuromuscular re-education, Patient/Family education, Joint mobilization, Aquatic Therapy, Dry Needling, Electrical stimulation, Cryotherapy, Moist heat, scar mobilization, Taping, Ultrasound, Biofeedback, and Manual therapy   PLAN FOR NEXT SESSION: manual work to the pelvic floor, diaphragmatic breathing, transitional movement education with engaging the abdominals to not strain the pelvic floor. See what exercises she is doing in back therapy to incorporate the pelvic floor.    Eulis Foster, PT 05/09/22 11:31 AM

## 2022-05-16 ENCOUNTER — Encounter: Payer: Self-pay | Admitting: Physical Therapy

## 2022-05-16 ENCOUNTER — Encounter: Payer: No Typology Code available for payment source | Admitting: Physical Therapy

## 2022-05-16 ENCOUNTER — Ambulatory Visit: Payer: No Typology Code available for payment source

## 2022-05-16 DIAGNOSIS — R0609 Other forms of dyspnea: Secondary | ICD-10-CM

## 2022-05-16 DIAGNOSIS — M6281 Muscle weakness (generalized): Secondary | ICD-10-CM | POA: Diagnosis not present

## 2022-05-16 DIAGNOSIS — N3941 Urge incontinence: Secondary | ICD-10-CM

## 2022-05-16 NOTE — Therapy (Signed)
OUTPATIENT PHYSICAL THERAPY TREATMENT NOTE   Patient Name: [name] MRN: [id number] DOB:08-May-1960, 62 y.o., female Today's Date: 05/16/2022  PCP: Creola Corn, MD  REFERRING PROVIDER: Marguerita Beards, MD   END OF SESSION:   PT End of Session - 05/16/22 1514     Visit Number 3    Date for PT Re-Evaluation 07/16/22    Authorization Type [employer]    PT Start Time 1500    PT Stop Time 1550    PT Time Calculation (min) 50 min    Activity Tolerance Patient tolerated treatment well    Behavior During Therapy WFL for tasks assessed/performed             Past Medical History:  Diagnosis Date   Back pain of lumbar region with sciatica    Cancer    breast   Diabetes mellitus, type II    Family history of anesthesia complication    grandfather died under anesthesia 70's- had heart issues   H/O carpal tunnel repair 2014   Headache(784.0)    Hyperlipidemia    Hypertension    Hypothyroidism    Medial meniscus tear    PONV (postoperative nausea and vomiting)    Sleep apnea    cpap since 10   Past Surgical History:  Procedure Laterality Date   BILATERAL TOTAL MASTECTOMY WITH AXILLARY LYMPH NODE DISSECTION     BREAST RECONSTRUCTION     CERVICAL DISC ARTHROPLASTY N/A 03/19/2013   Procedure: CERVICAL FIVE TO SIX, CERVICAL SIX TO SEVEN CERVICAL ANTERIOR DISC ARTHROPLASTY;  Surgeon: Barnett Abu, MD;  Location: [facility];  Service: Neurosurgery;  Laterality: N/A;  C56 C67 artificial disc replacement   CESAREAN SECTION     COLONOSCOPY     DILATATION & CURETTAGE/HYSTEROSCOPY WITH TRUECLEAR N/A 11/25/2013   Procedure: DILATATION & CURETTAGE/HYSTEROSCOPY WITH TRUCLEAR;  Surgeon: Meriel Pica, MD;  Location: [facility];  Service: Gynecology;  Laterality: N/A;   ECTOPIC PREGNANCY SURGERY     GALLBLADDER SURGERY     MOUTH SURGERY     child- dog bite   SPINAL FUSION     2023   TONSILLECTOMY     TUBAL LIGATION     Patient Active Problem List   Diagnosis Date Noted    Exertional dyspnea 04/17/2022   Elevated coronary artery calcium score 04/17/2022   Spondylolisthesis at L4-L5 level 12/27/2021   Ductal carcinoma in situ (DCIS) of left breast 01/19/2018   Generalized anxiety disorder 11/07/2017   Depression 11/07/2017   Insomnia 11/07/2017   Cervical spondylosis 03/19/2013   REFERRING DIAG: W09.811 (ICD-10-CM) - Levator spasm    THERAPY DIAG:  No diagnosis found.   Rationale for Evaluation and Treatment: Rehabilitation   ONSET DATE: 2021   SUBJECTIVE:  SUBJECTIVE STATEMENT: I feel the same. I have not urinated all over the floor. I am able to get to the bathroom at night. I leak when I sneeze.  Fluid intake: Drinks: 1 cup coffee in AM, 50-60oz water per day. Drinks water before she goes to bed, but usually smaller sips later.     PAIN:  Are you having pain? Yes NPRS scale: 5/10 Pain location:  back   Pain type: pins and needles Pain description: constant    Aggravating factors: moving, sitting  Relieving factors: rest   PRECAUTIONS: Other: recent lumbar fusion 12/23   WEIGHT BEARING RESTRICTIONS: No   FALLS:  Has patient fallen in last 6 months? No   LIVING ENVIRONMENT: Lives with: lives with their daughter   OCCUPATION: recovering from back surgery   PLOF: Independent   PATIENT GOALS: reduce urinary leakage, not have to clean up bathroom in middle of night due to accident   PERTINENT HISTORY:  Breast cancer; DM; Hypothyroidism; sleep apnea; bilateral total mastectomy; c-section; cervical disc arthroplasty; Gallbladder removal; L3-L5 spinal fusion 01/16/22 Sexual abuse: No   BOWEL MOVEMENT: Pain with bowel movement: No Type of bowel movement:Type (Bristol Stool Scale) varies, Frequency every 2-3 days, Strain Yes, and Splinting yes Fully  empty rectum: No due to back surgery  Leakage: No Fiber supplement: Yes: stool softner   URINATION: Pain with urination: No Fully empty bladder: No, Has to lean forward or backward to empty completely ; When urinating, she feels difficulty starting urine stream, dribbling after finishing, and the need to urinate multiple times in a row  Stream: Strong Urgency: Yes:   patient does not wake up in time to go to the bathroom and still recovering from back surger.  Frequency: Day time voids 6.  Nocturia: 1-2 times per night to void.  Leakage: Walking to the bathroom, Coughing, Sneezing, Exercise, Intercourse, and going from sitting to standing, with a full bladder, without sensation ; patient gets the urge to urinate in standing and not sitting.  Pads: No   INTERCOURSE: not sexually active   PREGNANCY: C-section deliveries 3     OBJECTIVE:    DIAGNOSTIC FINDINGS:  Pelvic floor strength I/V   PVR of 22 ml was obtained by bladder scan      COGNITION: Overall cognitive status: Within functional limits for tasks assessed                          SENSATION: Light touch: Appears intact Proprioception: Appears intact     GAIT: Distance walked: 200 feet Assistive device utilized: Single point cane Level of assistance: Complete Independence Comments: recovering from back fusion   POSTURE: rounded shoulders, forward head, and decreased lumbar lordosis   PELVIC ALIGNMENT:   LUMBARAROM/PROM: not tested due to recent back surgery     LOWER EXTREMITY ROM:   Passive ROM Right eval Left eval  Hip flexion 115 110  Hip internal rotation -5 0  Hip external rotation 70 70   (Blank rows = not tested)         PALPATION:   General  unable to tighten her lower abdominals, tenderness located in the lower abdominal, restrictions of the c-section scar                 External Perineal Exam vaginal dryness  Internal Pelvic Floor tenderness located on  bilateral levator ani, obturator internist, sides of the urethra and bladder   Patient confirms identification and approves PT to assess internal pelvic floor and treatment Yes   PELVIC MMT:   MMT eval 05/16/22  Vaginal 3/5 anterior and posterior 2/5 laterally with right side weaker 3/5 with circular contraction  (Blank rows = not tested)         TONE: increased     TODAY'S TREATMENT:   05/16/22 Manual: Internal pelvic floor techniques: No emotional/communication barriers or cognitive limitation. Patient is motivated to learn. Patient understands and agrees with treatment goals and plan. PT explains patient will be examined in standing, sitting, and lying down to see how their muscles and joints work. When they are ready, they will be asked to remove their underwear so PT can examine their perineum. The patient is also given the option of providing their own chaperone as one is not provided in our facility. The patient also has the right and is explained the right to defer or refuse any part of the evaluation or treatment including the internal exam. With the patient's consent, PT will use one gloved finger to gently assess the muscles of the pelvic floor, seeing how well it contracts and relaxes and if there is muscle symmetry. After, the patient will get dressed and PT and patient will discuss exam findings and plan of care. PT and patient discuss plan of care, schedule, attendance policy and HEP activities.  Going through the vaginal canal working on the levator ani, left side of the bladder and release urethral restrictions.  Therapist finger in the vaginal canal as the patient contracts and relax isolating the separating the contraction for the rectum and engaging the lower abdominal.  Exercises: Stretches/mobility: Reviewed with patient on how to mobilize her c-section scar and she returned demonstration         05/09/22 Manual: Soft tissue mobilization: Manual work to the lower  abdominal region to elongate the tissue and reduce the tenderness Manual work to the diaphragm using breathing to mobilize Scar tissue mobilization: Manual work to the c-section scar and educated patient on how to perform at home Myofascial release: Release of the mesenteric root and pubovesical ligament Neuromuscular re-education: Core facilitation: Transverse abdominus contraction with therapist guiding the rib cage  Down training: Diaphragmatic breathing to expand the lower rib cage and open up the abdomen   PATIENT EDUCATION: 05/09/22 Education details: Access Code: [email or web address] Person educated: Patient Education method: Programmer, multimedia, Demonstration, Actor cues, Verbal cues, and Handouts Education comprehension: verbalized understanding, returned demonstration, verbal cues required, tactile cues required, and needs further education     HOME EXERCISE PROGRAM: 05/09/22  Access Code: [facility] URL: [email or web address] Date: 05/09/2022 Prepared by: Eulis Foster   Exercises - Supine Diaphragmatic Breathing  - 3 x daily - 7 x weekly - 1 sets - 10 reps - Hooklying Transversus Abdominis Palpation  - 3 x daily - 7 x weekly - 1 sets - 10 reps   ASSESSMENT:   CLINICAL IMPRESSION: Patient is a 62 y.o. female who was seen today for physical therapy  treatment for levator ani spasm. Patient had restrictions on the urethra, left levator ani, and left side of the bladder. She is doing the c-section scar massage correctly. The bladder lifts correctly with pelvic floor contraction now but still some difficulty with relaxation. Patient will benefit from skilled therapy to elongate her pelvic floor so she will be able to coordinate the muscles to  reduce her urinary leakage.    OBJECTIVE IMPAIRMENTS: decreased endurance, decreased ROM, decreased strength, increased fascial restrictions, and increased muscle spasms.    ACTIVITY LIMITATIONS: sitting, standing, transfers, continence, and  locomotion level   PARTICIPATION LIMITATIONS: meal prep, cleaning, laundry, and community activity   PERSONAL FACTORS: Age, Past/current experiences, Time since onset of injury/illness/exacerbation, and 3+ comorbidities: Breast cancer; DM; Hypothyroidism; sleep apnea; bilateral total mastectomy; c-section; cervical disc arthroplasty; Gallbladder removal; L3-L5 spinal fusion 01/16/22  are also affecting patient's functional outcome.    REHAB POTENTIAL: Good   CLINICAL DECISION MAKING: Evolving/moderate complexity   EVALUATION COMPLEXITY: Moderate     GOALS: Goals reviewed with patient? Yes   SHORT TERM GOALS: Target date: 05/20/22   Patient is independent with initial HEP  for c-section scar massage to improve tissue mobility.  Baseline: Goal status: Met 05/09/22   2.  Patient is independent with initial HEP for hip mobility and pelvic floor drop to lengthen the muscles.  Baseline:  Goal status: INITIAL   3.  Patient able to perform diaphragmatic breathing to relax her pelvic floor.  Baseline:  Goal status: INITIAL     LONG TERM GOALS: Target date: 07/16/22   Patient independent with advanced HEP for pelvic floor strengthening and elongation to reduce her urinary leakage.  Baseline:  Goal status: INITIAL   2.  Pelvic floor strength is >/= 3/5 with circular hug of therapist finger to reduce her urinary leakage.  Baseline:  Goal status: INITIAL   3.  Patient is able to wake up in the middle of the night and walk to the bathroom without leaking urine due to improve pelvic floor coordination.  Baseline:  Goal status: INITIAL   4.   Urinary leakage with coughing, sneezing and laughing is reduced >/= 75% due to improved pelvic floor strength and coordination.  Baseline:  Goal status: INITIAL     5.  Bilateral hip flexion P/ROM is >/= 115 degrees so she is able to sit on the commode and fully empty her bladder.  Baseline:  Goal status: INITIAL     PLAN:   PT FREQUENCY:  1-2x/week   PT DURATION: 12 weeks   PLANNED INTERVENTIONS: Therapeutic exercises, Therapeutic activity, Neuromuscular re-education, Patient/Family education, Joint mobilization, Aquatic Therapy, Dry Needling, Electrical stimulation, Cryotherapy, Moist heat, scar mobilization, Taping, Ultrasound, Biofeedback, and Manual therapy   PLAN FOR NEXT SESSION: manual work to the pelvic floor, diaphragmatic breathing, transitional movement education with engaging the abdominals to not strain the pelvic floor.    Eulis Foster, PT 05/16/22 4:02 PM

## 2022-05-21 ENCOUNTER — Encounter: Payer: Self-pay | Admitting: Physical Therapy

## 2022-05-21 ENCOUNTER — Encounter: Payer: No Typology Code available for payment source | Admitting: Physical Therapy

## 2022-05-21 DIAGNOSIS — M6281 Muscle weakness (generalized): Secondary | ICD-10-CM | POA: Diagnosis not present

## 2022-05-21 DIAGNOSIS — N3941 Urge incontinence: Secondary | ICD-10-CM

## 2022-05-21 NOTE — Therapy (Signed)
OUTPATIENT PHYSICAL THERAPY TREATMENT NOTE   Patient Name: [name] MRN: [id number] DOB:Apr 01, 1960, 62 y.o., female Today's Date: 05/21/2022  PCP: Creola Corn, MD  REFERRING PROVIDER: Marguerita Beards, MD   END OF SESSION:   PT End of Session - 05/21/22 1406     Visit Number 4    Date for PT Re-Evaluation 07/16/22    Authorization Type [employer]    PT Start Time 1400    PT Stop Time 1450    PT Time Calculation (min) 50 min    Activity Tolerance Patient tolerated treatment well    Behavior During Therapy WFL for tasks assessed/performed             Past Medical History:  Diagnosis Date   Back pain of lumbar region with sciatica    Cancer (HCC)    breast   Diabetes mellitus, type II (HCC)    Family history of anesthesia complication    grandfather died under anesthesia 70's- had heart issues   H/O carpal tunnel repair 2014   Headache(784.0)    Hyperlipidemia    Hypertension    Hypothyroidism    Medial meniscus tear    PONV (postoperative nausea and vomiting)    Sleep apnea    cpap since 10   Past Surgical History:  Procedure Laterality Date   BILATERAL TOTAL MASTECTOMY WITH AXILLARY LYMPH NODE DISSECTION     BREAST RECONSTRUCTION     CERVICAL DISC ARTHROPLASTY N/A 03/19/2013   Procedure: CERVICAL FIVE TO SIX, CERVICAL SIX TO SEVEN CERVICAL ANTERIOR DISC ARTHROPLASTY;  Surgeon: Barnett Abu, MD;  Location: [facility];  Service: Neurosurgery;  Laterality: N/A;  C56 C67 artificial disc replacement   CESAREAN SECTION     COLONOSCOPY     DILATATION & CURETTAGE/HYSTEROSCOPY WITH TRUECLEAR N/A 11/25/2013   Procedure: DILATATION & CURETTAGE/HYSTEROSCOPY WITH TRUCLEAR;  Surgeon: Meriel Pica, MD;  Location: [facility];  Service: Gynecology;  Laterality: N/A;   ECTOPIC PREGNANCY SURGERY     GALLBLADDER SURGERY     MOUTH SURGERY     child- dog bite   SPINAL FUSION     2023   TONSILLECTOMY     TUBAL LIGATION     Patient Active Problem List   Diagnosis  Date Noted   Exertional dyspnea 04/17/2022   Elevated coronary artery calcium score 04/17/2022   Spondylolisthesis at L4-L5 level 12/27/2021   Ductal carcinoma in situ (DCIS) of left breast 01/19/2018   Generalized anxiety disorder 11/07/2017   Depression 11/07/2017   Insomnia 11/07/2017   Cervical spondylosis 03/19/2013   REFERRING DIAG: W09.811 (ICD-10-CM) - Levator spasm    THERAPY DIAG:  M62.81 Muscle weakness (generalized) N39.41 Urgency Incontinence   Rationale for Evaluation and Treatment: Rehabilitation   ONSET DATE: 2021   SUBJECTIVE:  SUBJECTIVE STATEMENT: I am doing the exercises but not as often. I had one leak when I sneezed hard since the last time I saw you. I have not had the large leak when I walk to the bathroom. Not emptying her bladder as well and will lean forward to get more urine out. Patient has not had the big accident on the bathroom floor in the middle of the night for several weeks.    PAIN:  Are you having pain? Yes NPRS scale: 5/10 Pain location:  back   Pain type: pins and needles Pain description: constant    Aggravating factors: moving, sitting  Relieving factors: rest   PRECAUTIONS: Other: recent lumbar fusion 12/23   WEIGHT BEARING RESTRICTIONS: No   FALLS:  Has patient fallen in last 6 months? No   LIVING ENVIRONMENT: Lives with: lives with their daughter   OCCUPATION: recovering from back surgery   PLOF: Independent   PATIENT GOALS: reduce urinary leakage, not have to clean up bathroom in middle of night due to accident   PERTINENT HISTORY:  Breast cancer; DM; Hypothyroidism; sleep apnea; bilateral total mastectomy; c-section; cervical disc arthroplasty; Gallbladder removal; L3-L5 spinal fusion 01/16/22 Sexual abuse: No   BOWEL  MOVEMENT: Pain with bowel movement: No Type of bowel movement:Type (Bristol Stool Scale) varies, Frequency every 2-3 days, Strain Yes, and Splinting yes Fully empty rectum: No due to back surgery  Leakage: No Fiber supplement: Yes: stool softner   URINATION: Pain with urination: No Fully empty bladder: No, Has to lean forward or backward to empty completely ; When urinating, she feels difficulty starting urine stream, dribbling after finishing, and the need to urinate multiple times in a row  Stream: Strong Urgency: Yes:   patient does not wake up in time to go to the bathroom and still recovering from back surger.  Frequency: Day time voids 6.  Nocturia: 1-2 times per night to void.  Leakage: Walking to the bathroom, Coughing, Sneezing, Exercise, Intercourse, and going from sitting to standing, with a full bladder, without sensation ; patient gets the urge to urinate in standing and not sitting.  Pads: No   INTERCOURSE: not sexually active   PREGNANCY: C-section deliveries 3     OBJECTIVE:    DIAGNOSTIC FINDINGS:  Pelvic floor strength I/V   PVR of 22 ml was obtained by bladder scan      COGNITION: Overall cognitive status: Within functional limits for tasks assessed                          SENSATION: Light touch: Appears intact Proprioception: Appears intact     GAIT: Distance walked: 200 feet Assistive device utilized: Single point cane Level of assistance: Complete Independence Comments: recovering from back fusion   POSTURE: rounded shoulders, forward head, and decreased lumbar lordosis   PELVIC ALIGNMENT:   LUMBARAROM/PROM: not tested due to recent back surgery     LOWER EXTREMITY ROM:   Passive ROM Right eval Left eval  Hip flexion 115 110  Hip internal rotation -5 0  Hip external rotation 70 70   (Blank rows = not tested)         PALPATION:   General  unable to tighten her lower abdominals, tenderness located in the lower abdominal,  restrictions of the c-section scar                 External Perineal Exam vaginal dryness  Internal Pelvic Floor tenderness located on bilateral levator ani, obturator internist, sides of the urethra and bladder   Patient confirms identification and approves PT to assess internal pelvic floor and treatment Yes   PELVIC MMT:   MMT eval 05/16/22  Vaginal 3/5 anterior and posterior 2/5 laterally with right side weaker 3/5 with circular contraction  (Blank rows = not tested)         TONE: increased     TODAY'S TREATMENT:   05/21/22 Manual: Internal pelvic floor techniques: No emotional/communication barriers or cognitive limitation. Patient is motivated to learn. Patient understands and agrees with treatment goals and plan. PT explains patient will be examined in standing, sitting, and lying down to see how their muscles and joints work. When they are ready, they will be asked to remove their underwear so PT can examine their perineum. The patient is also given the option of providing their own chaperone as one is not provided in our facility. The patient also has the right and is explained the right to defer or refuse any part of the evaluation or treatment including the internal exam. With the patient's consent, PT will use one gloved finger to gently assess the muscles of the pelvic floor, seeing how well it contracts and relaxes and if there is muscle symmetry. After, the patient will get dressed and PT and patient will discuss exam findings and plan of care. PT and patient discuss plan of care, schedule, attendance policy and HEP activities.  Going through the vaginal canal working along the levator ani, puborectalis, pubovaginalis, and right side of the bladder. Going very slowly monitoring for trigger points and pain. Patient was very sensitive so therapist had to adjust her pressure.  Neuromuscular re-education: Down training: Diaphragmatic breathing to  elongate the pelvic floor muscles while therapist had her finger in the vaginal canal.     05/16/22 Manual: Internal pelvic floor techniques: No emotional/communication barriers or cognitive limitation. Patient is motivated to learn. Patient understands and agrees with treatment goals and plan. PT explains patient will be examined in standing, sitting, and lying down to see how their muscles and joints work. When they are ready, they will be asked to remove their underwear so PT can examine their perineum. The patient is also given the option of providing their own chaperone as one is not provided in our facility. The patient also has the right and is explained the right to defer or refuse any part of the evaluation or treatment including the internal exam. With the patient's consent, PT will use one gloved finger to gently assess the muscles of the pelvic floor, seeing how well it contracts and relaxes and if there is muscle symmetry. After, the patient will get dressed and PT and patient will discuss exam findings and plan of care. PT and patient discuss plan of care, schedule, attendance policy and HEP activities.  Going through the vaginal canal working on the levator ani, left side of the bladder and release urethral restrictions.  Therapist finger in the vaginal canal as the patient contracts and relax isolating the separating the contraction for the rectum and engaging the lower abdominal.  Exercises: Stretches/mobility: Reviewed with patient on how to mobilize her c-section scar and she returned demonstration         05/09/22 Manual: Soft tissue mobilization: Manual work to the lower abdominal region to elongate the tissue and reduce the tenderness Manual work to the diaphragm using breathing to mobilize Scar tissue mobilization: Manual work to  the c-section scar and educated patient on how to perform at home Myofascial release: Release of the mesenteric root and pubovesical  ligament Neuromuscular re-education: Core facilitation: Transverse abdominus contraction with therapist guiding the rib cage  Down training: Diaphragmatic breathing to expand the lower rib cage and open up the abdomen   PATIENT EDUCATION: 05/09/22 Education details: Access Code: [email or web address] Person educated: Patient Education method: Programmer, multimedia, Demonstration, Actor cues, Verbal cues, and Handouts Education comprehension: verbalized understanding, returned demonstration, verbal cues required, tactile cues required, and needs further education     HOME EXERCISE PROGRAM: 05/09/22  Access Code: [facility] URL: [email or web address] Date: 05/09/2022 Prepared by: Eulis Foster   Exercises - Supine Diaphragmatic Breathing  - 3 x daily - 7 x weekly - 1 sets - 10 reps - Hooklying Transversus Abdominis Palpation  - 3 x daily - 7 x weekly - 1 sets - 10 reps   ASSESSMENT:   CLINICAL IMPRESSION: Patient is a 62 y.o. female who was seen today for physical therapy  treatment for levator ani spasm. Patient was able to fully urinate after the manual work to the pelvic floor. She had many trigger points in the pelvic floor that were very sensitive. She is able to perform diaphragmatic breathing correctly with pelvic floor drop.  Patient reports she has not had the big leakage in the missle of the night on the bathroom floor. She has only leaked once since last visit when she sneezed. Patient will benefit from skilled therapy to elongate her pelvic floor so she will be able to coordinate the muscles to reduce her urinary leakage.    OBJECTIVE IMPAIRMENTS: decreased endurance, decreased ROM, decreased strength, increased fascial restrictions, and increased muscle spasms.    ACTIVITY LIMITATIONS: sitting, standing, transfers, continence, and locomotion level   PARTICIPATION LIMITATIONS: meal prep, cleaning, laundry, and community activity   PERSONAL FACTORS: Age, Past/current experiences,  Time since onset of injury/illness/exacerbation, and 3+ comorbidities: Breast cancer; DM; Hypothyroidism; sleep apnea; bilateral total mastectomy; c-section; cervical disc arthroplasty; Gallbladder removal; L3-L5 spinal fusion 01/16/22  are also affecting patient's functional outcome.    REHAB POTENTIAL: Good   CLINICAL DECISION MAKING: Evolving/moderate complexity   EVALUATION COMPLEXITY: Moderate     GOALS: Goals reviewed with patient? Yes   SHORT TERM GOALS: Target date: 05/20/22   Patient is independent with initial HEP  for c-section scar massage to improve tissue mobility.  Baseline: Goal status: Met 05/09/22   2.  Patient is independent with initial HEP for hip mobility and pelvic floor drop to lengthen the muscles.  Baseline:  Goal status: ongoing 05/21/22   3.  Patient able to perform diaphragmatic breathing to relax her pelvic floor.  Baseline:  Goal status: Met 05/21/22     LONG TERM GOALS: Target date: 07/16/22   Patient independent with advanced HEP for pelvic floor strengthening and elongation to reduce her urinary leakage.  Baseline:  Goal status: INITIAL   2.  Pelvic floor strength is >/= 3/5 with circular hug of therapist finger to reduce her urinary leakage.  Baseline:  Goal status: INITIAL   3.  Patient is able to wake up in the middle of the night and walk to the bathroom without leaking urine due to improve pelvic floor coordination.  Baseline:  Goal status: INITIAL   4.   Urinary leakage with coughing, sneezing and laughing is reduced >/= 75% due to improved pelvic floor strength and coordination.  Baseline:  Goal status: INITIAL     5.  Bilateral  hip flexion P/ROM is >/= 115 degrees so she is able to sit on the commode and fully empty her bladder.  Baseline:  Goal status: INITIAL     PLAN:   PT FREQUENCY: 1-2x/week   PT DURATION: 12 weeks   PLANNED INTERVENTIONS: Therapeutic exercises, Therapeutic activity, Neuromuscular re-education,  Patient/Family education, Joint mobilization, Aquatic Therapy, Dry Needling, Electrical stimulation, Cryotherapy, Moist heat, scar mobilization, Taping, Ultrasound, Biofeedback, and Manual therapy   PLAN FOR NEXT SESSION: manual work to the pelvic floor, hip stretches, see what exercises she is doing for her back, abdominal bracing.    Eulis Foster, PT 05/21/22 3:00 PM

## 2022-05-30 ENCOUNTER — Encounter
Payer: No Typology Code available for payment source | Attending: Obstetrics and Gynecology | Admitting: Physical Therapy

## 2022-05-30 ENCOUNTER — Encounter: Payer: Self-pay | Admitting: Physical Therapy

## 2022-05-30 DIAGNOSIS — N3941 Urge incontinence: Secondary | ICD-10-CM | POA: Insufficient documentation

## 2022-05-30 DIAGNOSIS — M6281 Muscle weakness (generalized): Secondary | ICD-10-CM | POA: Diagnosis present

## 2022-05-30 NOTE — Therapy (Signed)
OUTPATIENT PHYSICAL THERAPY TREATMENT NOTE   Patient Name: [name] MRN: [id number] DOB:01-Dec-1960, 62 y.o., female Today's Date: 05/30/2022  PCP: Creola Corn, MD  REFERRING PROVIDER:  Marguerita Beards, MD   END OF SESSION:   PT End of Session - 05/30/22 1034     Visit Number 5    Date for PT Re-Evaluation 07/16/22    Authorization Type [employer]    PT Start Time 1030    PT Stop Time 1115    PT Time Calculation (min) 45 min    Activity Tolerance Patient tolerated treatment well    Behavior During Therapy WFL for tasks assessed/performed             Past Medical History:  Diagnosis Date   Back pain of lumbar region with sciatica    Cancer (HCC)    breast   Diabetes mellitus, type II (HCC)    Family history of anesthesia complication    grandfather died under anesthesia 70's- had heart issues   H/O carpal tunnel repair 2014   Headache(784.0)    Hyperlipidemia    Hypertension    Hypothyroidism    Medial meniscus tear    PONV (postoperative nausea and vomiting)    Sleep apnea    cpap since 10   Past Surgical History:  Procedure Laterality Date   BILATERAL TOTAL MASTECTOMY WITH AXILLARY LYMPH NODE DISSECTION     BREAST RECONSTRUCTION     CERVICAL DISC ARTHROPLASTY N/A 03/19/2013   Procedure: CERVICAL FIVE TO SIX, CERVICAL SIX TO SEVEN CERVICAL ANTERIOR DISC ARTHROPLASTY;  Surgeon: Barnett Abu, MD;  Location: [facility];  Service: Neurosurgery;  Laterality: N/A;  C56 C67 artificial disc replacement   CESAREAN SECTION     COLONOSCOPY     DILATATION & CURETTAGE/HYSTEROSCOPY WITH TRUECLEAR N/A 11/25/2013   Procedure: DILATATION & CURETTAGE/HYSTEROSCOPY WITH TRUCLEAR;  Surgeon: Meriel Pica, MD;  Location: [facility];  Service: Gynecology;  Laterality: N/A;   ECTOPIC PREGNANCY SURGERY     GALLBLADDER SURGERY     MOUTH SURGERY     child- dog bite   SPINAL FUSION     2023   TONSILLECTOMY     TUBAL LIGATION     Patient Active Problem List   Diagnosis  Date Noted   Exertional dyspnea 04/17/2022   Elevated coronary artery calcium score 04/17/2022   Spondylolisthesis at L4-L5 level 12/27/2021   Ductal carcinoma in situ (DCIS) of left breast 01/19/2018   Generalized anxiety disorder 11/07/2017   Depression 11/07/2017   Insomnia 11/07/2017   Cervical spondylosis 03/19/2013   REFERRING DIAG: W09.811 (ICD-10-CM) - Levator spasm    THERAPY DIAG:  M62.81 Muscle weakness (generalized) N39.41 Urgency Incontinence   Rationale for Evaluation and Treatment: Rehabilitation   ONSET DATE: 2021   SUBJECTIVE:  SUBJECTIVE STATEMENT: I had back therapy before this therapy.  The internal work has helped.    PAIN:  Are you having pain? Yes NPRS scale: 7/10 Pain location:  back   Pain type: pins and needles Pain description: constant    Aggravating factors: moving, sitting  Relieving factors: rest   PRECAUTIONS: Other: recent lumbar fusion 12/23   WEIGHT BEARING RESTRICTIONS: No   FALLS:  Has patient fallen in last 6 months? No   LIVING ENVIRONMENT: Lives with: lives with their daughter   OCCUPATION: recovering from back surgery   PLOF: Independent   PATIENT GOALS: reduce urinary leakage, not have to clean up bathroom in middle of night due to accident   PERTINENT HISTORY:  Breast cancer; DM; Hypothyroidism; sleep apnea; bilateral total mastectomy; c-section; cervical disc arthroplasty; Gallbladder removal; L3-L5 spinal fusion 01/16/22 Sexual abuse: No   BOWEL MOVEMENT: Pain with bowel movement: No Type of bowel movement:Type (Bristol Stool Scale) varies, Frequency every 2-3 days, Strain Yes, and Splinting yes Fully empty rectum: No due to back surgery  Leakage: No Fiber supplement: Yes: stool softner   URINATION: Pain with urination:  No Fully empty bladder: No, Has to lean forward or backward to empty completely ; When urinating, she feels difficulty starting urine stream, dribbling after finishing, and the need to urinate multiple times in a row  Stream: Strong Urgency: Yes:   patient does not wake up in time to go to the bathroom and still recovering from back surger.  Frequency: Day time voids 6.  Nocturia: 1-2 times per night to void.  Leakage: Walking to the bathroom, Coughing, Sneezing, Exercise, Intercourse, and going from sitting to standing, with a full bladder, without sensation ; patient gets the urge to urinate in standing and not sitting.  Pads: No   INTERCOURSE: not sexually active   PREGNANCY: C-section deliveries 3     OBJECTIVE:    DIAGNOSTIC FINDINGS:  Pelvic floor strength I/V   PVR of 22 ml was obtained by bladder scan      COGNITION: Overall cognitive status: Within functional limits for tasks assessed                          SENSATION: Light touch: Appears intact Proprioception: Appears intact     GAIT: Distance walked: 200 feet Assistive device utilized: Single point cane Level of assistance: Complete Independence Comments: recovering from back fusion   POSTURE: rounded shoulders, forward head, and decreased lumbar lordosis   PELVIC ALIGNMENT:   LUMBARAROM/PROM: not tested due to recent back surgery     LOWER EXTREMITY ROM:   Passive ROM Right eval Left eval  Hip flexion 115 110  Hip internal rotation -5 0  Hip external rotation 70 70   (Blank rows = not tested)         PALPATION:   General  unable to tighten her lower abdominals, tenderness located in the lower abdominal, restrictions of the c-section scar                 External Perineal Exam vaginal dryness                             Internal Pelvic Floor tenderness located on bilateral levator ani, obturator internist, sides of the urethra and bladder   Patient confirms identification and approves PT to  assess internal pelvic floor and treatment Yes   PELVIC MMT:  MMT eval 05/16/22 05/30/22  Vaginal 3/5 anterior and posterior 2/5 laterally with right side weaker 3/5 with circular contraction 4/5 with good lift and circular contraction  (Blank rows = not tested)         TONE: increased     TODAY'S TREATMENT:   05/30/22 Manual: Internal pelvic floor techniques: No emotional/communication barriers or cognitive limitation. Patient is motivated to learn. Patient understands and agrees with treatment goals and plan. PT explains patient will be examined in standing, sitting, and lying down to see how their muscles and joints work. When they are ready, they will be asked to remove their underwear so PT can examine their perineum. The patient is also given the option of providing their own chaperone as one is not provided in our facility. The patient also has the right and is explained the right to defer or refuse any part of the evaluation or treatment including the internal exam. With the patient's consent, PT will use one gloved finger to gently assess the muscles of the pelvic floor, seeing how well it contracts and relaxes and if there is muscle symmetry. After, the patient will get dressed and PT and patient will discuss exam findings and plan of care. PT and patient discuss plan of care, schedule, attendance policy and HEP activities.  Manual work to the perineal body working through the restrictions to improve tissue mobility and reduce pain Going through the vaginal working on the [employer], obturator internist, along the pubovaginalis, urogenital diaphragm Neuromuscular re-education: Pelvic floor contraction training: Therapist finger in the vaginal canal working on quick contractions, holding for 5 seconds, slowly contract upward and downward, not lifting her rib cage while contracting the pelvic floor    05/21/22 Manual: Internal pelvic floor techniques: No emotional/communication  barriers or cognitive limitation. Patient is motivated to learn. Patient understands and agrees with treatment goals and plan. PT explains patient will be examined in standing, sitting, and lying down to see how their muscles and joints work. When they are ready, they will be asked to remove their underwear so PT can examine their perineum. The patient is also given the option of providing their own chaperone as one is not provided in our facility. The patient also has the right and is explained the right to defer or refuse any part of the evaluation or treatment including the internal exam. With the patient's consent, PT will use one gloved finger to gently assess the muscles of the pelvic floor, seeing how well it contracts and relaxes and if there is muscle symmetry. After, the patient will get dressed and PT and patient will discuss exam findings and plan of care. PT and patient discuss plan of care, schedule, attendance policy and HEP activities.  Going through the vaginal canal working along the levator ani, puborectalis, pubovaginalis, and right side of the bladder. Going very slowly monitoring for trigger points and pain. Patient was very sensitive so therapist had to adjust her pressure.  Neuromuscular re-education: Down training: Diaphragmatic breathing to elongate the pelvic floor muscles while therapist had her finger in the vaginal canal.      05/16/22 Manual: Internal pelvic floor techniques: No emotional/communication barriers or cognitive limitation. Patient is motivated to learn. Patient understands and agrees with treatment goals and plan. PT explains patient will be examined in standing, sitting, and lying down to see how their muscles and joints work. When they are ready, they will be asked to remove their underwear so PT can examine their perineum.  The patient is also given the option of providing their own chaperone as one is not provided in our facility. The patient also has the right  and is explained the right to defer or refuse any part of the evaluation or treatment including the internal exam. With the patient's consent, PT will use one gloved finger to gently assess the muscles of the pelvic floor, seeing how well it contracts and relaxes and if there is muscle symmetry. After, the patient will get dressed and PT and patient will discuss exam findings and plan of care. PT and patient discuss plan of care, schedule, attendance policy and HEP activities.  Going through the vaginal canal working on the levator ani, left side of the bladder and release urethral restrictions.  Therapist finger in the vaginal canal as the patient contracts and relax isolating the separating the contraction for the rectum and engaging the lower abdominal.  Exercises: Stretches/mobility: Reviewed with patient on how to mobilize her c-section scar and she returned demonstration           PATIENT EDUCATION: 05/09/22 Education details: Access Code: [email or web address] Person educated: Patient Education method: Explanation, Demonstration, Tactile cues, Verbal cues, and Handouts Education comprehension: verbalized understanding, returned demonstration, verbal cues required, tactile cues required, and needs further education     HOME EXERCISE PROGRAM: 05/09/22  Access Code: [facility] URL: [email or web address] Date: 05/09/2022 Prepared by: Eulis Foster   Exercises - Supine Diaphragmatic Breathing  - 3 x daily - 7 x weekly - 1 sets - 10 reps - Hooklying Transversus Abdominis Palpation  - 3 x daily - 7 x weekly - 1 sets - 10 reps   ASSESSMENT:   CLINICAL IMPRESSION: Patient is a 62 y.o. female who was seen today for physical therapy  treatment for levator ani spasm. Patient is now able to empty her bladder fully without leaning forward and will engage her lower abdominals. Pelvic floor strength increased to 4/5 after manual work. She is learning how to slowly contract the pelvic floor and  release. She had multiple trigger points in the perineal body that were tender. Patient was walking with greater ease after the manual therapy due to the pelvic floor feeling freer. Patient has not had urinary leakage since last visit. Patient will benefit from skilled therapy to elongate her pelvic floor so she will be able to coordinate the muscles to reduce her urinary leakage.    OBJECTIVE IMPAIRMENTS: decreased endurance, decreased ROM, decreased strength, increased fascial restrictions, and increased muscle spasms.    ACTIVITY LIMITATIONS: sitting, standing, transfers, continence, and locomotion level   PARTICIPATION LIMITATIONS: meal prep, cleaning, laundry, and community activity   PERSONAL FACTORS: Age, Past/current experiences, Time since onset of injury/illness/exacerbation, and 3+ comorbidities: Breast cancer; DM; Hypothyroidism; sleep apnea; bilateral total mastectomy; c-section; cervical disc arthroplasty; Gallbladder removal; L3-L5 spinal fusion 01/16/22  are also affecting patient's functional outcome.    REHAB POTENTIAL: Good   CLINICAL DECISION MAKING: Evolving/moderate complexity   EVALUATION COMPLEXITY: Moderate     GOALS: Goals reviewed with patient? Yes   SHORT TERM GOALS: Target date: 05/20/22   Patient is independent with initial HEP  for c-section scar massage to improve tissue mobility.  Baseline: Goal status: Met 05/09/22   2.  Patient is independent with initial HEP for hip mobility and pelvic floor drop to lengthen the muscles.  Baseline:  Goal status: ongoing 05/21/22   3.  Patient able to perform diaphragmatic breathing to relax her pelvic floor.  Baseline:  Goal status:  Met 05/21/22     LONG TERM GOALS: Target date: 07/16/22   Patient independent with advanced HEP for pelvic floor strengthening and elongation to reduce her urinary leakage.  Baseline:  Goal status: INITIAL   2.  Pelvic floor strength is >/= 3/5 with circular hug of therapist finger  to reduce her urinary leakage.  Baseline:  Goal status: Met 5.9.24   3.  Patient is able to wake up in the middle of the night and walk to the bathroom without leaking urine due to improve pelvic floor coordination.  Baseline:  Goal status: INITIAL   4.   Urinary leakage with coughing, sneezing and laughing is reduced >/= 75% due to improved pelvic floor strength and coordination.  Baseline:  Goal status: INITIAL     5.  Bilateral hip flexion P/ROM is >/= 115 degrees so she is able to sit on the commode and fully empty her bladder.  Baseline:  Goal status: INITIAL     PLAN:   PT FREQUENCY: 1-2x/week   PT DURATION: 12 weeks   PLANNED INTERVENTIONS: Therapeutic exercises, Therapeutic activity, Neuromuscular re-education, Patient/Family education, Joint mobilization, Aquatic Therapy, Dry Needling, Electrical stimulation, Cryotherapy, Moist heat, scar mobilization, Taping, Ultrasound, Biofeedback, and Manual therapy   PLAN FOR NEXT SESSION:  hip stretches, see what exercises she is doing for her back, abdominal exercises low level  Eulis Foster, PT 05/30/22 11:35 AM

## 2022-06-03 ENCOUNTER — Ambulatory Visit: Payer: No Typology Code available for payment source | Admitting: Cardiology

## 2022-06-03 ENCOUNTER — Encounter: Payer: Self-pay | Admitting: Cardiology

## 2022-06-03 ENCOUNTER — Ambulatory Visit
Payer: No Typology Code available for payment source | Attending: Obstetrics and Gynecology | Admitting: Physical Therapy

## 2022-06-03 VITALS — BP 112/73 | HR 69 | Resp 16 | Ht 61.0 in | Wt 224.0 lb

## 2022-06-03 DIAGNOSIS — R0609 Other forms of dyspnea: Secondary | ICD-10-CM

## 2022-06-03 DIAGNOSIS — M6281 Muscle weakness (generalized): Secondary | ICD-10-CM

## 2022-06-03 DIAGNOSIS — R931 Abnormal findings on diagnostic imaging of heart and coronary circulation: Secondary | ICD-10-CM

## 2022-06-03 NOTE — Therapy (Signed)
  OUTPATIENT PHYSICAL THERAPY TREATMENT NOTE   Patient Name: [name] MRN: [id number] DOB:08-22-1960, 62 y.o., female Today's Date: 06/03/2022  PCP: Creola Corn, MD  REFERRING PROVIDER: Marguerita Beards, MD   END OF SESSION:   PT End of Session - 06/03/22 1051     Visit Number 5    Date for PT Re-Evaluation 07/16/22    Authorization Type U[employer]    PT Start Time 1028    PT Stop Time 1045    PT Time Calculation (min) 17 min             Past Medical History:  Diagnosis Date   Back pain of lumbar region with sciatica    Cancer (HCC)    breast   Diabetes mellitus, type II (HCC)    Family history of anesthesia complication    grandfather died under anesthesia 70's- had heart issues   H/O carpal tunnel repair 2014   Headache(784.0)    Hyperlipidemia    Hypertension    Hypothyroidism    Medial meniscus tear    PONV (postoperative nausea and vomiting)    Sleep apnea    cpap since 10   Past Surgical History:  Procedure Laterality Date   BILATERAL TOTAL MASTECTOMY WITH AXILLARY LYMPH NODE DISSECTION     BREAST RECONSTRUCTION     CERVICAL DISC ARTHROPLASTY N/A 03/19/2013   Procedure: CERVICAL FIVE TO SIX, CERVICAL SIX TO SEVEN CERVICAL ANTERIOR DISC ARTHROPLASTY;  Surgeon: Barnett Abu, MD;  Location: [facility];  Service: Neurosurgery;  Laterality: N/A;  C56 C67 artificial disc replacement   CESAREAN SECTION     COLONOSCOPY     DILATATION & CURETTAGE/HYSTEROSCOPY WITH TRUECLEAR N/A 11/25/2013   Procedure: DILATATION & CURETTAGE/HYSTEROSCOPY WITH TRUCLEAR;  Surgeon: Meriel Pica, MD;  Location: [facility];  Service: Gynecology;  Laterality: N/A;   ECTOPIC PREGNANCY SURGERY     GALLBLADDER SURGERY     MOUTH SURGERY     child- dog bite   SPINAL FUSION     2023   TONSILLECTOMY     TUBAL LIGATION     Patient Active Problem List   Diagnosis Date Noted   Exertional dyspnea 04/17/2022   Elevated coronary artery calcium score 04/17/2022   Spondylolisthesis at  L4-L5 level 12/27/2021   Ductal carcinoma in situ (DCIS) of left breast 01/19/2018   Generalized anxiety disorder 11/07/2017   Depression 11/07/2017   Insomnia 11/07/2017   Cervical spondylosis 03/19/2013   REFERRING DIAG: W09.811 (ICD-10-CM) - Levator spasm    THERAPY DIAG:  M62.81 Muscle weakness (generalized) N39.41 Urgency Incontinence   Rationale for Evaluation and Treatment: Rehabilitation   ONSET DATE: 2021       Patient came and her treatment time was decreased with her having difficulty getting through the door and elayed checkout due to line of people. Her appointment was canceled and the futuro ones at [facility] appointments was changed to [facility].  Eulis Foster, PT 06/03/22 10:51 AM  Eulis Foster, PT 06/03/22 10:51 AM
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
## 2022-06-07 ENCOUNTER — Ambulatory Visit (INDEPENDENT_AMBULATORY_CARE_PROVIDER_SITE_OTHER): Payer: No Typology Code available for payment source | Admitting: Psychiatry

## 2022-06-07 ENCOUNTER — Encounter: Payer: Self-pay | Admitting: Psychiatry

## 2022-06-07 DIAGNOSIS — F32A Depression, unspecified: Secondary | ICD-10-CM | POA: Diagnosis not present

## 2022-06-07 DIAGNOSIS — F4310 Post-traumatic stress disorder, unspecified: Secondary | ICD-10-CM | POA: Diagnosis not present

## 2022-06-07 DIAGNOSIS — F411 Generalized anxiety disorder: Secondary | ICD-10-CM | POA: Diagnosis not present

## 2022-06-07 MED ORDER — VILAZODONE HCL 20 MG PO TABS
20.0000 mg | ORAL_TABLET | Freq: Every day | ORAL | 0 refills | Status: DC
Start: 2022-06-07 — End: 2022-08-02

## 2022-06-07 MED ORDER — ALPRAZOLAM 0.5 MG PO TABS: ORAL_TABLET | ORAL | 2 refills | Status: DC

## 2022-06-07 NOTE — Progress Notes (Signed)
[name] [id number] 09-27-1960 62 y.o.  Subjective:   Patient ID:  [name] is a 62 y.o. (DOB 09/22/1960) female.  Chief Complaint: No chief complaint on file.   Anxiety    [name] presents to the office today for follow-up of anxiety, depression, and insomnia.   She reports that she just learned today that a knee replacement was recommended. She reports that her BMI has been lowered and she is now a surgical candidate. She has been doing PT for back surgery.   She reports some situational anxiety. Had anxiety when she had to be around her husband (separated) and has had some interaction with him.  She reports that she has had some situational panic attacks. She has had some intrusive memories. Monday would have been she and her husband's 40th wedding anniversary and this has been a trigger for her. She reports some improvement in mood, anxiety, and depression. She reports that she is getting out of bed daily and regularly. Sleep has improved with increased activity. She reports adequate concentration. She reports eating less due to stress.  Denies SI.   Son and his wife came in from [place] for Mother's Day.   Gabapentin 300 mg TID last filled 05/29/22. Gabapentin 100 mg last filled 05/08/22.  Alprazolam last filled 05/22/22 x 2. Dayvigo last filled 04/09/22.   Past Psychiatric Medication Trials: Ambien- Has been taking 5-10 mg Intermezzo- Helpful for sleep initiation Xanax Effexor XR-Multiple adverse effects. Severe discontinuation s/s. Zonisamide- SI. Lexapro-Concentration difficulty Viibryd Elavil- Apatheric, difficulty with work Topamax- Anger, irritability Benadryl- partially effective.  Dayvigo- Effective. Has mild drowsiness the following day.  Doxepin- Excessive sedation Doxazosin Gabapentin   Flowsheet Row Admission (Discharged) from 12/27/2021 in [facility] Pre-Admission Testing 60 from 12/21/2021 in [facility]  PREADMISSION TESTING  C-SSRS RISK CATEGORY No Risk No Risk        Review of Systems:  Review of Systems  HENT:  Positive for ear pain.   Endocrine:       Improved glycemic control  Musculoskeletal:  Positive for gait problem.       Ambulates with cane  Allergic/Immunologic: Positive for environmental allergies.  Psychiatric/Behavioral:         Please refer to HPI    Medications: I have reviewed the patient's current medications.  Current Outpatient Medications  Medication Sig Dispense Refill  . ALPRAZolam (XANAX) 0.5 MG tablet Take 1/2-1 tablet three times daily as needed for anxiety or insomnia 75 tablet 2  . aspirin EC 81 MG tablet Take 1 tablet (81 mg total) by mouth daily. 90 tablet 3  . atorvastatin (LIPITOR) 20 MG tablet Take 1 tablet (20 mg total) by mouth daily. 90 tablet 3  . doxazosin (CARDURA) 4 MG tablet Take 1 tablet (4 mg total) by mouth at bedtime. 90 tablet 1  . famotidine-calcium carbonate-magnesium hydroxide (PEPCID COMPLETE) 10-800-165 MG chewable tablet Chew 1 tablet by mouth daily as needed.    . gabapentin (NEURONTIN) 100 MG capsule Take 1 capsule (100 mg total) by mouth 2 (two) times daily as needed (anxiety). 60 capsule 2  . gabapentin (NEURONTIN) 300 MG capsule Take 1 capsule (300 mg total) by mouth at bedtime. 90 capsule 0  . Galcanezumab-gnlm 120 MG/ML SOAJ Inject 120 mg into the skin every 30 (thirty) days.    . hydrochlorothiazide (MICROZIDE) 12.5 MG capsule Take 12.5 mg by mouth daily.    Marland Kitchen ibuprofen (ADVIL) 200 MG tablet Take  1 tablet (200 mg total) by mouth every 6 (six) hours as needed. 30 tablet 0  . JARDIANCE 10 MG TABS tablet Take 10 mg by mouth every morning.    . Lemborexant (DAYVIGO) 10 MG TABS Take 1 tablet (10 mg total) by mouth at bedtime. 90 tablet 1  . metFORMIN (GLUCOPHAGE) 500 MG tablet Take 500 mg by mouth daily with supper.    . metoprolol succinate (TOPROL-XL) 100 MG 24 hr tablet 1 tablet Orally Once a day for 90 days    . naproxen  sodium (ALEVE) 220 MG tablet Take 220 mg by mouth.    . nitroGLYCERIN (NITROSTAT) 0.4 MG SL tablet Place 1 tablet (0.4 mg total) under the tongue every 5 (five) minutes as needed for chest pain. 90 tablet 3  . OnabotulinumtoxinA (BOTOX IM) Inject into the muscle every 3 (three) months. FOR MIGRAINES    . rizatriptan (MAXALT-MLT) 10 MG disintegrating tablet Take 10 mg by mouth as needed for migraine.     . Tetrahydrozoline HCl (VISINE OP) Place 1 drop into both eyes daily as needed (irritation).    Marland Kitchen thyroid (ARMOUR) 30 MG tablet Take 30 mg by mouth daily before breakfast.     . UBRELVY 50 MG TABS Take 50 mg by mouth daily as needed (migraine).    . Vilazodone HCl 20 MG TABS Take 1 tablet (20 mg total) by mouth daily with breakfast. 90 tablet 0   No current facility-administered medications for this visit.   Facility-Administered Medications Ordered in Other Visits  Medication Dose Route Frequency Provider Last Rate Last Admin  . ondansetron (ZOFRAN) 4 mg in sodium chloride 0.9 % 50 mL IVPB  4 mg Intravenous Q6H PRN Barnett Abu, MD        Medication Side Effects: None  Allergies:  Allergies  Allergen Reactions  . Cyclobenzaprine Hives and Other (See Comments)  . Effexor [Venlafaxine Hcl]     Weight loss and cant sleep  . Linaclotide Other (See Comments)  . Venlafaxine Other (See Comments)    Other Reaction(s): anxiety/ extreme weight loss    Past Medical History:  Diagnosis Date  . Back pain of lumbar region with sciatica   . Cancer (HCC)    breast  . Diabetes mellitus, type II (HCC)   . Family history of anesthesia complication    grandfather died under anesthesia 70's- had heart issues  . H/O carpal tunnel repair 2014  . Headache(784.0)   . Hyperlipidemia   . Hypertension   . Hypothyroidism   . Medial meniscus tear   . PONV (postoperative nausea and vomiting)   . Sleep apnea    cpap since 10    Past Medical History, Surgical history, Social history, and Family  history were reviewed and updated as appropriate.   Please see review of systems for further details on the patient's review from today.   Objective:   Physical Exam:  LMP 11/12/2013   Physical Exam  Lab Review:     Component Value Date/Time   NA 137 12/28/2021 0626   K 3.7 12/28/2021 0626   CL 104 12/28/2021 0626   CO2 25 12/28/2021 0626   GLUCOSE 127 (H) 12/28/2021 0626   BUN 7 12/28/2021 0626   CREATININE 0.66 12/28/2021 0626   CREATININE 0.73 01/19/2018 1556   CALCIUM 8.4 (L) 12/28/2021 0626   PROT 6.8 01/19/2018 1556   ALBUMIN 3.6 01/19/2018 1556   AST 15 01/19/2018 1556   ALT 15 01/19/2018 1556   ALKPHOS  53 01/19/2018 1556   BILITOT 0.3 01/19/2018 1556   GFRNONAA >60 12/28/2021 0626   GFRNONAA >60 01/19/2018 1556   GFRAA >60 01/19/2018 1556       Component Value Date/Time   WBC 7.7 12/28/2021 0626   RBC 4.14 12/28/2021 0626   HGB 12.2 12/28/2021 0626   HGB 12.2 01/19/2018 1556   HCT 36.7 12/28/2021 0626   PLT 181 12/28/2021 0626   PLT 263 01/19/2018 1556   MCV 88.6 12/28/2021 0626   MCH 29.5 12/28/2021 0626   MCHC 33.2 12/28/2021 0626   RDW 13.0 12/28/2021 0626   LYMPHSABS 2.3 01/19/2018 1556   MONOABS 0.6 01/19/2018 1556   EOSABS 0.6 (H) 01/19/2018 1556   BASOSABS 0.1 01/19/2018 1556    No results found for: "POCLITH", "LITHIUM"   No results found for: "PHENYTOIN", "PHENOBARB", "VALPROATE", "CBMZ"   .res Assessment: Plan:    There are no diagnoses linked to this encounter.   Please see After Visit Summary for patient specific instructions.  Future Appointments  Date Time Provider Department Center  06/11/2022 11:30 AM Theressa Millard, PT [facility]  06/18/2022 10:30 AM Theressa Millard, PT [facility]  06/18/2022 11:30 AM Theressa Millard, PT [facility]  06/25/2022 11:30 AM Theressa Millard, PT [facility]  06/25/2022  1:45 PM Huston Foley, MD [facility] None  06/27/2022 10:30 AM Theressa Millard, PT [facility]  07/02/2022 10:30 AM Theressa Millard, PT  [facility]  07/04/2022 11:30 AM Theressa Millard, PT [facility]  07/09/2022 11:30 AM Theressa Millard, PT [facility]  07/11/2022 11:30 AM Theressa Millard, PT [facility]  07/16/2022 11:30 AM Theressa Millard, PT [facility]  07/22/2022  8:40 AM Selmer Dominion, NP [facility]    No orders of the defined types were placed in this encounter.   -------------------------------

## 2022-06-10 ENCOUNTER — Ambulatory Visit: Payer: No Typology Code available for payment source | Admitting: Physical Therapy

## 2022-06-11 ENCOUNTER — Encounter: Payer: No Typology Code available for payment source | Admitting: Physical Therapy

## 2022-06-11 ENCOUNTER — Encounter: Payer: Self-pay | Admitting: Physical Therapy

## 2022-06-11 DIAGNOSIS — N3941 Urge incontinence: Secondary | ICD-10-CM

## 2022-06-11 DIAGNOSIS — M6281 Muscle weakness (generalized): Secondary | ICD-10-CM | POA: Diagnosis not present

## 2022-06-11 NOTE — Therapy (Signed)
OUTPATIENT PHYSICAL THERAPY TREATMENT NOTE   Patient Name: Alicia RICKERD MRN: [id number] DOB:January 19, 1961, 62 y.o., female Today's Date: 06/11/2022  PCP: Creola Corn, MD  REFERRING PROVIDER:  Marguerita Beards, MD   END OF SESSION:   PT End of Session - 06/11/22 1145     Visit Number 6    Date for PT Re-Evaluation 07/16/22    Authorization Type [employer]    PT Start Time 1135    PT Stop Time 1220    PT Time Calculation (min) 45 min    Activity Tolerance Patient tolerated treatment well    Behavior During Therapy WFL for tasks assessed/performed             Past Medical History:  Diagnosis Date   Back pain of lumbar region with sciatica    Cancer (HCC)    breast   Diabetes mellitus, type II (HCC)    Family history of anesthesia complication    grandfather died under anesthesia 70's- had heart issues   H/O carpal tunnel repair 2014   Headache(784.0)    Hyperlipidemia    Hypertension    Hypothyroidism    Medial meniscus tear    PONV (postoperative nausea and vomiting)    Sleep apnea    cpap since 10   Past Surgical History:  Procedure Laterality Date   BILATERAL TOTAL MASTECTOMY WITH AXILLARY LYMPH NODE DISSECTION     BREAST RECONSTRUCTION     CERVICAL DISC ARTHROPLASTY N/A 03/19/2013   Procedure: CERVICAL FIVE TO SIX, CERVICAL SIX TO SEVEN CERVICAL ANTERIOR DISC ARTHROPLASTY;  Surgeon: Barnett Abu, MD;  Location: [facility];  Service: Neurosurgery;  Laterality: N/A;  C56 C67 artificial disc replacement   CESAREAN SECTION     COLONOSCOPY     DILATATION & CURETTAGE/HYSTEROSCOPY WITH TRUECLEAR N/A 11/25/2013   Procedure: DILATATION & CURETTAGE/HYSTEROSCOPY WITH TRUCLEAR;  Surgeon: Meriel Pica, MD;  Location: [facility];  Service: Gynecology;  Laterality: N/A;   ECTOPIC PREGNANCY SURGERY     GALLBLADDER SURGERY     MOUTH SURGERY     child- dog bite   SPINAL FUSION     2023   TONSILLECTOMY     TUBAL LIGATION     Patient Active Problem List   Diagnosis  Date Noted   Exertional dyspnea 04/17/2022   Elevated coronary artery calcium score 04/17/2022   Spondylolisthesis at L4-L5 level 12/27/2021   Ductal carcinoma in situ (DCIS) of left breast 01/19/2018   Generalized anxiety disorder 11/07/2017   Depression 11/07/2017   Insomnia 11/07/2017   Cervical spondylosis 03/19/2013   REFERRING DIAG: W09.811 (ICD-10-CM) - Levator spasm    THERAPY DIAG:  M62.81 Muscle weakness (generalized) N39.41 Urgency Incontinence   Rationale for Evaluation and Treatment: Rehabilitation   ONSET DATE: 2021   SUBJECTIVE:  SUBJECTIVE STATEMENT: Urinary leakage just once. Not losing the urine in the middle of the night.   PAIN:  Are you having pain? Yes NPRS scale: 7/10 Pain location:  back   Pain type: pins and needles Pain description: constant    Aggravating factors: moving, sitting  Relieving factors: rest   PRECAUTIONS: Other: recent lumbar fusion 12/23   WEIGHT BEARING RESTRICTIONS: No   FALLS:  Has patient fallen in last 6 months? No   LIVING ENVIRONMENT: Lives with: lives with their daughter   OCCUPATION: recovering from back surgery   PLOF: Independent   PATIENT GOALS: reduce urinary leakage, not have to clean up bathroom in middle of night due to accident   PERTINENT HISTORY:  Breast cancer; DM; Hypothyroidism; sleep apnea; bilateral total mastectomy; c-section; cervical disc arthroplasty; Gallbladder removal; L3-L5 spinal fusion 01/16/22 Sexual abuse: No   BOWEL MOVEMENT: Pain with bowel movement: No Type of bowel movement:Type (Bristol Stool Scale) varies, Frequency every 2-3 days, Strain Yes, and Splinting yes Fully empty rectum: No due to back surgery  Leakage: No Fiber supplement: Yes: stool softner   URINATION: Pain with urination:  No Fully empty bladder: No, Has to lean forward or backward to empty completely ; When urinating, she feels difficulty starting urine stream, dribbling after finishing, and the need to urinate multiple times in a row  Stream: Strong Urgency: Yes:   patient does not wake up in time to go to the bathroom and still recovering from back surger.  Frequency: Day time voids 6.  Nocturia: 1-2 times per night to void.  Leakage: Walking to the bathroom, Coughing, Sneezing, Exercise, Intercourse, and going from sitting to standing, with a full bladder, without sensation ; patient gets the urge to urinate in standing and not sitting.  Pads: No   INTERCOURSE: not sexually active   PREGNANCY: C-section deliveries 3     OBJECTIVE:    DIAGNOSTIC FINDINGS:  Pelvic floor strength I/V   PVR of 22 ml was obtained by bladder scan      COGNITION: Overall cognitive status: Within functional limits for tasks assessed                          SENSATION: Light touch: Appears intact Proprioception: Appears intact     GAIT: Distance walked: 200 feet Assistive device utilized: Single point cane Level of assistance: Complete Independence Comments: recovering from back fusion   POSTURE: rounded shoulders, forward head, and decreased lumbar lordosis   PELVIC ALIGNMENT:   LUMBARAROM/PROM: not tested due to recent back surgery     LOWER EXTREMITY ROM:   Passive ROM Right eval Left eval  Hip flexion 115 110  Hip internal rotation -5 0  Hip external rotation 70 70   (Blank rows = not tested)         PALPATION:   General  unable to tighten her lower abdominals, tenderness located in the lower abdominal, restrictions of the c-section scar                 External Perineal Exam vaginal dryness                             Internal Pelvic Floor tenderness located on bilateral levator ani, obturator internist, sides of the urethra and bladder   Patient confirms identification and approves PT to  assess internal pelvic floor and treatment Yes   PELVIC MMT:  MMT eval 05/16/22 05/30/22 06/11/22  Vaginal 3/5 anterior and posterior 2/5 laterally with right side weaker 3/5 with circular contraction 4/5 with good lift and circular contraction 4/5 in supine  (Blank rows = not tested)         TONE: increased     TODAY'S TREATMENT:   06/11/22 Manual: Internal pelvic floor techniques: No emotional/communication barriers or cognitive limitation. Patient is motivated to learn. Patient understands and agrees with treatment goals and plan. PT explains patient will be examined in standing, sitting, and lying down to see how their muscles and joints work. When they are ready, they will be asked to remove their underwear so PT can examine their perineum. The patient is also given the option of providing their own chaperone as one is not provided in our facility. The patient also has the right and is explained the right to defer or refuse any part of the evaluation or treatment including the internal exam. With the patient's consent, PT will use one gloved finger to gently assess the muscles of the pelvic floor, seeing how well it contracts and relaxes and if there is muscle symmetry. After, the patient will get dressed and PT and patient will discuss exam findings and plan of care. PT and patient discuss plan of care, schedule, attendance policy and HEP activities.  Going through the vagina working on the perineal body, superior transverse, levator ani and obturator internist.  Neuromuscular re-education: Pelvic floor contraction training: Therapist finger in the  vaginal canal working on  pelvic floor contraction and abdominal contraction  Knee fallout 10 x each side Marching 10 x each side Abdominal isometrics with hands pressing on the thighs     05/30/22 Manual: Internal pelvic floor techniques: No emotional/communication barriers or cognitive limitation. Patient is motivated to learn. Patient  understands and agrees with treatment goals and plan. PT explains patient will be examined in standing, sitting, and lying down to see how their muscles and joints work. When they are ready, they will be asked to remove their underwear so PT can examine their perineum. The patient is also given the option of providing their own chaperone as one is not provided in our facility. The patient also has the right and is explained the right to defer or refuse any part of the evaluation or treatment including the internal exam. With the patient's consent, PT will use one gloved finger to gently assess the muscles of the pelvic floor, seeing how well it contracts and relaxes and if there is muscle symmetry. After, the patient will get dressed and PT and patient will discuss exam findings and plan of care. PT and patient discuss plan of care, schedule, attendance policy and HEP activities.  Manual work to the perineal body working through the restrictions to improve tissue mobility and reduce pain Going through the vaginal working on the [employer], obturator internist, along the pubovaginalis, urogenital diaphragm Neuromuscular re-education: Pelvic floor contraction training: Therapist finger in the vaginal canal working on quick contractions, holding for 5 seconds, slowly contract upward and downward, not lifting her rib cage while contracting the pelvic floor    05/21/22 Manual: Internal pelvic floor techniques: No emotional/communication barriers or cognitive limitation. Patient is motivated to learn. Patient understands and agrees with treatment goals and plan. PT explains patient will be examined in standing, sitting, and lying down to see how their muscles and joints work. When they are ready, they will be asked to remove their underwear so PT can examine  their perineum. The patient is also given the option of providing their own chaperone as one is not provided in our facility. The patient also has the  right and is explained the right to defer or refuse any part of the evaluation or treatment including the internal exam. With the patient's consent, PT will use one gloved finger to gently assess the muscles of the pelvic floor, seeing how well it contracts and relaxes and if there is muscle symmetry. After, the patient will get dressed and PT and patient will discuss exam findings and plan of care. PT and patient discuss plan of care, schedule, attendance policy and HEP activities.  Going through the vaginal canal working along the levator ani, puborectalis, pubovaginalis, and right side of the bladder. Going very slowly monitoring for trigger points and pain. Patient was very sensitive so therapist had to adjust her pressure.  Neuromuscular re-education: Down training: Diaphragmatic breathing to elongate the pelvic floor muscles while therapist had her finger in the vaginal canal.        PATIENT EDUCATION: 06/11/22 Education details: Access Code: [email or web address] Person educated: Patient Education method: Programmer, multimedia, Demonstration, Actor cues, Verbal cues, and Handouts Education comprehension: verbalized understanding, returned demonstration, verbal cues required, tactile cues required, and needs further education     HOME EXERCISE PROGRAM: 06/11/22 Access Code: [facility] URL: [email or web address] Date: 06/11/2022 Prepared by: Eulis Foster  Exercises - Supine Diaphragmatic Breathing  - 3 x daily - 7 x weekly - 1 sets - 10 reps - Supine March with Posterior Pelvic Tilt  - 1 x daily - 4 x weekly - 1 sets - 10 reps - Bent Knee Fallouts  - 1 x daily - 4 x weekly - 1 sets - 10 reps - Supine Transversus Abdominis Bracing - Hands on Thighs  - 1 x daily - 7 x weekly - 3 sets - 10 reps     ASSESSMENT:   CLINICAL IMPRESSION: Patient is a 62 y.o. female who was seen today for physical therapy  treatment for levator ani spasm.  Patient does not leak urine in the middle of the night. She  leaked one time since last visit and small amount. Pelvic floor strength is 4/5 in supine. She is learning how to contract the pelvic floor with abdominal contraction and tactile cues to the pelvic floor. Patient will benefit from skilled therapy to elongate her pelvic floor so she will be able to coordinate the muscles to reduce her urinary leakage.    OBJECTIVE IMPAIRMENTS: decreased endurance, decreased ROM, decreased strength, increased fascial restrictions, and increased muscle spasms.    ACTIVITY LIMITATIONS: sitting, standing, transfers, continence, and locomotion level   PARTICIPATION LIMITATIONS: meal prep, cleaning, laundry, and community activity   PERSONAL FACTORS: Age, Past/current experiences, Time since onset of injury/illness/exacerbation, and 3+ comorbidities: Breast cancer; DM; Hypothyroidism; sleep apnea; bilateral total mastectomy; c-section; cervical disc arthroplasty; Gallbladder removal; L3-L5 spinal fusion 01/16/22  are also affecting patient's functional outcome.    REHAB POTENTIAL: Good   CLINICAL DECISION MAKING: Evolving/moderate complexity   EVALUATION COMPLEXITY: Moderate     GOALS: Goals reviewed with patient? Yes   SHORT TERM GOALS: Target date: 05/20/22   Patient is independent with initial HEP  for c-section scar massage to improve tissue mobility.  Baseline: Goal status: Met 05/09/22   2.  Patient is independent with initial HEP for hip mobility and pelvic floor drop to lengthen the muscles.  Baseline:  Goal status: ongoing 05/21/22   3.  Patient able to  perform diaphragmatic breathing to relax her pelvic floor.  Baseline:  Goal status: Met 05/21/22     LONG TERM GOALS: Target date: 07/16/22   Patient independent with advanced HEP for pelvic floor strengthening and elongation to reduce her urinary leakage.  Baseline:  Goal status: INITIAL   2.  Pelvic floor strength is >/= 3/5 with circular hug of therapist finger to reduce her urinary leakage.   Baseline:  Goal status: Met 5.9.24   3.  Patient is able to wake up in the middle of the night and walk to the bathroom without leaking urine due to improve pelvic floor coordination.  Baseline:  Goal status: Met 06/11/22   4.   Urinary leakage with coughing, sneezing and laughing is reduced >/= 75% due to improved pelvic floor strength and coordination.  Baseline:  Goal status: INITIAL     5.  Bilateral hip flexion P/ROM is >/= 115 degrees so she is able to sit on the commode and fully empty her bladder.  Baseline:  Goal status: INITIAL     PLAN:   PT FREQUENCY: 1-2x/week   PT DURATION: 12 weeks   PLANNED INTERVENTIONS: Therapeutic exercises, Therapeutic activity, Neuromuscular re-education, Patient/Family education, Joint mobilization, Aquatic Therapy, Dry Needling, Electrical stimulation, Cryotherapy, Moist heat, scar mobilization, Taping, Ultrasound, Biofeedback, and Manual therapy   PLAN FOR NEXT SESSION:  hip stretches, see what exercises she is doing for her back, abdominal exercises low level  Eulis Foster, PT 06/11/22 12:26 PM

## 2022-06-12 ENCOUNTER — Encounter: Payer: No Typology Code available for payment source | Admitting: Physical Therapy

## 2022-06-12 ENCOUNTER — Ambulatory Visit: Payer: No Typology Code available for payment source | Admitting: Adult Health

## 2022-06-18 ENCOUNTER — Encounter: Payer: Self-pay | Admitting: Physical Therapy

## 2022-06-18 ENCOUNTER — Encounter: Payer: No Typology Code available for payment source | Admitting: Physical Therapy

## 2022-06-18 DIAGNOSIS — M6281 Muscle weakness (generalized): Secondary | ICD-10-CM | POA: Diagnosis not present

## 2022-06-18 DIAGNOSIS — N3941 Urge incontinence: Secondary | ICD-10-CM

## 2022-06-18 NOTE — Therapy (Signed)
OUTPATIENT PHYSICAL THERAPY TREATMENT NOTE   Patient Name: [name] MRN: [id number] DOB:1960/12/28, 62 y.o., female Today's Date: 06/18/2022  PCP: Creola Corn, MD  REFERRING PROVIDER:  Marguerita Beards, MD   END OF SESSION:   PT End of Session - 06/18/22 1140     Visit Number 7    Date for PT Re-Evaluation 07/16/22    Authorization Type [employer]    PT Start Time 1140    PT Stop Time 1220    PT Time Calculation (min) 40 min    Activity Tolerance Patient tolerated treatment well    Behavior During Therapy WFL for tasks assessed/performed             Past Medical History:  Diagnosis Date   Back pain of lumbar region with sciatica    Cancer (HCC)    breast   Diabetes mellitus, type II (HCC)    Family history of anesthesia complication    grandfather died under anesthesia 70's- had heart issues   H/O carpal tunnel repair 2014   Headache(784.0)    Hyperlipidemia    Hypertension    Hypothyroidism    Medial meniscus tear    PONV (postoperative nausea and vomiting)    Sleep apnea    cpap since 10   Past Surgical History:  Procedure Laterality Date   BILATERAL TOTAL MASTECTOMY WITH AXILLARY LYMPH NODE DISSECTION     BREAST RECONSTRUCTION     CERVICAL DISC ARTHROPLASTY N/A 03/19/2013   Procedure: CERVICAL FIVE TO SIX, CERVICAL SIX TO SEVEN CERVICAL ANTERIOR DISC ARTHROPLASTY;  Surgeon: Barnett Abu, MD;  Location: [facility];  Service: Neurosurgery;  Laterality: N/A;  C56 C67 artificial disc replacement   CESAREAN SECTION     COLONOSCOPY     DILATATION & CURETTAGE/HYSTEROSCOPY WITH TRUECLEAR N/A 11/25/2013   Procedure: DILATATION & CURETTAGE/HYSTEROSCOPY WITH TRUCLEAR;  Surgeon: Meriel Pica, MD;  Location: [facility];  Service: Gynecology;  Laterality: N/A;   ECTOPIC PREGNANCY SURGERY     GALLBLADDER SURGERY     MOUTH SURGERY     child- dog bite   SPINAL FUSION     2023   TONSILLECTOMY     TUBAL LIGATION     Patient Active Problem List   Diagnosis  Date Noted   Exertional dyspnea 04/17/2022   Elevated coronary artery calcium score 04/17/2022   Spondylolisthesis at L4-L5 level 12/27/2021   Ductal carcinoma in situ (DCIS) of left breast 01/19/2018   Generalized anxiety disorder 11/07/2017   Depression 11/07/2017   Insomnia 11/07/2017   Cervical spondylosis 03/19/2013   REFERRING DIAG: W09.811 (ICD-10-CM) - Levator spasm    THERAPY DIAG:  M62.81 Muscle weakness (generalized) N39.41 Urgency Incontinence   Rationale for Evaluation and Treatment: Rehabilitation   ONSET DATE: 2021   SUBJECTIVE:  SUBJECTIVE STATEMENT: No urinary leakage since last visit.   PAIN:  Are you having pain? Yes NPRS scale: 7/10 Pain location:  back   Pain type: pins and needles Pain description: constant    Aggravating factors: moving, sitting  Relieving factors: rest   PRECAUTIONS: Other: recent lumbar fusion 12/23   WEIGHT BEARING RESTRICTIONS: No   FALLS:  Has patient fallen in last 6 months? No   LIVING ENVIRONMENT: Lives with: lives with their daughter   OCCUPATION: recovering from back surgery   PLOF: Independent   PATIENT GOALS: reduce urinary leakage, not have to clean up bathroom in middle of night due to accident   PERTINENT HISTORY:  Breast cancer; DM; Hypothyroidism; sleep apnea; bilateral total mastectomy; c-section; cervical disc arthroplasty; Gallbladder removal; L3-L5 spinal fusion 01/16/22 Sexual abuse: No   BOWEL MOVEMENT: Pain with bowel movement: No Type of bowel movement:Type (Bristol Stool Scale) varies, Frequency every 2-3 days, Strain Yes, and Splinting yes Fully empty rectum: No due to back surgery  Leakage: No Fiber supplement: Yes: stool softner   URINATION: Pain with urination: No Fully empty bladder: No, Has to lean  forward or backward to empty completely ; When urinating, she feels difficulty starting urine stream, dribbling after finishing, and the need to urinate multiple times in a row  Stream: Strong Urgency: Yes:   patient does not wake up in time to go to the bathroom and still recovering from back surger.  Frequency: Day time voids 6.  Nocturia: 1-2 times per night to void.  Leakage: Walking to the bathroom, Coughing, Sneezing, Exercise, Intercourse, and going from sitting to standing, with a full bladder, without sensation ; patient gets the urge to urinate in standing and not sitting.  Pads: No   INTERCOURSE: not sexually active   PREGNANCY: C-section deliveries 3     OBJECTIVE:    DIAGNOSTIC FINDINGS:  Pelvic floor strength I/V   PVR of 22 ml was obtained by bladder scan      COGNITION: Overall cognitive status: Within functional limits for tasks assessed                          SENSATION: Light touch: Appears intact Proprioception: Appears intact     GAIT: Distance walked: 200 feet Assistive device utilized: Single point cane Level of assistance: Complete Independence Comments: recovering from back fusion   POSTURE: rounded shoulders, forward head, and decreased lumbar lordosis   PELVIC ALIGNMENT:   LUMBARAROM/PROM: not tested due to recent back surgery     LOWER EXTREMITY ROM:   Passive ROM Right eval Left eval  Hip flexion 115 110  Hip internal rotation -5 0  Hip external rotation 70 70   (Blank rows = not tested)         PALPATION:   General  unable to tighten her lower abdominals, tenderness located in the lower abdominal, restrictions of the c-section scar                 External Perineal Exam vaginal dryness                             Internal Pelvic Floor tenderness located on bilateral levator ani, obturator internist, sides of the urethra and bladder   Patient confirms identification and approves PT to assess internal pelvic floor and treatment  Yes   PELVIC MMT:   MMT eval 05/16/22 05/30/22 06/11/22  Vaginal 3/5 anterior and posterior 2/5 laterally with right side weaker 3/5 with circular contraction 4/5 with good lift and circular contraction 4/5 in supine  (Blank rows = not tested)         TONE: increased     TODAY'S TREATMENT:   06/18/22 Exercises: Strengthening: Knee fallout 10 x each side Marching 10 x each side Abdominal isometrics with hands pressing on the thighs Bilateral shoulder extension in hookly 20 x  Hookly shoulder flexion alternating with ball squeeze 20 x ; tried some resistance but had a little back pain so stopped Bridges 3 times but had some back pain    06/11/22 Manual: Internal pelvic floor techniques: No emotional/communication barriers or cognitive limitation. Patient is motivated to learn. Patient understands and agrees with treatment goals and plan. PT explains patient will be examined in standing, sitting, and lying down to see how their muscles and joints work. When they are ready, they will be asked to remove their underwear so PT can examine their perineum. The patient is also given the option of providing their own chaperone as one is not provided in our facility. The patient also has the right and is explained the right to defer or refuse any part of the evaluation or treatment including the internal exam. With the patient's consent, PT will use one gloved finger to gently assess the muscles of the pelvic floor, seeing how well it contracts and relaxes and if there is muscle symmetry. After, the patient will get dressed and PT and patient will discuss exam findings and plan of care. PT and patient discuss plan of care, schedule, attendance policy and HEP activities.  Going through the vagina working on the perineal body, superior transverse, levator ani and obturator internist.  Neuromuscular re-education: Pelvic floor contraction training: Therapist finger in the  vaginal canal working on  pelvic  floor contraction and abdominal contraction  Knee fallout 10 x each side Marching 10 x each side Abdominal isometrics with hands pressing on the thighs     05/30/22 Manual: Internal pelvic floor techniques: No emotional/communication barriers or cognitive limitation. Patient is motivated to learn. Patient understands and agrees with treatment goals and plan. PT explains patient will be examined in standing, sitting, and lying down to see how their muscles and joints work. When they are ready, they will be asked to remove their underwear so PT can examine their perineum. The patient is also given the option of providing their own chaperone as one is not provided in our facility. The patient also has the right and is explained the right to defer or refuse any part of the evaluation or treatment including the internal exam. With the patient's consent, PT will use one gloved finger to gently assess the muscles of the pelvic floor, seeing how well it contracts and relaxes and if there is muscle symmetry. After, the patient will get dressed and PT and patient will discuss exam findings and plan of care. PT and patient discuss plan of care, schedule, attendance policy and HEP activities.  Manual work to the perineal body working through the restrictions to improve tissue mobility and reduce pain Going through the vaginal working on the [employer], obturator internist, along the pubovaginalis, urogenital diaphragm Neuromuscular re-education: Pelvic floor contraction training: Therapist finger in the vaginal canal working on quick contractions, holding for 5 seconds, slowly contract upward and downward, not lifting her rib cage while contracting the pelvic floor        PATIENT EDUCATION:  06/11/22 Education details: Access Code: [email or web address] Person educated: Patient Education method: Explanation, Demonstration, Actor cues, Verbal cues, and Handouts Education comprehension: verbalized understanding,  returned demonstration, verbal cues required, tactile cues required, and needs further education     HOME EXERCISE PROGRAM: 06/11/22 Access Code: [facility] URL: [email or web address] Date: 06/11/2022 Prepared by: Eulis Foster  Exercises - Supine Diaphragmatic Breathing  - 3 x daily - 7 x weekly - 1 sets - 10 reps - Supine March with Posterior Pelvic Tilt  - 1 x daily - 4 x weekly - 1 sets - 10 reps - Bent Knee Fallouts  - 1 x daily - 4 x weekly - 1 sets - 10 reps - Supine Transversus Abdominis Bracing - Hands on Thighs  - 1 x daily - 7 x weekly - 3 sets - 10 reps     ASSESSMENT:   CLINICAL IMPRESSION: Patient is a 62 y.o. female who was seen today for physical therapy  treatment for levator ani spasm.  Patient has not leaked urine since last visit. Patient is working on core exercises to strengthen her pelvic floor while being careful of her back. She is doing well with getting up and down from the bed. Patient does report she has to urinate frequently.  Patient will benefit from skilled therapy to elongate her pelvic floor so she will be able to coordinate the muscles to reduce her urinary leakage.    OBJECTIVE IMPAIRMENTS: decreased endurance, decreased ROM, decreased strength, increased fascial restrictions, and increased muscle spasms.    ACTIVITY LIMITATIONS: sitting, standing, transfers, continence, and locomotion level   PARTICIPATION LIMITATIONS: meal prep, cleaning, laundry, and community activity   PERSONAL FACTORS: Age, Past/current experiences, Time since onset of injury/illness/exacerbation, and 3+ comorbidities: Breast cancer; DM; Hypothyroidism; sleep apnea; bilateral total mastectomy; c-section; cervical disc arthroplasty; Gallbladder removal; L3-L5 spinal fusion 01/16/22  are also affecting patient's functional outcome.    REHAB POTENTIAL: Good   CLINICAL DECISION MAKING: Evolving/moderate complexity   EVALUATION COMPLEXITY: Moderate     GOALS: Goals  reviewed with patient? Yes   SHORT TERM GOALS: Target date: 05/20/22   Patient is independent with initial HEP  for c-section scar massage to improve tissue mobility.  Baseline: Goal status: Met 05/09/22   2.  Patient is independent with initial HEP for hip mobility and pelvic floor drop to lengthen the muscles.  Baseline:  Goal status: Met 06/18/22   3.  Patient able to perform diaphragmatic breathing to relax her pelvic floor.  Baseline:  Goal status: Met 05/21/22     LONG TERM GOALS: Target date: 07/16/22   Patient independent with advanced HEP for pelvic floor strengthening and elongation to reduce her urinary leakage.  Baseline:  Goal status: Ongoing 06/18/22   2.  Pelvic floor strength is >/= 3/5 with circular hug of therapist finger to reduce her urinary leakage.  Baseline:  Goal status: Met 5.9.24   3.  Patient is able to wake up in the middle of the night and walk to the bathroom without leaking urine due to improve pelvic floor coordination.  Baseline:  Goal status: Met 06/11/22   4.   Urinary leakage with coughing, sneezing and laughing is reduced >/= 75% due to improved pelvic floor strength and coordination.  Baseline:  Goal status: ongoing 06/18/22     5.  Bilateral hip flexion P/ROM is >/= 115 degrees so she is able to sit on the commode and fully empty her bladder.  Baseline:  Goal status: INITIAL  PLAN:   PT FREQUENCY: 1-2x/week   PT DURATION: 12 weeks   PLANNED INTERVENTIONS: Therapeutic exercises, Therapeutic activity, Neuromuscular re-education, Patient/Family education, Joint mobilization, Aquatic Therapy, Dry Needling, Electrical stimulation, Cryotherapy, Moist heat, scar mobilization, Taping, Ultrasound, Biofeedback, and Manual therapy   PLAN FOR NEXT SESSION:  hip stretches, abdominal exercises low level, check hip flexion PROM  Eulis Foster, PT 06/18/22 12:33 PM

## 2022-06-21 ENCOUNTER — Encounter: Payer: No Typology Code available for payment source | Admitting: Physical Therapy

## 2022-06-25 ENCOUNTER — Encounter: Payer: Self-pay | Admitting: Neurology

## 2022-06-25 ENCOUNTER — Encounter
Payer: No Typology Code available for payment source | Attending: Obstetrics and Gynecology | Admitting: Physical Therapy

## 2022-06-25 ENCOUNTER — Encounter: Payer: Self-pay | Admitting: Physical Therapy

## 2022-06-25 ENCOUNTER — Ambulatory Visit (INDEPENDENT_AMBULATORY_CARE_PROVIDER_SITE_OTHER): Payer: No Typology Code available for payment source | Admitting: Neurology

## 2022-06-25 VITALS — BP 120/72 | HR 73 | Ht 62.0 in | Wt 220.1 lb

## 2022-06-25 DIAGNOSIS — Z789 Other specified health status: Secondary | ICD-10-CM

## 2022-06-25 DIAGNOSIS — G4733 Obstructive sleep apnea (adult) (pediatric): Secondary | ICD-10-CM | POA: Diagnosis not present

## 2022-06-25 DIAGNOSIS — N3941 Urge incontinence: Secondary | ICD-10-CM | POA: Diagnosis present

## 2022-06-25 DIAGNOSIS — M6281 Muscle weakness (generalized): Secondary | ICD-10-CM | POA: Insufficient documentation

## 2022-06-25 NOTE — Progress Notes (Signed)
Subjective:    Patient ID: [name] is a 62 y.o. female.  HPI    Interim history:   Ms. [name] is a 62 year old right-handed woman with an underlying medical history of hypertension, hypothyroidism, migraine headaches, allergic rhinitis, plantar fasciitis, cervical disc disease with myelopathy, status post neck surgery on 03/19/2013, status post low back surgery in December 2033, hyperlipidemia, obesity, and DCIS with s/p b/l mastectomies, who presents for follow-up consultation of her obstructive sleep apnea, well-established on CPAP therapy.  The patient is unaccompanied today.  I last saw her on 06/27/2020, at which time she was compliant with her CPAP.  She was advised to follow-up routinely in 1 year.  She was taking Belsomra for sleep.  Her insurance did not cover Dayvigo.  She saw Butch Penny, NP 06/11/2021, at which time she was doing well with her CPAP of 9 cm.  She was advised to follow-up routinely in 1 year.  Today, 06/25/2022: I reviewed her CPAP compliance data from 05/25/2022 through 06/23/2022, which is a total of 30 days, during which time she used her machine every night with percent use days greater than 4 hours at 100%, indicating superb compliance with an average usage of 9 hours and 38 minutes, residual AHI at goal at 0.7/h, leak acceptable with the 95th percentile at 4.8 L/min on a pressure of 9 cm with EPR of 3.  She reports doing well with her PAP machine.  She has a scheduled right total knee replacement in the fall, with Dr. Despina Hick. She had a interim lumbar fusion surgery with Dr. Danielle Dess in December 2023.  She has had issues with hair loss right in the crown area where her nasal cushion tubing hits on the top of her head.  She is wondering about an alternative mask and headgear.  She is also eligible for a new machine, set up date was July 2018.  The patient's allergies, current medications, family history, past medical history, past social history, past surgical history  and problem list were reviewed and updated as appropriate.    Previously (copied from previous notes for reference):    I saw her on 05/20/2019, at which time she was fully compliant with her CPAP and doing well.   I reviewed her CPAP compliance data from 05/27/2020 through 06/25/2020, which is a total of 30 days, during which time she used her machine every night with percent use days greater than 4 hours at 100%, indicating superb compliance with an average usage of 9 hours and 54 minutes, residual AHI at goal at 0.6/h, leak low with a 95th percentile at 0.9 L/min on a pressure of 9 cm with EPR of 3.     I saw her in a virtual visit on 04/23/2018, at which time she was stable on her CPAP.  She was advised to follow-up in 1 year.   I reviewed her CPAP compliance data from 04/19/2019 through 05/18/2019, which is a total of 30 days, during which time she used her machine every night with percent used days greater than 4 hours at 100%, indicating superb compliance with an average usage of 9 hours and 38 minutes. Residual AHI low and at goal at 0.4/h, leak low with a 95th percentile at 0.7 L/min on a pressure of 9 cm with EPR of 1.        I saw her on 04/21/2017, at which time she was compliant with her CPAP. She reported doing well. She was supposed to have a cardiac  CT. She was in the interim diagnosed with high-grade DCIS on the left side and underwent bilateral mastectomies in November 2019.   I reviewed her CPAP compliance data from 03/22/2018 through 04/20/2018 which is a total of 30 days, during which time she used her machine every night with percent used days greater than 4 hours at 100%, indicating superb compliance with an average usage of 9 hours and 41 minutes which is rather high, residual AHI at goal at 0.4 per hour, leak on the low side with the 95th percentile at 5.4 L/m on a pressure of 9 cm with EPR of 3.     I first met her on 06/03/2013 at the request of her primary care physician, at  which time she reported a prior diagnosis of OSA and she was using CPAP but her machine was defective and she needed reevaluation as she had not had a sleep study in over 8 years. She was compliant with her CPAP at the time. She was invited for sleep study testing. She had a baseline sleep study on 06/18/2016, which showed a sleep efficiency of 77.3%, sleep latency of 37 minutes. She had a decreased percentage of REM sleep at an increased percentage of stage II sleep. Overall AHI was 0.2 per hour, REM AHI was 3.3 per hour, supine AHI was 0 per hour. Average oxygen saturation was 98%, nadir was 93%. She had no significant PLMS. She was offered a home sleep test, which she had on 07/10/2016. AHI was 6.7 per hour, average oxygen saturation 94%, nadir of 78%. Based on the home sleep test results she was placed on CPAP therapy at home. She had a compliance follow-up appointment with Butch Penny, nurse practitioner on 10/23/2016 at which time she was doing well and compliant with CPAP therapy.   I reviewed her CPAP compliance data from 03/18/2017 through 04/16/2017 which is a total of 30 days, during which time she used her CPAP every night with percent used days greater than 4 hours at 100%, indicating superb compliance with an average usage of 8 hours and 35 minutes, residual AHI at goal at 0.2 per hour, leak acceptable with the 95th percentile at 10 L/m on a pressure of 9 cm with EPR of 3.    06/03/2016: (She) was previously diagnosed with obstructive sleep apnea and placed on CPAP therapy. She has a CPAP machine which she has been using, but some parts are defective, including the humidier and the power button and therefore, the machine does not always work. She needs an updated machine. Sleep study testing was in or around 2010. Prior sleep study results are not available for my review today. CPAP compliance download was reviewed from 01/24/2016 through 04/22/2016, which is a total of 90 days, during which  time she used her CPAP every night with percent used days greater than 4 hours at 100%, average usage of 9 hours and 5 minutes, residual AHI 1 per hour, pressure at 9 cm with EPR. I reviewed your office note from 05/01/2016, which you kindly included. Her DME company is [facility]. Her Epworth sleepiness score is 9 out of 24 today, fatigue score is 51 out of 63. Her bedtime is around MN to 1 AM and she has been taking ambien nearly nightly. She reports recent increase in stress in the past few months. She has had weight gain as well. Her wakeup time is usually between 9:30 and 10. She has nocturia once per average night. She has occasional  morning headaches. For her migraines she [facility] and gets Botox injections. She reports a possible family history of obstructive sleep apnea in her father. She denies restless leg symptoms. She is a nonsmoker and drinks alcohol very occasionally, once every week or every 2 weeks. She does not drink caffeine daily. She does not work. She home schooled her 3 children. She lives at home with her husband and currently her middle daughter is back home. She has an older son and a younger son as well. She does not watch TV in her bedroom. They have a dog, not in their bedroom at night.    Her Past Medical History Is Significant For: Past Medical History:  Diagnosis Date   Back pain of lumbar region with sciatica    Cancer (HCC)    breast   Diabetes mellitus, type II (HCC)    Family history of anesthesia complication    grandfather died under anesthesia 70's- had heart issues   H/O carpal tunnel repair 2014   Headache(784.0)    Hyperlipidemia    Hypertension    Hypothyroidism    Medial meniscus tear    PONV (postoperative nausea and vomiting)    Sleep apnea    cpap since 10    Her Past Surgical History Is Significant For: Past Surgical History:  Procedure Laterality Date   BILATERAL TOTAL MASTECTOMY WITH AXILLARY LYMPH NODE  DISSECTION     BREAST RECONSTRUCTION     CERVICAL DISC ARTHROPLASTY N/A 03/19/2013   Procedure: CERVICAL FIVE TO SIX, CERVICAL SIX TO SEVEN CERVICAL ANTERIOR DISC ARTHROPLASTY;  Surgeon: Barnett Abu, MD;  Location: [facility];  Service: Neurosurgery;  Laterality: N/A;  C56 C67 artificial disc replacement   CESAREAN SECTION     COLONOSCOPY     DILATATION & CURETTAGE/HYSTEROSCOPY WITH TRUECLEAR N/A 11/25/2013   Procedure: DILATATION & CURETTAGE/HYSTEROSCOPY WITH TRUCLEAR;  Surgeon: Meriel Pica, MD;  Location: [facility];  Service: Gynecology;  Laterality: N/A;   ECTOPIC PREGNANCY SURGERY     GALLBLADDER SURGERY     MOUTH SURGERY     child- dog bite   SPINAL FUSION     2023   TONSILLECTOMY     TUBAL LIGATION      Her Family History Is Significant For: Family History  Problem Relation Age of Onset   Hyperlipidemia Mother    Heart disease Father    Hypertension Father    Diabetes Father    Cancer Maternal Aunt        Breast - In 51's   Cancer Paternal Aunt        Breast - in 69's   Cancer Maternal Grandmother        breast cancer   Diabetes Paternal Grandmother    Heart disease Paternal Grandfather    Hypertension Paternal Grandfather    Suicidality Other    Sleep apnea Neg Hx     Her Social History Is Significant For: Social History   Socioeconomic History   Marital status: Married    Spouse name: Rosanne Ashing   Number of children: 3   Years of education: college   Highest education level: Not on file  Occupational History   Occupation: N/A  Tobacco Use   Smoking status: Never   Smokeless tobacco: Never   Tobacco comments:    occ alcohol  Vaping Use   Vaping Use: Never used  Substance and Sexual Activity   Alcohol use: Yes    Comment: occ  Drug use: No   Sexual activity: Not Currently  Other Topics Concern   Not on file  Social History Narrative   Denies caffeine use    Social Determinants of Health   Financial Resource Strain: Not on file  Food  Insecurity: No Food Insecurity (12/28/2021)   Hunger Vital Sign    Worried About Running Out of Food in the Last Year: Never true    Ran Out of Food in the Last Year: Never true  Transportation Needs: No Transportation Needs (12/28/2021)   PRAPARE - Administrator, Civil Service (Medical): No    Lack of Transportation (Non-Medical): No  Physical Activity: Not on file  Stress: Not on file  Social Connections: Not on file    Her Allergies Are:  Allergies  Allergen Reactions   Cyclobenzaprine Hives and Other (See Comments)   Effexor [Venlafaxine Hcl]     Weight loss and cant sleep   Linaclotide Other (See Comments)   Venlafaxine Other (See Comments)    Other Reaction(s): anxiety/ extreme weight loss  :   Her Current Medications Are:  Outpatient Encounter Medications as of 06/25/2022  Medication Sig   [START ON 07/17/2022] ALPRAZolam (XANAX) 0.5 MG tablet Take 1/2-1 tablet three times daily as needed for anxiety or insomnia   aspirin EC 81 MG tablet Take 1 tablet (81 mg total) by mouth daily.   atorvastatin (LIPITOR) 20 MG tablet Take 1 tablet (20 mg total) by mouth daily.   doxazosin (CARDURA) 4 MG tablet Take 1 tablet (4 mg total) by mouth at bedtime.   famotidine-calcium carbonate-magnesium hydroxide (PEPCID COMPLETE) 10-800-165 MG chewable tablet Chew 1 tablet by mouth daily as needed.   gabapentin (NEURONTIN) 100 MG capsule Take 1 capsule (100 mg total) by mouth 2 (two) times daily as needed (anxiety).   gabapentin (NEURONTIN) 300 MG capsule Take 1 capsule (300 mg total) by mouth at bedtime.   Galcanezumab-gnlm 120 MG/ML SOAJ Inject 120 mg into the skin every 30 (thirty) days.   hydrochlorothiazide (MICROZIDE) 12.5 MG capsule Take 12.5 mg by mouth daily.   ibuprofen (ADVIL) 200 MG tablet Take 1 tablet (200 mg total) by mouth every 6 (six) hours as needed.   JARDIANCE 10 MG TABS tablet Take 10 mg by mouth every other day.   Lemborexant (DAYVIGO) 10 MG TABS Take 1 tablet  (10 mg total) by mouth at bedtime.   metFORMIN (GLUCOPHAGE) 500 MG tablet Take 500 mg by mouth daily with supper.   metoprolol succinate (TOPROL-XL) 100 MG 24 hr tablet 1 tablet Orally Once a day for 90 days   naproxen sodium (ALEVE) 220 MG tablet Take 220 mg by mouth.   nitroGLYCERIN (NITROSTAT) 0.4 MG SL tablet Place 1 tablet (0.4 mg total) under the tongue every 5 (five) minutes as needed for chest pain.   OnabotulinumtoxinA (BOTOX IM) Inject into the muscle every 3 (three) months. FOR MIGRAINES   rizatriptan (MAXALT-MLT) 10 MG disintegrating tablet Take 10 mg by mouth as needed for migraine.    Tetrahydrozoline HCl (VISINE OP) Place 1 drop into both eyes daily as needed (irritation).   thyroid (ARMOUR) 30 MG tablet Take 30 mg by mouth daily before breakfast.    UBRELVY 50 MG TABS Take 50 mg by mouth daily as needed (migraine).   Vilazodone HCl 20 MG TABS Take 1 tablet (20 mg total) by mouth daily with breakfast.   Facility-Administered Encounter Medications as of 06/25/2022  Medication   ondansetron (ZOFRAN) 4 mg in  sodium chloride 0.9 % 50 mL IVPB  :  Review of Systems:  Out of a complete 14 point review of systems, all are reviewed and negative with the exception of these symptoms as listed below:    Review of Systems  Neurological:        Pt here for CPAP f/u Pt states Pt states her hair is thinning  on top of head Pt states is that due to  head gear . Pt wants to discuss new CPAP machine set up date 08/07/2016   ESS:6     Objective:  Neurological Exam  Physical Exam Physical Examination:   Vitals:   06/25/22 1349  BP: 120/72  Pulse: 73    General Examination: The patient is a very pleasant 62 y.o. female in no acute distress. She appears well-developed and well-nourished and well groomed.   HEENT: Normocephalic, atraumatic, pupils are equal, round and reactive to light, tracking is good, hearing is grossly intact. Face is symmetric with normal facial animation. Speech  is clear with no dysarthria noted. There is no hypophonia. There is no lip, neck/head, jaw or voice tremor. Neck is supple with FROM. Oropharynx exam reveals: no new changes.  Mild hair thinning noted top of her head.   Chest: Clear to auscultation without wheezing, rhonchi or crackles noted.   Heart: S1+S2+0, regular and normal without murmurs, rubs or gallops noted.    Abdomen: Soft, non-tender and non-distended.   Extremities: There is no obv. edema in the distal lower extremities bilaterally.    Skin: Warm and dry without trophic changes noted.   Musculoskeletal: exam reveals limited range of motion right knee.      Neurologically:  Mental status: The patient is awake, alert and oriented in all 4 spheres. Her immediate and remote memory, attention, language skills and fund of knowledge are appropriate. There is no evidence of aphasia, agnosia, apraxia or anomia. Speech is clear with normal prosody and enunciation. Thought process is linear. Mood is normal and affect is normal.  Cranial nerves II - XII are as described above under HEENT exam. In addition: shoulder shrug is normal with equal shoulder height noted. Motor exam: Normal bulk, strength and tone is noted. There is no obvious resting or action tremor. Fine motor skills and coordination: grossly intact.  Cerebellar testing: No dysmetria or intention tremor. There is no truncal or gait ataxia.  Sensory exam: intact to light touch.  Gait, station and balance: She stands easily. No veering to one side is noted. No leaning to one side is noted. Posture is age-appropriate and stance is narrow based. Gait shows normal stride length and normal pace. No problems turning are noted.     Assessment and Plan:  In summary, JERALDENE HAMES is a very pleasant 62 year old right-handed woman with an underlying medical history of hypertension, hypothyroidism, migraine headaches, allergic rhinitis, plantar fasciitis, cervical disc disease with  myelopathy, status post neck surgery on 03/19/2013, status post low back surgery in December 2033, hyperlipidemia, obesity, and DCIS with s/p b/l mastectomies, who presents for follow-up consultation of her obstructive sleep apnea, well-established on CPAP therapy.  She has had some trouble with her current nasal cushion interface as the tubing on the top of the head has led to some hair thinning.  She is advised to try the P 10 nasal pillows interface.  It does not have a strap on the top of the head.  She was given a sample mask today.  She is eligible for  new equipment, her set up date was July 2018.  We will proceed with a home sleep test and plan to see her back after she is been issued a new machine.  She is commended for her treatment adherence.  She has continued to do well with her CPAP with good apnea control on a pressure of 9 cm.  She is scheduled for right total knee replacement in September of this year with Dr. Despina Hick.  I answered all her questions today and she was in agreement. I spent 30 minutes in total face-to-face time and in reviewing records during pre-charting, more than 50% of which was spent in counseling and coordination of care, reviewing test results, reviewing medications and treatment regimen and/or in discussing or reviewing the diagnosis of OSA, the prognosis and treatment options. Pertinent laboratory and imaging test results that were available during this visit with the patient were reviewed by me and considered in my medical decision making (see chart for details).

## 2022-06-25 NOTE — Patient Instructions (Signed)
It was nice to see you again today.   As discussed, we will proceed with a home sleep test (HST) to re-establish your sleep apnea diagnosis and to get you a new machine. Our sleep lab staff will reach out to you to arrange for pickup and for tutorial of your test equipment - you will do the test at home that night and bring the test sensors back for data analysis the next day or whenever you are scheduled for drop off of your test equipment. I will write for a new machine after your HST confirms your obstructive sleep apnea diagnosis.   Please remember, you will not use your CPAP the night of your testing.  This is so we get diagnostic data, we do not need treatment data.    Most people who are very consistent with their AutoPap or CPAP use do not sleep very well without the machine - this is understandable, but as long as we have just sleep data that confirms your sleep apnea diagnosis we should be good.   We will schedule a follow-up appointment after set up with your new machine, typically within 31 to 89 days post treatment start. You will need to show compliance with usage and fulfill a minimum usage percentage (this is an insurance requirement).  After you have done your home sleep test, you can resume using your current machine until you get a new one.   Please continue using your CPAP regularly. While your insurance requires that you use CPAP at least 4 hours each night on 70% of the nights, I recommend, that you not skip any nights and use it throughout the night if you can. Getting used to CPAP and staying with the treatment long term does take time and patience and discipline. Untreated obstructive sleep apnea when it is moderate to severe can have an adverse impact on cardiovascular health and raise her risk for heart disease, arrhythmias, hypertension, congestive heart failure, stroke and diabetes. Untreated obstructive sleep apnea causes sleep disruption, nonrestorative sleep, and sleep  deprivation. This can have an impact on your day to day functioning and cause daytime sleepiness and impairment of cognitive function, memory loss, mood disturbance, and problems focussing. Using CPAP regularly can improve these symptoms.  Please try the P10 nasal pillows mask I provided as a sample today. Please feel free to give [place] feedback about the mask. Hopefully it will not affect your hair growth.

## 2022-06-25 NOTE — Therapy (Signed)
OUTPATIENT PHYSICAL THERAPY TREATMENT NOTE   Patient Name: [name] MRN: [id number] DOB:14-Aug-1960, 62 y.o., female Today's Date: 06/26/2022  PCP: Creola Corn, MD  REFERRING PROVIDER:  Marguerita Beards, MD   END OF SESSION:   PT End of Session - 06/25/22 1150     Visit Number 8    Date for PT Re-Evaluation 07/16/22    Authorization Type [employer]    PT Start Time 1148   stuck in traffic   PT Stop Time 1226    PT Time Calculation (min) 38 min    Activity Tolerance Patient tolerated treatment well    Behavior During Therapy WFL for tasks assessed/performed             Past Medical History:  Diagnosis Date   Back pain of lumbar region with sciatica    Cancer (HCC)    breast   Diabetes mellitus, type II (HCC)    Family history of anesthesia complication    grandfather died under anesthesia 70's- had heart issues   H/O carpal tunnel repair 2014   Headache(784.0)    Hyperlipidemia    Hypertension    Hypothyroidism    Medial meniscus tear    PONV (postoperative nausea and vomiting)    Sleep apnea    cpap since 10   Past Surgical History:  Procedure Laterality Date   BILATERAL TOTAL MASTECTOMY WITH AXILLARY LYMPH NODE DISSECTION     BREAST RECONSTRUCTION     CERVICAL DISC ARTHROPLASTY N/A 03/19/2013   Procedure: CERVICAL FIVE TO SIX, CERVICAL SIX TO SEVEN CERVICAL ANTERIOR DISC ARTHROPLASTY;  Surgeon: Barnett Abu, MD;  Location: [facility];  Service: Neurosurgery;  Laterality: N/A;  C56 C67 artificial disc replacement   CESAREAN SECTION     COLONOSCOPY     DILATATION & CURETTAGE/HYSTEROSCOPY WITH TRUECLEAR N/A 11/25/2013   Procedure: DILATATION & CURETTAGE/HYSTEROSCOPY WITH TRUCLEAR;  Surgeon: Meriel Pica, MD;  Location: [facility];  Service: Gynecology;  Laterality: N/A;   ECTOPIC PREGNANCY SURGERY     GALLBLADDER SURGERY     MOUTH SURGERY     child- dog bite   SPINAL FUSION     2023   TONSILLECTOMY     TUBAL LIGATION     Patient Active Problem  List   Diagnosis Date Noted   Exertional dyspnea 04/17/2022   Elevated coronary artery calcium score 04/17/2022   Spondylolisthesis at L4-L5 level 12/27/2021   Ductal carcinoma in situ (DCIS) of left breast 01/19/2018   Generalized anxiety disorder 11/07/2017   Depression 11/07/2017   Insomnia 11/07/2017   Cervical spondylosis 03/19/2013   REFERRING DIAG: Y78.295 (ICD-10-CM) - Levator spasm    THERAPY DIAG:  M62.81 Muscle weakness (generalized) N39.41 Urgency Incontinence   Rationale for Evaluation and Treatment: Rehabilitation   ONSET DATE: 2021   SUBJECTIVE:  SUBJECTIVE STATEMENT: I had one accident this week.  I was done urinating and went to stand up then leaked urine. This was in the morning.   PAIN:  Are you having pain? Yes NPRS scale: 6/10 Pain location:  back   Pain type: pins and needles Pain description: constant    Aggravating factors: moving, sitting  Relieving factors: rest   PRECAUTIONS: Other: recent lumbar fusion 12/23   WEIGHT BEARING RESTRICTIONS: No   FALLS:  Has patient fallen in last 6 months? No   LIVING ENVIRONMENT: Lives with: lives with their daughter   OCCUPATION: recovering from back surgery   PLOF: Independent   PATIENT GOALS: reduce urinary leakage, not have to clean up bathroom in middle of night due to accident   PERTINENT HISTORY:  Breast cancer; DM; Hypothyroidism; sleep apnea; bilateral total mastectomy; c-section; cervical disc arthroplasty; Gallbladder removal; L3-L5 spinal fusion 01/16/22 Sexual abuse: No   BOWEL MOVEMENT: Pain with bowel movement: No Type of bowel movement:Type (Bristol Stool Scale) varies, Frequency every 2-3 days, Strain Yes, and Splinting yes Fully empty rectum: No due to back surgery  Leakage: No Fiber supplement:  Yes: stool softner   URINATION: Pain with urination: No Fully empty bladder: No, Has to lean forward or backward to empty completely ; When urinating, she feels difficulty starting urine stream, dribbling after finishing, and the need to urinate multiple times in a row  Stream: Strong Urgency: Yes:   patient does not wake up in time to go to the bathroom and still recovering from back surger.  Frequency: Day time voids 6.  Nocturia: 1-2 times per night to void.  Leakage: Walking to the bathroom, Coughing, Sneezing, Exercise, Intercourse, and going from sitting to standing, with a full bladder, without sensation ; patient gets the urge to urinate in standing and not sitting.  Pads: No   INTERCOURSE: not sexually active   PREGNANCY: C-section deliveries 3     OBJECTIVE:    DIAGNOSTIC FINDINGS:  Pelvic floor strength I/V   PVR of 22 ml was obtained by bladder scan      COGNITION: Overall cognitive status: Within functional limits for tasks assessed                          SENSATION: Light touch: Appears intact Proprioception: Appears intact     GAIT: Distance walked: 200 feet Assistive device utilized: Single point cane Level of assistance: Complete Independence Comments: recovering from back fusion   POSTURE: rounded shoulders, forward head, and decreased lumbar lordosis   PELVIC ALIGNMENT:   LUMBARAROM/PROM: not tested due to recent back surgery     LOWER EXTREMITY ROM:   Passive ROM Right eval Left eval  Hip flexion 115 110  Hip internal rotation -5 0  Hip external rotation 70 70   (Blank rows = not tested)         PALPATION:   General  unable to tighten her lower abdominals, tenderness located in the lower abdominal, restrictions of the c-section scar                 External Perineal Exam vaginal dryness                             Internal Pelvic Floor tenderness located on bilateral levator ani, obturator internist, sides of the urethra and  bladder   Patient confirms identification and approves PT to assess  internal pelvic floor and treatment Yes   PELVIC MMT:   MMT eval 05/16/22 05/30/22 06/11/22  Vaginal 3/5 anterior and posterior 2/5 laterally with right side weaker 3/5 with circular contraction 4/5 with good lift and circular contraction 4/5 in supine  (Blank rows = not tested)         TONE: increased     TODAY'S TREATMENT:   06/27/22 Manual: Internal pelvic floor techniques: No emotional/communication barriers or cognitive limitation. Patient is motivated to learn. Patient understands and agrees with treatment goals and plan. PT explains patient will be examined in standing, sitting, and lying down to see how their muscles and joints work. When they are ready, they will be asked to remove their underwear so PT can examine their perineum. The patient is also given the option of providing their own chaperone as one is not provided in our facility. The patient also has the right and is explained the right to defer or refuse any part of the evaluation or treatment including the internal exam. With the patient's consent, PT will use one gloved finger to gently assess the muscles of the pelvic floor, seeing how well it contracts and relaxes and if there is muscle symmetry. After, the patient will get dressed and PT and patient will discuss exam findings and plan of care. PT and patient discuss plan of care, schedule, attendance policy and HEP activities.  Going through vaginally working on the introitus, levator ani, obturator internist, release around the urethra, along the bladder   06/18/22 Exercises: Strengthening: Knee fallout 10 x each side Marching 10 x each side Abdominal isometrics with hands pressing on the thighs Bilateral shoulder extension in hookly 20 x  Hookly shoulder flexion alternating with ball squeeze 20 x ; tried some resistance but had a little back pain so stopped Bridges 3 times but had some back pain     06/11/22 Manual: Internal pelvic floor techniques: No emotional/communication barriers or cognitive limitation. Patient is motivated to learn. Patient understands and agrees with treatment goals and plan. PT explains patient will be examined in standing, sitting, and lying down to see how their muscles and joints work. When they are ready, they will be asked to remove their underwear so PT can examine their perineum. The patient is also given the option of providing their own chaperone as one is not provided in our facility. The patient also has the right and is explained the right to defer or refuse any part of the evaluation or treatment including the internal exam. With the patient's consent, PT will use one gloved finger to gently assess the muscles of the pelvic floor, seeing how well it contracts and relaxes and if there is muscle symmetry. After, the patient will get dressed and PT and patient will discuss exam findings and plan of care. PT and patient discuss plan of care, schedule, attendance policy and HEP activities.  Going through the vagina working on the perineal body, superior transverse, levator ani and obturator internist.  Neuromuscular re-education: Pelvic floor contraction training: Therapist finger in the  vaginal canal working on  pelvic floor contraction and abdominal contraction  Knee fallout 10 x each side Marching 10 x each side Abdominal isometrics with hands pressing on the thighs        PATIENT EDUCATION: 06/11/22 Education details: Access Code: [email or web address] Person educated: Patient Education method: Programmer, multimedia, Demonstration, Tactile cues, Verbal cues, and Handouts Education comprehension: verbalized understanding, returned demonstration, verbal cues required, tactile cues required, and needs further  education     HOME EXERCISE PROGRAM: 06/11/22 Access Code: [email or web address] URL: [email or web address] Date: 06/11/2022 Prepared by: Eulis Foster  Exercises - Supine Diaphragmatic Breathing  - 3 x daily - 7 x weekly - 1 sets - 10 reps - Supine March with Posterior Pelvic Tilt  - 1 x daily - 4 x weekly - 1 sets - 10 reps - Bent Knee Fallouts  - 1 x daily - 4 x weekly - 1 sets - 10 reps - Supine Transversus Abdominis Bracing - Hands on Thighs  - 1 x daily - 7 x weekly - 3 sets - 10 reps     ASSESSMENT:   CLINICAL IMPRESSION: Patient is a 62 y.o. female who was seen today for physical therapy  treatment for levator ani spasm.  Patient has a lot of fascial restrictions along the urethra and bladder. She had one accident of urinary leakage. Patient is working on her core strength.  Patient will benefit from skilled therapy to elongate her pelvic floor so she will be able to coordinate the muscles to reduce her urinary leakage.    OBJECTIVE IMPAIRMENTS: decreased endurance, decreased ROM, decreased strength, increased fascial restrictions, and increased muscle spasms.    ACTIVITY LIMITATIONS: sitting, standing, transfers, continence, and locomotion level   PARTICIPATION LIMITATIONS: meal prep, cleaning, laundry, and community activity   PERSONAL FACTORS: Age, Past/current experiences, Time since onset of injury/illness/exacerbation, and 3+ comorbidities: Breast cancer; DM; Hypothyroidism; sleep apnea; bilateral total mastectomy; c-section; cervical disc arthroplasty; Gallbladder removal; L3-L5 spinal fusion 01/16/22  are also affecting patient's functional outcome.    REHAB POTENTIAL: Good   CLINICAL DECISION MAKING: Evolving/moderate complexity   EVALUATION COMPLEXITY: Moderate     GOALS: Goals reviewed with patient? Yes   SHORT TERM GOALS: Target date: 05/20/22   Patient is independent with initial HEP  for c-section scar massage to improve tissue mobility.  Baseline: Goal status: Met 05/09/22   2.  Patient is independent with initial HEP for hip mobility and pelvic floor drop to lengthen the muscles.  Baseline:  Goal  status: Met 06/18/22   3.  Patient able to perform diaphragmatic breathing to relax her pelvic floor.  Baseline:  Goal status: Met 05/21/22     LONG TERM GOALS: Target date: 07/16/22   Patient independent with advanced HEP for pelvic floor strengthening and elongation to reduce her urinary leakage.  Baseline:  Goal status: Ongoing 06/18/22   2.  Pelvic floor strength is >/= 3/5 with circular hug of therapist finger to reduce her urinary leakage.  Baseline:  Goal status: Met 5.9.24   3.  Patient is able to wake up in the middle of the night and walk to the bathroom without leaking urine due to improve pelvic floor coordination.  Baseline:  Goal status: Met 06/11/22   4.   Urinary leakage with coughing, sneezing and laughing is reduced >/= 75% due to improved pelvic floor strength and coordination.  Baseline:  Goal status: ongoing 06/18/22     5.  Bilateral hip flexion P/ROM is >/= 115 degrees so she is able to sit on the commode and fully empty her bladder.  Baseline:  Goal status: INITIAL     PLAN:   PT FREQUENCY: 1-2x/week   PT DURATION: 12 weeks   PLANNED INTERVENTIONS: Therapeutic exercises, Therapeutic activity, Neuromuscular re-education, Patient/Family education, Joint mobilization, Aquatic Therapy, Dry Needling, Electrical stimulation, Cryotherapy, Moist heat, scar mobilization, Taping, Ultrasound, Biofeedback, and Manual therapy   PLAN FOR NEXT SESSION:  internal  work, hip stretches, abdominal exercises low level, check hip flexion PROM  Eulis Foster, PT 06/26/22 7:59 AM

## 2022-06-27 ENCOUNTER — Encounter: Payer: No Typology Code available for payment source | Admitting: Physical Therapy

## 2022-07-02 ENCOUNTER — Encounter: Payer: No Typology Code available for payment source | Admitting: Physical Therapy

## 2022-07-02 ENCOUNTER — Encounter: Payer: Self-pay | Admitting: Physical Therapy

## 2022-07-02 DIAGNOSIS — M6281 Muscle weakness (generalized): Secondary | ICD-10-CM

## 2022-07-02 DIAGNOSIS — N3941 Urge incontinence: Secondary | ICD-10-CM

## 2022-07-02 NOTE — Therapy (Signed)
OUTPATIENT PHYSICAL THERAPY TREATMENT NOTE   Patient Name: [name] MRN: [id number] DOB:1960/11/17, 62 y.o., female Today's Date: 07/02/2022  PCP: Creola Corn, MD  REFERRING PROVIDER:  Marguerita Beards, MD   END OF SESSION:   PT End of Session - 07/02/22 1040     Visit Number 9    Date for PT Re-Evaluation 07/16/22    Authorization Type [employer]    PT Start Time 1038    PT Stop Time 1120    PT Time Calculation (min) 42 min    Activity Tolerance Patient tolerated treatment well    Behavior During Therapy WFL for tasks assessed/performed             Past Medical History:  Diagnosis Date   Back pain of lumbar region with sciatica    Cancer (HCC)    breast   Diabetes mellitus, type II (HCC)    Family history of anesthesia complication    grandfather died under anesthesia 70's- had heart issues   H/O carpal tunnel repair 2014   Headache(784.0)    Hyperlipidemia    Hypertension    Hypothyroidism    Medial meniscus tear    PONV (postoperative nausea and vomiting)    Sleep apnea    cpap since 10   Past Surgical History:  Procedure Laterality Date   BILATERAL TOTAL MASTECTOMY WITH AXILLARY LYMPH NODE DISSECTION     BREAST RECONSTRUCTION     CERVICAL DISC ARTHROPLASTY N/A 03/19/2013   Procedure: CERVICAL FIVE TO SIX, CERVICAL SIX TO SEVEN CERVICAL ANTERIOR DISC ARTHROPLASTY;  Surgeon: Barnett Abu, MD;  Location: [facility];  Service: Neurosurgery;  Laterality: N/A;  C56 C67 artificial disc replacement   CESAREAN SECTION     COLONOSCOPY     DILATATION & CURETTAGE/HYSTEROSCOPY WITH TRUECLEAR N/A 11/25/2013   Procedure: DILATATION & CURETTAGE/HYSTEROSCOPY WITH TRUCLEAR;  Surgeon: Meriel Pica, MD;  Location: [facility];  Service: Gynecology;  Laterality: N/A;   ECTOPIC PREGNANCY SURGERY     GALLBLADDER SURGERY     MOUTH SURGERY     child- dog bite   SPINAL FUSION     2023   TONSILLECTOMY     TUBAL LIGATION     Patient Active Problem List   Diagnosis  Date Noted   Exertional dyspnea 04/17/2022   Elevated coronary artery calcium score 04/17/2022   Spondylolisthesis at L4-L5 level 12/27/2021   Ductal carcinoma in situ (DCIS) of left breast 01/19/2018   Generalized anxiety disorder 11/07/2017   Depression 11/07/2017   Insomnia 11/07/2017   Cervical spondylosis 03/19/2013   REFERRING DIAG: Y60.630 (ICD-10-CM) - Levator spasm    THERAPY DIAG:  M62.81 Muscle weakness (generalized) N39.41 Urgency Incontinence   Rationale for Evaluation and Treatment: Rehabilitation   ONSET DATE: 2021   SUBJECTIVE:  SUBJECTIVE STATEMENT: I had one accident in the middle of the night getting to the bathroom. Smaller leak and was able to stop it to prevent a larger leak.   PAIN:  Are you having pain? Yes NPRS scale: 6/10 Pain location:  back   Pain type: pins and needles Pain description: constant    Aggravating factors: moving, sitting  Relieving factors: rest   PRECAUTIONS: Other: recent lumbar fusion 12/23   WEIGHT BEARING RESTRICTIONS: No   FALLS:  Has patient fallen in last 6 months? No   LIVING ENVIRONMENT: Lives with: lives with their daughter   OCCUPATION: recovering from back surgery   PLOF: Independent   PATIENT GOALS: reduce urinary leakage, not have to clean up bathroom in middle of night due to accident   PERTINENT HISTORY:  Breast cancer; DM; Hypothyroidism; sleep apnea; bilateral total mastectomy; c-section; cervical disc arthroplasty; Gallbladder removal; L3-L5 spinal fusion 01/16/22 Sexual abuse: No   BOWEL MOVEMENT: Pain with bowel movement: No Type of bowel movement:Type (Bristol Stool Scale) varies, Frequency every 2-3 days, Strain Yes, and Splinting yes Fully empty rectum: No due to back surgery  Leakage: No Fiber supplement:  Yes: stool softner   URINATION: Pain with urination: No Fully empty bladder: No, Has to lean forward or backward to empty completely ;  dribbling after finishing, and the need to urinate multiple times in a row  Stream: Strong Urgency: Yes:   patient does not wake up in time to go to the bathroom and still recovering from back surger.  Frequency: Day time voids 6.  Nocturia: 1-2 times per night to void.  Leakage: Walking to the bathroom, Coughing, Sneezing, Exercise, Intercourse, and going from sitting to standing, with a full bladder, without sensation ; patient gets the urge to urinate in standing and not sitting.  Pads: No   INTERCOURSE: not sexually active   PREGNANCY: C-section deliveries 3     OBJECTIVE:    DIAGNOSTIC FINDINGS:  Pelvic floor strength I/V   PVR of 22 ml was obtained by bladder scan      COGNITION: Overall cognitive status: Within functional limits for tasks assessed                          SENSATION: Light touch: Appears intact Proprioception: Appears intact     GAIT: Distance walked: 200 feet Assistive device utilized: Single point cane Level of assistance: Complete Independence Comments: recovering from back fusion   POSTURE: rounded shoulders, forward head, and decreased lumbar lordosis   PELVIC ALIGNMENT:   LUMBARAROM/PROM: not tested due to recent back surgery     LOWER EXTREMITY ROM:   Passive ROM Right eval Left eval Right 07/02/22 Left  07/02/22  Hip flexion 115 110 115 115  Hip internal rotation -5 0 15 10  Hip external rotation 70 70 70 70   (Blank rows = not tested)         PALPATION:   General  unable to tighten her lower abdominals, tenderness located in the lower abdominal, restrictions of the c-section scar                 External Perineal Exam vaginal dryness                             Internal Pelvic Floor tenderness located on bilateral levator ani, obturator internist, sides of the urethra and bladder    Patient  confirms identification and approves PT to assess internal pelvic floor and treatment Yes   PELVIC MMT:   MMT eval 05/16/22 05/30/22 06/11/22  Vaginal 3/5 anterior and posterior 2/5 laterally with right side weaker 3/5 with circular contraction 4/5 with good lift and circular contraction 4/5 in supine  (Blank rows = not tested)         TONE: increased     TODAY'S TREATMENT:   07/02/22 Exercises: Strengthening: Marching 10 x each side with abdominal contraction Dead bug with tactile cues for abdominal contraction prior to lifting the extremities 20 x each side Knee fall out with red band 15 x each leg with abdominal and pelvic floor contraction Standing pressing hands into wall and perform shoulder flexion with core engaged 20 x Therapeutic activities: Functional strengthening activities: Educated patient on how the bladder fills, how much urine makes the bladder have the urge to urinate but more time to fill, how to wait every 2 hours to urinate  06/27/22 Manual: Internal pelvic floor techniques: No emotional/communication barriers or cognitive limitation. Patient is motivated to learn. Patient understands and agrees with treatment goals and plan. PT explains patient will be examined in standing, sitting, and lying down to see how their muscles and joints work. When they are ready, they will be asked to remove their underwear so PT can examine their perineum. The patient is also given the option of providing their own chaperone as one is not provided in our facility. The patient also has the right and is explained the right to defer or refuse any part of the evaluation or treatment including the internal exam. With the patient's consent, PT will use one gloved finger to gently assess the muscles of the pelvic floor, seeing how well it contracts and relaxes and if there is muscle symmetry. After, the patient will get dressed and PT and patient will discuss exam findings and plan of  care. PT and patient discuss plan of care, schedule, attendance policy and HEP activities.  Going through vaginally working on the introitus, levator ani, obturator internist, release around the urethra, along the bladder   06/18/22 Exercises: Strengthening: Knee fallout 10 x each side Marching 10 x each side Abdominal isometrics with hands pressing on the thighs Bilateral shoulder extension in hookly 20 x  Hookly shoulder flexion alternating with ball squeeze 20 x ; tried some resistance but had a little back pain so stopped Bridges 3 times but had some back pain        PATIENT EDUCATION: 07/02/22 Education details: Access Code: [email or web address] Person educated: Patient Education method: Explanation, Demonstration, Tactile cues, Verbal cues, and Handouts Education comprehension: verbalized understanding, returned demonstration, verbal cues required, tactile cues required, and needs further education     HOME EXERCISE PROGRAM: 07/02/22 Access Code: [facility] URL: [email or web address] Date: 07/02/2022 Prepared by: Eulis Foster  Exercises - Supine Diaphragmatic Breathing  - 3 x daily - 7 x weekly - 1 sets - 10 reps - Bent Knee Fallouts  - 1 x daily - 4 x weekly - 1 sets - 10 reps - Dead Bug  - 1 x daily - 4 x weekly - 2 sets - 10 reps - Standing Single Shoulder Flexion Wall Slide with Palm Up  - 1 x daily - 7 x weekly - 3 sets - 10 reps     ASSESSMENT:   CLINICAL IMPRESSION: Patient is a 62 y.o. female who was seen today for physical therapy  treatment for levator ani spasm.  Increased in  bilateral hip ROM. She has had one accident but was able to stop the urine from coming out so it was only a small leak instead of a large one. She is working on strengthening her core. She is going to the bathroom more frequently due to not wanting to have to rush to the bathroom. Patient was educated on how the bladder fills and the different levels of urine in the bladder causing urge to  void.  Patient will benefit from skilled therapy to elongate her pelvic floor so she will be able to coordinate the muscles to reduce her urinary leakage.    OBJECTIVE IMPAIRMENTS: decreased endurance, decreased ROM, decreased strength, increased fascial restrictions, and increased muscle spasms.    ACTIVITY LIMITATIONS: sitting, standing, transfers, continence, and locomotion level   PARTICIPATION LIMITATIONS: meal prep, cleaning, laundry, and community activity   PERSONAL FACTORS: Age, Past/current experiences, Time since onset of injury/illness/exacerbation, and 3+ comorbidities: Breast cancer; DM; Hypothyroidism; sleep apnea; bilateral total mastectomy; c-section; cervical disc arthroplasty; Gallbladder removal; L3-L5 spinal fusion 01/16/22  are also affecting patient's functional outcome.    REHAB POTENTIAL: Good   CLINICAL DECISION MAKING: Evolving/moderate complexity   EVALUATION COMPLEXITY: Moderate     GOALS: Goals reviewed with patient? Yes   SHORT TERM GOALS: Target date: 05/20/22   Patient is independent with initial HEP  for c-section scar massage to improve tissue mobility.  Baseline: Goal status: Met 05/09/22   2.  Patient is independent with initial HEP for hip mobility and pelvic floor drop to lengthen the muscles.  Baseline:  Goal status: Met 06/18/22   3.  Patient able to perform diaphragmatic breathing to relax her pelvic floor.  Baseline:  Goal status: Met 05/21/22     LONG TERM GOALS: Target date: 07/16/22   Patient independent with advanced HEP for pelvic floor strengthening and elongation to reduce her urinary leakage.  Baseline:  Goal status: Ongoing 06/18/22   2.  Pelvic floor strength is >/= 3/5 with circular hug of therapist finger to reduce her urinary leakage.  Baseline:  Goal status: Met 5.9.24   3.  Patient is able to wake up in the middle of the night and walk to the bathroom without leaking urine due to improve pelvic floor coordination.   Baseline:  Goal status: Met 06/11/22   4.   Urinary leakage with coughing, sneezing and laughing is reduced >/= 75% due to improved pelvic floor strength and coordination.  Baseline:  Goal status: ongoing 06/18/22     5.  Bilateral hip flexion P/ROM is >/= 115 degrees so she is able to sit on the commode and fully empty her bladder.  Baseline: Hip flexion is 115 degrees Goal status: ongoing 07/02/22     PLAN:   PT FREQUENCY: 1-2x/week   PT DURATION: 12 weeks   PLANNED INTERVENTIONS: Therapeutic exercises, Therapeutic activity, Neuromuscular re-education, Patient/Family education, Joint mobilization, Aquatic Therapy, Dry Needling, Electrical stimulation, Cryotherapy, Moist heat, scar mobilization, Taping, Ultrasound, Biofeedback, and Manual therapy   PLAN FOR NEXT SESSION:  internal work possible,  abdominal exercises low level  Eulis Foster, PT 07/02/22 11:30 AM

## 2022-07-04 ENCOUNTER — Encounter: Payer: No Typology Code available for payment source | Admitting: Physical Therapy

## 2022-07-09 ENCOUNTER — Encounter: Payer: No Typology Code available for payment source | Admitting: Physical Therapy

## 2022-07-11 ENCOUNTER — Encounter: Payer: No Typology Code available for payment source | Admitting: Physical Therapy

## 2022-07-11 ENCOUNTER — Encounter: Payer: Self-pay | Admitting: Physical Therapy

## 2022-07-11 DIAGNOSIS — M6281 Muscle weakness (generalized): Secondary | ICD-10-CM | POA: Diagnosis not present

## 2022-07-11 DIAGNOSIS — N3941 Urge incontinence: Secondary | ICD-10-CM

## 2022-07-11 NOTE — Therapy (Signed)
OUTPATIENT PHYSICAL THERAPY TREATMENT NOTE   Patient Name: [name] MRN: [id number] DOB:Nov 24, 1960, 62 y.o., female Today's Date: 07/11/2022  PCP: Creola Corn, MD  REFERRING PROVIDER:  Marguerita Beards, MD   END OF SESSION:   PT End of Session - 07/11/22 1139     Visit Number 10    Date for PT Re-Evaluation 07/16/22    Authorization Type [employer]    PT Start Time 1135    PT Stop Time 1220    PT Time Calculation (min) 45 min    Activity Tolerance Patient tolerated treatment well    Behavior During Therapy WFL for tasks assessed/performed             Past Medical History:  Diagnosis Date   Back pain of lumbar region with sciatica    Cancer (HCC)    breast   Diabetes mellitus, type II (HCC)    Family history of anesthesia complication    grandfather died under anesthesia 70's- had heart issues   H/O carpal tunnel repair 2014   Headache(784.0)    Hyperlipidemia    Hypertension    Hypothyroidism    Medial meniscus tear    PONV (postoperative nausea and vomiting)    Sleep apnea    cpap since 10   Past Surgical History:  Procedure Laterality Date   BILATERAL TOTAL MASTECTOMY WITH AXILLARY LYMPH NODE DISSECTION     BREAST RECONSTRUCTION     CERVICAL DISC ARTHROPLASTY N/A 03/19/2013   Procedure: CERVICAL FIVE TO SIX, CERVICAL SIX TO SEVEN CERVICAL ANTERIOR DISC ARTHROPLASTY;  Surgeon: Barnett Abu, MD;  Location: [facility];  Service: Neurosurgery;  Laterality: N/A;  C56 C67 artificial disc replacement   CESAREAN SECTION     COLONOSCOPY     DILATATION & CURETTAGE/HYSTEROSCOPY WITH TRUECLEAR N/A 11/25/2013   Procedure: DILATATION & CURETTAGE/HYSTEROSCOPY WITH TRUCLEAR;  Surgeon: Meriel Pica, MD;  Location: [facility];  Service: Gynecology;  Laterality: N/A;   ECTOPIC PREGNANCY SURGERY     GALLBLADDER SURGERY     MOUTH SURGERY     child- dog bite   SPINAL FUSION     2023   TONSILLECTOMY     TUBAL LIGATION     Patient Active Problem List   Diagnosis  Date Noted   Exertional dyspnea 04/17/2022   Elevated coronary artery calcium score 04/17/2022   Spondylolisthesis at L4-L5 level 12/27/2021   Ductal carcinoma in situ (DCIS) of left breast 01/19/2018   Generalized anxiety disorder 11/07/2017   Depression 11/07/2017   Insomnia 11/07/2017   Cervical spondylosis 03/19/2013   REFERRING DIAG: W09.811 (ICD-10-CM) - Levator spasm    THERAPY DIAG:  M62.81 Muscle weakness (generalized) N39.41 Urgency Incontinence   Rationale for Evaluation and Treatment: Rehabilitation   ONSET DATE: 2021   SUBJECTIVE:  SUBJECTIVE STATEMENT: I have not had an urinary leakage since last visit. Hard to stand on right leg due to pain. Personal goal for me is to walk to the bathroom with walker and slowly sit completely on the toilet an let urine out instead of as she is sitting down the urine comes out.   PAIN:  Are you having pain? Yes NPRS scale: 5/10 Pain location:  back   Pain type: pins and needles Pain description: constant    Aggravating factors: moving, sitting  Relieving factors: rest   PRECAUTIONS: Other: recent lumbar fusion 12/23   WEIGHT BEARING RESTRICTIONS: No   FALLS:  Has patient fallen in last 6 months? No   LIVING ENVIRONMENT: Lives with: lives with their daughter   OCCUPATION: recovering from back surgery   PLOF: Independent   PATIENT GOALS: reduce urinary leakage, not have to clean up bathroom in middle of night due to accident   PERTINENT HISTORY:  Breast cancer; DM; Hypothyroidism; sleep apnea; bilateral total mastectomy; c-section; cervical disc arthroplasty; Gallbladder removal; L3-L5 spinal fusion 01/16/22 Sexual abuse: No   BOWEL MOVEMENT: Pain with bowel movement: No Type of bowel movement:Type (Bristol Stool Scale) varies,  Frequency every 2-3 days, Strain Yes, and Splinting yes Fully empty rectum: No due to back surgery  Leakage: No Fiber supplement: Yes: stool softner   URINATION: Pain with urination: No Fully empty bladder: No, Has to lean forward or backward to empty completely ;  dribbling after finishing, and the need to urinate multiple times in a row  Stream: Strong Urgency: Yes:   patient does not wake up in time to go to the bathroom and still recovering from back surger.  Frequency: Day time voids 6.  Nocturia: 1-2 times per night to void.  Leakage: Walking to the bathroom, Coughing, Sneezing, Exercise, Intercourse, and going from sitting to standing, with a full bladder, without sensation ; patient gets the urge to urinate in standing and not sitting.  Pads: No   INTERCOURSE: not sexually active   PREGNANCY: C-section deliveries 3     OBJECTIVE:    DIAGNOSTIC FINDINGS:  Pelvic floor strength I/V   PVR of 22 ml was obtained by bladder scan      COGNITION: Overall cognitive status: Within functional limits for tasks assessed                          SENSATION: Light touch: Appears intact Proprioception: Appears intact     GAIT: Distance walked: 200 feet Assistive device utilized: Single point cane Level of assistance: Complete Independence Comments: recovering from back fusion   POSTURE: rounded shoulders, forward head, and decreased lumbar lordosis   PELVIC ALIGNMENT:   LUMBARAROM/PROM: not tested due to recent back surgery     LOWER EXTREMITY ROM:   Passive ROM Right eval Left eval Right 07/02/22 Left  07/02/22  Hip flexion 115 110 115 115  Hip internal rotation -5 0 15 10  Hip external rotation 70 70 70 70   (Blank rows = not tested)         PALPATION:   General  unable to tighten her lower abdominals, tenderness located in the lower abdominal, restrictions of the c-section scar                 External Perineal Exam vaginal dryness  Internal Pelvic Floor tenderness located on bilateral levator ani, obturator internist, sides of the urethra and bladder   Patient confirms identification and approves PT to assess internal pelvic floor and treatment Yes   PELVIC MMT:   MMT eval 05/16/22 05/30/22 06/11/22  Vaginal 3/5 anterior and posterior 2/5 laterally with right side weaker 3/5 with circular contraction 4/5 with good lift and circular contraction 4/5 in supine  (Blank rows = not tested)         TONE: increased     TODAY'S TREATMENT:   07/11/22  Exercises: Strengthening: Supine pelvic floor contraction hip flexion 15 x each side Supine march with pelvic floor contraction Supine hip  abduction with pelvic floor contraction  Therapeutic activities: Functional strengthening activities: Walking 20 feet with cane then stop contract pelvic floor turn to toilet, contract pelvic floor then sit on mat to facilitate her walking to the commode to sit on it with pelvic floor contraction.  Stand to sit with pelvic floor contraction and having patient increase her hip flexion to sit with control and not hold her breath.    07/02/22 Exercises: Strengthening: Marching 10 x each side with abdominal contraction Dead bug with tactile cues for abdominal contraction prior to lifting the extremities 20 x each side Knee fall out with red band 15 x each leg with abdominal and pelvic floor contraction Standing pressing hands into wall and perform shoulder flexion with core engaged 20 x Therapeutic activities: Functional strengthening activities: Educated patient on how the bladder fills, how much urine makes the bladder have the urge to urinate but more time to fill, how to wait every 2 hours to urinate  06/27/22 Manual: Internal pelvic floor techniques: No emotional/communication barriers or cognitive limitation. Patient is motivated to learn. Patient understands and agrees with treatment goals and plan. PT explains patient will be  examined in standing, sitting, and lying down to see how their muscles and joints work. When they are ready, they will be asked to remove their underwear so PT can examine their perineum. The patient is also given the option of providing their own chaperone as one is not provided in our facility. The patient also has the right and is explained the right to defer or refuse any part of the evaluation or treatment including the internal exam. With the patient's consent, PT will use one gloved finger to gently assess the muscles of the pelvic floor, seeing how well it contracts and relaxes and if there is muscle symmetry. After, the patient will get dressed and PT and patient will discuss exam findings and plan of care. PT and patient discuss plan of care, schedule, attendance policy and HEP activities.  Going through vaginally working on the introitus, levator ani, obturator internist, release around the urethra, along the bladder       PATIENT EDUCATION: 07/11/22 Education details: Access Code: [email or web address] Person educated: Patient Education method: Explanation, Demonstration, Tactile cues, Verbal cues, and Handouts Education comprehension: verbalized understanding, returned demonstration, verbal cues required, tactile cues required, and needs further education     HOME EXERCISE PROGRAM: 07/11/22 Access Code: [facility] URL: [email or web address] Date: 07/11/2022 Prepared by: Eulis Foster  Program Notes supine move leg to the side and back contracting the pelvic floor10 x each side  Exercises - - Supine March  - 1 x daily - 7 x weekly - 3 sets - 10 reps - Sit to Stand with Pelvic Floor Contraction  - 1 x daily - 7 x weekly - 1 sets -  5 reps   ASSESSMENT:   CLINICAL IMPRESSION: Patient is a 62 y.o. female who was seen today for physical therapy  treatment for levator ani spasm.  Patient main issue is walking to the bathroom at night with a walker and being able to take to time to sit  on the commode before urine comes out. In therapy today we worked on the steps to get to the bathroom with pelvic floor contraction.   Patient will benefit from skilled therapy to elongate her pelvic floor so she will be able to coordinate the muscles to reduce her urinary leakage.    OBJECTIVE IMPAIRMENTS: decreased endurance, decreased ROM, decreased strength, increased fascial restrictions, and increased muscle spasms.    ACTIVITY LIMITATIONS: sitting, standing, transfers, continence, and locomotion level   PARTICIPATION LIMITATIONS: meal prep, cleaning, laundry, and community activity   PERSONAL FACTORS: Age, Past/current experiences, Time since onset of injury/illness/exacerbation, and 3+ comorbidities: Breast cancer; DM; Hypothyroidism; sleep apnea; bilateral total mastectomy; c-section; cervical disc arthroplasty; Gallbladder removal; L3-L5 spinal fusion 01/16/22  are also affecting patient's functional outcome.    REHAB POTENTIAL: Good   CLINICAL DECISION MAKING: Evolving/moderate complexity   EVALUATION COMPLEXITY: Moderate     GOALS: Goals reviewed with patient? Yes   SHORT TERM GOALS: Target date: 05/20/22   Patient is independent with initial HEP  for c-section scar massage to improve tissue mobility.  Baseline: Goal status: Met 05/09/22   2.  Patient is independent with initial HEP for hip mobility and pelvic floor drop to lengthen the muscles.  Baseline:  Goal status: Met 06/18/22   3.  Patient able to perform diaphragmatic breathing to relax her pelvic floor.  Baseline:  Goal status: Met 05/21/22     LONG TERM GOALS: Target date: 07/16/22   Patient independent with advanced HEP for pelvic floor strengthening and elongation to reduce her urinary leakage.  Baseline:  Goal status: Ongoing 06/18/22   2.  Pelvic floor strength is >/= 3/5 with circular hug of therapist finger to reduce her urinary leakage.  Baseline:  Goal status: Met 5.9.24   3.  Patient is able to  wake up in the middle of the night and walk to the bathroom without leaking urine due to improve pelvic floor coordination.  Baseline:  Goal status: Met 06/11/22   4.   Urinary leakage with coughing, sneezing and laughing is reduced >/= 75% due to improved pelvic floor strength and coordination.  Baseline:  Goal status: ongoing 06/18/22     5.  Bilateral hip flexion P/ROM is >/= 115 degrees so she is able to sit on the commode and fully empty her bladder.  Baseline: Hip flexion is 115 degrees Goal status: ongoing 07/02/22     PLAN:   PT FREQUENCY: 1-2x/week   PT DURATION: 12 weeks   PLANNED INTERVENTIONS: Therapeutic exercises, Therapeutic activity, Neuromuscular re-education, Patient/Family education, Joint mobilization, Aquatic Therapy, Dry Needling, Electrical stimulation, Cryotherapy, Moist heat, scar mobilization, Taping, Ultrasound, Biofeedback, and Manual therapy   PLAN FOR NEXT SESSION:  internal work possible,  abdominal exercises low level, renewal or discharge  Eulis Foster, PT 07/11/22 11:40 AM

## 2022-07-12 ENCOUNTER — Telehealth: Payer: Self-pay | Admitting: Neurology

## 2022-07-12 NOTE — Telephone Encounter (Signed)
Pt is asking for a call to discuss the error message she has received on her CPAP" [employer] exceeded, please call pt to discuss.

## 2022-07-14 ENCOUNTER — Other Ambulatory Visit: Payer: Self-pay | Admitting: Psychiatry

## 2022-07-14 DIAGNOSIS — F411 Generalized anxiety disorder: Secondary | ICD-10-CM

## 2022-07-14 DIAGNOSIS — F32A Depression, unspecified: Secondary | ICD-10-CM

## 2022-07-15 NOTE — Telephone Encounter (Signed)
I reviewed the last office note. The plan is to do HST so patient can get a new machine. The HST was ordered. I called the patient back and LVM (ok per DPR) advising patient of this and I advised that as long as her machine is working she can continue using it each night. The night of her HST she should not use her machine so we can get diagnostic data to establish a new diagnosis of her sleep apnea. If her machine stops working, she can contact her DME company to ask for a loaner machine until she gets a new one. I told her I would check with our sleep team to see if the HST is approved. I also asked her to call us if she hasn't been called to schedule the HST yet.

## 2022-07-16 ENCOUNTER — Encounter: Payer: Self-pay | Admitting: Physical Therapy

## 2022-07-16 ENCOUNTER — Encounter: Payer: No Typology Code available for payment source | Admitting: Physical Therapy

## 2022-07-16 DIAGNOSIS — M6281 Muscle weakness (generalized): Secondary | ICD-10-CM | POA: Diagnosis not present

## 2022-07-16 DIAGNOSIS — N3941 Urge incontinence: Secondary | ICD-10-CM

## 2022-07-16 NOTE — Therapy (Signed)
OUTPATIENT PHYSICAL THERAPY TREATMENT NOTE   Patient Name: [name] MRN: [id number] DOB:Jun 10, 1960, 62 y.o., female Today's Date: 07/16/2022  PCP: Creola Corn, MD  REFERRING PROVIDER:  Marguerita Beards, MD   END OF SESSION:   PT End of Session - 07/16/22 1142     Visit Number 11    Date for PT Re-Evaluation 07/16/22    Authorization Type [employer]    PT Start Time 1140   came late   PT Stop Time 1220    PT Time Calculation (min) 40 min    Activity Tolerance Patient tolerated treatment well    Behavior During Therapy WFL for tasks assessed/performed             Past Medical History:  Diagnosis Date   Back pain of lumbar region with sciatica    Cancer (HCC)    breast   Diabetes mellitus, type II (HCC)    Family history of anesthesia complication    grandfather died under anesthesia 70's- had heart issues   H/O carpal tunnel repair 2014   Headache(784.0)    Hyperlipidemia    Hypertension    Hypothyroidism    Medial meniscus tear    PONV (postoperative nausea and vomiting)    Sleep apnea    cpap since 10   Past Surgical History:  Procedure Laterality Date   BILATERAL TOTAL MASTECTOMY WITH AXILLARY LYMPH NODE DISSECTION     BREAST RECONSTRUCTION     CERVICAL DISC ARTHROPLASTY N/A 03/19/2013   Procedure: CERVICAL FIVE TO SIX, CERVICAL SIX TO SEVEN CERVICAL ANTERIOR DISC ARTHROPLASTY;  Surgeon: Barnett Abu, MD;  Location: [facility];  Service: Neurosurgery;  Laterality: N/A;  C56 C67 artificial disc replacement   CESAREAN SECTION     COLONOSCOPY     DILATATION & CURETTAGE/HYSTEROSCOPY WITH TRUECLEAR N/A 11/25/2013   Procedure: DILATATION & CURETTAGE/HYSTEROSCOPY WITH TRUCLEAR;  Surgeon: Meriel Pica, MD;  Location: [facility];  Service: Gynecology;  Laterality: N/A;   ECTOPIC PREGNANCY SURGERY     GALLBLADDER SURGERY     MOUTH SURGERY     child- dog bite   SPINAL FUSION     2023   TONSILLECTOMY     TUBAL LIGATION     Patient Active Problem List    Diagnosis Date Noted   Exertional dyspnea 04/17/2022   Elevated coronary artery calcium score 04/17/2022   Spondylolisthesis at L4-L5 level 12/27/2021   Ductal carcinoma in situ (DCIS) of left breast 01/19/2018   Generalized anxiety disorder 11/07/2017   Depression 11/07/2017   Insomnia 11/07/2017   Cervical spondylosis 03/19/2013   REFERRING DIAG: W09.811 (ICD-10-CM) - Levator spasm    THERAPY DIAG:  M62.81 Muscle weakness (generalized) N39.41 Urgency Incontinence   Rationale for Evaluation and Treatment: Rehabilitation   ONSET DATE: 2021   SUBJECTIVE:  SUBJECTIVE STATEMENT: I have not had an accident since last visit. I have been trying to practice what we did at last visit.   PAIN:  Are you having pain? Yes NPRS scale: 5/10 Pain location:  back   Pain type: pins and needles Pain description: constant    Aggravating factors: moving, sitting  Relieving factors: rest   PRECAUTIONS: Other: recent lumbar fusion 12/23   WEIGHT BEARING RESTRICTIONS: No   FALLS:  Has patient fallen in last 6 months? No   LIVING ENVIRONMENT: Lives with: lives with their daughter   OCCUPATION: recovering from back surgery   PLOF: Independent   PATIENT GOALS: reduce urinary leakage, not have to clean up bathroom in middle of night due to accident   PERTINENT HISTORY:  Breast cancer; DM; Hypothyroidism; sleep apnea; bilateral total mastectomy; c-section; cervical disc arthroplasty; Gallbladder removal; L3-L5 spinal fusion 01/16/22 Sexual abuse: No   BOWEL MOVEMENT: Pain with bowel movement: No Type of bowel movement:Type (Bristol Stool Scale) varies, Frequency every 2-3 days, Strain Yes, and Splinting yes Fully empty rectum: No due to back surgery  Leakage: No Fiber supplement: Yes: stool softner    URINATION: Pain with urination: No Fully empty bladder: No, Has to lean forward or backward to empty completely ;  dribbling after finishing, and the need to urinate multiple times in a row  Stream: Strong Urgency: Yes:   patient does not wake up in time to go to the bathroom and still recovering from back surger.  Frequency: Day time voids 6.  Nocturia: 1-2 times per night to void.  Leakage: Walking to the bathroom, Coughing, Sneezing, Exercise, Intercourse, and going from sitting to standing, with a full bladder, without sensation ; patient gets the urge to urinate in standing and not sitting.  Pads: No   INTERCOURSE: not sexually active   PREGNANCY: C-section deliveries 3     OBJECTIVE:    DIAGNOSTIC FINDINGS:  Pelvic floor strength I/V   PVR of 22 ml was obtained by bladder scan      COGNITION: Overall cognitive status: Within functional limits for tasks assessed                          SENSATION: Light touch: Appears intact Proprioception: Appears intact     GAIT: Distance walked: 200 feet Assistive device utilized: Single point cane Level of assistance: Complete Independence Comments: recovering from back fusion   POSTURE: rounded shoulders, forward head, and decreased lumbar lordosis   PELVIC ALIGNMENT:   LUMBARAROM/PROM: not tested due to recent back surgery     LOWER EXTREMITY ROM:   Passive ROM Right eval Left eval Right 07/02/22 Left  07/02/22 Right 07/16/22 Left 07/16/22  Hip flexion 115 110 115 115 115 115  Hip internal rotation -5 0 15 10 15 15   Hip external rotation 70 70 70 70 80 80   (Blank rows = not tested)         PALPATION:   General  unable to tighten her lower abdominals, tenderness located in the lower abdominal, restrictions of the c-section scar                 External Perineal Exam vaginal dryness                             Internal Pelvic Floor tenderness located on bilateral levator ani, obturator internist, sides of the  urethra and  bladder   Patient confirms identification and approves PT to assess internal pelvic floor and treatment Yes   PELVIC MMT:   MMT eval 05/16/22 05/30/22 06/11/22  Vaginal 3/5 anterior and posterior 2/5 laterally with right side weaker 3/5 with circular contraction 4/5 with good lift and circular contraction 4/5 in supine  (Blank rows = not tested)         TONE: increased     TODAY'S TREATMENT:   07/16/22 Manual: Soft tissue mobilization: Manual work to the lower abdomen to reduce the tenderness Scar tissue mobilization: Manual work to the scar moving in different directions, holding position for 30 seconds to stretch the scar tissue Myofascial release: Release of the mesenteric root and the right lower abdominal area where there is tight tissue.   07/11/22  Exercises: Strengthening: Supine pelvic floor contraction hip flexion 15 x each side Supine march with pelvic floor contraction Supine hip  abduction with pelvic floor contraction  Therapeutic activities: Functional strengthening activities: Walking 20 feet with cane then stop contract pelvic floor turn to toilet, contract pelvic floor then sit on mat to facilitate her walking to the commode to sit on it with pelvic floor contraction.  Stand to sit with pelvic floor contraction and having patient increase her hip flexion to sit with control and not hold her breath.    07/02/22 Exercises: Strengthening: Marching 10 x each side with abdominal contraction Dead bug with tactile cues for abdominal contraction prior to lifting the extremities 20 x each side Knee fall out with red band 15 x each leg with abdominal and pelvic floor contraction Standing pressing hands into wall and perform shoulder flexion with core engaged 20 x Therapeutic activities: Functional strengthening activities: Educated patient on how the bladder fills, how much urine makes the bladder have the urge to urinate but more time to fill, how to wait  every 2 hours to urinate      PATIENT EDUCATION: 07/11/22 Education details: Access Code: [email or web address] Person educated: Patient Education method: Explanation, Demonstration, Tactile cues, Verbal cues, and Handouts Education comprehension: verbalized understanding, returned demonstration, verbal cues required, tactile cues required, and needs further education     HOME EXERCISE PROGRAM: 07/11/22 Access Code: [facility] URL: [email or web address] Date: 07/11/2022 Prepared by: Eulis Foster  Program Notes supine move leg to the side and back contracting the pelvic floor10 x each side  Exercises - - Supine March  - 1 x daily - 7 x weekly - 3 sets - 10 reps - Sit to Stand with Pelvic Floor Contraction  - 1 x daily - 7 x weekly - 1 sets - 5 reps   ASSESSMENT:   CLINICAL IMPRESSION: Patient is a 62 y.o. female who was seen today for physical therapy  treatment for levator ani spasm.  Patient main issue is walking to the bathroom at night with a walker and being able to take to time to sit on the commode before urine comes out. She is able to contract her lower abdominals better. She continues to have restrictions in the c-section scar. She has increased ROM of bilateral hips. She is able to contract the abdominals now but working on building the strength.  Pelvic floor strength increased to 4/5. She continues to heal from her lumbar surgery 7 months ago.  Patient will benefit from skilled therapy to elongate her pelvic floor so she will be able to coordinate the muscles to reduce her urinary leakage.    OBJECTIVE IMPAIRMENTS: decreased endurance, decreased ROM, decreased strength, increased  fascial restrictions, and increased muscle spasms.    ACTIVITY LIMITATIONS: sitting, standing, transfers, continence, and locomotion level   PARTICIPATION LIMITATIONS: meal prep, cleaning, laundry, and community activity   PERSONAL FACTORS: Age, Past/current experiences, Time since onset of  injury/illness/exacerbation, and 3+ comorbidities: Breast cancer; DM; Hypothyroidism; sleep apnea; bilateral total mastectomy; c-section; cervical disc arthroplasty; Gallbladder removal; L3-L5 spinal fusion 01/16/22  are also affecting patient's functional outcome.    REHAB POTENTIAL: Good   CLINICAL DECISION MAKING: Evolving/moderate complexity   EVALUATION COMPLEXITY: Moderate     GOALS: Goals reviewed with patient? Yes   SHORT TERM GOALS: Target date: 05/20/22   Patient is independent with initial HEP  for c-section scar massage to improve tissue mobility.  Baseline: Goal status: Met 05/09/22   2.  Patient is independent with initial HEP for hip mobility and pelvic floor drop to lengthen the muscles.  Baseline:  Goal status: Met 06/18/22   3.  Patient able to perform diaphragmatic breathing to relax her pelvic floor.  Baseline:  Goal status: Met 05/21/22     LONG TERM GOALS: Target date: 10/07/22   Patient independent with advanced HEP for pelvic floor strengthening and elongation to reduce her urinary leakage.  Baseline:  Goal status: Ongoing 07/16/22   2.  Pelvic floor strength is >/= 3/5 with circular hug of therapist finger to reduce her urinary leakage.  Baseline:  Goal status: Met 5.9.24   3.  Patient is able to wake up in the middle of the night and walk to the bathroom without leaking urine due to improve pelvic floor coordination.  Baseline:  Goal status: Met 06/11/22   4.   Urinary leakage with coughing, sneezing and laughing is reduced >/= 75% due to improved pelvic floor strength and coordination.  Baseline: not since  Goal status: ongoing 07/11/22     5.  Bilateral hip flexion P/ROM is >/= 115 degrees so she is able to sit on the commode and fully empty her bladder.  Baseline: Hip flexion is 115 degrees, takes 2 times to fully empty and uses her abdominals, does not lean ofrward or backward Goal status: ongoing 07/16/22     PLAN:   PT FREQUENCY: 1-2x/week    PT DURATION: 12 weeks   PLANNED INTERVENTIONS: Therapeutic exercises, Therapeutic activity, Neuromuscular re-education, Patient/Family education, Joint mobilization, Aquatic Therapy, Dry Needling, Electrical stimulation, Cryotherapy, Moist heat, scar mobilization, Taping, Ultrasound, Biofeedback, and Manual therapy   PLAN FOR NEXT SESSION:  internal work possible,  abdominal exercises low level, renewal or discharge  Eulis Foster, PT 07/16/22 11:42 AM

## 2022-07-17 HISTORY — PX: BREAST RECONSTRUCTION: SHX9

## 2022-07-21 NOTE — Progress Notes (Unsigned)
  [facility] Return Visit  SUBJECTIVE  History of Present Illness: [name] is a 62 y.o. female seen in follow-up for Levator spasm and SUI. Plan at last visit was start pelvic floor PT and consider options between pessary for SUI and urethral bulking.   She reports her physical therapy has been going well. She has been able to control her leaking better. Has still had some minimal leaking but it is improving. She reports she is having so much improvement with Eulis Foster. Has also been using mental techniques and Kegel's.   Still having some leaking with coughing/sneezing.    Past Medical History: Patient  has a past medical history of Back pain of lumbar region with sciatica, Cancer (HCC), Diabetes mellitus, type II (HCC), Family history of anesthesia complication, H/O carpal tunnel repair (2014), Headache(784.0), Hyperlipidemia, Hypertension, Hypothyroidism, Medial meniscus tear, PONV (postoperative nausea and vomiting), and Sleep apnea.   Past Surgical History: She  has a past surgical history that includes Gallbladder surgery; Cesarean section; Tubal ligation; Colonoscopy; Ectopic pregnancy surgery; Tonsillectomy; Mouth surgery; Cervical disc arthroplasty (N/A, 03/19/2013); Dilatation & curettage/hysteroscopy with trueclear (N/A, 11/25/2013); Bilateral total mastectomy with axillary lymph node dissection; Breast reconstruction; and Spinal fusion.   Medications: She has a current medication list which includes the following prescription(s): alprazolam, aspirin ec, atorvastatin, doxazosin, famotidine-calcium carbonate-magnesium hydroxide, gabapentin, gabapentin, galcanezumab-gnlm, hydrochlorothiazide, ibuprofen, jardiance, dayvigo, metformin, metoprolol succinate, naproxen sodium, onabotulinumtoxina, rizatriptan, naphazoline-pheniramine, thyroid, ubrelvy, and vilazodone hcl, and the following Facility-Administered Medications: ondansetron (ZOFRAN) 4 mg in sodium chloride  0.9 % 50 mL IVPB.   Allergies: Patient is allergic to cyclobenzaprine, effexor [venlafaxine hcl], linaclotide, and venlafaxine.   Social History: Patient  reports that she has never smoked. She has never used smokeless tobacco. She reports current alcohol use. She reports that she does not use drugs.      OBJECTIVE     Physical Exam: Vitals:   07/22/22 0855  BP: 116/72  Pulse: 66   Gen: No apparent distress, A&O x 3.  Detailed Urogynecologic Evaluation:  Deferred.    ASSESSMENT AND PLAN    [name] is a 62 y.o. with:  1. Levator spasm   2. Urinary frequency   3. SUI (stress urinary incontinence, female)    Patient reports significant improvement with pelvic floor PT.  She has been more able to hold her urine with PT. We also discussed that adding in Pumpkin seed extract can be beneficial in assisting with bladder urgency  She does not wish to pursue bulking or pessary at this time as she is still doing PT. She is currently content with PT and would like to keep the options for pessary or bulking on the back burner.   Patient to call if she wants to pursue bulking or pessary.

## 2022-07-22 ENCOUNTER — Ambulatory Visit: Payer: No Typology Code available for payment source | Admitting: Obstetrics and Gynecology

## 2022-07-22 ENCOUNTER — Encounter: Payer: Self-pay | Admitting: Obstetrics and Gynecology

## 2022-07-22 VITALS — BP 116/72 | HR 66

## 2022-07-22 DIAGNOSIS — N393 Stress incontinence (female) (male): Secondary | ICD-10-CM

## 2022-07-22 DIAGNOSIS — M62838 Other muscle spasm: Secondary | ICD-10-CM | POA: Diagnosis not present

## 2022-07-22 DIAGNOSIS — R350 Frequency of micturition: Secondary | ICD-10-CM | POA: Diagnosis not present

## 2022-07-22 NOTE — Patient Instructions (Addendum)
If you decide you want to do the urethral bulking or pessary, please let us know.   I am so happy you have had great response from PT!   Just let us know if you need anything.   Pumpkin seed extract up to 5gm a day. [employer] has the gummies and the capsules online.   Magnesium 250mg  can assist in bowel movements.

## 2022-07-31 ENCOUNTER — Ambulatory Visit (INDEPENDENT_AMBULATORY_CARE_PROVIDER_SITE_OTHER): Payer: No Typology Code available for payment source | Admitting: Neurology

## 2022-07-31 DIAGNOSIS — G4733 Obstructive sleep apnea (adult) (pediatric): Secondary | ICD-10-CM

## 2022-07-31 DIAGNOSIS — Z789 Other specified health status: Secondary | ICD-10-CM

## 2022-08-01 ENCOUNTER — Encounter: Payer: Self-pay | Admitting: Physical Therapy

## 2022-08-01 ENCOUNTER — Encounter
Payer: No Typology Code available for payment source | Attending: Obstetrics and Gynecology | Admitting: Physical Therapy

## 2022-08-01 DIAGNOSIS — N3941 Urge incontinence: Secondary | ICD-10-CM | POA: Diagnosis present

## 2022-08-01 DIAGNOSIS — M6281 Muscle weakness (generalized): Secondary | ICD-10-CM

## 2022-08-01 NOTE — Therapy (Signed)
OUTPATIENT PHYSICAL THERAPY TREATMENT NOTE   Patient Name: [name] MRN: [id number] DOB:Mar 06, 1960, 62 y.o., female Today's Date: 08/01/2022  PCP: Creola Corn, MD  REFERRING PROVIDER:  Marguerita Beards, MD   END OF SESSION:   PT End of Session - 08/01/22 1508     Visit Number 12    Date for PT Re-Evaluation 10/07/22    Authorization Type [employer]    PT Start Time 1500    PT Stop Time 1545    PT Time Calculation (min) 45 min    Activity Tolerance Patient tolerated treatment well    Behavior During Therapy WFL for tasks assessed/performed             Past Medical History:  Diagnosis Date   Back pain of lumbar region with sciatica    Cancer (HCC)    breast   Diabetes mellitus, type II (HCC)    Family history of anesthesia complication    grandfather died under anesthesia 70's- had heart issues   H/O carpal tunnel repair 2014   Headache(784.0)    Hyperlipidemia    Hypertension    Hypothyroidism    Medial meniscus tear    PONV (postoperative nausea and vomiting)    Sleep apnea    cpap since 10   Past Surgical History:  Procedure Laterality Date   BILATERAL TOTAL MASTECTOMY WITH AXILLARY LYMPH NODE DISSECTION     BREAST RECONSTRUCTION     CERVICAL DISC ARTHROPLASTY N/A 03/19/2013   Procedure: CERVICAL FIVE TO SIX, CERVICAL SIX TO SEVEN CERVICAL ANTERIOR DISC ARTHROPLASTY;  Surgeon: Barnett Abu, MD;  Location: [facility];  Service: Neurosurgery;  Laterality: N/A;  C56 C67 artificial disc replacement   CESAREAN SECTION     COLONOSCOPY     DILATATION & CURETTAGE/HYSTEROSCOPY WITH TRUECLEAR N/A 11/25/2013   Procedure: DILATATION & CURETTAGE/HYSTEROSCOPY WITH TRUCLEAR;  Surgeon: Meriel Pica, MD;  Location: [facility];  Service: Gynecology;  Laterality: N/A;   ECTOPIC PREGNANCY SURGERY     GALLBLADDER SURGERY     MOUTH SURGERY     child- dog bite   SPINAL FUSION     2023   TONSILLECTOMY     TUBAL LIGATION     Patient Active Problem List   Diagnosis  Date Noted   Exertional dyspnea 04/17/2022   Elevated coronary artery calcium score 04/17/2022   Spondylolisthesis at L4-L5 level 12/27/2021   Ductal carcinoma in situ (DCIS) of left breast 01/19/2018   Generalized anxiety disorder 11/07/2017   Depression 11/07/2017   Insomnia 11/07/2017   Cervical spondylosis 03/19/2013   REFERRING DIAG: M01.027 (ICD-10-CM) - Levator spasm    THERAPY DIAG:  M62.81 Muscle weakness (generalized) N39.41 Urgency Incontinence   Rationale for Evaluation and Treatment: Rehabilitation   ONSET DATE: 2021   SUBJECTIVE:  SUBJECTIVE STATEMENT: My TKR surgery was moved up. I told her I was having success on the physical therapy. I  do not want to use the pessary at this point. I can hold my urine so much longer.    PAIN:  Are you having pain? Yes NPRS scale: 4/10 Pain location:  back   Pain type: pins and needles Pain description: constant    Aggravating factors: moving, sitting  Relieving factors: rest   PRECAUTIONS: Other: recent lumbar fusion 12/23   WEIGHT BEARING RESTRICTIONS: No   FALLS:  Has patient fallen in last 6 months? No   LIVING ENVIRONMENT: Lives with: lives with their daughter   OCCUPATION: recovering from back surgery   PLOF: Independent   PATIENT GOALS: reduce urinary leakage, not have to clean up bathroom in middle of night due to accident   PERTINENT HISTORY:  Breast cancer; DM; Hypothyroidism; sleep apnea; bilateral total mastectomy; c-section; cervical disc arthroplasty; Gallbladder removal; L3-L5 spinal fusion 01/16/22 Sexual abuse: No   BOWEL MOVEMENT: Pain with bowel movement: No Type of bowel movement:Type (Bristol Stool Scale) varies, Frequency every 2-3 days, Strain Yes, and Splinting yes Fully empty rectum: No due to back  surgery  Leakage: No Fiber supplement: Yes: stool softner   URINATION: Pain with urination: No Fully empty bladder: No, Has to lean forward or backward to empty completely ;  dribbling after finishing, and the need to urinate multiple times in a row  Stream: Strong Urgency: Yes:   patient does not wake up in time to go to the bathroom and still recovering from back surger.  Frequency: Day time voids 6.  Nocturia: 1-2 times per night to void.  Leakage: Walking to the bathroom, Coughing, Sneezing, Exercise, Intercourse, and going from sitting to standing, with a full bladder, without sensation ; patient gets the urge to urinate in standing and not sitting.  Pads: No   INTERCOURSE: not sexually active   PREGNANCY: C-section deliveries 3     OBJECTIVE:    DIAGNOSTIC FINDINGS:  Pelvic floor strength I/V   PVR of 22 ml was obtained by bladder scan      COGNITION: Overall cognitive status: Within functional limits for tasks assessed                          SENSATION: Light touch: Appears intact Proprioception: Appears intact     GAIT: Distance walked: 200 feet Assistive device utilized: Single point cane Level of assistance: Complete Independence Comments: recovering from back fusion   POSTURE: rounded shoulders, forward head, and decreased lumbar lordosis   PELVIC ALIGNMENT:   LUMBARAROM/PROM: not tested due to recent back surgery     LOWER EXTREMITY ROM:   Passive ROM Right eval Left eval Right 07/02/22 Left  07/02/22 Right 07/16/22 Left 07/16/22  Hip flexion 115 110 115 115 115 115  Hip internal rotation -5 0 15 10 15 15   Hip external rotation 70 70 70 70 80 80   (Blank rows = not tested)         PALPATION:   General  unable to tighten her lower abdominals, tenderness located in the lower abdominal, restrictions of the c-section scar                 External Perineal Exam vaginal dryness  Internal Pelvic Floor tenderness located  on bilateral levator ani, obturator internist, sides of the urethra and bladder   Patient confirms identification and approves PT to assess internal pelvic floor and treatment Yes   PELVIC MMT:   MMT eval 05/16/22 05/30/22 06/11/22  Vaginal 3/5 anterior and posterior 2/5 laterally with right side weaker 3/5 with circular contraction 4/5 with good lift and circular contraction 4/5 in supine  (Blank rows = not tested)         TONE: increased     TODAY'S TREATMENT:  08/01/22 Exercises: Strengthening: ( abdominal and pelvic floor contraction) Supine hip abduction with green band 15 X each side Marching 10 x each side with abdominal contraction with yellow band Supine foot on ball and roll back and forth 15 x each leg Supine ball squeeze with yellow band around wrist and moving arm up and down 15 x    07/16/22 Manual: Soft tissue mobilization: Manual work to the lower abdomen to reduce the tenderness Scar tissue mobilization: Manual work to the scar moving in different directions, holding position for 30 seconds to stretch the scar tissue Myofascial release: Release of the mesenteric root and the right lower abdominal area where there is tight tissue.   07/11/22  Exercises: Strengthening: Supine pelvic floor contraction hip flexion 15 x each side Supine march with pelvic floor contraction Supine hip  abduction with pelvic floor contraction  Therapeutic activities: Functional strengthening activities: Walking 20 feet with cane then stop contract pelvic floor turn to toilet, contract pelvic floor then sit on mat to facilitate her walking to the commode to sit on it with pelvic floor contraction.  Stand to sit with pelvic floor contraction and having patient increase her hip flexion to sit with control and not hold her breath.    07/02/22 Exercises: Strengthening: Marching 10 x each side with abdominal contraction Dead bug with tactile cues for abdominal contraction prior to lifting  the extremities 20 x each side Knee fall out with red band 15 x each leg with abdominal and pelvic floor contraction Standing pressing hands into wall and perform shoulder flexion with core engaged 20 x Therapeutic activities: Functional strengthening activities: Educated patient on how the bladder fills, how much urine makes the bladder have the urge to urinate but more time to fill, how to wait every 2 hours to urinate      PATIENT EDUCATION: 08/01/22 Education details: Access Code: [email or web address] Person educated: Patient Education method: Explanation, Demonstration, Tactile cues, Verbal cues, and Handouts Education comprehension: verbalized understanding, returned demonstration, verbal cues required, tactile cues required, and needs further education     HOME EXERCISE PROGRAM: 08/01/22 Access Code: [facility] URL: [email or web address] Date: 07/11/2022 Prepared by: Eulis Foster  Program Notes supine move leg to the side and back contracting the pelvic floor10 x each side  Exercises - - Supine March  - 1 x daily - 7 x weekly - 3 sets - 10 reps - Sit to Stand with Pelvic Floor Contraction  - 1 x daily - 7 x weekly - 1 sets - 5 reps   ASSESSMENT:   CLINICAL IMPRESSION: Patient is a 62 y.o. female who was seen today for physical therapy  treatment for levator ani spasm.  No urinary leakage since last visit.  She is able to engage her lower abdominals better. She is going to have TKR in August so her last visit will be next week. Patient will benefit from skilled therapy to elongate her pelvic floor so she will  be able to coordinate the muscles to reduce her urinary leakage.    OBJECTIVE IMPAIRMENTS: decreased endurance, decreased ROM, decreased strength, increased fascial restrictions, and increased muscle spasms.    ACTIVITY LIMITATIONS: sitting, standing, transfers, continence, and locomotion level   PARTICIPATION LIMITATIONS: meal prep, cleaning, laundry, and community  activity   PERSONAL FACTORS: Age, Past/current experiences, Time since onset of injury/illness/exacerbation, and 3+ comorbidities: Breast cancer; DM; Hypothyroidism; sleep apnea; bilateral total mastectomy; c-section; cervical disc arthroplasty; Gallbladder removal; L3-L5 spinal fusion 01/16/22  are also affecting patient's functional outcome.    REHAB POTENTIAL: Good   CLINICAL DECISION MAKING: Evolving/moderate complexity   EVALUATION COMPLEXITY: Moderate     GOALS: Goals reviewed with patient? Yes   SHORT TERM GOALS: Target date: 05/20/22   Patient is independent with initial HEP  for c-section scar massage to improve tissue mobility.  Baseline: Goal status: Met 05/09/22   2.  Patient is independent with initial HEP for hip mobility and pelvic floor drop to lengthen the muscles.  Baseline:  Goal status: Met 06/18/22   3.  Patient able to perform diaphragmatic breathing to relax her pelvic floor.  Baseline:  Goal status: Met 05/21/22     LONG TERM GOALS: Target date: 10/07/22   Patient independent with advanced HEP for pelvic floor strengthening and elongation to reduce her urinary leakage.  Baseline:  Goal status: Ongoing 07/16/22   2.  Pelvic floor strength is >/= 3/5 with circular hug of therapist finger to reduce her urinary leakage.  Baseline:  Goal status: Met 5.9.24   3.  Patient is able to wake up in the middle of the night and walk to the bathroom without leaking urine due to improve pelvic floor coordination.  Baseline:  Goal status: Met 06/11/22   4.   Urinary leakage with coughing, sneezing and laughing is reduced >/= 75% due to improved pelvic floor strength and coordination.  Baseline: not since  Goal status: ongoing 07/11/22     5.  Bilateral hip flexion P/ROM is >/= 115 degrees so she is able to sit on the commode and fully empty her bladder.  Baseline: Hip flexion is 115 degrees, takes 2 times to fully empty and uses her abdominals, does not lean ofrward or  backward Goal status: ongoing 07/16/22     PLAN:   PT FREQUENCY: 1-2x/week   PT DURATION: 12 weeks   PLANNED INTERVENTIONS: Therapeutic exercises, Therapeutic activity, Neuromuscular re-education, Patient/Family education, Joint mobilization, Aquatic Therapy, Dry Needling, Electrical stimulation, Cryotherapy, Moist heat, scar mobilization, Taping, Ultrasound, Biofeedback, and Manual therapy   PLAN FOR NEXT SESSION:  internal work possible,  abdominal exercises low level, next visit discharge so she can have surgery to her knee  Eulis Foster, PT 08/01/22 3:51 PM

## 2022-08-02 ENCOUNTER — Encounter: Payer: Self-pay | Admitting: Psychiatry

## 2022-08-02 ENCOUNTER — Ambulatory Visit (INDEPENDENT_AMBULATORY_CARE_PROVIDER_SITE_OTHER): Payer: No Typology Code available for payment source | Admitting: Psychiatry

## 2022-08-02 DIAGNOSIS — F32A Depression, unspecified: Secondary | ICD-10-CM | POA: Diagnosis not present

## 2022-08-02 DIAGNOSIS — G4700 Insomnia, unspecified: Secondary | ICD-10-CM

## 2022-08-02 DIAGNOSIS — F4310 Post-traumatic stress disorder, unspecified: Secondary | ICD-10-CM | POA: Diagnosis not present

## 2022-08-02 DIAGNOSIS — F411 Generalized anxiety disorder: Secondary | ICD-10-CM

## 2022-08-02 MED ORDER — VILAZODONE HCL 20 MG PO TABS: 20.0000 mg | ORAL_TABLET | Freq: Every day | ORAL | 0 refills | Status: DC

## 2022-08-02 MED ORDER — DAYVIGO 10 MG PO TABS: 10.0000 mg | ORAL_TABLET | Freq: Every day | ORAL | 1 refills | Status: DC

## 2022-08-02 MED ORDER — DOXAZOSIN MESYLATE 4 MG PO TABS
4.0000 mg | ORAL_TABLET | Freq: Every day | ORAL | 1 refills | Status: AC
Start: 2022-08-02 — End: ?

## 2022-08-02 MED ORDER — GABAPENTIN 100 MG PO CAPS: 100.0000 mg | ORAL_CAPSULE | Freq: Two times a day (BID) | ORAL | 1 refills | Status: DC | PRN

## 2022-08-02 MED ORDER — GABAPENTIN 300 MG PO CAPS
300.0000 mg | ORAL_CAPSULE | Freq: Every day | ORAL | 1 refills | Status: DC
Start: 2022-09-16 — End: 2023-01-06

## 2022-08-02 NOTE — Progress Notes (Signed)
[name] [id number] Sep 17, 1960 62 y.o.  Subjective:   Patient ID:  [name] is a 62 y.o. (DOB [birth year]) female.  Chief Complaint:  Chief Complaint  Patient presents with   Follow-up    Anxiety, insomnia, and depression    HPI [name] presents to the office today for follow-up of anxiety, depression, and insomnia. [name] is scheduled for knee replacement surgery next month. [name] reports that [name] has some anxiety about surgery "but excited to get it behind me." Seeing a new PT and finds this helpful.   [name] reports that [name] is feeling "better." [name] reports that [name] has anxiety when her husband is around, "but it's better when I use my voice." [name] reports that [name] is setting some boundaries with husband. Plans on husband staying with her after her surgery to help care for her. One of her sons will come into town a week after her surgery and will help take care of her. [name] also has a friend that has offered to drive her to physical therapy. Denies any recent panic attacks. [name] reports that PTSD symptoms are "not as often" and tends to occur more at night. [name] reports some mild depression - "not too bad." Energy is somewhat low. [name] is physically limited with some activities. Motivation is lower than normal. Sleep is ok. [name] reports that Doxazosin is helpful for nightmares. Appetite has been good. [name] reports that her weight has been stable. Denies SI.   Reports that daughter has met someone and seems "happy."   Continues to see Bradley Ferris, LCAS for therapy.   Alprazolam last filled 07/14/22 x 3. Dayvigo last filled 07/06/22 for 90 day supply.  Gabapentin 300 mg last filled 06/24/22. Gabapentin 100 mg last filled 06/24/22.   Past Psychiatric Medication Trials: Ambien- Has been taking 5-10 mg Intermezzo- Helpful for sleep initiation Xanax Effexor XR-Multiple adverse effects. Severe discontinuation s/s. Zonisamide- SI. Lexapro-Concentration difficulty Viibryd Elavil-  Apatheric, difficulty with work Topamax- Anger, irritability Benadryl- partially effective.  Dayvigo- Effective. Has mild drowsiness the following day.  Doxepin- Excessive sedation Doxazosin Gabapentin    Flowsheet Row Admission (Discharged) from 12/27/2021 in [facility] Pre-Admission Testing 62 from 12/21/2021 in [facility] PREADMISSION TESTING  C-SSRS RISK CATEGORY No Risk No Risk        Review of Systems:  Review of Systems  Musculoskeletal:  Positive for gait problem.       [name] reports that her back pain is improving since surgery. Having increased knee pain.  Neurological:  Negative for tremors.  Psychiatric/Behavioral:         Please refer to HPI    Medications: I have reviewed the patient's current medications.  Current Outpatient Medications  Medication Sig Dispense Refill   ALPRAZolam (XANAX) 0.5 MG tablet Take 1/2-1 tablet three times daily as needed for anxiety or insomnia 75 tablet 2   aspirin EC 81 MG tablet Take 1 tablet (81 mg total) by mouth daily. 90 tablet 3   atorvastatin (LIPITOR) 20 MG tablet Take 1 tablet (20 mg total) by mouth daily. 90 tablet 3   doxazosin (CARDURA) 4 MG tablet Take 1 tablet (4 mg total) by mouth at bedtime. 90 tablet 1   famotidine-calcium carbonate-magnesium hydroxide (PEPCID COMPLETE) 10-800-165 MG chewable tablet Chew 1 tablet by mouth daily as needed.     [START ON 08/11/2022] gabapentin (NEURONTIN) 100 MG capsule Take 1 capsule (100 mg total) by mouth 2 (two) times daily  as needed (anxiety). 180 capsule 1   [START ON 09/16/2022] gabapentin (NEURONTIN) 300 MG capsule Take 1 capsule (300 mg total) by mouth at bedtime. 90 capsule 1   Galcanezumab-gnlm 120 MG/ML SOAJ Inject 120 mg into the skin every 30 (thirty) days.     hydrochlorothiazide (MICROZIDE) 12.5 MG capsule Take 12.5 mg by mouth daily.     ibuprofen (ADVIL) 200 MG tablet Take 1 tablet (200 mg total) by mouth every 6 (six) hours as needed. 30  tablet 0   JARDIANCE 10 MG TABS tablet Take 10 mg by mouth every other day.     [START ON 09/28/2022] Lemborexant (DAYVIGO) 10 MG TABS Take 1 tablet (10 mg total) by mouth at bedtime. 90 tablet 1   metFORMIN (GLUCOPHAGE) 500 MG tablet Take 500 mg by mouth daily with supper.     metoprolol succinate (TOPROL-XL) 100 MG 24 hr tablet 1 tablet Orally Once a day for 90 days     naproxen sodium (ALEVE) 220 MG tablet Take 220 mg by mouth.     OnabotulinumtoxinA (BOTOX IM) Inject into the muscle every 3 (three) months. FOR MIGRAINES     rizatriptan (MAXALT-MLT) 10 MG disintegrating tablet Take 10 mg by mouth as needed for migraine.      Tetrahydrozoline HCl (VISINE OP) Place 1 drop into both eyes daily as needed (irritation).     thyroid (ARMOUR) 30 MG tablet Take 30 mg by mouth daily before breakfast.      UBRELVY 50 MG TABS Take 50 mg by mouth daily as needed (migraine).     Vilazodone HCl 20 MG TABS Take 1 tablet (20 mg total) by mouth daily with breakfast. 90 tablet 0   No current facility-administered medications for this visit.   Facility-Administered Medications Ordered in Other Visits  Medication Dose Route Frequency Provider Last Rate Last Admin   ondansetron (ZOFRAN) 4 mg in sodium chloride 0.9 % 50 mL IVPB  4 mg Intravenous Q6H PRN Barnett Abu, MD        Medication Side Effects: None  Allergies:  Allergies  Allergen Reactions   Cyclobenzaprine Hives and Other (See Comments)   Effexor [Venlafaxine Hcl]     Weight loss and cant sleep   Linaclotide Other (See Comments)   Venlafaxine Other (See Comments)    Other Reaction(s): anxiety/ extreme weight loss    Past Medical History:  Diagnosis Date   Back pain of lumbar region with sciatica    Cancer (HCC)    breast   Diabetes mellitus, type II (HCC)    Family history of anesthesia complication    grandfather died under anesthesia 70's- had heart issues   H/O carpal tunnel repair 2014   Headache(784.0)    Hyperlipidemia     Hypertension    Hypothyroidism    Medial meniscus tear    PONV (postoperative nausea and vomiting)    Sleep apnea    cpap since 10    Past Medical History, Surgical history, Social history, and Family history were reviewed and updated as appropriate.   Please see review of systems for further details on the patient's review from today.   Objective:   Physical Exam:  LMP 11/12/2013   Physical Exam Constitutional:      General: [name] is not in acute distress. Musculoskeletal:        General: No deformity.  Neurological:     Mental Status: [name] is alert and oriented to person, place, and time.     Coordination: Coordination  normal.  Psychiatric:        Attention and Perception: Attention and perception normal. [name] does not perceive auditory or visual hallucinations.        Mood and Affect: Mood is anxious. Affect is not labile, blunt, angry or inappropriate.        Speech: Speech normal.        Behavior: Behavior normal.        Thought Content: Thought content normal. Thought content is not paranoid or delusional. Thought content does not include homicidal or suicidal ideation. Thought content does not include homicidal or suicidal plan.        Cognition and Memory: Cognition and memory normal.        Judgment: Judgment normal.     Comments: Insight intact Mood is anxious in response to upcoming surgery and marital stressors Mood presents as less depressed compared to recent exams     Lab Review:     Component Value Date/Time   NA 137 12/28/2021 0626   K 3.7 12/28/2021 0626   CL 104 12/28/2021 0626   CO2 25 12/28/2021 0626   GLUCOSE 127 (H) 12/28/2021 0626   BUN 7 12/28/2021 0626   CREATININE 0.66 12/28/2021 0626   CREATININE 0.73 01/19/2018 1556   CALCIUM 8.4 (L) 12/28/2021 0626   PROT 6.8 01/19/2018 1556   ALBUMIN 3.6 01/19/2018 1556   AST 15 01/19/2018 1556   ALT 15 01/19/2018 1556   ALKPHOS 53 01/19/2018 1556   BILITOT 0.3 01/19/2018 1556   GFRNONAA >60  12/28/2021 0626   GFRNONAA >60 01/19/2018 1556   GFRAA >60 01/19/2018 1556       Component Value Date/Time   WBC 7.7 12/28/2021 0626   RBC 4.14 12/28/2021 0626   HGB 12.2 12/28/2021 0626   HGB 12.2 01/19/2018 1556   HCT 36.7 12/28/2021 0626   PLT 181 12/28/2021 0626   PLT 263 01/19/2018 1556   MCV 88.6 12/28/2021 0626   MCH 29.5 12/28/2021 0626   MCHC 33.2 12/28/2021 0626   RDW 13.0 12/28/2021 0626   LYMPHSABS 2.3 01/19/2018 1556   MONOABS 0.6 01/19/2018 1556   EOSABS 0.6 (H) 01/19/2018 1556   BASOSABS 0.1 01/19/2018 1556    No results found for: "POCLITH", "LITHIUM"   No results found for: "PHENYTOIN", "PHENOBARB", "VALPROATE", "CBMZ"   .res Assessment: Plan:   I spent 47 minutes dedicated to the care of this patient on the date of this encounter to include pre-visit review of records, face-to-face time with the patient discussing marital therapy and managing stressors around upcoming surgery, ordering of medication, and post visit documentation. Pt reports that [name] and her husband are seeking marital therapy. Referral made to Bartolo Darter, [facility].  Assisted with problem-solving surrounding upcoming surgery in terms of making sure [name] has her needs met while maintaining boundaries, using coping strategies to increase autonomy and communication with others, and enlisting the help of several family members and friends. Will continue current medications without changes since pt reports some improvement in mood and anxiety symptoms. Will continue Viibryd 20 mg daily for anxiety and depression.  Continue Gabapentin 100 mg 1-2 daily in the morning for anxiety.  Continue Gabapentin 300 mg at bedtime for anxiety and insomnia.  Continue Dayvigo 10 mg po at bedtime for insomnia.  Continue Doxazosin 4 mg at bedtime for nightmares.  Continue Alprazolam 0.5 mg 1/2-1 tab three times daily as needed for anxiety or insomnia.  Pt to follow-up in 2-3 months or sooner if clinically  indicated.   Recommend continuing psychotherapy with Bradley Ferris, LCAS. Patient advised to contact office with any questions, adverse effects, or acute worsening in signs and symptoms.    [name] was seen today for follow-up.  Diagnoses and all orders for this visit:  Generalized anxiety disorder -     Vilazodone HCl 20 MG TABS; Take 1 tablet (20 mg total) by mouth daily with breakfast. -     gabapentin (NEURONTIN) 300 MG capsule; Take 1 capsule (300 mg total) by mouth at bedtime. -     gabapentin (NEURONTIN) 100 MG capsule; Take 1 capsule (100 mg total) by mouth 2 (two) times daily as needed (anxiety).  PTSD (post-traumatic stress disorder) -     Vilazodone HCl 20 MG TABS; Take 1 tablet (20 mg total) by mouth daily with breakfast. -     gabapentin (NEURONTIN) 300 MG capsule; Take 1 capsule (300 mg total) by mouth at bedtime.  Depression, unspecified depression type -     Vilazodone HCl 20 MG TABS; Take 1 tablet (20 mg total) by mouth daily with breakfast.  Insomnia, unspecified type -     Lemborexant (DAYVIGO) 10 MG TABS; Take 1 tablet (10 mg total) by mouth at bedtime. -     gabapentin (NEURONTIN) 300 MG capsule; Take 1 capsule (300 mg total) by mouth at bedtime. -     gabapentin (NEURONTIN) 100 MG capsule; Take 1 capsule (100 mg total) by mouth 2 (two) times daily as needed (anxiety). -     doxazosin (CARDURA) 4 MG tablet; Take 1 tablet (4 mg total) by mouth at bedtime.     Please see After Visit Summary for patient specific instructions.  Future Appointments  Date Time Provider Department Center  08/23/2022  3:00 PM Gaspar Bidding [facility] None  08/30/2022  3:00 PM Gaspar Bidding, [facility] CP-CP None  09/05/2022  9:30 AM Theressa Millard, PT [facility]  09/06/2022  4:00 PM Gaspar Bidding, [facility] CP-CP None  10/04/2022  2:00 PM [facility] PAT 1 [facility] None  10/24/2022  1:00 PM Corie Chiquito, PMHNP CP-CP None    No orders of the defined types were placed in this  encounter.   -------------------------------

## 2022-08-05 NOTE — Progress Notes (Signed)
See procedure note.

## 2022-08-05 NOTE — Addendum Note (Signed)
Addended by: Huston Foley on: 08/05/2022 05:59 PM   Modules accepted: Orders

## 2022-08-05 NOTE — Procedures (Signed)
[facility]  HOME SLEEP TEST (Watch PAT) REPORT  STUDY DATE: 08/04/2022  DOB: [birth year]  MRN: [id number]  ORDERING CLINICIAN: Huston Foley, MD, PhD   REFERRING CLINICIAN: Creola Corn, MD   CLINICAL INFORMATION/HISTORY: 62 year old right-handed woman with an underlying medical history of hypertension, hypothyroidism, migraine headaches, allergic rhinitis, plantar fasciitis, cervical disc disease with myelopathy, status post neck surgery on 03/19/2013, status post low back surgery in December 2033, hyperlipidemia, obesity, and DCIS with s/p b/l mastectomies, who presents for evaluation of her obstructive sleep apnea.  She is well-established on CPAP therapy of 9 cm with EPR of 3 with full compliance.  She should be eligible for a new machine.   Epworth sleepiness score: 6/24.  BMI: 40.6 kg/m  FINDINGS:   Sleep Summary:   Total Recording Time (hours, min): 7 hours, 23 min  Total Sleep Time (hours, min):  6 hours, 42 min  Percent REM (%):    21.7%   Respiratory Indices:   Calculated pAHI (per hour):  19.4/hour         REM pAHI:    34.3/hour       NREM pAHI: 15.3/hour  Central pAHI: 0.3/hour  Oxygen Saturation Statistics:    Oxygen Saturation (%) Mean: 95%   Minimum oxygen saturation (%):                 81%   O2 Saturation Range (%): 81 - 98%    O2 Saturation (minutes) <=88%: 0.5 min  Pulse Rate Statistics:   Pulse Mean (bpm):    57/min    Pulse Range (69 - 91/min)   IMPRESSION: OSA (obstructive sleep apnea), moderate  RECOMMENDATION:  This home sleep test demonstrates moderate obstructive sleep apnea with a total AHI of 19.4/hour and O2 nadir of 81%.  Moderate to loud snoring was detected fairly consistently throughout the night, at times milder.  Ongoing treatment with a positive airway pressure (PAP) device is recommended. The patient has been compliant with her CPAP of 9 cm with EPR with good tolerance and good apnea control.  She  should be eligible for a new machine which I would like to prescribe.  A full night titration study may be considered to optimize treatment settings, monitor proper oxygen saturations and aid with improvement of tolerance and adherence, if needed down the road. Alternative treatment options may include a dental device through dentistry or orthodontics in selected patients or Inspire (hypoglossal nerve stimulator) in carefully selected patients (meeting inclusion criteria).  Concomitant weight loss is recommended (where clinically appropriate). Please note that untreated obstructive sleep apnea may carry additional perioperative morbidity. Patients with significant obstructive sleep apnea should receive perioperative PAP therapy and the surgeons and particularly the anesthesiologist should be informed of the diagnosis and the severity of the sleep disordered breathing. The patient should be cautioned not to drive, work at heights, or operate dangerous or heavy equipment when tired or sleepy. Review and reiteration of good sleep hygiene measures should be pursued with any patient. Other causes of the patient's symptoms, including circadian rhythm disturbances, an underlying mood disorder, medication effect and/or an underlying medical problem cannot be ruled out based on this test. Clinical correlation is recommended.  The patient and her referring provider will be notified of the test results. The patient will be seen in follow up in sleep clinic at [facility]  I certify that I have reviewed the raw data recording prior to the issuance of this report in accordance with the standards of  the American Academy of Sleep Medicine (AASM).  INTERPRETING PHYSICIAN:   Huston Foley, MD, PhD Medical Director, [facility]) Diplomat, ABPN (Neurology and Sleep)   [facility] [address] [place], [place] [address] [phone]

## 2022-08-12 NOTE — Telephone Encounter (Addendum)
Stenson, Melissa  ,  D, CMA; Kathe Becton; Felipa Furnace, Lenon Curt, Melissa Got it. Thank you!  Bobbye Morton, CMA  Brett Fairy, Lenon Curt, Melissa New orders have been placed for the above pt, DOB: 30-Aug-2060 Thanks

## 2022-08-20 NOTE — H&P (Signed)
TOTAL KNEE ADMISSION H&P  Patient is being admitted for right total knee arthroplasty.  Subjective:  Chief Complaint: Right knee pain.  HPI: [name], 62 y.o. female has a history of pain and functional disability in the right knee due to arthritis and has failed non-surgical conservative treatments for greater than 12 weeks to include weight reduction as appropriate and activity modification. Onset of symptoms was gradual, starting  severak  years ago with gradually worsening course since that time. The patient noted no past surgery on the right knee.  Patient currently rates pain in the right knee at 8 out of 10 with activity. Patient has night pain, worsening of pain with activity and weight bearing, pain with passive range of motion, and crepitus. Patient has evidence of  bone-on-bone arthritis in the medial compartment of her right knee and near bone-on-bone patellofemoral  by imaging studies. There is no active infection.  Patient Active Problem List   Diagnosis Date Noted   Exertional dyspnea 04/17/2022   Elevated coronary artery calcium score 04/17/2022   Spondylolisthesis at L4-L5 level 12/27/2021   Ductal carcinoma in situ (DCIS) of left breast 01/19/2018   Generalized anxiety disorder 11/07/2017   Depression 11/07/2017   Insomnia 11/07/2017   Cervical spondylosis 03/19/2013    Past Medical History:  Diagnosis Date   Back pain of lumbar region with sciatica    Cancer (HCC)    breast   Diabetes mellitus, type II (HCC)    Family history of anesthesia complication    grandfather died under anesthesia 70's- had heart issues   H/O carpal tunnel repair 2014   Headache(784.0)    Hyperlipidemia    Hypertension    Hypothyroidism    Medial meniscus tear    PONV (postoperative nausea and vomiting)    Sleep apnea    cpap since 10    Past Surgical History:  Procedure Laterality Date   BILATERAL TOTAL MASTECTOMY WITH AXILLARY LYMPH NODE DISSECTION     BREAST  RECONSTRUCTION     CERVICAL DISC ARTHROPLASTY N/A 03/19/2013   Procedure: CERVICAL FIVE TO SIX, CERVICAL SIX TO SEVEN CERVICAL ANTERIOR DISC ARTHROPLASTY;  Surgeon: Barnett Abu, MD;  Location: [facility];  Service: Neurosurgery;  Laterality: N/A;  C56 C67 artificial disc replacement   CESAREAN SECTION     COLONOSCOPY     DILATATION & CURETTAGE/HYSTEROSCOPY WITH TRUECLEAR N/A 11/25/2013   Procedure: DILATATION & CURETTAGE/HYSTEROSCOPY WITH TRUCLEAR;  Surgeon: Meriel Pica, MD;  Location: [facility];  Service: Gynecology;  Laterality: N/A;   ECTOPIC PREGNANCY SURGERY     GALLBLADDER SURGERY     MOUTH SURGERY     child- dog bite   SPINAL FUSION     2023   TONSILLECTOMY     TUBAL LIGATION      Prior to Admission medications   Medication Sig Start Date End Date Taking? Authorizing Provider  ALPRAZolam Prudy Feeler) 0.5 MG tablet Take 1/2-1 tablet three times daily as needed for anxiety or insomnia 07/17/22   Corie Chiquito, PMHNP  aspirin EC 81 MG tablet Take 1 tablet (81 mg total) by mouth daily. 04/17/22   Patwardhan, Anabel Bene, MD  atorvastatin (LIPITOR) 20 MG tablet Take 1 tablet (20 mg total) by mouth daily. 04/17/22 04/12/23  Patwardhan, Anabel Bene, MD  doxazosin (CARDURA) 4 MG tablet Take 1 tablet (4 mg total) by mouth at bedtime. 08/02/22   Corie Chiquito, PMHNP  famotidine-calcium carbonate-magnesium hydroxide (PEPCID COMPLETE) 10-800-165 MG chewable tablet Chew 1 tablet by  mouth daily as needed.    [provider]  gabapentin (NEURONTIN) 100 MG capsule Take 1 capsule (100 mg total) by mouth 2 (two) times daily as needed (anxiety). 08/11/22   Corie Chiquito, PMHNP  gabapentin (NEURONTIN) 300 MG capsule Take 1 capsule (300 mg total) by mouth at bedtime. 09/16/22   Corie Chiquito, PMHNP  Galcanezumab-gnlm 120 MG/ML SOAJ Inject 120 mg into the skin every 30 (thirty) days.    [provider]  hydrochlorothiazide (MICROZIDE) 12.5 MG capsule Take 12.5 mg by mouth daily.     [provider]  ibuprofen (ADVIL) 200 MG tablet Take 1 tablet (200 mg total) by mouth every 6 (six) hours as needed. 11/25/13   Richarda Overlie, MD  JARDIANCE 10 MG TABS tablet Take 10 mg by mouth every other day. 11/13/21   [provider]  Lemborexant (DAYVIGO) 10 MG TABS Take 1 tablet (10 mg total) by mouth at bedtime. 09/28/22   Corie Chiquito, PMHNP  metFORMIN (GLUCOPHAGE) 500 MG tablet Take 500 mg by mouth daily with supper.    [provider]  metoprolol succinate (TOPROL-XL) 100 MG 24 hr tablet 1 tablet Orally Once a day for 90 days    [provider]  naproxen sodium (ALEVE) 220 MG tablet Take 220 mg by mouth.    [provider]  OnabotulinumtoxinA (BOTOX IM) Inject into the muscle every 3 (three) months. FOR MIGRAINES    [provider]  rizatriptan (MAXALT-MLT) 10 MG disintegrating tablet Take 10 mg by mouth as needed for migraine.     [provider]  Tetrahydrozoline HCl (VISINE OP) Place 1 drop into both eyes daily as needed (irritation).    [provider]  thyroid (ARMOUR) 30 MG tablet Take 30 mg by mouth daily before breakfast.     [provider]  UBRELVY 50 MG TABS Take 50 mg by mouth daily as needed (migraine). 10/31/20   [provider]  Vilazodone HCl 20 MG TABS Take 1 tablet (20 mg total) by mouth daily with breakfast. 08/02/22   Corie Chiquito, PMHNP    Allergies  Allergen Reactions   Cyclobenzaprine Hives and Other (See Comments)   Effexor [Venlafaxine Hcl]     Weight loss and cant sleep   Linaclotide Other (See Comments)   Venlafaxine Other (See Comments)    Other Reaction(s): anxiety/ extreme weight loss    Social History   Socioeconomic History   Marital status: Married    Spouse name: Rosanne Ashing   Number of children: 3   Years of education: college   Highest education level: Not on file  Occupational History   Occupation: N/A  Tobacco Use   Smoking status: Never    Smokeless tobacco: Never   Tobacco comments:    occ alcohol  Vaping Use   Vaping status: Never Used  Substance and Sexual Activity   Alcohol use: Yes    Comment: occ   Drug use: No   Sexual activity: Not Currently  Other Topics Concern   Not on file  Social History Narrative   Denies caffeine use    Social Determinants of Health   Financial Resource Strain: Not on file  Food Insecurity: No Food Insecurity (12/28/2021)   Hunger Vital Sign    Worried About Running Out of Food in the Last Year: Never true    Ran Out of Food in the Last Year: Never true  Transportation Needs: No Transportation Needs (12/28/2021)   PRAPARE - Transportation  Lack of Transportation (Medical): No    Lack of Transportation (Non-Medical): No  Physical Activity: Not on file  Stress: Not on file  Social Connections: Unknown (06/04/2021)   [facility]   Social Network    Social Network: Not on file  Intimate Partner Violence: Unknown (12/28/2021)   Humiliation, Afraid, Rape, and Kick questionnaire    Fear of Current or Ex-Partner: No    Emotionally Abused: No    Physically Abused: No    Sexually Abused: Not on file    Tobacco Use: Low Risk  (08/02/2022)   Patient History    Smoking Tobacco Use: Never    Smokeless Tobacco Use: Never    Passive Exposure: Not on file   Social History   Substance and Sexual Activity  Alcohol Use Yes   Comment: occ    Family History  Problem Relation Age of Onset   Hyperlipidemia Mother    Heart disease Father    Hypertension Father    Diabetes Father    Cancer Maternal Aunt        Breast - In 40's   Cancer Paternal Aunt        Breast - in 77's   Cancer Maternal Grandmother        breast cancer   Diabetes Paternal Grandmother    Heart disease Paternal Grandfather    Hypertension Paternal Grandfather    Suicidality Other    Sleep apnea Neg Hx     Review of Systems  Constitutional:  Negative for chills and fever.  HENT:  Negative for  congestion, sore throat and tinnitus.   Eyes:  Negative for double vision, photophobia and pain.  Respiratory:  Negative for cough, shortness of breath and wheezing.   Cardiovascular:  Negative for chest pain, palpitations and orthopnea.  Gastrointestinal:  Negative for heartburn, nausea and vomiting.  Genitourinary:  Negative for dysuria, frequency and urgency.  Musculoskeletal:  Positive for joint pain.  Neurological:  Negative for dizziness, weakness and headaches.    Objective:  Physical Exam: Well nourished and well developed.  General: Alert and oriented x3, cooperative and pleasant, no acute distress.  Head: normocephalic, atraumatic, neck supple.  Eyes: EOMI.  Musculoskeletal:  Right knee demonstrates no effusion. She has slight varus. Range 5 to 125 with moderate crepitus on range of motion. She is tender medial greater than lateral. There is no instability noted.  Calves soft and nontender. Motor function intact in LE. Strength 5/5 LE bilaterally. Neuro: Distal pulses 2+. Sensation to light touch intact in LE.   Imaging Review Plain radiographs demonstrate severe degenerative joint disease of the right knee. The overall alignment is neutral. The bone quality appears to be adequate for age and reported activity level.  Assessment/Plan:  End stage arthritis, right knee   The patient history, physical examination, clinical judgment of the provider and imaging studies are consistent with end stage degenerative joint disease of the right knee and total knee arthroplasty is deemed medically necessary. The treatment options including medical management, injection therapy arthroscopy and arthroplasty were discussed at length. The risks and benefits of total knee arthroplasty were presented and reviewed. The risks due to aseptic loosening, infection, stiffness, patella tracking problems, thromboembolic complications and other imponderables were discussed. The patient acknowledged the  explanation, agreed to proceed with the plan and consent was signed. Patient is being admitted for inpatient treatment for surgery, pain control, PT, OT, prophylactic antibiotics, VTE prophylaxis, progressive ambulation and ADLs and discharge planning. The patient  is planning to be discharged  home .   Patient's anticipated LOS is less than 2 midnights, meeting these requirements: - Younger than 67 - Lives within 1 hour of care - Has a competent adult at home to recover with post-op recover - NO history of  - Chronic pain requiring opioids  - Diabetes  - Coronary Artery Disease  - Heart failure  - Heart attack  - Stroke  - DVT/VTE  - Cardiac arrhythmia  - Respiratory Failure/COPD  - Renal failure  - Anemia  - Advanced Liver disease  Therapy Plans: Outpatient therapy at [facility] Disposition: Home with husband & children Planned DVT Prophylaxis: Xarelto 10 mg QD (hx breast CA) DME Needed: None PCP: Creola Corn, MD (clearance received) TXA: IV Allergies: Effexor (confusion, insomnia) Metal Allergy: None Anesthesia Concerns: N/V, hx lumbar fusion in Dec 2023 BMI: 39.9 Last HgbA1c: 6.4% (05/2022) - rechecking with preop labs Pain Regimen: Oxycodone (tolerated with previous lumbar surgery) Pharmacy: [facility]  Other: - No BP or IV sticks left arm  - Patient was instructed on what medications to stop prior to surgery. - Follow-up visit in 2 weeks with Dr. Lequita Halt - Begin physical therapy following surgery - Pre-operative lab work as pre-surgical testing - Prescriptions will be provided in hospital at time of discharge  Arther Abbott, PA-C Orthopedic Surgery [facility] [place]

## 2022-08-23 ENCOUNTER — Ambulatory Visit: Payer: No Typology Code available for payment source | Admitting: Professional Counselor

## 2022-08-27 NOTE — Patient Instructions (Signed)
SURGICAL WAITING ROOM VISITATION Patients having surgery or a procedure may have no more than 2 support people in the waiting area - these visitors may rotate.    Children under the age of 53 must have an adult with them who is not the patient.  If the patient needs to stay at the hospital during part of their recovery, the visitor guidelines for inpatient rooms apply. Pre-op nurse will coordinate an appropriate time for 1 support person to accompany patient in pre-op.  This support person may not rotate.    Please refer to the [facility] website for the visitor guidelines for Inpatients (after your surgery is over and you are in a regular room).       Your procedure is scheduled on: 09-09-22   Report to [facility] Main Entrance    Report to admitting at 5:50 AM   Call this number if you have problems the morning of surgery [phone]   Do not eat food :After Midnight.   After Midnight you may have the following liquids until 5:20 AM DAY OF SURGERY  Water Non-Citrus Juices (without pulp, NO RED-Apple, White grape, White cranberry) Black Coffee (NO MILK/CREAM OR CREAMERS, sugar ok)  Clear Tea (NO MILK/CREAM OR CREAMERS, sugar ok) regular and decaf                             Plain Jell-O (NO RED)                                           Fruit ices (not with fruit pulp, NO RED)                                     Popsicles (NO RED)                                                               Sports drinks like Gatorade (NO RED)                   The day of surgery:  Drink ONE (1) Pre-Surgery G2 at 5:20 AM the morning of surgery. Drink in one sitting. Do not sip.  This drink was given to you during your hospital  pre-op appointment visit. Nothing else to drink after completing the Pre-Surgery G2.          If you have questions, please contact your surgeon's office.   FOLLOW  ANY ADDITIONAL PRE OP INSTRUCTIONS YOU RECEIVED FROM YOUR SURGEON'S OFFICE!!!      Oral Hygiene is also important to reduce your risk of infection.                                    Remember - BRUSH YOUR TEETH THE MORNING OF SURGERY WITH YOUR REGULAR TOOTHPASTE   Do NOT smoke after Midnight   Take these medicines the morning of surgery with A SIP OF WATER:   Alprazolam  Armour thyroid  Atorvastatin  Gabapentin  Metoprolol  Pepcid  Vilazodone  Okay to use eyedrops, eardrops  Maxalt or Ubrelvy if needed  Stop all vitamins and herbal supplements 7 days before surgery  How to Manage Your Diabetes Before and After Surgery  Why is it important to control my blood sugar before and after surgery? Improving blood sugar levels before and after surgery helps healing and can limit problems. A way of improving blood sugar control is eating a healthy diet by:  Eating less sugar and carbohydrates  Increasing activity/exercise  Talking with your doctor about reaching your blood sugar goals High blood sugars (greater than 180 mg/dL) can raise your risk of infections and slow your recovery, so you will need to focus on controlling your diabetes during the weeks before surgery. Make sure that the doctor who takes care of your diabetes knows about your planned surgery including the date and location.  How do I manage my blood sugar before surgery? Check your blood sugar at least 4 times a day, starting 2 days before surgery, to make sure that the level is not too high or low. Check your blood sugar the morning of your surgery when you wake up and every 2 hours until you get to the Short Stay unit. If your blood sugar is less than 70 mg/dL, you will need to treat for low blood sugar: Do not take insulin. Treat a low blood sugar (less than 70 mg/dL) with  cup of clear juice (cranberry or apple), 4 glucose tablets, OR glucose gel. Recheck blood sugar in 15 minutes after treatment (to make sure it is greater than 70 mg/dL). If your blood sugar is not greater than 70 mg/dL on  recheck, call [phone] for further instructions. Report your blood sugar to the short stay nurse when you get to Short Stay.  If you are admitted to the hospital after surgery: Your blood sugar will be checked by the staff and you will probably be given insulin after surgery (instead of oral diabetes medicines) to make sure you have good blood sugar levels. The goal for blood sugar control after surgery is 80-180 mg/dL.   WHAT DO I DO ABOUT MY DIABETES MEDICATION?  Do not take oral diabetes medicines (pills) the morning of surgery.  Hold Jardiance 3 days before surgery   DO NOT TAKE THE FOLLOWING 7 DAYS PRIOR TO SURGERY: Ozempic, Wegovy, Rybelsus (Semaglutide), Byetta (exenatide), Bydureon (exenatide ER), Victoza, Saxenda (liraglutide), or Trulicity (dulaglutide) Mounjaro (Tirzepatide) Adlyxin (Lixisenatide), Polyethylene Glycol Loxenatide.  Reviewed and Endorsed by [facility] Patient Education Committee, August 2015  Bring CPAP mask and tubing day of surgery.                              You may not have any metal on your body including hair pins, jewelry, and body piercing             Do not wear make-up, lotions, powders, perfumes or deodorant  Do not wear nail polish including gel and S&S, artificial/acrylic nails, or any other type of covering on natural nails including finger and toenails. If you have artificial nails, gel coating, etc. that needs to be removed by a nail salon please have this removed prior to surgery or surgery may need to be canceled/ delayed if the surgeon/ anesthesia feels like they are unable to be safely monitored.   Do not shave  48 hours prior to surgery.  Do not bring valuables to the hospital. [facility] IS NOT RESPONSIBLE   FOR VALUABLES.   Contacts, dentures or bridgework may not be worn into surgery.   Bring small overnight bag day of surgery.   DO NOT BRING YOUR HOME MEDICATIONS TO THE HOSPITAL. PHARMACY WILL DISPENSE MEDICATIONS  LISTED ON YOUR MEDICATION LIST TO YOU DURING YOUR ADMISSION IN THE HOSPITAL!   Special Instructions: Bring a copy of your healthcare power of attorney and living will documents the day of surgery if you haven't scanned them before.              Please read over the following fact sheets you were given: IF YOU HAVE QUESTIONS ABOUT YOUR PRE-OP INSTRUCTIONS PLEASE CALL [phone] Rosey Bath   If you received a COVID test during your pre-op visit  it is requested that you wear a mask when out in public, stay away from anyone that may not be feeling well and notify your surgeon if you develop symptoms. If you test positive for Covid or have been in contact with anyone that has tested positive in the last 10 days please notify you surgeon.  [facility] - Preparing for Surgery Before surgery, you can play an important role.  Because skin is not sterile, your skin needs to be as free of germs as possible.  You can reduce the number of germs on your skin by washing with CHG (chlorahexidine gluconate) soap before surgery.  CHG is an antiseptic cleaner which kills germs and bonds with the skin to continue killing germs even after washing. Please DO NOT use if you have an allergy to CHG or antibacterial soaps.  If your skin becomes reddened/irritated stop using the CHG and inform your nurse when you arrive at Short Stay. Do not shave (including legs and underarms) for at least 48 hours prior to the first CHG shower.  You may shave your face/neck.  Please follow these instructions carefully:  1.  Shower with CHG Soap the night before surgery and the  morning of surgery.  2.  If you choose to wash your hair, wash your hair first as usual with your normal  shampoo.  3.  After you shampoo, rinse your hair and body thoroughly to remove the shampoo.                             4.  Use CHG as you would any other liquid soap.  You can apply chg directly to the skin and wash.  Gently with a scrungie or clean  washcloth.  5.  Apply the CHG Soap to your body ONLY FROM THE NECK DOWN.   Do   not use on face/ open                           Wound or open sores. Avoid contact with eyes, ears mouth and   genitals (private parts).                       Wash face,  Genitals (private parts) with your normal soap.             6.  Wash thoroughly, paying special attention to the area where your    surgery  will be performed.  7.  Thoroughly rinse your body with warm water from the neck down.  8.  DO NOT shower/wash with your  normal soap after using and rinsing off the CHG Soap.                9.  Pat yourself dry with a clean towel.            10.  Wear clean pajamas.            11.  Place clean sheets on your bed the night of your first shower and do not  sleep with pets. Day of Surgery : Do not apply any lotions/deodorants the morning of surgery.  Please wear clean clothes to the hospital/surgery center.  FAILURE TO FOLLOW THESE INSTRUCTIONS MAY RESULT IN THE CANCELLATION OF YOUR SURGERY  PATIENT SIGNATURE_________________________________  NURSE SIGNATURE__________________________________  ________________________________________________________________________    [name]  An incentive spirometer is a tool that can help keep your lungs clear and active. This tool measures how well you are filling your lungs with each breath. Taking long deep breaths may help reverse or decrease the chance of developing breathing (pulmonary) problems (especially infection) following: A long period of time when you are unable to move or be active. BEFORE THE PROCEDURE  If the spirometer includes an indicator to show your best effort, your nurse or respiratory therapist will set it to a desired goal. If possible, sit up straight or lean slightly forward. Try not to slouch. Hold the incentive spirometer in an upright position. INSTRUCTIONS FOR USE  Sit on the edge of your bed if possible, or sit up as far as  you can in bed or on a chair. Hold the incentive spirometer in an upright position. Breathe out normally. Place the mouthpiece in your mouth and seal your lips tightly around it. Breathe in slowly and as deeply as possible, raising the piston or the ball toward the top of the column. Hold your breath for 3-5 seconds or for as long as possible. Allow the piston or ball to fall to the bottom of the column. Remove the mouthpiece from your mouth and breathe out normally. Rest for a few seconds and repeat Steps 1 through 7 at least 10 times every 1-2 hours when you are awake. Take your time and take a few normal breaths between deep breaths. The spirometer may include an indicator to show your best effort. Use the indicator as a goal to work toward during each repetition. After each set of 10 deep breaths, practice coughing to be sure your lungs are clear. If you have an incision (the cut made at the time of surgery), support your incision when coughing by placing a pillow or rolled up towels firmly against it. Once you are able to get out of bed, walk around indoors and cough well. You may stop using the incentive spirometer when instructed by your caregiver.  RISKS AND COMPLICATIONS Take your time so you do not get dizzy or light-headed. If you are in pain, you may need to take or ask for pain medication before doing incentive spirometry. It is harder to take a deep breath if you are having pain. AFTER USE Rest and breathe slowly and easily. It can be helpful to keep track of a log of your progress. Your caregiver can provide you with a simple table to help with this. If you are using the spirometer at home, follow these instructions: SEEK MEDICAL CARE IF:  You are having difficultly using the spirometer. You have trouble using the spirometer as often as instructed. Your pain medication is not giving enough relief while  using the spirometer. You develop fever of 100.5 F (38.1 C) or higher. SEEK  IMMEDIATE MEDICAL CARE IF:  You cough up bloody sputum that had not been present before. You develop fever of 102 F (38.9 C) or greater. You develop worsening pain at or near the incision site. MAKE SURE YOU:  Understand these instructions. Will watch your condition. Will get help right away if you are not doing well or get worse. Document Released: 05/20/2006 Document Revised: 04/01/2011 Document Reviewed: 07/21/2006 [facility] Patient Information 2014 [place], [place].   ________________________________________________________________________

## 2022-08-28 ENCOUNTER — Encounter (HOSPITAL_COMMUNITY)
Admission: RE | Admit: 2022-08-28 | Discharge: 2022-08-28 | Disposition: A | Discharge status: Home / Self Care | Payer: No Typology Code available for payment source | Source: Ambulatory Visit | Attending: Orthopedic Surgery | Admitting: Orthopedic Surgery

## 2022-08-28 ENCOUNTER — Encounter (HOSPITAL_COMMUNITY): Payer: Self-pay

## 2022-08-28 ENCOUNTER — Other Ambulatory Visit: Payer: Self-pay

## 2022-08-28 VITALS — BP 135/67 | HR 71 | Temp 97.6°F | Resp 18 | Ht 63.0 in | Wt 215.0 lb

## 2022-08-28 DIAGNOSIS — E119 Type 2 diabetes mellitus without complications: Secondary | ICD-10-CM | POA: Diagnosis not present

## 2022-08-28 DIAGNOSIS — Z01818 Encounter for other preprocedural examination: Secondary | ICD-10-CM

## 2022-08-28 DIAGNOSIS — Z01812 Encounter for preprocedural laboratory examination: Secondary | ICD-10-CM | POA: Diagnosis not present

## 2022-08-28 DIAGNOSIS — I10 Essential (primary) hypertension: Secondary | ICD-10-CM | POA: Diagnosis not present

## 2022-08-28 HISTORY — DX: Depression, unspecified: F32.A

## 2022-08-28 HISTORY — DX: Unspecified osteoarthritis, unspecified site: M19.90

## 2022-08-28 LAB — BASIC METABOLIC PANEL
Anion gap: 9 (ref 5–15)
BUN: 19 mg/dL (ref 8–23)
CO2: 25 mmol/L (ref 22–32)
Calcium: 9.4 mg/dL (ref 8.9–10.3)
Chloride: 105 mmol/L (ref 98–111)
Creatinine, Ser: 0.63 mg/dL (ref 0.44–1.00)
GFR, Estimated: >60 mL/min (ref >60)
Glucose, Bld: 111 mg/dL — ABNORMAL HIGH (ref 70–99)
Potassium: 3.9 mmol/L (ref 3.5–5.1)
Sodium: 139 mmol/L (ref 135–145)

## 2022-08-28 LAB — CBC
HCT: 46.9 % — ABNORMAL HIGH (ref 36.0–46.0)
Hemoglobin: 15.5 g/dL — ABNORMAL HIGH (ref 12.0–15.0)
MCH: 28.4 pg (ref 26.0–34.0)
MCHC: 33 g/dL (ref 30.0–36.0)
MCV: 85.9 fL (ref 80.0–100.0)
Platelets: 234 10*3/uL (ref 150–400)
RBC: 5.46 MIL/uL — ABNORMAL HIGH (ref 3.87–5.11)
RDW: 14 % (ref 11.5–15.5)
WBC: 6.8 10*3/uL (ref 4.0–10.5)
nRBC: 0 % (ref 0.0–0.2)

## 2022-08-28 LAB — HEMOGLOBIN A1C
Hgb A1c MFr Bld: 5.6 % (ref 4.8–5.6)
Mean Plasma Glucose: 114.02 mg/dL

## 2022-08-28 LAB — GLUCOSE, CAPILLARY: Glucose-Capillary: 131 mg/dL — ABNORMAL HIGH (ref 70–99)

## 2022-08-28 NOTE — Progress Notes (Signed)
COVID Vaccine received:  []  No [x]  Yes Date of any COVID positive Test in last 90 days: No PCP - Creola Corn MD Cardiologist - Truett Mainland MD  Chest x-ray -  EKG -  04/17/22 EPIC Stress Test - 05/06/22 EPIC ECHO -05/16/22 EPIC  Cardiac Cath -   Bowel Prep - [x]  No  []   Yes ______  Pacemaker / ICD device [x]  No []  Yes   Spinal Cord Stimulator:[x]  No []  Yes       History of Sleep Apnea? []  No [x]  Yes   CPAP used?- []  No [x]  Yes    Does the patient monitor blood sugar?          []  No [x]  Yes  []  N/A  Patient has: []  NO Hx DM   []  Pre-DM                 []  DM1  [x]   DM2 Does patient have a [employer] or [employer]acom? []  No []  Yes   Fasting Blood Sugar Ranges- 100-107 Checks Blood Sugar ___3__ times a week  GLP1 agonist / usual dose - No GLP1 instructions:  SGLT-2 inhibitors / usual dose - No SGLT-2 instructions:   Blood Thinner / Instructions:No Aspirin Instructions: 81mg  ASA. Pt. Was instructed by MD to stop 5 days Prior to surgery Comments:   Activity level: Patient is able  to climb a flight of stairs without difficulty; [x]  No CP  [x]  No SOB   Patient can perform ADLs without assistance.   Anesthesia review: OSA,HTN, DM, poor R wave progressiom  Patient denies shortness of breath, fever, cough and chest pain at PAT appointment.  Patient verbalized understanding and agreement to the Pre-Surgical Instructions that were given to them at this PAT appointment. Patient was also educated of the need to review these PAT instructions again prior to his/her surgery.I reviewed the appropriate phone numbers to call if they have any and questions or concerns.

## 2022-08-30 ENCOUNTER — Encounter: Payer: Self-pay | Admitting: Professional Counselor

## 2022-08-30 ENCOUNTER — Ambulatory Visit (INDEPENDENT_AMBULATORY_CARE_PROVIDER_SITE_OTHER): Payer: No Typology Code available for payment source | Admitting: Professional Counselor

## 2022-08-30 DIAGNOSIS — F411 Generalized anxiety disorder: Secondary | ICD-10-CM | POA: Diagnosis not present
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
## 2022-09-03 ENCOUNTER — Encounter (HOSPITAL_COMMUNITY): Payer: Self-pay

## 2022-09-03 NOTE — Progress Notes (Signed)
Case: [id number] Date/Time: 09/09/22 0805   Procedure: TOTAL KNEE ARTHROPLASTY (Right: Knee)   Anesthesia type: Choice   Pre-op diagnosis: right knee osteoarthritis   Location: [facility] ROOM 10 / [facility]   Surgeons: Ollen Gross, MD       DISCUSSION: [name] is a 62 year old female who presents to PAT prior to surgery above.  Past medical history significant for anxiety, depression, migraines, hypertension, hypothyroidism, OSA (on CPAP), history of breast cancer status post bilateral total mastectomy, diabetes, chronic neck and back pain status post cervical disc arthralgia plasty (in 2015) and lumbar fusion (in 2023).  Prior anesthesia complications include PONV  Patient was evaluated by cardiology on 04/17/2022 after she reported to her PCP she was having chest pain and DOE.  She underwent a nuclear medicine stress test which was low risk and echocardiogram which showed EF of 56% with no significant valvular abnormality.  It was suspected some of her symptoms may be due to deconditioning and was encouraged to increase physical activity.  She was advised to continue a statin and follow-up with cardiology as needed   VS: BP 135/67   Pulse 71   Temp 36.4 C (Oral)   Resp 18   Ht 5\' 3"  (1.6 m)   Wt 97.5 kg   LMP 11/12/2013   SpO2 97%   BMI 38.09 kg/m   PROVIDERS: Creola Corn, MD   LABS: Labs reviewed: Acceptable for surgery. (all labs ordered are listed, but only abnormal results are displayed)  Labs Reviewed  BASIC METABOLIC PANEL - Abnormal; Notable for the following components:      Result Value   Glucose, Bld 111 (*)    All other components within normal limits  CBC - Abnormal; Notable for the following components:   RBC 5.46 (*)    Hemoglobin 15.5 (*)    HCT 46.9 (*)    All other components within normal limits  GLUCOSE, CAPILLARY - Abnormal; Notable for the following components:   Glucose-Capillary 131 (*)    All other components within normal limits  SURGICAL PCR  SCREEN  HEMOGLOBIN A1C     IMAGES:   EKG 04/17/2022: Sinus rhythm 78 bpm Poor R-wave progression Otherwise normal EKG   CV:   Echocardiogram 05/16/2022:  Left ventricle cavity is normal in size. Mild concentric hypertrophy of  the left ventricle. Normal global wall motion. Normal LV systolic function  with EF 56%. Normal diastolic filling pattern.  No significant valvular abnormality.  No evidence of pulmonary hypertension.  Compared to previous study in 2021, moderate LA dilatation absent.    Lexiscan Sestamibi stress test 05/06/2022: Lexiscan nuclear stress test performed using 1-day protocol. Normal myocardial perfusion. Stress LVEF 59%. Low risk study.    Past Medical History:  Diagnosis Date   Arthritis    Back pain of lumbar region with sciatica    Cancer (HCC)    breast   Depression    Diabetes mellitus, type II (HCC)    Family history of anesthesia complication    grandfather died under anesthesia 70's- had heart issues   H/O carpal tunnel repair 2014   Headache(784.0)    Hyperlipidemia    Hypertension    Hypothyroidism    Medial meniscus tear    PONV (postoperative nausea and vomiting)    Sleep apnea    cpap since 10    Past Surgical History:  Procedure Laterality Date   BILATERAL TOTAL MASTECTOMY WITH AXILLARY LYMPH NODE DISSECTION     BREAST RECONSTRUCTION  CERVICAL DISC ARTHROPLASTY N/A 03/19/2013   Procedure: CERVICAL FIVE TO SIX, CERVICAL SIX TO SEVEN CERVICAL ANTERIOR DISC ARTHROPLASTY;  Surgeon: Barnett Abu, MD;  Location: [facility];  Service: Neurosurgery;  Laterality: N/A;  C56 C67 artificial disc replacement   CESAREAN SECTION     COLONOSCOPY     DILATATION & CURETTAGE/HYSTEROSCOPY WITH TRUECLEAR N/A 11/25/2013   Procedure: DILATATION & CURETTAGE/HYSTEROSCOPY WITH TRUCLEAR;  Surgeon: Meriel Pica, MD;  Location: [facility];  Service: Gynecology;  Laterality: N/A;   ECTOPIC PREGNANCY SURGERY     GALLBLADDER SURGERY     MOUTH  SURGERY     child- dog bite   SPINAL FUSION     2023   TONSILLECTOMY     TUBAL LIGATION      MEDICATIONS:  ALPRAZolam (XANAX) 0.5 MG tablet   aspirin EC 81 MG tablet   atorvastatin (LIPITOR) 20 MG tablet   doxazosin (CARDURA) 4 MG tablet   famotidine-calcium carbonate-magnesium hydroxide (PEPCID COMPLETE) 10-800-165 MG chewable tablet   gabapentin (NEURONTIN) 100 MG capsule   [START ON 09/16/2022] gabapentin (NEURONTIN) 300 MG capsule   Galcanezumab-gnlm 120 MG/ML SOAJ   GOODSENSE PSYLLIUM FIBER PO   hydrochlorothiazide (MICROZIDE) 12.5 MG capsule   JARDIANCE 10 MG TABS tablet   [START ON 09/28/2022] Lemborexant (DAYVIGO) 10 MG TABS   Metamucil Fiber CHEW   metFORMIN (GLUCOPHAGE) 500 MG tablet   methocarbamol (ROBAXIN) 500 MG tablet   metoprolol succinate (TOPROL-XL) 100 MG 24 hr tablet   mometasone (ELOCON) 0.1 % lotion   Naphazoline-Pheniramine (OPCON-A) 0.027-0.315 % SOLN   naproxen sodium (ALEVE) 220 MG tablet   Nutritional Supplements (OSTEO ADVANCE PO)   OnabotulinumtoxinA (BOTOX IM)   rizatriptan (MAXALT-MLT) 10 MG disintegrating tablet   Tetrahydrozoline HCl (VISINE OP)   thyroid (ARMOUR) 30 MG tablet   tiZANidine (ZANAFLEX) 4 MG tablet   UBRELVY 50 MG TABS   Vilazodone HCl 20 MG TABS   No current facility-administered medications for this encounter.    ondansetron (ZOFRAN) 4 mg in sodium chloride 0.9 % 50 mL IVPB   Marcille Blanco [facility] Short Stay/Anesthesiology [facility] Phone [phone] 09/03/2022 10:29 AM

## 2022-09-03 NOTE — Anesthesia Preprocedure Evaluation (Addendum)
Anesthesia Evaluation  Patient identified by MRN, date of birth, ID band Patient awake    Reviewed: Allergy & Precautions, NPO status , Patient's Chart, lab work & pertinent test results  History of Anesthesia Complications (+) PONV and history of anesthetic complications  Airway Mallampati: II  TM Distance: >3 FB Neck ROM: Full    Dental  (+) Teeth Intact, Dental Advisory Given   Pulmonary sleep apnea    breath sounds clear to auscultation       Cardiovascular hypertension, Pt. on medications and Pt. on home beta blockers + CAD   Rhythm:Regular Rate:Normal     Neuro/Psych  Headaches PSYCHIATRIC DISORDERS Anxiety Depression     Neuromuscular disease    GI/Hepatic Neg liver ROS,GERD  Medicated,,  Endo/Other  diabetes, Type 2, Oral Hypoglycemic AgentsHypothyroidism    Renal/GU negative Renal ROS     Musculoskeletal  (+) Arthritis ,    Abdominal   Peds  Hematology negative hematology ROS (+)   Anesthesia Other Findings   Reproductive/Obstetrics                             Anesthesia Physical Anesthesia Plan  ASA: 3  Anesthesia Plan: General   Post-op Pain Management: Regional block*   Induction: Intravenous  PONV Risk Score and Plan: 4 or greater and Ondansetron, Dexamethasone, Midazolam, Propofol infusion and TIVA  Airway Management Planned: LMA  Additional Equipment: None  Intra-op Plan:   Post-operative Plan: Extubation in OR  Informed Consent: I have reviewed the patients History and Physical, chart, labs and discussed the procedure including the risks, benefits and alternatives for the proposed anesthesia with the patient or authorized representative who has indicated his/her understanding and acceptance.     Dental advisory given  Plan Discussed with: CRNA  Anesthesia Plan Comments: (See PAT note from 8/7 by Sherlie Ban PA-C  Pt request general due to spine  hardware.  Lab Results      Component                Value               Date                      WBC                      6.8                 08/28/2022                HGB                      15.5 (H)            08/28/2022                HCT                      46.9 (H)            08/28/2022                MCV                      85.9                08/28/2022  PLT                      234                 08/28/2022           )        Anesthesia Quick Evaluation

## 2022-09-05 ENCOUNTER — Encounter: Payer: Self-pay | Admitting: Physical Therapy

## 2022-09-05 ENCOUNTER — Encounter
Payer: No Typology Code available for payment source | Attending: Obstetrics and Gynecology | Admitting: Physical Therapy

## 2022-09-05 DIAGNOSIS — N3941 Urge incontinence: Secondary | ICD-10-CM | POA: Diagnosis present

## 2022-09-05 DIAGNOSIS — M6281 Muscle weakness (generalized): Secondary | ICD-10-CM | POA: Insufficient documentation

## 2022-09-05 NOTE — Therapy (Signed)
OUTPATIENT PHYSICAL THERAPY TREATMENT NOTE   Patient Name: [name] MRN: [id number] DOB:Oct 28, 1960, 62 y.o., female Today's Date: 09/05/2022  PCP: Creola Corn, MD  REFERRING PROVIDER:  Marguerita Beards, MD   END OF SESSION:   PT End of Session - 09/05/22 0948     Visit Number 13    Date for PT Re-Evaluation 10/07/22    Authorization Type [employer]    PT Start Time 0945    PT Stop Time 1023    PT Time Calculation (min) 38 min    Activity Tolerance Patient tolerated treatment well    Behavior During Therapy WFL for tasks assessed/performed             Past Medical History:  Diagnosis Date   Arthritis    Back pain of lumbar region with sciatica    Cancer (HCC)    breast   Depression    Diabetes mellitus, type II (HCC)    Family history of anesthesia complication    grandfather died under anesthesia 70's- had heart issues   H/O carpal tunnel repair 2014   Headache(784.0)    Hyperlipidemia    Hypertension    Hypothyroidism    Medial meniscus tear    PONV (postoperative nausea and vomiting)    Sleep apnea    cpap since 10   Past Surgical History:  Procedure Laterality Date   BILATERAL TOTAL MASTECTOMY WITH AXILLARY LYMPH NODE DISSECTION     BREAST RECONSTRUCTION     CERVICAL DISC ARTHROPLASTY N/A 03/19/2013   Procedure: CERVICAL FIVE TO SIX, CERVICAL SIX TO SEVEN CERVICAL ANTERIOR DISC ARTHROPLASTY;  Surgeon: Barnett Abu, MD;  Location: [facility];  Service: Neurosurgery;  Laterality: N/A;  C56 C67 artificial disc replacement   CESAREAN SECTION     COLONOSCOPY     DILATATION & CURETTAGE/HYSTEROSCOPY WITH TRUECLEAR N/A 11/25/2013   Procedure: DILATATION & CURETTAGE/HYSTEROSCOPY WITH TRUCLEAR;  Surgeon: Meriel Pica, MD;  Location: [facility];  Service: Gynecology;  Laterality: N/A;   ECTOPIC PREGNANCY SURGERY     GALLBLADDER SURGERY     MOUTH SURGERY     child- dog bite   SPINAL FUSION     2023   TONSILLECTOMY     TUBAL LIGATION     Patient  Active Problem List   Diagnosis Date Noted   Exertional dyspnea 04/17/2022   Elevated coronary artery calcium score 04/17/2022   Spondylolisthesis at L4-L5 level 12/27/2021   Ductal carcinoma in situ (DCIS) of left breast 01/19/2018   Generalized anxiety disorder 11/07/2017   Depression 11/07/2017   Insomnia 11/07/2017   Cervical spondylosis 03/19/2013   REFERRING DIAG: F81.017 (ICD-10-CM) - Levator spasm    THERAPY DIAG:  M62.81 Muscle weakness (generalized) N39.41 Urgency Incontinence   Rationale for Evaluation and Treatment: Rehabilitation   ONSET DATE: 2021   SUBJECTIVE:  SUBJECTIVE STATEMENT: I am suppose to have a total knee surgery on 09/09/22. I am having increased trouble with the other knee. My back is doing really good. I had one leakage since last visit.   PAIN:  Are you having pain? Yes NPRS scale: 3/10 Pain location:  back   Pain type: pins and needles Pain description: constant    Aggravating factors: moving, sitting  Relieving factors: rest   PRECAUTIONS: Other: recent lumbar fusion 12/23   WEIGHT BEARING RESTRICTIONS: No   FALLS:  Has patient fallen in last 6 months? No   LIVING ENVIRONMENT: Lives with: lives with their daughter   OCCUPATION: recovering from back surgery   PLOF: Independent   PATIENT GOALS: reduce urinary leakage, not have to clean up bathroom in middle of night due to accident   PERTINENT HISTORY:  Breast cancer; DM; Hypothyroidism; sleep apnea; bilateral total mastectomy; c-section; cervical disc arthroplasty; Gallbladder removal; L3-L5 spinal fusion 01/16/22 Sexual abuse: No   BOWEL MOVEMENT: Pain with bowel movement: No Type of bowel movement:Type (Bristol Stool Scale) varies, Frequency every 2-3 days, Strain Yes, and Splinting yes Fully  empty rectum: No due to back surgery  Leakage: No Fiber supplement: Yes: stool softner   URINATION: Pain with urination: No Fully empty bladder: No, Has to lean forward or backward to empty completely ;  dribbling after finishing, and the need to urinate multiple times in a row  Stream: Strong Urgency: Yes:   patient does not wake up in time to go to the bathroom and still recovering from back surger.  Frequency: Day time voids 6.  Nocturia: 1-2 times per night to void.  Leakage: Walking to the bathroom,  patient gets the urge to urinate in standing and not sitting and has improved by 40 %.  Pads: No   INTERCOURSE: not sexually active   PREGNANCY: C-section deliveries 3     OBJECTIVE:    DIAGNOSTIC FINDINGS:  Pelvic floor strength I/V   PVR of 22 ml was obtained by bladder scan      COGNITION: Overall cognitive status: Within functional limits for tasks assessed                          SENSATION: Light touch: Appears intact Proprioception: Appears intact     GAIT: Distance walked: 200 feet Assistive device utilized: Single point cane Level of assistance: Complete Independence Comments: recovering from back fusion   POSTURE: rounded shoulders, forward head, and decreased lumbar lordosis   PELVIC ALIGNMENT:   LUMBARAROM/PROM: not tested due to recent back surgery     LOWER EXTREMITY ROM:   Passive ROM Right eval Left eval Right 07/02/22 Left  07/02/22 Right 07/16/22 Left  07/16/22  Hip flexion 115 110 115 115 115 115  Hip internal rotation -5 0 15 10 15 15   Hip external rotation 70 70 70 70 80 80   (Blank rows = not tested)         PALPATION:   General  unable to tighten her lower abdominals, tenderness located in the lower abdominal, restrictions of the c-section scar                 External Perineal Exam vaginal dryness                             Internal Pelvic Floor tenderness located on bilateral levator ani, obturator internist, sides of the  urethra and bladder   Patient confirms identification and approves PT to assess internal pelvic floor and treatment Yes   PELVIC MMT:   MMT eval 05/16/22 05/30/22 06/11/22  Vaginal 3/5 anterior and posterior 2/5 laterally with right side weaker 3/5 with circular contraction 4/5 with good lift and circular contraction 4/5 in supine  (Blank rows = not tested)         TONE: increased     TODAY'S TREATMENT:  Exercises: Strengthening: Supine pelvic floor contraction holding for 10 sec 10 x Supine pelvic floor contraction quick flicks 10 x with verbal cues to contract the abdominals correctly Sit to stand with pelvic floor contraction 5 times Supine knee fall our with pelvic floor contraction   08/01/22 Exercises: Strengthening: ( abdominal and pelvic floor contraction) Supine hip abduction with green band 15 X each side Marching 10 x each side with abdominal contraction with yellow band Supine foot on ball and roll back and forth 15 x each leg Supine ball squeeze with yellow band around wrist and moving arm up and down 15 x    07/16/22 Manual: Soft tissue mobilization: Manual work to the lower abdomen to reduce the tenderness Scar tissue mobilization: Manual work to the scar moving in different directions, holding position for 30 seconds to stretch the scar tissue Myofascial release: Release of the mesenteric root and the right lower abdominal area where there is tight tissue.      PATIENT EDUCATION: 09/05/22 Education details: Access Code: [email or web address] Person educated: Patient Education method: Explanation, Demonstration, Tactile cues, Verbal cues, and Handouts Education comprehension: verbalized understanding, returned demonstration, verbal cues required, tactile cues required, and needs further education     HOME EXERCISE PROGRAM: 09/05/22 Access Code: [facility] URL: [email or web address] Date: 09/05/2022 Prepared by: Eulis Foster  Exercises - - Supine March  -  1 x daily - 7 x weekly - 1 sets - 10 reps - Sit to Stand with Pelvic Floor Contraction  - 1 x daily - 7 x weekly - 1 sets - 5 reps - Bent Knee Fallouts  - 1 x daily - 4 x weekly - 1 sets - 10 reps - Seated Pelvic Floor Contraction  - 3 x daily - 7 x weekly - 1 sets - 10 reps - 10 se hold - Seated Quick Flick Pelvic Floor Contractions  - 1 x daily - 7 x weekly - 3 sets - 10 reps   ASSESSMENT:   CLINICAL IMPRESSION: Patient is a 62 y.o. female who was seen today for physical therapy  treatment for levator ani spasm.  No urinary leakage since last visit.  Patient gets the urge to urinate in standing and not sitting and has improved by 40 %. Patient is able to wait longer to go to the bathroom. Patient has had one leakage since last visit when she was getting up to go to the bathroom and leaked on the way.  Patient does not bend forward to empty her bladder but has to consciously empty her bladder. Patient is able to feel when she leaks urine. Patient is going to have a right TKR on 09/09/22.  Patient understands exercises she can do as she recovers from her surgery. If she still has issues with leakage after her recovery from TKR then she can return to therapy.   OBJECTIVE IMPAIRMENTS: decreased endurance, decreased ROM, decreased strength, increased fascial restrictions, and increased muscle spasms.     ACTIVITY LIMITATIONS: sitting, standing, transfers, continence, and locomotion level   PARTICIPATION LIMITATIONS:  meal prep, cleaning, laundry, and community activity   PERSONAL FACTORS: Age, Past/current experiences, Time since onset of injury/illness/exacerbation, and 3+ comorbidities: Breast cancer; DM; Hypothyroidism; sleep apnea; bilateral total mastectomy; c-section; cervical disc arthroplasty; Gallbladder removal; L3-L5 spinal fusion 01/16/22  are also affecting patient's functional outcome.    REHAB POTENTIAL: Good   CLINICAL DECISION MAKING: Evolving/moderate complexity   EVALUATION  COMPLEXITY: Moderate     GOALS: Goals reviewed with patient? Yes   SHORT TERM GOALS: Target date: 05/20/22   Patient is independent with initial HEP  for c-section scar massage to improve tissue mobility.  Baseline: Goal status: Met 05/09/22   2.  Patient is independent with initial HEP for hip mobility and pelvic floor drop to lengthen the muscles.  Baseline:  Goal status: Met 06/18/22   3.  Patient able to perform diaphragmatic breathing to relax her pelvic floor.  Baseline:  Goal status: Met 05/21/22     LONG TERM GOALS: Target date: 10/07/22   Patient independent with advanced HEP for pelvic floor strengthening and elongation to reduce her urinary leakage.  Baseline:  Goal status: Met 09/05/22   2.  Pelvic floor strength is >/= 3/5 with circular hug of therapist finger to reduce her urinary leakage.  Baseline:  Goal status: Met 5.9.24   3.  Patient is able to wake up in the middle of the night and walk to the bathroom without leaking urine due to improve pelvic floor coordination.  Baseline:  Goal status: Met 06/11/22   4.   Urinary leakage with coughing, sneezing and laughing is reduced >/= 75% due to improved pelvic floor strength and coordination.  Baseline: not since  Goal status: Met 09/05/22     5.  Bilateral hip flexion P/ROM is >/= 115 degrees so she is able to sit on the commode and fully empty her bladder.  Baseline: Hip flexion is 115 degrees, takes 2 times to fully empty and uses her abdominals,She will contract the pelvic floor to empty further Goal status:  Partially met 09/05/22     PLAN: Discharge to HEP due to having surgery to her right knee on 09/09/22    Eulis Foster, PT 09/05/22 10:30 AM   PHYSICAL THERAPY DISCHARGE SUMMARY  Visits from Start of Care: 13  Current functional level related to goals / functional outcomes: See above.    Remaining deficits: See above.    Education / Equipment: HEP   Patient agrees to discharge. Patient goals  were partially met. Patient is being discharged due to  improvement and will have TKR on 09/09/22. Thank you for the referral.   Eulis Foster, PT 09/05/22 10:30 AM

## 2022-09-06 ENCOUNTER — Ambulatory Visit (INDEPENDENT_AMBULATORY_CARE_PROVIDER_SITE_OTHER): Payer: No Typology Code available for payment source | Admitting: Professional Counselor

## 2022-09-06 DIAGNOSIS — F411 Generalized anxiety disorder: Secondary | ICD-10-CM

## 2022-09-09 ENCOUNTER — Ambulatory Visit (HOSPITAL_COMMUNITY): Payer: No Typology Code available for payment source | Admitting: Medical

## 2022-09-09 ENCOUNTER — Encounter (HOSPITAL_COMMUNITY): Payer: Self-pay | Admitting: Orthopedic Surgery

## 2022-09-09 ENCOUNTER — Observation Stay (HOSPITAL_COMMUNITY)
Admission: RE | Admit: 2022-09-09 | Discharge: 2022-09-10 | Disposition: A | Discharge status: Home / Self Care | Payer: No Typology Code available for payment source | Source: Ambulatory Visit | Attending: Orthopedic Surgery | Admitting: Orthopedic Surgery

## 2022-09-09 ENCOUNTER — Other Ambulatory Visit: Payer: Self-pay

## 2022-09-09 ENCOUNTER — Encounter (HOSPITAL_COMMUNITY)
Admission: RE | Disposition: A | Discharge status: Home / Self Care | Payer: Self-pay | Source: Ambulatory Visit | Attending: Orthopedic Surgery

## 2022-09-09 ENCOUNTER — Ambulatory Visit (HOSPITAL_COMMUNITY): Payer: No Typology Code available for payment source | Admitting: Anesthesiology

## 2022-09-09 DIAGNOSIS — I10 Essential (primary) hypertension: Secondary | ICD-10-CM

## 2022-09-09 DIAGNOSIS — Z79899 Other long term (current) drug therapy: Secondary | ICD-10-CM | POA: Diagnosis not present

## 2022-09-09 DIAGNOSIS — E039 Hypothyroidism, unspecified: Secondary | ICD-10-CM | POA: Insufficient documentation

## 2022-09-09 DIAGNOSIS — M1710 Unilateral primary osteoarthritis, unspecified knee: Secondary | ICD-10-CM

## 2022-09-09 DIAGNOSIS — M1711 Unilateral primary osteoarthritis, right knee: Principal | ICD-10-CM

## 2022-09-09 DIAGNOSIS — M179 Osteoarthritis of knee, unspecified: Secondary | ICD-10-CM | POA: Diagnosis present

## 2022-09-09 DIAGNOSIS — Z7984 Long term (current) use of oral hypoglycemic drugs: Secondary | ICD-10-CM | POA: Insufficient documentation

## 2022-09-09 DIAGNOSIS — E119 Type 2 diabetes mellitus without complications: Secondary | ICD-10-CM | POA: Insufficient documentation

## 2022-09-09 DIAGNOSIS — M170 Bilateral primary osteoarthritis of knee: Secondary | ICD-10-CM | POA: Diagnosis present

## 2022-09-09 DIAGNOSIS — Z853 Personal history of malignant neoplasm of breast: Secondary | ICD-10-CM | POA: Insufficient documentation

## 2022-09-09 DIAGNOSIS — Z7982 Long term (current) use of aspirin: Secondary | ICD-10-CM | POA: Diagnosis not present

## 2022-09-09 DIAGNOSIS — Z01818 Encounter for other preprocedural examination: Secondary | ICD-10-CM

## 2022-09-09 DIAGNOSIS — I2510 Atherosclerotic heart disease of native coronary artery without angina pectoris: Secondary | ICD-10-CM

## 2022-09-09 HISTORY — PX: STERIOD INJECTION: SHX5046

## 2022-09-09 HISTORY — PX: TOTAL KNEE ARTHROPLASTY: SHX125

## 2022-09-09 LAB — GLUCOSE, CAPILLARY
Glucose-Capillary: 161 mg/dL — ABNORMAL HIGH (ref 70–99)
Glucose-Capillary: 111 mg/dL — ABNORMAL HIGH (ref 70–99)
Glucose-Capillary: 172 mg/dL — ABNORMAL HIGH (ref 70–99)
Glucose-Capillary: 215 mg/dL — ABNORMAL HIGH (ref 70–99)
Glucose-Capillary: 189 mg/dL — ABNORMAL HIGH (ref 70–99)

## 2022-09-09 LAB — SURGICAL PCR SCREEN
MRSA, PCR: NEGATIVE
Staphylococcus aureus: NEGATIVE

## 2022-09-09 LAB — HEMOGLOBIN A1C
Hgb A1c MFr Bld: 5.9 % — ABNORMAL HIGH (ref 4.8–5.6)
Mean Plasma Glucose: 122.63 mg/dL

## 2022-09-09 SURGERY — ARTHROPLASTY, KNEE, TOTAL
Anesthesia: General | Site: Knee | Laterality: Right

## 2022-09-09 MED ORDER — CEFAZOLIN SODIUM-DEXTROSE 2-4 GM/100ML-% IV SOLN
2.0000 g | Freq: Four times a day (QID) | INTRAVENOUS | Status: AC
  Administered 2022-09-09 (×2): 2 g via INTRAVENOUS
  Filled 2022-09-09 (×2): qty 100

## 2022-09-09 MED ORDER — EMPAGLIFLOZIN 10 MG PO TABS
10.0000 mg | ORAL_TABLET | Freq: Every day | ORAL | Status: DC
  Administered 2022-09-10: 10 mg via ORAL
  Filled 2022-09-09: qty 1

## 2022-09-09 MED ORDER — LACTATED RINGERS IV SOLN: INTRAVENOUS | Status: DC

## 2022-09-09 MED ORDER — ALPRAZOLAM 0.5 MG PO TABS
0.5000 mg | ORAL_TABLET | Freq: Every day | ORAL | Status: DC
  Administered 2022-09-09: 0.5 mg via ORAL
  Filled 2022-09-09: qty 1

## 2022-09-09 MED ORDER — CEFAZOLIN SODIUM-DEXTROSE 2-4 GM/100ML-% IV SOLN
2.0000 g | INTRAVENOUS | Status: AC
  Administered 2022-09-09: 2 g via INTRAVENOUS
  Filled 2022-09-09: qty 100

## 2022-09-09 MED ORDER — SCOPOLAMINE 1 MG/3DAYS TD PT72
MEDICATED_PATCH | TRANSDERMAL | Status: DC | PRN
  Administered 2022-09-09: 1 via TRANSDERMAL

## 2022-09-09 MED ORDER — DOCUSATE SODIUM 100 MG PO CAPS
100.0000 mg | ORAL_CAPSULE | Freq: Two times a day (BID) | ORAL | Status: DC
  Administered 2022-09-09 – 2022-09-10 (×2): 100 mg via ORAL
  Filled 2022-09-09 (×2): qty 1

## 2022-09-09 MED ORDER — LIDOCAINE HCL (CARDIAC) PF 100 MG/5ML IV SOSY
PREFILLED_SYRINGE | INTRAVENOUS | Status: DC | PRN
  Administered 2022-09-09: 60 mg via INTRAVENOUS

## 2022-09-09 MED ORDER — PROPOFOL 10 MG/ML IV BOLUS
INTRAVENOUS | Status: DC | PRN
Start: 2022-09-09 — End: 2022-09-09
  Administered 2022-09-09: 160 mg via INTRAVENOUS

## 2022-09-09 MED ORDER — BUPIVACAINE LIPOSOME 1.3 % IJ SUSP: 20.0000 mL | Freq: Once | INTRAMUSCULAR | Status: DC

## 2022-09-09 MED ORDER — PROPOFOL 500 MG/50ML IV EMUL
INTRAVENOUS | Status: DC | PRN
Start: 2022-09-09 — End: 2022-09-09
  Administered 2022-09-09: 120 ug/kg/min via INTRAVENOUS

## 2022-09-09 MED ORDER — METHYLPREDNISOLONE ACETATE 40 MG/ML IJ SUSP
INTRAMUSCULAR | Status: AC
  Filled 2022-09-09: qty 2

## 2022-09-09 MED ORDER — SUMATRIPTAN SUCCINATE 50 MG PO TABS: 100.0000 mg | ORAL_TABLET | ORAL | Status: DC | PRN

## 2022-09-09 MED ORDER — RIZATRIPTAN BENZOATE 10 MG PO TBDP: 10.0000 mg | ORAL_TABLET | ORAL | Status: DC | PRN

## 2022-09-09 MED ORDER — METHOCARBAMOL 500 MG IVPB - SIMPLE MED
500.0000 mg | Freq: Four times a day (QID) | INTRAVENOUS | Status: DC | PRN
  Administered 2022-09-09: 500 mg via INTRAVENOUS

## 2022-09-09 MED ORDER — MIDAZOLAM HCL 5 MG/5ML IJ SOLN
INTRAMUSCULAR | Status: DC | PRN
  Administered 2022-09-09 (×2): 1 mg via INTRAVENOUS

## 2022-09-09 MED ORDER — METOCLOPRAMIDE HCL 5 MG/ML IJ SOLN: 5.0000 mg | Freq: Three times a day (TID) | INTRAMUSCULAR | Status: DC | PRN

## 2022-09-09 MED ORDER — OXYCODONE HCL 5 MG PO TABS
5.0000 mg | ORAL_TABLET | ORAL | Status: DC | PRN
  Administered 2022-09-09 – 2022-09-10 (×6): 10 mg via ORAL
  Filled 2022-09-09 (×6): qty 2

## 2022-09-09 MED ORDER — BUPIVACAINE LIPOSOME 1.3 % IJ SUSP
INTRAMUSCULAR | Status: AC
  Filled 2022-09-09: qty 20

## 2022-09-09 MED ORDER — DROPERIDOL 2.5 MG/ML IJ SOLN: 0.6250 mg | Freq: Once | INTRAMUSCULAR | Status: DC | PRN

## 2022-09-09 MED ORDER — GABAPENTIN 100 MG PO CAPS
200.0000 mg | ORAL_CAPSULE | Freq: Every day | ORAL | Status: DC
  Administered 2022-09-10: 200 mg via ORAL
  Filled 2022-09-09: qty 2

## 2022-09-09 MED ORDER — INSULIN ASPART 100 UNIT/ML IJ SOLN
0.0000 [IU] | INTRAMUSCULAR | Status: DC | PRN
  Administered 2022-09-09: 2 [IU] via SUBCUTANEOUS

## 2022-09-09 MED ORDER — PHENYLEPHRINE HCL-NACL 20-0.9 MG/250ML-% IV SOLN
INTRAVENOUS | Status: AC
  Filled 2022-09-09: qty 250

## 2022-09-09 MED ORDER — SODIUM CHLORIDE (PF) 0.9 % IJ SOLN
INTRAMUSCULAR | Status: DC | PRN
  Administered 2022-09-09: 60 mL

## 2022-09-09 MED ORDER — ACETAMINOPHEN 10 MG/ML IV SOLN: 1000.0000 mg | Freq: Once | INTRAVENOUS | Status: DC | PRN

## 2022-09-09 MED ORDER — FENTANYL CITRATE (PF) 250 MCG/5ML IJ SOLN
INTRAMUSCULAR | Status: AC
  Filled 2022-09-09: qty 5

## 2022-09-09 MED ORDER — DEXAMETHASONE SODIUM PHOSPHATE 10 MG/ML IJ SOLN
INTRAMUSCULAR | Status: AC
  Filled 2022-09-09: qty 1

## 2022-09-09 MED ORDER — DEXAMETHASONE SODIUM PHOSPHATE 10 MG/ML IJ SOLN
10.0000 mg | Freq: Once | INTRAMUSCULAR | Status: AC
  Administered 2022-09-10: 10 mg via INTRAVENOUS
  Filled 2022-09-09: qty 1

## 2022-09-09 MED ORDER — THYROID 30 MG PO TABS
30.0000 mg | ORAL_TABLET | Freq: Every day | ORAL | Status: DC
  Administered 2022-09-10: 30 mg via ORAL
  Filled 2022-09-09: qty 1

## 2022-09-09 MED ORDER — KETAMINE HCL 10 MG/ML IJ SOLN
INTRAMUSCULAR | Status: DC | PRN
Start: 2022-09-09 — End: 2022-09-09
  Administered 2022-09-09: 20 mg via INTRAVENOUS

## 2022-09-09 MED ORDER — UBROGEPANT 50 MG PO TABS: 50.0000 mg | ORAL_TABLET | Freq: Every day | ORAL | Status: DC | PRN

## 2022-09-09 MED ORDER — NAPHAZOLINE-PHENIRAMINE 0.027-0.315 % OP SOLN: 1.0000 [drp] | Freq: Three times a day (TID) | OPHTHALMIC | Status: DC | PRN

## 2022-09-09 MED ORDER — METHOCARBAMOL 500 MG PO TABS
500.0000 mg | ORAL_TABLET | Freq: Four times a day (QID) | ORAL | Status: DC | PRN
  Administered 2022-09-09 – 2022-09-10 (×4): 500 mg via ORAL
  Filled 2022-09-09 (×4): qty 1

## 2022-09-09 MED ORDER — SODIUM CHLORIDE (PF) 0.9 % IJ SOLN
INTRAMUSCULAR | Status: AC
  Filled 2022-09-09: qty 50

## 2022-09-09 MED ORDER — ONDANSETRON HCL 4 MG PO TABS: 4.0000 mg | ORAL_TABLET | Freq: Four times a day (QID) | ORAL | Status: DC | PRN

## 2022-09-09 MED ORDER — ATORVASTATIN CALCIUM 20 MG PO TABS
20.0000 mg | ORAL_TABLET | Freq: Every day | ORAL | Status: DC
  Administered 2022-09-10: 20 mg via ORAL
  Filled 2022-09-09: qty 1

## 2022-09-09 MED ORDER — HYDRALAZINE HCL 20 MG/ML IJ SOLN
INTRAMUSCULAR | Status: AC
  Filled 2022-09-09: qty 1

## 2022-09-09 MED ORDER — ACETAMINOPHEN 500 MG PO TABS: 1000.0000 mg | ORAL_TABLET | Freq: Once | ORAL | Status: DC

## 2022-09-09 MED ORDER — DOXAZOSIN MESYLATE 4 MG PO TABS
4.0000 mg | ORAL_TABLET | Freq: Every day | ORAL | Status: DC
  Administered 2022-09-09: 4 mg via ORAL
  Filled 2022-09-09: qty 1

## 2022-09-09 MED ORDER — GABAPENTIN 300 MG PO CAPS: 300.0000 mg | ORAL_CAPSULE | Freq: Every day | ORAL | Status: DC

## 2022-09-09 MED ORDER — VILAZODONE HCL 20 MG PO TABS
20.0000 mg | ORAL_TABLET | Freq: Every day | ORAL | Status: DC
  Administered 2022-09-10: 20 mg via ORAL
  Filled 2022-09-09: qty 1

## 2022-09-09 MED ORDER — CHLORHEXIDINE GLUCONATE 0.12 % MT SOLN
15.0000 mL | Freq: Once | OROMUCOSAL | Status: AC
  Administered 2022-09-09: 15 mL via OROMUCOSAL

## 2022-09-09 MED ORDER — HYDROMORPHONE HCL 1 MG/ML IJ SOLN
0.2500 mg | INTRAMUSCULAR | Status: DC | PRN
  Administered 2022-09-09 (×4): 0.5 mg via INTRAVENOUS

## 2022-09-09 MED ORDER — ACETAMINOPHEN 325 MG PO TABS: 325.0000 mg | ORAL_TABLET | Freq: Once | ORAL | Status: DC | PRN

## 2022-09-09 MED ORDER — ONDANSETRON HCL 4 MG/2ML IJ SOLN: 4.0000 mg | Freq: Four times a day (QID) | INTRAMUSCULAR | Status: DC | PRN

## 2022-09-09 MED ORDER — BUPIVACAINE LIPOSOME 1.3 % IJ SUSP
INTRAMUSCULAR | Status: DC | PRN
  Administered 2022-09-09: 20 mL

## 2022-09-09 MED ORDER — SODIUM CHLORIDE (PF) 0.9 % IJ SOLN
INTRAMUSCULAR | Status: AC
  Filled 2022-09-09: qty 10

## 2022-09-09 MED ORDER — ACETAMINOPHEN 10 MG/ML IV SOLN
1000.0000 mg | Freq: Four times a day (QID) | INTRAVENOUS | Status: DC
  Administered 2022-09-09: 1000 mg via INTRAVENOUS
  Filled 2022-09-09: qty 100

## 2022-09-09 MED ORDER — HYDRALAZINE HCL 20 MG/ML IJ SOLN
INTRAMUSCULAR | Status: DC | PRN
  Administered 2022-09-09: 2.5 mg via INTRAVENOUS

## 2022-09-09 MED ORDER — MENTHOL 3 MG MT LOZG: 1.0000 | LOZENGE | OROMUCOSAL | Status: DC | PRN

## 2022-09-09 MED ORDER — TRANEXAMIC ACID-NACL 1000-0.7 MG/100ML-% IV SOLN
1000.0000 mg | INTRAVENOUS | Status: AC
  Administered 2022-09-09: 1000 mg via INTRAVENOUS
  Filled 2022-09-09: qty 100

## 2022-09-09 MED ORDER — PROMETHAZINE HCL 25 MG/ML IJ SOLN: 6.2500 mg | INTRAMUSCULAR | Status: DC | PRN

## 2022-09-09 MED ORDER — HYDROCHLOROTHIAZIDE 12.5 MG PO TABS
12.5000 mg | ORAL_TABLET | Freq: Every day | ORAL | Status: DC
  Administered 2022-09-10: 12.5 mg via ORAL
  Filled 2022-09-09: qty 1

## 2022-09-09 MED ORDER — EPHEDRINE SULFATE (PRESSORS) 50 MG/ML IJ SOLN
INTRAMUSCULAR | Status: DC | PRN
  Administered 2022-09-09: 5 mg via INTRAVENOUS

## 2022-09-09 MED ORDER — ACETAMINOPHEN 500 MG PO TABS
1000.0000 mg | ORAL_TABLET | Freq: Four times a day (QID) | ORAL | Status: AC
  Administered 2022-09-09 – 2022-09-10 (×3): 1000 mg via ORAL
  Filled 2022-09-09 (×3): qty 2

## 2022-09-09 MED ORDER — HYDROMORPHONE HCL 1 MG/ML IJ SOLN
INTRAMUSCULAR | Status: AC
  Filled 2022-09-09: qty 1

## 2022-09-09 MED ORDER — SODIUM CHLORIDE 0.9 % IV SOLN: INTRAVENOUS | Status: DC

## 2022-09-09 MED ORDER — BISACODYL 10 MG RE SUPP: 10.0000 mg | Freq: Every day | RECTAL | Status: DC | PRN

## 2022-09-09 MED ORDER — KETAMINE HCL 50 MG/5ML IJ SOSY
PREFILLED_SYRINGE | INTRAMUSCULAR | Status: AC
  Filled 2022-09-09: qty 5

## 2022-09-09 MED ORDER — POLYVINYL ALCOHOL 1.4 % OP SOLN: 1.0000 [drp] | Freq: Three times a day (TID) | OPHTHALMIC | Status: DC | PRN

## 2022-09-09 MED ORDER — DIPHENHYDRAMINE HCL 12.5 MG/5ML PO ELIX: 12.5000 mg | ORAL_SOLUTION | ORAL | Status: DC | PRN

## 2022-09-09 MED ORDER — DEXAMETHASONE SODIUM PHOSPHATE 10 MG/ML IJ SOLN
8.0000 mg | Freq: Once | INTRAMUSCULAR | Status: AC
  Administered 2022-09-09 (×3): 4 mg via INTRAVENOUS

## 2022-09-09 MED ORDER — HYDROMORPHONE HCL 1 MG/ML IJ SOLN
0.5000 mg | INTRAMUSCULAR | Status: DC | PRN
  Administered 2022-09-09 – 2022-09-10 (×2): 1 mg via INTRAVENOUS
  Filled 2022-09-09 (×2): qty 1

## 2022-09-09 MED ORDER — SODIUM CHLORIDE 0.9 % IR SOLN
Status: DC | PRN
  Administered 2022-09-09: 3000 mL

## 2022-09-09 MED ORDER — ORAL CARE MOUTH RINSE: 15.0000 mL | Freq: Once | OROMUCOSAL | Status: AC

## 2022-09-09 MED ORDER — MIDAZOLAM HCL 2 MG/2ML IJ SOLN
1.0000 mg | INTRAMUSCULAR | Status: DC
  Administered 2022-09-09: 2 mg via INTRAVENOUS
  Filled 2022-09-09: qty 2

## 2022-09-09 MED ORDER — FENTANYL CITRATE (PF) 100 MCG/2ML IJ SOLN
INTRAMUSCULAR | Status: AC
  Filled 2022-09-09: qty 2

## 2022-09-09 MED ORDER — FLEET ENEMA RE ENEM: 1.0000 | ENEMA | Freq: Once | RECTAL | Status: DC | PRN

## 2022-09-09 MED ORDER — ACETAMINOPHEN 160 MG/5ML PO SOLN: 325.0000 mg | Freq: Once | ORAL | Status: DC | PRN

## 2022-09-09 MED ORDER — ONDANSETRON HCL 4 MG/2ML IJ SOLN
INTRAMUSCULAR | Status: DC | PRN
  Administered 2022-09-09: 4 mg via INTRAVENOUS

## 2022-09-09 MED ORDER — ALPRAZOLAM 0.25 MG PO TABS: 0.2500 mg | ORAL_TABLET | Freq: Two times a day (BID) | ORAL | Status: DC | PRN

## 2022-09-09 MED ORDER — METOCLOPRAMIDE HCL 5 MG PO TABS: 5.0000 mg | ORAL_TABLET | Freq: Three times a day (TID) | ORAL | Status: DC | PRN

## 2022-09-09 MED ORDER — FENTANYL CITRATE (PF) 100 MCG/2ML IJ SOLN
INTRAMUSCULAR | Status: DC | PRN
  Administered 2022-09-09: 25 ug via INTRAVENOUS
  Administered 2022-09-09: 75 ug via INTRAVENOUS
  Administered 2022-09-09 (×2): 25 ug via INTRAVENOUS
  Administered 2022-09-09 (×2): 50 ug via INTRAVENOUS

## 2022-09-09 MED ORDER — METHOCARBAMOL 500 MG IVPB - SIMPLE MED
INTRAVENOUS | Status: AC
  Filled 2022-09-09: qty 55

## 2022-09-09 MED ORDER — POVIDONE-IODINE 10 % EX SWAB: 2.0000 | Freq: Once | CUTANEOUS | Status: DC

## 2022-09-09 MED ORDER — SCOPOLAMINE 1 MG/3DAYS TD PT72
MEDICATED_PATCH | TRANSDERMAL | Status: AC
  Filled 2022-09-09: qty 1

## 2022-09-09 MED ORDER — ACETAMINOPHEN 325 MG PO TABS: 325.0000 mg | ORAL_TABLET | Freq: Four times a day (QID) | ORAL | Status: DC | PRN

## 2022-09-09 MED ORDER — POLYETHYLENE GLYCOL 3350 17 G PO PACK: 17.0000 g | PACK | Freq: Every day | ORAL | Status: DC | PRN

## 2022-09-09 MED ORDER — RIVAROXABAN 10 MG PO TABS
10.0000 mg | ORAL_TABLET | Freq: Every day | ORAL | Status: DC
  Administered 2022-09-10: 10 mg via ORAL
  Filled 2022-09-09: qty 1

## 2022-09-09 MED ORDER — DEXMEDETOMIDINE HCL IN NACL 80 MCG/20ML IV SOLN
INTRAVENOUS | Status: DC | PRN
  Administered 2022-09-09: 8 ug via INTRAVENOUS

## 2022-09-09 MED ORDER — INSULIN ASPART 100 UNIT/ML IJ SOLN
INTRAMUSCULAR | Status: AC
  Filled 2022-09-09: qty 1

## 2022-09-09 MED ORDER — TRAMADOL HCL 50 MG PO TABS
50.0000 mg | ORAL_TABLET | Freq: Four times a day (QID) | ORAL | Status: DC | PRN
  Administered 2022-09-10: 100 mg via ORAL
  Filled 2022-09-09: qty 2

## 2022-09-09 MED ORDER — 0.9 % SODIUM CHLORIDE (POUR BTL) OPTIME
TOPICAL | Status: DC | PRN
  Administered 2022-09-09: 1000 mL

## 2022-09-09 MED ORDER — PROPOFOL 10 MG/ML IV BOLUS
INTRAVENOUS | Status: AC
  Filled 2022-09-09: qty 20

## 2022-09-09 MED ORDER — ACETAMINOPHEN 10 MG/ML IV SOLN
INTRAVENOUS | Status: AC
  Filled 2022-09-09: qty 100

## 2022-09-09 MED ORDER — ROPIVACAINE HCL 5 MG/ML IJ SOLN
INTRAMUSCULAR | Status: DC | PRN
  Administered 2022-09-09: 30 mL via PERINEURAL

## 2022-09-09 MED ORDER — PHENOL 1.4 % MT LIQD: 1.0000 | OROMUCOSAL | Status: DC | PRN

## 2022-09-09 MED ORDER — INSULIN ASPART 100 UNIT/ML IJ SOLN
0.0000 [IU] | Freq: Three times a day (TID) | INTRAMUSCULAR | Status: DC
  Administered 2022-09-09: 5 [IU] via SUBCUTANEOUS
  Administered 2022-09-09: 3 [IU] via SUBCUTANEOUS
  Administered 2022-09-10 (×2): 2 [IU] via SUBCUTANEOUS

## 2022-09-09 MED ORDER — MEPERIDINE HCL 50 MG/ML IJ SOLN: 6.2500 mg | INTRAMUSCULAR | Status: DC | PRN

## 2022-09-09 MED ORDER — METOPROLOL SUCCINATE ER 50 MG PO TB24
100.0000 mg | ORAL_TABLET | Freq: Every day | ORAL | Status: DC
  Administered 2022-09-09: 100 mg via ORAL
  Filled 2022-09-09: qty 2

## 2022-09-09 MED ORDER — FENTANYL CITRATE PF 50 MCG/ML IJ SOSY
50.0000 ug | PREFILLED_SYRINGE | INTRAMUSCULAR | Status: DC
  Filled 2022-09-09: qty 2

## 2022-09-09 MED ORDER — METHYLPREDNISOLONE ACETATE 80 MG/ML IJ SUSP
INTRAMUSCULAR | Status: DC | PRN
Start: 2022-09-09 — End: 2022-09-09
  Administered 2022-09-09: 80 mg

## 2022-09-09 MED ORDER — STERILE WATER FOR IRRIGATION IR SOLN
Status: DC | PRN
  Administered 2022-09-09: 1000 mL

## 2022-09-09 MED ORDER — MIDAZOLAM HCL 2 MG/2ML IJ SOLN
INTRAMUSCULAR | Status: AC
  Filled 2022-09-09: qty 2

## 2022-09-09 SURGICAL SUPPLY — 56 items
ADH SKN CLS APL DERMABOND .7 (GAUZE/BANDAGES/DRESSINGS) ×2
ATTUNE PS FEM RT SZ 4 CEM KNEE (Femur) IMPLANT
ATTUNE PSRP INSR SZ4 8 KNEE (Insert) IMPLANT
BAG COUNTER SPONGE SURGICOUNT (BAG) IMPLANT
BAG SPEC THK2 15X12 ZIP CLS (MISCELLANEOUS) ×2
BAG SPNG CNTER NS LX DISP (BAG)
BAG ZIPLOCK 12X15 (MISCELLANEOUS) ×2 IMPLANT
BASEPLATE TIBIAL ROTATING SZ 4 (Knees) IMPLANT
BLADE SAG 18X100X1.27 (BLADE) ×2 IMPLANT
BLADE SAW SGTL 11.0X1.19X90.0M (BLADE) ×2 IMPLANT
BNDG CMPR 6 X 5 YARDS HK CLSR (GAUZE/BANDAGES/DRESSINGS) ×2
BNDG CMPR MED 10X6 ELC LF (GAUZE/BANDAGES/DRESSINGS) ×2
BNDG ELASTIC 6INX 5YD STR LF (GAUZE/BANDAGES/DRESSINGS) ×2 IMPLANT
BNDG ELASTIC 6X10 VLCR STRL LF (GAUZE/BANDAGES/DRESSINGS) IMPLANT
BOWL SMART MIX CTS (DISPOSABLE) ×2 IMPLANT
BSPLAT TIB 4 CMNT ROT PLAT STR (Knees) ×2 IMPLANT
CEMENT HV SMART SET (Cement) ×4 IMPLANT
COVER SURGICAL LIGHT HANDLE (MISCELLANEOUS) ×2 IMPLANT
CUFF TOURN SGL QUICK 34 (TOURNIQUET CUFF) ×2
CUFF TRNQT CYL 34X4.125X (TOURNIQUET CUFF) ×2 IMPLANT
DERMABOND ADVANCED .7 DNX12 (GAUZE/BANDAGES/DRESSINGS) ×2 IMPLANT
DRAPE U-SHAPE 47X51 STRL (DRAPES) ×2 IMPLANT
DRSG AQUACEL AG ADV 3.5X10 (GAUZE/BANDAGES/DRESSINGS) ×2 IMPLANT
DURAPREP 26ML APPLICATOR (WOUND CARE) ×2 IMPLANT
ELECT REM PT RETURN 15FT ADLT (MISCELLANEOUS) ×2 IMPLANT
GLOVE BIO SURGEON STRL SZ 6.5 (GLOVE) IMPLANT
GLOVE BIO SURGEON STRL SZ8 (GLOVE) ×2 IMPLANT
GLOVE BIOGEL PI IND STRL 6.5 (GLOVE) IMPLANT
GLOVE BIOGEL PI IND STRL 7.0 (GLOVE) IMPLANT
GLOVE BIOGEL PI IND STRL 8 (GLOVE) ×2 IMPLANT
GOWN STRL REUS W/ TWL LRG LVL3 (GOWN DISPOSABLE) ×2 IMPLANT
GOWN STRL REUS W/TWL LRG LVL3 (GOWN DISPOSABLE) ×2
HANDPIECE INTERPULSE COAX TIP (DISPOSABLE) ×2
HOLDER FOLEY CATH W/STRAP (MISCELLANEOUS) IMPLANT
IMMOBILIZER KNEE 20 (SOFTGOODS) ×2
IMMOBILIZER KNEE 20 THIGH 36 (SOFTGOODS) ×2 IMPLANT
KIT TURNOVER KIT A (KITS) IMPLANT
MANIFOLD NEPTUNE II (INSTRUMENTS) ×2 IMPLANT
NS IRRIG 1000ML POUR BTL (IV SOLUTION) ×2 IMPLANT
PACK TOTAL KNEE CUSTOM (KITS) ×2 IMPLANT
PADDING CAST ABS COTTON 6X4 NS (CAST SUPPLIES) IMPLANT
PADDING CAST COTTON 6X4 STRL (CAST SUPPLIES) ×4 IMPLANT
PATELLA MEDIAL ATTUN 35MM KNEE (Knees) IMPLANT
PIN STEINMAN FIXATION KNEE (PIN) IMPLANT
PROTECTOR NERVE ULNAR (MISCELLANEOUS) ×2 IMPLANT
SET HNDPC FAN SPRY TIP SCT (DISPOSABLE) ×2 IMPLANT
SPIKE FLUID TRANSFER (MISCELLANEOUS) ×2 IMPLANT
SUT MNCRL AB 4-0 PS2 18 (SUTURE) ×2 IMPLANT
SUT STRATAFIX 0 PDS 27 VIOLET (SUTURE) ×2
SUT VIC AB 2-0 CT1 27 (SUTURE) ×6
SUT VIC AB 2-0 CT1 TAPERPNT 27 (SUTURE) ×6 IMPLANT
SUTURE STRATFX 0 PDS 27 VIOLET (SUTURE) ×2 IMPLANT
TRAY FOLEY MTR SLVR 16FR STAT (SET/KITS/TRAYS/PACK) ×2 IMPLANT
TUBE SUCTION HIGH CAP CLEAR NV (SUCTIONS) ×2 IMPLANT
WATER STERILE IRR 1000ML POUR (IV SOLUTION) ×4 IMPLANT
WRAP KNEE MAXI GEL POST OP (GAUZE/BANDAGES/DRESSINGS) ×2 IMPLANT

## 2022-09-09 NOTE — Anesthesia Procedure Notes (Signed)
Anesthesia Regional Block: Adductor canal block   Pre-Anesthetic Checklist: , timeout performed,  Correct Patient, Correct Site, Correct Laterality,  Correct Procedure, Correct Position, site marked,  Risks and benefits discussed,  Surgical consent,  Pre-op evaluation,  At surgeon's request and post-op pain management  Laterality: Right  Prep: chloraprep       Needles:  Injection technique: Single-shot  Needle Type: Echogenic Stimulator Needle     Needle Length: 9cm  Needle Gauge: 21     Additional Needles:   Procedures:,,,, ultrasound used (permanent image in chart),,    Narrative:  Start time: 09/09/2022 7:50 AM End time: 09/09/2022 7:55 AM Injection made incrementally with aspirations every 5 mL.  Performed by: Personally  Anesthesiologist: [name], MD  Additional Notes: Discussed risks and benefits of the nerve block in detail, including but not limited vascular injury, permanent nerve damage and infection.   Patient tolerated the procedure well. Local anesthetic introduced in an incremental fashion under minimal resistance after negative aspirations. No paresthesias were elicited. After completion of the procedure, no acute issues were identified and patient continued to be monitored by RN.

## 2022-09-09 NOTE — Op Note (Signed)
OPERATIVE REPORT-TOTAL KNEE ARTHROPLASTY   Pre-operative diagnosis- Osteoarthritis  Bilateral knee(s)  Post-operative diagnosis- Osteoarthritis Bilateral knee(s)  Procedure-  Right  Total Knee Arthroplasty   Left knee cortisone injection  Surgeon- Gus Rankin. Kaeya Schiffer, MD  Assistant- Rene Paci, PA-C   Anesthesia-   General and adductor canal block  EBL-20 mL   Drains None  Tourniquet time-  Total Tourniquet Time Documented: Calf (Left) - 36 minutes Total: Calf (Left) - 36 minutes     Complications- None  Condition-PACU - hemodynamically stable.   Brief Clinical Note  [name] is a 62 y.o. year old female with end stage OA of her right knee with progressively worsening pain and dysfunction. She has constant pain, with activity and at rest and significant functional deficits with difficulties even with ADLs. She has had extensive non-op management including analgesics, injections of cortisone and viscosupplements, and home exercise program, but remains in significant pain with significant dysfunction.Radiographs show bone on bone arthritis medial and patellofemoral. She presents now for right Total Knee Arthroplasty. She also has advance osteoarthritis left knee and requests cortisone injection  Procedure in detail---   The patient is brought into the operating room and positioned supine on the operating table. After successful administration of  General and adductor canal block,   a tourniquet is placed high on the  Right thigh(s) and the lower extremity is prepped and draped in the usual sterile fashion. Time out is performed by the operating team and then the  Right lower extremity is wrapped in Esmarch, knee flexed and the tourniquet inflated to 300 mmHg.       A midline incision is made with a ten blade through the subcutaneous tissue to the level of the extensor mechanism. A fresh blade is used to make a medial parapatellar arthrotomy. Soft tissue over the proximal  medial tibia is subperiosteally elevated to the joint line with a knife and into the semimembranosus bursa with a Cobb elevator. Soft tissue over the proximal lateral tibia is elevated with attention being paid to avoiding the patellar tendon on the tibial tubercle. The patella is everted, knee flexed 90 degrees and the ACL and PCL are removed. Findings are bone on bone medial and patellofemoral with large global osteophytes        The drill is used to create a starting hole in the distal femur and the canal is thoroughly irrigated with sterile saline to remove the fatty contents. The 5 degree Right  valgus alignment guide is placed into the femoral canal and the distal femoral cutting block is pinned to remove 9 mm off the distal femur. Resection is made with an oscillating saw.      The tibia is subluxed forward and the menisci are removed. The extramedullary alignment guide is placed referencing proximally at the medial aspect of the tibial tubercle and distally along the second metatarsal axis and tibial crest. The block is pinned to remove 2mm off the more deficient medial  side. Resection is made with an oscillating saw. Size 4is the most appropriate size for the tibia and the proximal tibia is prepared with the modular drill and keel punch for that size.      The femoral sizing guide is placed and size 4 is most appropriate. Rotation is marked off the epicondylar axis and confirmed by creating a rectangular flexion gap at 90 degrees. The size 4 cutting block is pinned in this rotation and the anterior, posterior and chamfer cuts are made with  the oscillating saw. The intercondylar block is then placed and that cut is made.      Trial size 4 tibial component, trial size 4 posterior stabilized femur and a 8  mm posterior stabilized rotating platform insert trial is placed. Full extension is achieved with excellent varus/valgus and anterior/posterior balance throughout full range of motion. The patella is  everted and thickness measured to be 22  mm. Free hand resection is taken to 12 mm, a 35 template is placed, lug holes are drilled, trial patella is placed, and it tracks normally. Osteophytes are removed off the posterior femur with the trial in place. All trials are removed and the cut bone surfaces prepared with pulsatile lavage. Cement is mixed and once ready for implantation, the size 4 tibial implant, size  4 posterior stabilized femoral component, and the size 35 patella are cemented in place and the patella is held with the clamp. The trial insert is placed and the knee held in full extension. The Exparel (20 ml mixed with 60 ml saline) is injected into the extensor mechanism, posterior capsule, medial and lateral gutters and subcutaneous tissues.  All extruded cement is removed and once the cement is hard the permanent 8 mm posterior stabilized rotating platform insert is placed into the tibial tray.      The wound is copiously irrigated with saline solution and the extensor mechanism closed with # 0 Stratofix suture. The tourniquet is released for a total tourniquet time of 34  minutes. Flexion against gravity is 140 degrees and the patella tracks normally. Subcutaneous tissue is closed with 2.0 vicryl and subcuticular with running 4.0 Monocryl. The incision is cleaned and dried and steri-strips and a bulky sterile dressing are applied. The limb is placed into a knee immobilizer.      I then prepped the left knee with alcohol and injected with 80 mg (2 ml) of depomedrol with no problems.The patient is then awakened and transported to recovery in stable condition.      Please note that a surgical assistant was a medical necessity for this procedure in order to perform it in a safe and expeditious manner. Surgical assistant was necessary to retract the ligaments and vital neurovascular structures to prevent injury to them and also necessary for proper positioning of the limb to allow for anatomic  placement of the prosthesis.   Gus Rankin Charletha Dalpe, MD    09/09/2022, 9:45 AM

## 2022-09-09 NOTE — Progress Notes (Signed)
Orthopedic Tech Progress Note Patient Details:  [name] 1960-05-30 [id number] CPM removed.  CPM Right Knee CPM Right Knee: On Right Knee Flexion (Degrees): 40 Right Knee Extension (Degrees): 10  Post Interventions Patient Tolerated: Well  [name] [name] 09/09/2022, 2:11 PM

## 2022-09-09 NOTE — Transfer of Care (Signed)
Immediate Anesthesia Transfer of Care Note  Patient: [name]  Procedure(s) Performed: TOTAL KNEE ARTHROPLASTY (Right: Knee) LEFT KNEE STEROID INJECTION (Left: Knee)  Patient Location: PACU  Anesthesia Type:General  Level of Consciousness: awake, alert , oriented, and patient cooperative  Airway & Oxygen Therapy: Patient Spontanous Breathing and Patient connected to face mask oxygen  Post-op Assessment: Report given to RN and Post -op Vital signs reviewed and stable  Post vital signs: Reviewed and stable  Last Vitals:  Vitals Value Taken Time  BP 120/67 09/09/22 0957  Temp    Pulse 75 09/09/22 0959  Resp 18 09/09/22 0959  SpO2 100 % 09/09/22 0959  Vitals shown include unfiled device data.  Last Pain:  Vitals:   09/09/22 0640  PainSc: 7       Patients Stated Pain Goal: 5 (09/09/22 0640)  Complications: No notable events documented.

## 2022-09-09 NOTE — Anesthesia Procedure Notes (Signed)
Procedure Name: LMA Insertion Date/Time: 09/09/2022 8:36 AM  Performed by: Garth Bigness, CRNAPre-anesthesia Checklist: Patient identified, Emergency Drugs available, Suction available and Patient being monitored Patient Re-evaluated:Patient Re-evaluated prior to induction Oxygen Delivery Method: Circle system utilized Preoxygenation: Pre-oxygenation with 100% oxygen Induction Type: IV induction Ventilation: Mask ventilation without difficulty LMA: LMA inserted LMA Size: 4.0 Tube type: Oral Number of attempts: 1 Placement Confirmation: positive ETCO2 and breath sounds checked- equal and bilateral Tube secured with: Tape Dental Injury: Teeth and Oropharynx as per pre-operative assessment

## 2022-09-09 NOTE — Progress Notes (Signed)
   09/09/22 2100  BiPAP/CPAP/SIPAP  $ Non-Invasive Home Ventilator  Initial  BiPAP/CPAP/SIPAP Pt Type Adult  BiPAP/CPAP/SIPAP Resmed (Ours)  Mask Type Nasal pillows  Respiratory Rate 16 breaths/min  EPAP 9 cmH2O  FiO2 (%) 21 %  Flow Rate 0 lpm  Patient Home Equipment Yes (Only-(Nasal Pillows/Hose))  Auto Titrate No  CPAP/SIPAP surface wiped down Yes   Made aware to notify if needed.

## 2022-09-09 NOTE — Interval H&P Note (Signed)
History and Physical Interval Note:  09/09/2022 6:28 AM  [name]  has presented today for surgery, with the diagnosis of BILATERAL knee osteoarthritis.  The various methods of treatment have been discussed with the patient and family. After consideration of risks, benefits and other options for treatment, the patient has consented to  Procedure(s): TOTAL KNEE ARTHROPLASTY (Right) LEFT KNEE STEROID INJECTION (Left) as a surgical intervention.  The patient's history has been reviewed, patient examined, no change in status, stable for surgery.  I have reviewed the patient's chart and labs.  Questions were answered to the patient's satisfaction.     Homero Fellers Jadesola Poynter

## 2022-09-09 NOTE — Progress Notes (Signed)
Orthopedic Tech Progress Note Patient Details:  [name] Sep 09, 1960 [id number] CPM will be removed at 2 pm.  CPM Right Knee CPM Right Knee: On Right Knee Flexion (Degrees): 40 Right Knee Extension (Degrees): 10  Post Interventions Patient Tolerated: Well  [name] Genesi Stefanko 09/09/2022, 10:21 AM

## 2022-09-09 NOTE — Evaluation (Signed)
Physical Therapy Evaluation Patient Details Name: [name] MRN: [id number] DOB: October 23, 1960 Today's Date: 09/09/2022  History of Present Illness  62 yo female s/p R TKA on 09/09/22. PMH: back pain, L3-4 adn l 4-5 fusion 2023, HTN, DM, Cspine arthroplasty  Clinical Impression  Pt is s/p TKA resulting in the deficits listed below (see PT Problem List).  Pt amb 40' with RW and CGA-min assist; anticipate steady progress. Pt is motivated to d/c tomorrow   Pt will benefit from acute skilled PT to increase their independence and safety with mobility to allow discharge.          If plan is discharge home, recommend the following: A little help with walking and/or transfers;A little help with bathing/dressing/bathroom;Help with stairs or ramp for entrance;Assistance with cooking/housework   Can travel by private vehicle        Equipment Recommendations None recommended by PT  Recommendations for Other Services       Functional Status Assessment Patient has had a recent decline in their functional status and demonstrates the ability to make significant improvements in function in a reasonable and predictable amount of time.     Precautions / Restrictions Precautions Precautions: Fall;Knee Restrictions Weight Bearing Restrictions: No Other Position/Activity Restrictions: WBAT      Mobility  Bed Mobility Overal bed mobility: Needs Assistance Bed Mobility: Supine to Sit     Supine to sit: Contact guard, Supervision     General bed mobility comments: for safety, incr time    Transfers Overall transfer level: Needs assistance Equipment used: Rolling walker (2 wheels) Transfers: Sit to/from Stand Sit to Stand: Min assist, From elevated surface           General transfer comment: cues for hand placement and RLE position    Ambulation/Gait Ambulation/Gait assistance: Contact guard assist, Min assist Gait Distance (Feet): 40 Feet Assistive device: Rolling walker (2  wheels) Gait Pattern/deviations: Step-to pattern, Decreased stance time - right       General Gait Details: cues for sequence and RW position from self  Stairs            Wheelchair Mobility     Tilt Bed    Modified Rankin (Stroke Patients Only)       Balance                                             Pertinent Vitals/Pain Pain Assessment Pain Assessment: 0-10    Home Living Family/patient expects to be discharged to:: Private residence Living Arrangements: Spouse/significant other Available Help at Discharge: Family;Available PRN/intermittently Type of Home: House Home Access: Stairs to enter   Entrance Stairs-Number of Steps: 1 & 1 or 6 steps   Home Layout: Two level;Able to live on main level with bedroom/bathroom Home Equipment: Rollator (4 wheels);BSC/3in1;Other (comment);Hand held shower head;Rolling Walker (2 wheels) Additional Comments: lift chair    Prior Function Prior Level of Function : Independent/Modified Independent;Driving             Mobility Comments: independent with extra time       Extremity/Trunk Assessment   Upper Extremity Assessment Upper Extremity Assessment: Overall WFL for tasks assessed    Lower Extremity Assessment Lower Extremity Assessment: RLE deficits/detail RLE Deficits / Details: ankle WFL, knee extension and hip flexion 2+/5, 15 degree quad lag       Communication  Cognition                                                General Comments      Exercises Total Joint Exercises Ankle Circles/Pumps: AROM, Both, 5 reps Quad Sets: 5 reps, AROM, Both Straight Leg Raises: 5 reps, Right, AAROM   Assessment/Plan    PT Assessment Patient needs continued PT services  PT Problem List Decreased strength;Decreased range of motion;Decreased activity tolerance;Decreased mobility;Decreased knowledge of precautions;Decreased knowledge of use of DME       PT Treatment  Interventions DME instruction;Gait training;Stair training;Functional mobility training;Therapeutic activities;Therapeutic exercise;Balance training;Patient/family education    PT Goals (Current goals can be found in the Care Plan section)  Acute Rehab PT Goals PT Goal Formulation: With patient Time For Goal Achievement: 09/16/22 Potential to Achieve Goals: Good    Frequency 7X/week     Co-evaluation               AM-PAC PT "6 Clicks" Mobility  Outcome Measure Help needed turning from your back to your side while in a flat bed without using bedrails?: A Little Help needed moving from lying on your back to sitting on the side of a flat bed without using bedrails?: A Little Help needed moving to and from a bed to a chair (including a wheelchair)?: A Little Help needed standing up from a chair using your arms (e.g., wheelchair or bedside chair)?: A Little Help needed to walk in hospital room?: A Little Help needed climbing 3-5 steps with a railing? : A Little 6 Click Score: 18    End of Session Equipment Utilized During Treatment: Gait belt Activity Tolerance: Patient tolerated treatment well Patient left: in chair;with call bell/phone within reach;with chair alarm set;with family/visitor present Nurse Communication: Mobility status PT Visit Diagnosis: Other abnormalities of gait and mobility (R26.89);Difficulty in walking, not elsewhere classified (R26.2)    Time: 9604-5409 PT Time Calculation (min) (ACUTE ONLY): 29 min   Charges:   PT Evaluation $PT Eval Low Complexity: 1 Low PT Treatments $Gait Training: 8-22 mins PT General Charges $$ ACUTE PT VISIT: 1 Visit         Noreene Boreman, PT  Acute Rehab Dept [facility]) [phone]  09/09/2022   [facility] 09/09/2022, 3:41 PM

## 2022-09-09 NOTE — Anesthesia Postprocedure Evaluation (Signed)
Anesthesia Post Note  Patient: [name]  Procedure(s) Performed: TOTAL KNEE ARTHROPLASTY (Right: Knee) LEFT KNEE STEROID INJECTION (Left: Knee)     Patient location during evaluation: PACU Anesthesia Type: General Level of consciousness: awake and alert Pain management: pain level controlled Vital Signs Assessment: post-procedure vital signs reviewed and stable Respiratory status: spontaneous breathing, nonlabored ventilation, respiratory function stable and patient connected to nasal cannula oxygen Cardiovascular status: blood pressure returned to baseline and stable Postop Assessment: no apparent nausea or vomiting Anesthetic complications: no  No notable events documented.  Last Vitals:  Vitals:   09/09/22 1100 09/09/22 1116  BP: 138/75 128/75  Pulse: 69 72  Resp: 15 16  Temp: 36.6 C 36.5 C  SpO2: 95% 98%    Last Pain:  Vitals:   09/09/22 1116  TempSrc: Oral  PainSc:                  [name]

## 2022-09-09 NOTE — Discharge Instructions (Addendum)
 Frank Aluisio, MD Total Joint Specialist [facility] [place] [address] [place], [place] [address] [phone]  TOTAL KNEE REPLACEMENT POSTOPERATIVE DIRECTIONS    Knee Rehabilitation, Guidelines Following Surgery  Results after knee surgery are often greatly improved when you follow the exercise, range of motion and muscle strengthening exercises prescribed by your doctor. Safety measures are also important to protect the knee from further injury. If any of these exercises cause you to have increased pain or swelling in your knee joint, decrease the amount until you are comfortable again and slowly increase them. If you have problems or questions, call your caregiver or physical therapist for advice.   BLOOD CLOT PREVENTION Take a 10 mg Xarelto once a day for three weeks following surgery. Then resume one 81 mg aspirin once a day. You may resume your vitamins/supplements once you have discontinued the Xarelto. Do not take any NSAIDs (Advil, Aleve, Ibuprofen, Meloxicam, etc.) until you have discontinued the Xarelto.   HOME CARE INSTRUCTIONS  Remove items at home which could result in a fall. This includes throw rugs or furniture in walking pathways.  ICE to the affected knee as much as tolerated. Icing helps control swelling. If the swelling is well controlled you will be more comfortable and rehab easier. Continue to use ice on the knee for pain and swelling from surgery. You may notice swelling that will progress down to the foot and ankle. This is normal after surgery. Elevate the leg when you are not up walking on it.    Continue to use the breathing machine which will help keep your temperature down. It is common for your temperature to cycle up and down following surgery, especially at night when you are not up moving around and exerting yourself. The breathing machine keeps your lungs expanded and your temperature down. Do not place pillow under the operative  knee, focus on keeping the knee straight while resting  DIET You may resume your previous home diet once you are discharged from the hospital.  DRESSING / WOUND CARE / SHOWERING Keep your bulky bandage on for 2 days. On the third post-operative day you may remove the Ace bandage and gauze. There is a waterproof adhesive bandage on your skin which will stay in place until your first follow-up appointment. Once you remove this you will not need to place another bandage You may begin showering 3 days following surgery, but do not submerge the incision under water.  ACTIVITY For the first 5 days, the key is rest and control of pain and swelling Do your home exercises twice a day starting on post-operative day 3. On the days you go to physical therapy, just do the home exercises once that day. You should rest, ice and elevate the leg for 50 minutes out of every hour. Get up and walk/stretch for 10 minutes per hour. After 5 days you can increase your activity slowly as tolerated. Walk with your walker as instructed. Use the walker until you are comfortable transitioning to a cane. Walk with the cane in the opposite hand of the operative leg. You may discontinue the cane once you are comfortable and walking steadily. Avoid periods of inactivity such as sitting longer than an hour when not asleep. This helps prevent blood clots.  You may discontinue the knee immobilizer once you are able to perform a straight leg raise while lying down. You may resume a sexual relationship in one month or when given the OK by your   doctor.  You may return to work once you are cleared by your doctor.  Do not drive a car for 6 weeks or until released by your surgeon.  Do not drive while taking narcotics.  TED HOSE STOCKINGS Wear the elastic stockings on both legs for three weeks following surgery during the day. You may remove them at night for sleeping.  WEIGHT BEARING Weight bearing as tolerated with assist device  (walker, cane, etc) as directed, use it as long as suggested by your surgeon or therapist, typically at least 4-6 weeks.  POSTOPERATIVE CONSTIPATION PROTOCOL Constipation - defined medically as fewer than three stools per week and severe constipation as less than one stool per week.  One of the most common issues patients have following surgery is constipation.  Even if you have a regular bowel pattern at home, your normal regimen is likely to be disrupted due to multiple reasons following surgery.  Combination of anesthesia, postoperative narcotics, change in appetite and fluid intake all can affect your bowels.  In order to avoid complications following surgery, here are some recommendations in order to help you during your recovery period.  Colace (docusate) - Pick up an over-the-counter form of Colace or another stool softener and take twice a day as long as you are requiring postoperative pain medications.  Take with a full glass of water daily.  If you experience loose stools or diarrhea, hold the colace until you stool forms back up. If your symptoms do not get better within 1 week or if they get worse, check with your doctor. Dulcolax (bisacodyl) - Pick up over-the-counter and take as directed by the product packaging as needed to assist with the movement of your bowels.  Take with a full glass of water.  Use this product as needed if not relieved by Colace only.  MiraLax (polyethylene glycol) - Pick up over-the-counter to have on hand. MiraLax is a solution that will increase the amount of water in your bowels to assist with bowel movements.  Take as directed and can mix with a glass of water, juice, soda, coffee, or tea. Take if you go more than two days without a movement. Do not use MiraLax more than once per day. Call your doctor if you are still constipated or irregular after using this medication for 7 days in a row.  If you continue to have problems with postoperative constipation, please  contact the office for further assistance and recommendations.  If you experience "the worst abdominal pain ever" or develop nausea or vomiting, please contact the office immediatly for further recommendations for treatment.  ITCHING If you experience itching with your medications, try taking only a single pain pill, or even half a pain pill at a time.  You can also use Benadryl over the counter for itching or also to help with sleep.   MEDICATIONS See your medication summary on the "After Visit Summary" that the nursing staff will review with you prior to discharge.  You may have some home medications which will be placed on hold until you complete the course of blood thinner medication.  It is important for you to complete the blood thinner medication as prescribed by your surgeon.  Continue your approved medications as instructed at time of discharge.  PRECAUTIONS If you experience chest pain or shortness of breath - call 911 immediately for transfer to the hospital emergency department.  If you develop a fever greater that 101 F, purulent drainage from wound, increased redness or  drainage from wound, foul odor from the wound/dressing, or calf pain - CONTACT YOUR SURGEON.                                                   FOLLOW-UP APPOINTMENTS Make sure you keep all of your appointments after your operation with your surgeon and caregivers. You should call the office at the above phone number and make an appointment for approximately two weeks after the date of your surgery or on the date instructed by your surgeon outlined in the "After Visit Summary".  RANGE OF MOTION AND STRENGTHENING EXERCISES  Rehabilitation of the knee is important following a knee injury or an operation. After just a few days of immobilization, the muscles of the thigh which control the knee become weakened and shrink (atrophy). Knee exercises are designed to build up the tone and strength of the thigh muscles and to  improve knee motion. Often times heat used for twenty to thirty minutes before working out will loosen up your tissues and help with improving the range of motion but do not use heat for the first two weeks following surgery. These exercises can be done on a training (exercise) mat, on the floor, on a table or on a bed. Use what ever works the best and is most comfortable for you Knee exercises include:  Leg Lifts - While your knee is still immobilized in a splint or cast, you can do straight leg raises. Lift the leg to 60 degrees, hold for 3 sec, and slowly lower the leg. Repeat 10-20 times 2-3 times daily. Perform this exercise against resistance later as your knee gets better.  Quad and Hamstring Sets - Tighten up the muscle on the front of the thigh (Quad) and hold for 5-10 sec. Repeat this 10-20 times hourly. Hamstring sets are done by pushing the foot backward against an object and holding for 5-10 sec. Repeat as with quad sets.  Leg Slides: Lying on your back, slowly slide your foot toward your buttocks, bending your knee up off the floor (only go as far as is comfortable). Then slowly slide your foot back down until your leg is flat on the floor again. Angel Wings: Lying on your back spread your legs to the side as far apart as you can without causing discomfort.  A rehabilitation program following serious knee injuries can speed recovery and prevent re-injury in the future due to weakened muscles. Contact your doctor or a physical therapist for more information on knee rehabilitation.   POST-OPERATIVE OPIOID TAPER INSTRUCTIONS: It is important to wean off of your opioid medication as soon as possible. If you do not need pain medication after your surgery it is ok to stop day one. Opioids include: Codeine, Hydrocodone(Norco, Vicodin), Oxycodone(Percocet, oxycontin) and hydromorphone amongst others.  Long term and even short term use of opiods can cause: Increased pain  response Dependence Constipation Depression Respiratory depression And more.  Withdrawal symptoms can include Flu like symptoms Nausea, vomiting And more Techniques to manage these symptoms Hydrate well Eat regular healthy meals Stay active Use relaxation techniques(deep breathing, meditating, yoga) Do Not substitute Alcohol to help with tapering If you have been on opioids for less than two weeks and do not have pain than it is ok to stop all together.  Plan to wean off of opioids This plan  should start within one week post op of your joint replacement. Maintain the same interval or time between taking each dose and first decrease the dose.  Cut the total daily intake of opioids by one tablet each day Next start to increase the time between doses. The last dose that should be eliminated is the evening dose.   IF YOU ARE TRANSFERRED TO A SKILLED REHAB FACILITY If the patient is transferred to a skilled rehab facility following release from the hospital, a list of the current medications will be sent to the facility for the patient to continue.  When discharged from the skilled rehab facility, please have the facility set up the patient's [place] prior to being released. Also, the skilled facility will be responsible for providing the patient with their medications at time of release from the facility to include their pain medication, the muscle relaxants, and their blood thinner medication. If the patient is still at the rehab facility at time of the two week follow up appointment, the skilled rehab facility will also need to assist the patient in arranging follow up appointment in our office and any transportation needs.  MAKE SURE YOU:  Understand these instructions.  Get help right away if you are not doing well or get worse.   DENTAL ANTIBIOTICS:  In most cases prophylactic antibiotics for Dental procdeures after total joint surgery are not  necessary.  Exceptions are as follows:  1. History of prior total joint infection  2. Severely immunocompromised (Organ Transplant, cancer chemotherapy, Rheumatoid biologic meds such as [place])  3. Poorly controlled diabetes (A1C &gt; 8.0, blood glucose over 200)  If you have one of these conditions, contact your surgeon for an antibiotic prescription, prior to your dental procedure.    Pick up stool softner and laxative for home use following surgery while on pain medications. Do not submerge incision under water. Please use good hand washing techniques while changing dressing each day. May shower starting three days after surgery. Please use a clean towel to pat the incision dry following showers. Continue to use ice for pain and swelling after surgery. Do not use any lotions or creams on the incision until instructed by your surgeon.  ________________________________________  Information on my medicine - XARELTO (Rivaroxaban)  This medication education was reviewed with me or my healthcare representative as part of my discharge preparation.    Why was Xarelto prescribed for you? Xarelto was prescribed for you to reduce the risk of blood clots forming after orthopedic surgery. The medical term for these abnormal blood clots is venous thromboembolism (VTE).  What do you need to know about xarelto ? Take your Xarelto ONCE DAILY at the same time every day. You may take it either with or without food.  If you have difficulty swallowing the tablet whole, you may crush it and mix in applesauce just prior to taking your dose.  Take Xarelto exactly as prescribed by your doctor and DO NOT stop taking Xarelto without talking to the doctor who prescribed the medication.  Stopping without other VTE prevention medication to take the place of Xarelto may increase your risk of developing a clot.  After discharge, you should have regular check-up appointments with your healthcare  provider that is prescribing your Xarelto.    What do you do if you miss a dose? If you miss a dose, take it as soon as you remember on the same day then continue your regularly scheduled once daily regimen the  next day. Do not take two doses of Xarelto on the same day.   Important Safety Information A possible side effect of Xarelto is bleeding. You should call your healthcare provider right away if you experience any of the following: Bleeding from an injury or your nose that does not stop. Unusual colored urine (red or dark brown) or unusual colored stools (red or black). Unusual bruising for unknown reasons. A serious fall or if you hit your head (even if there is no bleeding).  Some medicines may interact with Xarelto and might increase your risk of bleeding while on Xarelto. To help avoid this, consult your healthcare provider or pharmacist prior to using any new prescription or non-prescription medications, including herbals, vitamins, non-steroidal anti-inflammatory drugs (NSAIDs) and supplements.  This website has more information on Xarelto: [email or web address]

## 2022-09-10 ENCOUNTER — Encounter (HOSPITAL_COMMUNITY): Payer: Self-pay | Admitting: Orthopedic Surgery

## 2022-09-10 DIAGNOSIS — M170 Bilateral primary osteoarthritis of knee: Secondary | ICD-10-CM | POA: Diagnosis not present

## 2022-09-10 LAB — GLUCOSE, CAPILLARY
Glucose-Capillary: 121 mg/dL — ABNORMAL HIGH (ref 70–99)
Glucose-Capillary: 127 mg/dL — ABNORMAL HIGH (ref 70–99)

## 2022-09-10 LAB — CBC
HCT: 40 % (ref 36.0–46.0)
Hemoglobin: 12.8 g/dL (ref 12.0–15.0)
MCH: 28.2 pg (ref 26.0–34.0)
MCHC: 32 g/dL (ref 30.0–36.0)
MCV: 88.1 fL (ref 80.0–100.0)
Platelets: 203 10*3/uL (ref 150–400)
RBC: 4.54 MIL/uL (ref 3.87–5.11)
RDW: 13.7 % (ref 11.5–15.5)
WBC: 12.6 10*3/uL — ABNORMAL HIGH (ref 4.0–10.5)
nRBC: 0 % (ref 0.0–0.2)

## 2022-09-10 LAB — BASIC METABOLIC PANEL
Anion gap: 9 (ref 5–15)
BUN: 12 mg/dL (ref 8–23)
CO2: 23 mmol/L (ref 22–32)
Calcium: 8.5 mg/dL — ABNORMAL LOW (ref 8.9–10.3)
Chloride: 104 mmol/L (ref 98–111)
Creatinine, Ser: 0.57 mg/dL (ref 0.44–1.00)
GFR, Estimated: >60 mL/min (ref >60)
Glucose, Bld: 163 mg/dL — ABNORMAL HIGH (ref 70–99)
Potassium: 3.7 mmol/L (ref 3.5–5.1)
Sodium: 136 mmol/L (ref 135–145)

## 2022-09-10 MED ORDER — TRAMADOL HCL 50 MG PO TABS: 50.0000 mg | ORAL_TABLET | Freq: Four times a day (QID) | ORAL | 0 refills | Status: DC | PRN

## 2022-09-10 MED ORDER — METHOCARBAMOL 500 MG PO TABS: 500.0000 mg | ORAL_TABLET | Freq: Four times a day (QID) | ORAL | 0 refills | Status: DC | PRN

## 2022-09-10 MED ORDER — OXYCODONE HCL 5 MG PO TABS: 5.0000 mg | ORAL_TABLET | Freq: Four times a day (QID) | ORAL | 0 refills | Status: DC | PRN

## 2022-09-10 MED ORDER — RIVAROXABAN 10 MG PO TABS: 10.0000 mg | ORAL_TABLET | Freq: Every day | ORAL | 0 refills | Status: DC

## 2022-09-10 MED ORDER — ONDANSETRON HCL 4 MG PO TABS: 4.0000 mg | ORAL_TABLET | Freq: Four times a day (QID) | ORAL | 0 refills | Status: DC | PRN

## 2022-09-10 NOTE — Progress Notes (Signed)
   Subjective: 1 Day Post-Op Procedure(s) (LRB): TOTAL KNEE ARTHROPLASTY (Right) LEFT KNEE STEROID INJECTION (Left) Patient reports pain as mild.   Patient seen in rounds by Dr. Lequita Halt. Patient is well, and has had no acute complaints or problems  Pt ambulated 90' with PT yesterday. We will continue therapy today.  Objective: Vital signs in last 24 hours: Temp:  [97.7 F (36.5 C)-98.3 F (36.8 C)] 98 F (36.7 C) (08/20 3244) Pulse Rate:  [61-79] 61 (08/20 0613) Resp:  [13-18] 17 (08/20 0613) BP: (117-147)/(51-106) 124/57 (08/20 0613) SpO2:  [95 %-100 %] 97 % (08/20 0613) FiO2 (%):  [21 %] 21 % (08/19 2100)  Intake/Output from previous day:  Intake/Output Summary (Last 24 hours) at 09/10/2022 0713 Last data filed at 09/10/2022 0102 Gross per 24 hour  Intake 3747.61 ml  Output 4420 ml  Net -672.39 ml     Intake/Output this shift: No intake/output data recorded.  Labs: Recent Labs    09/10/22 0326  HGB 12.8   Recent Labs    09/10/22 0326  WBC 12.6*  RBC 4.54  HCT 40.0  PLT 203   Recent Labs    09/10/22 0326  NA 136  K 3.7  CL 104  CO2 23  BUN 12  CREATININE 0.57  GLUCOSE 163*  CALCIUM 8.5*   No results for input(s): "LABPT", "INR" in the last 72 hours.  Exam: General - Patient is Alert, Appropriate, and Oriented Extremity - Neurovascular intact Dorsiflexion/Plantar flexion intact Dressing - dressing C/D/I Motor Function - intact, moving foot and toes well on exam.   Past Medical History:  Diagnosis Date   Arthritis    Back pain of lumbar region with sciatica    Cancer (HCC)    breast   Depression    Diabetes mellitus, type II (HCC)    Family history of anesthesia complication    grandfather died under anesthesia 70's- had heart issues   H/O carpal tunnel repair 2014   Headache(784.0)    Hyperlipidemia    Hypertension    Hypothyroidism    Medial meniscus tear    PONV (postoperative nausea and vomiting)    Sleep apnea    cpap since 10     Assessment/Plan: 1 Day Post-Op Procedure(s) (LRB): TOTAL KNEE ARTHROPLASTY (Right) LEFT KNEE STEROID INJECTION (Left) Principal Problem:   OA (osteoarthritis) of knee  Estimated body mass index is 38.09 kg/m as calculated from the following:   Height as of this encounter: 5\' 3"  (1.6 m).   Weight as of this encounter: 97.5 kg.   Patient's anticipated LOS is less than 2 midnights, meeting these requirements: - Younger than 46 - Lives within 1 hour of care - Has a competent adult at home to recover with post-op recover - NO history of  - Chronic pain requiring opiods  - Diabetes  - Coronary Artery Disease  - Heart failure  - Heart attack  - Stroke  - DVT/VTE  - Cardiac arrhythmia  - Respiratory Failure/COPD  - Renal failure  - Anemia  - Advanced Liver disease     Up with therapy. Foley out this morning. DVT Prophylaxis - Xarelto Weight bearing as tolerated. Continue therapy.  Plan is to go Home after hospital stay. Pt will follow up in the office in 2 weeks. OPP[facility] scheduled at [facility]. The PDMP database was reviewed today prior to any opioid medications being prescribed to this patient.   Weston Brass, PA-C Orthopedic Surgery 09/10/2022, 7:13 AM

## 2022-09-10 NOTE — Progress Notes (Signed)
Physical Therapy Treatment Patient Details Name: [name] MRN: [id number] DOB: [birth year] Today's Date: 09/10/2022   History of Present Illness 62 yo female s/p R TKA on 09/09/22, L knee steroid injection. PMH: back pain, L3-4 adn l 4-5 fusion 2023, HTN, DM, Cspine arthroplasty    PT Comments  Pt progressing very well, meeting PT goals. See below for mobility. Pt and spouse feel ready to d/c home today, PT in agreement with plan. [facility]   If plan is discharge home, recommend the following: A little help with walking and/or transfers;A little help with bathing/dressing/bathroom;Help with stairs or ramp for entrance;Assistance with cooking/housework   Can travel by private vehicle        Equipment Recommendations  None recommended by PT    Recommendations for Other Services       Precautions / Restrictions Precautions Precautions: Fall;Knee Restrictions Weight Bearing Restrictions: No Other Position/Activity Restrictions: WBAT     Mobility  Bed Mobility Overal bed mobility: Needs Assistance Bed Mobility: Supine to Sit     Supine to sit: Supervision     General bed mobility comments: for safety, incr time    Transfers Overall transfer level: Needs assistance Equipment used: Rolling walker (2 wheels) Transfers: Sit to/from Stand Sit to Stand: Contact guard assist           General transfer comment: cues for hand placement and RLE position; STS from bed and toilet    Ambulation/Gait Ambulation/Gait assistance: Contact guard assist, Supervision Gait Distance (Feet): 40 Feet Assistive device: Rolling walker (2 wheels) Gait Pattern/deviations: Step-to pattern, Decreased stance time - right       General Gait Details: intermittent cues for sequence and RW position from self; demonstrates good carryover from earlier session   Stairs Stairs: Yes Stairs assistance: Min assist Stair Management: No rails,  Backwards, With walker Number of Stairs: 1 (x2) General stair comments: cues for sequence and technique; good stability, no knee buckling; pt spouse present and able to cue and return demo assist   Wheelchair Mobility     Tilt Bed    Modified Rankin (Stroke Patients Only)       Balance                                            Cognition Arousal: Alert Behavior During Therapy: WFL for tasks assessed/performed Overall Cognitive Status: Within Functional Limits for tasks assessed                                          Exercises Total Joint Exercises Ankle Circles/Pumps: AROM, Both, 10 reps Quad Sets: AROM, Both, 10 reps Heel Slides: AAROM, Right, 10 reps Straight Leg Raises: 5 reps, Right, AAROM Long Arc Quad: AROM, Right, 5 reps, Seated Knee Flexion: AROM, Right, 5 reps, Seated    General Comments        Pertinent Vitals/Pain Pain Assessment Pain Assessment: 0-10 Pain Score: 4  Pain Location: right knee Pain Descriptors / Indicators: Discomfort, Guarding, Sore Pain Intervention(s): Limited activity within patient's tolerance, Monitored during session, Premedicated before session, Repositioned    Home Living  Prior Function            PT Goals (current goals can now be found in the care plan section) Acute Rehab PT Goals PT Goal Formulation: With patient Time For Goal Achievement: 09/16/22 Potential to Achieve Goals: Good Progress towards PT goals: Progressing toward goals    Frequency    7X/week      PT Plan      Co-evaluation              AM-PAC PT "6 Clicks" Mobility   Outcome Measure  Help needed turning from your back to your side while in a flat bed without using bedrails?: A Little Help needed moving from lying on your back to sitting on the side of a flat bed without using bedrails?: A Little Help needed moving to and from a bed to a chair (including a  wheelchair)?: A Little Help needed standing up from a chair using your arms (e.g., wheelchair or bedside chair)?: A Little Help needed to walk in hospital room?: A Little Help needed climbing 3-5 steps with a railing? : A Little 6 Click Score: 18    End of Session Equipment Utilized During Treatment: Gait belt Activity Tolerance: Patient tolerated treatment well Patient left: in chair;with call bell/phone within reach;with chair alarm set;with family/visitor present Nurse Communication: Mobility status PT Visit Diagnosis: Other abnormalities of gait and mobility (R26.89);Difficulty in walking, not elsewhere classified (R26.2)     Time: 1420-1450 PT Time Calculation (min) (ACUTE ONLY): 30 min  Charges:    $Gait Training: 23-37 mins PT General Charges $$ ACUTE PT VISIT: 1 Visit                     Riyah Bardon, PT  Acute Rehab Dept [facility]) [phone]  09/10/2022    [facility] 09/10/2022, 3:36 PM

## 2022-09-10 NOTE — Plan of Care (Signed)
Pt ready to DC home with husband 

## 2022-09-10 NOTE — Progress Notes (Signed)
Physical Therapy Treatment Patient Details Name: [name] MRN: [id number] DOB: Jun 07, 1960 Today's Date: 09/10/2022   History of Present Illness 62 yo female s/p R TKA on 09/09/22, L knee steroid injection. PMH: back pain, L3-4 adn l 4-5 fusion 2023, HTN, DM, Cspine arthroplasty    PT Comments  Pt progressing well this session, incr gait distance and quad activation improved. Will see for a second session this pm, pt motivated to d/c home today    If plan is discharge home, recommend the following: A little help with walking and/or transfers;A little help with bathing/dressing/bathroom;Help with stairs or ramp for entrance;Assistance with cooking/housework   Can travel by private vehicle        Equipment Recommendations  None recommended by PT    Recommendations for Other Services       Precautions / Restrictions Precautions Precautions: Fall;Knee Restrictions Weight Bearing Restrictions: No Other Position/Activity Restrictions: WBAT     Mobility  Bed Mobility Overal bed mobility: Needs Assistance Bed Mobility: Supine to Sit     Supine to sit: Supervision     General bed mobility comments: for safety, incr time    Transfers Overall transfer level: Needs assistance Equipment used: Rolling walker (2 wheels) Transfers: Sit to/from Stand Sit to Stand: Contact guard assist           General transfer comment: cues for hand placement and RLE position; STS from bed and toilet    Ambulation/Gait Ambulation/Gait assistance: Contact guard assist Gait Distance (Feet): 55 Feet Assistive device: Rolling walker (2 wheels) Gait Pattern/deviations: Step-to pattern, Decreased stance time - right       General Gait Details: cues for sequence and RW position from self   Stairs             Wheelchair Mobility     Tilt Bed    Modified Rankin (Stroke Patients Only)       Balance                                             Cognition Arousal: Alert Behavior During Therapy: WFL for tasks assessed/performed Overall Cognitive Status: Within Functional Limits for tasks assessed                                          Exercises Total Joint Exercises Ankle Circles/Pumps: AROM, Both, 10 reps Quad Sets: AROM, Both, 10 reps Straight Leg Raises: 5 reps, Right, AAROM Long Arc Quad: AROM, Right, 5 reps, Seated Knee Flexion: AROM, Right, 5 reps, Seated    General Comments        Pertinent Vitals/Pain      Home Living                          Prior Function            PT Goals (current goals can now be found in the care plan section) Acute Rehab PT Goals PT Goal Formulation: With patient Time For Goal Achievement: 09/16/22 Potential to Achieve Goals: Good Progress towards PT goals: Progressing toward goals    Frequency    7X/week      PT Plan      Co-evaluation  AM-PAC PT "6 Clicks" Mobility   Outcome Measure  Help needed turning from your back to your side while in a flat bed without using bedrails?: A Little Help needed moving from lying on your back to sitting on the side of a flat bed without using bedrails?: A Little Help needed moving to and from a bed to a chair (including a wheelchair)?: A Little Help needed standing up from a chair using your arms (e.g., wheelchair or bedside chair)?: A Little Help needed to walk in hospital room?: A Little Help needed climbing 3-5 steps with a railing? : A Little 6 Click Score: 18    End of Session Equipment Utilized During Treatment: Gait belt Activity Tolerance: Patient tolerated treatment well Patient left: in chair;with call bell/phone within reach;with chair alarm set;with family/visitor present Nurse Communication: Mobility status PT Visit Diagnosis: Other abnormalities of gait and mobility (R26.89);Difficulty in walking, not elsewhere classified (R26.2)     Time: 1610-9604 PT Time  Calculation (min) (ACUTE ONLY): 43 min  Charges:    $Gait Training: 23-37 mins $Therapeutic Exercise: 8-22 mins PT General Charges $$ ACUTE PT VISIT: 1 Visit                     Tavyn Kurka, PT  Acute Rehab Dept [facility]) [phone]  09/10/2022    [facility] 09/10/2022, 10:55 AM

## 2022-09-10 NOTE — TOC Transition Note (Signed)
Transition of Care [facility]) - CM/SW Discharge Note  Patient Details  Name: [name] MRN: [id number] Date of Birth: Sep 10, 1960  [facility] Care [facility]) CM/SW Contact:  Ewing Schlein, LCSW Phone Number: 09/10/2022, 9:48 AM  Clinical Narrative: Patient is expected to discharge home after working with PT. CSW met with patient to confirm discharge plan and needs. Patient will go home with OPPT at [facility] PT. Patient has a rolling walker and a 3N1 at home, so there are no DME needs at this time. TOC signing off.  Final next level of care: OP Rehab Barriers to Discharge: No Barriers Identified  Patient Goals and CMS Choice Choice offered to / list presented to : NA  Discharge Plan and Services Additional resources added to the After Visit Summary for           DME Arranged: N/A DME Agency: NA  Social Determinants of Health (SDOH) Interventions SDOH Screenings   Food Insecurity: No Food Insecurity (09/09/2022)  Housing: Low Risk  (09/09/2022)  Transportation Needs: No Transportation Needs (09/09/2022)  Utilities: Not At Risk (09/09/2022)  Depression (PHQ2-9): Medium Risk (08/30/2022)  Social Connections: Unknown (06/04/2021)   Received from [facility]  Tobacco Use: Low Risk  (09/09/2022)   Readmission Risk Interventions     No data to display

## 2022-09-11 NOTE — Therapy (Signed)
OUTPATIENT PHYSICAL THERAPY LOWER EXTREMITY EVALUATION   Patient Name: [name] MRN: [id number] DOB:1960-03-26, 62 y.o., female Today's Date: 09/12/2022  END OF SESSION:  PT End of Session - 09/12/22 1632     Visit Number 1    Date for PT Re-Evaluation 12/05/22    Authorization Type [employer]    PT Start Time 1630    PT Stop Time 1715    PT Time Calculation (min) 45 min    Equipment Utilized During Treatment Gait belt    Activity Tolerance Patient limited by pain    Behavior During Therapy WFL for tasks assessed/performed             Past Medical History:  Diagnosis Date   Arthritis    Back pain of lumbar region with sciatica    Cancer (HCC)    breast   Depression    Diabetes mellitus, type II (HCC)    Family history of anesthesia complication    grandfather died under anesthesia 70's- had heart issues   H/O carpal tunnel repair 2014   Headache(784.0)    Hyperlipidemia    Hypertension    Hypothyroidism    Medial meniscus tear    PONV (postoperative nausea and vomiting)    Sleep apnea    cpap since 10   Past Surgical History:  Procedure Laterality Date   BILATERAL TOTAL MASTECTOMY WITH AXILLARY LYMPH NODE DISSECTION     BREAST RECONSTRUCTION     CERVICAL DISC ARTHROPLASTY N/A 03/19/2013   Procedure: CERVICAL FIVE TO SIX, CERVICAL SIX TO SEVEN CERVICAL ANTERIOR DISC ARTHROPLASTY;  Surgeon: Barnett Abu, MD;  Location: [facility];  Service: Neurosurgery;  Laterality: N/A;  C56 C67 artificial disc replacement   CESAREAN SECTION     COLONOSCOPY     DILATATION & CURETTAGE/HYSTEROSCOPY WITH TRUECLEAR N/A 11/25/2013   Procedure: DILATATION & CURETTAGE/HYSTEROSCOPY WITH TRUCLEAR;  Surgeon: Meriel Pica, MD;  Location: [facility] ORS;  Service: Gynecology;  Laterality: N/A;   ECTOPIC PREGNANCY SURGERY     GALLBLADDER SURGERY     MOUTH SURGERY     child- dog bite   SPINAL FUSION     2023   STERIOD INJECTION Left 09/09/2022   Procedure: LEFT KNEE STEROID INJECTION;   Surgeon: Ollen Gross, MD;  Location: [facility] ORS;  Service: Orthopedics;  Laterality: Left;  left knee injection 0928   TONSILLECTOMY     TOTAL KNEE ARTHROPLASTY Right 09/09/2022   Procedure: TOTAL KNEE ARTHROPLASTY;  Surgeon: Ollen Gross, MD;  Location: [facility] ORS;  Service: Orthopedics;  Laterality: Right;   TUBAL LIGATION     Patient Active Problem List   Diagnosis Date Noted   OA (osteoarthritis) of knee 09/09/2022   Exertional dyspnea 04/17/2022   Elevated coronary artery calcium score 04/17/2022   Spondylolisthesis at L4-L5 level 12/27/2021   Ductal carcinoma in situ (DCIS) of left breast 01/19/2018   Generalized anxiety disorder 11/07/2017   Depression 11/07/2017   Insomnia 11/07/2017   Cervical spondylosis 03/19/2013    PCP: Creola Corn  REFERRING PROVIDER: Trudee Grip  REFERRING DIAG: R TKA 09/09/22  THERAPY DIAG:  S/P total knee arthroplasty, right  Acute pain of right knee  Stiffness of right knee, not elsewhere classified  Muscle weakness (generalized)  Other abnormalities of gait and mobility  Rationale for Evaluation and Treatment: Rehabilitation  ONSET DATE: 09/09/22  SUBJECTIVE:   SUBJECTIVE STATEMENT: I got knee 3 days ago. The last 3 days have been painful. I took 40 mins to get out  my house. I have been icing a few times a day but probably not as much.   PERTINENT HISTORY: R TKA 8/19 L knee steroid injection 09/09/22  PAIN:  Are you having pain? Yes: NPRS scale: 10/10 Pain location: R knee Pain description: sharp when I use it, if I am stationary it is dull and constant Aggravating factors: weight bearing on the RLE, bending, moving it  Relieving factors: ice helps some, pain meds some but I don't think it is enough  PRECAUTIONS: None  RED FLAGS: None   WEIGHT BEARING RESTRICTIONS: No  FALLS:  Has patient fallen in last 6 months? No  LIVING ENVIRONMENT: Lives with: lives with their family and lives with their spouse Lives in:  House/apartment Stairs: Yes: External: 3 steps; none Has following equipment at home: Single point cane and Walker - 2 wheeled  OCCUPATION: Not working  PLOF: Independent  PATIENT GOALS: No pain, to keep the knee moving  NEXT MD VISIT: 09/24/22  OBJECTIVE:   PATIENT SURVEYS:  FOTO 4  COGNITION: Overall cognitive status: Within functional limits for tasks assessed      EDEMA:  Increased swelling surrounding R knee  POSTURE: rounded shoulders and flexed trunk   PALPATION: Warm and TTP  LOWER EXTREMITY ROM:  Active ROM Right eval Left eval  Hip flexion    Hip extension    Hip abduction    Hip adduction    Hip internal rotation    Hip external rotation    Knee flexion  60   Knee extension -30   Ankle dorsiflexion    Ankle plantarflexion    Ankle inversion    Ankle eversion     (Blank rows = not tested)  LOWER EXTREMITY MMT:  MMT Right eval Left eval  Hip flexion 2   Hip extension    Hip abduction    Hip adduction    Hip internal rotation    Hip external rotation    Knee flexion 2   Knee extension 2   Ankle dorsiflexion    Ankle plantarflexion    Ankle inversion    Ankle eversion     (Blank rows = not tested)   FUNCTIONAL TESTS:  5 times sit to stand: unable to do  Timed up and go (TUG): 1 min 28s  GAIT: Distance walked: in clinic distances Assistive device utilized: Environmental consultant - 2 wheeled Level of assistance: SBA Comments: antalgic gait, slowed cadence, step to pattern, decreased step length and time on RLE    TODAY'S TREATMENT:                                                                                                                              DATE: EVAL- 09/12/22    PATIENT EDUCATION:  Education details: POC and HEP Person educated: Patient Education method: Explanation Education comprehension: verbalized understanding  HOME EXERCISE PROGRAM: Access Code: [email or web address] URL: [email or web address] Date:  09/12/2022 Prepared by: Cassie Freer  Exercises -  Seated Heel Slide  - 1 x daily - 7 x weekly - 2 sets - 10 reps - Supine Quad Set  - 1 x daily - 7 x weekly - 2 sets - 10 reps - Supine Ankle Pumps  - 1 x daily - 7 x weekly - 2 sets - 10 reps  ASSESSMENT:  CLINICAL IMPRESSION: Patient is a 62 y.o. female who was seen today for physical therapy evaluation and treatment for R knee replacement. She is 3 days post-op at the time of eval so we were pretty limited in what we could do. She is currently ambulating with a RW and CGA. She presents with obvious impairments in strength and ROM, and has high levels of pain. Was educated about trying to ice and elevate throughout the day. Patient is unable to complete sit to stand test today. She has increased pain with any weight bearing through the RLE. She will benefit from skilled PT to address her R knee pain and impairments to be able to return to PLOF.  OBJECTIVE IMPAIRMENTS: Abnormal gait, difficulty walking, decreased ROM, decreased strength, and pain.   ACTIVITY LIMITATIONS: bending, sitting, standing, squatting, sleeping, stairs, transfers, bathing, toileting, and locomotion level  PARTICIPATION LIMITATIONS: meal prep, cleaning, laundry, driving, shopping, and community activity  REHAB POTENTIAL: Good  CLINICAL DECISION MAKING: Stable/uncomplicated  EVALUATION COMPLEXITY: Low   GOALS: Goals reviewed with patient? Yes  SHORT TERM GOALS: Target date: 10/24/22  Patient will be independent with initial HEP. Goal status: INITIAL  2.  Patient will be do TUG < 30s Baseline: 1 min 28s  Goal status: INITIAL  3.  Patient will be able to do 5 sit to stands  Baseline: unable to do  Goal status: INITIAL   LONG TERM GOALS: Target date: 12/05/22  Patient will be independent with advanced/ongoing HEP to improve outcomes and carryover.  Goal status: INITIAL  2.  Patient will report at least 75% improvement in R knee pain to improve  QOL. Baseline: 10/10 Goal status: INITIAL  3.  Patient will demonstrate improved R knee AROM to >/= 0-120 deg to allow for normal gait and stair mechanics. Baseline: 30-0-60 Goal status: INITIAL  4.  Patient will demonstrate improved functional LE strength as demonstrated by 5/5. Baseline: 2/5 Goal status: INITIAL  5.  Patient will be able to ambulate 500' with LRAD and normal gait pattern without increased pain to access community.  Baseline: using RW small in home ambulation distances Goal status: INITIAL  6.  Patient will report 100 on FOTO (patient reported outcome measure) to demonstrate improved functional ability. Baseline: 4 Goal status: INITIAL  PLAN:  PT FREQUENCY: 2x/week  PT DURATION: 12 weeks  PLANNED INTERVENTIONS: Therapeutic exercises, Therapeutic activity, Neuromuscular re-education, Balance training, Gait training, Patient/Family education, Self Care, Joint mobilization, Stair training, Electrical stimulation, Cryotherapy, Moist heat, scar mobilization, Vasopneumatic device, and Manual therapy  PLAN FOR NEXT SESSION: PROM, AAROM to R knee, vaso for pain and swelling   Cassie Freer, PT 09/12/2022, 5:45 PM

## 2022-09-12 ENCOUNTER — Encounter: Payer: Self-pay | Admitting: Professional Counselor

## 2022-09-12 ENCOUNTER — Ambulatory Visit: Payer: No Typology Code available for payment source | Attending: Orthopedic Surgery

## 2022-09-12 DIAGNOSIS — M25561 Pain in right knee: Secondary | ICD-10-CM | POA: Diagnosis present

## 2022-09-12 DIAGNOSIS — R2689 Other abnormalities of gait and mobility: Secondary | ICD-10-CM | POA: Diagnosis present

## 2022-09-12 DIAGNOSIS — Z96651 Presence of right artificial knee joint: Secondary | ICD-10-CM | POA: Diagnosis present

## 2022-09-12 DIAGNOSIS — M6281 Muscle weakness (generalized): Secondary | ICD-10-CM | POA: Diagnosis present

## 2022-09-12 DIAGNOSIS — M25661 Stiffness of right knee, not elsewhere classified: Secondary | ICD-10-CM

## 2022-09-12 NOTE — Progress Notes (Signed)
      [facility] Counselor/Therapist Progress Note  Patient ID: [name], MRN: [id number],    Date: 8.16.2024  Time Spent: 4:15p - 5:17p   Treatment Type: Family with patient   Spouse present with patient  Reported Symptoms: worries, stress, interpersonal concerns   Mental Status Exam:  Appearance:   Neat     Behavior:  Appropriate, Sharing, and Motivated  Motor:  Normal  Speech/Language:   Clear and Coherent and Normal Rate  Affect:  Appropriate and Congruent  Mood:  normal  Thought process:  normal  Thought content:    WNL  Sensory/Perceptual disturbances:    WNL  Orientation:  oriented to person, place, time/date, and situation  Attention:  Good  Concentration:  Good  Memory:  WNL  Fund of knowledge:   Good  Insight:    Good  Judgment:   Good  Impulse Control:  Good   Risk Assessment: Danger to Self:  No Self-injurious Behavior: No Danger to Others: No Duty to Warn:no Physical Aggression / Violence:No  Access to Firearms a concern: No  Gang Involvement:No   Subjective: Pt presented to session with husband. Counselor and pt continued to build rapport and to assess symptoms and hx, including marital relationship hx, which pt reports related strain as implicating symptomology across time. Pt spouse voiced perspective on relationship trajectory and issues, and sense of wife's well-being as relates. He voiced what he feels to be his part in dynamics of concern. Both shared regarding acute episode of distress from last year that led to pt hospitalization. Counselor affirmed both persons resiliency and their strengths. Pt identified desire to be seen in relationship; pt spouse identified desire to be heard. Counselor helped to facilitate insight around issues, and helped couple to create plan for focus on their bids, asking themselves if they are approaching one another in ways that their loved one is being seen and/or heard in different instances. Counselor worked wish  couple to Chief Technology Officer around upcoming circumstance of pt surgery, including emotional safety and external support resourcing. Pt reported worry but overall hopefulness regarding upcoming surgery; she noted hope for relief from pain and improvement of functionality.   Interventions: Solution-Oriented/Positive Psychology, Humanistic/Existential, Insight-Oriented, and Family Systems  Diagnosis:No diagnosis found.  Plan: Pt is scheduled for a follow-up once surgery recovery is stabilized. Continue building rapport, process work, Conservation officer, historic buildings.   Gaspar Bidding, [facility]

## 2022-09-12 NOTE — Discharge Summary (Signed)
Physician Discharge Summary   Patient ID: [name] MRN: [id number] DOB/AGE: 62-23-1962 62 y.o.  Admit date: 09/09/2022 Discharge date: 09/10/2022  Primary Diagnosis: Osteoarthritis of bilateral knees  Admission Diagnoses:  Past Medical History:  Diagnosis Date   Arthritis    Back pain of lumbar region with sciatica    Cancer (HCC)    breast   Depression    Diabetes mellitus, type II (HCC)    Family history of anesthesia complication    grandfather died under anesthesia 70's- had heart issues   H/O carpal tunnel repair 2014   Headache(784.0)    Hyperlipidemia    Hypertension    Hypothyroidism    Medial meniscus tear    PONV (postoperative nausea and vomiting)    Sleep apnea    cpap since 10   Discharge Diagnoses:   Principal Problem:   OA (osteoarthritis) of knee  Estimated body mass index is 38.09 kg/m as calculated from the following:   Height as of this encounter: 5\' 3"  (1.6 m).   Weight as of this encounter: 97.5 kg.  Procedure:  Procedure(s) (LRB): TOTAL KNEE ARTHROPLASTY (Right) LEFT KNEE STEROID INJECTION (Left)   Consults: None  HPI: [name] is a 62 y.o. year old female with end stage OA of her right knee with progressively worsening pain and dysfunction. She has constant pain, with activity and at rest and significant functional deficits with difficulties even with ADLs. She has had extensive non-op management including analgesics, injections of cortisone and viscosupplements, and home exercise program, but remains in significant pain with significant dysfunction.Radiographs show bone on bone arthritis medial and patellofemoral. She presents now for right Total Knee Arthroplasty. She also has advance osteoarthritis left knee and requests cortisone injection   Laboratory Data: Admission on 09/09/2022, Discharged on 09/10/2022  Component Date Value Ref Range Status   Hgb A1c MFr Bld 09/09/2022 5.9 (H)  4.8 - 5.6 % Final   Comment: (NOTE) Pre  diabetes:          5.7%-6.4%  Diabetes:              >6.4%  Glycemic control for   <7.0% adults with diabetes    Mean Plasma Glucose 09/09/2022 122.63  mg/dL Final   Performed at [facility], [address] [place], [place] [address]   MRSA, PCR 09/09/2022 NEGATIVE  NEGATIVE Final   Staphylococcus aureus 09/09/2022 NEGATIVE  NEGATIVE Final   Comment: (NOTE) The Xpert SA Assay (FDA approved for NASAL specimens in patients 67 years of age and older), is one component of a comprehensive surveillance program. It is not intended to diagnose infection nor to guide or monitor treatment. Performed at [facility], [address] [place], [place] [address]    Glucose-Capillary 09/09/2022 161 (H)  70 - 99 mg/dL Final   Glucose reference range applies only to samples taken after fasting for at least 8 hours.   Comment 1 09/09/2022 Notify RN   Final   Comment 2 09/09/2022 Document in Chart   Final   Glucose-Capillary 09/09/2022 111 (H)  70 - 99 mg/dL Final   Glucose reference range applies only to samples taken after fasting for at least 8 hours.   Glucose-Capillary 09/09/2022 172 (H)  70 - 99 mg/dL Final   Glucose reference range applies only to samples taken after fasting for at least 8 hours.   WBC 09/10/2022 12.6 (H)  4.0 - 10.5 K/uL Final   RBC 09/10/2022 4.54  3.87 - 5.11 MIL/uL Final   Hemoglobin 09/10/2022 12.8  12.0 - 15.0 g/dL Final   HCT 96/29/5284 40.0  36.0 - 46.0 % Final   MCV 09/10/2022 88.1  80.0 - 100.0 fL Final   MCH 09/10/2022 28.2  26.0 - 34.0 pg Final   MCHC 09/10/2022 32.0  30.0 - 36.0 g/dL Final   RDW 13/24/4010 13.7  11.5 - 15.5 % Final   Platelets 09/10/2022 203  150 - 400 K/uL Final   nRBC 09/10/2022 0.0  0.0 - 0.2 % Final   Performed at [facility], [address], [place], [place] [address]   Sodium 09/10/2022 136  135 - 145 mmol/L Final   Potassium 09/10/2022 3.7  3.5 - 5.1 mmol/L Final   Chloride 09/10/2022 104   98 - 111 mmol/L Final   CO2 09/10/2022 23  22 - 32 mmol/L Final   Glucose, Bld 09/10/2022 163 (H)  70 - 99 mg/dL Final   Glucose reference range applies only to samples taken after fasting for at least 8 hours.   BUN 09/10/2022 12  8 - 23 mg/dL Final   Creatinine, Ser 09/10/2022 0.57  0.44 - 1.00 mg/dL Final   Calcium 66/44/0347 8.5 (L)  8.9 - 10.3 mg/dL Final   GFR, Estimated 09/10/2022 >60  >60 mL/min Final   Comment: (NOTE) Calculated using the CKD-EPI Creatinine Equation (2021)    Anion gap 09/10/2022 9  5 - 15 Final   Performed at [facility] [address] [place], [place] [address]   Glucose-Capillary 09/09/2022 215 (H)  70 - 99 mg/dL Final   Glucose reference range applies only to samples taken after fasting for at least 8 hours.   Glucose-Capillary 09/09/2022 189 (H)  70 - 99 mg/dL Final   Glucose reference range applies only to samples taken after fasting for at least 8 hours.   Glucose-Capillary 09/10/2022 121 (H)  70 - 99 mg/dL Final   Glucose reference range applies only to samples taken after fasting for at least 8 hours.   Glucose-Capillary 09/10/2022 127 (H)  70 - 99 mg/dL Final   Glucose reference range applies only to samples taken after fasting for at least 8 hours.  Hospital Outpatient Visit on 08/28/2022  Component Date Value Ref Range Status   Sodium 08/28/2022 139  135 - 145 mmol/L Final   Potassium 08/28/2022 3.9  3.5 - 5.1 mmol/L Final   Chloride 08/28/2022 105  98 - 111 mmol/L Final   CO2 08/28/2022 25  22 - 32 mmol/L Final   Glucose, Bld 08/28/2022 111 (H)  70 - 99 mg/dL Final   Glucose reference range applies only to samples taken after fasting for at least 8 hours.   BUN 08/28/2022 19  8 - 23 mg/dL Final   Creatinine, Ser 08/28/2022 0.63  0.44 - 1.00 mg/dL Final   Calcium 63/87/5643 9.4  8.9 - 10.3 mg/dL Final   GFR, Estimated 08/28/2022 >60  >60 mL/min Final   Comment: (NOTE) Calculated using the CKD-EPI Creatinine Equation  (2021)    Anion gap 08/28/2022 9  5 - 15 Final   Performed at [facility], [address] [place], [place] [address]   WBC 08/28/2022 6.8  4.0 - 10.5 K/uL Final   RBC 08/28/2022 5.46 (H)  3.87 - 5.11 MIL/uL Final   Hemoglobin 08/28/2022 15.5 (H)  12.0 - 15.0 g/dL Final   HCT 88/41/6606 46.9 (H)  36.0 - 46.0 % Final  MCV 08/28/2022 85.9  80.0 - 100.0 fL Final   MCH 08/28/2022 28.4  26.0 - 34.0 pg Final   MCHC 08/28/2022 33.0  30.0 - 36.0 g/dL Final   RDW 04/54/0981 14.0  11.5 - 15.5 % Final   Platelets 08/28/2022 234  150 - 400 K/uL Final   nRBC 08/28/2022 0.0  0.0 - 0.2 % Final   Performed at [facility], [address] [place], [place] [address]   Hgb A1c MFr Bld 08/28/2022 5.6  4.8 - 5.6 % Final   Comment: (NOTE) Pre diabetes:          5.7%-6.4%  Diabetes:              >6.4%  Glycemic control for   <7.0% adults with diabetes    Mean Plasma Glucose 08/28/2022 114.02  mg/dL Final   Performed at [facility], [address], [place], [place] [address]   Glucose-Capillary 08/28/2022 131 (H)  70 - 99 mg/dL Final   Glucose reference range applies only to samples taken after fasting for at least 8 hours.     X-Rays:No results found.  EKG: Orders placed or performed in visit on 04/17/22   EKG 12-Lead     Hospital Course: [name] is a 62 y.o. who was admitted to [facility]. They were brought to the operating room on 09/09/2022 and underwent Procedure(s): TOTAL KNEE ARTHROPLASTY LEFT KNEE STEROID INJECTION.  Patient tolerated the procedure well and was later transferred to the recovery room and then to the orthopaedic floor for postoperative care. They were given PO and IV analgesics for pain control following their surgery. They were given 24 hours of postoperative antibiotics of  Anti-infectives (From admission, onward)    Start     Dose/Rate Route Frequency Ordered Stop   09/09/22 1400  ceFAZolin (ANCEF) IVPB  2g/100 mL premix        2 g 200 mL/hr over 30 Minutes Intravenous Every 6 hours 09/09/22 1120 09/09/22 2225   09/09/22 0630  ceFAZolin (ANCEF) IVPB 2g/100 mL premix        2 g 200 mL/hr over 30 Minutes Intravenous On call to O.R. 09/09/22 2130 09/09/22 0843      and started on DVT prophylaxis in the form of Xarelto.   PT and OT were ordered for total joint protocol. Discharge planning consulted to help with postop disposition and equipment needs.  Patient had an uneventful night on the evening of surgery. They started to get up OOB with therapy on POD 0. Pt was seen during rounds and was ready to go home pending progress with therapy. She worked with therapy on POD #1 and was meeting her goals. Pt was discharged to home later that day in stable condition.  Diet: Regular diet Activity: WBAT Follow-up: in 2 weeks Disposition: Home Discharged Condition: good   Discharge Instructions     Call MD / Call 911   Complete by: As directed    If you experience chest pain or shortness of breath, CALL 911 and be transported to the hospital emergency room.  If you develope a fever above 101 F, pus (white drainage) or increased drainage or redness at the wound, or calf pain, call your surgeon's office.   Change dressing   Complete by: As directed    You may remove the bulky bandage (ACE wrap and gauze) two days after surgery. You will have an adhesive waterproof bandage underneath. Leave this in place  until your first follow-up appointment.   Constipation Prevention   Complete by: As directed    Drink plenty of fluids.  Prune juice may be helpful.  You may use a stool softener, such as Colace (over the counter) 100 mg twice a day.  Use MiraLax (over the counter) for constipation as needed.   Diet - low sodium heart healthy   Complete by: As directed    Do not put a pillow under the knee. Place it under the heel.   Complete by: As directed    Driving restrictions   Complete by: As directed    No  driving for two weeks   Post-operative opioid taper instructions:   Complete by: As directed    POST-OPERATIVE OPIOID TAPER INSTRUCTIONS: It is important to wean off of your opioid medication as soon as possible. If you do not need pain medication after your surgery it is ok to stop day one. Opioids include: Codeine, Hydrocodone(Norco, Vicodin), Oxycodone(Percocet, oxycontin) and hydromorphone amongst others.  Long term and even short term use of opiods can cause: Increased pain response Dependence Constipation Depression Respiratory depression And more.  Withdrawal symptoms can include Flu like symptoms Nausea, vomiting And more Techniques to manage these symptoms Hydrate well Eat regular healthy meals Stay active Use relaxation techniques(deep breathing, meditating, yoga) Do Not substitute Alcohol to help with tapering If you have been on opioids for less than two weeks and do not have pain than it is ok to stop all together.  Plan to wean off of opioids This plan should start within one week post op of your joint replacement. Maintain the same interval or time between taking each dose and first decrease the dose.  Cut the total daily intake of opioids by one tablet each day Next start to increase the time between doses. The last dose that should be eliminated is the evening dose.      TED hose   Complete by: As directed    Use stockings (TED hose) for three weeks on both leg(s).  You may remove them at night for sleeping.   Weight bearing as tolerated   Complete by: As directed       Allergies as of 09/10/2022       Reactions   Effexor [venlafaxine Hcl]    Weight loss and cant sleep   Effexor [venlafaxine] Other (See Comments)   Other Reaction(s): anxiety/ extreme weight loss   Flexeril [cyclobenzaprine] Hives, Other (See Comments)   Linzess [linaclotide] Diarrhea        Medication List     STOP taking these medications    aspirin EC 81 MG tablet    naproxen sodium 220 MG tablet Commonly known as: ALEVE   OSTEO ADVANCE PO   tiZANidine 4 MG tablet Commonly known as: ZANAFLEX       TAKE these medications    ALPRAZolam 0.5 MG tablet Commonly known as: XANAX Take 1/2-1 tablet three times daily as needed for anxiety or insomnia What changed:  how much to take how to take this when to take this additional instructions   atorvastatin 20 MG tablet Commonly known as: LIPITOR Take 1 tablet (20 mg total) by mouth daily.   BOTOX IM Inject 1 Dose into the muscle every 3 (three) months. FOR MIGRAINES   DayVigo 10 MG Tabs Generic drug: Lemborexant Take 1 tablet (10 mg total) by mouth at bedtime. Start taking on: September 28, 2022   doxazosin 4 MG tablet Commonly known as:  CARDURA Take 1 tablet (4 mg total) by mouth at bedtime.   famotidine-calcium carbonate-magnesium hydroxide 10-800-165 MG chewable tablet Commonly known as: PEPCID COMPLETE Chew 1 tablet by mouth daily as needed (with aleve for pain.).   gabapentin 100 MG capsule Commonly known as: NEURONTIN Take 1 capsule (100 mg total) by mouth 2 (two) times daily as needed (anxiety). What changed:  how much to take when to take this   gabapentin 300 MG capsule Commonly known as: NEURONTIN Take 1 capsule (300 mg total) by mouth at bedtime. Start taking on: September 16, 2022 What changed: Another medication with the same name was changed. Make sure you understand how and when to take each.   Galcanezumab-gnlm 120 MG/ML Soaj Inject 120 mg into the skin every 30 (thirty) days.   GOODSENSE PSYLLIUM FIBER PO Take 4 capsules by mouth every Monday, Tuesday, Wednesday, Thursday, and Friday.   hydrochlorothiazide 12.5 MG capsule Commonly known as: MICROZIDE Take 12.5 mg by mouth in the morning.   Jardiance 10 MG Tabs tablet Generic drug: empagliflozin Take 10 mg by mouth in the morning.   Metamucil Fiber Chew Chew 1 tablet by mouth every Monday, Tuesday,  Wednesday, Thursday, and Friday.   metFORMIN 500 MG tablet Commonly known as: GLUCOPHAGE Take 500 mg by mouth daily with supper.   methocarbamol 500 MG tablet Commonly known as: ROBAXIN Take 1 tablet (500 mg total) by mouth every 6 (six) hours as needed for muscle spasms. What changed:  when to take this reasons to take this additional instructions   metoprolol succinate 100 MG 24 hr tablet Commonly known as: TOPROL-XL Take 100 mg by mouth at bedtime.   mometasone 0.1 % lotion Commonly known as: ELOCON 5 drops daily. Instill 5 drops into right ear daily   ondansetron 4 MG tablet Commonly known as: ZOFRAN Take 1 tablet (4 mg total) by mouth every 6 (six) hours as needed for nausea.   Opcon-A 0.027-0.315 % Soln Generic drug: Naphazoline-Pheniramine Place 1-2 drops into both eyes 3 (three) times daily as needed (irritated/allergy eyes).   oxyCODONE 5 MG immediate release tablet Commonly known as: Oxy IR/ROXICODONE Take 1-2 tablets (5-10 mg total) by mouth every 6 (six) hours as needed for severe pain.   rivaroxaban 10 MG Tabs tablet Commonly known as: XARELTO Take 1 tablet (10 mg total) by mouth daily with breakfast for 20 days.   rizatriptan 10 MG disintegrating tablet Commonly known as: MAXALT-MLT Take 10 mg by mouth as needed for migraine.   thyroid 30 MG tablet Commonly known as: ARMOUR Take 30 mg by mouth daily before breakfast.   traMADol 50 MG tablet Commonly known as: ULTRAM Take 1-2 tablets (50-100 mg total) by mouth every 6 (six) hours as needed for moderate pain.   Ubrelvy 50 MG Tabs Generic drug: Ubrogepant Take 50 mg by mouth daily as needed (migraine).   Vilazodone HCl 20 MG Tabs Take 1 tablet (20 mg total) by mouth daily with breakfast.   VISINE OP Place 1 drop into both eyes daily as needed (irritation).               Discharge Care Instructions  (From admission, onward)           Start     Ordered   09/10/22 0000  Weight  bearing as tolerated        09/10/22 0740   09/10/22 0000  Change dressing       Comments: You may remove the bulky bandage (  ACE wrap and gauze) two days after surgery. You will have an adhesive waterproof bandage underneath. Leave this in place until your first follow-up appointment.   09/10/22 0740            Follow-up Information     Ollen Gross, MD. Schedule an appointment as soon as possible for a visit in 2 week(s).   Specialty: Orthopedic Surgery Contact information: [address] [place] [address] [place] [address] [phone]                 Signed: Weston Brass, PA-C Orthopedic Surgery 09/12/2022, 11:57 AM

## 2022-09-17 ENCOUNTER — Ambulatory Visit: Payer: No Typology Code available for payment source | Admitting: Physical Therapy

## 2022-09-17 ENCOUNTER — Encounter: Payer: Self-pay | Admitting: Physical Therapy

## 2022-09-17 DIAGNOSIS — Z96651 Presence of right artificial knee joint: Secondary | ICD-10-CM | POA: Diagnosis not present

## 2022-09-17 DIAGNOSIS — M25661 Stiffness of right knee, not elsewhere classified: Secondary | ICD-10-CM

## 2022-09-17 DIAGNOSIS — M25561 Pain in right knee: Secondary | ICD-10-CM

## 2022-09-17 DIAGNOSIS — M6281 Muscle weakness (generalized): Secondary | ICD-10-CM

## 2022-09-17 NOTE — Therapy (Signed)
OUTPATIENT PHYSICAL THERAPY LOWER EXTREMITY TREATMENT   Patient Name: [name] MRN: [id number] DOB:March 10, 1960, 62 y.o., female Today's Date: 09/17/2022  END OF SESSION:  PT End of Session - 09/17/22 1704     Visit Number 2    Date for PT Re-Evaluation 12/05/22    Authorization Type [employer]    PT Start Time 1605    PT Stop Time 1644    PT Time Calculation (min) 39 min    Activity Tolerance Patient limited by pain;Patient tolerated treatment well    Behavior During Therapy [facility] for tasks assessed/performed              Past Medical History:  Diagnosis Date   Arthritis    Back pain of lumbar region with sciatica    Cancer (HCC)    breast   Depression    Diabetes mellitus, type II (HCC)    Family history of anesthesia complication    grandfather died under anesthesia 70's- had heart issues   H/O carpal tunnel repair 2014   Headache(784.0)    Hyperlipidemia    Hypertension    Hypothyroidism    Medial meniscus tear    PONV (postoperative nausea and vomiting)    Sleep apnea    cpap since 10   Past Surgical History:  Procedure Laterality Date   BILATERAL TOTAL MASTECTOMY WITH AXILLARY LYMPH NODE DISSECTION     BREAST RECONSTRUCTION     CERVICAL DISC ARTHROPLASTY N/A 03/19/2013   Procedure: CERVICAL FIVE TO SIX, CERVICAL SIX TO SEVEN CERVICAL ANTERIOR DISC ARTHROPLASTY;  Surgeon: Barnett Abu, MD;  Location: [facility];  Service: Neurosurgery;  Laterality: N/A;  C56 C67 artificial disc replacement   CESAREAN SECTION     COLONOSCOPY     DILATATION & CURETTAGE/HYSTEROSCOPY WITH TRUECLEAR N/A 11/25/2013   Procedure: DILATATION & CURETTAGE/HYSTEROSCOPY WITH TRUCLEAR;  Surgeon: Meriel Pica, MD;  Location: [facility];  Service: Gynecology;  Laterality: N/A;   ECTOPIC PREGNANCY SURGERY     GALLBLADDER SURGERY     MOUTH SURGERY     child- dog bite   SPINAL FUSION     2023   STERIOD INJECTION Left 09/09/2022   Procedure: LEFT KNEE STEROID INJECTION;  Surgeon:  Ollen Gross, MD;  Location: [facility];  Service: Orthopedics;  Laterality: Left;  left knee injection 0928   TONSILLECTOMY     TOTAL KNEE ARTHROPLASTY Right 09/09/2022   Procedure: TOTAL KNEE ARTHROPLASTY;  Surgeon: Ollen Gross, MD;  Location: [facility];  Service: Orthopedics;  Laterality: Right;   TUBAL LIGATION     Patient Active Problem List   Diagnosis Date Noted   OA (osteoarthritis) of knee 09/09/2022   Exertional dyspnea 04/17/2022   Elevated coronary artery calcium score 04/17/2022   Spondylolisthesis at L4-L5 level 12/27/2021   Ductal carcinoma in situ (DCIS) of left breast 01/19/2018   Generalized anxiety disorder 11/07/2017   Depression 11/07/2017   Insomnia 11/07/2017   Cervical spondylosis 03/19/2013    PCP: Creola Corn  REFERRING PROVIDER: Trudee Grip  REFERRING DIAG: R TKA 09/09/22  THERAPY DIAG:  S/P total knee arthroplasty, right  Acute pain of right knee  Stiffness of right knee, not elsewhere classified  Muscle weakness (generalized)  Rationale for Evaluation and Treatment: Rehabilitation  ONSET DATE: 09/09/22  SUBJECTIVE:   SUBJECTIVE STATEMENT:  Doing Ok, switched to a 4ww walker because the legs on the other one kept catching on the carpet. Back has been bothering me recently.    PERTINENT HISTORY: R  TKA 8/19 L knee steroid injection 09/09/22  PAIN:  Are you having pain? Yes: NPRS scale: 8.5/10 Pain location: R knee Pain description: sharp when I use it, if I am stationary it is dull and constant Aggravating factors: weight bearing on the RLE, bending, moving it  Relieving factors: ice helps some, pain meds some but I don't think it is enough  PRECAUTIONS: None  RED FLAGS: None   WEIGHT BEARING RESTRICTIONS: No  FALLS:  Has patient fallen in last 6 months? No  LIVING ENVIRONMENT: Lives with: lives with their family and lives with their spouse Lives in: House/apartment Stairs: Yes: External: 3 steps; none Has following equipment at  home: Single point cane and Walker - 2 wheeled  OCCUPATION: Not working  PLOF: Independent  PATIENT GOALS: No pain, to keep the knee moving  NEXT MD VISIT: 09/24/22  OBJECTIVE:   PATIENT SURVEYS:  FOTO 4  COGNITION: Overall cognitive status: Within functional limits for tasks assessed      EDEMA:  Increased swelling surrounding R knee  POSTURE: rounded shoulders and flexed trunk   PALPATION: Warm and TTP  LOWER EXTREMITY ROM:  Active ROM Right eval Left eval  Hip flexion    Hip extension    Hip abduction    Hip adduction    Hip internal rotation    Hip external rotation    Knee flexion  60   Knee extension -30   Ankle dorsiflexion    Ankle plantarflexion    Ankle inversion    Ankle eversion     (Blank rows = not tested)  LOWER EXTREMITY MMT:  MMT Right eval Left eval  Hip flexion 2   Hip extension    Hip abduction    Hip adduction    Hip internal rotation    Hip external rotation    Knee flexion 2   Knee extension 2   Ankle dorsiflexion    Ankle plantarflexion    Ankle inversion    Ankle eversion     (Blank rows = not tested)   FUNCTIONAL TESTS:  5 times sit to stand: unable to do  Timed up and go (TUG): 1 min 28s  GAIT: Distance walked: in clinic distances Assistive device utilized: Environmental consultant - 2 wheeled Level of assistance: SBA Comments: antalgic gait, slowed cadence, step to pattern, decreased step length and time on RLE    TODAY'S TREATMENT:                                                                                                                              DATE:   09/17/22  TherEx  Nustep with UEs L1 x8 minutes focus on knee ROM Quad sets x12 with 3 second holds  SAQs with bolster from PT 1x10  SLRs MinA from PT x10 Knee flexion PROM seated to tolerance x10  Education about WBAT, general recovery from TKR and exercise form, recommended use of towel bolster to cut down  on LE ER in supine when trying to sleep    EVAL-  09/12/22    PATIENT EDUCATION:  Education details: POC and HEP Person educated: Patient Education method: Explanation Education comprehension: verbalized understanding  HOME EXERCISE PROGRAM: Access Code: [email or web address] URL: [email or web address] Date: 09/12/2022 Prepared by: Cassie Freer  Exercises - Seated Heel Slide  - 1 x daily - 7 x weekly - 2 sets - 10 reps - Supine Quad Set  - 1 x daily - 7 x weekly - 2 sets - 10 reps - Supine Ankle Pumps  - 1 x daily - 7 x weekly - 2 sets - 10 reps  ASSESSMENT:  CLINICAL IMPRESSION:  Kimila arrives today doing OK, still having a lot of knee pain but generally as expected following TKR surgery. Worked on ROM and strength as able with good tolerance- she seemed a bit surprised at how much she was able to do with her leg after surgery! Very fatigued at EOS, will continue to progress as able and appropriate.   OBJECTIVE IMPAIRMENTS: Abnormal gait, difficulty walking, decreased ROM, decreased strength, and pain.   ACTIVITY LIMITATIONS: bending, sitting, standing, squatting, sleeping, stairs, transfers, bathing, toileting, and locomotion level  PARTICIPATION LIMITATIONS: meal prep, cleaning, laundry, driving, shopping, and community activity  REHAB POTENTIAL: Good  CLINICAL DECISION MAKING: Stable/uncomplicated  EVALUATION COMPLEXITY: Low   GOALS: Goals reviewed with patient? Yes  SHORT TERM GOALS: Target date: 10/24/22  Patient will be independent with initial HEP. Goal status: INITIAL  2.  Patient will be do TUG < 30s Baseline: 1 min 28s  Goal status: INITIAL  3.  Patient will be able to do 5 sit to stands  Baseline: unable to do  Goal status: INITIAL   LONG TERM GOALS: Target date: 12/05/22  Patient will be independent with advanced/ongoing HEP to improve outcomes and carryover.  Goal status: INITIAL  2.  Patient will report at least 75% improvement in R knee pain to improve QOL. Baseline: 10/10 Goal status:  INITIAL  3.  Patient will demonstrate improved R knee AROM to >/= 0-120 deg to allow for normal gait and stair mechanics. Baseline: 30-0-60 Goal status: INITIAL  4.  Patient will demonstrate improved functional LE strength as demonstrated by 5/5. Baseline: 2/5 Goal status: INITIAL  5.  Patient will be able to ambulate 500' with LRAD and normal gait pattern without increased pain to access community.  Baseline: using RW small in home ambulation distances Goal status: INITIAL  6.  Patient will report 63 on FOTO (patient reported outcome measure) to demonstrate improved functional ability. Baseline: 4 Goal status: INITIAL  PLAN:  PT FREQUENCY: 2x/week  PT DURATION: 12 weeks  PLANNED INTERVENTIONS: Therapeutic exercises, Therapeutic activity, Neuromuscular re-education, Balance training, Gait training, Patient/Family education, Self Care, Joint mobilization, Stair training, Electrical stimulation, Cryotherapy, Moist heat, scar mobilization, Vasopneumatic device, and Manual therapy  PLAN FOR NEXT SESSION: PROM, AAROM to R knee, vaso for pain and swelling, gait training/enforce WBAT   Nedra Hai, PT, DPT 09/17/22 5:05 PM

## 2022-09-19 ENCOUNTER — Encounter: Payer: Self-pay | Admitting: Physical Therapy

## 2022-09-19 ENCOUNTER — Ambulatory Visit: Payer: No Typology Code available for payment source | Admitting: Physical Therapy

## 2022-09-20 ENCOUNTER — Ambulatory Visit: Payer: No Typology Code available for payment source | Admitting: Physical Therapy

## 2022-09-20 DIAGNOSIS — Z96651 Presence of right artificial knee joint: Secondary | ICD-10-CM

## 2022-09-20 DIAGNOSIS — M6281 Muscle weakness (generalized): Secondary | ICD-10-CM

## 2022-09-20 DIAGNOSIS — M25661 Stiffness of right knee, not elsewhere classified: Secondary | ICD-10-CM

## 2022-09-20 DIAGNOSIS — M25561 Pain in right knee: Secondary | ICD-10-CM

## 2022-09-20 NOTE — Therapy (Signed)
OUTPATIENT PHYSICAL THERAPY LOWER EXTREMITY TREATMENT   Patient Name: [name] MRN: [id number] DOB:[birth year], 62 y.o., female Today's Date: 09/20/2022  END OF SESSION:  PT End of Session - 09/20/22 0842     Visit Number 3    Date for PT Re-Evaluation 12/05/22    Authorization Type [employer]    PT Start Time [phone]    PT Stop Time 0930    PT Time Calculation (min) 47 min              Past Medical History:  Diagnosis Date   Arthritis    Back pain of lumbar region with sciatica    Cancer (HCC)    breast   Depression    Diabetes mellitus, type II (HCC)    Family history of anesthesia complication    grandfather died under anesthesia 70's- had heart issues   H/O carpal tunnel repair 2014   Headache(784.0)    Hyperlipidemia    Hypertension    Hypothyroidism    Medial meniscus tear    PONV (postoperative nausea and vomiting)    Sleep apnea    cpap since 10   Past Surgical History:  Procedure Laterality Date   BILATERAL TOTAL MASTECTOMY WITH AXILLARY LYMPH NODE DISSECTION     BREAST RECONSTRUCTION     CERVICAL DISC ARTHROPLASTY N/A 03/19/2013   Procedure: CERVICAL FIVE TO SIX, CERVICAL SIX TO SEVEN CERVICAL ANTERIOR DISC ARTHROPLASTY;  Surgeon: Barnett Abu, MD;  Location: [facility];  Service: Neurosurgery;  Laterality: N/A;  C56 C67 artificial disc replacement   CESAREAN SECTION     COLONOSCOPY     DILATATION & CURETTAGE/HYSTEROSCOPY WITH TRUECLEAR N/A 11/25/2013   Procedure: DILATATION & CURETTAGE/HYSTEROSCOPY WITH TRUCLEAR;  Surgeon: Meriel Pica, MD;  Location: [facility];  Service: Gynecology;  Laterality: N/A;   ECTOPIC PREGNANCY SURGERY     GALLBLADDER SURGERY     MOUTH SURGERY     child- dog bite   SPINAL FUSION     2023   STERIOD INJECTION Left 09/09/2022   Procedure: LEFT KNEE STEROID INJECTION;  Surgeon: Ollen Gross, MD;  Location: [facility];  Service: Orthopedics;  Laterality: Left;  left knee injection 0928   TONSILLECTOMY     TOTAL KNEE  ARTHROPLASTY Right 09/09/2022   Procedure: TOTAL KNEE ARTHROPLASTY;  Surgeon: Ollen Gross, MD;  Location: [facility];  Service: Orthopedics;  Laterality: Right;   TUBAL LIGATION     Patient Active Problem List   Diagnosis Date Noted   OA (osteoarthritis) of knee 09/09/2022   Exertional dyspnea 04/17/2022   Elevated coronary artery calcium score 04/17/2022   Spondylolisthesis at L4-L5 level 12/27/2021   Ductal carcinoma in situ (DCIS) of left breast 01/19/2018   Generalized anxiety disorder 11/07/2017   Depression 11/07/2017   Insomnia 11/07/2017   Cervical spondylosis 03/19/2013    PCP: Creola Corn  REFERRING PROVIDER: Trudee Grip  REFERRING DIAG: R TKA 09/09/22  THERAPY DIAG:  S/P total knee arthroplasty, right  Acute pain of right knee  Stiffness of right knee, not elsewhere classified  Muscle weakness (generalized)  Rationale for Evaluation and Treatment: Rehabilitation  ONSET DATE: 09/09/22  SUBJECTIVE:   SUBJECTIVE STATEMENT:  Doing better, will be 2 weeks Monday. Seeing MD Tuesday next week PERTINENT HISTORY: R TKA 8/19 L knee steroid injection 09/09/22  PAIN:  Are you having pain? Yes: NPRS scale: 5.5/10 Pain location: R knee Pain description: sharp when I use it, if I am stationary it is dull and constant  Aggravating factors: weight bearing on the RLE, bending, moving it  Relieving factors: ice helps some, pain meds some but I don't think it is enough  PRECAUTIONS: None  RED FLAGS: None   WEIGHT BEARING RESTRICTIONS: No  FALLS:  Has patient fallen in last 6 months? No  LIVING ENVIRONMENT: Lives with: lives with their family and lives with their spouse Lives in: House/apartment Stairs: Yes: External: 3 steps; none Has following equipment at home: Single point cane and Walker - 2 wheeled  OCCUPATION: Not working  PLOF: Independent  PATIENT GOALS: No pain, to keep the knee moving  NEXT MD VISIT: 09/24/22  OBJECTIVE:   PATIENT SURVEYS:  FOTO  4  COGNITION: Overall cognitive status: Within functional limits for tasks assessed      EDEMA:  Increased swelling surrounding R knee  POSTURE: rounded shoulders and flexed trunk   PALPATION: Warm and TTP  LOWER EXTREMITY ROM:  Active ROM Right eval Left eval  Hip flexion    Hip extension    Hip abduction    Hip adduction    Hip internal rotation    Hip external rotation    Knee flexion  60   Knee extension -30   Ankle dorsiflexion    Ankle plantarflexion    Ankle inversion    Ankle eversion     (Blank rows = not tested)  LOWER EXTREMITY MMT:  MMT Right eval Left eval  Hip flexion 2   Hip extension    Hip abduction    Hip adduction    Hip internal rotation    Hip external rotation    Knee flexion 2   Knee extension 2   Ankle dorsiflexion    Ankle plantarflexion    Ankle inversion    Ankle eversion     (Blank rows = not tested)   FUNCTIONAL TESTS:  5 times sit to stand: unable to do  Timed up and go (TUG): 1 min 28s  GAIT: Distance walked: in clinic distances Assistive device utilized: Environmental consultant - 2 wheeled Level of assistance: SBA Comments: antalgic gait, slowed cadence, step to pattern, decreased step length and time on RLE    TODAY'S TREATMENT:                                                                                                                              DATE:   09/20/22  Nustep L 4 6 min PROM flex and ext with end range stretching AROM seated  7-  85 PROM seated 5- 90 PERFORMED AND ISSUED HEP Step tap 4 inch 2 sets 10- assistance to prevent hip hiking Standing with RW hip flex marching and HS curl 2 sets 10 After ex act flexion 92      09/17/22  TherEx  Nustep with UEs L1 x8 minutes focus on knee ROM Quad sets x12 with 3 second holds  SAQs with bolster from PT 1x10  SLRs MinA from PT x10 Knee  flexion PROM seated to tolerance x10  Education about WBAT, general recovery from TKR and exercise form, recommended use of  towel bolster to cut down on LE ER in supine when trying to sleep    EVAL- 09/12/22    PATIENT EDUCATION:  Education details: POC and HEP Person educated: Patient Education method: Explanation Education comprehension: verbalized understanding  HOME EXERCISE PROGRAM: Access Code: [email or web address]  ASSESSMENT:  CLINICAL IMPRESSION: progressed ex, ROM and func. Issued HEP. Checked ROM    OBJECTIVE IMPAIRMENTS: Abnormal gait, difficulty walking, decreased ROM, decreased strength, and pain.   ACTIVITY LIMITATIONS: bending, sitting, standing, squatting, sleeping, stairs, transfers, bathing, toileting, and locomotion level  PARTICIPATION LIMITATIONS: meal prep, cleaning, laundry, driving, shopping, and community activity  REHAB POTENTIAL: Good  CLINICAL DECISION MAKING: Stable/uncomplicated  EVALUATION COMPLEXITY: Low   GOALS: Goals reviewed with patient? Yes  SHORT TERM GOALS: Target date: 10/24/22  Patient will be independent with initial HEP. Goal status: 09/20/22 met  2.  Patient will be do TUG < 30s Baseline: 1 min 28s  Goal status: INITIAL  3.  Patient will be able to do 5 sit to stands  Baseline: unable to do  Goal status: INITIAL   LONG TERM GOALS: Target date: 12/05/22  Patient will be independent with advanced/ongoing HEP to improve outcomes and carryover.  Goal status: INITIAL  2.  Patient will report at least 75% improvement in R knee pain to improve QOL. Baseline: 10/10 Goal status: INITIAL  3.  Patient will demonstrate improved R knee AROM to >/= 0-120 deg to allow for normal gait and stair mechanics. Baseline: 30-0-60 Goal status: INITIAL  4.  Patient will demonstrate improved functional LE strength as demonstrated by 5/5. Baseline: 2/5 Goal status: INITIAL  5.  Patient will be able to ambulate 500' with LRAD and normal gait pattern without increased pain to access community.  Baseline: using RW small in home ambulation distances Goal status:  INITIAL  6.  Patient will report 75 on FOTO (patient reported outcome measure) to demonstrate improved functional ability. Baseline: 4 Goal status: INITIAL  PLAN:  PT FREQUENCY: 2x/week  PT DURATION: 12 weeks  PLANNED INTERVENTIONS: Therapeutic exercises, Therapeutic activity, Neuromuscular re-education, Balance training, Gait training, Patient/Family education, Self Care, Joint mobilization, Stair training, Electrical stimulation, Cryotherapy, Moist heat, scar mobilization, Vasopneumatic device, and Manual therapy  PLAN FOR NEXT SESSION: PROM, AAROM to R knee, vaso for pain and swelling, gait training/enforce WBAT   Encounter Date: 09/20/2022   Nicola Girt 09/20/2022, 8:46 AM  [facility] [address] [place], [place], [address] Phone: [phone]   Fax:  [phone]

## 2022-09-24 ENCOUNTER — Ambulatory Visit: Payer: No Typology Code available for payment source | Attending: Orthopedic Surgery | Admitting: Physical Therapy

## 2022-09-24 ENCOUNTER — Encounter: Payer: Self-pay | Admitting: Physical Therapy

## 2022-09-24 DIAGNOSIS — M6281 Muscle weakness (generalized): Secondary | ICD-10-CM

## 2022-09-24 DIAGNOSIS — R2689 Other abnormalities of gait and mobility: Secondary | ICD-10-CM | POA: Diagnosis present

## 2022-09-24 DIAGNOSIS — M25661 Stiffness of right knee, not elsewhere classified: Secondary | ICD-10-CM

## 2022-09-24 DIAGNOSIS — R600 Localized edema: Secondary | ICD-10-CM | POA: Diagnosis present

## 2022-09-24 DIAGNOSIS — M25561 Pain in right knee: Secondary | ICD-10-CM | POA: Diagnosis present

## 2022-09-24 DIAGNOSIS — Z96651 Presence of right artificial knee joint: Secondary | ICD-10-CM | POA: Diagnosis present

## 2022-09-24 NOTE — Therapy (Signed)
OUTPATIENT PHYSICAL THERAPY LOWER EXTREMITY TREATMENT   Patient Name: [name] MRN: [id number] DOB:1960-05-13, 62 y.o., female Today's Date: 09/24/2022  END OF SESSION:  PT End of Session - 09/24/22 1604     Visit Number 4    Date for PT Re-Evaluation 12/05/22    Authorization Type [employer]    PT Start Time 1558    PT Stop Time 1641    PT Time Calculation (min) 43 min    Activity Tolerance Patient limited by pain;Patient tolerated treatment well    Behavior During Therapy [facility] for tasks assessed/performed               Past Medical History:  Diagnosis Date   Arthritis    Back pain of lumbar region with sciatica    Cancer (HCC)    breast   Depression    Diabetes mellitus, type II (HCC)    Family history of anesthesia complication    grandfather died under anesthesia 70's- had heart issues   H/O carpal tunnel repair 2014   Headache(784.0)    Hyperlipidemia    Hypertension    Hypothyroidism    Medial meniscus tear    PONV (postoperative nausea and vomiting)    Sleep apnea    cpap since 10   Past Surgical History:  Procedure Laterality Date   BILATERAL TOTAL MASTECTOMY WITH AXILLARY LYMPH NODE DISSECTION     BREAST RECONSTRUCTION     CERVICAL DISC ARTHROPLASTY N/A 03/19/2013   Procedure: CERVICAL FIVE TO SIX, CERVICAL SIX TO SEVEN CERVICAL ANTERIOR DISC ARTHROPLASTY;  Surgeon: Barnett Abu, MD;  Location: [facility];  Service: Neurosurgery;  Laterality: N/A;  C56 C67 artificial disc replacement   CESAREAN SECTION     COLONOSCOPY     DILATATION & CURETTAGE/HYSTEROSCOPY WITH TRUECLEAR N/A 11/25/2013   Procedure: DILATATION & CURETTAGE/HYSTEROSCOPY WITH TRUCLEAR;  Surgeon: Meriel Pica, MD;  Location: [facility];  Service: Gynecology;  Laterality: N/A;   ECTOPIC PREGNANCY SURGERY     GALLBLADDER SURGERY     MOUTH SURGERY     child- dog bite   SPINAL FUSION     2023   STERIOD INJECTION Left 09/09/2022   Procedure: LEFT KNEE STEROID INJECTION;  Surgeon:  Ollen Gross, MD;  Location: [facility];  Service: Orthopedics;  Laterality: Left;  left knee injection 0928   TONSILLECTOMY     TOTAL KNEE ARTHROPLASTY Right 09/09/2022   Procedure: TOTAL KNEE ARTHROPLASTY;  Surgeon: Ollen Gross, MD;  Location: [facility];  Service: Orthopedics;  Laterality: Right;   TUBAL LIGATION     Patient Active Problem List   Diagnosis Date Noted   OA (osteoarthritis) of knee 09/09/2022   Exertional dyspnea 04/17/2022   Elevated coronary artery calcium score 04/17/2022   Spondylolisthesis at L4-L5 level 12/27/2021   Ductal carcinoma in situ (DCIS) of left breast 01/19/2018   Generalized anxiety disorder 11/07/2017   Depression 11/07/2017   Insomnia 11/07/2017   Cervical spondylosis 03/19/2013    PCP: Creola Corn  REFERRING PROVIDER: Trudee Grip  REFERRING DIAG: R TKA 09/09/22  THERAPY DIAG:  S/P total knee arthroplasty, right  Acute pain of right knee  Stiffness of right knee, not elsewhere classified  Muscle weakness (generalized)  Rationale for Evaluation and Treatment: Rehabilitation  ONSET DATE: 09/09/22  SUBJECTIVE:   SUBJECTIVE STATEMENT:  Doing OK, getting better every day. MD was happy with my progress, saw him this morning    PERTINENT HISTORY: R TKA 8/19 L knee steroid injection 09/09/22  PAIN:  Are you having pain? Yes: NPRS scale: 7.5/10 Pain location: R knee Pain description: sharp when I use it, if I am stationary it is dull and constant Aggravating factors: weight bearing on the RLE, bending, moving it  Relieving factors: ice helps some, pain meds some but I don't think it is enough  PRECAUTIONS: None  RED FLAGS: None   WEIGHT BEARING RESTRICTIONS: No  FALLS:  Has patient fallen in last 6 months? No  LIVING ENVIRONMENT: Lives with: lives with their family and lives with their spouse Lives in: House/apartment Stairs: Yes: External: 3 steps; none Has following equipment at home: Single point cane and Walker - 2  wheeled  OCCUPATION: Not working  PLOF: Independent  PATIENT GOALS: No pain, to keep the knee moving  NEXT MD VISIT: 09/24/22  OBJECTIVE:   PATIENT SURVEYS:  FOTO 4  COGNITION: Overall cognitive status: Within functional limits for tasks assessed      EDEMA:  Increased swelling surrounding R knee  POSTURE: rounded shoulders and flexed trunk   PALPATION: Warm and TTP  LOWER EXTREMITY ROM:  Active ROM Right eval Left eval Right 09/24/22  Hip flexion     Hip extension     Hip abduction     Hip adduction     Hip internal rotation     Hip external rotation     Knee flexion  60  75* AROM, 86 PROM  Knee extension -30  -15* AROM seated, -15 PROM supine   Ankle dorsiflexion     Ankle plantarflexion     Ankle inversion     Ankle eversion      (Blank rows = not tested)  LOWER EXTREMITY MMT:  MMT Right eval Left eval  Hip flexion 2   Hip extension    Hip abduction    Hip adduction    Hip internal rotation    Hip external rotation    Knee flexion 2   Knee extension 2   Ankle dorsiflexion    Ankle plantarflexion    Ankle inversion    Ankle eversion     (Blank rows = not tested)   FUNCTIONAL TESTS:  5 times sit to stand: unable to do  Timed up and go (TUG): 1 min 28s  GAIT: Distance walked: in clinic distances Assistive device utilized: Environmental consultant - 2 wheeled Level of assistance: SBA Comments: antalgic gait, slowed cadence, step to pattern, decreased step length and time on RLE    TODAY'S TREATMENT:                                                                                                                              DATE:   09/24/22  Nustep x8 minutes L1 with UEs focus on ROM LAQ 2# x10 with 2 second holds  Knee overpressure extension stretch 2# x4 minutes  SLR + quad set x12 0# Forward step ups 4 inch step BUE support HS curls R LE  x10 0#  Manual  Knee flexion OP seated to tolerance      09/20/22  Nustep L 4 6 min PROM flex and ext with  end range stretching AROM seated  7-  85 PROM seated 5- 90 PERFORMED AND ISSUED HEP Step tap 4 inch 2 sets 10- assistance to prevent hip hiking Standing with RW hip flex marching and HS curl 2 sets 10 After ex act flexion 92      09/17/22  TherEx  Nustep with UEs L1 x8 minutes focus on knee ROM Quad sets x12 with 3 second holds  SAQs with bolster from PT 1x10  SLRs MinA from PT x10 Knee flexion PROM seated to tolerance x10  Education about WBAT, general recovery from TKR and exercise form, recommended use of towel bolster to cut down on LE ER in supine when trying to sleep    EVAL- 09/12/22    PATIENT EDUCATION:  Education details: POC and HEP Person educated: Patient Education method: Explanation Education comprehension: verbalized understanding  HOME EXERCISE PROGRAM: Access Code: [email or web address]  ASSESSMENT:  CLINICAL IMPRESSION:   Tamaki arrives doing OK, seems to be getting a bit better and better every day. Continued focus on ROM and strength as appropriate. Coming along well.    OBJECTIVE IMPAIRMENTS: Abnormal gait, difficulty walking, decreased ROM, decreased strength, and pain.   ACTIVITY LIMITATIONS: bending, sitting, standing, squatting, sleeping, stairs, transfers, bathing, toileting, and locomotion level  PARTICIPATION LIMITATIONS: meal prep, cleaning, laundry, driving, shopping, and community activity  REHAB POTENTIAL: Good  CLINICAL DECISION MAKING: Stable/uncomplicated  EVALUATION COMPLEXITY: Low   GOALS: Goals reviewed with patient? Yes  SHORT TERM GOALS: Target date: 10/24/22  Patient will be independent with initial HEP. Goal status: 09/20/22 met  2.  Patient will be do TUG < 30s Baseline: 1 min 28s  Goal status: INITIAL  3.  Patient will be able to do 5 sit to stands  Baseline: unable to do  Goal status: INITIAL   LONG TERM GOALS: Target date: 12/05/22  Patient will be independent with advanced/ongoing HEP to improve outcomes and  carryover.  Goal status: INITIAL  2.  Patient will report at least 75% improvement in R knee pain to improve QOL. Baseline: 10/10 Goal status: INITIAL  3.  Patient will demonstrate improved R knee AROM to >/= 0-120 deg to allow for normal gait and stair mechanics. Baseline: 30-0-60 Goal status: INITIAL  4.  Patient will demonstrate improved functional LE strength as demonstrated by 5/5. Baseline: 2/5 Goal status: INITIAL  5.  Patient will be able to ambulate 500' with LRAD and normal gait pattern without increased pain to access community.  Baseline: using RW small in home ambulation distances Goal status: INITIAL  6.  Patient will report 59 on FOTO (patient reported outcome measure) to demonstrate improved functional ability. Baseline: 4 Goal status: INITIAL  PLAN:  PT FREQUENCY: 2x/week  PT DURATION: 12 weeks  PLANNED INTERVENTIONS: Therapeutic exercises, Therapeutic activity, Neuromuscular re-education, Balance training, Gait training, Patient/Family education, Self Care, Joint mobilization, Stair training, Electrical stimulation, Cryotherapy, Moist heat, scar mobilization, Vasopneumatic device, and Manual therapy  PLAN FOR NEXT SESSION: PROM, AAROM to R knee, vaso for pain and swelling, gait training/enforce WBAT   Nedra Hai, PT, DPT 09/24/22 4:43 PM    [facility] [address] [facility] [address] [place], [place], [address] Phone: [phone]   Fax:  [phone]

## 2022-09-26 ENCOUNTER — Encounter: Payer: Self-pay | Admitting: Physical Therapy

## 2022-09-26 ENCOUNTER — Ambulatory Visit: Payer: No Typology Code available for payment source | Admitting: Physical Therapy

## 2022-09-26 DIAGNOSIS — M25661 Stiffness of right knee, not elsewhere classified: Secondary | ICD-10-CM

## 2022-09-26 DIAGNOSIS — M6281 Muscle weakness (generalized): Secondary | ICD-10-CM

## 2022-09-26 DIAGNOSIS — Z96651 Presence of right artificial knee joint: Secondary | ICD-10-CM | POA: Diagnosis not present

## 2022-09-26 DIAGNOSIS — R2689 Other abnormalities of gait and mobility: Secondary | ICD-10-CM

## 2022-09-26 DIAGNOSIS — M25561 Pain in right knee: Secondary | ICD-10-CM

## 2022-09-26 NOTE — Therapy (Signed)
OUTPATIENT PHYSICAL THERAPY LOWER EXTREMITY TREATMENT   Patient Name: [name] MRN: [id number] DOB:[birth year], 62 y.o., female Today's Date: 09/26/2022  END OF SESSION:  PT End of Session - 09/26/22 1632     Visit Number 5    Date for PT Re-Evaluation 12/05/22    Authorization Type U[employer]    PT Start Time 1600    PT Stop Time 1640    PT Time Calculation (min) 40 min    Activity Tolerance Patient limited by pain;Patient tolerated treatment well    Behavior During Therapy [facility] for tasks assessed/performed                Past Medical History:  Diagnosis Date   Arthritis    Back pain of lumbar region with sciatica    Cancer (HCC)    breast   Depression    Diabetes mellitus, type II (HCC)    Family history of anesthesia complication    grandfather died under anesthesia 70's- had heart issues   H/O carpal tunnel repair 2014   Headache(784.0)    Hyperlipidemia    Hypertension    Hypothyroidism    Medial meniscus tear    PONV (postoperative nausea and vomiting)    Sleep apnea    cpap since 10   Past Surgical History:  Procedure Laterality Date   BILATERAL TOTAL MASTECTOMY WITH AXILLARY LYMPH NODE DISSECTION     BREAST RECONSTRUCTION     CERVICAL DISC ARTHROPLASTY N/A 03/19/2013   Procedure: CERVICAL FIVE TO SIX, CERVICAL SIX TO SEVEN CERVICAL ANTERIOR DISC ARTHROPLASTY;  Surgeon: Barnett Abu, MD;  Location: [facility];  Service: Neurosurgery;  Laterality: N/A;  C56 C67 artificial disc replacement   CESAREAN SECTION     COLONOSCOPY     DILATATION & CURETTAGE/HYSTEROSCOPY WITH TRUECLEAR N/A 11/25/2013   Procedure: DILATATION & CURETTAGE/HYSTEROSCOPY WITH TRUCLEAR;  Surgeon: Meriel Pica, MD;  Location: [facility];  Service: Gynecology;  Laterality: N/A;   ECTOPIC PREGNANCY SURGERY     GALLBLADDER SURGERY     MOUTH SURGERY     child- dog bite   SPINAL FUSION     2023   STERIOD INJECTION Left 09/09/2022   Procedure: LEFT KNEE STEROID INJECTION;  Surgeon:  Ollen Gross, MD;  Location: [facility];  Service: Orthopedics;  Laterality: Left;  left knee injection 0928   TONSILLECTOMY     TOTAL KNEE ARTHROPLASTY Right 09/09/2022   Procedure: TOTAL KNEE ARTHROPLASTY;  Surgeon: Ollen Gross, MD;  Location: [facility];  Service: Orthopedics;  Laterality: Right;   TUBAL LIGATION     Patient Active Problem List   Diagnosis Date Noted   OA (osteoarthritis) of knee 09/09/2022   Exertional dyspnea 04/17/2022   Elevated coronary artery calcium score 04/17/2022   Spondylolisthesis at L4-L5 level 12/27/2021   Ductal carcinoma in situ (DCIS) of left breast 01/19/2018   Generalized anxiety disorder 11/07/2017   Depression 11/07/2017   Insomnia 11/07/2017   Cervical spondylosis 03/19/2013    PCP: Creola Corn  REFERRING PROVIDER: Trudee Grip  REFERRING DIAG: R TKA 09/09/22  THERAPY DIAG:  S/P total knee arthroplasty, right  Acute pain of right knee  Stiffness of right knee, not elsewhere classified  Muscle weakness (generalized)  Other abnormalities of gait and mobility  Rationale for Evaluation and Treatment: Rehabilitation  ONSET DATE: 09/09/22  SUBJECTIVE:   SUBJECTIVE STATEMENT:  I felt pretty tired and had a lot of pain after last time, I think I did too much like you told  me about. Took yesterday easy, feeling better today. Have been icing a lot.   PERTINENT HISTORY: R TKA 8/19 L knee steroid injection 09/09/22  PAIN:  Are you having pain? Yes: NPRS scale: 5.5/10 Pain location: R knee Pain description: sharp when I use it, if I am stationary it is dull and constant Aggravating factors: weight bearing on the RLE, bending, moving it  Relieving factors: ice helps some, pain meds some but I don't think it is enough  PRECAUTIONS: None  RED FLAGS: None   WEIGHT BEARING RESTRICTIONS: No  FALLS:  Has patient fallen in last 6 months? No  LIVING ENVIRONMENT: Lives with: lives with their family and lives with their spouse Lives in:  House/apartment Stairs: Yes: External: 3 steps; none Has following equipment at home: Single point cane and Walker - 2 wheeled  OCCUPATION: Not working  PLOF: Independent  PATIENT GOALS: No pain, to keep the knee moving  NEXT MD VISIT: 09/24/22  OBJECTIVE:   PATIENT SURVEYS:  FOTO 4  COGNITION: Overall cognitive status: Within functional limits for tasks assessed      EDEMA:  Increased swelling surrounding R knee  POSTURE: rounded shoulders and flexed trunk   PALPATION: Warm and TTP  LOWER EXTREMITY ROM:  Active ROM Right eval Left eval Right 09/24/22 Right 09/26/22  Hip flexion      Hip extension      Hip abduction      Hip adduction      Hip internal rotation      Hip external rotation      Knee flexion  60  75* AROM, 86 PROM 82* AROM seated per pt request   Knee extension -30  -15* AROM seated, -15 PROM supine    Ankle dorsiflexion      Ankle plantarflexion      Ankle inversion      Ankle eversion       (Blank rows = not tested)  LOWER EXTREMITY MMT:  MMT Right eval Left eval  Hip flexion 2   Hip extension    Hip abduction    Hip adduction    Hip internal rotation    Hip external rotation    Knee flexion 2   Knee extension 2   Ankle dorsiflexion    Ankle plantarflexion    Ankle inversion    Ankle eversion     (Blank rows = not tested)   FUNCTIONAL TESTS:  5 times sit to stand: unable to do  Timed up and go (TUG): 1 min 28s  GAIT: Distance walked: in clinic distances Assistive device utilized: Environmental consultant - 2 wheeled Level of assistance: SBA Comments: antalgic gait, slowed cadence, step to pattern, decreased step length and time on RLE    TODAY'S TREATMENT:                                                                                                                              DATE:  09/26/22  TherEx  SciFit bike seat 8 x8 minutes partial rotations for ROM SLRs + quad set x15 R LE  SLRs + ER with quad set x10 R LE  HS 3x30 seconds R  LE  Bridges 3x5 with progressive knee flexion each round Seated knee self flexion AAROM from high mat table x12 with 5 second holds Attempted standing HS stretches on steps unable to tolerate due to medial R knee pain Forward step ups 4 inch step x12 leading with R LE  Gait training with focus on heel-toe pattern and equal step lengths/stance times   Manual  Knee extension overpressure x10 supine     09/24/22  Nustep x8 minutes L1 with UEs focus on ROM LAQ 2# x10 with 2 second holds  Knee overpressure extension stretch 2# x4 minutes  SLR + quad set x12 0# Forward step ups 4 inch step BUE support HS curls R LE x10 0#  Manual  Knee flexion OP seated to tolerance      09/20/22  Nustep L 4 6 min PROM flex and ext with end range stretching AROM seated  7-  85 PROM seated 5- 90 PERFORMED AND ISSUED HEP Step tap 4 inch 2 sets 10- assistance to prevent hip hiking Standing with RW hip flex marching and HS curl 2 sets 10 After ex act flexion 92      09/17/22  TherEx  Nustep with UEs L1 x8 minutes focus on knee ROM Quad sets x12 with 3 second holds  SAQs with bolster from PT 1x10  SLRs MinA from PT x10 Knee flexion PROM seated to tolerance x10  Education about WBAT, general recovery from TKR and exercise form, recommended use of towel bolster to cut down on LE ER in supine when trying to sleep    EVAL- 09/12/22    PATIENT EDUCATION:  Education details: POC and HEP Person educated: Patient Education method: Explanation Education comprehension: verbalized understanding  HOME EXERCISE PROGRAM: Access Code: [email or web address]  ASSESSMENT:  CLINICAL IMPRESSION:    Sumita arrives today doing well, did have extra knee edema and soreness after over-doing activities the day of last visit. Encouraged doing exercises throughout the day instead of all in one large go. Continued working on ROM and strength as appropriate, with progressions as able. Doing well and remains  motivated to improve.   OBJECTIVE IMPAIRMENTS: Abnormal gait, difficulty walking, decreased ROM, decreased strength, and pain.   ACTIVITY LIMITATIONS: bending, sitting, standing, squatting, sleeping, stairs, transfers, bathing, toileting, and locomotion level  PARTICIPATION LIMITATIONS: meal prep, cleaning, laundry, driving, shopping, and community activity  REHAB POTENTIAL: Good  CLINICAL DECISION MAKING: Stable/uncomplicated  EVALUATION COMPLEXITY: Low   GOALS: Goals reviewed with patient? Yes  SHORT TERM GOALS: Target date: 10/24/22  Patient will be independent with initial HEP. Goal status: 09/20/22 met  2.  Patient will be do TUG < 30s Baseline: 1 min 28s  Goal status: INITIAL  3.  Patient will be able to do 5 sit to stands  Baseline: unable to do  Goal status: INITIAL   LONG TERM GOALS: Target date: 12/05/22  Patient will be independent with advanced/ongoing HEP to improve outcomes and carryover.  Goal status: INITIAL  2.  Patient will report at least 75% improvement in R knee pain to improve QOL. Baseline: 10/10 Goal status: INITIAL  3.  Patient will demonstrate improved R knee AROM to >/= 0-120 deg to allow for normal gait and stair mechanics. Baseline: 30-0-60 Goal status: INITIAL  4.  Patient  will demonstrate improved functional LE strength as demonstrated by 5/5. Baseline: 2/5 Goal status: INITIAL  5.  Patient will be able to ambulate 500' with LRAD and normal gait pattern without increased pain to access community.  Baseline: using RW small in home ambulation distances Goal status: INITIAL  6.  Patient will report 59 on FOTO (patient reported outcome measure) to demonstrate improved functional ability. Baseline: 4 Goal status: INITIAL  PLAN:  PT FREQUENCY: 2x/week  PT DURATION: 12 weeks  PLANNED INTERVENTIONS: Therapeutic exercises, Therapeutic activity, Neuromuscular re-education, Balance training, Gait training, Patient/Family education, Self  Care, Joint mobilization, Stair training, Electrical stimulation, Cryotherapy, Moist heat, scar mobilization, Vasopneumatic device, and Manual therapy  PLAN FOR NEXT SESSION: PROM, AAROM to R knee, vaso for pain and swelling, gait training/enforce WBAT, manual PRN  Nedra Hai, PT, DPT 09/26/22 4:45 PM    [facility] at [facility] [address] [facility]. [place], [place], [address] Phone: [phone]   Fax:  [phone]

## 2022-09-30 ENCOUNTER — Encounter: Payer: Self-pay | Admitting: Physical Therapy

## 2022-09-30 ENCOUNTER — Ambulatory Visit: Payer: No Typology Code available for payment source | Admitting: Physical Therapy

## 2022-09-30 ENCOUNTER — Telehealth: Payer: Self-pay | Admitting: Psychiatry

## 2022-09-30 DIAGNOSIS — M25661 Stiffness of right knee, not elsewhere classified: Secondary | ICD-10-CM

## 2022-09-30 DIAGNOSIS — M25561 Pain in right knee: Secondary | ICD-10-CM

## 2022-09-30 DIAGNOSIS — Z96651 Presence of right artificial knee joint: Secondary | ICD-10-CM | POA: Diagnosis not present

## 2022-09-30 DIAGNOSIS — M6281 Muscle weakness (generalized): Secondary | ICD-10-CM

## 2022-09-30 NOTE — Telephone Encounter (Signed)
Next visit is 10/24/22. Bethann states that her below prescriptions were not called in. She went to her pharmacy and they had nothing in computer.   Refills needed are:  Alprazolam, Doxazosin 4 mg, Vilazadone 20 mg, and Gabapentin both 300 and 100 mg. Please send these to:  [id number] [address] [address], [place] - [address]   Phone: [phone]  Fax: [phone]

## 2022-09-30 NOTE — Therapy (Signed)
OUTPATIENT PHYSICAL THERAPY LOWER EXTREMITY TREATMENT   Patient Name: [name] MRN: [id number] DOB:1960-02-03, 62 y.o., female Today's Date: 09/30/2022  END OF SESSION:  PT End of Session - 09/30/22 1608     Visit Number 6    Date for PT Re-Evaluation 12/05/22    Authorization Type U[employer]    PT Start Time 1600    PT Stop Time 1640    PT Time Calculation (min) 40 min    Activity Tolerance Patient limited by pain;Patient tolerated treatment well    Behavior During Therapy [facility] for tasks assessed/performed                 Past Medical History:  Diagnosis Date   Arthritis    Back pain of lumbar region with sciatica    Cancer (HCC)    breast   Depression    Diabetes mellitus, type II (HCC)    Family history of anesthesia complication    grandfather died under anesthesia 70's- had heart issues   H/O carpal tunnel repair 2014   Headache(784.0)    Hyperlipidemia    Hypertension    Hypothyroidism    Medial meniscus tear    PONV (postoperative nausea and vomiting)    Sleep apnea    cpap since 10   Past Surgical History:  Procedure Laterality Date   BILATERAL TOTAL MASTECTOMY WITH AXILLARY LYMPH NODE DISSECTION     BREAST RECONSTRUCTION     CERVICAL DISC ARTHROPLASTY N/A 03/19/2013   Procedure: CERVICAL FIVE TO SIX, CERVICAL SIX TO SEVEN CERVICAL ANTERIOR DISC ARTHROPLASTY;  Surgeon: Barnett Abu, MD;  Location: [facility];  Service: Neurosurgery;  Laterality: N/A;  C56 C67 artificial disc replacement   CESAREAN SECTION     COLONOSCOPY     DILATATION & CURETTAGE/HYSTEROSCOPY WITH TRUECLEAR N/A 11/25/2013   Procedure: DILATATION & CURETTAGE/HYSTEROSCOPY WITH TRUCLEAR;  Surgeon: Meriel Pica, MD;  Location: [facility];  Service: Gynecology;  Laterality: N/A;   ECTOPIC PREGNANCY SURGERY     GALLBLADDER SURGERY     MOUTH SURGERY     child- dog bite   SPINAL FUSION     2023   STERIOD INJECTION Left 09/09/2022   Procedure: LEFT KNEE STEROID INJECTION;  Surgeon:  Ollen Gross, MD;  Location: [facility];  Service: Orthopedics;  Laterality: Left;  left knee injection 0928   TONSILLECTOMY     TOTAL KNEE ARTHROPLASTY Right 09/09/2022   Procedure: TOTAL KNEE ARTHROPLASTY;  Surgeon: Ollen Gross, MD;  Location: [facility];  Service: Orthopedics;  Laterality: Right;   TUBAL LIGATION     Patient Active Problem List   Diagnosis Date Noted   OA (osteoarthritis) of knee 09/09/2022   Exertional dyspnea 04/17/2022   Elevated coronary artery calcium score 04/17/2022   Spondylolisthesis at L4-L5 level 12/27/2021   Ductal carcinoma in situ (DCIS) of left breast 01/19/2018   Generalized anxiety disorder 11/07/2017   Depression 11/07/2017   Insomnia 11/07/2017   Cervical spondylosis 03/19/2013    PCP: Creola Corn  REFERRING PROVIDER: Trudee Grip  REFERRING DIAG: R TKA 09/09/22  THERAPY DIAG:  S/P total knee arthroplasty, right  Acute pain of right knee  Stiffness of right knee, not elsewhere classified  Muscle weakness (generalized)  Rationale for Evaluation and Treatment: Rehabilitation  ONSET DATE: 09/09/22  SUBJECTIVE:   SUBJECTIVE STATEMENT:  I've been trying to go longer without my pain medicine, I wasn't sure how long I was supposed to be on it. Its a lot stiffer today. I  felt good after last session.   PERTINENT HISTORY: R TKA 8/19 L knee steroid injection 09/09/22  PAIN:  Are you having pain? Yes: NPRS scale: 7/10 Pain location: R knee Pain description: sharp medial R knee  Aggravating factors: weight bearing on the RLE, bending, moving it  Relieving factors: ice helps some, pain meds some but I don't think it is enough  PRECAUTIONS: None  RED FLAGS: None   WEIGHT BEARING RESTRICTIONS: No  FALLS:  Has patient fallen in last 6 months? No  LIVING ENVIRONMENT: Lives with: lives with their family and lives with their spouse Lives in: House/apartment Stairs: Yes: External: 3 steps; none Has following equipment at home: Single  point cane and Walker - 2 wheeled  OCCUPATION: Not working  PLOF: Independent  PATIENT GOALS: No pain, to keep the knee moving  NEXT MD VISIT: 09/24/22  OBJECTIVE:   PATIENT SURVEYS:  FOTO 4  COGNITION: Overall cognitive status: Within functional limits for tasks assessed      EDEMA:  Increased swelling surrounding R knee  POSTURE: rounded shoulders and flexed trunk   PALPATION: Warm and TTP  LOWER EXTREMITY ROM:  Active ROM Right eval Left eval Right 09/24/22 Right 09/26/22  Hip flexion      Hip extension      Hip abduction      Hip adduction      Hip internal rotation      Hip external rotation      Knee flexion  60  75* AROM, 86 PROM 82* AROM seated per pt request   Knee extension -30  -15* AROM seated, -15 PROM supine    Ankle dorsiflexion      Ankle plantarflexion      Ankle inversion      Ankle eversion       (Blank rows = not tested)  LOWER EXTREMITY MMT:  MMT Right eval Left eval  Hip flexion 2   Hip extension    Hip abduction    Hip adduction    Hip internal rotation    Hip external rotation    Knee flexion 2   Knee extension 2   Ankle dorsiflexion    Ankle plantarflexion    Ankle inversion    Ankle eversion     (Blank rows = not tested)   FUNCTIONAL TESTS:  5 times sit to stand: unable to do  Timed up and go (TUG): 1 min 28s  GAIT: Distance walked: in clinic distances Assistive device utilized: Environmental consultant - 2 wheeled Level of assistance: SBA Comments: antalgic gait, slowed cadence, step to pattern, decreased step length and time on RLE    TODAY'S TREATMENT:                                                                                                                              DATE:    09/30/22  TherEx  Reviewed good transfer mechanics/hand and foot placement and sequencing for STS  transfers SciFit bike seat 8 x8 minutes partial rotations  Gastroc stretches 2x30 seconds R LE  HS 2x30 seconds R LE     Manual  Knee  extension overpressure in supine  Foam roller R quad above incision Knee flexion overpressure in supine and seated    09/26/22  TherEx  SciFit bike seat 8 x8 minutes partial rotations for ROM SLRs + quad set x15 R LE  SLRs + ER with quad set x10 R LE  HS 3x30 seconds R LE  Bridges 3x5 with progressive knee flexion each round Seated knee self flexion AAROM from high mat table x12 with 5 second holds Attempted standing HS stretches on steps unable to tolerate due to medial R knee pain Forward step ups 4 inch step x12 leading with R LE  Gait training with focus on heel-toe pattern and equal step lengths/stance times   Manual  Knee extension overpressure x10 supine     09/24/22  Nustep x8 minutes L1 with UEs focus on ROM LAQ 2# x10 with 2 second holds  Knee overpressure extension stretch 2# x4 minutes  SLR + quad set x12 0# Forward step ups 4 inch step BUE support HS curls R LE x10 0#  Manual  Knee flexion OP seated to tolerance      09/20/22  Nustep L 4 6 min PROM flex and ext with end range stretching AROM seated  7-  85 PROM seated 5- 90 PERFORMED AND ISSUED HEP Step tap 4 inch 2 sets 10- assistance to prevent hip hiking Standing with RW hip flex marching and HS curl 2 sets 10 After ex act flexion 92      09/17/22  TherEx  Nustep with UEs L1 x8 minutes focus on knee ROM Quad sets x12 with 3 second holds  SAQs with bolster from PT 1x10  SLRs MinA from PT x10 Knee flexion PROM seated to tolerance x10  Education about WBAT, general recovery from TKR and exercise form, recommended use of towel bolster to cut down on LE ER in supine when trying to sleep    EVAL- 09/12/22    PATIENT EDUCATION:  Education details: POC and HEP Person educated: Patient Education method: Explanation Education comprehension: verbalized understanding  HOME EXERCISE PROGRAM: Access Code: [email or web address]  ASSESSMENT:  CLINICAL IMPRESSION:    Zaleigh arrives today doing ok,  she stopped taking her muscle relaxers and pain medication and was having a bit of extra pain/stiffness today. Worked on lower level exercises to help manage edema, also incorporated more manual techniques to address ROM today. Encouraged taking pain meds/muscle relaxers as prescribed at this point, still early on since her surgery.   OBJECTIVE IMPAIRMENTS: Abnormal gait, difficulty walking, decreased ROM, decreased strength, and pain.   ACTIVITY LIMITATIONS: bending, sitting, standing, squatting, sleeping, stairs, transfers, bathing, toileting, and locomotion level  PARTICIPATION LIMITATIONS: meal prep, cleaning, laundry, driving, shopping, and community activity  REHAB POTENTIAL: Good  CLINICAL DECISION MAKING: Stable/uncomplicated  EVALUATION COMPLEXITY: Low   GOALS: Goals reviewed with patient? Yes  SHORT TERM GOALS: Target date: 10/24/22  Patient will be independent with initial HEP. Goal status: 09/20/22 met  2.  Patient will be do TUG < 30s Baseline: 1 min 28s  Goal status: INITIAL  3.  Patient will be able to do 5 sit to stands  Baseline: unable to do  Goal status: INITIAL   LONG TERM GOALS: Target date: 12/05/22  Patient will be independent with advanced/ongoing HEP to improve outcomes and carryover.  Goal  status: INITIAL  2.  Patient will report at least 75% improvement in R knee pain to improve QOL. Baseline: 10/10 Goal status: INITIAL  3.  Patient will demonstrate improved R knee AROM to >/= 0-120 deg to allow for normal gait and stair mechanics. Baseline: 30-0-60 Goal status: INITIAL  4.  Patient will demonstrate improved functional LE strength as demonstrated by 5/5. Baseline: 2/5 Goal status: INITIAL  5.  Patient will be able to ambulate 500' with LRAD and normal gait pattern without increased pain to access community.  Baseline: using RW small in home ambulation distances Goal status: INITIAL  6.  Patient will report 64 on FOTO (patient reported  outcome measure) to demonstrate improved functional ability. Baseline: 4 Goal status: INITIAL  PLAN:  PT FREQUENCY: 2x/week  PT DURATION: 12 weeks  PLANNED INTERVENTIONS: Therapeutic exercises, Therapeutic activity, Neuromuscular re-education, Balance training, Gait training, Patient/Family education, Self Care, Joint mobilization, Stair training, Electrical stimulation, Cryotherapy, Moist heat, scar mobilization, Vasopneumatic device, and Manual therapy  PLAN FOR NEXT SESSION: PROM, AAROM to R knee, vaso for pain and swelling, gait training/enforce WBAT, manual as desired   Nedra Hai, PT, DPT 09/30/22 4:41 PM    [facility] [address] [facility] [place], [place], [address] Phone: [phone]   Fax:  [phone]

## 2022-09-30 NOTE — Telephone Encounter (Signed)
Patient has RF for all medications. She last filled 90-day supply so is not due for a RF yet, except for the alprazolam.

## 2022-10-02 ENCOUNTER — Ambulatory Visit: Payer: No Typology Code available for payment source | Admitting: Physical Therapy

## 2022-10-02 ENCOUNTER — Encounter: Payer: Self-pay | Admitting: Physical Therapy

## 2022-10-02 DIAGNOSIS — M25561 Pain in right knee: Secondary | ICD-10-CM

## 2022-10-02 DIAGNOSIS — Z96651 Presence of right artificial knee joint: Secondary | ICD-10-CM

## 2022-10-02 DIAGNOSIS — M6281 Muscle weakness (generalized): Secondary | ICD-10-CM

## 2022-10-02 DIAGNOSIS — M25661 Stiffness of right knee, not elsewhere classified: Secondary | ICD-10-CM

## 2022-10-02 NOTE — Therapy (Signed)
OUTPATIENT PHYSICAL THERAPY LOWER EXTREMITY TREATMENT   Patient Name: [name] MRN: [id number] DOB:[birth year], 62 y.o., female Today's Date: 10/02/2022  END OF SESSION:  PT End of Session - 10/02/22 1608     Visit Number 7    Date for PT Re-Evaluation 12/05/22    Authorization Type U[employer]    PT Start Time 1600    PT Stop Time 1643    PT Time Calculation (min) 43 min    Activity Tolerance Patient limited by pain;Patient tolerated treatment well    Behavior During Therapy [facility] for tasks assessed/performed                  Past Medical History:  Diagnosis Date   Arthritis    Back pain of lumbar region with sciatica    Cancer (HCC)    breast   Depression    Diabetes mellitus, type II (HCC)    Family history of anesthesia complication    grandfather died under anesthesia 70's- had heart issues   H/O carpal tunnel repair 2014   Headache(784.0)    Hyperlipidemia    Hypertension    Hypothyroidism    Medial meniscus tear    PONV (postoperative nausea and vomiting)    Sleep apnea    cpap since 10   Past Surgical History:  Procedure Laterality Date   BILATERAL TOTAL MASTECTOMY WITH AXILLARY LYMPH NODE DISSECTION     BREAST RECONSTRUCTION     CERVICAL DISC ARTHROPLASTY N/A 03/19/2013   Procedure: CERVICAL FIVE TO SIX, CERVICAL SIX TO SEVEN CERVICAL ANTERIOR DISC ARTHROPLASTY;  Surgeon: Barnett Abu, MD;  Location: [facility];  Service: Neurosurgery;  Laterality: N/A;  C56 C67 artificial disc replacement   CESAREAN SECTION     COLONOSCOPY     DILATATION & CURETTAGE/HYSTEROSCOPY WITH TRUECLEAR N/A 11/25/2013   Procedure: DILATATION & CURETTAGE/HYSTEROSCOPY WITH TRUCLEAR;  Surgeon: Meriel Pica, MD;  Location: [facility];  Service: Gynecology;  Laterality: N/A;   ECTOPIC PREGNANCY SURGERY     GALLBLADDER SURGERY     MOUTH SURGERY     child- dog bite   SPINAL FUSION     2023   STERIOD INJECTION Left 09/09/2022   Procedure: LEFT KNEE STEROID INJECTION;   Surgeon: Ollen Gross, MD;  Location: [facility];  Service: Orthopedics;  Laterality: Left;  left knee injection 0928   TONSILLECTOMY     TOTAL KNEE ARTHROPLASTY Right 09/09/2022   Procedure: TOTAL KNEE ARTHROPLASTY;  Surgeon: Ollen Gross, MD;  Location: [facility];  Service: Orthopedics;  Laterality: Right;   TUBAL LIGATION     Patient Active Problem List   Diagnosis Date Noted   OA (osteoarthritis) of knee 09/09/2022   Exertional dyspnea 04/17/2022   Elevated coronary artery calcium score 04/17/2022   Spondylolisthesis at L4-L5 level 12/27/2021   Ductal carcinoma in situ (DCIS) of left breast 01/19/2018   Generalized anxiety disorder 11/07/2017   Depression 11/07/2017   Insomnia 11/07/2017   Cervical spondylosis 03/19/2013    PCP: Creola Corn  REFERRING PROVIDER: Trudee Grip  REFERRING DIAG: R TKA 09/09/22  THERAPY DIAG:  S/P total knee arthroplasty, right  Acute pain of right knee  Stiffness of right knee, not elsewhere classified  Muscle weakness (generalized)  Rationale for Evaluation and Treatment: Rehabilitation  ONSET DATE: 09/09/22  SUBJECTIVE:   SUBJECTIVE STATEMENT:  Doing OK, back on my medicines but still having some pain, did take it late today.   PERTINENT HISTORY: R TKA 8/19 L knee steroid injection  09/09/22  PAIN:  Are you having pain? Yes: NPRS scale: 6/10 Pain location: R knee Pain description: sharp medial R knee  Aggravating factors: weight bearing on the RLE, bending, moving it  Relieving factors: ice helps some, pain meds some but I don't think it is enough  PRECAUTIONS: None  RED FLAGS: None   WEIGHT BEARING RESTRICTIONS: No  FALLS:  Has patient fallen in last 6 months? No  LIVING ENVIRONMENT: Lives with: lives with their family and lives with their spouse Lives in: House/apartment Stairs: Yes: External: 3 steps; none Has following equipment at home: Single point cane and Walker - 2 wheeled  OCCUPATION: Not working  PLOF:  Independent  PATIENT GOALS: No pain, to keep the knee moving  NEXT MD VISIT: 09/24/22  OBJECTIVE:   PATIENT SURVEYS:  FOTO 4  COGNITION: Overall cognitive status: Within functional limits for tasks assessed      EDEMA:  Increased swelling surrounding R knee  POSTURE: rounded shoulders and flexed trunk   PALPATION: Warm and TTP  LOWER EXTREMITY ROM:  Active ROM Right eval Left eval Right 09/24/22 Right 09/26/22 Right 10/02/22  Hip flexion       Hip extension       Hip abduction       Hip adduction       Hip internal rotation       Hip external rotation       Knee flexion  60  75* AROM, 86 PROM 82* AROM seated per pt request  78* self AAROM, 76* AROM   Knee extension -30  -15* AROM seated, -15 PROM supine   -16* supine heel prop with quad set   Ankle dorsiflexion       Ankle plantarflexion       Ankle inversion       Ankle eversion        (Blank rows = not tested)  LOWER EXTREMITY MMT:  MMT Right eval Left eval  Hip flexion 2   Hip extension    Hip abduction    Hip adduction    Hip internal rotation    Hip external rotation    Knee flexion 2   Knee extension 2   Ankle dorsiflexion    Ankle plantarflexion    Ankle inversion    Ankle eversion     (Blank rows = not tested)   FUNCTIONAL TESTS:  5 times sit to stand: unable to do  Timed up and go (TUG): 1 min 28s  GAIT: Distance walked: in clinic distances Assistive device utilized: Environmental consultant - 2 wheeled Level of assistance: SBA Comments: antalgic gait, slowed cadence, step to pattern, decreased step length and time on RLE    TODAY'S TREATMENT:                                                                                                                              DATE:   10/02/22   TherEx  Scifit  bike seat 9-->8 for ROM partial rotations x8 minutes  Knee AAROM flexion 12x5 second holds R LE  HS curls red TB x10  Standing TKE x20 with red TB cues to activate quad instead of glute SLRs + quad set  x20 R LE   Manual  Foam roller R quad above incision line  Seated knee flexion overpressure x10    09/30/22  TherEx  Reviewed good transfer mechanics/hand and foot placement and sequencing for STS transfers SciFit bike seat 8 x8 minutes partial rotations  Gastroc stretches 2x30 seconds R LE  HS 2x30 seconds R LE     Manual  Knee extension overpressure in supine  Foam roller R quad above incision Knee flexion overpressure in supine and seated    09/26/22  TherEx  SciFit bike seat 8 x8 minutes partial rotations for ROM SLRs + quad set x15 R LE  SLRs + ER with quad set x10 R LE  HS 3x30 seconds R LE  Bridges 3x5 with progressive knee flexion each round Seated knee self flexion AAROM from high mat table x12 with 5 second holds Attempted standing HS stretches on steps unable to tolerate due to medial R knee pain Forward step ups 4 inch step x12 leading with R LE  Gait training with focus on heel-toe pattern and equal step lengths/stance times   Manual  Knee extension overpressure x10 supine     09/24/22  Nustep x8 minutes L1 with UEs focus on ROM LAQ 2# x10 with 2 second holds  Knee overpressure extension stretch 2# x4 minutes  SLR + quad set x12 0# Forward step ups 4 inch step BUE support HS curls R LE x10 0#  Manual  Knee flexion OP seated to tolerance      09/20/22  Nustep L 4 6 min PROM flex and ext with end range stretching AROM seated  7-  85 PROM seated 5- 90 PERFORMED AND ISSUED HEP Step tap 4 inch 2 sets 10- assistance to prevent hip hiking Standing with RW hip flex marching and HS curl 2 sets 10 After ex act flexion 92      09/17/22  TherEx  Nustep with UEs L1 x8 minutes focus on knee ROM Quad sets x12 with 3 second holds  SAQs with bolster from PT 1x10  SLRs MinA from PT x10 Knee flexion PROM seated to tolerance x10  Education about WBAT, general recovery from TKR and exercise form, recommended use of towel bolster to cut  down on LE ER in supine when trying to sleep    EVAL- 09/12/22    PATIENT EDUCATION:  Education details: POC and HEP Person educated: Patient Education method: Explanation Education comprehension: verbalized understanding  HOME EXERCISE PROGRAM: Access Code: [email or web address]  ASSESSMENT:  CLINICAL IMPRESSION:    Byrle arrives today doing much better, returned to taking pain/muscle relaxors as prescribed and was able to tolerate more skilled PT interventions today. Progressed ROM and strengthening as appropriate, continued to encourage good habits regarding ice/elevation/edema control at home.   OBJECTIVE IMPAIRMENTS: Abnormal gait, difficulty walking, decreased ROM, decreased strength, and pain.   ACTIVITY LIMITATIONS: bending, sitting, standing, squatting, sleeping, stairs, transfers, bathing, toileting, and locomotion level  PARTICIPATION LIMITATIONS: meal prep, cleaning, laundry, driving, shopping, and community activity  REHAB POTENTIAL: Good  CLINICAL DECISION MAKING: Stable/uncomplicated  EVALUATION COMPLEXITY: Low   GOALS: Goals reviewed with patient? Yes  SHORT TERM GOALS: Target date: 10/24/22  Patient will be independent with initial HEP. Goal status: 09/20/22 met  2.  Patient will be do TUG < 30s Baseline: 1 min 28s  Goal status: INITIAL  3.  Patient will be able to do 5 sit to stands  Baseline: unable to do  Goal status: INITIAL   LONG TERM GOALS: Target date: 12/05/22  Patient will be independent with advanced/ongoing HEP to improve outcomes and carryover.  Goal status: INITIAL  2.  Patient will report at least 75% improvement in R knee pain to improve QOL. Baseline: 10/10 Goal status: INITIAL  3.  Patient will demonstrate improved R knee AROM to >/= 0-120 deg to allow for normal gait and stair mechanics. Baseline: 30-0-60 Goal status: INITIAL  4.  Patient will demonstrate improved functional LE strength as demonstrated by 5/5. Baseline: 2/5 Goal  status: INITIAL  5.  Patient will be able to ambulate 500' with LRAD and normal gait pattern without increased pain to access community.  Baseline: using RW small in home ambulation distances Goal status: INITIAL  6.  Patient will report 63 on FOTO (patient reported outcome measure) to demonstrate improved functional ability. Baseline: 4 Goal status: INITIAL  PLAN:  PT FREQUENCY: 2x/week  PT DURATION: 12 weeks  PLANNED INTERVENTIONS: Therapeutic exercises, Therapeutic activity, Neuromuscular re-education, Balance training, Gait training, Patient/Family education, Self Care, Joint mobilization, Stair training, Electrical stimulation, Cryotherapy, Moist heat, scar mobilization, Vasopneumatic device, and Manual therapy  PLAN FOR NEXT SESSION: PROM, AAROM to R knee, vaso for pain and swelling PRN, gait training/enforce WBAT, manual as desired   Nedra Hai, PT, DPT 10/02/22 4:49 PM    [facility] at [facility] [address] [facility] [place], [place], [address] Phone: [phone]   Fax:  [phone]

## 2022-10-04 ENCOUNTER — Ambulatory Visit: Payer: No Typology Code available for payment source | Admitting: Physical Therapy

## 2022-10-04 ENCOUNTER — Encounter: Payer: Self-pay | Admitting: Physical Therapy

## 2022-10-04 ENCOUNTER — Other Ambulatory Visit (HOSPITAL_COMMUNITY): Payer: No Typology Code available for payment source

## 2022-10-04 DIAGNOSIS — Z96651 Presence of right artificial knee joint: Secondary | ICD-10-CM

## 2022-10-04 DIAGNOSIS — M25661 Stiffness of right knee, not elsewhere classified: Secondary | ICD-10-CM

## 2022-10-04 DIAGNOSIS — R600 Localized edema: Secondary | ICD-10-CM

## 2022-10-04 DIAGNOSIS — R2689 Other abnormalities of gait and mobility: Secondary | ICD-10-CM

## 2022-10-04 DIAGNOSIS — M6281 Muscle weakness (generalized): Secondary | ICD-10-CM

## 2022-10-04 DIAGNOSIS — M25561 Pain in right knee: Secondary | ICD-10-CM

## 2022-10-04 NOTE — Therapy (Signed)
OUTPATIENT PHYSICAL THERAPY LOWER EXTREMITY TREATMENT   Patient Name: [name] MRN: [id number] DOB:1960-08-01, 62 y.o., female Today's Date: 10/04/2022  END OF SESSION:  PT End of Session - 10/04/22 1058     Visit Number 8    Date for PT Re-Evaluation 12/05/22    Authorization Type UHC    PT Start Time 1058    PT Stop Time 1148    PT Time Calculation (min) 50 min    Activity Tolerance Patient limited by pain;Patient tolerated treatment well    Behavior During Therapy [facility] for tasks assessed/performed                  Past Medical History:  Diagnosis Date   Arthritis    Back pain of lumbar region with sciatica    Cancer (HCC)    breast   Depression    Diabetes mellitus, type II (HCC)    Family history of anesthesia complication    grandfather died under anesthesia 70's- had heart issues   H/O carpal tunnel repair 2014   Headache(784.0)    Hyperlipidemia    Hypertension    Hypothyroidism    Medial meniscus tear    PONV (postoperative nausea and vomiting)    Sleep apnea    cpap since 10   Past Surgical History:  Procedure Laterality Date   BILATERAL TOTAL MASTECTOMY WITH AXILLARY LYMPH NODE DISSECTION     BREAST RECONSTRUCTION     CERVICAL DISC ARTHROPLASTY N/A 03/19/2013   Procedure: CERVICAL FIVE TO SIX, CERVICAL SIX TO SEVEN CERVICAL ANTERIOR DISC ARTHROPLASTY;  Surgeon: Barnett Abu, MD;  Location: [facility];  Service: Neurosurgery;  Laterality: N/A;  C56 C67 artificial disc replacement   CESAREAN SECTION     COLONOSCOPY     DILATATION & CURETTAGE/HYSTEROSCOPY WITH TRUECLEAR N/A 11/25/2013   Procedure: DILATATION & CURETTAGE/HYSTEROSCOPY WITH TRUCLEAR;  Surgeon: Meriel Pica, MD;  Location: [facility];  Service: Gynecology;  Laterality: N/A;   ECTOPIC PREGNANCY SURGERY     GALLBLADDER SURGERY     MOUTH SURGERY     child- dog bite   SPINAL FUSION     2023   STERIOD INJECTION Left 09/09/2022   Procedure: LEFT KNEE STEROID INJECTION;   Surgeon: Ollen Gross, MD;  Location: [facility];  Service: Orthopedics;  Laterality: Left;  left knee injection 0928   TONSILLECTOMY     TOTAL KNEE ARTHROPLASTY Right 09/09/2022   Procedure: TOTAL KNEE ARTHROPLASTY;  Surgeon: Ollen Gross, MD;  Location: [facility];  Service: Orthopedics;  Laterality: Right;   TUBAL LIGATION     Patient Active Problem List   Diagnosis Date Noted   OA (osteoarthritis) of knee 09/09/2022   Exertional dyspnea 04/17/2022   Elevated coronary artery calcium score 04/17/2022   Spondylolisthesis at L4-L5 level 12/27/2021   Ductal carcinoma in situ (DCIS) of left breast 01/19/2018   Generalized anxiety disorder 11/07/2017   Depression 11/07/2017   Insomnia 11/07/2017   Cervical spondylosis 03/19/2013    PCP: Creola Corn  REFERRING PROVIDER: Trudee Grip  REFERRING DIAG: R TKA 09/09/22  THERAPY DIAG:  S/P total knee arthroplasty, right  Acute pain of right knee  Stiffness of right knee, not elsewhere classified  Muscle weakness (generalized)  Other abnormalities of gait and mobility  Localized edema  Rationale for Evaluation and Treatment: Rehabilitation  ONSET DATE: 09/09/22  SUBJECTIVE:   SUBJECTIVE STATEMENT:  Stiff and sore, trying to do my bike at home some  PERTINENT HISTORY: R TKA 8/19  L knee steroid injection 09/09/22  PAIN:  Are you having pain? Yes: NPRS scale: 6/10 Pain location: R knee Pain description: sharp medial R knee  Aggravating factors: weight bearing on the RLE, bending, moving it  Relieving factors: ice helps some, pain meds some but I don't think it is enough  PRECAUTIONS: None  RED FLAGS: None   WEIGHT BEARING RESTRICTIONS: No  FALLS:  Has patient fallen in last 6 months? No  LIVING ENVIRONMENT: Lives with: lives with their family and lives with their spouse Lives in: House/apartment Stairs: Yes: External: 3 steps; none Has following equipment at home: Single point cane and Walker - 2  wheeled  OCCUPATION: Not working  PLOF: Independent  PATIENT GOALS: No pain, to keep the knee moving  NEXT MD VISIT: 09/24/22  OBJECTIVE:   PATIENT SURVEYS:  FOTO 4  COGNITION: Overall cognitive status: Within functional limits for tasks assessed      EDEMA:  Increased swelling surrounding R knee  POSTURE: rounded shoulders and flexed trunk   PALPATION: Warm and TTP  LOWER EXTREMITY ROM:  Active ROM Right eval Left eval Right 09/24/22 Right 09/26/22 Right 10/02/22  Hip flexion       Hip extension       Hip abduction       Hip adduction       Hip internal rotation       Hip external rotation       Knee flexion  60  75* AROM, 86 PROM 82* AROM seated per pt request  78* self AAROM, 76* AROM   Knee extension -30  -15* AROM seated, -15 PROM supine   -16* supine heel prop with quad set   Ankle dorsiflexion       Ankle plantarflexion       Ankle inversion       Ankle eversion        (Blank rows = not tested)  LOWER EXTREMITY MMT:  MMT Right eval Left eval  Hip flexion 2   Hip extension    Hip abduction    Hip adduction    Hip internal rotation    Hip external rotation    Knee flexion 2   Knee extension 2   Ankle dorsiflexion    Ankle plantarflexion    Ankle inversion    Ankle eversion     (Blank rows = not tested)   FUNCTIONAL TESTS:  5 times sit to stand: unable to do  Timed up and go (TUG): 1 min 28s  GAIT: Distance walked: in clinic distances Assistive device utilized: Environmental consultant - 2 wheeled Level of assistance: SBA Comments: antalgic gait, slowed cadence, step to pattern, decreased step length and time on RLE    TODAY'S TREATMENT:                                                                                                                              DATE:  10/04/22 PROM, gentle  distractions, gentle STM and scar mobs Some contract relax to hlp with ROM Nustep level 5 x 6 minutes 20# leg curls 2x10 Leg press 20# Leg press no weight right leg  only 2x10 SAQ 2x10 a lot of cues to use the quad and not the hip flexor Feet on ball K2C, bridges Went back at the end and did more PROM and educated on the chair scoot for ROM of flexion  10/02/22 TherEx  Scifit bike seat 9-->8 for ROM partial rotations x8 minutes  Knee AAROM flexion 12x5 second holds R LE  HS curls red TB x10  Standing TKE x20 with red TB cues to activate quad instead of glute SLRs + quad set x20 R LE   Manual  Foam roller R quad above incision line  Seated knee flexion overpressure x10    09/30/22  TherEx  Reviewed good transfer mechanics/hand and foot placement and sequencing for STS transfers SciFit bike seat 8 x8 minutes partial rotations  Gastroc stretches 2x30 seconds R LE  HS 2x30 seconds R LE     Manual  Knee extension overpressure in supine  Foam roller R quad above incision Knee flexion overpressure in supine and seated    09/26/22  TherEx  SciFit bike seat 8 x8 minutes partial rotations for ROM SLRs + quad set x15 R LE  SLRs + ER with quad set x10 R LE  HS 3x30 seconds R LE  Bridges 3x5 with progressive knee flexion each round Seated knee self flexion AAROM from high mat table x12 with 5 second holds Attempted standing HS stretches on steps unable to tolerate due to medial R knee pain Forward step ups 4 inch step x12 leading with R LE  Gait training with focus on heel-toe pattern and equal step lengths/stance times   Manual  Knee extension overpressure x10 supine     09/24/22  Nustep x8 minutes L1 with UEs focus on ROM LAQ 2# x10 with 2 second holds  Knee overpressure extension stretch 2# x4 minutes  SLR + quad set x12 0# Forward step ups 4 inch step BUE support HS curls R LE x10 0#  Manual  Knee flexion OP seated to tolerance      09/20/22  Nustep L 4 6 min PROM flex and ext with end range stretching AROM seated  7-  85 PROM seated 5- 90 PERFORMED AND ISSUED HEP Step tap 4 inch 2 sets 10- assistance to  prevent hip hiking Standing with RW hip flex marching and HS curl 2 sets 10 After ex act flexion 92  09/17/22  TherEx  Nustep with UEs L1 x8 minutes focus on knee ROM Quad sets x12 with 3 second holds  SAQs with bolster from PT 1x10  SLRs MinA from PT x10 Knee flexion PROM seated to tolerance x10  Education about WBAT, general recovery from TKR and exercise form, recommended use of towel bolster to cut down on LE ER in supine when trying to sleep    EVAL- 09/12/22    PATIENT EDUCATION:  Education details: POC and HEP Person educated: Patient Education method: Explanation Education comprehension: verbalized understanding  HOME EXERCISE PROGRAM: Access Code: [email or web address]  ASSESSMENT:  CLINICAL IMPRESSION:  Patient continues to be very stiff, tight and guarded, needs a lot of cues to decrease hip hike and guarding.  She needs a lot of encouragement to relax and allow the passive stretch, tends to use hip flexor and not the quad for extension.  I asked her to  do the chair scoot stretch at home to help with the flexion   OBJECTIVE IMPAIRMENTS: Abnormal gait, difficulty walking, decreased ROM, decreased strength, and pain.   ACTIVITY LIMITATIONS: bending, sitting, standing, squatting, sleeping, stairs, transfers, bathing, toileting, and locomotion level  PARTICIPATION LIMITATIONS: meal prep, cleaning, laundry, driving, shopping, and community activity  REHAB POTENTIAL: Good  CLINICAL DECISION MAKING: Stable/uncomplicated  EVALUATION COMPLEXITY: Low   GOALS: Goals reviewed with patient? Yes  SHORT TERM GOALS: Target date: 10/24/22  Patient will be independent with initial HEP. Goal status: 09/20/22 met  2.  Patient will be do TUG < 30s Baseline: 1 min 28s  Goal status: INITIAL  3.  Patient will be able to do 5 sit to stands  Baseline: unable to do  Goal status: progressing 10/04/22   LONG TERM GOALS: Target date: 12/05/22  Patient will be independent with  advanced/ongoing HEP to improve outcomes and carryover.  Goal status: INITIAL  2.  Patient will report at least 75% improvement in R knee pain to improve QOL. Baseline: 10/10 Goal status: INITIAL  3.  Patient will demonstrate improved R knee AROM to >/= 0-120 deg to allow for normal gait and stair mechanics. Baseline: 30-0-60 Goal status: INITIAL  4.  Patient will demonstrate improved functional LE strength as demonstrated by 5/5. Baseline: 2/5 Goal status: INITIAL  5.  Patient will be able to ambulate 500' with LRAD and normal gait pattern without increased pain to access community.  Baseline: using RW small in home ambulation distances Goal status: INITIAL  6.  Patient will report 41 on FOTO (patient reported outcome measure) to demonstrate improved functional ability. Baseline: 4 Goal status: INITIAL  PLAN:  PT FREQUENCY: 2x/week  PT DURATION: 12 weeks  PLANNED INTERVENTIONS: Therapeutic exercises, Therapeutic activity, Neuromuscular re-education, Balance training, Gait training, Patient/Family education, Self Care, Joint mobilization, Stair training, Electrical stimulation, Cryotherapy, Moist heat, scar mobilization, Vasopneumatic device, and Manual therapy  PLAN FOR NEXT SESSION: really encourage the bending and function, get her to use the mms more, may try walking more  Stacie Glaze, PT  10/04/22 10:58 AM    [facility] [address] [facility] [place], [place], [address] Phone: [phone]   Fax:  [phone]

## 2022-10-07 ENCOUNTER — Ambulatory Visit: Payer: No Typology Code available for payment source | Admitting: Physical Therapy

## 2022-10-07 ENCOUNTER — Encounter: Payer: Self-pay | Admitting: Physical Therapy

## 2022-10-07 DIAGNOSIS — R2689 Other abnormalities of gait and mobility: Secondary | ICD-10-CM

## 2022-10-07 DIAGNOSIS — M25661 Stiffness of right knee, not elsewhere classified: Secondary | ICD-10-CM

## 2022-10-07 DIAGNOSIS — M25561 Pain in right knee: Secondary | ICD-10-CM

## 2022-10-07 DIAGNOSIS — Z96651 Presence of right artificial knee joint: Secondary | ICD-10-CM

## 2022-10-07 DIAGNOSIS — M6281 Muscle weakness (generalized): Secondary | ICD-10-CM

## 2022-10-07 DIAGNOSIS — R600 Localized edema: Secondary | ICD-10-CM

## 2022-10-07 NOTE — Therapy (Signed)
OUTPATIENT PHYSICAL THERAPY LOWER EXTREMITY TREATMENT   Patient Name: [name] MRN: [id number] DOB:Aug 22, 1960, 62 y.o., female Today's Date: 10/07/2022  END OF SESSION:  PT End of Session - 10/07/22 1535     Visit Number 9    Date for PT Re-Evaluation 12/05/22    Authorization Type UHC    PT Start Time 1530    PT Stop Time 1625    PT Time Calculation (min) 55 min    Activity Tolerance Patient limited by pain;Patient tolerated treatment well    Behavior During Therapy [facility] for tasks assessed/performed                  Past Medical History:  Diagnosis Date   Arthritis    Back pain of lumbar region with sciatica    Cancer (HCC)    breast   Depression    Diabetes mellitus, type II (HCC)    Family history of anesthesia complication    grandfather died under anesthesia 70's- had heart issues   H/O carpal tunnel repair 2014   Headache(784.0)    Hyperlipidemia    Hypertension    Hypothyroidism    Medial meniscus tear    PONV (postoperative nausea and vomiting)    Sleep apnea    cpap since 10   Past Surgical History:  Procedure Laterality Date   BILATERAL TOTAL MASTECTOMY WITH AXILLARY LYMPH NODE DISSECTION     BREAST RECONSTRUCTION     CERVICAL DISC ARTHROPLASTY N/A 03/19/2013   Procedure: CERVICAL FIVE TO SIX, CERVICAL SIX TO SEVEN CERVICAL ANTERIOR DISC ARTHROPLASTY;  Surgeon: Barnett Abu, MD;  Location: [facility];  Service: Neurosurgery;  Laterality: N/A;  C56 C67 artificial disc replacement   CESAREAN SECTION     COLONOSCOPY     DILATATION & CURETTAGE/HYSTEROSCOPY WITH TRUECLEAR N/A 11/25/2013   Procedure: DILATATION & CURETTAGE/HYSTEROSCOPY WITH TRUCLEAR;  Surgeon: Meriel Pica, MD;  Location: [facility];  Service: Gynecology;  Laterality: N/A;   ECTOPIC PREGNANCY SURGERY     GALLBLADDER SURGERY     MOUTH SURGERY     child- dog bite   SPINAL FUSION     2023   STERIOD INJECTION Left 09/09/2022   Procedure: LEFT KNEE STEROID INJECTION;   Surgeon: Ollen Gross, MD;  Location: [facility];  Service: Orthopedics;  Laterality: Left;  left knee injection 0928   TONSILLECTOMY     TOTAL KNEE ARTHROPLASTY Right 09/09/2022   Procedure: TOTAL KNEE ARTHROPLASTY;  Surgeon: Ollen Gross, MD;  Location: [facility];  Service: Orthopedics;  Laterality: Right;   TUBAL LIGATION     Patient Active Problem List   Diagnosis Date Noted   OA (osteoarthritis) of knee 09/09/2022   Exertional dyspnea 04/17/2022   Elevated coronary artery calcium score 04/17/2022   Spondylolisthesis at L4-L5 level 12/27/2021   Ductal carcinoma in situ (DCIS) of left breast 01/19/2018   Generalized anxiety disorder 11/07/2017   Depression 11/07/2017   Insomnia 11/07/2017   Cervical spondylosis 03/19/2013    PCP: Creola Corn  REFERRING PROVIDER: Trudee Grip  REFERRING DIAG: R TKA 09/09/22  THERAPY DIAG:  S/P total knee arthroplasty, right  Acute pain of right knee  Stiffness of right knee, not elsewhere classified  Muscle weakness (generalized)  Other abnormalities of gait and mobility  Localized edema  Rationale for Evaluation and Treatment: Rehabilitation  ONSET DATE: 09/09/22  SUBJECTIVE:   SUBJECTIVE STATEMENT:  I don't think the ROM is getting better, I tried my bike every day over the weekend  PERTINENT HISTORY: R TKA 8/19 L knee steroid injection 09/09/22  PAIN:  Are you having pain? Yes: NPRS scale: 6/10 Pain location: R knee Pain description: sharp medial R knee  Aggravating factors: weight bearing on the RLE, bending, moving it  Relieving factors: ice helps some, pain meds some but I don't think it is enough  PRECAUTIONS: None  RED FLAGS: None   WEIGHT BEARING RESTRICTIONS: No  FALLS:  Has patient fallen in last 6 months? No  LIVING ENVIRONMENT: Lives with: lives with their family and lives with their spouse Lives in: House/apartment Stairs: Yes: External: 3 steps; none Has following equipment at home: Single point cane  and Walker - 2 wheeled  OCCUPATION: Not working  PLOF: Independent  PATIENT GOALS: No pain, to keep the knee moving  NEXT MD VISIT: 09/24/22  OBJECTIVE:   PATIENT SURVEYS:  FOTO 4  COGNITION: Overall cognitive status: Within functional limits for tasks assessed      EDEMA:  Increased swelling surrounding R knee  POSTURE: rounded shoulders and flexed trunk   PALPATION: Warm and TTP  LOWER EXTREMITY ROM:  Active ROM Right eval Left eval Right 09/24/22 Right 09/26/22 Right 10/02/22  Hip flexion       Hip extension       Hip abduction       Hip adduction       Hip internal rotation       Hip external rotation       Knee flexion  60  75* AROM, 86 PROM 82* AROM seated per pt request  78* self AAROM, 76* AROM   Knee extension -30  -15* AROM seated, -15 PROM supine   -16* supine heel prop with quad set   Ankle dorsiflexion       Ankle plantarflexion       Ankle inversion       Ankle eversion        (Blank rows = not tested)  LOWER EXTREMITY MMT:  MMT Right eval Left eval  Hip flexion 2   Hip extension    Hip abduction    Hip adduction    Hip internal rotation    Hip external rotation    Knee flexion 2   Knee extension 2   Ankle dorsiflexion    Ankle plantarflexion    Ankle inversion    Ankle eversion     (Blank rows = not tested)   FUNCTIONAL TESTS:  5 times sit to stand: unable to do  Timed up and go (TUG): 1 min 28s  GAIT: Distance walked: in clinic distances Assistive device utilized: Environmental consultant - 2 wheeled Level of assistance: SBA Comments: antalgic gait, slowed cadence, step to pattern, decreased step length and time on RLE    TODAY'S TREATMENT:  DATE:  10/07/22 Nustep level 4 x 6 minutes Leg press 20#, no weight , working on ROM Leg ext 5# 3x10 Leg curls 20# 2x10, right only 10# 2x10 PROM, contract relax, STM to the thigh,  hip and scar to get her to relax and allow the PROM, gentle joint distraction and oscillations, some joint mobs, at the end of the session I measured AROM of the right knee to 85 degrees flexion  10/04/22 PROM, gentle distractions, gentle STM and scar mobs Some contract relax to hlp with ROM Nustep level 5 x 6 minutes 20# leg curls 2x10 Leg press 20# Leg press no weight right leg only 2x10 SAQ 2x10 a lot of cues to use the quad and not the hip flexor Feet on ball K2C, bridges Went back at the end and did more PROM and educated on the chair scoot for ROM of flexion  10/02/22 TherEx  Scifit bike seat 9-->8 for ROM partial rotations x8 minutes  Knee AAROM flexion 12x5 second holds R LE  HS curls red TB x10  Standing TKE x20 with red TB cues to activate quad instead of glute SLRs + quad set x20 R LE   Manual  Foam roller R quad above incision line  Seated knee flexion overpressure x10    09/30/22  TherEx  Reviewed good transfer mechanics/hand and foot placement and sequencing for STS transfers SciFit bike seat 8 x8 minutes partial rotations  Gastroc stretches 2x30 seconds R LE  HS 2x30 seconds R LE     Manual  Knee extension overpressure in supine  Foam roller R quad above incision Knee flexion overpressure in supine and seated    09/26/22  TherEx  SciFit bike seat 8 x8 minutes partial rotations for ROM SLRs + quad set x15 R LE  SLRs + ER with quad set x10 R LE  HS 3x30 seconds R LE  Bridges 3x5 with progressive knee flexion each round Seated knee self flexion AAROM from high mat table x12 with 5 second holds Attempted standing HS stretches on steps unable to tolerate due to medial R knee pain Forward step ups 4 inch step x12 leading with R LE  Gait training with focus on heel-toe pattern and equal step lengths/stance times   Manual  Knee extension overpressure x10 supine     09/24/22  Nustep x8 minutes L1 with UEs focus on ROM LAQ 2# x10 with 2 second  holds  Knee overpressure extension stretch 2# x4 minutes  SLR + quad set x12 0# Forward step ups 4 inch step BUE support HS curls R LE x10 0#  Manual  Knee flexion OP seated to tolerance      09/20/22  Nustep L 4 6 min PROM flex and ext with end range stretching AROM seated  7-  85 PROM seated 5- 90 PERFORMED AND ISSUED HEP Step tap 4 inch 2 sets 10- assistance to prevent hip hiking Standing with RW hip flex marching and HS curl 2 sets 10 After ex act flexion 92  09/17/22  TherEx  Nustep with UEs L1 x8 minutes focus on knee ROM Quad sets x12 with 3 second holds  SAQs with bolster from PT 1x10  SLRs MinA from PT x10 Knee flexion PROM seated to tolerance x10  Education about WBAT, general recovery from TKR and exercise form, recommended use of towel bolster to cut down on LE ER in supine when trying to sleep    EVAL- 09/12/22    PATIENT EDUCATION:  Education details: POC and HEP Person educated: Patient Education method: Explanation Education comprehension: verbalized understanding  HOME EXERCISE PROGRAM: Access Code: [email or web address]  ASSESSMENT:  CLINICAL IMPRESSION:  Patient continues to be very stiff, tight and guarded, needs a lot of cues to decrease hip hike and guarding.  I used a lot of different ways to try to get her to relax and allow the movements, as noted above, I feel that I did get some good scar mobilizations and by the end she was not as guarded and tense.  Still very poor ROM, does see the MD next week   OBJECTIVE IMPAIRMENTS: Abnormal gait, difficulty walking, decreased ROM, decreased strength, and pain.   ACTIVITY LIMITATIONS: bending, sitting, standing, squatting, sleeping, stairs, transfers, bathing, toileting, and locomotion level  PARTICIPATION LIMITATIONS: meal prep, cleaning, laundry, driving, shopping, and community activity  REHAB POTENTIAL: Good  CLINICAL DECISION MAKING: Stable/uncomplicated  EVALUATION COMPLEXITY:  Low   GOALS: Goals reviewed with patient? Yes  SHORT TERM GOALS: Target date: 10/24/22  Patient will be independent with initial HEP. Goal status: 09/20/22 met  2.  Patient will be do TUG < 30s Baseline: 1 min 28s  Goal status: ongoing 10/07/22  3.  Patient will be able to do 5 sit to stands  Baseline: unable to do  Goal status: progressing 10/04/22   LONG TERM GOALS: Target date: 12/05/22  Patient will be independent with advanced/ongoing HEP to improve outcomes and carryover.  Goal status: INITIAL  2.  Patient will report at least 75% improvement in R knee pain to improve QOL. Baseline: 10/10 Goal status: INITIAL  3.  Patient will demonstrate improved R knee AROM to >/= 0-120 deg to allow for normal gait and stair mechanics. Baseline: 30-0-60 Goal status: INITIAL  4.  Patient will demonstrate improved functional LE strength as demonstrated by 5/5. Baseline: 2/5 Goal status: INITIAL  5.  Patient will be able to ambulate 500' with LRAD and normal gait pattern without increased pain to access community.  Baseline: using RW small in home ambulation distances Goal status: INITIAL  6.  Patient will report 47 on FOTO (patient reported outcome measure) to demonstrate improved functional ability. Baseline: 4 Goal status: INITIAL  PLAN:  PT FREQUENCY: 2x/week  PT DURATION: 12 weeks  PLANNED INTERVENTIONS: Therapeutic exercises, Therapeutic activity, Neuromuscular re-education, Balance training, Gait training, Patient/Family education, Self Care, Joint mobilization, Stair training, Electrical stimulation, Cryotherapy, Moist heat, scar mobilization, Vasopneumatic device, and Manual therapy  PLAN FOR NEXT SESSION: really encourage the bending and function, get her to use the mms more, may try walking more  Stacie Glaze, PT  10/07/22 3:36 PM    [facility] [facility] at [facility] [address] [facility]. [place], [place], [address] Phone:  [phone]   Fax:  [phone]

## 2022-10-08 NOTE — Therapy (Signed)
OUTPATIENT PHYSICAL THERAPY LOWER EXTREMITY TREATMENT Progress Note Reporting Period 09/12/22 to 10/09/22  See note below for Objective Data and Assessment of Progress/Goals.      Patient Name: [name] MRN: [id number] DOB:[birth year], 62 y.o., female Today's Date: 10/09/2022  END OF SESSION:  PT End of Session - 10/09/22 1615     Visit Number 10    Date for PT Re-Evaluation 12/05/22    Authorization Type U[employer]    PT Start Time 1615    PT Stop Time 1700    PT Time Calculation (min) 45 min    Activity Tolerance Patient limited by pain;Patient tolerated treatment well    Behavior During Therapy [facility] for tasks assessed/performed                   Past Medical History:  Diagnosis Date   Arthritis    Back pain of lumbar region with sciatica    Cancer (HCC)    breast   Depression    Diabetes mellitus, type II (HCC)    Family history of anesthesia complication    grandfather died under anesthesia 70's- had heart issues   H/O carpal tunnel repair 2014   Headache(784.0)    Hyperlipidemia    Hypertension    Hypothyroidism    Medial meniscus tear    PONV (postoperative nausea and vomiting)    Sleep apnea    cpap since 10   Past Surgical History:  Procedure Laterality Date   BILATERAL TOTAL MASTECTOMY WITH AXILLARY LYMPH NODE DISSECTION     BREAST RECONSTRUCTION     CERVICAL DISC ARTHROPLASTY N/A 03/19/2013   Procedure: CERVICAL FIVE TO SIX, CERVICAL SIX TO SEVEN CERVICAL ANTERIOR DISC ARTHROPLASTY;  Surgeon: Barnett Abu, MD;  Location: [facility];  Service: Neurosurgery;  Laterality: N/A;  C56 C67 artificial disc replacement   CESAREAN SECTION     COLONOSCOPY     DILATATION & CURETTAGE/HYSTEROSCOPY WITH TRUECLEAR N/A 11/25/2013   Procedure: DILATATION & CURETTAGE/HYSTEROSCOPY WITH TRUCLEAR;  Surgeon: Meriel Pica, MD;  Location: [facility];  Service: Gynecology;  Laterality: N/A;   ECTOPIC PREGNANCY SURGERY     GALLBLADDER SURGERY     MOUTH SURGERY      child- dog bite   SPINAL FUSION     2023   STERIOD INJECTION Left 09/09/2022   Procedure: LEFT KNEE STEROID INJECTION;  Surgeon: Ollen Gross, MD;  Location: [facility];  Service: Orthopedics;  Laterality: Left;  left knee injection 0928   TONSILLECTOMY     TOTAL KNEE ARTHROPLASTY Right 09/09/2022   Procedure: TOTAL KNEE ARTHROPLASTY;  Surgeon: Ollen Gross, MD;  Location: [facility];  Service: Orthopedics;  Laterality: Right;   TUBAL LIGATION     Patient Active Problem List   Diagnosis Date Noted   OA (osteoarthritis) of knee 09/09/2022   Exertional dyspnea 04/17/2022   Elevated coronary artery calcium score 04/17/2022   Spondylolisthesis at L4-L5 level 12/27/2021   Ductal carcinoma in situ (DCIS) of left breast 01/19/2018   Generalized anxiety disorder 11/07/2017   Depression 11/07/2017   Insomnia 11/07/2017   Cervical spondylosis 03/19/2013    PCP: Creola Corn  REFERRING PROVIDER: Trudee Grip  REFERRING DIAG: R TKA 09/09/22  THERAPY DIAG:  S/P total knee arthroplasty, right  Acute pain of right knee  Stiffness of right knee, not elsewhere classified  Muscle weakness (generalized)  Other abnormalities of gait and mobility  Localized edema  Rationale for Evaluation and Treatment: Rehabilitation  ONSET DATE: 09/09/22  SUBJECTIVE:  SUBJECTIVE STATEMENT:  Still having pain, woke me up last night. I did the bike yesterday for 10 mins partial ROM.   PERTINENT HISTORY: R TKA 8/19 L knee steroid injection 09/09/22  PAIN:  Are you having pain? Yes: NPRS scale: 5.5/10 Pain location: R knee Pain description: sharp medial R knee  Aggravating factors: weight bearing on the RLE, bending, moving it  Relieving factors: ice helps some, pain meds some but I don't think it is enough  PRECAUTIONS: None  RED FLAGS: None   WEIGHT BEARING RESTRICTIONS: No  FALLS:  Has patient fallen in last 6 months? No  LIVING ENVIRONMENT: Lives with: lives with their family and lives  with their spouse Lives in: House/apartment Stairs: Yes: External: 3 steps; none Has following equipment at home: Single point cane and Walker - 2 wheeled  OCCUPATION: Not working  PLOF: Independent  PATIENT GOALS: No pain, to keep the knee moving  NEXT MD VISIT: 09/24/22  OBJECTIVE:   PATIENT SURVEYS:  FOTO 4  COGNITION: Overall cognitive status: Within functional limits for tasks assessed      EDEMA:  Increased swelling surrounding R knee  POSTURE: rounded shoulders and flexed trunk   PALPATION: Warm and TTP  LOWER EXTREMITY ROM:  Active ROM Right eval Left eval Right 09/24/22 Right 09/26/22 Right 10/02/22  Hip flexion       Hip extension       Hip abduction       Hip adduction       Hip internal rotation       Hip external rotation       Knee flexion  60  75* AROM, 86 PROM 82* AROM seated per pt request  78* self AAROM, 76* AROM   Knee extension -30  -15* AROM seated, -15 PROM supine   -16* supine heel prop with quad set   Ankle dorsiflexion       Ankle plantarflexion       Ankle inversion       Ankle eversion        (Blank rows = not tested)  LOWER EXTREMITY MMT:  MMT Right eval Left eval  Hip flexion 2   Hip extension    Hip abduction    Hip adduction    Hip internal rotation    Hip external rotation    Knee flexion 2   Knee extension 2   Ankle dorsiflexion    Ankle plantarflexion    Ankle inversion    Ankle eversion     (Blank rows = not tested)   FUNCTIONAL TESTS:  5 times sit to stand: unable to do  Timed up and go (TUG): 1 min 28s  GAIT: Distance walked: in clinic distances Assistive device utilized: Environmental consultant - 2 wheeled Level of assistance: SBA Comments: antalgic gait, slowed cadence, step to pattern, decreased step length and time on RLE    TODAY'S TREATMENT:  DATE:  10/09/22 Reassess goals  NuStep L5x58mins   PROM flex/ext x5  Leg ext 5# x10, eccentric lowering on RLE x10 HS curls 20# x10, 10# x10 RLE  Gastroc stretch 30s Vaso to L knee med pressure 10 mins   10/07/22 Nustep level 4 x 6 minutes Leg press 20#, no weight , working on ROM Leg ext 5# 3x10 Leg curls 20# 2x10, right only 10# 2x10 PROM, contract relax, STM to the thigh, hip and scar to get her to relax and allow the PROM, gentle joint distraction and oscillations, some joint mobs, at the end of the session I measured AROM of the right knee to 85 degrees flexion  10/04/22 PROM, gentle distractions, gentle STM and scar mobs Some contract relax to hlp with ROM Nustep level 5 x 6 minutes 20# leg curls 2x10 Leg press 20# Leg press no weight right leg only 2x10 SAQ 2x10 a lot of cues to use the quad and not the hip flexor Feet on ball K2C, bridges Went back at the end and did more PROM and educated on the chair scoot for ROM of flexion  10/02/22 TherEx  Scifit bike seat 9-->8 for ROM partial rotations x8 minutes  Knee AAROM flexion 12x5 second holds R LE  HS curls red TB x10  Standing TKE x20 with red TB cues to activate quad instead of glute SLRs + quad set x20 R LE   Manual  Foam roller R quad above incision line  Seated knee flexion overpressure x10    09/30/22  TherEx  Reviewed good transfer mechanics/hand and foot placement and sequencing for STS transfers SciFit bike seat 8 x8 minutes partial rotations  Gastroc stretches 2x30 seconds R LE  HS 2x30 seconds R LE     Manual  Knee extension overpressure in supine  Foam roller R quad above incision Knee flexion overpressure in supine and seated    09/26/22  TherEx  SciFit bike seat 8 x8 minutes partial rotations for ROM SLRs + quad set x15 R LE  SLRs + ER with quad set x10 R LE  HS 3x30 seconds R LE  Bridges 3x5 with progressive knee flexion each round Seated knee self flexion AAROM from high mat table x12 with 5 second holds Attempted standing HS  stretches on steps unable to tolerate due to medial R knee pain Forward step ups 4 inch step x12 leading with R LE  Gait training with focus on heel-toe pattern and equal step lengths/stance times   Manual  Knee extension overpressure x10 supine     09/24/22  Nustep x8 minutes L1 with UEs focus on ROM LAQ 2# x10 with 2 second holds  Knee overpressure extension stretch 2# x4 minutes  SLR + quad set x12 0# Forward step ups 4 inch step BUE support HS curls R LE x10 0#  Manual  Knee flexion OP seated to tolerance      09/20/22  Nustep L 4 6 min PROM flex and ext with end range stretching AROM seated  7-  85 PROM seated 5- 90 PERFORMED AND ISSUED HEP Step tap 4 inch 2 sets 10- assistance to prevent hip hiking Standing with RW hip flex marching and HS curl 2 sets 10 After ex act flexion 92  09/17/22  TherEx  Nustep with UEs L1 x8 minutes focus on knee ROM Quad sets x12 with 3 second holds  SAQs with bolster from PT 1x10  SLRs MinA from PT x10 Knee flexion PROM seated to  tolerance x10  Education about WBAT, general recovery from TKR and exercise form, recommended use of towel bolster to cut down on LE ER in supine when trying to sleep    EVAL- 09/12/22    PATIENT EDUCATION:  Education details: POC and HEP Person educated: Patient Education method: Explanation Education comprehension: verbalized understanding  HOME EXERCISE PROGRAM: Access Code: [id number]  ASSESSMENT:  CLINICAL IMPRESSION:  Patient continues to be very stiff, tight and guarded. With progress note, she has made some good progress with her goals although she feels as if she is behind. Does have ROM deficits and pain. Continued with strengthening and working on getting her into further ranges especially with flexion. Still has lots of swelling.   OBJECTIVE IMPAIRMENTS: Abnormal gait, difficulty walking, decreased ROM, decreased strength, and pain.   ACTIVITY LIMITATIONS: bending, sitting,  standing, squatting, sleeping, stairs, transfers, bathing, toileting, and locomotion level  PARTICIPATION LIMITATIONS: meal prep, cleaning, laundry, driving, shopping, and community activity  REHAB POTENTIAL: Good  CLINICAL DECISION MAKING: Stable/uncomplicated  EVALUATION COMPLEXITY: Low   GOALS: Goals reviewed with patient? Yes  SHORT TERM GOALS: Target date: 10/24/22  Patient will be independent with initial HEP. Goal status: 09/20/22 met  2.  Patient will be do TUG < 30s Baseline: 1 min 28s  Goal status: ongoing 10/07/22, 18.57s MET 10/09/22  3.  Patient will be able to do 5 sit to stands  Baseline: unable to do  Goal status: progressing 10/04/22, MET 10/09/22   LONG TERM GOALS: Target date: 12/05/22  Patient will be independent with advanced/ongoing HEP to improve outcomes and carryover.  Goal status: INITIAL  2.  Patient will report at least 75% improvement in R knee pain to improve QOL. Baseline: 10/10 Goal status: IN PROGRESS   3.  Patient will demonstrate improved R knee AROM to >/= 0-120 deg to allow for normal gait and stair mechanics. Baseline: 30-0-60 10/09/22: 16-0-78  Goal status: IN PROGRESS  4.  Patient will demonstrate improved functional LE strength as demonstrated by 5/5. Baseline: 2/5 Goal status: IN PROGRESS 3+/5 10/09/22  5.  Patient will be able to ambulate 500' with LRAD and normal gait pattern without increased pain to access community.  Baseline: using RW small in home ambulation distances Goal status: IN PROGRESS 10/09/22  6.  Patient will report 66 on FOTO (patient reported outcome measure) to demonstrate improved functional ability. Baseline: 4 Goal status: INITIAL  PLAN:  PT FREQUENCY: 2x/week  PT DURATION: 12 weeks  PLANNED INTERVENTIONS: Therapeutic exercises, Therapeutic activity, Neuromuscular re-education, Balance training, Gait training, Patient/Family education, Self Care, Joint mobilization, Stair training, Electrical  stimulation, Cryotherapy, Moist heat, scar mobilization, Vasopneumatic device, and Manual therapy  PLAN FOR NEXT SESSION: really encourage the bending and function, get her to use the mms more, may try walking more  Cassie Freer, PT, DPT  10/09/22 5:03 PM    [place]  [facility] at [facility]. [place], [place], [address] Phone: [phone]   Fax:  [phone]

## 2022-10-09 ENCOUNTER — Ambulatory Visit: Payer: No Typology Code available for payment source

## 2022-10-09 ENCOUNTER — Ambulatory Visit: Payer: No Typology Code available for payment source | Admitting: Professional Counselor

## 2022-10-09 DIAGNOSIS — M25661 Stiffness of right knee, not elsewhere classified: Secondary | ICD-10-CM

## 2022-10-09 DIAGNOSIS — R600 Localized edema: Secondary | ICD-10-CM

## 2022-10-09 DIAGNOSIS — Z96651 Presence of right artificial knee joint: Secondary | ICD-10-CM | POA: Diagnosis not present

## 2022-10-09 DIAGNOSIS — M6281 Muscle weakness (generalized): Secondary | ICD-10-CM

## 2022-10-09 DIAGNOSIS — M25561 Pain in right knee: Secondary | ICD-10-CM

## 2022-10-09 DIAGNOSIS — R2689 Other abnormalities of gait and mobility: Secondary | ICD-10-CM

## 2022-10-11 ENCOUNTER — Ambulatory Visit: Payer: No Typology Code available for payment source | Admitting: Physical Therapy

## 2022-10-11 DIAGNOSIS — M25661 Stiffness of right knee, not elsewhere classified: Secondary | ICD-10-CM

## 2022-10-11 DIAGNOSIS — Z96651 Presence of right artificial knee joint: Secondary | ICD-10-CM

## 2022-10-11 DIAGNOSIS — M6281 Muscle weakness (generalized): Secondary | ICD-10-CM

## 2022-10-11 DIAGNOSIS — M25561 Pain in right knee: Secondary | ICD-10-CM

## 2022-10-11 NOTE — Therapy (Signed)
OUTPATIENT PHYSICAL THERAPY LOWER EXTREMITY TREATMENT    Patient Name: [name] MRN: [id number] DOB:[birth year], 62 y.o., female Today's Date: 10/11/2022  END OF SESSION:  PT End of Session - 10/11/22 1102     Visit Number 11    Date for PT Re-Evaluation 12/05/22    Authorization Type UHC    PT Start Time 1100    PT Stop Time 1150    PT Time Calculation (min) 50 min                   Past Medical History:  Diagnosis Date   Arthritis    Back pain of lumbar region with sciatica    Cancer (HCC)    breast   Depression    Diabetes mellitus, type II (HCC)    Family history of anesthesia complication    grandfather died under anesthesia 70's- had heart issues   H/O carpal tunnel repair 2014   Headache(784.0)    Hyperlipidemia    Hypertension    Hypothyroidism    Medial meniscus tear    PONV (postoperative nausea and vomiting)    Sleep apnea    cpap since 10   Past Surgical History:  Procedure Laterality Date   BILATERAL TOTAL MASTECTOMY WITH AXILLARY LYMPH NODE DISSECTION     BREAST RECONSTRUCTION     CERVICAL DISC ARTHROPLASTY N/A 03/19/2013   Procedure: CERVICAL FIVE TO SIX, CERVICAL SIX TO SEVEN CERVICAL ANTERIOR DISC ARTHROPLASTY;  Surgeon: Barnett Abu, MD;  Location: [facility];  Service: Neurosurgery;  Laterality: N/A;  C56 C67 artificial disc replacement   CESAREAN SECTION     COLONOSCOPY     DILATATION & CURETTAGE/HYSTEROSCOPY WITH TRUECLEAR N/A 11/25/2013   Procedure: DILATATION & CURETTAGE/HYSTEROSCOPY WITH TRUCLEAR;  Surgeon: Meriel Pica, MD;  Location: [facility];  Service: Gynecology;  Laterality: N/A;   ECTOPIC PREGNANCY SURGERY     GALLBLADDER SURGERY     MOUTH SURGERY     child- dog bite   SPINAL FUSION     2023   STERIOD INJECTION Left 09/09/2022   Procedure: LEFT KNEE STEROID INJECTION;  Surgeon: Ollen Gross, MD;  Location: [facility];  Service: Orthopedics;  Laterality: Left;  left knee injection 0928   TONSILLECTOMY     TOTAL  KNEE ARTHROPLASTY Right 09/09/2022   Procedure: TOTAL KNEE ARTHROPLASTY;  Surgeon: Ollen Gross, MD;  Location: [facility];  Service: Orthopedics;  Laterality: Right;   TUBAL LIGATION     Patient Active Problem List   Diagnosis Date Noted   OA (osteoarthritis) of knee 09/09/2022   Exertional dyspnea 04/17/2022   Elevated coronary artery calcium score 04/17/2022   Spondylolisthesis at L4-L5 level 12/27/2021   Ductal carcinoma in situ (DCIS) of left breast 01/19/2018   Generalized anxiety disorder 11/07/2017   Depression 11/07/2017   Insomnia 11/07/2017   Cervical spondylosis 03/19/2013    PCP: Creola Corn  REFERRING PROVIDER: Trudee Grip  REFERRING DIAG: R TKA 09/09/22  THERAPY DIAG:  S/P total knee arthroplasty, right  Acute pain of right knee  Stiffness of right knee, not elsewhere classified  Muscle weakness (generalized)  Rationale for Evaluation and Treatment: Rehabilitation  ONSET DATE: 09/09/22  SUBJECTIVE:   SUBJECTIVE STATEMENT seeing surgeon Tuesday  PERTINENT HISTORY: R TKA 8/19 L knee steroid injection 09/09/22  PAIN:  Are you having pain? Yes: NPRS scale: 5.5/10 Pain location: R knee Pain description: sharp medial R knee  Aggravating factors: weight bearing on the RLE, bending, moving it  Relieving  factors: ice helps some, pain meds some but I don't think it is enough  PRECAUTIONS: None  RED FLAGS: None   WEIGHT BEARING RESTRICTIONS: No  FALLS:  Has patient fallen in last 6 months? No  LIVING ENVIRONMENT: Lives with: lives with their family and lives with their spouse Lives in: House/apartment Stairs: Yes: External: 3 steps; none Has following equipment at home: Single point cane and Walker - 2 wheeled  OCCUPATION: Not working  PLOF: Independent  PATIENT GOALS: No pain, to keep the knee moving  NEXT MD VISIT: 09/24/22  OBJECTIVE:   PATIENT SURVEYS:  FOTO 4  COGNITION: Overall cognitive status: Within functional limits for tasks  assessed      EDEMA:  Increased swelling surrounding R knee  POSTURE: rounded shoulders and flexed trunk   PALPATION: Warm and TTP  LOWER EXTREMITY ROM:  Active ROM Right eval Left eval Right 09/24/22 Right 09/26/22 Right 10/02/22  Hip flexion       Hip extension       Hip abduction       Hip adduction       Hip internal rotation       Hip external rotation       Knee flexion  60  75* AROM, 86 PROM 82* AROM seated per pt request  78* self AAROM, 76* AROM   Knee extension -30  -15* AROM seated, -15 PROM supine   -16* supine heel prop with quad set   Ankle dorsiflexion       Ankle plantarflexion       Ankle inversion       Ankle eversion        (Blank rows = not tested)  LOWER EXTREMITY MMT:  MMT Right eval Left eval  Hip flexion 2   Hip extension    Hip abduction    Hip adduction    Hip internal rotation    Hip external rotation    Knee flexion 2   Knee extension 2   Ankle dorsiflexion    Ankle plantarflexion    Ankle inversion    Ankle eversion     (Blank rows = not tested)   FUNCTIONAL TESTS:  5 times sit to stand: unable to do  Timed up and go (TUG): 1 min 28s  GAIT: Distance walked: in clinic distances Assistive device utilized: Environmental consultant - 2 wheeled Level of assistance: SBA Comments: antalgic gait, slowed cadence, step to pattern, decreased step length and time on RLE    TODAY'S TREATMENT:                                                                                                                              DATE:   10/11/22 PROM,CR,belt mobs to increase flexion  ROM sitting Act 79   Pass 85- 20 min Nustep L 5 increasing flexion Step stretch with over pressure and instructed for home HS curl 10# RT LE 2 sets 10 Leg  Press RT LE unlocked for ROM 2 sets 10 VASO 10 min        10/09/22 Reassess goals  NuStep L5x67mins  PROM flex/ext x5  Leg ext 5# x10, eccentric lowering on RLE x10 HS curls 20# x10, 10# x10 RLE  Gastroc stretch  30s Vaso to L knee med pressure 10 mins   10/07/22 Nustep level 4 x 6 minutes Leg press 20#, no weight , working on ROM Leg ext 5# 3x10 Leg curls 20# 2x10, right only 10# 2x10 PROM, contract relax, STM to the thigh, hip and scar to get her to relax and allow the PROM, gentle joint distraction and oscillations, some joint mobs, at the end of the session I measured AROM of the right knee to 85 degrees flexion  10/04/22 PROM, gentle distractions, gentle STM and scar mobs Some contract relax to hlp with ROM Nustep level 5 x 6 minutes 20# leg curls 2x10 Leg press 20# Leg press no weight right leg only 2x10 SAQ 2x10 a lot of cues to use the quad and not the hip flexor Feet on ball K2C, bridges Went back at the end and did more PROM and educated on the chair scoot for ROM of flexion  10/02/22 TherEx  Scifit bike seat 9-->8 for ROM partial rotations x8 minutes  Knee AAROM flexion 12x5 second holds R LE  HS curls red TB x10  Standing TKE x20 with red TB cues to activate quad instead of glute SLRs + quad set x20 R LE   Manual  Foam roller R quad above incision line  Seated knee flexion overpressure x10    09/30/22  TherEx  Reviewed good transfer mechanics/hand and foot placement and sequencing for STS transfers SciFit bike seat 8 x8 minutes partial rotations  Gastroc stretches 2x30 seconds R LE  HS 2x30 seconds R LE     Manual  Knee extension overpressure in supine  Foam roller R quad above incision Knee flexion overpressure in supine and seated    09/26/22  TherEx  SciFit bike seat 8 x8 minutes partial rotations for ROM SLRs + quad set x15 R LE  SLRs + ER with quad set x10 R LE  HS 3x30 seconds R LE  Bridges 3x5 with progressive knee flexion each round Seated knee self flexion AAROM from high mat table x12 with 5 second holds Attempted standing HS stretches on steps unable to tolerate due to medial R knee pain Forward step ups 4 inch step x12 leading with R LE   Gait training with focus on heel-toe pattern and equal step lengths/stance times   Manual  Knee extension overpressure x10 supine     09/24/22  Nustep x8 minutes L1 with UEs focus on ROM LAQ 2# x10 with 2 second holds  Knee overpressure extension stretch 2# x4 minutes  SLR + quad set x12 0# Forward step ups 4 inch step BUE support HS curls R LE x10 0#  Manual  Knee flexion OP seated to tolerance      09/20/22  Nustep L 4 6 min PROM flex and ext with end range stretching AROM seated  7-  85 PROM seated 5- 90 PERFORMED AND ISSUED HEP Step tap 4 inch 2 sets 10- assistance to prevent hip hiking Standing with RW hip flex marching and HS curl 2 sets 10 After ex act flexion 92  09/17/22  TherEx  Nustep with UEs L1 x8 minutes focus on knee ROM Quad sets x12 with 3 second holds  SAQs with bolster from PT 1x10  SLRs MinA from PT x10 Knee flexion PROM seated to tolerance x10  Education about WBAT, general recovery from TKR and exercise form, recommended use of towel bolster to cut down on LE ER in supine when trying to sleep    EVAL- 09/12/22    PATIENT EDUCATION:  Education details: POC and HEP Person educated: Patient Education method: Explanation Education comprehension: verbalized understanding  HOME EXERCISE PROGRAM: Access Code: [id number]  ASSESSMENT:  CLINICAL IMPRESSION:  Patient continues to be very stiff, tight and guarded. ROM taken after 20 min of MT and still very limited. Pt is guarded and resists mvmt. Sees surgeon Tuesday and we discussed possible manipulation. OBJECTIVE IMPAIRMENTS: Abnormal gait, difficulty walking, decreased ROM, decreased strength, and pain.   ACTIVITY LIMITATIONS: bending, sitting, standing, squatting, sleeping, stairs, transfers, bathing, toileting, and locomotion level  PARTICIPATION LIMITATIONS: meal prep, cleaning, laundry, driving, shopping, and community activity  REHAB POTENTIAL: Good  CLINICAL DECISION MAKING:  Stable/uncomplicated  EVALUATION COMPLEXITY: Low   GOALS: Goals reviewed with patient? Yes  SHORT TERM GOALS: Target date: 10/24/22  Patient will be independent with initial HEP. Goal status: 09/20/22 met  2.  Patient will be do TUG < 30s Baseline: 1 min 28s  Goal status: ongoing 10/07/22, 18.57s MET 10/09/22  3.  Patient will be able to do 5 sit to stands  Baseline: unable to do  Goal status: progressing 10/04/22, MET 10/09/22   LONG TERM GOALS: Target date: 12/05/22  Patient will be independent with advanced/ongoing HEP to improve outcomes and carryover.  Goal status: INITIAL  2.  Patient will report at least 75% improvement in R knee pain to improve QOL. Baseline: 10/10 Goal status: IN PROGRESS   3.  Patient will demonstrate improved R knee AROM to >/= 0-120 deg to allow for normal gait and stair mechanics. Baseline: 30-0-60 10/09/22: 16-0-78  Goal status: IN PROGRESS  4.  Patient will demonstrate improved functional LE strength as demonstrated by 5/5. Baseline: 2/5 Goal status: IN PROGRESS 3+/5 10/09/22  5.  Patient will be able to ambulate 500' with LRAD and normal gait pattern without increased pain to access community.  Baseline: using RW small in home ambulation distances Goal status: IN PROGRESS 10/09/22  6.  Patient will report 37 on FOTO (patient reported outcome measure) to demonstrate improved functional ability. Baseline: 4 Goal status: INITIAL  PLAN:  PT FREQUENCY: 2x/week  PT DURATION: 12 weeks  PLANNED INTERVENTIONS: Therapeutic exercises, Therapeutic activity, Neuromuscular re-education, Balance training, Gait training, Patient/Family education, Self Care, Joint mobilization, Stair training, Electrical stimulation, Cryotherapy, Moist heat, scar mobilization, Vasopneumatic device, and Manual therapy  PLAN FOR NEXT SESSION: WRITE MD NOTE      Patient Details  Name: AYLEIGH CUADRA MRN: [id number] Date of Birth: 06/17/1960 Referring Provider:   Ollen Gross, MD  Encounter Date: 10/11/2022   Suanne Marker, PTA 10/11/2022, 11:03 AM  [place] [facility] at [facility] [address] [place], [place], [address] Phone: [phone]   Fax:  [phone]

## 2022-10-14 ENCOUNTER — Encounter: Payer: Self-pay | Admitting: Physical Therapy

## 2022-10-14 ENCOUNTER — Ambulatory Visit: Payer: No Typology Code available for payment source | Admitting: Physical Therapy

## 2022-10-14 DIAGNOSIS — Z96651 Presence of right artificial knee joint: Secondary | ICD-10-CM | POA: Diagnosis not present

## 2022-10-14 DIAGNOSIS — M25661 Stiffness of right knee, not elsewhere classified: Secondary | ICD-10-CM

## 2022-10-14 DIAGNOSIS — R600 Localized edema: Secondary | ICD-10-CM

## 2022-10-14 DIAGNOSIS — M25561 Pain in right knee: Secondary | ICD-10-CM

## 2022-10-14 DIAGNOSIS — M6281 Muscle weakness (generalized): Secondary | ICD-10-CM

## 2022-10-14 DIAGNOSIS — R2689 Other abnormalities of gait and mobility: Secondary | ICD-10-CM

## 2022-10-14 NOTE — Therapy (Signed)
with end range stretching AROM seated  7-  85 PROM seated 5- 90 PERFORMED AND ISSUED HEP Step tap 4 inch 2 sets 10- assistance to prevent hip hiking Standing with RW hip flex marching and HS curl 2 sets 10 After ex act flexion 92  09/17/22  TherEx  Nustep with UEs L1 x8 minutes focus on knee ROM Quad sets x12 with 3 second holds  SAQs with bolster from PT 1x10  SLRs MinA from PT x10 Knee flexion PROM seated to tolerance x10  Education about WBAT, general recovery from TKR and exercise form, recommended use of towel bolster to cut down on LE ER in supine when trying to sleep    EVAL- 09/12/22    PATIENT EDUCATION:  Education details: POC and HEP Person educated: Patient Education method: Explanation Education comprehension: verbalized understanding  HOME EXERCISE PROGRAM: Access Code: [id number]  ASSESSMENT:  CLINICAL IMPRESSION:  Patient continues to be very stiff, tight and  guarded.She comes in with very minimal ROM very stiff and no knee bend with walking, after a lot of ROM and passive stretching I can get her some ROM, but again very guarded in the hip and the knee and tries to avoid the bend OBJECTIVE IMPAIRMENTS: Abnormal gait, difficulty walking, decreased ROM, decreased strength, and pain.   ACTIVITY LIMITATIONS: bending, sitting, standing, squatting, sleeping, stairs, transfers, bathing, toileting, and locomotion level  PARTICIPATION LIMITATIONS: meal prep, cleaning, laundry, driving, shopping, and community activity  REHAB POTENTIAL: Good  CLINICAL DECISION MAKING: Stable/uncomplicated  EVALUATION COMPLEXITY: Low   GOALS: Goals reviewed with patient? Yes  SHORT TERM GOALS: Target date: 10/24/22  Patient will be independent with initial HEP. Goal status: 09/20/22 met  2.  Patient will be do TUG < 30s Baseline: 1 min 28s  Goal status: ongoing 10/07/22, 18.57s MET 10/09/22  3.  Patient will be able to do 5 sit to stands  Baseline: unable to do  Goal status: progressing 10/04/22, MET 10/09/22   LONG TERM GOALS: Target date: 12/05/22  Patient will be independent with advanced/ongoing HEP to improve outcomes and carryover.  Goal status: INITIAL  2.  Patient will report at least 75% improvement in R knee pain to improve QOL. Baseline: 10/10 Goal status: IN PROGRESS   3.  Patient will demonstrate improved R knee AROM to >/= 0-120 deg to allow for normal gait and stair mechanics. Baseline: 30-0-60 10/09/22: 16-0-78  Goal status: IN PROGRESS  4.  Patient will demonstrate improved functional LE strength as demonstrated by 5/5. Baseline: 2/5 Goal status: IN PROGRESS 3+/5 10/09/22  5.  Patient will be able to ambulate 500' with LRAD and normal gait pattern without increased pain to access community.  Baseline: using RW small in home ambulation distances Goal status: IN PROGRESS 10/09/22  6.  Patient will report 69 on FOTO (patient reported  outcome measure) to demonstrate improved functional ability. Baseline: 4 Goal status: INITIAL  PLAN:  PT FREQUENCY: 2x/week  PT DURATION: 12 weeks  PLANNED INTERVENTIONS: Therapeutic exercises, Therapeutic activity, Neuromuscular re-education, Balance training, Gait training, Patient/Family education, Self Care, Joint mobilization, Stair training, Electrical stimulation, Cryotherapy, Moist heat, scar mobilization, Vasopneumatic device, and Manual therapy  PLAN FOR NEXT SESSION: see what the MD says    Stacie Glaze, PT  Patient Details  Name: [name] MRN: [id number] Date of Birth: Sep 10, 1960 Referring Provider:  Ollen Gross, MD  Encounter Date: 10/14/2022   Jearld Lesch, PT 10/14/2022, 4:23 PM  [place]  [facility]  with end range stretching AROM seated  7-  85 PROM seated 5- 90 PERFORMED AND ISSUED HEP Step tap 4 inch 2 sets 10- assistance to prevent hip hiking Standing with RW hip flex marching and HS curl 2 sets 10 After ex act flexion 92  09/17/22  TherEx  Nustep with UEs L1 x8 minutes focus on knee ROM Quad sets x12 with 3 second holds  SAQs with bolster from PT 1x10  SLRs MinA from PT x10 Knee flexion PROM seated to tolerance x10  Education about WBAT, general recovery from TKR and exercise form, recommended use of towel bolster to cut down on LE ER in supine when trying to sleep    EVAL- 09/12/22    PATIENT EDUCATION:  Education details: POC and HEP Person educated: Patient Education method: Explanation Education comprehension: verbalized understanding  HOME EXERCISE PROGRAM: Access Code: [id number]  ASSESSMENT:  CLINICAL IMPRESSION:  Patient continues to be very stiff, tight and  guarded.She comes in with very minimal ROM very stiff and no knee bend with walking, after a lot of ROM and passive stretching I can get her some ROM, but again very guarded in the hip and the knee and tries to avoid the bend OBJECTIVE IMPAIRMENTS: Abnormal gait, difficulty walking, decreased ROM, decreased strength, and pain.   ACTIVITY LIMITATIONS: bending, sitting, standing, squatting, sleeping, stairs, transfers, bathing, toileting, and locomotion level  PARTICIPATION LIMITATIONS: meal prep, cleaning, laundry, driving, shopping, and community activity  REHAB POTENTIAL: Good  CLINICAL DECISION MAKING: Stable/uncomplicated  EVALUATION COMPLEXITY: Low   GOALS: Goals reviewed with patient? Yes  SHORT TERM GOALS: Target date: 10/24/22  Patient will be independent with initial HEP. Goal status: 09/20/22 met  2.  Patient will be do TUG < 30s Baseline: 1 min 28s  Goal status: ongoing 10/07/22, 18.57s MET 10/09/22  3.  Patient will be able to do 5 sit to stands  Baseline: unable to do  Goal status: progressing 10/04/22, MET 10/09/22   LONG TERM GOALS: Target date: 12/05/22  Patient will be independent with advanced/ongoing HEP to improve outcomes and carryover.  Goal status: INITIAL  2.  Patient will report at least 75% improvement in R knee pain to improve QOL. Baseline: 10/10 Goal status: IN PROGRESS   3.  Patient will demonstrate improved R knee AROM to >/= 0-120 deg to allow for normal gait and stair mechanics. Baseline: 30-0-60 10/09/22: 16-0-78  Goal status: IN PROGRESS  4.  Patient will demonstrate improved functional LE strength as demonstrated by 5/5. Baseline: 2/5 Goal status: IN PROGRESS 3+/5 10/09/22  5.  Patient will be able to ambulate 500' with LRAD and normal gait pattern without increased pain to access community.  Baseline: using RW small in home ambulation distances Goal status: IN PROGRESS 10/09/22  6.  Patient will report 69 on FOTO (patient reported  outcome measure) to demonstrate improved functional ability. Baseline: 4 Goal status: INITIAL  PLAN:  PT FREQUENCY: 2x/week  PT DURATION: 12 weeks  PLANNED INTERVENTIONS: Therapeutic exercises, Therapeutic activity, Neuromuscular re-education, Balance training, Gait training, Patient/Family education, Self Care, Joint mobilization, Stair training, Electrical stimulation, Cryotherapy, Moist heat, scar mobilization, Vasopneumatic device, and Manual therapy  PLAN FOR NEXT SESSION: see what the MD says    Stacie Glaze, PT  Patient Details  Name: [name] MRN: [id number] Date of Birth: Sep 10, 1960 Referring Provider:  Ollen Gross, MD  Encounter Date: 10/14/2022   Jearld Lesch, PT 10/14/2022, 4:23 PM  [place]  [facility]  with end range stretching AROM seated  7-  85 PROM seated 5- 90 PERFORMED AND ISSUED HEP Step tap 4 inch 2 sets 10- assistance to prevent hip hiking Standing with RW hip flex marching and HS curl 2 sets 10 After ex act flexion 92  09/17/22  TherEx  Nustep with UEs L1 x8 minutes focus on knee ROM Quad sets x12 with 3 second holds  SAQs with bolster from PT 1x10  SLRs MinA from PT x10 Knee flexion PROM seated to tolerance x10  Education about WBAT, general recovery from TKR and exercise form, recommended use of towel bolster to cut down on LE ER in supine when trying to sleep    EVAL- 09/12/22    PATIENT EDUCATION:  Education details: POC and HEP Person educated: Patient Education method: Explanation Education comprehension: verbalized understanding  HOME EXERCISE PROGRAM: Access Code: [id number]  ASSESSMENT:  CLINICAL IMPRESSION:  Patient continues to be very stiff, tight and  guarded.She comes in with very minimal ROM very stiff and no knee bend with walking, after a lot of ROM and passive stretching I can get her some ROM, but again very guarded in the hip and the knee and tries to avoid the bend OBJECTIVE IMPAIRMENTS: Abnormal gait, difficulty walking, decreased ROM, decreased strength, and pain.   ACTIVITY LIMITATIONS: bending, sitting, standing, squatting, sleeping, stairs, transfers, bathing, toileting, and locomotion level  PARTICIPATION LIMITATIONS: meal prep, cleaning, laundry, driving, shopping, and community activity  REHAB POTENTIAL: Good  CLINICAL DECISION MAKING: Stable/uncomplicated  EVALUATION COMPLEXITY: Low   GOALS: Goals reviewed with patient? Yes  SHORT TERM GOALS: Target date: 10/24/22  Patient will be independent with initial HEP. Goal status: 09/20/22 met  2.  Patient will be do TUG < 30s Baseline: 1 min 28s  Goal status: ongoing 10/07/22, 18.57s MET 10/09/22  3.  Patient will be able to do 5 sit to stands  Baseline: unable to do  Goal status: progressing 10/04/22, MET 10/09/22   LONG TERM GOALS: Target date: 12/05/22  Patient will be independent with advanced/ongoing HEP to improve outcomes and carryover.  Goal status: INITIAL  2.  Patient will report at least 75% improvement in R knee pain to improve QOL. Baseline: 10/10 Goal status: IN PROGRESS   3.  Patient will demonstrate improved R knee AROM to >/= 0-120 deg to allow for normal gait and stair mechanics. Baseline: 30-0-60 10/09/22: 16-0-78  Goal status: IN PROGRESS  4.  Patient will demonstrate improved functional LE strength as demonstrated by 5/5. Baseline: 2/5 Goal status: IN PROGRESS 3+/5 10/09/22  5.  Patient will be able to ambulate 500' with LRAD and normal gait pattern without increased pain to access community.  Baseline: using RW small in home ambulation distances Goal status: IN PROGRESS 10/09/22  6.  Patient will report 69 on FOTO (patient reported  outcome measure) to demonstrate improved functional ability. Baseline: 4 Goal status: INITIAL  PLAN:  PT FREQUENCY: 2x/week  PT DURATION: 12 weeks  PLANNED INTERVENTIONS: Therapeutic exercises, Therapeutic activity, Neuromuscular re-education, Balance training, Gait training, Patient/Family education, Self Care, Joint mobilization, Stair training, Electrical stimulation, Cryotherapy, Moist heat, scar mobilization, Vasopneumatic device, and Manual therapy  PLAN FOR NEXT SESSION: see what the MD says    Stacie Glaze, PT  Patient Details  Name: [name] MRN: [id number] Date of Birth: Sep 10, 1960 Referring Provider:  Ollen Gross, MD  Encounter Date: 10/14/2022   Jearld Lesch, PT 10/14/2022, 4:23 PM  [place]  [facility]  with end range stretching AROM seated  7-  85 PROM seated 5- 90 PERFORMED AND ISSUED HEP Step tap 4 inch 2 sets 10- assistance to prevent hip hiking Standing with RW hip flex marching and HS curl 2 sets 10 After ex act flexion 92  09/17/22  TherEx  Nustep with UEs L1 x8 minutes focus on knee ROM Quad sets x12 with 3 second holds  SAQs with bolster from PT 1x10  SLRs MinA from PT x10 Knee flexion PROM seated to tolerance x10  Education about WBAT, general recovery from TKR and exercise form, recommended use of towel bolster to cut down on LE ER in supine when trying to sleep    EVAL- 09/12/22    PATIENT EDUCATION:  Education details: POC and HEP Person educated: Patient Education method: Explanation Education comprehension: verbalized understanding  HOME EXERCISE PROGRAM: Access Code: [id number]  ASSESSMENT:  CLINICAL IMPRESSION:  Patient continues to be very stiff, tight and  guarded.She comes in with very minimal ROM very stiff and no knee bend with walking, after a lot of ROM and passive stretching I can get her some ROM, but again very guarded in the hip and the knee and tries to avoid the bend OBJECTIVE IMPAIRMENTS: Abnormal gait, difficulty walking, decreased ROM, decreased strength, and pain.   ACTIVITY LIMITATIONS: bending, sitting, standing, squatting, sleeping, stairs, transfers, bathing, toileting, and locomotion level  PARTICIPATION LIMITATIONS: meal prep, cleaning, laundry, driving, shopping, and community activity  REHAB POTENTIAL: Good  CLINICAL DECISION MAKING: Stable/uncomplicated  EVALUATION COMPLEXITY: Low   GOALS: Goals reviewed with patient? Yes  SHORT TERM GOALS: Target date: 10/24/22  Patient will be independent with initial HEP. Goal status: 09/20/22 met  2.  Patient will be do TUG < 30s Baseline: 1 min 28s  Goal status: ongoing 10/07/22, 18.57s MET 10/09/22  3.  Patient will be able to do 5 sit to stands  Baseline: unable to do  Goal status: progressing 10/04/22, MET 10/09/22   LONG TERM GOALS: Target date: 12/05/22  Patient will be independent with advanced/ongoing HEP to improve outcomes and carryover.  Goal status: INITIAL  2.  Patient will report at least 75% improvement in R knee pain to improve QOL. Baseline: 10/10 Goal status: IN PROGRESS   3.  Patient will demonstrate improved R knee AROM to >/= 0-120 deg to allow for normal gait and stair mechanics. Baseline: 30-0-60 10/09/22: 16-0-78  Goal status: IN PROGRESS  4.  Patient will demonstrate improved functional LE strength as demonstrated by 5/5. Baseline: 2/5 Goal status: IN PROGRESS 3+/5 10/09/22  5.  Patient will be able to ambulate 500' with LRAD and normal gait pattern without increased pain to access community.  Baseline: using RW small in home ambulation distances Goal status: IN PROGRESS 10/09/22  6.  Patient will report 69 on FOTO (patient reported  outcome measure) to demonstrate improved functional ability. Baseline: 4 Goal status: INITIAL  PLAN:  PT FREQUENCY: 2x/week  PT DURATION: 12 weeks  PLANNED INTERVENTIONS: Therapeutic exercises, Therapeutic activity, Neuromuscular re-education, Balance training, Gait training, Patient/Family education, Self Care, Joint mobilization, Stair training, Electrical stimulation, Cryotherapy, Moist heat, scar mobilization, Vasopneumatic device, and Manual therapy  PLAN FOR NEXT SESSION: see what the MD says    Stacie Glaze, PT  Patient Details  Name: [name] MRN: [id number] Date of Birth: Sep 10, 1960 Referring Provider:  Ollen Gross, MD  Encounter Date: 10/14/2022   Jearld Lesch, PT 10/14/2022, 4:23 PM  [place]  [facility]  OUTPATIENT PHYSICAL THERAPY LOWER EXTREMITY TREATMENT    Patient Name: [name] MRN: [id number] DOB:[birth year], 62 y.o., female Today's Date: 10/14/2022  END OF SESSION:  PT End of Session - 10/14/22 1622     Visit Number 12    Date for PT Re-Evaluation 12/05/22    Authorization Type [employer]    PT Start Time 1615    PT Stop Time 1712    PT Time Calculation (min) 57 min    Activity Tolerance Patient limited by pain;Patient tolerated treatment well    Behavior During Therapy [facility] for tasks assessed/performed                   Past Medical History:  Diagnosis Date   Arthritis    Back pain of lumbar region with sciatica    Cancer (HCC)    breast   Depression    Diabetes mellitus, type II (HCC)    Family history of anesthesia complication    grandfather died under anesthesia 70's- had heart issues   H/O carpal tunnel repair 2014   Headache(784.0)    Hyperlipidemia    Hypertension    Hypothyroidism    Medial meniscus tear    PONV (postoperative nausea and vomiting)    Sleep apnea    cpap since 10   Past Surgical History:  Procedure Laterality Date   BILATERAL TOTAL MASTECTOMY WITH AXILLARY LYMPH NODE DISSECTION     BREAST RECONSTRUCTION     CERVICAL DISC ARTHROPLASTY N/A 03/19/2013   Procedure: CERVICAL FIVE TO SIX, CERVICAL SIX TO SEVEN CERVICAL ANTERIOR DISC ARTHROPLASTY;  Surgeon: Barnett Abu, MD;  Location: [facility];  Service: Neurosurgery;  Laterality: N/A;  C56 C67 artificial disc replacement   CESAREAN SECTION     COLONOSCOPY     DILATATION & CURETTAGE/HYSTEROSCOPY WITH TRUECLEAR N/A 11/25/2013   Procedure: DILATATION & CURETTAGE/HYSTEROSCOPY WITH TRUCLEAR;  Surgeon: Meriel Pica, MD;  Location: [facility];  Service: Gynecology;  Laterality: N/A;   ECTOPIC PREGNANCY SURGERY     GALLBLADDER SURGERY     MOUTH SURGERY     child- dog bite   SPINAL FUSION     2023   STERIOD INJECTION Left 09/09/2022   Procedure: LEFT KNEE STEROID INJECTION;   Surgeon: Ollen Gross, MD;  Location: [facility];  Service: Orthopedics;  Laterality: Left;  left knee injection 0928   TONSILLECTOMY     TOTAL KNEE ARTHROPLASTY Right 09/09/2022   Procedure: TOTAL KNEE ARTHROPLASTY;  Surgeon: Ollen Gross, MD;  Location: [facility];  Service: Orthopedics;  Laterality: Right;   TUBAL LIGATION     Patient Active Problem List   Diagnosis Date Noted   OA (osteoarthritis) of knee 09/09/2022   Exertional dyspnea 04/17/2022   Elevated coronary artery calcium score 04/17/2022   Spondylolisthesis at L4-L5 level 12/27/2021   Ductal carcinoma in situ (DCIS) of left breast 01/19/2018   Generalized anxiety disorder 11/07/2017   Depression 11/07/2017   Insomnia 11/07/2017   Cervical spondylosis 03/19/2013    PCP: Creola Corn  REFERRING PROVIDER: Trudee Grip  REFERRING DIAG: R TKA 09/09/22  THERAPY DIAG:  S/P total knee arthroplasty, right  Acute pain of right knee  Stiffness of right knee, not elsewhere classified  Muscle weakness (generalized)  Other abnormalities of gait and mobility  Localized edema  Rationale for Evaluation and Treatment: Rehabilitation  ONSET DATE: 09/09/22  SUBJECTIVE:   SUBJECTIVE STATEMENT : Patient comes in and reports that she has been stretching daily at home, she walks

## 2022-10-16 ENCOUNTER — Ambulatory Visit: Payer: No Typology Code available for payment source

## 2022-10-16 DIAGNOSIS — Z96651 Presence of right artificial knee joint: Secondary | ICD-10-CM

## 2022-10-16 DIAGNOSIS — M25561 Pain in right knee: Secondary | ICD-10-CM

## 2022-10-16 DIAGNOSIS — R600 Localized edema: Secondary | ICD-10-CM

## 2022-10-16 DIAGNOSIS — R2689 Other abnormalities of gait and mobility: Secondary | ICD-10-CM

## 2022-10-16 DIAGNOSIS — M25661 Stiffness of right knee, not elsewhere classified: Secondary | ICD-10-CM

## 2022-10-16 DIAGNOSIS — M6281 Muscle weakness (generalized): Secondary | ICD-10-CM

## 2022-10-16 NOTE — Therapy (Signed)
Stair training, Electrical stimulation, Cryotherapy, Moist heat, scar mobilization, Vasopneumatic device, and Manual therapy  PLAN FOR NEXT SESSION: see what the MD says    Stacie Glaze, PT  Patient Details  Name: [name] MRN: [id number] Date of Birth: [birth year] Referring Provider:  Ollen Gross, MD  Encounter Date: 10/16/2022   Cassie Freer, PT 10/16/2022, 5:07 PM  [facility] [address] [facility] [place], [place], [address] Phone: [phone]   Fax:  [phone]  OUTPATIENT PHYSICAL THERAPY LOWER EXTREMITY TREATMENT    Patient Name: [name] MRN: [id number] DOB:Aug 31, 1960, 62 y.o., female Today's Date: 10/16/2022  END OF SESSION:  PT End of Session - 10/16/22 1631     Visit Number 13    Date for PT Re-Evaluation 12/05/22    Authorization Type UHC    PT Start Time 1615    PT Stop Time 1703    PT Time Calculation (min) 48 min    Activity Tolerance Patient limited by pain;Patient tolerated treatment well    Behavior During Therapy [facility] for tasks assessed/performed                    Past Medical History:  Diagnosis Date   Arthritis    Back pain of lumbar region with sciatica    Cancer (HCC)    breast   Depression    Diabetes mellitus, type II (HCC)    Family history of anesthesia complication    grandfather died under anesthesia 70's- had heart issues   H/O carpal tunnel repair 2014   Headache(784.0)    Hyperlipidemia    Hypertension    Hypothyroidism    Medial meniscus tear    PONV (postoperative nausea and vomiting)    Sleep apnea    cpap since 10   Past Surgical History:  Procedure Laterality Date   BILATERAL TOTAL MASTECTOMY WITH AXILLARY LYMPH NODE DISSECTION     BREAST RECONSTRUCTION     CERVICAL DISC ARTHROPLASTY N/A 03/19/2013   Procedure: CERVICAL FIVE TO SIX, CERVICAL SIX TO SEVEN CERVICAL ANTERIOR DISC ARTHROPLASTY;  Surgeon: Barnett Abu, MD;  Location: [facility];  Service: Neurosurgery;  Laterality: N/A;  C56 C67 artificial disc replacement   CESAREAN SECTION     COLONOSCOPY     DILATATION & CURETTAGE/HYSTEROSCOPY WITH TRUECLEAR N/A 11/25/2013   Procedure: DILATATION & CURETTAGE/HYSTEROSCOPY WITH TRUCLEAR;  Surgeon: Meriel Pica, MD;  Location: [facility];  Service: Gynecology;  Laterality: N/A;   ECTOPIC PREGNANCY SURGERY     GALLBLADDER SURGERY     MOUTH SURGERY     child- dog bite   SPINAL FUSION     2023   STERIOD INJECTION Left 09/09/2022   Procedure: LEFT KNEE STEROID INJECTION;   Surgeon: Ollen Gross, MD;  Location: [facility];  Service: Orthopedics;  Laterality: Left;  left knee injection 0928   TONSILLECTOMY     TOTAL KNEE ARTHROPLASTY Right 09/09/2022   Procedure: TOTAL KNEE ARTHROPLASTY;  Surgeon: Ollen Gross, MD;  Location: [facility];  Service: Orthopedics;  Laterality: Right;   TUBAL LIGATION     Patient Active Problem List   Diagnosis Date Noted   OA (osteoarthritis) of knee 09/09/2022   Exertional dyspnea 04/17/2022   Elevated coronary artery calcium score 04/17/2022   Spondylolisthesis at L4-L5 level 12/27/2021   Ductal carcinoma in situ (DCIS) of left breast 01/19/2018   Generalized anxiety disorder 11/07/2017   Depression 11/07/2017   Insomnia 11/07/2017   Cervical spondylosis 03/19/2013    PCP: Creola Corn  REFERRING PROVIDER: Trudee Grip  REFERRING DIAG: R TKA 09/09/22  THERAPY DIAG:  S/P total knee arthroplasty, right  Acute pain of right knee  Stiffness of right knee, not elsewhere classified  Muscle weakness (generalized)  Other abnormalities of gait and mobility  Localized edema  Rationale for Evaluation and Treatment: Rehabilitation  ONSET DATE: 09/09/22  SUBJECTIVE:   SUBJECTIVE STATEMENT : Patient saw her doctor and they told her limitations are coming from muscle guarding  OUTPATIENT PHYSICAL THERAPY LOWER EXTREMITY TREATMENT    Patient Name: [name] MRN: [id number] DOB:Aug 31, 1960, 62 y.o., female Today's Date: 10/16/2022  END OF SESSION:  PT End of Session - 10/16/22 1631     Visit Number 13    Date for PT Re-Evaluation 12/05/22    Authorization Type UHC    PT Start Time 1615    PT Stop Time 1703    PT Time Calculation (min) 48 min    Activity Tolerance Patient limited by pain;Patient tolerated treatment well    Behavior During Therapy [facility] for tasks assessed/performed                    Past Medical History:  Diagnosis Date   Arthritis    Back pain of lumbar region with sciatica    Cancer (HCC)    breast   Depression    Diabetes mellitus, type II (HCC)    Family history of anesthesia complication    grandfather died under anesthesia 70's- had heart issues   H/O carpal tunnel repair 2014   Headache(784.0)    Hyperlipidemia    Hypertension    Hypothyroidism    Medial meniscus tear    PONV (postoperative nausea and vomiting)    Sleep apnea    cpap since 10   Past Surgical History:  Procedure Laterality Date   BILATERAL TOTAL MASTECTOMY WITH AXILLARY LYMPH NODE DISSECTION     BREAST RECONSTRUCTION     CERVICAL DISC ARTHROPLASTY N/A 03/19/2013   Procedure: CERVICAL FIVE TO SIX, CERVICAL SIX TO SEVEN CERVICAL ANTERIOR DISC ARTHROPLASTY;  Surgeon: Barnett Abu, MD;  [facility]: [facility];  Service: Neurosurgery;  Laterality: N/A;  C56 C67 artificial disc replacement   CESAREAN SECTION     COLONOSCOPY     DILATATION & CURETTAGE/HYSTEROSCOPY WITH TRUECLEAR N/A 11/25/2013   Procedure: DILATATION & CURETTAGE/HYSTEROSCOPY WITH TRUCLEAR;  Surgeon: Meriel Pica, MD;  [facility]: [facility];  Service: Gynecology;  Laterality: N/A;   ECTOPIC PREGNANCY SURGERY     GALLBLADDER SURGERY     MOUTH SURGERY     child- dog bite   SPINAL FUSION     2023   STERIOD INJECTION Left 09/09/2022   Procedure: LEFT KNEE STEROID INJECTION;   Surgeon: Ollen Gross, MD;  [facility];  Service: Orthopedics;  Laterality: Left;  left knee injection 0928   TONSILLECTOMY     TOTAL KNEE ARTHROPLASTY Right 09/09/2022   Procedure: TOTAL KNEE ARTHROPLASTY;  Surgeon: Ollen Gross, MD;  [facility]: [facility];  Service: Orthopedics;  Laterality: Right;   TUBAL LIGATION     Patient Active Problem List   Diagnosis Date Noted   OA (osteoarthritis) of knee 09/09/2022   Exertional dyspnea 04/17/2022   Elevated coronary artery calcium score 04/17/2022   Spondylolisthesis at L4-L5 level 12/27/2021   Ductal carcinoma in situ (DCIS) of left breast 01/19/2018   Generalized anxiety disorder 11/07/2017   Depression 11/07/2017   Insomnia 11/07/2017   Cervical spondylosis 03/19/2013    PCP: Creola Corn  REFERRING PROVIDER: Trudee Grip  REFERRING DIAG: R TKA 09/09/22  THERAPY DIAG:  S/P total knee arthroplasty, right  Acute pain of right knee  Stiffness of right knee, not elsewhere classified  Muscle weakness (generalized)  Other abnormalities of gait and mobility  Localized edema  Rationale for Evaluation and Treatment: Rehabilitation  ONSET DATE: 09/09/22  SUBJECTIVE:   SUBJECTIVE STATEMENT : Patient saw her doctor and they told her limitations are coming from muscle guarding  OUTPATIENT PHYSICAL THERAPY LOWER EXTREMITY TREATMENT    Patient Name: [name] MRN: [id number] DOB:Aug 31, 1960, 62 y.o., female Today's Date: 10/16/2022  END OF SESSION:  PT End of Session - 10/16/22 1631     Visit Number 13    Date for PT Re-Evaluation 12/05/22    Authorization Type UHC    PT Start Time 1615    PT Stop Time 1703    PT Time Calculation (min) 48 min    Activity Tolerance Patient limited by pain;Patient tolerated treatment well    Behavior During Therapy [facility] for tasks assessed/performed                    Past Medical History:  Diagnosis Date   Arthritis    Back pain of lumbar region with sciatica    Cancer (HCC)    breast   Depression    Diabetes mellitus, type II (HCC)    Family history of anesthesia complication    grandfather died under anesthesia 70's- had heart issues   H/O carpal tunnel repair 2014   Headache(784.0)    Hyperlipidemia    Hypertension    Hypothyroidism    Medial meniscus tear    PONV (postoperative nausea and vomiting)    Sleep apnea    cpap since 10   Past Surgical History:  Procedure Laterality Date   BILATERAL TOTAL MASTECTOMY WITH AXILLARY LYMPH NODE DISSECTION     BREAST RECONSTRUCTION     CERVICAL DISC ARTHROPLASTY N/A 03/19/2013   Procedure: CERVICAL FIVE TO SIX, CERVICAL SIX TO SEVEN CERVICAL ANTERIOR DISC ARTHROPLASTY;  Surgeon: Barnett Abu, MD;  [facility]: [facility];  Service: Neurosurgery;  Laterality: N/A;  C56 C67 artificial disc replacement   CESAREAN SECTION     COLONOSCOPY     DILATATION & CURETTAGE/HYSTEROSCOPY WITH TRUECLEAR N/A 11/25/2013   Procedure: DILATATION & CURETTAGE/HYSTEROSCOPY WITH TRUCLEAR;  Surgeon: Meriel Pica, MD;  [facility]: [facility];  Service: Gynecology;  Laterality: N/A;   ECTOPIC PREGNANCY SURGERY     GALLBLADDER SURGERY     MOUTH SURGERY     child- dog bite   SPINAL FUSION     2023   STERIOD INJECTION Left 09/09/2022   Procedure: LEFT KNEE STEROID INJECTION;   Surgeon: Ollen Gross, MD;  [facility]: [facility];  Service: Orthopedics;  Laterality: Left;  left knee injection 0928   TONSILLECTOMY     TOTAL KNEE ARTHROPLASTY Right 09/09/2022   Procedure: TOTAL KNEE ARTHROPLASTY;  Surgeon: Ollen Gross, MD;  [facility]: [facility];  Service: Orthopedics;  Laterality: Right;   TUBAL LIGATION     Patient Active Problem List   Diagnosis Date Noted   OA (osteoarthritis) of knee 09/09/2022   Exertional dyspnea 04/17/2022   Elevated coronary artery calcium score 04/17/2022   Spondylolisthesis at L4-L5 level 12/27/2021   Ductal carcinoma in situ (DCIS) of left breast 01/19/2018   Generalized anxiety disorder 11/07/2017   Depression 11/07/2017   Insomnia 11/07/2017   Cervical spondylosis 03/19/2013    PCP: Creola Corn  REFERRING PROVIDER: Trudee Grip  REFERRING DIAG: R TKA 09/09/22  THERAPY DIAG:  S/P total knee arthroplasty, right  Acute pain of right knee  Stiffness of right knee, not elsewhere classified  Muscle weakness (generalized)  Other abnormalities of gait and mobility  Localized edema  Rationale for Evaluation and Treatment: Rehabilitation  ONSET DATE: 09/09/22  SUBJECTIVE:   SUBJECTIVE STATEMENT : Patient saw her doctor and they told her limitations are coming from muscle guarding  OUTPATIENT PHYSICAL THERAPY LOWER EXTREMITY TREATMENT    Patient Name: [name] MRN: [id number] DOB:Aug 31, 1960, 62 y.o., female Today's Date: 10/16/2022  END OF SESSION:  PT End of Session - 10/16/22 1631     Visit Number 13    Date for PT Re-Evaluation 12/05/22    Authorization Type UHC    PT Start Time 1615    PT Stop Time 1703    PT Time Calculation (min) 48 min    Activity Tolerance Patient limited by pain;Patient tolerated treatment well    Behavior During Therapy [facility] for tasks assessed/performed                    Past Medical History:  Diagnosis Date   Arthritis    Back pain of lumbar region with sciatica    Cancer (HCC)    breast   Depression    Diabetes mellitus, type II (HCC)    Family history of anesthesia complication    grandfather died under anesthesia 70's- had heart issues   H/O carpal tunnel repair 2014   Headache(784.0)    Hyperlipidemia    Hypertension    Hypothyroidism    Medial meniscus tear    PONV (postoperative nausea and vomiting)    Sleep apnea    cpap since 10   Past Surgical History:  Procedure Laterality Date   BILATERAL TOTAL MASTECTOMY WITH AXILLARY LYMPH NODE DISSECTION     BREAST RECONSTRUCTION     CERVICAL DISC ARTHROPLASTY N/A 03/19/2013   Procedure: CERVICAL FIVE TO SIX, CERVICAL SIX TO SEVEN CERVICAL ANTERIOR DISC ARTHROPLASTY;  Surgeon: Barnett Abu, MD;  Location: [facility];  Service: Neurosurgery;  Laterality: N/A;  C56 C67 artificial disc replacement   CESAREAN SECTION     COLONOSCOPY     DILATATION & CURETTAGE/HYSTEROSCOPY WITH TRUECLEAR N/A 11/25/2013   Procedure: DILATATION & CURETTAGE/HYSTEROSCOPY WITH TRUCLEAR;  Surgeon: Meriel Pica, MD;  Location: [facility];  Service: Gynecology;  Laterality: N/A;   ECTOPIC PREGNANCY SURGERY     GALLBLADDER SURGERY     MOUTH SURGERY     child- dog bite   SPINAL FUSION     2023   STERIOD INJECTION Left 09/09/2022   Procedure: LEFT KNEE STEROID INJECTION;   Surgeon: Ollen Gross, MD;  Location: [facility];  Service: Orthopedics;  Laterality: Left;  left knee injection 0928   TONSILLECTOMY     TOTAL KNEE ARTHROPLASTY Right 09/09/2022   Procedure: TOTAL KNEE ARTHROPLASTY;  Surgeon: Ollen Gross, MD;  Location: [facility];  Service: Orthopedics;  Laterality: Right;   TUBAL LIGATION     Patient Active Problem List   Diagnosis Date Noted   OA (osteoarthritis) of knee 09/09/2022   Exertional dyspnea 04/17/2022   Elevated coronary artery calcium score 04/17/2022   Spondylolisthesis at L4-L5 level 12/27/2021   Ductal carcinoma in situ (DCIS) of left breast 01/19/2018   Generalized anxiety disorder 11/07/2017   Depression 11/07/2017   Insomnia 11/07/2017   Cervical spondylosis 03/19/2013    PCP: Creola Corn  REFERRING PROVIDER: Trudee Grip  REFERRING DIAG: R TKA 09/09/22  THERAPY DIAG:  S/P total knee arthroplasty, right  Acute pain of right knee  Stiffness of right knee, not elsewhere classified  Muscle weakness (generalized)  Other abnormalities of gait and mobility  Localized edema  Rationale for Evaluation and Treatment: Rehabilitation  ONSET DATE: 09/09/22  SUBJECTIVE:   SUBJECTIVE STATEMENT : Patient saw her doctor and they told her limitations are coming from muscle guarding

## 2022-10-18 ENCOUNTER — Ambulatory Visit: Payer: No Typology Code available for payment source | Admitting: Physical Therapy

## 2022-10-18 DIAGNOSIS — M25561 Pain in right knee: Secondary | ICD-10-CM

## 2022-10-18 DIAGNOSIS — M25661 Stiffness of right knee, not elsewhere classified: Secondary | ICD-10-CM

## 2022-10-18 DIAGNOSIS — Z96651 Presence of right artificial knee joint: Secondary | ICD-10-CM | POA: Diagnosis not present

## 2022-10-18 DIAGNOSIS — M6281 Muscle weakness (generalized): Secondary | ICD-10-CM

## 2022-10-18 NOTE — Therapy (Signed)
OUTPATIENT PHYSICAL THERAPY LOWER EXTREMITY TREATMENT    Patient Name: [name] MRN: [id number] DOB:1960/09/04, 62 y.o., female Today's Date: 10/18/2022  END OF SESSION:  PT End of Session - 10/18/22 1101     Visit Number 14    Date for PT Re-Evaluation 12/05/22    Authorization Type UHC    PT Start Time 1100    PT Stop Time 1145    PT Time Calculation (min) 45 min                    Past Medical History:  Diagnosis Date   Arthritis    Back pain of lumbar region with sciatica    Cancer (HCC)    breast   Depression    Diabetes mellitus, type II (HCC)    Family history of anesthesia complication    grandfather died under anesthesia 70's- had heart issues   H/O carpal tunnel repair 2014   Headache(784.0)    Hyperlipidemia    Hypertension    Hypothyroidism    Medial meniscus tear    PONV (postoperative nausea and vomiting)    Sleep apnea    cpap since 10   Past Surgical History:  Procedure Laterality Date   BILATERAL TOTAL MASTECTOMY WITH AXILLARY LYMPH NODE DISSECTION     BREAST RECONSTRUCTION     CERVICAL DISC ARTHROPLASTY N/A 03/19/2013   Procedure: CERVICAL FIVE TO SIX, CERVICAL SIX TO SEVEN CERVICAL ANTERIOR DISC ARTHROPLASTY;  Surgeon: Barnett Abu, MD;  Location: [facility];  Service: Neurosurgery;  Laterality: N/A;  C56 C67 artificial disc replacement   CESAREAN SECTION     COLONOSCOPY     DILATATION & CURETTAGE/HYSTEROSCOPY WITH TRUECLEAR N/A 11/25/2013   Procedure: DILATATION & CURETTAGE/HYSTEROSCOPY WITH TRUCLEAR;  Surgeon: Meriel Pica, MD;  Location: [facility];  Service: Gynecology;  Laterality: N/A;   ECTOPIC PREGNANCY SURGERY     GALLBLADDER SURGERY     MOUTH SURGERY     child- dog bite   SPINAL FUSION     2023   STERIOD INJECTION Left 09/09/2022   Procedure: LEFT KNEE STEROID INJECTION;  Surgeon: Ollen Gross, MD;  [facility]: [facility];  Service: Orthopedics;  Laterality: Left;  left knee injection 0928   TONSILLECTOMY      TOTAL KNEE ARTHROPLASTY Right 09/09/2022   Procedure: TOTAL KNEE ARTHROPLASTY;  Surgeon: Ollen Gross, MD;  Location: [facility];  Service: Orthopedics;  Laterality: Right;   TUBAL LIGATION     Patient Active Problem List   Diagnosis Date Noted   OA (osteoarthritis) of knee 09/09/2022   Exertional dyspnea 04/17/2022   Elevated coronary artery calcium score 04/17/2022   Spondylolisthesis at L4-L5 level 12/27/2021   Ductal carcinoma in situ (DCIS) of left breast 01/19/2018   Generalized anxiety disorder 11/07/2017   Depression 11/07/2017   Insomnia 11/07/2017   Cervical spondylosis 03/19/2013    PCP: Creola Corn  REFERRING PROVIDER: Trudee Grip  REFERRING DIAG: R TKA 09/09/22  THERAPY DIAG:  S/P total knee arthroplasty, right  Acute pain of right knee  Stiffness of right knee, not elsewhere classified  Muscle weakness (generalized)  Rationale for Evaluation and Treatment: Rehabilitation  ONSET DATE: 09/09/22  SUBJECTIVE:   SUBJECTIVE STATEMENT : " I know I need to work hard" PERTINENT HISTORY: R TKA 8/19 L knee steroid injection 09/09/22  PAIN:  Are you having pain? Yes: NPRS scale: 5/10 Pain location: R knee Pain description: sharp medial R knee  Aggravating factors: weight bearing on the  Balance training, Gait training, Patient/Family education, Self Care, Joint mobilization, Stair training, Electrical stimulation, Cryotherapy, Moist heat, scar mobilization, Vasopneumatic device, and Manual therapy  PLAN FOR NEXT SESSION: aggressive ROM    Patient Details  Name: [name] MRN: [id number] Date of Birth: [birth year] Referring Provider:  Ollen Gross, MD  Encounter Date: 10/18/2022   Suanne Marker, PTA 10/18/2022, 11:01 AM  [facility] [address] [facility] [place], [place], [address] Phone: [phone]   Fax:  [phone] at [facility] [address] [facility] [address] [place], [place], [address] Phone: [phone]   Fax:  [phone]  OUTPATIENT PHYSICAL THERAPY LOWER EXTREMITY TREATMENT    Patient Name: [name] MRN: [id number] DOB:1960/09/04, 62 y.o., female Today's Date: 10/18/2022  END OF SESSION:  PT End of Session - 10/18/22 1101     Visit Number 14    Date for PT Re-Evaluation 12/05/22    Authorization Type UHC    PT Start Time 1100    PT Stop Time 1145    PT Time Calculation (min) 45 min                    Past Medical History:  Diagnosis Date   Arthritis    Back pain of lumbar region with sciatica    Cancer (HCC)    breast   Depression    Diabetes mellitus, type II (HCC)    Family history of anesthesia complication    grandfather died under anesthesia 70's- had heart issues   H/O carpal tunnel repair 2014   Headache(784.0)    Hyperlipidemia    Hypertension    Hypothyroidism    Medial meniscus tear    PONV (postoperative nausea and vomiting)    Sleep apnea    cpap since 10   Past Surgical History:  Procedure Laterality Date   BILATERAL TOTAL MASTECTOMY WITH AXILLARY LYMPH NODE DISSECTION     BREAST RECONSTRUCTION     CERVICAL DISC ARTHROPLASTY N/A 03/19/2013   Procedure: CERVICAL FIVE TO SIX, CERVICAL SIX TO SEVEN CERVICAL ANTERIOR DISC ARTHROPLASTY;  Surgeon: Barnett Abu, MD;  Location: [facility];  Service: Neurosurgery;  Laterality: N/A;  C56 C67 artificial disc replacement   CESAREAN SECTION     COLONOSCOPY     DILATATION & CURETTAGE/HYSTEROSCOPY WITH TRUECLEAR N/A 11/25/2013   Procedure: DILATATION & CURETTAGE/HYSTEROSCOPY WITH TRUCLEAR;  Surgeon: Meriel Pica, MD;  Location: [facility];  Service: Gynecology;  Laterality: N/A;   ECTOPIC PREGNANCY SURGERY     GALLBLADDER SURGERY     MOUTH SURGERY     child- dog bite   SPINAL FUSION     2023   STERIOD INJECTION Left 09/09/2022   Procedure: LEFT KNEE STEROID INJECTION;  Surgeon: Ollen Gross, MD;  Location: [facility];  Service: Orthopedics;  Laterality: Left;  left knee injection 0928   TONSILLECTOMY      TOTAL KNEE ARTHROPLASTY Right 09/09/2022   Procedure: TOTAL KNEE ARTHROPLASTY;  Surgeon: Ollen Gross, MD;  Location: [facility];  Service: Orthopedics;  Laterality: Right;   TUBAL LIGATION     Patient Active Problem List   Diagnosis Date Noted   OA (osteoarthritis) of knee 09/09/2022   Exertional dyspnea 04/17/2022   Elevated coronary artery calcium score 04/17/2022   Spondylolisthesis at L4-L5 level 12/27/2021   Ductal carcinoma in situ (DCIS) of left breast 01/19/2018   Generalized anxiety disorder 11/07/2017   Depression 11/07/2017   Insomnia 11/07/2017   Cervical spondylosis 03/19/2013    PCP: Creola Corn  REFERRING PROVIDER: Trudee Grip  REFERRING DIAG: R TKA 09/09/22  THERAPY DIAG:  S/P total knee arthroplasty, right  Acute pain of right knee  Stiffness of right knee, not elsewhere classified  Muscle weakness (generalized)  Rationale for Evaluation and Treatment: Rehabilitation  ONSET DATE: 09/09/22  SUBJECTIVE:   SUBJECTIVE STATEMENT : " I know I need to work hard" PERTINENT HISTORY: R TKA 8/19 L knee steroid injection 09/09/22  PAIN:  Are you having pain? Yes: NPRS scale: 5/10 Pain location: R knee Pain description: sharp medial R knee  Aggravating factors: weight bearing on the  Balance training, Gait training, Patient/Family education, Self Care, Joint mobilization, Stair training, Electrical stimulation, Cryotherapy, Moist heat, scar mobilization, Vasopneumatic device, and Manual therapy  PLAN FOR NEXT SESSION: aggressive ROM    Patient Details  Name: [name] MRN: [id number] Date of Birth: [birth year] Referring Provider:  Ollen Gross, MD  Encounter Date: 10/18/2022   Suanne Marker, PTA 10/18/2022, 11:01 AM  [facility] [address] [place], [place], [address] Phone: [phone]   Fax:  [phone]  [facility] at [facility] [address] [facility] [address]. [place], [place], [address] Phone: [phone]   Fax:  [phone]  Balance training, Gait training, Patient/Family education, Self Care, Joint mobilization, Stair training, Electrical stimulation, Cryotherapy, Moist heat, scar mobilization, Vasopneumatic device, and Manual therapy  PLAN FOR NEXT SESSION: aggressive ROM    Patient Details  Name: [name] MRN: [id number] Date of Birth: [birth year] Referring Provider:  Ollen Gross, MD  Encounter Date: 10/18/2022   Suanne Marker, PTA 10/18/2022, 11:01 AM  [facility] [address] [facility] [place], [place], [address] Phone: [phone]   Fax:  [phone] at [facility] [address] [facility] [address] [place], [place], [address] Phone: [phone]   Fax:  [phone]

## 2022-10-21 ENCOUNTER — Ambulatory Visit: Payer: No Typology Code available for payment source | Admitting: Professional Counselor

## 2022-10-21 ENCOUNTER — Ambulatory Visit: Payer: No Typology Code available for payment source | Admitting: Physical Therapy

## 2022-10-21 ENCOUNTER — Encounter: Payer: Self-pay | Admitting: Physical Therapy

## 2022-10-21 DIAGNOSIS — M25561 Pain in right knee: Secondary | ICD-10-CM

## 2022-10-21 DIAGNOSIS — Z96651 Presence of right artificial knee joint: Secondary | ICD-10-CM | POA: Diagnosis not present

## 2022-10-21 DIAGNOSIS — M25661 Stiffness of right knee, not elsewhere classified: Secondary | ICD-10-CM

## 2022-10-21 DIAGNOSIS — R2689 Other abnormalities of gait and mobility: Secondary | ICD-10-CM

## 2022-10-21 DIAGNOSIS — R600 Localized edema: Secondary | ICD-10-CM

## 2022-10-21 DIAGNOSIS — M6281 Muscle weakness (generalized): Secondary | ICD-10-CM

## 2022-10-21 NOTE — Therapy (Signed)
mobilization, Vasopneumatic device, and Manual therapy  PLAN FOR NEXT SESSION: will continue to alter plan and try to gain her ROM   Patient Details  Name: [name] MRN: [id number] Date of Birth: [birth year] Referring Provider:  Ollen Gross, MD  Encounter Date: 10/21/2022   Jearld Lesch, PT 10/21/2022, 5:00 PM  mobilization, Vasopneumatic device, and Manual therapy  PLAN FOR NEXT SESSION: will continue to alter plan and try to gain her ROM   Patient Details  Name: [name] MRN: [id number] Date of Birth: [birth year] Referring Provider:  Ollen Gross, MD  Encounter Date: 10/21/2022   Jearld Lesch, PT 10/21/2022, 5:00 PM  OUTPATIENT PHYSICAL THERAPY LOWER EXTREMITY TREATMENT    Patient Name: [name] MRN: [id number] DOB:Dec 21, 1960, 62 y.o., female Today's Date: 10/21/2022  END OF SESSION:  PT End of Session - 10/21/22 1700     Visit Number 15    Date for PT Re-Evaluation 12/05/22    Authorization Type [employer]    PT Start Time 1614    PT Stop Time 1700    PT Time Calculation (min) 46 min    Activity Tolerance Patient limited by pain;Patient tolerated treatment well    Behavior During Therapy [facility] for tasks assessed/performed                    Past Medical History:  Diagnosis Date   Arthritis    Back pain of lumbar region with sciatica    Cancer (HCC)    breast   Depression    Diabetes mellitus, type II (HCC)    Family history of anesthesia complication    grandfather died under anesthesia 70's- had heart issues   H/O carpal tunnel repair 2014   Headache(784.0)    Hyperlipidemia    Hypertension    Hypothyroidism    Medial meniscus tear    PONV (postoperative nausea and vomiting)    Sleep apnea    cpap since 10   Past Surgical History:  Procedure Laterality Date   BILATERAL TOTAL MASTECTOMY WITH AXILLARY LYMPH NODE DISSECTION     BREAST RECONSTRUCTION     CERVICAL DISC ARTHROPLASTY N/A 03/19/2013   Procedure: CERVICAL FIVE TO SIX, CERVICAL SIX TO SEVEN CERVICAL ANTERIOR DISC ARTHROPLASTY;  Surgeon: Barnett Abu, MD;  Location: [facility];  Service: Neurosurgery;  Laterality: N/A;  C56 C67 artificial disc replacement   CESAREAN SECTION     COLONOSCOPY     DILATATION & CURETTAGE/HYSTEROSCOPY WITH TRUECLEAR N/A 11/25/2013   Procedure: DILATATION & CURETTAGE/HYSTEROSCOPY WITH TRUCLEAR;  Surgeon: Meriel Pica, MD;  Location: [facility];  Service: Gynecology;  Laterality: N/A;   ECTOPIC PREGNANCY SURGERY     GALLBLADDER SURGERY     MOUTH SURGERY     child- dog bite   SPINAL FUSION     2023   STERIOD INJECTION Left 09/09/2022   Procedure: LEFT KNEE STEROID INJECTION;   Surgeon: Ollen Gross, MD;  Location: [facility];  Service: Orthopedics;  Laterality: Left;  left knee injection 0928   TONSILLECTOMY     TOTAL KNEE ARTHROPLASTY Right 09/09/2022   Procedure: TOTAL KNEE ARTHROPLASTY;  Surgeon: Ollen Gross, MD;  Location: [facility];  Service: Orthopedics;  Laterality: Right;   TUBAL LIGATION     Patient Active Problem List   Diagnosis Date Noted   OA (osteoarthritis) of knee 09/09/2022   Exertional dyspnea 04/17/2022   Elevated coronary artery calcium score 04/17/2022   Spondylolisthesis at L4-L5 level 12/27/2021   Ductal carcinoma in situ (DCIS) of left breast 01/19/2018   Generalized anxiety disorder 11/07/2017   Depression 11/07/2017   Insomnia 11/07/2017   Cervical spondylosis 03/19/2013    PCP: Creola Corn  REFERRING PROVIDER: Trudee Grip  REFERRING DIAG: R TKA 09/09/22  THERAPY DIAG:  S/P total knee arthroplasty, right  Acute pain of right knee  Stiffness of right knee, not elsewhere classified  Muscle weakness (generalized)  Other abnormalities of gait and mobility  Localized edema  Rationale for Evaluation and Treatment: Rehabilitation  ONSET DATE: 09/09/22  SUBJECTIVE:   SUBJECTIVE STATEMENT :  Patient reports that she was very upset after the last treatment, "feels like  mobilization, Vasopneumatic device, and Manual therapy  PLAN FOR NEXT SESSION: will continue to alter plan and try to gain her ROM   Patient Details  Name: [name] MRN: [id number] Date of Birth: [birth year] Referring Provider:  Ollen Gross, MD  Encounter Date: 10/21/2022   Jearld Lesch, PT 10/21/2022, 5:00 PM  mobilization, Vasopneumatic device, and Manual therapy  PLAN FOR NEXT SESSION: will continue to alter plan and try to gain her ROM   Patient Details  Name: [name] MRN: [id number] Date of Birth: [birth year] Referring Provider:  Ollen Gross, MD  Encounter Date: 10/21/2022   Jearld Lesch, PT 10/21/2022, 5:00 PM

## 2022-10-23 ENCOUNTER — Encounter: Payer: Self-pay | Admitting: Physical Therapy

## 2022-10-23 ENCOUNTER — Ambulatory Visit: Payer: No Typology Code available for payment source | Attending: Orthopedic Surgery | Admitting: Physical Therapy

## 2022-10-23 ENCOUNTER — Telehealth: Payer: Self-pay | Admitting: Neurology

## 2022-10-23 DIAGNOSIS — Z96651 Presence of right artificial knee joint: Secondary | ICD-10-CM | POA: Diagnosis present

## 2022-10-23 DIAGNOSIS — M6281 Muscle weakness (generalized): Secondary | ICD-10-CM | POA: Insufficient documentation

## 2022-10-23 DIAGNOSIS — M25661 Stiffness of right knee, not elsewhere classified: Secondary | ICD-10-CM | POA: Insufficient documentation

## 2022-10-23 DIAGNOSIS — R2689 Other abnormalities of gait and mobility: Secondary | ICD-10-CM | POA: Diagnosis present

## 2022-10-23 DIAGNOSIS — M25561 Pain in right knee: Secondary | ICD-10-CM | POA: Insufficient documentation

## 2022-10-23 DIAGNOSIS — R600 Localized edema: Secondary | ICD-10-CM | POA: Insufficient documentation

## 2022-10-23 NOTE — Telephone Encounter (Signed)
Pt called wanting to know if her Initial Cpap can be done VV due to just having a total R Knee replacement. Please advise.

## 2022-10-23 NOTE — Telephone Encounter (Signed)
Yes absolutely, data is online. Please change her appointment. Thank you!

## 2022-10-23 NOTE — Therapy (Signed)
OUTPATIENT PHYSICAL THERAPY LOWER EXTREMITY TREATMENT    Patient Name: [name] MRN: [id number] DOB:10-30-1960, 62 y.o., female Today's Date: 10/23/2022  END OF SESSION:  PT End of Session - 10/23/22 1630     Visit Number 16    Date for PT Re-Evaluation 12/05/22    Authorization Type [employer]    PT Start Time 1615    PT Stop Time 1700    PT Time Calculation (min) 45 min    Activity Tolerance Patient limited by pain;Patient tolerated treatment well    Behavior During Therapy [facility] for tasks assessed/performed                    Past Medical History:  Diagnosis Date   Arthritis    Back pain of lumbar region with sciatica    Cancer (HCC)    breast   Depression    Diabetes mellitus, type II (HCC)    Family history of anesthesia complication    grandfather died under anesthesia 70's- had heart issues   H/O carpal tunnel repair 2014   Headache(784.0)    Hyperlipidemia    Hypertension    Hypothyroidism    Medial meniscus tear    PONV (postoperative nausea and vomiting)    Sleep apnea    cpap since 10   Past Surgical History:  Procedure Laterality Date   BILATERAL TOTAL MASTECTOMY WITH AXILLARY LYMPH NODE DISSECTION     BREAST RECONSTRUCTION     CERVICAL DISC ARTHROPLASTY N/A 03/19/2013   Procedure: CERVICAL FIVE TO SIX, CERVICAL SIX TO SEVEN CERVICAL ANTERIOR DISC ARTHROPLASTY;  Surgeon: Barnett Abu, MD;  Location: [facility];  Service: Neurosurgery;  Laterality: N/A;  C56 C67 artificial disc replacement   CESAREAN SECTION     COLONOSCOPY     DILATATION & CURETTAGE/HYSTEROSCOPY WITH TRUECLEAR N/A 11/25/2013   Procedure: DILATATION & CURETTAGE/HYSTEROSCOPY WITH TRUCLEAR;  Surgeon: Meriel Pica, MD;  Location: [facility];  Service: Gynecology;  Laterality: N/A;   ECTOPIC PREGNANCY SURGERY     GALLBLADDER SURGERY     MOUTH SURGERY     child- dog bite   SPINAL FUSION     2023   STERIOD INJECTION Left 09/09/2022   Procedure: LEFT KNEE STEROID INJECTION;   Surgeon: Ollen Gross, MD;  Location: [facility];  Service: Orthopedics;  Laterality: Left;  left knee injection 0928   TONSILLECTOMY     TOTAL KNEE ARTHROPLASTY Right 09/09/2022   Procedure: TOTAL KNEE ARTHROPLASTY;  Surgeon: Ollen Gross, MD;  Location: [facility];  Service: Orthopedics;  Laterality: Right;   TUBAL LIGATION     Patient Active Problem List   Diagnosis Date Noted   OA (osteoarthritis) of knee 09/09/2022   Exertional dyspnea 04/17/2022   Elevated coronary artery calcium score 04/17/2022   Spondylolisthesis at L4-L5 level 12/27/2021   Ductal carcinoma in situ (DCIS) of left breast 01/19/2018   Generalized anxiety disorder 11/07/2017   Depression 11/07/2017   Insomnia 11/07/2017   Cervical spondylosis 03/19/2013    PCP: Creola Corn  REFERRING PROVIDER: Trudee Grip  REFERRING DIAG: R TKA 09/09/22  THERAPY DIAG:  S/P total knee arthroplasty, right  Acute pain of right knee  Stiffness of right knee, not elsewhere classified  Muscle weakness (generalized)  Other abnormalities of gait and mobility  Localized edema  Rationale for Evaluation and Treatment: Rehabilitation  ONSET DATE: 09/09/22  SUBJECTIVE:   SUBJECTIVE STATEMENT :  PAtient in better spirits feels like it is moving a little better PERTINENT  functional ability. Baseline: 4 Goal status: INITIAL  PLAN:  PT FREQUENCY: 2x/week  PT DURATION: 12 weeks  PLANNED INTERVENTIONS: Therapeutic exercises, Therapeutic activity, Neuromuscular re-education, Balance training, Gait training, Patient/Family education, Self Care, Joint mobilization, Stair training, Electrical stimulation, Cryotherapy, Moist heat, scar mobilization, Vasopneumatic device, and Manual therapy  PLAN FOR NEXT SESSION: will continue to alter plan and try to gain her ROM Measure next visit  Patient Details  Name: [name] MRN: [id number] Date of Birth: 1960/09/07 Referring Provider:  Ollen Gross, MD  Encounter Date: 10/23/2022   Jearld Lesch, PT 10/23/2022, 5:01 PM  functional ability. Baseline: 4 Goal status: INITIAL  PLAN:  PT FREQUENCY: 2x/week  PT DURATION: 12 weeks  PLANNED INTERVENTIONS: Therapeutic exercises, Therapeutic activity, Neuromuscular re-education, Balance training, Gait training, Patient/Family education, Self Care, Joint mobilization, Stair training, Electrical stimulation, Cryotherapy, Moist heat, scar mobilization, Vasopneumatic device, and Manual therapy  PLAN FOR NEXT SESSION: will continue to alter plan and try to gain her ROM Measure next visit  Patient Details  Name: [name] MRN: [id number] Date of Birth: 1960/09/07 Referring Provider:  Ollen Gross, MD  Encounter Date: 10/23/2022   Jearld Lesch, PT 10/23/2022, 5:01 PM  functional ability. Baseline: 4 Goal status: INITIAL  PLAN:  PT FREQUENCY: 2x/week  PT DURATION: 12 weeks  PLANNED INTERVENTIONS: Therapeutic exercises, Therapeutic activity, Neuromuscular re-education, Balance training, Gait training, Patient/Family education, Self Care, Joint mobilization, Stair training, Electrical stimulation, Cryotherapy, Moist heat, scar mobilization, Vasopneumatic device, and Manual therapy  PLAN FOR NEXT SESSION: will continue to alter plan and try to gain her ROM Measure next visit  Patient Details  Name: [name] MRN: [id number] Date of Birth: 1960/09/07 Referring Provider:  Ollen Gross, MD  Encounter Date: 10/23/2022   Jearld Lesch, PT 10/23/2022, 5:01 PM  OUTPATIENT PHYSICAL THERAPY LOWER EXTREMITY TREATMENT    Patient Name: [name] MRN: [id number] DOB:10-30-1960, 62 y.o., female Today's Date: 10/23/2022  END OF SESSION:  PT End of Session - 10/23/22 1630     Visit Number 16    Date for PT Re-Evaluation 12/05/22    Authorization Type [employer]    PT Start Time 1615    PT Stop Time 1700    PT Time Calculation (min) 45 min    Activity Tolerance Patient limited by pain;Patient tolerated treatment well    Behavior During Therapy [facility] for tasks assessed/performed                    Past Medical History:  Diagnosis Date   Arthritis    Back pain of lumbar region with sciatica    Cancer (HCC)    breast   Depression    Diabetes mellitus, type II (HCC)    Family history of anesthesia complication    grandfather died under anesthesia 70's- had heart issues   H/O carpal tunnel repair 2014   Headache(784.0)    Hyperlipidemia    Hypertension    Hypothyroidism    Medial meniscus tear    PONV (postoperative nausea and vomiting)    Sleep apnea    cpap since 10   Past Surgical History:  Procedure Laterality Date   BILATERAL TOTAL MASTECTOMY WITH AXILLARY LYMPH NODE DISSECTION     BREAST RECONSTRUCTION     CERVICAL DISC ARTHROPLASTY N/A 03/19/2013   Procedure: CERVICAL FIVE TO SIX, CERVICAL SIX TO SEVEN CERVICAL ANTERIOR DISC ARTHROPLASTY;  Surgeon: Barnett Abu, MD;  Location: [facility];  Service: Neurosurgery;  Laterality: N/A;  C56 C67 artificial disc replacement   CESAREAN SECTION     COLONOSCOPY     DILATATION & CURETTAGE/HYSTEROSCOPY WITH TRUECLEAR N/A 11/25/2013   Procedure: DILATATION & CURETTAGE/HYSTEROSCOPY WITH TRUCLEAR;  Surgeon: Meriel Pica, MD;  Location: [facility];  Service: Gynecology;  Laterality: N/A;   ECTOPIC PREGNANCY SURGERY     GALLBLADDER SURGERY     MOUTH SURGERY     child- dog bite   SPINAL FUSION     2023   STERIOD INJECTION Left 09/09/2022   Procedure: LEFT KNEE STEROID INJECTION;   Surgeon: Ollen Gross, MD;  Location: [facility];  Service: Orthopedics;  Laterality: Left;  left knee injection 0928   TONSILLECTOMY     TOTAL KNEE ARTHROPLASTY Right 09/09/2022   Procedure: TOTAL KNEE ARTHROPLASTY;  Surgeon: Ollen Gross, MD;  Location: [facility];  Service: Orthopedics;  Laterality: Right;   TUBAL LIGATION     Patient Active Problem List   Diagnosis Date Noted   OA (osteoarthritis) of knee 09/09/2022   Exertional dyspnea 04/17/2022   Elevated coronary artery calcium score 04/17/2022   Spondylolisthesis at L4-L5 level 12/27/2021   Ductal carcinoma in situ (DCIS) of left breast 01/19/2018   Generalized anxiety disorder 11/07/2017   Depression 11/07/2017   Insomnia 11/07/2017   Cervical spondylosis 03/19/2013    PCP: Creola Corn  REFERRING PROVIDER: Trudee Grip  REFERRING DIAG: R TKA 09/09/22  THERAPY DIAG:  S/P total knee arthroplasty, right  Acute pain of right knee  Stiffness of right knee, not elsewhere classified  Muscle weakness (generalized)  Other abnormalities of gait and mobility  Localized edema  Rationale for Evaluation and Treatment: Rehabilitation  ONSET DATE: 09/09/22  SUBJECTIVE:   SUBJECTIVE STATEMENT :  PAtient in better spirits feels like it is moving a little better PERTINENT

## 2022-10-24 ENCOUNTER — Encounter: Payer: Self-pay | Admitting: Psychiatry

## 2022-10-24 ENCOUNTER — Ambulatory Visit (INDEPENDENT_AMBULATORY_CARE_PROVIDER_SITE_OTHER): Payer: No Typology Code available for payment source | Admitting: Psychiatry

## 2022-10-24 DIAGNOSIS — F411 Generalized anxiety disorder: Secondary | ICD-10-CM | POA: Diagnosis not present

## 2022-10-24 DIAGNOSIS — F4310 Post-traumatic stress disorder, unspecified: Secondary | ICD-10-CM | POA: Diagnosis not present

## 2022-10-24 DIAGNOSIS — G4700 Insomnia, unspecified: Secondary | ICD-10-CM | POA: Diagnosis not present

## 2022-10-24 DIAGNOSIS — F32A Depression, unspecified: Secondary | ICD-10-CM | POA: Diagnosis not present

## 2022-10-24 MED ORDER — VILAZODONE HCL 20 MG PO TABS
20.0000 mg | ORAL_TABLET | Freq: Every day | ORAL | 1 refills | Status: DC
Start: 2022-10-24 — End: 2023-01-06

## 2022-10-24 MED ORDER — ALPRAZOLAM 0.5 MG PO TABS
ORAL_TABLET | ORAL | 2 refills | Status: DC
Start: 2022-11-25 — End: 2023-01-06

## 2022-10-24 MED ORDER — DOXAZOSIN MESYLATE 4 MG PO TABS
4.0000 mg | ORAL_TABLET | Freq: Every day | ORAL | 1 refills | Status: DC
Start: 2022-10-24 — End: 2023-01-06

## 2022-10-24 NOTE — Progress Notes (Signed)
[name] [id number] 1960-12-21 62 y.o.  Virtual Visit via Telephone Note  I connected with pt on 10/24/22 at  1:00 PM EDT by telephone and verified that I am speaking with the correct person using two identifiers.   I discussed the limitations, risks, security and privacy concerns of performing an evaluation and management service by telephone and the availability of in person appointments. I also discussed with the patient that there may be a patient responsible charge related to this service. The patient expressed understanding and agreed to proceed.   I discussed the assessment and treatment plan with the patient. The patient was provided an opportunity to ask questions and all were answered. The patient agreed with the plan and demonstrated an understanding of the instructions.   The patient was advised to call back or seek an in-person evaluation if the symptoms worsen or if the condition fails to improve as anticipated.  I provided 40 minutes of non-face-to-face time during this encounter.  The patient was located at home.  The provider was located at [facility].   [name], PMHNP   Subjective:   Patient ID:  [name] is a 62 y.o. (DOB Nov 12, 1960) female.  Chief Complaint:  Chief Complaint  Patient presents with   Follow-up    Anxiety, depression, insomnia    HPI [name] presents for follow-up of depression, anxiety, and insomnia.   She had knee replacement surgery. She reports, "it's been a very slow healing process" and has had significant pain. She has been receiving physical therapy. She reports that pain is disrupting sleep at times. She is taking Alprazolam and Dayvigo to sleep. She reports that the last couple of times she has had PT she has taken Xanax and this has been helpful for her anxiety, muscle tension, and ability to engage more in therapy. She reports that she has had increased anxiety with PT due to pain. She reports,  "I am pushing myself" to do PT exercises.   She reports energy and motivation have been good towards recovering from surgery. She reports that her mood has been sad at times in response to limited mobility and social interaction for almost a year. She reports that concentration has been ok and has been able to focus on Bible study. She reports decreased appetite and that many things do not taste good to her. She reports that she has lost some weight. Denies SI.   She and her husband have seen marital counselor a couple of times.  Reports daughter withdrew from graduate school. She reports that she has been sad about daughter withdrawing from graduate school.  Dayvigo last filled 10/07/22. Gabapentin last filled 10/07/22. Alprazolam last 09/30/22.   Past Psychiatric Medication Trials: Ambien- Has been taking 5-10 mg Intermezzo- Helpful for sleep initiation Xanax Effexor XR-Multiple adverse effects. Severe discontinuation s/s. Zonisamide- SI. Lexapro-Concentration difficulty Viibryd Elavil- Apatheric, difficulty with work Topamax- Anger, irritability Benadryl- partially effective.  Dayvigo- Effective. Has mild drowsiness the following day.  Doxepin- Excessive sedation Doxazosin Gabapentin  Review of Systems:  Review of Systems  Musculoskeletal:  Positive for gait problem.       Pain in hand  Neurological:  Negative for tremors.  Psychiatric/Behavioral:         Please refer to HPI    Medications: I have reviewed the patient's current medications.  Current Outpatient Medications  Medication Sig Dispense Refill   atorvastatin (LIPITOR) 20 MG tablet Take 1 tablet (20 mg total) by mouth daily.  90 tablet 3   famotidine-calcium carbonate-magnesium hydroxide (PEPCID COMPLETE) 10-800-165 MG chewable tablet Chew 1 tablet by mouth daily as needed (with aleve for pain.).     gabapentin (NEURONTIN) 100 MG capsule Take 1 capsule (100 mg total) by mouth 2 (two) times daily as needed (anxiety).  (Patient taking differently: Take 200 mg by mouth in the morning.) 180 capsule 1   gabapentin (NEURONTIN) 300 MG capsule Take 1 capsule (300 mg total) by mouth at bedtime. 90 capsule 1   Galcanezumab-gnlm 120 MG/ML SOAJ Inject 120 mg into the skin every 30 (thirty) days.     GOODSENSE PSYLLIUM FIBER PO Take 4 capsules by mouth every Monday, Tuesday, Wednesday, Thursday, and Friday.     hydrochlorothiazide (MICROZIDE) 12.5 MG capsule Take 12.5 mg by mouth in the morning.     JARDIANCE 10 MG TABS tablet Take 10 mg by mouth in the morning.     Lemborexant (DAYVIGO) 10 MG TABS Take 1 tablet (10 mg total) by mouth at bedtime. 90 tablet 1   metFORMIN (GLUCOPHAGE) 500 MG tablet Take 500 mg by mouth daily with supper.     methocarbamol (ROBAXIN) 500 MG tablet Take 1 tablet (500 mg total) by mouth every 6 (six) hours as needed for muscle spasms. 40 tablet 0   metoprolol succinate (TOPROL-XL) 100 MG 24 hr tablet Take 100 mg by mouth at bedtime.     mometasone (ELOCON) 0.1 % lotion 5 drops daily. Instill 5 drops into right ear daily     OnabotulinumtoxinA (BOTOX IM) Inject 1 Dose into the muscle every 3 (three) months. FOR MIGRAINES     oxyCODONE (OXY IR/ROXICODONE) 5 MG immediate release tablet Take 1-2 tablets (5-10 mg total) by mouth every 6 (six) hours as needed for severe pain. 42 tablet 0   rizatriptan (MAXALT-MLT) 10 MG disintegrating tablet Take 10 mg by mouth as needed for migraine.      Tetrahydrozoline HCl (VISINE OP) Place 1 drop into both eyes daily as needed (irritation).     thyroid (ARMOUR) 30 MG tablet Take 30 mg by mouth daily before breakfast.      traMADol (ULTRAM) 50 MG tablet Take 1-2 tablets (50-100 mg total) by mouth every 6 (six) hours as needed for moderate pain. 40 tablet 0   UBRELVY 50 MG TABS Take 50 mg by mouth daily as needed (migraine).     [START ON 11/25/2022] ALPRAZolam (XANAX) 0.5 MG tablet Take 1/2-1 tablet three times daily as needed for anxiety or insomnia 75 tablet 2    doxazosin (CARDURA) 4 MG tablet Take 1 tablet (4 mg total) by mouth at bedtime. 90 tablet 1   Naphazoline-Pheniramine (OPCON-A) 0.027-0.315 % SOLN Place 1-2 drops into both eyes 3 (three) times daily as needed (irritated/allergy eyes). (Patient not taking: Reported on 10/24/2022)     ondansetron (ZOFRAN) 4 MG tablet Take 1 tablet (4 mg total) by mouth every 6 (six) hours as needed for nausea. 20 tablet 0   Vilazodone HCl 20 MG TABS Take 1 tablet (20 mg total) by mouth daily with breakfast. 90 tablet 1   No current facility-administered medications for this visit.   Facility-Administered Medications Ordered in Other Visits  Medication Dose Route Frequency Provider Last Rate Last Admin   ondansetron (ZOFRAN) 4 mg in sodium chloride 0.9 % 50 mL IVPB  4 mg Intravenous Q6H PRN Barnett Abu, MD        Medication Side Effects: None  Allergies:  Allergies  Allergen Reactions  Effexor [Venlafaxine Hcl]     Weight loss and cant sleep   Effexor [Venlafaxine] Other (See Comments)    Other Reaction(s): anxiety/ extreme weight loss   Flexeril [Cyclobenzaprine] Hives and Other (See Comments)   Linzess [Linaclotide] Diarrhea    Past Medical History:  Diagnosis Date   Arthritis    Back pain of lumbar region with sciatica    Cancer (HCC)    breast   Depression    Diabetes mellitus, type II (HCC)    Family history of anesthesia complication    grandfather died under anesthesia 70's- had heart issues   H/O carpal tunnel repair 2014   Headache(784.0)    Hyperlipidemia    Hypertension    Hypothyroidism    Medial meniscus tear    PONV (postoperative nausea and vomiting)    Sleep apnea    cpap since 10    Family History  Problem Relation Age of Onset   Hyperlipidemia Mother    Heart disease Father    Hypertension Father    Diabetes Father    Cancer Maternal Aunt        Breast - In 92's   Cancer Paternal Aunt        Breast - in 75's   Cancer Maternal Grandmother        breast cancer    Diabetes Paternal Grandmother    Heart disease Paternal Grandfather    Hypertension Paternal Grandfather    Suicidality Other    Sleep apnea Neg Hx     Social History   Socioeconomic History   Marital status: Married    Spouse name: Rosanne Ashing   Number of children: 3   Years of education: college   Highest education level: Not on file  Occupational History   Occupation: N/A  Tobacco Use   Smoking status: Never   Smokeless tobacco: Never   Tobacco comments:    occ alcohol  Vaping Use   Vaping status: Never Used  Substance and Sexual Activity   Alcohol use: Yes    Alcohol/week: 1.0 standard drink of alcohol    Types: 1 Cans of beer per week    Comment: occ   Drug use: No   Sexual activity: Not Currently  Other Topics Concern   Not on file  Social History Narrative   Denies caffeine use    Social Determinants of Health   Financial Resource Strain: Not on file  Food Insecurity: No Food Insecurity (09/09/2022)   Hunger Vital Sign    Worried About Running Out of Food in the Last Year: Never true    Ran Out of Food in the Last Year: Never true  Transportation Needs: No Transportation Needs (09/09/2022)   PRAPARE - Administrator, Civil Service (Medical): No    Lack of Transportation (Non-Medical): No  Physical Activity: Not on file  Stress: Not on file  Social Connections: Unknown (06/04/2021)   [facility]   Social Network    Social Network: Not on file  Intimate Partner Violence: Not At Risk (09/09/2022)   Humiliation, Afraid, Rape, and Kick questionnaire    Fear of Current or Ex-Partner: No    Emotionally Abused: No    Physically Abused: No    Sexually Abused: No    Past Medical History, Surgical history, Social history, and Family history were reviewed and updated as appropriate.   Please see review of systems for further details on the patient's review from today.  Objective:   Physical Exam:  LMP 11/12/2013    Physical Exam Neurological:     Mental Status: She is alert and oriented to person, place, and time.     Cranial Nerves: No dysarthria.  Psychiatric:        Attention and Perception: Attention and perception normal.        Speech: Speech normal.        Behavior: Behavior is cooperative.        Thought Content: Thought content normal. Thought content is not paranoid or delusional. Thought content does not include homicidal or suicidal ideation. Thought content does not include homicidal or suicidal plan.        Cognition and Memory: Cognition and memory normal.        Judgment: Judgment normal.     Comments: Insight intact Anxiety in response to health issues     Lab Review:     Component Value Date/Time   NA 136 09/10/2022 0326   K 3.7 09/10/2022 0326   CL 104 09/10/2022 0326   CO2 23 09/10/2022 0326   GLUCOSE 163 (H) 09/10/2022 0326   BUN 12 09/10/2022 0326   CREATININE 0.57 09/10/2022 0326   CREATININE 0.73 01/19/2018 1556   CALCIUM 8.5 (L) 09/10/2022 0326   PROT 6.8 01/19/2018 1556   ALBUMIN 3.6 01/19/2018 1556   AST 15 01/19/2018 1556   ALT 15 01/19/2018 1556   ALKPHOS 53 01/19/2018 1556   BILITOT 0.3 01/19/2018 1556   GFRNONAA >60 09/10/2022 0326   GFRNONAA >60 01/19/2018 1556   GFRAA >60 01/19/2018 1556       Component Value Date/Time   WBC 12.6 (H) 09/10/2022 0326   RBC 4.54 09/10/2022 0326   HGB 12.8 09/10/2022 0326   HGB 12.2 01/19/2018 1556   HCT 40.0 09/10/2022 0326   PLT 203 09/10/2022 0326   PLT 263 01/19/2018 1556   MCV 88.1 09/10/2022 0326   MCH 28.2 09/10/2022 0326   MCHC 32.0 09/10/2022 0326   RDW 13.7 09/10/2022 0326   LYMPHSABS 2.3 01/19/2018 1556   MONOABS 0.6 01/19/2018 1556   EOSABS 0.6 (H) 01/19/2018 1556   BASOSABS 0.1 01/19/2018 1556    No results found for: "POCLITH", "LITHIUM"   No results found for: "PHENYTOIN", "PHENOBARB", "VALPROATE", "CBMZ"   .res Assessment: Plan:    Will continue current plan of care since target  signs and symptoms are well controlled without any tolerability issues. Pt to follow-up in 3 months or sooner if clinically indicated.  Patient advised to contact office with any questions, adverse effects, or acute worsening in signs and symptoms.   Vernetta was seen today for follow-up.  Diagnoses and all orders for this visit:  Generalized anxiety disorder -     ALPRAZolam (XANAX) 0.5 MG tablet; Take 1/2-1 tablet three times daily as needed for anxiety or insomnia -     Vilazodone HCl 20 MG TABS; Take 1 tablet (20 mg total) by mouth daily with breakfast.  PTSD (post-traumatic stress disorder) -     ALPRAZolam (XANAX) 0.5 MG tablet; Take 1/2-1 tablet three times daily as needed for anxiety or insomnia -     Vilazodone HCl 20 MG TABS; Take 1 tablet (20 mg total) by mouth daily with breakfast.  Insomnia, unspecified type -     doxazosin (CARDURA) 4 MG tablet; Take 1 tablet (4 mg total) by mouth at bedtime.  Depression, unspecified depression type -     Vilazodone HCl 20 MG TABS; Take 1  tablet (20 mg total) by mouth daily with breakfast.    Please see After Visit Summary for patient specific instructions.  Future Appointments  Date Time Provider Department Center  10/25/2022 11:00 AM Jearld Lesch, PT [facility]  10/28/2022  4:15 PM Cassie Freer, PT [facility]  10/30/2022 11:30 AM Butch Penny, NP [facility] None  10/30/2022  4:15 PM Cassie Freer, PT [facility]  11/01/2022 11:00 AM Cassie Freer, PT [facility]  11/04/2022  4:15 PM Jearld Lesch, PT [facility]  11/06/2022  9:30 AM Jearld Lesch, PT [facility]  11/06/2022  2:00 PM Gaspar Bidding, [facility] None  11/08/2022 11:00 AM Cassie Freer, PT [facility]  11/11/2022  4:15 PM Jearld Lesch, PT [facility]  11/13/2022  4:15 PM Jearld Lesch, PT [facility]  11/15/2022 11:00 AM Cassie Freer, PT [facility]  11/18/2022  5:00 PM Jearld Lesch, PT [facility]  11/20/2022  5:00 PM Jearld Lesch, PT [facility]  11/22/2022 11:00 AM Cassie Freer, PT [facility]  11/25/2022  5:00 PM Jearld Lesch, PT [facility]  11/27/2022  5:00 PM Jearld Lesch, PT [facility]  11/29/2022 11:00 AM Cassie Freer, PT [facility]  12/02/2022  5:00 PM Jearld Lesch, PT [facility]  12/03/2022  3:00 PM Gaspar Bidding, [facility] None  12/04/2022  5:00 PM Jearld Lesch, PT [facility]  12/06/2022 11:00 AM Cassie Freer, PT [facility]  12/09/2022  5:00 PM Jearld Lesch, PT [facility]  12/11/2022  5:00 PM Jearld Lesch, PT [facility]  12/13/2022 11:00 AM Cassie Freer, PT [facility]  12/16/2022  5:00 PM Jearld Lesch, PT [facility]  12/17/2022  3:00 PM Gaspar Bidding, [facility] None  12/17/2022  5:00 PM Jearld Lesch, PT [facility]  12/18/2022  5:00 PM Jearld Lesch, PT [facility]  12/23/2022  5:00 PM Jearld Lesch, PT [facility]  12/31/2022  4:00 PM Gaspar Bidding, [facility] None  01/21/2023  4:00 PM Gaspar Bidding, [facility] None    No orders of the defined types were placed in this encounter.     -------------------------------

## 2022-10-25 ENCOUNTER — Ambulatory Visit: Payer: No Typology Code available for payment source | Admitting: Physical Therapy

## 2022-10-25 ENCOUNTER — Encounter: Payer: Self-pay | Admitting: Physical Therapy

## 2022-10-25 DIAGNOSIS — M25661 Stiffness of right knee, not elsewhere classified: Secondary | ICD-10-CM

## 2022-10-25 DIAGNOSIS — R2689 Other abnormalities of gait and mobility: Secondary | ICD-10-CM

## 2022-10-25 DIAGNOSIS — R600 Localized edema: Secondary | ICD-10-CM

## 2022-10-25 DIAGNOSIS — M25561 Pain in right knee: Secondary | ICD-10-CM

## 2022-10-25 DIAGNOSIS — Z96651 Presence of right artificial knee joint: Secondary | ICD-10-CM

## 2022-10-25 DIAGNOSIS — M6281 Muscle weakness (generalized): Secondary | ICD-10-CM

## 2022-10-25 NOTE — Therapy (Signed)
OUTPATIENT PHYSICAL THERAPY LOWER EXTREMITY TREATMENT    Patient Name: [name] MRN: [id number] DOB:06-Nov-1960, 62 y.o., female Today's Date: 10/25/2022  END OF SESSION:  PT End of Session - 10/25/22 1104     Visit Number 17    Date for PT Re-Evaluation 12/05/22    Authorization Type U[employer]    PT Start Time 1100    PT Stop Time 1145    PT Time Calculation (min) 45 min    Activity Tolerance Patient limited by pain;Patient tolerated treatment well    Behavior During Therapy [facility] for tasks assessed/performed                    Past Medical History:  Diagnosis Date   Arthritis    Back pain of lumbar region with sciatica    Cancer (HCC)    breast   Depression    Diabetes mellitus, type II (HCC)    Family history of anesthesia complication    grandfather died under anesthesia 70's- had heart issues   H/O carpal tunnel repair 2014   Headache(784.0)    Hyperlipidemia    Hypertension    Hypothyroidism    Medial meniscus tear    PONV (postoperative nausea and vomiting)    Sleep apnea    cpap since 10   Past Surgical History:  Procedure Laterality Date   BILATERAL TOTAL MASTECTOMY WITH AXILLARY LYMPH NODE DISSECTION     BREAST RECONSTRUCTION     CERVICAL DISC ARTHROPLASTY N/A 03/19/2013   Procedure: CERVICAL FIVE TO SIX, CERVICAL SIX TO SEVEN CERVICAL ANTERIOR DISC ARTHROPLASTY;  Surgeon: Barnett Abu, MD;  Location: [facility];  Service: Neurosurgery;  Laterality: N/A;  C56 C67 artificial disc replacement   CESAREAN SECTION     COLONOSCOPY     DILATATION & CURETTAGE/HYSTEROSCOPY WITH TRUECLEAR N/A 11/25/2013   Procedure: DILATATION & CURETTAGE/HYSTEROSCOPY WITH TRUCLEAR;  Surgeon: Meriel Pica, MD;  Location: [facility];  Service: Gynecology;  Laterality: N/A;   ECTOPIC PREGNANCY SURGERY     GALLBLADDER SURGERY     MOUTH SURGERY     child- dog bite   SPINAL FUSION     2023   STERIOD INJECTION Left 09/09/2022   Procedure: LEFT KNEE STEROID INJECTION;   Surgeon: Ollen Gross, MD;  Location: [facility];  Service: Orthopedics;  Laterality: Left;  left knee injection 0928   TONSILLECTOMY     TOTAL KNEE ARTHROPLASTY Right 09/09/2022   Procedure: TOTAL KNEE ARTHROPLASTY;  Surgeon: Ollen Gross, MD;  Location: [facility];  Service: Orthopedics;  Laterality: Right;   TUBAL LIGATION     Patient Active Problem List   Diagnosis Date Noted   OA (osteoarthritis) of knee 09/09/2022   Exertional dyspnea 04/17/2022   Elevated coronary artery calcium score 04/17/2022   Spondylolisthesis at L4-L5 level 12/27/2021   Ductal carcinoma in situ (DCIS) of left breast 01/19/2018   Generalized anxiety disorder 11/07/2017   Depression 11/07/2017   Insomnia 11/07/2017   Cervical spondylosis 03/19/2013    PCP: Creola Corn  REFERRING PROVIDER: Trudee Grip  REFERRING DIAG: R TKA 09/09/22  THERAPY DIAG:  S/P total knee arthroplasty, right  Acute pain of right knee  Stiffness of right knee, not elsewhere classified  Muscle weakness (generalized)  Other abnormalities of gait and mobility  Localized edema  Rationale for Evaluation and Treatment: Rehabilitation  ONSET DATE: 09/09/22  SUBJECTIVE:   SUBJECTIVE STATEMENT :  PAtient in better spirits feels like it is moving a little better, just  5/5. Baseline: 2/5 Goal status: IN PROGRESS 3+/5 10/09/22  5.  Patient will be able to ambulate 500' with LRAD and normal gait pattern without increased pain to access community.  Baseline: using RW small in home ambulation distances Goal status: IN PROGRESS 10/09/22  6.  Patient will report 64 on FOTO (patient reported outcome measure) to demonstrate improved functional ability. Baseline: 4 Goal status: INITIAL  PLAN:  PT FREQUENCY: 2x/week  PT DURATION: 12 weeks  PLANNED INTERVENTIONS: Therapeutic exercises, Therapeutic activity, Neuromuscular re-education, Balance training, Gait training, Patient/Family education, Self Care, Joint mobilization, Stair training, Electrical stimulation, Cryotherapy, Moist heat, scar mobilization, Vasopneumatic device, and Manual therapy  PLAN FOR NEXT SESSION: will continue to alter plan and try to gain her ROM Measure next visit  Patient Details  Name: [name] MRN: [id number] Date of Birth: 01/24/1960 Referring Provider:  Ollen Gross, MD  Encounter Date: 10/25/2022   Jearld Lesch, PT 10/25/2022, 11:05 AM  5/5. Baseline: 2/5 Goal status: IN PROGRESS 3+/5 10/09/22  5.  Patient will be able to ambulate 500' with LRAD and normal gait pattern without increased pain to access community.  Baseline: using RW small in home ambulation distances Goal status: IN PROGRESS 10/09/22  6.  Patient will report 64 on FOTO (patient reported outcome measure) to demonstrate improved functional ability. Baseline: 4 Goal status: INITIAL  PLAN:  PT FREQUENCY: 2x/week  PT DURATION: 12 weeks  PLANNED INTERVENTIONS: Therapeutic exercises, Therapeutic activity, Neuromuscular re-education, Balance training, Gait training, Patient/Family education, Self Care, Joint mobilization, Stair training, Electrical stimulation, Cryotherapy, Moist heat, scar mobilization, Vasopneumatic device, and Manual therapy  PLAN FOR NEXT SESSION: will continue to alter plan and try to gain her ROM Measure next visit  Patient Details  Name: [name] MRN: [id number] Date of Birth: 01/24/1960 Referring Provider:  Ollen Gross, MD  Encounter Date: 10/25/2022   Jearld Lesch, PT 10/25/2022, 11:05 AM  5/5. Baseline: 2/5 Goal status: IN PROGRESS 3+/5 10/09/22  5.  Patient will be able to ambulate 500' with LRAD and normal gait pattern without increased pain to access community.  Baseline: using RW small in home ambulation distances Goal status: IN PROGRESS 10/09/22  6.  Patient will report 64 on FOTO (patient reported outcome measure) to demonstrate improved functional ability. Baseline: 4 Goal status: INITIAL  PLAN:  PT FREQUENCY: 2x/week  PT DURATION: 12 weeks  PLANNED INTERVENTIONS: Therapeutic exercises, Therapeutic activity, Neuromuscular re-education, Balance training, Gait training, Patient/Family education, Self Care, Joint mobilization, Stair training, Electrical stimulation, Cryotherapy, Moist heat, scar mobilization, Vasopneumatic device, and Manual therapy  PLAN FOR NEXT SESSION: will continue to alter plan and try to gain her ROM Measure next visit  Patient Details  Name: [name] MRN: [id number] Date of Birth: 01/24/1960 Referring Provider:  Ollen Gross, MD  Encounter Date: 10/25/2022   Jearld Lesch, PT 10/25/2022, 11:05 AM  OUTPATIENT PHYSICAL THERAPY LOWER EXTREMITY TREATMENT    Patient Name: [name] MRN: [id number] DOB:06-Nov-1960, 62 y.o., female Today's Date: 10/25/2022  END OF SESSION:  PT End of Session - 10/25/22 1104     Visit Number 17    Date for PT Re-Evaluation 12/05/22    Authorization Type [employer]    PT Start Time 1100    PT Stop Time 1145    PT Time Calculation (min) 45 min    Activity Tolerance Patient limited by pain;Patient tolerated treatment well    Behavior During Therapy [facility] for tasks assessed/performed                    Past Medical History:  Diagnosis Date   Arthritis    Back pain of lumbar region with sciatica    Cancer (HCC)    breast   Depression    Diabetes mellitus, type II (HCC)    Family history of anesthesia complication    grandfather died under anesthesia 70's- had heart issues   H/O carpal tunnel repair 2014   Headache(784.0)    Hyperlipidemia    Hypertension    Hypothyroidism    Medial meniscus tear    PONV (postoperative nausea and vomiting)    Sleep apnea    cpap since 10   Past Surgical History:  Procedure Laterality Date   BILATERAL TOTAL MASTECTOMY WITH AXILLARY LYMPH NODE DISSECTION     BREAST RECONSTRUCTION     CERVICAL DISC ARTHROPLASTY N/A 03/19/2013   Procedure: CERVICAL FIVE TO SIX, CERVICAL SIX TO SEVEN CERVICAL ANTERIOR DISC ARTHROPLASTY;  Surgeon: Barnett Abu, MD;  Location: [facility];  Service: Neurosurgery;  Laterality: N/A;  C56 C67 artificial disc replacement   CESAREAN SECTION     COLONOSCOPY     DILATATION & CURETTAGE/HYSTEROSCOPY WITH TRUECLEAR N/A 11/25/2013   Procedure: DILATATION & CURETTAGE/HYSTEROSCOPY WITH TRUCLEAR;  Surgeon: Meriel Pica, MD;  Location: [facility];  Service: Gynecology;  Laterality: N/A;   ECTOPIC PREGNANCY SURGERY     GALLBLADDER SURGERY     MOUTH SURGERY     child- dog bite   SPINAL FUSION     2023   STERIOD INJECTION Left 09/09/2022   Procedure: LEFT KNEE STEROID INJECTION;   Surgeon: Ollen Gross, MD;  Location: [facility];  Service: Orthopedics;  Laterality: Left;  left knee injection 0928   TONSILLECTOMY     TOTAL KNEE ARTHROPLASTY Right 09/09/2022   Procedure: TOTAL KNEE ARTHROPLASTY;  Surgeon: Ollen Gross, MD;  Location: [facility];  Service: Orthopedics;  Laterality: Right;   TUBAL LIGATION     Patient Active Problem List   Diagnosis Date Noted   OA (osteoarthritis) of knee 09/09/2022   Exertional dyspnea 04/17/2022   Elevated coronary artery calcium score 04/17/2022   Spondylolisthesis at L4-L5 level 12/27/2021   Ductal carcinoma in situ (DCIS) of left breast 01/19/2018   Generalized anxiety disorder 11/07/2017   Depression 11/07/2017   Insomnia 11/07/2017   Cervical spondylosis 03/19/2013    PCP: Creola Corn  REFERRING PROVIDER: Trudee Grip  REFERRING DIAG: R TKA 09/09/22  THERAPY DIAG:  S/P total knee arthroplasty, right  Acute pain of right knee  Stiffness of right knee, not elsewhere classified  Muscle weakness (generalized)  Other abnormalities of gait and mobility  Localized edema  Rationale for Evaluation and Treatment: Rehabilitation  ONSET DATE: 09/09/22  SUBJECTIVE:   SUBJECTIVE STATEMENT :  PAtient in better spirits feels like it is moving a little better, just

## 2022-10-28 ENCOUNTER — Ambulatory Visit: Payer: No Typology Code available for payment source

## 2022-10-28 ENCOUNTER — Ambulatory Visit: Payer: No Typology Code available for payment source | Admitting: Professional Counselor

## 2022-10-28 DIAGNOSIS — M25561 Pain in right knee: Secondary | ICD-10-CM

## 2022-10-28 DIAGNOSIS — Z96651 Presence of right artificial knee joint: Secondary | ICD-10-CM | POA: Diagnosis not present

## 2022-10-28 DIAGNOSIS — R2689 Other abnormalities of gait and mobility: Secondary | ICD-10-CM

## 2022-10-28 DIAGNOSIS — M25661 Stiffness of right knee, not elsewhere classified: Secondary | ICD-10-CM

## 2022-10-28 DIAGNOSIS — M6281 Muscle weakness (generalized): Secondary | ICD-10-CM

## 2022-10-28 DIAGNOSIS — R600 Localized edema: Secondary | ICD-10-CM

## 2022-10-28 NOTE — Therapy (Signed)
will report at least 75% improvement in R knee pain to improve QOL. Baseline: 10/10 Goal status: IN PROGRESS   3.  Patient will demonstrate improved R knee AROM to >/= 0-120 deg to allow for normal gait and stair mechanics. Baseline: 30-0-60 10/09/22: 16-0-78  Goal status: IN PROGRESS  4.  Patient will demonstrate improved functional LE strength as demonstrated by 5/5. Baseline: 2/5 Goal status: IN PROGRESS 3+/5 10/09/22  5.  Patient will be able to ambulate 500' with LRAD and normal gait pattern without increased pain to access community.  Baseline: using RW small in home ambulation distances Goal status: IN PROGRESS 10/09/22  6.  Patient will report 37 on FOTO (patient reported outcome measure) to demonstrate improved functional ability. Baseline: 4 Goal status: INITIAL  PLAN:  PT FREQUENCY: 2x/week  PT DURATION: 12 weeks  PLANNED INTERVENTIONS: Therapeutic exercises, Therapeutic activity, Neuromuscular re-education, Balance training, Gait training, Patient/Family education, Self Care, Joint mobilization, Stair training, Electrical stimulation, Cryotherapy, Moist heat, scar mobilization, Vasopneumatic device, and Manual therapy  PLAN FOR NEXT SESSION: will continue to alter plan and try to gain her ROM Measure next visit  Patient Details  Name: [name] MRN: [id number] Date of Birth: [birth year] Referring Provider:  Ollen Gross, MD  Encounter  Date: 10/28/2022   Cassie Freer, PT 10/28/2022, 5:04 PM  OUTPATIENT PHYSICAL THERAPY LOWER EXTREMITY TREATMENT    Patient Name: [name] MRN: [id number] DOB:Jun 28, 1960, 62 y.o., female Today's Date: 10/28/2022  END OF SESSION:  PT End of Session - 10/28/22 1614     Visit Number 18    Date for PT Re-Evaluation 12/05/22    Authorization Type UHC    PT Start Time 1615    PT Stop Time 1700    PT Time Calculation (min) 45 min    Activity Tolerance Patient limited by pain;Patient tolerated treatment well    Behavior During Therapy [facility] for tasks assessed/performed                     Past Medical History:  Diagnosis Date   Arthritis    Back pain of lumbar region with sciatica    Cancer (HCC)    breast   Depression    Diabetes mellitus, type II (HCC)    Family history of anesthesia complication    grandfather died under anesthesia 70's- had heart issues   H/O carpal tunnel repair 2014   Headache(784.0)    Hyperlipidemia    Hypertension    Hypothyroidism    Medial meniscus tear    PONV (postoperative nausea and vomiting)    Sleep apnea    cpap since 10   Past Surgical History:  Procedure Laterality Date   BILATERAL TOTAL MASTECTOMY WITH AXILLARY LYMPH NODE DISSECTION     BREAST RECONSTRUCTION     CERVICAL DISC ARTHROPLASTY N/A 03/19/2013   Procedure: CERVICAL FIVE TO SIX, CERVICAL SIX TO SEVEN CERVICAL ANTERIOR DISC ARTHROPLASTY;  Surgeon: Barnett Abu, MD;  Location: [facility];  Service: Neurosurgery;  Laterality: N/A;  C56 C67 artificial disc replacement   CESAREAN SECTION     COLONOSCOPY     DILATATION & CURETTAGE/HYSTEROSCOPY WITH TRUECLEAR N/A 11/25/2013   Procedure: DILATATION & CURETTAGE/HYSTEROSCOPY WITH TRUCLEAR;  Surgeon: Meriel Pica, MD;  Location: [facility];  Service: Gynecology;  Laterality: N/A;   ECTOPIC PREGNANCY SURGERY     GALLBLADDER SURGERY     MOUTH SURGERY     child- dog bite   SPINAL FUSION     2023   STERIOD INJECTION Left 09/09/2022   Procedure: LEFT KNEE STEROID INJECTION;   Surgeon: Ollen Gross, MD;  Location: [facility];  Service: Orthopedics;  Laterality: Left;  left knee injection 0928   TONSILLECTOMY     TOTAL KNEE ARTHROPLASTY Right 09/09/2022   Procedure: TOTAL KNEE ARTHROPLASTY;  Surgeon: Ollen Gross, MD;  Location: [facility];  Service: Orthopedics;  Laterality: Right;   TUBAL LIGATION     Patient Active Problem List   Diagnosis Date Noted   OA (osteoarthritis) of knee 09/09/2022   Exertional dyspnea 04/17/2022   Elevated coronary artery calcium score 04/17/2022   Spondylolisthesis at L4-L5 level 12/27/2021   Ductal carcinoma in situ (DCIS) of left breast 01/19/2018   Generalized anxiety disorder 11/07/2017   Depression 11/07/2017   Insomnia 11/07/2017   Cervical spondylosis 03/19/2013    PCP: Creola Corn  REFERRING PROVIDER: Trudee Grip  REFERRING DIAG: R TKA 09/09/22  THERAPY DIAG:  S/P total knee arthroplasty, right  Acute pain of right knee  Stiffness of right knee, not elsewhere classified  Muscle weakness (generalized)  Other abnormalities of gait and mobility  Localized edema  Rationale for Evaluation and Treatment: Rehabilitation  ONSET DATE: 09/09/22  SUBJECTIVE:   SUBJECTIVE STATEMENT :  PAtient in better spirits feels like it is moving a little better,  OUTPATIENT PHYSICAL THERAPY LOWER EXTREMITY TREATMENT    Patient Name: [name] MRN: [id number] DOB:Jun 28, 1960, 62 y.o., female Today's Date: 10/28/2022  END OF SESSION:  PT End of Session - 10/28/22 1614     Visit Number 18    Date for PT Re-Evaluation 12/05/22    Authorization Type UHC    PT Start Time 1615    PT Stop Time 1700    PT Time Calculation (min) 45 min    Activity Tolerance Patient limited by pain;Patient tolerated treatment well    Behavior During Therapy [facility] for tasks assessed/performed                     Past Medical History:  Diagnosis Date   Arthritis    Back pain of lumbar region with sciatica    Cancer (HCC)    breast   Depression    Diabetes mellitus, type II (HCC)    Family history of anesthesia complication    grandfather died under anesthesia 70's- had heart issues   H/O carpal tunnel repair 2014   Headache(784.0)    Hyperlipidemia    Hypertension    Hypothyroidism    Medial meniscus tear    PONV (postoperative nausea and vomiting)    Sleep apnea    cpap since 10   Past Surgical History:  Procedure Laterality Date   BILATERAL TOTAL MASTECTOMY WITH AXILLARY LYMPH NODE DISSECTION     BREAST RECONSTRUCTION     CERVICAL DISC ARTHROPLASTY N/A 03/19/2013   Procedure: CERVICAL FIVE TO SIX, CERVICAL SIX TO SEVEN CERVICAL ANTERIOR DISC ARTHROPLASTY;  Surgeon: Barnett Abu, MD;  Location: [facility];  Service: Neurosurgery;  Laterality: N/A;  C56 C67 artificial disc replacement   CESAREAN SECTION     COLONOSCOPY     DILATATION & CURETTAGE/HYSTEROSCOPY WITH TRUECLEAR N/A 11/25/2013   Procedure: DILATATION & CURETTAGE/HYSTEROSCOPY WITH TRUCLEAR;  Surgeon: Meriel Pica, MD;  Location: [facility];  Service: Gynecology;  Laterality: N/A;   ECTOPIC PREGNANCY SURGERY     GALLBLADDER SURGERY     MOUTH SURGERY     child- dog bite   SPINAL FUSION     2023   STERIOD INJECTION Left 09/09/2022   Procedure: LEFT KNEE STEROID INJECTION;   Surgeon: Ollen Gross, MD;  Location: [facility];  Service: Orthopedics;  Laterality: Left;  left knee injection 0928   TONSILLECTOMY     TOTAL KNEE ARTHROPLASTY Right 09/09/2022   Procedure: TOTAL KNEE ARTHROPLASTY;  Surgeon: Ollen Gross, MD;  Location: [facility];  Service: Orthopedics;  Laterality: Right;   TUBAL LIGATION     Patient Active Problem List   Diagnosis Date Noted   OA (osteoarthritis) of knee 09/09/2022   Exertional dyspnea 04/17/2022   Elevated coronary artery calcium score 04/17/2022   Spondylolisthesis at L4-L5 level 12/27/2021   Ductal carcinoma in situ (DCIS) of left breast 01/19/2018   Generalized anxiety disorder 11/07/2017   Depression 11/07/2017   Insomnia 11/07/2017   Cervical spondylosis 03/19/2013    PCP: Creola Corn  REFERRING PROVIDER: Trudee Grip  REFERRING DIAG: R TKA 09/09/22  THERAPY DIAG:  S/P total knee arthroplasty, right  Acute pain of right knee  Stiffness of right knee, not elsewhere classified  Muscle weakness (generalized)  Other abnormalities of gait and mobility  Localized edema  Rationale for Evaluation and Treatment: Rehabilitation  ONSET DATE: 09/09/22  SUBJECTIVE:   SUBJECTIVE STATEMENT :  PAtient in better spirits feels like it is moving a little better,  OUTPATIENT PHYSICAL THERAPY LOWER EXTREMITY TREATMENT    Patient Name: [name] MRN: [id number] DOB:Jun 28, 1960, 62 y.o., female Today's Date: 10/28/2022  END OF SESSION:  PT End of Session - 10/28/22 1614     Visit Number 18    Date for PT Re-Evaluation 12/05/22    Authorization Type UHC    PT Start Time 1615    PT Stop Time 1700    PT Time Calculation (min) 45 min    Activity Tolerance Patient limited by pain;Patient tolerated treatment well    Behavior During Therapy [facility] for tasks assessed/performed                     Past Medical History:  Diagnosis Date   Arthritis    Back pain of lumbar region with sciatica    Cancer (HCC)    breast   Depression    Diabetes mellitus, type II (HCC)    Family history of anesthesia complication    grandfather died under anesthesia 70's- had heart issues   H/O carpal tunnel repair 2014   Headache(784.0)    Hyperlipidemia    Hypertension    Hypothyroidism    Medial meniscus tear    PONV (postoperative nausea and vomiting)    Sleep apnea    cpap since 10   Past Surgical History:  Procedure Laterality Date   BILATERAL TOTAL MASTECTOMY WITH AXILLARY LYMPH NODE DISSECTION     BREAST RECONSTRUCTION     CERVICAL DISC ARTHROPLASTY N/A 03/19/2013   Procedure: CERVICAL FIVE TO SIX, CERVICAL SIX TO SEVEN CERVICAL ANTERIOR DISC ARTHROPLASTY;  Surgeon: Barnett Abu, MD;  Location: [facility];  Service: Neurosurgery;  Laterality: N/A;  C56 C67 artificial disc replacement   CESAREAN SECTION     COLONOSCOPY     DILATATION & CURETTAGE/HYSTEROSCOPY WITH TRUECLEAR N/A 11/25/2013   Procedure: DILATATION & CURETTAGE/HYSTEROSCOPY WITH TRUCLEAR;  Surgeon: Meriel Pica, MD;  Location: [facility];  Service: Gynecology;  Laterality: N/A;   ECTOPIC PREGNANCY SURGERY     GALLBLADDER SURGERY     MOUTH SURGERY     child- dog bite   SPINAL FUSION     2023   STERIOD INJECTION Left 09/09/2022   Procedure: LEFT KNEE STEROID INJECTION;   Surgeon: Ollen Gross, MD;  Location: [facility];  Service: Orthopedics;  Laterality: Left;  left knee injection 0928   TONSILLECTOMY     TOTAL KNEE ARTHROPLASTY Right 09/09/2022   Procedure: TOTAL KNEE ARTHROPLASTY;  Surgeon: Ollen Gross, MD;  Location: [facility];  Service: Orthopedics;  Laterality: Right;   TUBAL LIGATION     Patient Active Problem List   Diagnosis Date Noted   OA (osteoarthritis) of knee 09/09/2022   Exertional dyspnea 04/17/2022   Elevated coronary artery calcium score 04/17/2022   Spondylolisthesis at L4-L5 level 12/27/2021   Ductal carcinoma in situ (DCIS) of left breast 01/19/2018   Generalized anxiety disorder 11/07/2017   Depression 11/07/2017   Insomnia 11/07/2017   Cervical spondylosis 03/19/2013    PCP: Creola Corn  REFERRING PROVIDER: Trudee Grip  REFERRING DIAG: R TKA 09/09/22  THERAPY DIAG:  S/P total knee arthroplasty, right  Acute pain of right knee  Stiffness of right knee, not elsewhere classified  Muscle weakness (generalized)  Other abnormalities of gait and mobility  Localized edema  Rationale for Evaluation and Treatment: Rehabilitation  ONSET DATE: 09/09/22  SUBJECTIVE:   SUBJECTIVE STATEMENT :  PAtient in better spirits feels like it is moving a little better,  will report at least 75% improvement in R knee pain to improve QOL. Baseline: 10/10 Goal status: IN PROGRESS   3.  Patient will demonstrate improved R knee AROM to >/= 0-120 deg to allow for normal gait and stair mechanics. Baseline: 30-0-60 10/09/22: 16-0-78  Goal status: IN PROGRESS  4.  Patient will demonstrate improved functional LE strength as demonstrated by 5/5. Baseline: 2/5 Goal status: IN PROGRESS 3+/5 10/09/22  5.  Patient will be able to ambulate 500' with LRAD and normal gait pattern without increased pain to access community.  Baseline: using RW small in home ambulation distances Goal status: IN PROGRESS 10/09/22  6.  Patient will report 37 on FOTO (patient reported outcome measure) to demonstrate improved functional ability. Baseline: 4 Goal status: INITIAL  PLAN:  PT FREQUENCY: 2x/week  PT DURATION: 12 weeks  PLANNED INTERVENTIONS: Therapeutic exercises, Therapeutic activity, Neuromuscular re-education, Balance training, Gait training, Patient/Family education, Self Care, Joint mobilization, Stair training, Electrical stimulation, Cryotherapy, Moist heat, scar mobilization, Vasopneumatic device, and Manual therapy  PLAN FOR NEXT SESSION: will continue to alter plan and try to gain her ROM Measure next visit  Patient Details  Name: [name] MRN: [id number] Date of Birth: [birth year] Referring Provider:  Ollen Gross, MD  Encounter  Date: 10/28/2022   Cassie Freer, PT 10/28/2022, 5:04 PM

## 2022-10-28 NOTE — Progress Notes (Unsigned)
PATIENT: [name] DOB: Apr 20, 1960  REASON FOR VISIT: follow up HISTORY FROM: patient PRIMARY NEUROLOGIST:   Virtual Visit via Video Note  I connected with [name] on 10/28/22 at 11:30 AM EDT by a video enabled telemedicine application located remotely at [facility] and verified that I am speaking with the correct person using two identifiers who was located at their own home.   I discussed the limitations of evaluation and management by telemedicine and the availability of in person appointments. The patient expressed understanding and agreed to proceed.   PATIENT: [name] DOB: 1960-07-16  REASON FOR VISIT: follow up HISTORY FROM: patient  HISTORY OF PRESENT ILLNESS: Today 10/28/22  HISTORY   REVIEW OF SYSTEMS: Out of a complete 14 system review of symptoms, the patient complains only of the following symptoms, and all other reviewed systems are negative.  ALLERGIES: Allergies  Allergen Reactions   Effexor [Venlafaxine Hcl]     Weight loss and cant sleep   Effexor [Venlafaxine] Other (See Comments)    Other Reaction(s): anxiety/ extreme weight loss   Flexeril [Cyclobenzaprine] Hives and Other (See Comments)   Linzess [Linaclotide] Diarrhea    HOME MEDICATIONS: Outpatient Medications Prior to Visit  Medication Sig Dispense Refill   [START ON 11/25/2022] ALPRAZolam (XANAX) 0.5 MG tablet Take 1/2-1 tablet three times daily as needed for anxiety or insomnia 75 tablet 2   atorvastatin (LIPITOR) 20 MG tablet Take 1 tablet (20 mg total) by mouth daily. 90 tablet 3   doxazosin (CARDURA) 4 MG tablet Take 1 tablet (4 mg total) by mouth at bedtime. 90 tablet 1   famotidine-calcium carbonate-magnesium hydroxide (PEPCID COMPLETE) 10-800-165 MG chewable tablet Chew 1 tablet by mouth daily as needed (with aleve for pain.).     gabapentin (NEURONTIN) 100 MG capsule Take 1 capsule (100 mg total) by mouth 2 (two) times daily as needed  (anxiety). (Patient taking differently: Take 200 mg by mouth in the morning.) 180 capsule 1   gabapentin (NEURONTIN) 300 MG capsule Take 1 capsule (300 mg total) by mouth at bedtime. 90 capsule 1   Galcanezumab-gnlm 120 MG/ML SOAJ Inject 120 mg into the skin every 30 (thirty) days.     GOODSENSE PSYLLIUM FIBER PO Take 4 capsules by mouth every Monday, Tuesday, Wednesday, Thursday, and Friday.     hydrochlorothiazide (MICROZIDE) 12.5 MG capsule Take 12.5 mg by mouth in the morning.     JARDIANCE 10 MG TABS tablet Take 10 mg by mouth in the morning.     Lemborexant (DAYVIGO) 10 MG TABS Take 1 tablet (10 mg total) by mouth at bedtime. 90 tablet 1   metFORMIN (GLUCOPHAGE) 500 MG tablet Take 500 mg by mouth daily with supper.     methocarbamol (ROBAXIN) 500 MG tablet Take 1 tablet (500 mg total) by mouth every 6 (six) hours as needed for muscle spasms. 40 tablet 0   metoprolol succinate (TOPROL-XL) 100 MG 24 hr tablet Take 100 mg by mouth at bedtime.     mometasone (ELOCON) 0.1 % lotion 5 drops daily. Instill 5 drops into right ear daily     Naphazoline-Pheniramine (OPCON-A) 0.027-0.315 % SOLN Place 1-2 drops into both eyes 3 (three) times daily as needed (irritated/allergy eyes). (Patient not taking: Reported on 10/24/2022)     OnabotulinumtoxinA (BOTOX IM) Inject 1 Dose into the muscle every 3 (three) months. FOR MIGRAINES     ondansetron (ZOFRAN) 4 MG tablet Take 1 tablet (4 mg total)  by mouth every 6 (six) hours as needed for nausea. 20 tablet 0   oxyCODONE (OXY IR/ROXICODONE) 5 MG immediate release tablet Take 1-2 tablets (5-10 mg total) by mouth every 6 (six) hours as needed for severe pain. 42 tablet 0   rizatriptan (MAXALT-MLT) 10 MG disintegrating tablet Take 10 mg by mouth as needed for migraine.      Tetrahydrozoline HCl (VISINE OP) Place 1 drop into both eyes daily as needed (irritation).     thyroid (ARMOUR) 30 MG tablet Take 30 mg by mouth daily before breakfast.      traMADol (ULTRAM) 50  MG tablet Take 1-2 tablets (50-100 mg total) by mouth every 6 (six) hours as needed for moderate pain. 40 tablet 0   UBRELVY 50 MG TABS Take 50 mg by mouth daily as needed (migraine).     Vilazodone HCl 20 MG TABS Take 1 tablet (20 mg total) by mouth daily with breakfast. 90 tablet 1   Facility-Administered Medications Prior to Visit  Medication Dose Route Frequency Provider Last Rate Last Admin   ondansetron (ZOFRAN) 4 mg in sodium chloride 0.9 % 50 mL IVPB  4 mg Intravenous Q6H PRN Barnett Abu, MD        PAST MEDICAL HISTORY: Past Medical History:  Diagnosis Date   Arthritis    Back pain of lumbar region with sciatica    Cancer (HCC)    breast   Depression    Diabetes mellitus, type II (HCC)    Family history of anesthesia complication    grandfather died under anesthesia 70's- had heart issues   H/O carpal tunnel repair 2014   Headache(784.0)    Hyperlipidemia    Hypertension    Hypothyroidism    Medial meniscus tear    PONV (postoperative nausea and vomiting)    Sleep apnea    cpap since 10    PAST SURGICAL HISTORY: Past Surgical History:  Procedure Laterality Date   BILATERAL TOTAL MASTECTOMY WITH AXILLARY LYMPH NODE DISSECTION     BREAST RECONSTRUCTION     CERVICAL DISC ARTHROPLASTY N/A 03/19/2013   Procedure: CERVICAL FIVE TO SIX, CERVICAL SIX TO SEVEN CERVICAL ANTERIOR DISC ARTHROPLASTY;  Surgeon: Barnett Abu, MD;  Location: [facility];  Service: Neurosurgery;  Laterality: N/A;  C56 C67 artificial disc replacement   CESAREAN SECTION     COLONOSCOPY     DILATATION & CURETTAGE/HYSTEROSCOPY WITH TRUECLEAR N/A 11/25/2013   Procedure: DILATATION & CURETTAGE/HYSTEROSCOPY WITH TRUCLEAR;  Surgeon: Meriel Pica, MD;  Location: [facility];  Service: Gynecology;  Laterality: N/A;   ECTOPIC PREGNANCY SURGERY     GALLBLADDER SURGERY     MOUTH SURGERY     child- dog bite   SPINAL FUSION     2023   STERIOD INJECTION Left 09/09/2022   Procedure: LEFT KNEE STEROID  INJECTION;  Surgeon: Ollen Gross, MD;  Location: [facility];  Service: Orthopedics;  Laterality: Left;  left knee injection 0928   TONSILLECTOMY     TOTAL KNEE ARTHROPLASTY Right 09/09/2022   Procedure: TOTAL KNEE ARTHROPLASTY;  Surgeon: Ollen Gross, MD;  Location: [facility];  Service: Orthopedics;  Laterality: Right;   TUBAL LIGATION      FAMILY HISTORY: Family History  Problem Relation Age of Onset   Hyperlipidemia Mother    Heart disease Father    Hypertension Father    Diabetes Father    Cancer Maternal Aunt        Breast - In 52's   Cancer Paternal Aunt  Breast - in 40's   Cancer Maternal Grandmother        breast cancer   Diabetes Paternal Grandmother    Heart disease Paternal Grandfather    Hypertension Paternal Grandfather    Suicidality Other    Sleep apnea Neg Hx     SOCIAL HISTORY: Social History   Socioeconomic History   Marital status: Married    Spouse name: Rosanne Ashing   Number of children: 3   Years of education: college   Highest education level: Not on file  Occupational History   Occupation: N/A  Tobacco Use   Smoking status: Never   Smokeless tobacco: Never   Tobacco comments:    occ alcohol  Vaping Use   Vaping status: Never Used  Substance and Sexual Activity   Alcohol use: Yes    Alcohol/week: 1.0 standard drink of alcohol    Types: 1 Cans of beer per week    Comment: occ   Drug use: No   Sexual activity: Not Currently  Other Topics Concern   Not on file  Social History Narrative   Denies caffeine use    Social Determinants of Health   Financial Resource Strain: Not on file  Food Insecurity: No Food Insecurity (09/09/2022)   Hunger Vital Sign    Worried About Running Out of Food in the Last Year: Never true    Ran Out of Food in the Last Year: Never true  Transportation Needs: No Transportation Needs (09/09/2022)   PRAPARE - Administrator, Civil Service (Medical): No    Lack of Transportation (Non-Medical): No  Physical  Activity: Not on file  Stress: Not on file  Social Connections: Unknown (06/04/2021)   Received from [facility]   Social Network    Social Network: Not on file  Intimate Partner Violence: Not At Risk (09/09/2022)   Humiliation, Afraid, Rape, and Kick questionnaire    Fear of Current or Ex-Partner: No    Emotionally Abused: No    Physically Abused: No    Sexually Abused: No      PHYSICAL EXAM Generalized: Well developed, in no acute distress   Neurological examination  Mentation: Alert oriented to time, place, history taking. Follows all commands speech and language fluent Cranial nerve II-XII:Extraocular movements were full. Facial symmetry noted. uvula tongue midline. Head turning and shoulder shrug  were normal and symmetric. Motor: Good strength throughout subjectively per patient Sensory: Sensory testing is intact to soft touch on all 4 extremities subjectively per patient Coordination: Cerebellar testing reveals good finger-nose-finger  Gait and station: Patient is able to stand from a seated position. gait is normal.  Reflexes: UTA  DIAGNOSTIC DATA (LABS, IMAGING, TESTING) - I reviewed patient records, labs, notes, testing and imaging myself where available.  Lab Results  Component Value Date   WBC 12.6 (H) 09/10/2022   HGB 12.8 09/10/2022   HCT 40.0 09/10/2022   MCV 88.1 09/10/2022   PLT 203 09/10/2022      Component Value Date/Time   NA 136 09/10/2022 0326   K 3.7 09/10/2022 0326   CL 104 09/10/2022 0326   CO2 23 09/10/2022 0326   GLUCOSE 163 (H) 09/10/2022 0326   BUN 12 09/10/2022 0326   CREATININE 0.57 09/10/2022 0326   CREATININE 0.73 01/19/2018 1556   CALCIUM 8.5 (L) 09/10/2022 0326   PROT 6.8 01/19/2018 1556   ALBUMIN 3.6 01/19/2018 1556   AST 15 01/19/2018 1556   ALT 15 01/19/2018 1556  ALKPHOS 53 01/19/2018 1556   BILITOT 0.3 01/19/2018 1556   GFRNONAA >60 09/10/2022 0326   GFRNONAA >60 01/19/2018 1556   GFRAA >60 01/19/2018  1556   No results found for: "CHOL", "HDL", "LDLCALC", "LDLDIRECT", "TRIG", "CHOLHDL" Lab Results  Component Value Date   HGBA1C 5.9 (H) 09/09/2022   No results found for: "VITAMINB12" No results found for: "TSH"    ASSESSMENT AND PLAN 62 y.o. year old female  has a past medical history of Arthritis, Back pain of lumbar region with sciatica, Cancer (HCC), Depression, Diabetes mellitus, type II (HCC), Family history of anesthesia complication, H/O carpal tunnel repair (2014), Headache(784.0), Hyperlipidemia, Hypertension, Hypothyroidism, Medial meniscus tear, PONV (postoperative nausea and vomiting), and Sleep apnea. here with:  OSA on CPAP  CPAP compliance excellent Residual AHI is good Encouraged patient to continue using CPAP nightly and > 4 hours each night F/U in 1 year or sooner if needed  I spent 20 minutes of face-to-face and non-face-to-face time with patient.  This included previsit chart review, lab review, study review, order entry, electronic health record documentation, patient education.  Butch Penny, MSN, NP-C 10/28/2022, 2:40 PM [facility] [address] [place], [place] [address] [phone]

## 2022-10-29 ENCOUNTER — Encounter: Payer: Self-pay | Admitting: *Deleted

## 2022-10-30 ENCOUNTER — Encounter: Payer: No Typology Code available for payment source | Admitting: Adult Health

## 2022-10-30 ENCOUNTER — Ambulatory Visit: Payer: No Typology Code available for payment source

## 2022-10-30 DIAGNOSIS — Z96651 Presence of right artificial knee joint: Secondary | ICD-10-CM | POA: Diagnosis not present

## 2022-10-30 DIAGNOSIS — M6281 Muscle weakness (generalized): Secondary | ICD-10-CM

## 2022-10-30 DIAGNOSIS — M25661 Stiffness of right knee, not elsewhere classified: Secondary | ICD-10-CM

## 2022-10-30 DIAGNOSIS — R600 Localized edema: Secondary | ICD-10-CM

## 2022-10-30 DIAGNOSIS — M25561 Pain in right knee: Secondary | ICD-10-CM

## 2022-10-30 DIAGNOSIS — R2689 Other abnormalities of gait and mobility: Secondary | ICD-10-CM

## 2022-10-30 NOTE — Progress Notes (Signed)
This encounter was created in error - please disregard.

## 2022-10-30 NOTE — Progress Notes (Addendum)
Virtual Visit via Video Note  I connected with [name] on 11/05/22 at 11:30 AM EDT by a video enabled telemedicine application located remotely at [facility] and verified that I am speaking with the correct person using two identifiers who was located at their own home.   I discussed the limitations of evaluation and management by telemedicine and the availability of in person appointments. The patient expressed understanding and agreed to proceed.  PATIENT: [name] DOB: Apr 18, 1960  REASON FOR VISIT: follow up HISTORY FROM: patient PRIMARY NEUROLOGIST: Dr. Frances Furbish  HISTORY OF PRESENT ILLNESS: Today 10/31/22:  [name] is a 62 y.o. female with a history of OSA on CPAP. Returns today for follow-up.  Reports that new CPAP machine is working well.  She denies any new issues.  Her download is below       REVIEW OF SYSTEMS: Out of a complete 14 system review of symptoms, the patient complains only of the following symptoms, and all other reviewed systems are negative.  FSS ESS  ALLERGIES: Allergies  Allergen Reactions   Effexor [Venlafaxine Hcl]     Weight loss and cant sleep   Effexor [Venlafaxine] Other (See Comments)    Other Reaction(s): anxiety/ extreme weight loss   Flexeril [Cyclobenzaprine] Hives and Other (See Comments)   Linzess [Linaclotide] Diarrhea    HOME MEDICATIONS: Outpatient Medications Prior to Visit  Medication Sig Dispense Refill   [START ON 11/25/2022] ALPRAZolam (XANAX) 0.5 MG tablet Take 1/2-1 tablet three times daily as needed for anxiety or insomnia 75 tablet 2   atorvastatin (LIPITOR) 20 MG tablet Take 1 tablet (20 mg total) by mouth daily. 90 tablet 3   doxazosin (CARDURA) 4 MG tablet Take 1 tablet (4 mg total) by mouth at bedtime. 90 tablet 1   famotidine-calcium carbonate-magnesium hydroxide (PEPCID COMPLETE) 10-800-165 MG chewable tablet Chew 1 tablet by mouth daily as needed (with aleve for pain.).      gabapentin (NEURONTIN) 100 MG capsule Take 1 capsule (100 mg total) by mouth 2 (two) times daily as needed (anxiety). (Patient taking differently: Take 200 mg by mouth in the morning.) 180 capsule 1   gabapentin (NEURONTIN) 300 MG capsule Take 1 capsule (300 mg total) by mouth at bedtime. 90 capsule 1   Galcanezumab-gnlm 120 MG/ML SOAJ Inject 120 mg into the skin every 30 (thirty) days.     GOODSENSE PSYLLIUM FIBER PO Take 4 capsules by mouth every Monday, Tuesday, Wednesday, Thursday, and Friday.     hydrochlorothiazide (MICROZIDE) 12.5 MG capsule Take 12.5 mg by mouth in the morning.     JARDIANCE 10 MG TABS tablet Take 10 mg by mouth in the morning.     Lemborexant (DAYVIGO) 10 MG TABS Take 1 tablet (10 mg total) by mouth at bedtime. 90 tablet 1   metFORMIN (GLUCOPHAGE) 500 MG tablet Take 500 mg by mouth daily with supper.     methocarbamol (ROBAXIN) 500 MG tablet Take 1 tablet (500 mg total) by mouth every 6 (six) hours as needed for muscle spasms. 40 tablet 0   metoprolol succinate (TOPROL-XL) 100 MG 24 hr tablet Take 100 mg by mouth at bedtime.     mometasone (ELOCON) 0.1 % lotion 5 drops daily. Instill 5 drops into right ear daily     Naphazoline-Pheniramine (OPCON-A) 0.027-0.315 % SOLN Place 1-2 drops into both eyes 3 (three) times daily as needed (irritated/allergy eyes). (Patient not taking: Reported on 10/24/2022)     OnabotulinumtoxinA (BOTOX IM) Inject  1 Dose into the muscle every 3 (three) months. FOR MIGRAINES     ondansetron (ZOFRAN) 4 MG tablet Take 1 tablet (4 mg total) by mouth every 6 (six) hours as needed for nausea. 20 tablet 0   oxyCODONE (OXY IR/ROXICODONE) 5 MG immediate release tablet Take 1-2 tablets (5-10 mg total) by mouth every 6 (six) hours as needed for severe pain. 42 tablet 0   rizatriptan (MAXALT-MLT) 10 MG disintegrating tablet Take 10 mg by mouth as needed for migraine.      Tetrahydrozoline HCl (VISINE OP) Place 1 drop into both eyes daily as needed (irritation).      thyroid (ARMOUR) 30 MG tablet Take 30 mg by mouth daily before breakfast.      traMADol (ULTRAM) 50 MG tablet Take 1-2 tablets (50-100 mg total) by mouth every 6 (six) hours as needed for moderate pain. 40 tablet 0   UBRELVY 50 MG TABS Take 50 mg by mouth daily as needed (migraine).     Vilazodone HCl 20 MG TABS Take 1 tablet (20 mg total) by mouth daily with breakfast. 90 tablet 1   Facility-Administered Medications Prior to Visit  Medication Dose Route Frequency Provider Last Rate Last Admin   ondansetron (ZOFRAN) 4 mg in sodium chloride 0.9 % 50 mL IVPB  4 mg Intravenous Q6H PRN Barnett Abu, MD        PAST MEDICAL HISTORY: Past Medical History:  Diagnosis Date   Arthritis    Back pain of lumbar region with sciatica    Cancer (HCC)    breast   Depression    Diabetes mellitus, type II (HCC)    Family history of anesthesia complication    grandfather died under anesthesia 70's- had heart issues   H/O carpal tunnel repair 2014   Headache(784.0)    Hyperlipidemia    Hypertension    Hypothyroidism    Medial meniscus tear    PONV (postoperative nausea and vomiting)    Sleep apnea    cpap since 10    PAST SURGICAL HISTORY: Past Surgical History:  Procedure Laterality Date   BILATERAL TOTAL MASTECTOMY WITH AXILLARY LYMPH NODE DISSECTION     BREAST RECONSTRUCTION     CERVICAL DISC ARTHROPLASTY N/A 03/19/2013   Procedure: CERVICAL FIVE TO SIX, CERVICAL SIX TO SEVEN CERVICAL ANTERIOR DISC ARTHROPLASTY;  Surgeon: Barnett Abu, MD;  Location: [facility];  Service: Neurosurgery;  Laterality: N/A;  C56 C67 artificial disc replacement   CESAREAN SECTION     COLONOSCOPY     DILATATION & CURETTAGE/HYSTEROSCOPY WITH TRUECLEAR N/A 11/25/2013   Procedure: DILATATION & CURETTAGE/HYSTEROSCOPY WITH TRUCLEAR;  Surgeon: Meriel Pica, MD;  Location: [facility];  Service: Gynecology;  Laterality: N/A;   ECTOPIC PREGNANCY SURGERY     GALLBLADDER SURGERY     MOUTH SURGERY     child- dog  bite   SPINAL FUSION     2023   STERIOD INJECTION Left 09/09/2022   Procedure: LEFT KNEE STEROID INJECTION;  Surgeon: Ollen Gross, MD;  Location: [facility];  Service: Orthopedics;  Laterality: Left;  left knee injection 0928   TONSILLECTOMY     TOTAL KNEE ARTHROPLASTY Right 09/09/2022   Procedure: TOTAL KNEE ARTHROPLASTY;  Surgeon: Ollen Gross, MD;  Location: [facility];  Service: Orthopedics;  Laterality: Right;   TUBAL LIGATION      FAMILY HISTORY: Family History  Problem Relation Age of Onset   Hyperlipidemia Mother    Heart disease Father    Hypertension Father  Diabetes Father    Cancer Maternal Aunt        Breast - In 50's   Cancer Paternal Aunt        Breast - in 74's   Cancer Maternal Grandmother        breast cancer   Diabetes Paternal Grandmother    Heart disease Paternal Grandfather    Hypertension Paternal Grandfather    Suicidality Other    Sleep apnea Neg Hx     SOCIAL HISTORY: Social History   Socioeconomic History   Marital status: Married    Spouse name: Rosanne Ashing   Number of children: 3   Years of education: college   Highest education level: Not on file  Occupational History   Occupation: N/A  Tobacco Use   Smoking status: Never   Smokeless tobacco: Never   Tobacco comments:    occ alcohol  Vaping Use   Vaping status: Never Used  Substance and Sexual Activity   Alcohol use: Yes    Alcohol/week: 1.0 standard drink of alcohol    Types: 1 Cans of beer per week    Comment: occ   Drug use: No   Sexual activity: Not Currently  Other Topics Concern   Not on file  Social History Narrative   Denies caffeine use    Social Determinants of Health   Financial Resource Strain: Not on file  Food Insecurity: No Food Insecurity (09/09/2022)   Hunger Vital Sign    Worried About Running Out of Food in the Last Year: Never true    Ran Out of Food in the Last Year: Never true  Transportation Needs: No Transportation Needs (09/09/2022)   PRAPARE -  Administrator, Civil Service (Medical): No    Lack of Transportation (Non-Medical): No  Physical Activity: Not on file  Stress: Not on file  Social Connections: Unknown (06/04/2021)   Received from [facility]   Social Network    Social Network: Not on file  Intimate Partner Violence: Not At Risk (09/09/2022)   Humiliation, Afraid, Rape, and Kick questionnaire    Fear of Current or Ex-Partner: No    Emotionally Abused: No    Physically Abused: No    Sexually Abused: No      PHYSICAL EXAM  Generalized: Well developed, in no acute distress  Chest: Lungs clear to auscultation bilaterally  Neurological examination  Mentation: Alert oriented to time, place, history taking. Follows all commands speech and language fluent Cranial nerve II-XII: Extraocular movements were full, visual field were full on confrontational test Head turning and shoulder shrug  were normal and symmetric. Motor: The motor testing reveals 5 over 5 strength of all 4 extremities. Good symmetric motor tone is noted throughout.  Sensory: Sensory testing is intact to soft touch on all 4 extremities. No evidence of extinction is noted.  Gait and station: Gait is normal.    DIAGNOSTIC DATA (LABS, IMAGING, TESTING) - I reviewed patient records, labs, notes, testing and imaging myself where available.  Lab Results  Component Value Date   WBC 12.6 (H) 09/10/2022   HGB 12.8 09/10/2022   HCT 40.0 09/10/2022   MCV 88.1 09/10/2022   PLT 203 09/10/2022      Component Value Date/Time   NA 136 09/10/2022 0326   K 3.7 09/10/2022 0326   CL 104 09/10/2022 0326   CO2 23 09/10/2022 0326   GLUCOSE 163 (H) 09/10/2022 0326   BUN 12 09/10/2022 0326   CREATININE 0.57  09/10/2022 0326   CREATININE 0.73 01/19/2018 1556   CALCIUM 8.5 (L) 09/10/2022 0326   PROT 6.8 01/19/2018 1556   ALBUMIN 3.6 01/19/2018 1556   AST 15 01/19/2018 1556   ALT 15 01/19/2018 1556   ALKPHOS 53 01/19/2018 1556    BILITOT 0.3 01/19/2018 1556   GFRNONAA >60 09/10/2022 0326   GFRNONAA >60 01/19/2018 1556   GFRAA >60 01/19/2018 1556      ASSESSMENT AND PLAN 62 y.o. year old female  has a past medical history of Arthritis, Back pain of lumbar region with sciatica, Cancer (HCC), Depression, Diabetes mellitus, type II (HCC), Family history of anesthesia complication, H/O carpal tunnel repair (2014), Headache(784.0), Hyperlipidemia, Hypertension, Hypothyroidism, Medial meniscus tear, PONV (postoperative nausea and vomiting), and Sleep apnea. here with:  OSA on CPAP  - CPAP compliance excellent - Good treatment of AHI  - Encourage patient to use CPAP nightly and > 4 hours each night - F/U in 1 year or sooner if needed   Butch Penny, MSN, NP-C 10/31/2022, 1:51 PM [facility] [address] [place], [place] [address] [phone]

## 2022-10-30 NOTE — Therapy (Signed)
OUTPATIENT PHYSICAL THERAPY LOWER EXTREMITY TREATMENT    Patient Name: [name] MRN: [id number] DOB:06/19/1960, 62 y.o., female Today's Date: 10/30/2022  END OF SESSION:  PT End of Session - 10/30/22 1618     Visit Number 19    Date for PT Re-Evaluation 12/05/22    Authorization Type [employer]    PT Start Time 1617    PT Stop Time 1700    PT Time Calculation (min) 43 min    Activity Tolerance Patient limited by pain;Patient tolerated treatment well    Behavior During Therapy [facility] for tasks assessed/performed                      Past Medical History:  Diagnosis Date   Arthritis    Back pain of lumbar region with sciatica    Cancer (HCC)    breast   Depression    Diabetes mellitus, type II (HCC)    Family history of anesthesia complication    grandfather died under anesthesia 70's- had heart issues   H/O carpal tunnel repair 2014   Headache(784.0)    Hyperlipidemia    Hypertension    Hypothyroidism    Medial meniscus tear    PONV (postoperative nausea and vomiting)    Sleep apnea    cpap since 10   Past Surgical History:  Procedure Laterality Date   BILATERAL TOTAL MASTECTOMY WITH AXILLARY LYMPH NODE DISSECTION     BREAST RECONSTRUCTION     CERVICAL DISC ARTHROPLASTY N/A 03/19/2013   Procedure: CERVICAL FIVE TO SIX, CERVICAL SIX TO SEVEN CERVICAL ANTERIOR DISC ARTHROPLASTY;  Surgeon: Barnett Abu, MD;  Location: [facility];  Service: Neurosurgery;  Laterality: N/A;  C56 C67 artificial disc replacement   CESAREAN SECTION     COLONOSCOPY     DILATATION & CURETTAGE/HYSTEROSCOPY WITH TRUECLEAR N/A 11/25/2013   Procedure: DILATATION & CURETTAGE/HYSTEROSCOPY WITH TRUCLEAR;  Surgeon: Meriel Pica, MD;  Location: [facility];  Service: Gynecology;  Laterality: N/A;   ECTOPIC PREGNANCY SURGERY     GALLBLADDER SURGERY     MOUTH SURGERY     child- dog bite   SPINAL FUSION     2023   STERIOD INJECTION Left 09/09/2022   Procedure: LEFT KNEE STEROID  INJECTION;  Surgeon: Ollen Gross, MD;  Location: [facility];  Service: Orthopedics;  Laterality: Left;  left knee injection 0928   TONSILLECTOMY     TOTAL KNEE ARTHROPLASTY Right 09/09/2022   Procedure: TOTAL KNEE ARTHROPLASTY;  Surgeon: Ollen Gross, MD;  Location: [facility];  Service: Orthopedics;  Laterality: Right;   TUBAL LIGATION     Patient Active Problem List   Diagnosis Date Noted   OA (osteoarthritis) of knee 09/09/2022   Exertional dyspnea 04/17/2022   Elevated coronary artery calcium score 04/17/2022   Spondylolisthesis at L4-L5 level 12/27/2021   Ductal carcinoma in situ (DCIS) of left breast 01/19/2018   Generalized anxiety disorder 11/07/2017   Depression 11/07/2017   Insomnia 11/07/2017   Cervical spondylosis 03/19/2013    PCP: Creola Corn  REFERRING PROVIDER: Trudee Grip  REFERRING DIAG: R TKA 09/09/22  THERAPY DIAG:  S/P total knee arthroplasty, right  Acute pain of right knee  Stiffness of right knee, not elsewhere classified  Muscle weakness (generalized)  Other abnormalities of gait and mobility  Localized edema  Rationale for Evaluation and Treatment: Rehabilitation  ONSET DATE: 09/09/22  SUBJECTIVE:   SUBJECTIVE STATEMENT :  I have tried to do some exercises to work on the  Goal status: 09/20/22 met  2.  Patient will be do TUG < 30s Baseline: 1 min 28s  Goal status: ongoing 10/07/22, 18.57s MET 10/09/22  3.  Patient will be able to do 5 sit to stands  Baseline: unable to do  Goal status: progressing 10/04/22, MET 10/09/22   LONG TERM GOALS: Target date: 12/05/22  Patient will be independent with advanced/ongoing HEP to improve outcomes and carryover.  Goal status: INITIAL  2.  Patient will report at least 75% improvement in R knee pain to improve QOL. Baseline: 10/10 Goal status: IN PROGRESS   3.  Patient will demonstrate improved R knee AROM to >/= 0-120 deg to allow for normal gait and stair mechanics. Baseline: 30-0-60 10/09/22: 16-0-78  Goal status: IN PROGRESS  4.  Patient will demonstrate improved functional LE strength as demonstrated by 5/5. Baseline: 2/5 Goal status: IN PROGRESS 3+/5 10/09/22  5.  Patient will be able to ambulate 500' with LRAD and normal gait pattern without increased pain to access community.  Baseline: using RW small in home ambulation distances Goal status: IN PROGRESS 10/09/22  6.  Patient will report 63 on FOTO (patient reported outcome measure) to demonstrate improved functional ability. Baseline: 4 Goal status: INITIAL  PLAN:  PT FREQUENCY: 2x/week  PT DURATION: 12 weeks  PLANNED INTERVENTIONS: Therapeutic exercises, Therapeutic activity,  Neuromuscular re-education, Balance training, Gait training, Patient/Family education, Self Care, Joint mobilization, Stair training, Electrical stimulation, Cryotherapy, Moist heat, scar mobilization, Vasopneumatic device, and Manual therapy  PLAN FOR NEXT SESSION: will continue to alter plan and try to gain her ROM Measure next visit  Patient Details  Name: [name] MRN: [id number] Date of Birth: [birth year] Referring Provider:  Ollen Gross, MD  Encounter Date: 10/30/2022   Cassie Freer, PT 10/30/2022, 5:00 PM  Goal status: 09/20/22 met  2.  Patient will be do TUG < 30s Baseline: 1 min 28s  Goal status: ongoing 10/07/22, 18.57s MET 10/09/22  3.  Patient will be able to do 5 sit to stands  Baseline: unable to do  Goal status: progressing 10/04/22, MET 10/09/22   LONG TERM GOALS: Target date: 12/05/22  Patient will be independent with advanced/ongoing HEP to improve outcomes and carryover.  Goal status: INITIAL  2.  Patient will report at least 75% improvement in R knee pain to improve QOL. Baseline: 10/10 Goal status: IN PROGRESS   3.  Patient will demonstrate improved R knee AROM to >/= 0-120 deg to allow for normal gait and stair mechanics. Baseline: 30-0-60 10/09/22: 16-0-78  Goal status: IN PROGRESS  4.  Patient will demonstrate improved functional LE strength as demonstrated by 5/5. Baseline: 2/5 Goal status: IN PROGRESS 3+/5 10/09/22  5.  Patient will be able to ambulate 500' with LRAD and normal gait pattern without increased pain to access community.  Baseline: using RW small in home ambulation distances Goal status: IN PROGRESS 10/09/22  6.  Patient will report 63 on FOTO (patient reported outcome measure) to demonstrate improved functional ability. Baseline: 4 Goal status: INITIAL  PLAN:  PT FREQUENCY: 2x/week  PT DURATION: 12 weeks  PLANNED INTERVENTIONS: Therapeutic exercises, Therapeutic activity,  Neuromuscular re-education, Balance training, Gait training, Patient/Family education, Self Care, Joint mobilization, Stair training, Electrical stimulation, Cryotherapy, Moist heat, scar mobilization, Vasopneumatic device, and Manual therapy  PLAN FOR NEXT SESSION: will continue to alter plan and try to gain her ROM Measure next visit  Patient Details  Name: [name] MRN: [id number] Date of Birth: [birth year] Referring Provider:  Ollen Gross, MD  Encounter Date: 10/30/2022   Cassie Freer, PT 10/30/2022, 5:00 PM  Goal status: 09/20/22 met  2.  Patient will be do TUG < 30s Baseline: 1 min 28s  Goal status: ongoing 10/07/22, 18.57s MET 10/09/22  3.  Patient will be able to do 5 sit to stands  Baseline: unable to do  Goal status: progressing 10/04/22, MET 10/09/22   LONG TERM GOALS: Target date: 12/05/22  Patient will be independent with advanced/ongoing HEP to improve outcomes and carryover.  Goal status: INITIAL  2.  Patient will report at least 75% improvement in R knee pain to improve QOL. Baseline: 10/10 Goal status: IN PROGRESS   3.  Patient will demonstrate improved R knee AROM to >/= 0-120 deg to allow for normal gait and stair mechanics. Baseline: 30-0-60 10/09/22: 16-0-78  Goal status: IN PROGRESS  4.  Patient will demonstrate improved functional LE strength as demonstrated by 5/5. Baseline: 2/5 Goal status: IN PROGRESS 3+/5 10/09/22  5.  Patient will be able to ambulate 500' with LRAD and normal gait pattern without increased pain to access community.  Baseline: using RW small in home ambulation distances Goal status: IN PROGRESS 10/09/22  6.  Patient will report 63 on FOTO (patient reported outcome measure) to demonstrate improved functional ability. Baseline: 4 Goal status: INITIAL  PLAN:  PT FREQUENCY: 2x/week  PT DURATION: 12 weeks  PLANNED INTERVENTIONS: Therapeutic exercises, Therapeutic activity,  Neuromuscular re-education, Balance training, Gait training, Patient/Family education, Self Care, Joint mobilization, Stair training, Electrical stimulation, Cryotherapy, Moist heat, scar mobilization, Vasopneumatic device, and Manual therapy  PLAN FOR NEXT SESSION: will continue to alter plan and try to gain her ROM Measure next visit  Patient Details  Name: [name] MRN: [id number] Date of Birth: [birth year] Referring Provider:  Ollen Gross, MD  Encounter Date: 10/30/2022   Cassie Freer, PT 10/30/2022, 5:00 PM  OUTPATIENT PHYSICAL THERAPY LOWER EXTREMITY TREATMENT    Patient Name: [name] MRN: [id number] DOB:06/19/1960, 62 y.o., female Today's Date: 10/30/2022  END OF SESSION:  PT End of Session - 10/30/22 1618     Visit Number 19    Date for PT Re-Evaluation 12/05/22    Authorization Type [employer]    PT Start Time 1617    PT Stop Time 1700    PT Time Calculation (min) 43 min    Activity Tolerance Patient limited by pain;Patient tolerated treatment well    Behavior During Therapy [facility] for tasks assessed/performed                      Past Medical History:  Diagnosis Date   Arthritis    Back pain of lumbar region with sciatica    Cancer (HCC)    breast   Depression    Diabetes mellitus, type II (HCC)    Family history of anesthesia complication    grandfather died under anesthesia 70's- had heart issues   H/O carpal tunnel repair 2014   Headache(784.0)    Hyperlipidemia    Hypertension    Hypothyroidism    Medial meniscus tear    PONV (postoperative nausea and vomiting)    Sleep apnea    cpap since 10   Past Surgical History:  Procedure Laterality Date   BILATERAL TOTAL MASTECTOMY WITH AXILLARY LYMPH NODE DISSECTION     BREAST RECONSTRUCTION     CERVICAL DISC ARTHROPLASTY N/A 03/19/2013   Procedure: CERVICAL FIVE TO SIX, CERVICAL SIX TO SEVEN CERVICAL ANTERIOR DISC ARTHROPLASTY;  Surgeon: Barnett Abu, MD;  Location: [facility];  Service: Neurosurgery;  Laterality: N/A;  C56 C67 artificial disc replacement   CESAREAN SECTION     COLONOSCOPY     DILATATION & CURETTAGE/HYSTEROSCOPY WITH TRUECLEAR N/A 11/25/2013   Procedure: DILATATION & CURETTAGE/HYSTEROSCOPY WITH TRUCLEAR;  Surgeon: Meriel Pica, MD;  Location: [facility];  Service: Gynecology;  Laterality: N/A;   ECTOPIC PREGNANCY SURGERY     GALLBLADDER SURGERY     MOUTH SURGERY     child- dog bite   SPINAL FUSION     2023   STERIOD INJECTION Left 09/09/2022   Procedure: LEFT KNEE STEROID  INJECTION;  Surgeon: Ollen Gross, MD;  Location: [facility];  Service: Orthopedics;  Laterality: Left;  left knee injection 0928   TONSILLECTOMY     TOTAL KNEE ARTHROPLASTY Right 09/09/2022   Procedure: TOTAL KNEE ARTHROPLASTY;  Surgeon: Ollen Gross, MD;  Location: [facility];  Service: Orthopedics;  Laterality: Right;   TUBAL LIGATION     Patient Active Problem List   Diagnosis Date Noted   OA (osteoarthritis) of knee 09/09/2022   Exertional dyspnea 04/17/2022   Elevated coronary artery calcium score 04/17/2022   Spondylolisthesis at L4-L5 level 12/27/2021   Ductal carcinoma in situ (DCIS) of left breast 01/19/2018   Generalized anxiety disorder 11/07/2017   Depression 11/07/2017   Insomnia 11/07/2017   Cervical spondylosis 03/19/2013    PCP: Creola Corn  REFERRING PROVIDER: Trudee Grip  REFERRING DIAG: R TKA 09/09/22  THERAPY DIAG:  S/P total knee arthroplasty, right  Acute pain of right knee  Stiffness of right knee, not elsewhere classified  Muscle weakness (generalized)  Other abnormalities of gait and mobility  Localized edema  Rationale for Evaluation and Treatment: Rehabilitation  ONSET DATE: 09/09/22  SUBJECTIVE:   SUBJECTIVE STATEMENT :  I have tried to do some exercises to work on the

## 2022-10-31 ENCOUNTER — Telehealth (INDEPENDENT_AMBULATORY_CARE_PROVIDER_SITE_OTHER): Payer: No Typology Code available for payment source | Admitting: Adult Health

## 2022-10-31 DIAGNOSIS — G4733 Obstructive sleep apnea (adult) (pediatric): Secondary | ICD-10-CM | POA: Diagnosis not present

## 2022-10-31 NOTE — Therapy (Signed)
OUTPATIENT PHYSICAL THERAPY LOWER EXTREMITY TREATMENT    Patient Name: [name] MRN: [id number] DOB:14-Jun-1960, 62 y.o., female Today's Date: 11/01/2022  END OF SESSION:  PT End of Session - 11/01/22 1059     Visit Number 20    Date for PT Re-Evaluation 12/05/22    Authorization Type [employer]    PT Start Time 1100    PT Stop Time 1149    PT Time Calculation (min) 49 min    Activity Tolerance Patient limited by pain;Patient tolerated treatment well    Behavior During Therapy [facility] for tasks assessed/performed                       Past Medical History:  Diagnosis Date   Arthritis    Back pain of lumbar region with sciatica    Cancer (HCC)    breast   Depression    Diabetes mellitus, type II (HCC)    Family history of anesthesia complication    grandfather died under anesthesia 70's- had heart issues   H/O carpal tunnel repair 2014   Headache(784.0)    Hyperlipidemia    Hypertension    Hypothyroidism    Medial meniscus tear    PONV (postoperative nausea and vomiting)    Sleep apnea    cpap since 10   Past Surgical History:  Procedure Laterality Date   BILATERAL TOTAL MASTECTOMY WITH AXILLARY LYMPH NODE DISSECTION     BREAST RECONSTRUCTION     CERVICAL DISC ARTHROPLASTY N/A 03/19/2013   Procedure: CERVICAL FIVE TO SIX, CERVICAL SIX TO SEVEN CERVICAL ANTERIOR DISC ARTHROPLASTY;  Surgeon: Barnett Abu, MD;  Location: [facility];  Service: Neurosurgery;  Laterality: N/A;  C56 C67 artificial disc replacement   CESAREAN SECTION     COLONOSCOPY     DILATATION & CURETTAGE/HYSTEROSCOPY WITH TRUECLEAR N/A 11/25/2013   Procedure: DILATATION & CURETTAGE/HYSTEROSCOPY WITH TRUCLEAR;  Surgeon: Meriel Pica, MD;  Location: [facility];  Service: Gynecology;  Laterality: N/A;   ECTOPIC PREGNANCY SURGERY     GALLBLADDER SURGERY     MOUTH SURGERY     child- dog bite   SPINAL FUSION     2023   STERIOD INJECTION Left 09/09/2022   Procedure: LEFT KNEE STEROID  INJECTION;  Surgeon: Ollen Gross, MD;  Location: [facility];  Service: Orthopedics;  Laterality: Left;  left knee injection 0928   TONSILLECTOMY     TOTAL KNEE ARTHROPLASTY Right 09/09/2022   Procedure: TOTAL KNEE ARTHROPLASTY;  Surgeon: Ollen Gross, MD;  Location: [facility];  Service: Orthopedics;  Laterality: Right;   TUBAL LIGATION     Patient Active Problem List   Diagnosis Date Noted   OA (osteoarthritis) of knee 09/09/2022   Exertional dyspnea 04/17/2022   Elevated coronary artery calcium score 04/17/2022   Spondylolisthesis at L4-L5 level 12/27/2021   Ductal carcinoma in situ (DCIS) of left breast 01/19/2018   Generalized anxiety disorder 11/07/2017   Depression 11/07/2017   Insomnia 11/07/2017   Cervical spondylosis 03/19/2013    PCP: Creola Corn  REFERRING PROVIDER: Trudee Grip  REFERRING DIAG: R TKA 09/09/22  THERAPY DIAG:  S/P total knee arthroplasty, right  Acute pain of right knee  Stiffness of right knee, not elsewhere classified  Muscle weakness (generalized)  Other abnormalities of gait and mobility  Localized edema  Rationale for Evaluation and Treatment: Rehabilitation  ONSET DATE: 09/09/22  SUBJECTIVE:   SUBJECTIVE STATEMENT :  I brought the massage gun today. I am using the  OUTPATIENT PHYSICAL THERAPY LOWER EXTREMITY TREATMENT    Patient Name: [name] MRN: [id number] DOB:14-Jun-1960, 62 y.o., female Today's Date: 11/01/2022  END OF SESSION:  PT End of Session - 11/01/22 1059     Visit Number 20    Date for PT Re-Evaluation 12/05/22    Authorization Type [employer]    PT Start Time 1100    PT Stop Time 1149    PT Time Calculation (min) 49 min    Activity Tolerance Patient limited by pain;Patient tolerated treatment well    Behavior During Therapy [facility] for tasks assessed/performed                       Past Medical History:  Diagnosis Date   Arthritis    Back pain of lumbar region with sciatica    Cancer (HCC)    breast   Depression    Diabetes mellitus, type II (HCC)    Family history of anesthesia complication    grandfather died under anesthesia 70's- had heart issues   H/O carpal tunnel repair 2014   Headache(784.0)    Hyperlipidemia    Hypertension    Hypothyroidism    Medial meniscus tear    PONV (postoperative nausea and vomiting)    Sleep apnea    cpap since 10   Past Surgical History:  Procedure Laterality Date   BILATERAL TOTAL MASTECTOMY WITH AXILLARY LYMPH NODE DISSECTION     BREAST RECONSTRUCTION     CERVICAL DISC ARTHROPLASTY N/A 03/19/2013   Procedure: CERVICAL FIVE TO SIX, CERVICAL SIX TO SEVEN CERVICAL ANTERIOR DISC ARTHROPLASTY;  Surgeon: Barnett Abu, MD;  Location: [facility];  Service: Neurosurgery;  Laterality: N/A;  C56 C67 artificial disc replacement   CESAREAN SECTION     COLONOSCOPY     DILATATION & CURETTAGE/HYSTEROSCOPY WITH TRUECLEAR N/A 11/25/2013   Procedure: DILATATION & CURETTAGE/HYSTEROSCOPY WITH TRUCLEAR;  Surgeon: Meriel Pica, MD;  Location: [facility];  Service: Gynecology;  Laterality: N/A;   ECTOPIC PREGNANCY SURGERY     GALLBLADDER SURGERY     MOUTH SURGERY     child- dog bite   SPINAL FUSION     2023   STERIOD INJECTION Left 09/09/2022   Procedure: LEFT KNEE STEROID  INJECTION;  Surgeon: Ollen Gross, MD;  Location: [facility];  Service: Orthopedics;  Laterality: Left;  left knee injection 0928   TONSILLECTOMY     TOTAL KNEE ARTHROPLASTY Right 09/09/2022   Procedure: TOTAL KNEE ARTHROPLASTY;  Surgeon: Ollen Gross, MD;  Location: [facility];  Service: Orthopedics;  Laterality: Right;   TUBAL LIGATION     Patient Active Problem List   Diagnosis Date Noted   OA (osteoarthritis) of knee 09/09/2022   Exertional dyspnea 04/17/2022   Elevated coronary artery calcium score 04/17/2022   Spondylolisthesis at L4-L5 level 12/27/2021   Ductal carcinoma in situ (DCIS) of left breast 01/19/2018   Generalized anxiety disorder 11/07/2017   Depression 11/07/2017   Insomnia 11/07/2017   Cervical spondylosis 03/19/2013    PCP: Creola Corn  REFERRING PROVIDER: Trudee Grip  REFERRING DIAG: R TKA 09/09/22  THERAPY DIAG:  S/P total knee arthroplasty, right  Acute pain of right knee  Stiffness of right knee, not elsewhere classified  Muscle weakness (generalized)  Other abnormalities of gait and mobility  Localized edema  Rationale for Evaluation and Treatment: Rehabilitation  ONSET DATE: 09/09/22  SUBJECTIVE:   SUBJECTIVE STATEMENT :  I brought the massage gun today. I am using the  bathing, toileting, and locomotion level  PARTICIPATION LIMITATIONS: meal prep, cleaning, laundry, driving, shopping, and community activity  REHAB POTENTIAL: Good  CLINICAL DECISION MAKING: Stable/uncomplicated  EVALUATION COMPLEXITY: Low   GOALS: Goals reviewed with patient? Yes  SHORT TERM GOALS: Target date: 10/24/22  Patient will be independent with initial HEP. Goal status: 09/20/22 met  2.  Patient will be do TUG < 30s Baseline: 1 min 28s  Goal status: ongoing 10/07/22, 18.57s MET 10/09/22  3.  Patient will be able to do 5 sit to stands  Baseline: unable to do  Goal status: progressing 10/04/22, MET 10/09/22   LONG TERM GOALS: Target date: 12/05/22  Patient will be independent with advanced/ongoing HEP to improve outcomes and carryover.  Goal status: INITIAL  2.  Patient will report at least 75% improvement in R knee pain to improve QOL. Baseline: 10/10 Goal status: IN PROGRESS  50% better 11/01/22  3.  Patient will demonstrate improved R knee AROM to >/= 0-120 deg to allow for normal gait and stair mechanics. Baseline: 30-0-60 10/09/22: 16-0-78  11/01/22: 14-0-83 Goal status: IN PROGRESS  4.  Patient will demonstrate improved functional LE strength as demonstrated by 5/5. Baseline: 2/5 Goal status: MET 3+/5 10/09/22, 5/5 11/01/22   5.  Patient will be able to ambulate 500' with LRAD and normal gait pattern without increased pain to access community.  Baseline: using RW small in home ambulation distances Goal status: IN PROGRESS 10/09/22, 11/01/22 ongoing  6.  Patient will report 78  on FOTO (patient reported outcome measure) to demonstrate improved functional ability. Baseline: 4, 40  Goal status: IN PROGRESS 11/01/22  PLAN:  PT FREQUENCY: 2x/week  PT DURATION: 12 weeks  PLANNED INTERVENTIONS: Therapeutic exercises, Therapeutic activity, Neuromuscular re-education, Balance training, Gait training, Patient/Family education, Self Care, Joint mobilization, Stair training, Electrical stimulation, Cryotherapy, Moist heat, scar mobilization, Vasopneumatic device, and Manual therapy  PLAN FOR NEXT SESSION: will continue to alter plan and try to gain her ROM  Patient Details  Name: [name] MRN: [id number] Date of Birth: October 23, 1960 Referring Provider:  Ollen Gross, MD  Encounter Date: 11/01/2022   Cassie Freer, PT 11/01/2022, 11:50 AM  bathing, toileting, and locomotion level  PARTICIPATION LIMITATIONS: meal prep, cleaning, laundry, driving, shopping, and community activity  REHAB POTENTIAL: Good  CLINICAL DECISION MAKING: Stable/uncomplicated  EVALUATION COMPLEXITY: Low   GOALS: Goals reviewed with patient? Yes  SHORT TERM GOALS: Target date: 10/24/22  Patient will be independent with initial HEP. Goal status: 09/20/22 met  2.  Patient will be do TUG < 30s Baseline: 1 min 28s  Goal status: ongoing 10/07/22, 18.57s MET 10/09/22  3.  Patient will be able to do 5 sit to stands  Baseline: unable to do  Goal status: progressing 10/04/22, MET 10/09/22   LONG TERM GOALS: Target date: 12/05/22  Patient will be independent with advanced/ongoing HEP to improve outcomes and carryover.  Goal status: INITIAL  2.  Patient will report at least 75% improvement in R knee pain to improve QOL. Baseline: 10/10 Goal status: IN PROGRESS  50% better 11/01/22  3.  Patient will demonstrate improved R knee AROM to >/= 0-120 deg to allow for normal gait and stair mechanics. Baseline: 30-0-60 10/09/22: 16-0-78  11/01/22: 14-0-83 Goal status: IN PROGRESS  4.  Patient will demonstrate improved functional LE strength as demonstrated by 5/5. Baseline: 2/5 Goal status: MET 3+/5 10/09/22, 5/5 11/01/22   5.  Patient will be able to ambulate 500' with LRAD and normal gait pattern without increased pain to access community.  Baseline: using RW small in home ambulation distances Goal status: IN PROGRESS 10/09/22, 11/01/22 ongoing  6.  Patient will report 78  on FOTO (patient reported outcome measure) to demonstrate improved functional ability. Baseline: 4, 40  Goal status: IN PROGRESS 11/01/22  PLAN:  PT FREQUENCY: 2x/week  PT DURATION: 12 weeks  PLANNED INTERVENTIONS: Therapeutic exercises, Therapeutic activity, Neuromuscular re-education, Balance training, Gait training, Patient/Family education, Self Care, Joint mobilization, Stair training, Electrical stimulation, Cryotherapy, Moist heat, scar mobilization, Vasopneumatic device, and Manual therapy  PLAN FOR NEXT SESSION: will continue to alter plan and try to gain her ROM  Patient Details  Name: [name] MRN: [id number] Date of Birth: October 23, 1960 Referring Provider:  Ollen Gross, MD  Encounter Date: 11/01/2022   Cassie Freer, PT 11/01/2022, 11:50 AM  OUTPATIENT PHYSICAL THERAPY LOWER EXTREMITY TREATMENT    Patient Name: [name] MRN: [id number] DOB:14-Jun-1960, 62 y.o., female Today's Date: 11/01/2022  END OF SESSION:  PT End of Session - 11/01/22 1059     Visit Number 20    Date for PT Re-Evaluation 12/05/22    Authorization Type [employer]    PT Start Time 1100    PT Stop Time 1149    PT Time Calculation (min) 49 min    Activity Tolerance Patient limited by pain;Patient tolerated treatment well    Behavior During Therapy [facility] for tasks assessed/performed                       Past Medical History:  Diagnosis Date   Arthritis    Back pain of lumbar region with sciatica    Cancer (HCC)    breast   Depression    Diabetes mellitus, type II (HCC)    Family history of anesthesia complication    grandfather died under anesthesia 70's- had heart issues   H/O carpal tunnel repair 2014   Headache(784.0)    Hyperlipidemia    Hypertension    Hypothyroidism    Medial meniscus tear    PONV (postoperative nausea and vomiting)    Sleep apnea    cpap since 10   Past Surgical History:  Procedure Laterality Date   BILATERAL TOTAL MASTECTOMY WITH AXILLARY LYMPH NODE DISSECTION     BREAST RECONSTRUCTION     CERVICAL DISC ARTHROPLASTY N/A 03/19/2013   Procedure: CERVICAL FIVE TO SIX, CERVICAL SIX TO SEVEN CERVICAL ANTERIOR DISC ARTHROPLASTY;  Surgeon: Barnett Abu, MD;  Location: [facility];  Service: Neurosurgery;  Laterality: N/A;  C56 C67 artificial disc replacement   CESAREAN SECTION     COLONOSCOPY     DILATATION & CURETTAGE/HYSTEROSCOPY WITH TRUECLEAR N/A 11/25/2013   Procedure: DILATATION & CURETTAGE/HYSTEROSCOPY WITH TRUCLEAR;  Surgeon: Meriel Pica, MD;  Location: [facility];  Service: Gynecology;  Laterality: N/A;   ECTOPIC PREGNANCY SURGERY     GALLBLADDER SURGERY     MOUTH SURGERY     child- dog bite   SPINAL FUSION     2023   STERIOD INJECTION Left 09/09/2022   Procedure: LEFT KNEE STEROID  INJECTION;  Surgeon: Ollen Gross, MD;  Location: [facility];  Service: Orthopedics;  Laterality: Left;  left knee injection 0928   TONSILLECTOMY     TOTAL KNEE ARTHROPLASTY Right 09/09/2022   Procedure: TOTAL KNEE ARTHROPLASTY;  Surgeon: Ollen Gross, MD;  Location: [facility];  Service: Orthopedics;  Laterality: Right;   TUBAL LIGATION     Patient Active Problem List   Diagnosis Date Noted   OA (osteoarthritis) of knee 09/09/2022   Exertional dyspnea 04/17/2022   Elevated coronary artery calcium score 04/17/2022   Spondylolisthesis at L4-L5 level 12/27/2021   Ductal carcinoma in situ (DCIS) of left breast 01/19/2018   Generalized anxiety disorder 11/07/2017   Depression 11/07/2017   Insomnia 11/07/2017   Cervical spondylosis 03/19/2013    PCP: Creola Corn  REFERRING PROVIDER: Trudee Grip  REFERRING DIAG: R TKA 09/09/22  THERAPY DIAG:  S/P total knee arthroplasty, right  Acute pain of right knee  Stiffness of right knee, not elsewhere classified  Muscle weakness (generalized)  Other abnormalities of gait and mobility  Localized edema  Rationale for Evaluation and Treatment: Rehabilitation  ONSET DATE: 09/09/22  SUBJECTIVE:   SUBJECTIVE STATEMENT :  I brought the massage gun today. I am using the

## 2022-11-01 ENCOUNTER — Ambulatory Visit: Payer: No Typology Code available for payment source

## 2022-11-01 DIAGNOSIS — Z96651 Presence of right artificial knee joint: Secondary | ICD-10-CM

## 2022-11-01 DIAGNOSIS — R600 Localized edema: Secondary | ICD-10-CM

## 2022-11-01 DIAGNOSIS — M25661 Stiffness of right knee, not elsewhere classified: Secondary | ICD-10-CM

## 2022-11-01 DIAGNOSIS — R2689 Other abnormalities of gait and mobility: Secondary | ICD-10-CM

## 2022-11-01 DIAGNOSIS — M6281 Muscle weakness (generalized): Secondary | ICD-10-CM

## 2022-11-01 DIAGNOSIS — M25561 Pain in right knee: Secondary | ICD-10-CM

## 2022-11-04 ENCOUNTER — Ambulatory Visit: Payer: No Typology Code available for payment source | Admitting: Professional Counselor

## 2022-11-04 ENCOUNTER — Ambulatory Visit: Payer: No Typology Code available for payment source | Admitting: Physical Therapy

## 2022-11-04 ENCOUNTER — Encounter: Payer: Self-pay | Admitting: Physical Therapy

## 2022-11-04 DIAGNOSIS — Z96651 Presence of right artificial knee joint: Secondary | ICD-10-CM | POA: Diagnosis not present

## 2022-11-04 DIAGNOSIS — M25561 Pain in right knee: Secondary | ICD-10-CM

## 2022-11-04 DIAGNOSIS — M6281 Muscle weakness (generalized): Secondary | ICD-10-CM

## 2022-11-04 DIAGNOSIS — R2689 Other abnormalities of gait and mobility: Secondary | ICD-10-CM

## 2022-11-04 DIAGNOSIS — R600 Localized edema: Secondary | ICD-10-CM

## 2022-11-04 DIAGNOSIS — M25661 Stiffness of right knee, not elsewhere classified: Secondary | ICD-10-CM

## 2022-11-04 NOTE — Therapy (Signed)
improvement in R knee pain to improve QOL. Baseline: 10/10 Goal status: IN PROGRESS  50% better 11/01/22  3.  Patient will demonstrate improved R knee AROM to >/= 0-120 deg to allow for normal gait and stair mechanics. Baseline: 30-0-60 10/09/22: 16-0-78  11/01/22: 14-0-83 Goal status: IN PROGRESS  4.  Patient will demonstrate improved functional LE strength as demonstrated by 5/5. Baseline: 2/5 Goal status: MET 3+/5 10/09/22, 5/5 11/01/22   5.  Patient will be able to ambulate 500' with LRAD and normal gait pattern without increased pain to access community.  Baseline: using RW small in home ambulation distances Goal status: IN PROGRESS 10/09/22, 11/01/22 ongoing  6.  Patient will report 2 on FOTO (patient reported outcome measure) to demonstrate improved functional ability. Baseline: 4, 40  Goal status: IN PROGRESS 11/01/22  PLAN:  PT FREQUENCY: 2x/week  PT DURATION: 12 weeks  PLANNED INTERVENTIONS: Therapeutic exercises, Therapeutic activity, Neuromuscular re-education, Balance training, Gait training, Patient/Family education, Self Care, Joint mobilization, Stair training, Electrical stimulation, Cryotherapy, Moist heat, scar mobilization, Vasopneumatic device, and Manual therapy  PLAN FOR NEXT SESSION: continue to push the ROM, she seems to do better with some goals and visual goals to see where we are trying to take  her, then she will not resist as much Patient Details  Name: [name] MRN: [id number] Date of Birth: Nov 23, 1960 Referring Provider:  Ollen Gross, MD  Encounter Date: 11/04/2022   Jearld Lesch, PT 11/04/2022, 5:21 PM  improvement in R knee pain to improve QOL. Baseline: 10/10 Goal status: IN PROGRESS  50% better 11/01/22  3.  Patient will demonstrate improved R knee AROM to >/= 0-120 deg to allow for normal gait and stair mechanics. Baseline: 30-0-60 10/09/22: 16-0-78  11/01/22: 14-0-83 Goal status: IN PROGRESS  4.  Patient will demonstrate improved functional LE strength as demonstrated by 5/5. Baseline: 2/5 Goal status: MET 3+/5 10/09/22, 5/5 11/01/22   5.  Patient will be able to ambulate 500' with LRAD and normal gait pattern without increased pain to access community.  Baseline: using RW small in home ambulation distances Goal status: IN PROGRESS 10/09/22, 11/01/22 ongoing  6.  Patient will report 2 on FOTO (patient reported outcome measure) to demonstrate improved functional ability. Baseline: 4, 40  Goal status: IN PROGRESS 11/01/22  PLAN:  PT FREQUENCY: 2x/week  PT DURATION: 12 weeks  PLANNED INTERVENTIONS: Therapeutic exercises, Therapeutic activity, Neuromuscular re-education, Balance training, Gait training, Patient/Family education, Self Care, Joint mobilization, Stair training, Electrical stimulation, Cryotherapy, Moist heat, scar mobilization, Vasopneumatic device, and Manual therapy  PLAN FOR NEXT SESSION: continue to push the ROM, she seems to do better with some goals and visual goals to see where we are trying to take  her, then she will not resist as much Patient Details  Name: [name] MRN: [id number] Date of Birth: Nov 23, 1960 Referring Provider:  Ollen Gross, MD  Encounter Date: 11/04/2022   Jearld Lesch, PT 11/04/2022, 5:21 PM  OUTPATIENT PHYSICAL THERAPY LOWER EXTREMITY TREATMENT    Patient Name: [name] MRN: [id number] DOB:[birth year], 62 y.o., female Today's Date: 11/04/2022  END OF SESSION:  PT End of Session - 11/04/22 1720     Visit Number 21    Date for PT Re-Evaluation 12/05/22    Authorization Type [employer]    PT Start Time 1611    PT Stop Time 1653    PT Time Calculation (min) 42 min    Activity Tolerance Patient limited by pain;Patient tolerated treatment well    Behavior During Therapy [facility] for tasks assessed/performed                       Past Medical History:  Diagnosis Date   Arthritis    Back pain of lumbar region with sciatica    Cancer (HCC)    breast   Depression    Diabetes mellitus, type II (HCC)    Family history of anesthesia complication    grandfather died under anesthesia 70's- had heart issues   H/O carpal tunnel repair 2014   Headache(784.0)    Hyperlipidemia    Hypertension    Hypothyroidism    Medial meniscus tear    PONV (postoperative nausea and vomiting)    Sleep apnea    cpap since 10   Past Surgical History:  Procedure Laterality Date   BILATERAL TOTAL MASTECTOMY WITH AXILLARY LYMPH NODE DISSECTION     BREAST RECONSTRUCTION     CERVICAL DISC ARTHROPLASTY N/A 03/19/2013   Procedure: CERVICAL FIVE TO SIX, CERVICAL SIX TO SEVEN CERVICAL ANTERIOR DISC ARTHROPLASTY;  Surgeon: Barnett Abu, MD;  Location: [facility];  Service: Neurosurgery;  Laterality: N/A;  C56 C67 artificial disc replacement   CESAREAN SECTION     COLONOSCOPY     DILATATION & CURETTAGE/HYSTEROSCOPY WITH TRUECLEAR N/A 11/25/2013   Procedure: DILATATION & CURETTAGE/HYSTEROSCOPY WITH TRUCLEAR;  Surgeon: Meriel Pica, MD;  Location: [facility];  Service: Gynecology;  Laterality: N/A;   ECTOPIC PREGNANCY SURGERY     GALLBLADDER SURGERY     MOUTH SURGERY     child- dog bite   SPINAL FUSION     2023   STERIOD INJECTION Left 09/09/2022   Procedure: LEFT KNEE STEROID  INJECTION;  Surgeon: Ollen Gross, MD;  Location: [facility];  Service: Orthopedics;  Laterality: Left;  left knee injection 0928   TONSILLECTOMY     TOTAL KNEE ARTHROPLASTY Right 09/09/2022   Procedure: TOTAL KNEE ARTHROPLASTY;  Surgeon: Ollen Gross, MD;  Location: [facility];  Service: Orthopedics;  Laterality: Right;   TUBAL LIGATION     Patient Active Problem List   Diagnosis Date Noted   OA (osteoarthritis) of knee 09/09/2022   Exertional dyspnea 04/17/2022   Elevated coronary artery calcium score 04/17/2022   Spondylolisthesis at L4-L5 level 12/27/2021   Ductal carcinoma in situ (DCIS) of left breast 01/19/2018   Generalized anxiety disorder 11/07/2017   Depression 11/07/2017   Insomnia 11/07/2017   Cervical spondylosis 03/19/2013    PCP: Creola Corn  REFERRING PROVIDER: Trudee Grip  REFERRING DIAG: R TKA 09/09/22  THERAPY DIAG:  S/P total knee arthroplasty, right  Acute pain of right knee  Stiffness of right knee, not elsewhere classified  Muscle weakness (generalized)  Other abnormalities of gait and mobility  Localized edema  Rationale for Evaluation and Treatment: Rehabilitation  ONSET DATE: 09/09/22  SUBJECTIVE:   SUBJECTIVE STATEMENT :  I have really been trying to do more at home  OUTPATIENT PHYSICAL THERAPY LOWER EXTREMITY TREATMENT    Patient Name: [name] MRN: [id number] DOB:[birth year], 62 y.o., female Today's Date: 11/04/2022  END OF SESSION:  PT End of Session - 11/04/22 1720     Visit Number 21    Date for PT Re-Evaluation 12/05/22    Authorization Type UHC    PT Start Time 1611    PT Stop Time 1653    PT Time Calculation (min) 42 min    Activity Tolerance Patient limited by pain;Patient tolerated treatment well    Behavior During Therapy [facility] for tasks assessed/performed                       Past Medical History:  Diagnosis Date   Arthritis    Back pain of lumbar region with sciatica    Cancer (HCC)    breast   Depression    Diabetes mellitus, type II (HCC)    Family history of anesthesia complication    grandfather died under anesthesia 70's- had heart issues   H/O carpal tunnel repair 2014   Headache(784.0)    Hyperlipidemia    Hypertension    Hypothyroidism    Medial meniscus tear    PONV (postoperative nausea and vomiting)    Sleep apnea    cpap since 10   Past Surgical History:  Procedure Laterality Date   BILATERAL TOTAL MASTECTOMY WITH AXILLARY LYMPH NODE DISSECTION     BREAST RECONSTRUCTION     CERVICAL DISC ARTHROPLASTY N/A 03/19/2013   Procedure: CERVICAL FIVE TO SIX, CERVICAL SIX TO SEVEN CERVICAL ANTERIOR DISC ARTHROPLASTY;  Surgeon: Barnett Abu, MD;  Location: [facility];  Service: Neurosurgery;  Laterality: N/A;  C56 C67 artificial disc replacement   CESAREAN SECTION     COLONOSCOPY     DILATATION & CURETTAGE/HYSTEROSCOPY WITH TRUECLEAR N/A 11/25/2013   Procedure: DILATATION & CURETTAGE/HYSTEROSCOPY WITH TRUCLEAR;  Surgeon: Meriel Pica, MD;  Location: [facility];  Service: Gynecology;  Laterality: N/A;   ECTOPIC PREGNANCY SURGERY     GALLBLADDER SURGERY     MOUTH SURGERY     child- dog bite   SPINAL FUSION     2023   STERIOD INJECTION Left 09/09/2022   Procedure: LEFT KNEE STEROID  INJECTION;  Surgeon: Ollen Gross, MD;  Location: [facility];  Service: Orthopedics;  Laterality: Left;  left knee injection 0928   TONSILLECTOMY     TOTAL KNEE ARTHROPLASTY Right 09/09/2022   Procedure: TOTAL KNEE ARTHROPLASTY;  Surgeon: Ollen Gross, MD;  Location: [facility];  Service: Orthopedics;  Laterality: Right;   TUBAL LIGATION     Patient Active Problem List   Diagnosis Date Noted   OA (osteoarthritis) of knee 09/09/2022   Exertional dyspnea 04/17/2022   Elevated coronary artery calcium score 04/17/2022   Spondylolisthesis at L4-L5 level 12/27/2021   Ductal carcinoma in situ (DCIS) of left breast 01/19/2018   Generalized anxiety disorder 11/07/2017   Depression 11/07/2017   Insomnia 11/07/2017   Cervical spondylosis 03/19/2013    PCP: Creola Corn  REFERRING PROVIDER: Trudee Grip  REFERRING DIAG: R TKA 09/09/22  THERAPY DIAG:  S/P total knee arthroplasty, right  Acute pain of right knee  Stiffness of right knee, not elsewhere classified  Muscle weakness (generalized)  Other abnormalities of gait and mobility  Localized edema  Rationale for Evaluation and Treatment: Rehabilitation  ONSET DATE: 09/09/22  SUBJECTIVE:   SUBJECTIVE STATEMENT :  I have really been trying to do more at home  OUTPATIENT PHYSICAL THERAPY LOWER EXTREMITY TREATMENT    Patient Name: [name] MRN: [id number] DOB:[birth year], 62 y.o., female Today's Date: 11/04/2022  END OF SESSION:  PT End of Session - 11/04/22 1720     Visit Number 21    Date for PT Re-Evaluation 12/05/22    Authorization Type UHC    PT Start Time 1611    PT Stop Time 1653    PT Time Calculation (min) 42 min    Activity Tolerance Patient limited by pain;Patient tolerated treatment well    Behavior During Therapy [facility] for tasks assessed/performed                       Past Medical History:  Diagnosis Date   Arthritis    Back pain of lumbar region with sciatica    Cancer (HCC)    breast   Depression    Diabetes mellitus, type II (HCC)    Family history of anesthesia complication    grandfather died under anesthesia 70's- had heart issues   H/O carpal tunnel repair 2014   Headache(784.0)    Hyperlipidemia    Hypertension    Hypothyroidism    Medial meniscus tear    PONV (postoperative nausea and vomiting)    Sleep apnea    cpap since 10   Past Surgical History:  Procedure Laterality Date   BILATERAL TOTAL MASTECTOMY WITH AXILLARY LYMPH NODE DISSECTION     BREAST RECONSTRUCTION     CERVICAL DISC ARTHROPLASTY N/A 03/19/2013   Procedure: CERVICAL FIVE TO SIX, CERVICAL SIX TO SEVEN CERVICAL ANTERIOR DISC ARTHROPLASTY;  Surgeon: Barnett Abu, MD;  Location: [facility];  Service: Neurosurgery;  Laterality: N/A;  C56 C67 artificial disc replacement   CESAREAN SECTION     COLONOSCOPY     DILATATION & CURETTAGE/HYSTEROSCOPY WITH TRUECLEAR N/A 11/25/2013   Procedure: DILATATION & CURETTAGE/HYSTEROSCOPY WITH TRUCLEAR;  Surgeon: Meriel Pica, MD;  Location: [facility];  Service: Gynecology;  Laterality: N/A;   ECTOPIC PREGNANCY SURGERY     GALLBLADDER SURGERY     MOUTH SURGERY     child- dog bite   SPINAL FUSION     2023   STERIOD INJECTION Left 09/09/2022   Procedure: LEFT KNEE STEROID  INJECTION;  Surgeon: Ollen Gross, MD;  Location: [facility];  Service: Orthopedics;  Laterality: Left;  left knee injection 0928   TONSILLECTOMY     TOTAL KNEE ARTHROPLASTY Right 09/09/2022   Procedure: TOTAL KNEE ARTHROPLASTY;  Surgeon: Ollen Gross, MD;  Location: [facility];  Service: Orthopedics;  Laterality: Right;   TUBAL LIGATION     Patient Active Problem List   Diagnosis Date Noted   OA (osteoarthritis) of knee 09/09/2022   Exertional dyspnea 04/17/2022   Elevated coronary artery calcium score 04/17/2022   Spondylolisthesis at L4-L5 level 12/27/2021   Ductal carcinoma in situ (DCIS) of left breast 01/19/2018   Generalized anxiety disorder 11/07/2017   Depression 11/07/2017   Insomnia 11/07/2017   Cervical spondylosis 03/19/2013    PCP: Creola Corn  REFERRING PROVIDER: Trudee Grip  REFERRING DIAG: R TKA 09/09/22  THERAPY DIAG:  S/P total knee arthroplasty, right  Acute pain of right knee  Stiffness of right knee, not elsewhere classified  Muscle weakness (generalized)  Other abnormalities of gait and mobility  Localized edema  Rationale for Evaluation and Treatment: Rehabilitation  ONSET DATE: 09/09/22  SUBJECTIVE:   SUBJECTIVE STATEMENT :  I have really been trying to do more at home

## 2022-11-06 ENCOUNTER — Encounter: Payer: Self-pay | Admitting: Professional Counselor

## 2022-11-06 ENCOUNTER — Encounter: Payer: Self-pay | Admitting: Physical Therapy

## 2022-11-06 ENCOUNTER — Ambulatory Visit: Payer: No Typology Code available for payment source | Admitting: Physical Therapy

## 2022-11-06 ENCOUNTER — Ambulatory Visit (INDEPENDENT_AMBULATORY_CARE_PROVIDER_SITE_OTHER): Payer: No Typology Code available for payment source | Admitting: Professional Counselor

## 2022-11-06 DIAGNOSIS — F411 Generalized anxiety disorder: Secondary | ICD-10-CM

## 2022-11-06 DIAGNOSIS — Z96651 Presence of right artificial knee joint: Secondary | ICD-10-CM | POA: Diagnosis not present

## 2022-11-06 DIAGNOSIS — R2689 Other abnormalities of gait and mobility: Secondary | ICD-10-CM

## 2022-11-06 DIAGNOSIS — M25661 Stiffness of right knee, not elsewhere classified: Secondary | ICD-10-CM

## 2022-11-06 DIAGNOSIS — M6281 Muscle weakness (generalized): Secondary | ICD-10-CM

## 2022-11-06 DIAGNOSIS — M25561 Pain in right knee: Secondary | ICD-10-CM

## 2022-11-06 DIAGNOSIS — R600 Localized edema: Secondary | ICD-10-CM

## 2022-11-06 NOTE — Progress Notes (Signed)
      [facility] Counselor/Therapist Progress Note  Patient ID: [name], MRN: [id number],    Date: 11/06/2022  Time Spent: 2:08p - 3:07p   Treatment Type: Family with patient  Pt present with spouse for session.  Reported Symptoms: worries, restlessness, nervousness, stress, self esteem concerns, interpersonal concerns   Mental Status Exam:  Appearance:   Neat     Behavior:  Appropriate, Sharing, and Motivated  Motor:  Normal  Speech/Language:   Clear and Coherent and Normal Rate  Affect:  Appropriate and Congruent  Mood:  normal  Thought process:  normal  Thought content:    WNL  Sensory/Perceptual disturbances:    WNL  Orientation:  Sound  Attention:  Good  Concentration:  Good  Memory:  WNL  Fund of knowledge:   Good  Insight:    Good  Judgment:   Good  Impulse Control:  Good   Risk Assessment: Danger to Self:  No Self-injurious Behavior: No Danger to Others: No Duty to Warn:no Physical Aggression / Violence:No  Access to Firearms a concern: No  Gang Involvement:No   Subjective: Pt presented to session with spouse. Pt processed experience of recent knee replacement surgery and the hardship to her well-being energetically, however she reported healing and recovery to be going well. Pt reported spouse to have moved back into family home and to no longer be maintaining an apartment, as they voiced having found an increased measure of reconciliation and commitment in the marriage. Pt and spouse voiced nurturing capacity to pick their battles as discussed last session. Pt and spouse discussed financial concerns and spending habits, and counselor helped develop strategies to mitigate strain. They also discussed dynamics between them regarding interrupting and emotionally heightening patterns, and counselor suggested strategies such as as slowing down, pausing, engaging in reflective listening, and striving to be calmly attuned with one another.  Interventions:  Solution-Oriented/Positive Psychology, Humanistic/Existential, Insight-Oriented, and Family Systems  Diagnosis:   ICD-10-CM   1. Generalized anxiety disorder  F41.1       Plan: Pt is scheduled for a follow-up; continue process work and developing coping skills.   Gaspar Bidding, [facility]

## 2022-11-06 NOTE — Therapy (Signed)
to >/= 0-120 deg to allow for normal gait and stair mechanics. Baseline: 30-0-60 10/09/22: 16-0-78  11/01/22: 14-0-83 Goal status: IN PROGRESS  4.  Patient will demonstrate improved functional LE strength as demonstrated by 5/5. Baseline: 2/5 Goal status: MET 3+/5 10/09/22, 5/5 11/01/22   5.  Patient will be able to ambulate 500' with LRAD and normal gait pattern without increased pain to access community.  Baseline: using RW small in home ambulation distances Goal status: IN PROGRESS 10/09/22, 11/01/22 ongoing  6.  Patient will report 32 on FOTO (patient reported outcome measure) to demonstrate improved functional ability. Baseline: 4, 40  Goal status: IN PROGRESS 11/01/22  PLAN:  PT FREQUENCY: 2x/week  PT DURATION: 12 weeks  PLANNED INTERVENTIONS: Therapeutic exercises, Therapeutic activity, Neuromuscular re-education, Balance training, Gait training, Patient/Family education, Self Care, Joint mobilization, Stair training, Electrical stimulation, Cryotherapy, Moist heat, scar mobilization, Vasopneumatic device, and Manual therapy  PLAN FOR NEXT SESSION: continue to push the ROM, she seems to do better with some goals and visual goals to see where we are trying to take her, then she will not resist as much, will see the surgeon next Tuesday 11/12/22 Patient Details  Name: [name] MRN: [id number] Date of Birth: 1960/05/21 Referring Provider:   Ollen Gross, MD  Encounter Date: 11/06/2022   Jearld Lesch, PT 11/06/2022, 10:33 AM  to >/= 0-120 deg to allow for normal gait and stair mechanics. Baseline: 30-0-60 10/09/22: 16-0-78  11/01/22: 14-0-83 Goal status: IN PROGRESS  4.  Patient will demonstrate improved functional LE strength as demonstrated by 5/5. Baseline: 2/5 Goal status: MET 3+/5 10/09/22, 5/5 11/01/22   5.  Patient will be able to ambulate 500' with LRAD and normal gait pattern without increased pain to access community.  Baseline: using RW small in home ambulation distances Goal status: IN PROGRESS 10/09/22, 11/01/22 ongoing  6.  Patient will report 32 on FOTO (patient reported outcome measure) to demonstrate improved functional ability. Baseline: 4, 40  Goal status: IN PROGRESS 11/01/22  PLAN:  PT FREQUENCY: 2x/week  PT DURATION: 12 weeks  PLANNED INTERVENTIONS: Therapeutic exercises, Therapeutic activity, Neuromuscular re-education, Balance training, Gait training, Patient/Family education, Self Care, Joint mobilization, Stair training, Electrical stimulation, Cryotherapy, Moist heat, scar mobilization, Vasopneumatic device, and Manual therapy  PLAN FOR NEXT SESSION: continue to push the ROM, she seems to do better with some goals and visual goals to see where we are trying to take her, then she will not resist as much, will see the surgeon next Tuesday 11/12/22 Patient Details  Name: [name] MRN: [id number] Date of Birth: 1960/05/21 Referring Provider:   Ollen Gross, MD  Encounter Date: 11/06/2022   Jearld Lesch, PT 11/06/2022, 10:33 AM  to >/= 0-120 deg to allow for normal gait and stair mechanics. Baseline: 30-0-60 10/09/22: 16-0-78  11/01/22: 14-0-83 Goal status: IN PROGRESS  4.  Patient will demonstrate improved functional LE strength as demonstrated by 5/5. Baseline: 2/5 Goal status: MET 3+/5 10/09/22, 5/5 11/01/22   5.  Patient will be able to ambulate 500' with LRAD and normal gait pattern without increased pain to access community.  Baseline: using RW small in home ambulation distances Goal status: IN PROGRESS 10/09/22, 11/01/22 ongoing  6.  Patient will report 32 on FOTO (patient reported outcome measure) to demonstrate improved functional ability. Baseline: 4, 40  Goal status: IN PROGRESS 11/01/22  PLAN:  PT FREQUENCY: 2x/week  PT DURATION: 12 weeks  PLANNED INTERVENTIONS: Therapeutic exercises, Therapeutic activity, Neuromuscular re-education, Balance training, Gait training, Patient/Family education, Self Care, Joint mobilization, Stair training, Electrical stimulation, Cryotherapy, Moist heat, scar mobilization, Vasopneumatic device, and Manual therapy  PLAN FOR NEXT SESSION: continue to push the ROM, she seems to do better with some goals and visual goals to see where we are trying to take her, then she will not resist as much, will see the surgeon next Tuesday 11/12/22 Patient Details  Name: [name] MRN: [id number] Date of Birth: 1960/05/21 Referring Provider:   Ollen Gross, MD  Encounter Date: 11/06/2022   Jearld Lesch, PT 11/06/2022, 10:33 AM  to >/= 0-120 deg to allow for normal gait and stair mechanics. Baseline: 30-0-60 10/09/22: 16-0-78  11/01/22: 14-0-83 Goal status: IN PROGRESS  4.  Patient will demonstrate improved functional LE strength as demonstrated by 5/5. Baseline: 2/5 Goal status: MET 3+/5 10/09/22, 5/5 11/01/22   5.  Patient will be able to ambulate 500' with LRAD and normal gait pattern without increased pain to access community.  Baseline: using RW small in home ambulation distances Goal status: IN PROGRESS 10/09/22, 11/01/22 ongoing  6.  Patient will report 32 on FOTO (patient reported outcome measure) to demonstrate improved functional ability. Baseline: 4, 40  Goal status: IN PROGRESS 11/01/22  PLAN:  PT FREQUENCY: 2x/week  PT DURATION: 12 weeks  PLANNED INTERVENTIONS: Therapeutic exercises, Therapeutic activity, Neuromuscular re-education, Balance training, Gait training, Patient/Family education, Self Care, Joint mobilization, Stair training, Electrical stimulation, Cryotherapy, Moist heat, scar mobilization, Vasopneumatic device, and Manual therapy  PLAN FOR NEXT SESSION: continue to push the ROM, she seems to do better with some goals and visual goals to see where we are trying to take her, then she will not resist as much, will see the surgeon next Tuesday 11/12/22 Patient Details  Name: [name] MRN: [id number] Date of Birth: 1960/05/21 Referring Provider:   Ollen Gross, MD  Encounter Date: 11/06/2022   Jearld Lesch, PT 11/06/2022, 10:33 AM  OUTPATIENT PHYSICAL THERAPY LOWER EXTREMITY TREATMENT    Patient Name: [name] MRN: [id number] DOB:[birth year], 62 y.o., female Today's Date: 11/06/2022  END OF SESSION:  PT End of Session - 11/06/22 1032     Visit Number 22    Date for PT Re-Evaluation 12/05/22    Authorization Type U[employer]    PT Start Time 0930    PT Stop Time 1020    PT Time Calculation (min) 50 min    Activity Tolerance Patient limited by pain;Patient tolerated treatment well    Behavior During Therapy [facility] for tasks assessed/performed                       Past Medical History:  Diagnosis Date   Arthritis    Back pain of lumbar region with sciatica    Cancer (HCC)    breast   Depression    Diabetes mellitus, type II (HCC)    Family history of anesthesia complication    grandfather died under anesthesia 70's- had heart issues   H/O carpal tunnel repair 2014   Headache(784.0)    Hyperlipidemia    Hypertension    Hypothyroidism    Medial meniscus tear    PONV (postoperative nausea and vomiting)    Sleep apnea    cpap since 10   Past Surgical History:  Procedure Laterality Date   BILATERAL TOTAL MASTECTOMY WITH AXILLARY LYMPH NODE DISSECTION     BREAST RECONSTRUCTION     CERVICAL DISC ARTHROPLASTY N/A 03/19/2013   Procedure: CERVICAL FIVE TO SIX, CERVICAL SIX TO SEVEN CERVICAL ANTERIOR DISC ARTHROPLASTY;  Surgeon: Barnett Abu, MD;  Location: [facility];  Service: Neurosurgery;  Laterality: N/A;  C56 C67 artificial disc replacement   CESAREAN SECTION     COLONOSCOPY     DILATATION & CURETTAGE/HYSTEROSCOPY WITH TRUECLEAR N/A 11/25/2013   Procedure: DILATATION & CURETTAGE/HYSTEROSCOPY WITH TRUCLEAR;  Surgeon: Meriel Pica, MD;  Location: [facility];  Service: Gynecology;  Laterality: N/A;   ECTOPIC PREGNANCY SURGERY     GALLBLADDER SURGERY     MOUTH SURGERY     child- dog bite   SPINAL FUSION     2023   STERIOD INJECTION Left 09/09/2022   Procedure: LEFT KNEE STEROID  INJECTION;  Surgeon: Ollen Gross, MD;  Location: [facility];  Service: Orthopedics;  Laterality: Left;  left knee injection 0928   TONSILLECTOMY     TOTAL KNEE ARTHROPLASTY Right 09/09/2022   Procedure: TOTAL KNEE ARTHROPLASTY;  Surgeon: Ollen Gross, MD;  Location: [facility];  Service: Orthopedics;  Laterality: Right;   TUBAL LIGATION     Patient Active Problem List   Diagnosis Date Noted   OA (osteoarthritis) of knee 09/09/2022   Exertional dyspnea 04/17/2022   Elevated coronary artery calcium score 04/17/2022   Spondylolisthesis at L4-L5 level 12/27/2021   Ductal carcinoma in situ (DCIS) of left breast 01/19/2018   Generalized anxiety disorder 11/07/2017   Depression 11/07/2017   Insomnia 11/07/2017   Cervical spondylosis 03/19/2013    PCP: Creola Corn  REFERRING PROVIDER: Trudee Grip  REFERRING DIAG: R TKA 09/09/22  THERAPY DIAG:  S/P total knee arthroplasty, right  Acute pain of right knee  Stiffness of right knee, not elsewhere classified  Muscle weakness (generalized)  Other abnormalities of gait and mobility  Localized edema  Rationale for Evaluation and Treatment: Rehabilitation  ONSET DATE: 09/09/22  SUBJECTIVE:   SUBJECTIVE STATEMENT :  I am here early today so I may be stiffer

## 2022-11-06 NOTE — Therapy (Signed)
10/04/22, MET 10/09/22   LONG TERM GOALS: Target date: 12/05/22  Patient will be independent with advanced/ongoing HEP to improve outcomes and carryover.  Goal status: ongoing 11/06/22  2.  Patient will report at least 75% improvement in R knee pain to improve QOL. Baseline: 10/10 Goal status: IN PROGRESS  50% better 11/01/22  3.  Patient will demonstrate improved R knee AROM to >/= 0-120 deg to allow for normal gait and stair mechanics. Baseline: 30-0-60 10/09/22: 16-0-78  11/01/22: 14-0-83 Goal status: IN PROGRESS  4.  Patient will demonstrate improved functional LE strength as demonstrated by 5/5. Baseline: 2/5 Goal status: MET 3+/5 10/09/22, 5/5 11/01/22   5.  Patient will be able to ambulate 500' with LRAD and normal gait pattern without increased pain to access community.  Baseline: using RW small in home ambulation distances Goal status: IN PROGRESS 10/09/22, 11/01/22 ongoing  6.  Patient will report 40 on FOTO (patient reported outcome measure) to demonstrate improved functional ability. Baseline: 4, 40  Goal status: IN PROGRESS 11/01/22  PLAN:  PT FREQUENCY: 2x/week  PT DURATION: 12 weeks  PLANNED INTERVENTIONS: Therapeutic exercises, Therapeutic activity, Neuromuscular re-education, Balance training, Gait training, Patient/Family education, Self Care, Joint mobilization, Stair training, Electrical stimulation, Cryotherapy, Moist heat, scar mobilization, Vasopneumatic  device, and Manual therapy  PLAN FOR NEXT SESSION: continue to push the ROM, she seems to do better with some goals and visual goals to see where we are trying to take her, then she will not resist as much, will see the surgeon next Tuesday 11/12/22   Patient Details  Name: [name] MRN: [id number] Date of Birth: [birth year] Referring Provider:  Ollen Gross, MD  Encounter Date: 11/08/2022   Cassie Freer, PT 11/08/2022, 11:01 AM  10/04/22, MET 10/09/22   LONG TERM GOALS: Target date: 12/05/22  Patient will be independent with advanced/ongoing HEP to improve outcomes and carryover.  Goal status: ongoing 11/06/22  2.  Patient will report at least 75% improvement in R knee pain to improve QOL. Baseline: 10/10 Goal status: IN PROGRESS  50% better 11/01/22  3.  Patient will demonstrate improved R knee AROM to >/= 0-120 deg to allow for normal gait and stair mechanics. Baseline: 30-0-60 10/09/22: 16-0-78  11/01/22: 14-0-83 Goal status: IN PROGRESS  4.  Patient will demonstrate improved functional LE strength as demonstrated by 5/5. Baseline: 2/5 Goal status: MET 3+/5 10/09/22, 5/5 11/01/22   5.  Patient will be able to ambulate 500' with LRAD and normal gait pattern without increased pain to access community.  Baseline: using RW small in home ambulation distances Goal status: IN PROGRESS 10/09/22, 11/01/22 ongoing  6.  Patient will report 40 on FOTO (patient reported outcome measure) to demonstrate improved functional ability. Baseline: 4, 40  Goal status: IN PROGRESS 11/01/22  PLAN:  PT FREQUENCY: 2x/week  PT DURATION: 12 weeks  PLANNED INTERVENTIONS: Therapeutic exercises, Therapeutic activity, Neuromuscular re-education, Balance training, Gait training, Patient/Family education, Self Care, Joint mobilization, Stair training, Electrical stimulation, Cryotherapy, Moist heat, scar mobilization, Vasopneumatic  device, and Manual therapy  PLAN FOR NEXT SESSION: continue to push the ROM, she seems to do better with some goals and visual goals to see where we are trying to take her, then she will not resist as much, will see the surgeon next Tuesday 11/12/22   Patient Details  Name: [name] MRN: [id number] Date of Birth: [birth year] Referring Provider:  Ollen Gross, MD  Encounter Date: 11/08/2022   Cassie Freer, PT 11/08/2022, 11:01 AM  OUTPATIENT PHYSICAL THERAPY LOWER EXTREMITY TREATMENT    Patient Name: [name] MRN: [id number] DOB:1960/05/22, 62 y.o., female Today's Date: 11/08/2022  END OF SESSION:  PT End of Session - 11/08/22 1015     Visit Number 23    Date for PT Re-Evaluation 12/05/22    Authorization Type [employer]    PT Start Time 1015    PT Stop Time 1100    PT Time Calculation (min) 45 min    Activity Tolerance Patient limited by pain;Patient tolerated treatment well    Behavior During Therapy [facility] for tasks assessed/performed                        Past Medical History:  Diagnosis Date   Arthritis    Back pain of lumbar region with sciatica    Cancer (HCC)    breast   Depression    Diabetes mellitus, type II (HCC)    Family history of anesthesia complication    grandfather died under anesthesia 70's- had heart issues   H/O carpal tunnel repair 2014   Headache(784.0)    Hyperlipidemia    Hypertension    Hypothyroidism    Medial meniscus tear    PONV (postoperative nausea and vomiting)    Sleep apnea    cpap since 10   Past Surgical History:  Procedure Laterality Date   BILATERAL TOTAL MASTECTOMY WITH AXILLARY LYMPH NODE DISSECTION     BREAST RECONSTRUCTION     CERVICAL DISC ARTHROPLASTY N/A 03/19/2013   Procedure: CERVICAL FIVE TO SIX, CERVICAL SIX TO SEVEN CERVICAL ANTERIOR DISC ARTHROPLASTY;  Surgeon: Barnett Abu, MD;  Location: [facility];  Service: Neurosurgery;  Laterality: N/A;  C56 C67 artificial disc replacement   CESAREAN SECTION     COLONOSCOPY     DILATATION & CURETTAGE/HYSTEROSCOPY WITH TRUECLEAR N/A 11/25/2013   Procedure: DILATATION & CURETTAGE/HYSTEROSCOPY WITH TRUCLEAR;  Surgeon: Meriel Pica, MD;  Location: [facility];  Service: Gynecology;  Laterality: N/A;   ECTOPIC PREGNANCY SURGERY     GALLBLADDER SURGERY     MOUTH SURGERY     child- dog bite   SPINAL FUSION     2023   STERIOD INJECTION Left 09/09/2022   Procedure: LEFT KNEE STEROID  INJECTION;  Surgeon: Ollen Gross, MD;  Location: [facility];  Service: Orthopedics;  Laterality: Left;  left knee injection 0928   TONSILLECTOMY     TOTAL KNEE ARTHROPLASTY Right 09/09/2022   Procedure: TOTAL KNEE ARTHROPLASTY;  Surgeon: Ollen Gross, MD;  Location: [facility];  Service: Orthopedics;  Laterality: Right;   TUBAL LIGATION     Patient Active Problem List   Diagnosis Date Noted   OA (osteoarthritis) of knee 09/09/2022   Exertional dyspnea 04/17/2022   Elevated coronary artery calcium score 04/17/2022   Spondylolisthesis at L4-L5 level 12/27/2021   Ductal carcinoma in situ (DCIS) of left breast 01/19/2018   Generalized anxiety disorder 11/07/2017   Depression 11/07/2017   Insomnia 11/07/2017   Cervical spondylosis 03/19/2013    PCP: Creola Corn  REFERRING PROVIDER: Trudee Grip  REFERRING DIAG: R TKA 09/09/22  THERAPY DIAG:  S/P total knee arthroplasty, right  Acute pain of right knee  Stiffness of right knee, not elsewhere classified  Muscle weakness (generalized)  Other abnormalities of gait and mobility  Localized edema  Rationale for Evaluation and Treatment: Rehabilitation  ONSET DATE: 09/09/22  SUBJECTIVE:   SUBJECTIVE STATEMENT :  I am walking better. It did wake me up  OUTPATIENT PHYSICAL THERAPY LOWER EXTREMITY TREATMENT    Patient Name: [name] MRN: [id number] DOB:1960/05/22, 62 y.o., female Today's Date: 11/08/2022  END OF SESSION:  PT End of Session - 11/08/22 1015     Visit Number 23    Date for PT Re-Evaluation 12/05/22    Authorization Type U[employer]    PT Start Time 1015    PT Stop Time 1100    PT Time Calculation (min) 45 min    Activity Tolerance Patient limited by pain;Patient tolerated treatment well    Behavior During Therapy [facility] for tasks assessed/performed                        Past Medical History:  Diagnosis Date   Arthritis    Back pain of lumbar region with sciatica    Cancer (HCC)    breast   Depression    Diabetes mellitus, type II (HCC)    Family history of anesthesia complication    grandfather died under anesthesia 70's- had heart issues   H/O carpal tunnel repair 2014   Headache(784.0)    Hyperlipidemia    Hypertension    Hypothyroidism    Medial meniscus tear    PONV (postoperative nausea and vomiting)    Sleep apnea    cpap since 10   Past Surgical History:  Procedure Laterality Date   BILATERAL TOTAL MASTECTOMY WITH AXILLARY LYMPH NODE DISSECTION     BREAST RECONSTRUCTION     CERVICAL DISC ARTHROPLASTY N/A 03/19/2013   Procedure: CERVICAL FIVE TO SIX, CERVICAL SIX TO SEVEN CERVICAL ANTERIOR DISC ARTHROPLASTY;  Surgeon: Barnett Abu, MD;  Location: [facility];  Service: Neurosurgery;  Laterality: N/A;  C56 C67 artificial disc replacement   CESAREAN SECTION     COLONOSCOPY     DILATATION & CURETTAGE/HYSTEROSCOPY WITH TRUECLEAR N/A 11/25/2013   Procedure: DILATATION & CURETTAGE/HYSTEROSCOPY WITH TRUCLEAR;  Surgeon: Meriel Pica, MD;  Location: [facility];  Service: Gynecology;  Laterality: N/A;   ECTOPIC PREGNANCY SURGERY     GALLBLADDER SURGERY     MOUTH SURGERY     child- dog bite   SPINAL FUSION     2023   STERIOD INJECTION Left 09/09/2022   Procedure: LEFT KNEE STEROID  INJECTION;  Surgeon: Ollen Gross, MD;  Location: [facility];  Service: Orthopedics;  Laterality: Left;  left knee injection 0928   TONSILLECTOMY     TOTAL KNEE ARTHROPLASTY Right 09/09/2022   Procedure: TOTAL KNEE ARTHROPLASTY;  Surgeon: Ollen Gross, MD;  Location: [facility];  Service: Orthopedics;  Laterality: Right;   TUBAL LIGATION     Patient Active Problem List   Diagnosis Date Noted   OA (osteoarthritis) of knee 09/09/2022   Exertional dyspnea 04/17/2022   Elevated coronary artery calcium score 04/17/2022   Spondylolisthesis at L4-L5 level 12/27/2021   Ductal carcinoma in situ (DCIS) of left breast 01/19/2018   Generalized anxiety disorder 11/07/2017   Depression 11/07/2017   Insomnia 11/07/2017   Cervical spondylosis 03/19/2013    PCP: Creola Corn  REFERRING PROVIDER: Trudee Grip  REFERRING DIAG: R TKA 09/09/22  THERAPY DIAG:  S/P total knee arthroplasty, right  Acute pain of right knee  Stiffness of right knee, not elsewhere classified  Muscle weakness (generalized)  Other abnormalities of gait and mobility  Localized edema  Rationale for Evaluation and Treatment: Rehabilitation  ONSET DATE: 09/09/22  SUBJECTIVE:   SUBJECTIVE STATEMENT :  I am walking better. It did wake me up  10/04/22, MET 10/09/22   LONG TERM GOALS: Target date: 12/05/22  Patient will be independent with advanced/ongoing HEP to improve outcomes and carryover.  Goal status: ongoing 11/06/22  2.  Patient will report at least 75% improvement in R knee pain to improve QOL. Baseline: 10/10 Goal status: IN PROGRESS  50% better 11/01/22  3.  Patient will demonstrate improved R knee AROM to >/= 0-120 deg to allow for normal gait and stair mechanics. Baseline: 30-0-60 10/09/22: 16-0-78  11/01/22: 14-0-83 Goal status: IN PROGRESS  4.  Patient will demonstrate improved functional LE strength as demonstrated by 5/5. Baseline: 2/5 Goal status: MET 3+/5 10/09/22, 5/5 11/01/22   5.  Patient will be able to ambulate 500' with LRAD and normal gait pattern without increased pain to access community.  Baseline: using RW small in home ambulation distances Goal status: IN PROGRESS 10/09/22, 11/01/22 ongoing  6.  Patient will report 40 on FOTO (patient reported outcome measure) to demonstrate improved functional ability. Baseline: 4, 40  Goal status: IN PROGRESS 11/01/22  PLAN:  PT FREQUENCY: 2x/week  PT DURATION: 12 weeks  PLANNED INTERVENTIONS: Therapeutic exercises, Therapeutic activity, Neuromuscular re-education, Balance training, Gait training, Patient/Family education, Self Care, Joint mobilization, Stair training, Electrical stimulation, Cryotherapy, Moist heat, scar mobilization, Vasopneumatic  device, and Manual therapy  PLAN FOR NEXT SESSION: continue to push the ROM, she seems to do better with some goals and visual goals to see where we are trying to take her, then she will not resist as much, will see the surgeon next Tuesday 11/12/22   Patient Details  Name: [name] MRN: [id number] Date of Birth: [birth year] Referring Provider:  Ollen Gross, MD  Encounter Date: 11/08/2022   Cassie Freer, PT 11/08/2022, 11:01 AM

## 2022-11-08 ENCOUNTER — Ambulatory Visit: Payer: No Typology Code available for payment source

## 2022-11-08 DIAGNOSIS — R600 Localized edema: Secondary | ICD-10-CM

## 2022-11-08 DIAGNOSIS — M6281 Muscle weakness (generalized): Secondary | ICD-10-CM

## 2022-11-08 DIAGNOSIS — Z96651 Presence of right artificial knee joint: Secondary | ICD-10-CM

## 2022-11-08 DIAGNOSIS — R2689 Other abnormalities of gait and mobility: Secondary | ICD-10-CM

## 2022-11-08 DIAGNOSIS — M25561 Pain in right knee: Secondary | ICD-10-CM

## 2022-11-08 DIAGNOSIS — M25661 Stiffness of right knee, not elsewhere classified: Secondary | ICD-10-CM

## 2022-11-11 ENCOUNTER — Ambulatory Visit: Payer: No Typology Code available for payment source | Admitting: Physical Therapy

## 2022-11-11 ENCOUNTER — Ambulatory Visit: Payer: No Typology Code available for payment source | Admitting: Professional Counselor

## 2022-11-11 ENCOUNTER — Encounter: Payer: Self-pay | Admitting: Physical Therapy

## 2022-11-11 DIAGNOSIS — R600 Localized edema: Secondary | ICD-10-CM

## 2022-11-11 DIAGNOSIS — Z96651 Presence of right artificial knee joint: Secondary | ICD-10-CM

## 2022-11-11 DIAGNOSIS — R2689 Other abnormalities of gait and mobility: Secondary | ICD-10-CM

## 2022-11-11 DIAGNOSIS — M6281 Muscle weakness (generalized): Secondary | ICD-10-CM

## 2022-11-11 DIAGNOSIS — M25561 Pain in right knee: Secondary | ICD-10-CM

## 2022-11-11 DIAGNOSIS — M25661 Stiffness of right knee, not elsewhere classified: Secondary | ICD-10-CM

## 2022-11-11 NOTE — Therapy (Signed)
by 5/5. Baseline: 2/5 Goal status: MET 3+/5 10/09/22, 5/5 11/01/22   5.  Patient will be able to ambulate 500' with LRAD and normal gait pattern without increased pain to access community.  Baseline: using RW small in home ambulation distances Goal status: IN PROGRESS 10/09/22, 11/01/22 ongoing  6.  Patient will report 47 on FOTO (patient reported outcome measure) to demonstrate improved functional ability. Baseline: 4, 40  Goal status: IN PROGRESS 11/01/22  PLAN:  PT FREQUENCY: 2x/week  PT DURATION: 12 weeks  PLANNED INTERVENTIONS: Therapeutic exercises, Therapeutic activity, Neuromuscular re-education, Balance training, Gait training, Patient/Family education, Self Care, Joint mobilization, Stair training, Electrical stimulation, Cryotherapy, Moist heat, scar mobilization, Vasopneumatic device, and Manual therapy  PLAN FOR NEXT SESSION: continue to push the ROM, she seems to do better with some goals and visual goals to see where we are trying to take her, then she will not resist as much, will see the surgeon next Tuesday 11/12/22   Patient Details  Name: [name] MRN: [id number] Date of Birth: 02-12-1960 Referring Provider:  Ollen Gross, MD  Encounter Date: 11/11/2022   Jearld Lesch, PT 11/11/2022, 4:19 PM  OUTPATIENT PHYSICAL THERAPY LOWER EXTREMITY TREATMENT    Patient Name: [name] MRN: [id number] DOB:[birth year], 62 y.o., female Today's Date: 11/11/2022  END OF SESSION:  PT End of Session - 11/11/22 1618     Visit Number 24    Date for PT Re-Evaluation 12/05/22    Authorization Type [employer]    PT Start Time 1615    PT Stop Time 1700    PT Time Calculation (min) 45 min    Activity Tolerance Patient limited by pain;Patient tolerated treatment well    Behavior During Therapy [facility] for tasks assessed/performed                        Past Medical History:  Diagnosis Date   Arthritis    Back pain of lumbar region with sciatica    Cancer (HCC)    breast   Depression    Diabetes mellitus, type II (HCC)    Family history of anesthesia complication    grandfather died under anesthesia 70's- had heart issues   H/O carpal tunnel repair 2014   Headache(784.0)    Hyperlipidemia    Hypertension    Hypothyroidism    Medial meniscus tear    PONV (postoperative nausea and vomiting)    Sleep apnea    cpap since 10   Past Surgical History:  Procedure Laterality Date   BILATERAL TOTAL MASTECTOMY WITH AXILLARY LYMPH NODE DISSECTION     BREAST RECONSTRUCTION     CERVICAL DISC ARTHROPLASTY N/A 03/19/2013   Procedure: CERVICAL FIVE TO SIX, CERVICAL SIX TO SEVEN CERVICAL ANTERIOR DISC ARTHROPLASTY;  Surgeon: Barnett Abu, MD;  Location: [facility];  Service: Neurosurgery;  Laterality: N/A;  C56 C67 artificial disc replacement   CESAREAN SECTION     COLONOSCOPY     DILATATION & CURETTAGE/HYSTEROSCOPY WITH TRUECLEAR N/A 11/25/2013   Procedure: DILATATION & CURETTAGE/HYSTEROSCOPY WITH TRUCLEAR;  Surgeon: Meriel Pica, MD;  Location: [facility];  Service: Gynecology;  Laterality: N/A;   ECTOPIC PREGNANCY SURGERY     GALLBLADDER SURGERY     MOUTH SURGERY     child- dog bite   SPINAL FUSION     2023   STERIOD INJECTION Left 09/09/2022   Procedure: LEFT KNEE STEROID  INJECTION;  Surgeon: Ollen Gross, MD;  Location: [facility];  Service: Orthopedics;  Laterality: Left;  left knee injection 0928   TONSILLECTOMY     TOTAL KNEE ARTHROPLASTY Right 09/09/2022   Procedure: TOTAL KNEE ARTHROPLASTY;  Surgeon: Ollen Gross, MD;  Location: [facility];  Service: Orthopedics;  Laterality: Right;   TUBAL LIGATION     Patient Active Problem List   Diagnosis Date Noted   OA (osteoarthritis) of knee 09/09/2022   Exertional dyspnea 04/17/2022   Elevated coronary artery calcium score 04/17/2022   Spondylolisthesis at L4-L5 level 12/27/2021   Ductal carcinoma in situ (DCIS) of left breast 01/19/2018   Generalized anxiety disorder 11/07/2017   Depression 11/07/2017   Insomnia 11/07/2017   Cervical spondylosis 03/19/2013    PCP: Creola Corn  REFERRING PROVIDER: Trudee Grip  REFERRING DIAG: R TKA 09/09/22  THERAPY DIAG:  S/P total knee arthroplasty, right  Acute pain of right knee  Stiffness of right knee, not elsewhere classified  Muscle weakness (generalized)  Localized edema  Other abnormalities of gait and mobility  Rationale for Evaluation and Treatment: Rehabilitation  ONSET DATE: 09/09/22  SUBJECTIVE:   SUBJECTIVE STATEMENT :  I am stiff and sore and but seems to  by 5/5. Baseline: 2/5 Goal status: MET 3+/5 10/09/22, 5/5 11/01/22   5.  Patient will be able to ambulate 500' with LRAD and normal gait pattern without increased pain to access community.  Baseline: using RW small in home ambulation distances Goal status: IN PROGRESS 10/09/22, 11/01/22 ongoing  6.  Patient will report 47 on FOTO (patient reported outcome measure) to demonstrate improved functional ability. Baseline: 4, 40  Goal status: IN PROGRESS 11/01/22  PLAN:  PT FREQUENCY: 2x/week  PT DURATION: 12 weeks  PLANNED INTERVENTIONS: Therapeutic exercises, Therapeutic activity, Neuromuscular re-education, Balance training, Gait training, Patient/Family education, Self Care, Joint mobilization, Stair training, Electrical stimulation, Cryotherapy, Moist heat, scar mobilization, Vasopneumatic device, and Manual therapy  PLAN FOR NEXT SESSION: continue to push the ROM, she seems to do better with some goals and visual goals to see where we are trying to take her, then she will not resist as much, will see the surgeon next Tuesday 11/12/22   Patient Details  Name: [name] MRN: [id number] Date of Birth: 02-12-1960 Referring Provider:  Ollen Gross, MD  Encounter Date: 11/11/2022   Jearld Lesch, PT 11/11/2022, 4:19 PM  OUTPATIENT PHYSICAL THERAPY LOWER EXTREMITY TREATMENT    Patient Name: [name] MRN: [id number] DOB:[birth year], 62 y.o., female Today's Date: 11/11/2022  END OF SESSION:  PT End of Session - 11/11/22 1618     Visit Number 24    Date for PT Re-Evaluation 12/05/22    Authorization Type U[employer]    PT Start Time 1615    PT Stop Time 1700    PT Time Calculation (min) 45 min    Activity Tolerance Patient limited by pain;Patient tolerated treatment well    Behavior During Therapy [facility] for tasks assessed/performed                        Past Medical History:  Diagnosis Date   Arthritis    Back pain of lumbar region with sciatica    Cancer (HCC)    breast   Depression    Diabetes mellitus, type II (HCC)    Family history of anesthesia complication    grandfather died under anesthesia 70's- had heart issues   H/O carpal tunnel repair 2014   Headache(784.0)    Hyperlipidemia    Hypertension    Hypothyroidism    Medial meniscus tear    PONV (postoperative nausea and vomiting)    Sleep apnea    cpap since 10   Past Surgical History:  Procedure Laterality Date   BILATERAL TOTAL MASTECTOMY WITH AXILLARY LYMPH NODE DISSECTION     BREAST RECONSTRUCTION     CERVICAL DISC ARTHROPLASTY N/A 03/19/2013   Procedure: CERVICAL FIVE TO SIX, CERVICAL SIX TO SEVEN CERVICAL ANTERIOR DISC ARTHROPLASTY;  Surgeon: Barnett Abu, MD;  Location: [facility];  Service: Neurosurgery;  Laterality: N/A;  C56 C67 artificial disc replacement   CESAREAN SECTION     COLONOSCOPY     DILATATION & CURETTAGE/HYSTEROSCOPY WITH TRUECLEAR N/A 11/25/2013   Procedure: DILATATION & CURETTAGE/HYSTEROSCOPY WITH TRUCLEAR;  Surgeon: Meriel Pica, MD;  Location: [facility];  Service: Gynecology;  Laterality: N/A;   ECTOPIC PREGNANCY SURGERY     GALLBLADDER SURGERY     MOUTH SURGERY     child- dog bite   SPINAL FUSION     2023   STERIOD INJECTION Left 09/09/2022   Procedure: LEFT KNEE STEROID  INJECTION;  Surgeon: Ollen Gross, MD;  Location: [facility];  Service: Orthopedics;  Laterality: Left;  left knee injection 0928   TONSILLECTOMY     TOTAL KNEE ARTHROPLASTY Right 09/09/2022   Procedure: TOTAL KNEE ARTHROPLASTY;  Surgeon: Ollen Gross, MD;  Location: [facility];  Service: Orthopedics;  Laterality: Right;   TUBAL LIGATION     Patient Active Problem List   Diagnosis Date Noted   OA (osteoarthritis) of knee 09/09/2022   Exertional dyspnea 04/17/2022   Elevated coronary artery calcium score 04/17/2022   Spondylolisthesis at L4-L5 level 12/27/2021   Ductal carcinoma in situ (DCIS) of left breast 01/19/2018   Generalized anxiety disorder 11/07/2017   Depression 11/07/2017   Insomnia 11/07/2017   Cervical spondylosis 03/19/2013    PCP: Creola Corn  REFERRING PROVIDER: Trudee Grip  REFERRING DIAG: R TKA 09/09/22  THERAPY DIAG:  S/P total knee arthroplasty, right  Acute pain of right knee  Stiffness of right knee, not elsewhere classified  Muscle weakness (generalized)  Localized edema  Other abnormalities of gait and mobility  Rationale for Evaluation and Treatment: Rehabilitation  ONSET DATE: 09/09/22  SUBJECTIVE:   SUBJECTIVE STATEMENT :  I am stiff and sore and but seems to  by 5/5. Baseline: 2/5 Goal status: MET 3+/5 10/09/22, 5/5 11/01/22   5.  Patient will be able to ambulate 500' with LRAD and normal gait pattern without increased pain to access community.  Baseline: using RW small in home ambulation distances Goal status: IN PROGRESS 10/09/22, 11/01/22 ongoing  6.  Patient will report 47 on FOTO (patient reported outcome measure) to demonstrate improved functional ability. Baseline: 4, 40  Goal status: IN PROGRESS 11/01/22  PLAN:  PT FREQUENCY: 2x/week  PT DURATION: 12 weeks  PLANNED INTERVENTIONS: Therapeutic exercises, Therapeutic activity, Neuromuscular re-education, Balance training, Gait training, Patient/Family education, Self Care, Joint mobilization, Stair training, Electrical stimulation, Cryotherapy, Moist heat, scar mobilization, Vasopneumatic device, and Manual therapy  PLAN FOR NEXT SESSION: continue to push the ROM, she seems to do better with some goals and visual goals to see where we are trying to take her, then she will not resist as much, will see the surgeon next Tuesday 11/12/22   Patient Details  Name: [name] MRN: [id number] Date of Birth: 02-12-1960 Referring Provider:  Ollen Gross, MD  Encounter Date: 11/11/2022   Jearld Lesch, PT 11/11/2022, 4:19 PM

## 2022-11-13 ENCOUNTER — Encounter: Payer: Self-pay | Admitting: Physical Therapy

## 2022-11-13 ENCOUNTER — Ambulatory Visit: Payer: No Typology Code available for payment source | Admitting: Physical Therapy

## 2022-11-13 DIAGNOSIS — R2689 Other abnormalities of gait and mobility: Secondary | ICD-10-CM

## 2022-11-13 DIAGNOSIS — M6281 Muscle weakness (generalized): Secondary | ICD-10-CM

## 2022-11-13 DIAGNOSIS — R600 Localized edema: Secondary | ICD-10-CM

## 2022-11-13 DIAGNOSIS — M25661 Stiffness of right knee, not elsewhere classified: Secondary | ICD-10-CM

## 2022-11-13 DIAGNOSIS — M25561 Pain in right knee: Secondary | ICD-10-CM

## 2022-11-13 DIAGNOSIS — Z96651 Presence of right artificial knee joint: Secondary | ICD-10-CM

## 2022-11-13 NOTE — Therapy (Signed)
QOL. Baseline: 10/10 Goal status: IN PROGRESS  50% better 11/01/22  3.  Patient will demonstrate improved R knee AROM to >/= 0-120 deg to allow for normal gait and stair mechanics. Baseline: 30-0-60 10/09/22: 16-0-78  11/01/22: 14-0-83 Goal status: IN PROGRESS  4.  Patient will demonstrate improved functional LE strength as demonstrated by 5/5. Baseline: 2/5 Goal status: MET 3+/5 10/09/22, 5/5 11/01/22   5.  Patient will be able to ambulate 500' with LRAD and normal gait pattern without increased pain to access community.  Baseline: using RW small in home ambulation distances Goal status: progressing 11/13/22  6.  Patient will report 33 on FOTO (patient reported outcome measure) to demonstrate improved functional ability. Baseline: 4, 40  Goal status: IN PROGRESS 11/01/22  PLAN:  PT FREQUENCY: 2x/week  PT DURATION: 12 weeks  PLANNED INTERVENTIONS: Therapeutic exercises, Therapeutic activity, Neuromuscular re-education, Balance training, Gait training, Patient/Family education, Self Care, Joint mobilization, Stair training, Electrical stimulation, Cryotherapy, Moist heat, scar mobilization, Vasopneumatic device, and Manual therapy  PLAN FOR NEXT SESSION: PA feels that she is starting to improve she gave another 4 weeks to see where we can get to since the  manipulation has some danger   Patient Details  Name: [name] MRN: [id number] Date of Birth: 06-01-1960 Referring Provider:  Ollen Gross, MD  Encounter Date: 11/13/2022   Jearld Lesch, PT 11/13/2022, 4:12 PM  OUTPATIENT PHYSICAL THERAPY LOWER EXTREMITY TREATMENT    Patient Name: [name] MRN: [id number] DOB:Oct 13, 1960, 62 y.o., female Today's Date: 11/13/2022  END OF SESSION:  PT End of Session - 11/13/22 1612     Visit Number 25    Date for PT Re-Evaluation 12/05/22    Authorization Type [employer]    PT Start Time 1610    PT Stop Time 1700    PT Time Calculation (min) 50 min    Activity Tolerance Patient limited by pain;Patient tolerated treatment well    Behavior During Therapy [facility] for tasks assessed/performed                        Past Medical History:  Diagnosis Date   Arthritis    Back pain of lumbar region with sciatica    Cancer (HCC)    breast   Depression    Diabetes mellitus, type II (HCC)    Family history of anesthesia complication    grandfather died under anesthesia 70's- had heart issues   H/O carpal tunnel repair 2014   Headache(784.0)    Hyperlipidemia    Hypertension    Hypothyroidism    Medial meniscus tear    PONV (postoperative nausea and vomiting)    Sleep apnea    cpap since 10   Past Surgical History:  Procedure Laterality Date   BILATERAL TOTAL MASTECTOMY WITH AXILLARY LYMPH NODE DISSECTION     BREAST RECONSTRUCTION     CERVICAL DISC ARTHROPLASTY N/A 03/19/2013   Procedure: CERVICAL FIVE TO SIX, CERVICAL SIX TO SEVEN CERVICAL ANTERIOR DISC ARTHROPLASTY;  Surgeon: Barnett Abu, MD;  Location: [facility];  Service: Neurosurgery;  Laterality: N/A;  C56 C67 artificial disc replacement   CESAREAN SECTION     COLONOSCOPY     DILATATION & CURETTAGE/HYSTEROSCOPY WITH TRUECLEAR N/A 11/25/2013   Procedure: DILATATION & CURETTAGE/HYSTEROSCOPY WITH TRUCLEAR;  Surgeon: Meriel Pica, MD;  Location: [facility];  Service: Gynecology;  Laterality: N/A;   ECTOPIC PREGNANCY SURGERY     GALLBLADDER SURGERY     MOUTH SURGERY     child- dog bite   SPINAL FUSION     2023   STERIOD INJECTION Left 09/09/2022   Procedure: LEFT KNEE STEROID  INJECTION;  Surgeon: Ollen Gross, MD;  Location: [facility];  Service: Orthopedics;  Laterality: Left;  left knee injection 0928   TONSILLECTOMY     TOTAL KNEE ARTHROPLASTY Right 09/09/2022   Procedure: TOTAL KNEE ARTHROPLASTY;  Surgeon: Ollen Gross, MD;  Location: [facility];  Service: Orthopedics;  Laterality: Right;   TUBAL LIGATION     Patient Active Problem List   Diagnosis Date Noted   OA (osteoarthritis) of knee 09/09/2022   Exertional dyspnea 04/17/2022   Elevated coronary artery calcium score 04/17/2022   Spondylolisthesis at L4-L5 level 12/27/2021   Ductal carcinoma in situ (DCIS) of left breast 01/19/2018   Generalized anxiety disorder 11/07/2017   Depression 11/07/2017   Insomnia 11/07/2017   Cervical spondylosis 03/19/2013    PCP: Creola Corn  REFERRING PROVIDER: Trudee Grip  REFERRING DIAG: R TKA 09/09/22  THERAPY DIAG:  S/P total knee arthroplasty, right  Acute pain of right knee  Stiffness of right knee, not elsewhere classified  Muscle weakness (generalized)  Localized edema  Other abnormalities of gait and mobility  Rationale for Evaluation and Treatment: Rehabilitation  ONSET DATE: 09/09/22  SUBJECTIVE:   SUBJECTIVE STATEMENT :  I saw the PA, feels like I have made  QOL. Baseline: 10/10 Goal status: IN PROGRESS  50% better 11/01/22  3.  Patient will demonstrate improved R knee AROM to >/= 0-120 deg to allow for normal gait and stair mechanics. Baseline: 30-0-60 10/09/22: 16-0-78  11/01/22: 14-0-83 Goal status: IN PROGRESS  4.  Patient will demonstrate improved functional LE strength as demonstrated by 5/5. Baseline: 2/5 Goal status: MET 3+/5 10/09/22, 5/5 11/01/22   5.  Patient will be able to ambulate 500' with LRAD and normal gait pattern without increased pain to access community.  Baseline: using RW small in home ambulation distances Goal status: progressing 11/13/22  6.  Patient will report 33 on FOTO (patient reported outcome measure) to demonstrate improved functional ability. Baseline: 4, 40  Goal status: IN PROGRESS 11/01/22  PLAN:  PT FREQUENCY: 2x/week  PT DURATION: 12 weeks  PLANNED INTERVENTIONS: Therapeutic exercises, Therapeutic activity, Neuromuscular re-education, Balance training, Gait training, Patient/Family education, Self Care, Joint mobilization, Stair training, Electrical stimulation, Cryotherapy, Moist heat, scar mobilization, Vasopneumatic device, and Manual therapy  PLAN FOR NEXT SESSION: PA feels that she is starting to improve she gave another 4 weeks to see where we can get to since the  manipulation has some danger   Patient Details  Name: [name] MRN: [id number] Date of Birth: 06-01-1960 Referring Provider:  Ollen Gross, MD  Encounter Date: 11/13/2022   Jearld Lesch, PT 11/13/2022, 4:12 PM  OUTPATIENT PHYSICAL THERAPY LOWER EXTREMITY TREATMENT    Patient Name: [name] MRN: [id number] DOB:Oct 13, 1960, 62 y.o., female Today's Date: 11/13/2022  END OF SESSION:  PT End of Session - 11/13/22 1612     Visit Number 25    Date for PT Re-Evaluation 12/05/22    Authorization Type [employer]    PT Start Time 1610    PT Stop Time 1700    PT Time Calculation (min) 50 min    Activity Tolerance Patient limited by pain;Patient tolerated treatment well    Behavior During Therapy [facility] for tasks assessed/performed                        Past Medical History:  Diagnosis Date   Arthritis    Back pain of lumbar region with sciatica    Cancer (HCC)    breast   Depression    Diabetes mellitus, type II (HCC)    Family history of anesthesia complication    grandfather died under anesthesia 70's- had heart issues   H/O carpal tunnel repair 2014   Headache(784.0)    Hyperlipidemia    Hypertension    Hypothyroidism    Medial meniscus tear    PONV (postoperative nausea and vomiting)    Sleep apnea    cpap since 10   Past Surgical History:  Procedure Laterality Date   BILATERAL TOTAL MASTECTOMY WITH AXILLARY LYMPH NODE DISSECTION     BREAST RECONSTRUCTION     CERVICAL DISC ARTHROPLASTY N/A 03/19/2013   Procedure: CERVICAL FIVE TO SIX, CERVICAL SIX TO SEVEN CERVICAL ANTERIOR DISC ARTHROPLASTY;  Surgeon: Barnett Abu, MD;  Location: [facility];  Service: Neurosurgery;  Laterality: N/A;  C56 C67 artificial disc replacement   CESAREAN SECTION     COLONOSCOPY     DILATATION & CURETTAGE/HYSTEROSCOPY WITH TRUECLEAR N/A 11/25/2013   Procedure: DILATATION & CURETTAGE/HYSTEROSCOPY WITH TRUCLEAR;  Surgeon: Meriel Pica, MD;  Location: [facility];  Service: Gynecology;  Laterality: N/A;   ECTOPIC PREGNANCY SURGERY     GALLBLADDER SURGERY     MOUTH SURGERY     child- dog bite   SPINAL FUSION     2023   STERIOD INJECTION Left 09/09/2022   Procedure: LEFT KNEE STEROID  INJECTION;  Surgeon: Ollen Gross, MD;  Location: [facility];  Service: Orthopedics;  Laterality: Left;  left knee injection 0928   TONSILLECTOMY     TOTAL KNEE ARTHROPLASTY Right 09/09/2022   Procedure: TOTAL KNEE ARTHROPLASTY;  Surgeon: Ollen Gross, MD;  Location: [facility];  Service: Orthopedics;  Laterality: Right;   TUBAL LIGATION     Patient Active Problem List   Diagnosis Date Noted   OA (osteoarthritis) of knee 09/09/2022   Exertional dyspnea 04/17/2022   Elevated coronary artery calcium score 04/17/2022   Spondylolisthesis at L4-L5 level 12/27/2021   Ductal carcinoma in situ (DCIS) of left breast 01/19/2018   Generalized anxiety disorder 11/07/2017   Depression 11/07/2017   Insomnia 11/07/2017   Cervical spondylosis 03/19/2013    PCP: Creola Corn  REFERRING PROVIDER: Trudee Grip  REFERRING DIAG: R TKA 09/09/22  THERAPY DIAG:  S/P total knee arthroplasty, right  Acute pain of right knee  Stiffness of right knee, not elsewhere classified  Muscle weakness (generalized)  Localized edema  Other abnormalities of gait and mobility  Rationale for Evaluation and Treatment: Rehabilitation  ONSET DATE: 09/09/22  SUBJECTIVE:   SUBJECTIVE STATEMENT :  I saw the PA, feels like I have made  QOL. Baseline: 10/10 Goal status: IN PROGRESS  50% better 11/01/22  3.  Patient will demonstrate improved R knee AROM to >/= 0-120 deg to allow for normal gait and stair mechanics. Baseline: 30-0-60 10/09/22: 16-0-78  11/01/22: 14-0-83 Goal status: IN PROGRESS  4.  Patient will demonstrate improved functional LE strength as demonstrated by 5/5. Baseline: 2/5 Goal status: MET 3+/5 10/09/22, 5/5 11/01/22   5.  Patient will be able to ambulate 500' with LRAD and normal gait pattern without increased pain to access community.  Baseline: using RW small in home ambulation distances Goal status: progressing 11/13/22  6.  Patient will report 33 on FOTO (patient reported outcome measure) to demonstrate improved functional ability. Baseline: 4, 40  Goal status: IN PROGRESS 11/01/22  PLAN:  PT FREQUENCY: 2x/week  PT DURATION: 12 weeks  PLANNED INTERVENTIONS: Therapeutic exercises, Therapeutic activity, Neuromuscular re-education, Balance training, Gait training, Patient/Family education, Self Care, Joint mobilization, Stair training, Electrical stimulation, Cryotherapy, Moist heat, scar mobilization, Vasopneumatic device, and Manual therapy  PLAN FOR NEXT SESSION: PA feels that she is starting to improve she gave another 4 weeks to see where we can get to since the  manipulation has some danger   Patient Details  Name: [name] MRN: [id number] Date of Birth: 06-01-1960 Referring Provider:  Ollen Gross, MD  Encounter Date: 11/13/2022   Jearld Lesch, PT 11/13/2022, 4:12 PM

## 2022-11-15 ENCOUNTER — Ambulatory Visit: Payer: No Typology Code available for payment source

## 2022-11-15 DIAGNOSIS — Z96651 Presence of right artificial knee joint: Secondary | ICD-10-CM | POA: Diagnosis not present

## 2022-11-15 DIAGNOSIS — M25661 Stiffness of right knee, not elsewhere classified: Secondary | ICD-10-CM

## 2022-11-15 DIAGNOSIS — M25561 Pain in right knee: Secondary | ICD-10-CM

## 2022-11-15 DIAGNOSIS — R600 Localized edema: Secondary | ICD-10-CM

## 2022-11-15 DIAGNOSIS — M6281 Muscle weakness (generalized): Secondary | ICD-10-CM

## 2022-11-15 NOTE — Therapy (Signed)
OUTPATIENT PHYSICAL THERAPY LOWER EXTREMITY TREATMENT    Patient Name: [name] MRN: [id number] DOB:[birth year], 62 y.o., female Today's Date: 11/15/2022  END OF SESSION:  PT End of Session - 11/15/22 1058     Visit Number 26    Date for PT Re-Evaluation 12/05/22    Authorization Type [employer]    PT Start Time 1058    PT Stop Time 1145    PT Time Calculation (min) 47 min    Activity Tolerance Patient limited by pain;Patient tolerated treatment well    Behavior During Therapy [facility] for tasks assessed/performed                         Past Medical History:  Diagnosis Date   Arthritis    Back pain of lumbar region with sciatica    Cancer (HCC)    breast   Depression    Diabetes mellitus, type II (HCC)    Family history of anesthesia complication    grandfather died under anesthesia 70's- had heart issues   H/O carpal tunnel repair 2014   Headache(784.0)    Hyperlipidemia    Hypertension    Hypothyroidism    Medial meniscus tear    PONV (postoperative nausea and vomiting)    Sleep apnea    cpap since 10   Past Surgical History:  Procedure Laterality Date   BILATERAL TOTAL MASTECTOMY WITH AXILLARY LYMPH NODE DISSECTION     BREAST RECONSTRUCTION     CERVICAL DISC ARTHROPLASTY N/A 03/19/2013   Procedure: CERVICAL FIVE TO SIX, CERVICAL SIX TO SEVEN CERVICAL ANTERIOR DISC ARTHROPLASTY;  Surgeon: Barnett Abu, MD;  Location: [facility];  Service: Neurosurgery;  Laterality: N/A;  C56 C67 artificial disc replacement   CESAREAN SECTION     COLONOSCOPY     DILATATION & CURETTAGE/HYSTEROSCOPY WITH TRUECLEAR N/A 11/25/2013   Procedure: DILATATION & CURETTAGE/HYSTEROSCOPY WITH TRUCLEAR;  Surgeon: Meriel Pica, MD;  Location: [facility];  Service: Gynecology;  Laterality: N/A;   ECTOPIC PREGNANCY SURGERY     GALLBLADDER SURGERY     MOUTH SURGERY     child- dog bite   SPINAL FUSION     2023   STERIOD INJECTION Left 09/09/2022   Procedure: LEFT KNEE STEROID  INJECTION;  Surgeon: Ollen Gross, MD;  Location: [facility];  Service: Orthopedics;  Laterality: Left;  left knee injection 0928   TONSILLECTOMY     TOTAL KNEE ARTHROPLASTY Right 09/09/2022   Procedure: TOTAL KNEE ARTHROPLASTY;  Surgeon: Ollen Gross, MD;  Location: [facility];  Service: Orthopedics;  Laterality: Right;   TUBAL LIGATION     Patient Active Problem List   Diagnosis Date Noted   OA (osteoarthritis) of knee 09/09/2022   Exertional dyspnea 04/17/2022   Elevated coronary artery calcium score 04/17/2022   Spondylolisthesis at L4-L5 level 12/27/2021   Ductal carcinoma in situ (DCIS) of left breast 01/19/2018   Generalized anxiety disorder 11/07/2017   Depression 11/07/2017   Insomnia 11/07/2017   Cervical spondylosis 03/19/2013    PCP: Creola Corn  REFERRING PROVIDER: Trudee Grip  REFERRING DIAG: R TKA 09/09/22  THERAPY DIAG:  S/P total knee arthroplasty, right  Acute pain of right knee  Stiffness of right knee, not elsewhere classified  Muscle weakness (generalized)  Localized edema  Rationale for Evaluation and Treatment: Rehabilitation  ONSET DATE: 09/09/22  SUBJECTIVE:   SUBJECTIVE STATEMENT :  It is less painful and I think I am able to bend a little more  will be do TUG < 30s Baseline: 1 min 28s  Goal status: ongoing 10/07/22, 18.57s MET 10/09/22  3.  Patient will be able to do 5 sit to stands  Baseline: unable to do  Goal status: progressing 10/04/22, MET 10/09/22   LONG TERM GOALS: Target date: 12/05/22  Patient will be independent with advanced/ongoing HEP to improve outcomes and carryover.  Goal status: ongoing 11/06/22  2.  Patient will report at least 75% improvement in R knee pain to improve QOL. Baseline: 10/10 Goal status: IN PROGRESS  50% better 11/01/22  3.  Patient will demonstrate improved R knee AROM to >/= 0-120 deg to allow for normal gait and stair mechanics. Baseline: 30-0-60 10/09/22: 16-0-78  11/01/22: 14-0-83 Goal status: IN PROGRESS  4.  Patient will demonstrate improved functional LE strength as demonstrated by 5/5. Baseline: 2/5 Goal status: MET 3+/5 10/09/22, 5/5 11/01/22   5.  Patient will be able to ambulate 500' with LRAD and normal gait pattern without increased pain to access community.  Baseline: using RW small in home ambulation distances Goal status: progressing 11/13/22  6.  Patient will report 44 on FOTO (patient reported outcome measure) to demonstrate improved functional ability. Baseline: 4, 40  Goal status: IN PROGRESS 11/01/22  PLAN:  PT FREQUENCY: 2x/week  PT DURATION: 12 weeks  PLANNED  INTERVENTIONS: Therapeutic exercises, Therapeutic activity, Neuromuscular re-education, Balance training, Gait training, Patient/Family education, Self Care, Joint mobilization, Stair training, Electrical stimulation, Cryotherapy, Moist heat, scar mobilization, Vasopneumatic device, and Manual therapy  PLAN FOR NEXT SESSION: continue to work on her flexion ROM and gait pattern compensations   Patient Details  Name: [name] MRN: [id number] Date of Birth: 1960/07/10 Referring Provider:  Ollen Gross, MD  Encounter Date: 11/15/2022   Cassie Freer, PT 11/15/2022, 11:47 AM  will be do TUG < 30s Baseline: 1 min 28s  Goal status: ongoing 10/07/22, 18.57s MET 10/09/22  3.  Patient will be able to do 5 sit to stands  Baseline: unable to do  Goal status: progressing 10/04/22, MET 10/09/22   LONG TERM GOALS: Target date: 12/05/22  Patient will be independent with advanced/ongoing HEP to improve outcomes and carryover.  Goal status: ongoing 11/06/22  2.  Patient will report at least 75% improvement in R knee pain to improve QOL. Baseline: 10/10 Goal status: IN PROGRESS  50% better 11/01/22  3.  Patient will demonstrate improved R knee AROM to >/= 0-120 deg to allow for normal gait and stair mechanics. Baseline: 30-0-60 10/09/22: 16-0-78  11/01/22: 14-0-83 Goal status: IN PROGRESS  4.  Patient will demonstrate improved functional LE strength as demonstrated by 5/5. Baseline: 2/5 Goal status: MET 3+/5 10/09/22, 5/5 11/01/22   5.  Patient will be able to ambulate 500' with LRAD and normal gait pattern without increased pain to access community.  Baseline: using RW small in home ambulation distances Goal status: progressing 11/13/22  6.  Patient will report 44 on FOTO (patient reported outcome measure) to demonstrate improved functional ability. Baseline: 4, 40  Goal status: IN PROGRESS 11/01/22  PLAN:  PT FREQUENCY: 2x/week  PT DURATION: 12 weeks  PLANNED  INTERVENTIONS: Therapeutic exercises, Therapeutic activity, Neuromuscular re-education, Balance training, Gait training, Patient/Family education, Self Care, Joint mobilization, Stair training, Electrical stimulation, Cryotherapy, Moist heat, scar mobilization, Vasopneumatic device, and Manual therapy  PLAN FOR NEXT SESSION: continue to work on her flexion ROM and gait pattern compensations   Patient Details  Name: [name] MRN: [id number] Date of Birth: 1960/07/10 Referring Provider:  Ollen Gross, MD  Encounter Date: 11/15/2022   Cassie Freer, PT 11/15/2022, 11:47 AM  will be do TUG < 30s Baseline: 1 min 28s  Goal status: ongoing 10/07/22, 18.57s MET 10/09/22  3.  Patient will be able to do 5 sit to stands  Baseline: unable to do  Goal status: progressing 10/04/22, MET 10/09/22   LONG TERM GOALS: Target date: 12/05/22  Patient will be independent with advanced/ongoing HEP to improve outcomes and carryover.  Goal status: ongoing 11/06/22  2.  Patient will report at least 75% improvement in R knee pain to improve QOL. Baseline: 10/10 Goal status: IN PROGRESS  50% better 11/01/22  3.  Patient will demonstrate improved R knee AROM to >/= 0-120 deg to allow for normal gait and stair mechanics. Baseline: 30-0-60 10/09/22: 16-0-78  11/01/22: 14-0-83 Goal status: IN PROGRESS  4.  Patient will demonstrate improved functional LE strength as demonstrated by 5/5. Baseline: 2/5 Goal status: MET 3+/5 10/09/22, 5/5 11/01/22   5.  Patient will be able to ambulate 500' with LRAD and normal gait pattern without increased pain to access community.  Baseline: using RW small in home ambulation distances Goal status: progressing 11/13/22  6.  Patient will report 44 on FOTO (patient reported outcome measure) to demonstrate improved functional ability. Baseline: 4, 40  Goal status: IN PROGRESS 11/01/22  PLAN:  PT FREQUENCY: 2x/week  PT DURATION: 12 weeks  PLANNED  INTERVENTIONS: Therapeutic exercises, Therapeutic activity, Neuromuscular re-education, Balance training, Gait training, Patient/Family education, Self Care, Joint mobilization, Stair training, Electrical stimulation, Cryotherapy, Moist heat, scar mobilization, Vasopneumatic device, and Manual therapy  PLAN FOR NEXT SESSION: continue to work on her flexion ROM and gait pattern compensations   Patient Details  Name: [name] MRN: [id number] Date of Birth: 1960/07/10 Referring Provider:  Ollen Gross, MD  Encounter Date: 11/15/2022   Cassie Freer, PT 11/15/2022, 11:47 AM  will be do TUG < 30s Baseline: 1 min 28s  Goal status: ongoing 10/07/22, 18.57s MET 10/09/22  3.  Patient will be able to do 5 sit to stands  Baseline: unable to do  Goal status: progressing 10/04/22, MET 10/09/22   LONG TERM GOALS: Target date: 12/05/22  Patient will be independent with advanced/ongoing HEP to improve outcomes and carryover.  Goal status: ongoing 11/06/22  2.  Patient will report at least 75% improvement in R knee pain to improve QOL. Baseline: 10/10 Goal status: IN PROGRESS  50% better 11/01/22  3.  Patient will demonstrate improved R knee AROM to >/= 0-120 deg to allow for normal gait and stair mechanics. Baseline: 30-0-60 10/09/22: 16-0-78  11/01/22: 14-0-83 Goal status: IN PROGRESS  4.  Patient will demonstrate improved functional LE strength as demonstrated by 5/5. Baseline: 2/5 Goal status: MET 3+/5 10/09/22, 5/5 11/01/22   5.  Patient will be able to ambulate 500' with LRAD and normal gait pattern without increased pain to access community.  Baseline: using RW small in home ambulation distances Goal status: progressing 11/13/22  6.  Patient will report 44 on FOTO (patient reported outcome measure) to demonstrate improved functional ability. Baseline: 4, 40  Goal status: IN PROGRESS 11/01/22  PLAN:  PT FREQUENCY: 2x/week  PT DURATION: 12 weeks  PLANNED  INTERVENTIONS: Therapeutic exercises, Therapeutic activity, Neuromuscular re-education, Balance training, Gait training, Patient/Family education, Self Care, Joint mobilization, Stair training, Electrical stimulation, Cryotherapy, Moist heat, scar mobilization, Vasopneumatic device, and Manual therapy  PLAN FOR NEXT SESSION: continue to work on her flexion ROM and gait pattern compensations   Patient Details  Name: [name] MRN: [id number] Date of Birth: 1960/07/10 Referring Provider:  Ollen Gross, MD  Encounter Date: 11/15/2022   Cassie Freer, PT 11/15/2022, 11:47 AM

## 2022-11-18 ENCOUNTER — Ambulatory Visit: Payer: No Typology Code available for payment source | Admitting: Physical Therapy

## 2022-11-18 ENCOUNTER — Encounter: Payer: Self-pay | Admitting: Physical Therapy

## 2022-11-18 DIAGNOSIS — R600 Localized edema: Secondary | ICD-10-CM

## 2022-11-18 DIAGNOSIS — M25661 Stiffness of right knee, not elsewhere classified: Secondary | ICD-10-CM

## 2022-11-18 DIAGNOSIS — Z96651 Presence of right artificial knee joint: Secondary | ICD-10-CM

## 2022-11-18 DIAGNOSIS — M25561 Pain in right knee: Secondary | ICD-10-CM

## 2022-11-18 DIAGNOSIS — M6281 Muscle weakness (generalized): Secondary | ICD-10-CM

## 2022-11-18 NOTE — Therapy (Signed)
OUTPATIENT PHYSICAL THERAPY LOWER EXTREMITY TREATMENT    Patient Name: [name] MRN: [id number] DOB:1960-11-30, 62 y.o., female Today's Date: 11/18/2022  END OF SESSION:  PT End of Session - 11/18/22 1704     Visit Number 27    Date for PT Re-Evaluation 12/05/22    Authorization Type [employer]    PT Start Time 1700    PT Stop Time 1745    PT Time Calculation (min) 45 min    Activity Tolerance Patient limited by pain;Patient tolerated treatment well    Behavior During Therapy [facility] for tasks assessed/performed                         Past Medical History:  Diagnosis Date   Arthritis    Back pain of lumbar region with sciatica    Cancer (HCC)    breast   Depression    Diabetes mellitus, type II (HCC)    Family history of anesthesia complication    grandfather died under anesthesia 70's- had heart issues   H/O carpal tunnel repair 2014   Headache(784.0)    Hyperlipidemia    Hypertension    Hypothyroidism    Medial meniscus tear    PONV (postoperative nausea and vomiting)    Sleep apnea    cpap since 10   Past Surgical History:  Procedure Laterality Date   BILATERAL TOTAL MASTECTOMY WITH AXILLARY LYMPH NODE DISSECTION     BREAST RECONSTRUCTION     CERVICAL DISC ARTHROPLASTY N/A 03/19/2013   Procedure: CERVICAL FIVE TO SIX, CERVICAL SIX TO SEVEN CERVICAL ANTERIOR DISC ARTHROPLASTY;  Surgeon: Barnett Abu, MD;  Location: [facility];  Service: Neurosurgery;  Laterality: N/A;  C56 C67 artificial disc replacement   CESAREAN SECTION     COLONOSCOPY     DILATATION & CURETTAGE/HYSTEROSCOPY WITH TRUECLEAR N/A 11/25/2013   Procedure: DILATATION & CURETTAGE/HYSTEROSCOPY WITH TRUCLEAR;  Surgeon: Meriel Pica, MD;  Location: [facility];  Service: Gynecology;  Laterality: N/A;   ECTOPIC PREGNANCY SURGERY     GALLBLADDER SURGERY     MOUTH SURGERY     child- dog bite   SPINAL FUSION     2023   STERIOD INJECTION Left 09/09/2022   Procedure: LEFT KNEE STEROID  INJECTION;  Surgeon: Ollen Gross, MD;  Location: [facility];  Service: Orthopedics;  Laterality: Left;  left knee injection 0928   TONSILLECTOMY     TOTAL KNEE ARTHROPLASTY Right 09/09/2022   Procedure: TOTAL KNEE ARTHROPLASTY;  Surgeon: Ollen Gross, MD;  Location: [facility];  Service: Orthopedics;  Laterality: Right;   TUBAL LIGATION     Patient Active Problem List   Diagnosis Date Noted   OA (osteoarthritis) of knee 09/09/2022   Exertional dyspnea 04/17/2022   Elevated coronary artery calcium score 04/17/2022   Spondylolisthesis at L4-L5 level 12/27/2021   Ductal carcinoma in situ (DCIS) of left breast 01/19/2018   Generalized anxiety disorder 11/07/2017   Depression 11/07/2017   Insomnia 11/07/2017   Cervical spondylosis 03/19/2013    PCP: Creola Corn  REFERRING PROVIDER: Trudee Grip  REFERRING DIAG: R TKA 09/09/22  THERAPY DIAG:  S/P total knee arthroplasty, right  Acute pain of right knee  Stiffness of right knee, not elsewhere classified  Muscle weakness (generalized)  Localized edema  Rationale for Evaluation and Treatment: Rehabilitation  ONSET DATE: 09/09/22  SUBJECTIVE:   SUBJECTIVE STATEMENT :  I had a lot of swelling in the knee and the foot this weekend, so  [name] MRN: [id number] Date of Birth: [birth year] Referring Provider:  Ollen Gross, MD  Encounter Date: 11/18/2022   Jearld Lesch, PT 11/18/2022, 5:05 PM  [name] MRN: [id number] Date of Birth: [birth year] Referring Provider:  Ollen Gross, MD  Encounter Date: 11/18/2022   Jearld Lesch, PT 11/18/2022, 5:05 PM  OUTPATIENT PHYSICAL THERAPY LOWER EXTREMITY TREATMENT    Patient Name: [name] MRN: [id number] DOB:1960-11-30, 62 y.o., female Today's Date: 11/18/2022  END OF SESSION:  PT End of Session - 11/18/22 1704     Visit Number 27    Date for PT Re-Evaluation 12/05/22    Authorization Type [employer]    PT Start Time 1700    PT Stop Time 1745    PT Time Calculation (min) 45 min    Activity Tolerance Patient limited by pain;Patient tolerated treatment well    Behavior During Therapy [facility] for tasks assessed/performed                         Past Medical History:  Diagnosis Date   Arthritis    Back pain of lumbar region with sciatica    Cancer (HCC)    breast   Depression    Diabetes mellitus, type II (HCC)    Family history of anesthesia complication    grandfather died under anesthesia 70's- had heart issues   H/O carpal tunnel repair 2014   Headache(784.0)    Hyperlipidemia    Hypertension    Hypothyroidism    Medial meniscus tear    PONV (postoperative nausea and vomiting)    Sleep apnea    cpap since 10   Past Surgical History:  Procedure Laterality Date   BILATERAL TOTAL MASTECTOMY WITH AXILLARY LYMPH NODE DISSECTION     BREAST RECONSTRUCTION     CERVICAL DISC ARTHROPLASTY N/A 03/19/2013   Procedure: CERVICAL FIVE TO SIX, CERVICAL SIX TO SEVEN CERVICAL ANTERIOR DISC ARTHROPLASTY;  Surgeon: Barnett Abu, MD;  Location: [facility];  Service: Neurosurgery;  Laterality: N/A;  C56 C67 artificial disc replacement   CESAREAN SECTION     COLONOSCOPY     DILATATION & CURETTAGE/HYSTEROSCOPY WITH TRUECLEAR N/A 11/25/2013   Procedure: DILATATION & CURETTAGE/HYSTEROSCOPY WITH TRUCLEAR;  Surgeon: Meriel Pica, MD;  Location: [facility];  Service: Gynecology;  Laterality: N/A;   ECTOPIC PREGNANCY SURGERY     GALLBLADDER SURGERY     MOUTH SURGERY     child- dog bite   SPINAL FUSION     2023   STERIOD INJECTION Left 09/09/2022   Procedure: LEFT KNEE STEROID  INJECTION;  Surgeon: Ollen Gross, MD;  Location: [facility];  Service: Orthopedics;  Laterality: Left;  left knee injection 0928   TONSILLECTOMY     TOTAL KNEE ARTHROPLASTY Right 09/09/2022   Procedure: TOTAL KNEE ARTHROPLASTY;  Surgeon: Ollen Gross, MD;  Location: [facility];  Service: Orthopedics;  Laterality: Right;   TUBAL LIGATION     Patient Active Problem List   Diagnosis Date Noted   OA (osteoarthritis) of knee 09/09/2022   Exertional dyspnea 04/17/2022   Elevated coronary artery calcium score 04/17/2022   Spondylolisthesis at L4-L5 level 12/27/2021   Ductal carcinoma in situ (DCIS) of left breast 01/19/2018   Generalized anxiety disorder 11/07/2017   Depression 11/07/2017   Insomnia 11/07/2017   Cervical spondylosis 03/19/2013    PCP: Creola Corn  REFERRING PROVIDER: Trudee Grip  REFERRING DIAG: R TKA 09/09/22  THERAPY DIAG:  S/P total knee arthroplasty, right  Acute pain of right knee  Stiffness of right knee, not elsewhere classified  Muscle weakness (generalized)  Localized edema  Rationale for Evaluation and Treatment: Rehabilitation  ONSET DATE: 09/09/22  SUBJECTIVE:   SUBJECTIVE STATEMENT :  I had a lot of swelling in the knee and the foot this weekend, so  OUTPATIENT PHYSICAL THERAPY LOWER EXTREMITY TREATMENT    Patient Name: [name] MRN: [id number] DOB:1960-11-30, 62 y.o., female Today's Date: 11/18/2022  END OF SESSION:  PT End of Session - 11/18/22 1704     Visit Number 27    Date for PT Re-Evaluation 12/05/22    Authorization Type [employer]    PT Start Time 1700    PT Stop Time 1745    PT Time Calculation (min) 45 min    Activity Tolerance Patient limited by pain;Patient tolerated treatment well    Behavior During Therapy [facility] for tasks assessed/performed                         Past Medical History:  Diagnosis Date   Arthritis    Back pain of lumbar region with sciatica    Cancer (HCC)    breast   Depression    Diabetes mellitus, type II (HCC)    Family history of anesthesia complication    grandfather died under anesthesia 70's- had heart issues   H/O carpal tunnel repair 2014   Headache(784.0)    Hyperlipidemia    Hypertension    Hypothyroidism    Medial meniscus tear    PONV (postoperative nausea and vomiting)    Sleep apnea    cpap since 10   Past Surgical History:  Procedure Laterality Date   BILATERAL TOTAL MASTECTOMY WITH AXILLARY LYMPH NODE DISSECTION     BREAST RECONSTRUCTION     CERVICAL DISC ARTHROPLASTY N/A 03/19/2013   Procedure: CERVICAL FIVE TO SIX, CERVICAL SIX TO SEVEN CERVICAL ANTERIOR DISC ARTHROPLASTY;  Surgeon: Barnett Abu, MD;  Location: [facility];  Service: Neurosurgery;  Laterality: N/A;  C56 C67 artificial disc replacement   CESAREAN SECTION     COLONOSCOPY     DILATATION & CURETTAGE/HYSTEROSCOPY WITH TRUECLEAR N/A 11/25/2013   Procedure: DILATATION & CURETTAGE/HYSTEROSCOPY WITH TRUCLEAR;  Surgeon: Meriel Pica, MD;  Location: [facility];  Service: Gynecology;  Laterality: N/A;   ECTOPIC PREGNANCY SURGERY     GALLBLADDER SURGERY     MOUTH SURGERY     child- dog bite   SPINAL FUSION     2023   STERIOD INJECTION Left 09/09/2022   Procedure: LEFT KNEE STEROID  INJECTION;  Surgeon: Ollen Gross, MD;  Location: [facility];  Service: Orthopedics;  Laterality: Left;  left knee injection 0928   TONSILLECTOMY     TOTAL KNEE ARTHROPLASTY Right 09/09/2022   Procedure: TOTAL KNEE ARTHROPLASTY;  Surgeon: Ollen Gross, MD;  Location: [facility];  Service: Orthopedics;  Laterality: Right;   TUBAL LIGATION     Patient Active Problem List   Diagnosis Date Noted   OA (osteoarthritis) of knee 09/09/2022   Exertional dyspnea 04/17/2022   Elevated coronary artery calcium score 04/17/2022   Spondylolisthesis at L4-L5 level 12/27/2021   Ductal carcinoma in situ (DCIS) of left breast 01/19/2018   Generalized anxiety disorder 11/07/2017   Depression 11/07/2017   Insomnia 11/07/2017   Cervical spondylosis 03/19/2013    PCP: Creola Corn  REFERRING PROVIDER: Trudee Grip  REFERRING DIAG: R TKA 09/09/22  THERAPY DIAG:  S/P total knee arthroplasty, right  Acute pain of right knee  Stiffness of right knee, not elsewhere classified  Muscle weakness (generalized)  Localized edema  Rationale for Evaluation and Treatment: Rehabilitation  ONSET DATE: 09/09/22  SUBJECTIVE:   SUBJECTIVE STATEMENT :  I had a lot of swelling in the knee and the foot this weekend, so

## 2022-11-20 ENCOUNTER — Encounter: Payer: Self-pay | Admitting: Physical Therapy

## 2022-11-20 ENCOUNTER — Ambulatory Visit: Payer: No Typology Code available for payment source | Admitting: Physical Therapy

## 2022-11-20 DIAGNOSIS — M25661 Stiffness of right knee, not elsewhere classified: Secondary | ICD-10-CM

## 2022-11-20 DIAGNOSIS — Z96651 Presence of right artificial knee joint: Secondary | ICD-10-CM

## 2022-11-20 DIAGNOSIS — R600 Localized edema: Secondary | ICD-10-CM

## 2022-11-20 DIAGNOSIS — M25561 Pain in right knee: Secondary | ICD-10-CM

## 2022-11-20 DIAGNOSIS — M6281 Muscle weakness (generalized): Secondary | ICD-10-CM

## 2022-11-20 NOTE — Therapy (Signed)
PLAN:  PT FREQUENCY: 2x/week  PT DURATION: 12 weeks  PLANNED INTERVENTIONS: Therapeutic exercises, Therapeutic activity, Neuromuscular re-education, Balance training, Gait training, Patient/Family education, Self Care, Joint mobilization, Stair training, Electrical stimulation, Cryotherapy, Moist heat, scar mobilization, Vasopneumatic device, and Manual therapy  PLAN FOR NEXT SESSION: continue to work on her flexion ROM and gait pattern compensations   Patient Details  Name: [name] MRN: [id number] Date of Birth: [birth year] Referring Provider:  Ollen Gross, MD  Encounter Date: 11/20/2022   Jearld Lesch, PT 11/20/2022, 5:06 PM  PLAN:  PT FREQUENCY: 2x/week  PT DURATION: 12 weeks  PLANNED INTERVENTIONS: Therapeutic exercises, Therapeutic activity, Neuromuscular re-education, Balance training, Gait training, Patient/Family education, Self Care, Joint mobilization, Stair training, Electrical stimulation, Cryotherapy, Moist heat, scar mobilization, Vasopneumatic device, and Manual therapy  PLAN FOR NEXT SESSION: continue to work on her flexion ROM and gait pattern compensations   Patient Details  Name: [name] MRN: [id number] Date of Birth: [birth year] Referring Provider:  Ollen Gross, MD  Encounter Date: 11/20/2022   Jearld Lesch, PT 11/20/2022, 5:06 PM  PLAN:  PT FREQUENCY: 2x/week  PT DURATION: 12 weeks  PLANNED INTERVENTIONS: Therapeutic exercises, Therapeutic activity, Neuromuscular re-education, Balance training, Gait training, Patient/Family education, Self Care, Joint mobilization, Stair training, Electrical stimulation, Cryotherapy, Moist heat, scar mobilization, Vasopneumatic device, and Manual therapy  PLAN FOR NEXT SESSION: continue to work on her flexion ROM and gait pattern compensations   Patient Details  Name: [name] MRN: [id number] Date of Birth: [birth year] Referring Provider:  Ollen Gross, MD  Encounter Date: 11/20/2022   Jearld Lesch, PT 11/20/2022, 5:06 PM  OUTPATIENT PHYSICAL THERAPY LOWER EXTREMITY TREATMENT    Patient Name: [name] MRN: [id number] DOB:[birth year], 62 y.o., female Today's Date: 11/20/2022  END OF SESSION:  PT End of Session - 11/20/22 1704     Visit Number 28    Date for PT Re-Evaluation 12/05/22    Authorization Type [employer]    PT Start Time 1700    PT Stop Time 1745    PT Time Calculation (min) 45 min    Activity Tolerance Patient limited by pain;Patient tolerated treatment well    Behavior During Therapy [facility] for tasks assessed/performed                         Past Medical History:  Diagnosis Date   Arthritis    Back pain of lumbar region with sciatica    Cancer (HCC)    breast   Depression    Diabetes mellitus, type II (HCC)    Family history of anesthesia complication    grandfather died under anesthesia 70's- had heart issues   H/O carpal tunnel repair 2014   Headache(784.0)    Hyperlipidemia    Hypertension    Hypothyroidism    Medial meniscus tear    PONV (postoperative nausea and vomiting)    Sleep apnea    cpap since 10   Past Surgical History:  Procedure Laterality Date   BILATERAL TOTAL MASTECTOMY WITH AXILLARY LYMPH NODE DISSECTION     BREAST RECONSTRUCTION     CERVICAL DISC ARTHROPLASTY N/A 03/19/2013   Procedure: CERVICAL FIVE TO SIX, CERVICAL SIX TO SEVEN CERVICAL ANTERIOR DISC ARTHROPLASTY;  Surgeon: Barnett Abu, MD;  Location: [facility];  Service: Neurosurgery;  Laterality: N/A;  C56 C67 artificial disc replacement   CESAREAN SECTION     COLONOSCOPY     DILATATION & CURETTAGE/HYSTEROSCOPY WITH TRUECLEAR N/A 11/25/2013   Procedure: DILATATION & CURETTAGE/HYSTEROSCOPY WITH TRUCLEAR;  Surgeon: Meriel Pica, MD;  Location: [facility];  Service: Gynecology;  Laterality: N/A;   ECTOPIC PREGNANCY SURGERY     GALLBLADDER SURGERY     MOUTH SURGERY     child- dog bite   SPINAL FUSION     2023   STERIOD INJECTION Left 09/09/2022   Procedure: LEFT KNEE STEROID  INJECTION;  Surgeon: Ollen Gross, MD;  Location: [facility];  Service: Orthopedics;  Laterality: Left;  left knee injection 0928   TONSILLECTOMY     TOTAL KNEE ARTHROPLASTY Right 09/09/2022   Procedure: TOTAL KNEE ARTHROPLASTY;  Surgeon: Ollen Gross, MD;  Location: [facility];  Service: Orthopedics;  Laterality: Right;   TUBAL LIGATION     Patient Active Problem List   Diagnosis Date Noted   OA (osteoarthritis) of knee 09/09/2022   Exertional dyspnea 04/17/2022   Elevated coronary artery calcium score 04/17/2022   Spondylolisthesis at L4-L5 level 12/27/2021   Ductal carcinoma in situ (DCIS) of left breast 01/19/2018   Generalized anxiety disorder 11/07/2017   Depression 11/07/2017   Insomnia 11/07/2017   Cervical spondylosis 03/19/2013    PCP: Creola Corn  REFERRING PROVIDER: Trudee Grip  REFERRING DIAG: R TKA 09/09/22  THERAPY DIAG:  S/P total knee arthroplasty, right  Acute pain of right knee  Stiffness of right knee, not elsewhere classified  Muscle weakness (generalized)  Localized edema  Rationale for Evaluation and Treatment: Rehabilitation  ONSET DATE: 09/09/22  SUBJECTIVE:   SUBJECTIVE STATEMENT :  I really feel stiff today  PERTINENT HISTORY: R TKA 8/19 L knee steroid injection  PLAN:  PT FREQUENCY: 2x/week  PT DURATION: 12 weeks  PLANNED INTERVENTIONS: Therapeutic exercises, Therapeutic activity, Neuromuscular re-education, Balance training, Gait training, Patient/Family education, Self Care, Joint mobilization, Stair training, Electrical stimulation, Cryotherapy, Moist heat, scar mobilization, Vasopneumatic device, and Manual therapy  PLAN FOR NEXT SESSION: continue to work on her flexion ROM and gait pattern compensations   Patient Details  Name: [name] MRN: [id number] Date of Birth: [birth year] Referring Provider:  Ollen Gross, MD  Encounter Date: 11/20/2022   Jearld Lesch, PT 11/20/2022, 5:06 PM

## 2022-11-21 NOTE — Therapy (Signed)
OUTPATIENT PHYSICAL THERAPY LOWER EXTREMITY TREATMENT    Patient Name: [name] MRN: [id number] DOB:[birth year], 62 y.o., female Today's Date: 11/22/2022  END OF SESSION:  PT End of Session - 11/22/22 1057     Visit Number 29    Date for PT Re-Evaluation 12/05/22    Authorization Type [employer]    PT Start Time 1058    PT Stop Time 1145    PT Time Calculation (min) 47 min    Activity Tolerance Patient limited by pain;Patient tolerated treatment well    Behavior During Therapy [facility] for tasks assessed/performed                          Past Medical History:  Diagnosis Date   Arthritis    Back pain of lumbar region with sciatica    Cancer (HCC)    breast   Depression    Diabetes mellitus, type II (HCC)    Family history of anesthesia complication    grandfather died under anesthesia 70's- had heart issues   H/O carpal tunnel repair 2014   Headache(784.0)    Hyperlipidemia    Hypertension    Hypothyroidism    Medial meniscus tear    PONV (postoperative nausea and vomiting)    Sleep apnea    cpap since 10   Past Surgical History:  Procedure Laterality Date   BILATERAL TOTAL MASTECTOMY WITH AXILLARY LYMPH NODE DISSECTION     BREAST RECONSTRUCTION     CERVICAL DISC ARTHROPLASTY N/A 03/19/2013   Procedure: CERVICAL FIVE TO SIX, CERVICAL SIX TO SEVEN CERVICAL ANTERIOR DISC ARTHROPLASTY;  Surgeon: Barnett Abu, MD;  Location: [facility];  Service: Neurosurgery;  Laterality: N/A;  C56 C67 artificial disc replacement   CESAREAN SECTION     COLONOSCOPY     DILATATION & CURETTAGE/HYSTEROSCOPY WITH TRUECLEAR N/A 11/25/2013   Procedure: DILATATION & CURETTAGE/HYSTEROSCOPY WITH TRUCLEAR;  Surgeon: Meriel Pica, MD;  Location: [facility];  Service: Gynecology;  Laterality: N/A;   ECTOPIC PREGNANCY SURGERY     GALLBLADDER SURGERY     MOUTH SURGERY     child- dog bite   SPINAL FUSION     2023   STERIOD INJECTION Left 09/09/2022   Procedure: LEFT KNEE STEROID  INJECTION;  Surgeon: Ollen Gross, MD;  Location: [facility];  Service: Orthopedics;  Laterality: Left;  left knee injection 0928   TONSILLECTOMY     TOTAL KNEE ARTHROPLASTY Right 09/09/2022   Procedure: TOTAL KNEE ARTHROPLASTY;  Surgeon: Ollen Gross, MD;  Location: [facility];  Service: Orthopedics;  Laterality: Right;   TUBAL LIGATION     Patient Active Problem List   Diagnosis Date Noted   OA (osteoarthritis) of knee 09/09/2022   Exertional dyspnea 04/17/2022   Elevated coronary artery calcium score 04/17/2022   Spondylolisthesis at L4-L5 level 12/27/2021   Ductal carcinoma in situ (DCIS) of left breast 01/19/2018   Generalized anxiety disorder 11/07/2017   Depression 11/07/2017   Insomnia 11/07/2017   Cervical spondylosis 03/19/2013    PCP: Creola Corn  REFERRING PROVIDER: Trudee Grip  REFERRING DIAG: R TKA 09/09/22  THERAPY DIAG:  S/P total knee arthroplasty, right  Acute pain of right knee  Stiffness of right knee, not elsewhere classified  Muscle weakness (generalized)  Localized edema  Other abnormalities of gait and mobility  Rationale for Evaluation and Treatment: Rehabilitation  ONSET DATE: 09/09/22  SUBJECTIVE:   SUBJECTIVE STATEMENT :  Didn't feel too tight this morning.  PERTINENT HISTORY: R TKA 8/19 L knee steroid injection 09/09/22  PAIN:  Are you having pain? Yes: NPRS scale: 3.5/10 Pain location: R knee Pain description: sharp medial R knee  Aggravating factors: weight bearing on the RLE, bending, moving it  Relieving factors: ice helps some, pain meds  PRECAUTIONS: None  RED FLAGS: None   WEIGHT BEARING RESTRICTIONS: No  FALLS:  Has patient fallen in last 6 months? No  LIVING ENVIRONMENT: Lives with: lives with their family and lives with their spouse Lives in: House/apartment Stairs: Yes: External: 3 steps; none Has following equipment at home: Single point cane and Walker - 2 wheeled  OCCUPATION: Not working  PLOF:  Independent  PATIENT GOALS: No pain, to keep the knee moving  NEXT MD VISIT: 09/24/22  OBJECTIVE:   PATIENT SURVEYS:  FOTO 4  COGNITION: Overall cognitive status: Within functional limits for tasks assessed      EDEMA:  Increased swelling surrounding R knee  POSTURE: rounded shoulders and flexed trunk   PALPATION: Warm and TTP  LOWER EXTREMITY ROM:  Active ROM Right eval Left eval Right 09/24/22 Right 09/26/22 Right 10/02/22  11/01/22  Hip flexion         Hip extension         Hip abduction         Hip adduction         Hip internal rotation         Hip external rotation         Knee flexion  60  75* AROM, 86 PROM 82* AROM seated per pt request  78* self AAROM, 76* AROM  PROM 87 AROM 81 PROM 90 AROM 83  Knee extension -30  -15* AROM seated, -15 PROM supine   -16* supine heel prop with quad set  AROM 25 PROM 16 AROM 14  Ankle dorsiflexion         Ankle plantarflexion         Ankle inversion         Ankle eversion          (Blank rows = not tested)  LOWER EXTREMITY MMT:  MMT Right eval Left eval  Hip flexion 2   Hip extension    Hip abduction    Hip adduction    Hip internal rotation    Hip external rotation    Knee flexion 2   Knee extension 2   Ankle dorsiflexion    Ankle plantarflexion    Ankle inversion    Ankle eversion     (Blank rows = not tested)   FUNCTIONAL TESTS:  5 times sit to stand: unable to do  Timed up and go (TUG): 1 min 28s  GAIT: Distance walked: in clinic distances Assistive device utilized: Environmental consultant - 2 wheeled Level of assistance: SBA Comments: antalgic gait, slowed cadence, step to pattern, decreased step length and time on RLE    TODAY'S TREATMENT:  DATE:  11/22/22 Bike partial revs x 6 minutes Resisted gait 20# 4 way x4 PROM contract/relax and stretching  Anterior and lateral step downs 4"  Leg  press 40# 2x10 seat 4  Leg press 20# RLE only at seat 6 x10, then seat 5 x10   11/20/22 Bike partial revs x 5 minutes Leg press 20# working at position #4 and trying to get toes down to 3-4 finger widths from the top of the foot plate Butt touches on tmill 6" step and airex Tmill push 4 x 20 seconds Passive stretch, contract relax Got her to 100 degrees flexion passively  11/18/22 Bike partial revs x 5 minutes 20# leg curls 5# leg extension with each working on end ranges and some holding to get more flexion Leg press 20#  with her having a goal to get to the bottom and have pates touch, position # 4 with 3 fingers from top was where we were able to get to today Gait with sPC, outside negotiating curb, trying to go down with the good leg, a lot of help at first but then able to relax Stairs 4" and 6" up and down working on step over step, needs min A to get to the down with the good leg first Passive stretch focus on flexion  11/15/22 NuStep L5x62mins  Bike partial ranges  Stair training in other building  PROM flexion and extension  5# leg ext RLE 2x10 20# HS curl 2x10 Calf stretch 30s x2 Walking without AD then stepping over obstacles trying to normalize pattern without stopping or thinking about which foot to use   11/13/22 Nustep level 5 x 6 minutes 20# HS curls, some passive extension stretch b/n sets 5# leg extension with passive stretch into flexion and having her try to get back to where the weight touches every rep Sit to stand with 8" step on tmill and the airex, trying to avoid hip compensation 20# leg press with position #5 3 and 4 finger widths toes from the top All gait in the clinic without device, much improved natural bend, did some backward walking, then walked outside from back door around to the front door PROM different positions  11/11/22 Nustep level 5 x 6 minutes 20# HS curls 4" toe clears 4" lunge stretch 8" step and airex on tmill squatting  down to this working on decreased hip hike Passive stretch into flexion, contract relax, STM to the right quad and scar  11/08/22 Passive ROM in multiple positions and with having a goal of touching her heel to the bar under the bed with out raising hip NuStep L5 LE only  4" step overs  Lateral heel taps 4"  Leg ext 10# x10 both legs, RLE x10  HS curls 20# 2x10 Squats with 8" and airex on top of treadmill Calf stretch on slant 15s x3   11/06/22 20# HS curls both legs 10# leg extension but really working on her taking the leg back enough for the weight to touch with the setting on the second notch Leg press 20# working on coming down lower and again having her be able to see and or hear the end  Feet on ball [facility] with a little PT overpressure for knee flexion, bridges and isometric abs Passive ROM in multiple positions and with having a goal of touching her heel to the bar under the bed with out raising hip  11/04/22 Passive stretch in sitting and in supine with leg off the table,  scar and joint mobilizations, oscillations. Contract relax, use of the gun on the thigh.  Able to get her to 95 degrees passively  11/01/22 Progress note 20th  NuStep LE only x5 mins  Bike partial revs 3 mins  PROM to R knee  Squats to low box in bars, then to lower stool  3 big laps in gym with cane   10/30/22 NuStep L5x49mins  Step over 4" in bars  Lunges on steps 4" x10 Lateral and anterior step down 4" x10 Leg press 2x10 20#, x10 work on ROM seat 5  PROM to R knee flex/ext  MET pushing into extension and then moving knee into flexion x5 Pushing foot back as far as she can on low mat table and doing STS x8   10/28/22 NuStep L5x55mins  Walking with cane  Hang leg off bed with her supine stretching hip flexor and quad and getting knee flexion PROM using bar underneath mat table as target  STS from chair 2x10 working on keeping feet even, had to block to prevent feet from sliding   10/25/22 Leg  curls 15# right only Leg extension really working on her getting back to have the weight touch so it is a smaller ROM Leg press 20 # working down to position #5 with 2-3 finger widths b/n toes and the end of the footplate PROM with her in a thomas test position some tgun on the thigh to get her to relax Seated PROM   PATIENT EDUCATION:  Education details: POC and HEP Person educated: Patient Education method: Explanation Education comprehension: verbalized understanding  HOME EXERCISE PROGRAM: Access Code: [id number]  ASSESSMENT:  CLINICAL IMPRESSION:  Patient continues to show some improvement with knee flexion although it is slow. She is a little more tight today with flexion, thinks it might be due to doing a lot of walking yesterday. She is able to do single leg pushes on leg press today for the first time and is impressed. Continue to push her to improve knee flexion as much as possible passively and actively.    OBJECTIVE IMPAIRMENTS: Abnormal gait, difficulty walking, decreased ROM, decreased strength, and pain.   ACTIVITY LIMITATIONS: bending, sitting, standing, squatting, sleeping, stairs, transfers, bathing, toileting, and locomotion level  PARTICIPATION LIMITATIONS: meal prep, cleaning, laundry, driving, shopping, and community activity  REHAB POTENTIAL: Good  CLINICAL DECISION MAKING: Stable/uncomplicated  EVALUATION COMPLEXITY: Low   GOALS: Goals reviewed with patient? Yes  SHORT TERM GOALS: Target date: 10/24/22  Patient will be independent with initial HEP. Goal status: 09/20/22 met  2.  Patient will be do TUG < 30s Baseline: 1 min 28s  Goal status: ongoing 10/07/22, 18.57s MET 10/09/22  3.  Patient will be able to do 5 sit to stands  Baseline: unable to do  Goal status: progressing 10/04/22, MET 10/09/22   LONG TERM GOALS: Target date: 12/05/22  Patient will be independent with advanced/ongoing HEP to improve outcomes and carryover.  Goal status: ongoing  11/06/22  2.  Patient will report at least 75% improvement in R knee pain to improve QOL. Baseline: 10/10 Goal status: IN PROGRESS  60% better 11/20/22  3.  Patient will demonstrate improved R knee AROM to >/= 0-120 deg to allow for normal gait and stair mechanics. Baseline: 30-0-60 10/09/22: 16-0-78  11/01/22: 14-0-83 11/20/22: 10-97 Goal status: IN PROGRESS  4.  Patient will demonstrate improved functional LE strength as demonstrated by 5/5. Baseline: 2/5 Goal status: MET 3+/5 10/09/22, 5/5 11/01/22   5.  Patient will be able to ambulate 500' with LRAD and normal gait pattern without increased pain to access community.  Baseline: using RW small in home ambulation distances Goal status: progressing 11/13/22  6.  Patient will report 13 on FOTO (patient reported outcome measure) to demonstrate improved functional ability. Baseline: 4, 40  Goal status: IN PROGRESS 11/01/22  PLAN:  PT FREQUENCY: 2x/week  PT DURATION: 12 weeks  PLANNED INTERVENTIONS: Therapeutic exercises, Therapeutic activity, Neuromuscular re-education, Balance training, Gait training, Patient/Family education, Self Care, Joint mobilization, Stair training, Electrical stimulation, Cryotherapy, Moist heat, scar mobilization, Vasopneumatic device, and Manual therapy  PLAN FOR NEXT SESSION: continue to work on her flexion ROM and gait pattern compensations   Patient Details  Name: [name] MRN: [id number] Date of Birth: 05/12/1960 Referring Provider:  Ollen Gross, MD  Encounter Date: 11/22/2022   Cassie Freer, PT 11/22/2022, 11:46 AM

## 2022-11-22 ENCOUNTER — Ambulatory Visit: Payer: No Typology Code available for payment source | Attending: Orthopedic Surgery

## 2022-11-22 DIAGNOSIS — M6281 Muscle weakness (generalized): Secondary | ICD-10-CM | POA: Diagnosis present

## 2022-11-22 DIAGNOSIS — Z96651 Presence of right artificial knee joint: Secondary | ICD-10-CM | POA: Diagnosis present

## 2022-11-22 DIAGNOSIS — R2689 Other abnormalities of gait and mobility: Secondary | ICD-10-CM | POA: Diagnosis present

## 2022-11-22 DIAGNOSIS — R600 Localized edema: Secondary | ICD-10-CM | POA: Diagnosis present

## 2022-11-22 DIAGNOSIS — M25561 Pain in right knee: Secondary | ICD-10-CM | POA: Insufficient documentation

## 2022-11-22 DIAGNOSIS — M25661 Stiffness of right knee, not elsewhere classified: Secondary | ICD-10-CM | POA: Diagnosis present

## 2022-11-25 ENCOUNTER — Encounter: Payer: Self-pay | Admitting: Physical Therapy

## 2022-11-25 ENCOUNTER — Ambulatory Visit: Payer: No Typology Code available for payment source | Admitting: Physical Therapy

## 2022-11-25 DIAGNOSIS — M25561 Pain in right knee: Secondary | ICD-10-CM

## 2022-11-25 DIAGNOSIS — Z96651 Presence of right artificial knee joint: Secondary | ICD-10-CM

## 2022-11-25 DIAGNOSIS — R600 Localized edema: Secondary | ICD-10-CM

## 2022-11-25 DIAGNOSIS — M6281 Muscle weakness (generalized): Secondary | ICD-10-CM

## 2022-11-25 DIAGNOSIS — M25661 Stiffness of right knee, not elsewhere classified: Secondary | ICD-10-CM

## 2022-11-25 NOTE — Therapy (Signed)
OUTPATIENT PHYSICAL THERAPY LOWER EXTREMITY TREATMENT    Patient Name: NABIHA PLANCK MRN: [id number] DOB:1960/10/07, 62 y.o., female Today's Date: 11/25/2022  END OF SESSION:  PT End of Session - 11/25/22 1707     Visit Number 30    Date for PT Re-Evaluation 12/05/22    Authorization Type [employer]    PT Start Time 1700    PT Stop Time 1745    PT Time Calculation (min) 45 min    Activity Tolerance Patient limited by pain;Patient tolerated treatment well    Behavior During Therapy [facility] for tasks assessed/performed                          Past Medical History:  Diagnosis Date   Arthritis    Back pain of lumbar region with sciatica    Cancer (HCC)    breast   Depression    Diabetes mellitus, type II (HCC)    Family history of anesthesia complication    grandfather died under anesthesia 70's- had heart issues   H/O carpal tunnel repair 2014   Headache(784.0)    Hyperlipidemia    Hypertension    Hypothyroidism    Medial meniscus tear    PONV (postoperative nausea and vomiting)    Sleep apnea    cpap since 10   Past Surgical History:  Procedure Laterality Date   BILATERAL TOTAL MASTECTOMY WITH AXILLARY LYMPH NODE DISSECTION     BREAST RECONSTRUCTION     CERVICAL DISC ARTHROPLASTY N/A 03/19/2013   Procedure: CERVICAL FIVE TO SIX, CERVICAL SIX TO SEVEN CERVICAL ANTERIOR DISC ARTHROPLASTY;  Surgeon: Barnett Abu, MD;  Location: [facility];  Service: Neurosurgery;  Laterality: N/A;  C56 C67 artificial disc replacement   CESAREAN SECTION     COLONOSCOPY     DILATATION & CURETTAGE/HYSTEROSCOPY WITH TRUECLEAR N/A 11/25/2013   Procedure: DILATATION & CURETTAGE/HYSTEROSCOPY WITH TRUCLEAR;  Surgeon: Meriel Pica, MD;  Location: [facility];  Service: Gynecology;  Laterality: N/A;   ECTOPIC PREGNANCY SURGERY     GALLBLADDER SURGERY     MOUTH SURGERY     child- dog bite   SPINAL FUSION     2023   STERIOD INJECTION Left 09/09/2022   Procedure: LEFT KNEE STEROID  INJECTION;  Surgeon: Ollen Gross, MD;  Location: [facility];  Service: Orthopedics;  Laterality: Left;  left knee injection 0928   TONSILLECTOMY     TOTAL KNEE ARTHROPLASTY Right 09/09/2022   Procedure: TOTAL KNEE ARTHROPLASTY;  Surgeon: Ollen Gross, MD;  Location: [facility];  Service: Orthopedics;  Laterality: Right;   TUBAL LIGATION     Patient Active Problem List   Diagnosis Date Noted   OA (osteoarthritis) of knee 09/09/2022   Exertional dyspnea 04/17/2022   Elevated coronary artery calcium score 04/17/2022   Spondylolisthesis at L4-L5 level 12/27/2021   Ductal carcinoma in situ (DCIS) of left breast 01/19/2018   Generalized anxiety disorder 11/07/2017   Depression 11/07/2017   Insomnia 11/07/2017   Cervical spondylosis 03/19/2013    PCP: Creola Corn  REFERRING PROVIDER: Trudee Grip  REFERRING DIAG: R TKA 09/09/22  THERAPY DIAG:  S/P total knee arthroplasty, right  Acute pain of right knee  Stiffness of right knee, not elsewhere classified  Muscle weakness (generalized)  Localized edema  Rationale for Evaluation and Treatment: Rehabilitation  ONSET DATE: 09/09/22  SUBJECTIVE:   SUBJECTIVE STATEMENT :  I am super sore and tight, feels very stiff  PERTINENT HISTORY: R TKA  8/19 L knee steroid injection 09/09/22  PAIN:  Are you having pain? Yes: NPRS scale: 3.5/10 Pain location: R knee Pain description: sharp medial R knee  Aggravating factors: weight bearing on the RLE, bending, moving it  Relieving factors: ice helps some, pain meds  PRECAUTIONS: None  RED FLAGS: None   WEIGHT BEARING RESTRICTIONS: No  FALLS:  Has patient fallen in last 6 months? No  LIVING ENVIRONMENT: Lives with: lives with their family and lives with their spouse Lives in: House/apartment Stairs: Yes: External: 3 steps; none Has following equipment at home: Single point cane and Walker - 2 wheeled  OCCUPATION: Not working  PLOF: Independent  PATIENT GOALS: No pain, to keep  the knee moving  NEXT MD VISIT: 09/24/22  OBJECTIVE:   PATIENT SURVEYS:  FOTO 4  COGNITION: Overall cognitive status: Within functional limits for tasks assessed      EDEMA:  Increased swelling surrounding R knee  POSTURE: rounded shoulders and flexed trunk   PALPATION: Warm and TTP  LOWER EXTREMITY ROM:  Active ROM Right eval Left eval Right 09/24/22 Right 09/26/22 Right 10/02/22  11/01/22  Hip flexion         Hip extension         Hip abduction         Hip adduction         Hip internal rotation         Hip external rotation         Knee flexion  60  75* AROM, 86 PROM 82* AROM seated per pt request  78* self AAROM, 76* AROM  PROM 87 AROM 81 PROM 90 AROM 83  Knee extension -30  -15* AROM seated, -15 PROM supine   -16* supine heel prop with quad set  AROM 25 PROM 16 AROM 14  Ankle dorsiflexion         Ankle plantarflexion         Ankle inversion         Ankle eversion          (Blank rows = not tested)  LOWER EXTREMITY MMT:  MMT Right eval Left eval  Hip flexion 2   Hip extension    Hip abduction    Hip adduction    Hip internal rotation    Hip external rotation    Knee flexion 2   Knee extension 2   Ankle dorsiflexion    Ankle plantarflexion    Ankle inversion    Ankle eversion     (Blank rows = not tested)   FUNCTIONAL TESTS:  5 times sit to stand: unable to do  Timed up and go (TUG): 1 min 28s  GAIT: Distance walked: in clinic distances Assistive device utilized: Environmental consultant - 2 wheeled Level of assistance: SBA Comments: antalgic gait, slowed cadence, step to pattern, decreased step length and time on RLE    TODAY'S TREATMENT:  DATE:  11/25/22 Nustep level 4 x 6 minutes Leg extension 10# working on the flexion at the bottom Leg press 20# working on the flexion down to position #3 and 1 finger width from toe to top of foot  plate Passive stretch of the knee working on flexion  11/22/22 Bike partial revs x 6 minutes Resisted gait 20# 4 way x4 PROM contract/relax and stretching  Anterior and lateral step downs 4"  Leg press 40# 2x10 seat 4  Leg press 20# RLE only at seat 6 x10, then seat 5 x10  11/20/22 Bike partial revs x 5 minutes Leg press 20# working at position #4 and trying to get toes down to 3-4 finger widths from the top of the foot plate Butt touches on tmill 6" step and airex Tmill push 4 x 20 seconds Passive stretch, contract relax Got her to 100 degrees flexion passively  11/18/22 Bike partial revs x 5 minutes 20# leg curls 5# leg extension with each working on end ranges and some holding to get more flexion Leg press 20#  with her having a goal to get to the bottom and have pates touch, position # 4 with 3 fingers from top was where we were able to get to today Gait with sPC, outside negotiating curb, trying to go down with the good leg, a lot of help at first but then able to relax Stairs 4" and 6" up and down working on step over step, needs min A to get to the down with the good leg first Passive stretch focus on flexion  11/15/22 NuStep L5x95mins  Bike partial ranges  Stair training in other building  PROM flexion and extension  5# leg ext RLE 2x10 20# HS curl 2x10 Calf stretch 30s x2 Walking without AD then stepping over obstacles trying to normalize pattern without stopping or thinking about which foot to use   11/13/22 Nustep level 5 x 6 minutes 20# HS curls, some passive extension stretch b/n sets 5# leg extension with passive stretch into flexion and having her try to get back to where the weight touches every rep Sit to stand with 8" step on tmill and the airex, trying to avoid hip compensation 20# leg press with position #5 3 and 4 finger widths toes from the top All gait in the clinic without device, much improved natural bend, did some backward walking, then  walked outside from back door around to the front door PROM different positions  11/11/22 Nustep level 5 x 6 minutes 20# HS curls 4" toe clears 4" lunge stretch 8" step and airex on tmill squatting down to this working on decreased hip hike Passive stretch into flexion, contract relax, STM to the right quad and scar  11/08/22 Passive ROM in multiple positions and with having a goal of touching her heel to the bar under the bed with out raising hip NuStep L5 LE only  4" step overs  Lateral heel taps 4"  Leg ext 10# x10 both legs, RLE x10  HS curls 20# 2x10 Squats with 8" and airex on top of treadmill Calf stretch on slant 15s x3   11/06/22 20# HS curls both legs 10# leg extension but really working on her taking the leg back enough for the weight to touch with the setting on the second notch Leg press 20# working on coming down lower and again having her be able to see and or hear the end  Feet on ball [facility] with a little  PT overpressure for knee flexion, bridges and isometric abs Passive ROM in multiple positions and with having a goal of touching her heel to the bar under the bed with out raising hip  11/04/22 Passive stretch in sitting and in supine with leg off the table, scar and joint mobilizations, oscillations. Contract relax, use of the gun on the thigh.  Able to get her to 95 degrees passively  11/01/22 Progress note 20th  NuStep LE only x5 mins  Bike partial revs 3 mins  PROM to R knee  Squats to low box in bars, then to lower stool  3 big laps in gym with cane   10/30/22 NuStep L5x63mins  Step over 4" in bars  Lunges on steps 4" x10 Lateral and anterior step down 4" x10 Leg press 2x10 20#, x10 work on ROM seat 5  PROM to R knee flex/ext  MET pushing into extension and then moving knee into flexion x5 Pushing foot back as far as she can on low mat table and doing STS x8   PATIENT EDUCATION:  Education details: POC and HEP Person educated: Patient Education  method: Explanation Education comprehension: verbalized understanding  HOME EXERCISE PROGRAM: Access Code: [id number]  ASSESSMENT:  CLINICAL IMPRESSION:  Patient comes in and says she is stiff and not moving, concerned about scar tissue setting in over the weekend.  I assured her that she is still good, did some big pushes.  I still feel like I get a springy end feel.  She does scream out but it is short lived and does not seem to carry over with the pain, she is getting to certain positions easier as we are setting target, the issue at times is when she sees the goni she tenses up  OBJECTIVE IMPAIRMENTS: Abnormal gait, difficulty walking, decreased ROM, decreased strength, and pain.   ACTIVITY LIMITATIONS: bending, sitting, standing, squatting, sleeping, stairs, transfers, bathing, toileting, and locomotion level  PARTICIPATION LIMITATIONS: meal prep, cleaning, laundry, driving, shopping, and community activity  REHAB POTENTIAL: Good  CLINICAL DECISION MAKING: Stable/uncomplicated  EVALUATION COMPLEXITY: Low   GOALS: Goals reviewed with patient? Yes  SHORT TERM GOALS: Target date: 10/24/22  Patient will be independent with initial HEP. Goal status: 09/20/22 met  2.  Patient will be do TUG < 30s Baseline: 1 min 28s  Goal status: ongoing 10/07/22, 18.57s MET 10/09/22  3.  Patient will be able to do 5 sit to stands  Baseline: unable to do  Goal status: progressing 10/04/22, MET 10/09/22   LONG TERM GOALS: Target date: 12/05/22  Patient will be independent with advanced/ongoing HEP to improve outcomes and carryover.  Goal status: ongoing 11/06/22  2.  Patient will report at least 75% improvement in R knee pain to improve QOL. Baseline: 10/10 Goal status: IN PROGRESS  60% better 11/20/22  3.  Patient will demonstrate improved R knee AROM to >/= 0-120 deg to allow for normal gait and stair mechanics. Baseline: 30-0-60 10/09/22: 16-0-78  11/01/22: 14-0-83 11/20/22: 10-97 Goal  status: IN PROGRESS  4.  Patient will demonstrate improved functional LE strength as demonstrated by 5/5. Baseline: 2/5 Goal status: MET 3+/5 10/09/22, 5/5 11/01/22   5.  Patient will be able to ambulate 500' with LRAD and normal gait pattern without increased pain to access community.  Baseline: using RW small in home ambulation distances Goal status: progressing 11/13/22  6.  Patient will report 53 on FOTO (patient reported outcome measure) to demonstrate improved functional ability. Baseline: 4, 40  Goal status: IN PROGRESS 11/01/22  PLAN:  PT FREQUENCY: 2x/week  PT DURATION: 12 weeks  PLANNED INTERVENTIONS: Therapeutic exercises, Therapeutic activity, Neuromuscular re-education, Balance training, Gait training, Patient/Family education, Self Care, Joint mobilization, Stair training, Electrical stimulation, Cryotherapy, Moist heat, scar mobilization, Vasopneumatic device, and Manual therapy  PLAN FOR NEXT SESSION: continue to work on her flexion ROM and gait pattern compensations   Patient Details  Name: PHAEDRA COLGATE MRN: [id number] Date of Birth: 04/14/1960 Referring Provider:  Ollen Gross, MD  Encounter Date: 11/25/2022   Jearld Lesch, PT 11/25/2022, 5:07 PM

## 2022-11-27 ENCOUNTER — Ambulatory Visit: Payer: No Typology Code available for payment source | Admitting: Physical Therapy

## 2022-11-27 ENCOUNTER — Encounter: Payer: Self-pay | Admitting: Physical Therapy

## 2022-11-27 DIAGNOSIS — R2689 Other abnormalities of gait and mobility: Secondary | ICD-10-CM

## 2022-11-27 DIAGNOSIS — M6281 Muscle weakness (generalized): Secondary | ICD-10-CM

## 2022-11-27 DIAGNOSIS — M25561 Pain in right knee: Secondary | ICD-10-CM

## 2022-11-27 DIAGNOSIS — Z96651 Presence of right artificial knee joint: Secondary | ICD-10-CM | POA: Diagnosis not present

## 2022-11-27 DIAGNOSIS — M25661 Stiffness of right knee, not elsewhere classified: Secondary | ICD-10-CM

## 2022-11-27 DIAGNOSIS — R600 Localized edema: Secondary | ICD-10-CM

## 2022-11-27 NOTE — Therapy (Unsigned)
OUTPATIENT PHYSICAL THERAPY LOWER EXTREMITY TREATMENT    Patient Name: [name] MRN: [id number] DOB:Jun 15, 1960, 62 y.o., female Today's Date: 11/27/2022  END OF SESSION:  PT End of Session - 11/27/22 1702     Visit Number 31    Date for PT Re-Evaluation 12/05/22    Authorization Type [employer]    PT Start Time 1700    PT Stop Time 1745    PT Time Calculation (min) 45 min    Activity Tolerance Patient limited by pain;Patient tolerated treatment well    Behavior During Therapy [facility] for tasks assessed/performed                          Past Medical History:  Diagnosis Date   Arthritis    Back pain of lumbar region with sciatica    Cancer (HCC)    breast   Depression    Diabetes mellitus, type II (HCC)    Family history of anesthesia complication    grandfather died under anesthesia 70's- had heart issues   H/O carpal tunnel repair 2014   Headache(784.0)    Hyperlipidemia    Hypertension    Hypothyroidism    Medial meniscus tear    PONV (postoperative nausea and vomiting)    Sleep apnea    cpap since 10   Past Surgical History:  Procedure Laterality Date   BILATERAL TOTAL MASTECTOMY WITH AXILLARY LYMPH NODE DISSECTION     BREAST RECONSTRUCTION     CERVICAL DISC ARTHROPLASTY N/A 03/19/2013   Procedure: CERVICAL FIVE TO SIX, CERVICAL SIX TO SEVEN CERVICAL ANTERIOR DISC ARTHROPLASTY;  Surgeon: Barnett Abu, MD;  Location: [facility];  Service: Neurosurgery;  Laterality: N/A;  C56 C67 artificial disc replacement   CESAREAN SECTION     COLONOSCOPY     DILATATION & CURETTAGE/HYSTEROSCOPY WITH TRUECLEAR N/A 11/25/2013   Procedure: DILATATION & CURETTAGE/HYSTEROSCOPY WITH TRUCLEAR;  Surgeon: Meriel Pica, MD;  Location: [facility];  Service: Gynecology;  Laterality: N/A;   ECTOPIC PREGNANCY SURGERY     GALLBLADDER SURGERY     MOUTH SURGERY     child- dog bite   SPINAL FUSION     2023   STERIOD INJECTION Left 09/09/2022   Procedure: LEFT KNEE STEROID  INJECTION;  Surgeon: Ollen Gross, MD;  Location: [facility];  Service: Orthopedics;  Laterality: Left;  left knee injection 0928   TONSILLECTOMY     TOTAL KNEE ARTHROPLASTY Right 09/09/2022   Procedure: TOTAL KNEE ARTHROPLASTY;  Surgeon: Ollen Gross, MD;  Location: [facility];  Service: Orthopedics;  Laterality: Right;   TUBAL LIGATION     Patient Active Problem List   Diagnosis Date Noted   OA (osteoarthritis) of knee 09/09/2022   Exertional dyspnea 04/17/2022   Elevated coronary artery calcium score 04/17/2022   Spondylolisthesis at L4-L5 level 12/27/2021   Ductal carcinoma in situ (DCIS) of left breast 01/19/2018   Generalized anxiety disorder 11/07/2017   Depression 11/07/2017   Insomnia 11/07/2017   Cervical spondylosis 03/19/2013    PCP: Creola Corn  REFERRING PROVIDER: Trudee Grip  REFERRING DIAG: R TKA 09/09/22  THERAPY DIAG:  S/P total knee arthroplasty, right  Acute pain of right knee  Stiffness of right knee, not elsewhere classified  Muscle weakness (generalized)  Localized edema  Other abnormalities of gait and mobility  Rationale for Evaluation and Treatment: Rehabilitation  ONSET DATE: 09/09/22  SUBJECTIVE:   SUBJECTIVE STATEMENT :  I am stiff, I did email the  PA  PERTINENT HISTORY: R TKA 8/19 L knee steroid injection 09/09/22  PAIN:  Are you having pain? Yes: NPRS scale: 3.5/10 Pain location: R knee Pain description: sharp medial R knee  Aggravating factors: weight bearing on the RLE, bending, moving it  Relieving factors: ice helps some, pain meds  PRECAUTIONS: None  RED FLAGS: None   WEIGHT BEARING RESTRICTIONS: No  FALLS:  Has patient fallen in last 6 months? No  LIVING ENVIRONMENT: Lives with: lives with their family and lives with their spouse Lives in: House/apartment Stairs: Yes: External: 3 steps; none Has following equipment at home: Single point cane and Walker - 2 wheeled  OCCUPATION: Not working  PLOF:  Independent  PATIENT GOALS: No pain, to keep the knee moving  NEXT MD VISIT: 09/24/22  OBJECTIVE:   PATIENT SURVEYS:  FOTO 4  COGNITION: Overall cognitive status: Within functional limits for tasks assessed      EDEMA:  Increased swelling surrounding R knee  POSTURE: rounded shoulders and flexed trunk   PALPATION: Warm and TTP  LOWER EXTREMITY ROM:  Active ROM Right eval Left eval Right 09/24/22 Right 09/26/22 Right 10/02/22  11/01/22  Hip flexion         Hip extension         Hip abduction         Hip adduction         Hip internal rotation         Hip external rotation         Knee flexion  60  75* AROM, 86 PROM 82* AROM seated per pt request  78* self AAROM, 76* AROM  PROM 87 AROM 81 PROM 90 AROM 83  Knee extension -30  -15* AROM seated, -15 PROM supine   -16* supine heel prop with quad set  AROM 25 PROM 16 AROM 14  Ankle dorsiflexion         Ankle plantarflexion         Ankle inversion         Ankle eversion          (Blank rows = not tested)  LOWER EXTREMITY MMT:  MMT Right eval Left eval  Hip flexion 2   Hip extension    Hip abduction    Hip adduction    Hip internal rotation    Hip external rotation    Knee flexion 2   Knee extension 2   Ankle dorsiflexion    Ankle plantarflexion    Ankle inversion    Ankle eversion     (Blank rows = not tested)   FUNCTIONAL TESTS:  5 times sit to stand: unable to do  Timed up and go (TUG): 1 min 28s  GAIT: Distance walked: in clinic distances Assistive device utilized: Environmental consultant - 2 wheeled Level of assistance: SBA Comments: antalgic gait, slowed cadence, step to pattern, decreased step length and time on RLE    TODAY'S TREATMENT:  DATE:  11/27/22 Nustep level 5 x 6 minutes Leg extension 10# working on flexion and trying tpo get back to the bottom Leg press Level 3 toes 3 finger  widths from the top of foot plate, 6 reps and holding at the bottom for 5 seconds b/n sets Passive stretch into flexion to 105 degrees passively  11/25/22 Nustep level 4 x 6 minutes Leg extension 10# working on the flexion at the bottom Leg press 20# working on the flexion down to position #3 and 1 finger width from toe to top of foot plate Passive stretch of the knee working on flexion  11/22/22 Bike partial revs x 6 minutes Resisted gait 20# 4 way x4 PROM contract/relax and stretching  Anterior and lateral step downs 4"  Leg press 40# 2x10 seat 4  Leg press 20# RLE only at seat 6 x10, then seat 5 x10  11/20/22 Bike partial revs x 5 minutes Leg press 20# working at position #4 and trying to get toes down to 3-4 finger widths from the top of the foot plate Butt touches on tmill 6" step and airex Tmill push 4 x 20 seconds Passive stretch, contract relax Got her to 100 degrees flexion passively  11/18/22 Bike partial revs x 5 minutes 20# leg curls 5# leg extension with each working on end ranges and some holding to get more flexion Leg press 20#  with her having a goal to get to the bottom and have pates touch, position # 4 with 3 fingers from top was where we were able to get to today Gait with sPC, outside negotiating curb, trying to go down with the good leg, a lot of help at first but then able to relax Stairs 4" and 6" up and down working on step over step, needs min A to get to the down with the good leg first Passive stretch focus on flexion  11/15/22 NuStep L5x55mins  Bike partial ranges  Stair training in other building  PROM flexion and extension  5# leg ext RLE 2x10 20# HS curl 2x10 Calf stretch 30s x2 Walking without AD then stepping over obstacles trying to normalize pattern without stopping or thinking about which foot to use   11/13/22 Nustep level 5 x 6 minutes 20# HS curls, some passive extension stretch b/n sets 5# leg extension with passive stretch  into flexion and having her try to get back to where the weight touches every rep Sit to stand with 8" step on tmill and the airex, trying to avoid hip compensation 20# leg press with position #5 3 and 4 finger widths toes from the top All gait in the clinic without device, much improved natural bend, did some backward walking, then walked outside from back door around to the front door PROM different positions  11/11/22 Nustep level 5 x 6 minutes 20# HS curls 4" toe clears 4" lunge stretch 8" step and airex on tmill squatting down to this working on decreased hip hike Passive stretch into flexion, contract relax, STM to the right quad and scar  11/08/22 Passive ROM in multiple positions and with having a goal of touching her heel to the bar under the bed with out raising hip NuStep L5 LE only  4" step overs  Lateral heel taps 4"  Leg ext 10# x10 both legs, RLE x10  HS curls 20# 2x10 Squats with 8" and airex on top of treadmill Calf stretch on slant 15s x3   11/06/22 20# HS curls  both legs 10# leg extension but really working on her taking the leg back enough for the weight to touch with the setting on the second notch Leg press 20# working on coming down lower and again having her be able to see and or hear the end  Feet on ball K2C with a little PT overpressure for knee flexion, bridges and isometric abs Passive ROM in multiple positions and with having a goal of touching her heel to the bar under the bed with out raising hip  11/04/22 Passive stretch in sitting and in supine with leg off the table, scar and joint mobilizations, oscillations. Contract relax, use of the gun on the thigh.  Able to get her to 95 degrees passively  11/01/22 Progress note 20th  NuStep LE only x5 mins  Bike partial revs 3 mins  PROM to R knee  Squats to low box in bars, then to lower stool  3 big laps in gym with cane   10/30/22 NuStep L5x65mins  Step over 4" in bars  Lunges on steps 4"  x10 Lateral and anterior step down 4" x10 Leg press 2x10 20#, x10 work on ROM seat 5  PROM to R knee flex/ext  MET pushing into extension and then moving knee into flexion x5 Pushing foot back as far as she can on low mat table and doing STS x8   PATIENT EDUCATION:  Education details: POC and HEP Person educated: Patient Education method: Explanation Education comprehension: verbalized understanding  HOME EXERCISE PROGRAM: Access Code: [id number]  ASSESSMENT:  CLINICAL IMPRESSION:  Patient continues to report stiffness, however after a few reps of a certain exercise she will loosen up, I still am feeling that we are getting a good stretch and not hitting a solid block, again feel like the end range has some give and spring to it and I feel that she is tolerating the stretch much better.  OBJECTIVE IMPAIRMENTS: Abnormal gait, difficulty walking, decreased ROM, decreased strength, and pain.   ACTIVITY LIMITATIONS: bending, sitting, standing, squatting, sleeping, stairs, transfers, bathing, toileting, and locomotion level  PARTICIPATION LIMITATIONS: meal prep, cleaning, laundry, driving, shopping, and community activity  REHAB POTENTIAL: Good  CLINICAL DECISION MAKING: Stable/uncomplicated  EVALUATION COMPLEXITY: Low   GOALS: Goals reviewed with patient? Yes  SHORT TERM GOALS: Target date: 10/24/22  Patient will be independent with initial HEP. Goal status: 09/20/22 met  2.  Patient will be do TUG < 30s Baseline: 1 min 28s  Goal status: ongoing 10/07/22, 18.57s MET 10/09/22  3.  Patient will be able to do 5 sit to stands  Baseline: unable to do  Goal status: progressing 10/04/22, MET 10/09/22   LONG TERM GOALS: Target date: 12/05/22  Patient will be independent with advanced/ongoing HEP to improve outcomes and carryover.  Goal status: ongoing 11/06/22  2.  Patient will report at least 75% improvement in R knee pain to improve QOL. Baseline: 10/10 Goal status: IN  PROGRESS  60% better 11/20/22  3.  Patient will demonstrate improved R knee AROM to >/= 0-120 deg to allow for normal gait and stair mechanics. Baseline: 30-0-60 10/09/22: 16-0-78  11/01/22: 14-0-83 11/20/22: 10-97 Goal status: IN PROGRESS  4.  Patient will demonstrate improved functional LE strength as demonstrated by 5/5. Baseline: 2/5 Goal status: MET 3+/5 10/09/22, 5/5 11/01/22   5.  Patient will be able to ambulate 500' with LRAD and normal gait pattern without increased pain to access community.  Baseline: using RW small in home ambulation distances  Goal status: progressing 11/13/22  6.  Patient will report 70 on FOTO (patient reported outcome measure) to demonstrate improved functional ability. Baseline: 4, 40  Goal status: IN PROGRESS 11/01/22  PLAN:  PT FREQUENCY: 2x/week  PT DURATION: 12 weeks  PLANNED INTERVENTIONS: Therapeutic exercises, Therapeutic activity, Neuromuscular re-education, Balance training, Gait training, Patient/Family education, Self Care, Joint mobilization, Stair training, Electrical stimulation, Cryotherapy, Moist heat, scar mobilization, Vasopneumatic device, and Manual therapy  PLAN FOR NEXT SESSION: continue to work on her flexion ROM and gait pattern compensations   Patient Details  Name: MADELINA SANDA MRN: [id number] Date of Birth: April 27, 1960 Referring Provider:  Ollen Gross, MD  Encounter Date: 11/27/2022   Jearld Lesch, PT 11/27/2022, 5:02 PM

## 2022-11-28 NOTE — Therapy (Signed)
OUTPATIENT PHYSICAL THERAPY LOWER EXTREMITY TREATMENT    Patient Name: [name] MRN: [id number] DOB:August 06, 1960, 62 y.o., female Today's Date: 11/29/2022  END OF SESSION:  PT End of Session - 11/29/22 1100     Visit Number 32    Date for PT Re-Evaluation 12/05/22    Authorization Type [employer]    PT Start Time 1100    PT Stop Time 1145    PT Time Calculation (min) 45 min    Activity Tolerance Patient limited by pain;Patient tolerated treatment well    Behavior During Therapy [facility] for tasks assessed/performed                           Past Medical History:  Diagnosis Date   Arthritis    Back pain of lumbar region with sciatica    Cancer (HCC)    breast   Depression    Diabetes mellitus, type II (HCC)    Family history of anesthesia complication    grandfather died under anesthesia 70's- had heart issues   H/O carpal tunnel repair 2014   Headache(784.0)    Hyperlipidemia    Hypertension    Hypothyroidism    Medial meniscus tear    PONV (postoperative nausea and vomiting)    Sleep apnea    cpap since 10   Past Surgical History:  Procedure Laterality Date   BILATERAL TOTAL MASTECTOMY WITH AXILLARY LYMPH NODE DISSECTION     BREAST RECONSTRUCTION     CERVICAL DISC ARTHROPLASTY N/A 03/19/2013   Procedure: CERVICAL FIVE TO SIX, CERVICAL SIX TO SEVEN CERVICAL ANTERIOR DISC ARTHROPLASTY;  Surgeon: Barnett Abu, MD;  Location: [facility];  Service: Neurosurgery;  Laterality: N/A;  C56 C67 artificial disc replacement   CESAREAN SECTION     COLONOSCOPY     DILATATION & CURETTAGE/HYSTEROSCOPY WITH TRUECLEAR N/A 11/25/2013   Procedure: DILATATION & CURETTAGE/HYSTEROSCOPY WITH TRUCLEAR;  Surgeon: Meriel Pica, MD;  Location: [facility];  Service: Gynecology;  Laterality: N/A;   ECTOPIC PREGNANCY SURGERY     GALLBLADDER SURGERY     MOUTH SURGERY     child- dog bite   SPINAL FUSION     2023   STERIOD INJECTION Left 09/09/2022   Procedure: LEFT KNEE STEROID  INJECTION;  Surgeon: Ollen Gross, MD;  Location: [facility];  Service: Orthopedics;  Laterality: Left;  left knee injection 0928   TONSILLECTOMY     TOTAL KNEE ARTHROPLASTY Right 09/09/2022   Procedure: TOTAL KNEE ARTHROPLASTY;  Surgeon: Ollen Gross, MD;  Location: [facility];  Service: Orthopedics;  Laterality: Right;   TUBAL LIGATION     Patient Active Problem List   Diagnosis Date Noted   OA (osteoarthritis) of knee 09/09/2022   Exertional dyspnea 04/17/2022   Elevated coronary artery calcium score 04/17/2022   Spondylolisthesis at L4-L5 level 12/27/2021   Ductal carcinoma in situ (DCIS) of left breast 01/19/2018   Generalized anxiety disorder 11/07/2017   Depression 11/07/2017   Insomnia 11/07/2017   Cervical spondylosis 03/19/2013    PCP: Creola Corn  REFERRING PROVIDER: Trudee Grip  REFERRING DIAG: R TKA 09/09/22  THERAPY DIAG:  S/P total knee arthroplasty, right  Acute pain of right knee  Stiffness of right knee, not elsewhere classified  Muscle weakness (generalized)  Localized edema  Rationale for Evaluation and Treatment: Rehabilitation  ONSET DATE: 09/09/22  SUBJECTIVE:   SUBJECTIVE STATEMENT :  I did not sleep well. It is not as stiff as it has  been in the afternoon.   PERTINENT HISTORY: R TKA 8/19 L knee steroid injection 09/09/22  PAIN:  Are you having pain? Yes: NPRS scale: 4/10 Pain location: R knee Pain description: sharp medial R knee  Aggravating factors: weight bearing on the RLE, bending, moving it  Relieving factors: ice helps some, pain meds  PRECAUTIONS: None  RED FLAGS: None   WEIGHT BEARING RESTRICTIONS: No  FALLS:  Has patient fallen in last 6 months? No  LIVING ENVIRONMENT: Lives with: lives with their family and lives with their spouse Lives in: House/apartment Stairs: Yes: External: 3 steps; none Has following equipment at home: Single point cane and Walker - 2 wheeled  OCCUPATION: Not working  PLOF:  Independent  PATIENT GOALS: No pain, to keep the knee moving  NEXT MD VISIT: 09/24/22  OBJECTIVE:   PATIENT SURVEYS:  FOTO 4  COGNITION: Overall cognitive status: Within functional limits for tasks assessed      EDEMA:  Increased swelling surrounding R knee  POSTURE: rounded shoulders and flexed trunk   PALPATION: Warm and TTP  LOWER EXTREMITY ROM:  Active ROM Right eval Left eval Right 09/24/22 Right 09/26/22 Right 10/02/22  11/01/22  Hip flexion         Hip extension         Hip abduction         Hip adduction         Hip internal rotation         Hip external rotation         Knee flexion  60  75* AROM, 86 PROM 82* AROM seated per pt request  78* self AAROM, 76* AROM  PROM 87 AROM 81 PROM 90 AROM 83  Knee extension -30  -15* AROM seated, -15 PROM supine   -16* supine heel prop with quad set  AROM 25 PROM 16 AROM 14  Ankle dorsiflexion         Ankle plantarflexion         Ankle inversion         Ankle eversion          (Blank rows = not tested)  LOWER EXTREMITY MMT:  MMT Right eval Left eval  Hip flexion 2   Hip extension    Hip abduction    Hip adduction    Hip internal rotation    Hip external rotation    Knee flexion 2   Knee extension 2   Ankle dorsiflexion    Ankle plantarflexion    Ankle inversion    Ankle eversion     (Blank rows = not tested)   FUNCTIONAL TESTS:  5 times sit to stand: unable to do  Timed up and go (TUG): 1 min 28s  GAIT: Distance walked: in clinic distances Assistive device utilized: Environmental consultant - 2 wheeled Level of assistance: SBA Comments: antalgic gait, slowed cadence, step to pattern, decreased step length and time on RLE    TODAY'S TREATMENT:  DATE:  11/29/22 Bike partial revs x83mins  Leg ext 10# trying to hit weight 2x10, x5 eccentric lowering, 5# RLE x5 HS curls 35# x10, 25# x10 Leg press  level 3 2x5 5s holds  Passive stretching into flexion, leg hooked on to table and lowering it until she can hold x3 with 15s holds   11/27/22 Nustep level 5 x 6 minutes Leg extension 10# working on flexion and trying tpo get back to the bottom Leg press Level 3 toes 3 finger widths from the top of foot plate, 6 reps and holding at the bottom for 5 seconds b/n sets Passive stretch into flexion to 105 degrees passively  11/25/22 Nustep level 4 x 6 minutes Leg extension 10# working on the flexion at the bottom Leg press 20# working on the flexion down to position #3 and 1 finger width from toe to top of foot plate Passive stretch of the knee working on flexion  11/22/22 Bike partial revs x 6 minutes Resisted gait 20# 4 way x4 PROM contract/relax and stretching  Anterior and lateral step downs 4"  Leg press 40# 2x10 seat 4  Leg press 20# RLE only at seat 6 x10, then seat 5 x10  11/20/22 Bike partial revs x 5 minutes Leg press 20# working at position #4 and trying to get toes down to 3-4 finger widths from the top of the foot plate Butt touches on tmill 6" step and airex Tmill push 4 x 20 seconds Passive stretch, contract relax Got her to 100 degrees flexion passively  11/18/22 Bike partial revs x 5 minutes 20# leg curls 5# leg extension with each working on end ranges and some holding to get more flexion Leg press 20#  with her having a goal to get to the bottom and have pates touch, position # 4 with 3 fingers from top was where we were able to get to today Gait with sPC, outside negotiating curb, trying to go down with the good leg, a lot of help at first but then able to relax Stairs 4" and 6" up and down working on step over step, needs min A to get to the down with the good leg first Passive stretch focus on flexion  11/15/22 NuStep L5x56mins  Bike partial ranges  Stair training in other building  PROM flexion and extension  5# leg ext RLE 2x10 20# HS curl 2x10 Calf  stretch 30s x2 Walking without AD then stepping over obstacles trying to normalize pattern without stopping or thinking about which foot to use   11/13/22 Nustep level 5 x 6 minutes 20# HS curls, some passive extension stretch b/n sets 5# leg extension with passive stretch into flexion and having her try to get back to where the weight touches every rep Sit to stand with 8" step on tmill and the airex, trying to avoid hip compensation 20# leg press with position #5 3 and 4 finger widths toes from the top All gait in the clinic without device, much improved natural bend, did some backward walking, then walked outside from back door around to the front door PROM different positions  11/11/22 Nustep level 5 x 6 minutes 20# HS curls 4" toe clears 4" lunge stretch 8" step and airex on tmill squatting down to this working on decreased hip hike Passive stretch into flexion, contract relax, STM to the right quad and scar  11/08/22 Passive ROM in multiple positions and with having a goal of touching her heel to the  bar under the bed with out raising hip NuStep L5 LE only  4" step overs  Lateral heel taps 4"  Leg ext 10# x10 both legs, RLE x10  HS curls 20# 2x10 Squats with 8" and airex on top of treadmill Calf stretch on slant 15s x3   11/06/22 20# HS curls both legs 10# leg extension but really working on her taking the leg back enough for the weight to touch with the setting on the second notch Leg press 20# working on coming down lower and again having her be able to see and or hear the end  Feet on ball K2C with a little PT overpressure for knee flexion, bridges and isometric abs Passive ROM in multiple positions and with having a goal of touching her heel to the bar under the bed with out raising hip  11/04/22 Passive stretch in sitting and in supine with leg off the table, scar and joint mobilizations, oscillations. Contract relax, use of the gun on the thigh.  Able to get her to  95 degrees passively  11/01/22 Progress note 20th  NuStep LE only x5 mins  Bike partial revs 3 mins  PROM to R knee  Squats to low box in bars, then to lower stool  3 big laps in gym with cane   10/30/22 NuStep L5x17mins  Step over 4" in bars  Lunges on steps 4" x10 Lateral and anterior step down 4" x10 Leg press 2x10 20#, x10 work on ROM seat 5  PROM to R knee flex/ext  MET pushing into extension and then moving knee into flexion x5 Pushing foot back as far as she can on low mat table and doing STS x8   PATIENT EDUCATION:  Education details: POC and HEP Person educated: Patient Education method: Explanation Education comprehension: verbalized understanding  HOME EXERCISE PROGRAM: Access Code: [id number]  ASSESSMENT:  CLINICAL IMPRESSION:  Patient continues to report less stiffness in the mornings. Continues to work on getting the knee to flex as much as possible. We tried a new way to stretch by having her hook her leg and lower the table until she could hold. Reports she can feel the big stretch. She is tolerating PROM better and able to get over 100 degrees. Actively was able to get 95d today.   OBJECTIVE IMPAIRMENTS: Abnormal gait, difficulty walking, decreased ROM, decreased strength, and pain.   ACTIVITY LIMITATIONS: bending, sitting, standing, squatting, sleeping, stairs, transfers, bathing, toileting, and locomotion level  PARTICIPATION LIMITATIONS: meal prep, cleaning, laundry, driving, shopping, and community activity  REHAB POTENTIAL: Good  CLINICAL DECISION MAKING: Stable/uncomplicated  EVALUATION COMPLEXITY: Low   GOALS: Goals reviewed with patient? Yes  SHORT TERM GOALS: Target date: 10/24/22  Patient will be independent with initial HEP. Goal status: 09/20/22 met  2.  Patient will be do TUG < 30s Baseline: 1 min 28s  Goal status: ongoing 10/07/22, 18.57s MET 10/09/22  3.  Patient will be able to do 5 sit to stands  Baseline: unable to do  Goal  status: progressing 10/04/22, MET 10/09/22   LONG TERM GOALS: Target date: 12/05/22  Patient will be independent with advanced/ongoing HEP to improve outcomes and carryover.  Goal status: ongoing 11/06/22  2.  Patient will report at least 75% improvement in R knee pain to improve QOL. Baseline: 10/10 Goal status: IN PROGRESS  60% better 11/20/22  3.  Patient will demonstrate improved R knee AROM to >/= 0-120 deg to allow for normal gait and stair mechanics. Baseline:  30-0-60 10/09/22: 16-0-78  11/01/22: 14-0-83 11/20/22: 10-97 Goal status: IN PROGRESS  4.  Patient will demonstrate improved functional LE strength as demonstrated by 5/5. Baseline: 2/5 Goal status: MET 3+/5 10/09/22, 5/5 11/01/22   5.  Patient will be able to ambulate 500' with LRAD and normal gait pattern without increased pain to access community.  Baseline: using RW small in home ambulation distances Goal status: progressing 11/13/22  6.  Patient will report 51 on FOTO (patient reported outcome measure) to demonstrate improved functional ability. Baseline: 4, 40  Goal status: IN PROGRESS 11/01/22  PLAN:  PT FREQUENCY: 2x/week  PT DURATION: 12 weeks  PLANNED INTERVENTIONS: Therapeutic exercises, Therapeutic activity, Neuromuscular re-education, Balance training, Gait training, Patient/Family education, Self Care, Joint mobilization, Stair training, Electrical stimulation, Cryotherapy, Moist heat, scar mobilization, Vasopneumatic device, and Manual therapy  PLAN FOR NEXT SESSION: continue to work on her flexion ROM and gait pattern compensations   Patient Details  Name: [name] MRN: [id number] Date of Birth: January 12, 1961 Referring Provider:  Ollen Gross, MD  Encounter Date: 11/29/2022   Cassie Freer, PT 11/29/2022, 11:48 AM

## 2022-11-29 ENCOUNTER — Ambulatory Visit: Payer: No Typology Code available for payment source

## 2022-11-29 DIAGNOSIS — M25661 Stiffness of right knee, not elsewhere classified: Secondary | ICD-10-CM

## 2022-11-29 DIAGNOSIS — M25561 Pain in right knee: Secondary | ICD-10-CM

## 2022-11-29 DIAGNOSIS — R600 Localized edema: Secondary | ICD-10-CM

## 2022-11-29 DIAGNOSIS — M6281 Muscle weakness (generalized): Secondary | ICD-10-CM

## 2022-11-29 DIAGNOSIS — Z96651 Presence of right artificial knee joint: Secondary | ICD-10-CM | POA: Diagnosis not present

## 2022-12-02 ENCOUNTER — Ambulatory Visit: Payer: No Typology Code available for payment source | Admitting: Physical Therapy

## 2022-12-02 ENCOUNTER — Encounter: Payer: Self-pay | Admitting: Physical Therapy

## 2022-12-02 DIAGNOSIS — Z96651 Presence of right artificial knee joint: Secondary | ICD-10-CM | POA: Diagnosis not present

## 2022-12-02 DIAGNOSIS — M25561 Pain in right knee: Secondary | ICD-10-CM

## 2022-12-02 DIAGNOSIS — R600 Localized edema: Secondary | ICD-10-CM

## 2022-12-02 DIAGNOSIS — M25661 Stiffness of right knee, not elsewhere classified: Secondary | ICD-10-CM

## 2022-12-02 DIAGNOSIS — M6281 Muscle weakness (generalized): Secondary | ICD-10-CM

## 2022-12-02 NOTE — Therapy (Signed)
OUTPATIENT PHYSICAL THERAPY LOWER EXTREMITY TREATMENT    Patient Name: [name] MRN: [id number] DOB:1960/03/01, 62 y.o., female Today's Date: 12/02/2022  END OF SESSION:  PT End of Session - 12/02/22 1710     Visit Number 33    Date for PT Re-Evaluation 12/05/22    Authorization Type [employer]    PT Start Time 1700    PT Stop Time 1749    PT Time Calculation (min) 49 min    Activity Tolerance Patient limited by pain;Patient tolerated treatment well    Behavior During Therapy [facility] for tasks assessed/performed                           Past Medical History:  Diagnosis Date   Arthritis    Back pain of lumbar region with sciatica    Cancer (HCC)    breast   Depression    Diabetes mellitus, type II (HCC)    Family history of anesthesia complication    grandfather died under anesthesia 70's- had heart issues   H/O carpal tunnel repair 2014   Headache(784.0)    Hyperlipidemia    Hypertension    Hypothyroidism    Medial meniscus tear    PONV (postoperative nausea and vomiting)    Sleep apnea    cpap since 10   Past Surgical History:  Procedure Laterality Date   BILATERAL TOTAL MASTECTOMY WITH AXILLARY LYMPH NODE DISSECTION     BREAST RECONSTRUCTION     CERVICAL DISC ARTHROPLASTY N/A 03/19/2013   Procedure: CERVICAL FIVE TO SIX, CERVICAL SIX TO SEVEN CERVICAL ANTERIOR DISC ARTHROPLASTY;  Surgeon: Barnett Abu, MD;  Location: [facility];  Service: Neurosurgery;  Laterality: N/A;  C56 C67 artificial disc replacement   CESAREAN SECTION     COLONOSCOPY     DILATATION & CURETTAGE/HYSTEROSCOPY WITH TRUECLEAR N/A 11/25/2013   Procedure: DILATATION & CURETTAGE/HYSTEROSCOPY WITH TRUCLEAR;  Surgeon: Meriel Pica, MD;  Location: [facility];  Service: Gynecology;  Laterality: N/A;   ECTOPIC PREGNANCY SURGERY     GALLBLADDER SURGERY     MOUTH SURGERY     child- dog bite   SPINAL FUSION     2023   STERIOD INJECTION Left 09/09/2022   Procedure: LEFT KNEE  STEROID INJECTION;  Surgeon: Ollen Gross, MD;  Location: [facility];  Service: Orthopedics;  Laterality: Left;  left knee injection 0928   TONSILLECTOMY     TOTAL KNEE ARTHROPLASTY Right 09/09/2022   Procedure: TOTAL KNEE ARTHROPLASTY;  Surgeon: Ollen Gross, MD;  Location: [facility];  Service: Orthopedics;  Laterality: Right;   TUBAL LIGATION     Patient Active Problem List   Diagnosis Date Noted   OA (osteoarthritis) of knee 09/09/2022   Exertional dyspnea 04/17/2022   Elevated coronary artery calcium score 04/17/2022   Spondylolisthesis at L4-L5 level 12/27/2021   Ductal carcinoma in situ (DCIS) of left breast 01/19/2018   Generalized anxiety disorder 11/07/2017   Depression 11/07/2017   Insomnia 11/07/2017   Cervical spondylosis 03/19/2013    PCP: Creola Corn  REFERRING PROVIDER: Trudee Grip  REFERRING DIAG: R TKA 09/09/22  THERAPY DIAG:  S/P total knee arthroplasty, right  Acute pain of right knee  Stiffness of right knee, not elsewhere classified  Muscle weakness (generalized)  Localized edema  Rationale for Evaluation and Treatment: Rehabilitation  ONSET DATE: 09/09/22  SUBJECTIVE:   SUBJECTIVE STATEMENT :  I am doing well, no issues, don't feel as stiff  PERTINENT HISTORY:  R TKA 8/19 L knee steroid injection 09/09/22  PAIN:  Are you having pain? Yes: NPRS scale: 4/10 Pain location: R knee Pain description: sharp medial R knee  Aggravating factors: weight bearing on the RLE, bending, moving it  Relieving factors: ice helps some, pain meds  PRECAUTIONS: None  RED FLAGS: None   WEIGHT BEARING RESTRICTIONS: No  FALLS:  Has patient fallen in last 6 months? No  LIVING ENVIRONMENT: Lives with: lives with their family and lives with their spouse Lives in: House/apartment Stairs: Yes: External: 3 steps; none Has following equipment at home: Single point cane and Walker - 2 wheeled  OCCUPATION: Not working  PLOF: Independent  PATIENT GOALS: No pain,  to keep the knee moving  NEXT MD VISIT: 09/24/22  OBJECTIVE:   PATIENT SURVEYS:  FOTO 4  COGNITION: Overall cognitive status: Within functional limits for tasks assessed      EDEMA:  Increased swelling surrounding R knee  POSTURE: rounded shoulders and flexed trunk   PALPATION: Warm and TTP  LOWER EXTREMITY ROM:  Active ROM Right eval Left eval Right 09/24/22 Right 09/26/22 Right 10/02/22  11/01/22  Hip flexion         Hip extension         Hip abduction         Hip adduction         Hip internal rotation         Hip external rotation         Knee flexion  60  75* AROM, 86 PROM 82* AROM seated per pt request  78* self AAROM, 76* AROM  PROM 87 AROM 81 PROM 90 AROM 83  Knee extension -30  -15* AROM seated, -15 PROM supine   -16* supine heel prop with quad set  AROM 25 PROM 16 AROM 14  Ankle dorsiflexion         Ankle plantarflexion         Ankle inversion         Ankle eversion          (Blank rows = not tested)  LOWER EXTREMITY MMT:  MMT Right eval Left eval  Hip flexion 2   Hip extension    Hip abduction    Hip adduction    Hip internal rotation    Hip external rotation    Knee flexion 2   Knee extension 2   Ankle dorsiflexion    Ankle plantarflexion    Ankle inversion    Ankle eversion     (Blank rows = not tested)   FUNCTIONAL TESTS:  5 times sit to stand: unable to do  Timed up and go (TUG): 1 min 28s  GAIT: Distance walked: in clinic distances Assistive device utilized: Environmental consultant - 2 wheeled Level of assistance: SBA Comments: antalgic gait, slowed cadence, step to pattern, decreased step length and time on RLE    TODAY'S TREATMENT:  DATE:  12/02/22 Nustep level 5 x 6 minutes Walking outside with cane curbs LEg extension 10# with passive stretch b/n sets Leg curls 25# Fast walking forward and then backward walking Leg  press 30# working on getting down to position #3 with 3 finger widths toe from the top of the foot plate Stairs step over step 4" and 6" Passive stretch to 102 degrees without much difficulty  11/29/22 Bike partial revs x69mins  Leg ext 10# trying to hit weight 2x10, x5 eccentric lowering, 5# RLE x5 HS curls 35# x10, 25# x10 Leg press level 3 2x5 5s holds  Passive stretching into flexion, leg hooked on to table and lowering it until she can hold x3 with 15s holds   11/27/22 Nustep level 5 x 6 minutes Leg extension 10# working on flexion and trying tpo get back to the bottom Leg press Level 3 toes 3 finger widths from the top of foot plate, 6 reps and holding at the bottom for 5 seconds b/n sets Passive stretch into flexion to 105 degrees passively  11/25/22 Nustep level 4 x 6 minutes Leg extension 10# working on the flexion at the bottom Leg press 20# working on the flexion down to position #3 and 1 finger width from toe to top of foot plate Passive stretch of the knee working on flexion  11/22/22 Bike partial revs x 6 minutes Resisted gait 20# 4 way x4 PROM contract/relax and stretching  Anterior and lateral step downs 4"  Leg press 40# 2x10 seat 4  Leg press 20# RLE only at seat 6 x10, then seat 5 x10  11/20/22 Bike partial revs x 5 minutes Leg press 20# working at position #4 and trying to get toes down to 3-4 finger widths from the top of the foot plate Butt touches on tmill 6" step and airex Tmill push 4 x 20 seconds Passive stretch, contract relax Got her to 100 degrees flexion passively  11/18/22 Bike partial revs x 5 minutes 20# leg curls 5# leg extension with each working on end ranges and some holding to get more flexion Leg press 20#  with her having a goal to get to the bottom and have pates touch, position # 4 with 3 fingers from top was where we were able to get to today Gait with sPC, outside negotiating curb, trying to go down with the good leg, a lot of help at  first but then able to relax Stairs 4" and 6" up and down working on step over step, needs min A to get to the down with the good leg first Passive stretch focus on flexion  11/15/22 NuStep L5x47mins  Bike partial ranges  Stair training in other building  PROM flexion and extension  5# leg ext RLE 2x10 20# HS curl 2x10 Calf stretch 30s x2 Walking without AD then stepping over obstacles trying to normalize pattern without stopping or thinking about which foot to use   11/13/22 Nustep level 5 x 6 minutes 20# HS curls, some passive extension stretch b/n sets 5# leg extension with passive stretch into flexion and having her try to get back to where the weight touches every rep Sit to stand with 8" step on tmill and the airex, trying to avoid hip compensation 20# leg press with position #5 3 and 4 finger widths toes from the top All gait in the clinic without device, much improved natural bend, did some backward walking, then walked outside from back door around to the  front door PROM different positions  11/11/22 Nustep level 5 x 6 minutes 20# HS curls 4" toe clears 4" lunge stretch 8" step and airex on tmill squatting down to this working on decreased hip hike Passive stretch into flexion, contract relax, STM to the right quad and scar  11/08/22 Passive ROM in multiple positions and with having a goal of touching her heel to the bar under the bed with out raising hip NuStep L5 LE only  4" step overs  Lateral heel taps 4"  Leg ext 10# x10 both legs, RLE x10  HS curls 20# 2x10 Squats with 8" and airex on top of treadmill Calf stretch on slant 15s x3   11/06/22 20# HS curls both legs 10# leg extension but really working on her taking the leg back enough for the weight to touch with the setting on the second notch Leg press 20# working on coming down lower and again having her be able to see and or hear the end  Feet on ball K2C with a little PT overpressure for knee  flexion, bridges and isometric abs Passive ROM in multiple positions and with having a goal of touching her heel to the bar under the bed with out raising hip  11/04/22 Passive stretch in sitting and in supine with leg off the table, scar and joint mobilizations, oscillations. Contract relax, use of the gun on the thigh.  Able to get her to 95 degrees passively  11/01/22 Progress note 20th  NuStep LE only x5 mins  Bike partial revs 3 mins  PROM to R knee  Squats to low box in bars, then to lower stool  3 big laps in gym with cane   10/30/22 NuStep L5x6mins  Step over 4" in bars  Lunges on steps 4" x10 Lateral and anterior step down 4" x10 Leg press 2x10 20#, x10 work on ROM seat 5  PROM to R knee flex/ext  MET pushing into extension and then moving knee into flexion x5 Pushing foot back as far as she can on low mat table and doing STS x8   PATIENT EDUCATION:  Education details: POC and HEP Person educated: Patient Education method: Explanation Education comprehension: verbalized understanding  HOME EXERCISE PROGRAM: Access Code: [id number]  ASSESSMENT:  CLINICAL IMPRESSION:  Patient is starting to move a little better we did some fast walking and backward walking and had little hesitation and minimal deviation, worked on curbs and stairs step over step, 6" still and issue going down leading with the left leg.  We continue to push the ROM and where we are at seems to be getting easier and less painful, still slowly progressing with the degrees of flexion OBJECTIVE IMPAIRMENTS: Abnormal gait, difficulty walking, decreased ROM, decreased strength, and pain.   ACTIVITY LIMITATIONS: bending, sitting, standing, squatting, sleeping, stairs, transfers, bathing, toileting, and locomotion level  PARTICIPATION LIMITATIONS: meal prep, cleaning, laundry, driving, shopping, and community activity  REHAB POTENTIAL: Good  CLINICAL DECISION MAKING: Stable/uncomplicated  EVALUATION  COMPLEXITY: Low   GOALS: Goals reviewed with patient? Yes  SHORT TERM GOALS: Target date: 10/24/22  Patient will be independent with initial HEP. Goal status: 09/20/22 met  2.  Patient will be do TUG < 30s Baseline: 1 min 28s  Goal status: ongoing 10/07/22, 18.57s MET 10/09/22  3.  Patient will be able to do 5 sit to stands  Baseline: unable to do  Goal status: progressing 10/04/22, MET 10/09/22   LONG TERM GOALS: Target date: 12/05/22  Patient will be independent with advanced/ongoing HEP to improve outcomes and carryover.  Goal status: ongoing 11/06/22  2.  Patient will report at least 75% improvement in R knee pain to improve QOL. Baseline: 10/10 Goal status: IN PROGRESS  60% better 11/20/22  3.  Patient will demonstrate improved R knee AROM to >/= 0-120 deg to allow for normal gait and stair mechanics. Baseline: 30-0-60 10/09/22: 16-0-78  11/01/22: 14-0-83 11/20/22: 10-97 Goal status: IN PROGRESS  4.  Patient will demonstrate improved functional LE strength as demonstrated by 5/5. Baseline: 2/5 Goal status: MET 3+/5 10/09/22, 5/5 11/01/22   5.  Patient will be able to ambulate 500' with LRAD and normal gait pattern without increased pain to access community.  Baseline: using RW small in home ambulation distances Goal status: progressing 12/02/22  6.  Patient will report 91 on FOTO (patient reported outcome measure) to demonstrate improved functional ability. Baseline: 4, 40  Goal status: IN PROGRESS 12/02/22  PLAN:  PT FREQUENCY: 2x/week  PT DURATION: 12 weeks  PLANNED INTERVENTIONS: Therapeutic exercises, Therapeutic activity, Neuromuscular re-education, Balance training, Gait training, Patient/Family education, Self Care, Joint mobilization, Stair training, Electrical stimulation, Cryotherapy, Moist heat, scar mobilization, Vasopneumatic device, and Manual therapy  PLAN FOR NEXT SESSION: continue to work on her flexion ROM and gait pattern  compensations   Patient Details  Name: [name] MRN: [id number] Date of Birth: 1960-05-06 Referring Provider:  Ollen Gross, MD  Encounter Date: 12/02/2022   Jearld Lesch, PT 12/02/2022, 5:10 PM

## 2022-12-03 ENCOUNTER — Encounter: Payer: Self-pay | Admitting: Professional Counselor

## 2022-12-03 ENCOUNTER — Ambulatory Visit (INDEPENDENT_AMBULATORY_CARE_PROVIDER_SITE_OTHER): Payer: No Typology Code available for payment source | Admitting: Professional Counselor

## 2022-12-03 DIAGNOSIS — F411 Generalized anxiety disorder: Secondary | ICD-10-CM | POA: Diagnosis not present

## 2022-12-03 NOTE — Progress Notes (Signed)
      [facility] Counselor/Therapist Progress Note  Patient ID: [name], MRN: [id number],    Date: 12/03/2022  Time Spent: 3:13p - 4:12p  Treatment Type: Family with patient  Pt present with spouse for session.  Reported Symptoms: nervousness, worries, trouble relaxing, interpersonal concerns   Mental Status Exam:  Appearance:   Neat     Behavior:  Appropriate, Sharing, and Motivated  Motor:  Normal  Speech/Language:   Clear and Coherent and Normal Rate  Affect:  Appropriate and Congruent  Mood:  normal  Thought process:  normal  Thought content:    WNL  Sensory/Perceptual disturbances:    WNL  Orientation:  Sound  Attention:  Good  Concentration:  Good  Memory:  WNL  Fund of knowledge:   Good  Insight:    Good  Judgment:   Good  Impulse Control:  Good   Risk Assessment: Danger to Self:  No Self-injurious Behavior: No Danger to Others: No Duty to Warn:no Physical Aggression / Violence:No  Access to Firearms a concern: No  Gang Involvement:No   Subjective: Pt presented to session with spouse to address concerns of anxiety as relate pt experience over time and as exacerbated by recovery from recent surgery, parenting concerns, and recently reconciled and reintgerated marital relationship. Pt shared regarding updates with her health and next steps for treatment. Pt and spouse reported progress since last session as to STG of working on a plan for increased independence for their daughter. They processed experience of diffcult conversation and ways it transpired, and challenges, difference ideas of approach, and outcome. Counselor assisted in formulating strategies for next steps including creation of collaborative timeline of expectations, and provided supportive resources for couple's concerns. They also discussed STG of budgeting to address pt impulsivity around shopping per anxious motivations, and to alleviate financial stress that leads to conflict, and pt  reported minimal progress; a holiday budget was discussed and provisionally agreed upon by couple. Pt concern for spouse lifestyle choices discussed as well. Pt processed experience of extended family falling out, what led to it, and how it has impacted her. Counselor held space for process work, affirmed, actively listened, and helped with coping and interpersonal communication strategies.   Interventions: Solution-Oriented/Positive Psychology, Humanistic/Existential, Insight-Oriented, and Family Systems  Diagnosis:   ICD-10-CM   1. Generalized anxiety disorder  F41.1       Plan: Pt is scheduled for a follow-up; continue process work and developing coping skills. 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  Gaspar Bidding, [facility]

## 2022-12-04 ENCOUNTER — Encounter: Payer: Self-pay | Admitting: Physical Therapy

## 2022-12-04 ENCOUNTER — Encounter: Payer: Self-pay | Admitting: Psychiatry

## 2022-12-04 ENCOUNTER — Ambulatory Visit: Payer: No Typology Code available for payment source | Admitting: Physical Therapy

## 2022-12-04 DIAGNOSIS — Z96651 Presence of right artificial knee joint: Secondary | ICD-10-CM

## 2022-12-04 DIAGNOSIS — M25661 Stiffness of right knee, not elsewhere classified: Secondary | ICD-10-CM

## 2022-12-04 DIAGNOSIS — M25561 Pain in right knee: Secondary | ICD-10-CM

## 2022-12-04 DIAGNOSIS — M6281 Muscle weakness (generalized): Secondary | ICD-10-CM

## 2022-12-04 NOTE — Therapy (Signed)
OUTPATIENT PHYSICAL THERAPY LOWER EXTREMITY TREATMENT    Patient Name: [name] MRN: [id number] DOB:06-Oct-1960, 62 y.o., female Today's Date: 12/04/2022  END OF SESSION:  PT End of Session - 12/04/22 1706     Visit Number 34    Date for PT Re-Evaluation 12/05/22    Authorization Type [employer]    PT Start Time 1700    PT Stop Time 1745    PT Time Calculation (min) 45 min    Activity Tolerance Patient limited by pain;Patient tolerated treatment well    Behavior During Therapy [facility] for tasks assessed/performed                           Past Medical History:  Diagnosis Date   Arthritis    Back pain of lumbar region with sciatica    Cancer (HCC)    breast   Depression    Diabetes mellitus, type II (HCC)    Family history of anesthesia complication    grandfather died under anesthesia 70's- had heart issues   H/O carpal tunnel repair 2014   Headache(784.0)    Hyperlipidemia    Hypertension    Hypothyroidism    Medial meniscus tear    PONV (postoperative nausea and vomiting)    Sleep apnea    cpap since 10   Past Surgical History:  Procedure Laterality Date   BILATERAL TOTAL MASTECTOMY WITH AXILLARY LYMPH NODE DISSECTION     BREAST RECONSTRUCTION     CERVICAL DISC ARTHROPLASTY N/A 03/19/2013   Procedure: CERVICAL FIVE TO SIX, CERVICAL SIX TO SEVEN CERVICAL ANTERIOR DISC ARTHROPLASTY;  Surgeon: Barnett Abu, MD;  Location: [facility];  Service: Neurosurgery;  Laterality: N/A;  C56 C67 artificial disc replacement   CESAREAN SECTION     COLONOSCOPY     DILATATION & CURETTAGE/HYSTEROSCOPY WITH TRUECLEAR N/A 11/25/2013   Procedure: DILATATION & CURETTAGE/HYSTEROSCOPY WITH TRUCLEAR;  Surgeon: Meriel Pica, MD;  Location: [facility];  Service: Gynecology;  Laterality: N/A;   ECTOPIC PREGNANCY SURGERY     GALLBLADDER SURGERY     MOUTH SURGERY     child- dog bite   SPINAL FUSION     2023   STERIOD INJECTION Left 09/09/2022   Procedure: LEFT KNEE  STEROID INJECTION;  Surgeon: Ollen Gross, MD;  Location: [facility];  Service: Orthopedics;  Laterality: Left;  left knee injection 0928   TONSILLECTOMY     TOTAL KNEE ARTHROPLASTY Right 09/09/2022   Procedure: TOTAL KNEE ARTHROPLASTY;  Surgeon: Ollen Gross, MD;  Location: [facility];  Service: Orthopedics;  Laterality: Right;   TUBAL LIGATION     Patient Active Problem List   Diagnosis Date Noted   OA (osteoarthritis) of knee 09/09/2022   Exertional dyspnea 04/17/2022   Elevated coronary artery calcium score 04/17/2022   Spondylolisthesis at L4-L5 level 12/27/2021   Ductal carcinoma in situ (DCIS) of left breast 01/19/2018   Generalized anxiety disorder 11/07/2017   Depression 11/07/2017   Insomnia 11/07/2017   Cervical spondylosis 03/19/2013    PCP: Creola Corn  REFERRING PROVIDER: Trudee Grip  REFERRING DIAG: R TKA 09/09/22  THERAPY DIAG:  S/P total knee arthroplasty, right  Acute pain of right knee  Stiffness of right knee, not elsewhere classified  Muscle weakness (generalized)  Rationale for Evaluation and Treatment: Rehabilitation  ONSET DATE: 09/09/22  SUBJECTIVE:   SUBJECTIVE STATEMENT :  I am doing well, feeling good today  PERTINENT HISTORY: R TKA 8/19 L knee steroid  injection 09/09/22  PAIN:  Are you having pain? Yes: NPRS scale: 4/10 Pain location: R knee Pain description: sharp medial R knee  Aggravating factors: weight bearing on the RLE, bending, moving it  Relieving factors: ice helps some, pain meds  PRECAUTIONS: None  RED FLAGS: None   WEIGHT BEARING RESTRICTIONS: No  FALLS:  Has patient fallen in last 6 months? No  LIVING ENVIRONMENT: Lives with: lives with their family and lives with their spouse Lives in: House/apartment Stairs: Yes: External: 3 steps; none Has following equipment at home: Single point cane and Walker - 2 wheeled  OCCUPATION: Not working  PLOF: Independent  PATIENT GOALS: No pain, to keep the knee  moving  NEXT MD VISIT: 09/24/22  OBJECTIVE:   PATIENT SURVEYS:  FOTO 4  COGNITION: Overall cognitive status: Within functional limits for tasks assessed      EDEMA:  Increased swelling surrounding R knee  POSTURE: rounded shoulders and flexed trunk   PALPATION: Warm and TTP  LOWER EXTREMITY ROM:  Active ROM Right eval Left eval Right 09/24/22 Right 09/26/22 Right 10/02/22  11/01/22  Hip flexion         Hip extension         Hip abduction         Hip adduction         Hip internal rotation         Hip external rotation         Knee flexion  60  75* AROM, 86 PROM 82* AROM seated per pt request  78* self AAROM, 76* AROM  PROM 87 AROM 81 PROM 90 AROM 83  Knee extension -30  -15* AROM seated, -15 PROM supine   -16* supine heel prop with quad set  AROM 25 PROM 16 AROM 14  Ankle dorsiflexion         Ankle plantarflexion         Ankle inversion         Ankle eversion          (Blank rows = not tested)  LOWER EXTREMITY MMT:  MMT Right eval Left eval  Hip flexion 2   Hip extension    Hip abduction    Hip adduction    Hip internal rotation    Hip external rotation    Knee flexion 2   Knee extension 2   Ankle dorsiflexion    Ankle plantarflexion    Ankle inversion    Ankle eversion     (Blank rows = not tested)   FUNCTIONAL TESTS:  5 times sit to stand: unable to do  Timed up and go (TUG): 1 min 28s  GAIT: Distance walked: in clinic distances Assistive device utilized: Environmental consultant - 2 wheeled Level of assistance: SBA Comments: antalgic gait, slowed cadence, step to pattern, decreased step length and time on RLE    TODAY'S TREATMENT:  DATE:  12/04/22 Nustep level 5 x 6 minutes Leg extension 10# 2x10 Leg press able to get to position #2 today with toe even with foot plate top Fast walk and backward walk Passive stretch multiple ways, with  her in prone, supine, hanging off bed and in sitting  12/02/22 Nustep level 5 x 6 minutes Walking outside with cane curbs LEg extension 10# with passive stretch b/n sets Leg curls 25# Fast walking forward and then backward walking Leg press 30# working on getting down to position #3 with 3 finger widths toe from the top of the foot plate Stairs step over step 4" and 6" Passive stretch to 102 degrees without much difficulty  11/29/22 Bike partial revs x79mins  Leg ext 10# trying to hit weight 2x10, x5 eccentric lowering, 5# RLE x5 HS curls 35# x10, 25# x10 Leg press level 3 2x5 5s holds  Passive stretching into flexion, leg hooked on to table and lowering it until she can hold x3 with 15s holds   11/27/22 Nustep level 5 x 6 minutes Leg extension 10# working on flexion and trying tpo get back to the bottom Leg press Level 3 toes 3 finger widths from the top of foot plate, 6 reps and holding at the bottom for 5 seconds b/n sets Passive stretch into flexion to 105 degrees passively  11/25/22 Nustep level 4 x 6 minutes Leg extension 10# working on the flexion at the bottom Leg press 20# working on the flexion down to position #3 and 1 finger width from toe to top of foot plate Passive stretch of the knee working on flexion  11/22/22 Bike partial revs x 6 minutes Resisted gait 20# 4 way x4 PROM contract/relax and stretching  Anterior and lateral step downs 4"  Leg press 40# 2x10 seat 4  Leg press 20# RLE only at seat 6 x10, then seat 5 x10  11/20/22 Bike partial revs x 5 minutes Leg press 20# working at position #4 and trying to get toes down to 3-4 finger widths from the top of the foot plate Butt touches on tmill 6" step and airex Tmill push 4 x 20 seconds Passive stretch, contract relax Got her to 100 degrees flexion passively  11/18/22 Bike partial revs x 5 minutes 20# leg curls 5# leg extension with each working on end ranges and some holding to get more flexion Leg press  20#  with her having a goal to get to the bottom and have pates touch, position # 4 with 3 fingers from top was where we were able to get to today Gait with sPC, outside negotiating curb, trying to go down with the good leg, a lot of help at first but then able to relax Stairs 4" and 6" up and down working on step over step, needs min A to get to the down with the good leg first Passive stretch focus on flexion  11/15/22 NuStep L5x37mins  Bike partial ranges  Stair training in other building  PROM flexion and extension  5# leg ext RLE 2x10 20# HS curl 2x10 Calf stretch 30s x2 Walking without AD then stepping over obstacles trying to normalize pattern without stopping or thinking about which foot to use   11/13/22 Nustep level 5 x 6 minutes 20# HS curls, some passive extension stretch b/n sets 5# leg extension with passive stretch into flexion and having her try to get back to where the weight touches every rep Sit to stand with 8" step on  tmill and the airex, trying to avoid hip compensation 20# leg press with position #5 3 and 4 finger widths toes from the top All gait in the clinic without device, much improved natural bend, did some backward walking, then walked outside from back door around to the front door PROM different positions  11/11/22 Nustep level 5 x 6 minutes 20# HS curls 4" toe clears 4" lunge stretch 8" step and airex on tmill squatting down to this working on decreased hip hike Passive stretch into flexion, contract relax, STM to the right quad and scar  11/08/22 Passive ROM in multiple positions and with having a goal of touching her heel to the bar under the bed with out raising hip NuStep L5 LE only  4" step overs  Lateral heel taps 4"  Leg ext 10# x10 both legs, RLE x10  HS curls 20# 2x10 Squats with 8" and airex on top of treadmill Calf stretch on slant 15s x3   11/06/22 20# HS curls both legs 10# leg extension but really working on her taking  the leg back enough for the weight to touch with the setting on the second notch Leg press 20# working on coming down lower and again having her be able to see and or hear the end  Feet on ball K2C with a little PT overpressure for knee flexion, bridges and isometric abs Passive ROM in multiple positions and with having a goal of touching her heel to the bar under the bed with out raising hip  11/04/22 Passive stretch in sitting and in supine with leg off the table, scar and joint mobilizations, oscillations. Contract relax, use of the gun on the thigh.  Able to get her to 95 degrees passively  11/01/22 Progress note 20th  NuStep LE only x5 mins  Bike partial revs 3 mins  PROM to R knee  Squats to low box in bars, then to lower stool  3 big laps in gym with cane   10/30/22 NuStep L5x23mins  Step over 4" in bars  Lunges on steps 4" x10 Lateral and anterior step down 4" x10 Leg press 2x10 20#, x10 work on ROM seat 5  PROM to R knee flex/ext  MET pushing into extension and then moving knee into flexion x5 Pushing foot back as far as she can on low mat table and doing STS x8   PATIENT EDUCATION:  Education details: POC and HEP Person educated: Patient Education method: Explanation Education comprehension: verbalized understanding  HOME EXERCISE PROGRAM: Access Code: [id number]  ASSESSMENT:  CLINICAL IMPRESSION:  Patient reports that she did not get as stiff as usual from Monday to today, I tried different positions to get better/more stretch as she is starting to tolerate more and it we are really starting to gain some ground, in prone her quad and the distal patella was very tight. OBJECTIVE IMPAIRMENTS: Abnormal gait, difficulty walking, decreased ROM, decreased strength, and pain.   ACTIVITY LIMITATIONS: bending, sitting, standing, squatting, sleeping, stairs, transfers, bathing, toileting, and locomotion level  PARTICIPATION LIMITATIONS: meal prep, cleaning, laundry, driving,  shopping, and community activity  REHAB POTENTIAL: Good  CLINICAL DECISION MAKING: Stable/uncomplicated  EVALUATION COMPLEXITY: Low   GOALS: Goals reviewed with patient? Yes  SHORT TERM GOALS: Target date: 10/24/22  Patient will be independent with initial HEP. Goal status: 09/20/22 met  2.  Patient will be do TUG < 30s Baseline: 1 min 28s  Goal status: ongoing 10/07/22, 18.57s MET 10/09/22  3.  Patient will  be able to do 5 sit to stands  Baseline: unable to do  Goal status: progressing 10/04/22, MET 10/09/22   LONG TERM GOALS: Target date: 12/05/22  Patient will be independent with advanced/ongoing HEP to improve outcomes and carryover.  Goal status: ongoing 11/06/22  2.  Patient will report at least 75% improvement in R knee pain to improve QOL. Baseline: 10/10 Goal status: IN PROGRESS  60% better 11/20/22  3.  Patient will demonstrate improved R knee AROM to >/= 0-120 deg to allow for normal gait and stair mechanics. Baseline: 30-0-60 10/09/22: 16-0-78  11/01/22: 14-0-83 11/20/22: 10-97 Goal status: IN PROGRESS  4.  Patient will demonstrate improved functional LE strength as demonstrated by 5/5. Baseline: 2/5 Goal status: MET 3+/5 10/09/22, 5/5 11/01/22   5.  Patient will be able to ambulate 500' with LRAD and normal gait pattern without increased pain to access community.  Baseline: using RW small in home ambulation distances Goal status: progressing 12/02/22  6.  Patient will report 47 on FOTO (patient reported outcome measure) to demonstrate improved functional ability. Baseline: 4, 40  Goal status: IN PROGRESS 12/02/22  PLAN:  PT FREQUENCY: 2x/week  PT DURATION: 12 weeks  PLANNED INTERVENTIONS: Therapeutic exercises, Therapeutic activity, Neuromuscular re-education, Balance training, Gait training, Patient/Family education, Self Care, Joint mobilization, Stair training, Electrical stimulation, Cryotherapy, Moist heat, scar mobilization, Vasopneumatic  device, and Manual therapy  PLAN FOR NEXT SESSION: continue to work on her flexion ROM and gait pattern compensations, she will see MD next week   Patient Details  Name: VAUNE MCFARLAN MRN: [id number] Date of Birth: 1960/04/23 Referring Provider:  Ollen Gross, MD  Encounter Date: 12/04/2022   Jearld Lesch, PT 12/04/2022, 5:07 PM

## 2022-12-05 NOTE — Therapy (Signed)
OUTPATIENT PHYSICAL THERAPY LOWER EXTREMITY TREATMENT    Patient Name: [name] MRN: [id number] DOB:April 19, 1960, 62 y.o., female Today's Date: 12/06/2022  END OF SESSION:  PT End of Session - 12/06/22 1059     Visit Number 35    Date for PT Re-Evaluation 01/31/23    Authorization Type [employer]    PT Start Time 1100    PT Stop Time 1145    PT Time Calculation (min) 45 min    Activity Tolerance Patient limited by pain;Patient tolerated treatment well    Behavior During Therapy [facility] for tasks assessed/performed                            Past Medical History:  Diagnosis Date   Arthritis    Back pain of lumbar region with sciatica    Cancer (HCC)    breast   Depression    Diabetes mellitus, type II (HCC)    Family history of anesthesia complication    grandfather died under anesthesia 70's- had heart issues   H/O carpal tunnel repair 2014   Headache(784.0)    Hyperlipidemia    Hypertension    Hypothyroidism    Medial meniscus tear    PONV (postoperative nausea and vomiting)    Sleep apnea    cpap since 10   Past Surgical History:  Procedure Laterality Date   BILATERAL TOTAL MASTECTOMY WITH AXILLARY LYMPH NODE DISSECTION     BREAST RECONSTRUCTION     CERVICAL DISC ARTHROPLASTY N/A 03/19/2013   Procedure: CERVICAL FIVE TO SIX, CERVICAL SIX TO SEVEN CERVICAL ANTERIOR DISC ARTHROPLASTY;  Surgeon: Barnett Abu, MD;  Location: [facility];  Service: Neurosurgery;  Laterality: N/A;  C56 C67 artificial disc replacement   CESAREAN SECTION     COLONOSCOPY     DILATATION & CURETTAGE/HYSTEROSCOPY WITH TRUECLEAR N/A 11/25/2013   Procedure: DILATATION & CURETTAGE/HYSTEROSCOPY WITH TRUCLEAR;  Surgeon: Meriel Pica, MD;  Location: [facility];  Service: Gynecology;  Laterality: N/A;   ECTOPIC PREGNANCY SURGERY     GALLBLADDER SURGERY     MOUTH SURGERY     child- dog bite   SPINAL FUSION     2023   STERIOD INJECTION Left 09/09/2022   Procedure: LEFT KNEE  STEROID INJECTION;  Surgeon: Ollen Gross, MD;  Location: [facility];  Service: Orthopedics;  Laterality: Left;  left knee injection 0928   TONSILLECTOMY     TOTAL KNEE ARTHROPLASTY Right 09/09/2022   Procedure: TOTAL KNEE ARTHROPLASTY;  Surgeon: Ollen Gross, MD;  Location: [facility];  Service: Orthopedics;  Laterality: Right;   TUBAL LIGATION     Patient Active Problem List   Diagnosis Date Noted   OA (osteoarthritis) of knee 09/09/2022   Exertional dyspnea 04/17/2022   Elevated coronary artery calcium score 04/17/2022   Spondylolisthesis at L4-L5 level 12/27/2021   Ductal carcinoma in situ (DCIS) of left breast 01/19/2018   Generalized anxiety disorder 11/07/2017   Depression 11/07/2017   Insomnia 11/07/2017   Cervical spondylosis 03/19/2013    PCP: Creola Corn  REFERRING PROVIDER: Trudee Grip  REFERRING DIAG: R TKA 09/09/22  THERAPY DIAG:  S/P total knee arthroplasty, right  Acute pain of right knee  Stiffness of right knee, not elsewhere classified  Muscle weakness (generalized)  Localized edema  Other abnormalities of gait and mobility  Rationale for Evaluation and Treatment: Rehabilitation  ONSET DATE: 09/09/22  SUBJECTIVE:   SUBJECTIVE STATEMENT :  I am doing well, feeling  good today  PERTINENT HISTORY: R TKA 8/19 L knee steroid injection 09/09/22  PAIN:  Are you having pain? Yes: NPRS scale: 4/10 Pain location: R knee Pain description: sharp medial R knee  Aggravating factors: weight bearing on the RLE, bending, moving it  Relieving factors: ice helps some, pain meds  PRECAUTIONS: None  RED FLAGS: None   WEIGHT BEARING RESTRICTIONS: No  FALLS:  Has patient fallen in last 6 months? No  LIVING ENVIRONMENT: Lives with: lives with their family and lives with their spouse Lives in: House/apartment Stairs: Yes: External: 3 steps; none Has following equipment at home: Single point cane and Walker - 2 wheeled  OCCUPATION: Not working  PLOF:  Independent  PATIENT GOALS: No pain, to keep the knee moving  NEXT MD VISIT: 09/24/22  OBJECTIVE:   PATIENT SURVEYS:  FOTO 4  COGNITION: Overall cognitive status: Within functional limits for tasks assessed      EDEMA:  Increased swelling surrounding R knee  POSTURE: rounded shoulders and flexed trunk   PALPATION: Warm and TTP  LOWER EXTREMITY ROM:  Active ROM Right eval Left eval Right 09/24/22 Right 09/26/22 Right 10/02/22  11/01/22  Hip flexion         Hip extension         Hip abduction         Hip adduction         Hip internal rotation         Hip external rotation         Knee flexion  60  75* AROM, 86 PROM 82* AROM seated per pt request  78* self AAROM, 76* AROM  PROM 87 AROM 81 PROM 90 AROM 83  Knee extension -30  -15* AROM seated, -15 PROM supine   -16* supine heel prop with quad set  AROM 25 PROM 16 AROM 14  Ankle dorsiflexion         Ankle plantarflexion         Ankle inversion         Ankle eversion          (Blank rows = not tested)  LOWER EXTREMITY MMT:  MMT Right eval Left eval  Hip flexion 2   Hip extension    Hip abduction    Hip adduction    Hip internal rotation    Hip external rotation    Knee flexion 2   Knee extension 2   Ankle dorsiflexion    Ankle plantarflexion    Ankle inversion    Ankle eversion     (Blank rows = not tested)   FUNCTIONAL TESTS:  5 times sit to stand: unable to do  Timed up and go (TUG): 1 min 28s  GAIT: Distance walked: in clinic distances Assistive device utilized: Environmental consultant - 2 wheeled Level of assistance: SBA Comments: antalgic gait, slowed cadence, step to pattern, decreased step length and time on RLE    TODAY'S TREATMENT:  DATE:  12/06/22 Recheck goals to recert Passive stretching of R knee  NuStep L5 x60mins LE  Bike L1 partial revs x55mins  Step ups 6" Squats to 8" on  treadmill 2x10  Leg press 40# 2x10 seat 2  12/04/22 Nustep level 5 x 6 minutes Leg extension 10# 2x10 Leg press able to get to position #2 today with toe even with foot plate top Fast walk and backward walk Passive stretch multiple ways, with her in prone, supine, hanging off bed and in sitting  12/02/22 Nustep level 5 x 6 minutes Walking outside with cane curbs LEg extension 10# with passive stretch b/n sets Leg curls 25# Fast walking forward and then backward walking Leg press 30# working on getting down to position #3 with 3 finger widths toe from the top of the foot plate Stairs step over step 4" and 6" Passive stretch to 102 degrees without much difficulty  11/29/22 Bike partial revs x93mins  Leg ext 10# trying to hit weight 2x10, x5 eccentric lowering, 5# RLE x5 HS curls 35# x10, 25# x10 Leg press level 3 2x5 5s holds  Passive stretching into flexion, leg hooked on to table and lowering it until she can hold x3 with 15s holds   11/27/22 Nustep level 5 x 6 minutes Leg extension 10# working on flexion and trying tpo get back to the bottom Leg press Level 3 toes 3 finger widths from the top of foot plate, 6 reps and holding at the bottom for 5 seconds b/n sets Passive stretch into flexion to 105 degrees passively  11/25/22 Nustep level 4 x 6 minutes Leg extension 10# working on the flexion at the bottom Leg press 20# working on the flexion down to position #3 and 1 finger width from toe to top of foot plate Passive stretch of the knee working on flexion  11/22/22 Bike partial revs x 6 minutes Resisted gait 20# 4 way x4 PROM contract/relax and stretching  Anterior and lateral step downs 4"  Leg press 40# 2x10 seat 4  Leg press 20# RLE only at seat 6 x10, then seat 5 x10  11/20/22 Bike partial revs x 5 minutes Leg press 20# working at position #4 and trying to get toes down to 3-4 finger widths from the top of the foot plate Butt touches on tmill 6" step and airex Tmill  push 4 x 20 seconds Passive stretch, contract relax Got her to 100 degrees flexion passively  11/18/22 Bike partial revs x 5 minutes 20# leg curls 5# leg extension with each working on end ranges and some holding to get more flexion Leg press 20#  with her having a goal to get to the bottom and have pates touch, position # 4 with 3 fingers from top was where we were able to get to today Gait with sPC, outside negotiating curb, trying to go down with the good leg, a lot of help at first but then able to relax Stairs 4" and 6" up and down working on step over step, needs min A to get to the down with the good leg first Passive stretch focus on flexion  11/15/22 NuStep L5x71mins  Bike partial ranges  Stair training in other building  PROM flexion and extension  5# leg ext RLE 2x10 20# HS curl 2x10 Calf stretch 30s x2 Walking without AD then stepping over obstacles trying to normalize pattern without stopping or thinking about which foot to use   11/13/22 Nustep level 5 x 6  minutes 20# HS curls, some passive extension stretch b/n sets 5# leg extension with passive stretch into flexion and having her try to get back to where the weight touches every rep Sit to stand with 8" step on tmill and the airex, trying to avoid hip compensation 20# leg press with position #5 3 and 4 finger widths toes from the top All gait in the clinic without device, much improved natural bend, did some backward walking, then walked outside from back door around to the front door PROM different positions  11/11/22 Nustep level 5 x 6 minutes 20# HS curls 4" toe clears 4" lunge stretch 8" step and airex on tmill squatting down to this working on decreased hip hike Passive stretch into flexion, contract relax, STM to the right quad and scar  11/08/22 Passive ROM in multiple positions and with having a goal of touching her heel to the bar under the bed with out raising hip NuStep L5 LE only  4" step  overs  Lateral heel taps 4"  Leg ext 10# x10 both legs, RLE x10  HS curls 20# 2x10 Squats with 8" and airex on top of treadmill Calf stretch on slant 15s x3   11/06/22 20# HS curls both legs 10# leg extension but really working on her taking the leg back enough for the weight to touch with the setting on the second notch Leg press 20# working on coming down lower and again having her be able to see and or hear the end  Feet on ball K2C with a little PT overpressure for knee flexion, bridges and isometric abs Passive ROM in multiple positions and with having a goal of touching her heel to the bar under the bed with out raising hip  11/04/22 Passive stretch in sitting and in supine with leg off the table, scar and joint mobilizations, oscillations. Contract relax, use of the gun on the thigh.  Able to get her to 95 degrees passively  11/01/22 Progress note 20th  NuStep LE only x5 mins  Bike partial revs 3 mins  PROM to R knee  Squats to low box in bars, then to lower stool  3 big laps in gym with cane   10/30/22 NuStep L5x95mins  Step over 4" in bars  Lunges on steps 4" x10 Lateral and anterior step down 4" x10 Leg press 2x10 20#, x10 work on ROM seat 5  PROM to R knee flex/ext  MET pushing into extension and then moving knee into flexion x5 Pushing foot back as far as she can on low mat table and doing STS x8   PATIENT EDUCATION:  Education details: POC and HEP Person educated: Patient Education method: Explanation Education comprehension: verbalized understanding  HOME EXERCISE PROGRAM: Access Code: [id number]  ASSESSMENT:  CLINICAL IMPRESSION:  Patient continues to improve with ROM. Able to actively get to 100d before any warm or stretching. Mostly difficulty with STS from low height. She circumducts with RLE doing step ups, needs cues to try to lift leg straight up. Demonstrates good knee flexion on the leg press today, able to get down to seat 2 and push 40# with feet  even with the top foot plate.   OBJECTIVE IMPAIRMENTS: Abnormal gait, difficulty walking, decreased ROM, decreased strength, and pain.   ACTIVITY LIMITATIONS: bending, sitting, standing, squatting, sleeping, stairs, transfers, bathing, toileting, and locomotion level  PARTICIPATION LIMITATIONS: meal prep, cleaning, laundry, driving, shopping, and community activity  REHAB POTENTIAL: Good  CLINICAL DECISION MAKING: Stable/uncomplicated  EVALUATION COMPLEXITY: Low   GOALS: Goals reviewed with patient? Yes  SHORT TERM GOALS: Target date: 10/24/22  Patient will be independent with initial HEP. Goal status: 09/20/22 met  2.  Patient will be do TUG < 30s Baseline: 1 min 28s  Goal status: ongoing 10/07/22, 18.57s MET 10/09/22  3.  Patient will be able to do 5 sit to stands  Baseline: unable to do  Goal status: progressing 10/04/22, MET 10/09/22   LONG TERM GOALS: Target date: 01/31/23  Patient will be independent with advanced/ongoing HEP to improve outcomes and carryover.  Goal status: ongoing 11/06/22  2.  Patient will report at least 75% improvement in R knee pain to improve QOL. Baseline: 10/10 Goal status: IN PROGRESS  60% better 11/20/22, 65% 12/06/22  3.  Patient will demonstrate improved R knee AROM to >/= 0-120 deg to allow for normal gait and stair mechanics. Baseline: 30-0-60 10/09/22: 16-0-78  11/01/22: 14-0-83 11/20/22: 10-97 12/06/22: 10-0-100 Goal status: IN PROGRESS  4.  Patient will demonstrate improved functional LE strength as demonstrated by 5/5. Baseline: 2/5 Goal status: MET 3+/5 10/09/22, 5/5 11/01/22   5.  Patient will be able to ambulate 500' with LRAD and normal gait pattern without increased pain to access community.  Baseline: using RW small in home ambulation distances Goal status: progressing w/SPC 12/02/22  6.  Patient will report 48 on FOTO (patient reported outcome measure) to demonstrate improved functional ability. Baseline: 4, 40  Goal  status: IN PROGRESS 12/02/22  PLAN:  PT FREQUENCY: 2x/week  PT DURATION: 12 weeks  PLANNED INTERVENTIONS: Therapeutic exercises, Therapeutic activity, Neuromuscular re-education, Balance training, Gait training, Patient/Family education, Self Care, Joint mobilization, Stair training, Electrical stimulation, Cryotherapy, Moist heat, scar mobilization, Vasopneumatic device, and Manual therapy  PLAN FOR NEXT SESSION: continue to work on her flexion ROM and gait pattern compensations, she will see MD on Tuesday   Patient Details  Name: [name] MRN: [id number] Date of Birth: 07-Jul-1960 Referring Provider:  Ollen Gross, MD  Encounter Date: 12/06/2022   Cassie Freer, PT 12/06/2022, 11:46 AM

## 2022-12-06 ENCOUNTER — Ambulatory Visit: Payer: No Typology Code available for payment source

## 2022-12-06 DIAGNOSIS — R600 Localized edema: Secondary | ICD-10-CM

## 2022-12-06 DIAGNOSIS — M25661 Stiffness of right knee, not elsewhere classified: Secondary | ICD-10-CM

## 2022-12-06 DIAGNOSIS — M25561 Pain in right knee: Secondary | ICD-10-CM

## 2022-12-06 DIAGNOSIS — M6281 Muscle weakness (generalized): Secondary | ICD-10-CM

## 2022-12-06 DIAGNOSIS — Z96651 Presence of right artificial knee joint: Secondary | ICD-10-CM

## 2022-12-06 DIAGNOSIS — R2689 Other abnormalities of gait and mobility: Secondary | ICD-10-CM

## 2022-12-09 ENCOUNTER — Ambulatory Visit: Payer: No Typology Code available for payment source | Admitting: Physical Therapy

## 2022-12-09 ENCOUNTER — Encounter: Payer: Self-pay | Admitting: Physical Therapy

## 2022-12-09 DIAGNOSIS — Z96651 Presence of right artificial knee joint: Secondary | ICD-10-CM | POA: Diagnosis not present

## 2022-12-09 DIAGNOSIS — M25661 Stiffness of right knee, not elsewhere classified: Secondary | ICD-10-CM

## 2022-12-09 DIAGNOSIS — R600 Localized edema: Secondary | ICD-10-CM

## 2022-12-09 DIAGNOSIS — M6281 Muscle weakness (generalized): Secondary | ICD-10-CM

## 2022-12-09 DIAGNOSIS — M25561 Pain in right knee: Secondary | ICD-10-CM

## 2022-12-09 NOTE — Therapy (Signed)
OUTPATIENT PHYSICAL THERAPY LOWER EXTREMITY TREATMENT    Patient Name: [name] MRN: [id number] DOB:May 24, 1960, 62 y.o., female Today's Date: 12/09/2022  END OF SESSION:  PT End of Session - 12/09/22 1701     Visit Number 36    Date for PT Re-Evaluation 01/31/23    Authorization Type [employer]    PT Start Time 1700    PT Stop Time 1750    PT Time Calculation (min) 50 min    Activity Tolerance Patient tolerated treatment well    Behavior During Therapy WFL for tasks assessed/performed                            Past Medical History:  Diagnosis Date   Arthritis    Back pain of lumbar region with sciatica    Cancer (HCC)    breast   Depression    Diabetes mellitus, type II (HCC)    Family history of anesthesia complication    grandfather died under anesthesia 70's- had heart issues   H/O carpal tunnel repair 2014   Headache(784.0)    Hyperlipidemia    Hypertension    Hypothyroidism    Medial meniscus tear    PONV (postoperative nausea and vomiting)    Sleep apnea    cpap since 10   Past Surgical History:  Procedure Laterality Date   BILATERAL TOTAL MASTECTOMY WITH AXILLARY LYMPH NODE DISSECTION     BREAST RECONSTRUCTION     CERVICAL DISC ARTHROPLASTY N/A 03/19/2013   Procedure: CERVICAL FIVE TO SIX, CERVICAL SIX TO SEVEN CERVICAL ANTERIOR DISC ARTHROPLASTY;  Surgeon: Barnett Abu, MD;  Location: [facility];  Service: Neurosurgery;  Laterality: N/A;  C56 C67 artificial disc replacement   CESAREAN SECTION     COLONOSCOPY     DILATATION & CURETTAGE/HYSTEROSCOPY WITH TRUECLEAR N/A 11/25/2013   Procedure: DILATATION & CURETTAGE/HYSTEROSCOPY WITH TRUCLEAR;  Surgeon: Meriel Pica, MD;  Location: [facility];  Service: Gynecology;  Laterality: N/A;   ECTOPIC PREGNANCY SURGERY     GALLBLADDER SURGERY     MOUTH SURGERY     child- dog bite   SPINAL FUSION     2023   STERIOD INJECTION Left 09/09/2022   Procedure: LEFT KNEE STEROID INJECTION;  Surgeon:  Ollen Gross, MD;  Location: [facility];  Service: Orthopedics;  Laterality: Left;  left knee injection 0928   TONSILLECTOMY     TOTAL KNEE ARTHROPLASTY Right 09/09/2022   Procedure: TOTAL KNEE ARTHROPLASTY;  Surgeon: Ollen Gross, MD;  Location: [facility];  Service: Orthopedics;  Laterality: Right;   TUBAL LIGATION     Patient Active Problem List   Diagnosis Date Noted   OA (osteoarthritis) of knee 09/09/2022   Exertional dyspnea 04/17/2022   Elevated coronary artery calcium score 04/17/2022   Spondylolisthesis at L4-L5 level 12/27/2021   Ductal carcinoma in situ (DCIS) of left breast 01/19/2018   Generalized anxiety disorder 11/07/2017   Depression 11/07/2017   Insomnia 11/07/2017   Cervical spondylosis 03/19/2013    PCP: Creola Corn  REFERRING PROVIDER: Trudee Grip  REFERRING DIAG: R TKA 09/09/22  THERAPY DIAG:  S/P total knee arthroplasty, right  Acute pain of right knee  Stiffness of right knee, not elsewhere classified  Muscle weakness (generalized)  Localized edema  Rationale for Evaluation and Treatment: Rehabilitation  ONSET DATE: 09/09/22  SUBJECTIVE:   SUBJECTIVE STATEMENT :  I am doing well, I get stiff and sore at times  PERTINENT HISTORY: R  TKA 8/19 L knee steroid injection 09/09/22  PAIN:  Are you having pain? Yes: NPRS scale: 4/10 Pain location: R knee Pain description: sharp medial R knee  Aggravating factors: weight bearing on the RLE, bending, moving it  Relieving factors: ice helps some, pain meds  PRECAUTIONS: None  RED FLAGS: None   WEIGHT BEARING RESTRICTIONS: No  FALLS:  Has patient fallen in last 6 months? No  LIVING ENVIRONMENT: Lives with: lives with their family and lives with their spouse Lives in: House/apartment Stairs: Yes: External: 3 steps; none Has following equipment at home: Single point cane and Walker - 2 wheeled  OCCUPATION: Not working  PLOF: Independent  PATIENT GOALS: No pain, to keep the knee  moving  NEXT MD VISIT: 09/24/22  OBJECTIVE:   PATIENT SURVEYS:  FOTO 4  COGNITION: Overall cognitive status: Within functional limits for tasks assessed      EDEMA:  Increased swelling surrounding R knee  POSTURE: rounded shoulders and flexed trunk   PALPATION: Warm and TTP  LOWER EXTREMITY ROM:  Active ROM Right eval Left eval Right 09/24/22 Right 09/26/22 Right 10/02/22  11/01/22 12/09/22  Hip flexion          Hip extension          Hip abduction          Hip adduction          Hip internal rotation          Hip external rotation          Knee flexion  60  75* AROM, 86 PROM 82* AROM seated per pt request  78* self AAROM, 76* AROM  PROM 87 AROM 81 PROM 90 AROM 83 AROM 101 PROM 105  Knee extension -30  -15* AROM seated, -15 PROM supine   -16* supine heel prop with quad set  AROM 25 PROM 16 AROM 14 AROM 5 PROM 0  Ankle dorsiflexion          Ankle plantarflexion          Ankle inversion          Ankle eversion           (Blank rows = not tested)  LOWER EXTREMITY MMT:  MMT Right eval Left eval  Hip flexion 2   Hip extension    Hip abduction    Hip adduction    Hip internal rotation    Hip external rotation    Knee flexion 2   Knee extension 2   Ankle dorsiflexion    Ankle plantarflexion    Ankle inversion    Ankle eversion     (Blank rows = not tested)   FUNCTIONAL TESTS:  5 times sit to stand: unable to do  Timed up and go (TUG): 1 min 28s  GAIT: Distance walked: in clinic distances Assistive device utilized: Environmental consultant - 2 wheeled Level of assistance: SBA Comments: antalgic gait, slowed cadence, step to pattern, decreased step length and time on RLE    TODAY'S TREATMENT:  DATE:  12/09/22 Gait outside around parking [place] no device good speed Nustep level 5 x 5 minutes Lunge step stretch LEg press working on flexion PROM to  the right knee biggest focus on flexion but also did extension  12/06/22 Recheck goals to recert Passive stretching of R knee  NuStep L5 x31mins LE  Bike L1 partial revs x49mins  Step ups 6" Squats to 8" on treadmill 2x10  Leg press 40# 2x10 seat 2  12/04/22 Nustep level 5 x 6 minutes Leg extension 10# 2x10 Leg press able to get to position #2 today with toe even with foot plate top Fast walk and backward walk Passive stretch multiple ways, with her in prone, supine, hanging off bed and in sitting  12/02/22 Nustep level 5 x 6 minutes Walking outside with cane curbs LEg extension 10# with passive stretch b/n sets Leg curls 25# Fast walking forward and then backward walking Leg press 30# working on getting down to position #3 with 3 finger widths toe from the top of the foot plate Stairs step over step 4" and 6" Passive stretch to 102 degrees without much difficulty  11/29/22 Bike partial revs x45mins  Leg ext 10# trying to hit weight 2x10, x5 eccentric lowering, 5# RLE x5 HS curls 35# x10, 25# x10 Leg press level 3 2x5 5s holds  Passive stretching into flexion, leg hooked on to table and lowering it until she can hold x3 with 15s holds   11/27/22 Nustep level 5 x 6 minutes Leg extension 10# working on flexion and trying tpo get back to the bottom Leg press Level 3 toes 3 finger widths from the top of foot plate, 6 reps and holding at the bottom for 5 seconds b/n sets Passive stretch into flexion to 105 degrees passively  11/25/22 Nustep level 4 x 6 minutes Leg extension 10# working on the flexion at the bottom Leg press 20# working on the flexion down to position #3 and 1 finger width from toe to top of foot plate Passive stretch of the knee working on flexion  11/22/22 Bike partial revs x 6 minutes Resisted gait 20# 4 way x4 PROM contract/relax and stretching  Anterior and lateral step downs 4"  Leg press 40# 2x10 seat 4  Leg press 20# RLE only at seat 6 x10, then seat 5  x10  11/20/22 Bike partial revs x 5 minutes Leg press 20# working at position #4 and trying to get toes down to 3-4 finger widths from the top of the foot plate Butt touches on tmill 6" step and airex Tmill push 4 x 20 seconds Passive stretch, contract relax Got her to 100 degrees flexion passively  11/18/22 Bike partial revs x 5 minutes 20# leg curls 5# leg extension with each working on end ranges and some holding to get more flexion Leg press 20#  with her having a goal to get to the bottom and have pates touch, position # 4 with 3 fingers from top was where we were able to get to today Gait with sPC, outside negotiating curb, trying to go down with the good leg, a lot of help at first but then able to relax Stairs 4" and 6" up and down working on step over step, needs min A to get to the down with the good leg first Passive stretch focus on flexion  11/15/22 NuStep L5x11mins  Bike partial ranges  Stair training in other building  PROM flexion and extension  5# leg ext  RLE 2x10 20# HS curl 2x10 Calf stretch 30s x2 Walking without AD then stepping over obstacles trying to normalize pattern without stopping or thinking about which foot to use  PATIENT EDUCATION:  Education details: POC and HEP Person educated: Patient Education method: Explanation Education comprehension: verbalized understanding  HOME EXERCISE PROGRAM: Access Code: [id number]  ASSESSMENT:  CLINICAL IMPRESSION:  Patient continues to improve with ROM. PAtient today was able to get 5-101 degrees flexion, Passively 0-105 degrees flexion, the end range feels like it is giving more and she is absolutely tolerating much better.  She circumducts with RLE doing step ups, needs cues to try to lift leg straight up. Her gait pattern is much improved with less of the hitch at toe off, a much better and more natural bend, still with SPC but we are starting to do more without SPC  OBJECTIVE IMPAIRMENTS: Abnormal  gait, difficulty walking, decreased ROM, decreased strength, and pain.   ACTIVITY LIMITATIONS: bending, sitting, standing, squatting, sleeping, stairs, transfers, bathing, toileting, and locomotion level  PARTICIPATION LIMITATIONS: meal prep, cleaning, laundry, driving, shopping, and community activity  REHAB POTENTIAL: Good  CLINICAL DECISION MAKING: Stable/uncomplicated  EVALUATION COMPLEXITY: Low   GOALS: Goals reviewed with patient? Yes  SHORT TERM GOALS: Target date: 10/24/22  Patient will be independent with initial HEP. Goal status: 09/20/22 met  2.  Patient will be do TUG < 30s Baseline: 1 min 28s  Goal status: ongoing 10/07/22, 18.57s MET 10/09/22  3.  Patient will be able to do 5 sit to stands  Baseline: unable to do  Goal status: progressing 10/04/22, MET 10/09/22   LONG TERM GOALS: Target date: 01/31/23  Patient will be independent with advanced/ongoing HEP to improve outcomes and carryover.  Goal status: ongoing 11/06/22  2.  Patient will report at least 75% improvement in R knee pain to improve QOL. Baseline: 10/10 Goal status: IN PROGRESS  60% better 11/20/22, 65% 12/06/22  3.  Patient will demonstrate improved R knee AROM to >/= 0-120 deg to allow for normal gait and stair mechanics. Baseline: 30-0-60 10/09/22: 16-0-78  11/01/22: 14-0-83 11/20/22: 10-97 12/06/22: 10-0-100 Goal status: IN PROGRESS  4.  Patient will demonstrate improved functional LE strength as demonstrated by 5/5. Baseline: 2/5 Goal status: MET 3+/5 10/09/22, 5/5 11/01/22   5.  Patient will be able to ambulate 500' with LRAD and normal gait pattern without increased pain to access community.  Baseline: using RW small in home ambulation distances Goal status: progressing w/SPC 12/02/22  6.  Patient will report 29 on FOTO (patient reported outcome measure) to demonstrate improved functional ability. Baseline: 4, 40  Goal status: IN PROGRESS 12/02/22  PLAN:  PT FREQUENCY:  2x/week  PT DURATION: 12 weeks  PLANNED INTERVENTIONS: Therapeutic exercises, Therapeutic activity, Neuromuscular re-education, Balance training, Gait training, Patient/Family education, Self Care, Joint mobilization, Stair training, Electrical stimulation, Cryotherapy, Moist heat, scar mobilization, Vasopneumatic device, and Manual therapy  PLAN FOR NEXT SESSION: She will see MD tomorrow and will proceed how the MD feels what is best    Patient Details  Name: [name] MRN: [id number] Date of Birth: 04/07/1960 Referring Provider:  Ollen Gross, MD  Encounter Date: 12/09/2022   Jearld Lesch, PT 12/09/2022, 5:06 PM

## 2022-12-11 ENCOUNTER — Encounter: Payer: Self-pay | Admitting: Physical Therapy

## 2022-12-11 ENCOUNTER — Ambulatory Visit: Payer: No Typology Code available for payment source | Admitting: Physical Therapy

## 2022-12-11 DIAGNOSIS — M6281 Muscle weakness (generalized): Secondary | ICD-10-CM

## 2022-12-11 DIAGNOSIS — Z96651 Presence of right artificial knee joint: Secondary | ICD-10-CM | POA: Diagnosis not present

## 2022-12-11 DIAGNOSIS — M25661 Stiffness of right knee, not elsewhere classified: Secondary | ICD-10-CM

## 2022-12-11 DIAGNOSIS — R600 Localized edema: Secondary | ICD-10-CM

## 2022-12-11 DIAGNOSIS — M25561 Pain in right knee: Secondary | ICD-10-CM

## 2022-12-11 NOTE — Therapy (Signed)
OUTPATIENT PHYSICAL THERAPY LOWER EXTREMITY TREATMENT    Patient Name: [name] MRN: [id number] DOB:24-Apr-1960, 62 y.o., female Today's Date: 12/11/2022  END OF SESSION:  PT End of Session - 12/11/22 1705     Visit Number 37    Date for PT Re-Evaluation 01/31/23    Authorization Type [employer]    PT Start Time 1700    PT Stop Time 1745    PT Time Calculation (min) 45 min    Activity Tolerance Patient tolerated treatment well    Behavior During Therapy WFL for tasks assessed/performed                            Past Medical History:  Diagnosis Date   Arthritis    Back pain of lumbar region with sciatica    Cancer (HCC)    breast   Depression    Diabetes mellitus, type II (HCC)    Family history of anesthesia complication    grandfather died under anesthesia 70's- had heart issues   H/O carpal tunnel repair 2014   Headache(784.0)    Hyperlipidemia    Hypertension    Hypothyroidism    Medial meniscus tear    PONV (postoperative nausea and vomiting)    Sleep apnea    cpap since 10   Past Surgical History:  Procedure Laterality Date   BILATERAL TOTAL MASTECTOMY WITH AXILLARY LYMPH NODE DISSECTION     BREAST RECONSTRUCTION     CERVICAL DISC ARTHROPLASTY N/A 03/19/2013   Procedure: CERVICAL FIVE TO SIX, CERVICAL SIX TO SEVEN CERVICAL ANTERIOR DISC ARTHROPLASTY;  Surgeon: Barnett Abu, MD;  Location: [facility];  Service: Neurosurgery;  Laterality: N/A;  C56 C67 artificial disc replacement   CESAREAN SECTION     COLONOSCOPY     DILATATION & CURETTAGE/HYSTEROSCOPY WITH TRUECLEAR N/A 11/25/2013   Procedure: DILATATION & CURETTAGE/HYSTEROSCOPY WITH TRUCLEAR;  Surgeon: Meriel Pica, MD;  Location: [facility];  Service: Gynecology;  Laterality: N/A;   ECTOPIC PREGNANCY SURGERY     GALLBLADDER SURGERY     MOUTH SURGERY     child- dog bite   SPINAL FUSION     2023   STERIOD INJECTION Left 09/09/2022   Procedure: LEFT KNEE STEROID INJECTION;  Surgeon:  Ollen Gross, MD;  Location: [facility];  Service: Orthopedics;  Laterality: Left;  left knee injection 0928   TONSILLECTOMY     TOTAL KNEE ARTHROPLASTY Right 09/09/2022   Procedure: TOTAL KNEE ARTHROPLASTY;  Surgeon: Ollen Gross, MD;  Location: [facility];  Service: Orthopedics;  Laterality: Right;   TUBAL LIGATION     Patient Active Problem List   Diagnosis Date Noted   OA (osteoarthritis) of knee 09/09/2022   Exertional dyspnea 04/17/2022   Elevated coronary artery calcium score 04/17/2022   Spondylolisthesis at L4-L5 level 12/27/2021   Ductal carcinoma in situ (DCIS) of left breast 01/19/2018   Generalized anxiety disorder 11/07/2017   Depression 11/07/2017   Insomnia 11/07/2017   Cervical spondylosis 03/19/2013    PCP: Creola Corn  REFERRING PROVIDER: Trudee Grip  REFERRING DIAG: R TKA 09/09/22  THERAPY DIAG:  S/P total knee arthroplasty, right  Acute pain of right knee  Stiffness of right knee, not elsewhere classified  Muscle weakness (generalized)  Localized edema  Rationale for Evaluation and Treatment: Rehabilitation  ONSET DATE: 09/09/22  SUBJECTIVE:   SUBJECTIVE STATEMENT :  I saw the MD and he feels like I am progressing well enough and don;t  need the manipulation  PERTINENT HISTORY: R TKA 8/19 L knee steroid injection 09/09/22  PAIN:  Are you having pain? Yes: NPRS scale: 4/10 Pain location: R knee Pain description: sharp medial R knee  Aggravating factors: weight bearing on the RLE, bending, moving it  Relieving factors: ice helps some, pain meds  PRECAUTIONS: None  RED FLAGS: None   WEIGHT BEARING RESTRICTIONS: No  FALLS:  Has patient fallen in last 6 months? No  LIVING ENVIRONMENT: Lives with: lives with their family and lives with their spouse Lives in: House/apartment Stairs: Yes: External: 3 steps; none Has following equipment at home: Single point cane and Walker - 2 wheeled  OCCUPATION: Not working  PLOF: Independent  PATIENT  GOALS: No pain, to keep the knee moving  NEXT MD VISIT: 09/24/22  OBJECTIVE:   PATIENT SURVEYS:  FOTO 4  COGNITION: Overall cognitive status: Within functional limits for tasks assessed      EDEMA:  Increased swelling surrounding R knee  POSTURE: rounded shoulders and flexed trunk   PALPATION: Warm and TTP  LOWER EXTREMITY ROM:  Active ROM Right eval Left eval Right 09/24/22 Right 09/26/22 Right 10/02/22  11/01/22 12/09/22  Hip flexion          Hip extension          Hip abduction          Hip adduction          Hip internal rotation          Hip external rotation          Knee flexion  60  75* AROM, 86 PROM 82* AROM seated per pt request  78* self AAROM, 76* AROM  PROM 87 AROM 81 PROM 90 AROM 83 AROM 101 PROM 105  Knee extension -30  -15* AROM seated, -15 PROM supine   -16* supine heel prop with quad set  AROM 25 PROM 16 AROM 14 AROM 5 PROM 0  Ankle dorsiflexion          Ankle plantarflexion          Ankle inversion          Ankle eversion           (Blank rows = not tested)  LOWER EXTREMITY MMT:  MMT Right eval Left eval  Hip flexion 2   Hip extension    Hip abduction    Hip adduction    Hip internal rotation    Hip external rotation    Knee flexion 2   Knee extension 2   Ankle dorsiflexion    Ankle plantarflexion    Ankle inversion    Ankle eversion     (Blank rows = not tested)   FUNCTIONAL TESTS:  5 times sit to stand: unable to do  Timed up and go (TUG): 1 min 28s  GAIT: Distance walked: in clinic distances Assistive device utilized: Environmental consultant - 2 wheeled Level of assistance: SBA Comments: antalgic gait, slowed cadence, step to pattern, decreased step length and time on RLE    TODAY'S TREATMENT:  DATE:  12/11/22 Nustep level 5 x 5 minutes Bike full revolutions with cheating x 5 minutes LEg extension 10# with passive  extension LEg press 20# working on flexion PROM really focus on flexion  12/09/22 Gait outside around parking [place] no device good speed Nustep level 5 x 5 minutes Lunge step stretch LEg press working on flexion PROM to the right knee biggest focus on flexion but also did extension  12/06/22 Recheck goals to recert Passive stretching of R knee  NuStep L5 x26mins LE  Bike L1 partial revs x61mins  Step ups 6" Squats to 8" on treadmill 2x10  Leg press 40# 2x10 seat 2  12/04/22 Nustep level 5 x 6 minutes Leg extension 10# 2x10 Leg press able to get to position #2 today with toe even with foot plate top Fast walk and backward walk Passive stretch multiple ways, with her in prone, supine, hanging off bed and in sitting  12/02/22 Nustep level 5 x 6 minutes Walking outside with cane curbs LEg extension 10# with passive stretch b/n sets Leg curls 25# Fast walking forward and then backward walking Leg press 30# working on getting down to position #3 with 3 finger widths toe from the top of the foot plate Stairs step over step 4" and 6" Passive stretch to 102 degrees without much difficulty  11/29/22 Bike partial revs x26mins  Leg ext 10# trying to hit weight 2x10, x5 eccentric lowering, 5# RLE x5 HS curls 35# x10, 25# x10 Leg press level 3 2x5 5s holds  Passive stretching into flexion, leg hooked on to table and lowering it until she can hold x3 with 15s holds   11/27/22 Nustep level 5 x 6 minutes Leg extension 10# working on flexion and trying tpo get back to the bottom Leg press Level 3 toes 3 finger widths from the top of foot plate, 6 reps and holding at the bottom for 5 seconds b/n sets Passive stretch into flexion to 105 degrees passively  11/25/22 Nustep level 4 x 6 minutes Leg extension 10# working on the flexion at the bottom Leg press 20# working on the flexion down to position #3 and 1 finger width from toe to top of foot plate Passive stretch of the knee working on  flexion  11/22/22 Bike partial revs x 6 minutes Resisted gait 20# 4 way x4 PROM contract/relax and stretching  Anterior and lateral step downs 4"  Leg press 40# 2x10 seat 4  Leg press 20# RLE only at seat 6 x10, then seat 5 x10  11/20/22 Bike partial revs x 5 minutes Leg press 20# working at position #4 and trying to get toes down to 3-4 finger widths from the top of the foot plate Butt touches on tmill 6" step and airex Tmill push 4 x 20 seconds Passive stretch, contract relax Got her to 100 degrees flexion passively  11/18/22 Bike partial revs x 5 minutes 20# leg curls 5# leg extension with each working on end ranges and some holding to get more flexion Leg press 20#  with her having a goal to get to the bottom and have pates touch, position # 4 with 3 fingers from top was where we were able to get to today Gait with sPC, outside negotiating curb, trying to go down with the good leg, a lot of help at first but then able to relax Stairs 4" and 6" up and down working on step over step, needs min A to get to the down with  the good leg first Passive stretch focus on flexion  11/15/22 NuStep L5x87mins  Bike partial ranges  Stair training in other building  PROM flexion and extension  5# leg ext RLE 2x10 20# HS curl 2x10 Calf stretch 30s x2 Walking without AD then stepping over obstacles trying to normalize pattern without stopping or thinking about which foot to use  PATIENT EDUCATION:  Education details: POC and HEP Person educated: Patient Education method: Explanation Education comprehension: verbalized understanding  HOME EXERCISE PROGRAM: Access Code: [id number]  ASSESSMENT:  CLINICAL IMPRESSION:  Patient continues to improve with ROM. PAtient today was able to get 5-101 degrees flexion, Passively 0-105 degrees flexion, the end range feels like it is giving more and she is absolutely tolerating much better.  We continue to really push and progress with the  flexion, she is very pleased that the MD feels that she is progressing and does not need a manipulation  OBJECTIVE IMPAIRMENTS: Abnormal gait, difficulty walking, decreased ROM, decreased strength, and pain.   ACTIVITY LIMITATIONS: bending, sitting, standing, squatting, sleeping, stairs, transfers, bathing, toileting, and locomotion level  PARTICIPATION LIMITATIONS: meal prep, cleaning, laundry, driving, shopping, and community activity  REHAB POTENTIAL: Good  CLINICAL DECISION MAKING: Stable/uncomplicated  EVALUATION COMPLEXITY: Low   GOALS: Goals reviewed with patient? Yes  SHORT TERM GOALS: Target date: 10/24/22  Patient will be independent with initial HEP. Goal status: 09/20/22 met  2.  Patient will be do TUG < 30s Baseline: 1 min 28s  Goal status: ongoing 10/07/22, 18.57s MET 10/09/22  3.  Patient will be able to do 5 sit to stands  Baseline: unable to do  Goal status: progressing 10/04/22, MET 10/09/22   LONG TERM GOALS: Target date: 01/31/23  Patient will be independent with advanced/ongoing HEP to improve outcomes and carryover.  Goal status: ongoing 11/06/22  2.  Patient will report at least 75% improvement in R knee pain to improve QOL. Baseline: 10/10 Goal status: IN PROGRESS  60% better 11/20/22, 65% 12/06/22  3.  Patient will demonstrate improved R knee AROM to >/= 0-120 deg to allow for normal gait and stair mechanics. Baseline: 30-0-60 10/09/22: 16-0-78  11/01/22: 14-0-83 11/20/22: 10-97 12/06/22: 10-0-100 Goal status: IN PROGRESS  4.  Patient will demonstrate improved functional LE strength as demonstrated by 5/5. Baseline: 2/5 Goal status: MET 3+/5 10/09/22, 5/5 11/01/22   5.  Patient will be able to ambulate 500' with LRAD and normal gait pattern without increased pain to access community.  Baseline: using RW small in home ambulation distances Goal status: progressing w/SPC 12/02/22  6.  Patient will report 4 on FOTO (patient reported outcome  measure) to demonstrate improved functional ability. Baseline: 4, 40  Goal status: IN PROGRESS 12/02/22  PLAN:  PT FREQUENCY: 2x/week  PT DURATION: 12 weeks  PLANNED INTERVENTIONS: Therapeutic exercises, Therapeutic activity, Neuromuscular re-education, Balance training, Gait training, Patient/Family education, Self Care, Joint mobilization, Stair training, Electrical stimulation, Cryotherapy, Moist heat, scar mobilization, Vasopneumatic device, and Manual therapy  PLAN FOR NEXT SESSION: We will continue to push her ROM and functional gait and strength  Patient Details  Name: [name] MRN: [id number] Date of Birth: 03-Mar-1960 Referring Provider:  Ollen Gross, MD  Encounter Date: 12/11/2022   Jearld Lesch, PT 12/11/2022, 5:05 PM

## 2022-12-12 NOTE — Therapy (Signed)
OUTPATIENT PHYSICAL THERAPY LOWER EXTREMITY TREATMENT    Patient Name: [name] MRN: [id number] DOB:1960-11-01, 62 y.o., female Today's Date: 12/13/2022  END OF SESSION:  PT End of Session - 12/13/22 1107     Visit Number 38    Date for PT Re-Evaluation 01/31/23    Authorization Type [employer]    PT Start Time 1100    PT Stop Time 1145    PT Time Calculation (min) 45 min    Activity Tolerance Patient tolerated treatment well    Behavior During Therapy WFL for tasks assessed/performed                             Past Medical History:  Diagnosis Date   Arthritis    Back pain of lumbar region with sciatica    Cancer (HCC)    breast   Depression    Diabetes mellitus, type II (HCC)    Family history of anesthesia complication    grandfather died under anesthesia 70's- had heart issues   H/O carpal tunnel repair 2014   Headache(784.0)    Hyperlipidemia    Hypertension    Hypothyroidism    Medial meniscus tear    PONV (postoperative nausea and vomiting)    Sleep apnea    cpap since 10   Past Surgical History:  Procedure Laterality Date   BILATERAL TOTAL MASTECTOMY WITH AXILLARY LYMPH NODE DISSECTION     BREAST RECONSTRUCTION     CERVICAL DISC ARTHROPLASTY N/A 03/19/2013   Procedure: CERVICAL FIVE TO SIX, CERVICAL SIX TO SEVEN CERVICAL ANTERIOR DISC ARTHROPLASTY;  Surgeon: Barnett Abu, MD;  Location: [facility];  Service: Neurosurgery;  Laterality: N/A;  C56 C67 artificial disc replacement   CESAREAN SECTION     COLONOSCOPY     DILATATION & CURETTAGE/HYSTEROSCOPY WITH TRUECLEAR N/A 11/25/2013   Procedure: DILATATION & CURETTAGE/HYSTEROSCOPY WITH TRUCLEAR;  Surgeon: Meriel Pica, MD;  Location: [facility];  Service: Gynecology;  Laterality: N/A;   ECTOPIC PREGNANCY SURGERY     GALLBLADDER SURGERY     MOUTH SURGERY     child- dog bite   SPINAL FUSION     2023   STERIOD INJECTION Left 09/09/2022   Procedure: LEFT KNEE STEROID INJECTION;   Surgeon: Ollen Gross, MD;  Location: [facility];  Service: Orthopedics;  Laterality: Left;  left knee injection 0928   TONSILLECTOMY     TOTAL KNEE ARTHROPLASTY Right 09/09/2022   Procedure: TOTAL KNEE ARTHROPLASTY;  Surgeon: Ollen Gross, MD;  Location: [facility];  Service: Orthopedics;  Laterality: Right;   TUBAL LIGATION     Patient Active Problem List   Diagnosis Date Noted   OA (osteoarthritis) of knee 09/09/2022   Exertional dyspnea 04/17/2022   Elevated coronary artery calcium score 04/17/2022   Spondylolisthesis at L4-L5 level 12/27/2021   Ductal carcinoma in situ (DCIS) of left breast 01/19/2018   Generalized anxiety disorder 11/07/2017   Depression 11/07/2017   Insomnia 11/07/2017   Cervical spondylosis 03/19/2013    PCP: Creola Corn  REFERRING PROVIDER: Trudee Grip  REFERRING DIAG: R TKA 09/09/22  THERAPY DIAG:  S/P total knee arthroplasty, right  Acute pain of right knee  Stiffness of right knee, not elsewhere classified  Muscle weakness (generalized)  Localized edema  Other abnormalities of gait and mobility  Rationale for Evaluation and Treatment: Rehabilitation  ONSET DATE: 09/09/22  SUBJECTIVE:   SUBJECTIVE STATEMENT :  Did a quarter mile on the bike  with a lot of leaning, it hurt my back and hips. Sore this morning.   PERTINENT HISTORY: R TKA 8/19 L knee steroid injection 09/09/22  PAIN:  Are you having pain? Yes: NPRS scale: 4/10 Pain location: R knee Pain description: sharp medial R knee  Aggravating factors: weight bearing on the RLE, bending, moving it  Relieving factors: ice helps some, pain meds  PRECAUTIONS: None  RED FLAGS: None   WEIGHT BEARING RESTRICTIONS: No  FALLS:  Has patient fallen in last 6 months? No  LIVING ENVIRONMENT: Lives with: lives with their family and lives with their spouse Lives in: House/apartment Stairs: Yes: External: 3 steps; none Has following equipment at home: Single point cane and Walker - 2  wheeled  OCCUPATION: Not working  PLOF: Independent  PATIENT GOALS: No pain, to keep the knee moving  NEXT MD VISIT: 09/24/22  OBJECTIVE:   PATIENT SURVEYS:  FOTO 4  COGNITION: Overall cognitive status: Within functional limits for tasks assessed      EDEMA:  Increased swelling surrounding R knee  POSTURE: rounded shoulders and flexed trunk   PALPATION: Warm and TTP  LOWER EXTREMITY ROM:  Active ROM Right eval Left eval Right 09/24/22 Right 09/26/22 Right 10/02/22  11/01/22 12/09/22  Hip flexion          Hip extension          Hip abduction          Hip adduction          Hip internal rotation          Hip external rotation          Knee flexion  60  75* AROM, 86 PROM 82* AROM seated per pt request  78* self AAROM, 76* AROM  PROM 87 AROM 81 PROM 90 AROM 83 AROM 101 PROM 105  Knee extension -30  -15* AROM seated, -15 PROM supine   -16* supine heel prop with quad set  AROM 25 PROM 16 AROM 14 AROM 5 PROM 0  Ankle dorsiflexion          Ankle plantarflexion          Ankle inversion          Ankle eversion           (Blank rows = not tested)  LOWER EXTREMITY MMT:  MMT Right eval Left eval  Hip flexion 2   Hip extension    Hip abduction    Hip adduction    Hip internal rotation    Hip external rotation    Knee flexion 2   Knee extension 2   Ankle dorsiflexion    Ankle plantarflexion    Ankle inversion    Ankle eversion     (Blank rows = not tested)   FUNCTIONAL TESTS:  5 times sit to stand: unable to do  Timed up and go (TUG): 1 min 28s  GAIT: Distance walked: in clinic distances Assistive device utilized: Environmental consultant - 2 wheeled Level of assistance: SBA Comments: antalgic gait, slowed cadence, step to pattern, decreased step length and time on RLE    TODAY'S TREATMENT:  DATE:  12/13/22 NuStep L5 x49mins LE  Bike full revs x14mins  with leaning  Step ups 6" Lateral step ups 6" Step overs 4" Lateral heel taps 4"  Leg press 20# working on flexion, seat 2 PROM to R knee focusing on flexion  12/11/22 Nustep level 5 x 5 minutes Bike full revolutions with cheating x 5 minutes LEg extension 10# with passive extension LEg press 20# working on flexion PROM really focus on flexion  12/09/22 Gait outside around parking [place] no device good speed Nustep level 5 x 5 minutes Lunge step stretch LEg press working on flexion PROM to the right knee biggest focus on flexion but also did extension  12/06/22 Recheck goals to recert Passive stretching of R knee  NuStep L5 x42mins LE  Bike L1 partial revs x60mins  Step ups 6" Squats to 8" on treadmill 2x10  Leg press 40# 2x10 seat 2  12/04/22 Nustep level 5 x 6 minutes Leg extension 10# 2x10 Leg press able to get to position #2 today with toe even with foot plate top Fast walk and backward walk Passive stretch multiple ways, with her in prone, supine, hanging off bed and in sitting  12/02/22 Nustep level 5 x 6 minutes Walking outside with cane curbs LEg extension 10# with passive stretch b/n sets Leg curls 25# Fast walking forward and then backward walking Leg press 30# working on getting down to position #3 with 3 finger widths toe from the top of the foot plate Stairs step over step 4" and 6" Passive stretch to 102 degrees without much difficulty  11/29/22 Bike partial revs x53mins  Leg ext 10# trying to hit weight 2x10, x5 eccentric lowering, 5# RLE x5 HS curls 35# x10, 25# x10 Leg press level 3 2x5 5s holds  Passive stretching into flexion, leg hooked on to table and lowering it until she can hold x3 with 15s holds   11/27/22 Nustep level 5 x 6 minutes Leg extension 10# working on flexion and trying tpo get back to the bottom Leg press Level 3 toes 3 finger widths from the top of foot plate, 6 reps and holding at the bottom for 5 seconds b/n sets Passive  stretch into flexion to 105 degrees passively  11/25/22 Nustep level 4 x 6 minutes Leg extension 10# working on the flexion at the bottom Leg press 20# working on the flexion down to position #3 and 1 finger width from toe to top of foot plate Passive stretch of the knee working on flexion  11/22/22 Bike partial revs x 6 minutes Resisted gait 20# 4 way x4 PROM contract/relax and stretching  Anterior and lateral step downs 4"  Leg press 40# 2x10 seat 4  Leg press 20# RLE only at seat 6 x10, then seat 5 x10  11/20/22 Bike partial revs x 5 minutes Leg press 20# working at position #4 and trying to get toes down to 3-4 finger widths from the top of the foot plate Butt touches on tmill 6" step and airex Tmill push 4 x 20 seconds Passive stretch, contract relax Got her to 100 degrees flexion passively  11/18/22 Bike partial revs x 5 minutes 20# leg curls 5# leg extension with each working on end ranges and some holding to get more flexion Leg press 20#  with her having a goal to get to the bottom and have pates touch, position # 4 with 3 fingers from top was where we were able to get to today Gait with  sPC, outside negotiating curb, trying to go down with the good leg, a lot of help at first but then able to relax Stairs 4" and 6" up and down working on step over step, needs min A to get to the down with the good leg first Passive stretch focus on flexion  11/15/22 NuStep L5x47mins  Bike partial ranges  Stair training in other building  PROM flexion and extension  5# leg ext RLE 2x10 20# HS curl 2x10 Calf stretch 30s x2 Walking without AD then stepping over obstacles trying to normalize pattern without stopping or thinking about which foot to use  PATIENT EDUCATION:  Education details: POC and HEP Person educated: Patient Education method: Explanation Education comprehension: verbalized understanding  HOME EXERCISE PROGRAM: Access Code:  [id number]  ASSESSMENT:  CLINICAL IMPRESSION:  Patient continues to improve with ROM. Passively can get close to 110 degrees flexion, the end range feels like it is giving more and she is absolutely tolerating much better.  We continued to really push and progress with the flexion. Still compensates with step ups, circumducting RLE and difficulty with eccentric control of quads with step downs.   OBJECTIVE IMPAIRMENTS: Abnormal gait, difficulty walking, decreased ROM, decreased strength, and pain.   ACTIVITY LIMITATIONS: bending, sitting, standing, squatting, sleeping, stairs, transfers, bathing, toileting, and locomotion level  PARTICIPATION LIMITATIONS: meal prep, cleaning, laundry, driving, shopping, and community activity  REHAB POTENTIAL: Good  CLINICAL DECISION MAKING: Stable/uncomplicated  EVALUATION COMPLEXITY: Low   GOALS: Goals reviewed with patient? Yes  SHORT TERM GOALS: Target date: 10/24/22  Patient will be independent with initial HEP. Goal status: 09/20/22 met  2.  Patient will be do TUG < 30s Baseline: 1 min 28s  Goal status: ongoing 10/07/22, 18.57s MET 10/09/22  3.  Patient will be able to do 5 sit to stands  Baseline: unable to do  Goal status: progressing 10/04/22, MET 10/09/22   LONG TERM GOALS: Target date: 01/31/23  Patient will be independent with advanced/ongoing HEP to improve outcomes and carryover.  Goal status: ongoing 11/06/22  2.  Patient will report at least 75% improvement in R knee pain to improve QOL. Baseline: 10/10 Goal status: IN PROGRESS  60% better 11/20/22, 65% 12/06/22  3.  Patient will demonstrate improved R knee AROM to >/= 0-120 deg to allow for normal gait and stair mechanics. Baseline: 30-0-60 10/09/22: 16-0-78  11/01/22: 14-0-83 11/20/22: 10-97 12/06/22: 10-0-100 Goal status: IN PROGRESS  4.  Patient will demonstrate improved functional LE strength as demonstrated by 5/5. Baseline: 2/5 Goal status: MET 3+/5 10/09/22, 5/5  11/01/22   5.  Patient will be able to ambulate 500' with LRAD and normal gait pattern without increased pain to access community.  Baseline: using RW small in home ambulation distances Goal status: progressing w/SPC 12/02/22  6.  Patient will report 37 on FOTO (patient reported outcome measure) to demonstrate improved functional ability. Baseline: 4, 40  Goal status: IN PROGRESS 12/02/22  PLAN:  PT FREQUENCY: 2x/week  PT DURATION: 12 weeks  PLANNED INTERVENTIONS: Therapeutic exercises, Therapeutic activity, Neuromuscular re-education, Balance training, Gait training, Patient/Family education, Self Care, Joint mobilization, Stair training, Electrical stimulation, Cryotherapy, Moist heat, scar mobilization, Vasopneumatic device, and Manual therapy  PLAN FOR NEXT SESSION: We will continue to push her ROM and functional gait and strength  Patient Details  Name: KAMBREA SCHEERER MRN: [id number] Date of Birth: Feb 08, 1960 Referring Provider:  Ollen Gross, MD  Encounter Date: 12/13/2022   Cassie Freer, PT 12/13/2022, 11:49  AM

## 2022-12-13 ENCOUNTER — Ambulatory Visit: Payer: No Typology Code available for payment source

## 2022-12-13 DIAGNOSIS — Z96651 Presence of right artificial knee joint: Secondary | ICD-10-CM

## 2022-12-13 DIAGNOSIS — M25561 Pain in right knee: Secondary | ICD-10-CM

## 2022-12-13 DIAGNOSIS — M6281 Muscle weakness (generalized): Secondary | ICD-10-CM

## 2022-12-13 DIAGNOSIS — R600 Localized edema: Secondary | ICD-10-CM

## 2022-12-13 DIAGNOSIS — M25661 Stiffness of right knee, not elsewhere classified: Secondary | ICD-10-CM

## 2022-12-13 DIAGNOSIS — R2689 Other abnormalities of gait and mobility: Secondary | ICD-10-CM

## 2022-12-16 ENCOUNTER — Ambulatory Visit: Payer: No Typology Code available for payment source

## 2022-12-16 DIAGNOSIS — R2689 Other abnormalities of gait and mobility: Secondary | ICD-10-CM

## 2022-12-16 DIAGNOSIS — M25561 Pain in right knee: Secondary | ICD-10-CM

## 2022-12-16 DIAGNOSIS — M6281 Muscle weakness (generalized): Secondary | ICD-10-CM

## 2022-12-16 DIAGNOSIS — M25661 Stiffness of right knee, not elsewhere classified: Secondary | ICD-10-CM

## 2022-12-16 DIAGNOSIS — R600 Localized edema: Secondary | ICD-10-CM

## 2022-12-16 DIAGNOSIS — Z96651 Presence of right artificial knee joint: Secondary | ICD-10-CM

## 2022-12-16 NOTE — Therapy (Signed)
OUTPATIENT PHYSICAL THERAPY LOWER EXTREMITY TREATMENT    Patient Name: [name] MRN: [id number] DOB:02-09-1960, 62 y.o., female Today's Date: 12/16/2022  END OF SESSION:  PT End of Session - 12/16/22 1702     Visit Number 39    Date for PT Re-Evaluation 01/31/23    Authorization Type [employer]    PT Start Time 1700    PT Stop Time 1745    PT Time Calculation (min) 45 min    Activity Tolerance Patient tolerated treatment well    Behavior During Therapy WFL for tasks assessed/performed                              Past Medical History:  Diagnosis Date   Arthritis    Back pain of lumbar region with sciatica    Cancer (HCC)    breast   Depression    Diabetes mellitus, type II (HCC)    Family history of anesthesia complication    grandfather died under anesthesia 70's- had heart issues   H/O carpal tunnel repair 2014   Headache(784.0)    Hyperlipidemia    Hypertension    Hypothyroidism    Medial meniscus tear    PONV (postoperative nausea and vomiting)    Sleep apnea    cpap since 10   Past Surgical History:  Procedure Laterality Date   BILATERAL TOTAL MASTECTOMY WITH AXILLARY LYMPH NODE DISSECTION     BREAST RECONSTRUCTION     CERVICAL DISC ARTHROPLASTY N/A 03/19/2013   Procedure: CERVICAL FIVE TO SIX, CERVICAL SIX TO SEVEN CERVICAL ANTERIOR DISC ARTHROPLASTY;  Surgeon: Barnett Abu, MD;  Location: [facility];  Service: Neurosurgery;  Laterality: N/A;  C56 C67 artificial disc replacement   CESAREAN SECTION     COLONOSCOPY     DILATATION & CURETTAGE/HYSTEROSCOPY WITH TRUECLEAR N/A 11/25/2013   Procedure: DILATATION & CURETTAGE/HYSTEROSCOPY WITH TRUCLEAR;  Surgeon: Meriel Pica, MD;  Location: [facility];  Service: Gynecology;  Laterality: N/A;   ECTOPIC PREGNANCY SURGERY     GALLBLADDER SURGERY     MOUTH SURGERY     child- dog bite   SPINAL FUSION     2023   STERIOD INJECTION Left 09/09/2022   Procedure: LEFT KNEE STEROID INJECTION;   Surgeon: Ollen Gross, MD;  Location: [facility];  Service: Orthopedics;  Laterality: Left;  left knee injection 0928   TONSILLECTOMY     TOTAL KNEE ARTHROPLASTY Right 09/09/2022   Procedure: TOTAL KNEE ARTHROPLASTY;  Surgeon: Ollen Gross, MD;  Location: [facility];  Service: Orthopedics;  Laterality: Right;   TUBAL LIGATION     Patient Active Problem List   Diagnosis Date Noted   OA (osteoarthritis) of knee 09/09/2022   Exertional dyspnea 04/17/2022   Elevated coronary artery calcium score 04/17/2022   Spondylolisthesis at L4-L5 level 12/27/2021   Ductal carcinoma in situ (DCIS) of left breast 01/19/2018   Generalized anxiety disorder 11/07/2017   Depression 11/07/2017   Insomnia 11/07/2017   Cervical spondylosis 03/19/2013    PCP: Creola Corn  REFERRING PROVIDER: Trudee Grip  REFERRING DIAG: R TKA 09/09/22  THERAPY DIAG:  S/P total knee arthroplasty, right  Acute pain of right knee  Stiffness of right knee, not elsewhere classified  Muscle weakness (generalized)  Localized edema  Other abnormalities of gait and mobility  Rationale for Evaluation and Treatment: Rehabilitation  ONSET DATE: 09/09/22  SUBJECTIVE:   SUBJECTIVE STATEMENT :  Knee feels good but tight. I  went Christmas shopping this weekend and did a lot of walking.   PERTINENT HISTORY: R TKA 8/19 L knee steroid injection 09/09/22  PAIN:  Are you having pain? Yes: NPRS scale: 4/10 Pain location: R knee Pain description: sharp medial R knee  Aggravating factors: weight bearing on the RLE, bending, moving it  Relieving factors: ice helps some, pain meds  PRECAUTIONS: None  RED FLAGS: None   WEIGHT BEARING RESTRICTIONS: No  FALLS:  Has patient fallen in last 6 months? No  LIVING ENVIRONMENT: Lives with: lives with their family and lives with their spouse Lives in: House/apartment Stairs: Yes: External: 3 steps; none Has following equipment at home: Single point cane and Walker - 2  wheeled  OCCUPATION: Not working  PLOF: Independent  PATIENT GOALS: No pain, to keep the knee moving  NEXT MD VISIT: 09/24/22  OBJECTIVE:   PATIENT SURVEYS:  FOTO 4  COGNITION: Overall cognitive status: Within functional limits for tasks assessed      EDEMA:  Increased swelling surrounding R knee  POSTURE: rounded shoulders and flexed trunk   PALPATION: Warm and TTP  LOWER EXTREMITY ROM:  Active ROM Right eval Left eval Right 09/24/22 Right 09/26/22 Right 10/02/22  11/01/22 12/09/22  Hip flexion          Hip extension          Hip abduction          Hip adduction          Hip internal rotation          Hip external rotation          Knee flexion  60  75* AROM, 86 PROM 82* AROM seated per pt request  78* self AAROM, 76* AROM  PROM 87 AROM 81 PROM 90 AROM 83 AROM 101 PROM 105  Knee extension -30  -15* AROM seated, -15 PROM supine   -16* supine heel prop with quad set  AROM 25 PROM 16 AROM 14 AROM 5 PROM 0  Ankle dorsiflexion          Ankle plantarflexion          Ankle inversion          Ankle eversion           (Blank rows = not tested)  LOWER EXTREMITY MMT:  MMT Right eval Left eval  Hip flexion 2   Hip extension    Hip abduction    Hip adduction    Hip internal rotation    Hip external rotation    Knee flexion 2   Knee extension 2   Ankle dorsiflexion    Ankle plantarflexion    Ankle inversion    Ankle eversion     (Blank rows = not tested)   FUNCTIONAL TESTS:  5 times sit to stand: unable to do  Timed up and go (TUG): 1 min 28s  GAIT: Distance walked: in clinic distances Assistive device utilized: Environmental consultant - 2 wheeled Level of assistance: SBA Comments: antalgic gait, slowed cadence, step to pattern, decreased step length and time on RLE    TODAY'S TREATMENT:  DATE:  12/16/22 NuStep L5x94mins  Bike L3 x86mins  Resisted  gait 20# 4 way x4  Calf stretch 15s x2  Calf raises 2x12 Leg ext 10# 2x10, RLE 5# x10 HS curls 25# 2x10, RLE 15# x10    12/13/22 NuStep L5 x58mins LE  Bike full revs x51mins with leaning  Step ups 6" Lateral step ups 6" Step overs 4" Lateral heel taps 4"  Leg press 20# working on flexion, seat 2 PROM to R knee focusing on flexion  12/11/22 Nustep level 5 x 5 minutes Bike full revolutions with cheating x 5 minutes LEg extension 10# with passive extension LEg press 20# working on flexion PROM really focus on flexion  12/09/22 Gait outside around parking [place] no device good speed Nustep level 5 x 5 minutes Lunge step stretch LEg press working on flexion PROM to the right knee biggest focus on flexion but also did extension  12/06/22 Recheck goals to recert Passive stretching of R knee  NuStep L5 x35mins LE  Bike L1 partial revs x73mins  Step ups 6" Squats to 8" on treadmill 2x10  Leg press 40# 2x10 seat 2  12/04/22 Nustep level 5 x 6 minutes Leg extension 10# 2x10 Leg press able to get to position #2 today with toe even with foot plate top Fast walk and backward walk Passive stretch multiple ways, with her in prone, supine, hanging off bed and in sitting  12/02/22 Nustep level 5 x 6 minutes Walking outside with cane curbs LEg extension 10# with passive stretch b/n sets Leg curls 25# Fast walking forward and then backward walking Leg press 30# working on getting down to position #3 with 3 finger widths toe from the top of the foot plate Stairs step over step 4" and 6" Passive stretch to 102 degrees without much difficulty  11/29/22 Bike partial revs x40mins  Leg ext 10# trying to hit weight 2x10, x5 eccentric lowering, 5# RLE x5 HS curls 35# x10, 25# x10 Leg press level 3 2x5 5s holds  Passive stretching into flexion, leg hooked on to table and lowering it until she can hold x3 with 15s holds   11/27/22 Nustep level 5 x 6 minutes Leg extension 10# working on  flexion and trying tpo get back to the bottom Leg press Level 3 toes 3 finger widths from the top of foot plate, 6 reps and holding at the bottom for 5 seconds b/n sets Passive stretch into flexion to 105 degrees passively  11/25/22 Nustep level 4 x 6 minutes Leg extension 10# working on the flexion at the bottom Leg press 20# working on the flexion down to position #3 and 1 finger width from toe to top of foot plate Passive stretch of the knee working on flexion  11/22/22 Bike partial revs x 6 minutes Resisted gait 20# 4 way x4 PROM contract/relax and stretching  Anterior and lateral step downs 4"  Leg press 40# 2x10 seat 4  Leg press 20# RLE only at seat 6 x10, then seat 5 x10  11/20/22 Bike partial revs x 5 minutes Leg press 20# working at position #4 and trying to get toes down to 3-4 finger widths from the top of the foot plate Butt touches on tmill 6" step and airex Tmill push 4 x 20 seconds Passive stretch, contract relax Got her to 100 degrees flexion passively  11/18/22 Bike partial revs x 5 minutes 20# leg curls 5# leg extension with each working on end ranges and some holding to  get more flexion Leg press 20#  with her having a goal to get to the bottom and have pates touch, position # 4 with 3 fingers from top was where we were able to get to today Gait with sPC, outside negotiating curb, trying to go down with the good leg, a lot of help at first but then able to relax Stairs 4" and 6" up and down working on step over step, needs min A to get to the down with the good leg first Passive stretch focus on flexion  11/15/22 NuStep L5x1mins  Bike partial ranges  Stair training in other building  PROM flexion and extension  5# leg ext RLE 2x10 20# HS curl 2x10 Calf stretch 30s x2 Walking without AD then stepping over obstacles trying to normalize pattern without stopping or thinking about which foot to use  PATIENT EDUCATION:  Education details: POC and  HEP Person educated: Patient Education method: Explanation Education comprehension: verbalized understanding  HOME EXERCISE PROGRAM: Access Code: [id number]  ASSESSMENT:  CLINICAL IMPRESSION:  Patient continues to improve with ROM, strength, and gait. We continued to really push and progress with the flexion. We did mostly strengthening today and isolated strengthening with RLE.   OBJECTIVE IMPAIRMENTS: Abnormal gait, difficulty walking, decreased ROM, decreased strength, and pain.   ACTIVITY LIMITATIONS: bending, sitting, standing, squatting, sleeping, stairs, transfers, bathing, toileting, and locomotion level  PARTICIPATION LIMITATIONS: meal prep, cleaning, laundry, driving, shopping, and community activity  REHAB POTENTIAL: Good  CLINICAL DECISION MAKING: Stable/uncomplicated  EVALUATION COMPLEXITY: Low   GOALS: Goals reviewed with patient? Yes  SHORT TERM GOALS: Target date: 10/24/22  Patient will be independent with initial HEP. Goal status: 09/20/22 met  2.  Patient will be do TUG < 30s Baseline: 1 min 28s  Goal status: ongoing 10/07/22, 18.57s MET 10/09/22  3.  Patient will be able to do 5 sit to stands  Baseline: unable to do  Goal status: progressing 10/04/22, MET 10/09/22   LONG TERM GOALS: Target date: 01/31/23  Patient will be independent with advanced/ongoing HEP to improve outcomes and carryover.  Goal status: ongoing 11/06/22  2.  Patient will report at least 75% improvement in R knee pain to improve QOL. Baseline: 10/10 Goal status: IN PROGRESS  60% better 11/20/22, 65% 12/06/22  3.  Patient will demonstrate improved R knee AROM to >/= 0-120 deg to allow for normal gait and stair mechanics. Baseline: 30-0-60 10/09/22: 16-0-78  11/01/22: 14-0-83 11/20/22: 10-97 12/06/22: 10-0-100 Goal status: IN PROGRESS  4.  Patient will demonstrate improved functional LE strength as demonstrated by 5/5. Baseline: 2/5 Goal status: MET 3+/5 10/09/22, 5/5 11/01/22    5.  Patient will be able to ambulate 500' with LRAD and normal gait pattern without increased pain to access community.  Baseline: using RW small in home ambulation distances Goal status: progressing w/SPC 12/02/22  6.  Patient will report 72 on FOTO (patient reported outcome measure) to demonstrate improved functional ability. Baseline: 4, 40  Goal status: IN PROGRESS 12/02/22  PLAN:  PT FREQUENCY: 2x/week  PT DURATION: 12 weeks  PLANNED INTERVENTIONS: Therapeutic exercises, Therapeutic activity, Neuromuscular re-education, Balance training, Gait training, Patient/Family education, Self Care, Joint mobilization, Stair training, Electrical stimulation, Cryotherapy, Moist heat, scar mobilization, Vasopneumatic device, and Manual therapy  PLAN FOR NEXT SESSION: We will continue to push her ROM and functional gait and strength, 40th visit progress note   Patient Details  Name: MICKELL VANDEWALLE MRN: [id number] Date of Birth: [birth year] Referring Provider:  Ollen Gross, MD  Encounter Date: 12/16/2022   Cassie Freer, PT 12/16/2022, 5:45 PM

## 2022-12-17 ENCOUNTER — Encounter: Payer: Self-pay | Admitting: Professional Counselor

## 2022-12-17 ENCOUNTER — Ambulatory Visit: Payer: No Typology Code available for payment source

## 2022-12-17 ENCOUNTER — Ambulatory Visit (INDEPENDENT_AMBULATORY_CARE_PROVIDER_SITE_OTHER): Payer: No Typology Code available for payment source | Admitting: Professional Counselor

## 2022-12-17 DIAGNOSIS — M25561 Pain in right knee: Secondary | ICD-10-CM

## 2022-12-17 DIAGNOSIS — F411 Generalized anxiety disorder: Secondary | ICD-10-CM

## 2022-12-17 DIAGNOSIS — M6281 Muscle weakness (generalized): Secondary | ICD-10-CM

## 2022-12-17 DIAGNOSIS — R600 Localized edema: Secondary | ICD-10-CM

## 2022-12-17 DIAGNOSIS — Z96651 Presence of right artificial knee joint: Secondary | ICD-10-CM

## 2022-12-17 DIAGNOSIS — M25661 Stiffness of right knee, not elsewhere classified: Secondary | ICD-10-CM

## 2022-12-17 DIAGNOSIS — R2689 Other abnormalities of gait and mobility: Secondary | ICD-10-CM

## 2022-12-17 NOTE — Therapy (Addendum)
OUTPATIENT PHYSICAL THERAPY LOWER EXTREMITY TREATMENT Progress Note Reporting Period 11/25/22 to 12/17/22  See note below for Objective Data and Assessment of Progress/Goals.       Patient Name: [name] MRN: [id number] DOB:1960-05-21, 62 y.o., female Today's Date: 12/17/2022  END OF SESSION:  PT End of Session - 12/17/22 1718     Visit Number 40    Date for PT Re-Evaluation 01/31/23    Authorization Type [employer]    PT Start Time 1715    PT Stop Time 1800    PT Time Calculation (min) 45 min    Activity Tolerance Patient tolerated treatment well    Behavior During Therapy WFL for tasks assessed/performed                               Past Medical History:  Diagnosis Date   Arthritis    Back pain of lumbar region with sciatica    Cancer (HCC)    breast   Depression    Diabetes mellitus, type II (HCC)    Family history of anesthesia complication    grandfather died under anesthesia 70's- had heart issues   H/O carpal tunnel repair 2014   Headache(784.0)    Hyperlipidemia    Hypertension    Hypothyroidism    Medial meniscus tear    PONV (postoperative nausea and vomiting)    Sleep apnea    cpap since 10   Past Surgical History:  Procedure Laterality Date   BILATERAL TOTAL MASTECTOMY WITH AXILLARY LYMPH NODE DISSECTION     BREAST RECONSTRUCTION     CERVICAL DISC ARTHROPLASTY N/A 03/19/2013   Procedure: CERVICAL FIVE TO SIX, CERVICAL SIX TO SEVEN CERVICAL ANTERIOR DISC ARTHROPLASTY;  Surgeon: Barnett Abu, MD;  Location: [facility];  Service: Neurosurgery;  Laterality: N/A;  C56 C67 artificial disc replacement   CESAREAN SECTION     COLONOSCOPY     DILATATION & CURETTAGE/HYSTEROSCOPY WITH TRUECLEAR N/A 11/25/2013   Procedure: DILATATION & CURETTAGE/HYSTEROSCOPY WITH TRUCLEAR;  Surgeon: Meriel Pica, MD;  Location: [facility];  Service: Gynecology;  Laterality: N/A;   ECTOPIC PREGNANCY SURGERY     GALLBLADDER SURGERY     MOUTH SURGERY      child- dog bite   SPINAL FUSION     2023   STERIOD INJECTION Left 09/09/2022   Procedure: LEFT KNEE STEROID INJECTION;  Surgeon: Ollen Gross, MD;  Location: [facility];  Service: Orthopedics;  Laterality: Left;  left knee injection 0928   TONSILLECTOMY     TOTAL KNEE ARTHROPLASTY Right 09/09/2022   Procedure: TOTAL KNEE ARTHROPLASTY;  Surgeon: Ollen Gross, MD;  Location: [facility];  Service: Orthopedics;  Laterality: Right;   TUBAL LIGATION     Patient Active Problem List   Diagnosis Date Noted   OA (osteoarthritis) of knee 09/09/2022   Exertional dyspnea 04/17/2022   Elevated coronary artery calcium score 04/17/2022   Spondylolisthesis at L4-L5 level 12/27/2021   Ductal carcinoma in situ (DCIS) of left breast 01/19/2018   Generalized anxiety disorder 11/07/2017   Depression 11/07/2017   Insomnia 11/07/2017   Cervical spondylosis 03/19/2013    PCP: Creola Corn  REFERRING PROVIDER: Trudee Grip  REFERRING DIAG: R TKA 09/09/22  THERAPY DIAG:  S/P total knee arthroplasty, right  Acute pain of right knee  Stiffness of right knee, not elsewhere classified  Muscle weakness (generalized)  Localized edema  Other abnormalities of gait and mobility  Rationale for  Evaluation and Treatment: Rehabilitation  ONSET DATE: 09/09/22  SUBJECTIVE:   SUBJECTIVE STATEMENT :  I did a lot today down at [employer]   PERTINENT HISTORY: R TKA 8/19 L knee steroid injection 09/09/22  PAIN:  Are you having pain? Yes: NPRS scale: 4/10 Pain location: R knee Pain description: sharp medial R knee  Aggravating factors: weight bearing on the RLE, bending, moving it  Relieving factors: ice helps some, pain meds  PRECAUTIONS: None  RED FLAGS: None   WEIGHT BEARING RESTRICTIONS: No  FALLS:  Has patient fallen in last 6 months? No  LIVING ENVIRONMENT: Lives with: lives with their family and lives with their spouse Lives in: House/apartment Stairs: Yes: External: 3 steps; none Has  following equipment at home: Single point cane and Walker - 2 wheeled  OCCUPATION: Not working  PLOF: Independent  PATIENT GOALS: No pain, to keep the knee moving  NEXT MD VISIT: 09/24/22  OBJECTIVE:   PATIENT SURVEYS:  FOTO 4  COGNITION: Overall cognitive status: Within functional limits for tasks assessed      EDEMA:  Increased swelling surrounding R knee  POSTURE: rounded shoulders and flexed trunk   PALPATION: Warm and TTP  LOWER EXTREMITY ROM:  Active ROM Right eval Left eval Right 09/24/22 Right 09/26/22 Right 10/02/22  11/01/22 12/09/22  Hip flexion          Hip extension          Hip abduction          Hip adduction          Hip internal rotation          Hip external rotation          Knee flexion  60  75* AROM, 86 PROM 82* AROM seated per pt request  78* self AAROM, 76* AROM  PROM 87 AROM 81 PROM 90 AROM 83 AROM 101 PROM 105  Knee extension -30  -15* AROM seated, -15 PROM supine   -16* supine heel prop with quad set  AROM 25 PROM 16 AROM 14 AROM 5 PROM 0  Ankle dorsiflexion          Ankle plantarflexion          Ankle inversion          Ankle eversion           (Blank rows = not tested)  LOWER EXTREMITY MMT:  MMT Right eval Left eval  Hip flexion 2   Hip extension    Hip abduction    Hip adduction    Hip internal rotation    Hip external rotation    Knee flexion 2   Knee extension 2   Ankle dorsiflexion    Ankle plantarflexion    Ankle inversion    Ankle eversion     (Blank rows = not tested)   FUNCTIONAL TESTS:  5 times sit to stand: unable to do  Timed up and go (TUG): 1 min 28s  GAIT: Distance walked: in clinic distances Assistive device utilized: Environmental consultant - 2 wheeled Level of assistance: SBA Comments: antalgic gait, slowed cadence, step to pattern, decreased step length and time on RLE    TODAY'S TREATMENT:  DATE:  12/17/22 NuStep L5x4mins  Progress note- recheck ROM PROM working on knee flexion Supine hip flexor stretch 30s x2 Prone quad stretch 30s x2  Leg press 20# TLE 3x10 seat 4,3,2 Calf raises on leg press 20# 2x12  12/16/22 NuStep L5x52mins  Bike L3 x78mins  Resisted gait 20# 4 way x4  Calf stretch 15s x2  Calf raises 2x12 Leg ext 10# 2x10, RLE 5# x10 HS curls 25# 2x10, RLE 15# x10    12/13/22 NuStep L5 x67mins LE  Bike full revs x48mins with leaning  Step ups 6" Lateral step ups 6" Step overs 4" Lateral heel taps 4"  Leg press 20# working on flexion, seat 2 PROM to R knee focusing on flexion  12/11/22 Nustep level 5 x 5 minutes Bike full revolutions with cheating x 5 minutes LEg extension 10# with passive extension LEg press 20# working on flexion PROM really focus on flexion  12/09/22 Gait outside around parking [place] no device good speed Nustep level 5 x 5 minutes Lunge step stretch LEg press working on flexion PROM to the right knee biggest focus on flexion but also did extension  12/06/22 Recheck goals to recert Passive stretching of R knee  NuStep L5 x17mins LE  Bike L1 partial revs x76mins  Step ups 6" Squats to 8" on treadmill 2x10  Leg press 40# 2x10 seat 2  12/04/22 Nustep level 5 x 6 minutes Leg extension 10# 2x10 Leg press able to get to position #2 today with toe even with foot plate top Fast walk and backward walk Passive stretch multiple ways, with her in prone, supine, hanging off bed and in sitting  12/02/22 Nustep level 5 x 6 minutes Walking outside with cane curbs LEg extension 10# with passive stretch b/n sets Leg curls 25# Fast walking forward and then backward walking Leg press 30# working on getting down to position #3 with 3 finger widths toe from the top of the foot plate Stairs step over step 4" and 6" Passive stretch to 102 degrees without much difficulty  11/29/22 Bike partial revs x32mins  Leg ext 10# trying to hit  weight 2x10, x5 eccentric lowering, 5# RLE x5 HS curls 35# x10, 25# x10 Leg press level 3 2x5 5s holds  Passive stretching into flexion, leg hooked on to table and lowering it until she can hold x3 with 15s holds   11/27/22 Nustep level 5 x 6 minutes Leg extension 10# working on flexion and trying tpo get back to the bottom Leg press Level 3 toes 3 finger widths from the top of foot plate, 6 reps and holding at the bottom for 5 seconds b/n sets Passive stretch into flexion to 105 degrees passively  11/25/22 Nustep level 4 x 6 minutes Leg extension 10# working on the flexion at the bottom Leg press 20# working on the flexion down to position #3 and 1 finger width from toe to top of foot plate Passive stretch of the knee working on flexion  11/22/22 Bike partial revs x 6 minutes Resisted gait 20# 4 way x4 PROM contract/relax and stretching  Anterior and lateral step downs 4"  Leg press 40# 2x10 seat 4  Leg press 20# RLE only at seat 6 x10, then seat 5 x10  11/20/22 Bike partial revs x 5 minutes Leg press 20# working at position #4 and trying to get toes down to 3-4 finger widths from the top of the foot plate Butt touches on tmill 6" step and airex Tmill push  4 x 20 seconds Passive stretch, contract relax Got her to 100 degrees flexion passively  11/18/22 Bike partial revs x 5 minutes 20# leg curls 5# leg extension with each working on end ranges and some holding to get more flexion Leg press 20#  with her having a goal to get to the bottom and have pates touch, position # 4 with 3 fingers from top was where we were able to get to today Gait with sPC, outside negotiating curb, trying to go down with the good leg, a lot of help at first but then able to relax Stairs 4" and 6" up and down working on step over step, needs min A to get to the down with the good leg first Passive stretch focus on flexion  11/15/22 NuStep L5x24mins  Bike partial ranges  Stair training in other  building  PROM flexion and extension  5# leg ext RLE 2x10 20# HS curl 2x10 Calf stretch 30s x2 Walking without AD then stepping over obstacles trying to normalize pattern without stopping or thinking about which foot to use  PATIENT EDUCATION:  Education details: POC and HEP Person educated: Patient Education method: Explanation Education comprehension: verbalized understanding  HOME EXERCISE PROGRAM: Access Code: [id number]  ASSESSMENT:  CLINICAL IMPRESSION:  Patient continues to improve with ROM, strength, and gait. Still slowly progressing towards her goals for ROM and gait. Was able to get ~110 with PROM. We did mostly stretching today and some strengthening on the leg press.   OBJECTIVE IMPAIRMENTS: Abnormal gait, difficulty walking, decreased ROM, decreased strength, and pain.   ACTIVITY LIMITATIONS: bending, sitting, standing, squatting, sleeping, stairs, transfers, bathing, toileting, and locomotion level  PARTICIPATION LIMITATIONS: meal prep, cleaning, laundry, driving, shopping, and community activity  REHAB POTENTIAL: Good  CLINICAL DECISION MAKING: Stable/uncomplicated  EVALUATION COMPLEXITY: Low   GOALS: Goals reviewed with patient? Yes  SHORT TERM GOALS: Target date: 10/24/22  Patient will be independent with initial HEP. Goal status: 09/20/22 met  2.  Patient will be do TUG < 30s Baseline: 1 min 28s  Goal status: ongoing 10/07/22, 18.57s MET 10/09/22  3.  Patient will be able to do 5 sit to stands  Baseline: unable to do  Goal status: progressing 10/04/22, MET 10/09/22   LONG TERM GOALS: Target date: 01/31/23  Patient will be independent with advanced/ongoing HEP to improve outcomes and carryover.  Goal status: ongoing 11/06/22  2.  Patient will report at least 75% improvement in R knee pain to improve QOL. Baseline: 10/10 Goal status: IN PROGRESS  60% better 11/20/22, 65% 12/06/22, 70% 12/17/22  3.  Patient will demonstrate improved R knee AROM to  >/= 0-120 deg to allow for normal gait and stair mechanics. Baseline: 30-0-60 10/09/22: 16-0-78  11/01/22: 14-0-83 11/20/22: 10-97 12/06/22: 10-0-100 12/17/22: 8-0-104 Goal status: IN PROGRESS  4.  Patient will demonstrate improved functional LE strength as demonstrated by 5/5. Baseline: 2/5 Goal status: MET 3+/5 10/09/22, 5/5 11/01/22   5.  Patient will be able to ambulate 500' with LRAD and normal gait pattern without increased pain to access community.  Baseline: using RW small in home ambulation distances Goal status: progressing w/SPC 12/02/22, doing some walking without AD   6.  Patient will report 46 on FOTO (patient reported outcome measure) to demonstrate improved functional ability. Baseline: 4, 40-11/11/24 Goal status: MET 53 12/17/22  PLAN:  PT FREQUENCY: 2x/week  PT DURATION: 12 weeks  PLANNED INTERVENTIONS: Therapeutic exercises, Therapeutic activity, Neuromuscular re-education, Balance training, Gait training, Patient/Family  education, Self Care, Joint mobilization, Stair training, Electrical stimulation, Cryotherapy, Moist heat, scar mobilization, Vasopneumatic device, and Manual therapy  PLAN FOR NEXT SESSION: We will continue to push her ROM and functional gait and strength, 40th visit progress note   Patient Details  Name: YAQUELYN CALLICUTT MRN: [id number] Date of Birth: October 04, 1960 Referring Provider:  Ollen Gross, MD  Encounter Date: 12/17/2022   Cassie Freer, PT 12/17/2022, 5:57 PM

## 2022-12-17 NOTE — Progress Notes (Signed)
      [facility] Counselor/Therapist Progress Note  Patient ID: [name], MRN: [id number],    Date: 12/17/2022  Time Spent: 3:15 PM to 4:16 PM  Treatment Type: Family with patient  Pt presented to session with spouse.  Reported Symptoms: Worries, restlessness, preoccupying thoughts, indecisiveness, stress, interpersonal conflict  Mental Status Exam:  Appearance:   Neat     Behavior:  Appropriate and Sharing  Motor:  Restlestness  Speech/Language:   Clear and Coherent and Normal Rate  Affect:  Appropriate and Congruent  Mood:  anxious and irritable  Thought process:  normal  Thought content:    WNL  Sensory/Perceptual disturbances:    WNL  Orientation:  Sound  Attention:  Good  Concentration:  Good  Memory:  WNL  Fund of knowledge:   Good  Insight:    Good  Judgment:   Good  Impulse Control:  Good   Risk Assessment: Danger to Self:  No Self-injurious Behavior: No Danger to Others: No Duty to Warn:no Physical Aggression / Violence:No  Access to Firearms a concern: No  Gang Involvement:No   Subjective: Patient presented to session with spouse to address concerns of anxiety.  Patient reported limited progress at this time.  Patient reported having challenging several days with spouse due to argument that continued.  Patient and spouse processed their experience of the argument and its impact.  Counselor established ground rules for session including slow pacing, limiting interrupting, and lowering volume.  Patient voiced her needs and desires in relationship, as did spouse.  Counselor helped couple facilitate insight into short-term goals, including: Establish nonnegotiables such as not bringing family of origin members into arguments to limit her hurtfulness, work to limit references to the past and or to one another's frame of mind or state of mental health, make attempts to shelf concerns and self sooth, and spouse agreed to seek own counseling for stage of change  around lifestyle choices per joint concern and own intrapersonal needs.  Counselor patient and spouse discussed emotional dysregulation skills and how growing capacity for these could help with escalation in marital strife cycle.  Patient voiced helpfulness of session and resources in mitigating anxious symptoms.  Interventions: Solution-Oriented/Positive Psychology, Humanistic/Existential, Insight-Oriented, and Family Systems  Diagnosis:   ICD-10-CM   1. Generalized anxiety disorder  F41.1       Plan: Patient scheduled for follow-up; continue process work and developing coping skills.  Patient identified short-term goals for between sessions as described above.  Counselor to share fair fighting rules and other healthy relationship resources upon next session.  Progress note was dictated via Dragon and checked for accuracy.  Gaspar Bidding, [facility]

## 2022-12-18 ENCOUNTER — Ambulatory Visit: Payer: No Typology Code available for payment source

## 2022-12-21 ENCOUNTER — Other Ambulatory Visit: Payer: Self-pay | Admitting: Psychiatry

## 2022-12-21 DIAGNOSIS — F4310 Post-traumatic stress disorder, unspecified: Secondary | ICD-10-CM

## 2022-12-21 DIAGNOSIS — F411 Generalized anxiety disorder: Secondary | ICD-10-CM

## 2022-12-23 ENCOUNTER — Encounter: Payer: Self-pay | Admitting: Physical Therapy

## 2022-12-23 ENCOUNTER — Ambulatory Visit: Payer: No Typology Code available for payment source | Attending: Orthopedic Surgery | Admitting: Physical Therapy

## 2022-12-23 DIAGNOSIS — Z96651 Presence of right artificial knee joint: Secondary | ICD-10-CM | POA: Insufficient documentation

## 2022-12-23 DIAGNOSIS — M25561 Pain in right knee: Secondary | ICD-10-CM | POA: Insufficient documentation

## 2022-12-23 DIAGNOSIS — M6281 Muscle weakness (generalized): Secondary | ICD-10-CM | POA: Diagnosis present

## 2022-12-23 DIAGNOSIS — R2689 Other abnormalities of gait and mobility: Secondary | ICD-10-CM | POA: Insufficient documentation

## 2022-12-23 DIAGNOSIS — M25661 Stiffness of right knee, not elsewhere classified: Secondary | ICD-10-CM | POA: Insufficient documentation

## 2022-12-23 DIAGNOSIS — R600 Localized edema: Secondary | ICD-10-CM | POA: Insufficient documentation

## 2022-12-23 NOTE — Therapy (Signed)
OUTPATIENT PHYSICAL THERAPY LOWER EXTREMITY TREATMENT     Patient Name: [name] MRN: [id number] DOB:07-12-1960, 62 y.o., female Today's Date: 12/23/2022  END OF SESSION:  PT End of Session - 12/23/22 1703     Visit Number 41    Date for PT Re-Evaluation 01/31/23    Authorization Type [employer]    PT Start Time 1659    PT Stop Time 1745    PT Time Calculation (min) 46 min    Activity Tolerance Patient tolerated treatment well    Behavior During Therapy WFL for tasks assessed/performed             Past Medical History:  Diagnosis Date   Arthritis    Back pain of lumbar region with sciatica    Cancer (HCC)    breast   Depression    Diabetes mellitus, type II (HCC)    Family history of anesthesia complication    grandfather died under anesthesia 70's- had heart issues   H/O carpal tunnel repair 2014   Headache(784.0)    Hyperlipidemia    Hypertension    Hypothyroidism    Medial meniscus tear    PONV (postoperative nausea and vomiting)    Sleep apnea    cpap since 10   Past Surgical History:  Procedure Laterality Date   BILATERAL TOTAL MASTECTOMY WITH AXILLARY LYMPH NODE DISSECTION     BREAST RECONSTRUCTION     CERVICAL DISC ARTHROPLASTY N/A 03/19/2013   Procedure: CERVICAL FIVE TO SIX, CERVICAL SIX TO SEVEN CERVICAL ANTERIOR DISC ARTHROPLASTY;  Surgeon: Barnett Abu, MD;  Location: [facility];  Service: Neurosurgery;  Laterality: N/A;  C56 C67 artificial disc replacement   CESAREAN SECTION     COLONOSCOPY     DILATATION & CURETTAGE/HYSTEROSCOPY WITH TRUECLEAR N/A 11/25/2013   Procedure: DILATATION & CURETTAGE/HYSTEROSCOPY WITH TRUCLEAR;  Surgeon: Meriel Pica, MD;  Location: [facility];  Service: Gynecology;  Laterality: N/A;   ECTOPIC PREGNANCY SURGERY     GALLBLADDER SURGERY     MOUTH SURGERY     child- dog bite   SPINAL FUSION     2023   STERIOD INJECTION Left 09/09/2022   Procedure: LEFT KNEE STEROID INJECTION;  Surgeon: Ollen Gross, MD;   Location: [facility];  Service: Orthopedics;  Laterality: Left;  left knee injection 0928   TONSILLECTOMY     TOTAL KNEE ARTHROPLASTY Right 09/09/2022   Procedure: TOTAL KNEE ARTHROPLASTY;  Surgeon: Ollen Gross, MD;  Location: [facility];  Service: Orthopedics;  Laterality: Right;   TUBAL LIGATION     Patient Active Problem List   Diagnosis Date Noted   OA (osteoarthritis) of knee 09/09/2022   Exertional dyspnea 04/17/2022   Elevated coronary artery calcium score 04/17/2022   Spondylolisthesis at L4-L5 level 12/27/2021   Ductal carcinoma in situ (DCIS) of left breast 01/19/2018   Generalized anxiety disorder 11/07/2017   Depression 11/07/2017   Insomnia 11/07/2017   Cervical spondylosis 03/19/2013    PCP: Creola Corn  REFERRING PROVIDER: Trudee Grip  REFERRING DIAG: R TKA 09/09/22  THERAPY DIAG:  S/P total knee arthroplasty, right  Acute pain of right knee  Stiffness of right knee, not elsewhere classified  Muscle weakness (generalized)  Localized edema  Rationale for Evaluation and Treatment: Rehabilitation  ONSET DATE: 09/09/22  SUBJECTIVE:   SUBJECTIVE STATEMENT :  I am doing okay, a little stiff  PERTINENT HISTORY: R TKA 8/19 L knee steroid injection 09/09/22  PAIN:  Are you having pain? Yes: NPRS scale: 4/10  Pain location: R knee Pain description: sharp medial R knee  Aggravating factors: weight bearing on the RLE, bending, moving it  Relieving factors: ice helps some, pain meds  PRECAUTIONS: None  RED FLAGS: None   WEIGHT BEARING RESTRICTIONS: No  FALLS:  Has patient fallen in last 6 months? No  LIVING ENVIRONMENT: Lives with: lives with their family and lives with their spouse Lives in: House/apartment Stairs: Yes: External: 3 steps; none Has following equipment at home: Single point cane and Walker - 2 wheeled  OCCUPATION: Not working  PLOF: Independent  PATIENT GOALS: No pain, to keep the knee moving  NEXT MD VISIT: 09/24/22  OBJECTIVE:    PATIENT SURVEYS:  FOTO 4  COGNITION: Overall cognitive status: Within functional limits for tasks assessed      EDEMA:  Increased swelling surrounding R knee  POSTURE: rounded shoulders and flexed trunk   PALPATION: Warm and TTP  LOWER EXTREMITY ROM:  Active ROM Right eval Left eval Right 09/24/22 Right 09/26/22 Right 10/02/22  11/01/22 12/09/22  Hip flexion          Hip extension          Hip abduction          Hip adduction          Hip internal rotation          Hip external rotation          Knee flexion  60  75* AROM, 86 PROM 82* AROM seated per pt request  78* self AAROM, 76* AROM  PROM 87 AROM 81 PROM 90 AROM 83 AROM 101 PROM 105  Knee extension -30  -15* AROM seated, -15 PROM supine   -16* supine heel prop with quad set  AROM 25 PROM 16 AROM 14 AROM 5 PROM 0  Ankle dorsiflexion          Ankle plantarflexion          Ankle inversion          Ankle eversion           (Blank rows = not tested)  LOWER EXTREMITY MMT:  MMT Right eval Left eval  Hip flexion 2   Hip extension    Hip abduction    Hip adduction    Hip internal rotation    Hip external rotation    Knee flexion 2   Knee extension 2   Ankle dorsiflexion    Ankle plantarflexion    Ankle inversion    Ankle eversion     (Blank rows = not tested)   FUNCTIONAL TESTS:  5 times sit to stand: unable to do  Timed up and go (TUG): 1 min 28s  GAIT: Distance walked: in clinic distances Assistive device utilized: Environmental consultant - 2 wheeled Level of assistance: SBA Comments: antalgic gait, slowed cadence, step to pattern, decreased step length and time on RLE    TODAY'S TREATMENT:  DATE:  12/23/22 Nustep level 5 x 5 minutes 8" lunge stretch Bike partial and full revolutions Leg press 20# working on flexion Fast walking Backward walking Passive stretch to 109 degrees  flexion  12/17/22 NuStep L5x93mins  Progress note- recheck ROM PROM working on knee flexion Supine hip flexor stretch 30s x2 Prone quad stretch 30s x2  Leg press 20# TLE 3x10 seat 4,3,2 Calf raises on leg press 20# 2x12  12/16/22 NuStep L5x77mins  Bike L3 x74mins  Resisted gait 20# 4 way x4  Calf stretch 15s x2  Calf raises 2x12 Leg ext 10# 2x10, RLE 5# x10 HS curls 25# 2x10, RLE 15# x10    12/13/22 NuStep L5 x3mins LE  Bike full revs x61mins with leaning  Step ups 6" Lateral step ups 6" Step overs 4" Lateral heel taps 4"  Leg press 20# working on flexion, seat 2 PROM to R knee focusing on flexion  12/11/22 Nustep level 5 x 5 minutes Bike full revolutions with cheating x 5 minutes LEg extension 10# with passive extension LEg press 20# working on flexion PROM really focus on flexion  12/09/22 Gait outside around parking [place] no device good speed Nustep level 5 x 5 minutes Lunge step stretch LEg press working on flexion PROM to the right knee biggest focus on flexion but also did extension  12/06/22 Recheck goals to recert Passive stretching of R knee  NuStep L5 x68mins LE  Bike L1 partial revs x22mins  Step ups 6" Squats to 8" on treadmill 2x10  Leg press 40# 2x10 seat 2  12/04/22 Nustep level 5 x 6 minutes Leg extension 10# 2x10 Leg press able to get to position #2 today with toe even with foot plate top Fast walk and backward walk Passive stretch multiple ways, with her in prone, supine, hanging off bed and in sitting  12/02/22 Nustep level 5 x 6 minutes Walking outside with cane curbs LEg extension 10# with passive stretch b/n sets Leg curls 25# Fast walking forward and then backward walking Leg press 30# working on getting down to position #3 with 3 finger widths toe from the top of the foot plate Stairs step over step 4" and 6" Passive stretch to 102 degrees without much difficulty  11/29/22 Bike partial revs x31mins  Leg ext 10# trying to hit  weight 2x10, x5 eccentric lowering, 5# RLE x5 HS curls 35# x10, 25# x10 Leg press level 3 2x5 5s holds  Passive stretching into flexion, leg hooked on to table and lowering it until she can hold x3 with 15s holds   11/27/22 Nustep level 5 x 6 minutes Leg extension 10# working on flexion and trying tpo get back to the bottom Leg press Level 3 toes 3 finger widths from the top of foot plate, 6 reps and holding at the bottom for 5 seconds b/n sets Passive stretch into flexion to 105 degrees passively  11/25/22 Nustep level 4 x 6 minutes Leg extension 10# working on the flexion at the bottom Leg press 20# working on the flexion down to position #3 and 1 finger width from toe to top of foot plate Passive stretch of the knee working on flexion   PATIENT EDUCATION:  Education details: POC and HEP Person educated: Patient Education method: Explanation Education comprehension: verbalized understanding  HOME EXERCISE PROGRAM: Access Code: [email or web address]  ASSESSMENT:  CLINICAL IMPRESSION:  Patient is still very tight but works into the flexion after some passive stretching.  She was able to get to right  around 110 flexion passively.  Still with a good stretchy end feel  OBJECTIVE IMPAIRMENTS: Abnormal gait, difficulty walking, decreased ROM, decreased strength, and pain.   ACTIVITY LIMITATIONS: bending, sitting, standing, squatting, sleeping, stairs, transfers, bathing, toileting, and locomotion level  PARTICIPATION LIMITATIONS: meal prep, cleaning, laundry, driving, shopping, and community activity  REHAB POTENTIAL: Good  CLINICAL DECISION MAKING: Stable/uncomplicated  EVALUATION COMPLEXITY: Low   GOALS: Goals reviewed with patient? Yes  SHORT TERM GOALS: Target date: 10/24/22  Patient will be independent with initial HEP. Goal status: 09/20/22 met  2.  Patient will be do TUG < 30s Baseline: 1 min 28s  Goal status: ongoing 10/07/22, 18.57s MET 10/09/22  3.  Patient will be able to  do 5 sit to stands  Baseline: unable to do  Goal status: progressing 10/04/22, MET 10/09/22   LONG TERM GOALS: Target date: 01/31/23  Patient will be independent with advanced/ongoing HEP to improve outcomes and carryover.  Goal status: ongoing 11/06/22  2.  Patient will report at least 75% improvement in R knee pain to improve QOL. Baseline: 10/10 Goal status: IN PROGRESS  60% better 11/20/22, 65% 12/06/22, 70% 12/17/22  3.  Patient will demonstrate improved R knee AROM to >/= 0-120 deg to allow for normal gait and stair mechanics. Baseline: 30-0-60 10/09/22: 16-0-78  11/01/22: 14-0-83 11/20/22: 10-97 12/06/22: 10-0-100 12/17/22: 8-0-104 Goal status: IN PROGRESS  4.  Patient will demonstrate improved functional LE strength as demonstrated by 5/5. Baseline: 2/5 Goal status: MET 3+/5 10/09/22, 5/5 11/01/22   5.  Patient will be able to ambulate 500' with LRAD and normal gait pattern without increased pain to access community.  Baseline: using RW small in home ambulation distances Goal status: progressing w/SPC 12/02/22, doing some walking without AD   6.  Patient will report 26 on FOTO (patient reported outcome measure) to demonstrate improved functional ability. Baseline: 4, 40-11/11/24 Goal status: MET 53 12/17/22  PLAN:  PT FREQUENCY: 2x/week  PT DURATION: 12 weeks  PLANNED INTERVENTIONS: Therapeutic exercises, Therapeutic activity, Neuromuscular re-education, Balance training, Gait training, Patient/Family education, Self Care, Joint mobilization, Stair training, Electrical stimulation, Cryotherapy, Moist heat, scar mobilization, Vasopneumatic device, and Manual therapy  PLAN FOR NEXT SESSION: continue to push the ROM  Patient Details  Name: KRISTYE BRUHL MRN: [id number] Date of Birth: [birth year] Referring Provider:  Ollen Gross, MD  Encounter Date: 12/23/2022   Jearld Lesch, PT 12/23/2022, 5:03 PM

## 2022-12-25 ENCOUNTER — Ambulatory Visit: Payer: No Typology Code available for payment source | Admitting: Physical Therapy

## 2022-12-25 ENCOUNTER — Encounter: Payer: Self-pay | Admitting: Physical Therapy

## 2022-12-25 DIAGNOSIS — Z96651 Presence of right artificial knee joint: Secondary | ICD-10-CM | POA: Diagnosis not present

## 2022-12-25 DIAGNOSIS — M25561 Pain in right knee: Secondary | ICD-10-CM

## 2022-12-25 DIAGNOSIS — R2689 Other abnormalities of gait and mobility: Secondary | ICD-10-CM

## 2022-12-25 DIAGNOSIS — R600 Localized edema: Secondary | ICD-10-CM

## 2022-12-25 DIAGNOSIS — M25661 Stiffness of right knee, not elsewhere classified: Secondary | ICD-10-CM

## 2022-12-25 DIAGNOSIS — M6281 Muscle weakness (generalized): Secondary | ICD-10-CM

## 2022-12-25 NOTE — Therapy (Signed)
OUTPATIENT PHYSICAL THERAPY LOWER EXTREMITY TREATMENT     Patient Name: [name] MRN: [id number] DOB:08/21/1960, 62 y.o., female Today's Date: 12/25/2022  END OF SESSION:  PT End of Session - 12/25/22 1704     Visit Number 42    Date for PT Re-Evaluation 01/31/23    Authorization Type [employer]    PT Start Time 1658    PT Stop Time 1743    PT Time Calculation (min) 45 min    Activity Tolerance Patient tolerated treatment well    Behavior During Therapy WFL for tasks assessed/performed             Past Medical History:  Diagnosis Date   Arthritis    Back pain of lumbar region with sciatica    Cancer (HCC)    breast   Depression    Diabetes mellitus, type II (HCC)    Family history of anesthesia complication    grandfather died under anesthesia 70's- had heart issues   H/O carpal tunnel repair 2014   Headache(784.0)    Hyperlipidemia    Hypertension    Hypothyroidism    Medial meniscus tear    PONV (postoperative nausea and vomiting)    Sleep apnea    cpap since 10   Past Surgical History:  Procedure Laterality Date   BILATERAL TOTAL MASTECTOMY WITH AXILLARY LYMPH NODE DISSECTION     BREAST RECONSTRUCTION     CERVICAL DISC ARTHROPLASTY N/A 03/19/2013   Procedure: CERVICAL FIVE TO SIX, CERVICAL SIX TO SEVEN CERVICAL ANTERIOR DISC ARTHROPLASTY;  Surgeon: Barnett Abu, MD;  Location: [facility];  Service: Neurosurgery;  Laterality: N/A;  C56 C67 artificial disc replacement   CESAREAN SECTION     COLONOSCOPY     DILATATION & CURETTAGE/HYSTEROSCOPY WITH TRUECLEAR N/A 11/25/2013   Procedure: DILATATION & CURETTAGE/HYSTEROSCOPY WITH TRUCLEAR;  Surgeon: Meriel Pica, MD;  Location: [facility];  Service: Gynecology;  Laterality: N/A;   ECTOPIC PREGNANCY SURGERY     GALLBLADDER SURGERY     MOUTH SURGERY     child- dog bite   SPINAL FUSION     2023   STERIOD INJECTION Left 09/09/2022   Procedure: LEFT KNEE STEROID INJECTION;  Surgeon: Ollen Gross, MD;   Location: [facility];  Service: Orthopedics;  Laterality: Left;  left knee injection 0928   TONSILLECTOMY     TOTAL KNEE ARTHROPLASTY Right 09/09/2022   Procedure: TOTAL KNEE ARTHROPLASTY;  Surgeon: Ollen Gross, MD;  Location: [facility];  Service: Orthopedics;  Laterality: Right;   TUBAL LIGATION     Patient Active Problem List   Diagnosis Date Noted   OA (osteoarthritis) of knee 09/09/2022   Exertional dyspnea 04/17/2022   Elevated coronary artery calcium score 04/17/2022   Spondylolisthesis at L4-L5 level 12/27/2021   Ductal carcinoma in situ (DCIS) of left breast 01/19/2018   Generalized anxiety disorder 11/07/2017   Depression 11/07/2017   Insomnia 11/07/2017   Cervical spondylosis 03/19/2013    PCP: Creola Corn  REFERRING PROVIDER: Trudee Grip  REFERRING DIAG: R TKA 09/09/22  THERAPY DIAG:  S/P total knee arthroplasty, right  Acute pain of right knee  Stiffness of right knee, not elsewhere classified  Muscle weakness (generalized)  Localized edema  Other abnormalities of gait and mobility  Rationale for Evaluation and Treatment: Rehabilitation  ONSET DATE: 09/09/22  SUBJECTIVE:   SUBJECTIVE STATEMENT :  I was awful yesterday having that time off and then coming back on Monday ans stretching  PERTINENT HISTORY: R TKA 8/19  L knee steroid injection 09/09/22  PAIN:  Are you having pain? Yes: NPRS scale: 4/10 Pain location: R knee Pain description: sharp medial R knee  Aggravating factors: weight bearing on the RLE, bending, moving it  Relieving factors: ice helps some, pain meds  PRECAUTIONS: None  RED FLAGS: None   WEIGHT BEARING RESTRICTIONS: No  FALLS:  Has patient fallen in last 6 months? No  LIVING ENVIRONMENT: Lives with: lives with their family and lives with their spouse Lives in: House/apartment Stairs: Yes: External: 3 steps; none Has following equipment at home: Single point cane and Walker - 2 wheeled  OCCUPATION: Not working  PLOF:  Independent  PATIENT GOALS: No pain, to keep the knee moving  NEXT MD VISIT: 09/24/22  OBJECTIVE:   PATIENT SURVEYS:  FOTO 4  COGNITION: Overall cognitive status: Within functional limits for tasks assessed      EDEMA:  Increased swelling surrounding R knee  POSTURE: rounded shoulders and flexed trunk   PALPATION: Warm and TTP  LOWER EXTREMITY ROM:  Active ROM Right eval Left eval Right 09/24/22 Right 09/26/22 Right 10/02/22  11/01/22 12/09/22  Hip flexion          Hip extension          Hip abduction          Hip adduction          Hip internal rotation          Hip external rotation          Knee flexion  60  75* AROM, 86 PROM 82* AROM seated per pt request  78* self AAROM, 76* AROM  PROM 87 AROM 81 PROM 90 AROM 83 AROM 101 PROM 105  Knee extension -30  -15* AROM seated, -15 PROM supine   -16* supine heel prop with quad set  AROM 25 PROM 16 AROM 14 AROM 5 PROM 0  Ankle dorsiflexion          Ankle plantarflexion          Ankle inversion          Ankle eversion           (Blank rows = not tested)  LOWER EXTREMITY MMT:  MMT Right eval Left eval  Hip flexion 2   Hip extension    Hip abduction    Hip adduction    Hip internal rotation    Hip external rotation    Knee flexion 2   Knee extension 2   Ankle dorsiflexion    Ankle plantarflexion    Ankle inversion    Ankle eversion     (Blank rows = not tested)   FUNCTIONAL TESTS:  5 times sit to stand: unable to do  Timed up and go (TUG): 1 min 28s  GAIT: Distance walked: in clinic distances Assistive device utilized: Environmental consultant - 2 wheeled Level of assistance: SBA Comments: antalgic gait, slowed cadence, step to pattern, decreased step length and time on RLE    TODAY'S TREATMENT:  DATE:  12/25/22 Nustep level 5 x 6 minutes Bike full revolutions x 5 minutes LEg curls 20# Leg  extensoin 5# with passive flexion b/n Resisted gait 40# all directions Leg press down to position #2 working on flexion Passive stretch, some contract/relax  12/23/22 Nustep level 5 x 5 minutes 8" lunge stretch Bike partial and full revolutions Leg press 20# working on flexion Fast walking Backward walking Passive stretch to 109 degrees flexion  12/17/22 NuStep L5x30mins  Progress note- recheck ROM PROM working on knee flexion Supine hip flexor stretch 30s x2 Prone quad stretch 30s x2  Leg press 20# TLE 3x10 seat 4,3,2 Calf raises on leg press 20# 2x12  12/16/22 NuStep L5x88mins  Bike L3 x76mins  Resisted gait 20# 4 way x4  Calf stretch 15s x2  Calf raises 2x12 Leg ext 10# 2x10, RLE 5# x10 HS curls 25# 2x10, RLE 15# x10    12/13/22 NuStep L5 x51mins LE  Bike full revs x38mins with leaning  Step ups 6" Lateral step ups 6" Step overs 4" Lateral heel taps 4"  Leg press 20# working on flexion, seat 2 PROM to R knee focusing on flexion  12/11/22 Nustep level 5 x 5 minutes Bike full revolutions with cheating x 5 minutes LEg extension 10# with passive extension LEg press 20# working on flexion PROM really focus on flexion  12/09/22 Gait outside around parking [place] no device good speed Nustep level 5 x 5 minutes Lunge step stretch LEg press working on flexion PROM to the right knee biggest focus on flexion but also did extension  12/06/22 Recheck goals to recert Passive stretching of R knee  NuStep L5 x12mins LE  Bike L1 partial revs x73mins  Step ups 6" Squats to 8" on treadmill 2x10  Leg press 40# 2x10 seat 2  12/04/22 Nustep level 5 x 6 minutes Leg extension 10# 2x10 Leg press able to get to position #2 today with toe even with foot plate top Fast walk and backward walk Passive stretch multiple ways, with her in prone, supine, hanging off bed and in sitting  12/02/22 Nustep level 5 x 6 minutes Walking outside with cane curbs LEg extension 10# with  passive stretch b/n sets Leg curls 25# Fast walking forward and then backward walking Leg press 30# working on getting down to position #3 with 3 finger widths toe from the top of the foot plate Stairs step over step 4" and 6" Passive stretch to 102 degrees without much difficulty  PATIENT EDUCATION:  Education details: POC and HEP Person educated: Patient Education method: Explanation Education comprehension: verbalized understanding  HOME EXERCISE PROGRAM: Access Code: [email or web address]  ASSESSMENT:  CLINICAL IMPRESSION:  Patient still c/o soreness and stiffness, reports that she was very sore and did not move well after our session on Monday but thinks it could be due to having 5 days off, able to get her pretty far back today with some contract relax OBJECTIVE IMPAIRMENTS: Abnormal gait, difficulty walking, decreased ROM, decreased strength, and pain.   ACTIVITY LIMITATIONS: bending, sitting, standing, squatting, sleeping, stairs, transfers, bathing, toileting, and locomotion level  PARTICIPATION LIMITATIONS: meal prep, cleaning, laundry, driving, shopping, and community activity  REHAB POTENTIAL: Good  CLINICAL DECISION MAKING: Stable/uncomplicated  EVALUATION COMPLEXITY: Low   GOALS: Goals reviewed with patient? Yes  SHORT TERM GOALS: Target date: 10/24/22  Patient will be independent with initial HEP. Goal status: 09/20/22 met  2.  Patient will be do TUG < 30s Baseline: 1 min 28s  Goal  status: ongoing 10/07/22, 18.57s MET 10/09/22  3.  Patient will be able to do 5 sit to stands  Baseline: unable to do  Goal status: progressing 10/04/22, MET 10/09/22   LONG TERM GOALS: Target date: 01/31/23  Patient will be independent with advanced/ongoing HEP to improve outcomes and carryover.  Goal status: ongoing 11/06/22  2.  Patient will report at least 75% improvement in R knee pain to improve QOL. Baseline: 10/10 Goal status: IN PROGRESS  60% better 11/20/22, 65% 12/06/22, 70%  12/17/22  3.  Patient will demonstrate improved R knee AROM to >/= 0-120 deg to allow for normal gait and stair mechanics. Baseline: 30-0-60 10/09/22: 16-0-78  11/01/22: 14-0-83 11/20/22: 10-97 12/06/22: 10-0-100 12/17/22: 8-0-104 Goal status: IN PROGRESS  4.  Patient will demonstrate improved functional LE strength as demonstrated by 5/5. Baseline: 2/5 Goal status: MET 3+/5 10/09/22, 5/5 11/01/22   5.  Patient will be able to ambulate 500' with LRAD and normal gait pattern without increased pain to access community.  Baseline: using RW small in home ambulation distances Goal status: progressing w/SPC 12/02/22, doing some walking without AD   6.  Patient will report 54 on FOTO (patient reported outcome measure) to demonstrate improved functional ability. Baseline: 4, 40-11/11/24 Goal status: MET 53 12/17/22  PLAN:  PT FREQUENCY: 2x/week  PT DURATION: 12 weeks  PLANNED INTERVENTIONS: Therapeutic exercises, Therapeutic activity, Neuromuscular re-education, Balance training, Gait training, Patient/Family education, Self Care, Joint mobilization, Stair training, Electrical stimulation, Cryotherapy, Moist heat, scar mobilization, Vasopneumatic device, and Manual therapy  PLAN FOR NEXT SESSION: continue to push the ROM  Patient Details  Name: [name] MRN: [id number] Date of Birth: [birth year] Referring Provider:  Ollen Gross, MD  Encounter Date: 12/25/2022   Jearld Lesch, PT 12/25/2022, 5:05 PM

## 2022-12-27 ENCOUNTER — Ambulatory Visit: Payer: No Typology Code available for payment source

## 2022-12-30 ENCOUNTER — Ambulatory Visit: Payer: No Typology Code available for payment source | Admitting: Physical Therapy

## 2022-12-30 ENCOUNTER — Encounter: Payer: Self-pay | Admitting: Physical Therapy

## 2022-12-30 DIAGNOSIS — M25661 Stiffness of right knee, not elsewhere classified: Secondary | ICD-10-CM

## 2022-12-30 DIAGNOSIS — Z96651 Presence of right artificial knee joint: Secondary | ICD-10-CM

## 2022-12-30 DIAGNOSIS — R600 Localized edema: Secondary | ICD-10-CM

## 2022-12-30 DIAGNOSIS — R2689 Other abnormalities of gait and mobility: Secondary | ICD-10-CM

## 2022-12-30 DIAGNOSIS — M25561 Pain in right knee: Secondary | ICD-10-CM

## 2022-12-30 DIAGNOSIS — M6281 Muscle weakness (generalized): Secondary | ICD-10-CM

## 2022-12-30 NOTE — Therapy (Signed)
OUTPATIENT PHYSICAL THERAPY LOWER EXTREMITY TREATMENT     Patient Name: [name] MRN: [id number] DOB:[birth year], 62 y.o., female Today's Date: 12/30/2022  END OF SESSION:  PT End of Session - 12/30/22 1712     Visit Number 43    Date for PT Re-Evaluation 01/31/23    Authorization Type [employer]    PT Start Time 1700    PT Stop Time 1745    PT Time Calculation (min) 45 min    Activity Tolerance Patient tolerated treatment well    Behavior During Therapy WFL for tasks assessed/performed             Past Medical History:  Diagnosis Date   Arthritis    Back pain of lumbar region with sciatica    Cancer (HCC)    breast   Depression    Diabetes mellitus, type II (HCC)    Family history of anesthesia complication    grandfather died under anesthesia 70's- had heart issues   H/O carpal tunnel repair 2014   Headache(784.0)    Hyperlipidemia    Hypertension    Hypothyroidism    Medial meniscus tear    PONV (postoperative nausea and vomiting)    Sleep apnea    cpap since 10   Past Surgical History:  Procedure Laterality Date   BILATERAL TOTAL MASTECTOMY WITH AXILLARY LYMPH NODE DISSECTION     BREAST RECONSTRUCTION     CERVICAL DISC ARTHROPLASTY N/A 03/19/2013   Procedure: CERVICAL FIVE TO SIX, CERVICAL SIX TO SEVEN CERVICAL ANTERIOR DISC ARTHROPLASTY;  Surgeon: Barnett Abu, MD;  Location: [facility];  Service: Neurosurgery;  Laterality: N/A;  C56 C67 artificial disc replacement   CESAREAN SECTION     COLONOSCOPY     DILATATION & CURETTAGE/HYSTEROSCOPY WITH TRUECLEAR N/A 11/25/2013   Procedure: DILATATION & CURETTAGE/HYSTEROSCOPY WITH TRUCLEAR;  Surgeon: Meriel Pica, MD;  Location: [facility];  Service: Gynecology;  Laterality: N/A;   ECTOPIC PREGNANCY SURGERY     GALLBLADDER SURGERY     MOUTH SURGERY     child- dog bite   SPINAL FUSION     2023   STERIOD INJECTION Left 09/09/2022   Procedure: LEFT KNEE STEROID INJECTION;  Surgeon: Ollen Gross, MD;   Location: [facility];  Service: Orthopedics;  Laterality: Left;  left knee injection 0928   TONSILLECTOMY     TOTAL KNEE ARTHROPLASTY Right 09/09/2022   Procedure: TOTAL KNEE ARTHROPLASTY;  Surgeon: Ollen Gross, MD;  Location: [facility];  Service: Orthopedics;  Laterality: Right;   TUBAL LIGATION     Patient Active Problem List   Diagnosis Date Noted   OA (osteoarthritis) of knee 09/09/2022   Exertional dyspnea 04/17/2022   Elevated coronary artery calcium score 04/17/2022   Spondylolisthesis at L4-L5 level 12/27/2021   Ductal carcinoma in situ (DCIS) of left breast 01/19/2018   Generalized anxiety disorder 11/07/2017   Depression 11/07/2017   Insomnia 11/07/2017   Cervical spondylosis 03/19/2013    PCP: Creola Corn  REFERRING PROVIDER: Trudee Grip  REFERRING DIAG: R TKA 09/09/22  THERAPY DIAG:  S/P total knee arthroplasty, right  Acute pain of right knee  Stiffness of right knee, not elsewhere classified  Muscle weakness (generalized)  Localized edema  Other abnormalities of gait and mobility  Rationale for Evaluation and Treatment: Rehabilitation  ONSET DATE: 09/09/22  SUBJECTIVE:   SUBJECTIVE STATEMENT :  I was active all weekend and did not do a lot of exercises  PERTINENT HISTORY: R TKA 8/19 L knee steroid  injection 09/09/22  PAIN:  Are you having pain? Yes: NPRS scale: 4/10 Pain location: R knee Pain description: sharp medial R knee  Aggravating factors: weight bearing on the RLE, bending, moving it  Relieving factors: ice helps some, pain meds  PRECAUTIONS: None  RED FLAGS: None   WEIGHT BEARING RESTRICTIONS: No  FALLS:  Has patient fallen in last 6 months? No  LIVING ENVIRONMENT: Lives with: lives with their family and lives with their spouse Lives in: House/apartment Stairs: Yes: External: 3 steps; none Has following equipment at home: Single point cane and Walker - 2 wheeled  OCCUPATION: Not working  PLOF: Independent  PATIENT GOALS: No  pain, to keep the knee moving  NEXT MD VISIT: 09/24/22  OBJECTIVE:   PATIENT SURVEYS:  FOTO 4  COGNITION: Overall cognitive status: Within functional limits for tasks assessed      EDEMA:  Increased swelling surrounding R knee  POSTURE: rounded shoulders and flexed trunk   PALPATION: Warm and TTP  LOWER EXTREMITY ROM:  Active ROM Right eval Left eval Right 09/24/22 Right 09/26/22 Right 10/02/22  11/01/22 12/09/22  Hip flexion          Hip extension          Hip abduction          Hip adduction          Hip internal rotation          Hip external rotation          Knee flexion  60  75* AROM, 86 PROM 82* AROM seated per pt request  78* self AAROM, 76* AROM  PROM 87 AROM 81 PROM 90 AROM 83 AROM 101 PROM 105  Knee extension -30  -15* AROM seated, -15 PROM supine   -16* supine heel prop with quad set  AROM 25 PROM 16 AROM 14 AROM 5 PROM 0  Ankle dorsiflexion          Ankle plantarflexion          Ankle inversion          Ankle eversion           (Blank rows = not tested)  LOWER EXTREMITY MMT:  MMT Right eval Left eval  Hip flexion 2   Hip extension    Hip abduction    Hip adduction    Hip internal rotation    Hip external rotation    Knee flexion 2   Knee extension 2   Ankle dorsiflexion    Ankle plantarflexion    Ankle inversion    Ankle eversion     (Blank rows = not tested)   FUNCTIONAL TESTS:  5 times sit to stand: unable to do  Timed up and go (TUG): 1 min 28s  GAIT: Distance walked: in clinic distances Assistive device utilized: Environmental consultant - 2 wheeled Level of assistance: SBA Comments: antalgic gait, slowed cadence, step to pattern, decreased step length and time on RLE    TODAY'S TREATMENT:  DATE:  12/30/22 Bike full revolutions with trunk lean x 7 minutes Right leg curls 20# 2x10 Right leg extension 5# 2x10 Leg press 20#  going to position #1 today 5# right leg extension, abduction, SLR and hip flexion Passive stretch with her in prone and then in sitting continuing to push the ROM  12/25/22 Nustep level 5 x 6 minutes Bike full revolutions x 5 minutes LEg curls 20# Leg extensoin 5# with passive flexion b/n Resisted gait 40# all directions Leg press down to position #2 working on flexion Passive stretch, some contract/relax  12/23/22 Nustep level 5 x 5 minutes 8" lunge stretch Bike partial and full revolutions Leg press 20# working on flexion Fast walking Backward walking Passive stretch to 109 degrees flexion  12/17/22 NuStep L5x51mins  Progress note- recheck ROM PROM working on knee flexion Supine hip flexor stretch 30s x2 Prone quad stretch 30s x2  Leg press 20# TLE 3x10 seat 4,3,2 Calf raises on leg press 20# 2x12  12/16/22 NuStep L5x2mins  Bike L3 x64mins  Resisted gait 20# 4 way x4  Calf stretch 15s x2  Calf raises 2x12 Leg ext 10# 2x10, RLE 5# x10 HS curls 25# 2x10, RLE 15# x10    12/13/22 NuStep L5 x98mins LE  Bike full revs x12mins with leaning  Step ups 6" Lateral step ups 6" Step overs 4" Lateral heel taps 4"  Leg press 20# working on flexion, seat 2 PROM to R knee focusing on flexion  12/11/22 Nustep level 5 x 5 minutes Bike full revolutions with cheating x 5 minutes LEg extension 10# with passive extension LEg press 20# working on flexion PROM really focus on flexion  12/09/22 Gait outside around parking [place] no device good speed Nustep level 5 x 5 minutes Lunge step stretch LEg press working on flexion PROM to the right knee biggest focus on flexion but also did extension  12/06/22 Recheck goals to recert Passive stretching of R knee  NuStep L5 x21mins LE  Bike L1 partial revs x52mins  Step ups 6" Squats to 8" on treadmill 2x10  Leg press 40# 2x10 seat 2  12/04/22 Nustep level 5 x 6 minutes Leg extension 10# 2x10 Leg press able to get to position #2  today with toe even with foot plate top Fast walk and backward walk Passive stretch multiple ways, with her in prone, supine, hanging off bed and in sitting  12/02/22 Nustep level 5 x 6 minutes Walking outside with cane curbs LEg extension 10# with passive stretch b/n sets Leg curls 25# Fast walking forward and then backward walking Leg press 30# working on getting down to position #3 with 3 finger widths toe from the top of the foot plate Stairs step over step 4" and 6" Passive stretch to 102 degrees without much difficulty  PATIENT EDUCATION:  Education details: POC and HEP Person educated: Patient Education method: Explanation Education comprehension: verbalized understanding  HOME EXERCISE PROGRAM: Access Code: [email or web address]  ASSESSMENT:  CLINICAL IMPRESSION:  Patient drove for the first time since her surgery this weekend and drove here today, I was able to move the leg press down to position #1 and then really pushed the ROM and it still feels like the end feel will allow more ROM OBJECTIVE IMPAIRMENTS: Abnormal gait, difficulty walking, decreased ROM, decreased strength, and pain.   ACTIVITY LIMITATIONS: bending, sitting, standing, squatting, sleeping, stairs, transfers, bathing, toileting, and locomotion level  PARTICIPATION LIMITATIONS: meal prep, cleaning, laundry, driving, shopping, and community activity  REHAB POTENTIAL:  Good  CLINICAL DECISION MAKING: Stable/uncomplicated  EVALUATION COMPLEXITY: Low   GOALS: Goals reviewed with patient? Yes  SHORT TERM GOALS: Target date: 10/24/22  Patient will be independent with initial HEP. Goal status: 09/20/22 met  2.  Patient will be do TUG < 30s Baseline: 1 min 28s  Goal status: ongoing 10/07/22, 18.57s MET 10/09/22  3.  Patient will be able to do 5 sit to stands  Baseline: unable to do  Goal status: progressing 10/04/22, MET 10/09/22   LONG TERM GOALS: Target date: 01/31/23  Patient will be independent with  advanced/ongoing HEP to improve outcomes and carryover.  Goal status: ongoing 11/06/22  2.  Patient will report at least 75% improvement in R knee pain to improve QOL. Baseline: 10/10 Goal status: IN PROGRESS  60% better 11/20/22, 65% 12/06/22, 70% 12/17/22  3.  Patient will demonstrate improved R knee AROM to >/= 0-120 deg to allow for normal gait and stair mechanics. Baseline: 30-0-60 10/09/22: 16-0-78  11/01/22: 14-0-83 11/20/22: 10-97 12/06/22: 10-0-100 12/17/22: 8-0-104 Goal status: IN PROGRESS  4.  Patient will demonstrate improved functional LE strength as demonstrated by 5/5. Baseline: 2/5 Goal status: MET 3+/5 10/09/22, 5/5 11/01/22   5.  Patient will be able to ambulate 500' with LRAD and normal gait pattern without increased pain to access community.  Baseline: using RW small in home ambulation distances Goal status: progressing w/SPC 12/02/22, doing some walking without AD   6.  Patient will report 79 on FOTO (patient reported outcome measure) to demonstrate improved functional ability. Baseline: 4, 40-11/11/24 Goal status: MET 53 12/17/22  PLAN:  PT FREQUENCY: 2x/week  PT DURATION: 12 weeks  PLANNED INTERVENTIONS: Therapeutic exercises, Therapeutic activity, Neuromuscular re-education, Balance training, Gait training, Patient/Family education, Self Care, Joint mobilization, Stair training, Electrical stimulation, Cryotherapy, Moist heat, scar mobilization, Vasopneumatic device, and Manual therapy  PLAN FOR NEXT SESSION: continue to push the ROM  Patient Details  Name: [name] MRN: [id number] Date of Birth: 14-Oct-1960 Referring Provider:  Ollen Gross, MD  Encounter Date: 12/30/2022   Jearld Lesch, PT 12/30/2022, 5:12 PM

## 2022-12-31 ENCOUNTER — Encounter: Payer: Self-pay | Admitting: Professional Counselor

## 2022-12-31 ENCOUNTER — Ambulatory Visit (INDEPENDENT_AMBULATORY_CARE_PROVIDER_SITE_OTHER): Payer: No Typology Code available for payment source | Admitting: Professional Counselor

## 2022-12-31 DIAGNOSIS — F411 Generalized anxiety disorder: Secondary | ICD-10-CM | POA: Diagnosis not present

## 2022-12-31 NOTE — Progress Notes (Signed)
      [facility] Counselor/Therapist Progress Note  Patient ID: AJAHNAE SALGADO, MRN: [id number],    Date: 12/31/2022  Time Spent: 4:10 PM to 5:20 PM  Treatment Type: Family with patient  Patient presented to session with spouse.   Reported Symptoms: migraines, stress, worries, sleeplessness, social anxiety, interpersonal concerns, emotional dysregulation  Mental Status Exam:  Appearance:   Neat     Behavior:  Appropriate, Sharing, and Motivated  Motor:  Normal  Speech/Language:   Clear and Coherent and Normal Rate  Affect:  Full Range  Mood:  anxious  Thought process:  normal  Thought content:    WNL  Sensory/Perceptual disturbances:    WNL  Orientation:  Sound  Attention:  Good  Concentration:  Good  Memory:  WNL  Fund of knowledge:   Good  Insight:    Good  Judgment:   Good  Impulse Control:  Good   Risk Assessment: Danger to Self:  No Self-injurious Behavior: No Danger to Others: No Duty to Warn:no Physical Aggression / Violence:No  Access to Firearms a concern: No  Gang Involvement:No   Subjective: Patient presented to session to address concerns of anxiety.  She reported mixed progress at this time.  Patient presented to session with her spouse.  Patient processed experience of health and medical event for her dog after accident swallowing glass.  Patient and spouse processed stressful experience and impact on them relationally, and they voiced progress in terms of how they approached challenging situation and one another.  Counselor provided psychoeducation on window of tolerance and cognitive behavioral therapy, including resources and interventions for patient and spouse to utilize.  They discussed attunement, and mindset of recognizing states of overwhelm while also being mindful of pacing and measuring responses.  Patient identified short-term goal of utilizing emotional regulation skills.  Interventions: Solution-Oriented/Positive Psychology,  Humanistic/Existential, Insight-Oriented, and Family Systems  Diagnosis:   ICD-10-CM   1. Generalized anxiety disorder  F41.1      Plan: Patient is scheduled for follow-up; continue process work and developing coping skills.  Patient to practice short-term goal between sessions as described above.  Progress note dictated via Dragon and reviewed for accuracy.  Gaspar Bidding, [facility]

## 2023-01-01 ENCOUNTER — Encounter: Payer: Self-pay | Admitting: Physical Therapy

## 2023-01-01 ENCOUNTER — Ambulatory Visit: Payer: No Typology Code available for payment source | Admitting: Physical Therapy

## 2023-01-01 DIAGNOSIS — Z96651 Presence of right artificial knee joint: Secondary | ICD-10-CM

## 2023-01-01 DIAGNOSIS — M25561 Pain in right knee: Secondary | ICD-10-CM

## 2023-01-01 DIAGNOSIS — M25661 Stiffness of right knee, not elsewhere classified: Secondary | ICD-10-CM

## 2023-01-01 DIAGNOSIS — M6281 Muscle weakness (generalized): Secondary | ICD-10-CM

## 2023-01-01 NOTE — Therapy (Signed)
OUTPATIENT PHYSICAL THERAPY LOWER EXTREMITY TREATMENT     Patient Name: [name] MRN: [id number] DOB:July 02, 1960, 62 y.o., female Today's Date: 01/01/2023  END OF SESSION:  PT End of Session - 01/01/23 1703     Visit Number 44    Date for PT Re-Evaluation 01/31/23    Authorization Type [employer]    PT Start Time 1700    PT Stop Time 1745    PT Time Calculation (min) 45 min    Activity Tolerance Patient tolerated treatment well    Behavior During Therapy WFL for tasks assessed/performed             Past Medical History:  Diagnosis Date   Arthritis    Back pain of lumbar region with sciatica    Cancer (HCC)    breast   Depression    Diabetes mellitus, type II (HCC)    Family history of anesthesia complication    grandfather died under anesthesia 70's- had heart issues   H/O carpal tunnel repair 2014   Headache(784.0)    Hyperlipidemia    Hypertension    Hypothyroidism    Medial meniscus tear    PONV (postoperative nausea and vomiting)    Sleep apnea    cpap since 10   Past Surgical History:  Procedure Laterality Date   BILATERAL TOTAL MASTECTOMY WITH AXILLARY LYMPH NODE DISSECTION     BREAST RECONSTRUCTION     CERVICAL DISC ARTHROPLASTY N/A 03/19/2013   Procedure: CERVICAL FIVE TO SIX, CERVICAL SIX TO SEVEN CERVICAL ANTERIOR DISC ARTHROPLASTY;  Surgeon: Barnett Abu, MD;  Location: [facility];  Service: Neurosurgery;  Laterality: N/A;  C56 C67 artificial disc replacement   CESAREAN SECTION     COLONOSCOPY     DILATATION & CURETTAGE/HYSTEROSCOPY WITH TRUECLEAR N/A 11/25/2013   Procedure: DILATATION & CURETTAGE/HYSTEROSCOPY WITH TRUCLEAR;  Surgeon: Meriel Pica, MD;  Location: [facility];  Service: Gynecology;  Laterality: N/A;   ECTOPIC PREGNANCY SURGERY     GALLBLADDER SURGERY     MOUTH SURGERY     child- dog bite   SPINAL FUSION     2023   STERIOD INJECTION Left 09/09/2022   Procedure: LEFT KNEE STEROID INJECTION;  Surgeon: Ollen Gross, MD;   Location: [facility];  Service: Orthopedics;  Laterality: Left;  left knee injection 0928   TONSILLECTOMY     TOTAL KNEE ARTHROPLASTY Right 09/09/2022   Procedure: TOTAL KNEE ARTHROPLASTY;  Surgeon: Ollen Gross, MD;  Location: [facility];  Service: Orthopedics;  Laterality: Right;   TUBAL LIGATION     Patient Active Problem List   Diagnosis Date Noted   OA (osteoarthritis) of knee 09/09/2022   Exertional dyspnea 04/17/2022   Elevated coronary artery calcium score 04/17/2022   Spondylolisthesis at L4-L5 level 12/27/2021   Ductal carcinoma in situ (DCIS) of left breast 01/19/2018   Generalized anxiety disorder 11/07/2017   Depression 11/07/2017   Insomnia 11/07/2017   Cervical spondylosis 03/19/2013    PCP: Creola Corn  REFERRING PROVIDER: Trudee Grip  REFERRING DIAG: R TKA 09/09/22  THERAPY DIAG:  S/P total knee arthroplasty, right  Acute pain of right knee  Stiffness of right knee, not elsewhere classified  Muscle weakness (generalized)  Rationale for Evaluation and Treatment: Rehabilitation  ONSET DATE: 09/09/22  SUBJECTIVE:   SUBJECTIVE STATEMENT :  I was pretty sore after last visit.  Reports that she is trying to not take any oxy and is only taking Tramadol today  PERTINENT HISTORY: R TKA 8/19 L knee  steroid injection 09/09/22  PAIN:  Are you having pain? Yes: NPRS scale: 4/10 Pain location: R knee Pain description: sharp medial R knee  Aggravating factors: weight bearing on the RLE, bending, moving it  Relieving factors: ice helps some, pain meds  PRECAUTIONS: None  RED FLAGS: None   WEIGHT BEARING RESTRICTIONS: No  FALLS:  Has patient fallen in last 6 months? No  LIVING ENVIRONMENT: Lives with: lives with their family and lives with their spouse Lives in: House/apartment Stairs: Yes: External: 3 steps; none Has following equipment at home: Single point cane and Walker - 2 wheeled  OCCUPATION: Not working  PLOF: Independent  PATIENT GOALS: No  pain, to keep the knee moving  NEXT MD VISIT: 09/24/22  OBJECTIVE:   PATIENT SURVEYS:  FOTO 4  COGNITION: Overall cognitive status: Within functional limits for tasks assessed      EDEMA:  Increased swelling surrounding R knee  POSTURE: rounded shoulders and flexed trunk   PALPATION: Warm and TTP  LOWER EXTREMITY ROM:  Active ROM Right eval Left eval Right 09/24/22 Right 09/26/22 Right 10/02/22  11/01/22 12/09/22  Hip flexion          Hip extension          Hip abduction          Hip adduction          Hip internal rotation          Hip external rotation          Knee flexion  60  75* AROM, 86 PROM 82* AROM seated per pt request  78* self AAROM, 76* AROM  PROM 87 AROM 81 PROM 90 AROM 83 AROM 101 PROM 105  Knee extension -30  -15* AROM seated, -15 PROM supine   -16* supine heel prop with quad set  AROM 25 PROM 16 AROM 14 AROM 5 PROM 0  Ankle dorsiflexion          Ankle plantarflexion          Ankle inversion          Ankle eversion           (Blank rows = not tested)  LOWER EXTREMITY MMT:  MMT Right eval Left eval  Hip flexion 2   Hip extension    Hip abduction    Hip adduction    Hip internal rotation    Hip external rotation    Knee flexion 2   Knee extension 2   Ankle dorsiflexion    Ankle plantarflexion    Ankle inversion    Ankle eversion     (Blank rows = not tested)   FUNCTIONAL TESTS:  5 times sit to stand: unable to do  Timed up and go (TUG): 1 min 28s  GAIT: Distance walked: in clinic distances Assistive device utilized: Environmental consultant - 2 wheeled Level of assistance: SBA Comments: antalgic gait, slowed cadence, step to pattern, decreased step length and time on RLE    TODAY'S TREATMENT:  DATE:  01/01/23 Nustep level 5 x 6 minutes LE only Bike level 2 x 6 minutes full revolutions Resisted gait all directions, 6" step up  forward with CGA, 20# Fast walking fwd and back ward Leg curls 20# 2x10 Tmill push Leg extension left only 5# Leg press 20# position #1  Passive stretch flexion  Passive to 115 and AROM to 105 degrees  12/30/22 Bike full revolutions with trunk lean x 7 minutes Right leg curls 20# 2x10 Right leg extension 5# 2x10 Leg press 20# going to position #1 today 5# right leg extension, abduction, SLR and hip flexion Passive stretch with her in prone and then in sitting continuing to push the ROM  12/25/22 Nustep level 5 x 6 minutes Bike full revolutions x 5 minutes LEg curls 20# Leg extensoin 5# with passive flexion b/n Resisted gait 40# all directions Leg press down to position #2 working on flexion Passive stretch, some contract/relax  12/23/22 Nustep level 5 x 5 minutes 8" lunge stretch Bike partial and full revolutions Leg press 20# working on flexion Fast walking Backward walking Passive stretch to 109 degrees flexion  12/17/22 NuStep L5x15mins  Progress note- recheck ROM PROM working on knee flexion Supine hip flexor stretch 30s x2 Prone quad stretch 30s x2  Leg press 20# TLE 3x10 seat 4,3,2 Calf raises on leg press 20# 2x12  12/16/22 NuStep L5x9mins  Bike L3 x32mins  Resisted gait 20# 4 way x4  Calf stretch 15s x2  Calf raises 2x12 Leg ext 10# 2x10, RLE 5# x10 HS curls 25# 2x10, RLE 15# x10    12/13/22 NuStep L5 x50mins LE  Bike full revs x58mins with leaning  Step ups 6" Lateral step ups 6" Step overs 4" Lateral heel taps 4"  Leg press 20# working on flexion, seat 2 PROM to R knee focusing on flexion  12/11/22 Nustep level 5 x 5 minutes Bike full revolutions with cheating x 5 minutes LEg extension 10# with passive extension LEg press 20# working on flexion PROM really focus on flexion  12/09/22 Gait outside around parking [place] no device good speed Nustep level 5 x 5 minutes Lunge step stretch LEg press working on flexion PROM to the right knee  biggest focus on flexion but also did extension  12/06/22 Recheck goals to recert Passive stretching of R knee  NuStep L5 x28mins LE  Bike L1 partial revs x2mins  Step ups 6" Squats to 8" on treadmill 2x10  Leg press 40# 2x10 seat 2  PATIENT EDUCATION:  Education details: POC and HEP Person educated: Patient Education method: Explanation Education comprehension: verbalized understanding  HOME EXERCISE PROGRAM: Access Code: [id number]  ASSESSMENT:  CLINICAL IMPRESSION:  Patient is trying to not take the oxy, is only taking Tramadol today.  She had some anxiety about not taking the oxy, but really did quite well.  I was able to push her to 115 degrees flexion, looking back at the notes about a month ago I pushed her to 102 degrees flexion, so this is a very good progress and she was able to get to 105 degrees on her own.   OBJECTIVE IMPAIRMENTS: Abnormal gait, difficulty walking, decreased ROM, decreased strength, and pain.   ACTIVITY LIMITATIONS: bending, sitting, standing, squatting, sleeping, stairs, transfers, bathing, toileting, and locomotion level  PARTICIPATION LIMITATIONS: meal prep, cleaning, laundry, driving, shopping, and community activity  REHAB POTENTIAL: Good  CLINICAL DECISION MAKING: Stable/uncomplicated  EVALUATION COMPLEXITY: Low   GOALS: Goals reviewed with patient? Yes  SHORT TERM  GOALS: Target date: 10/24/22  Patient will be independent with initial HEP. Goal status: 09/20/22 met  2.  Patient will be do TUG < 30s Baseline: 1 min 28s  Goal status: ongoing 10/07/22, 18.57s MET 10/09/22  3.  Patient will be able to do 5 sit to stands  Baseline: unable to do  Goal status: progressing 10/04/22, MET 10/09/22   LONG TERM GOALS: Target date: 01/31/23  Patient will be independent with advanced/ongoing HEP to improve outcomes and carryover.  Goal status: ongoing 11/06/22  2.  Patient will report at least 75% improvement in R knee pain to improve  QOL. Baseline: 10/10 Goal status: IN PROGRESS  60% better 11/20/22, 65% 12/06/22, 70% 12/17/22  3.  Patient will demonstrate improved R knee AROM to >/= 0-120 deg to allow for normal gait and stair mechanics. Baseline: 30-0-60 10/09/22: 16-0-78  11/01/22: 14-0-83 11/20/22: 10-97 12/06/22: 10-0-100 12/17/22: 8-0-104 Goal status: IN PROGRESS  4.  Patient will demonstrate improved functional LE strength as demonstrated by 5/5. Baseline: 2/5 Goal status: MET 3+/5 10/09/22, 5/5 11/01/22   5.  Patient will be able to ambulate 500' with LRAD and normal gait pattern without increased pain to access community.  Baseline: using RW small in home ambulation distances Goal status: progressing w/SPC 12/02/22, doing some walking without AD   6.  Patient will report 12 on FOTO (patient reported outcome measure) to demonstrate improved functional ability. Baseline: 4, 40-11/11/24 Goal status: MET 53 12/17/22  PLAN:  PT FREQUENCY: 2x/week  PT DURATION: 12 weeks  PLANNED INTERVENTIONS: Therapeutic exercises, Therapeutic activity, Neuromuscular re-education, Balance training, Gait training, Patient/Family education, Self Care, Joint mobilization, Stair training, Electrical stimulation, Cryotherapy, Moist heat, scar mobilization, Vasopneumatic device, and Manual therapy  PLAN FOR NEXT SESSION: continue to push the ROM, she will see MD next Wednesday  Patient Details  Name: [name] MRN: [id number] Date of Birth: [birth year] Referring Provider:  Ollen Gross, MD  Encounter Date: 01/01/2023   Jearld Lesch, PT 01/01/2023, 5:04 PM

## 2023-01-03 ENCOUNTER — Ambulatory Visit: Payer: No Typology Code available for payment source

## 2023-01-06 ENCOUNTER — Encounter: Payer: Self-pay | Admitting: Physical Therapy

## 2023-01-06 ENCOUNTER — Ambulatory Visit: Payer: No Typology Code available for payment source | Admitting: Physical Therapy

## 2023-01-06 ENCOUNTER — Telehealth (INDEPENDENT_AMBULATORY_CARE_PROVIDER_SITE_OTHER): Payer: No Typology Code available for payment source | Admitting: Psychiatry

## 2023-01-06 ENCOUNTER — Encounter: Payer: Self-pay | Admitting: Psychiatry

## 2023-01-06 DIAGNOSIS — F32A Depression, unspecified: Secondary | ICD-10-CM

## 2023-01-06 DIAGNOSIS — F4310 Post-traumatic stress disorder, unspecified: Secondary | ICD-10-CM

## 2023-01-06 DIAGNOSIS — G4700 Insomnia, unspecified: Secondary | ICD-10-CM

## 2023-01-06 DIAGNOSIS — Z96651 Presence of right artificial knee joint: Secondary | ICD-10-CM

## 2023-01-06 DIAGNOSIS — M6281 Muscle weakness (generalized): Secondary | ICD-10-CM

## 2023-01-06 DIAGNOSIS — R600 Localized edema: Secondary | ICD-10-CM

## 2023-01-06 DIAGNOSIS — M25561 Pain in right knee: Secondary | ICD-10-CM

## 2023-01-06 DIAGNOSIS — F411 Generalized anxiety disorder: Secondary | ICD-10-CM | POA: Diagnosis not present

## 2023-01-06 DIAGNOSIS — M25661 Stiffness of right knee, not elsewhere classified: Secondary | ICD-10-CM

## 2023-01-06 MED ORDER — ALPRAZOLAM 0.5 MG PO TABS
ORAL_TABLET | ORAL | 2 refills | Status: AC
Start: 2023-03-15 — End: ?

## 2023-01-06 MED ORDER — GABAPENTIN 100 MG PO CAPS
100.0000 mg | ORAL_CAPSULE | Freq: Two times a day (BID) | ORAL | 1 refills | Status: AC | PRN
Start: 2023-01-23 — End: ?

## 2023-01-06 MED ORDER — GABAPENTIN 300 MG PO CAPS: 300.0000 mg | ORAL_CAPSULE | Freq: Every day | ORAL | 1 refills | Status: AC

## 2023-01-06 MED ORDER — VILAZODONE HCL 20 MG PO TABS
30.0000 mg | ORAL_TABLET | Freq: Every day | ORAL | 1 refills | Status: AC
Start: 2023-01-06 — End: 2023-11-10

## 2023-01-06 MED ORDER — DOXAZOSIN MESYLATE 4 MG PO TABS
4.0000 mg | ORAL_TABLET | Freq: Every day | ORAL | 1 refills | Status: AC
Start: 2023-01-06 — End: ?

## 2023-01-06 MED ORDER — DAYVIGO 10 MG PO TABS
10.0000 mg | ORAL_TABLET | Freq: Every day | ORAL | 1 refills | Status: AC
Start: 2023-03-31 — End: ?

## 2023-01-06 NOTE — Progress Notes (Signed)
[name] [id number] 09-09-1960 62 y.o.  Subjective:   Patient ID:  [name] is a 62 y.o. (DOB Dec 27, 1960) female.  Chief Complaint:  Chief Complaint  Patient presents with   Depression   Anxiety   Follow-up    Insomnia    Depression        Past medical history includes anxiety.   Anxiety     [name] presents to the office today for follow-up of anxiety, depression, and insomnia. She continues to recover from knee replacement surgery. She has some anxiety about recovery from knee replacement. She reports depression in response to situational factors. She reports that she has had increased anxiety since husband moved back in after her knee surgery. She has had some panic attacks. She reports adequate sleep with medication. She reports that her energy and motivation have been low after flu and COVID vaccines on 01/03/23. She reports that she has been losing weight and has had decreased appetite. Concentration is ok. She reports that she had SI on one occasion and reached out to someone. Denies current SI.  She is now able to drive herself to appointments.   Daughter moved out of the house and moved in with her boyfriend.   She and her husband are seeing a couples therapist.   Continues to see Bradley Ferris, LCAS weekly for individual therapy.   Reports that her dog is dying.   Gabapentin 300 mg last filled 12/21/22. Gabapentin 100 mg last filled 10/31/22.  Alprazolam last filled 12/21/22 x 1.  Dayvigo last filled 10/07/22.  Past Psychiatric Medication Trials: Ambien- Has been taking 5-10 mg Intermezzo- Helpful for sleep initiation Xanax Effexor XR-Multiple adverse effects. Severe discontinuation s/s. Zonisamide- SI. Lexapro-Concentration difficulty Viibryd Elavil- Apatheric, difficulty with work Topamax- Anger, irritability Benadryl- partially effective.  Dayvigo- Effective. Has mild drowsiness the following day.  Doxepin- Excessive  sedation Doxazosin Gabapentin    GAD-7    Flowsheet Row Counselor from 08/30/2022 in [facility]  Total GAD-7 Score 9      PHQ2-9    Flowsheet Row Counselor from 08/30/2022 in [facility]  PHQ-2 Total Score 1  PHQ-9 Total Score 6      Flowsheet Row Admission (Discharged) from 09/09/2022 in [facility] Pre-Admission Testing 60 from 08/28/2022 in [facility] Admission (Discharged) from 12/27/2021 in [facility]  C-SSRS RISK CATEGORY No Risk No Risk No Risk        Review of Systems:  Review of Systems  Musculoskeletal:  Negative for gait problem.       Limited ROM with knee  Neurological:  Negative for tremors.  Psychiatric/Behavioral:  Positive for depression.        Please refer to HPI    Medications: I have reviewed the patient's current medications.  Current Outpatient Medications  Medication Sig Dispense Refill   methocarbamol (ROBAXIN) 500 MG tablet Take 1 tablet (500 mg total) by mouth every 6 (six) hours as needed for muscle spasms. 40 tablet 0   traMADol (ULTRAM) 50 MG tablet Take 1-2 tablets (50-100 mg total) by mouth every 6 (six) hours as needed for moderate pain. 40 tablet 0   [START ON 03/15/2023] ALPRAZolam (XANAX) 0.5 MG tablet Take 1/2-1 tablet three times daily as needed for anxiety or insomnia 75 tablet 2   atorvastatin (LIPITOR) 20 MG tablet Take 1 tablet (20 mg total) by mouth daily. 90 tablet 3  doxazosin (CARDURA) 4 MG tablet Take 1 tablet (4 mg total) by mouth at bedtime. 90 tablet 1   famotidine-calcium carbonate-magnesium hydroxide (PEPCID COMPLETE) 10-800-165 MG chewable tablet Chew 1 tablet by mouth daily as needed (with aleve for pain.).     [START ON 01/23/2023] gabapentin (NEURONTIN) 100 MG capsule Take 1 capsule (100 mg total) by mouth 2 (two) times daily as needed (anxiety). 180 capsule 1   [START ON 03/15/2023]  gabapentin (NEURONTIN) 300 MG capsule Take 1 capsule (300 mg total) by mouth at bedtime. 90 capsule 1   Galcanezumab-gnlm 120 MG/ML SOAJ Inject 120 mg into the skin every 30 (thirty) days.     GOODSENSE PSYLLIUM FIBER PO Take 4 capsules by mouth every Monday, Tuesday, Wednesday, Thursday, and Friday.     hydrochlorothiazide (MICROZIDE) 12.5 MG capsule Take 12.5 mg by mouth in the morning.     JARDIANCE 10 MG TABS tablet Take 10 mg by mouth in the morning.     [START ON 03/31/2023] Lemborexant (DAYVIGO) 10 MG TABS Take 1 tablet (10 mg total) by mouth at bedtime. 90 tablet 1   metFORMIN (GLUCOPHAGE) 500 MG tablet Take 500 mg by mouth daily with supper.     metoprolol succinate (TOPROL-XL) 100 MG 24 hr tablet Take 100 mg by mouth at bedtime.     mometasone (ELOCON) 0.1 % lotion 5 drops daily. Instill 5 drops into right ear daily     Naphazoline-Pheniramine (OPCON-A) 0.027-0.315 % SOLN Place 1-2 drops into both eyes 3 (three) times daily as needed (irritated/allergy eyes). (Patient not taking: Reported on 10/24/2022)     OnabotulinumtoxinA (BOTOX IM) Inject 1 Dose into the muscle every 3 (three) months. FOR MIGRAINES     ondansetron (ZOFRAN) 4 MG tablet Take 1 tablet (4 mg total) by mouth every 6 (six) hours as needed for nausea. 20 tablet 0   oxyCODONE (OXY IR/ROXICODONE) 5 MG immediate release tablet Take 1-2 tablets (5-10 mg total) by mouth every 6 (six) hours as needed for severe pain. (Patient not taking: Reported on 01/06/2023) 42 tablet 0   rizatriptan (MAXALT-MLT) 10 MG disintegrating tablet Take 10 mg by mouth as needed for migraine.      Tetrahydrozoline HCl (VISINE OP) Place 1 drop into both eyes daily as needed (irritation).     thyroid (ARMOUR) 30 MG tablet Take 30 mg by mouth daily before breakfast.      UBRELVY 50 MG TABS Take 50 mg by mouth daily as needed (migraine).     Vilazodone HCl 20 MG TABS Take 1.5 tablets (30 mg total) by mouth daily with breakfast. 135 tablet 1   No current  facility-administered medications for this visit.   Facility-Administered Medications Ordered in Other Visits  Medication Dose Route Frequency Provider Last Rate Last Admin   ondansetron (ZOFRAN) 4 mg in sodium chloride 0.9 % 50 mL IVPB  4 mg Intravenous Q6H PRN Barnett Abu, MD        Medication Side Effects: None  Allergies:  Allergies  Allergen Reactions   Effexor [Venlafaxine Hcl]     Weight loss and cant sleep   Effexor [Venlafaxine] Other (See Comments)    Other Reaction(s): anxiety/ extreme weight loss   Flexeril [Cyclobenzaprine] Hives and Other (See Comments)   Linzess [Linaclotide] Diarrhea    Past Medical History:  Diagnosis Date   Arthritis    Back pain of lumbar region with sciatica    Cancer (HCC)    breast   Depression    Diabetes  mellitus, type II (HCC)    Family history of anesthesia complication    grandfather died under anesthesia 22's- had heart issues   H/O carpal tunnel repair 2014   Headache(784.0)    Hyperlipidemia    Hypertension    Hypothyroidism    Medial meniscus tear    PONV (postoperative nausea and vomiting)    Sleep apnea    cpap since 10    Past Medical History, Surgical history, Social history, and Family history were reviewed and updated as appropriate.   Please see review of systems for further details on the patient's review from today.   Objective:   Physical Exam:  Wt 203 lb (92.1 kg)   LMP 11/12/2013   BMI 35.96 kg/m   Physical Exam Constitutional:      General: She is not in acute distress. Musculoskeletal:        General: No deformity.  Neurological:     Mental Status: She is alert and oriented to person, place, and time.     Coordination: Coordination normal.  Psychiatric:        Attention and Perception: Attention and perception normal. She does not perceive auditory or visual hallucinations.        Mood and Affect: Mood is anxious. Mood is not depressed. Affect is not labile, blunt, angry or inappropriate.         Speech: Speech normal.        Behavior: Behavior normal.        Thought Content: Thought content normal. Thought content is not paranoid or delusional. Thought content does not include homicidal or suicidal ideation. Thought content does not include homicidal or suicidal plan.        Cognition and Memory: Cognition and memory normal.        Judgment: Judgment normal.     Comments: Insight intact     Lab Review:     Component Value Date/Time   NA 136 09/10/2022 0326   K 3.7 09/10/2022 0326   CL 104 09/10/2022 0326   CO2 23 09/10/2022 0326   GLUCOSE 163 (H) 09/10/2022 0326   BUN 12 09/10/2022 0326   CREATININE 0.57 09/10/2022 0326   CREATININE 0.73 01/19/2018 1556   CALCIUM 8.5 (L) 09/10/2022 0326   PROT 6.8 01/19/2018 1556   ALBUMIN 3.6 01/19/2018 1556   AST 15 01/19/2018 1556   ALT 15 01/19/2018 1556   ALKPHOS 53 01/19/2018 1556   BILITOT 0.3 01/19/2018 1556   GFRNONAA >60 09/10/2022 0326   GFRNONAA >60 01/19/2018 1556   GFRAA >60 01/19/2018 1556       Component Value Date/Time   WBC 12.6 (H) 09/10/2022 0326   RBC 4.54 09/10/2022 0326   HGB 12.8 09/10/2022 0326   HGB 12.2 01/19/2018 1556   HCT 40.0 09/10/2022 0326   PLT 203 09/10/2022 0326   PLT 263 01/19/2018 1556   MCV 88.1 09/10/2022 0326   MCH 28.2 09/10/2022 0326   MCHC 32.0 09/10/2022 0326   RDW 13.7 09/10/2022 0326   LYMPHSABS 2.3 01/19/2018 1556   MONOABS 0.6 01/19/2018 1556   EOSABS 0.6 (H) 01/19/2018 1556   BASOSABS 0.1 01/19/2018 1556    No results found for: "POCLITH", "LITHIUM"   No results found for: "PHENYTOIN", "PHENOBARB", "VALPROATE", "CBMZ"   .res Assessment: Plan:   45 minutes spent dedicated to the care of this patient on the date of this encounter to include pre-visit review of records, ordering of medication, post visit documentation, and face-to-face time with  the patient discussing treatment options for anxiety and depression, including potential benefits, risks, and side effects  of increasing Vilazodone to 30 mg daily. Pt agrees to increase in Vilazodone.  Also discussed plan of care since provider will be leaving the practice.  Will continue Gabapentin 100 mg twice daily for anxiety and 300 mg at bedtime for anxiety and insomnia.  Continue Doxazosin 4 mg at bedtime for nightmares.  Continue Dayvigo 10 mg at bedtime for insomnia. Continue Alprazolam 0.5 mg 1/2-1 tablet three times daily as needed for anxiety or insomnia.  Recommend continuing psychotherapy.  Patient advised to contact office with any questions, adverse effects, or acute worsening in signs and symptoms.   [name] was seen today for depression, anxiety and follow-up.  Diagnoses and all orders for this visit:  Insomnia, unspecified type -     gabapentin (NEURONTIN) 100 MG capsule; Take 1 capsule (100 mg total) by mouth 2 (two) times daily as needed (anxiety). -     gabapentin (NEURONTIN) 300 MG capsule; Take 1 capsule (300 mg total) by mouth at bedtime. -     doxazosin (CARDURA) 4 MG tablet; Take 1 tablet (4 mg total) by mouth at bedtime. -     Lemborexant (DAYVIGO) 10 MG TABS; Take 1 tablet (10 mg total) by mouth at bedtime.  Generalized anxiety disorder -     gabapentin (NEURONTIN) 100 MG capsule; Take 1 capsule (100 mg total) by mouth 2 (two) times daily as needed (anxiety). -     gabapentin (NEURONTIN) 300 MG capsule; Take 1 capsule (300 mg total) by mouth at bedtime. -     Vilazodone HCl 20 MG TABS; Take 1.5 tablets (30 mg total) by mouth daily with breakfast. -     ALPRAZolam (XANAX) 0.5 MG tablet; Take 1/2-1 tablet three times daily as needed for anxiety or insomnia  PTSD (post-traumatic stress disorder) -     gabapentin (NEURONTIN) 300 MG capsule; Take 1 capsule (300 mg total) by mouth at bedtime. -     Vilazodone HCl 20 MG TABS; Take 1.5 tablets (30 mg total) by mouth daily with breakfast. -     ALPRAZolam (XANAX) 0.5 MG tablet; Take 1/2-1 tablet three times daily as needed for anxiety or  insomnia  Depression, unspecified depression type -     Vilazodone HCl 20 MG TABS; Take 1.5 tablets (30 mg total) by mouth daily with breakfast.     Please see After Visit Summary for patient specific instructions.  Future Appointments  Date Time Provider Department Center  01/09/2023  5:00 PM Jearld Lesch, PT [facility]  01/13/2023  5:00 PM Jearld Lesch, PT [facility]  01/16/2023  5:00 PM Jearld Lesch, PT [facility]  01/20/2023  5:00 PM Jearld Lesch, PT [facility]  01/21/2023  4:00 PM Gaspar Bidding, [facility] None  01/21/2023  5:00 PM Jearld Lesch, PT [facility]  02/04/2023  4:00 PM Gaspar Bidding, [facility] None  02/18/2023  4:00 PM Gaspar Bidding, [facility] None  03/04/2023  4:00 PM Gaspar Bidding [facility] None  03/18/2023  4:00 PM Gaspar Bidding [facility] None  04/01/2023  4:00 PM Gaspar Bidding, [facility] None  11/04/2023  2:15 PM Butch Penny, NP [facility] None    No orders of the defined types were placed in this encounter.   -------------------------------

## 2023-01-06 NOTE — Therapy (Signed)
OUTPATIENT PHYSICAL THERAPY LOWER EXTREMITY TREATMENT     Patient Name: [name] MRN: [id number] DOB:06/06/1960, 62 y.o., female Today's Date: 01/06/2023  END OF SESSION:  PT End of Session - 01/06/23 1740     Visit Number 45    Date for PT Re-Evaluation 01/31/23    Authorization Type [employer]    PT Start Time 1659    PT Stop Time 1743    PT Time Calculation (min) 44 min    Activity Tolerance Patient tolerated treatment well    Behavior During Therapy WFL for tasks assessed/performed             Past Medical History:  Diagnosis Date   Arthritis    Back pain of lumbar region with sciatica    Cancer (HCC)    breast   Depression    Diabetes mellitus, type II (HCC)    Family history of anesthesia complication    grandfather died under anesthesia 70's- had heart issues   H/O carpal tunnel repair 2014   Headache(784.0)    Hyperlipidemia    Hypertension    Hypothyroidism    Medial meniscus tear    PONV (postoperative nausea and vomiting)    Sleep apnea    cpap since 10   Past Surgical History:  Procedure Laterality Date   BILATERAL TOTAL MASTECTOMY WITH AXILLARY LYMPH NODE DISSECTION     BREAST RECONSTRUCTION     CERVICAL DISC ARTHROPLASTY N/A 03/19/2013   Procedure: CERVICAL FIVE TO SIX, CERVICAL SIX TO SEVEN CERVICAL ANTERIOR DISC ARTHROPLASTY;  Surgeon: Barnett Abu, MD;  Location: [facility];  Service: Neurosurgery;  Laterality: N/A;  C56 C67 artificial disc replacement   CESAREAN SECTION     COLONOSCOPY     DILATATION & CURETTAGE/HYSTEROSCOPY WITH TRUECLEAR N/A 11/25/2013   Procedure: DILATATION & CURETTAGE/HYSTEROSCOPY WITH TRUCLEAR;  Surgeon: Meriel Pica, MD;  Location: [facility];  Service: Gynecology;  Laterality: N/A;   ECTOPIC PREGNANCY SURGERY     GALLBLADDER SURGERY     MOUTH SURGERY     child- dog bite   SPINAL FUSION     2023   STERIOD INJECTION Left 09/09/2022   Procedure: LEFT KNEE STEROID INJECTION;  Surgeon: Ollen Gross, MD;   Location: [facility];  Service: Orthopedics;  Laterality: Left;  left knee injection 0928   TONSILLECTOMY     TOTAL KNEE ARTHROPLASTY Right 09/09/2022   Procedure: TOTAL KNEE ARTHROPLASTY;  Surgeon: Ollen Gross, MD;  Location: [facility];  Service: Orthopedics;  Laterality: Right;   TUBAL LIGATION     Patient Active Problem List   Diagnosis Date Noted   OA (osteoarthritis) of knee 09/09/2022   Exertional dyspnea 04/17/2022   Elevated coronary artery calcium score 04/17/2022   Spondylolisthesis at L4-L5 level 12/27/2021   Ductal carcinoma in situ (DCIS) of left breast 01/19/2018   Generalized anxiety disorder 11/07/2017   Depression 11/07/2017   Insomnia 11/07/2017   Cervical spondylosis 03/19/2013    PCP: Creola Corn  REFERRING PROVIDER: Trudee Grip  REFERRING DIAG: R TKA 09/09/22  THERAPY DIAG:  S/P total knee arthroplasty, right  Acute pain of right knee  Stiffness of right knee, not elsewhere classified  Muscle weakness (generalized)  Localized edema  Rationale for Evaluation and Treatment: Rehabilitation  ONSET DATE: 09/09/22  SUBJECTIVE:   SUBJECTIVE STATEMENT :  Patient reports that she got two vaccinations on Friday, she reports that she did not feel well after that and really has not done much stretches, she also reports  a lot of stress in her life right now.  She fears that she is getting tight again, still not taking oxy  PERTINENT HISTORY: R TKA 8/19 L knee steroid injection 09/09/22  PAIN:  Are you having pain? Yes: NPRS scale: 4/10 Pain location: R knee Pain description: sharp medial R knee  Aggravating factors: weight bearing on the RLE, bending, moving it  Relieving factors: ice helps some, pain meds  PRECAUTIONS: None  RED FLAGS: None   WEIGHT BEARING RESTRICTIONS: No  FALLS:  Has patient fallen in last 6 months? No  LIVING ENVIRONMENT: Lives with: lives with their family and lives with their spouse Lives in: House/apartment Stairs: Yes:  External: 3 steps; none Has following equipment at home: Single point cane and Walker - 2 wheeled  OCCUPATION: Not working  PLOF: Independent  PATIENT GOALS: No pain, to keep the knee moving  NEXT MD VISIT: 09/24/22  OBJECTIVE:   PATIENT SURVEYS:  FOTO 4  COGNITION: Overall cognitive status: Within functional limits for tasks assessed      EDEMA:  Increased swelling surrounding R knee  POSTURE: rounded shoulders and flexed trunk   PALPATION: Warm and TTP  LOWER EXTREMITY ROM:  Active ROM Right eval Left eval Right 09/24/22 Right 09/26/22 Right 10/02/22  11/01/22 12/09/22  Hip flexion          Hip extension          Hip abduction          Hip adduction          Hip internal rotation          Hip external rotation          Knee flexion  60  75* AROM, 86 PROM 82* AROM seated per pt request  78* self AAROM, 76* AROM  PROM 87 AROM 81 PROM 90 AROM 83 AROM 101 PROM 105  Knee extension -30  -15* AROM seated, -15 PROM supine   -16* supine heel prop with quad set  AROM 25 PROM 16 AROM 14 AROM 5 PROM 0  Ankle dorsiflexion          Ankle plantarflexion          Ankle inversion          Ankle eversion           (Blank rows = not tested)  LOWER EXTREMITY MMT:  MMT Right eval Left eval  Hip flexion 2   Hip extension    Hip abduction    Hip adduction    Hip internal rotation    Hip external rotation    Knee flexion 2   Knee extension 2   Ankle dorsiflexion    Ankle plantarflexion    Ankle inversion    Ankle eversion     (Blank rows = not tested)   FUNCTIONAL TESTS:  5 times sit to stand: unable to do  Timed up and go (TUG): 1 min 28s  GAIT: Distance walked: in clinic distances Assistive device utilized: Environmental consultant - 2 wheeled Level of assistance: SBA Comments: antalgic gait, slowed cadence, step to pattern, decreased step length and time on RLE    TODAY'S TREATMENT:  DATE:  01/06/23 Nustep level 4 x 6 minutes 5# LAQ 5# standing HS curls 5# hip abduction Feet on ball bridges, K2C PROM in supine and sitting PROM to 115 degrees but took some time to get there  01/01/23 Nustep level 5 x 6 minutes LE only Bike level 2 x 6 minutes full revolutions Resisted gait all directions, 6" step up forward with CGA, 20# Fast walking fwd and back ward Leg curls 20# 2x10 Tmill push Leg extension left only 5# Leg press 20# position #1  Passive stretch flexion  Passive to 115 and AROM to 105 degrees  12/30/22 Bike full revolutions with trunk lean x 7 minutes Right leg curls 20# 2x10 Right leg extension 5# 2x10 Leg press 20# going to position #1 today 5# right leg extension, abduction, SLR and hip flexion Passive stretch with her in prone and then in sitting continuing to push the ROM  12/25/22 Nustep level 5 x 6 minutes Bike full revolutions x 5 minutes LEg curls 20# Leg extensoin 5# with passive flexion b/n Resisted gait 40# all directions Leg press down to position #2 working on flexion Passive stretch, some contract/relax  12/23/22 Nustep level 5 x 5 minutes 8" lunge stretch Bike partial and full revolutions Leg press 20# working on flexion Fast walking Backward walking Passive stretch to 109 degrees flexion  12/17/22 NuStep L5x31mins  Progress note- recheck ROM PROM working on knee flexion Supine hip flexor stretch 30s x2 Prone quad stretch 30s x2  Leg press 20# TLE 3x10 seat 4,3,2 Calf raises on leg press 20# 2x12  12/16/22 NuStep L5x24mins  Bike L3 x67mins  Resisted gait 20# 4 way x4  Calf stretch 15s x2  Calf raises 2x12 Leg ext 10# 2x10, RLE 5# x10 HS curls 25# 2x10, RLE 15# x10    12/13/22 NuStep L5 x24mins LE  Bike full revs x37mins with leaning  Step ups 6" Lateral step ups 6" Step overs 4" Lateral heel taps 4"  Leg press 20# working on flexion, seat 2 PROM to R knee focusing on  flexion  12/11/22 Nustep level 5 x 5 minutes Bike full revolutions with cheating x 5 minutes LEg extension 10# with passive extension LEg press 20# working on flexion PROM really focus on flexion  12/09/22 Gait outside around parking [place] no device good speed Nustep level 5 x 5 minutes Lunge step stretch LEg press working on flexion PROM to the right knee biggest focus on flexion but also did extension  12/06/22 Recheck goals to recert Passive stretching of R knee  NuStep L5 x36mins LE  Bike L1 partial revs x26mins  Step ups 6" Squats to 8" on treadmill 2x10  Leg press 40# 2x10 seat 2  PATIENT EDUCATION:  Education details: POC and HEP Person educated: Patient Education method: Explanation Education comprehension: verbalized understanding  HOME EXERCISE PROGRAM: Access Code: [id number]  ASSESSMENT:  CLINICAL IMPRESSION:  Patient reports that she did not feel well this weekend due to getting vaccines on Friday, she reports very stiff because she did not do much.  I was able to push her to 115 degrees flexion, she did have some pain today, and was able to get to 105 degrees on her own.  She is still not taking oxy and is only using Tramadol, she also has a lot of stress going on in her life right now OBJECTIVE IMPAIRMENTS: Abnormal gait, difficulty walking, decreased ROM, decreased strength, and pain.   ACTIVITY LIMITATIONS: bending, sitting, standing, squatting, sleeping, stairs, transfers,  bathing, toileting, and locomotion level  PARTICIPATION LIMITATIONS: meal prep, cleaning, laundry, driving, shopping, and community activity  REHAB POTENTIAL: Good  CLINICAL DECISION MAKING: Stable/uncomplicated  EVALUATION COMPLEXITY: Low   GOALS: Goals reviewed with patient? Yes  SHORT TERM GOALS: Target date: 10/24/22  Patient will be independent with initial HEP. Goal status: 09/20/22 met  2.  Patient will be do TUG < 30s Baseline: 1 min 28s  Goal status: ongoing  10/07/22, 18.57s MET 10/09/22  3.  Patient will be able to do 5 sit to stands  Baseline: unable to do  Goal status: progressing 10/04/22, MET 10/09/22   LONG TERM GOALS: Target date: 01/31/23  Patient will be independent with advanced/ongoing HEP to improve outcomes and carryover.  Goal status: ongoing 11/06/22  2.  Patient will report at least 75% improvement in R knee pain to improve QOL. Baseline: 10/10 Goal status: IN PROGRESS  60% better 11/20/22, 65% 12/06/22, 70% 12/17/22  3.  Patient will demonstrate improved R knee AROM to >/= 0-120 deg to allow for normal gait and stair mechanics. Baseline: 30-0-60 10/09/22: 16-0-78  11/01/22: 14-0-83 11/20/22: 10-97 12/06/22: 10-0-100 12/17/22: 8-0-104 Goal status: IN PROGRESS  4.  Patient will demonstrate improved functional LE strength as demonstrated by 5/5. Baseline: 2/5 Goal status: MET 3+/5 10/09/22, 5/5 11/01/22   5.  Patient will be able to ambulate 500' with LRAD and normal gait pattern without increased pain to access community.  Baseline: using RW small in home ambulation distances Goal status: progressing w/SPC 12/02/22, doing some walking without AD   6.  Patient will report 70 on FOTO (patient reported outcome measure) to demonstrate improved functional ability. Baseline: 4, 40-11/11/24 Goal status: MET 53 12/17/22  PLAN:  PT FREQUENCY: 2x/week  PT DURATION: 12 weeks  PLANNED INTERVENTIONS: Therapeutic exercises, Therapeutic activity, Neuromuscular re-education, Balance training, Gait training, Patient/Family education, Self Care, Joint mobilization, Stair training, Electrical stimulation, Cryotherapy, Moist heat, scar mobilization, Vasopneumatic device, and Manual therapy  PLAN FOR NEXT SESSION: continue to push the ROM, she will see MD Thursday  Patient Details  Name: LEETTA ELLENDER MRN: [id number] Date of Birth: 12/07/1960 Referring Provider:  Ollen Gross, MD  Encounter Date:  01/06/2023   Jearld Lesch, PT 01/06/2023, 5:41 PM

## 2023-01-08 ENCOUNTER — Ambulatory Visit: Payer: No Typology Code available for payment source | Admitting: Physical Therapy

## 2023-01-09 ENCOUNTER — Ambulatory Visit: Payer: No Typology Code available for payment source | Admitting: Physical Therapy

## 2023-01-09 ENCOUNTER — Encounter: Payer: Self-pay | Admitting: Physical Therapy

## 2023-01-09 DIAGNOSIS — Z96651 Presence of right artificial knee joint: Secondary | ICD-10-CM | POA: Diagnosis not present

## 2023-01-09 DIAGNOSIS — M25561 Pain in right knee: Secondary | ICD-10-CM

## 2023-01-09 DIAGNOSIS — M25661 Stiffness of right knee, not elsewhere classified: Secondary | ICD-10-CM

## 2023-01-09 DIAGNOSIS — R600 Localized edema: Secondary | ICD-10-CM

## 2023-01-09 DIAGNOSIS — R2689 Other abnormalities of gait and mobility: Secondary | ICD-10-CM

## 2023-01-09 DIAGNOSIS — M6281 Muscle weakness (generalized): Secondary | ICD-10-CM

## 2023-01-09 NOTE — Therapy (Signed)
OUTPATIENT PHYSICAL THERAPY LOWER EXTREMITY TREATMENT     Patient Name: [name] MRN: [id number] DOB:1960-09-23, 62 y.o., female Today's Date: 01/09/2023  END OF SESSION:  PT End of Session - 01/09/23 1704     Visit Number 46    Date for PT Re-Evaluation 01/31/23    Authorization Type [employer]    PT Start Time 1656    PT Stop Time 1742    PT Time Calculation (min) 46 min    Activity Tolerance Patient tolerated treatment well    Behavior During Therapy WFL for tasks assessed/performed             Past Medical History:  Diagnosis Date   Arthritis    Back pain of lumbar region with sciatica    Cancer (HCC)    breast   Depression    Diabetes mellitus, type II (HCC)    Family history of anesthesia complication    grandfather died under anesthesia 70's- had heart issues   H/O carpal tunnel repair 2014   Headache(784.0)    Hyperlipidemia    Hypertension    Hypothyroidism    Medial meniscus tear    PONV (postoperative nausea and vomiting)    Sleep apnea    cpap since 10   Past Surgical History:  Procedure Laterality Date   BILATERAL TOTAL MASTECTOMY WITH AXILLARY LYMPH NODE DISSECTION     BREAST RECONSTRUCTION     CERVICAL DISC ARTHROPLASTY N/A 03/19/2013   Procedure: CERVICAL FIVE TO SIX, CERVICAL SIX TO SEVEN CERVICAL ANTERIOR DISC ARTHROPLASTY;  Surgeon: Barnett Abu, MD;  Location: [facility];  Service: Neurosurgery;  Laterality: N/A;  C56 C67 artificial disc replacement   CESAREAN SECTION     COLONOSCOPY     DILATATION & CURETTAGE/HYSTEROSCOPY WITH TRUECLEAR N/A 11/25/2013   Procedure: DILATATION & CURETTAGE/HYSTEROSCOPY WITH TRUCLEAR;  Surgeon: Meriel Pica, MD;  Location: [facility];  Service: Gynecology;  Laterality: N/A;   ECTOPIC PREGNANCY SURGERY     GALLBLADDER SURGERY     MOUTH SURGERY     child- dog bite   SPINAL FUSION     2023   STERIOD INJECTION Left 09/09/2022   Procedure: LEFT KNEE STEROID INJECTION;  Surgeon: Ollen Gross, MD;   Location: [facility];  Service: Orthopedics;  Laterality: Left;  left knee injection 0928   TONSILLECTOMY     TOTAL KNEE ARTHROPLASTY Right 09/09/2022   Procedure: TOTAL KNEE ARTHROPLASTY;  Surgeon: Ollen Gross, MD;  Location: [facility];  Service: Orthopedics;  Laterality: Right;   TUBAL LIGATION     Patient Active Problem List   Diagnosis Date Noted   OA (osteoarthritis) of knee 09/09/2022   Exertional dyspnea 04/17/2022   Elevated coronary artery calcium score 04/17/2022   Spondylolisthesis at L4-L5 level 12/27/2021   Ductal carcinoma in situ (DCIS) of left breast 01/19/2018   Generalized anxiety disorder 11/07/2017   Depression 11/07/2017   Insomnia 11/07/2017   Cervical spondylosis 03/19/2013    PCP: Creola Corn  REFERRING PROVIDER: Trudee Grip  REFERRING DIAG: R TKA 09/09/22  THERAPY DIAG:  S/P total knee arthroplasty, right  Acute pain of right knee  Stiffness of right knee, not elsewhere classified  Muscle weakness (generalized)  Localized edema  Other abnormalities of gait and mobility  Rationale for Evaluation and Treatment: Rehabilitation  ONSET DATE: 09/09/22  SUBJECTIVE:   SUBJECTIVE STATEMENT :  Patient saw the MD today, feels like it is slowly improving, they want PT to see her another month and then take  a month break and see how she is doing , reports that she is a little sore from stretching today  PERTINENT HISTORY: R TKA 8/19 L knee steroid injection 09/09/22  PAIN:  Are you having pain? Yes: NPRS scale: 6/10 Pain location: R knee Pain description: sharp medial R knee  Aggravating factors: weight bearing on the RLE, bending, moving it  Relieving factors: ice helps some, pain meds  PRECAUTIONS: None  RED FLAGS: None   WEIGHT BEARING RESTRICTIONS: No  FALLS:  Has patient fallen in last 6 months? No  LIVING ENVIRONMENT: Lives with: lives with their family and lives with their spouse Lives in: House/apartment Stairs: Yes: External: 3  steps; none Has following equipment at home: Single point cane and Walker - 2 wheeled  OCCUPATION: Not working  PLOF: Independent  PATIENT GOALS: No pain, to keep the knee moving  NEXT MD VISIT: 09/24/22  OBJECTIVE:   PATIENT SURVEYS:  FOTO 4  COGNITION: Overall cognitive status: Within functional limits for tasks assessed      EDEMA:  Increased swelling surrounding R knee  POSTURE: rounded shoulders and flexed trunk   PALPATION: Warm and TTP  LOWER EXTREMITY ROM:  Active ROM Right eval Left eval Right 09/24/22 Right 09/26/22 Right 10/02/22  11/01/22 12/09/22  Hip flexion          Hip extension          Hip abduction          Hip adduction          Hip internal rotation          Hip external rotation          Knee flexion  60  75* AROM, 86 PROM 82* AROM seated per pt request  78* self AAROM, 76* AROM  PROM 87 AROM 81 PROM 90 AROM 83 AROM 101 PROM 105  Knee extension -30  -15* AROM seated, -15 PROM supine   -16* supine heel prop with quad set  AROM 25 PROM 16 AROM 14 AROM 5 PROM 0  Ankle dorsiflexion          Ankle plantarflexion          Ankle inversion          Ankle eversion           (Blank rows = not tested)  LOWER EXTREMITY MMT:  MMT Right eval Left eval  Hip flexion 2   Hip extension    Hip abduction    Hip adduction    Hip internal rotation    Hip external rotation    Knee flexion 2   Knee extension 2   Ankle dorsiflexion    Ankle plantarflexion    Ankle inversion    Ankle eversion     (Blank rows = not tested)   FUNCTIONAL TESTS:  5 times sit to stand: unable to do  Timed up and go (TUG): 1 min 28s  GAIT: Distance walked: in clinic distances Assistive device utilized: Environmental consultant - 2 wheeled Level of assistance: SBA Comments: antalgic gait, slowed cadence, step to pattern, decreased step length and time on RLE    TODAY'S TREATMENT:  DATE:  01/09/23 Nustep level 5 x 5 minutes Bike level 3 x 5 minutes Leg curls 20# 2x10, then right only  5# leg extension  Leg press 20# working on flexion STM with the T-gun during Passive stretch flexion and extension  01/06/23 Nustep level 4 x 6 minutes 5# LAQ 5# standing HS curls 5# hip abduction Feet on ball bridges, K2C PROM in supine and sitting PROM to 115 degrees but took some time to get there  01/01/23 Nustep level 5 x 6 minutes LE only Bike level 2 x 6 minutes full revolutions Resisted gait all directions, 6" step up forward with CGA, 20# Fast walking fwd and back ward Leg curls 20# 2x10 Tmill push Leg extension left only 5# Leg press 20# position #1  Passive stretch flexion  Passive to 115 and AROM to 105 degrees  12/30/22 Bike full revolutions with trunk lean x 7 minutes Right leg curls 20# 2x10 Right leg extension 5# 2x10 Leg press 20# going to position #1 today 5# right leg extension, abduction, SLR and hip flexion Passive stretch with her in prone and then in sitting continuing to push the ROM  12/25/22 Nustep level 5 x 6 minutes Bike full revolutions x 5 minutes LEg curls 20# Leg extensoin 5# with passive flexion b/n Resisted gait 40# all directions Leg press down to position #2 working on flexion Passive stretch, some contract/relax  12/23/22 Nustep level 5 x 5 minutes 8" lunge stretch Bike partial and full revolutions Leg press 20# working on flexion Fast walking Backward walking Passive stretch to 109 degrees flexion  12/17/22 NuStep L5x46mins  Progress note- recheck ROM PROM working on knee flexion Supine hip flexor stretch 30s x2 Prone quad stretch 30s x2  Leg press 20# TLE 3x10 seat 4,3,2 Calf raises on leg press 20# 2x12  12/16/22 NuStep L5x19mins  Bike L3 x34mins  Resisted gait 20# 4 way x4  Calf stretch 15s x2  Calf raises 2x12 Leg ext 10# 2x10, RLE 5# x10 HS curls 25# 2x10, RLE 15# x10    12/13/22 NuStep  L5 x61mins LE  Bike full revs x103mins with leaning  Step ups 6" Lateral step ups 6" Step overs 4" Lateral heel taps 4"  Leg press 20# working on flexion, seat 2 PROM to R knee focusing on flexion  PATIENT EDUCATION:  Education details: POC and HEP Person educated: Patient Education method: Explanation Education comprehension: verbalized understanding  HOME EXERCISE PROGRAM: Access Code: [email or web address]  ASSESSMENT:  CLINICAL IMPRESSION:  Patient saw MD today, we are to see here through January, then take a month off with her trying on her own, and see how she does.   I was able to push her to 115 degrees flexion, she did have some pain today, and was able to get to 105 degrees on her own.  She is still not taking oxy and is only using Tramadol, she also has a lot of stress going on in her life right now OBJECTIVE IMPAIRMENTS: Abnormal gait, difficulty walking, decreased ROM, decreased strength, and pain.   ACTIVITY LIMITATIONS: bending, sitting, standing, squatting, sleeping, stairs, transfers, bathing, toileting, and locomotion level  PARTICIPATION LIMITATIONS: meal prep, cleaning, laundry, driving, shopping, and community activity  REHAB POTENTIAL: Good  CLINICAL DECISION MAKING: Stable/uncomplicated  EVALUATION COMPLEXITY: Low   GOALS: Goals reviewed with patient? Yes  SHORT TERM GOALS: Target date: 10/24/22  Patient will be independent with initial HEP. Goal status: 09/20/22 met  2.  Patient will be do TUG <  30s Baseline: 1 min 28s  Goal status: ongoing 10/07/22, 18.57s MET 10/09/22  3.  Patient will be able to do 5 sit to stands  Baseline: unable to do  Goal status: progressing 10/04/22, MET 10/09/22   LONG TERM GOALS: Target date: 01/31/23  Patient will be independent with advanced/ongoing HEP to improve outcomes and carryover.  Goal status: ongoing 11/06/22  2.  Patient will report at least 75% improvement in R knee pain to improve QOL. Baseline: 10/10 Goal status:  IN PROGRESS  60% better 11/20/22, 65% 12/06/22, 70% 12/17/22  3.  Patient will demonstrate improved R knee AROM to >/= 0-120 deg to allow for normal gait and stair mechanics. Baseline: 30-0-60 10/09/22: 16-0-78  11/01/22: 14-0-83 11/20/22: 10-97 12/06/22: 10-0-100 12/17/22: 8-0-104 Goal status: IN PROGRESS  4.  Patient will demonstrate improved functional LE strength as demonstrated by 5/5. Baseline: 2/5 Goal status: MET 3+/5 10/09/22, 5/5 11/01/22   5.  Patient will be able to ambulate 500' with LRAD and normal gait pattern without increased pain to access community.  Baseline: using RW small in home ambulation distances Goal status: progressing w/SPC 12/02/22, doing some walking without AD   6.  Patient will report 1 on FOTO (patient reported outcome measure) to demonstrate improved functional ability. Baseline: 4, 40-11/11/24 Goal status: MET 53 12/17/22  PLAN:  PT FREQUENCY: 2x/week  PT DURATION: 12 weeks  PLANNED INTERVENTIONS: Therapeutic exercises, Therapeutic activity, Neuromuscular re-education, Balance training, Gait training, Patient/Family education, Self Care, Joint mobilization, Stair training, Electrical stimulation, Cryotherapy, Moist heat, scar mobilization, Vasopneumatic device, and Manual therapy  PLAN FOR NEXT SESSION: continue to push the ROM, she will see MD Thursday  Patient Details  Name: [name] MRN: [id number] Date of Birth: 21-Mar-1960 Referring Provider:  Ollen Gross, MD  Encounter Date: 01/09/2023   Jearld Lesch, PT 01/09/2023, 5:07 PM

## 2023-01-13 ENCOUNTER — Encounter: Payer: Self-pay | Admitting: Physical Therapy

## 2023-01-13 ENCOUNTER — Ambulatory Visit: Payer: No Typology Code available for payment source | Admitting: Physical Therapy

## 2023-01-13 DIAGNOSIS — R2689 Other abnormalities of gait and mobility: Secondary | ICD-10-CM

## 2023-01-13 DIAGNOSIS — M25661 Stiffness of right knee, not elsewhere classified: Secondary | ICD-10-CM

## 2023-01-13 DIAGNOSIS — R600 Localized edema: Secondary | ICD-10-CM

## 2023-01-13 DIAGNOSIS — Z96651 Presence of right artificial knee joint: Secondary | ICD-10-CM | POA: Diagnosis not present

## 2023-01-13 DIAGNOSIS — M25561 Pain in right knee: Secondary | ICD-10-CM

## 2023-01-13 DIAGNOSIS — M6281 Muscle weakness (generalized): Secondary | ICD-10-CM

## 2023-01-13 NOTE — Therapy (Signed)
OUTPATIENT PHYSICAL THERAPY LOWER EXTREMITY TREATMENT     Patient Name: [name] MRN: [id number] DOB:22-Jun-1960, 62 y.o., female Today's Date: 01/13/2023  END OF SESSION:  PT End of Session - 01/13/23 1701     Visit Number 47    Date for PT Re-Evaluation 01/31/23    Authorization Type [employer]    PT Start Time 1700    PT Stop Time 1745    PT Time Calculation (min) 45 min    Activity Tolerance Patient tolerated treatment well    Behavior During Therapy WFL for tasks assessed/performed             Past Medical History:  Diagnosis Date   Arthritis    Back pain of lumbar region with sciatica    Cancer (HCC)    breast   Depression    Diabetes mellitus, type II (HCC)    Family history of anesthesia complication    grandfather died under anesthesia 70's- had heart issues   H/O carpal tunnel repair 2014   Headache(784.0)    Hyperlipidemia    Hypertension    Hypothyroidism    Medial meniscus tear    PONV (postoperative nausea and vomiting)    Sleep apnea    cpap since 10   Past Surgical History:  Procedure Laterality Date   BILATERAL TOTAL MASTECTOMY WITH AXILLARY LYMPH NODE DISSECTION     BREAST RECONSTRUCTION     CERVICAL DISC ARTHROPLASTY N/A 03/19/2013   Procedure: CERVICAL FIVE TO SIX, CERVICAL SIX TO SEVEN CERVICAL ANTERIOR DISC ARTHROPLASTY;  Surgeon: Barnett Abu, MD;  Location: [facility];  Service: Neurosurgery;  Laterality: N/A;  C56 C67 artificial disc replacement   CESAREAN SECTION     COLONOSCOPY     DILATATION & CURETTAGE/HYSTEROSCOPY WITH TRUECLEAR N/A 11/25/2013   Procedure: DILATATION & CURETTAGE/HYSTEROSCOPY WITH TRUCLEAR;  Surgeon: Meriel Pica, MD;  Location: [facility];  Service: Gynecology;  Laterality: N/A;   ECTOPIC PREGNANCY SURGERY     GALLBLADDER SURGERY     MOUTH SURGERY     child- dog bite   SPINAL FUSION     2023   STERIOD INJECTION Left 09/09/2022   Procedure: LEFT KNEE STEROID INJECTION;  Surgeon: Ollen Gross, MD;   Location: [facility];  Service: Orthopedics;  Laterality: Left;  left knee injection 0928   TONSILLECTOMY     TOTAL KNEE ARTHROPLASTY Right 09/09/2022   Procedure: TOTAL KNEE ARTHROPLASTY;  Surgeon: Ollen Gross, MD;  Location: [facility];  Service: Orthopedics;  Laterality: Right;   TUBAL LIGATION     Patient Active Problem List   Diagnosis Date Noted   OA (osteoarthritis) of knee 09/09/2022   Exertional dyspnea 04/17/2022   Elevated coronary artery calcium score 04/17/2022   Spondylolisthesis at L4-L5 level 12/27/2021   Ductal carcinoma in situ (DCIS) of left breast 01/19/2018   Generalized anxiety disorder 11/07/2017   Depression 11/07/2017   Insomnia 11/07/2017   Cervical spondylosis 03/19/2013    PCP: Creola Corn  REFERRING PROVIDER: Trudee Grip  REFERRING DIAG: R TKA 09/09/22  THERAPY DIAG:  S/P total knee arthroplasty, right  Acute pain of right knee  Stiffness of right knee, not elsewhere classified  Muscle weakness (generalized)  Localized edema  Other abnormalities of gait and mobility  Rationale for Evaluation and Treatment: Rehabilitation  ONSET DATE: 09/09/22  SUBJECTIVE:   SUBJECTIVE STATEMENT :  Patient reports that she has been up on her feet a lot and is sore and has not slept well due to  soreness, reports very stiff after sitting for long periods  PERTINENT HISTORY: R TKA 8/19 L knee steroid injection 09/09/22  PAIN:  Are you having pain? Yes: NPRS scale: 6/10 Pain location: R knee Pain description: sharp medial R knee  Aggravating factors: weight bearing on the RLE, bending, moving it  Relieving factors: ice helps some, pain meds  PRECAUTIONS: None  RED FLAGS: None   WEIGHT BEARING RESTRICTIONS: No  FALLS:  Has patient fallen in last 6 months? No  LIVING ENVIRONMENT: Lives with: lives with their family and lives with their spouse Lives in: House/apartment Stairs: Yes: External: 3 steps; none Has following equipment at home: Single point  cane and Walker - 2 wheeled  OCCUPATION: Not working  PLOF: Independent  PATIENT GOALS: No pain, to keep the knee moving  NEXT MD VISIT: 09/24/22  OBJECTIVE:   PATIENT SURVEYS:  FOTO 4  COGNITION: Overall cognitive status: Within functional limits for tasks assessed      EDEMA:  Increased swelling surrounding R knee  POSTURE: rounded shoulders and flexed trunk   PALPATION: Warm and TTP  LOWER EXTREMITY ROM:  Active ROM Right eval Left eval Right 09/24/22 Right 09/26/22 Right 10/02/22  11/01/22 12/09/22  Hip flexion          Hip extension          Hip abduction          Hip adduction          Hip internal rotation          Hip external rotation          Knee flexion  60  75* AROM, 86 PROM 82* AROM seated per pt request  78* self AAROM, 76* AROM  PROM 87 AROM 81 PROM 90 AROM 83 AROM 101 PROM 105  Knee extension -30  -15* AROM seated, -15 PROM supine   -16* supine heel prop with quad set  AROM 25 PROM 16 AROM 14 AROM 5 PROM 0  Ankle dorsiflexion          Ankle plantarflexion          Ankle inversion          Ankle eversion           (Blank rows = not tested)  LOWER EXTREMITY MMT:  MMT Right eval Left eval  Hip flexion 2   Hip extension    Hip abduction    Hip adduction    Hip internal rotation    Hip external rotation    Knee flexion 2   Knee extension 2   Ankle dorsiflexion    Ankle plantarflexion    Ankle inversion    Ankle eversion     (Blank rows = not tested)   FUNCTIONAL TESTS:  5 times sit to stand: unable to do  Timed up and go (TUG): 1 min 28s  GAIT: Distance walked: in clinic distances Assistive device utilized: Environmental consultant - 2 wheeled Level of assistance: SBA Comments: antalgic gait, slowed cadence, step to pattern, decreased step length and time on RLE    TODAY'S TREATMENT:  DATE:  01/13/23 Nustep level 5 x 6  minutes Bike level 2 x 6 minutes Resisted gait 40# all directions Tmill push fwd and backward Leg press 20# position #1 Prone and supine passive knee flexion Use of the Tgun on the right thigh, she had increased soreness and pain in the thigh today  01/09/23 Nustep level 5 x 5 minutes Bike level 3 x 5 minutes Leg curls 20# 2x10, then right only  5# leg extension  Leg press 20# working on flexion STM with the T-gun during Passive stretch flexion and extension  01/06/23 Nustep level 4 x 6 minutes 5# LAQ 5# standing HS curls 5# hip abduction Feet on ball bridges, K2C PROM in supine and sitting PROM to 115 degrees but took some time to get there  01/01/23 Nustep level 5 x 6 minutes LE only Bike level 2 x 6 minutes full revolutions Resisted gait all directions, 6" step up forward with CGA, 20# Fast walking fwd and back ward Leg curls 20# 2x10 Tmill push Leg extension left only 5# Leg press 20# position #1  Passive stretch flexion  Passive to 115 and AROM to 105 degrees  12/30/22 Bike full revolutions with trunk lean x 7 minutes Right leg curls 20# 2x10 Right leg extension 5# 2x10 Leg press 20# going to position #1 today 5# right leg extension, abduction, SLR and hip flexion Passive stretch with her in prone and then in sitting continuing to push the ROM  12/25/22 Nustep level 5 x 6 minutes Bike full revolutions x 5 minutes LEg curls 20# Leg extensoin 5# with passive flexion b/n Resisted gait 40# all directions Leg press down to position #2 working on flexion Passive stretch, some contract/relax  12/23/22 Nustep level 5 x 5 minutes 8" lunge stretch Bike partial and full revolutions Leg press 20# working on flexion Fast walking Backward walking Passive stretch to 109 degrees flexion  12/17/22 NuStep L5x50mins  Progress note- recheck ROM PROM working on knee flexion Supine hip flexor stretch 30s x2 Prone quad stretch 30s x2  Leg press 20# TLE 3x10 seat  4,3,2 Calf raises on leg press 20# 2x12  12/16/22 NuStep L5x70mins  Bike L3 x59mins  Resisted gait 20# 4 way x4  Calf stretch 15s x2  Calf raises 2x12 Leg ext 10# 2x10, RLE 5# x10 HS curls 25# 2x10, RLE 15# x10    12/13/22 NuStep L5 x95mins LE  Bike full revs x49mins with leaning  Step ups 6" Lateral step ups 6" Step overs 4" Lateral heel taps 4"  Leg press 20# working on flexion, seat 2 PROM to R knee focusing on flexion  PATIENT EDUCATION:  Education details: POC and HEP Person educated: Patient Education method: Explanation Education comprehension: verbalized understanding  HOME EXERCISE PROGRAM: Access Code: [email or web address]  ASSESSMENT:  CLINICAL IMPRESSION:  Patient saw MD today, we are to see here through January, then take a month off with her trying on her own, and see how she does.   She has been very busy and on her feet more, she is reporting very stiff and had increased c/o soreness and tightness in the quad and around the patella today OBJECTIVE IMPAIRMENTS: Abnormal gait, difficulty walking, decreased ROM, decreased strength, and pain.   ACTIVITY LIMITATIONS: bending, sitting, standing, squatting, sleeping, stairs, transfers, bathing, toileting, and locomotion level  PARTICIPATION LIMITATIONS: meal prep, cleaning, laundry, driving, shopping, and community activity  REHAB POTENTIAL: Good  CLINICAL DECISION MAKING: Stable/uncomplicated  EVALUATION COMPLEXITY: Low   GOALS: Goals reviewed  with patient? Yes  SHORT TERM GOALS: Target date: 10/24/22  Patient will be independent with initial HEP. Goal status: 09/20/22 met  2.  Patient will be do TUG < 30s Baseline: 1 min 28s  Goal status: ongoing 10/07/22, 18.57s MET 10/09/22  3.  Patient will be able to do 5 sit to stands  Baseline: unable to do  Goal status: progressing 10/04/22, MET 10/09/22   LONG TERM GOALS: Target date: 01/31/23  Patient will be independent with advanced/ongoing HEP to improve outcomes  and carryover.  Goal status: ongoing 11/06/22  2.  Patient will report at least 75% improvement in R knee pain to improve QOL. Baseline: 10/10 Goal status: IN PROGRESS  60% better 11/20/22, 65% 12/06/22, 70% 12/17/22  3.  Patient will demonstrate improved R knee AROM to >/= 0-120 deg to allow for normal gait and stair mechanics. Baseline: 30-0-60 10/09/22: 16-0-78  11/01/22: 14-0-83 11/20/22: 10-97 12/06/22: 10-0-100 12/17/22: 8-0-104 Goal status: IN PROGRESS  4.  Patient will demonstrate improved functional LE strength as demonstrated by 5/5. Baseline: 2/5 Goal status: MET 3+/5 10/09/22, 5/5 11/01/22   5.  Patient will be able to ambulate 500' with LRAD and normal gait pattern without increased pain to access community.  Baseline: using RW small in home ambulation distances Goal status: progressing w/SPC 12/02/22, doing some walking without AD   6.  Patient will report 74 on FOTO (patient reported outcome measure) to demonstrate improved functional ability. Baseline: 4, 40-11/11/24 Goal status: MET 53 12/17/22  PLAN:  PT FREQUENCY: 2x/week  PT DURATION: 12 weeks  PLANNED INTERVENTIONS: Therapeutic exercises, Therapeutic activity, Neuromuscular re-education, Balance training, Gait training, Patient/Family education, Self Care, Joint mobilization, Stair training, Electrical stimulation, Cryotherapy, Moist heat, scar mobilization, Vasopneumatic device, and Manual therapy  PLAN FOR NEXT SESSION: continue to push the ROM start to teach her how to do the machines on her own  Patient Details  Name: JANINA HOFFERT MRN: [id number] Date of Birth: 1960-12-09 Referring Provider:  Ollen Gross, MD  Encounter Date: 01/13/2023   Jearld Lesch, PT 01/13/2023, 5:02 PM

## 2023-01-16 ENCOUNTER — Ambulatory Visit: Payer: No Typology Code available for payment source | Admitting: Physical Therapy

## 2023-01-17 ENCOUNTER — Encounter: Payer: Self-pay | Admitting: Physical Therapy

## 2023-01-17 ENCOUNTER — Ambulatory Visit: Payer: No Typology Code available for payment source | Admitting: Physical Therapy

## 2023-01-17 ENCOUNTER — Ambulatory Visit: Payer: No Typology Code available for payment source

## 2023-01-17 DIAGNOSIS — Z96651 Presence of right artificial knee joint: Secondary | ICD-10-CM | POA: Diagnosis not present

## 2023-01-17 DIAGNOSIS — M6281 Muscle weakness (generalized): Secondary | ICD-10-CM

## 2023-01-17 DIAGNOSIS — M25661 Stiffness of right knee, not elsewhere classified: Secondary | ICD-10-CM

## 2023-01-17 DIAGNOSIS — M25561 Pain in right knee: Secondary | ICD-10-CM

## 2023-01-17 DIAGNOSIS — R600 Localized edema: Secondary | ICD-10-CM

## 2023-01-17 DIAGNOSIS — R2689 Other abnormalities of gait and mobility: Secondary | ICD-10-CM

## 2023-01-17 NOTE — Therapy (Signed)
OUTPATIENT PHYSICAL THERAPY LOWER EXTREMITY TREATMENT     Patient Name: [name] MRN: [id number] DOB:[birth year], 62 y.o., female Today's Date: 01/17/2023  END OF SESSION:  PT End of Session - 01/17/23 1024     Visit Number 48    Date for PT Re-Evaluation 01/31/23    Authorization Type [employer]    PT Start Time 1015    PT Stop Time 1110    PT Time Calculation (min) 55 min    Activity Tolerance Patient tolerated treatment well    Behavior During Therapy WFL for tasks assessed/performed             Past Medical History:  Diagnosis Date   Arthritis    Back pain of lumbar region with sciatica    Cancer (HCC)    breast   Depression    Diabetes mellitus, type II (HCC)    Family history of anesthesia complication    grandfather died under anesthesia 70's- had heart issues   H/O carpal tunnel repair 2014   Headache(784.0)    Hyperlipidemia    Hypertension    Hypothyroidism    Medial meniscus tear    PONV (postoperative nausea and vomiting)    Sleep apnea    cpap since 10   Past Surgical History:  Procedure Laterality Date   BILATERAL TOTAL MASTECTOMY WITH AXILLARY LYMPH NODE DISSECTION     BREAST RECONSTRUCTION     CERVICAL DISC ARTHROPLASTY N/A 03/19/2013   Procedure: CERVICAL FIVE TO SIX, CERVICAL SIX TO SEVEN CERVICAL ANTERIOR DISC ARTHROPLASTY;  Surgeon: Barnett Abu, MD;  Location: [facility];  Service: Neurosurgery;  Laterality: N/A;  C56 C67 artificial disc replacement   CESAREAN SECTION     COLONOSCOPY     DILATATION & CURETTAGE/HYSTEROSCOPY WITH TRUECLEAR N/A 11/25/2013   Procedure: DILATATION & CURETTAGE/HYSTEROSCOPY WITH TRUCLEAR;  Surgeon: Meriel Pica, MD;  Location: [facility];  Service: Gynecology;  Laterality: N/A;   ECTOPIC PREGNANCY SURGERY     GALLBLADDER SURGERY     MOUTH SURGERY     child- dog bite   SPINAL FUSION     2023   STERIOD INJECTION Left 09/09/2022   Procedure: LEFT KNEE STEROID INJECTION;  Surgeon: Ollen Gross, MD;   Location: [facility];  Service: Orthopedics;  Laterality: Left;  left knee injection 0928   TONSILLECTOMY     TOTAL KNEE ARTHROPLASTY Right 09/09/2022   Procedure: TOTAL KNEE ARTHROPLASTY;  Surgeon: Ollen Gross, MD;  Location: [facility];  Service: Orthopedics;  Laterality: Right;   TUBAL LIGATION     Patient Active Problem List   Diagnosis Date Noted   OA (osteoarthritis) of knee 09/09/2022   Exertional dyspnea 04/17/2022   Elevated coronary artery calcium score 04/17/2022   Spondylolisthesis at L4-L5 level 12/27/2021   Ductal carcinoma in situ (DCIS) of left breast 01/19/2018   Generalized anxiety disorder 11/07/2017   Depression 11/07/2017   Insomnia 11/07/2017   Cervical spondylosis 03/19/2013    PCP: Creola Corn  REFERRING PROVIDER: Trudee Grip  REFERRING DIAG: R TKA 09/09/22  THERAPY DIAG:  S/P total knee arthroplasty, right  Acute pain of right knee  Stiffness of right knee, not elsewhere classified  Muscle weakness (generalized)  Localized edema  Other abnormalities of gait and mobility  Rationale for Evaluation and Treatment: Rehabilitation  ONSET DATE: 09/09/22  SUBJECTIVE:   SUBJECTIVE STATEMENT :  Patient reports that she did some riding in the car yesterday and feels stiff today  PERTINENT HISTORY: R TKA 8/19 L  knee steroid injection 09/09/22  PAIN:  Are you having pain? Yes: NPRS scale: 3/10 Pain location: R knee Pain description: sharp medial R knee  Aggravating factors: weight bearing on the RLE, bending, moving it  Relieving factors: ice helps some, pain meds  PRECAUTIONS: None  RED FLAGS: None   WEIGHT BEARING RESTRICTIONS: No  FALLS:  Has patient fallen in last 6 months? No  LIVING ENVIRONMENT: Lives with: lives with their family and lives with their spouse Lives in: House/apartment Stairs: Yes: External: 3 steps; none Has following equipment at home: Single point cane and Walker - 2 wheeled  OCCUPATION: Not working  PLOF:  Independent  PATIENT GOALS: No pain, to keep the knee moving  NEXT MD VISIT: 09/24/22  OBJECTIVE:   PATIENT SURVEYS:  FOTO 4  COGNITION: Overall cognitive status: Within functional limits for tasks assessed      EDEMA:  Increased swelling surrounding R knee  POSTURE: rounded shoulders and flexed trunk   PALPATION: Warm and TTP  LOWER EXTREMITY ROM:  Active ROM Right eval Left eval Right 09/24/22 Right 09/26/22 Right 10/02/22  11/01/22 12/09/22  Hip flexion          Hip extension          Hip abduction          Hip adduction          Hip internal rotation          Hip external rotation          Knee flexion  60  75* AROM, 86 PROM 82* AROM seated per pt request  78* self AAROM, 76* AROM  PROM 87 AROM 81 PROM 90 AROM 83 AROM 101 PROM 105  Knee extension -30  -15* AROM seated, -15 PROM supine   -16* supine heel prop with quad set  AROM 25 PROM 16 AROM 14 AROM 5 PROM 0  Ankle dorsiflexion          Ankle plantarflexion          Ankle inversion          Ankle eversion           (Blank rows = not tested)  LOWER EXTREMITY MMT:  MMT Right eval Left eval  Hip flexion 2   Hip extension    Hip abduction    Hip adduction    Hip internal rotation    Hip external rotation    Knee flexion 2   Knee extension 2   Ankle dorsiflexion    Ankle plantarflexion    Ankle inversion    Ankle eversion     (Blank rows = not tested)   FUNCTIONAL TESTS:  5 times sit to stand: unable to do  Timed up and go (TUG): 1 min 28s  GAIT: Distance walked: in clinic distances Assistive device utilized: Environmental consultant - 2 wheeled Level of assistance: SBA Comments: antalgic gait, slowed cadence, step to pattern, decreased step length and time on RLE    TODAY'S TREATMENT:  DATE:  01/16/23 Bike level 3 x 7 minutes Tmill push fwd and backward Resisted gait 40# all  directions Leg press position #1 20# sets of 5 and holding 10 seconds after the last rep at the bottom Feet on ball K2C, rotation, bridge, isometric abs STM with the Tgun to the right ITB and quad/hip flexor Passive stretch right knee working on flexion Passive to 116 degrees flexion  01/13/23 Nustep level 5 x 6 minutes Bike level 2 x 6 minutes Resisted gait 40# all directions Tmill push fwd and backward Leg press 20# position #1 Prone and supine passive knee flexion Use of the Tgun on the right thigh, she had increased soreness and pain in the thigh today  01/09/23 Nustep level 5 x 5 minutes Bike level 3 x 5 minutes Leg curls 20# 2x10, then right only  5# leg extension  Leg press 20# working on flexion STM with the T-gun during Passive stretch flexion and extension  01/06/23 Nustep level 4 x 6 minutes 5# LAQ 5# standing HS curls 5# hip abduction Feet on ball bridges, K2C PROM in supine and sitting PROM to 115 degrees but took some time to get there  01/01/23 Nustep level 5 x 6 minutes LE only Bike level 2 x 6 minutes full revolutions Resisted gait all directions, 6" step up forward with CGA, 20# Fast walking fwd and back ward Leg curls 20# 2x10 Tmill push Leg extension left only 5# Leg press 20# position #1  Passive stretch flexion  Passive to 115 and AROM to 105 degrees  12/30/22 Bike full revolutions with trunk lean x 7 minutes Right leg curls 20# 2x10 Right leg extension 5# 2x10 Leg press 20# going to position #1 today 5# right leg extension, abduction, SLR and hip flexion Passive stretch with her in prone and then in sitting continuing to push the ROM  12/25/22 Nustep level 5 x 6 minutes Bike full revolutions x 5 minutes LEg curls 20# Leg extensoin 5# with passive flexion b/n Resisted gait 40# all directions Leg press down to position #2 working on flexion Passive stretch, some contract/relax  12/23/22 Nustep level 5 x 5 minutes 8" lunge stretch Bike  partial and full revolutions Leg press 20# working on flexion Fast walking Backward walking Passive stretch to 109 degrees flexion  12/17/22 NuStep L5x19mins  Progress note- recheck ROM PROM working on knee flexion Supine hip flexor stretch 30s x2 Prone quad stretch 30s x2  Leg press 20# TLE 3x10 seat 4,3,2 Calf raises on leg press 20# 2x12  12/16/22 NuStep L5x42mins  Bike L3 x31mins  Resisted gait 20# 4 way x4  Calf stretch 15s x2  Calf raises 2x12 Leg ext 10# 2x10, RLE 5# x10 HS curls 25# 2x10, RLE 15# x10    12/13/22 NuStep L5 x14mins LE  Bike full revs x86mins with leaning  Step ups 6" Lateral step ups 6" Step overs 4" Lateral heel taps 4"  Leg press 20# working on flexion, seat 2 PROM to R knee focusing on flexion  PATIENT EDUCATION:  Education details: POC and HEP Person educated: Patient Education method: Explanation Education comprehension: verbalized understanding  HOME EXERCISE PROGRAM: Access Code: [email or web address]  ASSESSMENT:  CLINICAL IMPRESSION:  we are to see here through January, then take a month off with her trying on her own, and see how she does.   She was in the car a lot yesterday and reports very stiff, she does however get to a good ROM after some STM and  some passive stretch, does well with this while distracted and allows the passive stretch OBJECTIVE IMPAIRMENTS: Abnormal gait, difficulty walking, decreased ROM, decreased strength, and pain.   ACTIVITY LIMITATIONS: bending, sitting, standing, squatting, sleeping, stairs, transfers, bathing, toileting, and locomotion level  PARTICIPATION LIMITATIONS: meal prep, cleaning, laundry, driving, shopping, and community activity  REHAB POTENTIAL: Good  CLINICAL DECISION MAKING: Stable/uncomplicated  EVALUATION COMPLEXITY: Low   GOALS: Goals reviewed with patient? Yes  SHORT TERM GOALS: Target date: 10/24/22  Patient will be independent with initial HEP. Goal status: 09/20/22 met  2.  Patient  will be do TUG < 30s Baseline: 1 min 28s  Goal status: ongoing 10/07/22, 18.57s MET 10/09/22  3.  Patient will be able to do 5 sit to stands  Baseline: unable to do  Goal status: progressing 10/04/22, MET 10/09/22   LONG TERM GOALS: Target date: 01/31/23  Patient will be independent with advanced/ongoing HEP to improve outcomes and carryover.  Goal status: ongoing 11/06/22  2.  Patient will report at least 75% improvement in R knee pain to improve QOL. Baseline: 10/10 Goal status: IN PROGRESS  60% better 11/20/22, 65% 12/06/22, 70% 12/17/22  3.  Patient will demonstrate improved R knee AROM to >/= 0-120 deg to allow for normal gait and stair mechanics. Baseline: 30-0-60 10/09/22: 16-0-78  11/01/22: 14-0-83 11/20/22: 10-97 12/06/22: 10-0-100 12/17/22: 8-0-104 Goal status: IN PROGRESS  4.  Patient will demonstrate improved functional LE strength as demonstrated by 5/5. Baseline: 2/5 Goal status: MET 3+/5 10/09/22, 5/5 11/01/22   5.  Patient will be able to ambulate 500' with LRAD and normal gait pattern without increased pain to access community.  Baseline: using RW small in home ambulation distances Goal status: progressing w/SPC 12/02/22, doing some walking without AD   6.  Patient will report 6 on FOTO (patient reported outcome measure) to demonstrate improved functional ability. Baseline: 4, 40-11/11/24 Goal status: MET 53 12/17/22  PLAN:  PT FREQUENCY: 2x/week  PT DURATION: 12 weeks  PLANNED INTERVENTIONS: Therapeutic exercises, Therapeutic activity, Neuromuscular re-education, Balance training, Gait training, Patient/Family education, Self Care, Joint mobilization, Stair training, Electrical stimulation, Cryotherapy, Moist heat, scar mobilization, Vasopneumatic device, and Manual therapy  PLAN FOR NEXT SESSION: continue to push the ROM start to teach her how to do the machines on her own  Patient Details  Name: [name] MRN: [id number] Date of Birth:  10-May-1960 Referring Provider:  Ollen Gross, MD  Encounter Date: 01/17/2023   Jearld Lesch, PT 01/17/2023, 10:25 AM

## 2023-01-20 ENCOUNTER — Encounter: Payer: Self-pay | Admitting: Physical Therapy

## 2023-01-20 ENCOUNTER — Telehealth: Payer: Self-pay | Admitting: Psychiatry

## 2023-01-20 ENCOUNTER — Ambulatory Visit: Payer: No Typology Code available for payment source | Admitting: Physical Therapy

## 2023-01-20 DIAGNOSIS — R2689 Other abnormalities of gait and mobility: Secondary | ICD-10-CM

## 2023-01-20 DIAGNOSIS — M25561 Pain in right knee: Secondary | ICD-10-CM

## 2023-01-20 DIAGNOSIS — Z96651 Presence of right artificial knee joint: Secondary | ICD-10-CM

## 2023-01-20 DIAGNOSIS — R600 Localized edema: Secondary | ICD-10-CM

## 2023-01-20 DIAGNOSIS — M25661 Stiffness of right knee, not elsewhere classified: Secondary | ICD-10-CM

## 2023-01-20 DIAGNOSIS — M6281 Muscle weakness (generalized): Secondary | ICD-10-CM

## 2023-01-20 NOTE — Therapy (Signed)
OUTPATIENT PHYSICAL THERAPY LOWER EXTREMITY TREATMENT     Patient Name: [name] MRN: [id number] DOB:[birth year], 62 y.o., female Today's Date: 01/20/2023  END OF SESSION:  PT End of Session - 01/20/23 1707     Visit Number 49    Date for PT Re-Evaluation 01/31/23    Authorization Type [employer]    PT Start Time 1700    PT Stop Time 1745    PT Time Calculation (min) 45 min    Activity Tolerance Patient tolerated treatment well    Behavior During Therapy WFL for tasks assessed/performed             Past Medical History:  Diagnosis Date   Arthritis    Back pain of lumbar region with sciatica    Cancer (HCC)    breast   Depression    Diabetes mellitus, type II (HCC)    Family history of anesthesia complication    grandfather died under anesthesia 70's- had heart issues   H/O carpal tunnel repair 2014   Headache(784.0)    Hyperlipidemia    Hypertension    Hypothyroidism    Medial meniscus tear    PONV (postoperative nausea and vomiting)    Sleep apnea    cpap since 10   Past Surgical History:  Procedure Laterality Date   BILATERAL TOTAL MASTECTOMY WITH AXILLARY LYMPH NODE DISSECTION     BREAST RECONSTRUCTION     CERVICAL DISC ARTHROPLASTY N/A 03/19/2013   Procedure: CERVICAL FIVE TO SIX, CERVICAL SIX TO SEVEN CERVICAL ANTERIOR DISC ARTHROPLASTY;  Surgeon: Barnett Abu, MD;  Location: [facility];  Service: Neurosurgery;  Laterality: N/A;  C56 C67 artificial disc replacement   CESAREAN SECTION     COLONOSCOPY     DILATATION & CURETTAGE/HYSTEROSCOPY WITH TRUECLEAR N/A 11/25/2013   Procedure: DILATATION & CURETTAGE/HYSTEROSCOPY WITH TRUCLEAR;  Surgeon: Meriel Pica, MD;  Location: [facility];  Service: Gynecology;  Laterality: N/A;   ECTOPIC PREGNANCY SURGERY     GALLBLADDER SURGERY     MOUTH SURGERY     child- dog bite   SPINAL FUSION     2023   STERIOD INJECTION Left 09/09/2022   Procedure: LEFT KNEE STEROID INJECTION;  Surgeon: Ollen Gross, MD;   Location: [facility];  Service: Orthopedics;  Laterality: Left;  left knee injection 0928   TONSILLECTOMY     TOTAL KNEE ARTHROPLASTY Right 09/09/2022   Procedure: TOTAL KNEE ARTHROPLASTY;  Surgeon: Ollen Gross, MD;  Location: [facility];  Service: Orthopedics;  Laterality: Right;   TUBAL LIGATION     Patient Active Problem List   Diagnosis Date Noted   OA (osteoarthritis) of knee 09/09/2022   Exertional dyspnea 04/17/2022   Elevated coronary artery calcium score 04/17/2022   Spondylolisthesis at L4-L5 level 12/27/2021   Ductal carcinoma in situ (DCIS) of left breast 01/19/2018   Generalized anxiety disorder 11/07/2017   Depression 11/07/2017   Insomnia 11/07/2017   Cervical spondylosis 03/19/2013    PCP: Creola Corn  REFERRING PROVIDER: Trudee Grip  REFERRING DIAG: R TKA 09/09/22  THERAPY DIAG:  S/P total knee arthroplasty, right  Acute pain of right knee  Stiffness of right knee, not elsewhere classified  Muscle weakness (generalized)  Localized edema  Other abnormalities of gait and mobility  Rationale for Evaluation and Treatment: Rehabilitation  ONSET DATE: 09/09/22  SUBJECTIVE:   SUBJECTIVE STATEMENT :  Patient reports that she had another immunization this past weekend, and reports that she did not do much and is stiff PERTINENT  HISTORY: R TKA 8/19 L knee steroid injection 09/09/22  PAIN:  Are you having pain? Yes: NPRS scale: 3/10 Pain location: R knee Pain description: sharp medial R knee  Aggravating factors: weight bearing on the RLE, bending, moving it  Relieving factors: ice helps some, pain meds  PRECAUTIONS: None  RED FLAGS: None   WEIGHT BEARING RESTRICTIONS: No  FALLS:  Has patient fallen in last 6 months? No  LIVING ENVIRONMENT: Lives with: lives with their family and lives with their spouse Lives in: House/apartment Stairs: Yes: External: 3 steps; none Has following equipment at home: Single point cane and Walker - 2  wheeled  OCCUPATION: Not working  PLOF: Independent  PATIENT GOALS: No pain, to keep the knee moving  NEXT MD VISIT: 09/24/22  OBJECTIVE:   PATIENT SURVEYS:  FOTO 4  COGNITION: Overall cognitive status: Within functional limits for tasks assessed      EDEMA:  Increased swelling surrounding R knee  POSTURE: rounded shoulders and flexed trunk   PALPATION: Warm and TTP  LOWER EXTREMITY ROM:  Active ROM Right eval Left eval Right 09/24/22 Right 09/26/22 Right 10/02/22  11/01/22 12/09/22  Hip flexion          Hip extension          Hip abduction          Hip adduction          Hip internal rotation          Hip external rotation          Knee flexion  60  75* AROM, 86 PROM 82* AROM seated per pt request  78* self AAROM, 76* AROM  PROM 87 AROM 81 PROM 90 AROM 83 AROM 101 PROM 105  Knee extension -30  -15* AROM seated, -15 PROM supine   -16* supine heel prop with quad set  AROM 25 PROM 16 AROM 14 AROM 5 PROM 0  Ankle dorsiflexion          Ankle plantarflexion          Ankle inversion          Ankle eversion           (Blank rows = not tested)  LOWER EXTREMITY MMT:  MMT Right eval Left eval  Hip flexion 2   Hip extension    Hip abduction    Hip adduction    Hip internal rotation    Hip external rotation    Knee flexion 2   Knee extension 2   Ankle dorsiflexion    Ankle plantarflexion    Ankle inversion    Ankle eversion     (Blank rows = not tested)   FUNCTIONAL TESTS:  5 times sit to stand: unable to do  Timed up and go (TUG): 1 min 28s  GAIT: Distance walked: in clinic distances Assistive device utilized: Environmental consultant - 2 wheeled Level of assistance: SBA Comments: antalgic gait, slowed cadence, step to pattern, decreased step length and time on RLE    TODAY'S TREATMENT:  DATE:  01/20/23 Bike level 4 x 6 minutes Nustep level 5 x  5 mintues Tmill push Leg press 20# Leg curls 25# Leg extension 10# Side step on and off airex Passive stretch into flexion  01/16/23 Bike level 3 x 7 minutes Tmill push fwd and backward Resisted gait 40# all directions Leg press position #1 20# sets of 5 and holding 10 seconds after the last rep at the bottom Feet on ball K2C, rotation, bridge, isometric abs STM with the Tgun to the right ITB and quad/hip flexor Passive stretch right knee working on flexion Passive to 116 degrees flexion  01/13/23 Nustep level 5 x 6 minutes Bike level 2 x 6 minutes Resisted gait 40# all directions Tmill push fwd and backward Leg press 20# position #1 Prone and supine passive knee flexion Use of the Tgun on the right thigh, she had increased soreness and pain in the thigh today  01/09/23 Nustep level 5 x 5 minutes Bike level 3 x 5 minutes Leg curls 20# 2x10, then right only  5# leg extension  Leg press 20# working on flexion STM with the T-gun during Passive stretch flexion and extension  01/06/23 Nustep level 4 x 6 minutes 5# LAQ 5# standing HS curls 5# hip abduction Feet on ball bridges, K2C PROM in supine and sitting PROM to 115 degrees but took some time to get there  01/01/23 Nustep level 5 x 6 minutes LE only Bike level 2 x 6 minutes full revolutions Resisted gait all directions, 6" step up forward with CGA, 20# Fast walking fwd and back ward Leg curls 20# 2x10 Tmill push Leg extension left only 5# Leg press 20# position #1  Passive stretch flexion  Passive to 115 and AROM to 105 degrees  12/30/22 Bike full revolutions with trunk lean x 7 minutes Right leg curls 20# 2x10 Right leg extension 5# 2x10 Leg press 20# going to position #1 today 5# right leg extension, abduction, SLR and hip flexion Passive stretch with her in prone and then in sitting continuing to push the ROM  12/25/22 Nustep level 5 x 6 minutes Bike full revolutions x 5 minutes LEg curls 20# Leg  extensoin 5# with passive flexion b/n Resisted gait 40# all directions Leg press down to position #2 working on flexion Passive stretch, some contract/relax  12/23/22 Nustep level 5 x 5 minutes 8" lunge stretch Bike partial and full revolutions Leg press 20# working on flexion Fast walking Backward walking Passive stretch to 109 degrees flexion  12/17/22 NuStep L5x1mins  Progress note- recheck ROM PROM working on knee flexion Supine hip flexor stretch 30s x2 Prone quad stretch 30s x2  Leg press 20# TLE 3x10 seat 4,3,2 Calf raises on leg press 20# 2x12  12/16/22 NuStep L5x24mins  Bike L3 x25mins  Resisted gait 20# 4 way x4  Calf stretch 15s x2  Calf raises 2x12 Leg ext 10# 2x10, RLE 5# x10 HS curls 25# 2x10, RLE 15# x10    12/13/22 NuStep L5 x72mins LE  Bike full revs x19mins with leaning  Step ups 6" Lateral step ups 6" Step overs 4" Lateral heel taps 4"  Leg press 20# working on flexion, seat 2 PROM to R knee focusing on flexion  PATIENT EDUCATION:  Education details: POC and HEP Person educated: Patient Education method: Explanation Education comprehension: verbalized understanding  HOME EXERCISE PROGRAM: Access Code: [email or web address]  ASSESSMENT:  CLINICAL IMPRESSION:  we are to see here through January, then take a month off with her  trying on her own, and see how she does.   Started some education on the machines so she can do on her own and safely in the near future, needs a lot of cues and help to do now.  Still with good flexion, may start som ehigher level balance OBJECTIVE IMPAIRMENTS: Abnormal gait, difficulty walking, decreased ROM, decreased strength, and pain.   ACTIVITY LIMITATIONS: bending, sitting, standing, squatting, sleeping, stairs, transfers, bathing, toileting, and locomotion level  PARTICIPATION LIMITATIONS: meal prep, cleaning, laundry, driving, shopping, and community activity  REHAB POTENTIAL: Good  CLINICAL DECISION MAKING:  Stable/uncomplicated  EVALUATION COMPLEXITY: Low   GOALS: Goals reviewed with patient? Yes  SHORT TERM GOALS: Target date: 10/24/22  Patient will be independent with initial HEP. Goal status: 09/20/22 met  2.  Patient will be do TUG < 30s Baseline: 1 min 28s  Goal status: ongoing 10/07/22, 18.57s MET 10/09/22  3.  Patient will be able to do 5 sit to stands  Baseline: unable to do  Goal status: progressing 10/04/22, MET 10/09/22   LONG TERM GOALS: Target date: 01/31/23  Patient will be independent with advanced/ongoing HEP to improve outcomes and carryover.  Goal status: ongoing 11/06/22  2.  Patient will report at least 75% improvement in R knee pain to improve QOL. Baseline: 10/10 Goal status: IN PROGRESS  60% better 11/20/22, 65% 12/06/22, 70% 12/17/22  3.  Patient will demonstrate improved R knee AROM to >/= 0-120 deg to allow for normal gait and stair mechanics. Baseline: 30-0-60 10/09/22: 16-0-78  11/01/22: 14-0-83 11/20/22: 10-97 12/06/22: 10-0-100 12/17/22: 8-0-104 Goal status: IN PROGRESS  4.  Patient will demonstrate improved functional LE strength as demonstrated by 5/5. Baseline: 2/5 Goal status: MET 3+/5 10/09/22, 5/5 11/01/22   5.  Patient will be able to ambulate 500' with LRAD and normal gait pattern without increased pain to access community.  Baseline: using RW small in home ambulation distances Goal status: progressing w/SPC 12/02/22, doing some walking without AD   6.  Patient will report 36 on FOTO (patient reported outcome measure) to demonstrate improved functional ability. Baseline: 4, 40-11/11/24 Goal status: MET 53 12/17/22  PLAN:  PT FREQUENCY: 2x/week  PT DURATION: 12 weeks  PLANNED INTERVENTIONS: Therapeutic exercises, Therapeutic activity, Neuromuscular re-education, Balance training, Gait training, Patient/Family education, Self Care, Joint mobilization, Stair training, Electrical stimulation, Cryotherapy, Moist heat, scar mobilization,  Vasopneumatic device, and Manual therapy  PLAN FOR NEXT SESSION: continue to push the ROM start to teach her how to do the machines on her own, higher level balance  Patient Details  Name: [name] MRN: [id number] Date of Birth: [birth year] Referring Provider:  Creola Corn, MD  Encounter Date: 01/20/2023   Jearld Lesch, PT 01/20/2023, 5:07 PM

## 2023-01-20 NOTE — Telephone Encounter (Signed)
Pt called and said that the pharmacy doesn't have the xanax script that was sent. Please resend and change the fill daye to January not february

## 2023-01-21 ENCOUNTER — Encounter: Payer: Self-pay | Admitting: Professional Counselor

## 2023-01-21 ENCOUNTER — Encounter: Payer: No Typology Code available for payment source | Admitting: Physical Therapy

## 2023-01-21 ENCOUNTER — Ambulatory Visit (INDEPENDENT_AMBULATORY_CARE_PROVIDER_SITE_OTHER): Payer: No Typology Code available for payment source | Admitting: Professional Counselor

## 2023-01-21 DIAGNOSIS — F411 Generalized anxiety disorder: Secondary | ICD-10-CM | POA: Diagnosis not present

## 2023-01-21 NOTE — Progress Notes (Signed)
      [facility] Counselor/Therapist Progress Note  Patient ID: [name], MRN: [id number],    Date: 01/21/2023  Time Spent: 4:02 PM to 5:00 PM  Treatment Type: Family with patient  Patient present with spouse for session.  Reported Symptoms: Sadness, worries, restlessness, emotional dysregulation, irritability, stress, interpersonal concerns  Mental Status Exam:  Appearance:   Neat     Behavior:  Appropriate, Sharing, and Motivated  Motor:  Normal  Speech/Language:   Clear and Coherent and Normal Rate  Affect:  Appropriate and Congruent  Mood:  anxious  Thought process:  normal  Thought content:    WNL  Sensory/Perceptual disturbances:    WNL  Orientation:  oriented to person, place, time/date, and situation  Attention:  Good  Concentration:  Good  Memory:  WNL  Fund of knowledge:   Good  Insight:    Good  Judgment:   Good  Impulse Control:  Good   Risk Assessment: Danger to Self:  No Self-injurious Behavior: No Danger to Others: No Duty to Warn:no Physical Aggression / Violence:No  Access to Firearms a concern: No  Gang Involvement:No   Subjective: Patient presented to session to address concerns of anxiety.  She reported mixed progress at this time.  She presented to session with her husband.  Patient reported her knee to be healing well and to be grateful for progress.  Patient and spouse described an argument they had, and events leading up to it.  They voiced their grievances with 1 another, and identified their needs.  Counselor helped patient and spouse to identify strategies that would be helpful to resolve conflicts and better meet 1 anothers needs.  For instance they discussed waiting to have challenging conversations until the right time and place (not at night and not in front of others), to limit pathologizing and bringing up mistakes of the past, to work on responsibility to themselves to self soothe, and to pace themselves in their responses, limit  interrupting 1 another, and work to attune to 1 another.  Counselor provided psychoeducation and helped to facilitate insight around behavior patterns that persist outside of content of arguments, and importance of preventative measures to escalation.  Interventions: Solution-Oriented/Positive Psychology, Humanistic/Existential, Insight-Oriented, and Family Systems  Diagnosis:   ICD-10-CM   1. Generalized anxiety disorder  F41.1       Plan: Patient is scheduled for follow-up; continue process work and developing coping skills.  Patient to practice short-term goals between sessions with spouse as identified above.  Progress note was dictated with Dragon and reviewed for accuracy.  Almarie ONEIDA Sprang, [facility]ician [facility]

## 2023-01-24 ENCOUNTER — Ambulatory Visit: Payer: No Typology Code available for payment source | Attending: Orthopedic Surgery | Admitting: Physical Therapy

## 2023-01-24 ENCOUNTER — Encounter: Payer: Self-pay | Admitting: Physical Therapy

## 2023-01-24 DIAGNOSIS — R2689 Other abnormalities of gait and mobility: Secondary | ICD-10-CM | POA: Insufficient documentation

## 2023-01-24 DIAGNOSIS — Z96651 Presence of right artificial knee joint: Secondary | ICD-10-CM | POA: Insufficient documentation

## 2023-01-24 DIAGNOSIS — M25661 Stiffness of right knee, not elsewhere classified: Secondary | ICD-10-CM | POA: Diagnosis present

## 2023-01-24 DIAGNOSIS — R600 Localized edema: Secondary | ICD-10-CM | POA: Diagnosis present

## 2023-01-24 DIAGNOSIS — M6281 Muscle weakness (generalized): Secondary | ICD-10-CM | POA: Insufficient documentation

## 2023-01-24 DIAGNOSIS — M25561 Pain in right knee: Secondary | ICD-10-CM | POA: Insufficient documentation

## 2023-01-24 NOTE — Therapy (Signed)
 OUTPATIENT PHYSICAL THERAPY LOWER EXTREMITY TREATMENT     Patient Name: [name] MRN: [id number] DOB:Aug 29, 1960, 63 y.o., female Today's Date: 01/24/2023  END OF SESSION:  PT End of Session - 01/24/23 0933     Visit Number 50    Date for PT Re-Evaluation 01/31/23    Authorization Type [employer]    PT Start Time 0930    PT Stop Time 1015    PT Time Calculation (min) 45 min    Activity Tolerance Patient tolerated treatment well    Behavior During Therapy WFL for tasks assessed/performed             Past Medical History:  Diagnosis Date   Arthritis    Back pain of lumbar region with sciatica    Cancer (HCC)    breast   Depression    Diabetes mellitus, type II (HCC)    Family history of anesthesia complication    grandfather died under anesthesia 70's- had heart issues   H/O carpal tunnel repair 2014   Headache(784.0)    Hyperlipidemia    Hypertension    Hypothyroidism    Medial meniscus tear    PONV (postoperative nausea and vomiting)    Sleep apnea    cpap since 10   Past Surgical History:  Procedure Laterality Date   BILATERAL TOTAL MASTECTOMY WITH AXILLARY LYMPH NODE DISSECTION     BREAST RECONSTRUCTION     CERVICAL DISC ARTHROPLASTY N/A 03/19/2013   Procedure: CERVICAL FIVE TO SIX, CERVICAL SIX TO SEVEN CERVICAL ANTERIOR DISC ARTHROPLASTY;  Surgeon: Victory Gens, MD;  Location: [facility];  Service: Neurosurgery;  Laterality: N/A;  C56 C67 artificial disc replacement   CESAREAN SECTION     COLONOSCOPY     DILATATION & CURETTAGE/HYSTEROSCOPY WITH TRUECLEAR N/A 11/25/2013   Procedure: DILATATION & CURETTAGE/HYSTEROSCOPY WITH TRUCLEAR;  Surgeon: Charlie CHRISTELLA Croak, MD;  Location: [facility];  Service: Gynecology;  Laterality: N/A;   ECTOPIC PREGNANCY SURGERY     GALLBLADDER SURGERY     MOUTH SURGERY     child- dog bite   SPINAL FUSION     2023   STERIOD INJECTION Left 09/09/2022   Procedure: LEFT KNEE STEROID INJECTION;  Surgeon: Melodi Lerner, MD;  Location:  [facility];  Service: Orthopedics;  Laterality: Left;  left knee injection 0928   TONSILLECTOMY     TOTAL KNEE ARTHROPLASTY Right 09/09/2022   Procedure: TOTAL KNEE ARTHROPLASTY;  Surgeon: Melodi Lerner, MD;  Location: [facility];  Service: Orthopedics;  Laterality: Right;   TUBAL LIGATION     Patient Active Problem List   Diagnosis Date Noted   OA (osteoarthritis) of knee 09/09/2022   Exertional dyspnea 04/17/2022   Elevated coronary artery calcium  score 04/17/2022   Spondylolisthesis at L4-L5 level 12/27/2021   Ductal carcinoma in situ (DCIS) of left breast 01/19/2018   Generalized anxiety disorder 11/07/2017   Depression 11/07/2017   Insomnia 11/07/2017   Cervical spondylosis 03/19/2013    PCP: Norleen Jungling  REFERRING PROVIDER: Lerner Lawn  REFERRING DIAG: R TKA 09/09/22  THERAPY DIAG:  S/P total knee arthroplasty, right  Acute pain of right knee  Stiffness of right knee, not elsewhere classified  Muscle weakness (generalized)  Localized edema  Other abnormalities of gait and mobility  Rationale for Evaluation and Treatment: Rehabilitation  ONSET DATE: 09/09/22  SUBJECTIVE:   SUBJECTIVE STATEMENT :  Patient reports that she had a stomach issue and did not feel very good for a few days PERTINENT HISTORY: R TKA  8/19 L knee steroid injection 09/09/22  PAIN:  Are you having pain? Yes: NPRS scale: 3/10 Pain location: R knee Pain description: sharp medial R knee  Aggravating factors: weight bearing on the RLE, bending, moving it  Relieving factors: ice helps some, pain meds  PRECAUTIONS: None  RED FLAGS: None   WEIGHT BEARING RESTRICTIONS: No  FALLS:  Has patient fallen in last 6 months? No  LIVING ENVIRONMENT: Lives with: lives with their family and lives with their spouse Lives in: House/apartment Stairs: Yes: External: 3 steps; none Has following equipment at home: Single point cane and Walker - 2 wheeled  OCCUPATION: Not working  PLOF:  Independent  PATIENT GOALS: No pain, to keep the knee moving  NEXT MD VISIT: 09/24/22  OBJECTIVE:   PATIENT SURVEYS:  FOTO 4  COGNITION: Overall cognitive status: Within functional limits for tasks assessed      EDEMA:  Increased swelling surrounding R knee  POSTURE: rounded shoulders and flexed trunk   PALPATION: Warm and TTP  LOWER EXTREMITY ROM:  Active ROM Right eval Left eval Right 09/24/22 Right 09/26/22 Right 10/02/22  11/01/22 12/09/22  Hip flexion          Hip extension          Hip abduction          Hip adduction          Hip internal rotation          Hip external rotation          Knee flexion  60  75* AROM, 86 PROM 82* AROM seated per pt request  78* self AAROM, 76* AROM  PROM 87 AROM 81 PROM 90 AROM 83 AROM 101 PROM 105  Knee extension -30  -15* AROM seated, -15 PROM supine   -16* supine heel prop with quad set  AROM 25 PROM 16 AROM 14 AROM 5 PROM 0  Ankle dorsiflexion          Ankle plantarflexion          Ankle inversion          Ankle eversion           (Blank rows = not tested)  LOWER EXTREMITY MMT:  MMT Right eval Left eval  Hip flexion 2   Hip extension    Hip abduction    Hip adduction    Hip internal rotation    Hip external rotation    Knee flexion 2   Knee extension 2   Ankle dorsiflexion    Ankle plantarflexion    Ankle inversion    Ankle eversion     (Blank rows = not tested)   FUNCTIONAL TESTS:  5 times sit to stand: unable to do  Timed up and go (TUG): 1 min 28s  GAIT: Distance walked: in clinic distances Assistive device utilized: Environmental Consultant - 2 wheeled Level of assistance: SBA Comments: antalgic gait, slowed cadence, step to pattern, decreased step length and time on RLE    TODAY'S TREATMENT:  DATE:  01/24/23 Bike level 4 x 6 minutes Nustep level 5 x 4 minutes Leg curls 25# 2x10 Leg  extension10# LEg press 20# working on extension On bosu balance Stairs step over step at the old building full flights with CGA and a lot of verbal cues  01/20/23 Bike level 4 x 6 minutes Nustep level 5 x 5 mintues Tmill push Leg press 20# Leg curls 25# Leg extension 10# Side step on and off airex Passive stretch into flexion  01/16/23 Bike level 3 x 7 minutes Tmill push fwd and backward Resisted gait 40# all directions Leg press position #1 20# sets of 5 and holding 10 seconds after the last rep at the bottom Feet on ball K2C, rotation, bridge, isometric abs STM with the Tgun to the right ITB and quad/hip flexor Passive stretch right knee working on flexion Passive to 116 degrees flexion  01/13/23 Nustep level 5 x 6 minutes Bike level 2 x 6 minutes Resisted gait 40# all directions Tmill push fwd and backward Leg press 20# position #1 Prone and supine passive knee flexion Use of the Tgun on the right thigh, she had increased soreness and pain in the thigh today  01/09/23 Nustep level 5 x 5 minutes Bike level 3 x 5 minutes Leg curls 20# 2x10, then right only  5# leg extension  Leg press 20# working on flexion STM with the T-gun during Passive stretch flexion and extension  01/06/23 Nustep level 4 x 6 minutes 5# LAQ 5# standing HS curls 5# hip abduction Feet on ball bridges, K2C PROM in supine and sitting PROM to 115 degrees but took some time to get there  01/01/23 Nustep level 5 x 6 minutes LE only Bike level 2 x 6 minutes full revolutions Resisted gait all directions, 6 step up forward with CGA, 20# Fast walking fwd and back ward Leg curls 20# 2x10 Tmill push Leg extension left only 5# Leg press 20# position #1  Passive stretch flexion  Passive to 115 and AROM to 105 degrees  12/30/22 Bike full revolutions with trunk lean x 7 minutes Right leg curls 20# 2x10 Right leg extension 5# 2x10 Leg press 20# going to position #1 today 5# right leg extension,  abduction, SLR and hip flexion Passive stretch with her in prone and then in sitting continuing to push the ROM  12/25/22 Nustep level 5 x 6 minutes Bike full revolutions x 5 minutes LEg curls 20# Leg extensoin 5# with passive flexion b/n Resisted gait 40# all directions Leg press down to position #2 working on flexion Passive stretch, some contract/relax  12/23/22 Nustep level 5 x 5 minutes 8 lunge stretch Bike partial and full revolutions Leg press 20# working on flexion Fast walking Backward walking Passive stretch to 109 degrees flexion  12/17/22 NuStep L5x78mins  Progress note- recheck ROM PROM working on knee flexion Supine hip flexor stretch 30s x2 Prone quad stretch 30s x2  Leg press 20# TLE 3x10 seat 4,3,2 Calf raises on leg press 20# 2x12  12/16/22 NuStep L5x35mins  Bike L3 x53mins  Resisted gait 20# 4 way x4  Calf stretch 15s x2  Calf raises 2x12 Leg ext 10# 2x10, RLE 5# x10 HS curls 25# 2x10, RLE 15# x10    12/13/22 NuStep L5 x66mins LE  Bike full revs x64mins with leaning  Step ups 6 Lateral step ups 6 Step overs 4 Lateral heel taps 4  Leg press 20# working on flexion, seat 2 PROM to R knee focusing on  flexion  PATIENT EDUCATION:  Education details: POC and HEP Person educated: Patient Education method: Explanation Education comprehension: verbalized understanding  HOME EXERCISE PROGRAM: Access Code: [id number]  ASSESSMENT:  CLINICAL IMPRESSION:  we are to see here through January, then take a month off with her trying on her own, and see how she does.   Started some education on the machines so she can do on her own and safely in the near future, needs a lot of cues and help to do now.  Still with good flexion, may start som ehigher level balance, she really struggle son the 6 steps, hers at home are 8 OBJECTIVE IMPAIRMENTS: Abnormal gait, difficulty walking, decreased ROM, decreased strength, and pain.   ACTIVITY LIMITATIONS: bending,  sitting, standing, squatting, sleeping, stairs, transfers, bathing, toileting, and locomotion level  PARTICIPATION LIMITATIONS: meal prep, cleaning, laundry, driving, shopping, and community activity  REHAB POTENTIAL: Good  CLINICAL DECISION MAKING: Stable/uncomplicated  EVALUATION COMPLEXITY: Low   GOALS: Goals reviewed with patient? Yes  SHORT TERM GOALS: Target date: 10/24/22  Patient will be independent with initial HEP. Goal status: 09/20/22 met  2.  Patient will be do TUG < 30s Baseline: 1 min 28s  Goal status: ongoing 10/07/22, 18.57s MET 10/09/22  3.  Patient will be able to do 5 sit to stands  Baseline: unable to do  Goal status: progressing 10/04/22, MET 10/09/22   LONG TERM GOALS: Target date: 01/31/23  Patient will be independent with advanced/ongoing HEP to improve outcomes and carryover.  Goal status: ongoing 11/06/22  2.  Patient will report at least 75% improvement in R knee pain to improve QOL. Baseline: 10/10 Goal status: IN PROGRESS  60% better 11/20/22, 65% 12/06/22, 70% 12/17/22  3.  Patient will demonstrate improved R knee AROM to >/= 0-120 deg to allow for normal gait and stair mechanics. Baseline: 30-0-60 10/09/22: 16-0-78  11/01/22: 14-0-83 11/20/22: 10-97 12/06/22: 10-0-100 12/17/22: 8-0-104 Goal status: IN PROGRESS  4.  Patient will demonstrate improved functional LE strength as demonstrated by 5/5. Baseline: 2/5 Goal status: MET 3+/5 10/09/22, 5/5 11/01/22   5.  Patient will be able to ambulate 500' with LRAD and normal gait pattern without increased pain to access community.  Baseline: using RW small in home ambulation distances Goal status: progressing w/SPC 12/02/22, doing some walking without AD   6.  Patient will report 28 on FOTO (patient reported outcome measure) to demonstrate improved functional ability. Baseline: 4, 40-11/11/24 Goal status: MET 53 12/17/22  PLAN:  PT FREQUENCY: 2x/week  PT DURATION: 12 weeks  PLANNED  INTERVENTIONS: Therapeutic exercises, Therapeutic activity, Neuromuscular re-education, Balance training, Gait training, Patient/Family education, Self Care, Joint mobilization, Stair training, Electrical stimulation, Cryotherapy, Moist heat, scar mobilization, Vasopneumatic device, and Manual therapy  PLAN FOR NEXT SESSION: continue to push the ROM start to teach her how to do the machines on her own, higher level balance  Patient Details  Name: JAMARIE JOPLIN MRN: [id number] Date of Birth: [birth year] Referring Provider:  Melodi Lerner, MD  Encounter Date: 01/24/2023   OBADIAH OZELL ORN, PT 01/24/2023, 9:33 AM

## 2023-01-27 ENCOUNTER — Ambulatory Visit: Payer: No Typology Code available for payment source | Admitting: Physical Therapy

## 2023-01-27 ENCOUNTER — Encounter: Payer: Self-pay | Admitting: Physical Therapy

## 2023-01-27 DIAGNOSIS — M25661 Stiffness of right knee, not elsewhere classified: Secondary | ICD-10-CM

## 2023-01-27 DIAGNOSIS — R600 Localized edema: Secondary | ICD-10-CM

## 2023-01-27 DIAGNOSIS — Z96651 Presence of right artificial knee joint: Secondary | ICD-10-CM | POA: Diagnosis not present

## 2023-01-27 DIAGNOSIS — M25561 Pain in right knee: Secondary | ICD-10-CM

## 2023-01-27 DIAGNOSIS — M6281 Muscle weakness (generalized): Secondary | ICD-10-CM

## 2023-01-27 NOTE — Therapy (Signed)
 OUTPATIENT PHYSICAL THERAPY LOWER EXTREMITY TREATMENT     Patient Name: [name] MRN: [id number] DOB:[birth year], 63 y.o., female Today's Date: 01/27/2023  END OF SESSION:  PT End of Session - 01/27/23 1538     Visit Number 51    Date for PT Re-Evaluation 01/31/23    Authorization Type [employer]    PT Start Time 1530    PT Stop Time 1615    PT Time Calculation (min) 45 min    Activity Tolerance Patient tolerated treatment well    Behavior During Therapy WFL for tasks assessed/performed             Past Medical History:  Diagnosis Date   Arthritis    Back pain of lumbar region with sciatica    Cancer (HCC)    breast   Depression    Diabetes mellitus, type II (HCC)    Family history of anesthesia complication    grandfather died under anesthesia 70's- had heart issues   H/O carpal tunnel repair 2014   Headache(784.0)    Hyperlipidemia    Hypertension    Hypothyroidism    Medial meniscus tear    PONV (postoperative nausea and vomiting)    Sleep apnea    cpap since 10   Past Surgical History:  Procedure Laterality Date   BILATERAL TOTAL MASTECTOMY WITH AXILLARY LYMPH NODE DISSECTION     BREAST RECONSTRUCTION     CERVICAL DISC ARTHROPLASTY N/A 03/19/2013   Procedure: CERVICAL FIVE TO SIX, CERVICAL SIX TO SEVEN CERVICAL ANTERIOR DISC ARTHROPLASTY;  Surgeon: Victory Gens, MD;  Location: [facility];  Service: Neurosurgery;  Laterality: N/A;  C56 C67 artificial disc replacement   CESAREAN SECTION     COLONOSCOPY     DILATATION & CURETTAGE/HYSTEROSCOPY WITH TRUECLEAR N/A 11/25/2013   Procedure: DILATATION & CURETTAGE/HYSTEROSCOPY WITH TRUCLEAR;  Surgeon: Charlie CHRISTELLA Croak, MD;  Location: [facility];  Service: Gynecology;  Laterality: N/A;   ECTOPIC PREGNANCY SURGERY     GALLBLADDER SURGERY     MOUTH SURGERY     child- dog bite   SPINAL FUSION     2023   STERIOD INJECTION Left 09/09/2022   Procedure: LEFT KNEE STEROID INJECTION;  Surgeon: Melodi Lerner, MD;  Location:  [facility];  Service: Orthopedics;  Laterality: Left;  left knee injection 0928   TONSILLECTOMY     TOTAL KNEE ARTHROPLASTY Right 09/09/2022   Procedure: TOTAL KNEE ARTHROPLASTY;  Surgeon: Melodi Lerner, MD;  Location: [facility];  Service: Orthopedics;  Laterality: Right;   TUBAL LIGATION     Patient Active Problem List   Diagnosis Date Noted   OA (osteoarthritis) of knee 09/09/2022   Exertional dyspnea 04/17/2022   Elevated coronary artery calcium  score 04/17/2022   Spondylolisthesis at L4-L5 level 12/27/2021   Ductal carcinoma in situ (DCIS) of left breast 01/19/2018   Generalized anxiety disorder 11/07/2017   Depression 11/07/2017   Insomnia 11/07/2017   Cervical spondylosis 03/19/2013    PCP: Norleen Jungling  REFERRING PROVIDER: Lerner Lawn  REFERRING DIAG: R TKA 09/09/22  THERAPY DIAG:  S/P total knee arthroplasty, right  Acute pain of right knee  Stiffness of right knee, not elsewhere classified  Muscle weakness (generalized)  Localized edema  Rationale for Evaluation and Treatment: Rehabilitation  ONSET DATE: 09/09/22  SUBJECTIVE:   SUBJECTIVE STATEMENT :  Patient reports that she got sick on Friday and really has not done much of the exercises PERTINENT HISTORY: R TKA 8/19 L knee steroid injection 09/09/22  PAIN:  Are you having pain? Yes: NPRS scale: 3/10 Pain location: R knee Pain description: sharp medial R knee  Aggravating factors: weight bearing on the RLE, bending, moving it  Relieving factors: ice helps some, pain meds  PRECAUTIONS: None  RED FLAGS: None   WEIGHT BEARING RESTRICTIONS: No  FALLS:  Has patient fallen in last 6 months? No  LIVING ENVIRONMENT: Lives with: lives with their family and lives with their spouse Lives in: House/apartment Stairs: Yes: External: 3 steps; none Has following equipment at home: Single point cane and Walker - 2 wheeled  OCCUPATION: Not working  PLOF: Independent  PATIENT GOALS: No pain, to keep the knee  moving  NEXT MD VISIT: 09/24/22  OBJECTIVE:   PATIENT SURVEYS:  FOTO 4  COGNITION: Overall cognitive status: Within functional limits for tasks assessed      EDEMA:  Increased swelling surrounding R knee  POSTURE: rounded shoulders and flexed trunk   PALPATION: Warm and TTP  LOWER EXTREMITY ROM:  Active ROM Right eval Left eval Right 09/24/22 Right 09/26/22 Right 10/02/22  11/01/22 12/09/22  Hip flexion          Hip extension          Hip abduction          Hip adduction          Hip internal rotation          Hip external rotation          Knee flexion  60  75* AROM, 86 PROM 82* AROM seated per pt request  78* self AAROM, 76* AROM  PROM 87 AROM 81 PROM 90 AROM 83 AROM 101 PROM 105  Knee extension -30  -15* AROM seated, -15 PROM supine   -16* supine heel prop with quad set  AROM 25 PROM 16 AROM 14 AROM 5 PROM 0  Ankle dorsiflexion          Ankle plantarflexion          Ankle inversion          Ankle eversion           (Blank rows = not tested)  LOWER EXTREMITY MMT:  MMT Right eval Left eval  Hip flexion 2   Hip extension    Hip abduction    Hip adduction    Hip internal rotation    Hip external rotation    Knee flexion 2   Knee extension 2   Ankle dorsiflexion    Ankle plantarflexion    Ankle inversion    Ankle eversion     (Blank rows = not tested)   FUNCTIONAL TESTS:  5 times sit to stand: unable to do  Timed up and go (TUG): 1 min 28s  GAIT: Distance walked: in clinic distances Assistive device utilized: Environmental Consultant - 2 wheeled Level of assistance: SBA Comments: antalgic gait, slowed cadence, step to pattern, decreased step length and time on RLE    TODAY'S TREATMENT:  DATE:  01/27/23 Nustep level 5 x 6 mintues Bike level 4 x 5 minutes Elliptical x 2 minutes a lot of help Right leg only 20# leg curls Right leg only 5#  Leg  press 20# Passive stretch in prone and in supine  01/24/23 Bike level 4 x 6 minutes Nustep level 5 x 4 minutes Leg curls 25# 2x10 Leg extension10# LEg press 20# working on extension On bosu balance Stairs step over step at the old building full flights with CGA and a lot of verbal cues  01/20/23 Bike level 4 x 6 minutes Nustep level 5 x 5 mintues Tmill push Leg press 20# Leg curls 25# Leg extension 10# Side step on and off airex Passive stretch into flexion  01/16/23 Bike level 3 x 7 minutes Tmill push fwd and backward Resisted gait 40# all directions Leg press position #1 20# sets of 5 and holding 10 seconds after the last rep at the bottom Feet on ball K2C, rotation, bridge, isometric abs STM with the Tgun to the right ITB and quad/hip flexor Passive stretch right knee working on flexion Passive to 116 degrees flexion  01/13/23 Nustep level 5 x 6 minutes Bike level 2 x 6 minutes Resisted gait 40# all directions Tmill push fwd and backward Leg press 20# position #1 Prone and supine passive knee flexion Use of the Tgun on the right thigh, she had increased soreness and pain in the thigh today  01/09/23 Nustep level 5 x 5 minutes Bike level 3 x 5 minutes Leg curls 20# 2x10, then right only  5# leg extension  Leg press 20# working on flexion STM with the T-gun during Passive stretch flexion and extension  01/06/23 Nustep level 4 x 6 minutes 5# LAQ 5# standing HS curls 5# hip abduction Feet on ball bridges, K2C PROM in supine and sitting PROM to 115 degrees but took some time to get there  01/01/23 Nustep level 5 x 6 minutes LE only Bike level 2 x 6 minutes full revolutions Resisted gait all directions, 6 step up forward with CGA, 20# Fast walking fwd and back ward Leg curls 20# 2x10 Tmill push Leg extension left only 5# Leg press 20# position #1  Passive stretch flexion  Passive to 115 and AROM to 105 degrees  12/30/22 Bike full revolutions with trunk  lean x 7 minutes Right leg curls 20# 2x10 Right leg extension 5# 2x10 Leg press 20# going to position #1 today 5# right leg extension, abduction, SLR and hip flexion Passive stretch with her in prone and then in sitting continuing to push the ROM  12/25/22 Nustep level 5 x 6 minutes Bike full revolutions x 5 minutes LEg curls 20# Leg extensoin 5# with passive flexion b/n Resisted gait 40# all directions Leg press down to position #2 working on flexion Passive stretch, some contract/relax  12/23/22 Nustep level 5 x 5 minutes 8 lunge stretch Bike partial and full revolutions Leg press 20# working on flexion Fast walking Backward walking Passive stretch to 109 degrees flexion   PATIENT EDUCATION:  Education details: POC and HEP Person educated: Patient Education method: Explanation Education comprehension: verbalized understanding  HOME EXERCISE PROGRAM: Access Code: [email or web address]  ASSESSMENT:  CLINICAL IMPRESSION:  we are to see here through January, then take a month off with her trying on her own, and see how she does.   Continued education on the machines so she can do on her own and safely in the near future, needs a lot of  cues and help to do now.  Tried to really push her ROM and get stretches as she was not feeling well over the weekend.   OBJECTIVE IMPAIRMENTS: Abnormal gait, difficulty walking, decreased ROM, decreased strength, and pain.   ACTIVITY LIMITATIONS: bending, sitting, standing, squatting, sleeping, stairs, transfers, bathing, toileting, and locomotion level  PARTICIPATION LIMITATIONS: meal prep, cleaning, laundry, driving, shopping, and community activity  REHAB POTENTIAL: Good  CLINICAL DECISION MAKING: Stable/uncomplicated  EVALUATION COMPLEXITY: Low   GOALS: Goals reviewed with patient? Yes  SHORT TERM GOALS: Target date: 10/24/22  Patient will be independent with initial HEP. Goal status: 09/20/22 met  2.  Patient will be do TUG <  30s Baseline: 1 min 28s  Goal status: ongoing 10/07/22, 18.57s MET 10/09/22  3.  Patient will be able to do 5 sit to stands  Baseline: unable to do  Goal status: progressing 10/04/22, MET 10/09/22   LONG TERM GOALS: Target date: 01/31/23  Patient will be independent with advanced/ongoing HEP to improve outcomes and carryover.  Goal status: ongoing 11/06/22  2.  Patient will report at least 75% improvement in R knee pain to improve QOL. Baseline: 10/10 Goal status: IN PROGRESS  60% better 11/20/22, 65% 12/06/22, 70% 12/17/22  3.  Patient will demonstrate improved R knee AROM to >/= 0-120 deg to allow for normal gait and stair mechanics. Baseline: 30-0-60 10/09/22: 16-0-78  11/01/22: 14-0-83 11/20/22: 10-97 12/06/22: 10-0-100 12/17/22: 8-0-104 Goal status: IN PROGRESS  4.  Patient will demonstrate improved functional LE strength as demonstrated by 5/5. Baseline: 2/5 Goal status: MET 3+/5 10/09/22, 5/5 11/01/22   5.  Patient will be able to ambulate 500' with LRAD and normal gait pattern without increased pain to access community.  Baseline: using RW small in home ambulation distances Goal status: progressing w/SPC 12/02/22, doing some walking without AD   6.  Patient will report 24 on FOTO (patient reported outcome measure) to demonstrate improved functional ability. Baseline: 4, 40-11/11/24 Goal status: MET 53 12/17/22  PLAN:  PT FREQUENCY: 2x/week  PT DURATION: 12 weeks  PLANNED INTERVENTIONS: Therapeutic exercises, Therapeutic activity, Neuromuscular re-education, Balance training, Gait training, Patient/Family education, Self Care, Joint mobilization, Stair training, Electrical stimulation, Cryotherapy, Moist heat, scar mobilization, Vasopneumatic device, and Manual therapy  PLAN FOR NEXT SESSION: continue to push the ROM start to teach her how to do the machines on her own, higher level balance  Patient Details  Name: [name] MRN: [id number] Date of Birth:  1960-09-24 Referring Provider:  Onita Rush, MD  Encounter Date: 01/27/2023   OBADIAH OZELL ORN, PT 01/27/2023, 3:38 PM

## 2023-01-30 ENCOUNTER — Encounter: Payer: Self-pay | Admitting: Physical Therapy

## 2023-01-30 ENCOUNTER — Ambulatory Visit: Payer: No Typology Code available for payment source | Admitting: Physical Therapy

## 2023-01-30 DIAGNOSIS — M6281 Muscle weakness (generalized): Secondary | ICD-10-CM

## 2023-01-30 DIAGNOSIS — M25561 Pain in right knee: Secondary | ICD-10-CM

## 2023-01-30 DIAGNOSIS — Z96651 Presence of right artificial knee joint: Secondary | ICD-10-CM

## 2023-01-30 DIAGNOSIS — M25661 Stiffness of right knee, not elsewhere classified: Secondary | ICD-10-CM

## 2023-01-30 DIAGNOSIS — R600 Localized edema: Secondary | ICD-10-CM

## 2023-01-30 NOTE — Therapy (Signed)
 OUTPATIENT PHYSICAL THERAPY LOWER EXTREMITY TREATMENT     Patient Name: [name] MRN: [id number] DOB:[birth year], 63 y.o., female Today's Date: 01/30/2023  END OF SESSION:  PT End of Session - 01/30/23 1537     Visit Number 52    Date for PT Re-Evaluation 01/31/23    Authorization Type [employer]    PT Start Time 1530    PT Stop Time 1615    PT Time Calculation (min) 45 min    Activity Tolerance Patient tolerated treatment well    Behavior During Therapy WFL for tasks assessed/performed             Past Medical History:  Diagnosis Date   Arthritis    Back pain of lumbar region with sciatica    Cancer (HCC)    breast   Depression    Diabetes mellitus, type II (HCC)    Family history of anesthesia complication    grandfather died under anesthesia 70's- had heart issues   H/O carpal tunnel repair 2014   Headache(784.0)    Hyperlipidemia    Hypertension    Hypothyroidism    Medial meniscus tear    PONV (postoperative nausea and vomiting)    Sleep apnea    cpap since 10   Past Surgical History:  Procedure Laterality Date   BILATERAL TOTAL MASTECTOMY WITH AXILLARY LYMPH NODE DISSECTION     BREAST RECONSTRUCTION     CERVICAL DISC ARTHROPLASTY N/A 03/19/2013   Procedure: CERVICAL FIVE TO SIX, CERVICAL SIX TO SEVEN CERVICAL ANTERIOR DISC ARTHROPLASTY;  Surgeon: Victory Gens, MD;  Location: [facility];  Service: Neurosurgery;  Laterality: N/A;  C56 C67 artificial disc replacement   CESAREAN SECTION     COLONOSCOPY     DILATATION & CURETTAGE/HYSTEROSCOPY WITH TRUECLEAR N/A 11/25/2013   Procedure: DILATATION & CURETTAGE/HYSTEROSCOPY WITH TRUCLEAR;  Surgeon: Charlie CHRISTELLA Croak, MD;  Location: [facility];  Service: Gynecology;  Laterality: N/A;   ECTOPIC PREGNANCY SURGERY     GALLBLADDER SURGERY     MOUTH SURGERY     child- dog bite   SPINAL FUSION     2023   STERIOD INJECTION Left 09/09/2022   Procedure: LEFT KNEE STEROID INJECTION;  Surgeon: Melodi Lerner, MD;  Location:  [facility];  Service: Orthopedics;  Laterality: Left;  left knee injection 0928   TONSILLECTOMY     TOTAL KNEE ARTHROPLASTY Right 09/09/2022   Procedure: TOTAL KNEE ARTHROPLASTY;  Surgeon: Melodi Lerner, MD;  Location: [facility];  Service: Orthopedics;  Laterality: Right;   TUBAL LIGATION     Patient Active Problem List   Diagnosis Date Noted   OA (osteoarthritis) of knee 09/09/2022   Exertional dyspnea 04/17/2022   Elevated coronary artery calcium  score 04/17/2022   Spondylolisthesis at L4-L5 level 12/27/2021   Ductal carcinoma in situ (DCIS) of left breast 01/19/2018   Generalized anxiety disorder 11/07/2017   Depression 11/07/2017   Insomnia 11/07/2017   Cervical spondylosis 03/19/2013    PCP: Norleen Jungling  REFERRING PROVIDER: Lerner Lawn  REFERRING DIAG: R TKA 09/09/22  THERAPY DIAG:  S/P total knee arthroplasty, right  Acute pain of right knee  Stiffness of right knee, not elsewhere classified  Muscle weakness (generalized)  Localized edema  Rationale for Evaluation and Treatment: Rehabilitation  ONSET DATE: 09/09/22  SUBJECTIVE:   SUBJECTIVE STATEMENT :  Patient reports that she is feeling better and over the sickness just feels a little run down PERTINENT HISTORY: R TKA 8/19 L knee steroid injection 09/09/22  PAIN:  Are you having pain? Yes: NPRS scale: 3/10 Pain location: R knee Pain description: sharp medial R knee  Aggravating factors: weight bearing on the RLE, bending, moving it  Relieving factors: ice helps some, pain meds  PRECAUTIONS: None  RED FLAGS: None   WEIGHT BEARING RESTRICTIONS: No  FALLS:  Has patient fallen in last 6 months? No  LIVING ENVIRONMENT: Lives with: lives with their family and lives with their spouse Lives in: House/apartment Stairs: Yes: External: 3 steps; none Has following equipment at home: Single point cane and Walker - 2 wheeled  OCCUPATION: Not working  PLOF: Independent  PATIENT GOALS: No pain, to keep the  knee moving  NEXT MD VISIT: 09/24/22  OBJECTIVE:   PATIENT SURVEYS:  FOTO 4  COGNITION: Overall cognitive status: Within functional limits for tasks assessed      EDEMA:  Increased swelling surrounding R knee  POSTURE: rounded shoulders and flexed trunk   PALPATION: Warm and TTP  LOWER EXTREMITY ROM:  Active ROM Right eval Left eval Right 09/24/22 Right 09/26/22 Right 10/02/22  11/01/22 12/09/22  Hip flexion          Hip extension          Hip abduction          Hip adduction          Hip internal rotation          Hip external rotation          Knee flexion  60  75* AROM, 86 PROM 82* AROM seated per pt request  78* self AAROM, 76* AROM  PROM 87 AROM 81 PROM 90 AROM 83 AROM 101 PROM 105  Knee extension -30  -15* AROM seated, -15 PROM supine   -16* supine heel prop with quad set  AROM 25 PROM 16 AROM 14 AROM 5 PROM 0  Ankle dorsiflexion          Ankle plantarflexion          Ankle inversion          Ankle eversion           (Blank rows = not tested)  LOWER EXTREMITY MMT:  MMT Right eval Left eval  Hip flexion 2   Hip extension    Hip abduction    Hip adduction    Hip internal rotation    Hip external rotation    Knee flexion 2   Knee extension 2   Ankle dorsiflexion    Ankle plantarflexion    Ankle inversion    Ankle eversion     (Blank rows = not tested)   FUNCTIONAL TESTS:  5 times sit to stand: unable to do  Timed up and go (TUG): 1 min 28s  GAIT: Distance walked: in clinic distances Assistive device utilized: Environmental Consultant - 2 wheeled Level of assistance: SBA Comments: antalgic gait, slowed cadence, step to pattern, decreased step length and time on RLE    TODAY'S TREATMENT:  DATE:  01/30/23 Nustep level 5 x 6 minutes Tmill push 30seconds x 3  Bike level 3 x 6 minutes Leg press 20# Resited gait 50# all directions Passive  stretch  got to 118 degrees, AROM after stretch to 112 degrees  01/27/23 Nustep level 5 x 6 mintues Bike level 4 x 5 minutes Elliptical x 2 minutes a lot of help Right leg only 20# leg curls Right leg only 5#  Leg press 20# Passive stretch in prone and in supine  01/24/23 Bike level 4 x 6 minutes Nustep level 5 x 4 minutes Leg curls 25# 2x10 Leg extension10# LEg press 20# working on extension On bosu balance Stairs step over step at the old building full flights with CGA and a lot of verbal cues  01/20/23 Bike level 4 x 6 minutes Nustep level 5 x 5 mintues Tmill push Leg press 20# Leg curls 25# Leg extension 10# Side step on and off airex Passive stretch into flexion  01/16/23 Bike level 3 x 7 minutes Tmill push fwd and backward Resisted gait 40# all directions Leg press position #1 20# sets of 5 and holding 10 seconds after the last rep at the bottom Feet on ball K2C, rotation, bridge, isometric abs STM with the Tgun to the right ITB and quad/hip flexor Passive stretch right knee working on flexion Passive to 116 degrees flexion  01/13/23 Nustep level 5 x 6 minutes Bike level 2 x 6 minutes Resisted gait 40# all directions Tmill push fwd and backward Leg press 20# position #1 Prone and supine passive knee flexion Use of the Tgun on the right thigh, she had increased soreness and pain in the thigh today  01/09/23 Nustep level 5 x 5 minutes Bike level 3 x 5 minutes Leg curls 20# 2x10, then right only  5# leg extension  Leg press 20# working on flexion STM with the T-gun during Passive stretch flexion and extension  01/06/23 Nustep level 4 x 6 minutes 5# LAQ 5# standing HS curls 5# hip abduction Feet on ball bridges, K2C PROM in supine and sitting PROM to 115 degrees but took some time to get there  01/01/23 Nustep level 5 x 6 minutes LE only Bike level 2 x 6 minutes full revolutions Resisted gait all directions, 6 step up forward with CGA, 20# Fast  walking fwd and back ward Leg curls 20# 2x10 Tmill push Leg extension left only 5# Leg press 20# position #1  Passive stretch flexion  Passive to 115 and AROM to 105 degrees  12/30/22 Bike full revolutions with trunk lean x 7 minutes Right leg curls 20# 2x10 Right leg extension 5# 2x10 Leg press 20# going to position #1 today 5# right leg extension, abduction, SLR and hip flexion Passive stretch with her in prone and then in sitting continuing to push the ROM  PATIENT EDUCATION:  Education details: POC and HEP Person educated: Patient Education method: Explanation Education comprehension: verbalized understanding  HOME EXERCISE PROGRAM: Access Code: [email or web address]  ASSESSMENT:  CLINICAL IMPRESSION:  we are to see here through January, then take a month off with her trying on her own, and see how she does.   Continued education on the machines so she can do on her own and safely in the near future, needs a lot of cues and help to do now, she is close to doing the nustep, bike and leg press.  Tried to really push her ROM and get stretches as she was not feeling well over  the weekend.   OBJECTIVE IMPAIRMENTS: Abnormal gait, difficulty walking, decreased ROM, decreased strength, and pain.   ACTIVITY LIMITATIONS: bending, sitting, standing, squatting, sleeping, stairs, transfers, bathing, toileting, and locomotion level  PARTICIPATION LIMITATIONS: meal prep, cleaning, laundry, driving, shopping, and community activity  REHAB POTENTIAL: Good  CLINICAL DECISION MAKING: Stable/uncomplicated  EVALUATION COMPLEXITY: Low   GOALS: Goals reviewed with patient? Yes  SHORT TERM GOALS: Target date: 10/24/22  Patient will be independent with initial HEP. Goal status: 09/20/22 met  2.  Patient will be do TUG < 30s Baseline: 1 min 28s  Goal status: ongoing 10/07/22, 18.57s MET 10/09/22  3.  Patient will be able to do 5 sit to stands  Baseline: unable to do  Goal status: progressing 10/04/22,  MET 10/09/22   LONG TERM GOALS: Target date: 01/31/23  Patient will be independent with advanced/ongoing HEP to improve outcomes and carryover.  Goal status: ongoing 11/06/22  2.  Patient will report at least 75% improvement in R knee pain to improve QOL. Baseline: 10/10 Goal status: IN PROGRESS  60% better 11/20/22, 65% 12/06/22, 70% 12/17/22  3.  Patient will demonstrate improved R knee AROM to >/= 0-120 deg to allow for normal gait and stair mechanics. Baseline: 30-0-60 10/09/22: 16-0-78  11/01/22: 14-0-83 11/20/22: 10-97 12/06/22: 10-0-100 12/17/22: 8-0-104 Goal status: continue to progress and getting closer 01/30/23  4.  Patient will demonstrate improved functional LE strength as demonstrated by 5/5. Baseline: 2/5 Goal status: MET 3+/5 10/09/22, 5/5 11/01/22   5.  Patient will be able to ambulate 500' with LRAD and normal gait pattern without increased pain to access community.  Baseline: using RW small in home ambulation distances Goal status: progressing w/SPC 12/02/22, doing some walking without AD   6.  Patient will report 14 on FOTO (patient reported outcome measure) to demonstrate improved functional ability. Baseline: 4, 40-11/11/24 Goal status: MET 53 12/17/22  PLAN:  PT FREQUENCY: 2x/week  PT DURATION: 12 weeks  PLANNED INTERVENTIONS: Therapeutic exercises, Therapeutic activity, Neuromuscular re-education, Balance training, Gait training, Patient/Family education, Self Care, Joint mobilization, Stair training, Electrical stimulation, Cryotherapy, Moist heat, scar mobilization, Vasopneumatic device, and Manual therapy  PLAN FOR NEXT SESSION: continue to push the ROM start to teach her how to do the machines on her own, higher level balance  Patient Details  Name: [name] MRN: [id number] Date of Birth: [birth year] Referring Provider:  Onita Rush, MD  Encounter Date: 01/30/2023   OBADIAH OZELL ORN, PT 01/30/2023, 3:37 PM

## 2023-02-03 ENCOUNTER — Ambulatory Visit: Payer: No Typology Code available for payment source | Admitting: Physical Therapy

## 2023-02-03 ENCOUNTER — Encounter: Payer: Self-pay | Admitting: Physical Therapy

## 2023-02-03 DIAGNOSIS — M25561 Pain in right knee: Secondary | ICD-10-CM

## 2023-02-03 DIAGNOSIS — Z96651 Presence of right artificial knee joint: Secondary | ICD-10-CM

## 2023-02-03 DIAGNOSIS — M25661 Stiffness of right knee, not elsewhere classified: Secondary | ICD-10-CM

## 2023-02-03 DIAGNOSIS — R600 Localized edema: Secondary | ICD-10-CM

## 2023-02-03 DIAGNOSIS — M6281 Muscle weakness (generalized): Secondary | ICD-10-CM

## 2023-02-03 NOTE — Therapy (Signed)
 OUTPATIENT PHYSICAL THERAPY LOWER EXTREMITY TREATMENT     Patient Name: [name] MRN: [id number] DOB:10-Mar-1960, 63 y.o., female Today's Date: 02/03/2023  END OF SESSION:  PT End of Session - 02/03/23 1543     Visit Number 53    Authorization Type [employer]    PT Start Time 1540    PT Stop Time 1615    PT Time Calculation (min) 35 min    Activity Tolerance Patient tolerated treatment well    Behavior During Therapy WFL for tasks assessed/performed             Past Medical History:  Diagnosis Date   Arthritis    Back pain of lumbar region with sciatica    Cancer (HCC)    breast   Depression    Diabetes mellitus, type II (HCC)    Family history of anesthesia complication    grandfather died under anesthesia 70's- had heart issues   H/O carpal tunnel repair 2014   Headache(784.0)    Hyperlipidemia    Hypertension    Hypothyroidism    Medial meniscus tear    PONV (postoperative nausea and vomiting)    Sleep apnea    cpap since 10   Past Surgical History:  Procedure Laterality Date   BILATERAL TOTAL MASTECTOMY WITH AXILLARY LYMPH NODE DISSECTION     BREAST RECONSTRUCTION     CERVICAL DISC ARTHROPLASTY N/A 03/19/2013   Procedure: CERVICAL FIVE TO SIX, CERVICAL SIX TO SEVEN CERVICAL ANTERIOR DISC ARTHROPLASTY;  Surgeon: Victory Gens, MD;  Location: [facility];  Service: Neurosurgery;  Laterality: N/A;  C56 C67 artificial disc replacement   CESAREAN SECTION     COLONOSCOPY     DILATATION & CURETTAGE/HYSTEROSCOPY WITH TRUECLEAR N/A 11/25/2013   Procedure: DILATATION & CURETTAGE/HYSTEROSCOPY WITH TRUCLEAR;  Surgeon: Charlie CHRISTELLA Croak, MD;  Location: [facility];  Service: Gynecology;  Laterality: N/A;   ECTOPIC PREGNANCY SURGERY     GALLBLADDER SURGERY     MOUTH SURGERY     child- dog bite   SPINAL FUSION     2023   STERIOD INJECTION Left 09/09/2022   Procedure: LEFT KNEE STEROID INJECTION;  Surgeon: Melodi Lerner, MD;  Location: [facility];  Service: Orthopedics;   Laterality: Left;  left knee injection 0928   TONSILLECTOMY     TOTAL KNEE ARTHROPLASTY Right 09/09/2022   Procedure: TOTAL KNEE ARTHROPLASTY;  Surgeon: Melodi Lerner, MD;  Location: [facility];  Service: Orthopedics;  Laterality: Right;   TUBAL LIGATION     Patient Active Problem List   Diagnosis Date Noted   OA (osteoarthritis) of knee 09/09/2022   Exertional dyspnea 04/17/2022   Elevated coronary artery calcium  score 04/17/2022   Spondylolisthesis at L4-L5 level 12/27/2021   Ductal carcinoma in situ (DCIS) of left breast 01/19/2018   Generalized anxiety disorder 11/07/2017   Depression 11/07/2017   Insomnia 11/07/2017   Cervical spondylosis 03/19/2013    PCP: Norleen Jungling  REFERRING PROVIDER: Lerner Lawn  REFERRING DIAG: R TKA 09/09/22  THERAPY DIAG:  S/P total knee arthroplasty, right  Acute pain of right knee  Stiffness of right knee, not elsewhere classified  Muscle weakness (generalized)  Localized edema  Rationale for Evaluation and Treatment: Rehabilitation  ONSET DATE: 09/09/22  SUBJECTIVE:   SUBJECTIVE STATEMENT :  Patient  a little late today.  Reports had a good weekend, did not do as much stretching but was active PERTINENT HISTORY: R TKA 8/19 L knee steroid injection 09/09/22  PAIN:  Are you having  pain? Yes: NPRS scale: 3/10 Pain location: R knee Pain description: sharp medial R knee  Aggravating factors: weight bearing on the RLE, bending, moving it  Relieving factors: ice helps some, pain meds  PRECAUTIONS: None  RED FLAGS: None   WEIGHT BEARING RESTRICTIONS: No  FALLS:  Has patient fallen in last 6 months? No  LIVING ENVIRONMENT: Lives with: lives with their family and lives with their spouse Lives in: House/apartment Stairs: Yes: External: 3 steps; none Has following equipment at home: Single point cane and Walker - 2 wheeled  OCCUPATION: Not working  PLOF: Independent  PATIENT GOALS: No pain, to keep the knee moving  NEXT MD  VISIT: 09/24/22  OBJECTIVE:   PATIENT SURVEYS:  FOTO 4  COGNITION: Overall cognitive status: Within functional limits for tasks assessed      EDEMA:  Increased swelling surrounding R knee  POSTURE: rounded shoulders and flexed trunk   PALPATION: Warm and TTP  LOWER EXTREMITY ROM:  Active ROM Right eval Left eval Right 09/24/22 Right 09/26/22 Right 10/02/22  11/01/22 12/09/22  Hip flexion          Hip extension          Hip abduction          Hip adduction          Hip internal rotation          Hip external rotation          Knee flexion  60  75* AROM, 86 PROM 82* AROM seated per pt request  78* self AAROM, 76* AROM  PROM 87 AROM 81 PROM 90 AROM 83 AROM 101 PROM 105  Knee extension -30  -15* AROM seated, -15 PROM supine   -16* supine heel prop with quad set  AROM 25 PROM 16 AROM 14 AROM 5 PROM 0  Ankle dorsiflexion          Ankle plantarflexion          Ankle inversion          Ankle eversion           (Blank rows = not tested)  LOWER EXTREMITY MMT:  MMT Right eval Left eval  Hip flexion 2   Hip extension    Hip abduction    Hip adduction    Hip internal rotation    Hip external rotation    Knee flexion 2   Knee extension 2   Ankle dorsiflexion    Ankle plantarflexion    Ankle inversion    Ankle eversion     (Blank rows = not tested)   FUNCTIONAL TESTS:  5 times sit to stand: unable to do  Timed up and go (TUG): 1 min 28s  GAIT: Distance walked: in clinic distances Assistive device utilized: Environmental Consultant - 2 wheeled Level of assistance: SBA Comments: antalgic gait, slowed cadence, step to pattern, decreased step length and time on RLE    TODAY'S TREATMENT:  DATE:  02/03/23 Nustep level 5 x 6 minutes Bike level 3 x 4 minutes Tmill off pushes 4 x 30 seconds Leg press working flexion Passive stretch in prone and in sitting STM to  the knee area and quad Passive stretch to 118 degrees flexion  01/30/23 Nustep level 5 x 6 minutes Tmill push 30seconds x 3  Bike level 3 x 6 minutes Leg press 20# Resited gait 50# all directions Passive stretch  got to 118 degrees, AROM after stretch to 112 degrees  01/27/23 Nustep level 5 x 6 mintues Bike level 4 x 5 minutes Elliptical x 2 minutes a lot of help Right leg only 20# leg curls Right leg only 5#  Leg press 20# Passive stretch in prone and in supine  01/24/23 Bike level 4 x 6 minutes Nustep level 5 x 4 minutes Leg curls 25# 2x10 Leg extension10# LEg press 20# working on extension On bosu balance Stairs step over step at the old building full flights with CGA and a lot of verbal cues  01/20/23 Bike level 4 x 6 minutes Nustep level 5 x 5 mintues Tmill push Leg press 20# Leg curls 25# Leg extension 10# Side step on and off airex Passive stretch into flexion  01/16/23 Bike level 3 x 7 minutes Tmill push fwd and backward Resisted gait 40# all directions Leg press position #1 20# sets of 5 and holding 10 seconds after the last rep at the bottom Feet on ball K2C, rotation, bridge, isometric abs STM with the Tgun to the right ITB and quad/hip flexor Passive stretch right knee working on flexion Passive to 116 degrees flexion  01/13/23 Nustep level 5 x 6 minutes Bike level 2 x 6 minutes Resisted gait 40# all directions Tmill push fwd and backward Leg press 20# position #1 Prone and supine passive knee flexion Use of the Tgun on the right thigh, she had increased soreness and pain in the thigh today  01/09/23 Nustep level 5 x 5 minutes Bike level 3 x 5 minutes Leg curls 20# 2x10, then right only  5# leg extension  Leg press 20# working on flexion STM with the T-gun during Passive stretch flexion and extension  01/06/23 Nustep level 4 x 6 minutes 5# LAQ 5# standing HS curls 5# hip abduction Feet on ball bridges, K2C PROM in supine and sitting PROM  to 115 degrees but took some time to get there  01/01/23 Nustep level 5 x 6 minutes LE only Bike level 2 x 6 minutes full revolutions Resisted gait all directions, 6 step up forward with CGA, 20# Fast walking fwd and back ward Leg curls 20# 2x10 Tmill push Leg extension left only 5# Leg press 20# position #1  Passive stretch flexion  Passive to 115 and AROM to 105 degrees  12/30/22 Bike full revolutions with trunk lean x 7 minutes Right leg curls 20# 2x10 Right leg extension 5# 2x10 Leg press 20# going to position #1 today 5# right leg extension, abduction, SLR and hip flexion Passive stretch with her in prone and then in sitting continuing to push the ROM  PATIENT EDUCATION:  Education details: POC and HEP Person educated: Patient Education method: Explanation Education comprehension: verbalized understanding  HOME EXERCISE PROGRAM: Access Code: [email or web address]  ASSESSMENT:  CLINICAL IMPRESSION:  we are to see here through January, then take a month off with her trying on her own, and see how she does.   Continued education on the machines so she can do on her  own and safely in the near future, she is doing better with ability to do this, she will get flustered at times and really struggles with the adjustments but at times will do it correctly.  Tried to really push her ROM got passively to 118 degrees OBJECTIVE IMPAIRMENTS: Abnormal gait, difficulty walking, decreased ROM, decreased strength, and pain.   ACTIVITY LIMITATIONS: bending, sitting, standing, squatting, sleeping, stairs, transfers, bathing, toileting, and locomotion level  PARTICIPATION LIMITATIONS: meal prep, cleaning, laundry, driving, shopping, and community activity  REHAB POTENTIAL: Good  CLINICAL DECISION MAKING: Stable/uncomplicated  EVALUATION COMPLEXITY: Low   GOALS: Goals reviewed with patient? Yes  SHORT TERM GOALS: Target date: 10/24/22  Patient will be independent with initial HEP. Goal status:  09/20/22 met  2.  Patient will be do TUG < 30s Baseline: 1 min 28s  Goal status: ongoing 10/07/22, 18.57s MET 10/09/22  3.  Patient will be able to do 5 sit to stands  Baseline: unable to do  Goal status: progressing 10/04/22, MET 10/09/22   LONG TERM GOALS: Target date: 01/31/23  Patient will be independent with advanced/ongoing HEP to improve outcomes and carryover.  Goal status: ongoing 11/06/22  2.  Patient will report at least 75% improvement in R knee pain to improve QOL. Baseline: 10/10 Goal status: met 02/03/23 60% better 11/20/22, 65% 12/06/22, 70% 12/17/22  3.  Patient will demonstrate improved R knee AROM to >/= 0-120 deg to allow for normal gait and stair mechanics. Baseline: 30-0-60 10/09/22: 16-0-78  11/01/22: 14-0-83 11/20/22: 10-97 12/06/22: 10-0-100 12/17/22: 8-0-104 Goal status: continue to progress and getting closer 01/30/23  4.  Patient will demonstrate improved functional LE strength as demonstrated by 5/5. Baseline: 2/5 Goal status: MET 3+/5 10/09/22, 5/5 11/01/22   5.  Patient will be able to ambulate 500' with LRAD and normal gait pattern without increased pain to access community.  Baseline: using RW small in home ambulation distances Goal status: progressing w/SPC 12/02/22, doing some walking without AD   6.  Patient will report 68 on FOTO (patient reported outcome measure) to demonstrate improved functional ability. Baseline: 4, 40-11/11/24 Goal status: MET 53 12/17/22  PLAN:  PT FREQUENCY: 2x/week  PT DURATION: 12 weeks  PLANNED INTERVENTIONS: Therapeutic exercises, Therapeutic activity, Neuromuscular re-education, Balance training, Gait training, Patient/Family education, Self Care, Joint mobilization, Stair training, Electrical stimulation, Cryotherapy, Moist heat, scar mobilization, Vasopneumatic device, and Manual therapy  PLAN FOR NEXT SESSION: continue to push the ROM start to teach her how to do the machines on her own, higher level  balance  Patient Details  Name: RISSA TURLEY MRN: [id number] Date of Birth: 1960-01-26 Referring Provider:  Onita Rush, MD  Encounter Date: 02/03/2023   OBADIAH OZELL ORN, PT 02/03/2023, 3:46 PM

## 2023-02-04 ENCOUNTER — Ambulatory Visit: Payer: No Typology Code available for payment source | Admitting: Professional Counselor

## 2023-02-04 ENCOUNTER — Encounter: Payer: Self-pay | Admitting: Professional Counselor

## 2023-02-04 ENCOUNTER — Other Ambulatory Visit: Payer: Self-pay | Admitting: Cardiology

## 2023-02-04 DIAGNOSIS — F411 Generalized anxiety disorder: Secondary | ICD-10-CM | POA: Diagnosis not present

## 2023-02-04 DIAGNOSIS — R931 Abnormal findings on diagnostic imaging of heart and coronary circulation: Secondary | ICD-10-CM

## 2023-02-04 NOTE — Progress Notes (Signed)
      [facility] Counselor/Therapist Progress Note  Patient ID: [name], MRN: [id number],    Date: 02/04/2023  Time Spent: 4:10 PM to 5:17 PM  Treatment Type: Family with patient  Patient present with spouse for session.  Reported Symptoms: Worries, restlessness, emotional dysregulation, stress, interpersonal concerns  Mental Status Exam:  Appearance:   Neat     Behavior:  Appropriate and Sharing  Motor:  Normal  Speech/Language:   Clear and Coherent and Normal Rate  Affect:  Appropriate and Congruent  Mood:  normal  Thought process:  normal  Thought content:    WNL  Sensory/Perceptual disturbances:    WNL  Orientation:  oriented to person, place, time/date, and situation  Attention:  Good  Concentration:  Good  Memory:  WNL  Fund of knowledge:   Good  Insight:    Good  Judgment:   Good  Impulse Control:  Good   Risk Assessment: Danger to Self:  No Self-injurious Behavior: No Danger to Others: No Duty to Warn: no Physical Aggression / Violence:No  Access to Firearms a concern: No  Gang Involvement:No   Subjective: Patient presented to session to address concerns of anxiety.  She reported mixed progress at this time.  Spouse presented to session with patient.  Patient processed experience of challenging circumstance with daughter and identified working to set and maintain boundaries and empower daughter in phase of life learning curve.  Counselor assisted with strategies to this effect.  Patient, spouse and counselor discussed patient and spouse financial and living environment issues causing stress, and dynamics around these issues.  Counselor discussed strategies for improvement, including pacing and having patience and self compassion so as not to become overwhelmed with expectations for large change in short amount of time.  They discussed timelines and collaboration for goals, and resourcing external help such as with a firefighter.  Interventions:  Solution-Oriented/Positive Psychology, Humanistic/Existential, Insight-Oriented, and Family Systems  Diagnosis:   ICD-10-CM   1. Generalized anxiety disorder  F41.1       Plan: Patient is scheduled for a follow-up; continue process work and developing coping skills.  Patient short-term goal between sessions to contact financial advisor regarding financial concerns, set finite goal around house clutter with the holiday decor put away as first priority, and to collaborate around concerns regarding daughter in approach necessary conversations in a united and thoughtful way.  Progress note was dictated with Dragon and reviewed for accuracy.  Almarie ONEIDA Sprang, [facility]

## 2023-02-06 ENCOUNTER — Ambulatory Visit: Payer: No Typology Code available for payment source | Admitting: Physical Therapy

## 2023-02-06 ENCOUNTER — Encounter: Payer: Self-pay | Admitting: Physical Therapy

## 2023-02-06 DIAGNOSIS — R600 Localized edema: Secondary | ICD-10-CM

## 2023-02-06 DIAGNOSIS — M25561 Pain in right knee: Secondary | ICD-10-CM

## 2023-02-06 DIAGNOSIS — R2689 Other abnormalities of gait and mobility: Secondary | ICD-10-CM

## 2023-02-06 DIAGNOSIS — M25661 Stiffness of right knee, not elsewhere classified: Secondary | ICD-10-CM

## 2023-02-06 DIAGNOSIS — Z96651 Presence of right artificial knee joint: Secondary | ICD-10-CM | POA: Diagnosis not present

## 2023-02-06 DIAGNOSIS — M6281 Muscle weakness (generalized): Secondary | ICD-10-CM

## 2023-02-06 NOTE — Therapy (Signed)
OUTPATIENT PHYSICAL THERAPY LOWER EXTREMITY TREATMENT     Patient Name: [name] MRN: [id number] DOB:10-20-1960, 63 y.o., female Today's Date: 02/06/2023  END OF SESSION:  PT End of Session - 02/06/23 1545     Visit Number 54    Date for PT Re-Evaluation 03/06/23    Authorization Type [employer]    PT Start Time 1530    PT Stop Time 1615    PT Time Calculation (min) 45 min    Activity Tolerance Patient tolerated treatment well    Behavior During Therapy WFL for tasks assessed/performed             Past Medical History:  Diagnosis Date   Arthritis    Back pain of lumbar region with sciatica    Cancer (HCC)    breast   Depression    Diabetes mellitus, type II (HCC)    Family history of anesthesia complication    grandfather died under anesthesia 70's- had heart issues   H/O carpal tunnel repair 2014   Headache(784.0)    Hyperlipidemia    Hypertension    Hypothyroidism    Medial meniscus tear    PONV (postoperative nausea and vomiting)    Sleep apnea    cpap since 10   Past Surgical History:  Procedure Laterality Date   BILATERAL TOTAL MASTECTOMY WITH AXILLARY LYMPH NODE DISSECTION     BREAST RECONSTRUCTION     CERVICAL DISC ARTHROPLASTY N/A 03/19/2013   Procedure: CERVICAL FIVE TO SIX, CERVICAL SIX TO SEVEN CERVICAL ANTERIOR DISC ARTHROPLASTY;  Surgeon: Barnett Abu, MD;  Location: [facility];  Service: Neurosurgery;  Laterality: N/A;  C56 C67 artificial disc replacement   CESAREAN SECTION     COLONOSCOPY     DILATATION & CURETTAGE/HYSTEROSCOPY WITH TRUECLEAR N/A 11/25/2013   Procedure: DILATATION & CURETTAGE/HYSTEROSCOPY WITH TRUCLEAR;  Surgeon: Meriel Pica, MD;  Location: [facility];  Service: Gynecology;  Laterality: N/A;   ECTOPIC PREGNANCY SURGERY     GALLBLADDER SURGERY     MOUTH SURGERY     child- dog bite   SPINAL FUSION     2023   STERIOD INJECTION Left 09/09/2022   Procedure: LEFT KNEE STEROID INJECTION;  Surgeon: Ollen Gross, MD;   Location: [facility];  Service: Orthopedics;  Laterality: Left;  left knee injection 0928   TONSILLECTOMY     TOTAL KNEE ARTHROPLASTY Right 09/09/2022   Procedure: TOTAL KNEE ARTHROPLASTY;  Surgeon: Ollen Gross, MD;  Location: [facility];  Service: Orthopedics;  Laterality: Right;   TUBAL LIGATION     Patient Active Problem List   Diagnosis Date Noted   OA (osteoarthritis) of knee 09/09/2022   Exertional dyspnea 04/17/2022   Elevated coronary artery calcium score 04/17/2022   Spondylolisthesis at L4-L5 level 12/27/2021   Ductal carcinoma in situ (DCIS) of left breast 01/19/2018   Generalized anxiety disorder 11/07/2017   Depression 11/07/2017   Insomnia 11/07/2017   Cervical spondylosis 03/19/2013    PCP: Creola Corn  REFERRING PROVIDER: Trudee Grip  REFERRING DIAG: R TKA 09/09/22  THERAPY DIAG:  S/P total knee arthroplasty, right  Acute pain of right knee  Stiffness of right knee, not elsewhere classified  Muscle weakness (generalized)  Localized edema  Other abnormalities of gait and mobility  Rationale for Evaluation and Treatment: Rehabilitation  ONSET DATE: 09/09/22  SUBJECTIVE:   SUBJECTIVE STATEMENT :  Patient  with worries about the stairs at home, she is unable to go up and down stairs step over step, her  stairs at home are not ADA compliant and are 8" high. PERTINENT HISTORY: R TKA 8/19 L knee steroid injection 09/09/22  PAIN:  Are you having pain? Yes: NPRS scale: 3/10 Pain location: R knee Pain description: sharp medial R knee  Aggravating factors: weight bearing on the RLE, bending, moving it  Relieving factors: ice helps some, pain meds  PRECAUTIONS: None  RED FLAGS: None   WEIGHT BEARING RESTRICTIONS: No  FALLS:  Has patient fallen in last 6 months? No  LIVING ENVIRONMENT: Lives with: lives with their family and lives with their spouse Lives in: House/apartment Stairs: Yes: External: 3 steps; none Has following equipment at home: Single  point cane and Walker - 2 wheeled  OCCUPATION: Not working  PLOF: Independent  PATIENT GOALS: No pain, to keep the knee moving  NEXT MD VISIT: 09/24/22  OBJECTIVE:   PATIENT SURVEYS:  FOTO 4  COGNITION: Overall cognitive status: Within functional limits for tasks assessed      EDEMA:  Increased swelling surrounding R knee  POSTURE: rounded shoulders and flexed trunk   PALPATION: Warm and TTP  LOWER EXTREMITY ROM:  Active ROM Right eval Left eval Right 09/24/22 Right 09/26/22 Right 10/02/22  11/01/22 12/09/22  Hip flexion          Hip extension          Hip abduction          Hip adduction          Hip internal rotation          Hip external rotation          Knee flexion  60  75* AROM, 86 PROM 82* AROM seated per pt request  78* self AAROM, 76* AROM  PROM 87 AROM 81 PROM 90 AROM 83 AROM 101 PROM 105  Knee extension -30  -15* AROM seated, -15 PROM supine   -16* supine heel prop with quad set  AROM 25 PROM 16 AROM 14 AROM 5 PROM 0  Ankle dorsiflexion          Ankle plantarflexion          Ankle inversion          Ankle eversion           (Blank rows = not tested)  LOWER EXTREMITY MMT:  MMT Right eval Left eval  Hip flexion 2   Hip extension    Hip abduction    Hip adduction    Hip internal rotation    Hip external rotation    Knee flexion 2   Knee extension 2   Ankle dorsiflexion    Ankle plantarflexion    Ankle inversion    Ankle eversion     (Blank rows = not tested)   FUNCTIONAL TESTS:  5 times sit to stand: unable to do  Timed up and go (TUG): 1 min 28s  GAIT: Distance walked: in clinic distances Assistive device utilized: Environmental consultant - 2 wheeled Level of assistance: SBA Comments: antalgic gait, slowed cadence, step to pattern, decreased step length and time on RLE    TODAY'S TREATMENT:  DATE:  02/06/23 Nustep level 5 x 5  minutes Bike level 3 x 5 minutes Went over to the back building and worked on stairs real flights, can go up step over step with the handrail and SPC, can go down step over step but did struggle and needed tactile cues and some encouragement, had left knee pain with this and these are 6" steps, she had to stop after the first flight due to some right knee pain as well came back here and really practiced 4" and 6" steps with two handrails step over step.  Tried various ways to work on quad strength with the ROM on this SAQ and LAQ 5# with cues for TKE  02/03/23 Nustep level 5 x 6 minutes Bike level 3 x 4 minutes Tmill off pushes 4 x 30 seconds Leg press working flexion Passive stretch in prone and in sitting STM to the knee area and quad Passive stretch to 118 degrees flexion  01/30/23 Nustep level 5 x 6 minutes Tmill push 30seconds x 3  Bike level 3 x 6 minutes Leg press 20# Resited gait 50# all directions Passive stretch  got to 118 degrees, AROM after stretch to 112 degrees  01/27/23 Nustep level 5 x 6 mintues Bike level 4 x 5 minutes Elliptical x 2 minutes a lot of help Right leg only 20# leg curls Right leg only 5#  Leg press 20# Passive stretch in prone and in supine  01/24/23 Bike level 4 x 6 minutes Nustep level 5 x 4 minutes Leg curls 25# 2x10 Leg extension10# LEg press 20# working on extension On bosu balance Stairs step over step at the old building full flights with CGA and a lot of verbal cues  01/20/23 Bike level 4 x 6 minutes Nustep level 5 x 5 mintues Tmill push Leg press 20# Leg curls 25# Leg extension 10# Side step on and off airex Passive stretch into flexion  01/16/23 Bike level 3 x 7 minutes Tmill push fwd and backward Resisted gait 40# all directions Leg press position #1 20# sets of 5 and holding 10 seconds after the last rep at the bottom Feet on ball K2C, rotation, bridge, isometric abs STM with the Tgun to the right ITB and quad/hip  flexor Passive stretch right knee working on flexion Passive to 116 degrees flexion  01/13/23 Nustep level 5 x 6 minutes Bike level 2 x 6 minutes Resisted gait 40# all directions Tmill push fwd and backward Leg press 20# position #1 Prone and supine passive knee flexion Use of the Tgun on the right thigh, she had increased soreness and pain in the thigh today  01/09/23 Nustep level 5 x 5 minutes Bike level 3 x 5 minutes Leg curls 20# 2x10, then right only  5# leg extension  Leg press 20# working on flexion STM with the T-gun during Passive stretch flexion and extension  01/06/23 Nustep level 4 x 6 minutes 5# LAQ 5# standing HS curls 5# hip abduction Feet on ball bridges, K2C PROM in supine and sitting PROM to 115 degrees but took some time to get there  01/01/23 Nustep level 5 x 6 minutes LE only Bike level 2 x 6 minutes full revolutions Resisted gait all directions, 6" step up forward with CGA, 20# Fast walking fwd and back ward Leg curls 20# 2x10 Tmill push Leg extension left only 5# Leg press 20# position #1  Passive stretch flexion  Passive to 115 and AROM to 105 degrees  12/30/22 Bike  full revolutions with trunk lean x 7 minutes Right leg curls 20# 2x10 Right leg extension 5# 2x10 Leg press 20# going to position #1 today 5# right leg extension, abduction, SLR and hip flexion Passive stretch with her in prone and then in sitting continuing to push the ROM  PATIENT EDUCATION:  Education details: POC and HEP Person educated: Patient Education method: Explanation Education comprehension: verbalized understanding  HOME EXERCISE PROGRAM: Access Code: [id number]  ASSESSMENT:  CLINICAL IMPRESSION:  we are to see here through January, then take a month off with her trying on her own, and see how she does.   She came in today with c/o not able to do her stairs at home normally, we had talked about this in the past and I had asked her to measure, they are 8" steps,  she wants to be able to do these step over step up and down "like before the back and knee surgeries".  The left knee is a limiting factor due to pain.  The 8" teps may be a lot for her to do this. OBJECTIVE IMPAIRMENTS: Abnormal gait, difficulty walking, decreased ROM, decreased strength, and pain.   ACTIVITY LIMITATIONS: bending, sitting, standing, squatting, sleeping, stairs, transfers, bathing, toileting, and locomotion level  PARTICIPATION LIMITATIONS: meal prep, cleaning, laundry, driving, shopping, and community activity  REHAB POTENTIAL: Good  CLINICAL DECISION MAKING: Stable/uncomplicated  EVALUATION COMPLEXITY: Low   GOALS: Goals reviewed with patient? Yes  SHORT TERM GOALS: Target date: 10/24/22  Patient will be independent with initial HEP. Goal status: 09/20/22 met  2.  Patient will be do TUG < 30s Baseline: 1 min 28s  Goal status: ongoing 10/07/22, 18.57s MET 10/09/22  3.  Patient will be able to do 5 sit to stands  Baseline: unable to do  Goal status: progressing 10/04/22, MET 10/09/22   LONG TERM GOALS: Target date: 01/31/23  Patient will be independent with advanced/ongoing HEP to improve outcomes and carryover.  Goal status: ongoing 11/06/22  2.  Patient will report at least 75% improvement in R knee pain to improve QOL. Baseline: 10/10 Goal status: met 02/03/23 60% better 11/20/22, 65% 12/06/22, 70% 12/17/22  3.  Patient will demonstrate improved R knee AROM to >/= 0-120 deg to allow for normal gait and stair mechanics. Baseline: 30-0-60 10/09/22: 16-0-78  11/01/22: 14-0-83 11/20/22: 10-97 12/06/22: 10-0-100 12/17/22: 8-0-104 Goal status: continue to progress and getting closer 01/30/23  4.  Patient will demonstrate improved functional LE strength as demonstrated by 5/5. Baseline: 2/5 Goal status: MET 3+/5 10/09/22, 5/5 11/01/22   5.  Patient will be able to ambulate 500' with LRAD and normal gait pattern without increased pain to access community.   Baseline: using RW small in home ambulation distances Goal status: progressing w/SPC 12/02/22, doing some walking without AD   6.  Patient will report 6 on FOTO (patient reported outcome measure) to demonstrate improved functional ability. Baseline: 4, 40-11/11/24 Goal status: MET 53 12/17/22  PLAN:  PT FREQUENCY: 2x/week  PT DURATION: 12 weeks  PLANNED INTERVENTIONS: Therapeutic exercises, Therapeutic activity, Neuromuscular re-education, Balance training, Gait training, Patient/Family education, Self Care, Joint mobilization, Stair training, Electrical stimulation, Cryotherapy, Moist heat, scar mobilization, Vasopneumatic device, and Manual therapy  PLAN FOR NEXT SESSION: continue to push the ROM start to teach her how to do the machines on her own, higher level balance, work on the stairs  Patient Details  Name: [name] MRN: [id number] Date of Birth: [birth year] Referring Provider:  Creola Corn, MD  Encounter Date: 02/06/2023   Jearld Lesch, PT 02/06/2023, 4:12 PM

## 2023-02-10 ENCOUNTER — Ambulatory Visit: Payer: No Typology Code available for payment source | Admitting: Physical Therapy

## 2023-02-10 ENCOUNTER — Encounter: Payer: Self-pay | Admitting: Physical Therapy

## 2023-02-10 DIAGNOSIS — R2689 Other abnormalities of gait and mobility: Secondary | ICD-10-CM

## 2023-02-10 DIAGNOSIS — Z96651 Presence of right artificial knee joint: Secondary | ICD-10-CM

## 2023-02-10 DIAGNOSIS — M6281 Muscle weakness (generalized): Secondary | ICD-10-CM

## 2023-02-10 DIAGNOSIS — M25661 Stiffness of right knee, not elsewhere classified: Secondary | ICD-10-CM

## 2023-02-10 DIAGNOSIS — M25561 Pain in right knee: Secondary | ICD-10-CM

## 2023-02-10 DIAGNOSIS — R600 Localized edema: Secondary | ICD-10-CM

## 2023-02-10 NOTE — Therapy (Signed)
OUTPATIENT PHYSICAL THERAPY LOWER EXTREMITY TREATMENT     Patient Name: [name] MRN: [id number] DOB:02/28/1960, 63 y.o., female Today's Date: 02/10/2023  END OF SESSION:  PT End of Session - 02/10/23 1536     Visit Number 55    Date for PT Re-Evaluation 03/06/23    Authorization Type [employer]    PT Start Time 1532    PT Stop Time 1619    PT Time Calculation (min) 47 min    Activity Tolerance Patient tolerated treatment well    Behavior During Therapy WFL for tasks assessed/performed             Past Medical History:  Diagnosis Date   Arthritis    Back pain of lumbar region with sciatica    Cancer (HCC)    breast   Depression    Diabetes mellitus, type II (HCC)    Family history of anesthesia complication    grandfather died under anesthesia 70's- had heart issues   H/O carpal tunnel repair 2014   Headache(784.0)    Hyperlipidemia    Hypertension    Hypothyroidism    Medial meniscus tear    PONV (postoperative nausea and vomiting)    Sleep apnea    cpap since 10   Past Surgical History:  Procedure Laterality Date   BILATERAL TOTAL MASTECTOMY WITH AXILLARY LYMPH NODE DISSECTION     BREAST RECONSTRUCTION     CERVICAL DISC ARTHROPLASTY N/A 03/19/2013   Procedure: CERVICAL FIVE TO SIX, CERVICAL SIX TO SEVEN CERVICAL ANTERIOR DISC ARTHROPLASTY;  Surgeon: Barnett Abu, MD;  Location: [facility];  Service: Neurosurgery;  Laterality: N/A;  C56 C67 artificial disc replacement   CESAREAN SECTION     COLONOSCOPY     DILATATION & CURETTAGE/HYSTEROSCOPY WITH TRUECLEAR N/A 11/25/2013   Procedure: DILATATION & CURETTAGE/HYSTEROSCOPY WITH TRUCLEAR;  Surgeon: Meriel Pica, MD;  Location: [facility];  Service: Gynecology;  Laterality: N/A;   ECTOPIC PREGNANCY SURGERY     GALLBLADDER SURGERY     MOUTH SURGERY     child- dog bite   SPINAL FUSION     2023   STERIOD INJECTION Left 09/09/2022   Procedure: LEFT KNEE STEROID INJECTION;  Surgeon: Ollen Gross, MD;   Location: [facility];  Service: Orthopedics;  Laterality: Left;  left knee injection 0928   TONSILLECTOMY     TOTAL KNEE ARTHROPLASTY Right 09/09/2022   Procedure: TOTAL KNEE ARTHROPLASTY;  Surgeon: Ollen Gross, MD;  Location: [facility];  Service: Orthopedics;  Laterality: Right;   TUBAL LIGATION     Patient Active Problem List   Diagnosis Date Noted   OA (osteoarthritis) of knee 09/09/2022   Exertional dyspnea 04/17/2022   Elevated coronary artery calcium score 04/17/2022   Spondylolisthesis at L4-L5 level 12/27/2021   Ductal carcinoma in situ (DCIS) of left breast 01/19/2018   Generalized anxiety disorder 11/07/2017   Depression 11/07/2017   Insomnia 11/07/2017   Cervical spondylosis 03/19/2013    PCP: Creola Corn  REFERRING PROVIDER: Trudee Grip  REFERRING DIAG: R TKA 09/09/22  THERAPY DIAG:  S/P total knee arthroplasty, right  Acute pain of right knee  Stiffness of right knee, not elsewhere classified  Muscle weakness (generalized)  Localized edema  Other abnormalities of gait and mobility  Rationale for Evaluation and Treatment: Rehabilitation  ONSET DATE: 09/09/22  SUBJECTIVE:   SUBJECTIVE STATEMENT :  Pateint contacted the PA and she sent over an order for conitnuation of PT for 6 more weeks.  Patient  with  worries about the stairs at home, she is unable to go up and down stairs step over step, her stairs at home are not ADA compliant and are 8" high. PERTINENT HISTORY: R TKA 8/19 L knee steroid injection 09/09/22  PAIN:  Are you having pain? Yes: NPRS scale: 3/10 Pain location: R knee Pain description: sharp medial R knee  Aggravating factors: weight bearing on the RLE, bending, moving it  Relieving factors: ice helps some, pain meds  PRECAUTIONS: None  RED FLAGS: None   WEIGHT BEARING RESTRICTIONS: No  FALLS:  Has patient fallen in last 6 months? No  LIVING ENVIRONMENT: Lives with: lives with their family and lives with their spouse Lives in:  House/apartment Stairs: Yes: External: 3 steps; none Has following equipment at home: Single point cane and Walker - 2 wheeled  OCCUPATION: Not working  PLOF: Independent  PATIENT GOALS: No pain, to keep the knee moving  NEXT MD VISIT: 09/24/22  OBJECTIVE:   PATIENT SURVEYS:  FOTO 4  COGNITION: Overall cognitive status: Within functional limits for tasks assessed      EDEMA:  Increased swelling surrounding R knee  POSTURE: rounded shoulders and flexed trunk   PALPATION: Warm and TTP  LOWER EXTREMITY ROM:  Active ROM Right eval Left eval Right 09/24/22 Right 09/26/22 Right 10/02/22  11/01/22 12/09/22  Hip flexion          Hip extension          Hip abduction          Hip adduction          Hip internal rotation          Hip external rotation          Knee flexion  60  75* AROM, 86 PROM 82* AROM seated per pt request  78* self AAROM, 76* AROM  PROM 87 AROM 81 PROM 90 AROM 83 AROM 101 PROM 105  Knee extension -30  -15* AROM seated, -15 PROM supine   -16* supine heel prop with quad set  AROM 25 PROM 16 AROM 14 AROM 5 PROM 0  Ankle dorsiflexion          Ankle plantarflexion          Ankle inversion          Ankle eversion           (Blank rows = not tested)  LOWER EXTREMITY MMT:  MMT Right eval Left eval  Hip flexion 2   Hip extension    Hip abduction    Hip adduction    Hip internal rotation    Hip external rotation    Knee flexion 2   Knee extension 2   Ankle dorsiflexion    Ankle plantarflexion    Ankle inversion    Ankle eversion     (Blank rows = not tested)   FUNCTIONAL TESTS:  5 times sit to stand: unable to do  Timed up and go (TUG): 1 min 28s  GAIT: Distance walked: in clinic distances Assistive device utilized: Environmental consultant - 2 wheeled Level of assistance: SBA Comments: antalgic gait, slowed cadence, step to pattern, decreased step length and time on RLE    TODAY'S TREATMENT:  DATE:  02/10/23 Bike level 4 x 5 minutes Over to the old building to work on stairs, we did up and down step over step, I did have her pause when going down with the left leg first to get more stretch on the right knee We did come back and practice 6 and 8" step down, very difficult for her We then worked on kneeling down as she has said she wants to be able to go to yoga again and needs to get up and down from the floor, worked on this various ways and how to get up from this position, we were kneeling on the quad fold mat  02/06/23 Nustep level 5 x 5 minutes Bike level 3 x 5 minutes Went over to the back building and worked on stairs real flights, can go up step over step with the handrail and SPC, can go down step over step but did struggle and needed tactile cues and some encouragement, had left knee pain with this and these are 6" steps, she had to stop after the first flight due to some right knee pain as well came back here and really practiced 4" and 6" steps with two handrails step over step.  Tried various ways to work on quad strength with the ROM on this SAQ and LAQ 5# with cues for TKE  02/03/23 Nustep level 5 x 6 minutes Bike level 3 x 4 minutes Tmill off pushes 4 x 30 seconds Leg press working flexion Passive stretch in prone and in sitting STM to the knee area and quad Passive stretch to 118 degrees flexion  01/30/23 Nustep level 5 x 6 minutes Tmill push 30seconds x 3  Bike level 3 x 6 minutes Leg press 20# Resited gait 50# all directions Passive stretch  got to 118 degrees, AROM after stretch to 112 degrees  01/27/23 Nustep level 5 x 6 mintues Bike level 4 x 5 minutes Elliptical x 2 minutes a lot of help Right leg only 20# leg curls Right leg only 5#  Leg press 20# Passive stretch in prone and in supine  01/24/23 Bike level 4 x 6 minutes Nustep level 5 x 4 minutes Leg curls 25# 2x10 Leg extension10# LEg  press 20# working on extension On bosu balance Stairs step over step at the old building full flights with CGA and a lot of verbal cues  01/20/23 Bike level 4 x 6 minutes Nustep level 5 x 5 mintues Tmill push Leg press 20# Leg curls 25# Leg extension 10# Side step on and off airex Passive stretch into flexion  01/16/23 Bike level 3 x 7 minutes Tmill push fwd and backward Resisted gait 40# all directions Leg press position #1 20# sets of 5 and holding 10 seconds after the last rep at the bottom Feet on ball K2C, rotation, bridge, isometric abs STM with the Tgun to the right ITB and quad/hip flexor Passive stretch right knee working on flexion Passive to 116 degrees flexion  01/13/23 Nustep level 5 x 6 minutes Bike level 2 x 6 minutes Resisted gait 40# all directions Tmill push fwd and backward Leg press 20# position #1 Prone and supine passive knee flexion Use of the Tgun on the right thigh, she had increased soreness and pain in the thigh today  PATIENT EDUCATION:  Education details: POC and HEP Person educated: Patient Education method: Explanation Education comprehension: verbalized understanding  HOME EXERCISE PROGRAM: Access Code: [email or web address]  ASSESSMENT:  CLINICAL IMPRESSION:  We continued to work  on the stairs as she messaged the MD and told them of her issues with wanting to do stairs at home and get up and down from the floor for yoga.  Biggest issue is her stairs at home are 8" steps.  She was able to do the 6" stairs today very similar to about a month ago, step over step, last week she really struggled due to stiffness and pain.  We also worked on up and down from the floor, very tender with kneeling, this was her first time to attmept this OBJECTIVE IMPAIRMENTS: Abnormal gait, difficulty walking, decreased ROM, decreased strength, and pain.   ACTIVITY LIMITATIONS: bending, sitting, standing, squatting, sleeping, stairs, transfers, bathing, toileting, and  locomotion level  PARTICIPATION LIMITATIONS: meal prep, cleaning, laundry, driving, shopping, and community activity  REHAB POTENTIAL: Good  CLINICAL DECISION MAKING: Stable/uncomplicated  EVALUATION COMPLEXITY: Low   GOALS: Goals reviewed with patient? Yes  SHORT TERM GOALS: Target date: 10/24/22  Patient will be independent with initial HEP. Goal status: 09/20/22 met  2.  Patient will be do TUG < 30s Baseline: 1 min 28s  Goal status: ongoing 10/07/22, 18.57s MET 10/09/22  3.  Patient will be able to do 5 sit to stands  Baseline: unable to do  Goal status: progressing 10/04/22, MET 10/09/22   LONG TERM GOALS: Target date: 01/31/23  Patient will be independent with advanced/ongoing HEP to improve outcomes and carryover.  Goal status: ongoing 11/06/22  2.  Patient will report at least 75% improvement in R knee pain to improve QOL. Baseline: 10/10 Goal status: met 02/03/23 60% better 11/20/22, 65% 12/06/22, 70% 12/17/22  3.  Patient will demonstrate improved R knee AROM to >/= 0-120 deg to allow for normal gait and stair mechanics. Baseline: 30-0-60 10/09/22: 16-0-78  11/01/22: 14-0-83 11/20/22: 10-97 12/06/22: 10-0-100 12/17/22: 8-0-104 Goal status: continue to progress and getting closer 01/30/23  4.  Patient will demonstrate improved functional LE strength as demonstrated by 5/5. Baseline: 2/5 Goal status: MET 3+/5 10/09/22, 5/5 11/01/22   5.  Patient will be able to ambulate 500' with LRAD and normal gait pattern without increased pain to access community.  Baseline: using RW small in home ambulation distances Goal status: progressing w/SPC 12/02/22, doing some walking without AD   6.  Patient will report 64 on FOTO (patient reported outcome measure) to demonstrate improved functional ability. Baseline: 4, 40-11/11/24 Goal status: MET 53 12/17/22  PLAN:  PT FREQUENCY: 2x/week  PT DURATION: 12 weeks  PLANNED INTERVENTIONS: Therapeutic exercises, Therapeutic  activity, Neuromuscular re-education, Balance training, Gait training, Patient/Family education, Self Care, Joint mobilization, Stair training, Electrical stimulation, Cryotherapy, Moist heat, scar mobilization, Vasopneumatic device, and Manual therapy  PLAN FOR NEXT SESSION: continue to push the ROM start to teach her how to do the machines on her own, higher level balance, work on the stairs and up from floor  Patient Details  Name: [name] MRN: [id number] Date of Birth: [birth year] Referring Provider:  Creola Corn, MD  Encounter Date: 02/10/2023   Jearld Lesch, PT 02/10/2023, 3:36 PM

## 2023-02-13 ENCOUNTER — Ambulatory Visit: Payer: No Typology Code available for payment source | Admitting: Physical Therapy

## 2023-02-13 ENCOUNTER — Encounter: Payer: Self-pay | Admitting: Physical Therapy

## 2023-02-13 DIAGNOSIS — R2689 Other abnormalities of gait and mobility: Secondary | ICD-10-CM

## 2023-02-13 DIAGNOSIS — M25561 Pain in right knee: Secondary | ICD-10-CM

## 2023-02-13 DIAGNOSIS — Z96651 Presence of right artificial knee joint: Secondary | ICD-10-CM | POA: Diagnosis not present

## 2023-02-13 DIAGNOSIS — M25661 Stiffness of right knee, not elsewhere classified: Secondary | ICD-10-CM

## 2023-02-13 DIAGNOSIS — M6281 Muscle weakness (generalized): Secondary | ICD-10-CM

## 2023-02-13 DIAGNOSIS — R600 Localized edema: Secondary | ICD-10-CM

## 2023-02-13 NOTE — Therapy (Signed)
OUTPATIENT PHYSICAL THERAPY LOWER EXTREMITY TREATMENT     Patient Name: [name] MRN: [id number] DOB:Nov 08, 1960, 63 y.o., female Today's Date: 02/13/2023  END OF SESSION:  PT End of Session - 02/13/23 1527     Visit Number 56    Date for PT Re-Evaluation 03/06/23    Authorization Type [employer]    PT Start Time 1528    PT Stop Time 1615    PT Time Calculation (min) 47 min    Activity Tolerance Patient tolerated treatment well    Behavior During Therapy WFL for tasks assessed/performed             Past Medical History:  Diagnosis Date   Arthritis    Back pain of lumbar region with sciatica    Cancer (HCC)    breast   Depression    Diabetes mellitus, type II (HCC)    Family history of anesthesia complication    grandfather died under anesthesia 70's- had heart issues   H/O carpal tunnel repair 2014   Headache(784.0)    Hyperlipidemia    Hypertension    Hypothyroidism    Medial meniscus tear    PONV (postoperative nausea and vomiting)    Sleep apnea    cpap since 10   Past Surgical History:  Procedure Laterality Date   BILATERAL TOTAL MASTECTOMY WITH AXILLARY LYMPH NODE DISSECTION     BREAST RECONSTRUCTION     CERVICAL DISC ARTHROPLASTY N/A 03/19/2013   Procedure: CERVICAL FIVE TO SIX, CERVICAL SIX TO SEVEN CERVICAL ANTERIOR DISC ARTHROPLASTY;  Surgeon: Barnett Abu, MD;  Location: [facility];  Service: Neurosurgery;  Laterality: N/A;  C56 C67 artificial disc replacement   CESAREAN SECTION     COLONOSCOPY     DILATATION & CURETTAGE/HYSTEROSCOPY WITH TRUECLEAR N/A 11/25/2013   Procedure: DILATATION & CURETTAGE/HYSTEROSCOPY WITH TRUCLEAR;  Surgeon: Meriel Pica, MD;  Location: [facility];  Service: Gynecology;  Laterality: N/A;   ECTOPIC PREGNANCY SURGERY     GALLBLADDER SURGERY     MOUTH SURGERY     child- dog bite   SPINAL FUSION     2023   STERIOD INJECTION Left 09/09/2022   Procedure: LEFT KNEE STEROID INJECTION;  Surgeon: Ollen Gross, MD;   Location: [facility];  Service: Orthopedics;  Laterality: Left;  left knee injection 0928   TONSILLECTOMY     TOTAL KNEE ARTHROPLASTY Right 09/09/2022   Procedure: TOTAL KNEE ARTHROPLASTY;  Surgeon: Ollen Gross, MD;  Location: [facility];  Service: Orthopedics;  Laterality: Right;   TUBAL LIGATION     Patient Active Problem List   Diagnosis Date Noted   OA (osteoarthritis) of knee 09/09/2022   Exertional dyspnea 04/17/2022   Elevated coronary artery calcium score 04/17/2022   Spondylolisthesis at L4-L5 level 12/27/2021   Ductal carcinoma in situ (DCIS) of left breast 01/19/2018   Generalized anxiety disorder 11/07/2017   Depression 11/07/2017   Insomnia 11/07/2017   Cervical spondylosis 03/19/2013    PCP: Creola Corn  REFERRING PROVIDER: Trudee Grip  REFERRING DIAG: R TKA 09/09/22  THERAPY DIAG:  S/P total knee arthroplasty, right  Acute pain of right knee  Stiffness of right knee, not elsewhere classified  Muscle weakness (generalized)  Localized edema  Other abnormalities of gait and mobility  Rationale for Evaluation and Treatment: Rehabilitation  ONSET DATE: 09/09/22  SUBJECTIVE:   SUBJECTIVE STATEMENT :  Pateint reports a lot of stress in her life and thinks that could have an effect on her pain and function with  the stairs, she does report some increased soreness in the knees and quads from the last treatment PERTINENT HISTORY: R TKA 8/19 L knee steroid injection 09/09/22  PAIN:  Are you having pain? Yes: NPRS scale: 3/10 Pain location: R knee Pain description: sharp medial R knee  Aggravating factors: weight bearing on the RLE, bending, moving it  Relieving factors: ice helps some, pain meds  PRECAUTIONS: None  RED FLAGS: None   WEIGHT BEARING RESTRICTIONS: No  FALLS:  Has patient fallen in last 6 months? No  LIVING ENVIRONMENT: Lives with: lives with their family and lives with their spouse Lives in: House/apartment Stairs: Yes: External: 3 steps;  none Has following equipment at home: Single point cane and Walker - 2 wheeled  OCCUPATION: Not working  PLOF: Independent  PATIENT GOALS: No pain, to keep the knee moving  NEXT MD VISIT: 09/24/22  OBJECTIVE:   PATIENT SURVEYS:  FOTO 4  COGNITION: Overall cognitive status: Within functional limits for tasks assessed      EDEMA:  Increased swelling surrounding R knee  POSTURE: rounded shoulders and flexed trunk   PALPATION: Warm and TTP  LOWER EXTREMITY ROM:  Active ROM Right eval Left eval Right 09/24/22 Right 09/26/22 Right 10/02/22  11/01/22 12/09/22  Hip flexion          Hip extension          Hip abduction          Hip adduction          Hip internal rotation          Hip external rotation          Knee flexion  60  75* AROM, 86 PROM 82* AROM seated per pt request  78* self AAROM, 76* AROM  PROM 87 AROM 81 PROM 90 AROM 83 AROM 101 PROM 105  Knee extension -30  -15* AROM seated, -15 PROM supine   -16* supine heel prop with quad set  AROM 25 PROM 16 AROM 14 AROM 5 PROM 0  Ankle dorsiflexion          Ankle plantarflexion          Ankle inversion          Ankle eversion           (Blank rows = not tested)  LOWER EXTREMITY MMT:  MMT Right eval Left eval  Hip flexion 2   Hip extension    Hip abduction    Hip adduction    Hip internal rotation    Hip external rotation    Knee flexion 2   Knee extension 2   Ankle dorsiflexion    Ankle plantarflexion    Ankle inversion    Ankle eversion     (Blank rows = not tested)   FUNCTIONAL TESTS:  5 times sit to stand: unable to do  Timed up and go (TUG): 1 min 28s  GAIT: Distance walked: in clinic distances Assistive device utilized: Environmental consultant - 2 wheeled Level of assistance: SBA Comments: antalgic gait, slowed cadence, step to pattern, decreased step length and time on RLE    TODAY'S TREATMENT:  DATE:  02/13/23 Practices kneeling again both knees on wedge pillow, then the left knee on and in a lunge position Went over to the stairs again and practiced up and down step over step with one hand rail and the [facility] Fitter 1 black band, then 1 black and 1 blue band knee extension SAQ 5# Leg press 20# right leg only did a lot of eccentrics to simulate down stairs, then once she was used to this did concentric On rocker board two ways On bosu upside down and flatside down reaching  02/10/23 Bike level 4 x 5 minutes Over to the old building to work on stairs, we did up and down step over step, I did have her pause when going down with the left leg first to get more stretch on the right knee We did come back and practice 6 and 8" step down, very difficult for her We then worked on kneeling down as she has said she wants to be able to go to yoga again and needs to get up and down from the floor, worked on this various ways and how to get up from this position, we were kneeling on the quad fold mat  02/06/23 Nustep level 5 x 5 minutes Bike level 3 x 5 minutes Went over to the back building and worked on stairs real flights, can go up step over step with the handrail and SPC, can go down step over step but did struggle and needed tactile cues and some encouragement, had left knee pain with this and these are 6" steps, she had to stop after the first flight due to some right knee pain as well came back here and really practiced 4" and 6" steps with two handrails step over step.  Tried various ways to work on quad strength with the ROM on this SAQ and LAQ 5# with cues for TKE  02/03/23 Nustep level 5 x 6 minutes Bike level 3 x 4 minutes Tmill off pushes 4 x 30 seconds Leg press working flexion Passive stretch in prone and in sitting STM to the knee area and quad Passive stretch to 118 degrees flexion  01/30/23 Nustep level 5 x 6 minutes Tmill push 30seconds x 3  Bike level 3 x 6  minutes Leg press 20# Resited gait 50# all directions Passive stretch  got to 118 degrees, AROM after stretch to 112 degrees  01/27/23 Nustep level 5 x 6 mintues Bike level 4 x 5 minutes Elliptical x 2 minutes a lot of help Right leg only 20# leg curls Right leg only 5#  Leg press 20# Passive stretch in prone and in supine  01/24/23 Bike level 4 x 6 minutes Nustep level 5 x 4 minutes Leg curls 25# 2x10 Leg extension10# LEg press 20# working on extension On bosu balance Stairs step over step at the old building full flights with CGA and a lot of verbal cues  01/20/23 Bike level 4 x 6 minutes Nustep level 5 x 5 mintues Tmill push Leg press 20# Leg curls 25# Leg extension 10# Side step on and off airex Passive stretch into flexion  01/16/23 Bike level 3 x 7 minutes Tmill push fwd and backward Resisted gait 40# all directions Leg press position #1 20# sets of 5 and holding 10 seconds after the last rep at the bottom Feet on ball K2C, rotation, bridge, isometric abs STM with the Tgun to the right ITB and quad/hip flexor Passive stretch right knee working on flexion Passive  to 116 degrees flexion  01/13/23 Nustep level 5 x 6 minutes Bike level 2 x 6 minutes Resisted gait 40# all directions Tmill push fwd and backward Leg press 20# position #1 Prone and supine passive knee flexion Use of the Tgun on the right thigh, she had increased soreness and pain in the thigh today  PATIENT EDUCATION:  Education details: POC and HEP Person educated: Patient Education method: Explanation Education comprehension: verbalized understanding  HOME EXERCISE PROGRAM: Access Code: [id number]  ASSESSMENT:  CLINICAL IMPRESSION:  Pateint has a lot of stress in her life, she is a little sore after the last treatment, we tried to again do some kneeling and the stairs, I added fitter and then worked on Actuary, I feel the eccentrics on the leg press may be our best way to safely simulate  the stairs. OBJECTIVE IMPAIRMENTS: Abnormal gait, difficulty walking, decreased ROM, decreased strength, and pain.   ACTIVITY LIMITATIONS: bending, sitting, standing, squatting, sleeping, stairs, transfers, bathing, toileting, and locomotion level  PARTICIPATION LIMITATIONS: meal prep, cleaning, laundry, driving, shopping, and community activity  REHAB POTENTIAL: Good  CLINICAL DECISION MAKING: Stable/uncomplicated  EVALUATION COMPLEXITY: Low   GOALS: Goals reviewed with patient? Yes  SHORT TERM GOALS: Target date: 10/24/22  Patient will be independent with initial HEP. Goal status: 09/20/22 met  2.  Patient will be do TUG < 30s Baseline: 1 min 28s  Goal status: ongoing 10/07/22, 18.57s MET 10/09/22  3.  Patient will be able to do 5 sit to stands  Baseline: unable to do  Goal status: progressing 10/04/22, MET 10/09/22   LONG TERM GOALS: Target date: 01/31/23  Patient will be independent with advanced/ongoing HEP to improve outcomes and carryover.  Goal status: ongoing 11/06/22  2.  Patient will report at least 75% improvement in R knee pain to improve QOL. Baseline: 10/10 Goal status: met 02/03/23 60% better 11/20/22, 65% 12/06/22, 70% 12/17/22  3.  Patient will demonstrate improved R knee AROM to >/= 0-120 deg to allow for normal gait and stair mechanics. Baseline: 30-0-60 10/09/22: 16-0-78  11/01/22: 14-0-83 11/20/22: 10-97 12/06/22: 10-0-100 12/17/22: 8-0-104 Goal status: continue to progress and getting closer 01/30/23  4.  Patient will demonstrate improved functional LE strength as demonstrated by 5/5. Baseline: 2/5 Goal status: MET 3+/5 10/09/22, 5/5 11/01/22   5.  Patient will be able to ambulate 500' with LRAD and normal gait pattern without increased pain to access community.  Baseline: using RW small in home ambulation distances Goal status: progressing w/SPC 12/02/22, doing some walking without AD   6.  Patient will report 73 on FOTO (patient reported outcome  measure) to demonstrate improved functional ability. Baseline: 4, 40-11/11/24 Goal status: MET 53 12/17/22  PLAN:  PT FREQUENCY: 2x/week  PT DURATION: 12 weeks  PLANNED INTERVENTIONS: Therapeutic exercises, Therapeutic activity, Neuromuscular re-education, Balance training, Gait training, Patient/Family education, Self Care, Joint mobilization, Stair training, Electrical stimulation, Cryotherapy, Moist heat, scar mobilization, Vasopneumatic device, and Manual therapy  PLAN FOR NEXT SESSION: continue to push the ROM start to teach her how to do the machines on her own, higher level balance, work on the stairs and up from floor  Patient Details  Name: [name] MRN: [id number] Date of Birth: 29-Jul-1960 Referring Provider:  Creola Corn, MD  Encounter Date: 02/13/2023   Jearld Lesch, PT 02/13/2023, 3:28 PM

## 2023-02-17 ENCOUNTER — Ambulatory Visit: Payer: No Typology Code available for payment source | Admitting: Physical Therapy

## 2023-02-17 ENCOUNTER — Encounter: Payer: Self-pay | Admitting: Physical Therapy

## 2023-02-17 DIAGNOSIS — M25661 Stiffness of right knee, not elsewhere classified: Secondary | ICD-10-CM

## 2023-02-17 DIAGNOSIS — R600 Localized edema: Secondary | ICD-10-CM

## 2023-02-17 DIAGNOSIS — Z96651 Presence of right artificial knee joint: Secondary | ICD-10-CM

## 2023-02-17 DIAGNOSIS — M25561 Pain in right knee: Secondary | ICD-10-CM

## 2023-02-17 DIAGNOSIS — R2689 Other abnormalities of gait and mobility: Secondary | ICD-10-CM

## 2023-02-17 DIAGNOSIS — M6281 Muscle weakness (generalized): Secondary | ICD-10-CM

## 2023-02-17 NOTE — Therapy (Signed)
OUTPATIENT PHYSICAL THERAPY LOWER EXTREMITY TREATMENT     Patient Name: [name] MRN: [id number] DOB:06/11/1960, 63 y.o., female Today's Date: 02/17/2023  END OF SESSION:  PT End of Session - 02/17/23 1531     Visit Number 57    Date for PT Re-Evaluation 03/06/23    Authorization Type [employer]    PT Start Time 1530    PT Stop Time 1615    PT Time Calculation (min) 45 min    Activity Tolerance Patient tolerated treatment well    Behavior During Therapy WFL for tasks assessed/performed             Past Medical History:  Diagnosis Date   Arthritis    Back pain of lumbar region with sciatica    Cancer (HCC)    breast   Depression    Diabetes mellitus, type II (HCC)    Family history of anesthesia complication    grandfather died under anesthesia 70's- had heart issues   H/O carpal tunnel repair 2014   Headache(784.0)    Hyperlipidemia    Hypertension    Hypothyroidism    Medial meniscus tear    PONV (postoperative nausea and vomiting)    Sleep apnea    cpap since 10   Past Surgical History:  Procedure Laterality Date   BILATERAL TOTAL MASTECTOMY WITH AXILLARY LYMPH NODE DISSECTION     BREAST RECONSTRUCTION     CERVICAL DISC ARTHROPLASTY N/A 03/19/2013   Procedure: CERVICAL FIVE TO SIX, CERVICAL SIX TO SEVEN CERVICAL ANTERIOR DISC ARTHROPLASTY;  Surgeon: Barnett Abu, MD;  Location: [facility];  Service: Neurosurgery;  Laterality: N/A;  C56 C67 artificial disc replacement   CESAREAN SECTION     COLONOSCOPY     DILATATION & CURETTAGE/HYSTEROSCOPY WITH TRUECLEAR N/A 11/25/2013   Procedure: DILATATION & CURETTAGE/HYSTEROSCOPY WITH TRUCLEAR;  Surgeon: Meriel Pica, MD;  Location: [facility];  Service: Gynecology;  Laterality: N/A;   ECTOPIC PREGNANCY SURGERY     GALLBLADDER SURGERY     MOUTH SURGERY     child- dog bite   SPINAL FUSION     2023   STERIOD INJECTION Left 09/09/2022   Procedure: LEFT KNEE STEROID INJECTION;  Surgeon: Ollen Gross, MD;   Location: [facility];  Service: Orthopedics;  Laterality: Left;  left knee injection 0928   TONSILLECTOMY     TOTAL KNEE ARTHROPLASTY Right 09/09/2022   Procedure: TOTAL KNEE ARTHROPLASTY;  Surgeon: Ollen Gross, MD;  Location: [facility];  Service: Orthopedics;  Laterality: Right;   TUBAL LIGATION     Patient Active Problem List   Diagnosis Date Noted   OA (osteoarthritis) of knee 09/09/2022   Exertional dyspnea 04/17/2022   Elevated coronary artery calcium score 04/17/2022   Spondylolisthesis at L4-L5 level 12/27/2021   Ductal carcinoma in situ (DCIS) of left breast 01/19/2018   Generalized anxiety disorder 11/07/2017   Depression 11/07/2017   Insomnia 11/07/2017   Cervical spondylosis 03/19/2013    PCP: Creola Corn  REFERRING PROVIDER: Trudee Grip  REFERRING DIAG: R TKA 09/09/22  THERAPY DIAG:  S/P total knee arthroplasty, right  Acute pain of right knee  Stiffness of right knee, not elsewhere classified  Muscle weakness (generalized)  Localized edema  Other abnormalities of gait and mobility  Rationale for Evaluation and Treatment: Rehabilitation  ONSET DATE: 09/09/22  SUBJECTIVE:   SUBJECTIVE STATEMENT :  Pateint reports a lot of stress in her life and thinks that could have an effect on her pain and function with  the stairs, she reports that she has not been able to do her exercises at home R TKA 8/19 L knee steroid injection 09/09/22  PAIN:  Are you having pain? Yes: NPRS scale: 5/10 Pain location: R knee Pain description: sharp medial R knee  Aggravating factors: weight bearing on the RLE, bending, moving it  Relieving factors: ice helps some, pain meds  PRECAUTIONS: None  RED FLAGS: None   WEIGHT BEARING RESTRICTIONS: No  FALLS:  Has patient fallen in last 6 months? No  LIVING ENVIRONMENT: Lives with: lives with their family and lives with their spouse Lives in: House/apartment Stairs: Yes: External: 3 steps; none Has following equipment at home:  Single point cane and Walker - 2 wheeled  OCCUPATION: Not working  PLOF: Independent  PATIENT GOALS: No pain, to keep the knee moving  NEXT MD VISIT: 09/24/22  OBJECTIVE:   PATIENT SURVEYS:  FOTO 4  COGNITION: Overall cognitive status: Within functional limits for tasks assessed      EDEMA:  Increased swelling surrounding R knee  POSTURE: rounded shoulders and flexed trunk   PALPATION: Warm and TTP  LOWER EXTREMITY ROM:  Active ROM Right eval Left eval Right 09/24/22 Right 09/26/22 Right 10/02/22  11/01/22 12/09/22  Hip flexion          Hip extension          Hip abduction          Hip adduction          Hip internal rotation          Hip external rotation          Knee flexion  60  75* AROM, 86 PROM 82* AROM seated per pt request  78* self AAROM, 76* AROM  PROM 87 AROM 81 PROM 90 AROM 83 AROM 101 PROM 105  Knee extension -30  -15* AROM seated, -15 PROM supine   -16* supine heel prop with quad set  AROM 25 PROM 16 AROM 14 AROM 5 PROM 0  Ankle dorsiflexion          Ankle plantarflexion          Ankle inversion          Ankle eversion           (Blank rows = not tested)  LOWER EXTREMITY MMT:  MMT Right eval Left eval  Hip flexion 2   Hip extension    Hip abduction    Hip adduction    Hip internal rotation    Hip external rotation    Knee flexion 2   Knee extension 2   Ankle dorsiflexion    Ankle plantarflexion    Ankle inversion    Ankle eversion     (Blank rows = not tested)   FUNCTIONAL TESTS:  5 times sit to stand: unable to do  Timed up and go (TUG): 1 min 28s  GAIT: Distance walked: in clinic distances Assistive device utilized: Environmental consultant - 2 wheeled Level of assistance: SBA Comments: antalgic gait, slowed cadence, step to pattern, decreased step length and time on RLE    TODAY'S TREATMENT:  DATE:  02/17/23 Bike level  4 x 6 minutes 6" stairs 2 flights up and down step over step 6" stair Moore for flexion With quad folded mat kneeling on knees and then getting up from this position with hands on table We also worked on getting up from and staggered kneel, then while sitting on the mat tried to turn over and get up, required cues and some assist but did a lot of problem solving with her on this On bosu upside down reaching for numbers on wall, then on rocker board two ways reaching Leg press right only 20# varying degrees of flexion and her pushing to build strength  02/13/23 Practices kneeling again both knees on wedge pillow, then the left knee on and in a lunge position Went over to the stairs again and practiced up and down step over step with one hand rail and the [facility] Fitter 1 black band, then 1 black and 1 blue band knee extension SAQ 5# Leg press 20# right leg only did a lot of eccentrics to simulate down stairs, then once she was used to this did concentric On rocker board two ways On bosu upside down and flatside down reaching  02/10/23 Bike level 4 x 5 minutes Over to the old building to work on stairs, we did up and down step over step, I did have her pause when going down with the left leg first to get more Moore on the right knee We did come back and practice 6 and 8" step down, very difficult for her We then worked on kneeling down as she has said she wants to be able to go to yoga again and needs to get up and down from the floor, worked on this various ways and how to get up from this position, we were kneeling on the quad fold mat  02/06/23 Nustep level 5 x 5 minutes Bike level 3 x 5 minutes Went over to the back building and worked on stairs real flights, can go up step over step with the handrail and SPC, can go down step over step but did struggle and needed tactile cues and some encouragement, had left knee pain with this and these are 6" steps, she had to stop after the first flight  due to some right knee pain as well came back here and really practiced 4" and 6" steps with two handrails step over step.  Tried various ways to work on quad strength with the ROM on this SAQ and LAQ 5# with cues for TKE  02/03/23 Nustep level 5 x 6 minutes Bike level 3 x 4 minutes Tmill off pushes 4 x 30 seconds Leg press working flexion Passive Moore in prone and in sitting STM to the knee area and quad Passive Moore to 118 degrees flexion  01/30/23 Nustep level 5 x 6 minutes Tmill push 30seconds x 3  Bike level 3 x 6 minutes Leg press 20# Resited gait 50# all directions Passive Moore  got to 118 degrees, AROM after Moore to 112 degrees  01/27/23 Nustep level 5 x 6 mintues Bike level 4 x 5 minutes Elliptical x 2 minutes a lot of help Right leg only 20# leg curls Right leg only 5#  Leg press 20# Passive Moore in prone and in supine  01/24/23 Bike level 4 x 6 minutes Nustep level 5 x 4 minutes Leg curls 25# 2x10 Leg extension10# LEg press 20# working on extension On bosu balance Stairs step over step at  the old building full flights with CGA and a lot of verbal cues  01/20/23 Bike level 4 x 6 minutes Nustep level 5 x 5 mintues Tmill push Leg press 20# Leg curls 25# Leg extension 10# Side step on and off airex Passive Moore into flexion  01/16/23 Bike level 3 x 7 minutes Tmill push fwd and backward Resisted gait 40# all directions Leg press position #1 20# sets of 5 and holding 10 seconds after the last rep at the bottom Feet on ball K2C, rotation, bridge, isometric abs STM with the Tgun to the right ITB and quad/hip flexor Passive Moore right knee working on flexion Passive to 116 degrees flexion  01/13/23 Nustep level 5 x 6 minutes Bike level 2 x 6 minutes Resisted gait 40# all directions Tmill push fwd and backward Leg press 20# position #1 Prone and supine passive knee flexion Use of the Tgun on the right thigh, she had increased soreness and  pain in the thigh today  PATIENT EDUCATION:  Education details: POC and HEP Person educated: Patient Education method: Explanation Education comprehension: verbalized understanding  HOME EXERCISE PROGRAM: Access Code: [id number]  ASSESSMENT:  CLINICAL IMPRESSION:  Pateint has a lot of stress in her life, she is a little sore after the last treatment, we tried to again do some kneeling and the stairs, I added fitter and then worked on Actuary, I feel the eccentrics on the leg press may be our best way to safely simulate the stairs. OBJECTIVE IMPAIRMENTS: Abnormal gait, difficulty walking, decreased ROM, decreased strength, and pain.   ACTIVITY LIMITATIONS: bending, sitting, standing, squatting, sleeping, stairs, transfers, bathing, toileting, and locomotion level  PARTICIPATION LIMITATIONS: meal prep, cleaning, laundry, driving, shopping, and community activity  REHAB POTENTIAL: Good  CLINICAL DECISION MAKING: Stable/uncomplicated  EVALUATION COMPLEXITY: Low   GOALS: Goals reviewed with patient? Yes  SHORT TERM GOALS: Target date: 10/24/22  Patient will be independent with initial HEP. Goal status: 09/20/22 met  2.  Patient will be do TUG < 30s Baseline: 1 min 28s  Goal status: ongoing 10/07/22, 18.57s MET 10/09/22  3.  Patient will be able to do 5 sit to stands  Baseline: unable to do  Goal status: progressing 10/04/22, MET 10/09/22   LONG TERM GOALS: Target date: 01/31/23  Patient will be independent with advanced/ongoing HEP to improve outcomes and carryover.  Goal status: ongoing 11/06/22  2.  Patient will report at least 75% improvement in R knee pain to improve QOL. Baseline: 10/10 Goal status: met 02/03/23 60% better 11/20/22, 65% 12/06/22, 70% 12/17/22  3.  Patient will demonstrate improved R knee AROM to >/= 0-120 deg to allow for normal gait and stair mechanics. Baseline: 30-0-60 10/09/22: 16-0-78  11/01/22: 14-0-83 11/20/22: 10-97 12/06/22:  10-0-100 12/17/22: 8-0-104 Goal status: continue to progress and getting closer 01/30/23  4.  Patient will demonstrate improved functional LE strength as demonstrated by 5/5. Baseline: 2/5 Goal status: MET 3+/5 10/09/22, 5/5 11/01/22   5.  Patient will be able to ambulate 500' with LRAD and normal gait pattern without increased pain to access community.  Baseline: using RW small in home ambulation distances Goal status: progressing w/SPC 12/02/22, doing some walking without AD   6.  Patient will report 36 on FOTO (patient reported outcome measure) to demonstrate improved functional ability. Baseline: 4, 40-11/11/24 Goal status: MET 53 12/17/22  PLAN:  PT FREQUENCY: 2x/week  PT DURATION: 12 weeks  PLANNED INTERVENTIONS: Therapeutic exercises, Therapeutic activity, Neuromuscular re-education, Balance training, Gait training,  Patient/Family education, Self Care, Joint mobilization, Stair training, Electrical stimulation, Cryotherapy, Moist heat, scar mobilization, Vasopneumatic device, and Manual therapy  PLAN FOR NEXT SESSION: continue to push the ROM start to teach her how to do the machines on her own, higher level balance, work on the stairs and up from floor  Patient Details  Name: ALAYSHA JEFCOAT MRN: [id number] Date of Birth: 1960/11/01 Referring Provider:  Creola Corn, MD  Encounter Date: 02/17/2023   Jearld Lesch, PT 02/17/2023, 3:34 PM

## 2023-02-18 ENCOUNTER — Ambulatory Visit (INDEPENDENT_AMBULATORY_CARE_PROVIDER_SITE_OTHER): Payer: No Typology Code available for payment source | Admitting: Professional Counselor

## 2023-02-18 ENCOUNTER — Encounter: Payer: Self-pay | Admitting: Professional Counselor

## 2023-02-18 DIAGNOSIS — F411 Generalized anxiety disorder: Secondary | ICD-10-CM | POA: Diagnosis not present
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
## 2023-02-20 ENCOUNTER — Ambulatory Visit: Payer: No Typology Code available for payment source | Admitting: Physical Therapy

## 2023-02-20 ENCOUNTER — Encounter: Payer: Self-pay | Admitting: Physical Therapy

## 2023-02-20 DIAGNOSIS — R600 Localized edema: Secondary | ICD-10-CM

## 2023-02-20 DIAGNOSIS — M6281 Muscle weakness (generalized): Secondary | ICD-10-CM

## 2023-02-20 DIAGNOSIS — M25561 Pain in right knee: Secondary | ICD-10-CM

## 2023-02-20 DIAGNOSIS — Z96651 Presence of right artificial knee joint: Secondary | ICD-10-CM | POA: Diagnosis not present

## 2023-02-20 DIAGNOSIS — M25661 Stiffness of right knee, not elsewhere classified: Secondary | ICD-10-CM

## 2023-02-20 DIAGNOSIS — R2689 Other abnormalities of gait and mobility: Secondary | ICD-10-CM

## 2023-02-20 NOTE — Therapy (Signed)
OUTPATIENT PHYSICAL THERAPY LOWER EXTREMITY TREATMENT     Patient Name: [name] MRN: [id number] DOB:November 24, 1960, 63 y.o., female Today's Date: 02/20/2023  END OF SESSION:  PT End of Session - 02/20/23 1537     Visit Number 58    Date for PT Re-Evaluation 03/06/23    Authorization Type [employer]    PT Start Time 1530    PT Stop Time 1615    PT Time Calculation (min) 45 min    Activity Tolerance Patient tolerated treatment well    Behavior During Therapy WFL for tasks assessed/performed             Past Medical History:  Diagnosis Date   Arthritis    Back pain of lumbar region with sciatica    Cancer (HCC)    breast   Depression    Diabetes mellitus, type II (HCC)    Family history of anesthesia complication    grandfather died under anesthesia 70's- had heart issues   H/O carpal tunnel repair 2014   Headache(784.0)    Hyperlipidemia    Hypertension    Hypothyroidism    Medial meniscus tear    PONV (postoperative nausea and vomiting)    Sleep apnea    cpap since 10   Past Surgical History:  Procedure Laterality Date   BILATERAL TOTAL MASTECTOMY WITH AXILLARY LYMPH NODE DISSECTION     BREAST RECONSTRUCTION     CERVICAL DISC ARTHROPLASTY N/A 03/19/2013   Procedure: CERVICAL FIVE TO SIX, CERVICAL SIX TO SEVEN CERVICAL ANTERIOR DISC ARTHROPLASTY;  Surgeon: Barnett Abu, MD;  Location: [facility];  Service: Neurosurgery;  Laterality: N/A;  C56 C67 artificial disc replacement   CESAREAN SECTION     COLONOSCOPY     DILATATION & CURETTAGE/HYSTEROSCOPY WITH TRUECLEAR N/A 11/25/2013   Procedure: DILATATION & CURETTAGE/HYSTEROSCOPY WITH TRUCLEAR;  Surgeon: Meriel Pica, MD;  Location: [facility];  Service: Gynecology;  Laterality: N/A;   ECTOPIC PREGNANCY SURGERY     GALLBLADDER SURGERY     MOUTH SURGERY     child- dog bite   SPINAL FUSION     2023   STERIOD INJECTION Left 09/09/2022   Procedure: LEFT KNEE STEROID INJECTION;  Surgeon: Ollen Gross, MD;   Location: [facility];  Service: Orthopedics;  Laterality: Left;  left knee injection 0928   TONSILLECTOMY     TOTAL KNEE ARTHROPLASTY Right 09/09/2022   Procedure: TOTAL KNEE ARTHROPLASTY;  Surgeon: Ollen Gross, MD;  Location: [facility];  Service: Orthopedics;  Laterality: Right;   TUBAL LIGATION     Patient Active Problem List   Diagnosis Date Noted   OA (osteoarthritis) of knee 09/09/2022   Exertional dyspnea 04/17/2022   Elevated coronary artery calcium score 04/17/2022   Spondylolisthesis at L4-L5 level 12/27/2021   Ductal carcinoma in situ (DCIS) of left breast 01/19/2018   Generalized anxiety disorder 11/07/2017   Depression 11/07/2017   Insomnia 11/07/2017   Cervical spondylosis 03/19/2013    PCP: Creola Corn  REFERRING PROVIDER: Trudee Grip  REFERRING DIAG: R TKA 09/09/22  THERAPY DIAG:  S/P total knee arthroplasty, right  Acute pain of right knee  Stiffness of right knee, not elsewhere classified  Muscle weakness (generalized)  Localized edema  Other abnormalities of gait and mobility  Rationale for Evaluation and Treatment: Rehabilitation  ONSET DATE: 09/09/22  SUBJECTIVE:   SUBJECTIVE STATEMENT :  Pateint reports a lot of stress in her life and that she was pretty sore in the quads after the last treatment.  R TKA 8/19 L knee steroid injection 09/09/22  PAIN:  Are you having pain? Yes: NPRS scale: 5/10 Pain location: R knee Pain description: sharp medial R knee  Aggravating factors: weight bearing on the RLE, bending, moving it  Relieving factors: ice helps some, pain meds  PRECAUTIONS: None  RED FLAGS: None   WEIGHT BEARING RESTRICTIONS: No  FALLS:  Has patient fallen in last 6 months? No  LIVING ENVIRONMENT: Lives with: lives with their family and lives with their spouse Lives in: House/apartment Stairs: Yes: External: 3 steps; none Has following equipment at home: Single point cane and Walker - 2 wheeled  OCCUPATION: Not working  PLOF:  Independent  PATIENT GOALS: No pain, to keep the knee moving  NEXT MD VISIT: 09/24/22  OBJECTIVE:   PATIENT SURVEYS:  FOTO 4  COGNITION: Overall cognitive status: Within functional limits for tasks assessed      EDEMA:  Increased swelling surrounding R knee  POSTURE: rounded shoulders and flexed trunk   PALPATION: Warm and TTP  LOWER EXTREMITY ROM:  Active ROM Right eval Left eval Right 09/24/22 Right 09/26/22 Right 10/02/22  11/01/22 12/09/22  Hip flexion          Hip extension          Hip abduction          Hip adduction          Hip internal rotation          Hip external rotation          Knee flexion  60  75* AROM, 86 PROM 82* AROM seated per pt request  78* self AAROM, 76* AROM  PROM 87 AROM 81 PROM 90 AROM 83 AROM 101 PROM 105  Knee extension -30  -15* AROM seated, -15 PROM supine   -16* supine heel prop with quad set  AROM 25 PROM 16 AROM 14 AROM 5 PROM 0  Ankle dorsiflexion          Ankle plantarflexion          Ankle inversion          Ankle eversion           (Blank rows = not tested)  LOWER EXTREMITY MMT:  MMT Right eval Left eval  Hip flexion 2   Hip extension    Hip abduction    Hip adduction    Hip internal rotation    Hip external rotation    Knee flexion 2   Knee extension 2   Ankle dorsiflexion    Ankle plantarflexion    Ankle inversion    Ankle eversion     (Blank rows = not tested)   FUNCTIONAL TESTS:  5 times sit to stand: unable to do  Timed up and go (TUG): 1 min 28s  GAIT: Distance walked: in clinic distances Assistive device utilized: Environmental consultant - 2 wheeled Level of assistance: SBA Comments: antalgic gait, slowed cadence, step to pattern, decreased step length and time on RLE    TODAY'S TREATMENT:  DATE:  02/20/23 On rockerboard 2 ways reaching for numbers on wall Up and down 6" stairs step over  step, did step stretch Kneeling down on both knees on very thick pain with weight shift Kneel and get up Split squat right leg in front getting up from kneeling Passive stretch into flexion really stretching the quad, use of t-gun for quad tightness Able to get her easily to 115 degrees flexion  02/17/23 Bike level 4 x 6 minutes 6" stairs 2 flights up and down step over step 6" stair stretch for flexion With quad folded mat kneeling on knees and then getting up from this position with hands on table We also worked on getting up from and staggered kneel, then while sitting on the mat tried to turn over and get up, required cues and some assist but did a lot of problem solving with her on this On bosu upside down reaching for numbers on wall, then on rocker board two ways reaching Leg press right only 20# varying degrees of flexion and her pushing to build strength  02/13/23 Practices kneeling again both knees on wedge pillow, then the left knee on and in a lunge position Went over to the stairs again and practiced up and down step over step with one hand rail and the [facility] Fitter 1 black band, then 1 black and 1 blue band knee extension SAQ 5# Leg press 20# right leg only did a lot of eccentrics to simulate down stairs, then once she was used to this did concentric On rocker board two ways On bosu upside down and flatside down reaching  02/10/23 Bike level 4 x 5 minutes Over to the old building to work on stairs, we did up and down step over step, I did have her pause when going down with the left leg first to get more stretch on the right knee We did come back and practice 6 and 8" step down, very difficult for her We then worked on kneeling down as she has said she wants to be able to go to yoga again and needs to get up and down from the floor, worked on this various ways and how to get up from this position, we were kneeling on the quad fold mat  02/06/23 Nustep level 5 x 5 minutes Bike  level 3 x 5 minutes Went over to the back building and worked on stairs real flights, can go up step over step with the handrail and SPC, can go down step over step but did struggle and needed tactile cues and some encouragement, had left knee pain with this and these are 6" steps, she had to stop after the first flight due to some right knee pain as well came back here and really practiced 4" and 6" steps with two handrails step over step.  Tried various ways to work on quad strength with the ROM on this SAQ and LAQ 5# with cues for TKE  02/03/23 Nustep level 5 x 6 minutes Bike level 3 x 4 minutes Tmill off pushes 4 x 30 seconds Leg press working flexion Passive stretch in prone and in sitting STM to the knee area and quad Passive stretch to 118 degrees flexion  01/30/23 Nustep level 5 x 6 minutes Tmill push 30seconds x 3  Bike level 3 x 6 minutes Leg press 20# Resited gait 50# all directions Passive stretch  got to 118 degrees, AROM after stretch to 112 degrees  01/27/23 Nustep level 5 x  6 mintues Bike level 4 x 5 minutes Elliptical x 2 minutes a lot of help Right leg only 20# leg curls Right leg only 5#  Leg press 20# Passive stretch in prone and in supine  01/24/23 Bike level 4 x 6 minutes Nustep level 5 x 4 minutes Leg curls 25# 2x10 Leg extension10# LEg press 20# working on extension On bosu balance Stairs step over step at the old building full flights with CGA and a lot of verbal cues  01/20/23 Bike level 4 x 6 minutes Nustep level 5 x 5 mintues Tmill push Leg press 20# Leg curls 25# Leg extension 10# Side step on and off airex Passive stretch into flexion  01/16/23 Bike level 3 x 7 minutes Tmill push fwd and backward Resisted gait 40# all directions Leg press position #1 20# sets of 5 and holding 10 seconds after the last rep at the bottom Feet on ball K2C, rotation, bridge, isometric abs STM with the Tgun to the right ITB and quad/hip flexor Passive stretch  right knee working on flexion Passive to 116 degrees flexion  01/13/23 Nustep level 5 x 6 minutes Bike level 2 x 6 minutes Resisted gait 40# all directions Tmill push fwd and backward Leg press 20# position #1 Prone and supine passive knee flexion Use of the Tgun on the right thigh, she had increased soreness and pain in the thigh today  PATIENT EDUCATION:  Education details: POC and HEP Person educated: Patient Education method: Explanation Education comprehension: verbalized understanding  HOME EXERCISE PROGRAM: Access Code: [id number]  ASSESSMENT:  CLINICAL IMPRESSION:  Pateint has a lot of stress in her life, she continues to report not able to do much exercise or stretches, However with just a little stretch she got to 115 passively and 110 degrees actively without much pain.  The kneeling does cause some pain, she is wanting to be able to take a bath in the future so we are starting this as well as she wants to be able to do yoga and be able to get up and down from the floor OBJECTIVE IMPAIRMENTS: Abnormal gait, difficulty walking, decreased ROM, decreased strength, and pain.   ACTIVITY LIMITATIONS: bending, sitting, standing, squatting, sleeping, stairs, transfers, bathing, toileting, and locomotion level  PARTICIPATION LIMITATIONS: meal prep, cleaning, laundry, driving, shopping, and community activity  REHAB POTENTIAL: Good  CLINICAL DECISION MAKING: Stable/uncomplicated  EVALUATION COMPLEXITY: Low   GOALS: Goals reviewed with patient? Yes  SHORT TERM GOALS: Target date: 10/24/22  Patient will be independent with initial HEP. Goal status: 09/20/22 met  2.  Patient will be do TUG < 30s Baseline: 1 min 28s  Goal status: ongoing 10/07/22, 18.57s MET 10/09/22  3.  Patient will be able to do 5 sit to stands  Baseline: unable to do  Goal status: progressing 10/04/22, MET 10/09/22   LONG TERM GOALS: Target date: 01/31/23  Patient will be independent with  advanced/ongoing HEP to improve outcomes and carryover.  Goal status: ongoing 11/06/22  2.  Patient will report at least 75% improvement in R knee pain to improve QOL. Baseline: 10/10 Goal status: met 02/03/23 60% better 11/20/22, 65% 12/06/22, 70% 12/17/22  3.  Patient will demonstrate improved R knee AROM to >/= 0-120 deg to allow for normal gait and stair mechanics. Baseline: 30-0-60 10/09/22: 16-0-78  11/01/22: 14-0-83 11/20/22: 10-97 12/06/22: 10-0-100 12/17/22: 8-0-104 Goal status: continue to progress and getting closer 01/30/23  4.  Patient will demonstrate improved functional LE strength as demonstrated by  5/5. Baseline: 2/5 Goal status: MET 3+/5 10/09/22, 5/5 11/01/22   5.  Patient will be able to ambulate 500' with LRAD and normal gait pattern without increased pain to access community.  Baseline: using RW small in home ambulation distances Goal status: progressing w/SPC 12/02/22, doing some walking without AD   6.  Patient will report 86 on FOTO (patient reported outcome measure) to demonstrate improved functional ability. Baseline: 4, 40-11/11/24 Goal status: MET 53 12/17/22  PLAN:  PT FREQUENCY: 2x/week  PT DURATION: 12 weeks  PLANNED INTERVENTIONS: Therapeutic exercises, Therapeutic activity, Neuromuscular re-education, Balance training, Gait training, Patient/Family education, Self Care, Joint mobilization, Stair training, Electrical stimulation, Cryotherapy, Moist heat, scar mobilization, Vasopneumatic device, and Manual therapy  PLAN FOR NEXT SESSION: continue to push the ROM start to teach her how to do the machines on her own, higher level balance, work on the stairs and up from floor  Patient Details  Name: [name] MRN: [id number] Date of Birth: 1960-03-11 Referring Provider:  Creola Corn, MD  Encounter Date: 02/20/2023   Jearld Lesch, PT 02/20/2023, 4:21 PM

## 2023-02-24 ENCOUNTER — Ambulatory Visit: Payer: No Typology Code available for payment source | Attending: Orthopedic Surgery | Admitting: Physical Therapy

## 2023-02-24 ENCOUNTER — Encounter: Payer: Self-pay | Admitting: Physical Therapy

## 2023-02-24 DIAGNOSIS — M25561 Pain in right knee: Secondary | ICD-10-CM | POA: Insufficient documentation

## 2023-02-24 DIAGNOSIS — M6281 Muscle weakness (generalized): Secondary | ICD-10-CM | POA: Diagnosis present

## 2023-02-24 DIAGNOSIS — Z96651 Presence of right artificial knee joint: Secondary | ICD-10-CM | POA: Diagnosis present

## 2023-02-24 DIAGNOSIS — G8929 Other chronic pain: Secondary | ICD-10-CM | POA: Insufficient documentation

## 2023-02-24 DIAGNOSIS — M549 Dorsalgia, unspecified: Secondary | ICD-10-CM | POA: Insufficient documentation

## 2023-02-24 DIAGNOSIS — R600 Localized edema: Secondary | ICD-10-CM | POA: Diagnosis present

## 2023-02-24 DIAGNOSIS — R2689 Other abnormalities of gait and mobility: Secondary | ICD-10-CM | POA: Insufficient documentation

## 2023-02-24 DIAGNOSIS — M25661 Stiffness of right knee, not elsewhere classified: Secondary | ICD-10-CM | POA: Diagnosis present

## 2023-02-24 NOTE — Therapy (Signed)
OUTPATIENT PHYSICAL THERAPY LOWER EXTREMITY TREATMENT     Patient Name: [name] MRN: [id number] DOB:August 05, 1960, 63 y.o., female Today's Date: 02/24/2023  END OF SESSION:  PT End of Session - 02/24/23 1609     Visit Number 59    Date for PT Re-Evaluation 03/06/23    Authorization Type [employer]    PT Start Time 1530    PT Stop Time 1614    PT Time Calculation (min) 44 min    Activity Tolerance Patient tolerated treatment well    Behavior During Therapy WFL for tasks assessed/performed             Past Medical History:  Diagnosis Date   Arthritis    Back pain of lumbar region with sciatica    Cancer (HCC)    breast   Depression    Diabetes mellitus, type II (HCC)    Family history of anesthesia complication    grandfather died under anesthesia 70's- had heart issues   H/O carpal tunnel repair 2014   Headache(784.0)    Hyperlipidemia    Hypertension    Hypothyroidism    Medial meniscus tear    PONV (postoperative nausea and vomiting)    Sleep apnea    cpap since 10   Past Surgical History:  Procedure Laterality Date   BILATERAL TOTAL MASTECTOMY WITH AXILLARY LYMPH NODE DISSECTION     BREAST RECONSTRUCTION     CERVICAL DISC ARTHROPLASTY N/A 03/19/2013   Procedure: CERVICAL FIVE TO SIX, CERVICAL SIX TO SEVEN CERVICAL ANTERIOR DISC ARTHROPLASTY;  Surgeon: Barnett Abu, MD;  Location: [facility];  Service: Neurosurgery;  Laterality: N/A;  C56 C67 artificial disc replacement   CESAREAN SECTION     COLONOSCOPY     DILATATION & CURETTAGE/HYSTEROSCOPY WITH TRUECLEAR N/A 11/25/2013   Procedure: DILATATION & CURETTAGE/HYSTEROSCOPY WITH TRUCLEAR;  Surgeon: Meriel Pica, MD;  Location: [facility];  Service: Gynecology;  Laterality: N/A;   ECTOPIC PREGNANCY SURGERY     GALLBLADDER SURGERY     MOUTH SURGERY     child- dog bite   SPINAL FUSION     2023   STERIOD INJECTION Left 09/09/2022   Procedure: LEFT KNEE STEROID INJECTION;  Surgeon: Ollen Gross, MD;  Location:  [facility];  Service: Orthopedics;  Laterality: Left;  left knee injection 0928   TONSILLECTOMY     TOTAL KNEE ARTHROPLASTY Right 09/09/2022   Procedure: TOTAL KNEE ARTHROPLASTY;  Surgeon: Ollen Gross, MD;  Location: [facility];  Service: Orthopedics;  Laterality: Right;   TUBAL LIGATION     Patient Active Problem List   Diagnosis Date Noted   OA (osteoarthritis) of knee 09/09/2022   Exertional dyspnea 04/17/2022   Elevated coronary artery calcium score 04/17/2022   Spondylolisthesis at L4-L5 level 12/27/2021   Ductal carcinoma in situ (DCIS) of left breast 01/19/2018   Generalized anxiety disorder 11/07/2017   Depression 11/07/2017   Insomnia 11/07/2017   Cervical spondylosis 03/19/2013    PCP: Creola Corn  REFERRING PROVIDER: Trudee Grip  REFERRING DIAG: R TKA 09/09/22  THERAPY DIAG:  S/P total knee arthroplasty, right  Acute pain of right knee  Stiffness of right knee, not elsewhere classified  Muscle weakness (generalized)  Other abnormalities of gait and mobility  Rationale for Evaluation and Treatment: Rehabilitation  ONSET DATE: 09/09/22  SUBJECTIVE:   SUBJECTIVE STATEMENT :  Pateint reports that she is doing a little better, still stiff and hurting R TKA 8/19 L knee steroid injection 09/09/22  PAIN:  Are  you having pain? Yes: NPRS scale: 5/10 Pain location: R knee Pain description: sharp medial R knee  Aggravating factors: weight bearing on the RLE, bending, moving it  Relieving factors: ice helps some, pain meds  PRECAUTIONS: None  RED FLAGS: None   WEIGHT BEARING RESTRICTIONS: No  FALLS:  Has patient fallen in last 6 months? No  LIVING ENVIRONMENT: Lives with: lives with their family and lives with their spouse Lives in: House/apartment Stairs: Yes: External: 3 steps; none Has following equipment at home: Single point cane and Walker - 2 wheeled  OCCUPATION: Not working  PLOF: Independent  PATIENT GOALS: No pain, to keep the knee  moving  NEXT MD VISIT: 09/24/22  OBJECTIVE:   PATIENT SURVEYS:  FOTO 4  COGNITION: Overall cognitive status: Within functional limits for tasks assessed      EDEMA:  Increased swelling surrounding R knee  POSTURE: rounded shoulders and flexed trunk   PALPATION: Warm and TTP  LOWER EXTREMITY ROM:  Active ROM Right eval Left eval Right 09/24/22 Right 09/26/22 Right 10/02/22  11/01/22 12/09/22  Hip flexion          Hip extension          Hip abduction          Hip adduction          Hip internal rotation          Hip external rotation          Knee flexion  60  75* AROM, 86 PROM 82* AROM seated per pt request  78* self AAROM, 76* AROM  PROM 87 AROM 81 PROM 90 AROM 83 AROM 101 PROM 105  Knee extension -30  -15* AROM seated, -15 PROM supine   -16* supine heel prop with quad set  AROM 25 PROM 16 AROM 14 AROM 5 PROM 0  Ankle dorsiflexion          Ankle plantarflexion          Ankle inversion          Ankle eversion           (Blank rows = not tested)  LOWER EXTREMITY MMT:  MMT Right eval Left eval  Hip flexion 2   Hip extension    Hip abduction    Hip adduction    Hip internal rotation    Hip external rotation    Knee flexion 2   Knee extension 2   Ankle dorsiflexion    Ankle plantarflexion    Ankle inversion    Ankle eversion     (Blank rows = not tested)   FUNCTIONAL TESTS:  5 times sit to stand: unable to do  Timed up and go (TUG): 1 min 28s  GAIT: Distance walked: in clinic distances Assistive device utilized: Environmental consultant - 2 wheeled Level of assistance: SBA Comments: antalgic gait, slowed cadence, step to pattern, decreased step length and time on RLE    TODAY'S TREATMENT:  DATE:  02/24/23 Stairs 2 full flights step over step 6" Rockerboard balance Kneeling on and getting up from quad fold mat Then kneeling weight shift left and  right Kneeling on the left knee with right out in front and getting up Passive stretch to the right knee working on flexion STM with the Tgun working on the soreness and stiffness  02/20/23 On rockerboard 2 ways reaching for numbers on wall Up and down 6" stairs step over step, did step stretch Kneeling down on both knees on very thick pain with weight shift Kneel and get up Split squat right leg in front getting up from kneeling Passive stretch into flexion really stretching the quad, use of t-gun for quad tightness Able to get her easily to 115 degrees flexion  02/17/23 Bike level 4 x 6 minutes 6" stairs 2 flights up and down step over step 6" stair stretch for flexion With quad folded mat kneeling on knees and then getting up from this position with hands on table We also worked on getting up from and staggered kneel, then while sitting on the mat tried to turn over and get up, required cues and some assist but did a lot of problem solving with her on this On bosu upside down reaching for numbers on wall, then on rocker board two ways reaching Leg press right only 20# varying degrees of flexion and her pushing to build strength  02/13/23 Practices kneeling again both knees on wedge pillow, then the left knee on and in a lunge position Went over to the stairs again and practiced up and down step over step with one hand rail and the [facility] Fitter 1 black band, then 1 black and 1 blue band knee extension SAQ 5# Leg press 20# right leg only did a lot of eccentrics to simulate down stairs, then once she was used to this did concentric On rocker board two ways On bosu upside down and flatside down reaching  02/10/23 Bike level 4 x 5 minutes Over to the old building to work on stairs, we did up and down step over step, I did have her pause when going down with the left leg first to get more stretch on the right knee We did come back and practice 6 and 8" step down, very difficult for her We  then worked on kneeling down as she has said she wants to be able to go to yoga again and needs to get up and down from the floor, worked on this various ways and how to get up from this position, we were kneeling on the quad fold mat  02/06/23 Nustep level 5 x 5 minutes Bike level 3 x 5 minutes Went over to the back building and worked on stairs real flights, can go up step over step with the handrail and SPC, can go down step over step but did struggle and needed tactile cues and some encouragement, had left knee pain with this and these are 6" steps, she had to stop after the first flight due to some right knee pain as well came back here and really practiced 4" and 6" steps with two handrails step over step.  Tried various ways to work on quad strength with the ROM on this SAQ and LAQ 5# with cues for TKE  02/03/23 Nustep level 5 x 6 minutes Bike level 3 x 4 minutes Tmill off pushes 4 x 30 seconds Leg press working flexion Passive stretch in prone and in sitting  STM to the knee area and quad Passive stretch to 118 degrees flexion  01/30/23 Nustep level 5 x 6 minutes Tmill push 30seconds x 3  Bike level 3 x 6 minutes Leg press 20# Resited gait 50# all directions Passive stretch  got to 118 degrees, AROM after stretch to 112 degrees  01/27/23 Nustep level 5 x 6 mintues Bike level 4 x 5 minutes Elliptical x 2 minutes a lot of help Right leg only 20# leg curls Right leg only 5#  Leg press 20# Passive stretch in prone and in supine  01/24/23 Bike level 4 x 6 minutes Nustep level 5 x 4 minutes Leg curls 25# 2x10 Leg extension10# LEg press 20# working on extension On bosu balance Stairs step over step at the old building full flights with CGA and a lot of verbal cues PATIENT EDUCATION:  Education details: POC and HEP Person educated: Patient Education method: Explanation Education comprehension: verbalized understanding  HOME EXERCISE PROGRAM: Access Code:  [id number]  ASSESSMENT:  CLINICAL IMPRESSION:  Pateint was able to do a little more exercise this weekend, still sore and painful with flexion, she does the first flight of stairs going down very well but after that she really is sore and the quality of the descent really suffers.  The kneeling does cause some pain, she is wanting to be able to take a bath in the future so we are starting this as well as she wants to be able to do yoga and be able to get up and down from the floor OBJECTIVE IMPAIRMENTS: Abnormal gait, difficulty walking, decreased ROM, decreased strength, and pain.   ACTIVITY LIMITATIONS: bending, sitting, standing, squatting, sleeping, stairs, transfers, bathing, toileting, and locomotion level  PARTICIPATION LIMITATIONS: meal prep, cleaning, laundry, driving, shopping, and community activity  REHAB POTENTIAL: Good  CLINICAL DECISION MAKING: Stable/uncomplicated  EVALUATION COMPLEXITY: Low   GOALS: Goals reviewed with patient? Yes  SHORT TERM GOALS: Target date: 10/24/22  Patient will be independent with initial HEP. Goal status: 09/20/22 met  2.  Patient will be do TUG < 30s Baseline: 1 min 28s  Goal status: ongoing 10/07/22, 18.57s MET 10/09/22  3.  Patient will be able to do 5 sit to stands  Baseline: unable to do  Goal status: progressing 10/04/22, MET 10/09/22   LONG TERM GOALS: Target date: 01/31/23  Patient will be independent with advanced/ongoing HEP to improve outcomes and carryover.  Goal status: ongoing 11/06/22  2.  Patient will report at least 75% improvement in R knee pain to improve QOL. Baseline: 10/10 Goal status: met 02/03/23 60% better 11/20/22, 65% 12/06/22, 70% 12/17/22  3.  Patient will demonstrate improved R knee AROM to >/= 0-120 deg to allow for normal gait and stair mechanics. Baseline: 30-0-60 10/09/22: 16-0-78  11/01/22: 14-0-83 11/20/22: 10-97 12/06/22: 10-0-100 12/17/22: 8-0-104 Goal status: continue to progress and getting  closer 01/30/23  4.  Patient will demonstrate improved functional LE strength as demonstrated by 5/5. Baseline: 2/5 Goal status: MET 3+/5 10/09/22, 5/5 11/01/22   5.  Patient will be able to ambulate 500' with LRAD and normal gait pattern without increased pain to access community.  Baseline: using RW small in home ambulation distances Goal status: progressing w/SPC 12/02/22, doing some walking without AD   6.  Patient will report 32 on FOTO (patient reported outcome measure) to demonstrate improved functional ability. Baseline: 4, 40-11/11/24 Goal status: MET 53 12/17/22  PLAN:  PT FREQUENCY: 2x/week  PT DURATION: 12 weeks  PLANNED INTERVENTIONS:  Therapeutic exercises, Therapeutic activity, Neuromuscular re-education, Balance training, Gait training, Patient/Family education, Self Care, Joint mobilization, Stair training, Electrical stimulation, Cryotherapy, Moist heat, scar mobilization, Vasopneumatic device, and Manual therapy  PLAN FOR NEXT SESSION: continue to push the ROM start to teach her how to do the machines on her own, higher level balance, work on the stairs and up from floor  Patient Details  Name: JANEI SCHEFF MRN: [id number] Date of Birth: 10-03-1960 Referring Provider:  Ollen Gross, MD  Encounter Date: 02/24/2023   Jearld Lesch, PT 02/24/2023, 5:39 PM

## 2023-02-26 ENCOUNTER — Encounter: Payer: Self-pay | Admitting: Physical Therapy

## 2023-02-26 ENCOUNTER — Ambulatory Visit: Payer: No Typology Code available for payment source | Admitting: Physical Therapy

## 2023-02-26 DIAGNOSIS — M25661 Stiffness of right knee, not elsewhere classified: Secondary | ICD-10-CM

## 2023-02-26 DIAGNOSIS — R600 Localized edema: Secondary | ICD-10-CM

## 2023-02-26 DIAGNOSIS — R2689 Other abnormalities of gait and mobility: Secondary | ICD-10-CM

## 2023-02-26 DIAGNOSIS — Z96651 Presence of right artificial knee joint: Secondary | ICD-10-CM

## 2023-02-26 DIAGNOSIS — M6281 Muscle weakness (generalized): Secondary | ICD-10-CM

## 2023-02-26 DIAGNOSIS — M25561 Pain in right knee: Secondary | ICD-10-CM

## 2023-02-26 NOTE — Therapy (Signed)
 OUTPATIENT PHYSICAL THERAPY LOWER EXTREMITY TREATMENT     Patient Name: [name] MRN: [id number] DOB:[birth year], 63 y.o., female Today's Date: 02/26/2023  END OF SESSION:  PT End of Session - 02/26/23 1707     Visit Number 60    Date for PT Re-Evaluation 03/06/23    Authorization Type [employer]    PT Start Time 1615    PT Stop Time 1705    PT Time Calculation (min) 50 min    Activity Tolerance Patient tolerated treatment well    Behavior During Therapy WFL for tasks assessed/performed             Past Medical History:  Diagnosis Date   Arthritis    Back pain of lumbar region with sciatica    Cancer (HCC)    breast   Depression    Diabetes mellitus, type II (HCC)    Family history of anesthesia complication    grandfather died under anesthesia 70's- had heart issues   H/O carpal tunnel repair 2014   Headache(784.0)    Hyperlipidemia    Hypertension    Hypothyroidism    Medial meniscus tear    PONV (postoperative nausea and vomiting)    Sleep apnea    cpap since 10   Past Surgical History:  Procedure Laterality Date   BILATERAL TOTAL MASTECTOMY WITH AXILLARY LYMPH NODE DISSECTION     BREAST RECONSTRUCTION     CERVICAL DISC ARTHROPLASTY N/A 03/19/2013   Procedure: CERVICAL FIVE TO SIX, CERVICAL SIX TO SEVEN CERVICAL ANTERIOR DISC ARTHROPLASTY;  Surgeon: Victory Gens, MD;  Location: [facility];  Service: Neurosurgery;  Laterality: N/A;  C56 C67 artificial disc replacement   CESAREAN SECTION     COLONOSCOPY     DILATATION & CURETTAGE/HYSTEROSCOPY WITH TRUECLEAR N/A 11/25/2013   Procedure: DILATATION & CURETTAGE/HYSTEROSCOPY WITH TRUCLEAR;  Surgeon: Charlie CHRISTELLA Croak, MD;  Location: [facility];  Service: Gynecology;  Laterality: N/A;   ECTOPIC PREGNANCY SURGERY     GALLBLADDER SURGERY     MOUTH SURGERY     child- dog bite   SPINAL FUSION     2023   STERIOD INJECTION Left 09/09/2022   Procedure: LEFT KNEE STEROID INJECTION;  Surgeon: Melodi Lerner, MD;  Location:  [facility];  Service: Orthopedics;  Laterality: Left;  left knee injection 0928   TONSILLECTOMY     TOTAL KNEE ARTHROPLASTY Right 09/09/2022   Procedure: TOTAL KNEE ARTHROPLASTY;  Surgeon: Melodi Lerner, MD;  Location: [facility];  Service: Orthopedics;  Laterality: Right;   TUBAL LIGATION     Patient Active Problem List   Diagnosis Date Noted   OA (osteoarthritis) of knee 09/09/2022   Exertional dyspnea 04/17/2022   Elevated coronary artery calcium  score 04/17/2022   Spondylolisthesis at L4-L5 level 12/27/2021   Ductal carcinoma in situ (DCIS) of left breast 01/19/2018   Generalized anxiety disorder 11/07/2017   Depression 11/07/2017   Insomnia 11/07/2017   Cervical spondylosis 03/19/2013    PCP: Norleen Jungling  REFERRING PROVIDER: Lerner Lawn  REFERRING DIAG: R TKA 09/09/22  THERAPY DIAG:  S/P total knee arthroplasty, right  Acute pain of right knee  Stiffness of right knee, not elsewhere classified  Muscle weakness (generalized)  Other abnormalities of gait and mobility  Localized edema  Rationale for Evaluation and Treatment: Rehabilitation  ONSET DATE: 09/09/22  SUBJECTIVE:   SUBJECTIVE STATEMENT :  Patient came in walking without cane today and looked good, minimal deviation R TKA 8/19 L knee steroid injection 09/09/22  PAIN:  Are you having pain? Yes: NPRS scale: 3/10 Pain location: R knee Pain description: sharp medial R knee  Aggravating factors: weight bearing on the RLE, bending, moving it  Relieving factors: ice helps some, pain meds  PRECAUTIONS: None  RED FLAGS: None   WEIGHT BEARING RESTRICTIONS: No  FALLS:  Has patient fallen in last 6 months? No  LIVING ENVIRONMENT: Lives with: lives with their family and lives with their spouse Lives in: House/apartment Stairs: Yes: External: 3 steps; none Has following equipment at home: Single point cane and Walker - 2 wheeled  OCCUPATION: Not working  PLOF: Independent  PATIENT GOALS: No pain, to keep  the knee moving  NEXT MD VISIT: 09/24/22  OBJECTIVE:   PATIENT SURVEYS:  FOTO 4  COGNITION: Overall cognitive status: Within functional limits for tasks assessed      EDEMA:  Increased swelling surrounding R knee  POSTURE: rounded shoulders and flexed trunk   PALPATION: Warm and TTP  LOWER EXTREMITY ROM:  Active ROM Right eval Left eval Right 09/24/22 Right 09/26/22 Right 10/02/22  11/01/22 12/09/22  Hip flexion          Hip extension          Hip abduction          Hip adduction          Hip internal rotation          Hip external rotation          Knee flexion  60  75* AROM, 86 PROM 82* AROM seated per pt request  78* self AAROM, 76* AROM  PROM 87 AROM 81 PROM 90 AROM 83 AROM 101 PROM 105  Knee extension -30  -15* AROM seated, -15 PROM supine   -16* supine heel prop with quad set  AROM 25 PROM 16 AROM 14 AROM 5 PROM 0  Ankle dorsiflexion          Ankle plantarflexion          Ankle inversion          Ankle eversion           (Blank rows = not tested)  LOWER EXTREMITY MMT:  MMT Right eval Left eval  Hip flexion 2   Hip extension    Hip abduction    Hip adduction    Hip internal rotation    Hip external rotation    Knee flexion 2   Knee extension 2   Ankle dorsiflexion    Ankle plantarflexion    Ankle inversion    Ankle eversion     (Blank rows = not tested)   FUNCTIONAL TESTS:  5 times sit to stand: unable to do  Timed up and go (TUG): 1 min 28s  GAIT: Distance walked: in clinic distances Assistive device utilized: Environmental Consultant - 2 wheeled Level of assistance: SBA Comments: antalgic gait, slowed cadence, step to pattern, decreased step length and time on RLE    TODAY'S TREATMENT:  DATE:  02/26/23 Stairs 6 step overstep with handrail and very light HHA 8 step up with the right, repeated with SPC and counter 8 stair down with  SPC and counter for support and PT hand on knee to give some support, (stepping down with the good leg) Used quad fold mat on ground and the garden buddy to simulate bathtub sitting and standing this was bottom 13 off the ground, we then tried double fold and now was 8 off the ground.  We then tried to get down on the ground walking her hands out and then kneeling and rolling over, then worked on long sitting and then rolling over and getting gup by walking hands back Passive stretch  02/24/23 Stairs 2 full flights step over step 6 Rockerboard balance Kneeling on and getting up from quad fold mat Then kneeling weight shift left and right Kneeling on the left knee with right out in front and getting up Passive stretch to the right knee working on flexion STM with the Tgun working on the soreness and stiffness  02/20/23 On rockerboard 2 ways reaching for numbers on wall Up and down 6 stairs step over step, did step stretch Kneeling down on both knees on very thick pain with weight shift Kneel and get up Split squat right leg in front getting up from kneeling Passive stretch into flexion really stretching the quad, use of t-gun for quad tightness Able to get her easily to 115 degrees flexion  02/17/23 Bike level 4 x 6 minutes 6 stairs 2 flights up and down step over step 6 stair stretch for flexion With quad folded mat kneeling on knees and then getting up from this position with hands on table We also worked on getting up from and staggered kneel, then while sitting on the mat tried to turn over and get up, required cues and some assist but did a lot of problem solving with her on this On bosu upside down reaching for numbers on wall, then on rocker board two ways reaching Leg press right only 20# varying degrees of flexion and her pushing to build strength  02/13/23 Practices kneeling again both knees on wedge pillow, then the left knee on and in a lunge position Went over to the  stairs again and practiced up and down step over step with one hand rail and the [facility] F[employer] 1 black band, then 1 black and 1 blue band knee extension SAQ 5# Leg press 20# right leg only did a lot of eccentrics to simulate down stairs, then once she was used to this did concentric On rocker board two ways On bosu upside down and flatside down reaching  02/10/23 Bike level 4 x 5 minutes Over to the old building to work on stairs, we did up and down step over step, I did have her pause when going down with the left leg first to get more stretch on the right knee We did come back and practice 6 and 8 step down, very difficult for her We then worked on kneeling down as she has said she wants to be able to go to yoga again and needs to get up and down from the floor, worked on this various ways and how to get up from this position, we were kneeling on the quad fold mat  02/06/23 Nustep level 5 x 5 minutes Bike level 3 x 5 minutes Went over to the back building and worked on stairs real flights, can go up step  over step with the handrail and SPC, can go down step over step but did struggle and needed tactile cues and some encouragement, had left knee pain with this and these are 6 steps, she had to stop after the first flight due to some right knee pain as well came back here and really practiced 4 and 6 steps with two handrails step over step.  Tried various ways to work on quad strength with the ROM on this SAQ and LAQ 5# with cues for TKE  02/03/23 Nustep level 5 x 6 minutes Bike level 3 x 4 minutes Tmill off pushes 4 x 30 seconds Leg press working flexion Passive stretch in prone and in sitting STM to the knee area and quad Passive stretch to 118 degrees flexion  01/30/23 Nustep level 5 x 6 minutes Tmill push 30seconds x 3  Bike level 3 x 6 minutes Leg press 20# Resited gait 50# all directions Passive stretch  got to 118 degrees, AROM after stretch to 112 degrees  PATIENT EDUCATION:   Education details: POC and HEP Person educated: Patient Education method: Explanation Education comprehension: verbalized understanding  HOME EXERCISE PROGRAM: Access Code: [id number]  ASSESSMENT:  CLINICAL IMPRESSION:  We did a lot of work on getting up and down from lower sitting heights, trying to replicate the bathtub, as she would like to be able to take a bath in the future.  WE are also working on the 8 steps as her stairs at home are 8, trying to problems solve and I will go to her house in the near future to assess and see what the biggest issues are OBJECTIVE IMPAIRMENTS: Abnormal gait, difficulty walking, decreased ROM, decreased strength, and pain.   ACTIVITY LIMITATIONS: bending, sitting, standing, squatting, sleeping, stairs, transfers, bathing, toileting, and locomotion level  PARTICIPATION LIMITATIONS: meal prep, cleaning, laundry, driving, shopping, and community activity  REHAB POTENTIAL: Good  CLINICAL DECISION MAKING: Stable/uncomplicated  EVALUATION COMPLEXITY: Low   GOALS: Goals reviewed with patient? Yes  SHORT TERM GOALS: Target date: 10/24/22  Patient will be independent with initial HEP. Goal status: 09/20/22 met  2.  Patient will be do TUG < 30s Baseline: 1 min 28s  Goal status: ongoing 10/07/22, 18.57s MET 10/09/22  3.  Patient will be able to do 5 sit to stands  Baseline: unable to do  Goal status: progressing 10/04/22, MET 10/09/22   LONG TERM GOALS: Target date: 01/31/23  Patient will be independent with advanced/ongoing HEP to improve outcomes and carryover.  Goal status: ongoing 11/06/22  2.  Patient will report at least 75% improvement in R knee pain to improve QOL. Baseline: 10/10 Goal status: met 02/03/23 60% better 11/20/22, 65% 12/06/22, 70% 12/17/22  3.  Patient will demonstrate improved R knee AROM to >/= 0-120 deg to allow for normal gait and stair mechanics. Baseline: 30-0-60 10/09/22: 16-0-78  11/01/22: 14-0-83 11/20/22:  10-97 12/06/22: 10-0-100 12/17/22: 8-0-104 Goal status: continue to progress and getting closer 01/30/23  4.  Patient will demonstrate improved functional LE strength as demonstrated by 5/5. Baseline: 2/5 Goal status: MET 3+/5 10/09/22, 5/5 11/01/22   5.  Patient will be able to ambulate 500' with LRAD and normal gait pattern without increased pain to access community.  Baseline: using RW small in home ambulation distances Goal status: progressing w/SPC 12/02/22, doing some walking without AD   6.  Patient will report 41 on FOTO (patient reported outcome measure) to demonstrate improved functional ability. Baseline: 4, 40-11/11/24 Goal status: MET 53  12/17/22  PLAN:  PT FREQUENCY: 2x/week  PT DURATION: 12 weeks  PLANNED INTERVENTIONS: Therapeutic exercises, Therapeutic activity, Neuromuscular re-education, Balance training, Gait training, Patient/Family education, Self Care, Joint mobilization, Stair training, Electrical stimulation, Cryotherapy, Moist heat, scar mobilization, Vasopneumatic device, and Manual therapy  PLAN FOR NEXT SESSION: continue to push the ROM start to teach her how to do the machines on her own, higher level balance, work on the stairs and up from floor as well as the bathtub simulation  Patient Details  Name: JAREE DWIGHT MRN: [id number] Date of Birth: [birth year] Referring Provider:  Melodi Lerner, MD  Encounter Date: 02/26/2023   OBADIAH OZELL ORN, PT 02/26/2023, 5:07 PM

## 2023-03-03 ENCOUNTER — Encounter: Payer: Self-pay | Admitting: Physical Therapy

## 2023-03-03 ENCOUNTER — Ambulatory Visit: Payer: No Typology Code available for payment source | Admitting: Physical Therapy

## 2023-03-03 DIAGNOSIS — M6281 Muscle weakness (generalized): Secondary | ICD-10-CM

## 2023-03-03 DIAGNOSIS — M25661 Stiffness of right knee, not elsewhere classified: Secondary | ICD-10-CM

## 2023-03-03 DIAGNOSIS — M25561 Pain in right knee: Secondary | ICD-10-CM

## 2023-03-03 DIAGNOSIS — Z96651 Presence of right artificial knee joint: Secondary | ICD-10-CM

## 2023-03-03 DIAGNOSIS — R2689 Other abnormalities of gait and mobility: Secondary | ICD-10-CM

## 2023-03-03 NOTE — Therapy (Signed)
 OUTPATIENT PHYSICAL THERAPY LOWER EXTREMITY TREATMENT     Patient Name: [name] MRN: [id number] DOB:[birth year], 63 y.o., female Today's Date: 03/03/2023  END OF SESSION:  PT End of Session - 03/03/23 1614     Visit Number 61    Date for PT Re-Evaluation 03/06/23    Authorization Type [employer]    PT Start Time 1530    PT Stop Time 1615    PT Time Calculation (min) 45 min    Activity Tolerance Patient tolerated treatment well    Behavior During Therapy WFL for tasks assessed/performed             Past Medical History:  Diagnosis Date   Arthritis    Back pain of lumbar region with sciatica    Cancer (HCC)    breast   Depression    Diabetes mellitus, type II (HCC)    Family history of anesthesia complication    grandfather died under anesthesia 70's- had heart issues   H/O carpal tunnel repair 2014   Headache(784.0)    Hyperlipidemia    Hypertension    Hypothyroidism    Medial meniscus tear    PONV (postoperative nausea and vomiting)    Sleep apnea    cpap since 10   Past Surgical History:  Procedure Laterality Date   BILATERAL TOTAL MASTECTOMY WITH AXILLARY LYMPH NODE DISSECTION     BREAST RECONSTRUCTION     CERVICAL DISC ARTHROPLASTY N/A 03/19/2013   Procedure: CERVICAL FIVE TO SIX, CERVICAL SIX TO SEVEN CERVICAL ANTERIOR DISC ARTHROPLASTY;  Surgeon: Elna Haggis, MD;  Location: [facility];  Service: Neurosurgery;  Laterality: N/A;  C56 C67 artificial disc replacement   CESAREAN SECTION     COLONOSCOPY     DILATATION & CURETTAGE/HYSTEROSCOPY WITH TRUECLEAR N/A 11/25/2013   Procedure: DILATATION & CURETTAGE/HYSTEROSCOPY WITH TRUCLEAR;  Surgeon: Piedad Brewer, MD;  Location: [facility];  Service: Gynecology;  Laterality: N/A;   ECTOPIC PREGNANCY SURGERY     GALLBLADDER SURGERY     MOUTH SURGERY     child- dog bite   SPINAL FUSION     2023   STERIOD INJECTION Left 09/09/2022   Procedure: LEFT KNEE STEROID INJECTION;  Surgeon: Liliane Rei, MD;   Location: [facility];  Service: Orthopedics;  Laterality: Left;  left knee injection 0928   TONSILLECTOMY     TOTAL KNEE ARTHROPLASTY Right 09/09/2022   Procedure: TOTAL KNEE ARTHROPLASTY;  Surgeon: Liliane Rei, MD;  Location: [facility];  Service: Orthopedics;  Laterality: Right;   TUBAL LIGATION     Patient Active Problem List   Diagnosis Date Noted   OA (osteoarthritis) of knee 09/09/2022   Exertional dyspnea 04/17/2022   Elevated coronary artery calcium  score 04/17/2022   Spondylolisthesis at L4-L5 level 12/27/2021   Ductal carcinoma in situ (DCIS) of left breast 01/19/2018   Generalized anxiety disorder 11/07/2017   Depression 11/07/2017   Insomnia 11/07/2017   Cervical spondylosis 03/19/2013    PCP: Margarete Sharps  REFERRING PROVIDER: Athena Lazier  REFERRING DIAG: R TKA 09/09/22  THERAPY DIAG:  S/P total knee arthroplasty, right  Acute pain of right knee  Stiffness of right knee, not elsewhere classified  Muscle weakness (generalized)  Other abnormalities of gait and mobility  Rationale for Evaluation and Treatment: Rehabilitation  ONSET DATE: 09/09/22  SUBJECTIVE:   SUBJECTIVE STATEMENT :  Patient with increased back and hip pain, pain in the back and the hip 6/10, reports that she was on her feet some this weekend  R TKA 8/19 L knee steroid injection 09/09/22  PAIN:  Are you having pain? Yes: NPRS scale: 3/10 Pain location: R knee Pain description: sharp medial R knee  Aggravating factors: weight bearing on the RLE, bending, moving it  Relieving factors: ice helps some, pain meds  PRECAUTIONS: None  RED FLAGS: None   WEIGHT BEARING RESTRICTIONS: No  FALLS:  Has patient fallen in last 6 months? No  LIVING ENVIRONMENT: Lives with: lives with their family and lives with their spouse Lives in: House/apartment Stairs: Yes: External: 3 steps; none Has following equipment at home: Single point cane and Walker - 2 wheeled  OCCUPATION: Not working  PLOF:  Independent  PATIENT GOALS: No pain, to keep the knee moving  NEXT MD VISIT: 09/24/22  OBJECTIVE:   PATIENT SURVEYS:  FOTO 4  COGNITION: Overall cognitive status: Within functional limits for tasks assessed      EDEMA:  Increased swelling surrounding R knee  POSTURE: rounded shoulders and flexed trunk   PALPATION: Warm and TTP  LOWER EXTREMITY ROM:  Active ROM Right eval Left eval Right 09/24/22 Right 09/26/22 Right 10/02/22  11/01/22 12/09/22  Hip flexion          Hip extension          Hip abduction          Hip adduction          Hip internal rotation          Hip external rotation          Knee flexion  60  75* AROM, 86 PROM 82* AROM seated per pt request  78* self AAROM, 76* AROM  PROM 87 AROM 81 PROM 90 AROM 83 AROM 101 PROM 105  Knee extension -30  -15* AROM seated, -15 PROM supine   -16* supine heel prop with quad set  AROM 25 PROM 16 AROM 14 AROM 5 PROM 0  Ankle dorsiflexion          Ankle plantarflexion          Ankle inversion          Ankle eversion           (Blank rows = not tested)  LOWER EXTREMITY MMT:  MMT Right eval Left eval  Hip flexion 2   Hip extension    Hip abduction    Hip adduction    Hip internal rotation    Hip external rotation    Knee flexion 2   Knee extension 2   Ankle dorsiflexion    Ankle plantarflexion    Ankle inversion    Ankle eversion     (Blank rows = not tested)   FUNCTIONAL TESTS:  5 times sit to stand: unable to do  Timed up and go (TUG): 1 min 28s  GAIT: Distance walked: in clinic distances Assistive device utilized: Environmental consultant - 2 wheeled Level of assistance: SBA Comments: antalgic gait, slowed cadence, step to pattern, decreased step length and time on RLE    TODAY'S TREATMENT:  DATE:  03/03/23 On wobble board balance On bosu reaching for numbers on wall On airex reaching for  numbers on wall 6" steps with stretch Tmill push Passive stretch of the right knee, hip flexor and quad STM to the quad, back and buttock  02/26/23 Stairs 6" step overstep with handrail and very light HHA 8" step up with the right, repeated with SPC and counter 8" stair down with SPC and counter for support and PT hand on knee to give some support, (stepping down with the good leg) Used quad fold mat on ground and the garden buddy to simulate bathtub sitting and standing this was bottom 13" off the ground, we then tried double fold and now was 8" off the ground.  We then tried to get down on the ground walking her hands out and then kneeling and rolling over, then worked on long sitting and then rolling over and getting gup by walking hands back Passive stretch  02/24/23 Stairs 2 full flights step over step 6" Rockerboard balance Kneeling on and getting up from quad fold mat Then kneeling weight shift left and right Kneeling on the left knee with right out in front and getting up Passive stretch to the right knee working on flexion STM with the Tgun working on the soreness and stiffness  02/20/23 On rockerboard 2 ways reaching for numbers on wall Up and down 6" stairs step over step, did step stretch Kneeling down on both knees on very thick pain with weight shift Kneel and get up Split squat right leg in front getting up from kneeling Passive stretch into flexion really stretching the quad, use of t-gun for quad tightness Able to get her easily to 115 degrees flexion  02/17/23 Bike level 4 x 6 minutes 6" stairs 2 flights up and down step over step 6" stair stretch for flexion With quad folded mat kneeling on knees and then getting up from this position with hands on table We also worked on getting up from and staggered kneel, then while sitting on the mat tried to turn over and get up, required cues and some assist but did a lot of problem solving with her on this On bosu upside down  reaching for numbers on wall, then on rocker board two ways reaching Leg press right only 20# varying degrees of flexion and her pushing to build strength  02/13/23 Practices kneeling again both knees on wedge pillow, then the left knee on and in a lunge position Went over to the stairs again and practiced up and down step over step with one hand rail and the [facility] Fitter 1 black band, then 1 black and 1 blue band knee extension SAQ 5# Leg press 20# right leg only did a lot of eccentrics to simulate down stairs, then once she was used to this did concentric On rocker board two ways On bosu upside down and flatside down reaching  02/10/23 Bike level 4 x 5 minutes Over to the old building to work on stairs, we did up and down step over step, I did have her pause when going down with the left leg first to get more stretch on the right knee We did come back and practice 6 and 8" step down, very difficult for her We then worked on kneeling down as she has said she wants to be able to go to yoga again and needs to get up and down from the floor, worked on this various ways and how to  get up from this position, we were kneeling on the quad fold mat  02/06/23 Nustep level 5 x 5 minutes Bike level 3 x 5 minutes Went over to the back building and worked on stairs real flights, can go up step over step with the handrail and SPC, can go down step over step but did struggle and needed tactile cues and some encouragement, had left knee pain with this and these are 6" steps, she had to stop after the first flight due to some right knee pain as well came back here and really practiced 4" and 6" steps with two handrails step over step.  Tried various ways to work on Systems developer with the ROM on this SAQ and LAQ 5# with cues for TKE  PATIENT EDUCATION:  Education details: POC and HEP Person educated: Patient Education method: Explanation Education comprehension: verbalized understanding  HOME EXERCISE  PROGRAM: Access Code: [id number]  ASSESSMENT:  CLINICAL IMPRESSION:  Patient with increase hip and LBP, she is unsure of why, she does report doing her bike at home daily, also reports that she was on her feet, she had back fusion surgery this past year.  She was tight in the buttock and the knee and quad, hip flexor.  She is doing well with the balance, still has issues with the stairs, here is 6" and her house has 8" OBJECTIVE IMPAIRMENTS: Abnormal gait, difficulty walking, decreased ROM, decreased strength, and pain.   ACTIVITY LIMITATIONS: bending, sitting, standing, squatting, sleeping, stairs, transfers, bathing, toileting, and locomotion level  PARTICIPATION LIMITATIONS: meal prep, cleaning, laundry, driving, shopping, and community activity  REHAB POTENTIAL: Good  CLINICAL DECISION MAKING: Stable/uncomplicated  EVALUATION COMPLEXITY: Low   GOALS: Goals reviewed with patient? Yes  SHORT TERM GOALS: Target date: 10/24/22  Patient will be independent with initial HEP. Goal status: 09/20/22 met  2.  Patient will be do TUG < 30s Baseline: 1 min 28s  Goal status: ongoing 10/07/22, 18.57s MET 10/09/22  3.  Patient will be able to do 5 sit to stands  Baseline: unable to do  Goal status: progressing 10/04/22, MET 10/09/22   LONG TERM GOALS: Target date: 01/31/23  Patient will be independent with advanced/ongoing HEP to improve outcomes and carryover.  Goal status: ongoing 11/06/22  2.  Patient will report at least 75% improvement in R knee pain to improve QOL. Baseline: 10/10 Goal status: met 02/03/23 60% better 11/20/22, 65% 12/06/22, 70% 12/17/22  3.  Patient will demonstrate improved R knee AROM to >/= 0-120 deg to allow for normal gait and stair mechanics. Baseline: 30-0-60 10/09/22: 16-0-78  11/01/22: 14-0-83 11/20/22: 10-97 12/06/22: 10-0-100 12/17/22: 8-0-104 Goal status: continue to progress and getting closer 01/30/23  4.  Patient will demonstrate improved  functional LE strength as demonstrated by 5/5. Baseline: 2/5 Goal status: MET 3+/5 10/09/22, 5/5 11/01/22   5.  Patient will be able to ambulate 500' with LRAD and normal gait pattern without increased pain to access community.  Baseline: using RW small in home ambulation distances Goal status: progressing w/SPC 12/02/22, doing some walking without AD   6.  Patient will report 33 on FOTO (patient reported outcome measure) to demonstrate improved functional ability. Baseline: 4, 40-11/11/24 Goal status: MET 53 12/17/22  PLAN:  PT FREQUENCY: 2x/week  PT DURATION: 12 weeks  PLANNED INTERVENTIONS: Therapeutic exercises, Therapeutic activity, Neuromuscular re-education, Balance training, Gait training, Patient/Family education, Self Care, Joint mobilization, Stair training, Electrical stimulation, Cryotherapy, Moist heat, scar mobilization, Vasopneumatic device, and Manual therapy  PLAN FOR NEXT SESSION: continue to push the ROM start to teach her how to do the machines on her own, higher level balance, work on the stairs and up from floor as well as the bathtub simulation  Patient Details  Name: [name] MRN: [id number] Date of Birth: 1960-12-15 Referring Provider:  Liliane Rei, MD  Encounter Date: 03/03/2023   Hollis Lurie, PT 03/03/2023, 4:15 PM

## 2023-03-04 ENCOUNTER — Ambulatory Visit: Payer: No Typology Code available for payment source | Admitting: Professional Counselor

## 2023-03-04 ENCOUNTER — Encounter: Payer: Self-pay | Admitting: Professional Counselor

## 2023-03-04 DIAGNOSIS — F411 Generalized anxiety disorder: Secondary | ICD-10-CM
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
## 2023-03-06 ENCOUNTER — Ambulatory Visit: Payer: No Typology Code available for payment source | Admitting: Physical Therapy

## 2023-03-06 ENCOUNTER — Encounter: Payer: Self-pay | Admitting: Physical Therapy

## 2023-03-06 DIAGNOSIS — Z96651 Presence of right artificial knee joint: Secondary | ICD-10-CM

## 2023-03-06 DIAGNOSIS — M25661 Stiffness of right knee, not elsewhere classified: Secondary | ICD-10-CM

## 2023-03-06 DIAGNOSIS — R2689 Other abnormalities of gait and mobility: Secondary | ICD-10-CM

## 2023-03-06 DIAGNOSIS — M6281 Muscle weakness (generalized): Secondary | ICD-10-CM

## 2023-03-06 DIAGNOSIS — M25561 Pain in right knee: Secondary | ICD-10-CM

## 2023-03-06 DIAGNOSIS — R600 Localized edema: Secondary | ICD-10-CM

## 2023-03-06 NOTE — Therapy (Signed)
OUTPATIENT PHYSICAL THERAPY LOWER EXTREMITY TREATMENT     Patient Name: [name] MRN: [id number] DOB:07/06/1960, 63 y.o., female Today's Date: 03/06/2023  END OF SESSION:  PT End of Session - 03/06/23 1612     Visit Number 62    Authorization Type [employer]    PT Start Time 1530    PT Stop Time 1615    PT Time Calculation (min) 45 min    Activity Tolerance Patient tolerated treatment well    Behavior During Therapy WFL for tasks assessed/performed             Past Medical History:  Diagnosis Date   Arthritis    Back pain of lumbar region with sciatica    Cancer (HCC)    breast   Depression    Diabetes mellitus, type II (HCC)    Family history of anesthesia complication    grandfather died under anesthesia 70's- had heart issues   H/O carpal tunnel repair 2014   Headache(784.0)    Hyperlipidemia    Hypertension    Hypothyroidism    Medial meniscus tear    PONV (postoperative nausea and vomiting)    Sleep apnea    cpap since 10   Past Surgical History:  Procedure Laterality Date   BILATERAL TOTAL MASTECTOMY WITH AXILLARY LYMPH NODE DISSECTION     BREAST RECONSTRUCTION     CERVICAL DISC ARTHROPLASTY N/A 03/19/2013   Procedure: CERVICAL FIVE TO SIX, CERVICAL SIX TO SEVEN CERVICAL ANTERIOR DISC ARTHROPLASTY;  Surgeon: Barnett Abu, MD;  Location: [facility];  Service: Neurosurgery;  Laterality: N/A;  C56 C67 artificial disc replacement   CESAREAN SECTION     COLONOSCOPY     DILATATION & CURETTAGE/HYSTEROSCOPY WITH TRUECLEAR N/A 11/25/2013   Procedure: DILATATION & CURETTAGE/HYSTEROSCOPY WITH TRUCLEAR;  Surgeon: Meriel Pica, MD;  Location: [facility];  Service: Gynecology;  Laterality: N/A;   ECTOPIC PREGNANCY SURGERY     GALLBLADDER SURGERY     MOUTH SURGERY     child- dog bite   SPINAL FUSION     2023   STERIOD INJECTION Left 09/09/2022   Procedure: LEFT KNEE STEROID INJECTION;  Surgeon: Ollen Gross, MD;  Location: [facility];  Service: Orthopedics;   Laterality: Left;  left knee injection 0928   TONSILLECTOMY     TOTAL KNEE ARTHROPLASTY Right 09/09/2022   Procedure: TOTAL KNEE ARTHROPLASTY;  Surgeon: Ollen Gross, MD;  Location: [facility];  Service: Orthopedics;  Laterality: Right;   TUBAL LIGATION     Patient Active Problem List   Diagnosis Date Noted   OA (osteoarthritis) of knee 09/09/2022   Exertional dyspnea 04/17/2022   Elevated coronary artery calcium score 04/17/2022   Spondylolisthesis at L4-L5 level 12/27/2021   Ductal carcinoma in situ (DCIS) of left breast 01/19/2018   Generalized anxiety disorder 11/07/2017   Depression 11/07/2017   Insomnia 11/07/2017   Cervical spondylosis 03/19/2013    PCP: Creola Corn  REFERRING PROVIDER: Trudee Grip  REFERRING DIAG: R TKA 09/09/22  THERAPY DIAG:  S/P total knee arthroplasty, right  Acute pain of right knee  Stiffness of right knee, not elsewhere classified  Muscle weakness (generalized)  Other abnormalities of gait and mobility  Localized edema  Rationale for Evaluation and Treatment: Rehabilitation  ONSET DATE: 09/09/22  SUBJECTIVE:   SUBJECTIVE STATEMENT :  Patient reports a bad day, husband lost job, reports that she wsa sitting for 2 hours prior to this appointment R TKA 8/19 L knee steroid injection 09/09/22  PAIN:  Are you having pain? Yes: NPRS scale: 3/10 Pain location: R knee Pain description: sharp medial R knee  Aggravating factors: weight bearing on the RLE, bending, moving it  Relieving factors: ice helps some, pain meds  PRECAUTIONS: None  RED FLAGS: None   WEIGHT BEARING RESTRICTIONS: No  FALLS:  Has patient fallen in last 6 months? No  LIVING ENVIRONMENT: Lives with: lives with their family and lives with their spouse Lives in: House/apartment Stairs: Yes: External: 3 steps; none Has following equipment at home: Single point cane and Walker - 2 wheeled  OCCUPATION: Not working  PLOF: Independent  PATIENT GOALS: No pain, to  keep the knee moving  NEXT MD VISIT: 09/24/22  OBJECTIVE:   PATIENT SURVEYS:  FOTO 4  COGNITION: Overall cognitive status: Within functional limits for tasks assessed      EDEMA:  Increased swelling surrounding R knee  POSTURE: rounded shoulders and flexed trunk   PALPATION: Warm and TTP  LOWER EXTREMITY ROM:  Active ROM Right eval Left eval Right 09/24/22 Right 09/26/22 Right 10/02/22  11/01/22 12/09/22  Hip flexion          Hip extension          Hip abduction          Hip adduction          Hip internal rotation          Hip external rotation          Knee flexion  60  75* AROM, 86 PROM 82* AROM seated per pt request  78* self AAROM, 76* AROM  PROM 87 AROM 81 PROM 90 AROM 83 AROM 101 PROM 105  Knee extension -30  -15* AROM seated, -15 PROM supine   -16* supine heel prop with quad set  AROM 25 PROM 16 AROM 14 AROM 5 PROM 0  Ankle dorsiflexion          Ankle plantarflexion          Ankle inversion          Ankle eversion           (Blank rows = not tested)  LOWER EXTREMITY MMT:  MMT Right eval Left eval  Hip flexion 2   Hip extension    Hip abduction    Hip adduction    Hip internal rotation    Hip external rotation    Knee flexion 2   Knee extension 2   Ankle dorsiflexion    Ankle plantarflexion    Ankle inversion    Ankle eversion     (Blank rows = not tested)   FUNCTIONAL TESTS:  5 times sit to stand: unable to do  Timed up and go (TUG): 1 min 28s  GAIT: Distance walked: in clinic distances Assistive device utilized: Environmental consultant - 2 wheeled Level of assistance: SBA Comments: antalgic gait, slowed cadence, step to pattern, decreased step length and time on RLE    TODAY'S TREATMENT:  DATE:  03/06/23 Gait outside, stairs 6" step over step up and down with single hand rail On bosu # reaching On Bosu ball toss On bosu small  squats 50# resisted gait all directions Practice with her multiple ways to get down and off the floor, using a quad fold, bi fold and single mat trying to simulate floor and garden bath tub  03/03/23 On wobble board balance On bosu reaching for numbers on wall On airex reaching for numbers on wall 6" steps with stretch Tmill push Passive stretch of the right knee, hip flexor and quad STM to the quad, back and buttock  02/26/23 Stairs 6" step overstep with handrail and very light HHA 8" step up with the right, repeated with SPC and counter 8" stair down with SPC and counter for support and PT hand on knee to give some support, (stepping down with the good leg) Used quad fold mat on ground and the garden buddy to simulate bathtub sitting and standing this was bottom 13" off the ground, we then tried double fold and now was 8" off the ground.  We then tried to get down on the ground walking her hands out and then kneeling and rolling over, then worked on long sitting and then rolling over and getting gup by walking hands back Passive stretch  02/24/23 Stairs 2 full flights step over step 6" Rockerboard balance Kneeling on and getting up from quad fold mat Then kneeling weight shift left and right Kneeling on the left knee with right out in front and getting up Passive stretch to the right knee working on flexion STM with the Tgun working on the soreness and stiffness  02/20/23 On rockerboard 2 ways reaching for numbers on wall Up and down 6" stairs step over step, did step stretch Kneeling down on both knees on very thick pain with weight shift Kneel and get up Split squat right leg in front getting up from kneeling Passive stretch into flexion really stretching the quad, use of t-gun for quad tightness Able to get her easily to 115 degrees flexion  02/17/23 Bike level 4 x 6 minutes 6" stairs 2 flights up and down step over step 6" stair stretch for flexion With quad folded mat  kneeling on knees and then getting up from this position with hands on table We also worked on getting up from and staggered kneel, then while sitting on the mat tried to turn over and get up, required cues and some assist but did a lot of problem solving with her on this On bosu upside down reaching for numbers on wall, then on rocker board two ways reaching Leg press right only 20# varying degrees of flexion and her pushing to build strength  02/13/23 Practices kneeling again both knees on wedge pillow, then the left knee on and in a lunge position Went over to the stairs again and practiced up and down step over step with one hand rail and the [facility] Fitter 1 black band, then 1 black and 1 blue band knee extension SAQ 5# Leg press 20# right leg only did a lot of eccentrics to simulate down stairs, then once she was used to this did concentric On rocker board two ways On bosu upside down and flatside down reaching  02/10/23 Bike level 4 x 5 minutes Over to the old building to work on stairs, we did up and down step over step, I did have her pause when going down with the left leg  first to get more stretch on the right knee We did come back and practice 6 and 8" step down, very difficult for her We then worked on kneeling down as she has said she wants to be able to go to yoga again and needs to get up and down from the floor, worked on this various ways and how to get up from this position, we were kneeling on the quad fold mat  02/06/23 Nustep level 5 x 5 minutes Bike level 3 x 5 minutes Went over to the back building and worked on stairs real flights, can go up step over step with the handrail and SPC, can go down step over step but did struggle and needed tactile cues and some encouragement, had left knee pain with this and these are 6" steps, she had to stop after the first flight due to some right knee pain as well came back here and really practiced 4" and 6" steps with two handrails step  over step.  Tried various ways to work on Systems developer with the ROM on this SAQ and LAQ 5# with cues for TKE  PATIENT EDUCATION:  Education details: POC and HEP Person educated: Patient Education method: Explanation Education comprehension: verbalized understanding  HOME EXERCISE PROGRAM: Access Code: [id number]  ASSESSMENT:  CLINICAL IMPRESSION:  Patient really did well today with [place] on stairs but again these are ^" steps, hers at home are 8".  WE did a lot of practice on getting on and off the floor, we did struggle some but did a lot of problem solving and worked on this, we will meet at her house a week to see the steps and the bathtub OBJECTIVE IMPAIRMENTS: Abnormal gait, difficulty walking, decreased ROM, decreased strength, and pain.   ACTIVITY LIMITATIONS: bending, sitting, standing, squatting, sleeping, stairs, transfers, bathing, toileting, and locomotion level  PARTICIPATION LIMITATIONS: meal prep, cleaning, laundry, driving, shopping, and community activity  REHAB POTENTIAL: Good  CLINICAL DECISION MAKING: Stable/uncomplicated  EVALUATION COMPLEXITY: Low   GOALS: Goals reviewed with patient? Yes  SHORT TERM GOALS: Target date: 10/24/22  Patient will be independent with initial HEP. Goal status: 09/20/22 met  2.  Patient will be do TUG < 30s Baseline: 1 min 28s  Goal status: ongoing 10/07/22, 18.57s MET 10/09/22  3.  Patient will be able to do 5 sit to stands  Baseline: unable to do  Goal status: progressing 10/04/22, MET 10/09/22   LONG TERM GOALS: Target date: 01/31/23  Patient will be independent with advanced/ongoing HEP to improve outcomes and carryover.  Goal status: ongoing 11/06/22  2.  Patient will report at least 75% improvement in R knee pain to improve QOL. Baseline: 10/10 Goal status: met 02/03/23 60% better 11/20/22, 65% 12/06/22, 70% 12/17/22  3.  Patient will demonstrate improved R knee AROM to >/= 0-120 deg to allow for normal gait and stair  mechanics. Baseline: 30-0-60 10/09/22: 16-0-78  11/01/22: 14-0-83 11/20/22: 10-97 12/06/22: 10-0-100 12/17/22: 8-0-104 Goal status: continue to progress and getting closer 01/30/23  4.  Patient will demonstrate improved functional LE strength as demonstrated by 5/5. Baseline: 2/5 Goal status: MET 3+/5 10/09/22, 5/5 11/01/22   5.  Patient will be able to ambulate 500' with LRAD and normal gait pattern without increased pain to access community.  Baseline: using RW small in home ambulation distances Goal status: progressing w/SPC 12/02/22, doing some walking without AD   6.  Patient will report 30 on FOTO (patient reported outcome measure) to demonstrate improved  functional ability. Baseline: 4, 40-11/11/24 Goal status: MET 53 12/17/22  PLAN:  PT FREQUENCY: 2x/week  PT DURATION: 12 weeks  PLANNED INTERVENTIONS: Therapeutic exercises, Therapeutic activity, Neuromuscular re-education, Balance training, Gait training, Patient/Family education, Self Care, Joint mobilization, Stair training, Electrical stimulation, Cryotherapy, Moist heat, scar mobilization, Vasopneumatic device, and Manual therapy  PLAN FOR NEXT SESSION: progress Patient Details  Name: DAY GREB MRN: [id number] Date of Birth: 12-Sep-1960 Referring Provider:  Ollen Gross, MD  Encounter Date: 03/06/2023   Jearld Lesch, PT 03/06/2023, 4:13 PM

## 2023-03-10 ENCOUNTER — Ambulatory Visit: Payer: No Typology Code available for payment source | Admitting: Physical Therapy

## 2023-03-11 ENCOUNTER — Ambulatory Visit: Payer: No Typology Code available for payment source | Admitting: Physical Therapy

## 2023-03-11 DIAGNOSIS — Z96651 Presence of right artificial knee joint: Secondary | ICD-10-CM

## 2023-03-11 DIAGNOSIS — M6281 Muscle weakness (generalized): Secondary | ICD-10-CM

## 2023-03-11 DIAGNOSIS — M25561 Pain in right knee: Secondary | ICD-10-CM

## 2023-03-11 DIAGNOSIS — M25661 Stiffness of right knee, not elsewhere classified: Secondary | ICD-10-CM

## 2023-03-11 NOTE — Therapy (Signed)
OUTPATIENT PHYSICAL THERAPY LOWER EXTREMITY TREATMENT     Patient Name: [name] MRN: [id number] DOB:07-19-1960, 63 y.o., female Today's Date: 03/11/2023  END OF SESSION:  PT End of Session - 03/11/23 1023     Visit Number 63    PT Start Time 1022    PT Stop Time 1100    PT Time Calculation (min) 38 min             Past Medical History:  Diagnosis Date   Arthritis    Back pain of lumbar region with sciatica    Cancer (HCC)    breast   Depression    Diabetes mellitus, type II (HCC)    Family history of anesthesia complication    grandfather died under anesthesia 70's- had heart issues   H/O carpal tunnel repair 2014   Headache(784.0)    Hyperlipidemia    Hypertension    Hypothyroidism    Medial meniscus tear    PONV (postoperative nausea and vomiting)    Sleep apnea    cpap since 10   Past Surgical History:  Procedure Laterality Date   BILATERAL TOTAL MASTECTOMY WITH AXILLARY LYMPH NODE DISSECTION     BREAST RECONSTRUCTION     CERVICAL DISC ARTHROPLASTY N/A 03/19/2013   Procedure: CERVICAL FIVE TO SIX, CERVICAL SIX TO SEVEN CERVICAL ANTERIOR DISC ARTHROPLASTY;  Surgeon: Barnett Abu, MD;  Location: [facility];  Service: Neurosurgery;  Laterality: N/A;  C56 C67 artificial disc replacement   CESAREAN SECTION     COLONOSCOPY     DILATATION & CURETTAGE/HYSTEROSCOPY WITH TRUECLEAR N/A 11/25/2013   Procedure: DILATATION & CURETTAGE/HYSTEROSCOPY WITH TRUCLEAR;  Surgeon: Meriel Pica, MD;  Location: [facility];  Service: Gynecology;  Laterality: N/A;   ECTOPIC PREGNANCY SURGERY     GALLBLADDER SURGERY     MOUTH SURGERY     child- dog bite   SPINAL FUSION     2023   STERIOD INJECTION Left 09/09/2022   Procedure: LEFT KNEE STEROID INJECTION;  Surgeon: Ollen Gross, MD;  Location: [facility];  Service: Orthopedics;  Laterality: Left;  left knee injection 0928   TONSILLECTOMY     TOTAL KNEE ARTHROPLASTY Right 09/09/2022   Procedure: TOTAL KNEE ARTHROPLASTY;   Surgeon: Ollen Gross, MD;  Location: [facility];  Service: Orthopedics;  Laterality: Right;   TUBAL LIGATION     Patient Active Problem List   Diagnosis Date Noted   OA (osteoarthritis) of knee 09/09/2022   Exertional dyspnea 04/17/2022   Elevated coronary artery calcium score 04/17/2022   Spondylolisthesis at L4-L5 level 12/27/2021   Ductal carcinoma in situ (DCIS) of left breast 01/19/2018   Generalized anxiety disorder 11/07/2017   Depression 11/07/2017   Insomnia 11/07/2017   Cervical spondylosis 03/19/2013    PCP: Creola Corn  REFERRING PROVIDER: Trudee Grip  REFERRING DIAG: R TKA 09/09/22  THERAPY DIAG:  S/P total knee arthroplasty, right  Acute pain of right knee  Stiffness of right knee, not elsewhere classified  Muscle weakness (generalized)  Rationale for Evaluation and Treatment: Rehabilitation  ONSET DATE: 09/09/22  SUBJECTIVE:   SUBJECTIVE STATEMENT :  Sorry I am late, doing okay    Patient reports a bad day, husband lost job, reports that she wsa sitting for 2 hours prior to this appointment R TKA 8/19 L knee steroid injection 09/09/22  PAIN:  Are you having pain? Yes: NPRS scale: 3/10 Pain location: R knee Pain description: sharp medial R knee  Aggravating factors: weight bearing on the  RLE, bending, moving it  Relieving factors: ice helps some, pain meds  PRECAUTIONS: None  RED FLAGS: None   WEIGHT BEARING RESTRICTIONS: No  FALLS:  Has patient fallen in last 6 months? No  LIVING ENVIRONMENT: Lives with: lives with their family and lives with their spouse Lives in: House/apartment Stairs: Yes: External: 3 steps; none Has following equipment at home: Single point cane and Walker - 2 wheeled  OCCUPATION: Not working  PLOF: Independent  PATIENT GOALS: No pain, to keep the knee moving  NEXT MD VISIT: 09/24/22  OBJECTIVE:   PATIENT SURVEYS:  FOTO 4  COGNITION: Overall cognitive status: Within functional limits for tasks  assessed      EDEMA:  Increased swelling surrounding R knee  POSTURE: rounded shoulders and flexed trunk   PALPATION: Warm and TTP  LOWER EXTREMITY ROM:  Active ROM Right eval Left eval Right 09/24/22 Right 09/26/22 Right 10/02/22  11/01/22 12/09/22  Hip flexion          Hip extension          Hip abduction          Hip adduction          Hip internal rotation          Hip external rotation          Knee flexion  60  75* AROM, 86 PROM 82* AROM seated per pt request  78* self AAROM, 76* AROM  PROM 87 AROM 81 PROM 90 AROM 83 AROM 101 PROM 105  Knee extension -30  -15* AROM seated, -15 PROM supine   -16* supine heel prop with quad set  AROM 25 PROM 16 AROM 14 AROM 5 PROM 0  Ankle dorsiflexion          Ankle plantarflexion          Ankle inversion          Ankle eversion           (Blank rows = not tested)  LOWER EXTREMITY MMT:  MMT Right eval Left eval  Hip flexion 2   Hip extension    Hip abduction    Hip adduction    Hip internal rotation    Hip external rotation    Knee flexion 2   Knee extension 2   Ankle dorsiflexion    Ankle plantarflexion    Ankle inversion    Ankle eversion     (Blank rows = not tested)   FUNCTIONAL TESTS:  5 times sit to stand: unable to do  Timed up and go (TUG): 1 min 28s  GAIT: Distance walked: in clinic distances Assistive device utilized: Environmental consultant - 2 wheeled Level of assistance: SBA Comments: antalgic gait, slowed cadence, step to pattern, decreased step length and time on RLE    TODAY'S TREATMENT:  DATE:   03/11/23 Resisted gait 30# with 6 inch step up 5 x rt, 5x left, 5 times each side- light touch needed with RT LE 6 inch step down 2 sets 10 with UE STS 14 inch box 3 sets 5 Wide BOS STS from mat with LLE out and cued for full TKE on RT 2 sets 10 Leg Press unlocked for ROM 2 sets 10 Leg Press 30#  RT LE only 2 sets 10 AROM RT LE seated 0-112 Balance and ROM on BOS and rocker board      03/06/23 Gait outside, stairs 6" step over step up and down with single hand rail On bosu # reaching On Bosu ball toss On bosu small squats 50# resisted gait all directions Practice with her multiple ways to get down and off the floor, using a quad fold, bi fold and single mat trying to simulate floor and garden bath tub  03/03/23 On wobble board balance On bosu reaching for numbers on wall On airex reaching for numbers on wall 6" steps with stretch Tmill push Passive stretch of the right knee, hip flexor and quad STM to the quad, back and buttock  02/26/23 Stairs 6" step overstep with handrail and very light HHA 8" step up with the right, repeated with SPC and counter 8" stair down with SPC and counter for support and PT hand on knee to give some support, (stepping down with the good leg) Used quad fold mat on ground and the garden buddy to simulate bathtub sitting and standing this was bottom 13" off the ground, we then tried double fold and now was 8" off the ground.  We then tried to get down on the ground walking her hands out and then kneeling and rolling over, then worked on long sitting and then rolling over and getting gup by walking hands back Passive stretch  02/24/23 Stairs 2 full flights step over step 6" Rockerboard balance Kneeling on and getting up from quad fold mat Then kneeling weight shift left and right Kneeling on the left knee with right out in front and getting up Passive stretch to the right knee working on flexion STM with the Tgun working on the soreness and stiffness  02/20/23 On rockerboard 2 ways reaching for numbers on wall Up and down 6" stairs step over step, did step stretch Kneeling down on both knees on very thick pain with weight shift Kneel and get up Split squat right leg in front getting up from kneeling Passive stretch into flexion really  stretching the quad, use of t-gun for quad tightness Able to get her easily to 115 degrees flexion  02/17/23 Bike level 4 x 6 minutes 6" stairs 2 flights up and down step over step 6" stair stretch for flexion With quad folded mat kneeling on knees and then getting up from this position with hands on table We also worked on getting up from and staggered kneel, then while sitting on the mat tried to turn over and get up, required cues and some assist but did a lot of problem solving with her on this On bosu upside down reaching for numbers on wall, then on rocker board two ways reaching Leg press right only 20# varying degrees of flexion and her pushing to build strength  02/13/23 Practices kneeling again both knees on wedge pillow, then the left knee on and in a lunge position Went over to the stairs again and practiced up and down step over step with one  hand rail and the [facility] Fitter 1 black band, then 1 black and 1 blue band knee extension SAQ 5# Leg press 20# right leg only did a lot of eccentrics to simulate down stairs, then once she was used to this did concentric On rocker board two ways On bosu upside down and flatside down reaching  02/10/23 Bike level 4 x 5 minutes Over to the old building to work on stairs, we did up and down step over step, I did have her pause when going down with the left leg first to get more stretch on the right knee We did come back and practice 6 and 8" step down, very difficult for her We then worked on kneeling down as she has said she wants to be able to go to yoga again and needs to get up and down from the floor, worked on this various ways and how to get up from this position, we were kneeling on the quad fold mat  02/06/23 Nustep level 5 x 5 minutes Bike level 3 x 5 minutes Went over to the back building and worked on stairs real flights, can go up step over step with the handrail and SPC, can go down step over step but did struggle and needed  tactile cues and some encouragement, had left knee pain with this and these are 6" steps, she had to stop after the first flight due to some right knee pain as well came back here and really practiced 4" and 6" steps with two handrails step over step.  Tried various ways to work on Systems developer with the ROM on this SAQ and LAQ 5# with cues for TKE  PATIENT EDUCATION:  Education details: POC and HEP Person educated: Patient Education method: Explanation Education comprehension: verbalized understanding  HOME EXERCISE PROGRAM: Access Code: [id number]  ASSESSMENT:  CLINICAL IMPRESSION:  focus session on various quad strength for ease of  STS from low object including tub and steps. Pt did well with moderate encouragement and some assistance. OBJECTIVE IMPAIRMENTS: Abnormal gait, difficulty walking, decreased ROM, decreased strength, and pain.   ACTIVITY LIMITATIONS: bending, sitting, standing, squatting, sleeping, stairs, transfers, bathing, toileting, and locomotion level  PARTICIPATION LIMITATIONS: meal prep, cleaning, laundry, driving, shopping, and community activity  REHAB POTENTIAL: Good  CLINICAL DECISION MAKING: Stable/uncomplicated  EVALUATION COMPLEXITY: Low   GOALS: Goals reviewed with patient? Yes  SHORT TERM GOALS: Target date: 10/24/22  Patient will be independent with initial HEP. Goal status: 09/20/22 met  2.  Patient will be do TUG < 30s Baseline: 1 min 28s  Goal status: ongoing 10/07/22, 18.57s MET 10/09/22  3.  Patient will be able to do 5 sit to stands  Baseline: unable to do  Goal status: progressing 10/04/22, MET 10/09/22   LONG TERM GOALS: Target date: 01/31/23  Patient will be independent with advanced/ongoing HEP to improve outcomes and carryover.  Goal status: ongoing 11/06/22  2.  Patient will report at least 75% improvement in R knee pain to improve QOL. Baseline: 10/10 Goal status: met 02/03/23 60% better 11/20/22, 65% 12/06/22, 70% 12/17/22  3.   Patient will demonstrate improved R knee AROM to >/= 0-120 deg to allow for normal gait and stair mechanics. Baseline: 30-0-60 10/09/22: 16-0-78  11/01/22: 14-0-83 11/20/22: 10-97 12/06/22: 10-0-100 12/17/22: 8-0-104 Goal status: continue to progress and getting closer 01/30/23  4.  Patient will demonstrate improved functional LE strength as demonstrated by 5/5. Baseline: 2/5 Goal status: MET 3+/5 10/09/22, 5/5 11/01/22  5.  Patient will be able to ambulate 500' with LRAD and normal gait pattern without increased pain to access community.  Baseline: using RW small in home ambulation distances Goal status: progressing w/SPC 12/02/22, doing some walking without AD   6.  Patient will report 54 on FOTO (patient reported outcome measure) to demonstrate improved functional ability. Baseline: 4, 40-11/11/24 Goal status: MET 53 12/17/22  PLAN:  PT FREQUENCY: 2x/week  PT DURATION: 12 weeks  PLANNED INTERVENTIONS: Therapeutic exercises, Therapeutic activity, Neuromuscular re-education, Balance training, Gait training, Patient/Family education, Self Care, Joint mobilization, Stair training, Electrical stimulation, Cryotherapy, Moist heat, scar mobilization, Vasopneumatic device, and Manual therapy  PLAN FOR NEXT SESSION: progress Patient Details  Name: SVETLANA BAGBY MRN: [id number] Date of Birth: 1960-12-18 Referring Provider:  Creola Corn, MD  Encounter Date: 03/11/2023   Suanne Marker, PTA 03/11/2023, 10:24 AM [facility] [address] [place], [place], [address] Phone: [phone]   Fax:  [phone]  Patient Details  Name: LYRIC ROSSANO MRN: [id number] Date of Birth: 1961/01/04 Referring Provider:  Creola Corn, MD  Encounter Date: 03/11/2023   Suanne Marker, PTA 03/11/2023, 10:24 AM  [facility] [address] [facility] [address] [place], [place], [address] Phone:  [phone]   Fax:  [phone]

## 2023-03-13 ENCOUNTER — Ambulatory Visit: Payer: No Typology Code available for payment source | Admitting: Physical Therapy

## 2023-03-13 ENCOUNTER — Encounter: Payer: Self-pay | Admitting: Physical Therapy

## 2023-03-13 DIAGNOSIS — M25561 Pain in right knee: Secondary | ICD-10-CM

## 2023-03-13 DIAGNOSIS — Z96651 Presence of right artificial knee joint: Secondary | ICD-10-CM | POA: Diagnosis not present

## 2023-03-13 DIAGNOSIS — M6281 Muscle weakness (generalized): Secondary | ICD-10-CM

## 2023-03-13 DIAGNOSIS — R2689 Other abnormalities of gait and mobility: Secondary | ICD-10-CM

## 2023-03-13 DIAGNOSIS — M25661 Stiffness of right knee, not elsewhere classified: Secondary | ICD-10-CM

## 2023-03-13 NOTE — Therapy (Signed)
OUTPATIENT PHYSICAL THERAPY LOWER EXTREMITY TREATMENT     Patient Name: [name] MRN: [id number] DOB:[birth year], 63 y.o., female Today's Date: 03/13/2023  END OF SESSION:  PT End of Session - 03/13/23 1149     Visit Number 64    Date for PT Re-Evaluation 04/10/23    Authorization Type [employer]    PT Start Time 1215    PT Stop Time 1310    PT Time Calculation (min) 55 min    Activity Tolerance Patient tolerated treatment well    Behavior During Therapy WFL for tasks assessed/performed             Past Medical History:  Diagnosis Date   Arthritis    Back pain of lumbar region with sciatica    Cancer (HCC)    breast   Depression    Diabetes mellitus, type II (HCC)    Family history of anesthesia complication    grandfather died under anesthesia 70's- had heart issues   H/O carpal tunnel repair 2014   Headache(784.0)    Hyperlipidemia    Hypertension    Hypothyroidism    Medial meniscus tear    PONV (postoperative nausea and vomiting)    Sleep apnea    cpap since 10   Past Surgical History:  Procedure Laterality Date   BILATERAL TOTAL MASTECTOMY WITH AXILLARY LYMPH NODE DISSECTION     BREAST RECONSTRUCTION     CERVICAL DISC ARTHROPLASTY N/A 03/19/2013   Procedure: CERVICAL FIVE TO SIX, CERVICAL SIX TO SEVEN CERVICAL ANTERIOR DISC ARTHROPLASTY;  Surgeon: Barnett Abu, MD;  Location: [facility];  Service: Neurosurgery;  Laterality: N/A;  C56 C67 artificial disc replacement   CESAREAN SECTION     COLONOSCOPY     DILATATION & CURETTAGE/HYSTEROSCOPY WITH TRUECLEAR N/A 11/25/2013   Procedure: DILATATION & CURETTAGE/HYSTEROSCOPY WITH TRUCLEAR;  Surgeon: Meriel Pica, MD;  Location: [facility];  Service: Gynecology;  Laterality: N/A;   ECTOPIC PREGNANCY SURGERY     GALLBLADDER SURGERY     MOUTH SURGERY     child- dog bite   SPINAL FUSION     2023   STERIOD INJECTION Left 09/09/2022   Procedure: LEFT KNEE STEROID INJECTION;  Surgeon: Ollen Gross, MD;   Location: [facility];  Service: Orthopedics;  Laterality: Left;  left knee injection 0928   TONSILLECTOMY     TOTAL KNEE ARTHROPLASTY Right 09/09/2022   Procedure: TOTAL KNEE ARTHROPLASTY;  Surgeon: Ollen Gross, MD;  Location: [facility];  Service: Orthopedics;  Laterality: Right;   TUBAL LIGATION     Patient Active Problem List   Diagnosis Date Noted   OA (osteoarthritis) of knee 09/09/2022   Exertional dyspnea 04/17/2022   Elevated coronary artery calcium score 04/17/2022   Spondylolisthesis at L4-L5 level 12/27/2021   Ductal carcinoma in situ (DCIS) of left breast 01/19/2018   Generalized anxiety disorder 11/07/2017   Depression 11/07/2017   Insomnia 11/07/2017   Cervical spondylosis 03/19/2013    PCP: Creola Corn  REFERRING PROVIDER: Trudee Grip  REFERRING DIAG: R TKA 09/09/22  THERAPY DIAG:  S/P total knee arthroplasty, right  Acute pain of right knee  Stiffness of right knee, not elsewhere classified  Muscle weakness (generalized)  Other abnormalities of gait and mobility  Rationale for Evaluation and Treatment: Rehabilitation  ONSET DATE: 09/09/22  SUBJECTIVE:   SUBJECTIVE STATEMENT :  I met patient at her house today to look at the stairs and the bathtub situation. And try to problem solve this with her.  Patient reports a bad day, husband lost job, reports that she wsa sitting for 2 hours prior to this appointment R TKA 8/19 L knee steroid injection 09/09/22  PAIN:  Are you having pain? Yes: NPRS scale: 3/10 Pain location: R knee Pain description: sharp medial R knee  Aggravating factors: weight bearing on the RLE, bending, moving it  Relieving factors: ice helps some, pain meds  PRECAUTIONS: None  RED FLAGS: None   WEIGHT BEARING RESTRICTIONS: No  FALLS:  Has patient fallen in last 6 months? No  LIVING ENVIRONMENT: Lives with: lives with their family and lives with their spouse Lives in: House/apartment Stairs: Yes: External: 3 steps; none Has  following equipment at home: Single point cane and Walker - 2 wheeled  OCCUPATION: Not working  PLOF: Independent  PATIENT GOALS: No pain, to keep the knee moving  NEXT MD VISIT: 09/24/22  OBJECTIVE:   PATIENT SURVEYS:  FOTO 4  COGNITION: Overall cognitive status: Within functional limits for tasks assessed      EDEMA:  Increased swelling surrounding R knee  POSTURE: rounded shoulders and flexed trunk   PALPATION: Warm and TTP  LOWER EXTREMITY ROM:  Active ROM Right eval Left eval Right 09/24/22 Right 09/26/22 Right 10/02/22  11/01/22 12/09/22  Hip flexion          Hip extension          Hip abduction          Hip adduction          Hip internal rotation          Hip external rotation          Knee flexion  60  75* AROM, 86 PROM 82* AROM seated per pt request  78* self AAROM, 76* AROM  PROM 87 AROM 81 PROM 90 AROM 83 AROM 101 PROM 105  Knee extension -30  -15* AROM seated, -15 PROM supine   -16* supine heel prop with quad set  AROM 25 PROM 16 AROM 14 AROM 5 PROM 0  Ankle dorsiflexion          Ankle plantarflexion          Ankle inversion          Ankle eversion           (Blank rows = not tested)  LOWER EXTREMITY MMT:  MMT Right eval Left eval  Hip flexion 2   Hip extension    Hip abduction    Hip adduction    Hip internal rotation    Hip external rotation    Knee flexion 2   Knee extension 2   Ankle dorsiflexion    Ankle plantarflexion    Ankle inversion    Ankle eversion     (Blank rows = not tested)   FUNCTIONAL TESTS:  5 times sit to stand: unable to do  Timed up and go (TUG): 1 min 28s  GAIT: Distance walked: in clinic distances Assistive device utilized: Environmental consultant - 2 wheeled Level of assistance: SBA Comments: antalgic gait, slowed cadence, step to pattern, decreased step length and time on RLE    TODAY'S TREATMENT:  DATE:  03/13/23 Home visit: Stairs: in side are 7", has a banister on the left going up part way and then on right after a turn, the bottom part of the banister is not completely sturdy Entrances:  front door 8" step no railing,  Back door steps are 8" at top due to threshold, but other stairs are 4"-7" with a railing on the left going up, we problem solved her turning toward the door jamb at the top of the stairs and using the door jamb to pull herself into the home, intead of the big step up and over. Bedroom entrance from outside, has railing on the right going up, 6"  Bathtub:  23" clearance, we worked on getting in and out using a couple of exercise mats as cushions, our best option was to sit on the edge of the tub, turn and bring feet in and slide or duck walk down into the tub.  Then to get out bend left knee up under her. roll to her left onto her knee and hand walk up and then step out Shower entrance is 8", would need to use walls to help get in and out of the shower   03/11/23 Resisted gait 30# with 6 inch step up 5 x rt, 5x left, 5 times each side- light touch needed with RT LE 6 inch step down 2 sets 10 with UE STS 14 inch box 3 sets 5 Wide BOS STS from mat with LLE out and cued for full TKE on RT 2 sets 10 Leg Press unlocked for ROM 2 sets 10 Leg Press 30# RT LE only 2 sets 10 AROM RT LE seated 0-112 Balance and ROM on BOS and rocker board  03/06/23 Gait outside, stairs 6" step over step up and down with single hand rail On bosu # reaching On Bosu ball toss On bosu small squats 50# resisted gait all directions Practice with her multiple ways to get down and off the floor, using a quad fold, bi fold and single mat trying to simulate floor and garden bath tub  03/03/23 On wobble board balance On bosu reaching for numbers on wall On airex reaching for numbers on wall 6" steps with stretch Tmill push Passive stretch of the right knee, hip flexor and quad STM to the quad,  back and buttock  02/26/23 Stairs 6" step overstep with handrail and very light HHA 8" step up with the right, repeated with SPC and counter 8" stair down with SPC and counter for support and PT hand on knee to give some support, (stepping down with the good leg) Used quad fold mat on ground and the garden buddy to simulate bathtub sitting and standing this was bottom 13" off the ground, we then tried double fold and now was 8" off the ground.  We then tried to get down on the ground walking her hands out and then kneeling and rolling over, then worked on long sitting and then rolling over and getting gup by walking hands back Passive stretch  02/24/23 Stairs 2 full flights step over step 6" Rockerboard balance Kneeling on and getting up from quad fold mat Then kneeling weight shift left and right Kneeling on the left knee with right out in front and getting up Passive stretch to the right knee working on flexion STM with the Tgun working on the soreness and stiffness  02/20/23 On rockerboard 2 ways reaching for numbers on wall Up and down 6" stairs step over step,  did step stretch Kneeling down on both knees on very thick pain with weight shift Kneel and get up Split squat right leg in front getting up from kneeling Passive stretch into flexion really stretching the quad, use of t-gun for quad tightness Able to get her easily to 115 degrees flexion  02/17/23 Bike level 4 x 6 minutes 6" stairs 2 flights up and down step over step 6" stair stretch for flexion With quad folded mat kneeling on knees and then getting up from this position with hands on table We also worked on getting up from and staggered kneel, then while sitting on the mat tried to turn over and get up, required cues and some assist but did a lot of problem solving with her on this On bosu upside down reaching for numbers on wall, then on rocker board two ways reaching Leg press right only 20# varying degrees of flexion and  her pushing to build strength  02/13/23 Practices kneeling again both knees on wedge pillow, then the left knee on and in a lunge position Went over to the stairs again and practiced up and down step over step with one hand rail and the SPC Fitter 1 black band, then 1 black and 1 blue band knee extension SAQ 5# Leg press 20# right leg only did a lot of eccentrics to simulate down stairs, then once she was used to this did concentric On rocker board two ways On bosu upside down and flatside down reaching  PATIENT EDUCATION:  Education details: POC and HEP Person educated: Patient Education method: Explanation Education comprehension: verbalized understanding  HOME EXERCISE PROGRAM: Access Code: [email or web address]  ASSESSMENT:  CLINICAL IMPRESSION:   I had set up a home visit with Shanikqua about a month ago as she had a lot of questions and concerns about her entry into her home, the stairs in the home and then also wanting to be able to get into her bath tub.  Problems noted and worked on solving them as noted above.  She is making progress, she has a lot of anxiety about trying some things that she feels she should be able to do but has not attempted like the bathtub.  Our focus lately has been the function in the home, up and down form the ground.  OBJECTIVE IMPAIRMENTS: Abnormal gait, difficulty walking, decreased ROM, decreased strength, and pain.   ACTIVITY LIMITATIONS: bending, sitting, standing, squatting, sleeping, stairs, transfers, bathing, toileting, and locomotion level  PARTICIPATION LIMITATIONS: meal prep, cleaning, laundry, driving, shopping, and community activity  REHAB POTENTIAL: Good  CLINICAL DECISION MAKING: Stable/uncomplicated  EVALUATION COMPLEXITY: Low   GOALS: Goals reviewed with patient? Yes  SHORT TERM GOALS: Target date: 10/24/22  Patient will be independent with initial HEP. Goal status: 09/20/22 met  2.  Patient will be do TUG < 30s Baseline: 1 min 28s   Goal status: ongoing 10/07/22, 18.57s MET 10/09/22  3.  Patient will be able to do 5 sit to stands  Baseline: unable to do  Goal status: progressing 10/04/22, MET 10/09/22   LONG TERM GOALS: Target date: 01/31/23  Patient will be independent with advanced/ongoing HEP to improve outcomes and carryover.  Goal status: ongoing 11/06/22  2.  Patient will report at least 75% improvement in R knee pain to improve QOL. Baseline: 10/10 Goal status: met 02/03/23 60% better 11/20/22, 65% 12/06/22, 70% 12/17/22  3.  Patient will demonstrate improved R knee AROM to >/= 0-120 deg to allow for normal gait and  stair mechanics. Baseline: 30-0-60 10/09/22: 16-0-78  11/01/22: 14-0-83 11/20/22: 10-97 12/06/22: 10-0-100 12/17/22: 8-0-104 Goal status: continue to progress and getting closer 0-112  03/13/23  4.  Patient will demonstrate improved functional LE strength as demonstrated by 5/5. Baseline: 2/5 Goal status: MET 3+/5 10/09/22, 5/5 11/01/22   5.  Patient will be able to ambulate 500' with LRAD and normal gait pattern without increased pain to access community.  Baseline: using RW small in home ambulation distances Goal status: progressing w/SPC 12/02/22, doing some walking without AD   6.  Patient will report 43 on FOTO (patient reported outcome measure) to demonstrate improved functional ability. Baseline: 4, 40-11/11/24 Goal status: MET 53 12/17/22  PLAN:  PT FREQUENCY: 2x/week  PT DURATION: 12 weeks  PLANNED INTERVENTIONS: Therapeutic exercises, Therapeutic activity, Neuromuscular re-education, Balance training, Gait training, Patient/Family education, Self Care, Joint mobilization, Stair training, Electrical stimulation, Cryotherapy, Moist heat, scar mobilization, Vasopneumatic device, and Manual therapy  PLAN FOR NEXT SESSION: will work on some of the issues we found today at the home issues with tub and getting up and down from the floor or the stairs of varying height Patient Details   Name: NYSIA DELL MRN: [id number] Date of Birth: Jun 27, 1960 Referring Provider:  Ollen Gross, MD  Encounter Date: 03/13/2023

## 2023-03-17 ENCOUNTER — Ambulatory Visit: Payer: No Typology Code available for payment source | Admitting: Physical Therapy

## 2023-03-17 ENCOUNTER — Encounter: Payer: Self-pay | Admitting: Physical Therapy

## 2023-03-17 DIAGNOSIS — M6281 Muscle weakness (generalized): Secondary | ICD-10-CM

## 2023-03-17 DIAGNOSIS — R600 Localized edema: Secondary | ICD-10-CM

## 2023-03-17 DIAGNOSIS — Z96651 Presence of right artificial knee joint: Secondary | ICD-10-CM

## 2023-03-17 DIAGNOSIS — M25561 Pain in right knee: Secondary | ICD-10-CM

## 2023-03-17 DIAGNOSIS — M25661 Stiffness of right knee, not elsewhere classified: Secondary | ICD-10-CM

## 2023-03-17 DIAGNOSIS — R2689 Other abnormalities of gait and mobility: Secondary | ICD-10-CM

## 2023-03-17 NOTE — Therapy (Signed)
 OUTPATIENT PHYSICAL THERAPY LOWER EXTREMITY TREATMENT     Patient Name: [name] MRN: [id number] DOB:January 16, 1961, 63 y.o., female Today's Date: 03/17/2023  END OF SESSION:  PT End of Session - 03/17/23 1527     Visit Number 65    Date for PT Re-Evaluation 04/10/23    Authorization Type [employer]    PT Start Time 1527    PT Stop Time 1611    PT Time Calculation (min) 44 min    Activity Tolerance Patient tolerated treatment well    Behavior During Therapy WFL for tasks assessed/performed             Past Medical History:  Diagnosis Date   Arthritis    Back pain of lumbar region with sciatica    Cancer (HCC)    breast   Depression    Diabetes mellitus, type II (HCC)    Family history of anesthesia complication    grandfather died under anesthesia 70's- had heart issues   H/O carpal tunnel repair 2014   Headache(784.0)    Hyperlipidemia    Hypertension    Hypothyroidism    Medial meniscus tear    PONV (postoperative nausea and vomiting)    Sleep apnea    cpap since 10   Past Surgical History:  Procedure Laterality Date   BILATERAL TOTAL MASTECTOMY WITH AXILLARY LYMPH NODE DISSECTION     BREAST RECONSTRUCTION     CERVICAL DISC ARTHROPLASTY N/A 03/19/2013   Procedure: CERVICAL FIVE TO SIX, CERVICAL SIX TO SEVEN CERVICAL ANTERIOR DISC ARTHROPLASTY;  Surgeon: Barnett Abu, MD;  Location: [facility];  Service: Neurosurgery;  Laterality: N/A;  C56 C67 artificial disc replacement   CESAREAN SECTION     COLONOSCOPY     DILATATION & CURETTAGE/HYSTEROSCOPY WITH TRUECLEAR N/A 11/25/2013   Procedure: DILATATION & CURETTAGE/HYSTEROSCOPY WITH TRUCLEAR;  Surgeon: Meriel Pica, MD;  Location: [facility];  Service: Gynecology;  Laterality: N/A;   ECTOPIC PREGNANCY SURGERY     GALLBLADDER SURGERY     MOUTH SURGERY     child- dog bite   SPINAL FUSION     2023   STERIOD INJECTION Left 09/09/2022   Procedure: LEFT KNEE STEROID INJECTION;  Surgeon: Ollen Gross, MD;   Location: [facility];  Service: Orthopedics;  Laterality: Left;  left knee injection 0928   TONSILLECTOMY     TOTAL KNEE ARTHROPLASTY Right 09/09/2022   Procedure: TOTAL KNEE ARTHROPLASTY;  Surgeon: Ollen Gross, MD;  Location: [facility];  Service: Orthopedics;  Laterality: Right;   TUBAL LIGATION     Patient Active Problem List   Diagnosis Date Noted   OA (osteoarthritis) of knee 09/09/2022   Exertional dyspnea 04/17/2022   Elevated coronary artery calcium score 04/17/2022   Spondylolisthesis at L4-L5 level 12/27/2021   Ductal carcinoma in situ (DCIS) of left breast 01/19/2018   Generalized anxiety disorder 11/07/2017   Depression 11/07/2017   Insomnia 11/07/2017   Cervical spondylosis 03/19/2013    PCP: Creola Corn  REFERRING PROVIDER: Trudee Grip  REFERRING DIAG: R TKA 09/09/22  THERAPY DIAG:  S/P total knee arthroplasty, right  Acute pain of right knee  Stiffness of right knee, not elsewhere classified  Muscle weakness (generalized)  Other abnormalities of gait and mobility  Localized edema  Rationale for Evaluation and Treatment: Rehabilitation  ONSET DATE: 09/09/22  SUBJECTIVE:   SUBJECTIVE STATEMENT :  Patient reports that she has a little more right low back pain, has stress in her life and has been sleeping on  a different bed and mattress.  Patient reports a bad day, husband lost job, reports that she wsa sitting for 2 hours prior to this appointment R TKA 8/19 L knee steroid injection 09/09/22  PAIN:  Are you having pain? Yes: NPRS scale: 5/10 Pain location: R knee Pain description: sharp medial R knee  Aggravating factors: weight bearing on the RLE, bending, moving it  Relieving factors: ice helps some, pain meds  PRECAUTIONS: None  RED FLAGS: None   WEIGHT BEARING RESTRICTIONS: No  FALLS:  Has patient fallen in last 6 months? No  LIVING ENVIRONMENT: Lives with: lives with their family and lives with their spouse Lives in:  House/apartment Stairs: Yes: External: 3 steps; none Has following equipment at home: Single point cane and Walker - 2 wheeled  OCCUPATION: Not working  PLOF: Independent  PATIENT GOALS: No pain, to keep the knee moving  NEXT MD VISIT: 09/24/22  OBJECTIVE:   PATIENT SURVEYS:  FOTO 4  COGNITION: Overall cognitive status: Within functional limits for tasks assessed      EDEMA:  Increased swelling surrounding R knee  POSTURE: rounded shoulders and flexed trunk   PALPATION: Warm and TTP  LOWER EXTREMITY ROM:  Active ROM Right eval Left eval Right 09/24/22 Right 09/26/22 Right 10/02/22  11/01/22 12/09/22  Hip flexion          Hip extension          Hip abduction          Hip adduction          Hip internal rotation          Hip external rotation          Knee flexion  60  75* AROM, 86 PROM 82* AROM seated per pt request  78* self AAROM, 76* AROM  PROM 87 AROM 81 PROM 90 AROM 83 AROM 101 PROM 105  Knee extension -30  -15* AROM seated, -15 PROM supine   -16* supine heel prop with quad set  AROM 25 PROM 16 AROM 14 AROM 5 PROM 0  Ankle dorsiflexion          Ankle plantarflexion          Ankle inversion          Ankle eversion           (Blank rows = not tested)  LOWER EXTREMITY MMT:  MMT Right eval Left eval  Hip flexion 2   Hip extension    Hip abduction    Hip adduction    Hip internal rotation    Hip external rotation    Knee flexion 2   Knee extension 2   Ankle dorsiflexion    Ankle plantarflexion    Ankle inversion    Ankle eversion     (Blank rows = not tested)   FUNCTIONAL TESTS:  5 times sit to stand: unable to do  Timed up and go (TUG): 1 min 28s  GAIT: Distance walked: in clinic distances Assistive device utilized: Environmental consultant - 2 wheeled Level of assistance: SBA Comments: antalgic gait, slowed cadence, step to pattern, decreased step length and time on RLE    TODAY'S TREATMENT:  DATE:  03/17/23 Gait outside negotiated curbs, back building stairs step over step, outside walking and then in grass, slope, uneven terrain On upside down bosu balance and reaching 40# resisted gait fwd and backward, some back pain with this Passive HS and piriformis stretches Green tband clamshells Feet on ball K2C, trunk rotation, small posterior activation, isometric abs Used Tgun and focused on the right low back and the right buttock for pain and spasms  03/13/23 Home visit: Stairs: in side are 7", has a banister on the left going up part way and then on right after a turn, the bottom part of the banister is not completely sturdy Entrances:  front door 8" step no railing,  Back door steps are 8" at top due to threshold, but other stairs are 4"-7" with a railing on the left going up, we problem solved her turning toward the door jamb at the top of the stairs and using the door jamb to pull herself into the home, intead of the big step up and over. Bedroom entrance from outside, has railing on the right going up, 6"  Bathtub:  23" clearance, we worked on getting in and out using a couple of exercise mats as cushions, our best option was to sit on the edge of the tub, turn and bring feet in and slide or duck walk down into the tub.  Then to get out bend left knee up under her. roll to her left onto her knee and hand walk up and then step out Shower entrance is 8", would need to use walls to help get in and out of the shower   03/11/23 Resisted gait 30# with 6 inch step up 5 x rt, 5x left, 5 times each side- light touch needed with RT LE 6 inch step down 2 sets 10 with UE STS 14 inch box 3 sets 5 Wide BOS STS from mat with LLE out and cued for full TKE on RT 2 sets 10 Leg Press unlocked for ROM 2 sets 10 Leg Press 30# RT LE only 2 sets 10 AROM RT LE seated 0-112 Balance and ROM on BOS and rocker board  03/06/23 Gait outside,  stairs 6" step over step up and down with single hand rail On bosu # reaching On Bosu ball toss On bosu small squats 50# resisted gait all directions Practice with her multiple ways to get down and off the floor, using a quad fold, bi fold and single mat trying to simulate floor and garden bath tub  03/03/23 On wobble board balance On bosu reaching for numbers on wall On airex reaching for numbers on wall 6" steps with stretch Tmill push Passive stretch of the right knee, hip flexor and quad STM to the quad, back and buttock  02/26/23 Stairs 6" step overstep with handrail and very light HHA 8" step up with the right, repeated with SPC and counter 8" stair down with SPC and counter for support and PT hand on knee to give some support, (stepping down with the good leg) Used quad fold mat on ground and the garden buddy to simulate bathtub sitting and standing this was bottom 13" off the ground, we then tried double fold and now was 8" off the ground.  We then tried to get down on the ground walking her hands out and then kneeling and rolling over, then worked on long sitting and then rolling over and getting gup by walking hands back Passive stretch  02/24/23 Stairs 2  full flights step over step 6" Rockerboard balance Kneeling on and getting up from quad fold mat Then kneeling weight shift left and right Kneeling on the left knee with right out in front and getting up Passive stretch to the right knee working on flexion STM with the Tgun working on the soreness and stiffness  PATIENT EDUCATION:  Education details: POC and HEP Person educated: Patient Education method: Explanation Education comprehension: verbalized understanding  HOME EXERCISE PROGRAM: Access Code: [id number]  ASSESSMENT:  CLINICAL IMPRESSION:   Resumed out work on functional gait and knowing her stairs are 7" and not 8" should help her be more confident, walked in the grass and on uneven terrain, curbs etc...  She  is having a little more right low back pain, she is tight and sore/tender she does report she has been sleeping in a different bed the past 4 days  OBJECTIVE IMPAIRMENTS: Abnormal gait, difficulty walking, decreased ROM, decreased strength, and pain.   ACTIVITY LIMITATIONS: bending, sitting, standing, squatting, sleeping, stairs, transfers, bathing, toileting, and locomotion level  PARTICIPATION LIMITATIONS: meal prep, cleaning, laundry, driving, shopping, and community activity  REHAB POTENTIAL: Good  CLINICAL DECISION MAKING: Stable/uncomplicated  EVALUATION COMPLEXITY: Low   GOALS: Goals reviewed with patient? Yes  SHORT TERM GOALS: Target date: 10/24/22  Patient will be independent with initial HEP. Goal status: 09/20/22 met  2.  Patient will be do TUG < 30s Baseline: 1 min 28s  Goal status: ongoing 10/07/22, 18.57s MET 10/09/22  3.  Patient will be able to do 5 sit to stands  Baseline: unable to do  Goal status: progressing 10/04/22, MET 10/09/22   LONG TERM GOALS: Target date: 01/31/23  Patient will be independent with advanced/ongoing HEP to improve outcomes and carryover.  Goal status: ongoing 11/06/22  2.  Patient will report at least 75% improvement in R knee pain to improve QOL. Baseline: 10/10 Goal status: met 02/03/23 60% better 11/20/22, 65% 12/06/22, 70% 12/17/22  3.  Patient will demonstrate improved R knee AROM to >/= 0-120 deg to allow for normal gait and stair mechanics. Baseline: 30-0-60 10/09/22: 16-0-78  11/01/22: 14-0-83 11/20/22: 10-97 12/06/22: 10-0-100 12/17/22: 8-0-104 Goal status: continue to progress and getting closer 0-112  03/13/23  4.  Patient will demonstrate improved functional LE strength as demonstrated by 5/5. Baseline: 2/5 Goal status: MET 3+/5 10/09/22, 5/5 11/01/22   5.  Patient will be able to ambulate 500' with LRAD and normal gait pattern without increased pain to access community.  Baseline: using RW small in home ambulation  distances Goal status: progressing w/SPC 12/02/22, doing some walking without AD   6.  Patient will report 41 on FOTO (patient reported outcome measure) to demonstrate improved functional ability. Baseline: 4, 40-11/11/24 Goal status: MET 53 12/17/22  PLAN:  PT FREQUENCY: 2x/week  PT DURATION: 12 weeks  PLANNED INTERVENTIONS: Therapeutic exercises, Therapeutic activity, Neuromuscular re-education, Balance training, Gait training, Patient/Family education, Self Care, Joint mobilization, Stair training, Electrical stimulation, Cryotherapy, Moist heat, scar mobilization, Vasopneumatic device, and Manual therapy  PLAN FOR NEXT SESSION: will work on some of the issues we found today at the home issues with tub and getting up and down from the floor or the stairs of varying height Patient Details  Name: [name] MRN: [id number] Date of Birth: 1960-05-29 Referring Provider:  Ollen Gross, MD  Encounter Date: 03/17/2023

## 2023-03-18 ENCOUNTER — Encounter: Payer: Self-pay | Admitting: Professional Counselor

## 2023-03-18 ENCOUNTER — Ambulatory Visit: Payer: No Typology Code available for payment source | Admitting: Professional Counselor

## 2023-03-18 DIAGNOSIS — F331 Major depressive disorder, recurrent, moderate: Secondary | ICD-10-CM

## 2023-03-18 DIAGNOSIS — F411 Generalized anxiety disorder: Secondary | ICD-10-CM

## 2023-03-18 NOTE — Progress Notes (Signed)
      [facility] Counselor/Therapist Progress Note  Patient ID: [name], MRN: [id number],    Date: 04/21/2023  Time Spent: 4:11 PM - 3:14 PM   Treatment Type: Family with patient  Reported Symptoms: worries, fears, restlessness, sadness, grief/loss, hypervigilance, physiological stress, interpersonal concerns, anhedonia, fatigue, low mood  Mental Status Exam:  Appearance:   Neat     Behavior:  Appropriate, Sharing, and Motivated  Motor:  Normal  Speech/Language:   Clear and Coherent and Normal Rate  Affect:  Agitated  Mood:  anxious  Thought process:  normal  Thought content:    WNL  Sensory/Perceptual disturbances:    WNL  Orientation:  oriented to person, place, time/date, and situation  Attention:  Good  Concentration:  Good  Memory:  WNL  Fund of knowledge:   Good  Insight:    Good  Judgment:   Good  Impulse Control:  Good   Risk Assessment: Danger to Self:  Yes.  without intent/plan vague SI Self-injurious Behavior: No Danger to Others: No Duty to Warn:no Physical Aggression / Violence:No  Access to Firearms a concern: No  Gang Involvement:No   Subjective: Patient presented to session to address concerns of anxiety and reported symptoms of depression a exacerbated at this time. She voiced no progress and in fact feeling setback. She presented to session with her husband.  She and husband reported that he had encountered forced reduction of time at work impacting pay and for this to be a very stressful circumstance for them. They processed experience of a related conflict that became heightened and led patient to feel unsafe and to leave home to go to daughter's. Counselor helped couple reflect on triggers and cyclical pattern of conflict, and emotional substance underneath their surface concerns. Counselor provided psychoeducation regarding toxic stress and couple dicussed how their cycle and the resulting measures were not sustainable to their health or the  health of the relationship. Counselor encouraged continued resourcing of emotional regulation and other coping strategies.  Interventions: Solution-Oriented/Positive Psychology, Humanistic/Existential, Insight-Oriented, and Family Systems, Psychoeducation  Diagnosis:   ICD-10-CM   1. Generalized anxiety disorder  F41.1     2. Major depressive disorder, recurrent episode, moderate (HCC)  F33.1       Plan: Patient is scheduled for a follow-up; continue process work and developing coping skills. Patient STG between sessions to resource emotional regulation coping skills and prioritize safety and self care.   Gaspar Bidding, [facility]

## 2023-03-20 ENCOUNTER — Encounter: Payer: Self-pay | Admitting: Physical Therapy

## 2023-03-20 ENCOUNTER — Ambulatory Visit: Payer: No Typology Code available for payment source | Admitting: Physical Therapy

## 2023-03-20 DIAGNOSIS — Z96651 Presence of right artificial knee joint: Secondary | ICD-10-CM | POA: Diagnosis not present

## 2023-03-20 DIAGNOSIS — G8929 Other chronic pain: Secondary | ICD-10-CM

## 2023-03-20 NOTE — Therapy (Signed)
 OUTPATIENT PHYSICAL THERAPY LOWER EXTREMITY TREATMENT     Patient Name: [name] MRN: [id number] DOB:Jun 06, 1960, 63 y.o., female Today's Date: 03/20/2023  END OF SESSION:  PT End of Session - 03/20/23 1530     Visit Number 66    Date for PT Re-Evaluation 04/10/23    Authorization Type [employer]    PT Start Time 1527    PT Stop Time 1614    PT Time Calculation (min) 47 min    Activity Tolerance Patient tolerated treatment well    Behavior During Therapy WFL for tasks assessed/performed             Past Medical History:  Diagnosis Date   Arthritis    Back pain of lumbar region with sciatica    Cancer (HCC)    breast   Depression    Diabetes mellitus, type II (HCC)    Family history of anesthesia complication    grandfather died under anesthesia 70's- had heart issues   H/O carpal tunnel repair 2014   Headache(784.0)    Hyperlipidemia    Hypertension    Hypothyroidism    Medial meniscus tear    PONV (postoperative nausea and vomiting)    Sleep apnea    cpap since 10   Past Surgical History:  Procedure Laterality Date   BILATERAL TOTAL MASTECTOMY WITH AXILLARY LYMPH NODE DISSECTION     BREAST RECONSTRUCTION     CERVICAL DISC ARTHROPLASTY N/A 03/19/2013   Procedure: CERVICAL FIVE TO SIX, CERVICAL SIX TO SEVEN CERVICAL ANTERIOR DISC ARTHROPLASTY;  Surgeon: Barnett Abu, MD;  Location: [facility];  Service: Neurosurgery;  Laterality: N/A;  C56 C67 artificial disc replacement   CESAREAN SECTION     COLONOSCOPY     DILATATION & CURETTAGE/HYSTEROSCOPY WITH TRUECLEAR N/A 11/25/2013   Procedure: DILATATION & CURETTAGE/HYSTEROSCOPY WITH TRUCLEAR;  Surgeon: Meriel Pica, MD;  Location: [facility];  Service: Gynecology;  Laterality: N/A;   ECTOPIC PREGNANCY SURGERY     GALLBLADDER SURGERY     MOUTH SURGERY     child- dog bite   SPINAL FUSION     2023   STERIOD INJECTION Left 09/09/2022   Procedure: LEFT KNEE STEROID INJECTION;  Surgeon: Ollen Gross, MD;   Location: [facility];  Service: Orthopedics;  Laterality: Left;  left knee injection 0928   TONSILLECTOMY     TOTAL KNEE ARTHROPLASTY Right 09/09/2022   Procedure: TOTAL KNEE ARTHROPLASTY;  Surgeon: Ollen Gross, MD;  Location: [facility];  Service: Orthopedics;  Laterality: Right;   TUBAL LIGATION     Patient Active Problem List   Diagnosis Date Noted   OA (osteoarthritis) of knee 09/09/2022   Exertional dyspnea 04/17/2022   Elevated coronary artery calcium score 04/17/2022   Spondylolisthesis at L4-L5 level 12/27/2021   Ductal carcinoma in situ (DCIS) of left breast 01/19/2018   Generalized anxiety disorder 11/07/2017   Depression 11/07/2017   Insomnia 11/07/2017   Cervical spondylosis 03/19/2013    PCP: Creola Corn  REFERRING PROVIDER: Trudee Grip  REFERRING DIAG: R TKA 09/09/22  THERAPY DIAG:  Chronic bilateral back pain, unspecified back location  Rationale for Evaluation and Treatment: Rehabilitation  ONSET DATE: 09/09/22  SUBJECTIVE:   SUBJECTIVE STATEMENT :  Patient has an order for her low back pain, MD dx is spondylolisthesis.  Patient underwent a two level lumbar fusion December 2023.  She had a right TKA in August 2024,  patient reports that she has had some pain but feels like the pain has gotten  worse over the past 2 months.  Reports difficulty sleeping, sitting and standing  R TKA 8/19 L knee steroid injection 09/09/22  PAIN:  Are you having pain? Yes: NPRS scale: 9/10 Pain location: right low back, buttock and reports around to the front of the thigh Pain description: sharp really hurts Aggravating factors: difficulty sleeping, difficulty walking and standing and sitting Relieving factors: heat, Aleve , tramadol and methacarbomol  PRECAUTIONS: None  RED FLAGS: None   WEIGHT BEARING RESTRICTIONS: No  FALLS:  Has patient fallen in last 6 months? No  LIVING ENVIRONMENT: Lives with: lives with their family and lives with their spouse Lives in:  House/apartment Stairs: Yes: External: 3 steps; none Has following equipment at home: Single point cane and Walker - 2 wheeled  OCCUPATION: Not working  PLOF: Independent  PATIENT GOALS: less back pain  NEXT MD VISIT: 09/24/22  OBJECTIVE:   PATIENT SURVEYS:  FOTO 4  COGNITION: Overall cognitive status: Within functional limits for tasks assessed      EDEMA:  Increased swelling surrounding R knee  POSTURE: rounded shoulders and flexed trunk   PALPATION: Warm and TTP  LOWER EXTREMITY ROM:  Active ROM Right eval Left eval Right 09/24/22 Right 09/26/22 Right 10/02/22  11/01/22 12/09/22  Hip flexion          Hip extension          Hip abduction          Hip adduction          Hip internal rotation          Hip external rotation          Knee flexion  60  75* AROM, 86 PROM 82* AROM seated per pt request  78* self AAROM, 76* AROM  PROM 87 AROM 81 PROM 90 AROM 83 AROM 101 PROM 105  Knee extension -30  -15* AROM seated, -15 PROM supine   -16* supine heel prop with quad set  AROM 25 PROM 16 AROM 14 AROM 5 PROM 0  Ankle dorsiflexion          Ankle plantarflexion          Ankle inversion          Ankle eversion           (Blank rows = not tested)  LOWER EXTREMITY MMT:  MMT Right eval Left eval  Hip flexion 2   Hip extension    Hip abduction    Hip adduction    Hip internal rotation    Hip external rotation    Knee flexion 2   Knee extension 2   Ankle dorsiflexion    Ankle plantarflexion    Ankle inversion    Ankle eversion     (Blank rows = not tested)  LUMBAR ROM:  decreased 50% flexion, decreased 75% for extension and side bending, pain was worse with extension and right side bending. Hooklying is less painful that lying flat on back Mild tightness with pain during HS stretch, tight and painful piriformis bilaterally worse on the right , very weak core FUNCTIONAL TESTS:  5 times sit to stand: 30 seconds 03/20/23 Timed up and go (TUG): 1 min 28s  GAIT: Is  no without assistive device, mild antalgic on the right    TODAY'S TREATMENT:  DATE:  03/20/23 Evaluation of the lumbar spine order from Dr. Danielle Dess  03/17/23 Gait outside negotiated curbs, back building stairs step over step, outside walking and then in grass, slope, uneven terrain On upside down bosu balance and reaching 40# resisted gait fwd and backward, some back pain with this Passive HS and piriformis stretches Green tband clamshells Feet on ball K2C, trunk rotation, small posterior activation, isometric abs Used Tgun and focused on the right low back and the right buttock for pain and spasms  03/13/23 Home visit: Stairs: in side are 7", has a banister on the left going up part way and then on right after a turn, the bottom part of the banister is not completely sturdy Entrances:  front door 8" step no railing,  Back door steps are 8" at top due to threshold, but other stairs are 4"-7" with a railing on the left going up, we problem solved her turning toward the door jamb at the top of the stairs and using the door jamb to pull herself into the home, intead of the big step up and over. Bedroom entrance from outside, has railing on the right going up, 6"  Bathtub:  23" clearance, we worked on getting in and out using a couple of exercise mats as cushions, our best option was to sit on the edge of the tub, turn and bring feet in and slide or duck walk down into the tub.  Then to get out bend left knee up under her. roll to her left onto her knee and hand walk up and then step out Shower entrance is 8", would need to use walls to help get in and out of the shower   03/11/23 Resisted gait 30# with 6 inch step up 5 x rt, 5x left, 5 times each side- light touch needed with RT LE 6 inch step down 2 sets 10 with UE STS 14 inch box 3 sets 5 Wide BOS STS from mat with LLE  out and cued for full TKE on RT 2 sets 10 Leg Press unlocked for ROM 2 sets 10 Leg Press 30# RT LE only 2 sets 10 AROM RT LE seated 0-112 Balance and ROM on BOS and rocker board  03/06/23 Gait outside, stairs 6" step over step up and down with single hand rail On bosu # reaching On Bosu ball toss On bosu small squats 50# resisted gait all directions Practice with her multiple ways to get down and off the floor, using a quad fold, bi fold and single mat trying to simulate floor and garden bath tub  03/03/23 On wobble board balance On bosu reaching for numbers on wall On airex reaching for numbers on wall 6" steps with stretch Tmill push Passive stretch of the right knee, hip flexor and quad STM to the quad, back and buttock  02/26/23 Stairs 6" step overstep with handrail and very light HHA 8" step up with the right, repeated with SPC and counter 8" stair down with SPC and counter for support and PT hand on knee to give some support, (stepping down with the good leg) Used quad fold mat on ground and the garden buddy to simulate bathtub sitting and standing this was bottom 13" off the ground, we then tried double fold and now was 8" off the ground.  We then tried to get down on the ground walking her hands out and then kneeling and rolling over, then worked on long sitting and then rolling over and getting  gup by walking hands back Passive stretch  PATIENT EDUCATION:  Education details: POC and HEP Person educated: Patient Education method: Explanation Education comprehension: verbalized understanding  HOME EXERCISE PROGRAM: Access Code: [facility] Access Code: [email or web address] URL: [email or web address] Date: 03/20/2023 Prepared by: Stacie Glaze  Exercises - Hooklying Single Knee to Chest Stretch  - 2 x daily - 7 x weekly - 1 sets - 10 reps - 5 hold - Supine Double Knee to Chest  - 2 x daily - 7 x weekly - 1 sets - 10 reps - 5 hold - Supine Lower Trunk Rotation with  PLB  - 2 x daily - 7 x weekly - 1 sets - 10 reps - 5 hold - Supine Posterior Pelvic Tilt  - 2 x daily - 7 x weekly - 1 sets - 10 reps - 3 hold  ASSESSMENT:  CLINICAL IMPRESSION:   Patient has had an increase of LBP, she has an order in the chart from Dr. Danielle Dess for [place] to evaluate and treat the low back.  His dx was spondylolisthesis.  She is tight in the piriformis and painful, mild tightness in the HS.  She has limited lumbar ROM, pain exacerbated int he right buttock and leg with extension and right side bending.  She has a very weak core.  OBJECTIVE IMPAIRMENTS: Abnormal gait, cardiopulmonary status limiting activity, decreased activity tolerance, decreased endurance, decreased mobility, difficulty walking, decreased ROM, decreased strength, increased muscle spasms, impaired flexibility, improper body mechanics, postural dysfunction, and pain.   REHAB POTENTIAL: Good  CLINICAL DECISION MAKING: Stable/uncomplicated  EVALUATION COMPLEXITY: Low   GOALS: Goals reviewed with patient? Yes  SHORT TERM GOALS: Target date: 10/24/22  Patient will be independent with initial HEP. Goal status: 09/20/22 met  2.  Patient will be do TUG < 30s Baseline: 1 min 28s  Goal status: ongoing 10/07/22, 18.57s MET 10/09/22  3.  Patient will be able to do 5 sit to stands  Baseline: unable to do  Goal status: progressing 10/04/22, MET 10/09/22   LONG TERM GOALS: Target date: 01/31/23  Patient will be independent with advanced/ongoing HEP to improve outcomes and carryover.  Goal status: ongoing 11/06/22  2.  Patient will report at least 75% improvement in R knee pain to improve QOL. Baseline: 10/10 Goal status: met 02/03/23 60% better 11/20/22, 65% 12/06/22, 70% 12/17/22  3.  Patient will demonstrate improved R knee AROM to >/= 0-120 deg to allow for normal gait and stair mechanics. Baseline: 30-0-60 10/09/22: 16-0-78  11/01/22: 14-0-83 11/20/22: 10-97 12/06/22: 10-0-100 12/17/22: 8-0-104 Goal  status: continue to progress and getting closer 0-112  03/13/23  4.  Patient will demonstrate improved functional LE strength as demonstrated by 5/5. Baseline: 2/5 Goal status: MET 3+/5 10/09/22, 5/5 11/01/22   5.  Patient will be able to ambulate 500' with LRAD and normal gait pattern without increased pain to access community.  Baseline: using RW small in home ambulation distances Goal status: progressing w/SPC 12/02/22, doing some walking without AD   6.  Patient will report 42 on FOTO (patient reported outcome measure) to demonstrate improved functional ability. Baseline: 4, 40-11/11/24 Goal status: MET 53 12/17/22  7.  Decrease lumbar pain 50% 8.  Increase lumbar ROM 25% 9.  Report able to put shoes and socks on without pain > 4/10  PLAN:  PT FREQUENCY: 2x/week  PT DURATION: 12 weeks  PLANNED INTERVENTIONS: Therapeutic exercises, Therapeutic activity, Neuromuscular re-education, Balance training, Gait training, Patient/Family education, Self Care, Joint mobilization, Stair training, Lobbyist  stimulation, Cryotherapy, Moist heat, scar mobilization, Vasopneumatic device, and Manual therapy  PLAN FOR NEXT SESSION: Will continue to work on the knee and the functional issues that we have been addressing and add lumbar treatment for ROM, strength and function Patient Details  Name: [name] MRN: [id number] Date of Birth: 1960/05/07 Referring Provider:  Barnett Abu, MD  Encounter Date: 03/20/2023

## 2023-03-26 ENCOUNTER — Encounter: Payer: Self-pay | Admitting: Physical Therapy

## 2023-03-26 ENCOUNTER — Ambulatory Visit: Payer: No Typology Code available for payment source | Attending: Neurological Surgery | Admitting: Physical Therapy

## 2023-03-26 DIAGNOSIS — M25661 Stiffness of right knee, not elsewhere classified: Secondary | ICD-10-CM | POA: Diagnosis present

## 2023-03-26 DIAGNOSIS — M25561 Pain in right knee: Secondary | ICD-10-CM | POA: Diagnosis present

## 2023-03-26 DIAGNOSIS — R2689 Other abnormalities of gait and mobility: Secondary | ICD-10-CM | POA: Diagnosis present

## 2023-03-26 DIAGNOSIS — Z96651 Presence of right artificial knee joint: Secondary | ICD-10-CM | POA: Diagnosis present

## 2023-03-26 DIAGNOSIS — G8929 Other chronic pain: Secondary | ICD-10-CM | POA: Insufficient documentation

## 2023-03-26 DIAGNOSIS — M549 Dorsalgia, unspecified: Secondary | ICD-10-CM | POA: Insufficient documentation

## 2023-03-26 DIAGNOSIS — M6281 Muscle weakness (generalized): Secondary | ICD-10-CM | POA: Diagnosis present

## 2023-03-26 NOTE — Therapy (Signed)
 OUTPATIENT PHYSICAL THERAPY LOWER EXTREMITY TREATMENT     Patient Name: [name] MRN: [id number] DOB:[birth year], 63 y.o., female Today's Date: 03/26/2023  END OF SESSION:  PT End of Session - 03/26/23 1345     Visit Number 67    Date for PT Re-Evaluation 04/10/23    PT Start Time 1345    PT Stop Time 1430    PT Time Calculation (min) 45 min    Activity Tolerance Patient tolerated treatment well    Behavior During Therapy WFL for tasks assessed/performed             Past Medical History:  Diagnosis Date   Arthritis    Back pain of lumbar region with sciatica    Cancer (HCC)    breast   Depression    Diabetes mellitus, type II (HCC)    Family history of anesthesia complication    grandfather died under anesthesia 70's- had heart issues   H/O carpal tunnel repair 2014   Headache(784.0)    Hyperlipidemia    Hypertension    Hypothyroidism    Medial meniscus tear    PONV (postoperative nausea and vomiting)    Sleep apnea    cpap since 10   Past Surgical History:  Procedure Laterality Date   BILATERAL TOTAL MASTECTOMY WITH AXILLARY LYMPH NODE DISSECTION     BREAST RECONSTRUCTION     CERVICAL DISC ARTHROPLASTY N/A 03/19/2013   Procedure: CERVICAL FIVE TO SIX, CERVICAL SIX TO SEVEN CERVICAL ANTERIOR DISC ARTHROPLASTY;  Surgeon: Barnett Abu, MD;  Location: [facility];  Service: Neurosurgery;  Laterality: N/A;  C56 C67 artificial disc replacement   CESAREAN SECTION     COLONOSCOPY     DILATATION & CURETTAGE/HYSTEROSCOPY WITH TRUECLEAR N/A 11/25/2013   Procedure: DILATATION & CURETTAGE/HYSTEROSCOPY WITH TRUCLEAR;  Surgeon: Meriel Pica, MD;  Location: [facility];  Service: Gynecology;  Laterality: N/A;   ECTOPIC PREGNANCY SURGERY     GALLBLADDER SURGERY     MOUTH SURGERY     child- dog bite   SPINAL FUSION     2023   STERIOD INJECTION Left 09/09/2022   Procedure: LEFT KNEE STEROID INJECTION;  Surgeon: Ollen Gross, MD;  Location: [facility];  Service:  Orthopedics;  Laterality: Left;  left knee injection 0928   TONSILLECTOMY     TOTAL KNEE ARTHROPLASTY Right 09/09/2022   Procedure: TOTAL KNEE ARTHROPLASTY;  Surgeon: Ollen Gross, MD;  Location: [facility];  Service: Orthopedics;  Laterality: Right;   TUBAL LIGATION     Patient Active Problem List   Diagnosis Date Noted   OA (osteoarthritis) of knee 09/09/2022   Exertional dyspnea 04/17/2022   Elevated coronary artery calcium score 04/17/2022   Spondylolisthesis at L4-L5 level 12/27/2021   Ductal carcinoma in situ (DCIS) of left breast 01/19/2018   Generalized anxiety disorder 11/07/2017   Depression 11/07/2017   Insomnia 11/07/2017   Cervical spondylosis 03/19/2013    PCP: Creola Corn  REFERRING PROVIDER: Trudee Grip  REFERRING DIAG: R TKA 09/09/22  THERAPY DIAG:  Chronic bilateral back pain, unspecified back location  Muscle weakness (generalized)  Rationale for Evaluation and Treatment: Rehabilitation  ONSET DATE: 09/09/22  SUBJECTIVE:   SUBJECTIVE STATEMENT :  Back pain 9/10 constant shooting from the lower back, across the quads  R TKA 8/19 L knee steroid injection 09/09/22  PAIN:  Are you having pain? Yes: NPRS scale: 9/10 Pain location: right low back, buttock and reports around to the front of the thigh Pain description: sharp  really hurts Aggravating factors: difficulty sleeping, difficulty walking and standing and sitting Relieving factors: heat, Aleve , tramadol and methacarbomol  PRECAUTIONS: None  RED FLAGS: None   WEIGHT BEARING RESTRICTIONS: No  FALLS:  Has patient fallen in last 6 months? No  LIVING ENVIRONMENT: Lives with: lives with their family and lives with their spouse Lives in: House/apartment Stairs: Yes: External: 3 steps; none Has following equipment at home: Single point cane and Walker - 2 wheeled  OCCUPATION: Not working  PLOF: Independent  PATIENT GOALS: less back pain  NEXT MD VISIT: 09/24/22  OBJECTIVE:   PATIENT  SURVEYS:  FOTO 4  COGNITION: Overall cognitive status: Within functional limits for tasks assessed      EDEMA:  Increased swelling surrounding R knee  POSTURE: rounded shoulders and flexed trunk   PALPATION: Warm and TTP  LOWER EXTREMITY ROM:  Active ROM Right eval Left eval Right 09/24/22 Right 09/26/22 Right 10/02/22  11/01/22 12/09/22  Hip flexion          Hip extension          Hip abduction          Hip adduction          Hip internal rotation          Hip external rotation          Knee flexion  60  75* AROM, 86 PROM 82* AROM seated per pt request  78* self AAROM, 76* AROM  PROM 87 AROM 81 PROM 90 AROM 83 AROM 101 PROM 105  Knee extension -30  -15* AROM seated, -15 PROM supine   -16* supine heel prop with quad set  AROM 25 PROM 16 AROM 14 AROM 5 PROM 0  Ankle dorsiflexion          Ankle plantarflexion          Ankle inversion          Ankle eversion           (Blank rows = not tested)  LOWER EXTREMITY MMT:  MMT Right eval Left eval  Hip flexion 2   Hip extension    Hip abduction    Hip adduction    Hip internal rotation    Hip external rotation    Knee flexion 2   Knee extension 2   Ankle dorsiflexion    Ankle plantarflexion    Ankle inversion    Ankle eversion     (Blank rows = not tested)  LUMBAR ROM:  decreased 50% flexion, decreased 75% for extension and side bending, pain was worse with extension and right side bending. Hooklying is less painful that lying flat on back Mild tightness with pain during HS stretch, tight and painful piriformis bilaterally worse on the right , very weak core FUNCTIONAL TESTS:  5 times sit to stand: 30 seconds 03/20/23 Timed up and go (TUG): 1 min 28s  GAIT: Is no without assistive device, mild antalgic on the right    TODAY'S TREATMENT:  DATE:  03/26/23 NuStep L5 x 6 min Bridges LE on  pball bridges x5, K2C x10, Oblq   Passive stretching to HS, piriformis, ITB, Glute Shoulder Ext 5lb x10 Standing rows 5lb x12  03/20/23 Evaluation of the lumbar spine order from Dr. Danielle Dess  03/17/23 Gait outside negotiated curbs, back building stairs step over step, outside walking and then in grass, slope, uneven terrain On upside down bosu balance and reaching 40# resisted gait fwd and backward, some back pain with this Passive HS and piriformis stretches Green tband clamshells Feet on ball K2C, trunk rotation, small posterior activation, isometric abs Used Tgun and focused on the right low back and the right buttock for pain and spasms  03/13/23 Home visit: Stairs: in side are 7", has a banister on the left going up part way and then on right after a turn, the bottom part of the banister is not completely sturdy Entrances:  front door 8" step no railing,  Back door steps are 8" at top due to threshold, but other stairs are 4"-7" with a railing on the left going up, we problem solved her turning toward the door jamb at the top of the stairs and using the door jamb to pull herself into the home, intead of the big step up and over. Bedroom entrance from outside, has railing on the right going up, 6"  Bathtub:  23" clearance, we worked on getting in and out using a couple of exercise mats as cushions, our best option was to sit on the edge of the tub, turn and bring feet in and slide or duck walk down into the tub.  Then to get out bend left knee up under her. roll to her left onto her knee and hand walk up and then step out Shower entrance is 8", would need to use walls to help get in and out of the shower   03/11/23 Resisted gait 30# with 6 inch step up 5 x rt, 5x left, 5 times each side- light touch needed with RT LE 6 inch step down 2 sets 10 with UE STS 14 inch box 3 sets 5 Wide BOS STS from mat with LLE out and cued for full TKE on RT 2 sets 10 Leg Press unlocked for ROM 2 sets  10 Leg Press 30# RT LE only 2 sets 10 AROM RT LE seated 0-112 Balance and ROM on BOS and rocker board  03/06/23 Gait outside, stairs 6" step over step up and down with single hand rail On bosu # reaching On Bosu ball toss On bosu small squats 50# resisted gait all directions Practice with her multiple ways to get down and off the floor, using a quad fold, bi fold and single mat trying to simulate floor and garden bath tub  03/03/23 On wobble board balance On bosu reaching for numbers on wall On airex reaching for numbers on wall 6" steps with stretch Tmill push Passive stretch of the right knee, hip flexor and quad STM to the quad, back and buttock  02/26/23 Stairs 6" step overstep with handrail and very light HHA 8" step up with the right, repeated with SPC and counter 8" stair down with SPC and counter for support and PT hand on knee to give some support, (stepping down with the good leg) Used quad fold mat on ground and the garden buddy to simulate bathtub sitting and standing this was bottom 13" off the ground, we then tried double fold and now was 8"  off the ground.  We then tried to get down on the ground walking her hands out and then kneeling and rolling over, then worked on long sitting and then rolling over and getting gup by walking hands back Passive stretch  PATIENT EDUCATION:  Education details: POC and HEP Person educated: Patient Education method: Explanation Education comprehension: verbalized understanding  HOME EXERCISE PROGRAM: Access Code: [facility] Access Code: [email or web address] URL: [email or web address] Date: 03/20/2023 Prepared by: Stacie Glaze  Exercises - Hooklying Single Knee to Chest Stretch  - 2 x daily - 7 x weekly - 1 sets - 10 reps - 5 hold - Supine Double Knee to Chest  - 2 x daily - 7 x weekly - 1 sets - 10 reps - 5 hold - Supine Lower Trunk Rotation with PLB  - 2 x daily - 7 x weekly - 1 sets - 10 reps - 5 hold - Supine Posterior  Pelvic Tilt  - 2 x daily - 7 x weekly - 1 sets - 10 reps - 3 hold  ASSESSMENT:  CLINICAL IMPRESSION:   Patient has had an increase of LBP, she has an order in the chart from Dr. Danielle Dess for [place] to evaluate and treat the low back. Today's session focused on back and posture. Increase pain with pball bridges so pt instructed to only complete half the reps. She remains tight int he piriformis muscles. Some pain reported with Pball obliques going to her R. Tactile cues to keep shoulders down with shoulder ext. Will assess and progress as tolerated   OBJECTIVE IMPAIRMENTS: Abnormal gait, cardiopulmonary status limiting activity, decreased activity tolerance, decreased endurance, decreased mobility, difficulty walking, decreased ROM, decreased strength, increased muscle spasms, impaired flexibility, improper body mechanics, postural dysfunction, and pain.   REHAB POTENTIAL: Good  CLINICAL DECISION MAKING: Stable/uncomplicated  EVALUATION COMPLEXITY: Low   GOALS: Goals reviewed with patient? Yes  SHORT TERM GOALS: Target date: 10/24/22  Patient will be independent with initial HEP. Goal status: 09/20/22 met  2.  Patient will be do TUG < 30s Baseline: 1 min 28s  Goal status: ongoing 10/07/22, 18.57s MET 10/09/22  3.  Patient will be able to do 5 sit to stands  Baseline: unable to do  Goal status: progressing 10/04/22, MET 10/09/22   LONG TERM GOALS: Target date: 01/31/23  Patient will be independent with advanced/ongoing HEP to improve outcomes and carryover.  Goal status: ongoing 11/06/22  2.  Patient will report at least 75% improvement in R knee pain to improve QOL. Baseline: 10/10 Goal status: met 02/03/23 60% better 11/20/22, 65% 12/06/22, 70% 12/17/22  3.  Patient will demonstrate improved R knee AROM to >/= 0-120 deg to allow for normal gait and stair mechanics. Baseline: 30-0-60 10/09/22: 16-0-78  11/01/22: 14-0-83 11/20/22: 10-97 12/06/22: 10-0-100 12/17/22: 8-0-104 Goal  status: continue to progress and getting closer 0-112  03/13/23  4.  Patient will demonstrate improved functional LE strength as demonstrated by 5/5. Baseline: 2/5 Goal status: MET 3+/5 10/09/22, 5/5 11/01/22   5.  Patient will be able to ambulate 500' with LRAD and normal gait pattern without increased pain to access community.  Baseline: using RW small in home ambulation distances Goal status: progressing w/SPC 12/02/22, doing some walking without AD   6.  Patient will report 57 on FOTO (patient reported outcome measure) to demonstrate improved functional ability. Baseline: 4, 40-11/11/24 Goal status: MET 53 12/17/22  7.  Decrease lumbar pain 50% 8.  Increase lumbar ROM 25% 9.  Report able to  put shoes and socks on without pain > 4/10  PLAN:  PT FREQUENCY: 2x/week  PT DURATION: 12 weeks  PLANNED INTERVENTIONS: Therapeutic exercises, Therapeutic activity, Neuromuscular re-education, Balance training, Gait training, Patient/Family education, Self Care, Joint mobilization, Stair training, Electrical stimulation, Cryotherapy, Moist heat, scar mobilization, Vasopneumatic device, and Manual therapy  PLAN FOR NEXT SESSION: Will continue to work on the knee and the functional issues that we have been addressing and add lumbar treatment for ROM, strength and function, Add tband interventions to HEP      Debroah Baller, PTA

## 2023-03-28 ENCOUNTER — Ambulatory Visit (INDEPENDENT_AMBULATORY_CARE_PROVIDER_SITE_OTHER): Admitting: Professional Counselor

## 2023-03-28 ENCOUNTER — Encounter: Payer: Self-pay | Admitting: Professional Counselor

## 2023-03-28 DIAGNOSIS — F411 Generalized anxiety disorder: Secondary | ICD-10-CM | POA: Diagnosis not present

## 2023-03-28 DIAGNOSIS — F332 Major depressive disorder, recurrent severe without psychotic features: Secondary | ICD-10-CM

## 2023-03-28 NOTE — Progress Notes (Signed)
      [facility] Counselor/Therapist Progress Note  Patient ID: [name], MRN: [id number],    Date: 03/28/2023  Time Spent: 11:10 AM to 12:13 PM  Treatment Type: Family with patient  Patient was present with spouse for session.  Reported Symptoms: Anhedonia, low mood, sleep concerns, fatigue, appetite concerns, low self-esteem, trouble concentrating, vague SI no intent/plan, nervousness, worries, catastrophic thinking, breathlessness when anxious, interpersonal concerns, stress  Mental Status Exam:  Appearance:   Neat     Behavior:  Appropriate and Sharing  Motor:  Restlessness  Speech/Language:   Clear and Coherent and Normal Rate  Affect:  Tearful  Mood:  anxious, irritable, and sad  Thought process:  normal  Thought content:    WNL  Sensory/Perceptual disturbances:    WNL  Orientation:  oriented to person, place, time/date, and situation  Attention:  Good  Concentration:  Good  Memory:  WNL  Fund of knowledge:   Good  Insight:    Good  Judgment:   Good  Impulse Control:  Good   Risk Assessment: Danger to Self:  No Self-injurious Behavior: No Danger to Others: No Duty to Warn:no Physical Aggression / Violence:No  Access to Firearms a concern: No  Gang Involvement:No   Subjective: Patient presented to session to address concerns of anxiety and depression.  Her husband presented to session with her.  Patient voiced prescribing provider to have increased her Viibryd medication.  She voiced hopefulness for alleviation of symptoms.  She voiced struggling increasingly, and for marital conflict to have worsened.  Counselor facilitated PHQ-9 with a score of 22 and GAD-7 with a score of 12.  Patient and her husband discussed options for living apart.  Patient continued to voice feelings that she might "lose herself" if she stays in the relationship as it is now.  She voiced prayer and trust in God as a primary resource at this time.  Counselor patient and patient's spouse  discussed their dynamics, cycles and patterns, nonnegotiable's, self-regulation skills and the emotionally focused work that they have done, and together reflected on how to continue to progress.  They discussed practical problems such as financial issues and transparency, health and self-care issues, house needs, employment needs.  They discussed what would increase sense of safety.  Counselor facilitated Daily Examen relaxation and guided meditation intervention to help resource practice of silence, acceptance, gratitude, breathwork, grounding and attunement.  Couple voiced intervention as helpful.  Counselor provided instructions to facilitate activity at home.  Interventions: Solution-Oriented/Positive Psychology, Humanistic/Existential, Insight-Oriented, Family Systems, and Assessment, MBSR  Diagnosis:   ICD-10-CM   1. Severe episode of recurrent major depressive disorder, without psychotic features (HCC)  F33.2     2. Generalized anxiety disorder  F41.1       Plan: Patient is scheduled for follow-up; continue process work and developing coping skills.  Short-term goal between sessions to practice daily examine and other relaxation techniques so as to resource calm and stability.  Progress note was dictated with Dragon and reviewed for accuracy.  Gaspar Bidding, [facility] [employer]

## 2023-03-31 NOTE — Therapy (Signed)
 OUTPATIENT PHYSICAL THERAPY LOWER EXTREMITY TREATMENT     Patient Name: [name] MRN: [id number] DOB:[birth year], 63 y.o., female Today's Date: 04/01/2023  END OF SESSION:  PT End of Session - 04/01/23 1314     Visit Number 68    Date for PT Re-Evaluation 05/18/23    PT Start Time 1315    PT Stop Time 1400    PT Time Calculation (min) 45 min    Activity Tolerance Patient tolerated treatment well    Behavior During Therapy WFL for tasks assessed/performed              Past Medical History:  Diagnosis Date   Arthritis    Back pain of lumbar region with sciatica    Cancer (HCC)    breast   Depression    Diabetes mellitus, type II (HCC)    Family history of anesthesia complication    grandfather died under anesthesia 70's- had heart issues   H/O carpal tunnel repair 2014   Headache(784.0)    Hyperlipidemia    Hypertension    Hypothyroidism    Medial meniscus tear    PONV (postoperative nausea and vomiting)    Sleep apnea    cpap since 10   Past Surgical History:  Procedure Laterality Date   BILATERAL TOTAL MASTECTOMY WITH AXILLARY LYMPH NODE DISSECTION     BREAST RECONSTRUCTION     CERVICAL DISC ARTHROPLASTY N/A 03/19/2013   Procedure: CERVICAL FIVE TO SIX, CERVICAL SIX TO SEVEN CERVICAL ANTERIOR DISC ARTHROPLASTY;  Surgeon: Barnett Abu, MD;  Location: [facility];  Service: Neurosurgery;  Laterality: N/A;  C56 C67 artificial disc replacement   CESAREAN SECTION     COLONOSCOPY     DILATATION & CURETTAGE/HYSTEROSCOPY WITH TRUECLEAR N/A 11/25/2013   Procedure: DILATATION & CURETTAGE/HYSTEROSCOPY WITH TRUCLEAR;  Surgeon: Meriel Pica, MD;  Location: [facility];  Service: Gynecology;  Laterality: N/A;   ECTOPIC PREGNANCY SURGERY     GALLBLADDER SURGERY     MOUTH SURGERY     child- dog bite   SPINAL FUSION     2023   STERIOD INJECTION Left 09/09/2022   Procedure: LEFT KNEE STEROID INJECTION;  Surgeon: Ollen Gross, MD;  Location: [facility];  Service:  Orthopedics;  Laterality: Left;  left knee injection 0928   TONSILLECTOMY     TOTAL KNEE ARTHROPLASTY Right 09/09/2022   Procedure: TOTAL KNEE ARTHROPLASTY;  Surgeon: Ollen Gross, MD;  Location: [facility];  Service: Orthopedics;  Laterality: Right;   TUBAL LIGATION     Patient Active Problem List   Diagnosis Date Noted   OA (osteoarthritis) of knee 09/09/2022   Exertional dyspnea 04/17/2022   Elevated coronary artery calcium score 04/17/2022   Spondylolisthesis at L4-L5 level 12/27/2021   Ductal carcinoma in situ (DCIS) of left breast 01/19/2018   Generalized anxiety disorder 11/07/2017   Depression 11/07/2017   Insomnia 11/07/2017   Cervical spondylosis 03/19/2013    PCP: Creola Corn  REFERRING PROVIDER: Trudee Grip  REFERRING DIAG: R TKA 09/09/22  THERAPY DIAG:  Chronic bilateral back pain, unspecified back location  Muscle weakness (generalized)  S/P total knee arthroplasty, right  Other abnormalities of gait and mobility  Rationale for Evaluation and Treatment: Rehabilitation  ONSET DATE: 09/09/22  SUBJECTIVE:   SUBJECTIVE STATEMENT :  My back hurts, my knee hurts but mostly my knee. Been doing the exercises 2x a day. 8/10 pain.   R TKA 8/19 L knee steroid injection 09/09/22  PAIN:  Are you having pain?  Yes: NPRS scale: 9/10 Pain location: right low back, buttock and reports around to the front of the thigh Pain description: sharp really hurts Aggravating factors: difficulty sleeping, difficulty walking and standing and sitting Relieving factors: heat, Aleve , tramadol and methacarbomol  PRECAUTIONS: None  RED FLAGS: None   WEIGHT BEARING RESTRICTIONS: No  FALLS:  Has patient fallen in last 6 months? No  LIVING ENVIRONMENT: Lives with: lives with their family and lives with their spouse Lives in: House/apartment Stairs: Yes: External: 3 steps; none Has following equipment at home: Single point cane and Walker - 2 wheeled  OCCUPATION: Not  working  PLOF: Independent  PATIENT GOALS: less back pain  NEXT MD VISIT: 09/24/22  OBJECTIVE:   PATIENT SURVEYS:  FOTO 4  COGNITION: Overall cognitive status: Within functional limits for tasks assessed      EDEMA:  Increased swelling surrounding R knee  POSTURE: rounded shoulders and flexed trunk   PALPATION: Warm and TTP  LOWER EXTREMITY ROM:  Active ROM Right eval Left eval Right 09/24/22 Right 09/26/22 Right 10/02/22  11/01/22 12/09/22  Hip flexion          Hip extension          Hip abduction          Hip adduction          Hip internal rotation          Hip external rotation          Knee flexion  60  75* AROM, 86 PROM 82* AROM seated per pt request  78* self AAROM, 76* AROM  PROM 87 AROM 81 PROM 90 AROM 83 AROM 101 PROM 105  Knee extension -30  -15* AROM seated, -15 PROM supine   -16* supine heel prop with quad set  AROM 25 PROM 16 AROM 14 AROM 5 PROM 0  Ankle dorsiflexion          Ankle plantarflexion          Ankle inversion          Ankle eversion           (Blank rows = not tested)  LOWER EXTREMITY MMT:  MMT Right eval Left eval  Hip flexion 2   Hip extension    Hip abduction    Hip adduction    Hip internal rotation    Hip external rotation    Knee flexion 2   Knee extension 2   Ankle dorsiflexion    Ankle plantarflexion    Ankle inversion    Ankle eversion     (Blank rows = not tested)  LUMBAR ROM:  decreased 50% flexion, decreased 75% for extension and side bending, pain was worse with extension and right side bending. Hooklying is less painful that lying flat on back Mild tightness with pain during HS stretch, tight and painful piriformis bilaterally worse on the right , very weak core FUNCTIONAL TESTS:  5 times sit to stand: 30 seconds 03/20/23 Timed up and go (TUG): 1 min 28s  GAIT: Is no without assistive device, mild antalgic on the right    TODAY'S TREATMENT:  DATE:  04/01/23 Walk outdoors  NuStep L5x9mins  Seated row 20# 2x10 Lat pull down 20# 2x10 BlackTB ext 2x10 Supine LE stretches- HS, LTR, SKTC, piriformis, glutes    03/26/23 NuStep L5 x 6 min Bridges LE on pball bridges x5, K2C x10, Oblq   Passive stretching to HS, piriformis, ITB, Glute Shoulder Ext 5lb x10 Standing rows 5lb x12  03/20/23 Evaluation of the lumbar spine order from Dr. Danielle Dess  03/17/23 Gait outside negotiated curbs, back building stairs step over step, outside walking and then in grass, slope, uneven terrain On upside down bosu balance and reaching 40# resisted gait fwd and backward, some back pain with this Passive HS and piriformis stretches Green tband clamshells Feet on ball K2C, trunk rotation, small posterior activation, isometric abs Used Tgun and focused on the right low back and the right buttock for pain and spasms  03/13/23 Home visit: Stairs: in side are 7", has a banister on the left going up part way and then on right after a turn, the bottom part of the banister is not completely sturdy Entrances:  front door 8" step no railing,  Back door steps are 8" at top due to threshold, but other stairs are 4"-7" with a railing on the left going up, we problem solved her turning toward the door jamb at the top of the stairs and using the door jamb to pull herself into the home, intead of the big step up and over. Bedroom entrance from outside, has railing on the right going up, 6"  Bathtub:  23" clearance, we worked on getting in and out using a couple of exercise mats as cushions, our best option was to sit on the edge of the tub, turn and bring feet in and slide or duck walk down into the tub.  Then to get out bend left knee up under her. roll to her left onto her knee and hand walk up and then step out Shower entrance is 8", would need to use walls to help get in and out of the  shower   03/11/23 Resisted gait 30# with 6 inch step up 5 x rt, 5x left, 5 times each side- light touch needed with RT LE 6 inch step down 2 sets 10 with UE STS 14 inch box 3 sets 5 Wide BOS STS from mat with LLE out and cued for full TKE on RT 2 sets 10 Leg Press unlocked for ROM 2 sets 10 Leg Press 30# RT LE only 2 sets 10 AROM RT LE seated 0-112 Balance and ROM on BOS and rocker board  03/06/23 Gait outside, stairs 6" step over step up and down with single hand rail On bosu # reaching On Bosu ball toss On bosu small squats 50# resisted gait all directions Practice with her multiple ways to get down and off the floor, using a quad fold, bi fold and single mat trying to simulate floor and garden bath tub  03/03/23 On wobble board balance On bosu reaching for numbers on wall On airex reaching for numbers on wall 6" steps with stretch Tmill push Passive stretch of the right knee, hip flexor and quad STM to the quad, back and buttock  02/26/23 Stairs 6" step overstep with handrail and very light HHA 8" step up with the right, repeated with [facility] and counter 8" stair down with SPC and counter for support and PT hand on knee to give some support, (stepping down with the good leg) Used quad fold  mat on ground and the garden buddy to simulate bathtub sitting and standing this was bottom 13" off the ground, we then tried double fold and now was 8" off the ground.  We then tried to get down on the ground walking her hands out and then kneeling and rolling over, then worked on long sitting and then rolling over and getting gup by walking hands back Passive stretch  PATIENT EDUCATION:  Education details: POC and HEP Person educated: Patient Education method: Explanation Education comprehension: verbalized understanding  HOME EXERCISE PROGRAM: Access Code: [facility] Access Code: [email or web address] URL: [email or web address] Date: 03/20/2023 Prepared by: Stacie Glaze  Exercises - Hooklying Single Knee to Chest Stretch  - 2 x daily - 7 x weekly - 1 sets - 10 reps - 5 hold - Supine Double Knee to Chest  - 2 x daily - 7 x weekly - 1 sets - 10 reps - 5 hold - Supine Lower Trunk Rotation with PLB  - 2 x daily - 7 x weekly - 1 sets - 10 reps - 5 hold - Supine Posterior Pelvic Tilt  - 2 x daily - 7 x weekly - 1 sets - 10 reps - 3 hold  ASSESSMENT:  CLINICAL IMPRESSION:   Patient returns with ongoing back and knee pain. Her back is worse than the knee. We worked on some gentle strengthening with machine level interventions. Tactile cues needed to prevent shoulder shrugging and overuse of upper traps. Followed by stretching, she reports pain in back with figure 4 stretching in supine. She does have ongoing core weakness and will benefit from low back and core strengthening. Will progress as tolerated.   OBJECTIVE IMPAIRMENTS: Abnormal gait, cardiopulmonary status limiting activity, decreased activity tolerance, decreased endurance, decreased mobility, difficulty walking, decreased ROM, decreased strength, increased muscle spasms, impaired flexibility, improper body mechanics, postural dysfunction, and pain.   REHAB POTENTIAL: Good  CLINICAL DECISION MAKING: Stable/uncomplicated  EVALUATION COMPLEXITY: Low   GOALS: Goals reviewed with patient? Yes  SHORT TERM GOALS: Target date: 10/24/22  Patient will be independent with initial HEP. Goal status: 09/20/22 met  2.  Patient will be do TUG < 30s Baseline: 1 min 28s  Goal status: ongoing 10/07/22, 18.57s MET 10/09/22  3.  Patient will be able to do 5 sit to stands  Baseline: unable to do  Goal status: progressing 10/04/22, MET 10/09/22   LONG TERM GOALS: Target date: 01/31/23  Patient will be independent with advanced/ongoing HEP to improve outcomes and carryover.  Goal status: ongoing 11/06/22  2.  Patient will report at least 75% improvement in R knee pain to improve QOL. Baseline: 10/10 Goal  status: met 02/03/23 60% better 11/20/22, 65% 12/06/22, 70% 12/17/22  3.  Patient will demonstrate improved R knee AROM to >/= 0-120 deg to allow for normal gait and stair mechanics. Baseline: 30-0-60 10/09/22: 16-0-78  11/01/22: 14-0-83 11/20/22: 10-97 12/06/22: 10-0-100 12/17/22: 8-0-104 Goal status: continue to progress and getting closer 0-112  03/13/23  4.  Patient will demonstrate improved functional LE strength as demonstrated by 5/5. Baseline: 2/5 Goal status: MET 3+/5 10/09/22, 5/5 11/01/22   5.  Patient will be able to ambulate 500' with LRAD and normal gait pattern without increased pain to access community.  Baseline: using RW small in home ambulation distances Goal status: progressing w/SPC 12/02/22, doing some walking without AD   6.  Patient will report 38 on FOTO (patient reported outcome measure) to demonstrate improved functional ability. Baseline: 4, 40-11/11/24 Goal status: MET  53 12/17/22  7.  Decrease lumbar pain 50%  Goal status:   8.  Increase lumbar ROM 25%  Goal status:   9.  Report able to put shoes and socks on without pain > 4/10  Goal status:   PLAN:  PT FREQUENCY: 2x/week  PT DURATION: 12 weeks  PLANNED INTERVENTIONS: Therapeutic exercises, Therapeutic activity, Neuromuscular re-education, Balance training, Gait training, Patient/Family education, Self Care, Joint mobilization, Stair training, Electrical stimulation, Cryotherapy, Moist heat, scar mobilization, Vasopneumatic device, and Manual therapy  PLAN FOR NEXT SESSION: Will continue to work on the knee and the functional issues that we have been addressing and add lumbar treatment for ROM, strength and function, Add tband interventions to HEP    Cassie Freer, PT, DPT

## 2023-04-01 ENCOUNTER — Ambulatory Visit (INDEPENDENT_AMBULATORY_CARE_PROVIDER_SITE_OTHER): Payer: No Typology Code available for payment source | Admitting: Professional Counselor

## 2023-04-01 ENCOUNTER — Ambulatory Visit: Payer: No Typology Code available for payment source

## 2023-04-01 ENCOUNTER — Encounter: Payer: Self-pay | Admitting: Professional Counselor

## 2023-04-01 DIAGNOSIS — F331 Major depressive disorder, recurrent, moderate: Secondary | ICD-10-CM

## 2023-04-01 DIAGNOSIS — M549 Dorsalgia, unspecified: Secondary | ICD-10-CM | POA: Diagnosis not present

## 2023-04-01 DIAGNOSIS — F411 Generalized anxiety disorder: Secondary | ICD-10-CM

## 2023-04-01 DIAGNOSIS — G8929 Other chronic pain: Secondary | ICD-10-CM

## 2023-04-01 DIAGNOSIS — M6281 Muscle weakness (generalized): Secondary | ICD-10-CM

## 2023-04-01 DIAGNOSIS — R2689 Other abnormalities of gait and mobility: Secondary | ICD-10-CM

## 2023-04-01 DIAGNOSIS — Z96651 Presence of right artificial knee joint: Secondary | ICD-10-CM

## 2023-04-01 NOTE — Progress Notes (Signed)
      [facility] Counselor/Therapist Progress Note  Patient ID: [name], MRN: [id number],    Date: 04/22/2023  Time Spent: 4:03 PM to 5:05 PM  Treatment Type: Family with patient  Patient was present with spouse for session.  Reported Symptoms: Worries, stress, sadness, low mood, restlessness, interpersonal concerns  Mental Status Exam:  Appearance:   Neat     Behavior:  Appropriate, Sharing, and Motivated  Motor:  Normal  Speech/Language:   Clear and Coherent and Normal Rate  Affect:  Appropriate and Congruent  Mood:  normal  Thought process:  normal  Thought content:    WNL  Sensory/Perceptual disturbances:    WNL  Orientation:  oriented to person, place, time/date, and situation  Attention:  Good  Concentration:  Good  Memory:  WNL  Fund of knowledge:   Good  Insight:    Good  Judgment:   Good  Impulse Control:  Good   Risk Assessment: Danger to Self:  No Self-injurious Behavior: No Danger to Others: No Duty to Warn:no Physical Aggression / Violence:No  Access to Firearms a concern: No  Gang Involvement:No   Subjective: Patient presented to session to address concerns of anxiety and depression.  She reported mixed progress at this time.  She reported to session with her husband.  Counselor patient and patient has been reflecting on guided meditation, breath work and relaxation technique from last session.  They voiced intervention as helpful with self and marital reflections and for increasing experience of groundedness and emotional regulation.  They discussed continuation of conflict however more so tensions and exacerbation since last session.  Counselor affirmed progress and helped facilitate insight into concerns and with identification of personal goals to address between sessions.  Patient identified short-term goals to help support patient in alleviating symptomology.  Patient identified intention to make financial advisement appointment and collaborate on  an agenda with husband ahead of the appointment, and to work on selling a truck which will help alleviate stress.  Interventions: Solution-Oriented/Positive Psychology, Humanistic/Existential, Insight-Oriented, and Family Systems  Diagnosis:   ICD-10-CM   1. Generalized anxiety disorder  F41.1     2. Major depressive disorder, recurrent episode, moderate (HCC)  F33.1       Plan: Patient to return for follow-up; continue process work and developing coping skills.  Patient to address short-term goals between sessions as identified above.  Progress note was dictated with Dragon and reviewed for accuracy.  Gaspar Bidding, [facility]

## 2023-04-03 ENCOUNTER — Ambulatory Visit: Payer: No Typology Code available for payment source | Admitting: Physical Therapy

## 2023-04-03 DIAGNOSIS — M6281 Muscle weakness (generalized): Secondary | ICD-10-CM

## 2023-04-03 DIAGNOSIS — G8929 Other chronic pain: Secondary | ICD-10-CM

## 2023-04-03 DIAGNOSIS — M549 Dorsalgia, unspecified: Secondary | ICD-10-CM | POA: Diagnosis not present

## 2023-04-03 NOTE — Therapy (Signed)
 OUTPATIENT PHYSICAL THERAPY LOWER EXTREMITY TREATMENT     Patient Name: [name] MRN: [id number] DOB:[birth year], 63 y.o., female Today's Date: 04/03/2023  END OF SESSION:  PT End of Session - 04/03/23 1143     Visit Number 69    Date for PT Re-Evaluation 05/18/23    Authorization Type [employer]    PT Start Time 1145    PT Stop Time 1230    PT Time Calculation (min) 45 min              Past Medical History:  Diagnosis Date   Arthritis    Back pain of lumbar region with sciatica    Cancer (HCC)    breast   Depression    Diabetes mellitus, type II (HCC)    Family history of anesthesia complication    grandfather died under anesthesia 70's- had heart issues   H/O carpal tunnel repair 2014   Headache(784.0)    Hyperlipidemia    Hypertension    Hypothyroidism    Medial meniscus tear    PONV (postoperative nausea and vomiting)    Sleep apnea    cpap since 10   Past Surgical History:  Procedure Laterality Date   BILATERAL TOTAL MASTECTOMY WITH AXILLARY LYMPH NODE DISSECTION     BREAST RECONSTRUCTION     CERVICAL DISC ARTHROPLASTY N/A 03/19/2013   Procedure: CERVICAL FIVE TO SIX, CERVICAL SIX TO SEVEN CERVICAL ANTERIOR DISC ARTHROPLASTY;  Surgeon: Barnett Abu, MD;  Location: [facility];  Service: Neurosurgery;  Laterality: N/A;  C56 C67 artificial disc replacement   CESAREAN SECTION     COLONOSCOPY     DILATATION & CURETTAGE/HYSTEROSCOPY WITH TRUECLEAR N/A 11/25/2013   Procedure: DILATATION & CURETTAGE/HYSTEROSCOPY WITH TRUCLEAR;  Surgeon: Meriel Pica, MD;  Location: [facility];  Service: Gynecology;  Laterality: N/A;   ECTOPIC PREGNANCY SURGERY     GALLBLADDER SURGERY     MOUTH SURGERY     child- dog bite   SPINAL FUSION     2023   STERIOD INJECTION Left 09/09/2022   Procedure: LEFT KNEE STEROID INJECTION;  Surgeon: Ollen Gross, MD;  Location: [facility];  Service: Orthopedics;  Laterality: Left;  left knee injection 0928   TONSILLECTOMY     TOTAL KNEE  ARTHROPLASTY Right 09/09/2022   Procedure: TOTAL KNEE ARTHROPLASTY;  Surgeon: Ollen Gross, MD;  Location: [facility];  Service: Orthopedics;  Laterality: Right;   TUBAL LIGATION     Patient Active Problem List   Diagnosis Date Noted   OA (osteoarthritis) of knee 09/09/2022   Exertional dyspnea 04/17/2022   Elevated coronary artery calcium score 04/17/2022   Spondylolisthesis at L4-L5 level 12/27/2021   Ductal carcinoma in situ (DCIS) of left breast 01/19/2018   Generalized anxiety disorder 11/07/2017   Depression 11/07/2017   Insomnia 11/07/2017   Cervical spondylosis 03/19/2013    PCP: Creola Corn  REFERRING PROVIDER: Trudee Grip  REFERRING DIAG: R TKA 09/09/22  THERAPY DIAG:  Chronic bilateral back pain, unspecified back location  Muscle weakness (generalized)  Rationale for Evaluation and Treatment: Rehabilitation  ONSET DATE: 09/09/22  SUBJECTIVE:   SUBJECTIVE STATEMENT :  My back hurts a lot today, last night I could barely get in and out of bed. Pain back shooting down RT side. Had 2 appts yesterday and took dog to Vet but used got BM. " Can I request TENS and MH today"  R TKA 8/19 L knee steroid injection 09/09/22  PAIN:  Are you having pain? Yes: NPRS  scale: 9/10 Pain location: right low back, buttock and reports around to the front of the thigh Pain description: sharp really hurts Aggravating factors: difficulty sleeping, difficulty walking and standing and sitting Relieving factors: heat, Aleve , tramadol and methacarbomol  PRECAUTIONS: None  RED FLAGS: None   WEIGHT BEARING RESTRICTIONS: No  FALLS:  Has patient fallen in last 6 months? No  LIVING ENVIRONMENT: Lives with: lives with their family and lives with their spouse Lives in: House/apartment Stairs: Yes: External: 3 steps; none Has following equipment at home: Single point cane and Walker - 2 wheeled  OCCUPATION: Not working  PLOF: Independent  PATIENT GOALS: less back pain  NEXT MD  VISIT: 09/24/22  OBJECTIVE:   PATIENT SURVEYS:  FOTO 4  COGNITION: Overall cognitive status: Within functional limits for tasks assessed      EDEMA:  Increased swelling surrounding R knee  POSTURE: rounded shoulders and flexed trunk   PALPATION: Warm and TTP  LOWER EXTREMITY ROM:  Active ROM Right eval Left eval Right 09/24/22 Right 09/26/22 Right 10/02/22  11/01/22 12/09/22  Hip flexion          Hip extension          Hip abduction          Hip adduction          Hip internal rotation          Hip external rotation          Knee flexion  60  75* AROM, 86 PROM 82* AROM seated per pt request  78* self AAROM, 76* AROM  PROM 87 AROM 81 PROM 90 AROM 83 AROM 101 PROM 105  Knee extension -30  -15* AROM seated, -15 PROM supine   -16* supine heel prop with quad set  AROM 25 PROM 16 AROM 14 AROM 5 PROM 0  Ankle dorsiflexion          Ankle plantarflexion          Ankle inversion          Ankle eversion           (Blank rows = not tested)  LOWER EXTREMITY MMT:  MMT Right eval Left eval  Hip flexion 2   Hip extension    Hip abduction    Hip adduction    Hip internal rotation    Hip external rotation    Knee flexion 2   Knee extension 2   Ankle dorsiflexion    Ankle plantarflexion    Ankle inversion    Ankle eversion     (Blank rows = not tested)  LUMBAR ROM:  decreased 50% flexion, decreased 75% for extension and side bending, pain was worse with extension and right side bending. Hooklying is less painful that lying flat on back Mild tightness with pain during HS stretch, tight and painful piriformis bilaterally worse on the right , very weak core FUNCTIONAL TESTS:  5 times sit to stand: 30 seconds 03/20/23 Timed up and go (TUG): 1 min 28s  GAIT: Is no without assistive device, mild antalgic on the right    TODAY'S TREATMENT:  DATE:    04/03/23 Nustep L 5 6 min Sitting on dyna disc pelvic ROM 10x for pelvic ROM and stab Sitting on dyna disc for stab LAQ,hip flex,hip abd 10 x each Sitting on dyna disc shld row and ext red tband 2 sets 10 Supine LE stretches- HS, LTR, SKTC, piriformis, glutes RT LE NT stretching-positive MH/IFC RT LB supine     04/01/23 Walk outdoors  NuStep L5x82mins  Seated row 20# 2x10 Lat pull down 20# 2x10 BlackTB ext 2x10 Supine LE stretches- HS, LTR, SKTC, piriformis, glutes    03/26/23 NuStep L5 x 6 min Bridges LE on pball bridges x5, K2C x10, Oblq   Passive stretching to HS, piriformis, ITB, Glute Shoulder Ext 5lb x10 Standing rows 5lb x12  03/20/23 Evaluation of the lumbar spine order from Dr. Danielle Dess  03/17/23 Gait outside negotiated curbs, back building stairs step over step, outside walking and then in grass, slope, uneven terrain On upside down bosu balance and reaching 40# resisted gait fwd and backward, some back pain with this Passive HS and piriformis stretches Green tband clamshells Feet on ball K2C, trunk rotation, small posterior activation, isometric abs Used Tgun and focused on the right low back and the right buttock for pain and spasms  03/13/23 Home visit: Stairs: in side are 7", has a banister on the left going up part way and then on right after a turn, the bottom part of the banister is not completely sturdy Entrances:  front door 8" step no railing,  Back door steps are 8" at top due to threshold, but other stairs are 4"-7" with a railing on the left going up, we problem solved her turning toward the door jamb at the top of the stairs and using the door jamb to pull herself into the home, intead of the big step up and over. Bedroom entrance from outside, has railing on the right going up, 6"  Bathtub:  23" clearance, we worked on getting in and out using a couple of exercise mats as cushions, our best option was to sit on the edge of the tub, turn and bring feet  in and slide or duck walk down into the tub.  Then to get out bend left knee up under her. roll to her left onto her knee and hand walk up and then step out Shower entrance is 8", would need to use walls to help get in and out of the shower   03/11/23 Resisted gait 30# with 6 inch step up 5 x rt, 5x left, 5 times each side- light touch needed with RT LE 6 inch step down 2 sets 10 with UE STS 14 inch box 3 sets 5 Wide BOS STS from mat with LLE out and cued for full TKE on RT 2 sets 10 Leg Press unlocked for ROM 2 sets 10 Leg Press 30# RT LE only 2 sets 10 AROM RT LE seated 0-112 Balance and ROM on BOS and rocker board  03/06/23 Gait outside, stairs 6" step over step up and down with single hand rail On bosu # reaching On Bosu ball toss On bosu small squats 50# resisted gait all directions Practice with her multiple ways to get down and off the floor, using a quad fold, bi fold and single mat trying to simulate floor and garden bath tub  03/03/23 On wobble board balance On bosu reaching for numbers on wall On airex reaching for numbers on wall 6" steps with stretch Tmill push Passive stretch  of the right knee, hip flexor and quad STM to the quad, back and buttock  02/26/23 Stairs 6" step overstep with handrail and very light HHA 8" step up with the right, repeated with SPC and counter 8" stair down with SPC and counter for support and PT hand on knee to give some support, (stepping down with the good leg) Used quad fold mat on ground and the garden buddy to simulate bathtub sitting and standing this was bottom 13" off the ground, we then tried double fold and now was 8" off the ground.  We then tried to get down on the ground walking her hands out and then kneeling and rolling over, then worked on long sitting and then rolling over and getting gup by walking hands back Passive stretch  PATIENT EDUCATION:  Education details: POC and HEP Person educated: Patient Education method:  Explanation Education comprehension: verbalized understanding  HOME EXERCISE PROGRAM: Access Code: [facility] Access Code: [email or web address] URL: [email or web address] Date: 03/20/2023 Prepared by: Stacie Glaze  Exercises - Hooklying Single Knee to Chest Stretch  - 2 x daily - 7 x weekly - 1 sets - 10 reps - 5 hold - Supine Double Knee to Chest  - 2 x daily - 7 x weekly - 1 sets - 10 reps - 5 hold - Supine Lower Trunk Rotation with PLB  - 2 x daily - 7 x weekly - 1 sets - 10 reps - 5 hold - Supine Posterior Pelvic Tilt  - 2 x daily - 7 x weekly - 1 sets - 10 reps - 3 hold  ASSESSMENT:  CLINICAL IMPRESSION:   Pt arrived today c/o increased LBP with RT leg shooting pain and she was unsure why, stated had 2 appts yesterday and took MD to Vet but used good BM. Pt states doing all her HEPs at home for knee and back. Documented goals with no changes at this time. OBJECTIVE IMPAIRMENTS: Abnormal gait, cardiopulmonary status limiting activity, decreased activity tolerance, decreased endurance, decreased mobility, difficulty walking, decreased ROM, decreased strength, increased muscle spasms, impaired flexibility, improper body mechanics, postural dysfunction, and pain.   REHAB POTENTIAL: Good  CLINICAL DECISION MAKING: Stable/uncomplicated  EVALUATION COMPLEXITY: Low   GOALS: Goals reviewed with patient? Yes  SHORT TERM GOALS: Target date: 10/24/22  Patient will be independent with initial HEP. Goal status: 09/20/22 met  2.  Patient will be do TUG < 30s Baseline: 1 min 28s  Goal status: ongoing 10/07/22, 18.57s MET 10/09/22  3.  Patient will be able to do 5 sit to stands  Baseline: unable to do  Goal status: progressing 10/04/22, MET 10/09/22   LONG TERM GOALS: Target date: 01/31/23  Patient will be independent with advanced/ongoing HEP to improve outcomes and carryover.  Goal status: ongoing 11/06/22  2.  Patient will report at least 75% improvement in R knee pain to  improve QOL. Baseline: 10/10 Goal status: met 02/03/23 60% better 11/20/22, 65% 12/06/22, 70% 12/17/22  3.  Patient will demonstrate improved R knee AROM to >/= 0-120 deg to allow for normal gait and stair mechanics. Baseline: 30-0-60 10/09/22: 16-0-78  11/01/22: 14-0-83 11/20/22: 10-97 12/06/22: 10-0-100 12/17/22: 8-0-104 Goal status: continue to progress and getting closer 0-112  03/13/23  4.  Patient will demonstrate improved functional LE strength as demonstrated by 5/5. Baseline: 2/5 Goal status: MET 3+/5 10/09/22, 5/5 11/01/22   5.  Patient will be able to ambulate 500' with LRAD and normal gait pattern without increased pain to access community.  Baseline: using RW small in home ambulation distances Goal status: progressing w/SPC 12/02/22, doing some walking without AD   6.  Patient will report 53 on FOTO (patient reported outcome measure) to demonstrate improved functional ability. Baseline: 4, 40-11/11/24 Goal status: MET 53 12/17/22  7.  Decrease lumbar pain 50%  Goal status: 04/03/23 on going  8.  Increase lumbar ROM 25%  Goal status: 04/03/23 no changes from eval  9.  Report able to put shoes and socks on without pain > 4/10  Goal status:   PLAN:  PT FREQUENCY: 2x/week  PT DURATION: 12 weeks  PLANNED INTERVENTIONS: Therapeutic exercises, Therapeutic activity, Neuromuscular re-education, Balance training, Gait training, Patient/Family education, Self Care, Joint mobilization, Stair training, Electrical stimulation, Cryotherapy, Moist heat, scar mobilization, Vasopneumatic device, and Manual therapy  PLAN FOR NEXT SESSION: Will continue to work on the knee and the functional issues that we have been addressing and add lumbar treatment for ROM, strength and function, Add tband interventions to HEP  Dr Danielle Dess 04/15/23    Patient Details  Name: [name] MRN: [id number] Date of Birth: [birth year] Referring Provider:  Barnett Abu, MD  Encounter Date:  04/03/2023   Suanne Marker, PTA 04/03/2023, 11:44 AM  [facility] [address] [facility] [place], [place], [address] Phone: [phone]   Fax:  [phone]

## 2023-04-08 ENCOUNTER — Encounter: Payer: Self-pay | Admitting: Physical Therapy

## 2023-04-08 ENCOUNTER — Ambulatory Visit: Payer: No Typology Code available for payment source | Admitting: Physical Therapy

## 2023-04-08 DIAGNOSIS — G8929 Other chronic pain: Secondary | ICD-10-CM

## 2023-04-08 DIAGNOSIS — Z96651 Presence of right artificial knee joint: Secondary | ICD-10-CM

## 2023-04-08 DIAGNOSIS — M6281 Muscle weakness (generalized): Secondary | ICD-10-CM

## 2023-04-08 DIAGNOSIS — M549 Dorsalgia, unspecified: Secondary | ICD-10-CM | POA: Diagnosis not present

## 2023-04-08 DIAGNOSIS — M25561 Pain in right knee: Secondary | ICD-10-CM

## 2023-04-08 DIAGNOSIS — R2689 Other abnormalities of gait and mobility: Secondary | ICD-10-CM

## 2023-04-08 DIAGNOSIS — M25661 Stiffness of right knee, not elsewhere classified: Secondary | ICD-10-CM

## 2023-04-08 NOTE — Therapy (Signed)
 OUTPATIENT PHYSICAL THERAPY LOWER EXTREMITY TREATMENT     Patient Name: [name] MRN: [id number] DOB:[birth year], 63 y.o., female Today's Date: 04/08/2023  END OF SESSION:  PT End of Session - 04/08/23 1526     Visit Number 70    Date for PT Re-Evaluation 05/18/23    Authorization Type [employer]    PT Start Time 1526    PT Stop Time 1626    PT Time Calculation (min) 60 min    Activity Tolerance Patient tolerated treatment well    Behavior During Therapy WFL for tasks assessed/performed              Past Medical History:  Diagnosis Date   Arthritis    Back pain of lumbar region with sciatica    Cancer (HCC)    breast   Depression    Diabetes mellitus, type II (HCC)    Family history of anesthesia complication    grandfather died under anesthesia 70's- had heart issues   H/O carpal tunnel repair 2014   Headache(784.0)    Hyperlipidemia    Hypertension    Hypothyroidism    Medial meniscus tear    PONV (postoperative nausea and vomiting)    Sleep apnea    cpap since 10   Past Surgical History:  Procedure Laterality Date   BILATERAL TOTAL MASTECTOMY WITH AXILLARY LYMPH NODE DISSECTION     BREAST RECONSTRUCTION     CERVICAL DISC ARTHROPLASTY N/A 03/19/2013   Procedure: CERVICAL FIVE TO SIX, CERVICAL SIX TO SEVEN CERVICAL ANTERIOR DISC ARTHROPLASTY;  Surgeon: Barnett Abu, MD;  Location: [facility];  Service: Neurosurgery;  Laterality: N/A;  C56 C67 artificial disc replacement   CESAREAN SECTION     COLONOSCOPY     DILATATION & CURETTAGE/HYSTEROSCOPY WITH TRUECLEAR N/A 11/25/2013   Procedure: DILATATION & CURETTAGE/HYSTEROSCOPY WITH TRUCLEAR;  Surgeon: Meriel Pica, MD;  Location: [facility];  Service: Gynecology;  Laterality: N/A;   ECTOPIC PREGNANCY SURGERY     GALLBLADDER SURGERY     MOUTH SURGERY     child- dog bite   SPINAL FUSION     2023   STERIOD INJECTION Left 09/09/2022   Procedure: LEFT KNEE STEROID INJECTION;  Surgeon: Ollen Gross, MD;   Location: [facility];  Service: Orthopedics;  Laterality: Left;  left knee injection 0928   TONSILLECTOMY     TOTAL KNEE ARTHROPLASTY Right 09/09/2022   Procedure: TOTAL KNEE ARTHROPLASTY;  Surgeon: Ollen Gross, MD;  Location: [facility];  Service: Orthopedics;  Laterality: Right;   TUBAL LIGATION     Patient Active Problem List   Diagnosis Date Noted   OA (osteoarthritis) of knee 09/09/2022   Exertional dyspnea 04/17/2022   Elevated coronary artery calcium score 04/17/2022   Spondylolisthesis at L4-L5 level 12/27/2021   Ductal carcinoma in situ (DCIS) of left breast 01/19/2018   Generalized anxiety disorder 11/07/2017   Depression 11/07/2017   Insomnia 11/07/2017   Cervical spondylosis 03/19/2013    PCP: Creola Corn  REFERRING PROVIDER: Trudee Grip  REFERRING DIAG: R TKA 09/09/22  THERAPY DIAG:  Chronic bilateral back pain, unspecified back location  Muscle weakness (generalized)  S/P total knee arthroplasty, right  Other abnormalities of gait and mobility  Acute pain of right knee  Stiffness of right knee, not elsewhere classified  Rationale for Evaluation and Treatment: Rehabilitation  ONSET DATE: 09/09/22  SUBJECTIVE:   SUBJECTIVE STATEMENT :  Patient reports that she really has been having more back pain rated a 9/10 most of  the time.  Has appointment with back surgeon on 04/16/23 R TKA 8/19 L knee steroid injection 09/09/22  PAIN:  Are you having pain? Yes: NPRS scale: 9/10 Pain location: right low back, buttock and reports around to the front of the thigh Pain description: sharp really hurts Aggravating factors: difficulty sleeping, difficulty walking and standing and sitting Relieving factors: heat, Aleve , tramadol and methacarbomol  PRECAUTIONS: None  RED FLAGS: None   WEIGHT BEARING RESTRICTIONS: No  FALLS:  Has patient fallen in last 6 months? No  LIVING ENVIRONMENT: Lives with: lives with their family and lives with their spouse Lives in:  House/apartment Stairs: Yes: External: 3 steps; none Has following equipment at home: Single point cane and Walker - 2 wheeled  OCCUPATION: Not working  PLOF: Independent  PATIENT GOALS: less back pain  NEXT MD VISIT: 09/24/22  OBJECTIVE:   PATIENT SURVEYS:  FOTO 4  COGNITION: Overall cognitive status: Within functional limits for tasks assessed      EDEMA:  Increased swelling surrounding R knee  POSTURE: rounded shoulders and flexed trunk   PALPATION: Warm and TTP  LOWER EXTREMITY ROM:  Active ROM Right eval Left eval Right 09/24/22 Right 09/26/22 Right 10/02/22  11/01/22 12/09/22  Hip flexion          Hip extension          Hip abduction          Hip adduction          Hip internal rotation          Hip external rotation          Knee flexion  60  75* AROM, 86 PROM 82* AROM seated per pt request  78* self AAROM, 76* AROM  PROM 87 AROM 81 PROM 90 AROM 83 AROM 101 PROM 105  Knee extension -30  -15* AROM seated, -15 PROM supine   -16* supine heel prop with quad set  AROM 25 PROM 16 AROM 14 AROM 5 PROM 0  Ankle dorsiflexion          Ankle plantarflexion          Ankle inversion          Ankle eversion           (Blank rows = not tested)  LOWER EXTREMITY MMT:  MMT Right eval Left eval  Hip flexion 2   Hip extension    Hip abduction    Hip adduction    Hip internal rotation    Hip external rotation    Knee flexion 2   Knee extension 2   Ankle dorsiflexion    Ankle plantarflexion    Ankle inversion    Ankle eversion     (Blank rows = not tested)  LUMBAR ROM:  decreased 50% flexion, decreased 75% for extension and side bending, pain was worse with extension and right side bending. Hooklying is less painful that lying flat on back Mild tightness with pain during HS stretch, tight and painful piriformis bilaterally worse on the right , very weak core FUNCTIONAL TESTS:  5 times sit to stand: 30 seconds 03/20/23 Timed up and go (TUG): 1 min 28s  GAIT: Is  no without assistive device, mild antalgic on the right    TODAY'S TREATMENT:  DATE:  04/08/23 STM with the Tgun to the right low back and right buttock Feet on ball K2C, small rotation, small bridge and isometric abs Passive stretch to the LE's Very gentle manual sheet traction lumbar  MHP/IFC to the right low back in supine feet elevated  04/03/23 Nustep L 5 6 min Sitting on dyna disc pelvic ROM 10x for pelvic ROM and stab Sitting on dyna disc for stab LAQ,hip flex,hip abd 10 x each Sitting on dyna disc shld row and ext red tband 2 sets 10 Supine LE stretches- HS, LTR, SKTC, piriformis, glutes RT LE NT stretching-positive MH/IFC RT LB supine     04/01/23 Walk outdoors  NuStep L5x38mins  Seated row 20# 2x10 Lat pull down 20# 2x10 BlackTB ext 2x10 Supine LE stretches- HS, LTR, SKTC, piriformis, glutes    03/26/23 NuStep L5 x 6 min Bridges LE on pball bridges x5, K2C x10, Oblq   Passive stretching to HS, piriformis, ITB, Glute Shoulder Ext 5lb x10 Standing rows 5lb x12  03/20/23 Evaluation of the lumbar spine order from Dr. Danielle Dess  03/17/23 Gait outside negotiated curbs, back building stairs step over step, outside walking and then in grass, slope, uneven terrain On upside down bosu balance and reaching 40# resisted gait fwd and backward, some back pain with this Passive HS and piriformis stretches Green tband clamshells Feet on ball K2C, trunk rotation, small posterior activation, isometric abs Used Tgun and focused on the right low back and the right buttock for pain and spasms  03/13/23 Home visit: Stairs: in side are 7", has a banister on the left going up part way and then on right after a turn, the bottom part of the banister is not completely sturdy Entrances:  front door 8" step no railing,  Back door steps are 8" at top due to threshold,  but other stairs are 4"-7" with a railing on the left going up, we problem solved her turning toward the door jamb at the top of the stairs and using the door jamb to pull herself into the home, intead of the big step up and over. Bedroom entrance from outside, has railing on the right going up, 6"  Bathtub:  23" clearance, we worked on getting in and out using a couple of exercise mats as cushions, our best option was to sit on the edge of the tub, turn and bring feet in and slide or duck walk down into the tub.  Then to get out bend left knee up under her. roll to her left onto her knee and hand walk up and then step out Shower entrance is 8", would need to use walls to help get in and out of the shower   03/11/23 Resisted gait 30# with 6 inch step up 5 x rt, 5x left, 5 times each side- light touch needed with RT LE 6 inch step down 2 sets 10 with UE STS 14 inch box 3 sets 5 Wide BOS STS from mat with LLE out and cued for full TKE on RT 2 sets 10 Leg Press unlocked for ROM 2 sets 10 Leg Press 30# RT LE only 2 sets 10 AROM RT LE seated 0-112 Balance and ROM on BOS and rocker board  03/06/23 Gait outside, stairs 6" step over step up and down with single hand rail On bosu # reaching On Bosu ball toss On bosu small squats 50# resisted gait all directions Practice with her multiple ways to get down and off the floor,  using a quad fold, bi fold and single mat trying to simulate floor and garden bath tub  03/03/23 On wobble board balance On bosu reaching for numbers on wall On airex reaching for numbers on wall 6" steps with stretch Tmill push Passive stretch of the right knee, hip flexor and quad STM to the quad, back and buttock  PATIENT EDUCATION:  Education details: POC and HEP Person educated: Patient Education method: Explanation Education comprehension: verbalized understanding  HOME EXERCISE PROGRAM: Access Code: [facility] Access Code: [email or web address] URL:  [email or web address] Date: 03/20/2023 Prepared by: Stacie Glaze  Exercises - Hooklying Single Knee to Chest Stretch  - 2 x daily - 7 x weekly - 1 sets - 10 reps - 5 hold - Supine Double Knee to Chest  - 2 x daily - 7 x weekly - 1 sets - 10 reps - 5 hold - Supine Lower Trunk Rotation with PLB  - 2 x daily - 7 x weekly - 1 sets - 10 reps - 5 hold - Supine Posterior Pelvic Tilt  - 2 x daily - 7 x weekly - 1 sets - 10 reps - 3 hold  ASSESSMENT:  CLINICAL IMPRESSION:   Pt really hurting in the right low back and buttock today, she is unsure of why the pain continues to get worse, she has an appointment with the neuro surgeon next Wednesday.  I tried some STM to the low back and buttock, we also tried very gentle manual sheet traction and she seemed to get some good relief with this. She has a past fusion over a year ago so I would not do traction with machine OBJECTIVE IMPAIRMENTS: Abnormal gait, cardiopulmonary status limiting activity, decreased activity tolerance, decreased endurance, decreased mobility, difficulty walking, decreased ROM, decreased strength, increased muscle spasms, impaired flexibility, improper body mechanics, postural dysfunction, and pain.   REHAB POTENTIAL: Good  CLINICAL DECISION MAKING: Stable/uncomplicated  EVALUATION COMPLEXITY: Low   GOALS: Goals reviewed with patient? Yes  SHORT TERM GOALS: Target date: 10/24/22  Patient will be independent with initial HEP. Goal status: 09/20/22 met  2.  Patient will be do TUG < 30s Baseline: 1 min 28s  Goal status: ongoing 10/07/22, 18.57s MET 10/09/22  3.  Patient will be able to do 5 sit to stands  Baseline: unable to do  Goal status: progressing 10/04/22, MET 10/09/22   LONG TERM GOALS: Target date: 01/31/23  Patient will be independent with advanced/ongoing HEP to improve outcomes and carryover.  Goal status: ongoing 11/06/22  2.  Patient will report at least 75% improvement in R knee pain to  improve QOL. Baseline: 10/10 Goal status: met 02/03/23 60% better 11/20/22, 65% 12/06/22, 70% 12/17/22  3.  Patient will demonstrate improved R knee AROM to >/= 0-120 deg to allow for normal gait and stair mechanics. Baseline: 30-0-60 10/09/22: 16-0-78  11/01/22: 14-0-83 11/20/22: 10-97 12/06/22: 10-0-100 12/17/22: 8-0-104 Goal status: continue to progress and getting closer 0-112  03/13/23  4.  Patient will demonstrate improved functional LE strength as demonstrated by 5/5. Baseline: 2/5 Goal status: MET 3+/5 10/09/22, 5/5 11/01/22   5.  Patient will be able to ambulate 500' with LRAD and normal gait pattern without increased pain to access community.  Baseline: using RW small in home ambulation distances Goal status: progressing w/SPC 12/02/22, doing some walking without AD   6.  Patient will report 30 on FOTO (patient reported outcome measure) to demonstrate improved functional ability. Baseline: 4, 40-11/11/24 Goal status: MET 53 12/17/22  7.  Decrease lumbar pain 50%  Goal status: 04/03/23 on going  8.  Increase lumbar ROM 25%  Goal status: 04/03/23 no changes from eval  9.  Report able to put shoes and socks on without pain > 4/10  Goal status:   PLAN:  PT FREQUENCY: 2x/week  PT DURATION: 12 weeks  PLANNED INTERVENTIONS: Therapeutic exercises, Therapeutic activity, Neuromuscular re-education, Balance training, Gait training, Patient/Family education, Self Care, Joint mobilization, Stair training, Electrical stimulation, Cryotherapy, Moist heat, scar mobilization, Vasopneumatic device, and Manual therapy  PLAN FOR NEXT SESSION: Slowly try to address pain   Patient Details  Name: [name] MRN: [id number] Date of Birth: 04-17-1960 Referring Provider:  Barnett Abu, MD  Encounter Date: 04/08/2023   Jearld Lesch, PT 04/08/2023, 4:14 PM  C

## 2023-04-10 ENCOUNTER — Telehealth: Payer: Self-pay | Admitting: Cardiology

## 2023-04-10 ENCOUNTER — Ambulatory Visit: Payer: No Typology Code available for payment source | Admitting: Physical Therapy

## 2023-04-10 DIAGNOSIS — M6281 Muscle weakness (generalized): Secondary | ICD-10-CM

## 2023-04-10 DIAGNOSIS — G8929 Other chronic pain: Secondary | ICD-10-CM

## 2023-04-10 DIAGNOSIS — M549 Dorsalgia, unspecified: Secondary | ICD-10-CM | POA: Diagnosis not present

## 2023-04-10 NOTE — Therapy (Signed)
 OUTPATIENT PHYSICAL THERAPY LOWER EXTREMITY TREATMENT     Patient Name: [name] MRN: [id number] DOB:1960/10/24, 63 y.o., female Today's Date: 04/10/2023  END OF SESSION:  PT End of Session - 04/10/23 1533     Visit Number 71    Date for PT Re-Evaluation 05/18/23    Authorization Type [employer]    PT Start Time 1534    PT Stop Time 1615    PT Time Calculation (min) 41 min              Past Medical History:  Diagnosis Date   Arthritis    Back pain of lumbar region with sciatica    Cancer (HCC)    breast   Depression    Diabetes mellitus, type II (HCC)    Family history of anesthesia complication    grandfather died under anesthesia 70's- had heart issues   H/O carpal tunnel repair 2014   Headache(784.0)    Hyperlipidemia    Hypertension    Hypothyroidism    Medial meniscus tear    PONV (postoperative nausea and vomiting)    Sleep apnea    cpap since 10   Past Surgical History:  Procedure Laterality Date   BILATERAL TOTAL MASTECTOMY WITH AXILLARY LYMPH NODE DISSECTION     BREAST RECONSTRUCTION     CERVICAL DISC ARTHROPLASTY N/A 03/19/2013   Procedure: CERVICAL FIVE TO SIX, CERVICAL SIX TO SEVEN CERVICAL ANTERIOR DISC ARTHROPLASTY;  Surgeon: Barnett Abu, MD;  Location: [facility];  Service: Neurosurgery;  Laterality: N/A;  C56 C67 artificial disc replacement   CESAREAN SECTION     COLONOSCOPY     DILATATION & CURETTAGE/HYSTEROSCOPY WITH TRUECLEAR N/A 11/25/2013   Procedure: DILATATION & CURETTAGE/HYSTEROSCOPY WITH TRUCLEAR;  Surgeon: Meriel Pica, MD;  Location: [facility];  Service: Gynecology;  Laterality: N/A;   ECTOPIC PREGNANCY SURGERY     GALLBLADDER SURGERY     MOUTH SURGERY     child- dog bite   SPINAL FUSION     2023   STERIOD INJECTION Left 09/09/2022   Procedure: LEFT KNEE STEROID INJECTION;  Surgeon: Ollen Gross, MD;  Location: [facility];  Service: Orthopedics;  Laterality: Left;  left knee injection 0928   TONSILLECTOMY     TOTAL KNEE  ARTHROPLASTY Right 09/09/2022   Procedure: TOTAL KNEE ARTHROPLASTY;  Surgeon: Ollen Gross, MD;  Location: [facility];  Service: Orthopedics;  Laterality: Right;   TUBAL LIGATION     Patient Active Problem List   Diagnosis Date Noted   OA (osteoarthritis) of knee 09/09/2022   Exertional dyspnea 04/17/2022   Elevated coronary artery calcium score 04/17/2022   Spondylolisthesis at L4-L5 level 12/27/2021   Ductal carcinoma in situ (DCIS) of left breast 01/19/2018   Generalized anxiety disorder 11/07/2017   Depression 11/07/2017   Insomnia 11/07/2017   Cervical spondylosis 03/19/2013    PCP: Creola Corn  REFERRING PROVIDER: Trudee Grip  REFERRING DIAG: R TKA 09/09/22  THERAPY DIAG:  Chronic bilateral back pain, unspecified back location  Muscle weakness (generalized)  Rationale for Evaluation and Treatment: Rehabilitation  ONSET DATE: 09/09/22  SUBJECTIVE:   SUBJECTIVE STATEMENT :   Hurting- using TENS unit at home and tried essential oils. Worse as day goes on, hard to even get in and out of bed.sheet traction is first time I felt relief lasted through the night    R TKA 8/19 L knee steroid injection 09/09/22  PAIN:  Are you having pain? Yes: NPRS scale: 9/10 Pain location: right low  back, buttock and reports around to the front of the thigh Pain description: sharp really hurts Aggravating factors: difficulty sleeping, difficulty walking and standing and sitting Relieving factors: heat, Aleve , tramadol and methacarbomol  PRECAUTIONS: None  RED FLAGS: None   WEIGHT BEARING RESTRICTIONS: No  FALLS:  Has patient fallen in last 6 months? No  LIVING ENVIRONMENT: Lives with: lives with their family and lives with their spouse Lives in: House/apartment Stairs: Yes: External: 3 steps; none Has following equipment at home: Single point cane and Walker - 2 wheeled  OCCUPATION: Not working  PLOF: Independent  PATIENT GOALS: less back pain  NEXT MD VISIT:  09/24/22  OBJECTIVE:   PATIENT SURVEYS:  FOTO 4  COGNITION: Overall cognitive status: Within functional limits for tasks assessed      EDEMA:  Increased swelling surrounding R knee  POSTURE: rounded shoulders and flexed trunk   PALPATION: Warm and TTP  LOWER EXTREMITY ROM:  Active ROM Right eval Left eval Right 09/24/22 Right 09/26/22 Right 10/02/22  11/01/22 12/09/22  Hip flexion          Hip extension          Hip abduction          Hip adduction          Hip internal rotation          Hip external rotation          Knee flexion  60  75* AROM, 86 PROM 82* AROM seated per pt request  78* self AAROM, 76* AROM  PROM 87 AROM 81 PROM 90 AROM 83 AROM 101 PROM 105  Knee extension -30  -15* AROM seated, -15 PROM supine   -16* supine heel prop with quad set  AROM 25 PROM 16 AROM 14 AROM 5 PROM 0  Ankle dorsiflexion          Ankle plantarflexion          Ankle inversion          Ankle eversion           (Blank rows = not tested)  LOWER EXTREMITY MMT:  MMT Right eval Left eval  Hip flexion 2   Hip extension    Hip abduction    Hip adduction    Hip internal rotation    Hip external rotation    Knee flexion 2   Knee extension 2   Ankle dorsiflexion    Ankle plantarflexion    Ankle inversion    Ankle eversion     (Blank rows = not tested)  LUMBAR ROM:  decreased 50% flexion, decreased 75% for extension and side bending, pain was worse with extension and right side bending. Hooklying is less painful that lying flat on back Mild tightness with pain during HS stretch, tight and painful piriformis bilaterally worse on the right , very weak core FUNCTIONAL TESTS:  5 times sit to stand: 30 seconds 03/20/23 Timed up and go (TUG): 1 min 28s  GAIT: Is no without assistive device, mild antalgic on the right    TODAY'S TREATMENT:  DATE:    04/10/23  Sit ft pelvic ROM 4 way s 15 x each Nustep L 5 Red tband seated trunk ext 2 sets 10,10 x each trunk rotation Iso abs with ball 10 x hold 3 sec Feet on ball bridge, KTC and obl ADD ball squeeze Clams and hip flex red tband 2 sets 10 Passive stretch to the LE's Very gentle manual sheet traction lumbar     04/08/23 STM with the Tgun to the right low back and right buttock Feet on ball K2C, small rotation, small bridge and isometric abs Passive stretch to the LE's Very gentle manual sheet traction lumbar  MHP/IFC to the right low back in supine feet elevated  04/03/23 Nustep L 5 6 min Sitting on dyna disc pelvic ROM 10x for pelvic ROM and stab Sitting on dyna disc for stab LAQ,hip flex,hip abd 10 x each Sitting on dyna disc shld row and ext red tband 2 sets 10 Supine LE stretches- HS, LTR, SKTC, piriformis, glutes RT LE NT stretching-positive MH/IFC RT LB supine     04/01/23 Walk outdoors  NuStep L5x66mins  Seated row 20# 2x10 Lat pull down 20# 2x10 BlackTB ext 2x10 Supine LE stretches- HS, LTR, SKTC, piriformis, glutes    03/26/23 NuStep L5 x 6 min Bridges LE on pball bridges x5, K2C x10, Oblq   Passive stretching to HS, piriformis, ITB, Glute Shoulder Ext 5lb x10 Standing rows 5lb x12  03/20/23 Evaluation of the lumbar spine order from Dr. Danielle Dess  03/17/23 Gait outside negotiated curbs, back building stairs step over step, outside walking and then in grass, slope, uneven terrain On upside down bosu balance and reaching 40# resisted gait fwd and backward, some back pain with this Passive HS and piriformis stretches Green tband clamshells Feet on ball K2C, trunk rotation, small posterior activation, isometric abs Used Tgun and focused on the right low back and the right buttock for pain and spasms  03/13/23 Home visit: Stairs: in side are 7", has a banister on the left going up part way and then on right after a turn, the bottom part of the  banister is not completely sturdy Entrances:  front door 8" step no railing,  Back door steps are 8" at top due to threshold, but other stairs are 4"-7" with a railing on the left going up, we problem solved her turning toward the door jamb at the top of the stairs and using the door jamb to pull herself into the home, intead of the big step up and over. Bedroom entrance from outside, has railing on the right going up, 6"  Bathtub:  23" clearance, we worked on getting in and out using a couple of exercise mats as cushions, our best option was to sit on the edge of the tub, turn and bring feet in and slide or duck walk down into the tub.  Then to get out bend left knee up under her. roll to her left onto her knee and hand walk up and then step out Shower entrance is 8", would need to use walls to help get in and out of the shower   03/11/23 Resisted gait 30# with 6 inch step up 5 x rt, 5x left, 5 times each side- light touch needed with RT LE 6 inch step down 2 sets 10 with UE STS 14 inch box 3 sets 5 Wide BOS STS from mat with LLE out and cued for full TKE on RT 2 sets 10 Leg Press  unlocked for ROM 2 sets 10 Leg Press 30# RT LE only 2 sets 10 AROM RT LE seated 0-112 Balance and ROM on BOS and rocker board  03/06/23 Gait outside, stairs 6" step over step up and down with single hand rail On bosu # reaching On Bosu ball toss On bosu small squats 50# resisted gait all directions Practice with her multiple ways to get down and off the floor, using a quad fold, bi fold and single mat trying to simulate floor and garden bath tub  03/03/23 On wobble board balance On bosu reaching for numbers on wall On airex reaching for numbers on wall 6" steps with stretch Tmill push Passive stretch of the right knee, hip flexor and quad STM to the quad, back and buttock  PATIENT EDUCATION:  Education details: POC and HEP Person educated: Patient Education method: Explanation Education comprehension:  verbalized understanding  HOME EXERCISE PROGRAM: Access Code: [facility] Access Code: [email or web address] URL: [email or web address] Date: 03/20/2023 Prepared by: Stacie Glaze  Exercises - Hooklying Single Knee to Chest Stretch  - 2 x daily - 7 x weekly - 1 sets - 10 reps - 5 hold - Supine Double Knee to Chest  - 2 x daily - 7 x weekly - 1 sets - 10 reps - 5 hold - Supine Lower Trunk Rotation with PLB  - 2 x daily - 7 x weekly - 1 sets - 10 reps - 5 hold - Supine Posterior Pelvic Tilt  - 2 x daily - 7 x weekly - 1 sets - 10 reps - 3 hold  ASSESSMENT:  CLINICAL IMPRESSION:   Pt really hurting in the right low back and buttock again today, Seeing MD next week. Did state sheet tratcion helped and gave relief through even, after discussing with PT decided to hold on mechanical until after seeing MD. Tried to get pt moving and work  on stab with cuing. No goals met d/t pain. OBJECTIVE IMPAIRMENTS: Abnormal gait, cardiopulmonary status limiting activity, decreased activity tolerance, decreased endurance, decreased mobility, difficulty walking, decreased ROM, decreased strength, increased muscle spasms, impaired flexibility, improper body mechanics, postural dysfunction, and pain.   REHAB POTENTIAL: Good  CLINICAL DECISION MAKING: Stable/uncomplicated  EVALUATION COMPLEXITY: Low   GOALS: Goals reviewed with patient? Yes  SHORT TERM GOALS: Target date: 10/24/22  Patient will be independent with initial HEP. Goal status: 09/20/22 met  2.  Patient will be do TUG < 30s Baseline: 1 min 28s  Goal status: ongoing 10/07/22, 18.57s MET 10/09/22  3.  Patient will be able to do 5 sit to stands  Baseline: unable to do  Goal status: progressing 10/04/22, MET 10/09/22   LONG TERM GOALS: Target date: 01/31/23  Patient will be independent with advanced/ongoing HEP to improve outcomes and carryover.  Goal status: ongoing 11/06/22  2.  Patient will report at least 75% improvement in R knee  pain to improve QOL. Baseline: 10/10 Goal status: met 02/03/23 60% better 11/20/22, 65% 12/06/22, 70% 12/17/22  3.  Patient will demonstrate improved R knee AROM to >/= 0-120 deg to allow for normal gait and stair mechanics. Baseline: 30-0-60 10/09/22: 16-0-78  11/01/22: 14-0-83 11/20/22: 10-97 12/06/22: 10-0-100 12/17/22: 8-0-104 Goal status: continue to progress and getting closer 0-112  03/13/23  4.  Patient will demonstrate improved functional LE strength as demonstrated by 5/5. Baseline: 2/5 Goal status: MET 3+/5 10/09/22, 5/5 11/01/22   5.  Patient will be able to ambulate 500' with LRAD and normal gait pattern without increased pain  to access community.  Baseline: using RW small in home ambulation distances Goal status: progressing w/SPC 12/02/22, doing some walking without AD   6.  Patient will report 56 on FOTO (patient reported outcome measure) to demonstrate improved functional ability. Baseline: 4, 40-11/11/24 Goal status: MET 53 12/17/22  7.  Decrease lumbar pain 50%  Goal status: 04/03/23 on going  8.  Increase lumbar ROM 25%  Goal status: 04/03/23 no changes from eval  9.  Report able to put shoes and socks on without pain > 4/10  Goal status:   PLAN:  PT FREQUENCY: 2x/week  PT DURATION: 12 weeks  PLANNED INTERVENTIONS: Therapeutic exercises, Therapeutic activity, Neuromuscular re-education, Balance training, Gait training, Patient/Family education, Self Care, Joint mobilization, Stair training, Electrical stimulation, Cryotherapy, Moist heat, scar mobilization, Vasopneumatic device, and Manual therapy  PLAN FOR NEXT SESSION: Slowly try to address pain   Patient Details  Name: FILIPPA YARBOUGH MRN: [id number] Date of Birth: 1960-07-25 Referring Provider:  Barnett Abu, MD  Encounter Date: 04/10/2023   Suanne Marker, PTA 04/10/2023, 3:34 PM  [facility] [address] [place], [place],  [address] Phone: [phone]   Fax:  [phone]  Patient Details  Name: TYJAI CHARBONNET MRN: [id number] Date of Birth: 23-Sep-1960 Referring Provider:  Barnett Abu, MD  Encounter Date: 04/10/2023   Suanne Marker, PTA 04/10/2023, 3:34 PM  [facility] [address] [place], [place], [address] Phone: [phone]   Fax:  [phone]

## 2023-04-10 NOTE — Telephone Encounter (Signed)
 Pt c/o medication issue:  1. Name of Medication:   metoprolol succinate (TOPROL-XL) 100 MG 24 hr tablet    2. How are you currently taking this medication (dosage and times per day)?  Take 100 mg by mouth at bedtime.      3. Are you having a reaction (difficulty breathing--STAT)? No  4. What is your medication issue? Pt is requesting a callback regarding her wanting to know if she can stop taking this medication per ENT and pharmd at [facility] advising so. Please advise.

## 2023-04-10 NOTE — Telephone Encounter (Signed)
 Spoke with patient and she stated she saw ENT and [facility] PharmD and they recommended she come off metoprolol. ENT is recommending her allergy drops and she can not be on metoprolol. She states when she saw PharmD they stated its not really used for BP anymore. They don't think she needs it anymore.  She states she has never had BP issues. Only when she was pregnant She was advised to give cardiologist a call

## 2023-04-14 NOTE — Telephone Encounter (Signed)
 Agree, okay to come off metoprolol.  Thanks MJP

## 2023-04-14 NOTE — Telephone Encounter (Signed)
 Patient contacted and advised. Pt agrees with plan of care.

## 2023-04-15 ENCOUNTER — Encounter: Payer: Self-pay | Admitting: Professional Counselor

## 2023-04-15 ENCOUNTER — Ambulatory Visit: Payer: No Typology Code available for payment source | Admitting: Professional Counselor

## 2023-04-15 ENCOUNTER — Encounter: Payer: Self-pay | Admitting: Physical Therapy

## 2023-04-15 ENCOUNTER — Ambulatory Visit: Payer: No Typology Code available for payment source | Admitting: Physical Therapy

## 2023-04-15 DIAGNOSIS — F411 Generalized anxiety disorder: Secondary | ICD-10-CM | POA: Diagnosis not present

## 2023-04-15 DIAGNOSIS — G8929 Other chronic pain: Secondary | ICD-10-CM

## 2023-04-15 DIAGNOSIS — R2689 Other abnormalities of gait and mobility: Secondary | ICD-10-CM

## 2023-04-15 DIAGNOSIS — F331 Major depressive disorder, recurrent, moderate: Secondary | ICD-10-CM | POA: Diagnosis not present

## 2023-04-15 DIAGNOSIS — M6281 Muscle weakness (generalized): Secondary | ICD-10-CM

## 2023-04-15 DIAGNOSIS — M549 Dorsalgia, unspecified: Secondary | ICD-10-CM | POA: Diagnosis not present

## 2023-04-15 DIAGNOSIS — M25561 Pain in right knee: Secondary | ICD-10-CM

## 2023-04-15 DIAGNOSIS — Z96651 Presence of right artificial knee joint: Secondary | ICD-10-CM

## 2023-04-15 NOTE — Therapy (Signed)
 OUTPATIENT PHYSICAL THERAPY LOWER EXTREMITY TREATMENT     Patient Name: [name] MRN: [id number] DOB:[birth year], 63 y.o., female Today's Date: 04/15/2023  END OF SESSION:  PT End of Session - 04/15/23 1403     Visit Number 72    Date for PT Re-Evaluation 05/18/23    Authorization Type [employer]    PT Start Time 1400    PT Stop Time 1445    PT Time Calculation (min) 45 min    Activity Tolerance Patient tolerated treatment well    Behavior During Therapy WFL for tasks assessed/performed              Past Medical History:  Diagnosis Date   Arthritis    Back pain of lumbar region with sciatica    Cancer (HCC)    breast   Depression    Diabetes mellitus, type II (HCC)    Family history of anesthesia complication    grandfather died under anesthesia 70's- had heart issues   H/O carpal tunnel repair 2014   Headache(784.0)    Hyperlipidemia    Hypertension    Hypothyroidism    Medial meniscus tear    PONV (postoperative nausea and vomiting)    Sleep apnea    cpap since 10   Past Surgical History:  Procedure Laterality Date   BILATERAL TOTAL MASTECTOMY WITH AXILLARY LYMPH NODE DISSECTION     BREAST RECONSTRUCTION     CERVICAL DISC ARTHROPLASTY N/A 03/19/2013   Procedure: CERVICAL FIVE TO SIX, CERVICAL SIX TO SEVEN CERVICAL ANTERIOR DISC ARTHROPLASTY;  Surgeon: Barnett Abu, MD;  Location: [facility];  Service: Neurosurgery;  Laterality: N/A;  C56 C67 artificial disc replacement   CESAREAN SECTION     COLONOSCOPY     DILATATION & CURETTAGE/HYSTEROSCOPY WITH TRUECLEAR N/A 11/25/2013   Procedure: DILATATION & CURETTAGE/HYSTEROSCOPY WITH TRUCLEAR;  Surgeon: Meriel Pica, MD;  Location: [facility];  Service: Gynecology;  Laterality: N/A;   ECTOPIC PREGNANCY SURGERY     GALLBLADDER SURGERY     MOUTH SURGERY     child- dog bite   SPINAL FUSION     2023   STERIOD INJECTION Left 09/09/2022   Procedure: LEFT KNEE STEROID INJECTION;  Surgeon: Ollen Gross, MD;   Location: [facility];  Service: Orthopedics;  Laterality: Left;  left knee injection 0928   TONSILLECTOMY     TOTAL KNEE ARTHROPLASTY Right 09/09/2022   Procedure: TOTAL KNEE ARTHROPLASTY;  Surgeon: Ollen Gross, MD;  Location: [facility];  Service: Orthopedics;  Laterality: Right;   TUBAL LIGATION     Patient Active Problem List   Diagnosis Date Noted   OA (osteoarthritis) of knee 09/09/2022   Exertional dyspnea 04/17/2022   Elevated coronary artery calcium score 04/17/2022   Spondylolisthesis at L4-L5 level 12/27/2021   Ductal carcinoma in situ (DCIS) of left breast 01/19/2018   Generalized anxiety disorder 11/07/2017   Depression 11/07/2017   Insomnia 11/07/2017   Cervical spondylosis 03/19/2013    PCP: Creola Corn  REFERRING PROVIDER: Trudee Grip  REFERRING DIAG: R TKA 09/09/22  THERAPY DIAG:  Chronic bilateral back pain, unspecified back location  Muscle weakness (generalized)  S/P total knee arthroplasty, right  Other abnormalities of gait and mobility  Acute pain of right knee  Rationale for Evaluation and Treatment: Rehabilitation  ONSET DATE: 09/09/22  SUBJECTIVE:   SUBJECTIVE STATEMENT :  Patient reports that she really still has a lot of pain in her back, she reports that she had multiple appointments today and so  she has been very busy, pain in the right low back up to 8/10 , has taken Tramadol today due to the pain   R TKA 8/19 L knee steroid injection 09/09/22  PAIN:  Are you having pain? Yes: NPRS scale: 9/10 Pain location: right low back, buttock and reports around to the front of the thigh Pain description: sharp really hurts Aggravating factors: difficulty sleeping, difficulty walking and standing and sitting Relieving factors: heat, Aleve , tramadol and methacarbomol  PRECAUTIONS: None  RED FLAGS: None   WEIGHT BEARING RESTRICTIONS: No  FALLS:  Has patient fallen in last 6 months? No  LIVING ENVIRONMENT: Lives with: lives with their family  and lives with their spouse Lives in: House/apartment Stairs: Yes: External: 3 steps; none Has following equipment at home: Single point cane and Walker - 2 wheeled  OCCUPATION: Not working  PLOF: Independent  PATIENT GOALS: less back pain  NEXT MD VISIT: 09/24/22  OBJECTIVE:   PATIENT SURVEYS:  FOTO 4  COGNITION: Overall cognitive status: Within functional limits for tasks assessed      EDEMA:  Increased swelling surrounding R knee  POSTURE: rounded shoulders and flexed trunk   PALPATION: Warm and TTP  LOWER EXTREMITY ROM:  Active ROM Right eval Left eval Right 09/24/22 Right 09/26/22 Right 10/02/22  11/01/22 12/09/22  Hip flexion          Hip extension          Hip abduction          Hip adduction          Hip internal rotation          Hip external rotation          Knee flexion  60  75* AROM, 86 PROM 82* AROM seated per pt request  78* self AAROM, 76* AROM  PROM 87 AROM 81 PROM 90 AROM 83 AROM 101 PROM 105  Knee extension -30  -15* AROM seated, -15 PROM supine   -16* supine heel prop with quad set  AROM 25 PROM 16 AROM 14 AROM 5 PROM 0  Ankle dorsiflexion          Ankle plantarflexion          Ankle inversion          Ankle eversion           (Blank rows = not tested)  LOWER EXTREMITY MMT:  MMT Right eval Left eval  Hip flexion 2   Hip extension    Hip abduction    Hip adduction    Hip internal rotation    Hip external rotation    Knee flexion 2   Knee extension 2   Ankle dorsiflexion    Ankle plantarflexion    Ankle inversion    Ankle eversion     (Blank rows = not tested)  LUMBAR ROM:  decreased 50% flexion, decreased 75% for extension and side bending, pain was worse with extension and right side bending. Hooklying is less painful that lying flat on back Mild tightness with pain during HS stretch, tight and painful piriformis bilaterally worse on the right , very weak core FUNCTIONAL TESTS:  5 times sit to stand: 30 seconds 03/20/23 Timed  up and go (TUG): 1 min 28s  GAIT: Is no without assistive device, mild antalgic on the right    TODAY'S TREATMENT:  DATE:  04/15/23 Nustep level 5 x 6 minutes Bike level 3 x 5 minutes Leg press 20# and no weight  5# straight arm pulls STM to the right low back and buttock Gentle manual pelvic traction with sheet  04/10/23  Sit ft pelvic ROM 4 way s 15 x each Nustep L 5 Red tband seated trunk ext 2 sets 10,10 x each trunk rotation Iso abs with ball 10 x hold 3 sec Feet on ball bridge, KTC and obl ADD ball squeeze Clams and hip flex red tband 2 sets 10 Passive stretch to the LE's Very gentle manual sheet traction lumbar     04/08/23 STM with the Tgun to the right low back and right buttock Feet on ball K2C, small rotation, small bridge and isometric abs Passive stretch to the LE's Very gentle manual sheet traction lumbar  MHP/IFC to the right low back in supine feet elevated  04/03/23 Nustep L 5 6 min Sitting on dyna disc pelvic ROM 10x for pelvic ROM and stab Sitting on dyna disc for stab LAQ,hip flex,hip abd 10 x each Sitting on dyna disc shld row and ext red tband 2 sets 10 Supine LE stretches- HS, LTR, SKTC, piriformis, glutes RT LE NT stretching-positive MH/IFC RT LB supine     04/01/23 Walk outdoors  NuStep L5x46mins  Seated row 20# 2x10 Lat pull down 20# 2x10 BlackTB ext 2x10 Supine LE stretches- HS, LTR, SKTC, piriformis, glutes    03/26/23 NuStep L5 x 6 min Bridges LE on pball bridges x5, K2C x10, Oblq   Passive stretching to HS, piriformis, ITB, Glute Shoulder Ext 5lb x10 Standing rows 5lb x12  03/20/23 Evaluation of the lumbar spine order from Dr. Danielle Dess  03/17/23 Gait outside negotiated curbs, back building stairs step over step, outside walking and then in grass, slope, uneven terrain On upside down bosu balance and  reaching 40# resisted gait fwd and backward, some back pain with this Passive HS and piriformis stretches Green tband clamshells Feet on ball K2C, trunk rotation, small posterior activation, isometric abs Used Tgun and focused on the right low back and the right buttock for pain and spasms  03/13/23 Home visit: Stairs: in side are 7", has a banister on the left going up part way and then on right after a turn, the bottom part of the banister is not completely sturdy Entrances:  front door 8" step no railing,  Back door steps are 8" at top due to threshold, but other stairs are 4"-7" with a railing on the left going up, we problem solved her turning toward the door jamb at the top of the stairs and using the door jamb to pull herself into the home, intead of the big step up and over. Bedroom entrance from outside, has railing on the right going up, 6"  Bathtub:  23" clearance, we worked on getting in and out using a couple of exercise mats as cushions, our best option was to sit on the edge of the tub, turn and bring feet in and slide or duck walk down into the tub.  Then to get out bend left knee up under her. roll to her left onto her knee and hand walk up and then step out Shower entrance is 8", would need to use walls to help get in and out of the shower   03/11/23 Resisted gait 30# with 6 inch step up 5 x rt, 5x left, 5 times each side- light touch needed with RT  LE 6 inch step down 2 sets 10 with UE STS 14 inch box 3 sets 5 Wide BOS STS from mat with LLE out and cued for full TKE on RT 2 sets 10 Leg Press unlocked for ROM 2 sets 10 Leg Press 30# RT LE only 2 sets 10 AROM RT LE seated 0-112 Balance and ROM on BOS and rocker board  03/06/23 Gait outside, stairs 6" step over step up and down with single hand rail On bosu # reaching On Bosu ball toss On bosu small squats 50# resisted gait all directions Practice with her multiple ways to get down and off the floor, using a quad fold, bi  fold and single mat trying to simulate floor and garden bath tub   PATIENT EDUCATION:  Education details: POC and HEP Person educated: Patient Education method: Explanation Education comprehension: verbalized understanding  HOME EXERCISE PROGRAM: Access Code: [facility] Access Code: [email or web address] URL: [email or web address] Date: 03/20/2023 Prepared by: Stacie Glaze  Exercises - Hooklying Single Knee to Chest Stretch  - 2 x daily - 7 x weekly - 1 sets - 10 reps - 5 hold - Supine Double Knee to Chest  - 2 x daily - 7 x weekly - 1 sets - 10 reps - 5 hold - Supine Lower Trunk Rotation with PLB  - 2 x daily - 7 x weekly - 1 sets - 10 reps - 5 hold - Supine Posterior Pelvic Tilt  - 2 x daily - 7 x weekly - 1 sets - 10 reps - 3 hold  ASSESSMENT:  CLINICAL IMPRESSION:   Pt really hurting in the right low back and buttock, Seeing MD tomorrow. Did state sheet tratcion helped and gave relief. Tried to get pt moving and work  on stab with cuing. No goals met d/t pain.  She does seem to have maintained her knee ROM as she was able to go around full revolutions without trunk lean. OBJECTIVE IMPAIRMENTS: Abnormal gait, cardiopulmonary status limiting activity, decreased activity tolerance, decreased endurance, decreased mobility, difficulty walking, decreased ROM, decreased strength, increased muscle spasms, impaired flexibility, improper body mechanics, postural dysfunction, and pain.   REHAB POTENTIAL: Good  CLINICAL DECISION MAKING: Stable/uncomplicated  EVALUATION COMPLEXITY: Low   GOALS: Goals reviewed with patient? Yes  SHORT TERM GOALS: Target date: 10/24/22  Patient will be independent with initial HEP. Goal status: 09/20/22 met  2.  Patient will be do TUG < 30s Baseline: 1 min 28s  Goal status: ongoing 10/07/22, 18.57s MET 10/09/22  3.  Patient will be able to do 5 sit to stands  Baseline: unable to do  Goal status: progressing 10/04/22, MET 10/09/22   LONG TERM  GOALS: Target date: 01/31/23  Patient will be independent with advanced/ongoing HEP to improve outcomes and carryover.  Goal status: ongoing 11/06/22  2.  Patient will report at least 75% improvement in R knee pain to improve QOL. Baseline: 10/10 Goal status: met 02/03/23 60% better 11/20/22, 65% 12/06/22, 70% 12/17/22  3.  Patient will demonstrate improved R knee AROM to >/= 0-120 deg to allow for normal gait and stair mechanics. Baseline: 30-0-60 10/09/22: 16-0-78  11/01/22: 14-0-83 11/20/22: 10-97 12/06/22: 10-0-100 12/17/22: 8-0-104 Goal status: continue to progress and getting closer 0-112  03/13/23  4.  Patient will demonstrate improved functional LE strength as demonstrated by 5/5. Baseline: 2/5 Goal status: MET 3+/5 10/09/22, 5/5 11/01/22   5.  Patient will be able to ambulate 500' with LRAD and normal gait pattern without increased pain to  access community.  Baseline: using RW small in home ambulation distances Goal status: progressing w/SPC 12/02/22, doing some walking without AD   6.  Patient will report 33 on FOTO (patient reported outcome measure) to demonstrate improved functional ability. Baseline: 4, 40-11/11/24 Goal status: MET 53 12/17/22  7.  Decrease lumbar pain 50%  Goal status: 04/03/23 on going  8.  Increase lumbar ROM 25%  Goal status: 04/03/23 no changes from eval  9.  Report able to put shoes and socks on without pain > 4/10  Goal status:   PLAN:  PT FREQUENCY: 2x/week  PT DURATION: 12 weeks  PLANNED INTERVENTIONS: Therapeutic exercises, Therapeutic activity, Neuromuscular re-education, Balance training, Gait training, Patient/Family education, Self Care, Joint mobilization, Stair training, Electrical stimulation, Cryotherapy, Moist heat, scar mobilization, Vasopneumatic device, and Manual therapy  PLAN FOR NEXT SESSION: Will see what the MD thinks of the pain   Patient Details  Name: BELLAMIA FERCH MRN: [id number] Date of Birth:  [birth year] Referring Provider:  Barnett Abu, MD  Encounter Date: 04/15/2023   Jearld Lesch, PT 04/15/2023, 2:04 PM

## 2023-04-15 NOTE — Progress Notes (Signed)
      [facility] Counselor/Therapist Progress Note  Patient ID: [name], MRN: [id number],    Date: 05/10/2023  Time Spent: 4:04 PM to 5:12 PM  Treatment Type: Family with patient  Patient was present with spouse for session.  Reported Symptoms: Worries, fatigue, stress, interpersonal concerns, sense of hopelessness, frustration, restlessness, fears  Mental Status Exam:  Appearance:   Neat     Behavior:  Appropriate, Sharing, and Motivated  Motor:  Normal  Speech/Language:   Clear and Coherent and Normal Rate  Affect:  Appropriate and Congruent  Mood:  normal  Thought process:  normal  Thought content:    WNL  Sensory/Perceptual disturbances:    WNL  Orientation:  oriented to person, place, time/date, and situation  Attention:  Good  Concentration:  Good  Memory:  WNL  Fund of knowledge:   Good  Insight:    Good  Judgment:   Good  Impulse Control:  Good   Risk Assessment: Danger to Self:  No Self-injurious Behavior: No Danger to Others: No Duty to Warn:no Physical Aggression / Violence:No  Access to Firearms a concern: No  Gang Involvement:No   Subjective: Patient presented to session to address concerns of anxiety depression.  She reported limited progress at this time.  Patient spouse present for session.  Patient processed experience of conflict episode since last session and unfolding events, and how she coped.  She and spouse identified spouse having slept elsewhere that evening, and that while they reconciled for the time being, how they are both feeling hurt and frustrated and a sense of low hope.  Counselor held space for patient and spouse processing and helped and encouraged couple to resource emotional regulation skills, and assisted with strategies to this end.  They discussed setting a timer and observing silence during prescribed times, in order to resource restraint from engaging in conflict.  Counselor assisted patient and spouse in resourcing  contemplative life in marriage, and facilitated a narrative therapy exercise which both engaged in and found useful in their reflection and in honoring 1 and others stories and perspectives.  Counselor encouraged couple to practice attunement, quiet and/or silence in every day ways as well, such as cooking together, doing chores, sitting together.  Interventions: Solution-Oriented/Positive Psychology, Humanistic/Existential, Narrative, Insight-Oriented, and Family Systems, Spiritually-Integrated Psychotherapy  Diagnosis:   ICD-10-CM   1. Generalized anxiety disorder  F41.1     2. Major depressive disorder, recurrent episode, moderate (HCC)  F33.1       Plan: Patient is scheduled for follow-up; continue process work and developing coping skills.  Short-term goal between sessions to practice emotional dysregulation skills, resource silence between 1 another, and stillness internally.  Progress note was dictated with Dragon and reviewed for accuracy.  Anthon Kins, [facility]

## 2023-04-17 ENCOUNTER — Encounter: Payer: Self-pay | Admitting: Physical Therapy

## 2023-04-17 ENCOUNTER — Ambulatory Visit: Payer: No Typology Code available for payment source | Admitting: Physical Therapy

## 2023-04-17 DIAGNOSIS — R2689 Other abnormalities of gait and mobility: Secondary | ICD-10-CM

## 2023-04-17 DIAGNOSIS — M549 Dorsalgia, unspecified: Secondary | ICD-10-CM | POA: Diagnosis not present

## 2023-04-17 DIAGNOSIS — M25561 Pain in right knee: Secondary | ICD-10-CM

## 2023-04-17 DIAGNOSIS — G8929 Other chronic pain: Secondary | ICD-10-CM

## 2023-04-17 DIAGNOSIS — Z96651 Presence of right artificial knee joint: Secondary | ICD-10-CM

## 2023-04-17 DIAGNOSIS — M25661 Stiffness of right knee, not elsewhere classified: Secondary | ICD-10-CM

## 2023-04-17 DIAGNOSIS — M6281 Muscle weakness (generalized): Secondary | ICD-10-CM

## 2023-04-17 NOTE — Therapy (Signed)
 OUTPATIENT PHYSICAL THERAPY LOWER EXTREMITY TREATMENT     Patient Name: [name] MRN: [id number] DOB:1961/01/13, 63 y.o., female Today's Date: 04/17/2023  END OF SESSION:  PT End of Session - 04/17/23 1311     Visit Number 73    Date for PT Re-Evaluation 05/18/23    Authorization Type [employer]    PT Start Time 1310    PT Stop Time 1400    PT Time Calculation (min) 50 min    Activity Tolerance Patient tolerated treatment well    Behavior During Therapy WFL for tasks assessed/performed              Past Medical History:  Diagnosis Date   Arthritis    Back pain of lumbar region with sciatica    Cancer (HCC)    breast   Depression    Diabetes mellitus, type II (HCC)    Family history of anesthesia complication    grandfather died under anesthesia 70's- had heart issues   H/O carpal tunnel repair 2014   Headache(784.0)    Hyperlipidemia    Hypertension    Hypothyroidism    Medial meniscus tear    PONV (postoperative nausea and vomiting)    Sleep apnea    cpap since 10   Past Surgical History:  Procedure Laterality Date   BILATERAL TOTAL MASTECTOMY WITH AXILLARY LYMPH NODE DISSECTION     BREAST RECONSTRUCTION     CERVICAL DISC ARTHROPLASTY N/A 03/19/2013   Procedure: CERVICAL FIVE TO SIX, CERVICAL SIX TO SEVEN CERVICAL ANTERIOR DISC ARTHROPLASTY;  Surgeon: Barnett Abu, MD;  Location: [facility];  Service: Neurosurgery;  Laterality: N/A;  C56 C67 artificial disc replacement   CESAREAN SECTION     COLONOSCOPY     DILATATION & CURETTAGE/HYSTEROSCOPY WITH TRUECLEAR N/A 11/25/2013   Procedure: DILATATION & CURETTAGE/HYSTEROSCOPY WITH TRUCLEAR;  Surgeon: Meriel Pica, MD;  Location: [facility];  Service: Gynecology;  Laterality: N/A;   ECTOPIC PREGNANCY SURGERY     GALLBLADDER SURGERY     MOUTH SURGERY     child- dog bite   SPINAL FUSION     2023   STERIOD INJECTION Left 09/09/2022   Procedure: LEFT KNEE STEROID INJECTION;  Surgeon: Ollen Gross, MD;   Location: [facility];  Service: Orthopedics;  Laterality: Left;  left knee injection 0928   TONSILLECTOMY     TOTAL KNEE ARTHROPLASTY Right 09/09/2022   Procedure: TOTAL KNEE ARTHROPLASTY;  Surgeon: Ollen Gross, MD;  Location: [facility];  Service: Orthopedics;  Laterality: Right;   TUBAL LIGATION     Patient Active Problem List   Diagnosis Date Noted   OA (osteoarthritis) of knee 09/09/2022   Exertional dyspnea 04/17/2022   Elevated coronary artery calcium score 04/17/2022   Spondylolisthesis at L4-L5 level 12/27/2021   Ductal carcinoma in situ (DCIS) of left breast 01/19/2018   Generalized anxiety disorder 11/07/2017   Depression 11/07/2017   Insomnia 11/07/2017   Cervical spondylosis 03/19/2013    PCP: Creola Corn  REFERRING PROVIDER: Trudee Grip  REFERRING DIAG: R TKA 09/09/22  THERAPY DIAG:  Chronic bilateral back pain, unspecified back location  Muscle weakness (generalized)  S/P total knee arthroplasty, right  Other abnormalities of gait and mobility  Acute pain of right knee  Stiffness of right knee, not elsewhere classified  Rationale for Evaluation and Treatment: Rehabilitation  ONSET DATE: 09/09/22  SUBJECTIVE:   SUBJECTIVE STATEMENT :  Patient reports that she really still has a lot of pain in her back, she saw  the back surgeon yesterday, x-rays looked good, felt like what we are doing is good for helping pain, he has ordered an MRI but it has not been scheduled yet, she reports hours of relief after PT   R TKA 8/19 L knee steroid injection 09/09/22  PAIN:  Are you having pain? Yes: NPRS scale: 9/10 Pain location: right low back, buttock and reports around to the front of the thigh Pain description: sharp really hurts Aggravating factors: difficulty sleeping, difficulty walking and standing and sitting Relieving factors: heat, Aleve , tramadol and methacarbomol  PRECAUTIONS: None  RED FLAGS: None   WEIGHT BEARING RESTRICTIONS: No  FALLS:  Has  patient fallen in last 6 months? No  LIVING ENVIRONMENT: Lives with: lives with their family and lives with their spouse Lives in: House/apartment Stairs: Yes: External: 3 steps; none Has following equipment at home: Single point cane and Walker - 2 wheeled  OCCUPATION: Not working  PLOF: Independent  PATIENT GOALS: less back pain  NEXT MD VISIT: 09/24/22  OBJECTIVE:   PATIENT SURVEYS:  FOTO 4  COGNITION: Overall cognitive status: Within functional limits for tasks assessed      EDEMA:  Increased swelling surrounding R knee  POSTURE: rounded shoulders and flexed trunk   PALPATION: Warm and TTP  LOWER EXTREMITY ROM:  Active ROM Right eval Left eval Right 09/24/22 Right 09/26/22 Right 10/02/22  11/01/22 12/09/22  Hip flexion          Hip extension          Hip abduction          Hip adduction          Hip internal rotation          Hip external rotation          Knee flexion  60  75* AROM, 86 PROM 82* AROM seated per pt request  78* self AAROM, 76* AROM  PROM 87 AROM 81 PROM 90 AROM 83 AROM 101 PROM 105  Knee extension -30  -15* AROM seated, -15 PROM supine   -16* supine heel prop with quad set  AROM 25 PROM 16 AROM 14 AROM 5 PROM 0  Ankle dorsiflexion          Ankle plantarflexion          Ankle inversion          Ankle eversion           (Blank rows = not tested)  LOWER EXTREMITY MMT:  MMT Right eval Left eval  Hip flexion 2   Hip extension    Hip abduction    Hip adduction    Hip internal rotation    Hip external rotation    Knee flexion 2   Knee extension 2   Ankle dorsiflexion    Ankle plantarflexion    Ankle inversion    Ankle eversion     (Blank rows = not tested)  LUMBAR ROM:  decreased 50% flexion, decreased 75% for extension and side bending, pain was worse with extension and right side bending. Hooklying is less painful that lying flat on back Mild tightness with pain during HS stretch, tight and painful piriformis bilaterally worse on  the right , very weak core FUNCTIONAL TESTS:  5 times sit to stand: 30 seconds 03/20/23 Timed up and go (TUG): 1 min 28s  GAIT: Is no without assistive device, mild antalgic on the right    TODAY'S TREATMENT:  DATE:  04/17/23 Gait outside descent pace, this does increase pain Bike level 3 x 5 minutes PROM LE's STM with the T-gun to the low back and buttock Gentle manual sheet traction  04/15/23 Nustep level 5 x 6 minutes Bike level 3 x 5 minutes Leg press 20# and no weight  5# straight arm pulls STM to the right low back and buttock Gentle manual pelvic traction with sheet  04/10/23 Sit ft pelvic ROM 4 way s 15 x each Nustep L 5 Red tband seated trunk ext 2 sets 10,10 x each trunk rotation Iso abs with ball 10 x hold 3 sec Feet on ball bridge, KTC and obl ADD ball squeeze Clams and hip flex red tband 2 sets 10 Passive stretch to the LE's Very gentle manual sheet traction lumbar     04/08/23 STM with the Tgun to the right low back and right buttock Feet on ball K2C, small rotation, small bridge and isometric abs Passive stretch to the LE's Very gentle manual sheet traction lumbar  MHP/IFC to the right low back in supine feet elevated  04/03/23 Nustep L 5 6 min Sitting on dyna disc pelvic ROM 10x for pelvic ROM and stab Sitting on dyna disc for stab LAQ,hip flex,hip abd 10 x each Sitting on dyna disc shld row and ext red tband 2 sets 10 Supine LE stretches- HS, LTR, SKTC, piriformis, glutes RT LE NT stretching-positive MH/IFC RT LB supine     04/01/23 Walk outdoors  NuStep L5x2mins  Seated row 20# 2x10 Lat pull down 20# 2x10 BlackTB ext 2x10 Supine LE stretches- HS, LTR, SKTC, piriformis, glutes    03/26/23 NuStep L5 x 6 min Bridges LE on pball bridges x5, K2C x10, Oblq   Passive stretching to HS, piriformis, ITB, Glute Shoulder  Ext 5lb x10 Standing rows 5lb x12  03/20/23 Evaluation of the lumbar spine order from Dr. Danielle Dess  03/17/23 Gait outside negotiated curbs, back building stairs step over step, outside walking and then in grass, slope, uneven terrain On upside down bosu balance and reaching 40# resisted gait fwd and backward, some back pain with this Passive HS and piriformis stretches Green tband clamshells Feet on ball K2C, trunk rotation, small posterior activation, isometric abs Used Tgun and focused on the right low back and the right buttock for pain and spasms  03/13/23 Home visit: Stairs: in side are 7", has a banister on the left going up part way and then on right after a turn, the bottom part of the banister is not completely sturdy Entrances:  front door 8" step no railing,  Back door steps are 8" at top due to threshold, but other stairs are 4"-7" with a railing on the left going up, we problem solved her turning toward the door jamb at the top of the stairs and using the door jamb to pull herself into the home, intead of the big step up and over. Bedroom entrance from outside, has railing on the right going up, 6"  Bathtub:  23" clearance, we worked on getting in and out using a couple of exercise mats as cushions, our best option was to sit on the edge of the tub, turn and bring feet in and slide or duck walk down into the tub.  Then to get out bend left knee up under her. roll to her left onto her knee and hand walk up and then step out Shower entrance is 8", would need to use walls to help get  in and out of the shower  PATIENT EDUCATION:  Education details: POC and HEP Person educated: Patient Education method: Explanation Education comprehension: verbalized understanding  HOME EXERCISE PROGRAM: Access Code: [facility] Access Code: [email or web address] URL: [email or web address] Date: 03/20/2023 Prepared by: Stacie Glaze  Exercises - Hooklying Single Knee to Chest Stretch  - 2 x  daily - 7 x weekly - 1 sets - 10 reps - 5 hold - Supine Double Knee to Chest  - 2 x daily - 7 x weekly - 1 sets - 10 reps - 5 hold - Supine Lower Trunk Rotation with PLB  - 2 x daily - 7 x weekly - 1 sets - 10 reps - 5 hold - Supine Posterior Pelvic Tilt  - 2 x daily - 7 x weekly - 1 sets - 10 reps - 3 hold  ASSESSMENT:  CLINICAL IMPRESSION:   Pt really hurting in the right low back and buttock, MD feels we can continue with what we are doing and he is ordering an MRI.  Tried to get pt moving and work  on stab with cuing. No goals met d/t pain.  She does seem to have maintained her knee ROM as she was able to go around full revolutions without trunk lean. OBJECTIVE IMPAIRMENTS: Abnormal gait, cardiopulmonary status limiting activity, decreased activity tolerance, decreased endurance, decreased mobility, difficulty walking, decreased ROM, decreased strength, increased muscle spasms, impaired flexibility, improper body mechanics, postural dysfunction, and pain.   REHAB POTENTIAL: Good  CLINICAL DECISION MAKING: Stable/uncomplicated  EVALUATION COMPLEXITY: Low   GOALS: Goals reviewed with patient? Yes  SHORT TERM GOALS: Target date: 10/24/22  Patient will be independent with initial HEP. Goal status: 09/20/22 met  2.  Patient will be do TUG < 30s Baseline: 1 min 28s  Goal status: ongoing 10/07/22, 18.57s MET 10/09/22  3.  Patient will be able to do 5 sit to stands  Baseline: unable to do  Goal status: progressing 10/04/22, MET 10/09/22   LONG TERM GOALS: Target date: 01/31/23  Patient will be independent with advanced/ongoing HEP to improve outcomes and carryover.  Goal status: ongoing 11/06/22  2.  Patient will report at least 75% improvement in R knee pain to improve QOL. Baseline: 10/10 Goal status: met 02/03/23 60% better 11/20/22, 65% 12/06/22, 70% 12/17/22  3.  Patient will demonstrate improved R knee AROM to >/= 0-120 deg to allow for normal gait and stair  mechanics. Baseline: 30-0-60 10/09/22: 16-0-78  11/01/22: 14-0-83 11/20/22: 10-97 12/06/22: 10-0-100 12/17/22: 8-0-104 Goal status: continue to progress and getting closer 0-112  03/13/23  4.  Patient will demonstrate improved functional LE strength as demonstrated by 5/5. Baseline: 2/5 Goal status: MET 3+/5 10/09/22, 5/5 11/01/22   5.  Patient will be able to ambulate 500' with LRAD and normal gait pattern without increased pain to access community.  Baseline: using RW small in home ambulation distances Goal status: progressing w/SPC 12/02/22, doing some walking without AD   6.  Patient will report 73 on FOTO (patient reported outcome measure) to demonstrate improved functional ability. Baseline: 4, 40-11/11/24 Goal status: MET 53 12/17/22  7.  Decrease lumbar pain 50%  Goal status: 04/03/23 on going  8.  Increase lumbar ROM 25%  Goal status: 04/03/23 no changes from eval  9.  Report able to put shoes and socks on without pain > 4/10  Goal status:   PLAN:  PT FREQUENCY: 2x/week  PT DURATION: 12 weeks  PLANNED INTERVENTIONS: Therapeutic exercises, Therapeutic activity, Neuromuscular re-education, Balance  training, Gait training, Patient/Family education, Self Care, Joint mobilization, Stair training, Electrical stimulation, Cryotherapy, Moist heat, scar mobilization, Vasopneumatic device, and Manual therapy  PLAN FOR NEXT SESSION: Will see what the MRI says   Patient Details  Name: [name] MRN: [id number] Date of Birth: November 12, 1960 Referring Provider:  Barnett Abu, MD  Encounter Date: 04/17/2023   Jearld Lesch, PT 04/17/2023, 1:11 PM

## 2023-04-21 ENCOUNTER — Encounter: Payer: Self-pay | Admitting: Physical Therapy

## 2023-04-21 ENCOUNTER — Ambulatory Visit: Admitting: Physical Therapy

## 2023-04-21 DIAGNOSIS — M549 Dorsalgia, unspecified: Secondary | ICD-10-CM | POA: Diagnosis not present

## 2023-04-21 DIAGNOSIS — M6281 Muscle weakness (generalized): Secondary | ICD-10-CM

## 2023-04-21 DIAGNOSIS — M25561 Pain in right knee: Secondary | ICD-10-CM

## 2023-04-21 DIAGNOSIS — G8929 Other chronic pain: Secondary | ICD-10-CM

## 2023-04-21 DIAGNOSIS — Z96651 Presence of right artificial knee joint: Secondary | ICD-10-CM

## 2023-04-21 DIAGNOSIS — M25661 Stiffness of right knee, not elsewhere classified: Secondary | ICD-10-CM

## 2023-04-21 NOTE — Therapy (Signed)
 OUTPATIENT PHYSICAL THERAPY LOWER EXTREMITY TREATMENT     Patient Name: [name] MRN: [id number] DOB:[birth year], 63 y.o., female Today's Date: 04/21/2023  END OF SESSION:  PT End of Session - 04/21/23 1530     Visit Number 74    Date for PT Re-Evaluation 05/18/23    Authorization Type [employer]    PT Start Time 1530    PT Stop Time 1615    PT Time Calculation (min) 45 min    Activity Tolerance Patient tolerated treatment well    Behavior During Therapy WFL for tasks assessed/performed              Past Medical History:  Diagnosis Date   Arthritis    Back pain of lumbar region with sciatica    Cancer (HCC)    breast   Depression    Diabetes mellitus, type II (HCC)    Family history of anesthesia complication    grandfather died under anesthesia 70's- had heart issues   H/O carpal tunnel repair 2014   Headache(784.0)    Hyperlipidemia    Hypertension    Hypothyroidism    Medial meniscus tear    PONV (postoperative nausea and vomiting)    Sleep apnea    cpap since 10   Past Surgical History:  Procedure Laterality Date   BILATERAL TOTAL MASTECTOMY WITH AXILLARY LYMPH NODE DISSECTION     BREAST RECONSTRUCTION     CERVICAL DISC ARTHROPLASTY N/A 03/19/2013   Procedure: CERVICAL FIVE TO SIX, CERVICAL SIX TO SEVEN CERVICAL ANTERIOR DISC ARTHROPLASTY;  Surgeon: Barnett Abu, MD;  Location: [facility];  Service: Neurosurgery;  Laterality: N/A;  C56 C67 artificial disc replacement   CESAREAN SECTION     COLONOSCOPY     DILATATION & CURETTAGE/HYSTEROSCOPY WITH TRUECLEAR N/A 11/25/2013   Procedure: DILATATION & CURETTAGE/HYSTEROSCOPY WITH TRUCLEAR;  Surgeon: Meriel Pica, MD;  Location: [facility];  Service: Gynecology;  Laterality: N/A;   ECTOPIC PREGNANCY SURGERY     GALLBLADDER SURGERY     MOUTH SURGERY     child- dog bite   SPINAL FUSION     2023   STERIOD INJECTION Left 09/09/2022   Procedure: LEFT KNEE STEROID INJECTION;  Surgeon: Ollen Gross, MD;   Location: [facility];  Service: Orthopedics;  Laterality: Left;  left knee injection 0928   TONSILLECTOMY     TOTAL KNEE ARTHROPLASTY Right 09/09/2022   Procedure: TOTAL KNEE ARTHROPLASTY;  Surgeon: Ollen Gross, MD;  Location: [facility];  Service: Orthopedics;  Laterality: Right;   TUBAL LIGATION     Patient Active Problem List   Diagnosis Date Noted   OA (osteoarthritis) of knee 09/09/2022   Exertional dyspnea 04/17/2022   Elevated coronary artery calcium score 04/17/2022   Spondylolisthesis at L4-L5 level 12/27/2021   Ductal carcinoma in situ (DCIS) of left breast 01/19/2018   Generalized anxiety disorder 11/07/2017   Depression 11/07/2017   Insomnia 11/07/2017   Cervical spondylosis 03/19/2013    PCP: Creola Corn  REFERRING PROVIDER: Trudee Grip  REFERRING DIAG: R TKA 09/09/22  THERAPY DIAG:  Chronic bilateral back pain, unspecified back location  Muscle weakness (generalized)  S/P total knee arthroplasty, right  Acute pain of right knee  Stiffness of right knee, not elsewhere classified  Rationale for Evaluation and Treatment: Rehabilitation  ONSET DATE: 09/09/22  SUBJECTIVE:   SUBJECTIVE STATEMENT :  Patient reports that she has not heard back about the MRI, she reports pain is a 7/10 today.  Reports that she has  not cried the last two nights when going to bed  R TKA 8/19 L knee steroid injection 09/09/22  PAIN:  Are you having pain? Yes: NPRS scale: 7/10 Pain location: right low back, buttock and reports around to the front of the thigh Pain description: sharp really hurts Aggravating factors: difficulty sleeping, difficulty walking and standing and sitting Relieving factors: heat, Aleve , tramadol and methacarbomol  PRECAUTIONS: None  RED FLAGS: None   WEIGHT BEARING RESTRICTIONS: No  FALLS:  Has patient fallen in last 6 months? No  LIVING ENVIRONMENT: Lives with: lives with their family and lives with their spouse Lives in: House/apartment Stairs:  Yes: External: 3 steps; none Has following equipment at home: Single point cane and Walker - 2 wheeled  OCCUPATION: Not working  PLOF: Independent  PATIENT GOALS: less back pain  NEXT MD VISIT: 09/24/22  OBJECTIVE:   PATIENT SURVEYS:  FOTO 4  COGNITION: Overall cognitive status: Within functional limits for tasks assessed      EDEMA:  Increased swelling surrounding R knee  POSTURE: rounded shoulders and flexed trunk   PALPATION: Warm and TTP  LOWER EXTREMITY ROM:  Active ROM Right eval Left eval Right 09/24/22 Right 09/26/22 Right 10/02/22  11/01/22 12/09/22  Hip flexion          Hip extension          Hip abduction          Hip adduction          Hip internal rotation          Hip external rotation          Knee flexion  60  75* AROM, 86 PROM 82* AROM seated per pt request  78* self AAROM, 76* AROM  PROM 87 AROM 81 PROM 90 AROM 83 AROM 101 PROM 105  Knee extension -30  -15* AROM seated, -15 PROM supine   -16* supine heel prop with quad set  AROM 25 PROM 16 AROM 14 AROM 5 PROM 0  Ankle dorsiflexion          Ankle plantarflexion          Ankle inversion          Ankle eversion           (Blank rows = not tested)  LOWER EXTREMITY MMT:  MMT Right eval Left eval  Hip flexion 2   Hip extension    Hip abduction    Hip adduction    Hip internal rotation    Hip external rotation    Knee flexion 2   Knee extension 2   Ankle dorsiflexion    Ankle plantarflexion    Ankle inversion    Ankle eversion     (Blank rows = not tested)  LUMBAR ROM:  decreased 50% flexion, decreased 75% for extension and side bending, pain was worse with extension and right side bending. Hooklying is less painful that lying flat on back Mild tightness with pain during HS stretch, tight and painful piriformis bilaterally worse on the right , very weak core FUNCTIONAL TESTS:  5 times sit to stand: 30 seconds 03/20/23 Timed up and go (TUG): 1 min 28s  GAIT: Is no without assistive  device, mild antalgic on the right    TODAY'S TREATMENT:  DATE:  04/21/23 Gait outside around the parking [employer] level 4 x 5 minutes 5# straight arm pulls mild increase of LBP Feet on ball K2C, rotation, smaal posterior activation , isometric abs Passive LE stretches STM with the T-gun to the right low back and buttock on the right Manual pelvic traction  04/17/23 Gait outside descent pace, this does increase pain Bike level 3 x 5 minutes PROM LE's STM with the T-gun to the low back and buttock Gentle manual sheet traction  04/15/23 Nustep level 5 x 6 minutes Bike level 3 x 5 minutes Leg press 20# and no weight  5# straight arm pulls STM to the right low back and buttock Gentle manual pelvic traction with sheet  04/10/23 Sit ft pelvic ROM 4 way s 15 x each Nustep L 5 Red tband seated trunk ext 2 sets 10,10 x each trunk rotation Iso abs with ball 10 x hold 3 sec Feet on ball bridge, KTC and obl ADD ball squeeze Clams and hip flex red tband 2 sets 10 Passive stretch to the LE's Very gentle manual sheet traction lumbar     04/08/23 STM with the Tgun to the right low back and right buttock Feet on ball K2C, small rotation, small bridge and isometric abs Passive stretch to the LE's Very gentle manual sheet traction lumbar  MHP/IFC to the right low back in supine feet elevated  04/03/23 Nustep L 5 6 min Sitting on dyna disc pelvic ROM 10x for pelvic ROM and stab Sitting on dyna disc for stab LAQ,hip flex,hip abd 10 x each Sitting on dyna disc shld row and ext red tband 2 sets 10 Supine LE stretches- HS, LTR, SKTC, piriformis, glutes RT LE NT stretching-positive MH/IFC RT LB supine     04/01/23 Walk outdoors  NuStep L5x51mins  Seated row 20# 2x10 Lat pull down 20# 2x10 BlackTB ext 2x10 Supine LE stretches- HS, LTR, SKTC, piriformis,  glutes    03/26/23 NuStep L5 x 6 min Bridges LE on pball bridges x5, K2C x10, Oblq   Passive stretching to HS, piriformis, ITB, Glute Shoulder Ext 5lb x10 Standing rows 5lb x12  03/20/23 Evaluation of the lumbar spine order from Dr. Danielle Dess  PATIENT EDUCATION:  Education details: POC and HEP Person educated: Patient Education method: Explanation Education comprehension: verbalized understanding  HOME EXERCISE PROGRAM: Access Code: [facility] Access Code: [email or web address] URL: [email or web address] Date: 03/20/2023 Prepared by: Stacie Glaze  Exercises - Hooklying Single Knee to Chest Stretch  - 2 x daily - 7 x weekly - 1 sets - 10 reps - 5 hold - Supine Double Knee to Chest  - 2 x daily - 7 x weekly - 1 sets - 10 reps - 5 hold - Supine Lower Trunk Rotation with PLB  - 2 x daily - 7 x weekly - 1 sets - 10 reps - 5 hold - Supine Posterior Pelvic Tilt  - 2 x daily - 7 x weekly - 1 sets - 10 reps - 3 hold  ASSESSMENT:  CLINICAL IMPRESSION:   Pt reports a little less pain today, she reports that when she has gone to bed the last two nights she has not cried because of the pain.  With the walk today no increase of pain, did have pain with bridge and with the straight arm pulls.  She still reports relief with the manual sheet traction OBJECTIVE IMPAIRMENTS: Abnormal gait, cardiopulmonary status limiting activity, decreased activity tolerance, decreased endurance, decreased mobility, difficulty walking, decreased ROM,  decreased strength, increased muscle spasms, impaired flexibility, improper body mechanics, postural dysfunction, and pain.   REHAB POTENTIAL: Good  CLINICAL DECISION MAKING: Stable/uncomplicated  EVALUATION COMPLEXITY: Low   GOALS: Goals reviewed with patient? Yes  SHORT TERM GOALS: Target date: 10/24/22  Patient will be independent with initial HEP. Goal status: 09/20/22 met  2.  Patient will be do TUG < 30s Baseline: 1 min 28s  Goal status: ongoing  10/07/22, 18.57s MET 10/09/22  3.  Patient will be able to do 5 sit to stands  Baseline: unable to do  Goal status: progressing 10/04/22, MET 10/09/22   LONG TERM GOALS: Target date: 01/31/23  Patient will be independent with advanced/ongoing HEP to improve outcomes and carryover.  Goal status: ongoing 11/06/22  2.  Patient will report at least 75% improvement in R knee pain to improve QOL. Baseline: 10/10 Goal status: met 02/03/23 60% better 11/20/22, 65% 12/06/22, 70% 12/17/22  3.  Patient will demonstrate improved R knee AROM to >/= 0-120 deg to allow for normal gait and stair mechanics. Baseline: 30-0-60 10/09/22: 16-0-78  11/01/22: 14-0-83 11/20/22: 10-97 12/06/22: 10-0-100 12/17/22: 8-0-104 Goal status: continue to progress and getting closer 0-112  03/13/23  4.  Patient will demonstrate improved functional LE strength as demonstrated by 5/5. Baseline: 2/5 Goal status: MET 3+/5 10/09/22, 5/5 11/01/22   5.  Patient will be able to ambulate 500' with LRAD and normal gait pattern without increased pain to access community.  Baseline: using RW small in home ambulation distances Goal status: progressing w/SPC 12/02/22, doing some walking without AD   6.  Patient will report 59 on FOTO (patient reported outcome measure) to demonstrate improved functional ability. Baseline: 4, 40-11/11/24 Goal status: MET 53 12/17/22  7.  Decrease lumbar pain 50%  Goal status: 04/03/23 on going  8.  Increase lumbar ROM 25%  Goal status: 04/03/23 no changes from eval  9.  Report able to put shoes and socks on without pain > 4/10  Goal status: ongoing still pain 7/10 04/21/23  PLAN:  PT FREQUENCY: 2x/week  PT DURATION: 12 weeks  PLANNED INTERVENTIONS: Therapeutic exercises, Therapeutic activity, Neuromuscular re-education, Balance training, Gait training, Patient/Family education, Self Care, Joint mobilization, Stair training, Electrical stimulation, Cryotherapy, Moist heat, scar mobilization,  Vasopneumatic device, and Manual therapy  PLAN FOR NEXT SESSION: Will see what the MRI says   Patient Details  Name: ZOHAL RENY MRN: [id number] Date of Birth: 11-Mar-1960 Referring Provider:  Barnett Abu, MD  Encounter Date: 04/21/2023   Jearld Lesch, PT 04/21/2023, 3:31 PM

## 2023-04-24 ENCOUNTER — Ambulatory Visit: Attending: Neurological Surgery | Admitting: Physical Therapy

## 2023-04-24 ENCOUNTER — Encounter: Payer: Self-pay | Admitting: Physical Therapy

## 2023-04-24 DIAGNOSIS — M6281 Muscle weakness (generalized): Secondary | ICD-10-CM | POA: Diagnosis present

## 2023-04-24 DIAGNOSIS — M549 Dorsalgia, unspecified: Secondary | ICD-10-CM | POA: Insufficient documentation

## 2023-04-24 DIAGNOSIS — R600 Localized edema: Secondary | ICD-10-CM | POA: Insufficient documentation

## 2023-04-24 DIAGNOSIS — Z96651 Presence of right artificial knee joint: Secondary | ICD-10-CM | POA: Insufficient documentation

## 2023-04-24 DIAGNOSIS — M25561 Pain in right knee: Secondary | ICD-10-CM | POA: Diagnosis present

## 2023-04-24 DIAGNOSIS — M25661 Stiffness of right knee, not elsewhere classified: Secondary | ICD-10-CM | POA: Insufficient documentation

## 2023-04-24 DIAGNOSIS — R2689 Other abnormalities of gait and mobility: Secondary | ICD-10-CM | POA: Insufficient documentation

## 2023-04-24 DIAGNOSIS — G8929 Other chronic pain: Secondary | ICD-10-CM | POA: Insufficient documentation

## 2023-04-24 NOTE — Therapy (Signed)
 OUTPATIENT PHYSICAL THERAPY LOWER EXTREMITY TREATMENT     Patient Name: [name] MRN: [id number] DOB:1960/02/17, 63 y.o., female Today's Date: 04/24/2023  END OF SESSION:  PT End of Session - 04/24/23 1626     Visit Number 75    Date for PT Re-Evaluation 05/18/23    Authorization Type U[employer]    PT Start Time 1615    PT Stop Time 1700    PT Time Calculation (min) 45 min    Activity Tolerance Patient tolerated treatment well    Behavior During Therapy WFL for tasks assessed/performed              Past Medical History:  Diagnosis Date   Arthritis    Back pain of lumbar region with sciatica    Cancer (HCC)    breast   Depression    Diabetes mellitus, type II (HCC)    Family history of anesthesia complication    grandfather died under anesthesia 70's- had heart issues   H/O carpal tunnel repair 2014   Headache(784.0)    Hyperlipidemia    Hypertension    Hypothyroidism    Medial meniscus tear    PONV (postoperative nausea and vomiting)    Sleep apnea    cpap since 10   Past Surgical History:  Procedure Laterality Date   BILATERAL TOTAL MASTECTOMY WITH AXILLARY LYMPH NODE DISSECTION     BREAST RECONSTRUCTION     CERVICAL DISC ARTHROPLASTY N/A 03/19/2013   Procedure: CERVICAL FIVE TO SIX, CERVICAL SIX TO SEVEN CERVICAL ANTERIOR DISC ARTHROPLASTY;  Surgeon: Barnett Abu, MD;  Location: [facility];  Service: Neurosurgery;  Laterality: N/A;  C56 C67 artificial disc replacement   CESAREAN SECTION     COLONOSCOPY     DILATATION & CURETTAGE/HYSTEROSCOPY WITH TRUECLEAR N/A 11/25/2013   Procedure: DILATATION & CURETTAGE/HYSTEROSCOPY WITH TRUCLEAR;  Surgeon: Meriel Pica, MD;  Location: [facility];  Service: Gynecology;  Laterality: N/A;   ECTOPIC PREGNANCY SURGERY     GALLBLADDER SURGERY     MOUTH SURGERY     child- dog bite   SPINAL FUSION     2023   STERIOD INJECTION Left 09/09/2022   Procedure: LEFT KNEE STEROID INJECTION;  Surgeon: Ollen Gross, MD;   Location: [facility];  Service: Orthopedics;  Laterality: Left;  left knee injection 0928   TONSILLECTOMY     TOTAL KNEE ARTHROPLASTY Right 09/09/2022   Procedure: TOTAL KNEE ARTHROPLASTY;  Surgeon: Ollen Gross, MD;  Location: [facility];  Service: Orthopedics;  Laterality: Right;   TUBAL LIGATION     Patient Active Problem List   Diagnosis Date Noted   OA (osteoarthritis) of knee 09/09/2022   Exertional dyspnea 04/17/2022   Elevated coronary artery calcium score 04/17/2022   Spondylolisthesis at L4-L5 level 12/27/2021   Ductal carcinoma in situ (DCIS) of left breast 01/19/2018   Generalized anxiety disorder 11/07/2017   Depression 11/07/2017   Insomnia 11/07/2017   Cervical spondylosis 03/19/2013    PCP: Creola Corn  REFERRING PROVIDER: Trudee Grip  REFERRING DIAG: R TKA 09/09/22  THERAPY DIAG:  Chronic bilateral back pain, unspecified back location  Muscle weakness (generalized)  S/P total knee arthroplasty, right  Acute pain of right knee  Stiffness of right knee, not elsewhere classified  Localized edema  Other abnormalities of gait and mobility  Rationale for Evaluation and Treatment: Rehabilitation  ONSET DATE: 09/09/22  SUBJECTIVE:   SUBJECTIVE STATEMENT :  Patient reports that she has MRI scheduled for April 24.  She is  still in pain but reports that she feels she is walking better  R TKA 8/19 L knee steroid injection 09/09/22  PAIN:  Are you having pain? Yes: NPRS scale: 7/10 Pain location: right low back, buttock and reports around to the front of the thigh Pain description: sharp really hurts Aggravating factors: difficulty sleeping, difficulty walking and standing and sitting Relieving factors: heat, Aleve , tramadol and methacarbomol  PRECAUTIONS: None  RED FLAGS: None   WEIGHT BEARING RESTRICTIONS: No  FALLS:  Has patient fallen in last 6 months? No  LIVING ENVIRONMENT: Lives with: lives with their family and lives with their spouse Lives  in: House/apartment Stairs: Yes: External: 3 steps; none Has following equipment at home: Single point cane and Walker - 2 wheeled  OCCUPATION: Not working  PLOF: Independent  PATIENT GOALS: less back pain  NEXT MD VISIT: 09/24/22  OBJECTIVE:   PATIENT SURVEYS:  FOTO 4  COGNITION: Overall cognitive status: Within functional limits for tasks assessed      EDEMA:  Increased swelling surrounding R knee  POSTURE: rounded shoulders and flexed trunk   PALPATION: Warm and TTP  LOWER EXTREMITY ROM:  Active ROM Right eval Left eval Right 09/24/22 Right 09/26/22 Right 10/02/22  11/01/22 12/09/22  Hip flexion          Hip extension          Hip abduction          Hip adduction          Hip internal rotation          Hip external rotation          Knee flexion  60  75* AROM, 86 PROM 82* AROM seated per pt request  78* self AAROM, 76* AROM  PROM 87 AROM 81 PROM 90 AROM 83 AROM 101 PROM 105  Knee extension -30  -15* AROM seated, -15 PROM supine   -16* supine heel prop with quad set  AROM 25 PROM 16 AROM 14 AROM 5 PROM 0  Ankle dorsiflexion          Ankle plantarflexion          Ankle inversion          Ankle eversion           (Blank rows = not tested)  LOWER EXTREMITY MMT:  MMT Right eval Left eval  Hip flexion 2   Hip extension    Hip abduction    Hip adduction    Hip internal rotation    Hip external rotation    Knee flexion 2   Knee extension 2   Ankle dorsiflexion    Ankle plantarflexion    Ankle inversion    Ankle eversion     (Blank rows = not tested)  LUMBAR ROM:  decreased 50% flexion, decreased 75% for extension and side bending, pain was worse with extension and right side bending. Hooklying is less painful that lying flat on back Mild tightness with pain during HS stretch, tight and painful piriformis bilaterally worse on the right , very weak core FUNCTIONAL TESTS:  5 times sit to stand: 30 seconds 03/20/23 Timed up and go (TUG): 1 min  28s  GAIT: Is no without assistive device, mild antalgic on the right    TODAY'S TREATMENT:  DATE:  04/24/23 Gait walking around the back building, she reported no increase of LBP but was already pain at a 7/10, she reports that she did not have to push on her back with her hands Feet on ball K2C, rotation, small bridge, isometric abs Passive LE stretches Passive gentle sheet lumbar traction STM with the T-gun to the lumbar area and the glutes  04/21/23 Gait outside around the parking [employer] level 4 x 5 minutes 5# straight arm pulls mild increase of LBP Feet on ball K2C, rotation, smaal posterior activation , isometric abs Passive LE stretches STM with the T-gun to the right low back and buttock on the right Manual pelvic traction  04/17/23 Gait outside descent pace, this does increase pain Bike level 3 x 5 minutes PROM LE's STM with the T-gun to the low back and buttock Gentle manual sheet traction  04/15/23 Nustep level 5 x 6 minutes Bike level 3 x 5 minutes Leg press 20# and no weight  5# straight arm pulls STM to the right low back and buttock Gentle manual pelvic traction with sheet  04/10/23 Sit ft pelvic ROM 4 way s 15 x each Nustep L 5 Red tband seated trunk ext 2 sets 10,10 x each trunk rotation Iso abs with ball 10 x hold 3 sec Feet on ball bridge, KTC and obl ADD ball squeeze Clams and hip flex red tband 2 sets 10 Passive stretch to the LE's Very gentle manual sheet traction lumbar     04/08/23 STM with the Tgun to the right low back and right buttock Feet on ball K2C, small rotation, small bridge and isometric abs Passive stretch to the LE's Very gentle manual sheet traction lumbar  MHP/IFC to the right low back in supine feet elevated  04/03/23 Nustep L 5 6 min Sitting on dyna disc pelvic ROM 10x for pelvic ROM and  stab Sitting on dyna disc for stab LAQ,hip flex,hip abd 10 x each Sitting on dyna disc shld row and ext red tband 2 sets 10 Supine LE stretches- HS, LTR, SKTC, piriformis, glutes RT LE NT stretching-positive MH/IFC RT LB supine   04/01/23 Walk outdoors  NuStep L5x72mins  Seated row 20# 2x10 Lat pull down 20# 2x10 BlackTB ext 2x10 Supine LE stretches- HS, LTR, SKTC, piriformis, glutes    03/26/23 NuStep L5 x 6 min Bridges LE on pball bridges x5, K2C x10, Oblq   Passive stretching to HS, piriformis, ITB, Glute Shoulder Ext 5lb x10 Standing rows 5lb x12  03/20/23 Evaluation of the lumbar spine order from Dr. Danielle Dess  PATIENT EDUCATION:  Education details: POC and HEP Person educated: Patient Education method: Explanation Education comprehension: verbalized understanding  HOME EXERCISE PROGRAM: Access Code: [facility] Access Code: [email or web address] URL: [email or web address] Date: 03/20/2023 Prepared by: Stacie Glaze  Exercises - Hooklying Single Knee to Chest Stretch  - 2 x daily - 7 x weekly - 1 sets - 10 reps - 5 hold - Supine Double Knee to Chest  - 2 x daily - 7 x weekly - 1 sets - 10 reps - 5 hold - Supine Lower Trunk Rotation with PLB  - 2 x daily - 7 x weekly - 1 sets - 10 reps - 5 hold - Supine Posterior Pelvic Tilt  - 2 x daily - 7 x weekly - 1 sets - 10 reps - 3 hold  ASSESSMENT:  CLINICAL IMPRESSION:   Pt reports a little less pain today, She was able to walk around the building  today without an increase of pain and without stopping and without putting her hands on her back.  She will have an MRI in 3 weeks.   She still reports relief with the manual sheet traction OBJECTIVE IMPAIRMENTS: Abnormal gait, cardiopulmonary status limiting activity, decreased activity tolerance, decreased endurance, decreased mobility, difficulty walking, decreased ROM, decreased strength, increased muscle spasms, impaired flexibility, improper body mechanics, postural  dysfunction, and pain.   REHAB POTENTIAL: Good  CLINICAL DECISION MAKING: Stable/uncomplicated  EVALUATION COMPLEXITY: Low   GOALS: Goals reviewed with patient? Yes  SHORT TERM GOALS: Target date: 10/24/22  Patient will be independent with initial HEP. Goal status: 09/20/22 met  2.  Patient will be do TUG < 30s Baseline: 1 min 28s  Goal status: ongoing 10/07/22, 18.57s MET 10/09/22  3.  Patient will be able to do 5 sit to stands  Baseline: unable to do  Goal status: progressing 10/04/22, MET 10/09/22   LONG TERM GOALS: Target date: 01/31/23  Patient will be independent with advanced/ongoing HEP to improve outcomes and carryover.  Goal status: ongoing 11/06/22  2.  Patient will report at least 75% improvement in R knee pain to improve QOL. Baseline: 10/10 Goal status: met 02/03/23 60% better 11/20/22, 65% 12/06/22, 70% 12/17/22  3.  Patient will demonstrate improved R knee AROM to >/= 0-120 deg to allow for normal gait and stair mechanics. Baseline: 30-0-60 10/09/22: 16-0-78  11/01/22: 14-0-83 11/20/22: 10-97 12/06/22: 10-0-100 12/17/22: 8-0-104 Goal status: continue to progress and getting closer 0-112  03/13/23  4.  Patient will demonstrate improved functional LE strength as demonstrated by 5/5. Baseline: 2/5 Goal status: MET 3+/5 10/09/22, 5/5 11/01/22   5.  Patient will be able to ambulate 500' with LRAD and normal gait pattern without increased pain to access community.  Baseline: using RW small in home ambulation distances Goal status: progressing w/SPC 12/02/22, doing some walking without AD   6.  Patient will report 31 on FOTO (patient reported outcome measure) to demonstrate improved functional ability. Baseline: 4, 40-11/11/24 Goal status: MET 53 12/17/22  7.  Decrease lumbar pain 50%  Goal status: ongoing 04/24/23  8.  Increase lumbar ROM 25%  Goal status: ongoing 04/24/23  9.  Report able to put shoes and socks on without pain > 4/10  Goal status: ongoing  still pain 7/10 04/24/23  PLAN:  PT FREQUENCY: 2x/week  PT DURATION: 12 weeks  PLANNED INTERVENTIONS: Therapeutic exercises, Therapeutic activity, Neuromuscular re-education, Balance training, Gait training, Patient/Family education, Self Care, Joint mobilization, Stair training, Electrical stimulation, Cryotherapy, Moist heat, scar mobilization, Vasopneumatic device, and Manual therapy  PLAN FOR NEXT SESSION: continue conservative until MRI   Patient Details  Name: [name] MRN: [id number] Date of Birth: [birth year] Referring Provider:  Barnett Abu, MD  Encounter Date: 04/24/2023   Jearld Lesch, PT 04/24/2023, 4:28 PM

## 2023-04-25 ENCOUNTER — Ambulatory Visit: Admitting: Professional Counselor

## 2023-04-25 ENCOUNTER — Encounter: Payer: Self-pay | Admitting: Professional Counselor

## 2023-04-25 DIAGNOSIS — F331 Major depressive disorder, recurrent, moderate: Secondary | ICD-10-CM

## 2023-04-25 DIAGNOSIS — F411 Generalized anxiety disorder: Secondary | ICD-10-CM | POA: Diagnosis not present

## 2023-04-25 NOTE — Progress Notes (Signed)
      [facility] Counselor/Therapist Progress Note  Patient ID: [name], MRN: [id number],    Date: 04/25/2023  Time Spent: 11:16 AM to 12:14 PM  Treatment Type: Family with patient  Patient spouse present with patient for session.  Reported Symptoms: Sense of overwhelm, vague SI no intent/plan, worries, fears, restlessness, sadness, marital conflict, low mood, irritability, emotional dysregulation  Mental Status Exam:  Appearance:   Neat     Behavior:  Appropriate, Sharing, and Motivated  Motor:  Restlessness   Speech/Language:   Pressured  Affect:  Variable with some heighening  Mood:  anxious, depressed, and irritable  Thought process:  normal  Thought content:    WNL  Sensory/Perceptual disturbances:    WNL  Orientation:  oriented to person, place, time/date, and situation  Attention:  Good  Concentration:  Good  Memory:  WNL  Fund of knowledge:   Good  Insight:    Fair  Judgment:   Good  Impulse Control:  Fair   Risk Assessment: Danger to Self:  Yes.  without intent/plan Self-injurious Behavior: No Danger to Others: No Duty to Warn:no Physical Aggression / Violence:No  Access to Firearms a concern: No  Gang Involvement:No   Subjective: Patient presented to session to address concerns of anxiety and depression.  She reported mixed progress at this time.  Patient and spouse reported positive development when signing up to go to a faith conference together soon and to be looking forward to the time together.  They also reported having a financial advisor scheduled, a goal as discussed in prior sessions.  Patient shared regarding boundary regarding contact with her that her daughter has placed for her own wellbeing.  Patient reflected on her sense of enmeshment regarding relationship with adult children, and her understanding regarding trajectory of dynamics.  Patient also identified difficult episode where she became emotionally dysregulated during marital conflict.   She processed experience of feeling tired of over-functioning in marital relationship.  Counselor actively listened and affirmed progress, and helped patient and her spouse identify strategies for improved communication and increased harmony, including ways to implement boundaries, observing times of need for space, and self soothing as indicated to prevent and to limit escalation.  Counselor assisted patient in identifying coping skills, including: Noting when she needs to take space, to resource support persons or helpline, to seek comfort and Bible reading or prayer, resource external activity outlets, utilize breathing and relaxation techniques.  Coping techniques reinforced with patient's spouse as well.  Counselor recommended DBT group therapy to patient to resource additional support.  Patient and patient's spouse encouraged to practice appreciation and gratitude, and exercise restraint regarding nonnegotiables such as "leaving, stigmatizing, hurtful or resentful content.  Interventions: Solution-Oriented/Positive Psychology, Humanistic/Existential, Insight-Oriented, and Family Systems, Resourcing   Diagnosis:   ICD-10-CM   1. Generalized anxiety disorder  F41.1     2. Major depressive disorder, recurrent episode, moderate (HCC)  F33.1       Plan: Patient is scheduled for follow-up; continue process work and developing coping skills.  Short-term personal goal between sessions to practice gratitude, exercise restraint regarding nonnegotiables as described above, and resource coping skills as described above.  Consider DBT group therapy; referral resources provided by counselor.  Progress note was dictated with Dragon and reviewed for accuracy.  Anthon Kins, [facility]

## 2023-04-28 ENCOUNTER — Encounter: Payer: Self-pay | Admitting: Physical Therapy

## 2023-04-28 ENCOUNTER — Ambulatory Visit: Admitting: Physical Therapy

## 2023-04-28 DIAGNOSIS — M6281 Muscle weakness (generalized): Secondary | ICD-10-CM

## 2023-04-28 DIAGNOSIS — M25561 Pain in right knee: Secondary | ICD-10-CM

## 2023-04-28 DIAGNOSIS — M549 Dorsalgia, unspecified: Secondary | ICD-10-CM | POA: Diagnosis not present

## 2023-04-28 DIAGNOSIS — Z96651 Presence of right artificial knee joint: Secondary | ICD-10-CM

## 2023-04-28 DIAGNOSIS — G8929 Other chronic pain: Secondary | ICD-10-CM

## 2023-04-28 DIAGNOSIS — M25661 Stiffness of right knee, not elsewhere classified: Secondary | ICD-10-CM

## 2023-04-28 NOTE — Therapy (Signed)
 OUTPATIENT PHYSICAL THERAPY LOWER EXTREMITY TREATMENT     Patient Name: [name] MRN: [id number] DOB:July 24, 1960, 63 y.o., female Today's Date: 04/28/2023  END OF SESSION:  PT End of Session - 04/28/23 1529     Visit Number 76    Date for PT Re-Evaluation 05/18/23    Authorization Type [employer]    PT Start Time 1528    PT Stop Time 1615    PT Time Calculation (min) 47 min    Activity Tolerance Patient tolerated treatment well    Behavior During Therapy WFL for tasks assessed/performed              Past Medical History:  Diagnosis Date   Arthritis    Back pain of lumbar region with sciatica    Cancer (HCC)    breast   Depression    Diabetes mellitus, type II (HCC)    Family history of anesthesia complication    grandfather died under anesthesia 70's- had heart issues   H/O carpal tunnel repair 2014   Headache(784.0)    Hyperlipidemia    Hypertension    Hypothyroidism    Medial meniscus tear    PONV (postoperative nausea and vomiting)    Sleep apnea    cpap since 10   Past Surgical History:  Procedure Laterality Date   BILATERAL TOTAL MASTECTOMY WITH AXILLARY LYMPH NODE DISSECTION     BREAST RECONSTRUCTION     CERVICAL DISC ARTHROPLASTY N/A 03/19/2013   Procedure: CERVICAL FIVE TO SIX, CERVICAL SIX TO SEVEN CERVICAL ANTERIOR DISC ARTHROPLASTY;  Surgeon: Barnett Abu, MD;  Location: [facility];  Service: Neurosurgery;  Laterality: N/A;  C56 C67 artificial disc replacement   CESAREAN SECTION     COLONOSCOPY     DILATATION & CURETTAGE/HYSTEROSCOPY WITH TRUECLEAR N/A 11/25/2013   Procedure: DILATATION & CURETTAGE/HYSTEROSCOPY WITH TRUCLEAR;  Surgeon: Meriel Pica, MD;  Location: [facility];  Service: Gynecology;  Laterality: N/A;   ECTOPIC PREGNANCY SURGERY     GALLBLADDER SURGERY     MOUTH SURGERY     child- dog bite   SPINAL FUSION     2023   STERIOD INJECTION Left 09/09/2022   Procedure: LEFT KNEE STEROID INJECTION;  Surgeon: Ollen Gross, MD;   Location: [facility];  Service: Orthopedics;  Laterality: Left;  left knee injection 0928   TONSILLECTOMY     TOTAL KNEE ARTHROPLASTY Right 09/09/2022   Procedure: TOTAL KNEE ARTHROPLASTY;  Surgeon: Ollen Gross, MD;  Location: [facility];  Service: Orthopedics;  Laterality: Right;   TUBAL LIGATION     Patient Active Problem List   Diagnosis Date Noted   OA (osteoarthritis) of knee 09/09/2022   Exertional dyspnea 04/17/2022   Elevated coronary artery calcium score 04/17/2022   Spondylolisthesis at L4-L5 level 12/27/2021   Ductal carcinoma in situ (DCIS) of left breast 01/19/2018   Generalized anxiety disorder 11/07/2017   Depression 11/07/2017   Insomnia 11/07/2017   Cervical spondylosis 03/19/2013    PCP: Creola Corn  REFERRING PROVIDER: Trudee Grip  REFERRING DIAG: R TKA 09/09/22  THERAPY DIAG:  Chronic bilateral back pain, unspecified back location  Muscle weakness (generalized)  S/P total knee arthroplasty, right  Acute pain of right knee  Stiffness of right knee, not elsewhere classified  Rationale for Evaluation and Treatment: Rehabilitation  ONSET DATE: 09/09/22  SUBJECTIVE:   SUBJECTIVE STATEMENT :  Patient reports that she has MRI scheduled for April 24.  REports some right low back pain.  R TKA 8/19 L knee  steroid injection 09/09/22  PAIN:  Are you having pain? Yes: NPRS scale: 7/10 Pain location: right low back, buttock and reports around to the front of the thigh Pain description: sharp really hurts Aggravating factors: difficulty sleeping, difficulty walking and standing and sitting Relieving factors: heat, Aleve , tramadol and methacarbomol  PRECAUTIONS: None  RED FLAGS: None   WEIGHT BEARING RESTRICTIONS: No  FALLS:  Has patient fallen in last 6 months? No  LIVING ENVIRONMENT: Lives with: lives with their family and lives with their spouse Lives in: House/apartment Stairs: Yes: External: 3 steps; none Has following equipment at home: Single  point cane and Walker - 2 wheeled  OCCUPATION: Not working  PLOF: Independent  PATIENT GOALS: less back pain  NEXT MD VISIT: 09/24/22  OBJECTIVE:   PATIENT SURVEYS:  FOTO 4  COGNITION: Overall cognitive status: Within functional limits for tasks assessed      EDEMA:  Increased swelling surrounding R knee  POSTURE: rounded shoulders and flexed trunk   PALPATION: Warm and TTP  LOWER EXTREMITY ROM:  Active ROM Right eval Left eval Right 09/24/22 Right 09/26/22 Right 10/02/22  11/01/22 12/09/22  Hip flexion          Hip extension          Hip abduction          Hip adduction          Hip internal rotation          Hip external rotation          Knee flexion  60  75* AROM, 86 PROM 82* AROM seated per pt request  78* self AAROM, 76* AROM  PROM 87 AROM 81 PROM 90 AROM 83 AROM 101 PROM 105  Knee extension -30  -15* AROM seated, -15 PROM supine   -16* supine heel prop with quad set  AROM 25 PROM 16 AROM 14 AROM 5 PROM 0  Ankle dorsiflexion          Ankle plantarflexion          Ankle inversion          Ankle eversion           (Blank rows = not tested)  LOWER EXTREMITY MMT:  MMT Right eval Left eval  Hip flexion 2   Hip extension    Hip abduction    Hip adduction    Hip internal rotation    Hip external rotation    Knee flexion 2   Knee extension 2   Ankle dorsiflexion    Ankle plantarflexion    Ankle inversion    Ankle eversion     (Blank rows = not tested)  LUMBAR ROM:  decreased 50% flexion, decreased 75% for extension and side bending, pain was worse with extension and right side bending. Hooklying is less painful that lying flat on back Mild tightness with pain during HS stretch, tight and painful piriformis bilaterally worse on the right , very weak core FUNCTIONAL TESTS:  5 times sit to stand: 30 seconds 03/20/23 Timed up and go (TUG): 1 min 28s  GAIT: Is no without assistive device, mild antalgic on the right    TODAY'S TREATMENT:  DATE:  04/28/23 Bike level 4 x 5 minutes with towel roll behind back 5# straight arm pulls cues for posture and core activation Feet on ball K2C, rotation, bridge, isometric abs Passive LE stretches STM to the right low back and buttock Tgun to above Manual sheet traction  04/24/23 Gait walking around the back building, she reported no increase of LBP but was already pain at a 7/10, she reports that she did not have to push on her back with her hands Feet on ball K2C, rotation, small bridge, isometric abs Passive LE stretches Passive gentle sheet lumbar traction STM with the T-gun to the lumbar area and the glutes  04/21/23 Gait outside around the parking [employer] level 4 x 5 minutes 5# straight arm pulls mild increase of LBP Feet on ball K2C, rotation, smaal posterior activation , isometric abs Passive LE stretches STM with the T-gun to the right low back and buttock on the right Manual pelvic traction  04/17/23 Gait outside descent pace, this does increase pain Bike level 3 x 5 minutes PROM LE's STM with the T-gun to the low back and buttock Gentle manual sheet traction  04/15/23 Nustep level 5 x 6 minutes Bike level 3 x 5 minutes Leg press 20# and no weight  5# straight arm pulls STM to the right low back and buttock Gentle manual pelvic traction with sheet  04/10/23 Sit ft pelvic ROM 4 way s 15 x each Nustep L 5 Red tband seated trunk ext 2 sets 10,10 x each trunk rotation Iso abs with ball 10 x hold 3 sec Feet on ball bridge, KTC and obl ADD ball squeeze Clams and hip flex red tband 2 sets 10 Passive stretch to the LE's Very gentle manual sheet traction lumbar     04/08/23 STM with the Tgun to the right low back and right buttock Feet on ball K2C, small rotation, small bridge and isometric abs Passive stretch to the LE's Very gentle manual sheet  traction lumbar  MHP/IFC to the right low back in supine feet elevated  04/03/23 Nustep L 5 6 min Sitting on dyna disc pelvic ROM 10x for pelvic ROM and stab Sitting on dyna disc for stab LAQ,hip flex,hip abd 10 x each Sitting on dyna disc shld row and ext red tband 2 sets 10 Supine LE stretches- HS, LTR, SKTC, piriformis, glutes RT LE NT stretching-positive MH/IFC RT LB supine   04/01/23 Walk outdoors  NuStep L5x64mins  Seated row 20# 2x10 Lat pull down 20# 2x10 BlackTB ext 2x10 Supine LE stretches- HS, LTR, SKTC, piriformis, glutes    03/26/23 NuStep L5 x 6 min Bridges LE on pball bridges x5, K2C x10, Oblq   Passive stretching to HS, piriformis, ITB, Glute Shoulder Ext 5lb x10 Standing rows 5lb x12  03/20/23 Evaluation of the lumbar spine order from Dr. Danielle Dess  PATIENT EDUCATION:  Education details: POC and HEP Person educated: Patient Education method: Explanation Education comprehension: verbalized understanding  HOME EXERCISE PROGRAM: Access Code: [facility] Access Code: [email or web address] URL: [email or web address] Date: 03/20/2023 Prepared by: Stacie Glaze  Exercises - Hooklying Single Knee to Chest Stretch  - 2 x daily - 7 x weekly - 1 sets - 10 reps - 5 hold - Supine Double Knee to Chest  - 2 x daily - 7 x weekly - 1 sets - 10 reps - 5 hold - Supine Lower Trunk Rotation with PLB  - 2 x daily - 7 x weekly - 1 sets - 10 reps -  5 hold - Supine Posterior Pelvic Tilt  - 2 x daily - 7 x weekly - 1 sets - 10 reps - 3 hold  ASSESSMENT:  CLINICAL IMPRESSION:   Pt reports that she is hurting had to sit for a few hours in a meeting today, she reports that what we are doing helps to some extent but still hurting.  Trying to add some core stability and flexibility.  She will have an MRI in 3 weeks.   She still reports relief with the manual sheet traction OBJECTIVE IMPAIRMENTS: Abnormal gait, cardiopulmonary status limiting activity, decreased activity tolerance,  decreased endurance, decreased mobility, difficulty walking, decreased ROM, decreased strength, increased muscle spasms, impaired flexibility, improper body mechanics, postural dysfunction, and pain.   REHAB POTENTIAL: Good  CLINICAL DECISION MAKING: Stable/uncomplicated  EVALUATION COMPLEXITY: Low   GOALS: Goals reviewed with patient? Yes  SHORT TERM GOALS: Target date: 10/24/22  Patient will be independent with initial HEP. Goal status: 09/20/22 met  2.  Patient will be do TUG < 30s Baseline: 1 min 28s  Goal status: ongoing 10/07/22, 18.57s MET 10/09/22  3.  Patient will be able to do 5 sit to stands  Baseline: unable to do  Goal status: progressing 10/04/22, MET 10/09/22   LONG TERM GOALS: Target date: 01/31/23  Patient will be independent with advanced/ongoing HEP to improve outcomes and carryover.  Goal status: ongoing 11/06/22  2.  Patient will report at least 75% improvement in R knee pain to improve QOL. Baseline: 10/10 Goal status: met 02/03/23 60% better 11/20/22, 65% 12/06/22, 70% 12/17/22  3.  Patient will demonstrate improved R knee AROM to >/= 0-120 deg to allow for normal gait and stair mechanics. Baseline: 30-0-60 10/09/22: 16-0-78  11/01/22: 14-0-83 11/20/22: 10-97 12/06/22: 10-0-100 12/17/22: 8-0-104 Goal status: continue to progress and getting closer 0-112  03/13/23  4.  Patient will demonstrate improved functional LE strength as demonstrated by 5/5. Baseline: 2/5 Goal status: MET 3+/5 10/09/22, 5/5 11/01/22   5.  Patient will be able to ambulate 500' with LRAD and normal gait pattern without increased pain to access community.  Baseline: using RW small in home ambulation distances Goal status: progressing w/SPC 12/02/22, doing some walking without AD   6.  Patient will report 32 on FOTO (patient reported outcome measure) to demonstrate improved functional ability. Baseline: 4, 40-11/11/24 Goal status: MET 53 12/17/22  7.  Decrease lumbar pain  50%  Goal status: ongoing 04/24/23  8.  Increase lumbar ROM 25%  Goal status: ongoing 04/24/23  9.  Report able to put shoes and socks on without pain > 4/10  Goal status: ongoing still pain 7/10 04/24/23  PLAN:  PT FREQUENCY: 2x/week  PT DURATION: 12 weeks  PLANNED INTERVENTIONS: Therapeutic exercises, Therapeutic activity, Neuromuscular re-education, Balance training, Gait training, Patient/Family education, Self Care, Joint mobilization, Stair training, Electrical stimulation, Cryotherapy, Moist heat, scar mobilization, Vasopneumatic device, and Manual therapy  PLAN FOR NEXT SESSION: continue conservative until MRI   Patient Details  Name: [name] MRN: [id number] Date of Birth: 1961-01-21 Referring Provider:  Barnett Abu, MD  Encounter Date: 04/28/2023   Jearld Lesch, PT 04/28/2023, 3:29 PM

## 2023-04-29 ENCOUNTER — Ambulatory Visit: Payer: No Typology Code available for payment source | Admitting: Professional Counselor

## 2023-04-29 ENCOUNTER — Encounter: Payer: Self-pay | Admitting: Professional Counselor

## 2023-04-29 DIAGNOSIS — F331 Major depressive disorder, recurrent, moderate: Secondary | ICD-10-CM

## 2023-04-29 DIAGNOSIS — F411 Generalized anxiety disorder: Secondary | ICD-10-CM | POA: Diagnosis not present

## 2023-04-29 NOTE — Progress Notes (Signed)
      [facility] Counselor/Therapist Progress Note  Patient ID: [name], MRN: [id number],    Date: 04/29/2023  Time Spent: 4:13 PM to 5:10 PM  Treatment Type: Family with patient  Patient present with spouse for session.  Reported Symptoms: Difficulty concentrating, stress, sense of overwhelm, nervousness, restlessness, health concerns, interpersonal concerns  Mental Status Exam:  Appearance:   Neat     Behavior:  Appropriate, Sharing, and Motivated  Motor:  Normal  Speech/Language:   Clear and Coherent and Normal Rate  Affect:  Appropriate and Congruent  Mood:  anxious  Thought process:  normal  Thought content:    WNL  Sensory/Perceptual disturbances:    WNL  Orientation:  oriented to person, place, time/date, and situation  Attention:  Good  Concentration:  Good  Memory:  WNL  Fund of knowledge:   Good  Insight:    Good  Judgment:   Good  Impulse Control:  Good   Risk Assessment: Danger to Self:  vague SI no intent/plan Self-injurious Behavior: No Danger to Others: No Duty to Warn:no Physical Aggression / Violence:No  Access to Firearms a concern: No  Gang Involvement:No   Subjective: Patient presented to session to address concerns of anxiety and depression.  She reported mixed progress.  Patient reported she and spouse to have had a Firefighter meeting day prior, and for development to be positive as they made a step in the right direction of their goals for transparent finances, a factor that exacerbates patient anxiety.  Counselor affirmed patient and spouse motivation and activation toward shared goals.  Patient processed experience of her health concerns and pending diagnostic tests, and her fears and worries.  Counselor affirmed patient feelings and experience, and encouraged and reinforced patient strengths and resourcing, including revisiting of coping skills toolbox such as breath work and other self soothing techniques, support system out reach,  and spiritual resourcing.  Couple cited going to church together recently as helpful, and other faith study opportunities as well.  Counselor facilitated a "setting intention" mindfulness based stress reduction guided meditation, and encouraged couple to set alarms on their phones to remind themselves of their positive intention for just this evening to maximize practice of small measurable goal.  Interventions: Solution-Oriented/Positive Psychology, Humanistic/Existential, Insight-Oriented, Family Systems, and MBSR  Diagnosis:   ICD-10-CM   1. Generalized anxiety disorder  F41.1     2. Major depressive disorder, recurrent episode, moderate (HCC)  F33.1       Plan: Patient is scheduled for follow-up; continue process work and developing coping skills.  Short-term goal between sessions to practice living into intentions set during guided meditation from session; continue to practice coping skills for emotional regulation.  Progress note was dictated with Dragon and reviewed for accuracy.  Anthon Kins, [facility]

## 2023-05-01 ENCOUNTER — Encounter: Payer: Self-pay | Admitting: Physical Therapy

## 2023-05-01 ENCOUNTER — Ambulatory Visit: Admitting: Physical Therapy

## 2023-05-01 DIAGNOSIS — G8929 Other chronic pain: Secondary | ICD-10-CM

## 2023-05-01 DIAGNOSIS — Z96651 Presence of right artificial knee joint: Secondary | ICD-10-CM

## 2023-05-01 DIAGNOSIS — M6281 Muscle weakness (generalized): Secondary | ICD-10-CM

## 2023-05-01 DIAGNOSIS — M25661 Stiffness of right knee, not elsewhere classified: Secondary | ICD-10-CM

## 2023-05-01 DIAGNOSIS — M25561 Pain in right knee: Secondary | ICD-10-CM

## 2023-05-01 DIAGNOSIS — M549 Dorsalgia, unspecified: Secondary | ICD-10-CM | POA: Diagnosis not present

## 2023-05-01 NOTE — Therapy (Signed)
 OUTPATIENT PHYSICAL THERAPY LOWER EXTREMITY TREATMENT     Patient Name: [name] MRN: [id number] DOB:1960/06/10, 63 y.o., female Today's Date: 05/01/2023  END OF SESSION:  PT End of Session - 05/01/23 1529     Visit Number 77    Date for PT Re-Evaluation 05/18/23    Authorization Type [employer]    PT Start Time 1526    PT Stop Time 1615    PT Time Calculation (min) 49 min    Activity Tolerance Patient tolerated treatment well    Behavior During Therapy WFL for tasks assessed/performed              Past Medical History:  Diagnosis Date   Arthritis    Back pain of lumbar region with sciatica    Cancer (HCC)    breast   Depression    Diabetes mellitus, type II (HCC)    Family history of anesthesia complication    grandfather died under anesthesia 70's- had heart issues   H/O carpal tunnel repair 2014   Headache(784.0)    Hyperlipidemia    Hypertension    Hypothyroidism    Medial meniscus tear    PONV (postoperative nausea and vomiting)    Sleep apnea    cpap since 10   Past Surgical History:  Procedure Laterality Date   BILATERAL TOTAL MASTECTOMY WITH AXILLARY LYMPH NODE DISSECTION     BREAST RECONSTRUCTION     CERVICAL DISC ARTHROPLASTY N/A 03/19/2013   Procedure: CERVICAL FIVE TO SIX, CERVICAL SIX TO SEVEN CERVICAL ANTERIOR DISC ARTHROPLASTY;  Surgeon: Barnett Abu, MD;  Location: [facility];  Service: Neurosurgery;  Laterality: N/A;  C56 C67 artificial disc replacement   CESAREAN SECTION     COLONOSCOPY     DILATATION & CURETTAGE/HYSTEROSCOPY WITH TRUECLEAR N/A 11/25/2013   Procedure: DILATATION & CURETTAGE/HYSTEROSCOPY WITH TRUCLEAR;  Surgeon: Meriel Pica, MD;  Location: [facility];  Service: Gynecology;  Laterality: N/A;   ECTOPIC PREGNANCY SURGERY     GALLBLADDER SURGERY     MOUTH SURGERY     child- dog bite   SPINAL FUSION     2023   STERIOD INJECTION Left 09/09/2022   Procedure: LEFT KNEE STEROID INJECTION;  Surgeon: Ollen Gross, MD;   Location: [facility];  Service: Orthopedics;  Laterality: Left;  left knee injection 0928   TONSILLECTOMY     TOTAL KNEE ARTHROPLASTY Right 09/09/2022   Procedure: TOTAL KNEE ARTHROPLASTY;  Surgeon: Ollen Gross, MD;  Location: [facility];  Service: Orthopedics;  Laterality: Right;   TUBAL LIGATION     Patient Active Problem List   Diagnosis Date Noted   OA (osteoarthritis) of knee 09/09/2022   Exertional dyspnea 04/17/2022   Elevated coronary artery calcium score 04/17/2022   Spondylolisthesis at L4-L5 level 12/27/2021   Ductal carcinoma in situ (DCIS) of left breast 01/19/2018   Generalized anxiety disorder 11/07/2017   Depression 11/07/2017   Insomnia 11/07/2017   Cervical spondylosis 03/19/2013    PCP: Creola Corn  REFERRING PROVIDER: Trudee Grip  REFERRING DIAG: R TKA 09/09/22  THERAPY DIAG:  Chronic bilateral back pain, unspecified back location  Muscle weakness (generalized)  S/P total knee arthroplasty, right  Acute pain of right knee  Stiffness of right knee, not elsewhere classified  Rationale for Evaluation and Treatment: Rehabilitation  ONSET DATE: 09/09/22  SUBJECTIVE:   SUBJECTIVE STATEMENT :  Patient reports that she has MRI scheduled for April 24.  REports some right low back pain.  She says that she is  doing a little better pain 6/10  R TKA 8/19 L knee steroid injection 09/09/22  PAIN:  Are you having pain? Yes: NPRS scale: 7/10 Pain location: right low back, buttock and reports around to the front of the thigh Pain description: sharp really hurts Aggravating factors: difficulty sleeping, difficulty walking and standing and sitting Relieving factors: heat, Aleve , tramadol and methacarbomol  PRECAUTIONS: None  RED FLAGS: None   WEIGHT BEARING RESTRICTIONS: No  FALLS:  Has patient fallen in last 6 months? No  LIVING ENVIRONMENT: Lives with: lives with their family and lives with their spouse Lives in: House/apartment Stairs: Yes: External: 3  steps; none Has following equipment at home: Single point cane and Walker - 2 wheeled  OCCUPATION: Not working  PLOF: Independent  PATIENT GOALS: less back pain  NEXT MD VISIT: 09/24/22  OBJECTIVE:   PATIENT SURVEYS:  FOTO 4  COGNITION: Overall cognitive status: Within functional limits for tasks assessed      EDEMA:  Increased swelling surrounding R knee  POSTURE: rounded shoulders and flexed trunk   PALPATION: Warm and TTP  LOWER EXTREMITY ROM:  Active ROM Right eval Left eval Right 09/24/22 Right 09/26/22 Right 10/02/22  11/01/22 12/09/22  Hip flexion          Hip extension          Hip abduction          Hip adduction          Hip internal rotation          Hip external rotation          Knee flexion  60  75* AROM, 86 PROM 82* AROM seated per pt request  78* self AAROM, 76* AROM  PROM 87 AROM 81 PROM 90 AROM 83 AROM 101 PROM 105  Knee extension -30  -15* AROM seated, -15 PROM supine   -16* supine heel prop with quad set  AROM 25 PROM 16 AROM 14 AROM 5 PROM 0  Ankle dorsiflexion          Ankle plantarflexion          Ankle inversion          Ankle eversion           (Blank rows = not tested)  LOWER EXTREMITY MMT:  MMT Right eval Left eval  Hip flexion 2   Hip extension    Hip abduction    Hip adduction    Hip internal rotation    Hip external rotation    Knee flexion 2   Knee extension 2   Ankle dorsiflexion    Ankle plantarflexion    Ankle inversion    Ankle eversion     (Blank rows = not tested)  LUMBAR ROM:  decreased 50% flexion, decreased 75% for extension and side bending, pain was worse with extension and right side bending. Hooklying is less painful that lying flat on back Mild tightness with pain during HS stretch, tight and painful piriformis bilaterally worse on the right , very weak core FUNCTIONAL TESTS:  5 times sit to stand: 30 seconds 03/20/23 Timed up and go (TUG): 1 min 28s  GAIT: Is no without assistive device, mild antalgic  on the right    TODAY'S TREATMENT:  DATE:  05/01/23 Bike level 4 x  minute 5# straight arm pulls 5# AR press Hip extension no weight Hip abduction no weight Feet on ball K2C, trunk rotation, posterior isometrics and isometric abs STM to back and hips Manual sheet traction gently  04/28/23 Bike level 4 x 5 minutes with towel roll behind back 5# straight arm pulls cues for posture and core activation Feet on ball K2C, rotation, bridge, isometric abs Passive LE stretches STM to the right low back and buttock Tgun to above Manual sheet traction  04/24/23 Gait walking around the back building, she reported no increase of LBP but was already pain at a 7/10, she reports that she did not have to push on her back with her hands Feet on ball K2C, rotation, small bridge, isometric abs Passive LE stretches Passive gentle sheet lumbar traction STM with the T-gun to the lumbar area and the glutes  04/21/23 Gait outside around the parking [employer] level 4 x 5 minutes 5# straight arm pulls mild increase of LBP Feet on ball K2C, rotation, smaal posterior activation , isometric abs Passive LE stretches STM with the T-gun to the right low back and buttock on the right Manual pelvic traction  04/17/23 Gait outside descent pace, this does increase pain Bike level 3 x 5 minutes PROM LE's STM with the T-gun to the low back and buttock Gentle manual sheet traction  04/15/23 Nustep level 5 x 6 minutes Bike level 3 x 5 minutes Leg press 20# and no weight  5# straight arm pulls STM to the right low back and buttock Gentle manual pelvic traction with sheet  04/10/23 Sit ft pelvic ROM 4 way s 15 x each Nustep L 5 Red tband seated trunk ext 2 sets 10,10 x each trunk rotation Iso abs with ball 10 x hold 3 sec Feet on ball bridge, KTC and obl ADD ball squeeze Clams  and hip flex red tband 2 sets 10 Passive stretch to the LE's Very gentle manual sheet traction lumbar     04/08/23 STM with the Tgun to the right low back and right buttock Feet on ball K2C, small rotation, small bridge and isometric abs Passive stretch to the LE's Very gentle manual sheet traction lumbar  MHP/IFC to the right low back in supine feet elevated  04/03/23 Nustep L 5 6 min Sitting on dyna disc pelvic ROM 10x for pelvic ROM and stab Sitting on dyna disc for stab LAQ,hip flex,hip abd 10 x each Sitting on dyna disc shld row and ext red tband 2 sets 10 Supine LE stretches- HS, LTR, SKTC, piriformis, glutes RT LE NT stretching-positive MH/IFC RT LB supine   04/01/23 Walk outdoors  NuStep L5x37mins  Seated row 20# 2x10 Lat pull down 20# 2x10 BlackTB ext 2x10 Supine LE stretches- HS, LTR, SKTC, piriformis, glutes    03/26/23 NuStep L5 x 6 min Bridges LE on pball bridges x5, K2C x10, Oblq   Passive stretching to HS, piriformis, ITB, Glute Shoulder Ext 5lb x10 Standing rows 5lb x12  03/20/23 Evaluation of the lumbar spine order from Dr. Danielle Dess  PATIENT EDUCATION:  Education details: POC and HEP Person educated: Patient Education method: Explanation Education comprehension: verbalized understanding  HOME EXERCISE PROGRAM: Access Code: [facility] Access Code: [email or web address] URL: [email or web address] Date: 03/20/2023 Prepared by: Stacie Glaze  Exercises - Hooklying Single Knee to Chest Stretch  - 2 x daily - 7 x weekly - 1 sets - 10 reps - 5 hold - Supine Double  Knee to Chest  - 2 x daily - 7 x weekly - 1 sets - 10 reps - 5 hold - Supine Lower Trunk Rotation with PLB  - 2 x daily - 7 x weekly - 1 sets - 10 reps - 5 hold - Supine Posterior Pelvic Tilt  - 2 x daily - 7 x weekly - 1 sets - 10 reps - 3 hold  ASSESSMENT:  CLINICAL IMPRESSION:   Pt reports that she is feeling a little better pain down from 9/10 to 6/10. Trying to add some core stability  and flexibility.  She will have an MRI in 3 weeks.   She still reports relief with the manual sheet traction.  MD appointment is 05/30/23 OBJECTIVE IMPAIRMENTS: Abnormal gait, cardiopulmonary status limiting activity, decreased activity tolerance, decreased endurance, decreased mobility, difficulty walking, decreased ROM, decreased strength, increased muscle spasms, impaired flexibility, improper body mechanics, postural dysfunction, and pain.   REHAB POTENTIAL: Good  CLINICAL DECISION MAKING: Stable/uncomplicated  EVALUATION COMPLEXITY: Low   GOALS: Goals reviewed with patient? Yes  SHORT TERM GOALS: Target date: 10/24/22  Patient will be independent with initial HEP. Goal status: 09/20/22 met  2.  Patient will be do TUG < 30s Baseline: 1 min 28s  Goal status: ongoing 10/07/22, 18.57s MET 10/09/22  3.  Patient will be able to do 5 sit to stands  Baseline: unable to do  Goal status: progressing 10/04/22, MET 10/09/22   LONG TERM GOALS: Target date: 01/31/23  Patient will be independent with advanced/ongoing HEP to improve outcomes and carryover.  Goal status: ongoing 11/06/22  2.  Patient will report at least 75% improvement in R knee pain to improve QOL. Baseline: 10/10 Goal status: met 02/03/23 60% better 11/20/22, 65% 12/06/22, 70% 12/17/22  3.  Patient will demonstrate improved R knee AROM to >/= 0-120 deg to allow for normal gait and stair mechanics. Baseline: 30-0-60 10/09/22: 16-0-78  11/01/22: 14-0-83 11/20/22: 10-97 12/06/22: 10-0-100 12/17/22: 8-0-104 Goal status: continue to progress and getting closer 0-112  03/13/23  4.  Patient will demonstrate improved functional LE strength as demonstrated by 5/5. Baseline: 2/5 Goal status: MET 3+/5 10/09/22, 5/5 11/01/22   5.  Patient will be able to ambulate 500' with LRAD and normal gait pattern without increased pain to access community.  Baseline: using RW small in home ambulation distances Goal status: progressing w/SPC  12/02/22, doing some walking without AD   6.  Patient will report 60 on FOTO (patient reported outcome measure) to demonstrate improved functional ability. Baseline: 4, 40-11/11/24 Goal status: MET 53 12/17/22  7.  Decrease lumbar pain 50%  Goal status: ongoing 05/01/23  8.  Increase lumbar ROM 25%  Goal status: ongoing 04/24/23  9.  Report able to put shoes and socks on without pain > 4/10  Goal status: ongoing still pain 7/10 05/01/23  PLAN:  PT FREQUENCY: 2x/week  PT DURATION: 12 weeks  PLANNED INTERVENTIONS: Therapeutic exercises, Therapeutic activity, Neuromuscular re-education, Balance training, Gait training, Patient/Family education, Self Care, Joint mobilization, Stair training, Electrical stimulation, Cryotherapy, Moist heat, scar mobilization, Vasopneumatic device, and Manual therapy  PLAN FOR NEXT SESSION: continue conservative until MRI   Patient Details  Name: [name] MRN: [id number] Date of Birth: [birth year] Referring Provider:  Barnett Abu, MD  Encounter Date: 05/01/2023   Jearld Lesch, PT 05/01/2023, 3:30 PM

## 2023-05-02 ENCOUNTER — Other Ambulatory Visit (HOSPITAL_COMMUNITY): Payer: Self-pay | Admitting: Surgery

## 2023-05-02 DIAGNOSIS — N6451 Induration of breast: Secondary | ICD-10-CM

## 2023-05-02 DIAGNOSIS — Z853 Personal history of malignant neoplasm of breast: Secondary | ICD-10-CM

## 2023-05-02 DIAGNOSIS — Z9013 Acquired absence of bilateral breasts and nipples: Secondary | ICD-10-CM

## 2023-05-05 ENCOUNTER — Encounter: Payer: Self-pay | Admitting: Physical Therapy

## 2023-05-05 ENCOUNTER — Ambulatory Visit: Admitting: Physical Therapy

## 2023-05-05 DIAGNOSIS — M549 Dorsalgia, unspecified: Secondary | ICD-10-CM | POA: Diagnosis not present

## 2023-05-05 DIAGNOSIS — R600 Localized edema: Secondary | ICD-10-CM

## 2023-05-05 DIAGNOSIS — Z96651 Presence of right artificial knee joint: Secondary | ICD-10-CM

## 2023-05-05 DIAGNOSIS — M25661 Stiffness of right knee, not elsewhere classified: Secondary | ICD-10-CM

## 2023-05-05 DIAGNOSIS — G8929 Other chronic pain: Secondary | ICD-10-CM

## 2023-05-05 DIAGNOSIS — M25561 Pain in right knee: Secondary | ICD-10-CM

## 2023-05-05 DIAGNOSIS — M6281 Muscle weakness (generalized): Secondary | ICD-10-CM

## 2023-05-05 NOTE — Therapy (Signed)
 OUTPATIENT PHYSICAL THERAPY LOWER EXTREMITY TREATMENT     Patient Name: [name] MRN: [id number] DOB:05/31/1960, 63 y.o., female Today's Date: 05/05/2023  END OF SESSION:  PT End of Session - 05/05/23 1523     Visit Number 78    Date for PT Re-Evaluation 05/18/23    Authorization Type [employer]    PT Start Time 1524    PT Stop Time 1610    PT Time Calculation (min) 46 min    Activity Tolerance Patient tolerated treatment well    Behavior During Therapy WFL for tasks assessed/performed              Past Medical History:  Diagnosis Date   Arthritis    Back pain of lumbar region with sciatica    Cancer (HCC)    breast   Depression    Diabetes mellitus, type II (HCC)    Family history of anesthesia complication    grandfather died under anesthesia 70's- had heart issues   H/O carpal tunnel repair 2014   Headache(784.0)    Hyperlipidemia    Hypertension    Hypothyroidism    Medial meniscus tear    PONV (postoperative nausea and vomiting)    Sleep apnea    cpap since 10   Past Surgical History:  Procedure Laterality Date   BILATERAL TOTAL MASTECTOMY WITH AXILLARY LYMPH NODE DISSECTION     BREAST RECONSTRUCTION     CERVICAL DISC ARTHROPLASTY N/A 03/19/2013   Procedure: CERVICAL FIVE TO SIX, CERVICAL SIX TO SEVEN CERVICAL ANTERIOR DISC ARTHROPLASTY;  Surgeon: Barnett Abu, MD;  Location: [facility];  Service: Neurosurgery;  Laterality: N/A;  C56 C67 artificial disc replacement   CESAREAN SECTION     COLONOSCOPY     DILATATION & CURETTAGE/HYSTEROSCOPY WITH TRUECLEAR N/A 11/25/2013   Procedure: DILATATION & CURETTAGE/HYSTEROSCOPY WITH TRUCLEAR;  Surgeon: Meriel Pica, MD;  Location: [facility];  Service: Gynecology;  Laterality: N/A;   ECTOPIC PREGNANCY SURGERY     GALLBLADDER SURGERY     MOUTH SURGERY     child- dog bite   SPINAL FUSION     2023   STERIOD INJECTION Left 09/09/2022   Procedure: LEFT KNEE STEROID INJECTION;  Surgeon: Ollen Gross, MD;   Location: [facility];  Service: Orthopedics;  Laterality: Left;  left knee injection 0928   TONSILLECTOMY     TOTAL KNEE ARTHROPLASTY Right 09/09/2022   Procedure: TOTAL KNEE ARTHROPLASTY;  Surgeon: Ollen Gross, MD;  Location: [facility];  Service: Orthopedics;  Laterality: Right;   TUBAL LIGATION     Patient Active Problem List   Diagnosis Date Noted   OA (osteoarthritis) of knee 09/09/2022   Exertional dyspnea 04/17/2022   Elevated coronary artery calcium score 04/17/2022   Spondylolisthesis at L4-L5 level 12/27/2021   Ductal carcinoma in situ (DCIS) of left breast 01/19/2018   Generalized anxiety disorder 11/07/2017   Depression 11/07/2017   Insomnia 11/07/2017   Cervical spondylosis 03/19/2013    PCP: Creola Corn  REFERRING PROVIDER: Trudee Grip  REFERRING DIAG: R TKA 09/09/22  THERAPY DIAG:  Chronic bilateral back pain, unspecified back location  Muscle weakness (generalized)  S/P total knee arthroplasty, right  Acute pain of right knee  Stiffness of right knee, not elsewhere classified  Localized edema  Rationale for Evaluation and Treatment: Rehabilitation  ONSET DATE: 09/09/22  SUBJECTIVE:   SUBJECTIVE STATEMENT :  Patient reports that she has MRI scheduled for April 24.  Still hurting in the back, stiff in the knee  R TKA 8/19 L knee steroid injection 09/09/22  PAIN:  Are you having pain? Yes: NPRS scale: 7/10 Pain location: right low back, buttock and reports around to the front of the thigh Pain description: sharp really hurts Aggravating factors: difficulty sleeping, difficulty walking and standing and sitting Relieving factors: heat, Aleve , tramadol and methacarbomol  PRECAUTIONS: None  RED FLAGS: None   WEIGHT BEARING RESTRICTIONS: No  FALLS:  Has patient fallen in last 6 months? No  LIVING ENVIRONMENT: Lives with: lives with their family and lives with their spouse Lives in: House/apartment Stairs: Yes: External: 3 steps; none Has  following equipment at home: Single point cane and Walker - 2 wheeled  OCCUPATION: Not working  PLOF: Independent  PATIENT GOALS: less back pain  NEXT MD VISIT: 09/24/22  OBJECTIVE:   PATIENT SURVEYS:  FOTO 4  COGNITION: Overall cognitive status: Within functional limits for tasks assessed      EDEMA:  Increased swelling surrounding R knee  POSTURE: rounded shoulders and flexed trunk   PALPATION: Warm and TTP  LOWER EXTREMITY ROM:  Active ROM Right eval Left eval Right 09/24/22 Right 09/26/22 Right 10/02/22  11/01/22 12/09/22  Hip flexion          Hip extension          Hip abduction          Hip adduction          Hip internal rotation          Hip external rotation          Knee flexion  60  75* AROM, 86 PROM 82* AROM seated per pt request  78* self AAROM, 76* AROM  PROM 87 AROM 81 PROM 90 AROM 83 AROM 101 PROM 105  Knee extension -30  -15* AROM seated, -15 PROM supine   -16* supine heel prop with quad set  AROM 25 PROM 16 AROM 14 AROM 5 PROM 0  Ankle dorsiflexion          Ankle plantarflexion          Ankle inversion          Ankle eversion           (Blank rows = not tested)  LOWER EXTREMITY MMT:  MMT Right eval Left eval  Hip flexion 2   Hip extension    Hip abduction    Hip adduction    Hip internal rotation    Hip external rotation    Knee flexion 2   Knee extension 2   Ankle dorsiflexion    Ankle plantarflexion    Ankle inversion    Ankle eversion     (Blank rows = not tested)  LUMBAR ROM:  decreased 50% flexion, decreased 75% for extension and side bending, pain was worse with extension and right side bending. Hooklying is less painful that lying flat on back Mild tightness with pain during HS stretch, tight and painful piriformis bilaterally worse on the right , very weak core FUNCTIONAL TESTS:  5 times sit to stand: 30 seconds 03/20/23 Timed up and go (TUG): 1 min 28s  GAIT: Is no without assistive device, mild antalgic on the right     TODAY'S TREATMENT:  DATE:  05/05/23 Bike level 5 x 6 minutes Gait outside around the parking [place] 5# straight arm pulls 15# lats Feet on ball K2C, rotation, bridges, isometric abs Manual sheet traction STM to the right low back and buttock  05/01/23 Bike level 4 x  minute 5# straight arm pulls 5# AR press Hip extension no weight Hip abduction no weight Feet on ball K2C, trunk rotation, posterior isometrics and isometric abs STM to back and hips Manual sheet traction gently  04/28/23 Bike level 4 x 5 minutes with towel roll behind back 5# straight arm pulls cues for posture and core activation Feet on ball K2C, rotation, bridge, isometric abs Passive LE stretches STM to the right low back and buttock Tgun to above Manual sheet traction  04/24/23 Gait walking around the back building, she reported no increase of LBP but was already pain at a 7/10, she reports that she did not have to push on her back with her hands Feet on ball K2C, rotation, small bridge, isometric abs Passive LE stretches Passive gentle sheet lumbar traction STM with the T-gun to the lumbar area and the glutes  04/21/23 Gait outside around the parking [employer] level 4 x 5 minutes 5# straight arm pulls mild increase of LBP Feet on ball K2C, rotation, smaal posterior activation , isometric abs Passive LE stretches STM with the T-gun to the right low back and buttock on the right Manual pelvic traction  04/17/23 Gait outside descent pace, this does increase pain Bike level 3 x 5 minutes PROM LE's STM with the T-gun to the low back and buttock Gentle manual sheet traction  04/15/23 Nustep level 5 x 6 minutes Bike level 3 x 5 minutes Leg press 20# and no weight  5# straight arm pulls STM to the right low back and buttock Gentle manual pelvic traction with  sheet  04/10/23 Sit ft pelvic ROM 4 way s 15 x each Nustep L 5 Red tband seated trunk ext 2 sets 10,10 x each trunk rotation Iso abs with ball 10 x hold 3 sec Feet on ball bridge, KTC and obl ADD ball squeeze Clams and hip flex red tband 2 sets 10 Passive stretch to the LE's Very gentle manual sheet traction lumbar     04/08/23 STM with the Tgun to the right low back and right buttock Feet on ball K2C, small rotation, small bridge and isometric abs Passive stretch to the LE's Very gentle manual sheet traction lumbar  MHP/IFC to the right low back in supine feet elevated  04/03/23 Nustep L 5 6 min Sitting on dyna disc pelvic ROM 10x for pelvic ROM and stab Sitting on dyna disc for stab LAQ,hip flex,hip abd 10 x each Sitting on dyna disc shld row and ext red tband 2 sets 10 Supine LE stretches- HS, LTR, SKTC, piriformis, glutes RT LE NT stretching-positive MH/IFC RT LB supine   04/01/23 Walk outdoors  NuStep L5x47mins  Seated row 20# 2x10 Lat pull down 20# 2x10 BlackTB ext 2x10 Supine LE stretches- HS, LTR, SKTC, piriformis, glutes    03/26/23 NuStep L5 x 6 min Bridges LE on pball bridges x5, K2C x10, Oblq   Passive stretching to HS, piriformis, ITB, Glute Shoulder Ext 5lb x10 Standing rows 5lb x12  03/20/23 Evaluation of the lumbar spine order from Dr. Ellery Guthrie  PATIENT EDUCATION:  Education details: POC and HEP Person educated: Patient Education method: Explanation Education comprehension: verbalized understanding  HOME EXERCISE PROGRAM: Access Code: [email or web address] Access Code:  [email or web address] URL: [email or web address] Date: 03/20/2023 Prepared by: Cherylene Corrente  Exercises - Hooklying Single Knee to Chest Stretch  - 2 x daily - 7 x weekly - 1 sets - 10 reps - 5 hold - Supine Double Knee to Chest  - 2 x daily - 7 x weekly - 1 sets - 10 reps - 5 hold - Supine Lower Trunk Rotation with PLB  - 2 x daily - 7 x weekly - 1 sets - 10 reps - 5 hold - Supine  Posterior Pelvic Tilt  - 2 x daily - 7 x weekly - 1 sets - 10 reps - 3 hold  ASSESSMENT:  CLINICAL IMPRESSION:   Pt reports that she is feeling a little better pain down from 9/10 to 6/10. Trying to add some core stability and flexibility.  She reports that she is stiff today, feels like the STM and the traction are still working and giving some relief OBJECTIVE IMPAIRMENTS: Abnormal gait, cardiopulmonary status limiting activity, decreased activity tolerance, decreased endurance, decreased mobility, difficulty walking, decreased ROM, decreased strength, increased muscle spasms, impaired flexibility, improper body mechanics, postural dysfunction, and pain.   REHAB POTENTIAL: Good  CLINICAL DECISION MAKING: Stable/uncomplicated  EVALUATION COMPLEXITY: Low   GOALS: Goals reviewed with patient? Yes  SHORT TERM GOALS: Target date: 10/24/22  Patient will be independent with initial HEP. Goal status: 09/20/22 met  2.  Patient will be do TUG < 30s Baseline: 1 min 28s  Goal status: ongoing 10/07/22, 18.57s MET 10/09/22  3.  Patient will be able to do 5 sit to stands  Baseline: unable to do  Goal status: progressing 10/04/22, MET 10/09/22   LONG TERM GOALS: Target date: 01/31/23  Patient will be independent with advanced/ongoing HEP to improve outcomes and carryover.  Goal status: ongoing 11/06/22  2.  Patient will report at least 75% improvement in R knee pain to improve QOL. Baseline: 10/10 Goal status: met 02/03/23 60% better 11/20/22, 65% 12/06/22, 70% 12/17/22  3.  Patient will demonstrate improved R knee AROM to >/= 0-120 deg to allow for normal gait and stair mechanics. Baseline: 30-0-60 10/09/22: 16-0-78  11/01/22: 14-0-83 11/20/22: 10-97 12/06/22: 10-0-100 12/17/22: 8-0-104 Goal status: continue to progress and getting closer 0-112  03/13/23  4.  Patient will demonstrate improved functional LE strength as demonstrated by 5/5. Baseline: 2/5 Goal status: MET 3+/5 10/09/22, 5/5  11/01/22   5.  Patient will be able to ambulate 500' with LRAD and normal gait pattern without increased pain to access community.  Baseline: using RW small in home ambulation distances Goal status: progressing w/SPC 12/02/22, doing some walking without AD   6.  Patient will report 64 on FOTO (patient reported outcome measure) to demonstrate improved functional ability. Baseline: 4, 40-11/11/24 Goal status: MET 53 12/17/22  7.  Decrease lumbar pain 50%  Goal status: ongoing 05/01/23  8.  Increase lumbar ROM 25%  Goal status: ongoing 04/24/23  9.  Report able to put shoes and socks on without pain > 4/10  Goal status: ongoing still pain 7/10 05/01/23  PLAN:  PT FREQUENCY: 2x/week  PT DURATION: 12 weeks  PLANNED INTERVENTIONS: Therapeutic exercises, Therapeutic activity, Neuromuscular re-education, Balance training, Gait training, Patient/Family education, Self Care, Joint mobilization, Stair training, Electrical stimulation, Cryotherapy, Moist heat, scar mobilization, Vasopneumatic device, and Manual therapy  PLAN FOR NEXT SESSION: continue conservative until MRI   Patient Details  Name: [name] MRN: [id number] Date of Birth: [birth year] Referring Provider:  Elna Haggis, MD  Encounter  Date: 05/05/2023   Hollis Lurie, PT 05/05/2023, 3:24 PM

## 2023-05-06 ENCOUNTER — Ambulatory Visit: Payer: No Typology Code available for payment source | Admitting: Professional Counselor

## 2023-05-07 ENCOUNTER — Encounter: Payer: Self-pay | Admitting: Physical Therapy

## 2023-05-07 ENCOUNTER — Ambulatory Visit: Admitting: Physical Therapy

## 2023-05-07 DIAGNOSIS — Z96651 Presence of right artificial knee joint: Secondary | ICD-10-CM

## 2023-05-07 DIAGNOSIS — R600 Localized edema: Secondary | ICD-10-CM

## 2023-05-07 DIAGNOSIS — M25561 Pain in right knee: Secondary | ICD-10-CM

## 2023-05-07 DIAGNOSIS — G8929 Other chronic pain: Secondary | ICD-10-CM

## 2023-05-07 DIAGNOSIS — M25661 Stiffness of right knee, not elsewhere classified: Secondary | ICD-10-CM

## 2023-05-07 DIAGNOSIS — M6281 Muscle weakness (generalized): Secondary | ICD-10-CM

## 2023-05-07 DIAGNOSIS — M549 Dorsalgia, unspecified: Secondary | ICD-10-CM | POA: Diagnosis not present

## 2023-05-07 NOTE — Therapy (Signed)
 OUTPATIENT PHYSICAL THERAPY LOWER EXTREMITY TREATMENT     Patient Name: [name] MRN: [id number] DOB:05/31/1960, 63 y.o., female Today's Date: 05/07/2023  END OF SESSION:  PT End of Session - 05/07/23 1321     Visit Number 79    Date for PT Re-Evaluation 05/18/23    Authorization Type [employer]    PT Start Time 1315    PT Stop Time 1400    PT Time Calculation (min) 45 min    Activity Tolerance Patient tolerated treatment well    Behavior During Therapy WFL for tasks assessed/performed              Past Medical History:  Diagnosis Date   Arthritis    Back pain of lumbar region with sciatica    Cancer (HCC)    breast   Depression    Diabetes mellitus, type II (HCC)    Family history of anesthesia complication    grandfather died under anesthesia 70's- had heart issues   H/O carpal tunnel repair 2014   Headache(784.0)    Hyperlipidemia    Hypertension    Hypothyroidism    Medial meniscus tear    PONV (postoperative nausea and vomiting)    Sleep apnea    cpap since 10   Past Surgical History:  Procedure Laterality Date   BILATERAL TOTAL MASTECTOMY WITH AXILLARY LYMPH NODE DISSECTION     BREAST RECONSTRUCTION     CERVICAL DISC ARTHROPLASTY N/A 03/19/2013   Procedure: CERVICAL FIVE TO SIX, CERVICAL SIX TO SEVEN CERVICAL ANTERIOR DISC ARTHROPLASTY;  Surgeon: Barnett Abu, MD;  Location: [facility];  Service: Neurosurgery;  Laterality: N/A;  C56 C67 artificial disc replacement   CESAREAN SECTION     COLONOSCOPY     DILATATION & CURETTAGE/HYSTEROSCOPY WITH TRUECLEAR N/A 11/25/2013   Procedure: DILATATION & CURETTAGE/HYSTEROSCOPY WITH TRUCLEAR;  Surgeon: Meriel Pica, MD;  Location: [facility];  Service: Gynecology;  Laterality: N/A;   ECTOPIC PREGNANCY SURGERY     GALLBLADDER SURGERY     MOUTH SURGERY     child- dog bite   SPINAL FUSION     2023   STERIOD INJECTION Left 09/09/2022   Procedure: LEFT KNEE STEROID INJECTION;  Surgeon: Ollen Gross, MD;   Location: [facility];  Service: Orthopedics;  Laterality: Left;  left knee injection 0928   TONSILLECTOMY     TOTAL KNEE ARTHROPLASTY Right 09/09/2022   Procedure: TOTAL KNEE ARTHROPLASTY;  Surgeon: Ollen Gross, MD;  Location: [facility];  Service: Orthopedics;  Laterality: Right;   TUBAL LIGATION     Patient Active Problem List   Diagnosis Date Noted   OA (osteoarthritis) of knee 09/09/2022   Exertional dyspnea 04/17/2022   Elevated coronary artery calcium score 04/17/2022   Spondylolisthesis at L4-L5 level 12/27/2021   Ductal carcinoma in situ (DCIS) of left breast 01/19/2018   Generalized anxiety disorder 11/07/2017   Depression 11/07/2017   Insomnia 11/07/2017   Cervical spondylosis 03/19/2013    PCP: Creola Corn  REFERRING PROVIDER: Trudee Grip  REFERRING DIAG: R TKA 09/09/22  THERAPY DIAG:  Chronic bilateral back pain, unspecified back location  Muscle weakness (generalized)  S/P total knee arthroplasty, right  Acute pain of right knee  Stiffness of right knee, not elsewhere classified  Localized edema  Rationale for Evaluation and Treatment: Rehabilitation  ONSET DATE: 09/09/22  SUBJECTIVE:   SUBJECTIVE STATEMENT :  Patient reports that she has MRI scheduled for April 24.  Seeing MD's over the next few weeks  R  TKA 8/19 L knee steroid injection 09/09/22  PAIN:  Are you having pain? Yes: NPRS scale: 6/10 Pain location: right low back, buttock and reports around to the front of the thigh Pain description: sharp really hurts Aggravating factors: difficulty sleeping, difficulty walking and standing and sitting Relieving factors: heat, Aleve , tramadol and methacarbomol  PRECAUTIONS: None  RED FLAGS: None   WEIGHT BEARING RESTRICTIONS: No  FALLS:  Has patient fallen in last 6 months? No  LIVING ENVIRONMENT: Lives with: lives with their family and lives with their spouse Lives in: House/apartment Stairs: Yes: External: 3 steps; none Has following  equipment at home: Single point cane and Walker - 2 wheeled  OCCUPATION: Not working  PLOF: Independent  PATIENT GOALS: less back pain  NEXT MD VISIT: 09/24/22  OBJECTIVE:   PATIENT SURVEYS:  FOTO 4  COGNITION: Overall cognitive status: Within functional limits for tasks assessed      EDEMA:  Increased swelling surrounding R knee  POSTURE: rounded shoulders and flexed trunk   PALPATION: Warm and TTP  LOWER EXTREMITY ROM:  Active ROM Right eval Left eval Right 09/24/22 Right 09/26/22 Right 10/02/22  11/01/22 12/09/22  Hip flexion          Hip extension          Hip abduction          Hip adduction          Hip internal rotation          Hip external rotation          Knee flexion  60  75* AROM, 86 PROM 82* AROM seated per pt request  78* self AAROM, 76* AROM  PROM 87 AROM 81 PROM 90 AROM 83 AROM 101 PROM 105  Knee extension -30  -15* AROM seated, -15 PROM supine   -16* supine heel prop with quad set  AROM 25 PROM 16 AROM 14 AROM 5 PROM 0  Ankle dorsiflexion          Ankle plantarflexion          Ankle inversion          Ankle eversion           (Blank rows = not tested)  LOWER EXTREMITY MMT:  MMT Right eval Left eval  Hip flexion 2   Hip extension    Hip abduction    Hip adduction    Hip internal rotation    Hip external rotation    Knee flexion 2   Knee extension 2   Ankle dorsiflexion    Ankle plantarflexion    Ankle inversion    Ankle eversion     (Blank rows = not tested)  LUMBAR ROM:  decreased 50% flexion, decreased 75% for extension and side bending, pain was worse with extension and right side bending. Hooklying is less painful that lying flat on back Mild tightness with pain during HS stretch, tight and painful piriformis bilaterally worse on the right , very weak core FUNCTIONAL TESTS:  5 times sit to stand: 30 seconds 03/20/23 Timed up and go (TUG): 1 min 28s  GAIT: Is no without assistive device, mild antalgic on the right    TODAY'S  TREATMENT:  DATE:  05/07/23 Bike level 5 x 6 minutes 5# straight arm pulls 5 # hip extension some pain Feet on ball K2C, rotation, small bridge, isometric abs Manual sheet traction STM with the Tgun in the right low back and buttock  05/05/23 Bike level 5 x 6 minutes Gait outside around the parking [place] 5# straight arm pulls 15# lats Feet on ball K2C, rotation, bridges, isometric abs Manual sheet traction STM to the right low back and buttock  05/01/23 Bike level 4 x  minute 5# straight arm pulls 5# AR press Hip extension no weight Hip abduction no weight Feet on ball K2C, trunk rotation, posterior isometrics and isometric abs STM to back and hips Manual sheet traction gently  04/28/23 Bike level 4 x 5 minutes with towel roll behind back 5# straight arm pulls cues for posture and core activation Feet on ball K2C, rotation, bridge, isometric abs Passive LE stretches STM to the right low back and buttock Tgun to above Manual sheet traction  04/24/23 Gait walking around the back building, she reported no increase of LBP but was already pain at a 7/10, she reports that she did not have to push on her back with her hands Feet on ball K2C, rotation, small bridge, isometric abs Passive LE stretches Passive gentle sheet lumbar traction STM with the T-gun to the lumbar area and the glutes  04/21/23 Gait outside around the parking [employer] level 4 x 5 minutes 5# straight arm pulls mild increase of LBP Feet on ball K2C, rotation, smaal posterior activation , isometric abs Passive LE stretches STM with the T-gun to the right low back and buttock on the right Manual pelvic traction  04/17/23 Gait outside descent pace, this does increase pain Bike level 3 x 5 minutes PROM LE's STM with the T-gun to the low back and buttock Gentle manual sheet  traction  04/15/23 Nustep level 5 x 6 minutes Bike level 3 x 5 minutes Leg press 20# and no weight  5# straight arm pulls STM to the right low back and buttock Gentle manual pelvic traction with sheet  04/10/23 Sit ft pelvic ROM 4 way s 15 x each Nustep L 5 Red tband seated trunk ext 2 sets 10,10 x each trunk rotation Iso abs with ball 10 x hold 3 sec Feet on ball bridge, KTC and obl ADD ball squeeze Clams and hip flex red tband 2 sets 10 Passive stretch to the LE's Very gentle manual sheet traction lumbar   04/08/23 STM with the Tgun to the right low back and right buttock Feet on ball K2C, small rotation, small bridge and isometric abs Passive stretch to the LE's Very gentle manual sheet traction lumbar  MHP/IFC to the right low back in supine feet elevated  04/03/23 Nustep L 5 6 min Sitting on dyna disc pelvic ROM 10x for pelvic ROM and stab Sitting on dyna disc for stab LAQ,hip flex,hip abd 10 x each Sitting on dyna disc shld row and ext red tband 2 sets 10 Supine LE stretches- HS, LTR, SKTC, piriformis, glutes RT LE NT stretching-positive MH/IFC RT LB supine   04/01/23 Walk outdoors  NuStep L5x13mins  Seated row 20# 2x10 Lat pull down 20# 2x10 BlackTB ext 2x10 Supine LE stretches- HS, LTR, SKTC, piriformis, glutes    03/26/23 NuStep L5 x 6 min Bridges LE on pball bridges x5, K2C x10, Oblq   Passive stretching to HS, piriformis, ITB, Glute Shoulder Ext 5lb x10 Standing rows 5lb x12  03/20/23 Evaluation of the lumbar spine order from Dr. Ellery Guthrie  PATIENT EDUCATION:  Education details: POC and HEP Person educated: Patient Education method: Explanation Education comprehension: verbalized understanding  HOME EXERCISE PROGRAM: Access Code: [facility] Access Code: [email or web address] URL: [email or web address] Date: 03/20/2023 Prepared by: Cherylene Corrente  Exercises - Hooklying Single Knee to Chest Stretch  - 2 x daily - 7 x weekly - 1 sets - 10 reps  - 5 hold - Supine Double Knee to Chest  - 2 x daily - 7 x weekly - 1 sets - 10 reps - 5 hold - Supine Lower Trunk Rotation with PLB  - 2 x daily - 7 x weekly - 1 sets - 10 reps - 5 hold - Supine Posterior Pelvic Tilt  - 2 x daily - 7 x weekly - 1 sets - 10 reps - 3 hold  ASSESSMENT:  CLINICAL IMPRESSION:   Pt reports that she is feeling a little better pain down from 9/10 to 6/10. Trying to add some core stability and flexibility, she had pain with the hip extension while standing on the right leg.    She will be having MRI's and MD visits in the next few weeks, she continues to report some relief with the sheet traction and the STM OBJECTIVE IMPAIRMENTS: Abnormal gait, cardiopulmonary status limiting activity, decreased activity tolerance, decreased endurance, decreased mobility, difficulty walking, decreased ROM, decreased strength, increased muscle spasms, impaired flexibility, improper body mechanics, postural dysfunction, and pain.   REHAB POTENTIAL: Good  CLINICAL DECISION MAKING: Stable/uncomplicated  EVALUATION COMPLEXITY: Low   GOALS: Goals reviewed with patient? Yes  SHORT TERM GOALS: Target date: 10/24/22  Patient will be independent with initial HEP. Goal status: 09/20/22 met  2.  Patient will be do TUG < 30s Baseline: 1 min 28s  Goal status: ongoing 10/07/22, 18.57s MET 10/09/22  3.  Patient will be able to do 5 sit to stands  Baseline: unable to do  Goal status: progressing 10/04/22, MET 10/09/22   LONG TERM GOALS: Target date: 01/31/23  Patient will be independent with advanced/ongoing HEP to improve outcomes and carryover.  Goal status: ongoing 11/06/22  2.  Patient will report at least 75% improvement in R knee pain to improve QOL. Baseline: 10/10 Goal status: met 02/03/23 60% better 11/20/22, 65% 12/06/22, 70% 12/17/22  3.  Patient will demonstrate improved R knee AROM to >/= 0-120 deg to allow for normal gait and stair mechanics. Baseline: 30-0-60 10/09/22:  16-0-78  11/01/22: 14-0-83 11/20/22: 10-97 12/06/22: 10-0-100 12/17/22: 8-0-104 Goal status: continue to progress and getting closer 0-112  03/13/23  4.  Patient will demonstrate improved functional LE strength as demonstrated by 5/5. Baseline: 2/5 Goal status: MET 3+/5 10/09/22, 5/5 11/01/22   5.  Patient will be able to ambulate 500' with LRAD and normal gait pattern without increased pain to access community.  Baseline: using RW small in home ambulation distances Goal status: progressing w/SPC 12/02/22, doing some walking without AD   6.  Patient will report 44 on FOTO (patient reported outcome measure) to demonstrate improved functional ability. Baseline: 4, 40-11/11/24 Goal status: MET 53 12/17/22  7.  Decrease lumbar pain 50%  Goal status: ongoing 05/01/23  8.  Increase lumbar ROM 25%  Goal status: ongoing 04/24/23  9.  Report able to put shoes and socks on without pain > 4/10  Goal status: ongoing still pain 7/10 05/01/23  PLAN:  PT FREQUENCY: 2x/week  PT DURATION: 12 weeks  PLANNED INTERVENTIONS: Therapeutic exercises, Therapeutic activity,  Neuromuscular re-education, Balance training, Gait training, Patient/Family education, Self Care, Joint mobilization, Stair training, Electrical stimulation, Cryotherapy, Moist heat, scar mobilization, Vasopneumatic device, and Manual therapy  PLAN FOR NEXT SESSION: continue conservative until MRI   Patient Details  Name: [name] MRN: [id number] Date of Birth: Dec 29, 1960 Referring Provider:  Elna Haggis, MD  Encounter Date: 05/07/2023   Hollis Lurie, PT 05/07/2023, 1:23 PM

## 2023-05-12 ENCOUNTER — Ambulatory Visit (HOSPITAL_COMMUNITY)
Admission: RE | Admit: 2023-05-12 | Discharge: 2023-05-12 | Disposition: A | Discharge status: Home / Self Care | Source: Ambulatory Visit | Attending: Surgery | Admitting: Surgery

## 2023-05-12 DIAGNOSIS — Z9013 Acquired absence of bilateral breasts and nipples: Secondary | ICD-10-CM | POA: Insufficient documentation

## 2023-05-12 DIAGNOSIS — Z853 Personal history of malignant neoplasm of breast: Secondary | ICD-10-CM | POA: Diagnosis present

## 2023-05-12 DIAGNOSIS — N6451 Induration of breast: Secondary | ICD-10-CM | POA: Insufficient documentation

## 2023-05-12 MED ORDER — GADOBUTROL 1 MMOL/ML IV SOLN
8.0000 mL | Freq: Once | INTRAVENOUS | Status: AC | PRN
  Administered 2023-05-12: 8 mL via INTRAVENOUS

## 2023-05-13 ENCOUNTER — Ambulatory Visit

## 2023-05-13 ENCOUNTER — Ambulatory Visit: Payer: No Typology Code available for payment source | Admitting: Professional Counselor

## 2023-05-13 ENCOUNTER — Encounter: Payer: Self-pay | Admitting: Professional Counselor

## 2023-05-13 DIAGNOSIS — M6281 Muscle weakness (generalized): Secondary | ICD-10-CM

## 2023-05-13 DIAGNOSIS — F411 Generalized anxiety disorder: Secondary | ICD-10-CM

## 2023-05-13 DIAGNOSIS — F331 Major depressive disorder, recurrent, moderate: Secondary | ICD-10-CM

## 2023-05-13 DIAGNOSIS — G8929 Other chronic pain: Secondary | ICD-10-CM

## 2023-05-13 DIAGNOSIS — M549 Dorsalgia, unspecified: Secondary | ICD-10-CM | POA: Diagnosis not present

## 2023-05-13 NOTE — Therapy (Signed)
 OUTPATIENT PHYSICAL THERAPY LOWER EXTREMITY TREATMENT   Progress Note Reporting Period 04/15/23 to 05/13/23  See note below for Objective Data and Assessment of Progress/Goals.      Patient Name: [name] MRN: [id number] DOB:May 15, 1960, 63 y.o., female Today's Date: 05/13/2023  END OF SESSION:  PT End of Session - 05/13/23 1230     Visit Number 80    Date for PT Re-Evaluation 05/18/23    Authorization Type [employer]    PT Start Time 1230    PT Stop Time 1315    PT Time Calculation (min) 45 min    Activity Tolerance Patient tolerated treatment well    Behavior During Therapy WFL for tasks assessed/performed               Past Medical History:  Diagnosis Date   Arthritis    Back pain of lumbar region with sciatica    Cancer (HCC)    breast   Depression    Diabetes mellitus, type II (HCC)    Family history of anesthesia complication    grandfather died under anesthesia 70's- had heart issues   H/O carpal tunnel repair 2014   Headache(784.0)    Hyperlipidemia    Hypertension    Hypothyroidism    Medial meniscus tear    PONV (postoperative nausea and vomiting)    Sleep apnea    cpap since 10   Past Surgical History:  Procedure Laterality Date   BILATERAL TOTAL MASTECTOMY WITH AXILLARY LYMPH NODE DISSECTION     BREAST RECONSTRUCTION     CERVICAL DISC ARTHROPLASTY N/A 03/19/2013   Procedure: CERVICAL FIVE TO SIX, CERVICAL SIX TO SEVEN CERVICAL ANTERIOR DISC ARTHROPLASTY;  Surgeon: Elna Haggis, MD;  Location: [facility];  Service: Neurosurgery;  Laterality: N/A;  C56 C67 artificial disc replacement   CESAREAN SECTION     COLONOSCOPY     DILATATION & CURETTAGE/HYSTEROSCOPY WITH TRUECLEAR N/A 11/25/2013   Procedure: DILATATION & CURETTAGE/HYSTEROSCOPY WITH TRUCLEAR;  Surgeon: Piedad Brewer, MD;  Location: [facility];  Service: Gynecology;  Laterality: N/A;   ECTOPIC PREGNANCY SURGERY     GALLBLADDER SURGERY     MOUTH SURGERY     child- dog bite   SPINAL  FUSION     2023   STERIOD INJECTION Left 09/09/2022   Procedure: LEFT KNEE STEROID INJECTION;  Surgeon: Liliane Rei, MD;  Location: [facility];  Service: Orthopedics;  Laterality: Left;  left knee injection 0928   TONSILLECTOMY     TOTAL KNEE ARTHROPLASTY Right 09/09/2022   Procedure: TOTAL KNEE ARTHROPLASTY;  Surgeon: Liliane Rei, MD;  Location: [facility];  Service: Orthopedics;  Laterality: Right;   TUBAL LIGATION     Patient Active Problem List   Diagnosis Date Noted   OA (osteoarthritis) of knee 09/09/2022   Exertional dyspnea 04/17/2022   Elevated coronary artery calcium  score 04/17/2022   Spondylolisthesis at L4-L5 level 12/27/2021   Ductal carcinoma in situ (DCIS) of left breast 01/19/2018   Generalized anxiety disorder 11/07/2017   Depression 11/07/2017   Insomnia 11/07/2017   Cervical spondylosis 03/19/2013    PCP: Margarete Sharps  REFERRING PROVIDER: Athena Lazier  REFERRING DIAG: R TKA 09/09/22  THERAPY DIAG:  Chronic bilateral back pain, unspecified back location  Muscle weakness (generalized)  Rationale for Evaluation and Treatment: Rehabilitation  ONSET DATE: 09/09/22  SUBJECTIVE:   SUBJECTIVE STATEMENT :  Back is still hurting, MRI is on Thursday.   R TKA 8/19 L knee steroid injection 09/09/22  PAIN:  Are you having pain? Yes: NPRS scale: 6/10 Pain location: right low back, buttock and reports around to the front of the thigh Pain description: sharp really hurts Aggravating factors: difficulty sleeping, difficulty walking and standing and sitting Relieving factors: heat, Aleve , tramadol  and methacarbomol  PRECAUTIONS: None  RED FLAGS: None   WEIGHT BEARING RESTRICTIONS: No  FALLS:  Has patient fallen in last 6 months? No  LIVING ENVIRONMENT: Lives with: lives with their family and lives with their spouse Lives in: House/apartment Stairs: Yes: External: 3 steps; none Has following equipment at home: Single point cane and Walker - 2  wheeled  OCCUPATION: Not working  PLOF: Independent  PATIENT GOALS: less back pain  NEXT MD VISIT: 09/24/22  OBJECTIVE:   PATIENT SURVEYS:  FOTO 4  COGNITION: Overall cognitive status: Within functional limits for tasks assessed      EDEMA:  Increased swelling surrounding R knee  POSTURE: rounded shoulders and flexed trunk   PALPATION: Warm and TTP  LOWER EXTREMITY ROM:  Active ROM Right eval Left eval Right 09/24/22 Right 09/26/22 Right 10/02/22  11/01/22 12/09/22  Hip flexion          Hip extension          Hip abduction          Hip adduction          Hip internal rotation          Hip external rotation          Knee flexion  60  75* AROM, 86 PROM 82* AROM seated per pt request  78* self AAROM, 76* AROM  PROM 87 AROM 81 PROM 90 AROM 83 AROM 101 PROM 105  Knee extension -30  -15* AROM seated, -15 PROM supine   -16* supine heel prop with quad set  AROM 25 PROM 16 AROM 14 AROM 5 PROM 0  Ankle dorsiflexion          Ankle plantarflexion          Ankle inversion          Ankle eversion           (Blank rows = not tested)  LOWER EXTREMITY MMT:  MMT Right eval Left eval  Hip flexion 2   Hip extension    Hip abduction    Hip adduction    Hip internal rotation    Hip external rotation    Knee flexion 2   Knee extension 2   Ankle dorsiflexion    Ankle plantarflexion    Ankle inversion    Ankle eversion     (Blank rows = not tested)  LUMBAR ROM:  decreased 50% flexion, decreased 75% for extension and side bending, pain was worse with extension and right side bending. Hooklying is less painful that lying flat on back Mild tightness with pain during HS stretch, tight and painful piriformis bilaterally worse on the right , very weak core  FUNCTIONAL TESTS:  5 times sit to stand: 30 seconds 03/20/23 Timed up and go (TUG): 1 min 28s  GAIT: Is no without assistive device, mild antalgic on the right    TODAY'S TREATMENT:  DATE:  05/13/23 Recheck goals for progress note  Gait around parking lot  NuStep L5x40mins  5# straight arm pulls 2x10 AR press 10# 2x10 Calf stretch 30s  Supine LE stretches- LTR, HS, SKTC, figure 4    05/07/23 Bike level 5 x 6 minutes 5# straight arm pulls 5 # hip extension some pain Feet on ball K2C, rotation, small bridge, isometric abs Manual sheet traction STM with the Tgun in the right low back and buttock  05/05/23 Bike level 5 x 6 minutes Gait outside around the parking [place] 5# straight arm pulls 15# lats Feet on ball K2C, rotation, bridges, isometric abs Manual sheet traction STM to the right low back and buttock  05/01/23 Bike level 4 x  minute 5# straight arm pulls 5# AR press Hip extension no weight Hip abduction no weight Feet on ball K2C, trunk rotation, posterior isometrics and isometric abs STM to back and hips Manual sheet traction gently  04/28/23 Bike level 4 x 5 minutes with towel roll behind back 5# straight arm pulls cues for posture and core activation Feet on ball K2C, rotation, bridge, isometric abs Passive LE stretches STM to the right low back and buttock Tgun to above Manual sheet traction  04/24/23 Gait walking around the back building, she reported no increase of LBP but was already pain at a 7/10, she reports that she did not have to push on her back with her hands Feet on ball K2C, rotation, small bridge, isometric abs Passive LE stretches Passive gentle sheet lumbar traction STM with the T-gun to the lumbar area and the glutes  04/21/23 Gait outside around the parking [employer] level 4 x 5 minutes 5# straight arm pulls mild increase of LBP Feet on ball K2C, rotation, smaal posterior activation , isometric abs Passive LE stretches STM with the T-gun to the right low back and buttock on the right Manual pelvic traction  04/17/23 Gait outside  descent pace, this does increase pain Bike level 3 x 5 minutes PROM LE's STM with the T-gun to the low back and buttock Gentle manual sheet traction  04/15/23 Nustep level 5 x 6 minutes Bike level 3 x 5 minutes Leg press 20# and no weight  5# straight arm pulls STM to the right low back and buttock Gentle manual pelvic traction with sheet  04/10/23 Sit ft pelvic ROM 4 way s 15 x each Nustep L 5 Red tband seated trunk ext 2 sets 10,10 x each trunk rotation Iso abs with ball 10 x hold 3 sec Feet on ball bridge, KTC and obl ADD ball squeeze Clams and hip flex red tband 2 sets 10 Passive stretch to the LE's Very gentle manual sheet traction lumbar   04/08/23 STM with the Tgun to the right low back and right buttock Feet on ball K2C, small rotation, small bridge and isometric abs Passive stretch to the LE's Very gentle manual sheet traction lumbar  MHP/IFC to the right low back in supine feet elevated  04/03/23 Nustep L 5 6 min Sitting on dyna disc pelvic ROM 10x for pelvic ROM and stab Sitting on dyna disc for stab LAQ,hip flex,hip abd 10 x each Sitting on dyna disc shld row and ext red tband 2 sets 10 Supine LE stretches- HS, LTR, SKTC, piriformis, glutes RT LE NT stretching-positive MH/IFC RT LB supine   04/01/23 Walk outdoors  NuStep L5x54mins  Seated row 20# 2x10 Lat pull down 20# 2x10 BlackTB ext 2x10 Supine LE stretches- HS,  LTR, SKTC, piriformis, glutes    03/26/23 NuStep L5 x 6 min Bridges LE on pball bridges x5, K2C x10, Oblq   Passive stretching to HS, piriformis, ITB, Glute Shoulder Ext 5lb x10 Standing rows 5lb x12  03/20/23 Evaluation of the lumbar spine order from Dr. Ellery Guthrie  PATIENT EDUCATION:  Education details: POC and HEP Person educated: Patient Education method: Explanation Education comprehension: verbalized understanding  HOME EXERCISE PROGRAM: Access Code: [facility] Access Code: [email or web address] URL:  [email or web address] Date: 03/20/2023 Prepared by: Cherylene Corrente  Exercises - Hooklying Single Knee to Chest Stretch  - 2 x daily - 7 x weekly - 1 sets - 10 reps - 5 hold - Supine Double Knee to Chest  - 2 x daily - 7 x weekly - 1 sets - 10 reps - 5 hold - Supine Lower Trunk Rotation with PLB  - 2 x daily - 7 x weekly - 1 sets - 10 reps - 5 hold - Supine Posterior Pelvic Tilt  - 2 x daily - 7 x weekly - 1 sets - 10 reps - 3 hold  ASSESSMENT:  CLINICAL IMPRESSION:   Pt reports ongoing pain in her back. Reports some shooting pain in RLE with AR press. She also has some pain with the stretches. Her pain is always on the L side, but does not go below the knee. With goal recheck, her back range of motion is still very limited and painful. Core is still very weak. She will undergo an MRI on Thursday.   OBJECTIVE IMPAIRMENTS: Abnormal gait, cardiopulmonary status limiting activity, decreased activity tolerance, decreased endurance, decreased mobility, difficulty walking, decreased ROM, decreased strength, increased muscle spasms, impaired flexibility, improper body mechanics, postural dysfunction, and pain.   REHAB POTENTIAL: Good  CLINICAL DECISION MAKING: Stable/uncomplicated  EVALUATION COMPLEXITY: Low   GOALS: Goals reviewed with patient? Yes  SHORT TERM GOALS: Target date: 10/24/22  Patient will be independent with initial HEP. Goal status: 09/20/22 met  2.  Patient will be do TUG < 30s Baseline: 1 min 28s  Goal status: ongoing 10/07/22, 18.57s MET 10/09/22  3.  Patient will be able to do 5 sit to stands  Baseline: unable to do  Goal status: progressing 10/04/22, MET 10/09/22   LONG TERM GOALS: Target date: 05/18/23  Patient will be independent with advanced/ongoing HEP to improve outcomes and carryover.  Goal status: ongoing 11/06/22  2.  Patient will report at least 75% improvement in R knee pain to improve QOL. Baseline: 10/10 Goal status: met 02/03/23 60%  better 11/20/22, 65% 12/06/22, 70% 12/17/22  3.  Patient will demonstrate improved R knee AROM to >/= 0-120 deg to allow for normal gait and stair mechanics. Baseline: 30-0-60 10/09/22: 16-0-78  11/01/22: 14-0-83 11/20/22: 10-97 12/06/22: 10-0-100 12/17/22: 8-0-104 05/13/23: 5-0-115 Goal status: continue to progress and getting closer 0-112  03/13/23. Ongoing 05/13/23  4.  Patient will demonstrate improved functional LE strength as demonstrated by 5/5. Baseline: 2/5 Goal status: MET 3+/5 10/09/22, 5/5 11/01/22   5.  Patient will be able to ambulate 500' with LRAD and normal gait pattern without increased pain to access community.  Baseline: using RW small in home ambulation distances Goal status: progressing w/SPC 12/02/22, doing some walking without AD, MET 05/13/23  6.  Patient will report 53 on FOTO (patient reported outcome measure) to demonstrate improved functional ability. Baseline: 4, 40-11/11/24 Goal status: MET 53 12/17/22  7.  Decrease lumbar pain 50%  Goal status: ongoing 05/01/23, 45% 05/13/23  8.  Increase  lumbar ROM 25%  Goal status: ongoing 04/24/23, ongoing 05/13/23-- extension, L side bending, and R rotation is very limited and painful   9.  Report able to put shoes and socks on without pain > 4/10  Goal status: ongoing still pain 7/10 05/01/23, MET 05/13/23  PLAN:  PT FREQUENCY: 2x/week  PT DURATION: 12 weeks  PLANNED INTERVENTIONS: Therapeutic exercises, Therapeutic activity, Neuromuscular re-education, Balance training, Gait training, Patient/Family education, Self Care, Joint mobilization, Stair training, Electrical stimulation, Cryotherapy, Moist heat, scar mobilization, Vasopneumatic device, and Manual therapy  PLAN FOR NEXT SESSION: continue conservative until MRI   Patient Details  Name: CALLAN YONTZ MRN: [id number] Date of Birth: 06/15/1960 Referring Provider:  Elna Haggis, MD  Encounter Date: 05/13/2023   Donavon Fudge, PT 05/13/2023, 1:16  PM

## 2023-05-13 NOTE — Progress Notes (Unsigned)
      [facility] Counselor/Therapist Progress Note  Patient ID: [name], MRN: [id number],    Date: 05/13/2023  Time Spent: ***   Treatment Type: Family with patient  Patient present with spouse for session.  Reported Symptoms: ***  Mental Status Exam:  Appearance:   Neat     Behavior:  Appropriate, Sharing, and Motivated  Motor:  Normal  Speech/Language:   Clear and Coherent and Normal Rate  Affect:  Appropriate and Congruent  Mood:  irritable  Thought process:  normal  Thought content:    WNL  Sensory/Perceptual disturbances:    WNL  Orientation:  oriented to person, place, time/date, and situation  Attention:  Good  Concentration:  Good  Memory:  WNL  Fund of knowledge:   Good  Insight:    Good  Judgment:   Good  Impulse Control:  Good   Risk Assessment: Danger to Self:  No Self-injurious Behavior: No Danger to Others: No Duty to Warn:no Physical Aggression / Violence:No  Access to Firearms a concern: No  Gang Involvement:No   Subjective: ***   Interventions: Motivational Interviewing, Solution-Oriented/Positive Psychology, Humanistic/Existential, Insight-Oriented, and Family Systems  Diagnosis:   ICD-10-CM   1. Generalized anxiety disorder  F41.1     2. Major depressive disorder, recurrent episode, moderate (HCC)  F33.1       Plan: ***  Anthon Kins, [facility]

## 2023-05-15 ENCOUNTER — Encounter: Payer: Self-pay | Admitting: Physical Therapy

## 2023-05-15 ENCOUNTER — Ambulatory Visit: Admitting: Physical Therapy

## 2023-05-15 DIAGNOSIS — R600 Localized edema: Secondary | ICD-10-CM

## 2023-05-15 DIAGNOSIS — M549 Dorsalgia, unspecified: Secondary | ICD-10-CM | POA: Diagnosis not present

## 2023-05-15 DIAGNOSIS — M25661 Stiffness of right knee, not elsewhere classified: Secondary | ICD-10-CM

## 2023-05-15 DIAGNOSIS — M6281 Muscle weakness (generalized): Secondary | ICD-10-CM

## 2023-05-15 DIAGNOSIS — M25561 Pain in right knee: Secondary | ICD-10-CM

## 2023-05-15 DIAGNOSIS — Z96651 Presence of right artificial knee joint: Secondary | ICD-10-CM

## 2023-05-15 DIAGNOSIS — G8929 Other chronic pain: Secondary | ICD-10-CM

## 2023-05-15 NOTE — Therapy (Signed)
 OUTPATIENT PHYSICAL THERAPY LOWER EXTREMITY TREATMENT    Patient Name: [name] MRN: [id number] DOB:1960-02-12, 63 y.o., female Today's Date: 05/15/2023  END OF SESSION:  PT End of Session - 05/15/23 1532     Visit Number 81    Date for PT Re-Evaluation 05/18/23    Authorization Type [employer]    PT Start Time 1530    PT Stop Time 1615    PT Time Calculation (min) 45 min    Activity Tolerance Patient tolerated treatment well    Behavior During Therapy WFL for tasks assessed/performed               Past Medical History:  Diagnosis Date   Arthritis    Back pain of lumbar region with sciatica    Cancer (HCC)    breast   Depression    Diabetes mellitus, type II (HCC)    Family history of anesthesia complication    grandfather died under anesthesia 70's- had heart issues   H/O carpal tunnel repair 2014   Headache(784.0)    Hyperlipidemia    Hypertension    Hypothyroidism    Medial meniscus tear    PONV (postoperative nausea and vomiting)    Sleep apnea    cpap since 10   Past Surgical History:  Procedure Laterality Date   BILATERAL TOTAL MASTECTOMY WITH AXILLARY LYMPH NODE DISSECTION     BREAST RECONSTRUCTION     CERVICAL DISC ARTHROPLASTY N/A 03/19/2013   Procedure: CERVICAL FIVE TO SIX, CERVICAL SIX TO SEVEN CERVICAL ANTERIOR DISC ARTHROPLASTY;  Surgeon: Elna Haggis, MD;  Location: [facility];  Service: Neurosurgery;  Laterality: N/A;  C56 C67 artificial disc replacement   CESAREAN SECTION     COLONOSCOPY     DILATATION & CURETTAGE/HYSTEROSCOPY WITH TRUECLEAR N/A 11/25/2013   Procedure: DILATATION & CURETTAGE/HYSTEROSCOPY WITH TRUCLEAR;  Surgeon: Piedad Brewer, MD;  Location: [facility];  Service: Gynecology;  Laterality: N/A;   ECTOPIC PREGNANCY SURGERY     GALLBLADDER SURGERY     MOUTH SURGERY     child- dog bite   SPINAL FUSION     2023   STERIOD INJECTION Left 09/09/2022   Procedure: LEFT KNEE STEROID INJECTION;  Surgeon: Liliane Rei, MD;   Location: [facility];  Service: Orthopedics;  Laterality: Left;  left knee injection 0928   TONSILLECTOMY     TOTAL KNEE ARTHROPLASTY Right 09/09/2022   Procedure: TOTAL KNEE ARTHROPLASTY;  Surgeon: Liliane Rei, MD;  Location: [facility];  Service: Orthopedics;  Laterality: Right;   TUBAL LIGATION     Patient Active Problem List   Diagnosis Date Noted   OA (osteoarthritis) of knee 09/09/2022   Exertional dyspnea 04/17/2022   Elevated coronary artery calcium  score 04/17/2022   Spondylolisthesis at L4-L5 level 12/27/2021   Ductal carcinoma in situ (DCIS) of left breast 01/19/2018   Generalized anxiety disorder 11/07/2017   Depression 11/07/2017   Insomnia 11/07/2017   Cervical spondylosis 03/19/2013    PCP: Margarete Sharps  REFERRING PROVIDER: Athena Lazier  REFERRING DIAG: R TKA 09/09/22  THERAPY DIAG:  Chronic bilateral back pain, unspecified back location  Muscle weakness (generalized)  S/P total knee arthroplasty, right  Acute pain of right knee  Stiffness of right knee, not elsewhere classified  Localized edema  Rationale for Evaluation and Treatment: Rehabilitation  ONSET DATE: 09/09/22  SUBJECTIVE:   SUBJECTIVE STATEMENT :  Still hurting had MRI for breasts on Monday and then had back MRI today.  Has appointment with the back  MD on May 9th   R TKA 8/19 L knee steroid injection 09/09/22  PAIN:  Are you having pain? Yes: NPRS scale: 6/10 Pain location: right low back, buttock and reports around to the front of the thigh Pain description: sharp really hurts Aggravating factors: difficulty sleeping, difficulty walking and standing and sitting Relieving factors: heat, Aleve , tramadol  and methacarbomol  PRECAUTIONS: None  RED FLAGS: None   WEIGHT BEARING RESTRICTIONS: No  FALLS:  Has patient fallen in last 6 months? No  LIVING ENVIRONMENT: Lives with: lives with their family and lives with their spouse Lives in: House/apartment Stairs: Yes: External: 3 steps;  none Has following equipment at home: Single point cane and Walker - 2 wheeled  OCCUPATION: Not working  PLOF: Independent  PATIENT GOALS: less back pain  NEXT MD VISIT: 09/24/22  OBJECTIVE:   PATIENT SURVEYS:  FOTO 4  COGNITION: Overall cognitive status: Within functional limits for tasks assessed      EDEMA:  Increased swelling surrounding R knee  POSTURE: rounded shoulders and flexed trunk   PALPATION: Warm and TTP  LOWER EXTREMITY ROM:  Active ROM Right eval Left eval Right 09/24/22 Right 09/26/22 Right 10/02/22  11/01/22 12/09/22  Hip flexion          Hip extension          Hip abduction          Hip adduction          Hip internal rotation          Hip external rotation          Knee flexion  60  75* AROM, 86 PROM 82* AROM seated per pt request  78* self AAROM, 76* AROM  PROM 87 AROM 81 PROM 90 AROM 83 AROM 101 PROM 105  Knee extension -30  -15* AROM seated, -15 PROM supine   -16* supine heel prop with quad set  AROM 25 PROM 16 AROM 14 AROM 5 PROM 0  Ankle dorsiflexion          Ankle plantarflexion          Ankle inversion          Ankle eversion           (Blank rows = not tested)  LOWER EXTREMITY MMT:  MMT Right eval Left eval  Hip flexion 2   Hip extension    Hip abduction    Hip adduction    Hip internal rotation    Hip external rotation    Knee flexion 2   Knee extension 2   Ankle dorsiflexion    Ankle plantarflexion    Ankle inversion    Ankle eversion     (Blank rows = not tested)  LUMBAR ROM:  decreased 50% flexion, decreased 75% for extension and side bending, pain was worse with extension and right side bending. Hooklying is less painful that lying flat on back Mild tightness with pain during HS stretch, tight and painful piriformis bilaterally worse on the right , very weak core  FUNCTIONAL TESTS:  5 times sit to stand: 30 seconds 03/20/23 Timed up and go (TUG): 1 min 28s  GAIT: Is no without assistive device, mild antalgic on  the right    TODAY'S TREATMENT:  DATE:  05/15/23 Bike level 5 x 5 minutes 5# straight arm pulls cues for core activation Feet on ball K2C, rotation, small bridge, isometric abs Passive LE stretches STM to the right buttock and the bilateral lumbar paraspinals Manual sheet tractoin  05/13/23 Recheck goals for progress note  Gait around parking lot  NuStep L5x29mins  5# straight arm pulls 2x10 AR press 10# 2x10 Calf stretch 30s  Supine LE stretches- LTR, HS, SKTC, figure 4    05/07/23 Bike level 5 x 6 minutes 5# straight arm pulls 5 # hip extension some pain Feet on ball K2C, rotation, small bridge, isometric abs Manual sheet traction STM with the Tgun in the right low back and buttock  05/05/23 Bike level 5 x 6 minutes Gait outside around the parking [place] 5# straight arm pulls 15# lats Feet on ball K2C, rotation, bridges, isometric abs Manual sheet traction STM to the right low back and buttock  05/01/23 Bike level 4 x  minute 5# straight arm pulls 5# AR press Hip extension no weight Hip abduction no weight Feet on ball K2C, trunk rotation, posterior isometrics and isometric abs STM to back and hips Manual sheet traction gently  04/28/23 Bike level 4 x 5 minutes with towel roll behind back 5# straight arm pulls cues for posture and core activation Feet on ball K2C, rotation, bridge, isometric abs Passive LE stretches STM to the right low back and buttock Tgun to above Manual sheet traction  04/24/23 Gait walking around the back building, she reported no increase of LBP but was already pain at a 7/10, she reports that she did not have to push on her back with her hands Feet on ball K2C, rotation, small bridge, isometric abs Passive LE stretches Passive gentle sheet lumbar traction STM with the T-gun to the lumbar area and the  glutes  04/21/23 Gait outside around the parking [employer] level 4 x 5 minutes 5# straight arm pulls mild increase of LBP Feet on ball K2C, rotation, smaal posterior activation , isometric abs Passive LE stretches STM with the T-gun to the right low back and buttock on the right Manual pelvic traction  04/17/23 Gait outside descent pace, this does increase pain Bike level 3 x 5 minutes PROM LE's STM with the T-gun to the low back and buttock Gentle manual sheet traction  04/15/23 Nustep level 5 x 6 minutes Bike level 3 x 5 minutes Leg press 20# and no weight  5# straight arm pulls STM to the right low back and buttock Gentle manual pelvic traction with sheet  04/10/23 Sit ft pelvic ROM 4 way s 15 x each Nustep L 5 Red tband seated trunk ext 2 sets 10,10 x each trunk rotation Iso abs with ball 10 x hold 3 sec Feet on ball bridge, KTC and obl ADD ball squeeze Clams and hip flex red tband 2 sets 10 Passive stretch to the LE's Very gentle manual sheet traction lumbar   04/08/23 STM with the Tgun to the right low back and right buttock Feet on ball K2C, small rotation, small bridge and isometric abs Passive stretch to the LE's Very gentle manual sheet traction lumbar  MHP/IFC to the right low back in supine feet elevated  04/03/23 Nustep L 5 6 min Sitting on dyna disc pelvic ROM 10x for pelvic ROM and stab Sitting on dyna disc for stab LAQ,hip flex,hip abd 10 x each Sitting on dyna disc shld row and ext red tband 2 sets 10  Supine LE stretches- HS, LTR, SKTC, piriformis, glutes RT LE NT stretching-positive MH/IFC RT LB supine   04/01/23 Walk outdoors  NuStep L5x37mins  Seated row 20# 2x10 Lat pull down 20# 2x10 BlackTB ext 2x10 Supine LE stretches- HS, LTR, SKTC, piriformis, glutes    03/26/23 NuStep L5 x 6 min Bridges LE on pball bridges x5, K2C x10, Oblq   Passive stretching to HS, piriformis, ITB, Glute Shoulder Ext 5lb x10 Standing rows 5lb  x12  03/20/23 Evaluation of the lumbar spine order from Dr. Ellery Guthrie  PATIENT EDUCATION:  Education details: POC and HEP Person educated: Patient Education method: Explanation Education comprehension: verbalized understanding  HOME EXERCISE PROGRAM: Access Code: [facility] Access Code: [email or web address] URL: [email or web address] Date: 03/20/2023 Prepared by: Cherylene Corrente  Exercises - Hooklying Single Knee to Chest Stretch  - 2 x daily - 7 x weekly - 1 sets - 10 reps - 5 hold - Supine Double Knee to Chest  - 2 x daily - 7 x weekly - 1 sets - 10 reps - 5 hold - Supine Lower Trunk Rotation with PLB  - 2 x daily - 7 x weekly - 1 sets - 10 reps - 5 hold - Supine Posterior Pelvic Tilt  - 2 x daily - 7 x weekly - 1 sets - 10 reps - 3 hold  ASSESSMENT:  CLINICAL IMPRESSION:   Pt reports ongoing pain in her back.she had an MRI today and will see the MD on May 9th for the results.  She continues to report good relief with the manual traction.  OBJECTIVE IMPAIRMENTS: Abnormal gait, cardiopulmonary status limiting activity, decreased activity tolerance, decreased endurance, decreased mobility, difficulty walking, decreased ROM, decreased strength, increased muscle spasms, impaired flexibility, improper body mechanics, postural dysfunction, and pain.   REHAB POTENTIAL: Good  CLINICAL DECISION MAKING: Stable/uncomplicated  EVALUATION COMPLEXITY: Low   GOALS: Goals reviewed with patient? Yes  SHORT TERM GOALS: Target date: 10/24/22  Patient will be independent with initial HEP. Goal status: 09/20/22 met  2.  Patient will be do TUG < 30s Baseline: 1 min 28s  Goal status: ongoing 10/07/22, 18.57s MET 10/09/22  3.  Patient will be able to do 5 sit to stands  Baseline: unable to do  Goal status: progressing 10/04/22, MET 10/09/22   LONG TERM GOALS: Target date: 05/18/23  Patient will be independent with advanced/ongoing HEP to improve outcomes and carryover.  Goal status: ongoing  11/06/22  2.  Patient will report at least 75% improvement in R knee pain to improve QOL. Baseline: 10/10 Goal status: met 02/03/23 60% better 11/20/22, 65% 12/06/22, 70% 12/17/22  3.  Patient will demonstrate improved R knee AROM to >/= 0-120 deg to allow for normal gait and stair mechanics. Baseline: 30-0-60 10/09/22: 16-0-78  11/01/22: 14-0-83 11/20/22: 10-97 12/06/22: 10-0-100 12/17/22: 8-0-104 05/13/23: 5-0-115 Goal status: continue to progress and getting closer 0-112  03/13/23. Ongoing 05/13/23  4.  Patient will demonstrate improved functional LE strength as demonstrated by 5/5. Baseline: 2/5 Goal status: MET 3+/5 10/09/22, 5/5 11/01/22   5.  Patient will be able to ambulate 500' with LRAD and normal gait pattern without increased pain to access community.  Baseline: using RW small in home ambulation distances Goal status: progressing w/SPC 12/02/22, doing some walking without AD, MET 05/13/23  6.  Patient will report 66 on FOTO (patient reported outcome measure) to demonstrate improved functional ability. Baseline: 4, 40-11/11/24 Goal status: MET 53 12/17/22  7.  Decrease lumbar pain 50%  Goal status:  ongoing 05/01/23, 45% 05/13/23  8.  Increase lumbar ROM 25%  Goal status: ongoing 04/24/23, ongoing 05/13/23-- extension, L side bending, and R rotation is very limited and painful   9.  Report able to put shoes and socks on without pain > 4/10  Goal status: ongoing still pain 7/10 05/01/23, MET 05/13/23  PLAN:  PT FREQUENCY: 2x/week  PT DURATION: 12 weeks  PLANNED INTERVENTIONS: Therapeutic exercises, Therapeutic activity, Neuromuscular re-education, Balance training, Gait training, Patient/Family education, Self Care, Joint mobilization, Stair training, Electrical stimulation, Cryotherapy, Moist heat, scar mobilization, Vasopneumatic device, and Manual therapy  PLAN FOR NEXT SESSION: continue conservative until MRI   Patient Details  Name: ALACIA REHMANN MRN:  [id number] Date of Birth: [birth year] Referring Provider:  Elna Haggis, MD  Encounter Date: 05/15/2023   Hollis Lurie, PT 05/15/2023, 3:34 PM

## 2023-05-19 ENCOUNTER — Ambulatory Visit: Admitting: Physical Therapy

## 2023-05-19 ENCOUNTER — Encounter: Payer: Self-pay | Admitting: Physical Therapy

## 2023-05-19 ENCOUNTER — Other Ambulatory Visit: Payer: Self-pay | Admitting: Internal Medicine

## 2023-05-19 DIAGNOSIS — M549 Dorsalgia, unspecified: Secondary | ICD-10-CM | POA: Diagnosis not present

## 2023-05-19 DIAGNOSIS — M25561 Pain in right knee: Secondary | ICD-10-CM

## 2023-05-19 DIAGNOSIS — M25661 Stiffness of right knee, not elsewhere classified: Secondary | ICD-10-CM

## 2023-05-19 DIAGNOSIS — M6281 Muscle weakness (generalized): Secondary | ICD-10-CM

## 2023-05-19 DIAGNOSIS — E042 Nontoxic multinodular goiter: Secondary | ICD-10-CM

## 2023-05-19 DIAGNOSIS — Z96651 Presence of right artificial knee joint: Secondary | ICD-10-CM

## 2023-05-19 DIAGNOSIS — G8929 Other chronic pain: Secondary | ICD-10-CM

## 2023-05-19 NOTE — Therapy (Signed)
 OUTPATIENT PHYSICAL THERAPY LOWER EXTREMITY TREATMENT    Patient Name: [name] MRN: [id number] DOB:[birth year], 63 y.o., female Today's Date: 05/19/2023  END OF SESSION:  PT End of Session - 05/19/23 1530     Visit Number 82    Date for PT Re-Evaluation 07/19/23    Authorization Type [employer]    PT Start Time 1530    PT Stop Time 1615    PT Time Calculation (min) 45 min    Activity Tolerance Patient tolerated treatment well    Behavior During Therapy WFL for tasks assessed/performed               Past Medical History:  Diagnosis Date   Arthritis    Back pain of lumbar region with sciatica    Cancer (HCC)    breast   Depression    Diabetes mellitus, type II (HCC)    Family history of anesthesia complication    grandfather died under anesthesia 70's- had heart issues   H/O carpal tunnel repair 2014   Headache(784.0)    Hyperlipidemia    Hypertension    Hypothyroidism    Medial meniscus tear    PONV (postoperative nausea and vomiting)    Sleep apnea    cpap since 10   Past Surgical History:  Procedure Laterality Date   BILATERAL TOTAL MASTECTOMY WITH AXILLARY LYMPH NODE DISSECTION     BREAST RECONSTRUCTION     CERVICAL DISC ARTHROPLASTY N/A 03/19/2013   Procedure: CERVICAL FIVE TO SIX, CERVICAL SIX TO SEVEN CERVICAL ANTERIOR DISC ARTHROPLASTY;  Surgeon: Elna Haggis, MD;  Location: [facility];  Service: Neurosurgery;  Laterality: N/A;  C56 C67 artificial disc replacement   CESAREAN SECTION     COLONOSCOPY     DILATATION & CURETTAGE/HYSTEROSCOPY WITH TRUECLEAR N/A 11/25/2013   Procedure: DILATATION & CURETTAGE/HYSTEROSCOPY WITH TRUCLEAR;  Surgeon: Piedad Brewer, MD;  Location: [facility];  Service: Gynecology;  Laterality: N/A;   ECTOPIC PREGNANCY SURGERY     GALLBLADDER SURGERY     MOUTH SURGERY     child- dog bite   SPINAL FUSION     2023   STERIOD INJECTION Left 09/09/2022   Procedure: LEFT KNEE STEROID INJECTION;  Surgeon: Liliane Rei, MD;   Location: [facility];  Service: Orthopedics;  Laterality: Left;  left knee injection 0928   TONSILLECTOMY     TOTAL KNEE ARTHROPLASTY Right 09/09/2022   Procedure: TOTAL KNEE ARTHROPLASTY;  Surgeon: Liliane Rei, MD;  Location: [facility];  Service: Orthopedics;  Laterality: Right;   TUBAL LIGATION     Patient Active Problem List   Diagnosis Date Noted   OA (osteoarthritis) of knee 09/09/2022   Exertional dyspnea 04/17/2022   Elevated coronary artery calcium  score 04/17/2022   Spondylolisthesis at L4-L5 level 12/27/2021   Ductal carcinoma in situ (DCIS) of left breast 01/19/2018   Generalized anxiety disorder 11/07/2017   Depression 11/07/2017   Insomnia 11/07/2017   Cervical spondylosis 03/19/2013    PCP: Margarete Sharps  REFERRING PROVIDER: Athena Lazier  REFERRING DIAG: R TKA 09/09/22  THERAPY DIAG:  Chronic bilateral back pain, unspecified back location  Muscle weakness (generalized)  S/P total knee arthroplasty, right  Acute pain of right knee  Stiffness of right knee, not elsewhere classified  Rationale for Evaluation and Treatment: Rehabilitation  ONSET DATE: 09/09/22  SUBJECTIVE:   SUBJECTIVE STATEMENT :  No results from the MRI on the back, Pain in back is a 6/10 just sore int eh knees, is starting to get  Gel injections   R TKA 8/19 L knee steroid injection 09/09/22  PAIN:  Are you having pain? Yes: NPRS scale: 6/10 Pain location: right low back, buttock and reports around to the front of the thigh Pain description: sharp really hurts Aggravating factors: difficulty sleeping, difficulty walking and standing and sitting Relieving factors: heat, Aleve , tramadol  and methacarbomol  PRECAUTIONS: None  RED FLAGS: None   WEIGHT BEARING RESTRICTIONS: No  FALLS:  Has patient fallen in last 6 months? No  LIVING ENVIRONMENT: Lives with: lives with their family and lives with their spouse Lives in: House/apartment Stairs: Yes: External: 3 steps; none Has following  equipment at home: Single point cane and Walker - 2 wheeled  OCCUPATION: Not working  PLOF: Independent  PATIENT GOALS: less back pain  NEXT MD VISIT: 09/24/22  OBJECTIVE:   PATIENT SURVEYS:  FOTO 4  COGNITION: Overall cognitive status: Within functional limits for tasks assessed      EDEMA:  Increased swelling surrounding R knee  POSTURE: rounded shoulders and flexed trunk   PALPATION: Warm and TTP  LOWER EXTREMITY ROM:  Active ROM Right eval Left eval Right 09/24/22 Right 09/26/22 Right 10/02/22  11/01/22 12/09/22  Hip flexion          Hip extension          Hip abduction          Hip adduction          Hip internal rotation          Hip external rotation          Knee flexion  60  75* AROM, 86 PROM 82* AROM seated per pt request  78* self AAROM, 76* AROM  PROM 87 AROM 81 PROM 90 AROM 83 AROM 101 PROM 105  Knee extension -30  -15* AROM seated, -15 PROM supine   -16* supine heel prop with quad set  AROM 25 PROM 16 AROM 14 AROM 5 PROM 0  Ankle dorsiflexion          Ankle plantarflexion          Ankle inversion          Ankle eversion           (Blank rows = not tested)  LOWER EXTREMITY MMT:  MMT Right eval Left eval  Hip flexion 2   Hip extension    Hip abduction    Hip adduction    Hip internal rotation    Hip external rotation    Knee flexion 2   Knee extension 2   Ankle dorsiflexion    Ankle plantarflexion    Ankle inversion    Ankle eversion     (Blank rows = not tested)  LUMBAR ROM:  decreased 50% flexion, decreased 75% for extension and side bending, pain was worse with extension and right side bending. Hooklying is less painful that lying flat on back Mild tightness with pain during HS stretch, tight and painful piriformis bilaterally worse on the right , very weak core  FUNCTIONAL TESTS:  5 times sit to stand: 30 seconds 03/20/23 Timed up and go (TUG): 1 min 28s  GAIT: Is no without assistive device, mild antalgic on the right     TODAY'S TREATMENT:  DATE:  05/19/23 Bike level 4 x 6 minutes 5# straight arm pulls Seated rows 20# Lats 20# 10# AR press some pain in leg so we stopped Feet on ball K2C, rotation, bridges, isometric abs Passive stretch, STM to the lumbar area left, sheet traction   05/15/23 Bike level 5 x 5 minutes 5# straight arm pulls cues for core activation Feet on ball K2C, rotation, small bridge, isometric abs Passive LE stretches STM to the right buttock and the bilateral lumbar paraspinals Manual sheet tractoin  05/13/23 Recheck goals for progress note  Gait around parking lot  NuStep L5x32mins  5# straight arm pulls 2x10 AR press 10# 2x10 Calf stretch 30s  Supine LE stretches- LTR, HS, SKTC, figure 4    05/07/23 Bike level 5 x 6 minutes 5# straight arm pulls 5 # hip extension some pain Feet on ball K2C, rotation, small bridge, isometric abs Manual sheet traction STM with the Tgun in the right low back and buttock  05/05/23 Bike level 5 x 6 minutes Gait outside around the parking [place] 5# straight arm pulls 15# lats Feet on ball K2C, rotation, bridges, isometric abs Manual sheet traction STM to the right low back and buttock  05/01/23 Bike level 4 x  minute 5# straight arm pulls 5# AR press Hip extension no weight Hip abduction no weight Feet on ball K2C, trunk rotation, posterior isometrics and isometric abs STM to back and hips Manual sheet traction gently  04/28/23 Bike level 4 x 5 minutes with towel roll behind back 5# straight arm pulls cues for posture and core activation Feet on ball K2C, rotation, bridge, isometric abs Passive LE stretches STM to the right low back and buttock Tgun to above Manual sheet traction  04/24/23 Gait walking around the back building, she reported no increase of LBP but was already pain at a 7/10, she  reports that she did not have to push on her back with her hands Feet on ball K2C, rotation, small bridge, isometric abs Passive LE stretches Passive gentle sheet lumbar traction STM with the T-gun to the lumbar area and the glutes  04/21/23 Gait outside around the parking [employer] level 4 x 5 minutes 5# straight arm pulls mild increase of LBP Feet on ball K2C, rotation, smaal posterior activation , isometric abs Passive LE stretches STM with the T-gun to the right low back and buttock on the right Manual pelvic traction  04/17/23 Gait outside descent pace, this does increase pain Bike level 3 x 5 minutes PROM LE's STM with the T-gun to the low back and buttock Gentle manual sheet traction  04/15/23 Nustep level 5 x 6 minutes Bike level 3 x 5 minutes Leg press 20# and no weight  5# straight arm pulls STM to the right low back and buttock Gentle manual pelvic traction with sheet  04/10/23 Sit ft pelvic ROM 4 way s 15 x each Nustep L 5 Red tband seated trunk ext 2 sets 10,10 x each trunk rotation Iso abs with ball 10 x hold 3 sec Feet on ball bridge, KTC and obl ADD ball squeeze Clams and hip flex red tband 2 sets 10 Passive stretch to the LE's Very gentle manual sheet traction lumbar   04/08/23 STM with the Tgun to the right low back and right buttock Feet on ball K2C, small rotation, small bridge and isometric abs Passive stretch to the LE's Very gentle manual sheet traction lumbar  MHP/IFC to the right low back in supine  feet elevated  04/03/23 Nustep L 5 6 min Sitting on dyna disc pelvic ROM 10x for pelvic ROM and stab Sitting on dyna disc for stab LAQ,hip flex,hip abd 10 x each Sitting on dyna disc shld row and ext red tband 2 sets 10 Supine LE stretches- HS, LTR, SKTC, piriformis, glutes RT LE NT stretching-positive MH/IFC RT LB supine   04/01/23 Walk outdoors  NuStep L5x59mins  Seated row 20# 2x10 Lat pull down 20# 2x10 BlackTB ext 2x10 Supine LE  stretches- HS, LTR, SKTC, piriformis, glutes    03/26/23 NuStep L5 x 6 min Bridges LE on pball bridges x5, K2C x10, Oblq   Passive stretching to HS, piriformis, ITB, Glute Shoulder Ext 5lb x10 Standing rows 5lb x12  03/20/23 Evaluation of the lumbar spine order from Dr. Ellery Guthrie  PATIENT EDUCATION:  Education details: POC and HEP Person educated: Patient Education method: Explanation Education comprehension: verbalized understanding  HOME EXERCISE PROGRAM: Access Code: [facility] Access Code: [email or web address] URL: [email or web address] Date: 03/20/2023 Prepared by: Cherylene Corrente  Exercises - Hooklying Single Knee to Chest Stretch  - 2 x daily - 7 x weekly - 1 sets - 10 reps - 5 hold - Supine Double Knee to Chest  - 2 x daily - 7 x weekly - 1 sets - 10 reps - 5 hold - Supine Lower Trunk Rotation with PLB  - 2 x daily - 7 x weekly - 1 sets - 10 reps - 5 hold - Supine Posterior Pelvic Tilt  - 2 x daily - 7 x weekly - 1 sets - 10 reps - 3 hold  ASSESSMENT:  CLINICAL IMPRESSION:   Awaiting results of the lumbar MRI, she has appointment with the back surgeon on May 9th.  We have been fairly conservative with treatment as the STM and the sheet traction help the pain for about a day.  She did have some pain today with AR press  OBJECTIVE IMPAIRMENTS: Abnormal gait, cardiopulmonary status limiting activity, decreased activity tolerance, decreased endurance, decreased mobility, difficulty walking, decreased ROM, decreased strength, increased muscle spasms, impaired flexibility, improper body mechanics, postural dysfunction, and pain.   REHAB POTENTIAL: Good  CLINICAL DECISION MAKING: Stable/uncomplicated  EVALUATION COMPLEXITY: Low   GOALS: Goals reviewed with patient? Yes  SHORT TERM GOALS: Target date: 10/24/22  Patient will be independent with initial HEP. Goal status: 09/20/22 met  2.  Patient will be do TUG < 30s Baseline: 1 min 28s  Goal status: ongoing 10/07/22,  18.57s MET 10/09/22  3.  Patient will be able to do 5 sit to stands  Baseline: unable to do  Goal status: progressing 10/04/22, MET 10/09/22   LONG TERM GOALS: Target date: 05/18/23  Patient will be independent with advanced/ongoing HEP to improve outcomes and carryover.  Goal status: ongoing 11/06/22  2.  Patient will report at least 75% improvement in R knee pain to improve QOL. Baseline: 10/10 Goal status: met 02/03/23 60% better 11/20/22, 65% 12/06/22, 70% 12/17/22  3.  Patient will demonstrate improved R knee AROM to >/= 0-120 deg to allow for normal gait and stair mechanics. Baseline: 30-0-60 10/09/22: 16-0-78  11/01/22: 14-0-83 11/20/22: 10-97 12/06/22: 10-0-100 12/17/22: 8-0-104 05/13/23: 5-0-115 Goal status: continue to progress and getting closer 0-112  03/13/23. Ongoing 05/13/23  4.  Patient will demonstrate improved functional LE strength as demonstrated by 5/5. Baseline: 2/5 Goal status: MET 3+/5 10/09/22, 5/5 11/01/22   5.  Patient will be able to ambulate 500' with LRAD and normal gait pattern without increased  pain to access community.  Baseline: using RW small in home ambulation distances Goal status: progressing w/SPC 12/02/22, doing some walking without AD, MET 05/13/23  6.  Patient will report 59 on FOTO (patient reported outcome measure) to demonstrate improved functional ability. Baseline: 4, 40-11/11/24 Goal status: MET 53 12/17/22  7.  Decrease lumbar pain 50%  Goal status: ongoing 05/01/23, 45% 05/13/23  8.  Increase lumbar ROM 25%  Goal status: ongoing 04/24/23, ongoing 05/13/23-- extension, L side bending, and R rotation is very limited and painful   9.  Report able to put shoes and socks on without pain > 4/10  Goal status: ongoing still pain 7/10 05/01/23, MET 05/13/23  PLAN:  PT FREQUENCY: 2x/week  PT DURATION: 12 weeks  PLANNED INTERVENTIONS: Therapeutic exercises, Therapeutic activity, Neuromuscular re-education, Balance training, Gait training,  Patient/Family education, Self Care, Joint mobilization, Stair training, Electrical stimulation, Cryotherapy, Moist heat, scar mobilization, Vasopneumatic device, and Manual therapy  PLAN FOR NEXT SESSION: continue conservative until MRI   Patient Details  Name: [name] MRN: [id number] Date of Birth: 1960-07-27 Referring Provider:  Elna Haggis, MD  Encounter Date: 05/19/2023   Hollis Lurie, PT 05/19/2023, 3:33 PM

## 2023-05-20 ENCOUNTER — Ambulatory Visit (INDEPENDENT_AMBULATORY_CARE_PROVIDER_SITE_OTHER): Payer: PRIVATE HEALTH INSURANCE | Admitting: Professional Counselor

## 2023-05-20 ENCOUNTER — Ambulatory Visit
Admission: RE | Admit: 2023-05-20 | Discharge: 2023-05-20 | Disposition: A | Discharge status: Home / Self Care | Source: Ambulatory Visit | Attending: Internal Medicine | Admitting: Internal Medicine

## 2023-05-20 ENCOUNTER — Encounter: Payer: Self-pay | Admitting: Professional Counselor

## 2023-05-20 DIAGNOSIS — F411 Generalized anxiety disorder: Secondary | ICD-10-CM | POA: Diagnosis not present

## 2023-05-20 DIAGNOSIS — F331 Major depressive disorder, recurrent, moderate: Secondary | ICD-10-CM | POA: Diagnosis not present

## 2023-05-20 DIAGNOSIS — E042 Nontoxic multinodular goiter: Secondary | ICD-10-CM

## 2023-05-20 NOTE — Progress Notes (Signed)
 [facility]       [facility] Counselor/Therapist Progress Note  Patient ID: [name], MRN: [id number],    Date: 05/20/2023  Time Spent: 4:15 PM to 5:18 PM  Treatment Type: Family with patient  Patient present with spouse for session.  Reported Symptoms: Worries, low mood, restlessness, irritability, interpersonal conflict, health concerns, sadness, stress, emotional dysregulation, fatigue, sleep concerns  Mental Status Exam:  Appearance:   Casual     Behavior:  Appropriate, Sharing, and Motivated  Motor:  Normal  Speech/Language:   Clear and Coherent and Normal Rate  Affect:  Appropriate and Congruent  Mood:  normal  Thought process:  normal  Thought content:    WNL  Sensory/Perceptual disturbances:    WNL  Orientation:  oriented to person, place, time/date, and situation  Attention:  Good  Concentration:  Good  Memory:  WNL  Fund of knowledge:   Good  Insight:    Good  Judgment:   Good  Impulse Control:  Good   Risk Assessment: Danger to Self:  yes vague SI no intent/plan Self-injurious Behavior: No Danger to Others: No Duty to Warn:no Physical Aggression / Violence:No  Access to Firearms a concern: No  Gang Involvement:No   Subjective: Patient presented to session to address concerns of anxiety and depression.  She presented to session with her husband.  She reported mixed progress at this time.  She updated counselor on developments with her health including a "suspicious" concern with her thyroid , and to have diagnosis of encapsulated breast implant.  Spouse also identified health concerns of his own.  Patient reported spouse to have been abstaining from marijuana use as discussed and agreed upon last session; patient's spouse reported feeling fine with development.  Patient reported having been to meeting with financial advisor today with husband and to be hopeful the service will help their marital concerns around finances, which causes considerable anxiety for patient.   Patient and spouse processed experience of recent conflicts and counselor helped identify patterns and themes to arguments.  They discussed patient and spouse sense of overwhelm and emotional dysregulation as a result, and patient identified how she feels she is trying to get her needs met and be heard when she experiences trauma cues in their interactions, and spouse voiced that he feels criticized and berated, and that patient perseverates.  Counselor helped couple to identify skills to utilize: Notice when either is beginning to escalate, take space as needed, be mindful of capacity to choose reactions in the moment including resourcing of coping skills and compassion for self and other, and resource quietude.  Counselor provided psychoeducation on mirror neurons and attunement, and both taking responsibility for their anger and anxiety, and collaborating to support 1 another.  Interventions: Solution-Oriented/Positive Psychology, Humanistic/Existential, Insight-Oriented, and Family Systems  Diagnosis:   ICD-10-CM   1. Generalized anxiety disorder  F41.1     2. Major depressive disorder, recurrent episode, moderate (HCC)  F33.1       Plan: Patient is scheduled for follow-up; continue process work and developing coping skills.  Patient short-term goal between sessions to work on strategies for emotional dysregulation and conflict resolution as discussed above.  Progress note was dictated with Dragon and reviewed for accuracy.  Anthon Kins, [facility]

## 2023-05-22 ENCOUNTER — Encounter: Payer: Self-pay | Admitting: Physical Therapy

## 2023-05-22 ENCOUNTER — Ambulatory Visit: Attending: Neurological Surgery | Admitting: Physical Therapy

## 2023-05-22 DIAGNOSIS — M25561 Pain in right knee: Secondary | ICD-10-CM | POA: Diagnosis present

## 2023-05-22 DIAGNOSIS — M6281 Muscle weakness (generalized): Secondary | ICD-10-CM | POA: Insufficient documentation

## 2023-05-22 DIAGNOSIS — M25661 Stiffness of right knee, not elsewhere classified: Secondary | ICD-10-CM | POA: Insufficient documentation

## 2023-05-22 DIAGNOSIS — G8929 Other chronic pain: Secondary | ICD-10-CM | POA: Diagnosis present

## 2023-05-22 DIAGNOSIS — M549 Dorsalgia, unspecified: Secondary | ICD-10-CM | POA: Insufficient documentation

## 2023-05-22 DIAGNOSIS — R2689 Other abnormalities of gait and mobility: Secondary | ICD-10-CM | POA: Diagnosis present

## 2023-05-22 DIAGNOSIS — Z96651 Presence of right artificial knee joint: Secondary | ICD-10-CM | POA: Insufficient documentation

## 2023-05-22 DIAGNOSIS — R600 Localized edema: Secondary | ICD-10-CM | POA: Insufficient documentation

## 2023-05-22 NOTE — Therapy (Signed)
 OUTPATIENT PHYSICAL THERAPY LOWER EXTREMITY TREATMENT    Patient Name: [name] MRN: [id number] DOB:December 20, 1960, 63 y.o., female Today's Date: 05/22/2023  END OF SESSION:  PT End of Session - 05/22/23 1624     Visit Number 83    Date for PT Re-Evaluation 07/19/23    Authorization Type [employer]    PT Start Time 1530    PT Stop Time 1615    PT Time Calculation (min) 45 min    Activity Tolerance Patient tolerated treatment well    Behavior During Therapy WFL for tasks assessed/performed               Past Medical History:  Diagnosis Date   Arthritis    Back pain of lumbar region with sciatica    Cancer (HCC)    breast   Depression    Diabetes mellitus, type II (HCC)    Family history of anesthesia complication    grandfather died under anesthesia 70's- had heart issues   H/O carpal tunnel repair 2014   Headache(784.0)    Hyperlipidemia    Hypertension    Hypothyroidism    Medial meniscus tear    PONV (postoperative nausea and vomiting)    Sleep apnea    cpap since 10   Past Surgical History:  Procedure Laterality Date   BILATERAL TOTAL MASTECTOMY WITH AXILLARY LYMPH NODE DISSECTION     BREAST RECONSTRUCTION     CERVICAL DISC ARTHROPLASTY N/A 03/19/2013   Procedure: CERVICAL FIVE TO SIX, CERVICAL SIX TO SEVEN CERVICAL ANTERIOR DISC ARTHROPLASTY;  Surgeon: Elna Haggis, MD;  Location: [facility];  Service: Neurosurgery;  Laterality: N/A;  C56 C67 artificial disc replacement   CESAREAN SECTION     COLONOSCOPY     DILATATION & CURETTAGE/HYSTEROSCOPY WITH TRUECLEAR N/A 11/25/2013   Procedure: DILATATION & CURETTAGE/HYSTEROSCOPY WITH TRUCLEAR;  Surgeon: Piedad Brewer, MD;  Location: [facility];  Service: Gynecology;  Laterality: N/A;   ECTOPIC PREGNANCY SURGERY     GALLBLADDER SURGERY     MOUTH SURGERY     child- dog bite   SPINAL FUSION     2023   STERIOD INJECTION Left 09/09/2022   Procedure: LEFT KNEE STEROID INJECTION;  Surgeon: Liliane Rei, MD;   Location: [facility];  Service: Orthopedics;  Laterality: Left;  left knee injection 0928   TONSILLECTOMY     TOTAL KNEE ARTHROPLASTY Right 09/09/2022   Procedure: TOTAL KNEE ARTHROPLASTY;  Surgeon: Liliane Rei, MD;  Location: [facility];  Service: Orthopedics;  Laterality: Right;   TUBAL LIGATION     Patient Active Problem List   Diagnosis Date Noted   OA (osteoarthritis) of knee 09/09/2022   Exertional dyspnea 04/17/2022   Elevated coronary artery calcium  score 04/17/2022   Spondylolisthesis at L4-L5 level 12/27/2021   Ductal carcinoma in situ (DCIS) of left breast 01/19/2018   Generalized anxiety disorder 11/07/2017   Depression 11/07/2017   Insomnia 11/07/2017   Cervical spondylosis 03/19/2013    PCP: Margarete Sharps  REFERRING PROVIDER: Athena Lazier  REFERRING DIAG: R TKA 09/09/22  THERAPY DIAG:  Chronic bilateral back pain, unspecified back location  Muscle weakness (generalized)  S/P total knee arthroplasty, right  Acute pain of right knee  Stiffness of right knee, not elsewhere classified  Localized edema  Rationale for Evaluation and Treatment: Rehabilitation  ONSET DATE: 09/09/22  SUBJECTIVE:   SUBJECTIVE STATEMENT :  Had injection in the left knee yesterday so we did not do any stress on the knees today, still awaiting  results of MRI on the back and then for her to see the back surgeon  R TKA 8/19 L knee steroid injection 09/09/22  PAIN:  Are you having pain? Yes: NPRS scale: 6/10 Pain location: right low back, buttock and reports around to the front of the thigh Pain description: sharp really hurts Aggravating factors: difficulty sleeping, difficulty walking and standing and sitting Relieving factors: heat, Aleve , tramadol  and methacarbomol  PRECAUTIONS: None  RED FLAGS: None   WEIGHT BEARING RESTRICTIONS: No  FALLS:  Has patient fallen in last 6 months? No  LIVING ENVIRONMENT: Lives with: lives with their family and lives with their spouse Lives  in: House/apartment Stairs: Yes: External: 3 steps; none Has following equipment at home: Single point cane and Walker - 2 wheeled  OCCUPATION: Not working  PLOF: Independent  PATIENT GOALS: less back pain  NEXT MD VISIT: 09/24/22  OBJECTIVE:   PATIENT SURVEYS:  FOTO 4  COGNITION: Overall cognitive status: Within functional limits for tasks assessed      EDEMA:  Increased swelling surrounding R knee  POSTURE: rounded shoulders and flexed trunk   PALPATION: Warm and TTP  LOWER EXTREMITY ROM:  Active ROM Right eval Left eval Right 09/24/22 Right 09/26/22 Right 10/02/22  11/01/22 12/09/22  Hip flexion          Hip extension          Hip abduction          Hip adduction          Hip internal rotation          Hip external rotation          Knee flexion  60  75* AROM, 86 PROM 82* AROM seated per pt request  78* self AAROM, 76* AROM  PROM 87 AROM 81 PROM 90 AROM 83 AROM 101 PROM 105  Knee extension -30  -15* AROM seated, -15 PROM supine   -16* supine heel prop with quad set  AROM 25 PROM 16 AROM 14 AROM 5 PROM 0  Ankle dorsiflexion          Ankle plantarflexion          Ankle inversion          Ankle eversion           (Blank rows = not tested)  LOWER EXTREMITY MMT:  MMT Right eval Left eval  Hip flexion 2   Hip extension    Hip abduction    Hip adduction    Hip internal rotation    Hip external rotation    Knee flexion 2   Knee extension 2   Ankle dorsiflexion    Ankle plantarflexion    Ankle inversion    Ankle eversion     (Blank rows = not tested)  LUMBAR ROM:  decreased 50% flexion, decreased 75% for extension and side bending, pain was worse with extension and right side bending. Hooklying is less painful that lying flat on back Mild tightness with pain during HS stretch, tight and painful piriformis bilaterally worse on the right , very weak core  FUNCTIONAL TESTS:  5 times sit to stand: 30 seconds 03/20/23 Timed up and go (TUG): 1 min  28s  GAIT: Is no without assistive device, mild antalgic on the right    TODAY'S TREATMENT:  DATE:  05/22/23 PROM LE's STM to the buttocks, HS and Lumbar area into the thoracic area Manual sheet traction  05/19/23 Bike level 4 x 6 minutes 5# straight arm pulls Seated rows 20# Lats 20# 10# AR press some pain in leg so we stopped Feet on ball K2C, rotation, bridges, isometric abs Passive stretch, STM to the lumbar area left, sheet traction   05/15/23 Bike level 5 x 5 minutes 5# straight arm pulls cues for core activation Feet on ball K2C, rotation, small bridge, isometric abs Passive LE stretches STM to the right buttock and the bilateral lumbar paraspinals Manual sheet tractoin  05/13/23 Recheck goals for progress note  Gait around parking lot  NuStep L5x4mins  5# straight arm pulls 2x10 AR press 10# 2x10 Calf stretch 30s  Supine LE stretches- LTR, HS, SKTC, figure 4    05/07/23 Bike level 5 x 6 minutes 5# straight arm pulls 5 # hip extension some pain Feet on ball K2C, rotation, small bridge, isometric abs Manual sheet traction STM with the Tgun in the right low back and buttock  05/05/23 Bike level 5 x 6 minutes Gait outside around the parking [place] 5# straight arm pulls 15# lats Feet on ball K2C, rotation, bridges, isometric abs Manual sheet traction STM to the right low back and buttock  05/01/23 Bike level 4 x  minute 5# straight arm pulls 5# AR press Hip extension no weight Hip abduction no weight Feet on ball K2C, trunk rotation, posterior isometrics and isometric abs STM to back and hips Manual sheet traction gently  04/28/23 Bike level 4 x 5 minutes with towel roll behind back 5# straight arm pulls cues for posture and core activation Feet on ball K2C, rotation, bridge, isometric abs Passive LE stretches STM to the right  low back and buttock Tgun to above Manual sheet traction  04/24/23 Gait walking around the back building, she reported no increase of LBP but was already pain at a 7/10, she reports that she did not have to push on her back with her hands Feet on ball K2C, rotation, small bridge, isometric abs Passive LE stretches Passive gentle sheet lumbar traction STM with the T-gun to the lumbar area and the glutes  04/21/23 Gait outside around the parking [employer] level 4 x 5 minutes 5# straight arm pulls mild increase of LBP Feet on ball K2C, rotation, smaal posterior activation , isometric abs Passive LE stretches STM with the T-gun to the right low back and buttock on the right Manual pelvic traction  04/17/23 Gait outside descent pace, this does increase pain Bike level 3 x 5 minutes PROM LE's STM with the T-gun to the low back and buttock Gentle manual sheet traction  04/15/23 Nustep level 5 x 6 minutes Bike level 3 x 5 minutes Leg press 20# and no weight  5# straight arm pulls STM to the right low back and buttock Gentle manual pelvic traction with sheet  04/10/23 Sit ft pelvic ROM 4 way s 15 x each Nustep L 5 Red tband seated trunk ext 2 sets 10,10 x each trunk rotation Iso abs with ball 10 x hold 3 sec Feet on ball bridge, KTC and obl ADD ball squeeze Clams and hip flex red tband 2 sets 10 Passive stretch to the LE's Very gentle manual sheet traction lumbar   04/08/23 STM with the Tgun to the right low back and right buttock Feet on ball K2C, small rotation, small bridge and isometric abs Passive  stretch to the LE's Very gentle manual sheet traction lumbar  MHP/IFC to the right low back in supine feet elevated  04/03/23 Nustep L 5 6 min Sitting on dyna disc pelvic ROM 10x for pelvic ROM and stab Sitting on dyna disc for stab LAQ,hip flex,hip abd 10 x each Sitting on dyna disc shld row and ext red tband 2 sets 10 Supine LE stretches- HS, LTR, SKTC, piriformis,  glutes RT LE NT stretching-positive MH/IFC RT LB supine   04/01/23 Walk outdoors  NuStep L5x36mins  Seated row 20# 2x10 Lat pull down 20# 2x10 BlackTB ext 2x10 Supine LE stretches- HS, LTR, SKTC, piriformis, glutes    03/26/23 NuStep L5 x 6 min Bridges LE on pball bridges x5, K2C x10, Oblq   Passive stretching to HS, piriformis, ITB, Glute Shoulder Ext 5lb x10 Standing rows 5lb x12  03/20/23 Evaluation of the lumbar spine order from Dr. Ellery Guthrie  PATIENT EDUCATION:  Education details: POC and HEP Person educated: Patient Education method: Explanation Education comprehension: verbalized understanding  HOME EXERCISE PROGRAM: Access Code: [employer] Access Code: [email or web address] URL: [email or web address] Date: 03/20/2023 Prepared by: Cherylene Corrente  Exercises - Hooklying Single Knee to Chest Stretch  - 2 x daily - 7 x weekly - 1 sets - 10 reps - 5 hold - Supine Double Knee to Chest  - 2 x daily - 7 x weekly - 1 sets - 10 reps - 5 hold - Supine Lower Trunk Rotation with PLB  - 2 x daily - 7 x weekly - 1 sets - 10 reps - 5 hold - Supine Posterior Pelvic Tilt  - 2 x daily - 7 x weekly - 1 sets - 10 reps - 3 hold  ASSESSMENT:  CLINICAL IMPRESSION:   Awaiting results of the lumbar MRI, she has appointment with the back surgeon on May 9th.  We have been fairly conservative with treatment as the STM and the sheet traction help the pain for about a day.  Had injection in the knee yesterday so we avoided standing and a lot of activity  OBJECTIVE IMPAIRMENTS: Abnormal gait, cardiopulmonary status limiting activity, decreased activity tolerance, decreased endurance, decreased mobility, difficulty walking, decreased ROM, decreased strength, increased muscle spasms, impaired flexibility, improper body mechanics, postural dysfunction, and pain.   REHAB POTENTIAL: Good  CLINICAL DECISION MAKING: Stable/uncomplicated  EVALUATION COMPLEXITY: Low   GOALS: Goals reviewed with  patient? Yes  SHORT TERM GOALS: Target date: 10/24/22  Patient will be independent with initial HEP. Goal status: 09/20/22 met  2.  Patient will be do TUG < 30s Baseline: 1 min 28s  Goal status: ongoing 10/07/22, 18.57s MET 10/09/22  3.  Patient will be able to do 5 sit to stands  Baseline: unable to do  Goal status: progressing 10/04/22, MET 10/09/22   LONG TERM GOALS: Target date: 05/18/23  Patient will be independent with advanced/ongoing HEP to improve outcomes and carryover.  Goal status: ongoing 11/06/22  2.  Patient will report at least 75% improvement in R knee pain to improve QOL. Baseline: 10/10 Goal status: met 02/03/23 60% better 11/20/22, 65% 12/06/22, 70% 12/17/22  3.  Patient will demonstrate improved R knee AROM to >/= 0-120 deg to allow for normal gait and stair mechanics. Baseline: 30-0-60 10/09/22: 16-0-78  11/01/22: 14-0-83 11/20/22: 10-97 12/06/22: 10-0-100 12/17/22: 8-0-104 05/13/23: 5-0-115 Goal status: continue to progress and getting closer 0-112  03/13/23. Ongoing 05/13/23  4.  Patient will demonstrate improved functional LE strength as demonstrated by 5/5. Baseline: 2/5 Goal  status: MET 3+/5 10/09/22, 5/5 11/01/22   5.  Patient will be able to ambulate 500' with LRAD and normal gait pattern without increased pain to access community.  Baseline: using RW small in home ambulation distances Goal status: progressing w/SPC 12/02/22, doing some walking without AD, MET 05/13/23  6.  Patient will report 25 on FOTO (patient reported outcome measure) to demonstrate improved functional ability. Baseline: 4, 40-11/11/24 Goal status: MET 53 12/17/22  7.  Decrease lumbar pain 50%  Goal status: ongoing 05/01/23, 45% 05/13/23  8.  Increase lumbar ROM 25%  Goal status: ongoing 04/24/23, ongoing 05/13/23-- extension, L side bending, and R rotation is very limited and painful   9.  Report able to put shoes and socks on without pain > 4/10  Goal status: ongoing still pain  7/10 05/01/23, MET 05/13/23  PLAN:  PT FREQUENCY: 2x/week  PT DURATION: 12 weeks  PLANNED INTERVENTIONS: Therapeutic exercises, Therapeutic activity, Neuromuscular re-education, Balance training, Gait training, Patient/Family education, Self Care, Joint mobilization, Stair training, Electrical stimulation, Cryotherapy, Moist heat, scar mobilization, Vasopneumatic device, and Manual therapy  PLAN FOR NEXT SESSION: continue conservative until MRI   Patient Details  Name: [name] MRN: [id number] Date of Birth: [birth year] Referring Provider:  Elna Haggis, MD  Encounter Date: 05/22/2023   Hollis Lurie, PT 05/22/2023, 4:24 PM

## 2023-05-26 ENCOUNTER — Encounter: Payer: Self-pay | Admitting: Physical Therapy

## 2023-05-26 ENCOUNTER — Ambulatory Visit: Admitting: Physical Therapy

## 2023-05-26 DIAGNOSIS — G8929 Other chronic pain: Secondary | ICD-10-CM

## 2023-05-26 DIAGNOSIS — M25661 Stiffness of right knee, not elsewhere classified: Secondary | ICD-10-CM

## 2023-05-26 DIAGNOSIS — M549 Dorsalgia, unspecified: Secondary | ICD-10-CM | POA: Diagnosis not present

## 2023-05-26 DIAGNOSIS — M25561 Pain in right knee: Secondary | ICD-10-CM

## 2023-05-26 DIAGNOSIS — M6281 Muscle weakness (generalized): Secondary | ICD-10-CM

## 2023-05-26 DIAGNOSIS — Z96651 Presence of right artificial knee joint: Secondary | ICD-10-CM

## 2023-05-26 NOTE — Therapy (Signed)
 OUTPATIENT PHYSICAL THERAPY LOWER EXTREMITY TREATMENT    Patient Name: [name] MRN: [id number] DOB:18-Apr-1960, 63 y.o., female Today's Date: 05/26/2023  END OF SESSION:  PT End of Session - 05/26/23 1451     Visit Number 84    Date for PT Re-Evaluation 07/19/23    Authorization Type [employer]    PT Start Time 1445    PT Stop Time 1530    PT Time Calculation (min) 45 min    Activity Tolerance Patient tolerated treatment well    Behavior During Therapy WFL for tasks assessed/performed               Past Medical History:  Diagnosis Date   Arthritis    Back pain of lumbar region with sciatica    Cancer (HCC)    breast   Depression    Diabetes mellitus, type II (HCC)    Family history of anesthesia complication    grandfather died under anesthesia 70's- had heart issues   H/O carpal tunnel repair 2014   Headache(784.0)    Hyperlipidemia    Hypertension    Hypothyroidism    Medial meniscus tear    PONV (postoperative nausea and vomiting)    Sleep apnea    cpap since 10   Past Surgical History:  Procedure Laterality Date   BILATERAL TOTAL MASTECTOMY WITH AXILLARY LYMPH NODE DISSECTION     BREAST RECONSTRUCTION     CERVICAL DISC ARTHROPLASTY N/A 03/19/2013   Procedure: CERVICAL FIVE TO SIX, CERVICAL SIX TO SEVEN CERVICAL ANTERIOR DISC ARTHROPLASTY;  Surgeon: Elna Haggis, MD;  Location: [facility];  Service: Neurosurgery;  Laterality: N/A;  C56 C67 artificial disc replacement   CESAREAN SECTION     COLONOSCOPY     DILATATION & CURETTAGE/HYSTEROSCOPY WITH TRUECLEAR N/A 11/25/2013   Procedure: DILATATION & CURETTAGE/HYSTEROSCOPY WITH TRUCLEAR;  Surgeon: Piedad Brewer, MD;  Location: [facility];  Service: Gynecology;  Laterality: N/A;   ECTOPIC PREGNANCY SURGERY     GALLBLADDER SURGERY     MOUTH SURGERY     child- dog bite   SPINAL FUSION     2023   STERIOD INJECTION Left 09/09/2022   Procedure: LEFT KNEE STEROID INJECTION;  Surgeon: Liliane Rei, MD;   Location: [facility];  Service: Orthopedics;  Laterality: Left;  left knee injection 0928   TONSILLECTOMY     TOTAL KNEE ARTHROPLASTY Right 09/09/2022   Procedure: TOTAL KNEE ARTHROPLASTY;  Surgeon: Liliane Rei, MD;  Location: [facility];  Service: Orthopedics;  Laterality: Right;   TUBAL LIGATION     Patient Active Problem List   Diagnosis Date Noted   OA (osteoarthritis) of knee 09/09/2022   Exertional dyspnea 04/17/2022   Elevated coronary artery calcium  score 04/17/2022   Spondylolisthesis at L4-L5 level 12/27/2021   Ductal carcinoma in situ (DCIS) of left breast 01/19/2018   Generalized anxiety disorder 11/07/2017   Depression 11/07/2017   Insomnia 11/07/2017   Cervical spondylosis 03/19/2013    PCP: Margarete Sharps  REFERRING PROVIDER: Athena Lazier  REFERRING DIAG: R TKA 09/09/22  THERAPY DIAG:  Chronic bilateral back pain, unspecified back location  Muscle weakness (generalized)  S/P total knee arthroplasty, right  Acute pain of right knee  Stiffness of right knee, not elsewhere classified  Rationale for Evaluation and Treatment: Rehabilitation  ONSET DATE: 09/09/22  SUBJECTIVE:   SUBJECTIVE STATEMENT :  Still awaiting results of both the breast and the back MRI.  She went to [facility] over the weekend and was in the  car a total of about 12 hours reports very stiff R TKA 8/19 L knee steroid injection 09/09/22  PAIN:  Are you having pain? Yes: NPRS scale: 6/10 Pain location: right low back, buttock and reports around to the front of the thigh Pain description: sharp really hurts Aggravating factors: difficulty sleeping, difficulty walking and standing and sitting Relieving factors: heat, Aleve , tramadol  and methacarbomol  PRECAUTIONS: None  RED FLAGS: None   WEIGHT BEARING RESTRICTIONS: No  FALLS:  Has patient fallen in last 6 months? No  LIVING ENVIRONMENT: Lives with: lives with their family and lives with their spouse Lives in: House/apartment Stairs: Yes:  External: 3 steps; none Has following equipment at home: Single point cane and Walker - 2 wheeled  OCCUPATION: Not working  PLOF: Independent  PATIENT GOALS: less back pain  NEXT MD VISIT: 09/24/22  OBJECTIVE:   PATIENT SURVEYS:  FOTO 4  COGNITION: Overall cognitive status: Within functional limits for tasks assessed      EDEMA:  Increased swelling surrounding R knee  POSTURE: rounded shoulders and flexed trunk   PALPATION: Warm and TTP  LOWER EXTREMITY ROM:  Active ROM Right eval Left eval Right 09/24/22 Right 09/26/22 Right 10/02/22  11/01/22 12/09/22  Hip flexion          Hip extension          Hip abduction          Hip adduction          Hip internal rotation          Hip external rotation          Knee flexion  60  75* AROM, 86 PROM 82* AROM seated per pt request  78* self AAROM, 76* AROM  PROM 87 AROM 81 PROM 90 AROM 83 AROM 101 PROM 105  Knee extension -30  -15* AROM seated, -15 PROM supine   -16* supine heel prop with quad set  AROM 25 PROM 16 AROM 14 AROM 5 PROM 0  Ankle dorsiflexion          Ankle plantarflexion          Ankle inversion          Ankle eversion           (Blank rows = not tested)  LOWER EXTREMITY MMT:  MMT Right eval Left eval  Hip flexion 2   Hip extension    Hip abduction    Hip adduction    Hip internal rotation    Hip external rotation    Knee flexion 2   Knee extension 2   Ankle dorsiflexion    Ankle plantarflexion    Ankle inversion    Ankle eversion     (Blank rows = not tested)  LUMBAR ROM:  decreased 50% flexion, decreased 75% for extension and side bending, pain was worse with extension and right side bending. Hooklying is less painful that lying flat on back Mild tightness with pain during HS stretch, tight and painful piriformis bilaterally worse on the right , very weak core  FUNCTIONAL TESTS:  5 times sit to stand: 30 seconds 03/20/23 Timed up and go (TUG): 1 min 28s  GAIT: Is no without assistive device,  mild antalgic on the right    TODAY'S TREATMENT:  DATE:  05/26/23 Bike level 5 x 6 minutes 5# straight arm pulls 20# rows 2x10 20# lats 2x10 5# AR press Passive stretch LE's STM to the right buttock, HS and low back Manual sheet traction lumbar  05/22/23 PROM LE's STM to the buttocks, HS and Lumbar area into the thoracic area Manual sheet traction  05/19/23 Bike level 4 x 6 minutes 5# straight arm pulls Seated rows 20# Lats 20# 10# AR press some pain in leg so we stopped Feet on ball K2C, rotation, bridges, isometric abs Passive stretch, STM to the lumbar area left, sheet traction   05/15/23 Bike level 5 x 5 minutes 5# straight arm pulls cues for core activation Feet on ball K2C, rotation, small bridge, isometric abs Passive LE stretches STM to the right buttock and the bilateral lumbar paraspinals Manual sheet tractoin  05/13/23 Recheck goals for progress note  Gait around parking lot  NuStep L5x22mins  5# straight arm pulls 2x10 AR press 10# 2x10 Calf stretch 30s  Supine LE stretches- LTR, HS, SKTC, figure 4    05/07/23 Bike level 5 x 6 minutes 5# straight arm pulls 5 # hip extension some pain Feet on ball K2C, rotation, small bridge, isometric abs Manual sheet traction STM with the Tgun in the right low back and buttock  05/05/23 Bike level 5 x 6 minutes Gait outside around the parking [place] 5# straight arm pulls 15# lats Feet on ball K2C, rotation, bridges, isometric abs Manual sheet traction STM to the right low back and buttock  05/01/23 Bike level 4 x  minute 5# straight arm pulls 5# AR press Hip extension no weight Hip abduction no weight Feet on ball K2C, trunk rotation, posterior isometrics and isometric abs STM to back and hips Manual sheet traction gently  04/28/23 Bike level 4 x 5 minutes with towel roll behind  back 5# straight arm pulls cues for posture and core activation Feet on ball K2C, rotation, bridge, isometric abs Passive LE stretches STM to the right low back and buttock Tgun to above Manual sheet traction  04/24/23 Gait walking around the back building, she reported no increase of LBP but was already pain at a 7/10, she reports that she did not have to push on her back with her hands Feet on ball K2C, rotation, small bridge, isometric abs Passive LE stretches Passive gentle sheet lumbar traction STM with the T-gun to the lumbar area and the glutes  04/21/23 Gait outside around the parking [employer] level 4 x 5 minutes 5# straight arm pulls mild increase of LBP Feet on ball K2C, rotation, smaal posterior activation , isometric abs Passive LE stretches STM with the T-gun to the right low back and buttock on the right Manual pelvic traction  04/17/23 Gait outside descent pace, this does increase pain Bike level 3 x 5 minutes PROM LE's STM with the T-gun to the low back and buttock Gentle manual sheet traction  04/15/23 Nustep level 5 x 6 minutes Bike level 3 x 5 minutes Leg press 20# and no weight  5# straight arm pulls STM to the right low back and buttock Gentle manual pelvic traction with sheet  04/10/23 Sit ft pelvic ROM 4 way s 15 x each Nustep L 5 Red tband seated trunk ext 2 sets 10,10 x each trunk rotation Iso abs with ball 10 x hold 3 sec Feet on ball bridge, KTC and obl ADD ball squeeze Clams and hip flex red tband 2 sets 10 Passive  stretch to the LE's Very gentle manual sheet traction lumbar   04/08/23 STM with the Tgun to the right low back and right buttock Feet on ball K2C, small rotation, small bridge and isometric abs Passive stretch to the LE's Very gentle manual sheet traction lumbar  MHP/IFC to the right low back in supine feet elevated  04/03/23 Nustep L 5 6 min Sitting on dyna disc pelvic ROM 10x for pelvic ROM and stab Sitting on dyna  disc for stab LAQ,hip flex,hip abd 10 x each Sitting on dyna disc shld row and ext red tband 2 sets 10 Supine LE stretches- HS, LTR, SKTC, piriformis, glutes RT LE NT stretching-positive MH/IFC RT LB supine   04/01/23 Walk outdoors  NuStep L5x24mins  Seated row 20# 2x10 Lat pull down 20# 2x10 BlackTB ext 2x10 Supine LE stretches- HS, LTR, SKTC, piriformis, glutes    03/26/23 NuStep L5 x 6 min Bridges LE on pball bridges x5, K2C x10, Oblq   Passive stretching to HS, piriformis, ITB, Glute Shoulder Ext 5lb x10 Standing rows 5lb x12  03/20/23 Evaluation of the lumbar spine order from Dr. Ellery Guthrie  PATIENT EDUCATION:  Education details: POC and HEP Person educated: Patient Education method: Explanation Education comprehension: verbalized understanding  HOME EXERCISE PROGRAM: Access Code: [facility] Access Code: [email or web address] URL: [email or web address] Date: 03/20/2023 Prepared by: Cherylene Corrente  Exercises - Hooklying Single Knee to Chest Stretch  - 2 x daily - 7 x weekly - 1 sets - 10 reps - 5 hold - Supine Double Knee to Chest  - 2 x daily - 7 x weekly - 1 sets - 10 reps - 5 hold - Supine Lower Trunk Rotation with PLB  - 2 x daily - 7 x weekly - 1 sets - 10 reps - 5 hold - Supine Posterior Pelvic Tilt  - 2 x daily - 7 x weekly - 1 sets - 10 reps - 3 hold  ASSESSMENT:  CLINICAL IMPRESSION:   Awaiting results of the lumbar MRI, she has appointment with the back surgeon on May 9th.  She also is awaiting further testing and MD visits with the breast and the thyroid .  She was a little stiff today due to being in the car a lot over the weekend,  OBJECTIVE IMPAIRMENTS: Abnormal gait, cardiopulmonary status limiting activity, decreased activity tolerance, decreased endurance, decreased mobility, difficulty walking, decreased ROM, decreased strength, increased muscle spasms, impaired flexibility, improper body mechanics, postural dysfunction, and pain.   REHAB POTENTIAL:  Good  CLINICAL DECISION MAKING: Stable/uncomplicated  EVALUATION COMPLEXITY: Low   GOALS: Goals reviewed with patient? Yes  SHORT TERM GOALS: Target date: 10/24/22  Patient will be independent with initial HEP. Goal status: 09/20/22 met  2.  Patient will be do TUG < 30s Baseline: 1 min 28s  Goal status: ongoing 10/07/22, 18.57s MET 10/09/22  3.  Patient will be able to do 5 sit to stands  Baseline: unable to do  Goal status: progressing 10/04/22, MET 10/09/22   LONG TERM GOALS: Target date: 05/18/23  Patient will be independent with advanced/ongoing HEP to improve outcomes and carryover.  Goal status: ongoing 11/06/22  2.  Patient will report at least 75% improvement in R knee pain to improve QOL. Baseline: 10/10 Goal status: met 02/03/23 60% better 11/20/22, 65% 12/06/22, 70% 12/17/22  3.  Patient will demonstrate improved R knee AROM to >/= 0-120 deg to allow for normal gait and stair mechanics. Baseline: 30-0-60 10/09/22: 16-0-78  11/01/22: 14-0-83 11/20/22: 10-97 12/06/22: 10-0-100 12/17/22:  8-0-104 05/13/23: 5-0-115 Goal status: continue to progress and getting closer 0-112  03/13/23. Ongoing 05/13/23  4.  Patient will demonstrate improved functional LE strength as demonstrated by 5/5. Baseline: 2/5 Goal status: MET 3+/5 10/09/22, 5/5 11/01/22   5.  Patient will be able to ambulate 500' with LRAD and normal gait pattern without increased pain to access community.  Baseline: using RW small in home ambulation distances Goal status: progressing w/SPC 12/02/22, doing some walking without AD, MET 05/13/23  6.  Patient will report 36 on FOTO (patient reported outcome measure) to demonstrate improved functional ability. Baseline: 4, 40-11/11/24 Goal status: MET 53 12/17/22  7.  Decrease lumbar pain 50%  Goal status: ongoing 05/01/23, 45% 05/13/23  8.  Increase lumbar ROM 25%  Goal status: ongoing 04/24/23, ongoing 05/13/23-- extension, L side bending, and R rotation is very  limited and painful   9.  Report able to put shoes and socks on without pain > 4/10  Goal status: ongoing still pain 7/10 05/01/23, MET 05/13/23  PLAN:  PT FREQUENCY: 2x/week  PT DURATION: 12 weeks  PLANNED INTERVENTIONS: Therapeutic exercises, Therapeutic activity, Neuromuscular re-education, Balance training, Gait training, Patient/Family education, Self Care, Joint mobilization, Stair training, Electrical stimulation, Cryotherapy, Moist heat, scar mobilization, Vasopneumatic device, and Manual therapy  PLAN FOR NEXT SESSION: continue conservative until MRI   Patient Details  Name: [name] MRN: [id number] Date of Birth: 23-Aug-1960 Referring Provider:  Margarete Sharps, MD  Encounter Date: 05/26/2023   Hollis Lurie, PT 05/26/2023, 2:51 PM

## 2023-05-27 ENCOUNTER — Ambulatory Visit: Payer: PRIVATE HEALTH INSURANCE | Admitting: Professional Counselor

## 2023-05-27 ENCOUNTER — Encounter: Payer: Self-pay | Admitting: Professional Counselor

## 2023-05-27 DIAGNOSIS — F411 Generalized anxiety disorder: Secondary | ICD-10-CM

## 2023-05-27 DIAGNOSIS — F331 Major depressive disorder, recurrent, moderate: Secondary | ICD-10-CM

## 2023-05-27 NOTE — Progress Notes (Signed)
      [facility] Counselor/Therapist Progress Note  Patient ID: [name], MRN: [id number],    Date: 05/27/2023  Time Spent: 1:13 PM to 2:13 PM  Treatment Type: Family with patient  Patient presented to session with spouse.  Reported Symptoms: Worries, fears, stress, restlessness, intermittent low mood, nervousness, interpersonal concerns, phase of life concerns, health concerns  Mental Status Exam:  Appearance:   Neat     Behavior:  Appropriate, Sharing, and Motivated  Motor:  Normal  Speech/Language:   Clear and Coherent and Normal Rate  Affect:  Appropriate and Congruent  Mood:  normal  Thought process:  normal  Thought content:    WNL  Sensory/Perceptual disturbances:    WNL  Orientation:  oriented to person, place, time/date, and situation  Attention:  Good  Concentration:  Good  Memory:  WNL  Fund of knowledge:   Good  Insight:    Good  Judgment:   Good  Impulse Control:  Good   Risk Assessment: Danger to Self:  No Self-injurious Behavior: No Danger to Others: No Duty to Warn:no Physical Aggression / Violence:No  Access to Firearms a concern: No  Gang Involvement:No   Subjective: Patient presented to session to address concerns of anxiety and depression.  She reported mixed progress at this time.  Patient presented to session with her husband.  Patient processed experience of her health journey at this time and worries she has about diagnostic tests.  Patient voiced ongoing concerns about marital dynamics as relate her mental and emotional wellbeing.  Counselor actively listened, affirmed patient feelings and experience, and helped to facilitate insight and offer support.  Patient and spouse discussed both feeling sense of being controlled in relationship and having difficulty with adhering to nonnegotiables as discussed in counseling.  Patient spouse and counselor discussed cycle of hurtfulness between them, and continued to discuss strategies to interrupt the  cycle with emotional regulation skills, and to continue to address points of contention including finances.  Interventions: Assertiveness/Communication, Solution-Oriented/Positive Psychology, Humanistic/Existential, Insight-Oriented, and Family Systems  Diagnosis:   ICD-10-CM   1. Generalized anxiety disorder  F41.1     2. Major depressive disorder, recurrent episode, moderate (HCC)  F33.1       Plan: Patient is scheduled for follow-up; continue process work and developing coping skills.  STG between sessions to continue to practice interpersonal effectiveness skills including conflict resolution skills, and to practice emotional regulation skill set as needed.  Progress note was dictated with Dragon and reviewed for accuracy.  Anthon Kins, [facility]

## 2023-05-28 ENCOUNTER — Encounter: Payer: Self-pay | Admitting: Physical Therapy

## 2023-05-28 ENCOUNTER — Other Ambulatory Visit: Payer: Self-pay | Admitting: Internal Medicine

## 2023-05-28 ENCOUNTER — Ambulatory Visit: Admitting: Physical Therapy

## 2023-05-28 DIAGNOSIS — G8929 Other chronic pain: Secondary | ICD-10-CM

## 2023-05-28 DIAGNOSIS — E041 Nontoxic single thyroid nodule: Secondary | ICD-10-CM

## 2023-05-28 DIAGNOSIS — Z96651 Presence of right artificial knee joint: Secondary | ICD-10-CM

## 2023-05-28 DIAGNOSIS — M25561 Pain in right knee: Secondary | ICD-10-CM

## 2023-05-28 DIAGNOSIS — M6281 Muscle weakness (generalized): Secondary | ICD-10-CM

## 2023-05-28 DIAGNOSIS — M25661 Stiffness of right knee, not elsewhere classified: Secondary | ICD-10-CM

## 2023-05-28 DIAGNOSIS — M549 Dorsalgia, unspecified: Secondary | ICD-10-CM | POA: Diagnosis not present

## 2023-05-28 NOTE — Therapy (Signed)
 OUTPATIENT PHYSICAL THERAPY LOWER EXTREMITY TREATMENT    Patient Name: [name] MRN: [id number] DOB:1960-11-19, 63 y.o., female Today's Date: 05/28/2023  END OF SESSION:  PT End of Session - 05/28/23 1451     Visit Number 85    Date for PT Re-Evaluation 07/19/23    Authorization Type [employer]    PT Start Time 1445    PT Stop Time 1530    PT Time Calculation (min) 45 min    Activity Tolerance Patient tolerated treatment well    Behavior During Therapy WFL for tasks assessed/performed               Past Medical History:  Diagnosis Date   Arthritis    Back pain of lumbar region with sciatica    Cancer (HCC)    breast   Depression    Diabetes mellitus, type II (HCC)    Family history of anesthesia complication    grandfather died under anesthesia 70's- had heart issues   H/O carpal tunnel repair 2014   Headache(784.0)    Hyperlipidemia    Hypertension    Hypothyroidism    Medial meniscus tear    PONV (postoperative nausea and vomiting)    Sleep apnea    cpap since 10   Past Surgical History:  Procedure Laterality Date   BILATERAL TOTAL MASTECTOMY WITH AXILLARY LYMPH NODE DISSECTION     BREAST RECONSTRUCTION     CERVICAL DISC ARTHROPLASTY N/A 03/19/2013   Procedure: CERVICAL FIVE TO SIX, CERVICAL SIX TO SEVEN CERVICAL ANTERIOR DISC ARTHROPLASTY;  Surgeon: Elna Haggis, MD;  Location: [facility];  Service: Neurosurgery;  Laterality: N/A;  C56 C67 artificial disc replacement   CESAREAN SECTION     COLONOSCOPY     DILATATION & CURETTAGE/HYSTEROSCOPY WITH TRUECLEAR N/A 11/25/2013   Procedure: DILATATION & CURETTAGE/HYSTEROSCOPY WITH TRUCLEAR;  Surgeon: Piedad Brewer, MD;  Location: [facility];  Service: Gynecology;  Laterality: N/A;   ECTOPIC PREGNANCY SURGERY     GALLBLADDER SURGERY     MOUTH SURGERY     child- dog bite   SPINAL FUSION     2023   STERIOD INJECTION Left 09/09/2022   Procedure: LEFT KNEE STEROID INJECTION;  Surgeon: Liliane Rei, MD;   Location: [facility];  Service: Orthopedics;  Laterality: Left;  left knee injection 0928   TONSILLECTOMY     TOTAL KNEE ARTHROPLASTY Right 09/09/2022   Procedure: TOTAL KNEE ARTHROPLASTY;  Surgeon: Liliane Rei, MD;  Location: [facility];  Service: Orthopedics;  Laterality: Right;   TUBAL LIGATION     Patient Active Problem List   Diagnosis Date Noted   OA (osteoarthritis) of knee 09/09/2022   Exertional dyspnea 04/17/2022   Elevated coronary artery calcium  score 04/17/2022   Spondylolisthesis at L4-L5 level 12/27/2021   Ductal carcinoma in situ (DCIS) of left breast 01/19/2018   Generalized anxiety disorder 11/07/2017   Depression 11/07/2017   Insomnia 11/07/2017   Cervical spondylosis 03/19/2013    PCP: Margarete Sharps  REFERRING PROVIDER: Athena Lazier  REFERRING DIAG: R TKA 09/09/22  THERAPY DIAG:  Chronic bilateral back pain, unspecified back location  Muscle weakness (generalized)  S/P total knee arthroplasty, right  Acute pain of right knee  Stiffness of right knee, not elsewhere classified  Rationale for Evaluation and Treatment: Rehabilitation  ONSET DATE: 09/09/22  SUBJECTIVE:   SUBJECTIVE STATEMENT :  Still awaiting results of both the breast and the back MRI.  She has a biopsy this week and will see back surgeon  this Friday R TKA 8/19 L knee steroid injection 09/09/22  PAIN:  Are you having pain? Yes: NPRS scale: 6/10 Pain location: right low back, buttock and reports around to the front of the thigh Pain description: sharp really hurts Aggravating factors: difficulty sleeping, difficulty walking and standing and sitting Relieving factors: heat, Aleve , tramadol  and methacarbomol  PRECAUTIONS: None  RED FLAGS: None   WEIGHT BEARING RESTRICTIONS: No  FALLS:  Has patient fallen in last 6 months? No  LIVING ENVIRONMENT: Lives with: lives with their family and lives with their spouse Lives in: House/apartment Stairs: Yes: External: 3 steps; none Has  following equipment at home: Single point cane and Walker - 2 wheeled  OCCUPATION: Not working  PLOF: Independent  PATIENT GOALS: less back pain  NEXT MD VISIT: 09/24/22  OBJECTIVE:   PATIENT SURVEYS:  FOTO 4  COGNITION: Overall cognitive status: Within functional limits for tasks assessed      EDEMA:  Increased swelling surrounding R knee  POSTURE: rounded shoulders and flexed trunk   PALPATION: Warm and TTP  LOWER EXTREMITY ROM:  Active ROM Right eval Left eval Right 09/24/22 Right 09/26/22 Right 10/02/22  11/01/22 12/09/22  Hip flexion          Hip extension          Hip abduction          Hip adduction          Hip internal rotation          Hip external rotation          Knee flexion  60  75* AROM, 86 PROM 82* AROM seated per pt request  78* self AAROM, 76* AROM  PROM 87 AROM 81 PROM 90 AROM 83 AROM 101 PROM 105  Knee extension -30  -15* AROM seated, -15 PROM supine   -16* supine heel prop with quad set  AROM 25 PROM 16 AROM 14 AROM 5 PROM 0  Ankle dorsiflexion          Ankle plantarflexion          Ankle inversion          Ankle eversion           (Blank rows = not tested)  LOWER EXTREMITY MMT:  MMT Right eval Left eval  Hip flexion 2   Hip extension    Hip abduction    Hip adduction    Hip internal rotation    Hip external rotation    Knee flexion 2   Knee extension 2   Ankle dorsiflexion    Ankle plantarflexion    Ankle inversion    Ankle eversion     (Blank rows = not tested)  LUMBAR ROM:  decreased 50% flexion, decreased 75% for extension and side bending, pain was worse with extension and right side bending. Hooklying is less painful that lying flat on back Mild tightness with pain during HS stretch, tight and painful piriformis bilaterally worse on the right , very weak core  FUNCTIONAL TESTS:  5 times sit to stand: 30 seconds 03/20/23 Timed up and go (TUG): 1 min 28s  GAIT: Is no without assistive device, mild antalgic on the right     TODAY'S TREATMENT:  DATE:  05/28/23 Bike level 4 x 6 minutes 5# straight arm pulls Feet on ball K2C, rotation, small bridge, isometric abs Passive stretch LE's Sheet traction STM to the right buttock and back   05/26/23 Bike level 5 x 6 minutes 5# straight arm pulls 20# rows 2x10 20# lats 2x10 5# AR press Passive stretch LE's STM to the right buttock, HS and low back Manual sheet traction lumbar  05/22/23 PROM LE's STM to the buttocks, HS and Lumbar area into the thoracic area Manual sheet traction  05/19/23 Bike level 4 x 6 minutes 5# straight arm pulls Seated rows 20# Lats 20# 10# AR press some pain in leg so we stopped Feet on ball K2C, rotation, bridges, isometric abs Passive stretch, STM to the lumbar area left, sheet traction   05/15/23 Bike level 5 x 5 minutes 5# straight arm pulls cues for core activation Feet on ball K2C, rotation, small bridge, isometric abs Passive LE stretches STM to the right buttock and the bilateral lumbar paraspinals Manual sheet tractoin  05/13/23 Recheck goals for progress note  Gait around parking lot  NuStep L5x11mins  5# straight arm pulls 2x10 AR press 10# 2x10 Calf stretch 30s  Supine LE stretches- LTR, HS, SKTC, figure 4    05/07/23 Bike level 5 x 6 minutes 5# straight arm pulls 5 # hip extension some pain Feet on ball K2C, rotation, small bridge, isometric abs Manual sheet traction STM with the Tgun in the right low back and buttock  05/05/23 Bike level 5 x 6 minutes Gait outside around the parking [place] 5# straight arm pulls 15# lats Feet on ball K2C, rotation, bridges, isometric abs Manual sheet traction STM to the right low back and buttock  05/01/23 Bike level 4 x  minute 5# straight arm pulls 5# AR press Hip extension no weight Hip abduction no weight Feet on ball K2C, trunk  rotation, posterior isometrics and isometric abs STM to back and hips Manual sheet traction gently  04/28/23 Bike level 4 x 5 minutes with towel roll behind back 5# straight arm pulls cues for posture and core activation Feet on ball K2C, rotation, bridge, isometric abs Passive LE stretches STM to the right low back and buttock Tgun to above Manual sheet traction  04/24/23 Gait walking around the back building, she reported no increase of LBP but was already pain at a 7/10, she reports that she did not have to push on her back with her hands Feet on ball K2C, rotation, small bridge, isometric abs Passive LE stretches Passive gentle sheet lumbar traction STM with the T-gun to the lumbar area and the glutes  04/21/23 Gait outside around the parking [employer] level 4 x 5 minutes 5# straight arm pulls mild increase of LBP Feet on ball K2C, rotation, smaal posterior activation , isometric abs Passive LE stretches STM with the T-gun to the right low back and buttock on the right Manual pelvic traction  04/17/23 Gait outside descent pace, this does increase pain Bike level 3 x 5 minutes PROM LE's STM with the T-gun to the low back and buttock Gentle manual sheet traction  04/15/23 Nustep level 5 x 6 minutes Bike level 3 x 5 minutes Leg press 20# and no weight  5# straight arm pulls STM to the right low back and buttock Gentle manual pelvic traction with sheet  04/10/23 Sit ft pelvic ROM 4 way s 15 x each Nustep L 5 Red tband seated trunk ext 2 sets  10,10 x each trunk rotation Iso abs with ball 10 x hold 3 sec Feet on ball bridge, KTC and obl ADD ball squeeze Clams and hip flex red tband 2 sets 10 Passive stretch to the LE's Very gentle manual sheet traction lumbar   04/08/23 STM with the Tgun to the right low back and right buttock Feet on ball K2C, small rotation, small bridge and isometric abs Passive stretch to the LE's Very gentle manual sheet traction lumbar   MHP/IFC to the right low back in supine feet elevated  04/03/23 Nustep L 5 6 min Sitting on dyna disc pelvic ROM 10x for pelvic ROM and stab Sitting on dyna disc for stab LAQ,hip flex,hip abd 10 x each Sitting on dyna disc shld row and ext red tband 2 sets 10 Supine LE stretches- HS, LTR, SKTC, piriformis, glutes RT LE NT stretching-positive MH/IFC RT LB supine   04/01/23 Walk outdoors  NuStep L5x27mins  Seated row 20# 2x10 Lat pull down 20# 2x10 BlackTB ext 2x10 Supine LE stretches- HS, LTR, SKTC, piriformis, glutes    03/26/23 NuStep L5 x 6 min Bridges LE on pball bridges x5, K2C x10, Oblq   Passive stretching to HS, piriformis, ITB, Glute Shoulder Ext 5lb x10 Standing rows 5lb x12  03/20/23 Evaluation of the lumbar spine order from Dr. Ellery Guthrie  PATIENT EDUCATION:  Education details: POC and HEP Person educated: Patient Education method: Explanation Education comprehension: verbalized understanding  HOME EXERCISE PROGRAM: Access Code: [facility] Access Code: [email or web address] URL: [email or web address] Date: 03/20/2023 Prepared by: Cherylene Corrente  Exercises - Hooklying Single Knee to Chest Stretch  - 2 x daily - 7 x weekly - 1 sets - 10 reps - 5 hold - Supine Double Knee to Chest  - 2 x daily - 7 x weekly - 1 sets - 10 reps - 5 hold - Supine Lower Trunk Rotation with PLB  - 2 x daily - 7 x weekly - 1 sets - 10 reps - 5 hold - Supine Posterior Pelvic Tilt  - 2 x daily - 7 x weekly - 1 sets - 10 reps - 3 hold  ASSESSMENT:  CLINICAL IMPRESSION:   Awaiting results of the lumbar MRI, she has appointment with the back surgeon on May 9th.  She also is awaiting further testing and MD visits with the breast and the thyroid .  She is starting to have some pain in the right hip and groin when getting up and down.  OBJECTIVE IMPAIRMENTS: Abnormal gait, cardiopulmonary status limiting activity, decreased activity tolerance, decreased endurance, decreased mobility,  difficulty walking, decreased ROM, decreased strength, increased muscle spasms, impaired flexibility, improper body mechanics, postural dysfunction, and pain.   REHAB POTENTIAL: Good  CLINICAL DECISION MAKING: Stable/uncomplicated  EVALUATION COMPLEXITY: Low   GOALS: Goals reviewed with patient? Yes  SHORT TERM GOALS: Target date: 10/24/22  Patient will be independent with initial HEP. Goal status: 09/20/22 met  2.  Patient will be do TUG < 30s Baseline: 1 min 28s  Goal status: ongoing 10/07/22, 18.57s MET 10/09/22  3.  Patient will be able to do 5 sit to stands  Baseline: unable to do  Goal status: progressing 10/04/22, MET 10/09/22   LONG TERM GOALS: Target date: 05/18/23  Patient will be independent with advanced/ongoing HEP to improve outcomes and carryover.  Goal status: ongoing 11/06/22  2.  Patient will report at least 75% improvement in R knee pain to improve QOL. Baseline: 10/10 Goal status: met 02/03/23 60% better 11/20/22, 65% 12/06/22, 70%  12/17/22  3.  Patient will demonstrate improved R knee AROM to >/= 0-120 deg to allow for normal gait and stair mechanics. Baseline: 30-0-60 10/09/22: 16-0-78  11/01/22: 14-0-83 11/20/22: 10-97 12/06/22: 10-0-100 12/17/22: 8-0-104 05/13/23: 5-0-115 Goal status: continue to progress and getting closer 0-112  03/13/23. Ongoing 05/13/23  4.  Patient will demonstrate improved functional LE strength as demonstrated by 5/5. Baseline: 2/5 Goal status: MET 3+/5 10/09/22, 5/5 11/01/22   5.  Patient will be able to ambulate 500' with LRAD and normal gait pattern without increased pain to access community.  Baseline: using RW small in home ambulation distances Goal status: progressing w/SPC 12/02/22, doing some walking without AD, MET 05/13/23  6.  Patient will report 20 on FOTO (patient reported outcome measure) to demonstrate improved functional ability. Baseline: 4, 40-11/11/24 Goal status: MET 53 12/17/22  7.  Decrease lumbar pain  50%  Goal status: ongoing 05/01/23, 45% 05/13/23  8.  Increase lumbar ROM 25%  Goal status: ongoing 04/24/23, ongoing 05/13/23-- extension, L side bending, and R rotation is very limited and painful   9.  Report able to put shoes and socks on without pain > 4/10  Goal status: ongoing still pain 7/10 05/01/23, MET 05/13/23  PLAN:  PT FREQUENCY: 2x/week  PT DURATION: 12 weeks  PLANNED INTERVENTIONS: Therapeutic exercises, Therapeutic activity, Neuromuscular re-education, Balance training, Gait training, Patient/Family education, Self Care, Joint mobilization, Stair training, Electrical stimulation, Cryotherapy, Moist heat, scar mobilization, Vasopneumatic device, and Manual therapy  PLAN FOR NEXT SESSION: continue conservative until MRI   Patient Details  Name: MATISON STONEBACK MRN: [id number] Date of Birth: Jul 09, 1960 Referring Provider:  Margarete Sharps, MD  Encounter Date: 05/28/2023   Hollis Lurie, PT 05/28/2023, 2:51 PM

## 2023-05-29 ENCOUNTER — Other Ambulatory Visit (HOSPITAL_COMMUNITY)
Admission: RE | Admit: 2023-05-29 | Discharge: 2023-05-29 | Disposition: A | Discharge status: Home / Self Care | Source: Ambulatory Visit | Attending: Internal Medicine | Admitting: Internal Medicine

## 2023-05-29 ENCOUNTER — Ambulatory Visit
Admission: RE | Admit: 2023-05-29 | Discharge: 2023-05-29 | Disposition: A | Discharge status: Home / Self Care | Source: Ambulatory Visit | Attending: Internal Medicine | Admitting: Internal Medicine

## 2023-05-29 ENCOUNTER — Encounter: Admitting: Physical Therapy

## 2023-05-29 DIAGNOSIS — E041 Nontoxic single thyroid nodule: Secondary | ICD-10-CM | POA: Diagnosis present

## 2023-06-02 ENCOUNTER — Encounter: Payer: Self-pay | Admitting: Professional Counselor

## 2023-06-02 ENCOUNTER — Encounter: Payer: Self-pay | Admitting: Physical Therapy

## 2023-06-02 ENCOUNTER — Ambulatory Visit: Admitting: Physical Therapy

## 2023-06-02 ENCOUNTER — Ambulatory Visit (INDEPENDENT_AMBULATORY_CARE_PROVIDER_SITE_OTHER): Payer: PRIVATE HEALTH INSURANCE | Admitting: Professional Counselor

## 2023-06-02 DIAGNOSIS — Z96651 Presence of right artificial knee joint: Secondary | ICD-10-CM

## 2023-06-02 DIAGNOSIS — M549 Dorsalgia, unspecified: Secondary | ICD-10-CM | POA: Diagnosis not present

## 2023-06-02 DIAGNOSIS — F331 Major depressive disorder, recurrent, moderate: Secondary | ICD-10-CM

## 2023-06-02 DIAGNOSIS — G8929 Other chronic pain: Secondary | ICD-10-CM

## 2023-06-02 DIAGNOSIS — M25661 Stiffness of right knee, not elsewhere classified: Secondary | ICD-10-CM

## 2023-06-02 DIAGNOSIS — F411 Generalized anxiety disorder: Secondary | ICD-10-CM

## 2023-06-02 DIAGNOSIS — M25561 Pain in right knee: Secondary | ICD-10-CM

## 2023-06-02 DIAGNOSIS — M6281 Muscle weakness (generalized): Secondary | ICD-10-CM

## 2023-06-02 LAB — CYTOLOGY - NON PAP

## 2023-06-02 NOTE — Therapy (Signed)
 OUTPATIENT PHYSICAL THERAPY LOWER EXTREMITY TREATMENT    Patient Name: [name] MRN: [id number] DOB:1960/09/22, 63 y.o., female Today's Date: 06/02/2023  END OF SESSION:  PT End of Session - 06/02/23 1755     Visit Number 86    Date for PT Re-Evaluation 07/19/23    Authorization Type [employer]    PT Start Time 1740    PT Stop Time 1833    PT Time Calculation (min) 53 min    Activity Tolerance Patient tolerated treatment well    Behavior During Therapy WFL for tasks assessed/performed               Past Medical History:  Diagnosis Date   Arthritis    Back pain of lumbar region with sciatica    Cancer (HCC)    breast   Depression    Diabetes mellitus, type II (HCC)    Family history of anesthesia complication    grandfather died under anesthesia 70's- had heart issues   H/O carpal tunnel repair 2014   Headache(784.0)    Hyperlipidemia    Hypertension    Hypothyroidism    Medial meniscus tear    PONV (postoperative nausea and vomiting)    Sleep apnea    cpap since 10   Past Surgical History:  Procedure Laterality Date   BILATERAL TOTAL MASTECTOMY WITH AXILLARY LYMPH NODE DISSECTION     BREAST RECONSTRUCTION     CERVICAL DISC ARTHROPLASTY N/A 03/19/2013   Procedure: CERVICAL FIVE TO SIX, CERVICAL SIX TO SEVEN CERVICAL ANTERIOR DISC ARTHROPLASTY;  Surgeon: Elna Haggis, MD;  Location: [facility];  Service: Neurosurgery;  Laterality: N/A;  C56 C67 artificial disc replacement   CESAREAN SECTION     COLONOSCOPY     DILATATION & CURETTAGE/HYSTEROSCOPY WITH TRUECLEAR N/A 11/25/2013   Procedure: DILATATION & CURETTAGE/HYSTEROSCOPY WITH TRUCLEAR;  Surgeon: Piedad Brewer, MD;  Location: [facility];  Service: Gynecology;  Laterality: N/A;   ECTOPIC PREGNANCY SURGERY     GALLBLADDER SURGERY     MOUTH SURGERY     child- dog bite   SPINAL FUSION     2023   STERIOD INJECTION Left 09/09/2022   Procedure: LEFT KNEE STEROID INJECTION;  Surgeon: Liliane Rei, MD;   Location: [facility];  Service: Orthopedics;  Laterality: Left;  left knee injection 0928   TONSILLECTOMY     TOTAL KNEE ARTHROPLASTY Right 09/09/2022   Procedure: TOTAL KNEE ARTHROPLASTY;  Surgeon: Liliane Rei, MD;  Location: [facility];  Service: Orthopedics;  Laterality: Right;   TUBAL LIGATION     Patient Active Problem List   Diagnosis Date Noted   OA (osteoarthritis) of knee 09/09/2022   Exertional dyspnea 04/17/2022   Elevated coronary artery calcium  score 04/17/2022   Spondylolisthesis at L4-L5 level 12/27/2021   Ductal carcinoma in situ (DCIS) of left breast 01/19/2018   Generalized anxiety disorder 11/07/2017   Depression 11/07/2017   Insomnia 11/07/2017   Cervical spondylosis 03/19/2013    PCP: Margarete Sharps  REFERRING PROVIDER: Athena Lazier  REFERRING DIAG: R TKA 09/09/22  THERAPY DIAG:  Chronic bilateral back pain, unspecified back location  Muscle weakness (generalized)  S/P total knee arthroplasty, right  Acute pain of right knee  Stiffness of right knee, not elsewhere classified  Rationale for Evaluation and Treatment: Rehabilitation  ONSET DATE: 09/09/22  SUBJECTIVE:   SUBJECTIVE STATEMENT :  Saw Dr. Ellery Guthrie last Friday, has a bulging disc on the right at L2, recommended continuation of PT. R TKA 8/19 L knee  steroid injection 09/09/22  PAIN:  Are you having pain? Yes: NPRS scale: 6/10 Pain location: right low back, buttock and reports around to the front of the thigh Pain description: sharp really hurts Aggravating factors: difficulty sleeping, difficulty walking and standing and sitting Relieving factors: heat, Aleve , tramadol  and methacarbomol  PRECAUTIONS: None  RED FLAGS: None   WEIGHT BEARING RESTRICTIONS: No  FALLS:  Has patient fallen in last 6 months? No  LIVING ENVIRONMENT: Lives with: lives with their family and lives with their spouse Lives in: House/apartment Stairs: Yes: External: 3 steps; none Has following equipment at home:  Single point cane and Walker - 2 wheeled  OCCUPATION: Not working  PLOF: Independent  PATIENT GOALS: less back pain  NEXT MD VISIT: 09/24/22  OBJECTIVE:   PATIENT SURVEYS:  FOTO 4  COGNITION: Overall cognitive status: Within functional limits for tasks assessed      EDEMA:  Increased swelling surrounding R knee  POSTURE: rounded shoulders and flexed trunk   PALPATION: Warm and TTP  LOWER EXTREMITY ROM:  Active ROM Right eval Left eval Right 09/24/22 Right 09/26/22 Right 10/02/22  11/01/22 12/09/22  Hip flexion          Hip extension          Hip abduction          Hip adduction          Hip internal rotation          Hip external rotation          Knee flexion  60  75* AROM, 86 PROM 82* AROM seated per pt request  78* self AAROM, 76* AROM  PROM 87 AROM 81 PROM 90 AROM 83 AROM 101 PROM 105  Knee extension -30  -15* AROM seated, -15 PROM supine   -16* supine heel prop with quad set  AROM 25 PROM 16 AROM 14 AROM 5 PROM 0  Ankle dorsiflexion          Ankle plantarflexion          Ankle inversion          Ankle eversion           (Blank rows = not tested)  LOWER EXTREMITY MMT:  MMT Right eval Left eval  Hip flexion 2   Hip extension    Hip abduction    Hip adduction    Hip internal rotation    Hip external rotation    Knee flexion 2   Knee extension 2   Ankle dorsiflexion    Ankle plantarflexion    Ankle inversion    Ankle eversion     (Blank rows = not tested)  LUMBAR ROM:  decreased 50% flexion, decreased 75% for extension and side bending, pain was worse with extension and right side bending. Hooklying is less painful that lying flat on back Mild tightness with pain during HS stretch, tight and painful piriformis bilaterally worse on the right , very weak core  FUNCTIONAL TESTS:  5 times sit to stand: 30 seconds 03/20/23 Timed up and go (TUG): 1 min 28s  GAIT: Is no without assistive device, mild antalgic on the right    TODAY'S TREATMENT:  DATE:  06/02/23 Bike level 4 x 6 minutes Prone over two pillows x 2 minutes Then in road kill position with right leg ER and out for 2 minutes Passive stretch LE's STM to the right low back and right buttock Manual sheet traction  05/28/23 Bike level 4 x 6 minutes 5# straight arm pulls Feet on ball K2C, rotation, small bridge, isometric abs Passive stretch LE's Sheet traction STM to the right buttock and back  05/26/23 Bike level 5 x 6 minutes 5# straight arm pulls 20# rows 2x10 20# lats 2x10 5# AR press Passive stretch LE's STM to the right buttock, HS and low back Manual sheet traction lumbar  05/22/23 PROM LE's STM to the buttocks, HS and Lumbar area into the thoracic area Manual sheet traction  05/19/23 Bike level 4 x 6 minutes 5# straight arm pulls Seated rows 20# Lats 20# 10# AR press some pain in leg so we stopped Feet on ball K2C, rotation, bridges, isometric abs Passive stretch, STM to the lumbar area left, sheet traction   05/15/23 Bike level 5 x 5 minutes 5# straight arm pulls cues for core activation Feet on ball K2C, rotation, small bridge, isometric abs Passive LE stretches STM to the right buttock and the bilateral lumbar paraspinals Manual sheet tractoin  05/13/23 Recheck goals for progress note  Gait around parking lot  NuStep L5x6mins  5# straight arm pulls 2x10 AR press 10# 2x10 Calf stretch 30s  Supine LE stretches- LTR, HS, SKTC, figure 4    05/07/23 Bike level 5 x 6 minutes 5# straight arm pulls 5 # hip extension some pain Feet on ball K2C, rotation, small bridge, isometric abs Manual sheet traction STM with the Tgun in the right low back and buttock  05/05/23 Bike level 5 x 6 minutes Gait outside around the parking [place] 5# straight arm pulls 15# lats Feet on ball K2C, rotation, bridges, isometric abs Manual sheet  traction STM to the right low back and buttock  05/01/23 Bike level 4 x  minute 5# straight arm pulls 5# AR press Hip extension no weight Hip abduction no weight Feet on ball K2C, trunk rotation, posterior isometrics and isometric abs STM to back and hips Manual sheet traction gently  04/28/23 Bike level 4 x 5 minutes with towel roll behind back 5# straight arm pulls cues for posture and core activation Feet on ball K2C, rotation, bridge, isometric abs Passive LE stretches STM to the right low back and buttock Tgun to above Manual sheet traction  04/24/23 Gait walking around the back building, she reported no increase of LBP but was already pain at a 7/10, she reports that she did not have to push on her back with her hands Feet on ball K2C, rotation, small bridge, isometric abs Passive LE stretches Passive gentle sheet lumbar traction STM with the T-gun to the lumbar area and the glutes  04/21/23 Gait outside around the parking [employer] level 4 x 5 minutes 5# straight arm pulls mild increase of LBP Feet on ball K2C, rotation, smaal posterior activation , isometric abs Passive LE stretches STM with the T-gun to the right low back and buttock on the right Manual pelvic traction  04/17/23 Gait outside descent pace, this does increase pain Bike level 3 x 5 minutes PROM LE's STM with the T-gun to the low back and buttock Gentle manual sheet traction  04/15/23 Nustep level 5 x 6 minutes Bike level 3 x 5 minutes Leg press 20# and no  weight  5# straight arm pulls STM to the right low back and buttock Gentle manual pelvic traction with sheet  04/10/23 Sit ft pelvic ROM 4 way s 15 x each Nustep L 5 Red tband seated trunk ext 2 sets 10,10 x each trunk rotation Iso abs with ball 10 x hold 3 sec Feet on ball bridge, KTC and obl ADD ball squeeze Clams and hip flex red tband 2 sets 10 Passive stretch to the LE's Very gentle manual sheet traction lumbar   04/08/23 STM  with the Tgun to the right low back and right buttock Feet on ball K2C, small rotation, small bridge and isometric abs Passive stretch to the LE's Very gentle manual sheet traction lumbar  MHP/IFC to the right low back in supine feet elevated  04/03/23 Nustep L 5 6 min Sitting on dyna disc pelvic ROM 10x for pelvic ROM and stab Sitting on dyna disc for stab LAQ,hip flex,hip abd 10 x each Sitting on dyna disc shld row and ext red tband 2 sets 10 Supine LE stretches- HS, LTR, SKTC, piriformis, glutes RT LE NT stretching-positive MH/IFC RT LB supine   04/01/23 Walk outdoors  NuStep L5x29mins  Seated row 20# 2x10 Lat pull down 20# 2x10 BlackTB ext 2x10 Supine LE stretches- HS, LTR, SKTC, piriformis, glutes    03/26/23 NuStep L5 x 6 min Bridges LE on pball bridges x5, K2C x10, Oblq   Passive stretching to HS, piriformis, ITB, Glute Shoulder Ext 5lb x10 Standing rows 5lb x12  03/20/23 Evaluation of the lumbar spine order from Dr. Ellery Guthrie  PATIENT EDUCATION:  Education details: POC and HEP Person educated: Patient Education method: Explanation Education comprehension: verbalized understanding  HOME EXERCISE PROGRAM: Access Code: [facility] Access Code: [email or web address] URL: [email or web address] Date: 03/20/2023 Prepared by: Cherylene Corrente  Exercises - Hooklying Single Knee to Chest Stretch  - 2 x daily - 7 x weekly - 1 sets - 10 reps - 5 hold - Supine Double Knee to Chest  - 2 x daily - 7 x weekly - 1 sets - 10 reps - 5 hold - Supine Lower Trunk Rotation with PLB  - 2 x daily - 7 x weekly - 1 sets - 10 reps - 5 hold - Supine Posterior Pelvic Tilt  - 2 x daily - 7 x weekly - 1 sets - 10 reps - 3 hold  ASSESSMENT:  CLINICAL IMPRESSION:   Her Thyroid  testing appears to be benign, her MRI for the back results went to MD and she saw the back surgeon Friday, there is a right disc herniation at L2-3.  I initiated prone over 2 pillows for 2 minutes and then roadkill  position over 2 pillows for 2 minutes.  She tolerated this well without any extra pain, she had a little pinch when she got up but it went away.  I would like to ease into this, she does report that she will be scheduled in the future for an injection  OBJECTIVE IMPAIRMENTS: Abnormal gait, cardiopulmonary status limiting activity, decreased activity tolerance, decreased endurance, decreased mobility, difficulty walking, decreased ROM, decreased strength, increased muscle spasms, impaired flexibility, improper body mechanics, postural dysfunction, and pain.   REHAB POTENTIAL: Good  CLINICAL DECISION MAKING: Stable/uncomplicated  EVALUATION COMPLEXITY: Low   GOALS: Goals reviewed with patient? Yes  SHORT TERM GOALS: Target date: 10/24/22  Patient will be independent with initial HEP. Goal status: 09/20/22 met  2.  Patient will be do TUG < 30s Baseline: 1 min 28s  Goal status: ongoing 10/07/22, 18.57s MET 10/09/22  3.  Patient will be able to do 5 sit to stands  Baseline: unable to do  Goal status: progressing 10/04/22, MET 10/09/22   LONG TERM GOALS: Target date: 05/18/23  Patient will be independent with advanced/ongoing HEP to improve outcomes and carryover.  Goal status: ongoing 11/06/22  2.  Patient will report at least 75% improvement in R knee pain to improve QOL. Baseline: 10/10 Goal status: met 02/03/23 60% better 11/20/22, 65% 12/06/22, 70% 12/17/22  3.  Patient will demonstrate improved R knee AROM to >/= 0-120 deg to allow for normal gait and stair mechanics. Baseline: 30-0-60 10/09/22: 16-0-78  11/01/22: 14-0-83 11/20/22: 10-97 12/06/22: 10-0-100 12/17/22: 8-0-104 05/13/23: 5-0-115 Goal status: continue to progress and getting closer 0-112  03/13/23. Ongoing 05/13/23  4.  Patient will demonstrate improved functional LE strength as demonstrated by 5/5. Baseline: 2/5 Goal status: MET 3+/5 10/09/22, 5/5 11/01/22   5.  Patient will be able to ambulate 500' with LRAD and  normal gait pattern without increased pain to access community.  Baseline: using RW small in home ambulation distances Goal status: progressing w/SPC 12/02/22, doing some walking without AD, MET 05/13/23  6.  Patient will report 19 on FOTO (patient reported outcome measure) to demonstrate improved functional ability. Baseline: 4, 40-11/11/24 Goal status: MET 53 12/17/22  7.  Decrease lumbar pain 50%  Goal status: ongoing 05/01/23, 45% 05/13/23  8.  Increase lumbar ROM 25%  Goal status: ongoing 04/24/23, ongoing 05/13/23-- extension, L side bending, and R rotation is very limited and painful   9.  Report able to put shoes and socks on without pain > 4/10  Goal status: ongoing still pain 7/10 05/01/23, MET 05/13/23  PLAN:  PT FREQUENCY: 2x/week  PT DURATION: 12 weeks  PLANNED INTERVENTIONS: Therapeutic exercises, Therapeutic activity, Neuromuscular re-education, Balance training, Gait training, Patient/Family education, Self Care, Joint mobilization, Stair training, Electrical stimulation, Cryotherapy, Moist heat, scar mobilization, Vasopneumatic device, and Manual therapy  PLAN FOR NEXT SESSION: may add prone over pillows, working to prone and road kill positions   Patient Details  Name: [name] MRN: [id number] Date of Birth: 08/29/1960 Referring Provider:  Elna Haggis, MD  Encounter Date: 06/02/2023   Hollis Lurie, PT 06/02/2023, 5:58 PM

## 2023-06-02 NOTE — Progress Notes (Signed)
      [facility] Counselor/Therapist Progress Note  Patient ID: [name], MRN: [id number],    Date: 06/02/2023  Time Spent: 4:14 PM to 5:18 PM  Treatment Type: Family with patient  Patient present with spouse for session.  Reported Symptoms: Worries, stress, interpersonal concerns, health concerns, emotional dysregulation, low mood, restlessness, nervousness, trouble relaxing, fatigue, self-esteem concerns  Mental Status Exam:  Appearance:   Neat     Behavior:  Appropriate, Sharing, and Motivated  Motor:  Normal  Speech/Language:   Clear and Coherent and Normal Rate  Affect:  Appropriate and Congruent  Mood:  normal  Thought process:  normal  Thought content:    WNL  Sensory/Perceptual disturbances:    WNL  Orientation:  oriented to person, place, time/date, and situation  Attention:  Good  Concentration:  Good  Memory:  WNL  Fund of knowledge:   Good  Insight:    Good  Judgment:   Good  Impulse Control:  Good   Risk Assessment: Danger to Self:  No Self-injurious Behavior: No Danger to Others: No Duty to Warn:no Physical Aggression / Violence:No  Access to Firearms a concern: No  Gang Involvement:No   Subjective: Patient presented to session to address concerns of anxiety and depression.  She reported mixed progress at this time.  Patient reported continued depressive and anxious symptomology however slightly improved from recent sessions.  She voiced significant health concerns and worry about them including possible breast reconstruction due to inconclusive issue, however for diagnostics to have not found cancer for which she is glad.  She voiced thyroid  biopsy to be pending.  She reported an herniated disc diagnosis.  Patient processed experience of stress around insurance and husband's last week of work, and what that may mean for finances and retirement.  Counselor actively listened and affirmed patient feelings and experience, and helped to instill hope and  resource patient strengths including resiliency.  Patient expressed continued difficulty in communications and emotional dysregulation with husband and conflict that ensues between them; counselor assisted in resourcing emotional regulation and conflict resolution skills including strategies of slowing down, reciprocal validation and other strategies.  Interventions: Solution-Oriented/Positive Psychology, Humanistic/Existential, Insight-Oriented, and Interpersonal  Diagnosis:   ICD-10-CM   1. Generalized anxiety disorder  F41.1     2. Major depressive disorder, recurrent episode, moderate (HCC)  F33.1       Plan: Patient is scheduled for follow-up; continue process work and developing coping skills.  STG between sessions to prioritize health and medical needs, rest, relaxation and self-care objectives, and continue to practice emotional regulation and conflict resolution skills with spouse.  Progress note was dictated with Dragon and reviewed for accuracy.  [name], [facility]

## 2023-06-04 ENCOUNTER — Ambulatory Visit: Admitting: Physical Therapy

## 2023-06-04 ENCOUNTER — Encounter: Payer: Self-pay | Admitting: Physical Therapy

## 2023-06-04 DIAGNOSIS — M549 Dorsalgia, unspecified: Secondary | ICD-10-CM | POA: Diagnosis not present

## 2023-06-04 DIAGNOSIS — Z96651 Presence of right artificial knee joint: Secondary | ICD-10-CM

## 2023-06-04 DIAGNOSIS — G8929 Other chronic pain: Secondary | ICD-10-CM

## 2023-06-04 DIAGNOSIS — M25661 Stiffness of right knee, not elsewhere classified: Secondary | ICD-10-CM

## 2023-06-04 NOTE — Therapy (Signed)
 OUTPATIENT PHYSICAL THERAPY LOWER EXTREMITY TREATMENT    Patient Name: [name] MRN: [id number] DOB:1960/07/23, 63 y.o., female Today's Date: 06/04/2023  END OF SESSION:  PT End of Session - 06/04/23 0930     Visit Number 87    Date for PT Re-Evaluation 07/19/23    Authorization Type [employer]    PT Start Time 0927    PT Stop Time 1014    PT Time Calculation (min) 47 min    Activity Tolerance Patient tolerated treatment well    Behavior During Therapy WFL for tasks assessed/performed               Past Medical History:  Diagnosis Date   Arthritis    Back pain of lumbar region with sciatica    Cancer (HCC)    breast   Depression    Diabetes mellitus, type II (HCC)    Family history of anesthesia complication    grandfather died under anesthesia 70's- had heart issues   H/O carpal tunnel repair 2014   Headache(784.0)    Hyperlipidemia    Hypertension    Hypothyroidism    Medial meniscus tear    PONV (postoperative nausea and vomiting)    Sleep apnea    cpap since 10   Past Surgical History:  Procedure Laterality Date   BILATERAL TOTAL MASTECTOMY WITH AXILLARY LYMPH NODE DISSECTION     BREAST RECONSTRUCTION     CERVICAL DISC ARTHROPLASTY N/A 03/19/2013   Procedure: CERVICAL FIVE TO SIX, CERVICAL SIX TO SEVEN CERVICAL ANTERIOR DISC ARTHROPLASTY;  Surgeon: Elna Haggis, MD;  Location: [facility];  Service: Neurosurgery;  Laterality: N/A;  C56 C67 artificial disc replacement   CESAREAN SECTION     COLONOSCOPY     DILATATION & CURETTAGE/HYSTEROSCOPY WITH TRUECLEAR N/A 11/25/2013   Procedure: DILATATION & CURETTAGE/HYSTEROSCOPY WITH TRUCLEAR;  Surgeon: Piedad Brewer, MD;  Location: [facility];  Service: Gynecology;  Laterality: N/A;   ECTOPIC PREGNANCY SURGERY     GALLBLADDER SURGERY     MOUTH SURGERY     child- dog bite   SPINAL FUSION     2023   STERIOD INJECTION Left 09/09/2022   Procedure: LEFT KNEE STEROID INJECTION;  Surgeon: Liliane Rei, MD;   Location: [facility];  Service: Orthopedics;  Laterality: Left;  left knee injection 0928   TONSILLECTOMY     TOTAL KNEE ARTHROPLASTY Right 09/09/2022   Procedure: TOTAL KNEE ARTHROPLASTY;  Surgeon: Liliane Rei, MD;  Location: [facility];  Service: Orthopedics;  Laterality: Right;   TUBAL LIGATION     Patient Active Problem List   Diagnosis Date Noted   OA (osteoarthritis) of knee 09/09/2022   Exertional dyspnea 04/17/2022   Elevated coronary artery calcium  score 04/17/2022   Spondylolisthesis at L4-L5 level 12/27/2021   Ductal carcinoma in situ (DCIS) of left breast 01/19/2018   Generalized anxiety disorder 11/07/2017   Depression 11/07/2017   Insomnia 11/07/2017   Cervical spondylosis 03/19/2013    PCP: Margarete Sharps  REFERRING PROVIDER: Athena Lazier  REFERRING DIAG: R TKA 09/09/22  THERAPY DIAG:  Chronic bilateral back pain, unspecified back location  S/P total knee arthroplasty, right  Stiffness of right knee, not elsewhere classified  Rationale for Evaluation and Treatment: Rehabilitation  ONSET DATE: 09/09/22  SUBJECTIVE:   SUBJECTIVE STATEMENT :  Saw Dr. Ellery Guthrie last Friday, has a bulging disc on the right at L2, recommended continuation of PT.  Will have injection in back May 20th R TKA 8/19 L knee steroid injection  09/09/22  PAIN:  Are you having pain? Yes: NPRS scale: 6/10 Pain location: right low back, buttock and reports around to the front of the thigh Pain description: sharp really hurts Aggravating factors: difficulty sleeping, difficulty walking and standing and sitting Relieving factors: heat, Aleve , tramadol  and methacarbomol  PRECAUTIONS: None  RED FLAGS: None   WEIGHT BEARING RESTRICTIONS: No  FALLS:  Has patient fallen in last 6 months? No  LIVING ENVIRONMENT: Lives with: lives with their family and lives with their spouse Lives in: House/apartment Stairs: Yes: External: 3 steps; none Has following equipment at home: Single point cane and  Walker - 2 wheeled  OCCUPATION: Not working  PLOF: Independent  PATIENT GOALS: less back pain  NEXT MD VISIT: 09/24/22  OBJECTIVE:   PATIENT SURVEYS:  FOTO 4  COGNITION: Overall cognitive status: Within functional limits for tasks assessed      EDEMA:  Increased swelling surrounding R knee  POSTURE: rounded shoulders and flexed trunk   PALPATION: Warm and TTP  LOWER EXTREMITY ROM:  Active ROM Right eval Left eval Right 09/24/22 Right 09/26/22 Right 10/02/22  11/01/22 12/09/22  Hip flexion          Hip extension          Hip abduction          Hip adduction          Hip internal rotation          Hip external rotation          Knee flexion  60  75* AROM, 86 PROM 82* AROM seated per pt request  78* self AAROM, 76* AROM  PROM 87 AROM 81 PROM 90 AROM 83 AROM 101 PROM 105  Knee extension -30  -15* AROM seated, -15 PROM supine   -16* supine heel prop with quad set  AROM 25 PROM 16 AROM 14 AROM 5 PROM 0  Ankle dorsiflexion          Ankle plantarflexion          Ankle inversion          Ankle eversion           (Blank rows = not tested)  LOWER EXTREMITY MMT:  MMT Right eval Left eval  Hip flexion 2   Hip extension    Hip abduction    Hip adduction    Hip internal rotation    Hip external rotation    Knee flexion 2   Knee extension 2   Ankle dorsiflexion    Ankle plantarflexion    Ankle inversion    Ankle eversion     (Blank rows = not tested)  LUMBAR ROM:  decreased 50% flexion, decreased 75% for extension and side bending, pain was worse with extension and right side bending. Hooklying is less painful that lying flat on back Mild tightness with pain during HS stretch, tight and painful piriformis bilaterally worse on the right , very weak core  FUNCTIONAL TESTS:  5 times sit to stand: 30 seconds 03/20/23 Timed up and go (TUG): 1 min 28s  GAIT: Is no without assistive device, mild antalgic on the right    TODAY'S TREATMENT:  DATE:  06/04/23 Bike Level 5 x 6 minutes Walk around the parking [place] on airex straight arm pulls 5# 15# seated rows Feet on ball K2C, rotation, small bridge, isometric abs Passive stretch LE's Manual sheet traction STM with the T-gun right low back and right buttock  06/02/23 Bike level 4 x 6 minutes Prone over two pillows x 2 minutes Then in road kill position with right leg ER and out for 2 minutes Passive stretch LE's STM to the right low back and right buttock Manual sheet traction  05/28/23 Bike level 4 x 6 minutes 5# straight arm pulls Feet on ball K2C, rotation, small bridge, isometric abs Passive stretch LE's Sheet traction STM to the right buttock and back  05/26/23 Bike level 5 x 6 minutes 5# straight arm pulls 20# rows 2x10 20# lats 2x10 5# AR press Passive stretch LE's STM to the right buttock, HS and low back Manual sheet traction lumbar  05/22/23 PROM LE's STM to the buttocks, HS and Lumbar area into the thoracic area Manual sheet traction  05/19/23 Bike level 4 x 6 minutes 5# straight arm pulls Seated rows 20# Lats 20# 10# AR press some pain in leg so we stopped Feet on ball K2C, rotation, bridges, isometric abs Passive stretch, STM to the lumbar area left, sheet traction   05/15/23 Bike level 5 x 5 minutes 5# straight arm pulls cues for core activation Feet on ball K2C, rotation, small bridge, isometric abs Passive LE stretches STM to the right buttock and the bilateral lumbar paraspinals Manual sheet tractoin  05/13/23 Recheck goals for progress note  Gait around parking lot  NuStep L5x98mins  5# straight arm pulls 2x10 AR press 10# 2x10 Calf stretch 30s  Supine LE stretches- LTR, HS, SKTC, figure 4    05/07/23 Bike level 5 x 6 minutes 5# straight arm pulls 5 # hip extension some pain Feet on ball K2C, rotation, small bridge, isometric  abs Manual sheet traction STM with the Tgun in the right low back and buttock  05/05/23 Bike level 5 x 6 minutes Gait outside around the parking [place] 5# straight arm pulls 15# lats Feet on ball K2C, rotation, bridges, isometric abs Manual sheet traction STM to the right low back and buttock  05/01/23 Bike level 4 x  minute 5# straight arm pulls 5# AR press Hip extension no weight Hip abduction no weight Feet on ball K2C, trunk rotation, posterior isometrics and isometric abs STM to back and hips Manual sheet traction gently  04/28/23 Bike level 4 x 5 minutes with towel roll behind back 5# straight arm pulls cues for posture and core activation Feet on ball K2C, rotation, bridge, isometric abs Passive LE stretches STM to the right low back and buttock Tgun to above Manual sheet traction  04/24/23 Gait walking around the back building, she reported no increase of LBP but was already pain at a 7/10, she reports that she did not have to push on her back with her hands Feet on ball K2C, rotation, small bridge, isometric abs Passive LE stretches Passive gentle sheet lumbar traction STM with the T-gun to the lumbar area and the glutes  04/21/23 Gait outside around the parking [employer] level 4 x 5 minutes 5# straight arm pulls mild increase of LBP Feet on ball K2C, rotation, smaal posterior activation , isometric abs Passive LE stretches STM with the T-gun to the right low back and buttock on the right Manual pelvic traction  04/17/23 Gait outside  descent pace, this does increase pain Bike level 3 x 5 minutes PROM LE's STM with the T-gun to the low back and buttock Gentle manual sheet traction  04/15/23 Nustep level 5 x 6 minutes Bike level 3 x 5 minutes Leg press 20# and no weight  5# straight arm pulls STM to the right low back and buttock Gentle manual pelvic traction with sheet  04/10/23 Sit ft pelvic ROM 4 way s 15 x each Nustep L 5 Red tband seated  trunk ext 2 sets 10,10 x each trunk rotation Iso abs with ball 10 x hold 3 sec Feet on ball bridge, KTC and obl ADD ball squeeze Clams and hip flex red tband 2 sets 10 Passive stretch to the LE's Very gentle manual sheet traction lumbar   04/08/23 STM with the Tgun to the right low back and right buttock Feet on ball K2C, small rotation, small bridge and isometric abs Passive stretch to the LE's Very gentle manual sheet traction lumbar  MHP/IFC to the right low back in supine feet elevated  04/03/23 Nustep L 5 6 min Sitting on dyna disc pelvic ROM 10x for pelvic ROM and stab Sitting on dyna disc for stab LAQ,hip flex,hip abd 10 x each Sitting on dyna disc shld row and ext red tband 2 sets 10 Supine LE stretches- HS, LTR, SKTC, piriformis, glutes RT LE NT stretching-positive MH/IFC RT LB supine   04/01/23 Walk outdoors  NuStep L5x36mins  Seated row 20# 2x10 Lat pull down 20# 2x10 BlackTB ext 2x10 Supine LE stretches- HS, LTR, SKTC, piriformis, glutes    03/26/23 NuStep L5 x 6 min Bridges LE on pball bridges x5, K2C x10, Oblq   Passive stretching to HS, piriformis, ITB, Glute Shoulder Ext 5lb x10 Standing rows 5lb x12  03/20/23 Evaluation of the lumbar spine order from Dr. Ellery Guthrie  PATIENT EDUCATION:  Education details: POC and HEP Person educated: Patient Education method: Explanation Education comprehension: verbalized understanding  HOME EXERCISE PROGRAM: Access Code: [facility] Access Code: [email or web address] URL: [email or web address] Date: 03/20/2023 Prepared by: Cherylene Corrente  Exercises - Hooklying Single Knee to Chest Stretch  - 2 x daily - 7 x weekly - 1 sets - 10 reps - 5 hold - Supine Double Knee to Chest  - 2 x daily - 7 x weekly - 1 sets - 10 reps - 5 hold - Supine Lower Trunk Rotation with PLB  - 2 x daily - 7 x weekly - 1 sets - 10 reps - 5 hold - Supine Posterior Pelvic Tilt  - 2 x daily - 7 x weekly - 1 sets - 10 reps - 3  hold  ASSESSMENT:  CLINICAL IMPRESSION:   there is a right disc herniation at L2-3.  I initiated prone over 2 pillows for 2 minutes and then roadkill position over 2 pillows for 2 minutes at the last treatment, she reported no increase of pain, she is going on a trip today so advised to hold this today and resume when she returns.   I would like to ease into this, she does report that she will be scheduled in the future for an injection  OBJECTIVE IMPAIRMENTS: Abnormal gait, cardiopulmonary status limiting activity, decreased activity tolerance, decreased endurance, decreased mobility, difficulty walking, decreased ROM, decreased strength, increased muscle spasms, impaired flexibility, improper body mechanics, postural dysfunction, and pain.   REHAB POTENTIAL: Good  CLINICAL DECISION MAKING: Stable/uncomplicated  EVALUATION COMPLEXITY: Low   GOALS: Goals reviewed with patient? Yes  SHORT TERM  GOALS: Target date: 10/24/22  Patient will be independent with initial HEP. Goal status: 09/20/22 met  2.  Patient will be do TUG < 30s Baseline: 1 min 28s  Goal status: ongoing 10/07/22, 18.57s MET 10/09/22  3.  Patient will be able to do 5 sit to stands  Baseline: unable to do  Goal status: progressing 10/04/22, MET 10/09/22   LONG TERM GOALS: Target date: 05/18/23  Patient will be independent with advanced/ongoing HEP to improve outcomes and carryover.  Goal status: ongoing 11/06/22  2.  Patient will report at least 75% improvement in R knee pain to improve QOL. Baseline: 10/10 Goal status: met 02/03/23 60% better 11/20/22, 65% 12/06/22, 70% 12/17/22  3.  Patient will demonstrate improved R knee AROM to >/= 0-120 deg to allow for normal gait and stair mechanics. Baseline: 30-0-60 10/09/22: 16-0-78  11/01/22: 14-0-83 11/20/22: 10-97 12/06/22: 10-0-100 12/17/22: 8-0-104 05/13/23: 5-0-115 Goal status: continue to progress and getting closer 0-112  03/13/23. Ongoing 05/13/23  4.  Patient  will demonstrate improved functional LE strength as demonstrated by 5/5. Baseline: 2/5 Goal status: MET 3+/5 10/09/22, 5/5 11/01/22   5.  Patient will be able to ambulate 500' with LRAD and normal gait pattern without increased pain to access community.  Baseline: using RW small in home ambulation distances Goal status: progressing w/SPC 12/02/22, doing some walking without AD, MET 05/13/23  6.  Patient will report 39 on FOTO (patient reported outcome measure) to demonstrate improved functional ability. Baseline: 4, 40-11/11/24 Goal status: MET 53 12/17/22  7.  Decrease lumbar pain 50%  Goal status: ongoing 05/01/23, 45% 05/13/23  8.  Increase lumbar ROM 25%  Goal status: ongoing 04/24/23, ongoing 05/13/23-- extension, L side bending, and R rotation is very limited and painful   9.  Report able to put shoes and socks on without pain > 4/10  Goal status: ongoing still pain 7/10 05/01/23, MET 05/13/23  PLAN:  PT FREQUENCY: 2x/week  PT DURATION: 12 weeks  PLANNED INTERVENTIONS: Therapeutic exercises, Therapeutic activity, Neuromuscular re-education, Balance training, Gait training, Patient/Family education, Self Care, Joint mobilization, Stair training, Electrical stimulation, Cryotherapy, Moist heat, scar mobilization, Vasopneumatic device, and Manual therapy  PLAN FOR NEXT SESSION: may add prone over pillows, working to prone and road kill positions   Patient Details  Name: [name] MRN: [id number] Date of Birth: [birth year] Referring Provider:  Margarete Sharps, MD  Encounter Date: 06/04/2023   Hollis Lurie, PT 06/04/2023, 9:31 AM

## 2023-06-09 ENCOUNTER — Encounter: Payer: Self-pay | Admitting: Professional Counselor

## 2023-06-09 ENCOUNTER — Ambulatory Visit: Admitting: Physical Therapy

## 2023-06-09 ENCOUNTER — Ambulatory Visit: Payer: PRIVATE HEALTH INSURANCE | Admitting: Professional Counselor

## 2023-06-09 ENCOUNTER — Encounter: Payer: Self-pay | Admitting: Physical Therapy

## 2023-06-09 DIAGNOSIS — M549 Dorsalgia, unspecified: Secondary | ICD-10-CM | POA: Diagnosis not present

## 2023-06-09 DIAGNOSIS — Z96651 Presence of right artificial knee joint: Secondary | ICD-10-CM

## 2023-06-09 DIAGNOSIS — F411 Generalized anxiety disorder: Secondary | ICD-10-CM | POA: Diagnosis not present

## 2023-06-09 DIAGNOSIS — M6281 Muscle weakness (generalized): Secondary | ICD-10-CM

## 2023-06-09 DIAGNOSIS — M25661 Stiffness of right knee, not elsewhere classified: Secondary | ICD-10-CM

## 2023-06-09 DIAGNOSIS — G8929 Other chronic pain: Secondary | ICD-10-CM

## 2023-06-09 DIAGNOSIS — F331 Major depressive disorder, recurrent, moderate: Secondary | ICD-10-CM | POA: Diagnosis not present

## 2023-06-09 NOTE — Progress Notes (Signed)
      [facility] Counselor/Therapist Progress Note  Patient ID: [name], MRN: [id number],    Date: 06/09/2023  Time Spent: 3:09 PM to 4:10 PM  Treatment Type: Family with patient  Pt present with spouse to session.  Reported Symptoms: Worries, preoccupying thoughts, restlessness, stress, low mood, irritability, interpersonal concerns  Mental Status Exam:  Appearance:   Casual     Behavior:  Appropriate, Sharing, and Motivated  Motor:  Normal  Speech/Language:   Clear and Coherent and Normal Rate  Affect:  Appropriate and Congruent  Mood:  normal  Thought process:  normal  Thought content:    WNL  Sensory/Perceptual disturbances:    WNL  Orientation:  oriented to person, place, time/date, and situation  Attention:  Good  Concentration:  Good  Memory:  WNL  Fund of knowledge:   Good  Insight:    Good  Judgment:   Good  Impulse Control:  Good   Risk Assessment: Danger to Self:  No Self-injurious Behavior: No Danger to Others: No Duty to Warn:no Physical Aggression / Violence:No  Access to Firearms a concern: No  Gang Involvement:No   Subjective: Patient presented to session to address concerns of anxiety and depression.  She presented to session with her husband.  She voiced mixed progress at this time.  Patient reported to have breast revisioning surgery, and separate procedure (pain management injection) related to her back pain, scheduled, and to be hopeful regarding outcome in spite of overall stress and worry regarding circumstances.  Patient voiced excitement about having a grandchild due in August.  Patient continued to voice concern regarding marital relationship, and challenges therein.  Counselor helped patient and patient's spouse with interpersonal effectiveness skills, and provided psychoeducation regarding harsh start up per [employer] and trauma repair per [employer].  Counselor helped patient and spouse with resources, and identifying short-term goals.   Patient identified 41-year anniversary between she and spouse as being tomorrow; counselor celebrated milestone with couple, helping to reinforce their strengths and resiliency.  Interventions: Assertiveness/Communication, Solution-Oriented/Positive Psychology, Humanistic/Existential, Insight-Oriented, Family Systems, and Interpersonal, [employer]  Diagnosis:   ICD-10-CM   1. Generalized anxiety disorder  F41.1     2. Major depressive disorder, recurrent episode, moderate (HCC)  F33.1       Plan: Patient is scheduled for follow-up; continue process work and developing coping skills.  STG between sessions for patient to continue to work on emotional regulation skills, practicing nonviolent communication skill set, continue to attend church and practice devotionals with husband, consider engaging with husband with [employer] exercise per [employer].  Progress note was dictated with Dragon and reviewed for accuracy.  Almarie ONEIDA Sprang, [facility]

## 2023-06-09 NOTE — Therapy (Signed)
 OUTPATIENT PHYSICAL THERAPY LOWER EXTREMITY TREATMENT    Patient Name: [name] MRN: [id number] DOB:[birth year], 63 y.o., female Today's Date: 06/09/2023  END OF SESSION:  PT End of Session - 06/09/23 0932     Visit Number 88    Date for PT Re-Evaluation 07/19/23    Authorization Type [employer]    PT Start Time 0930    PT Stop Time 1015    PT Time Calculation (min) 45 min    Activity Tolerance Patient tolerated treatment well    Behavior During Therapy WFL for tasks assessed/performed               Past Medical History:  Diagnosis Date   Arthritis    Back pain of lumbar region with sciatica    Cancer (HCC)    breast   Depression    Diabetes mellitus, type II (HCC)    Family history of anesthesia complication    grandfather died under anesthesia 70's- had heart issues   H/O carpal tunnel repair 2014   Headache(784.0)    Hyperlipidemia    Hypertension    Hypothyroidism    Medial meniscus tear    PONV (postoperative nausea and vomiting)    Sleep apnea    cpap since 10   Past Surgical History:  Procedure Laterality Date   BILATERAL TOTAL MASTECTOMY WITH AXILLARY LYMPH NODE DISSECTION     BREAST RECONSTRUCTION     CERVICAL DISC ARTHROPLASTY N/A 03/19/2013   Procedure: CERVICAL FIVE TO SIX, CERVICAL SIX TO SEVEN CERVICAL ANTERIOR DISC ARTHROPLASTY;  Surgeon: Alicia Haggis, MD;  Location: [facility];  Service: Neurosurgery;  Laterality: N/A;  C56 C67 artificial disc replacement   CESAREAN SECTION     COLONOSCOPY     DILATATION & CURETTAGE/HYSTEROSCOPY WITH TRUECLEAR N/A 11/25/2013   Procedure: DILATATION & CURETTAGE/HYSTEROSCOPY WITH TRUCLEAR;  Surgeon: Alicia Brewer, MD;  Location: [facility];  Service: Gynecology;  Laterality: N/A;   ECTOPIC PREGNANCY SURGERY     GALLBLADDER SURGERY     MOUTH SURGERY     child- dog bite   SPINAL FUSION     2023   STERIOD INJECTION Left 09/09/2022   Procedure: LEFT KNEE STEROID INJECTION;  Surgeon: Alicia Rei, MD;   Location: [facility];  Service: Orthopedics;  Laterality: Left;  left knee injection 0928   TONSILLECTOMY     TOTAL KNEE ARTHROPLASTY Right 09/09/2022   Procedure: TOTAL KNEE ARTHROPLASTY;  Surgeon: Alicia Rei, MD;  Location: [facility];  Service: Orthopedics;  Laterality: Right;   TUBAL LIGATION     Patient Active Problem List   Diagnosis Date Noted   OA (osteoarthritis) of knee 09/09/2022   Exertional dyspnea 04/17/2022   Elevated coronary artery calcium  score 04/17/2022   Spondylolisthesis at L4-L5 level 12/27/2021   Ductal carcinoma in situ (DCIS) of left breast 01/19/2018   Generalized anxiety disorder 11/07/2017   Depression 11/07/2017   Insomnia 11/07/2017   Cervical spondylosis 03/19/2013    PCP: [name]  REFERRING PROVIDER: Athena Moore  REFERRING DIAG: R TKA 09/09/22  THERAPY DIAG:  Chronic bilateral back pain, unspecified back location  S/P total knee arthroplasty, right  Stiffness of right knee, not elsewhere classified  Muscle weakness (generalized)  Rationale for Evaluation and Treatment: Rehabilitation  ONSET DATE: 09/09/22  SUBJECTIVE:   SUBJECTIVE STATEMENT :  She will have surgery in June on her breast  Saw Dr. Ellery Moore last Friday, has a bulging disc on the right at L2, recommended continuation of PT.  Will have injection in back May 20th R TKA 8/19 L knee steroid injection 09/09/22  PAIN:  Are you having pain? Yes: NPRS scale: 6/10 Pain location: right low back, buttock and reports around to the front of the thigh Pain description: sharp really hurts Aggravating factors: difficulty sleeping, difficulty walking and standing and sitting Relieving factors: heat, Aleve , tramadol  and methacarbomol  PRECAUTIONS: None  RED FLAGS: None   WEIGHT BEARING RESTRICTIONS: No  FALLS:  Has patient fallen in last 6 months? No  LIVING ENVIRONMENT: Lives with: lives with their family and lives with their spouse Lives in: House/apartment Stairs: Yes:  External: 3 steps; none Has following equipment at home: Single point cane and Walker - 2 wheeled  OCCUPATION: Not working  PLOF: Independent  PATIENT GOALS: less back pain  NEXT MD VISIT: 09/24/22  OBJECTIVE:   PATIENT SURVEYS:  FOTO 4  COGNITION: Overall cognitive status: Within functional limits for tasks assessed      EDEMA:  Increased swelling surrounding R knee  POSTURE: rounded shoulders and flexed trunk   PALPATION: Warm and TTP  LOWER EXTREMITY ROM:  Active ROM Right eval Left eval Right 09/24/22 Right 09/26/22 Right 10/02/22  11/01/22 12/09/22  Hip flexion          Hip extension          Hip abduction          Hip adduction          Hip internal rotation          Hip external rotation          Knee flexion  60  75* AROM, 86 PROM 82* AROM seated per pt request  78* self AAROM, 76* AROM  PROM 87 AROM 81 PROM 90 AROM 83 AROM 101 PROM 105  Knee extension -30  -15* AROM seated, -15 PROM supine   -16* supine heel prop with quad set  AROM 25 PROM 16 AROM 14 AROM 5 PROM 0  Ankle dorsiflexion          Ankle plantarflexion          Ankle inversion          Ankle eversion           (Blank rows = not tested)  LOWER EXTREMITY MMT:  MMT Right eval Left eval  Hip flexion 2   Hip extension    Hip abduction    Hip adduction    Hip internal rotation    Hip external rotation    Knee flexion 2   Knee extension 2   Ankle dorsiflexion    Ankle plantarflexion    Ankle inversion    Ankle eversion     (Blank rows = not tested)  LUMBAR ROM:  decreased 50% flexion, decreased 75% for extension and side bending, pain was worse with extension and right side bending. Hooklying is less painful that lying flat on back Mild tightness with pain during HS stretch, tight and painful piriformis bilaterally worse on the right , very weak core  FUNCTIONAL TESTS:  5 times sit to stand: 30 seconds 03/20/23 Timed up and go (TUG): 1 min 28s  GAIT: Is no without assistive device,  mild antalgic on the right    TODAY'S TREATMENT:  DATE:  06/09/23 Bike level 5 x 6 minutes Gait outside around the back building Seated rows 20# Lats 20# Straight arm pulls 5# Feet on ball K2C, rotation, bridge, isometric abs Passive stretch LE's STM to the Low back  06/04/23 Bike Level 5 x 6 minutes Walk around the parking [place] on a[place]x straight arm pulls 5# 15# seated rows Feet on ball K2C, rotation, small bridge, isometric abs Passive stretch LE's Manual sheet traction STM with the T-gun right low back and right buttock  06/02/23 Bike level 4 x 6 minutes Prone over two pillows x 2 minutes Then in road kill position with right leg ER and out for 2 minutes Passive stretch LE's STM to the right low back and right buttock Manual sheet traction  05/28/23 Bike level 4 x 6 minutes 5# straight arm pulls Feet on ball K2C, rotation, small bridge, isometric abs Passive stretch LE's Sheet traction STM to the right buttock and back  05/26/23 Bike level 5 x 6 minutes 5# straight arm pulls 20# rows 2x10 20# lats 2x10 5# AR press Passive stretch LE's STM to the right buttock, HS and low back Manual sheet traction lumbar  05/22/23 PROM LE's STM to the buttocks, HS and Lumbar area into the thoracic area Manual sheet traction  05/19/23 Bike level 4 x 6 minutes 5# straight arm pulls Seated rows 20# Lats 20# 10# AR press some pain in leg so we stopped Feet on ball K2C, rotation, bridges, isometric abs Passive stretch, STM to the lumbar area left, sheet traction   05/15/23 Bike level 5 x 5 minutes 5# straight arm pulls cues for core activation Feet on ball K2C, rotation, small bridge, isometric abs Passive LE stretches STM to the right buttock and the bilateral lumbar paraspinals Manual sheet tractoin  05/13/23 Recheck goals for progress note  Gait  around parking lot  NuStep L5x40mins  5# straight arm pulls 2x10 AR press 10# 2x10 Calf stretch 30s  Supine LE stretches- LTR, HS, SKTC, figure 4    05/07/23 Bike level 5 x 6 minutes 5# straight arm pulls 5 # hip extension some pain Feet on ball K2C, rotation, small bridge, isometric abs Manual sheet traction STM with the Tgun in the right low back and buttock  05/05/23 Bike level 5 x 6 minutes Gait outside around the parking [place] 5# straight arm pulls 15# lats Feet on ball K2C, rotation, bridges, isometric abs Manual sheet traction STM to the right low back and buttock  05/01/23 Bike level 4 x  minute 5# straight arm pulls 5# AR press Hip extension no weight Hip abduction no weight Feet on ball K2C, trunk rotation, posterior isometrics and isometric abs STM to back and hips Manual sheet traction gently  04/28/23 Bike level 4 x 5 minutes with towel roll behind back 5# straight arm pulls cues for posture and core activation Feet on ball K2C, rotation, bridge, isometric abs Passive LE stretches STM to the right low back and buttock Tgun to above Manual sheet traction  04/24/23 Gait walking around the back building, she reported no increase of LBP but was already pain at a 7/10, she reports that she did not have to push on her back with her hands Feet on ball K2C, rotation, small bridge, isometric abs Passive LE stretches Passive gentle sheet lumbar traction STM with the T-gun to the lumbar area and the glutes  04/21/23 Gait outside around the parking [employer] level 4 x 5 minutes 5# straight arm pulls  mild increase of LBP Feet on ball K2C, rotation, smaal posterior activation , isometric abs Passive LE stretches STM with the T-gun to the right low back and buttock on the right Manual pelvic traction  04/17/23 Gait outside descent pace, this does increase pain Bike level 3 x 5 minutes PROM LE's STM with the T-gun to the low back and buttock Gentle manual sheet  traction  04/15/23 Nustep level 5 x 6 minutes Bike level 3 x 5 minutes Leg press 20# and no weight  5# straight arm pulls STM to the right low back and buttock Gentle manual pelvic traction with sheet  04/10/23 Sit ft pelvic ROM 4 way s 15 x each Nustep L 5 Red tband seated trunk ext 2 sets 10,10 x each trunk rotation Iso abs with ball 10 x hold 3 sec Feet on ball bridge, KTC and obl ADD ball squeeze Clams and hip flex red tband 2 sets 10 Passive stretch to the LE's Very gentle manual sheet traction lumbar   04/08/23 STM with the Tgun to the right low back and right buttock Feet on ball K2C, small rotation, small bridge and isometric abs Passive stretch to the LE's Very gentle manual sheet traction lumbar  MHP/IFC to the right low back in supine feet elevated  04/03/23 Nustep L 5 6 min Sitting on dyna disc pelvic ROM 10x for pelvic ROM and stab Sitting on dyna disc for stab LAQ,hip flex,hip abd 10 x each Sitting on dyna disc shld row and ext red tband 2 sets 10 Supine LE stretches- HS, LTR, SKTC, piriformis, glutes RT LE NT stretching-positive MH/IFC RT LB supine   04/01/23 Walk outdoors  NuStep L5x43mins  Seated row 20# 2x10 Lat pull down 20# 2x10 BlackTB ext 2x10 Supine LE stretches- HS, LTR, SKTC, piriformis, glutes    03/26/23 NuStep L5 x 6 min Bridges LE on pball bridges x5, K2C x10, Oblq   Passive stretching to HS, piriformis, ITB, Glute Shoulder Ext 5lb x10 Standing rows 5lb x12  03/20/23 Evaluation of the lumbar spine order from Dr. Ellery Moore  PATIENT EDUCATION:  Education details: POC and HEP Person educated: Patient Education method: Explanation Education comprehension: verbalized understanding  HOME EXERCISE PROGRAM: Access Code: [facility] Access Code: [email or web address] URL: [email or web address] Date: 03/20/2023 Prepared by: Cherylene Corrente  Exercises - Hooklying Single Knee to Chest Stretch  - 2 x daily - 7 x weekly - 1 sets - 10 reps  - 5 hold - Supine Double Knee to Chest  - 2 x daily - 7 x weekly - 1 sets - 10 reps - 5 hold - Supine Lower Trunk Rotation with PLB  - 2 x daily - 7 x weekly - 1 sets - 10 reps - 5 hold - Supine Posterior Pelvic Tilt  - 2 x daily - 7 x weekly - 1 sets - 10 reps - 3 hold  ASSESSMENT:  CLINICAL IMPRESSION:   there is a right disc herniation at L2-3.  She is doing okay today, has concerns about breast surgery next month and her back still hurting, she is walking very well limited to no noticeable limp, OBJECTIVE IMPAIRMENTS: Abnormal gait, cardiopulmonary status limiting activity, decreased activity tolerance, decreased endurance, decreased mobility, difficulty walking, decreased ROM, decreased strength, increased muscle spasms, impaired flexibility, improper body mechanics, postural dysfunction, and pain.   REHAB POTENTIAL: Good  CLINICAL DECISION MAKING: Stable/uncomplicated  EVALUATION COMPLEXITY: Low   GOALS: Goals reviewed with patient? Yes  SHORT TERM GOALS: Target date: 10/24/22  Patient will be independent with initial HEP. Goal status: 09/20/22 met  2.  Patient will be do TUG < 30s Baseline: 1 min 28s  Goal status: ongoing 10/07/22, 18.57s MET 10/09/22  3.  Patient will be able to do 5 sit to stands  Baseline: unable to do  Goal status: progressing 10/04/22, MET 10/09/22   LONG TERM GOALS: Target date: 05/18/23  Patient will be independent with advanced/ongoing HEP to improve outcomes and carryover.  Goal status: ongoing 11/06/22  2.  Patient will report at least 75% improvement in R knee pain to improve QOL. Baseline: 10/10 Goal status: met 02/03/23 60% better 11/20/22, 65% 12/06/22, 70% 12/17/22  3.  Patient will demonstrate improved R knee AROM to >/= 0-120 deg to allow for normal gait and stair mechanics. Baseline: 30-0-60 10/09/22: 16-0-78  11/01/22: 14-0-83 11/20/22: 10-97 12/06/22: 10-0-100 12/17/22: 8-0-104 05/13/23: 5-0-115 Goal status: continue to progress  and getting closer 0-112  03/13/23. Ongoing 05/13/23  4.  Patient will demonstrate improved functional LE strength as demonstrated by 5/5. Baseline: 2/5 Goal status: MET 3+/5 10/09/22, 5/5 11/01/22   5.  Patient will be able to ambulate 500' with LRAD and normal gait pattern without increased pain to access community.  Baseline: using RW small in home ambulation distances Goal status: progressing w/SPC 12/02/22, doing some walking without AD, MET 05/13/23  6.  Patient will report 12 on FOTO (patient reported outcome measure) to demonstrate improved functional ability. Baseline: 4, 40-11/11/24 Goal status: MET 53 12/17/22  7.  Decrease lumbar pain 50%  Goal status: ongoing 05/01/23, 45% 05/13/23  8.  Increase lumbar ROM 25%  Goal status: ongoing 04/24/23, ongoing 05/13/23-- extension, L side bending, and R rotation is very limited and painful   9.  Report able to put shoes and socks on without pain > 4/10  Goal status: ongoing still pain 7/10 05/01/23, MET 05/13/23  PLAN:  PT FREQUENCY: 2x/week  PT DURATION: 12 weeks  PLANNED INTERVENTIONS: Therapeutic exercises, Therapeutic activity, Neuromuscular re-education, Balance training, Gait training, Patient/Family education, Self Care, Joint mobilization, Stair training, Electrical stimulation, Cryotherapy, Moist heat, scar mobilization, Vasopneumatic device, and Manual therapy  PLAN FOR NEXT SESSION: may add prone over pillows, working to prone and road kill positions   Patient Details  Name: [name] MRN: [id number] Date of Birth: May 13, 1960 Referring Provider:  Margarete Sharps, MD  Encounter Date: 06/09/2023   Hollis Lurie, PT 06/09/2023, 9:33 AM

## 2023-06-10 ENCOUNTER — Ambulatory Visit: Admitting: Professional Counselor

## 2023-06-11 ENCOUNTER — Ambulatory Visit: Admitting: Professional Counselor

## 2023-06-11 ENCOUNTER — Ambulatory Visit: Admitting: Physical Therapy

## 2023-06-17 ENCOUNTER — Ambulatory Visit: Admitting: Physical Therapy

## 2023-06-17 ENCOUNTER — Encounter: Payer: Self-pay | Admitting: Physical Therapy

## 2023-06-17 DIAGNOSIS — M25661 Stiffness of right knee, not elsewhere classified: Secondary | ICD-10-CM

## 2023-06-17 DIAGNOSIS — G8929 Other chronic pain: Secondary | ICD-10-CM

## 2023-06-17 DIAGNOSIS — M549 Dorsalgia, unspecified: Secondary | ICD-10-CM | POA: Diagnosis not present

## 2023-06-17 DIAGNOSIS — M6281 Muscle weakness (generalized): Secondary | ICD-10-CM

## 2023-06-17 DIAGNOSIS — Z96651 Presence of right artificial knee joint: Secondary | ICD-10-CM

## 2023-06-17 NOTE — Therapy (Signed)
 OUTPATIENT PHYSICAL THERAPY LOWER EXTREMITY TREATMENT    Patient Name: [name] MRN: [id number] DOB:1960-11-26, 63 y.o., female Today's Date: 06/17/2023  END OF SESSION:  PT End of Session - 06/17/23 1322     Visit Number 89    Date for PT Re-Evaluation 07/19/23    Authorization Type [employer]    PT Start Time 1315    PT Stop Time 1400    PT Time Calculation (min) 45 min    Activity Tolerance Patient tolerated treatment well    Behavior During Therapy WFL for tasks assessed/performed               Past Medical History:  Diagnosis Date   Arthritis    Back pain of lumbar region with sciatica    Cancer (HCC)    breast   Depression    Diabetes mellitus, type II (HCC)    Family history of anesthesia complication    grandfather died under anesthesia 70's- had heart issues   H/O carpal tunnel repair 2014   Headache(784.0)    Hyperlipidemia    Hypertension    Hypothyroidism    Medial meniscus tear    PONV (postoperative nausea and vomiting)    Sleep apnea    cpap since 10   Past Surgical History:  Procedure Laterality Date   BILATERAL TOTAL MASTECTOMY WITH AXILLARY LYMPH NODE DISSECTION     BREAST RECONSTRUCTION     CERVICAL DISC ARTHROPLASTY N/A 03/19/2013   Procedure: CERVICAL FIVE TO SIX, CERVICAL SIX TO SEVEN CERVICAL ANTERIOR DISC ARTHROPLASTY;  Surgeon: Elna Haggis, MD;  Location: [facility];  Service: Neurosurgery;  Laterality: N/A;  C56 C67 artificial disc replacement   CESAREAN SECTION     COLONOSCOPY     DILATATION & CURETTAGE/HYSTEROSCOPY WITH TRUECLEAR N/A 11/25/2013   Procedure: DILATATION & CURETTAGE/HYSTEROSCOPY WITH TRUCLEAR;  Surgeon: Piedad Brewer, MD;  Location: [facility];  Service: Gynecology;  Laterality: N/A;   ECTOPIC PREGNANCY SURGERY     GALLBLADDER SURGERY     MOUTH SURGERY     child- dog bite   SPINAL FUSION     2023   STERIOD INJECTION Left 09/09/2022   Procedure: LEFT KNEE STEROID INJECTION;  Surgeon: Liliane Rei, MD;   Location: [facility];  Service: Orthopedics;  Laterality: Left;  left knee injection 0928   TONSILLECTOMY     TOTAL KNEE ARTHROPLASTY Right 09/09/2022   Procedure: TOTAL KNEE ARTHROPLASTY;  Surgeon: Liliane Rei, MD;  Location: [facility];  Service: Orthopedics;  Laterality: Right;   TUBAL LIGATION     Patient Active Problem List   Diagnosis Date Noted   OA (osteoarthritis) of knee 09/09/2022   Exertional dyspnea 04/17/2022   Elevated coronary artery calcium  score 04/17/2022   Spondylolisthesis at L4-L5 level 12/27/2021   Ductal carcinoma in situ (DCIS) of left breast 01/19/2018   Generalized anxiety disorder 11/07/2017   Depression 11/07/2017   Insomnia 11/07/2017   Cervical spondylosis 03/19/2013    PCP: Margarete Sharps  REFERRING PROVIDER: Athena Lazier  REFERRING DIAG: R TKA 09/09/22  THERAPY DIAG:  Chronic bilateral back pain, unspecified back location  S/P total knee arthroplasty, right  Stiffness of right knee, not elsewhere classified  Muscle weakness (generalized)  Rationale for Evaluation and Treatment: Rehabilitation  ONSET DATE: 09/09/22  SUBJECTIVE:   SUBJECTIVE STATEMENT :  Patient had a lumbar injection in the low back on last Wednesday, reports feeling better, tried some exercise and it hurt and she stopped  Saw Dr. Ellery Guthrie last  Friday, has a bulging disc on the right at L2, recommended continuation of PT.  Will have injection in back May 20th R TKA 8/19 L knee steroid injection 09/09/22  PAIN:  Are you having pain? Yes: NPRS scale: 4/10 Pain location: right low back, buttock and reports around to the front of the thigh Pain description: sharp really hurts Aggravating factors: difficulty sleeping, difficulty walking and standing and sitting Relieving factors: heat, Aleve , tramadol  and methacarbomol  PRECAUTIONS: None  RED FLAGS: None   WEIGHT BEARING RESTRICTIONS: No  FALLS:  Has patient fallen in last 6 months? No  LIVING ENVIRONMENT: Lives with:  lives with their family and lives with their spouse Lives in: House/apartment Stairs: Yes: External: 3 steps; none Has following equipment at home: Single point cane and Walker - 2 wheeled  OCCUPATION: Not working  PLOF: Independent  PATIENT GOALS: less back pain  NEXT MD VISIT: 09/24/22  OBJECTIVE:   PATIENT SURVEYS:  FOTO 4  COGNITION: Overall cognitive status: Within functional limits for tasks assessed      EDEMA:  Increased swelling surrounding R knee  POSTURE: rounded shoulders and flexed trunk   PALPATION: Warm and TTP  LOWER EXTREMITY ROM:  Active ROM Right eval Left eval Right 09/24/22 Right 09/26/22 Right 10/02/22  11/01/22 12/09/22  Hip flexion          Hip extension          Hip abduction          Hip adduction          Hip internal rotation          Hip external rotation          Knee flexion  60  75* AROM, 86 PROM 82* AROM seated per pt request  78* self AAROM, 76* AROM  PROM 87 AROM 81 PROM 90 AROM 83 AROM 101 PROM 105  Knee extension -30  -15* AROM seated, -15 PROM supine   -16* supine heel prop with quad set  AROM 25 PROM 16 AROM 14 AROM 5 PROM 0  Ankle dorsiflexion          Ankle plantarflexion          Ankle inversion          Ankle eversion           (Blank rows = not tested)  LOWER EXTREMITY MMT:  MMT Right eval Left eval  Hip flexion 2   Hip extension    Hip abduction    Hip adduction    Hip internal rotation    Hip external rotation    Knee flexion 2   Knee extension 2   Ankle dorsiflexion    Ankle plantarflexion    Ankle inversion    Ankle eversion     (Blank rows = not tested)  LUMBAR ROM:  decreased 50% flexion, decreased 75% for extension and side bending, pain was worse with extension and right side bending. Hooklying is less painful that lying flat on back Mild tightness with pain during HS stretch, tight and painful piriformis bilaterally worse on the right , very weak core  FUNCTIONAL TESTS:  5 times sit to stand:  30 seconds 03/20/23 Timed up and go (TUG): 1 min 28s  GAIT: Is no without assistive device, mild antalgic on the right    TODAY'S TREATMENT:  DATE:  06/17/23 Bike level 4 x 6 minutes Back to wall small scapular touches Back to wall weighted ball overhead reach Small standing extension Feet on ball K2C, small rotation, posterior isometrics, ab isometrics Passive stretch LE's Ball b/n knees squeeze Green tband clamshells Blue tband supine hip extension  06/09/23 Bike level 5 x 6 minutes Gait outside around the back building Seated rows 20# Lats 20# Straight arm pulls 5# Feet on ball K2C, rotation, bridge, isometric abs Passive stretch LE's STM to the Low back  06/04/23 Bike Level 5 x 6 minutes Walk around the parking [place] on airex straight arm pulls 5# 15# seated rows Feet on ball K2C, rotation, small bridge, isometric abs Passive stretch LE's Manual sheet traction STM with the T-gun right low back and right buttock  06/02/23 Bike level 4 x 6 minutes Prone over two pillows x 2 minutes Then in road kill position with right leg ER and out for 2 minutes Passive stretch LE's STM to the right low back and right buttock Manual sheet traction  05/28/23 Bike level 4 x 6 minutes 5# straight arm pulls Feet on ball K2C, rotation, small bridge, isometric abs Passive stretch LE's Sheet traction STM to the right buttock and back  05/26/23 Bike level 5 x 6 minutes 5# straight arm pulls 20# rows 2x10 20# lats 2x10 5# AR press Passive stretch LE's STM to the right buttock, HS and low back Manual sheet traction lumbar  05/22/23 PROM LE's STM to the buttocks, HS and Lumbar area into the thoracic area Manual sheet traction  05/19/23 Bike level 4 x 6 minutes 5# straight arm pulls Seated rows 20# Lats 20# 10# AR press some pain in leg so we  stopped Feet on ball K2C, rotation, bridges, isometric abs Passive stretch, STM to the lumbar area left, sheet traction   05/15/23 Bike level 5 x 5 minutes 5# straight arm pulls cues for core activation Feet on ball K2C, rotation, small bridge, isometric abs Passive LE stretches STM to the right buttock and the bilateral lumbar paraspinals Manual sheet tractoin  05/13/23 Recheck goals for progress note  Gait around parking lot  NuStep L5x45mins  5# straight arm pulls 2x10 AR press 10# 2x10 Calf stretch 30s  Supine LE stretches- LTR, HS, SKTC, figure 4    05/07/23 Bike level 5 x 6 minutes 5# straight arm pulls 5 # hip extension some pain Feet on ball K2C, rotation, small bridge, isometric abs Manual sheet traction STM with the Tgun in the right low back and buttock  05/05/23 Bike level 5 x 6 minutes Gait outside around the parking [place] 5# straight arm pulls 15# lats Feet on ball K2C, rotation, bridges, isometric abs Manual sheet traction STM to the right low back and buttock  05/01/23 Bike level 4 x  minute 5# straight arm pulls 5# AR press Hip extension no weight Hip abduction no weight Feet on ball K2C, trunk rotation, posterior isometrics and isometric abs STM to back and hips Manual sheet traction gently  04/28/23 Bike level 4 x 5 minutes with towel roll behind back 5# straight arm pulls cues for posture and core activation Feet on ball K2C, rotation, bridge, isometric abs Passive LE stretches STM to the right low back and buttock Tgun to above Manual sheet traction  04/24/23 Gait walking around the back building, she reported no increase of LBP but was already pain at a 7/10, she reports that she did not have to push on her back  with her hands Feet on ball K2C, rotation, small bridge, isometric abs Passive LE stretches Passive gentle sheet lumbar traction STM with the T-gun to the lumbar area and the glutes  04/21/23 Gait outside around the parking  [employer] level 4 x 5 minutes 5# straight arm pulls mild increase of LBP Feet on ball K2C, rotation, smaal posterior activation , isometric abs Passive LE stretches STM with the T-gun to the right low back and buttock on the right Manual pelvic traction  04/17/23 Gait outside descent pace, this does increase pain Bike level 3 x 5 minutes PROM LE's STM with the T-gun to the low back and buttock Gentle manual sheet traction  04/15/23 Nustep level 5 x 6 minutes Bike level 3 x 5 minutes Leg press 20# and no weight  5# straight arm pulls STM to the right low back and buttock Gentle manual pelvic traction with sheet  04/10/23 Sit ft pelvic ROM 4 way s 15 x each Nustep L 5 Red tband seated trunk ext 2 sets 10,10 x each trunk rotation Iso abs with ball 10 x hold 3 sec Feet on ball bridge, KTC and obl ADD ball squeeze Clams and hip flex red tband 2 sets 10 Passive stretch to the LE's Very gentle manual sheet traction lumbar   04/08/23 STM with the Tgun to the right low back and right buttock Feet on ball K2C, small rotation, small bridge and isometric abs Passive stretch to the LE's Very gentle manual sheet traction lumbar  MHP/IFC to the right low back in supine feet elevated  04/03/23 Nustep L 5 6 min Sitting on dyna disc pelvic ROM 10x for pelvic ROM and stab Sitting on dyna disc for stab LAQ,hip flex,hip abd 10 x each Sitting on dyna disc shld row and ext red tband 2 sets 10 Supine LE stretches- HS, LTR, SKTC, piriformis, glutes RT LE NT stretching-positive MH/IFC RT LB supine   04/01/23 Walk outdoors  NuStep L5x48mins  Seated row 20# 2x10 Lat pull down 20# 2x10 BlackTB ext 2x10 Supine LE stretches- HS, LTR, SKTC, piriformis, glutes    03/26/23 NuStep L5 x 6 min Bridges LE on pball bridges x5, K2C x10, Oblq   Passive stretching to HS, piriformis, ITB, Glute Shoulder Ext 5lb x10 Standing rows 5lb x12  03/20/23 Evaluation of the lumbar spine order from Dr.  Ellery Guthrie  PATIENT EDUCATION:  Education details: POC and HEP Person educated: Patient Education method: Explanation Education comprehension: verbalized understanding  HOME EXERCISE PROGRAM: Access Code: [facility] Access Code: [email or web address] URL: [email or web address] Date: 03/20/2023 Prepared by: Cherylene Corrente  Exercises - Hooklying Single Knee to Chest Stretch  - 2 x daily - 7 x weekly - 1 sets - 10 reps - 5 hold - Supine Double Knee to Chest  - 2 x daily - 7 x weekly - 1 sets - 10 reps - 5 hold - Supine Lower Trunk Rotation with PLB  - 2 x daily - 7 x weekly - 1 sets - 10 reps - 5 hold - Supine Posterior Pelvic Tilt  - 2 x daily - 7 x weekly - 1 sets - 10 reps - 3 hold  ASSESSMENT:  CLINICAL IMPRESSION:   there is a right disc herniation at L2-3.  She had an injection in the back last week it has helped some bring the pain down from 6 to a 4/10, she is having some pain with K2C at home so asked her to  stop this, had some pain  into the right thigh with even small extension, asked her to try prone over two pillows, I advised to have no pain especially pain peripherally  OBJECTIVE IMPAIRMENTS: Abnormal gait, cardiopulmonary status limiting activity, decreased activity tolerance, decreased endurance, decreased mobility, difficulty walking, decreased ROM, decreased strength, increased muscle spasms, impaired flexibility, improper body mechanics, postural dysfunction, and pain.   REHAB POTENTIAL: Good  CLINICAL DECISION MAKING: Stable/uncomplicated  EVALUATION COMPLEXITY: Low   GOALS: Goals reviewed with patient? Yes  SHORT TERM GOALS: Target date: 10/24/22  Patient will be independent with initial HEP. Goal status: 09/20/22 met  2.  Patient will be do TUG < 30s Baseline: 1 min 28s  Goal status: ongoing 10/07/22, 18.57s MET 10/09/22  3.  Patient will be able to do 5 sit to stands  Baseline: unable to do  Goal status: progressing 10/04/22, MET 10/09/22   LONG TERM  GOALS: Target date: 05/18/23  Patient will be independent with advanced/ongoing HEP to improve outcomes and carryover.  Goal status: ongoing 11/06/22  2.  Patient will report at least 75% improvement in R knee pain to improve QOL. Baseline: 10/10 Goal status: met 02/03/23 60% better 11/20/22, 65% 12/06/22, 70% 12/17/22  3.  Patient will demonstrate improved R knee AROM to >/= 0-120 deg to allow for normal gait and stair mechanics. Baseline: 30-0-60 10/09/22: 16-0-78  11/01/22: 14-0-83 11/20/22: 10-97 12/06/22: 10-0-100 12/17/22: 8-0-104 05/13/23: 5-0-115 Goal status: continue to progress and getting closer 0-112  03/13/23. Ongoing 05/13/23  4.  Patient will demonstrate improved functional LE strength as demonstrated by 5/5. Baseline: 2/5 Goal status: MET 3+/5 10/09/22, 5/5 11/01/22   5.  Patient will be able to ambulate 500' with LRAD and normal gait pattern without increased pain to access community.  Baseline: using RW small in home ambulation distances Goal status: progressing w/SPC 12/02/22, doing some walking without AD, MET 05/13/23  6.  Patient will report 30 on FOTO (patient reported outcome measure) to demonstrate improved functional ability. Baseline: 4, 40-11/11/24 Goal status: MET 53 12/17/22  7.  Decrease lumbar pain 50%  Goal status: ongoing 05/01/23, 45% 05/13/23  8.  Increase lumbar ROM 25%  Goal status: ongoing 04/24/23, ongoing 05/13/23-- extension, L side bending, and R rotation is very limited and painful   9.  Report able to put shoes and socks on without pain > 4/10  Goal status: ongoing still pain 7/10 05/01/23, MET 05/13/23  PLAN:  PT FREQUENCY: 2x/week  PT DURATION: 12 weeks  PLANNED INTERVENTIONS: Therapeutic exercises, Therapeutic activity, Neuromuscular re-education, Balance training, Gait training, Patient/Family education, Self Care, Joint mobilization, Stair training, Electrical stimulation, Cryotherapy, Moist heat, scar mobilization, Vasopneumatic  device, and Manual therapy  PLAN FOR NEXT SESSION: may add prone over pillows, working to prone and road kill positions   Patient Details  Name: ALYSIAH SUPPA MRN: [id number] Date of Birth: 1960/09/29 Referring Provider:  Margarete Sharps, MD  Encounter Date: 06/17/2023   Hollis Lurie, PT 06/17/2023, 1:22 PM

## 2023-06-19 ENCOUNTER — Ambulatory Visit: Admitting: Physical Therapy

## 2023-06-19 ENCOUNTER — Encounter: Payer: Self-pay | Admitting: Physical Therapy

## 2023-06-19 DIAGNOSIS — G8929 Other chronic pain: Secondary | ICD-10-CM

## 2023-06-19 DIAGNOSIS — R600 Localized edema: Secondary | ICD-10-CM

## 2023-06-19 DIAGNOSIS — M549 Dorsalgia, unspecified: Secondary | ICD-10-CM | POA: Diagnosis not present

## 2023-06-19 DIAGNOSIS — R2689 Other abnormalities of gait and mobility: Secondary | ICD-10-CM

## 2023-06-19 DIAGNOSIS — Z96651 Presence of right artificial knee joint: Secondary | ICD-10-CM

## 2023-06-19 DIAGNOSIS — M25561 Pain in right knee: Secondary | ICD-10-CM

## 2023-06-19 DIAGNOSIS — M25661 Stiffness of right knee, not elsewhere classified: Secondary | ICD-10-CM

## 2023-06-19 DIAGNOSIS — M6281 Muscle weakness (generalized): Secondary | ICD-10-CM

## 2023-06-19 NOTE — Therapy (Signed)
 OUTPATIENT PHYSICAL THERAPY LOWER EXTREMITY TREATMENT    Patient Name: [name] MRN: [id number] DOB:05-29-1960, 63 y.o., female Today's Date: 06/19/2023  END OF SESSION:  PT End of Session - 06/19/23 1317     Visit Number 90    Date for PT Re-Evaluation 07/19/23    Authorization Type [employer]    PT Start Time 1315    PT Stop Time 1400    PT Time Calculation (min) 45 min    Activity Tolerance Patient tolerated treatment well    Behavior During Therapy WFL for tasks assessed/performed               Past Medical History:  Diagnosis Date   Arthritis    Back pain of lumbar region with sciatica    Cancer (HCC)    breast   Depression    Diabetes mellitus, type II (HCC)    Family history of anesthesia complication    grandfather died under anesthesia 70's- had heart issues   H/O carpal tunnel repair 2014   Headache(784.0)    Hyperlipidemia    Hypertension    Hypothyroidism    Medial meniscus tear    PONV (postoperative nausea and vomiting)    Sleep apnea    cpap since 10   Past Surgical History:  Procedure Laterality Date   BILATERAL TOTAL MASTECTOMY WITH AXILLARY LYMPH NODE DISSECTION     BREAST RECONSTRUCTION     CERVICAL DISC ARTHROPLASTY N/A 03/19/2013   Procedure: CERVICAL FIVE TO SIX, CERVICAL SIX TO SEVEN CERVICAL ANTERIOR DISC ARTHROPLASTY;  Surgeon: Elna Haggis, MD;  Location: [facility];  Service: Neurosurgery;  Laterality: N/A;  C56 C67 artificial disc replacement   CESAREAN SECTION     COLONOSCOPY     DILATATION & CURETTAGE/HYSTEROSCOPY WITH TRUECLEAR N/A 11/25/2013   Procedure: DILATATION & CURETTAGE/HYSTEROSCOPY WITH TRUCLEAR;  Surgeon: Piedad Brewer, MD;  Location: [facility];  Service: Gynecology;  Laterality: N/A;   ECTOPIC PREGNANCY SURGERY     GALLBLADDER SURGERY     MOUTH SURGERY     child- dog bite   SPINAL FUSION     2023   STERIOD INJECTION Left 09/09/2022   Procedure: LEFT KNEE STEROID INJECTION;  Surgeon: Liliane Rei, MD;   Location: [facility];  Service: Orthopedics;  Laterality: Left;  left knee injection 0928   TONSILLECTOMY     TOTAL KNEE ARTHROPLASTY Right 09/09/2022   Procedure: TOTAL KNEE ARTHROPLASTY;  Surgeon: Liliane Rei, MD;  Location: [facility];  Service: Orthopedics;  Laterality: Right;   TUBAL LIGATION     Patient Active Problem List   Diagnosis Date Noted   OA (osteoarthritis) of knee 09/09/2022   Exertional dyspnea 04/17/2022   Elevated coronary artery calcium  score 04/17/2022   Spondylolisthesis at L4-L5 level 12/27/2021   Ductal carcinoma in situ (DCIS) of left breast 01/19/2018   Generalized anxiety disorder 11/07/2017   Depression 11/07/2017   Insomnia 11/07/2017   Cervical spondylosis 03/19/2013    PCP: Margarete Sharps  REFERRING PROVIDER: Athena Lazier  REFERRING DIAG: R TKA 09/09/22  THERAPY DIAG:  Chronic bilateral back pain, unspecified back location  S/P total knee arthroplasty, right  Stiffness of right knee, not elsewhere classified  Muscle weakness (generalized)  Acute pain of right knee  Localized edema  Other abnormalities of gait and mobility  Rationale for Evaluation and Treatment: Rehabilitation  ONSET DATE: 09/09/22  SUBJECTIVE:   SUBJECTIVE STATEMENT :  Doing okay, had car breakdown yesterday and had a lot of running around  a little stiff and sore  Saw Dr. Ellery Guthrie last Friday, has a bulging disc on the right at L2, recommended continuation of PT.  Will have injection in back May 20th R TKA 8/19 L knee steroid injection 09/09/22  PAIN:  Are you having pain? Yes: NPRS scale: 4/10 Pain location: right low back, buttock and reports around to the front of the thigh Pain description: sharp really hurts Aggravating factors: difficulty sleeping, difficulty walking and standing and sitting Relieving factors: heat, Aleve , tramadol  and methacarbomol  PRECAUTIONS: None  RED FLAGS: None   WEIGHT BEARING RESTRICTIONS: No  FALLS:  Has patient fallen in last 6  months? No  LIVING ENVIRONMENT: Lives with: lives with their family and lives with their spouse Lives in: House/apartment Stairs: Yes: External: 3 steps; none Has following equipment at home: Single point cane and Walker - 2 wheeled  OCCUPATION: Not working  PLOF: Independent  PATIENT GOALS: less back pain  NEXT MD VISIT: 09/24/22  OBJECTIVE:   PATIENT SURVEYS:  FOTO 4  COGNITION: Overall cognitive status: Within functional limits for tasks assessed      EDEMA:  Increased swelling surrounding R knee  POSTURE: rounded shoulders and flexed trunk   PALPATION: Warm and TTP  LOWER EXTREMITY ROM:  Active ROM Right eval Left eval Right 09/24/22 Right 09/26/22 Right 10/02/22  11/01/22 12/09/22  Hip flexion          Hip extension          Hip abduction          Hip adduction          Hip internal rotation          Hip external rotation          Knee flexion  60  75* AROM, 86 PROM 82* AROM seated per pt request  78* self AAROM, 76* AROM  PROM 87 AROM 81 PROM 90 AROM 83 AROM 101 PROM 105  Knee extension -30  -15* AROM seated, -15 PROM supine   -16* supine heel prop with quad set  AROM 25 PROM 16 AROM 14 AROM 5 PROM 0  Ankle dorsiflexion          Ankle plantarflexion          Ankle inversion          Ankle eversion           (Blank rows = not tested)  LOWER EXTREMITY MMT:  MMT Right eval Left eval  Hip flexion 2   Hip extension    Hip abduction    Hip adduction    Hip internal rotation    Hip external rotation    Knee flexion 2   Knee extension 2   Ankle dorsiflexion    Ankle plantarflexion    Ankle inversion    Ankle eversion     (Blank rows = not tested)  LUMBAR ROM:  decreased 50% flexion, decreased 75% for extension and side bending, pain was worse with extension and right side bending. Hooklying is less painful that lying flat on back Mild tightness with pain during HS stretch, tight and painful piriformis bilaterally worse on the right , very weak  core  FUNCTIONAL TESTS:  5 times sit to stand: 30 seconds 03/20/23 Timed up and go (TUG): 1 min 28s  GAIT: Is no without assistive device, mild antalgic on the right    TODAY'S TREATMENT:  DATE:  06/19/23 Gait around the back building Bike level 4 x 5 minutes Feet on ball K2C, rotation, small bridge, isometric abs Blue tband hip extension Blue tband clamshells Small bridges Passive stretch LE's Sheet lumbar traction  06/17/23 Bike level 4 x 6 minutes Back to wall small scapular touches Back to wall weighted ball overhead reach Small standing extension Feet on ball K2C, small rotation, posterior isometrics, ab isometrics Passive stretch LE's Ball b/n knees squeeze Green tband clamshells Blue tband supine hip extension  06/09/23 Bike level 5 x 6 minutes Gait outside around the back building Seated rows 20# Lats 20# Straight arm pulls 5# Feet on ball K2C, rotation, bridge, isometric abs Passive stretch LE's STM to the Low back  06/04/23 Bike Level 5 x 6 minutes Walk around the parking [place] on airex straight arm pulls 5# 15# seated rows Feet on ball K2C, rotation, small bridge, isometric abs Passive stretch LE's Manual sheet traction STM with the T-gun right low back and right buttock  06/02/23 Bike level 4 x 6 minutes Prone over two pillows x 2 minutes Then in road kill position with right leg ER and out for 2 minutes Passive stretch LE's STM to the right low back and right buttock Manual sheet traction  05/28/23 Bike level 4 x 6 minutes 5# straight arm pulls Feet on ball K2C, rotation, small bridge, isometric abs Passive stretch LE's Sheet traction STM to the right buttock and back  05/26/23 Bike level 5 x 6 minutes 5# straight arm pulls 20# rows 2x10 20# lats 2x10 5# AR press Passive stretch LE's STM to the right buttock, HS and  low back Manual sheet traction lumbar  05/22/23 PROM LE's STM to the buttocks, HS and Lumbar area into the thoracic area Manual sheet traction  05/19/23 Bike level 4 x 6 minutes 5# straight arm pulls Seated rows 20# Lats 20# 10# AR press some pain in leg so we stopped Feet on ball K2C, rotation, bridges, isometric abs Passive stretch, STM to the lumbar area left, sheet traction   05/15/23 Bike level 5 x 5 minutes 5# straight arm pulls cues for core activation Feet on ball K2C, rotation, small bridge, isometric abs Passive LE stretches STM to the right buttock and the bilateral lumbar paraspinals Manual sheet tractoin  05/13/23 Recheck goals for progress note  Gait around parking lot  NuStep L5x54mins  5# straight arm pulls 2x10 AR press 10# 2x10 Calf stretch 30s  Supine LE stretches- LTR, HS, SKTC, figure 4    05/07/23 Bike level 5 x 6 minutes 5# straight arm pulls 5 # hip extension some pain Feet on ball K2C, rotation, small bridge, isometric abs Manual sheet traction STM with the Tgun in the right low back and buttock  05/05/23 Bike level 5 x 6 minutes Gait outside around the parking [place] 5# straight arm pulls 15# lats Feet on ball K2C, rotation, bridges, isometric abs Manual sheet traction STM to the right low back and buttock  05/01/23 Bike level 4 x  minute 5# straight arm pulls 5# AR press Hip extension no weight Hip abduction no weight Feet on ball K2C, trunk rotation, posterior isometrics and isometric abs STM to back and hips Manual sheet traction gently  04/28/23 Bike level 4 x 5 minutes with towel roll behind back 5# straight arm pulls cues for posture and core activation Feet on ball K2C, rotation, bridge, isometric abs Passive LE stretches STM to the right low back and buttock Tgun to  above Manual sheet traction  04/24/23 Gait walking around the back building, she reported no increase of LBP but was already pain at a 7/10, she reports that  she did not have to push on her back with her hands Feet on ball K2C, rotation, small bridge, isometric abs Passive LE stretches Passive gentle sheet lumbar traction STM with the T-gun to the lumbar area and the glutes  04/21/23 Gait outside around the parking [employer] level 4 x 5 minutes 5# straight arm pulls mild increase of LBP Feet on ball K2C, rotation, smaal posterior activation , isometric abs Passive LE stretches STM with the T-gun to the right low back and buttock on the right Manual pelvic traction  04/17/23 Gait outside descent pace, this does increase pain Bike level 3 x 5 minutes PROM LE's STM with the T-gun to the low back and buttock Gentle manual sheet traction  04/15/23 Nustep level 5 x 6 minutes Bike level 3 x 5 minutes Leg press 20# and no weight  5# straight arm pulls STM to the right low back and buttock Gentle manual pelvic traction with sheet  04/10/23 Sit ft pelvic ROM 4 way s 15 x each Nustep L 5 Red tband seated trunk ext 2 sets 10,10 x each trunk rotation Iso abs with ball 10 x hold 3 sec Feet on ball bridge, KTC and obl ADD ball squeeze Clams and hip flex red tband 2 sets 10 Passive stretch to the LE's Very gentle manual sheet traction lumbar   04/08/23 STM with the Tgun to the right low back and right buttock Feet on ball K2C, small rotation, small bridge and isometric abs Passive stretch to the LE's Very gentle manual sheet traction lumbar  MHP/IFC to the right low back in supine feet elevated  04/03/23 Nustep L 5 6 min Sitting on dyna disc pelvic ROM 10x for pelvic ROM and stab Sitting on dyna disc for stab LAQ,hip flex,hip abd 10 x each Sitting on dyna disc shld row and ext red tband 2 sets 10 Supine LE stretches- HS, LTR, SKTC, piriformis, glutes RT LE NT stretching-positive MH/IFC RT LB supine   04/01/23 Walk outdoors  NuStep L5x17mins  Seated row 20# 2x10 Lat pull down 20# 2x10 BlackTB ext 2x10 Supine LE stretches- HS,  LTR, SKTC, piriformis, glutes    03/26/23 NuStep L5 x 6 min Bridges LE on pball bridges x5, K2C x10, Oblq   Passive stretching to HS, piriformis, ITB, Glute Shoulder Ext 5lb x10 Standing rows 5lb x12  03/20/23 Evaluation of the lumbar spine order from Dr. Ellery Guthrie  PATIENT EDUCATION:  Education details: POC and HEP Person educated: Patient Education method: Explanation Education comprehension: verbalized understanding  HOME EXERCISE PROGRAM: Access Code: [facility] Access Code: [email or web address] URL: [email or web address] Date: 03/20/2023 Prepared by: Cherylene Corrente  Exercises - Hooklying Single Knee to Chest Stretch  - 2 x daily - 7 x weekly - 1 sets - 10 reps - 5 hold - Supine Double Knee to Chest  - 2 x daily - 7 x weekly - 1 sets - 10 reps - 5 hold - Supine Lower Trunk Rotation with PLB  - 2 x daily - 7 x weekly - 1 sets - 10 reps - 5 hold - Supine Posterior Pelvic Tilt  - 2 x daily - 7 x weekly - 1 sets - 10 reps - 3 hold  ASSESSMENT:  CLINICAL IMPRESSION:   there is a right disc herniation at L2-3.  She had an injection  in the back last week it has helped some bring the pain down from 6 to a 4/10, Patient reports that she tried prone over pillows and it went well.  We returned to the traction to see if this would help as well OBJECTIVE IMPAIRMENTS: Abnormal gait, cardiopulmonary status limiting activity, decreased activity tolerance, decreased endurance, decreased mobility, difficulty walking, decreased ROM, decreased strength, increased muscle spasms, impaired flexibility, improper body mechanics, postural dysfunction, and pain.   REHAB POTENTIAL: Good  CLINICAL DECISION MAKING: Stable/uncomplicated  EVALUATION COMPLEXITY: Low   GOALS: Goals reviewed with patient? Yes  SHORT TERM GOALS: Target date: 10/24/22  Patient will be independent with initial HEP. Goal status: 09/20/22 met  2.  Patient will be do TUG < 30s Baseline: 1 min 28s  Goal status: ongoing  10/07/22, 18.57s MET 10/09/22  3.  Patient will be able to do 5 sit to stands  Baseline: unable to do  Goal status: progressing 10/04/22, MET 10/09/22   LONG TERM GOALS: Target date: 05/18/23  Patient will be independent with advanced/ongoing HEP to improve outcomes and carryover.  Goal status: ongoing 11/06/22  2.  Patient will report at least 75% improvement in R knee pain to improve QOL. Baseline: 10/10 Goal status: met 02/03/23 60% better 11/20/22, 65% 12/06/22, 70% 12/17/22  3.  Patient will demonstrate improved R knee AROM to >/= 0-120 deg to allow for normal gait and stair mechanics. Baseline: 30-0-60 10/09/22: 16-0-78  11/01/22: 14-0-83 11/20/22: 10-97 12/06/22: 10-0-100 12/17/22: 8-0-104 05/13/23: 5-0-115 Goal status: continue to progress and getting closer 0-112  03/13/23. Met 06/19/23  4.  Patient will demonstrate improved functional LE strength as demonstrated by 5/5. Baseline: 2/5 Goal status: MET 3+/5 10/09/22, 5/5 11/01/22   5.  Patient will be able to ambulate 500' with LRAD and normal gait pattern without increased pain to access community.  Baseline: using RW small in home ambulation distances Goal status: progressing w/SPC 12/02/22, doing some walking without AD, MET 05/13/23  6.  Patient will report 52 on FOTO (patient reported outcome measure) to demonstrate improved functional ability. Baseline: 4, 40-11/11/24 Goal status: MET 53 12/17/22  7.  Decrease lumbar pain 50%  Goal status: ongoing 05/01/23, 45% 05/13/23  8.  Increase lumbar ROM 25%  Goal status: ongoing 04/24/23, ongoing 05/13/23-- extension, L side bending, and R rotation is very limited and painful   9.  Report able to put shoes and socks on without pain > 4/10  Goal status: ongoing still pain 7/10 05/01/23, MET 05/13/23  PLAN:  PT FREQUENCY: 2x/week  PT DURATION: 12 weeks  PLANNED INTERVENTIONS: Therapeutic exercises, Therapeutic activity, Neuromuscular re-education, Balance training, Gait  training, Patient/Family education, Self Care, Joint mobilization, Stair training, Electrical stimulation, Cryotherapy, Moist heat, scar mobilization, Vasopneumatic device, and Manual therapy  PLAN FOR NEXT SESSION: may add prone over pillows, working to prone and road kill positions   Patient Details  Name: [name] MRN: [id number] Date of Birth: 04/20/1960 Referring Provider:  Margarete Sharps, MD  Encounter Date: 06/19/2023   Hollis Lurie, PT 06/19/2023, 1:27 PM

## 2023-06-20 ENCOUNTER — Ambulatory Visit: Admitting: Professional Counselor

## 2023-06-23 ENCOUNTER — Ambulatory Visit: Admitting: Professional Counselor

## 2023-06-24 ENCOUNTER — Ambulatory Visit: Attending: Neurological Surgery | Admitting: Physical Therapy

## 2023-06-24 ENCOUNTER — Encounter: Payer: Self-pay | Admitting: Physical Therapy

## 2023-06-24 DIAGNOSIS — M6281 Muscle weakness (generalized): Secondary | ICD-10-CM | POA: Diagnosis present

## 2023-06-24 DIAGNOSIS — R600 Localized edema: Secondary | ICD-10-CM | POA: Diagnosis present

## 2023-06-24 DIAGNOSIS — G8929 Other chronic pain: Secondary | ICD-10-CM | POA: Diagnosis present

## 2023-06-24 DIAGNOSIS — M25561 Pain in right knee: Secondary | ICD-10-CM | POA: Insufficient documentation

## 2023-06-24 DIAGNOSIS — M25661 Stiffness of right knee, not elsewhere classified: Secondary | ICD-10-CM | POA: Insufficient documentation

## 2023-06-24 DIAGNOSIS — M549 Dorsalgia, unspecified: Secondary | ICD-10-CM | POA: Diagnosis present

## 2023-06-24 DIAGNOSIS — Z96651 Presence of right artificial knee joint: Secondary | ICD-10-CM | POA: Diagnosis present

## 2023-06-24 NOTE — Therapy (Signed)
 OUTPATIENT PHYSICAL THERAPY LOWER EXTREMITY TREATMENT    Patient Name: [name] MRN: [id number] DOB:1960/11/22, 63 y.o., female Today's Date: 06/24/2023  END OF SESSION:  PT End of Session - 06/24/23 1454     Visit Number 91    Date for PT Re-Evaluation 07/19/23    Authorization Type [employer]    PT Start Time 1445    PT Stop Time 1530    PT Time Calculation (min) 45 min    Activity Tolerance Patient tolerated treatment well    Behavior During Therapy WFL for tasks assessed/performed               Past Medical History:  Diagnosis Date   Arthritis    Back pain of lumbar region with sciatica    Cancer (HCC)    breast   Depression    Diabetes mellitus, type II (HCC)    Family history of anesthesia complication    grandfather died under anesthesia 70's- had heart issues   H/O carpal tunnel repair 2014   Headache(784.0)    Hyperlipidemia    Hypertension    Hypothyroidism    Medial meniscus tear    PONV (postoperative nausea and vomiting)    Sleep apnea    cpap since 10   Past Surgical History:  Procedure Laterality Date   BILATERAL TOTAL MASTECTOMY WITH AXILLARY LYMPH NODE DISSECTION     BREAST RECONSTRUCTION     CERVICAL DISC ARTHROPLASTY N/A 03/19/2013   Procedure: CERVICAL FIVE TO SIX, CERVICAL SIX TO SEVEN CERVICAL ANTERIOR DISC ARTHROPLASTY;  Surgeon: Elna Haggis, MD;  Location: [facility];  Service: Neurosurgery;  Laterality: N/A;  C56 C67 artificial disc replacement   CESAREAN SECTION     COLONOSCOPY     DILATATION & CURETTAGE/HYSTEROSCOPY WITH TRUECLEAR N/A 11/25/2013   Procedure: DILATATION & CURETTAGE/HYSTEROSCOPY WITH TRUCLEAR;  Surgeon: Piedad Brewer, MD;  Location: [facility];  Service: Gynecology;  Laterality: N/A;   ECTOPIC PREGNANCY SURGERY     GALLBLADDER SURGERY     MOUTH SURGERY     child- dog bite   SPINAL FUSION     2023   STERIOD INJECTION Left 09/09/2022   Procedure: LEFT KNEE STEROID INJECTION;  Surgeon: Liliane Rei, MD;   Location: [facility];  Service: Orthopedics;  Laterality: Left;  left knee injection 0928   TONSILLECTOMY     TOTAL KNEE ARTHROPLASTY Right 09/09/2022   Procedure: TOTAL KNEE ARTHROPLASTY;  Surgeon: Liliane Rei, MD;  Location: [facility];  Service: Orthopedics;  Laterality: Right;   TUBAL LIGATION     Patient Active Problem List   Diagnosis Date Noted   OA (osteoarthritis) of knee 09/09/2022   Exertional dyspnea 04/17/2022   Elevated coronary artery calcium  score 04/17/2022   Spondylolisthesis at L4-L5 level 12/27/2021   Ductal carcinoma in situ (DCIS) of left breast 01/19/2018   Generalized anxiety disorder 11/07/2017   Depression 11/07/2017   Insomnia 11/07/2017   Cervical spondylosis 03/19/2013    PCP: Margarete Sharps  REFERRING PROVIDER: Athena Lazier  REFERRING DIAG: R TKA 09/09/22  THERAPY DIAG:  Chronic bilateral back pain, unspecified back location  S/P total knee arthroplasty, right  Stiffness of right knee, not elsewhere classified  Muscle weakness (generalized)  Rationale for Evaluation and Treatment: Rehabilitation  ONSET DATE: 09/09/22  SUBJECTIVE:   SUBJECTIVE STATEMENT :  Doing okay, my surgery is scheduled for 07/17/23  Saw Dr. Ellery Guthrie last Friday, has a bulging disc on the right at L2, recommended continuation of PT.  Will  have injection in back May 20th R TKA 8/19 L knee steroid injection 09/09/22  PAIN:  Are you having pain? Yes: NPRS scale: 4/10 Pain location: right low back, buttock and reports around to the front of the thigh Pain description: sharp really hurts Aggravating factors: difficulty sleeping, difficulty walking and standing and sitting Relieving factors: heat, Aleve , tramadol  and methacarbomol  PRECAUTIONS: None  RED FLAGS: None   WEIGHT BEARING RESTRICTIONS: No  FALLS:  Has patient fallen in last 6 months? No  LIVING ENVIRONMENT: Lives with: lives with their family and lives with their spouse Lives in: House/apartment Stairs: Yes:  External: 3 steps; none Has following equipment at home: Single point cane and Walker - 2 wheeled  OCCUPATION: Not working  PLOF: Independent  PATIENT GOALS: less back pain  NEXT MD VISIT: 09/24/22  OBJECTIVE:   PATIENT SURVEYS:  FOTO 4  COGNITION: Overall cognitive status: Within functional limits for tasks assessed      EDEMA:  Increased swelling surrounding R knee  POSTURE: rounded shoulders and flexed trunk   PALPATION: Warm and TTP  LOWER EXTREMITY ROM:  Active ROM Right eval Left eval Right 09/24/22 Right 09/26/22 Right 10/02/22  11/01/22 12/09/22  Hip flexion          Hip extension          Hip abduction          Hip adduction          Hip internal rotation          Hip external rotation          Knee flexion  60  75* AROM, 86 PROM 82* AROM seated per pt request  78* self AAROM, 76* AROM  PROM 87 AROM 81 PROM 90 AROM 83 AROM 101 PROM 105  Knee extension -30  -15* AROM seated, -15 PROM supine   -16* supine heel prop with quad set  AROM 25 PROM 16 AROM 14 AROM 5 PROM 0  Ankle dorsiflexion          Ankle plantarflexion          Ankle inversion          Ankle eversion           (Blank rows = not tested)  LOWER EXTREMITY MMT:  MMT Right eval Left eval  Hip flexion 2   Hip extension    Hip abduction    Hip adduction    Hip internal rotation    Hip external rotation    Knee flexion 2   Knee extension 2   Ankle dorsiflexion    Ankle plantarflexion    Ankle inversion    Ankle eversion     (Blank rows = not tested)  LUMBAR ROM:  decreased 50% flexion, decreased 75% for extension and side bending, pain was worse with extension and right side bending. Hooklying is less painful that lying flat on back Mild tightness with pain during HS stretch, tight and painful piriformis bilaterally worse on the right , very weak core  FUNCTIONAL TESTS:  5 times sit to stand: 30 seconds 03/20/23 Timed up and go (TUG): 1 min 28s  GAIT: Is no without assistive device,  mild antalgic on the right    TODAY'S TREATMENT:  DATE:  06/24/23 Gait around the back building  Bike level 4 x 5 minutes Feet on ball, K2C, rotation, small bridge, isometric abs Passive stretch LE STM to the low back and right buttock Manual pelvic traction  06/19/23 Gait around the back building Bike level 4 x 5 minutes Feet on ball K2C, rotation, small bridge, isometric abs Blue tband hip extension Blue tband clamshells Small bridges Passive stretch LE's Sheet lumbar traction  06/17/23 Bike level 4 x 6 minutes Back to wall small scapular touches Back to wall weighted ball overhead reach Small standing extension Feet on ball K2C, small rotation, posterior isometrics, ab isometrics Passive stretch LE's Ball b/n knees squeeze Green tband clamshells Blue tband supine hip extension  06/09/23 Bike level 5 x 6 minutes Gait outside around the back building Seated rows 20# Lats 20# Straight arm pulls 5# Feet on ball K2C, rotation, bridge, isometric abs Passive stretch LE's STM to the Low back  06/04/23 Bike Level 5 x 6 minutes Walk around the parking [place] on airex straight arm pulls 5# 15# seated rows Feet on ball K2C, rotation, small bridge, isometric abs Passive stretch LE's Manual sheet traction STM with the T-gun right low back and right buttock  06/02/23 Bike level 4 x 6 minutes Prone over two pillows x 2 minutes Then in road kill position with right leg ER and out for 2 minutes Passive stretch LE's STM to the right low back and right buttock Manual sheet traction  05/28/23 Bike level 4 x 6 minutes 5# straight arm pulls Feet on ball K2C, rotation, small bridge, isometric abs Passive stretch LE's Sheet traction STM to the right buttock and back  05/26/23 Bike level 5 x 6 minutes 5# straight arm pulls 20# rows 2x10 20# lats 2x10 5#  AR press Passive stretch LE's STM to the right buttock, HS and low back Manual sheet traction lumbar  05/22/23 PROM LE's STM to the buttocks, HS and Lumbar area into the thoracic area Manual sheet traction  05/19/23 Bike level 4 x 6 minutes 5# straight arm pulls Seated rows 20# Lats 20# 10# AR press some pain in leg so we stopped Feet on ball K2C, rotation, bridges, isometric abs Passive stretch, STM to the lumbar area left, sheet traction   05/15/23 Bike level 5 x 5 minutes 5# straight arm pulls cues for core activation Feet on ball K2C, rotation, small bridge, isometric abs Passive LE stretches STM to the right buttock and the bilateral lumbar paraspinals Manual sheet tractoin  05/13/23 Recheck goals for progress note  Gait around parking lot  NuStep L5x6mins  5# straight arm pulls 2x10 AR press 10# 2x10 Calf stretch 30s  Supine LE stretches- LTR, HS, SKTC, figure 4    05/07/23 Bike level 5 x 6 minutes 5# straight arm pulls 5 # hip extension some pain Feet on ball K2C, rotation, small bridge, isometric abs Manual sheet traction STM with the Tgun in the right low back and buttock  05/05/23 Bike level 5 x 6 minutes Gait outside around the parking [place] 5# straight arm pulls 15# lats Feet on ball K2C, rotation, bridges, isometric abs Manual sheet traction STM to the right low back and buttock  05/01/23 Bike level 4 x  minute 5# straight arm pulls 5# AR press Hip extension no weight Hip abduction no weight Feet on ball K2C, trunk rotation, posterior isometrics and isometric abs STM to back and hips Manual sheet traction gently  04/28/23 Bike level 4 x 5  minutes with towel roll behind back 5# straight arm pulls cues for posture and core activation Feet on ball K2C, rotation, bridge, isometric abs Passive LE stretches STM to the right low back and buttock Tgun to above Manual sheet traction  04/24/23 Gait walking around the back building, she reported no  increase of LBP but was already pain at a 7/10, she reports that she did not have to push on her back with her hands Feet on ball K2C, rotation, small bridge, isometric abs Passive LE stretches Passive gentle sheet lumbar traction STM with the T-gun to the lumbar area and the glutes  04/21/23 Gait outside around the parking [employer] level 4 x 5 minutes 5# straight arm pulls mild increase of LBP Feet on ball K2C, rotation, smaal posterior activation , isometric abs Passive LE stretches STM with the T-gun to the right low back and buttock on the right Manual pelvic traction  04/17/23 Gait outside descent pace, this does increase pain Bike level 3 x 5 minutes PROM LE's STM with the T-gun to the low back and buttock Gentle manual sheet traction  04/15/23 Nustep level 5 x 6 minutes Bike level 3 x 5 minutes Leg press 20# and no weight  5# straight arm pulls STM to the right low back and buttock Gentle manual pelvic traction with sheet  04/10/23 Sit ft pelvic ROM 4 way s 15 x each Nustep L 5 Red tband seated trunk ext 2 sets 10,10 x each trunk rotation Iso abs with ball 10 x hold 3 sec Feet on ball bridge, KTC and obl ADD ball squeeze Clams and hip flex red tband 2 sets 10 Passive stretch to the LE's Very gentle manual sheet traction lumbar   04/08/23 STM with the Tgun to the right low back and right buttock Feet on ball K2C, small rotation, small bridge and isometric abs Passive stretch to the LE's Very gentle manual sheet traction lumbar  MHP/IFC to the right low back in supine feet elevated  04/03/23 Nustep L 5 6 min Sitting on dyna disc pelvic ROM 10x for pelvic ROM and stab Sitting on dyna disc for stab LAQ,hip flex,hip abd 10 x each Sitting on dyna disc shld row and ext red tband 2 sets 10 Supine LE stretches- HS, LTR, SKTC, piriformis, glutes RT LE NT stretching-positive MH/IFC RT LB supine   04/01/23 Walk outdoors  NuStep L5x40mins  Seated row 20#  2x10 Lat pull down 20# 2x10 BlackTB ext 2x10 Supine LE stretches- HS, LTR, SKTC, piriformis, glutes    03/26/23 NuStep L5 x 6 min Bridges LE on pball bridges x5, K2C x10, Oblq   Passive stretching to HS, piriformis, ITB, Glute Shoulder Ext 5lb x10 Standing rows 5lb x12  03/20/23 Evaluation of the lumbar spine order from Dr. Ellery Guthrie  PATIENT EDUCATION:  Education details: POC and HEP Person educated: Patient Education method: Explanation Education comprehension: verbalized understanding  HOME EXERCISE PROGRAM: Access Code: [facility] Access Code: [email or web address] URL: [email or web address] Date: 03/20/2023 Prepared by: Cherylene Corrente  Exercises - Hooklying Single Knee to Chest Stretch  - 2 x daily - 7 x weekly - 1 sets - 10 reps - 5 hold - Supine Double Knee to Chest  - 2 x daily - 7 x weekly - 1 sets - 10 reps - 5 hold - Supine Lower Trunk Rotation with PLB  - 2 x daily - 7 x weekly - 1 sets - 10 reps - 5 hold - Supine Posterior Pelvic Tilt  -  2 x daily - 7 x weekly - 1 sets - 10 reps - 3 hold  ASSESSMENT:  CLINICAL IMPRESSION:   there is a right disc herniation at L2-3.  We continue to work on light function and pain relief, she is reporting less pain and able to do more with less pain overall, she reports that she will have breast surgery in 3 weeks and will have to stop PT, wants to be strong and good by that time OBJECTIVE IMPAIRMENTS: Abnormal gait, cardiopulmonary status limiting activity, decreased activity tolerance, decreased endurance, decreased mobility, difficulty walking, decreased ROM, decreased strength, increased muscle spasms, impaired flexibility, improper body mechanics, postural dysfunction, and pain.   REHAB POTENTIAL: Good  CLINICAL DECISION MAKING: Stable/uncomplicated  EVALUATION COMPLEXITY: Low   GOALS: Goals reviewed with patient? Yes  SHORT TERM GOALS: Target date: 10/24/22  Patient will be independent with initial HEP. Goal status:  09/20/22 met  2.  Patient will be do TUG < 30s Baseline: 1 min 28s  Goal status: ongoing 10/07/22, 18.57s MET 10/09/22  3.  Patient will be able to do 5 sit to stands  Baseline: unable to do  Goal status: progressing 10/04/22, MET 10/09/22   LONG TERM GOALS: Target date: 05/18/23  Patient will be independent with advanced/ongoing HEP to improve outcomes and carryover.  Goal status: ongoing 11/06/22  2.  Patient will report at least 75% improvement in R knee pain to improve QOL. Baseline: 10/10 Goal status: met 02/03/23 60% better 11/20/22, 65% 12/06/22, 70% 12/17/22  3.  Patient will demonstrate improved R knee AROM to >/= 0-120 deg to allow for normal gait and stair mechanics. Baseline: 30-0-60 10/09/22: 16-0-78  11/01/22: 14-0-83 11/20/22: 10-97 12/06/22: 10-0-100 12/17/22: 8-0-104 05/13/23: 5-0-115 Goal status: continue to progress and getting closer 0-112  03/13/23. Met 06/19/23  4.  Patient will demonstrate improved functional LE strength as demonstrated by 5/5. Baseline: 2/5 Goal status: MET 3+/5 10/09/22, 5/5 11/01/22   5.  Patient will be able to ambulate 500' with LRAD and normal gait pattern without increased pain to access community.  Baseline: using RW small in home ambulation distances Goal status: progressing w/SPC 12/02/22, doing some walking without AD, MET 05/13/23  6.  Patient will report 58 on FOTO (patient reported outcome measure) to demonstrate improved functional ability. Baseline: 4, 40-11/11/24 Goal status: MET 53 12/17/22  7.  Decrease lumbar pain 50%  Goal status: ongoing 05/01/23, 45% 05/13/23  8.  Increase lumbar ROM 25%  Goal status: ongoing 04/24/23, ongoing 05/13/23-- extension, L side bending, and R rotation is very limited and painful   9.  Report able to put shoes and socks on without pain > 4/10  Goal status: ongoing still pain 7/10 05/01/23, MET 05/13/23  PLAN:  PT FREQUENCY: 2x/week  PT DURATION: 12 weeks  PLANNED INTERVENTIONS: Therapeutic  exercises, Therapeutic activity, Neuromuscular re-education, Balance training, Gait training, Patient/Family education, Self Care, Joint mobilization, Stair training, Electrical stimulation, Cryotherapy, Moist heat, scar mobilization, Vasopneumatic device, and Manual therapy  PLAN FOR NEXT SESSION: Will continue to add some core stability and overall strength to prepare her for her surgery in 3 weeks   Patient Details  Name: [name] MRN: [id number] Date of Birth: Feb 16, 1960 Referring Provider:  Elna Haggis, MD  Encounter Date: 06/24/2023   Hollis Lurie, PT 06/24/2023, 2:54 PM

## 2023-06-26 ENCOUNTER — Ambulatory Visit: Admitting: Physical Therapy

## 2023-06-30 ENCOUNTER — Ambulatory Visit: Admitting: Physical Therapy

## 2023-06-30 ENCOUNTER — Encounter: Payer: Self-pay | Admitting: Physical Therapy

## 2023-06-30 DIAGNOSIS — M549 Dorsalgia, unspecified: Secondary | ICD-10-CM | POA: Diagnosis not present

## 2023-06-30 DIAGNOSIS — M25661 Stiffness of right knee, not elsewhere classified: Secondary | ICD-10-CM

## 2023-06-30 DIAGNOSIS — Z96651 Presence of right artificial knee joint: Secondary | ICD-10-CM

## 2023-06-30 DIAGNOSIS — M25561 Pain in right knee: Secondary | ICD-10-CM

## 2023-06-30 DIAGNOSIS — R600 Localized edema: Secondary | ICD-10-CM

## 2023-06-30 DIAGNOSIS — G8929 Other chronic pain: Secondary | ICD-10-CM

## 2023-06-30 DIAGNOSIS — M6281 Muscle weakness (generalized): Secondary | ICD-10-CM

## 2023-06-30 NOTE — Therapy (Signed)
 OUTPATIENT PHYSICAL THERAPY LOWER EXTREMITY TREATMENT    Patient Name: [name] MRN: [id number] DOB:03-Jan-1961, 63 y.o., female Today's Date: 06/30/2023  END OF SESSION:  PT End of Session - 06/30/23 1407     Visit Number 92    Date for PT Re-Evaluation 07/19/23    Authorization Type [employer]    PT Start Time 1400    PT Stop Time 1445    PT Time Calculation (min) 45 min    Activity Tolerance Patient tolerated treatment well    Behavior During Therapy WFL for tasks assessed/performed               Past Medical History:  Diagnosis Date   Arthritis    Back pain of lumbar region with sciatica    Cancer (HCC)    breast   Depression    Diabetes mellitus, type II (HCC)    Family history of anesthesia complication    grandfather died under anesthesia 70's- had heart issues   H/O carpal tunnel repair 2014   Headache(784.0)    Hyperlipidemia    Hypertension    Hypothyroidism    Medial meniscus tear    PONV (postoperative nausea and vomiting)    Sleep apnea    cpap since 10   Past Surgical History:  Procedure Laterality Date   BILATERAL TOTAL MASTECTOMY WITH AXILLARY LYMPH NODE DISSECTION     BREAST RECONSTRUCTION     CERVICAL DISC ARTHROPLASTY N/A 03/19/2013   Procedure: CERVICAL FIVE TO SIX, CERVICAL SIX TO SEVEN CERVICAL ANTERIOR DISC ARTHROPLASTY;  Surgeon: Elna Haggis, MD;  Location: [facility];  Service: Neurosurgery;  Laterality: N/A;  C56 C67 artificial disc replacement   CESAREAN SECTION     COLONOSCOPY     DILATATION & CURETTAGE/HYSTEROSCOPY WITH TRUECLEAR N/A 11/25/2013   Procedure: DILATATION & CURETTAGE/HYSTEROSCOPY WITH TRUCLEAR;  Surgeon: Piedad Brewer, MD;  Location: [facility];  Service: Gynecology;  Laterality: N/A;   ECTOPIC PREGNANCY SURGERY     GALLBLADDER SURGERY     MOUTH SURGERY     child- dog bite   SPINAL FUSION     2023   STERIOD INJECTION Left 09/09/2022   Procedure: LEFT KNEE STEROID INJECTION;  Surgeon: Liliane Rei, MD;   Location: [facility];  Service: Orthopedics;  Laterality: Left;  left knee injection 0928   TONSILLECTOMY     TOTAL KNEE ARTHROPLASTY Right 09/09/2022   Procedure: TOTAL KNEE ARTHROPLASTY;  Surgeon: Liliane Rei, MD;  Location: [facility];  Service: Orthopedics;  Laterality: Right;   TUBAL LIGATION     Patient Active Problem List   Diagnosis Date Noted   OA (osteoarthritis) of knee 09/09/2022   Exertional dyspnea 04/17/2022   Elevated coronary artery calcium  score 04/17/2022   Spondylolisthesis at L4-L5 level 12/27/2021   Ductal carcinoma in situ (DCIS) of left breast 01/19/2018   Generalized anxiety disorder 11/07/2017   Depression 11/07/2017   Insomnia 11/07/2017   Cervical spondylosis 03/19/2013    PCP: Margarete Sharps  REFERRING PROVIDER: Athena Lazier  REFERRING DIAG: R TKA 09/09/22  THERAPY DIAG:  Chronic bilateral back pain, unspecified back location  S/P total knee arthroplasty, right  Stiffness of right knee, not elsewhere classified  Muscle weakness (generalized)  Acute pain of right knee  Localized edema  Rationale for Evaluation and Treatment: Rehabilitation  ONSET DATE: 09/09/22  SUBJECTIVE:   SUBJECTIVE STATEMENT :  A little run down, travelled to my son's house.  Back is still hurting some but slightly better my surgery  is scheduled for 07/17/23  Saw Dr. Ellery Guthrie last Friday, has a bulging disc on the right at L2, recommended continuation of PT.  Will have injection in back May 20th R TKA 8/19 L knee steroid injection 09/09/22  PAIN:  Are you having pain? Yes: NPRS scale: 4/10 Pain location: right low back, buttock and reports around to the front of the thigh Pain description: sharp really hurts Aggravating factors: difficulty sleeping, difficulty walking and standing and sitting Relieving factors: heat, Aleve , tramadol  and methacarbomol  PRECAUTIONS: None  RED FLAGS: None   WEIGHT BEARING RESTRICTIONS: No  FALLS:  Has patient fallen in last 6 months?  No  LIVING ENVIRONMENT: Lives with: lives with their family and lives with their spouse Lives in: House/apartment Stairs: Yes: External: 3 steps; none Has following equipment at home: Single point cane and Walker - 2 wheeled  OCCUPATION: Not working  PLOF: Independent  PATIENT GOALS: less back pain  NEXT MD VISIT: 09/24/22  OBJECTIVE:   PATIENT SURVEYS:  FOTO 4  COGNITION: Overall cognitive status: Within functional limits for tasks assessed      EDEMA:  Increased swelling surrounding R knee  POSTURE: rounded shoulders and flexed trunk   PALPATION: Warm and TTP  LOWER EXTREMITY ROM:  Active ROM Right eval Left eval Right 09/24/22 Right 09/26/22 Right 10/02/22  11/01/22 12/09/22  Hip flexion          Hip extension          Hip abduction          Hip adduction          Hip internal rotation          Hip external rotation          Knee flexion  60  75* AROM, 86 PROM 82* AROM seated per pt request  78* self AAROM, 76* AROM  PROM 87 AROM 81 PROM 90 AROM 83 AROM 101 PROM 105  Knee extension -30  -15* AROM seated, -15 PROM supine   -16* supine heel prop with quad set  AROM 25 PROM 16 AROM 14 AROM 5 PROM 0  Ankle dorsiflexion          Ankle plantarflexion          Ankle inversion          Ankle eversion           (Blank rows = not tested)  LOWER EXTREMITY MMT:  MMT Right eval Left eval  Hip flexion 2   Hip extension    Hip abduction    Hip adduction    Hip internal rotation    Hip external rotation    Knee flexion 2   Knee extension 2   Ankle dorsiflexion    Ankle plantarflexion    Ankle inversion    Ankle eversion     (Blank rows = not tested)  LUMBAR ROM:  decreased 50% flexion, decreased 75% for extension and side bending, pain was worse with extension and right side bending. Hooklying is less painful that lying flat on back Mild tightness with pain during HS stretch, tight and painful piriformis bilaterally worse on the right , very weak  core  FUNCTIONAL TESTS:  5 times sit to stand: 30 seconds 03/20/23 Timed up and go (TUG): 1 min 28s  GAIT: Is no without assistive device, mild antalgic on the right    TODAY'S TREATMENT:  DATE:  06/30/23 Bike level 4 x 6 minutes 5# straight arm pulls 5# rows 15# lats Feet on ball K2C, rotation, small bridge, isometric abs Passive LE stretch Manual sheet traction  06/24/23 Gait around the back building  Bike level 4 x 5 minutes Feet on ball, K2C, rotation, small bridge, isometric abs Passive stretch LE STM to the low back and right buttock Manual pelvic traction  06/19/23 Gait around the back building Bike level 4 x 5 minutes Feet on ball K2C, rotation, small bridge, isometric abs Blue tband hip extension Blue tband clamshells Small bridges Passive stretch LE's Sheet lumbar traction  06/17/23 Bike level 4 x 6 minutes Back to wall small scapular touches Back to wall weighted ball overhead reach Small standing extension Feet on ball K2C, small rotation, posterior isometrics, ab isometrics Passive stretch LE's Ball b/n knees squeeze Green tband clamshells Blue tband supine hip extension  06/09/23 Bike level 5 x 6 minutes Gait outside around the back building Seated rows 20# Lats 20# Straight arm pulls 5# Feet on ball K2C, rotation, bridge, isometric abs Passive stretch LE's STM to the Low back  06/04/23 Bike Level 5 x 6 minutes Walk around the parking [place] on airex straight arm pulls 5# 15# seated rows Feet on ball K2C, rotation, small bridge, isometric abs Passive stretch LE's Manual sheet traction STM with the T-gun right low back and right buttock  06/02/23 Bike level 4 x 6 minutes Prone over two pillows x 2 minutes Then in road kill position with right leg ER and out for 2 minutes Passive stretch LE's STM to the right low back  and right buttock Manual sheet traction  05/28/23 Bike level 4 x 6 minutes 5# straight arm pulls Feet on ball K2C, rotation, small bridge, isometric abs Passive stretch LE's Sheet traction STM to the right buttock and back  05/26/23 Bike level 5 x 6 minutes 5# straight arm pulls 20# rows 2x10 20# lats 2x10 5# AR press Passive stretch LE's STM to the right buttock, HS and low back Manual sheet traction lumbar  05/22/23 PROM LE's STM to the buttocks, HS and Lumbar area into the thoracic area Manual sheet traction  05/19/23 Bike level 4 x 6 minutes 5# straight arm pulls Seated rows 20# Lats 20# 10# AR press some pain in leg so we stopped Feet on ball K2C, rotation, bridges, isometric abs Passive stretch, STM to the lumbar area left, sheet traction   05/15/23 Bike level 5 x 5 minutes 5# straight arm pulls cues for core activation Feet on ball K2C, rotation, small bridge, isometric abs Passive LE stretches STM to the right buttock and the bilateral lumbar paraspinals Manual sheet tractoin  05/13/23 Recheck goals for progress note  Gait around parking lot  NuStep L5x74mins  5# straight arm pulls 2x10 AR press 10# 2x10 Calf stretch 30s  Supine LE stretches- LTR, HS, SKTC, figure 4    05/07/23 Bike level 5 x 6 minutes 5# straight arm pulls 5 # hip extension some pain Feet on ball K2C, rotation, small bridge, isometric abs Manual sheet traction STM with the Tgun in the right low back and buttock  05/05/23 Bike level 5 x 6 minutes Gait outside around the parking [place] 5# straight arm pulls 15# lats Feet on ball K2C, rotation, bridges, isometric abs Manual sheet traction STM to the right low back and buttock  05/01/23 Bike level 4 x  minute 5# straight arm pulls 5# AR press Hip extension no weight  Hip abduction no weight Feet on ball K2C, trunk rotation, posterior isometrics and isometric abs STM to back and hips Manual sheet traction gently  04/28/23 Bike  level 4 x 5 minutes with towel roll behind back 5# straight arm pulls cues for posture and core activation Feet on ball K2C, rotation, bridge, isometric abs Passive LE stretches STM to the right low back and buttock Tgun to above Manual sheet traction  04/24/23 Gait walking around the back building, she reported no increase of LBP but was already pain at a 7/10, she reports that she did not have to push on her back with her hands Feet on ball K2C, rotation, small bridge, isometric abs Passive LE stretches Passive gentle sheet lumbar traction STM with the T-gun to the lumbar area and the glutes  04/21/23 Gait outside around the parking [employer] level 4 x 5 minutes 5# straight arm pulls mild increase of LBP Feet on ball K2C, rotation, smaal posterior activation , isometric abs Passive LE stretches STM with the T-gun to the right low back and buttock on the right Manual pelvic traction  04/17/23 Gait outside descent pace, this does increase pain Bike level 3 x 5 minutes PROM LE's STM with the T-gun to the low back and buttock Gentle manual sheet traction  04/15/23 Nustep level 5 x 6 minutes Bike level 3 x 5 minutes Leg press 20# and no weight  5# straight arm pulls STM to the right low back and buttock Gentle manual pelvic traction with sheet  04/10/23 Sit ft pelvic ROM 4 way s 15 x each Nustep L 5 Red tband seated trunk ext 2 sets 10,10 x each trunk rotation Iso abs with ball 10 x hold 3 sec Feet on ball bridge, KTC and obl ADD ball squeeze Clams and hip flex red tband 2 sets 10 Passive stretch to the LE's Very gentle manual sheet traction lumbar   04/08/23 STM with the Tgun to the right low back and right buttock Feet on ball K2C, small rotation, small bridge and isometric abs Passive stretch to the LE's Very gentle manual sheet traction lumbar  MHP/IFC to the right low back in supine feet elevated  04/03/23 Nustep L 5 6 min Sitting on dyna disc pelvic ROM  10x for pelvic ROM and stab Sitting on dyna disc for stab LAQ,hip flex,hip abd 10 x each Sitting on dyna disc shld row and ext red tband 2 sets 10 Supine LE stretches- HS, LTR, SKTC, piriformis, glutes RT LE NT stretching-positive MH/IFC RT LB supine   04/01/23 Walk outdoors  NuStep L5x55mins  Seated row 20# 2x10 Lat pull down 20# 2x10 BlackTB ext 2x10 Supine LE stretches- HS, LTR, SKTC, piriformis, glutes    03/26/23 NuStep L5 x 6 min Bridges LE on pball bridges x5, K2C x10, Oblq   Passive stretching to HS, piriformis, ITB, Glute Shoulder Ext 5lb x10 Standing rows 5lb x12  03/20/23 Evaluation of the lumbar spine order from Dr. Ellery Guthrie  PATIENT EDUCATION:  Education details: POC and HEP Person educated: Patient Education method: Explanation Education comprehension: verbalized understanding  HOME EXERCISE PROGRAM: Access Code: [facility] Access Code: [email or web address] URL: [email or web address] Date: 03/20/2023 Prepared by: Cherylene Corrente  Exercises - Hooklying Single Knee to Chest Stretch  - 2 x daily - 7 x weekly - 1 sets - 10 reps - 5 hold - Supine Double Knee to Chest  - 2 x daily - 7 x weekly - 1 sets - 10 reps - 5 hold -  Supine Lower Trunk Rotation with PLB  - 2 x daily - 7 x weekly - 1 sets - 10 reps - 5 hold - Supine Posterior Pelvic Tilt  - 2 x daily - 7 x weekly - 1 sets - 10 reps - 3 hold  ASSESSMENT:  CLINICAL IMPRESSION:   there is a right disc herniation at L2-3.  We continue to work on light function and pain relief, she is reporting less pain and able to do more with less pain overall, she reports that she will have breast surgery on June 26th we will have to stop PT, wants to be strong and good by that time.  She did drive in a car over the weekend about 10 hours total and was surprised that she did well OBJECTIVE IMPAIRMENTS: Abnormal gait, cardiopulmonary status limiting activity, decreased activity tolerance, decreased endurance, decreased  mobility, difficulty walking, decreased ROM, decreased strength, increased muscle spasms, impaired flexibility, improper body mechanics, postural dysfunction, and pain.   REHAB POTENTIAL: Good  CLINICAL DECISION MAKING: Stable/uncomplicated  EVALUATION COMPLEXITY: Low   GOALS: Goals reviewed with patient? Yes  SHORT TERM GOALS: Target date: 10/24/22  Patient will be independent with initial HEP. Goal status: 09/20/22 met  2.  Patient will be do TUG < 30s Baseline: 1 min 28s  Goal status: ongoing 10/07/22, 18.57s MET 10/09/22  3.  Patient will be able to do 5 sit to stands  Baseline: unable to do  Goal status: progressing 10/04/22, MET 10/09/22   LONG TERM GOALS: Target date: 05/18/23  Patient will be independent with advanced/ongoing HEP to improve outcomes and carryover.  Goal status: ongoing 11/06/22  2.  Patient will report at least 75% improvement in R knee pain to improve QOL. Baseline: 10/10 Goal status: met 02/03/23 60% better 11/20/22, 65% 12/06/22, 70% 12/17/22  3.  Patient will demonstrate improved R knee AROM to >/= 0-120 deg to allow for normal gait and stair mechanics. Baseline: 30-0-60 10/09/22: 16-0-78  11/01/22: 14-0-83 11/20/22: 10-97 12/06/22: 10-0-100 12/17/22: 8-0-104 05/13/23: 5-0-115 Goal status: continue to progress and getting closer 0-112  03/13/23. Met 06/19/23  4.  Patient will demonstrate improved functional LE strength as demonstrated by 5/5. Baseline: 2/5 Goal status: MET 3+/5 10/09/22, 5/5 11/01/22   5.  Patient will be able to ambulate 500' with LRAD and normal gait pattern without increased pain to access community.  Baseline: using RW small in home ambulation distances Goal status: progressing w/SPC 12/02/22, doing some walking without AD, MET 05/13/23  6.  Patient will report 20 on FOTO (patient reported outcome measure) to demonstrate improved functional ability. Baseline: 4, 40-11/11/24 Goal status: MET 53 12/17/22  7.  Decrease lumbar  pain 50%  Goal status: ongoing 05/01/23, 45% 05/13/23  8.  Increase lumbar ROM 25%  Goal status: ongoing 04/24/23, ongoing 05/13/23-- extension, L side bending, and R rotation is very limited and painful   9.  Report able to put shoes and socks on without pain > 4/10  Goal status: ongoing still pain 7/10 05/01/23, MET 05/13/23  PLAN:  PT FREQUENCY: 2x/week  PT DURATION: 12 weeks  PLANNED INTERVENTIONS: Therapeutic exercises, Therapeutic activity, Neuromuscular re-education, Balance training, Gait training, Patient/Family education, Self Care, Joint mobilization, Stair training, Electrical stimulation, Cryotherapy, Moist heat, scar mobilization, Vasopneumatic device, and Manual therapy  PLAN FOR NEXT SESSION: Will continue to add some core stability and overall strength to prepare her for her surgery in 2 weeks   Patient Details  Name: [name] MRN:  [id number] Date of Birth: 1960/12/16 Referring Provider:  Elna Haggis, MD  Encounter Date: 06/30/2023   Hollis Lurie, PT 06/30/2023, 2:07 PM

## 2023-07-02 ENCOUNTER — Encounter: Payer: Self-pay | Admitting: Professional Counselor

## 2023-07-02 ENCOUNTER — Ambulatory Visit: Admitting: Professional Counselor

## 2023-07-02 DIAGNOSIS — F411 Generalized anxiety disorder: Secondary | ICD-10-CM | POA: Diagnosis not present

## 2023-07-02 DIAGNOSIS — F330 Major depressive disorder, recurrent, mild: Secondary | ICD-10-CM | POA: Diagnosis not present

## 2023-07-02 NOTE — Progress Notes (Signed)
      [facility] Counselor/Therapist Progress Note  Patient ID: [name], MRN: [id number],    Date: 07/02/2023  Time Spent:  10:09 AM - 11:08 AM  Treatment Type: Family with patient  Patient present to session with spouse.   Reported Symptoms: anticipatory grief/loss, emotional dysregulation, worries, low mood, restlessness, nervousness, irritability, stress, health concerns, interpersonal concerns  Mental Status Exam:  Appearance:   Neat     Behavior:  Appropriate, Sharing, and Motivated  Motor:  Normal  Speech/Language:   Clear and Coherent and Normal Rate  Affect:  Appropriate and Congruent  Mood:  anxious  Thought process:  normal  Thought content:    WNL  Sensory/Perceptual disturbances:    WNL  Orientation:  oriented to person, place, time/date, and situation  Attention:  Good  Concentration:  Good  Memory:  WNL  Fund of knowledge:   Good  Insight:    Good  Judgment:   Good  Impulse Control:  Good   Risk Assessment: Danger to Self:  No Self-injurious Behavior: No Danger to Others: No Duty to Warn:no Physical Aggression / Violence:No  Access to Firearms a concern: No  Gang Involvement:No   Subjective: Patient presented to session to address concerns of anxiety and depression.  Patient spouse accompanied her to session.  Patient reported progress.  She reported to be sleeping better and for medication changes to be positive in mitigating symptomology.  She reported surgery scheduled for the 26 and to have a positive hopeful outlook regarding health concerns.  Counselor actively listened and affirmed patient strengths and resiliency.  Patient identified challenges during recent trip with husband regarding their communication issues, and counselor helped to develop strategies for improvement including practicing emotional regulation skills and interpersonal effectiveness skills.  Patient husband offered feedback to patient as did patient to husband, and they  discussed corrective emotional experience around use of nonviolent communication skills.  Counselor provided resources for home use.  Interventions: Solution-Oriented/Positive Psychology, Humanistic/Existential, Insight-Oriented, and Interpersonal  Diagnosis:   ICD-10-CM   1. Generalized anxiety disorder  F41.1     2. Major depressive disorder, recurrent episode, mild (HCC)  F33.0       Plan: Pt is scheduled for a follow-up; continue process work and developing coping skills. Pt STG between sessions to practice emotional regulation skills, utilize conflict resolution resource as needed, practice interrupting conflict cycle and choosing healthy approaches, and work to give and receive feedback conscientiously.    [name], [facility]

## 2023-07-03 ENCOUNTER — Encounter: Payer: Self-pay | Admitting: Physical Therapy

## 2023-07-03 ENCOUNTER — Ambulatory Visit: Admitting: Physical Therapy

## 2023-07-03 DIAGNOSIS — Z96651 Presence of right artificial knee joint: Secondary | ICD-10-CM

## 2023-07-03 DIAGNOSIS — G8929 Other chronic pain: Secondary | ICD-10-CM

## 2023-07-03 DIAGNOSIS — M25561 Pain in right knee: Secondary | ICD-10-CM

## 2023-07-03 DIAGNOSIS — M6281 Muscle weakness (generalized): Secondary | ICD-10-CM

## 2023-07-03 DIAGNOSIS — M25661 Stiffness of right knee, not elsewhere classified: Secondary | ICD-10-CM

## 2023-07-03 DIAGNOSIS — M549 Dorsalgia, unspecified: Secondary | ICD-10-CM | POA: Diagnosis not present

## 2023-07-03 NOTE — Therapy (Signed)
 OUTPATIENT PHYSICAL THERAPY LOWER EXTREMITY TREATMENT    Patient Name: [name] MRN: [id number] DOB:[birth year], 63 y.o., female Today's Date: 07/03/2023  END OF SESSION:  PT End of Session - 07/03/23 1400     Visit Number 93    Date for PT Re-Evaluation 07/19/23    Authorization Type [employer]    PT Start Time 1400    PT Stop Time 1445    PT Time Calculation (min) 45 min    Activity Tolerance Patient tolerated treatment well    Behavior During Therapy WFL for tasks assessed/performed            Past Medical History:  Diagnosis Date   Arthritis    Back pain of lumbar region with sciatica    Cancer (HCC)    breast   Depression    Diabetes mellitus, type II (HCC)    Family history of anesthesia complication    grandfather died under anesthesia 70's- had heart issues   H/O carpal tunnel repair 2014   Headache(784.0)    Hyperlipidemia    Hypertension    Hypothyroidism    Medial meniscus tear    PONV (postoperative nausea and vomiting)    Sleep apnea    cpap since 10   Past Surgical History:  Procedure Laterality Date   BILATERAL TOTAL MASTECTOMY WITH AXILLARY LYMPH NODE DISSECTION     BREAST RECONSTRUCTION     CERVICAL DISC ARTHROPLASTY N/A 03/19/2013   Procedure: CERVICAL FIVE TO SIX, CERVICAL SIX TO SEVEN CERVICAL ANTERIOR DISC ARTHROPLASTY;  Surgeon: Elna Haggis, MD;  Location: [facility];  Service: Neurosurgery;  Laterality: N/A;  C56 C67 artificial disc replacement   CESAREAN SECTION     COLONOSCOPY     DILATATION & CURETTAGE/HYSTEROSCOPY WITH TRUECLEAR N/A 11/25/2013   Procedure: DILATATION & CURETTAGE/HYSTEROSCOPY WITH TRUCLEAR;  Surgeon: Piedad Brewer, MD;  Location: [facility];  Service: Gynecology;  Laterality: N/A;   ECTOPIC PREGNANCY SURGERY     GALLBLADDER SURGERY     MOUTH SURGERY     child- dog bite   SPINAL FUSION     2023   STERIOD INJECTION Left 09/09/2022   Procedure: LEFT KNEE STEROID INJECTION;  Surgeon: Liliane Rei, MD;  Location:  [facility];  Service: Orthopedics;  Laterality: Left;  left knee injection 0928   TONSILLECTOMY     TOTAL KNEE ARTHROPLASTY Right 09/09/2022   Procedure: TOTAL KNEE ARTHROPLASTY;  Surgeon: Liliane Rei, MD;  Location: [facility];  Service: Orthopedics;  Laterality: Right;   TUBAL LIGATION     Patient Active Problem List   Diagnosis Date Noted   OA (osteoarthritis) of knee 09/09/2022   Exertional dyspnea 04/17/2022   Elevated coronary artery calcium  score 04/17/2022   Spondylolisthesis at L4-L5 level 12/27/2021   Ductal carcinoma in situ (DCIS) of left breast 01/19/2018   Generalized anxiety disorder 11/07/2017   Depression 11/07/2017   Insomnia 11/07/2017   Cervical spondylosis 03/19/2013    PCP: Margarete Sharps  REFERRING PROVIDER: Athena Lazier  REFERRING DIAG: R TKA 09/09/22  THERAPY DIAG:  Chronic bilateral back pain, unspecified back location  S/P total knee arthroplasty, right  Stiffness of right knee, not elsewhere classified  Muscle weakness (generalized)  Acute pain of right knee  Rationale for Evaluation and Treatment: Rehabilitation  ONSET DATE: 09/09/22  SUBJECTIVE:   SUBJECTIVE STATEMENT :  Doing pretty good, did drive to and from [facility] on Tuesday  Saw Dr. Ellery Guthrie last Friday, has a bulging disc on the right at L2,  recommended continuation of PT.  Will have injection in back May 20th R TKA 8/19 L knee steroid injection 09/09/22  PAIN:  Are you having pain? Yes: NPRS scale: 4/10 Pain location: right low back, buttock and reports around to the front of the thigh Pain description: sharp really hurts Aggravating factors: difficulty sleeping, difficulty walking and standing and sitting Relieving factors: heat, Aleve , tramadol  and methacarbomol  PRECAUTIONS: None  RED FLAGS: None   WEIGHT BEARING RESTRICTIONS: No  FALLS:  Has patient fallen in last 6 months? No  LIVING ENVIRONMENT: Lives with: lives with their family and lives with their spouse Lives in:  House/apartment Stairs: Yes: External: 3 steps; none Has following equipment at home: Single point cane and Walker - 2 wheeled  OCCUPATION: Not working  PLOF: Independent  PATIENT GOALS: less back pain  NEXT MD VISIT: 09/24/22  OBJECTIVE:   PATIENT SURVEYS:  FOTO 4  COGNITION: Overall cognitive status: Within functional limits for tasks assessed      EDEMA:  Increased swelling surrounding R knee  POSTURE: rounded shoulders and flexed trunk   PALPATION: Warm and TTP  LOWER EXTREMITY ROM:  Active ROM Right eval Left eval Right 09/24/22 Right 09/26/22 Right 10/02/22  11/01/22 12/09/22  Hip flexion          Hip extension          Hip abduction          Hip adduction          Hip internal rotation          Hip external rotation          Knee flexion  60  75* AROM, 86 PROM 82* AROM seated per pt request  78* self AAROM, 76* AROM  PROM 87 AROM 81 PROM 90 AROM 83 AROM 101 PROM 105  Knee extension -30  -15* AROM seated, -15 PROM supine   -16* supine heel prop with quad set  AROM 25 PROM 16 AROM 14 AROM 5 PROM 0  Ankle dorsiflexion          Ankle plantarflexion          Ankle inversion          Ankle eversion           (Blank rows = not tested)  LOWER EXTREMITY MMT:  MMT Right eval Left eval  Hip flexion 2   Hip extension    Hip abduction    Hip adduction    Hip internal rotation    Hip external rotation    Knee flexion 2   Knee extension 2   Ankle dorsiflexion    Ankle plantarflexion    Ankle inversion    Ankle eversion     (Blank rows = not tested)  LUMBAR ROM:  decreased 50% flexion, decreased 75% for extension and side bending, pain was worse with extension and right side bending. Hooklying is less painful that lying flat on back Mild tightness with pain during HS stretch, tight and painful piriformis bilaterally worse on the right , very weak core  FUNCTIONAL TESTS:  5 times sit to stand: 30 seconds 03/20/23 Timed up and go (TUG): 1 min  28s  GAIT: Is no without assistive device, mild antalgic on the right    TODAY'S TREATMENT:  DATE:  07/03/23 Bike level 4 x 7 minutes 5# straight arm pulls 15# rows 2x10 15# Lats 2x10 Black tband seated extension Feet on ball K2C, rotation, small bridge, isometric abs Manual sheet traction lumbar  06/30/23 Bike level 4 x 6 minutes 5# straight arm pulls 5# rows 15# lats Feet on ball K2C, rotation, small bridge, isometric abs Passive LE stretch Manual sheet traction  06/24/23 Gait around the back building  Bike level 4 x 5 minutes Feet on ball, K2C, rotation, small bridge, isometric abs Passive stretch LE STM to the low back and right buttock Manual pelvic traction  06/19/23 Gait around the back building Bike level 4 x 5 minutes Feet on ball K2C, rotation, small bridge, isometric abs Blue tband hip extension Blue tband clamshells Small bridges Passive stretch LE's Sheet lumbar traction  06/17/23 Bike level 4 x 6 minutes Back to wall small scapular touches Back to wall weighted ball overhead reach Small standing extension Feet on ball K2C, small rotation, posterior isometrics, ab isometrics Passive stretch LE's Ball b/n knees squeeze Green tband clamshells Blue tband supine hip extension  06/09/23 Bike level 5 x 6 minutes Gait outside around the back building Seated rows 20# Lats 20# Straight arm pulls 5# Feet on ball K2C, rotation, bridge, isometric abs Passive stretch LE's STM to the Low back  06/04/23 Bike Level 5 x 6 minutes Walk around the parking [place] on [place] straight arm pulls 5# 15# seated rows Feet on ball K2C, rotation, small bridge, isometric abs Passive stretch LE's Manual sheet traction STM with the T-gun right low back and right buttock  06/02/23 Bike level 4 x 6 minutes Prone over two pillows x 2 minutes Then in  road kill position with right leg ER and out for 2 minutes Passive stretch LE's STM to the right low back and right buttock Manual sheet traction  05/28/23 Bike level 4 x 6 minutes 5# straight arm pulls Feet on ball K2C, rotation, small bridge, isometric abs Passive stretch LE's Sheet traction STM to the right buttock and back  05/26/23 Bike level 5 x 6 minutes 5# straight arm pulls 20# rows 2x10 20# lats 2x10 5# AR press Passive stretch LE's STM to the right buttock, HS and low back Manual sheet traction lumbar  05/22/23 PROM LE's STM to the buttocks, HS and Lumbar area into the thoracic area Manual sheet traction  05/19/23 Bike level 4 x 6 minutes 5# straight arm pulls Seated rows 20# Lats 20# 10# AR press some pain in leg so we stopped Feet on ball K2C, rotation, bridges, isometric abs Passive stretch, STM to the lumbar area left, sheet traction   05/15/23 Bike level 5 x 5 minutes 5# straight arm pulls cues for core activation Feet on ball K2C, rotation, small bridge, isometric abs Passive LE stretches STM to the right buttock and the bilateral lumbar paraspinals Manual sheet tractoin  05/13/23 Recheck goals for progress note  Gait around parking lot  NuStep L5x25mins  5# straight arm pulls 2x10 AR press 10# 2x10 Calf stretch 30s  Supine LE stretches- LTR, HS, SKTC, figure 4    05/07/23 Bike level 5 x 6 minutes 5# straight arm pulls 5 # hip extension some pain Feet on ball K2C, rotation, small bridge, isometric abs Manual sheet traction STM with the Tgun in the right low back and buttock  05/05/23 Bike level 5 x 6 minutes Gait outside around the parking [place] 5# straight arm pulls 15# lats Feet on ball  K2C, rotation, bridges, isometric abs Manual sheet traction STM to the right low back and buttock  05/01/23 Bike level 4 x  minute 5# straight arm pulls 5# AR press Hip extension no weight Hip abduction no weight Feet on ball K2C, trunk rotation,  posterior isometrics and isometric abs STM to back and hips Manual sheet traction gently  04/28/23 Bike level 4 x 5 minutes with towel roll behind back 5# straight arm pulls cues for posture and core activation Feet on ball K2C, rotation, bridge, isometric abs Passive LE stretches STM to the right low back and buttock Tgun to above Manual sheet traction  04/24/23 Gait walking around the back building, she reported no increase of LBP but was already pain at a 7/10, she reports that she did not have to push on her back with her hands Feet on ball K2C, rotation, small bridge, isometric abs Passive LE stretches Passive gentle sheet lumbar traction STM with the T-gun to the lumbar area and the glutes  04/21/23 Gait outside around the parking [employer] level 4 x 5 minutes 5# straight arm pulls mild increase of LBP Feet on ball K2C, rotation, smaal posterior activation , isometric abs Passive LE stretches STM with the T-gun to the right low back and buttock on the right Manual pelvic traction  04/17/23 Gait outside descent pace, this does increase pain Bike level 3 x 5 minutes PROM LE's STM with the T-gun to the low back and buttock Gentle manual sheet traction  04/15/23 Nustep level 5 x 6 minutes Bike level 3 x 5 minutes Leg press 20# and no weight  5# straight arm pulls STM to the right low back and buttock Gentle manual pelvic traction with sheet  04/10/23 Sit ft pelvic ROM 4 way s 15 x each Nustep L 5 Red tband seated trunk ext 2 sets 10,10 x each trunk rotation Iso abs with ball 10 x hold 3 sec Feet on ball bridge, KTC and obl ADD ball squeeze Clams and hip flex red tband 2 sets 10 Passive stretch to the LE's Very gentle manual sheet traction lumbar   PATIENT EDUCATION:  Education details: POC and HEP Person educated: Patient Education method: Explanation Education comprehension: verbalized understanding  HOME EXERCISE PROGRAM: Access Code: [facility] Access  Code: [email or web address] URL: [email or web address] Date: 03/20/2023 Prepared by: Cherylene Corrente  Exercises - Hooklying Single Knee to Chest Stretch  - 2 x daily - 7 x weekly - 1 sets - 10 reps - 5 hold - Supine Double Knee to Chest  - 2 x daily - 7 x weekly - 1 sets - 10 reps - 5 hold - Supine Lower Trunk Rotation with PLB  - 2 x daily - 7 x weekly - 1 sets - 10 reps - 5 hold - Supine Posterior Pelvic Tilt  - 2 x daily - 7 x weekly - 1 sets - 10 reps - 3 hold  ASSESSMENT:  CLINICAL IMPRESSION:   there is a right disc herniation at L2-3.  We continue to work on light function and pain relief, she is reporting less pain and able to do more with less pain overall, she reports that she will have breast surgery on June 26th we will have to stop PT, wants to be strong and good by that time.  I did add some weight today to the exercises and added black tband lumbar extension.  No pain during the exercises.  She did report that she bent over with her legs  straight and picked up her dog, this caused some back pain.  REports just very sore OBJECTIVE IMPAIRMENTS: Abnormal gait, cardiopulmonary status limiting activity, decreased activity tolerance, decreased endurance, decreased mobility, difficulty walking, decreased ROM, decreased strength, increased muscle spasms, impaired flexibility, improper body mechanics, postural dysfunction, and pain.   REHAB POTENTIAL: Good  CLINICAL DECISION MAKING: Stable/uncomplicated  EVALUATION COMPLEXITY: Low   GOALS: Goals reviewed with patient? Yes  SHORT TERM GOALS: Target date: 10/24/22  Patient will be independent with initial HEP. Goal status: 09/20/22 met  2.  Patient will be do TUG < 30s Baseline: 1 min 28s  Goal status: ongoing 10/07/22, 18.57s MET 10/09/22  3.  Patient will be able to do 5 sit to stands  Baseline: unable to do  Goal status: progressing 10/04/22, MET 10/09/22   LONG TERM GOALS: Target date: 05/18/23  Patient will be  independent with advanced/ongoing HEP to improve outcomes and carryover.  Goal status: ongoing 11/06/22  2.  Patient will report at least 75% improvement in R knee pain to improve QOL. Baseline: 10/10 Goal status: met 02/03/23 60% better 11/20/22, 65% 12/06/22, 70% 12/17/22  3.  Patient will demonstrate improved R knee AROM to >/= 0-120 deg to allow for normal gait and stair mechanics. Baseline: 30-0-60 10/09/22: 16-0-78  11/01/22: 14-0-83 11/20/22: 10-97 12/06/22: 10-0-100 12/17/22: 8-0-104 05/13/23: 5-0-115 Goal status: continue to progress and getting closer 0-112  03/13/23. Met 06/19/23  4.  Patient will demonstrate improved functional LE strength as demonstrated by 5/5. Baseline: 2/5 Goal status: MET 3+/5 10/09/22, 5/5 11/01/22   5.  Patient will be able to ambulate 500' with LRAD and normal gait pattern without increased pain to access community.  Baseline: using RW small in home ambulation distances Goal status: progressing w/SPC 12/02/22, doing some walking without AD, MET 05/13/23  6.  Patient will report 88 on FOTO (patient reported outcome measure) to demonstrate improved functional ability. Baseline: 4, 40-11/11/24 Goal status: MET 53 12/17/22  7.  Decrease lumbar pain 50%  Goal status: ongoing 05/01/23, 45% 05/13/23  8.  Increase lumbar ROM 25%  Goal status: ongoing 04/24/23, ongoing 05/13/23-- extension, L side bending, and R rotation is very limited and painful   9.  Report able to put shoes and socks on without pain > 4/10  progressing 07/03/23  Goal status: ongoing still pain 7/10 05/01/23, MET 05/13/23  PLAN:  PT FREQUENCY: 2x/week  PT DURATION: 12 weeks  PLANNED INTERVENTIONS: Therapeutic exercises, Therapeutic activity, Neuromuscular re-education, Balance training, Gait training, Patient/Family education, Self Care, Joint mobilization, Stair training, Electrical stimulation, Cryotherapy, Moist heat, scar mobilization, Vasopneumatic device, and Manual therapy  PLAN  FOR NEXT SESSION: Will continue to add some core stability and overall strength to prepare her for her surgery in 2 weeks   Patient Details  Name: SEBRINA KESSNER MRN: [id number] Date of Birth: September 06, 1960 Referring Provider:  Elna Haggis, MD  Encounter Date: 07/03/2023   Hollis Lurie, PT 07/03/2023, 2:03 PM

## 2023-07-07 ENCOUNTER — Ambulatory Visit: Admitting: Physical Therapy

## 2023-07-07 ENCOUNTER — Encounter: Payer: Self-pay | Admitting: Physical Therapy

## 2023-07-07 DIAGNOSIS — M25661 Stiffness of right knee, not elsewhere classified: Secondary | ICD-10-CM

## 2023-07-07 DIAGNOSIS — Z96651 Presence of right artificial knee joint: Secondary | ICD-10-CM

## 2023-07-07 DIAGNOSIS — M6281 Muscle weakness (generalized): Secondary | ICD-10-CM

## 2023-07-07 DIAGNOSIS — G8929 Other chronic pain: Secondary | ICD-10-CM

## 2023-07-07 DIAGNOSIS — M549 Dorsalgia, unspecified: Secondary | ICD-10-CM | POA: Diagnosis not present

## 2023-07-07 NOTE — Therapy (Signed)
 OUTPATIENT PHYSICAL THERAPY LOWER EXTREMITY TREATMENT    Patient Name: [name] MRN: [id number] DOB:1960-10-09, 63 y.o., female Today's Date: 07/07/2023  END OF SESSION:  PT End of Session - 07/07/23 1405     Visit Number 94    Date for PT Re-Evaluation 07/19/23    Authorization Type [employer]    PT Start Time 1400    PT Stop Time 1445    PT Time Calculation (min) 45 min    Activity Tolerance Patient tolerated treatment well    Behavior During Therapy WFL for tasks assessed/performed            Past Medical History:  Diagnosis Date   Arthritis    Back pain of lumbar region with sciatica    Cancer (HCC)    breast   Depression    Diabetes mellitus, type II (HCC)    Family history of anesthesia complication    grandfather died under anesthesia 70's- had heart issues   H/O carpal tunnel repair 2014   Headache(784.0)    Hyperlipidemia    Hypertension    Hypothyroidism    Medial meniscus tear    PONV (postoperative nausea and vomiting)    Sleep apnea    cpap since 10   Past Surgical History:  Procedure Laterality Date   BILATERAL TOTAL MASTECTOMY WITH AXILLARY LYMPH NODE DISSECTION     BREAST RECONSTRUCTION     CERVICAL DISC ARTHROPLASTY N/A 03/19/2013   Procedure: CERVICAL FIVE TO SIX, CERVICAL SIX TO SEVEN CERVICAL ANTERIOR DISC ARTHROPLASTY;  Surgeon: Elna Haggis, MD;  Location: [facility];  Service: Neurosurgery;  Laterality: N/A;  C56 C67 artificial disc replacement   CESAREAN SECTION     COLONOSCOPY     DILATATION & CURETTAGE/HYSTEROSCOPY WITH TRUECLEAR N/A 11/25/2013   Procedure: DILATATION & CURETTAGE/HYSTEROSCOPY WITH TRUCLEAR;  Surgeon: Piedad Brewer, MD;  Location: [facility];  Service: Gynecology;  Laterality: N/A;   ECTOPIC PREGNANCY SURGERY     GALLBLADDER SURGERY     MOUTH SURGERY     child- dog bite   SPINAL FUSION     2023   STERIOD INJECTION Left 09/09/2022   Procedure: LEFT KNEE STEROID INJECTION;  Surgeon: Liliane Rei, MD;  Location:  [facility];  Service: Orthopedics;  Laterality: Left;  left knee injection 0928   TONSILLECTOMY     TOTAL KNEE ARTHROPLASTY Right 09/09/2022   Procedure: TOTAL KNEE ARTHROPLASTY;  Surgeon: Liliane Rei, MD;  Location: [facility];  Service: Orthopedics;  Laterality: Right;   TUBAL LIGATION     Patient Active Problem List   Diagnosis Date Noted   OA (osteoarthritis) of knee 09/09/2022   Exertional dyspnea 04/17/2022   Elevated coronary artery calcium  score 04/17/2022   Spondylolisthesis at L4-L5 level 12/27/2021   Ductal carcinoma in situ (DCIS) of left breast 01/19/2018   Generalized anxiety disorder 11/07/2017   Depression 11/07/2017   Insomnia 11/07/2017   Cervical spondylosis 03/19/2013    PCP: Margarete Sharps  REFERRING PROVIDER: Athena Lazier  REFERRING DIAG: R TKA 09/09/22  THERAPY DIAG:  Chronic bilateral back pain, unspecified back location  S/P total knee arthroplasty, right  Stiffness of right knee, not elsewhere classified  Muscle weakness (generalized)  Rationale for Evaluation and Treatment: Rehabilitation  ONSET DATE: 09/09/22  SUBJECTIVE:   SUBJECTIVE STATEMENT :  Doing okay, just feel stiff in my back, pain a 5/10  Saw Dr. Ellery Guthrie last Friday, has a bulging disc on the right at L2, recommended continuation of PT.  Will  have injection in back May 20th R TKA 8/19 L knee steroid injection 09/09/22  PAIN:  Are you having pain? Yes: NPRS scale: 4/10 Pain location: right low back, buttock and reports around to the front of the thigh Pain description: sharp really hurts Aggravating factors: difficulty sleeping, difficulty walking and standing and sitting Relieving factors: heat, Aleve , tramadol  and methacarbomol  PRECAUTIONS: None  RED FLAGS: None   WEIGHT BEARING RESTRICTIONS: No  FALLS:  Has patient fallen in last 6 months? No  LIVING ENVIRONMENT: Lives with: lives with their family and lives with their spouse Lives in: House/apartment Stairs: Yes:  External: 3 steps; none Has following equipment at home: Single point cane and Walker - 2 wheeled  OCCUPATION: Not working  PLOF: Independent  PATIENT GOALS: less back pain  NEXT MD VISIT: 09/24/22  OBJECTIVE:   PATIENT SURVEYS:  FOTO 4  COGNITION: Overall cognitive status: Within functional limits for tasks assessed      EDEMA:  Increased swelling surrounding R knee  POSTURE: rounded shoulders and flexed trunk   PALPATION: Warm and TTP  LOWER EXTREMITY ROM:  Active ROM Right eval Left eval Right 09/24/22 Right 09/26/22 Right 10/02/22  11/01/22 12/09/22  Hip flexion          Hip extension          Hip abduction          Hip adduction          Hip internal rotation          Hip external rotation          Knee flexion  60  75* AROM, 86 PROM 82* AROM seated per pt request  78* self AAROM, 76* AROM  PROM 87 AROM 81 PROM 90 AROM 83 AROM 101 PROM 105  Knee extension -30  -15* AROM seated, -15 PROM supine   -16* supine heel prop with quad set  AROM 25 PROM 16 AROM 14 AROM 5 PROM 0  Ankle dorsiflexion          Ankle plantarflexion          Ankle inversion          Ankle eversion           (Blank rows = not tested)  LOWER EXTREMITY MMT:  MMT Right eval Left eval  Hip flexion 2   Hip extension    Hip abduction    Hip adduction    Hip internal rotation    Hip external rotation    Knee flexion 2   Knee extension 2   Ankle dorsiflexion    Ankle plantarflexion    Ankle inversion    Ankle eversion     (Blank rows = not tested)  LUMBAR ROM:  decreased 50% flexion, decreased 75% for extension and side bending, pain was worse with extension and right side bending. Hooklying is less painful that lying flat on back Mild tightness with pain during HS stretch, tight and painful piriformis bilaterally worse on the right , very weak core  FUNCTIONAL TESTS:  5 times sit to stand: 30 seconds 03/20/23 Timed up and go (TUG): 1 min 28s  GAIT: Is no without assistive device,  mild antalgic on the right    TODAY'S TREATMENT:  DATE:  07/07/23 Bike level 5 x 6 minutes 20# rows 20# lats 20# leg press Feet on ball k2c, rotation, bridge, isometric abs Black tband trunk flexion and extension STM to the low back Manual sheet traction  07/03/23 Bike level 4 x 7 minutes 5# straight arm pulls 15# rows 2x10 15# Lats 2x10 Black tband seated extension Feet on ball K2C, rotation, small bridge, isometric abs Manual sheet traction lumbar  06/30/23 Bike level 4 x 6 minutes 5# straight arm pulls 5# rows 15# lats Feet on ball K2C, rotation, small bridge, isometric abs Passive LE stretch Manual sheet traction  06/24/23 Gait around the back building  Bike level 4 x 5 minutes Feet on ball, K2C, rotation, small bridge, isometric abs Passive stretch LE STM to the low back and right buttock Manual pelvic traction  06/19/23 Gait around the back building Bike level 4 x 5 minutes Feet on ball K2C, rotation, small bridge, isometric abs Blue tband hip extension Blue tband clamshells Small bridges Passive stretch LE's Sheet lumbar traction  06/17/23 Bike level 4 x 6 minutes Back to wall small scapular touches Back to wall weighted ball overhead reach Small standing extension Feet on ball K2C, small rotation, posterior isometrics, ab isometrics Passive stretch LE's Ball b/n knees squeeze Green tband clamshells Blue tband supine hip extension  06/09/23 Bike level 5 x 6 minutes Gait outside around the back building Seated rows 20# Lats 20# Straight arm pulls 5# Feet on ball K2C, rotation, bridge, isometric abs Passive stretch LE's STM to the Low back  06/04/23 Bike Level 5 x 6 minutes Walk around the parking [place] on [place] straight arm pulls 5# 15# seated rows Feet on ball K2C, rotation, small bridge, isometric abs Passive stretch  LE's Manual sheet traction STM with the T-gun right low back and right buttock  06/02/23 Bike level 4 x 6 minutes Prone over two pillows x 2 minutes Then in road kill position with right leg ER and out for 2 minutes Passive stretch LE's STM to the right low back and right buttock Manual sheet traction  05/28/23 Bike level 4 x 6 minutes 5# straight arm pulls Feet on ball K2C, rotation, small bridge, isometric abs Passive stretch LE's Sheet traction STM to the right buttock and back  05/26/23 Bike level 5 x 6 minutes 5# straight arm pulls 20# rows 2x10 20# lats 2x10 5# AR press Passive stretch LE's STM to the right buttock, HS and low back Manual sheet traction lumbar  05/22/23 PROM LE's STM to the buttocks, HS and Lumbar area into the thoracic area Manual sheet traction  05/19/23 Bike level 4 x 6 minutes 5# straight arm pulls Seated rows 20# Lats 20# 10# AR press some pain in leg so we stopped Feet on ball K2C, rotation, bridges, isometric abs Passive stretch, STM to the lumbar area left, sheet traction   05/15/23 Bike level 5 x 5 minutes 5# straight arm pulls cues for core activation Feet on ball K2C, rotation, small bridge, isometric abs Passive LE stretches STM to the right buttock and the bilateral lumbar paraspinals Manual sheet tractoin  05/13/23 Recheck goals for progress note  Gait around parking lot  NuStep L5x60mins  5# straight arm pulls 2x10 AR press 10# 2x10 Calf stretch 30s  Supine LE stretches- LTR, HS, SKTC, figure 4    05/07/23 Bike level 5 x 6 minutes 5# straight arm pulls 5 # hip extension some pain Feet on ball K2C, rotation, small bridge, isometric abs  Manual sheet traction STM with the Tgun in the right low back and buttock  05/05/23 Bike level 5 x 6 minutes Gait outside around the parking [place] 5# straight arm pulls 15# lats Feet on ball K2C, rotation, bridges, isometric abs Manual sheet traction STM to the right low back and  buttock  05/01/23 Bike level 4 x  minute 5# straight arm pulls 5# AR press Hip extension no weight Hip abduction no weight Feet on ball K2C, trunk rotation, posterior isometrics and isometric abs STM to back and hips Manual sheet traction gently  04/28/23 Bike level 4 x 5 minutes with towel roll behind back 5# straight arm pulls cues for posture and core activation Feet on ball K2C, rotation, bridge, isometric abs Passive LE stretches STM to the right low back and buttock Tgun to above Manual sheet traction  04/24/23 Gait walking around the back building, she reported no increase of LBP but was already pain at a 7/10, she reports that she did not have to push on her back with her hands Feet on ball K2C, rotation, small bridge, isometric abs Passive LE stretches Passive gentle sheet lumbar traction STM with the T-gun to the lumbar area and the glutes  04/21/23 Gait outside around the parking [employer] level 4 x 5 minutes 5# straight arm pulls mild increase of LBP Feet on ball K2C, rotation, smaal posterior activation , isometric abs Passive LE stretches STM with the T-gun to the right low back and buttock on the right Manual pelvic traction  04/17/23 Gait outside descent pace, this does increase pain Bike level 3 x 5 minutes PROM LE's STM with the T-gun to the low back and buttock Gentle manual sheet traction  04/15/23 Nustep level 5 x 6 minutes Bike level 3 x 5 minutes Leg press 20# and no weight  5# straight arm pulls STM to the right low back and buttock Gentle manual pelvic traction with sheet  04/10/23 Sit ft pelvic ROM 4 way s 15 x each Nustep L 5 Red tband seated trunk ext 2 sets 10,10 x each trunk rotation Iso abs with ball 10 x hold 3 sec Feet on ball bridge, KTC and obl ADD ball squeeze Clams and hip flex red tband 2 sets 10 Passive stretch to the LE's Very gentle manual sheet traction lumbar   PATIENT EDUCATION:  Education details: POC and  HEP Person educated: Patient Education method: Explanation Education comprehension: verbalized understanding  HOME EXERCISE PROGRAM: Access Code: [facility] Access Code: [email or web address] URL: [email or web address] Date: 03/20/2023 Prepared by: Cherylene Corrente  Exercises - Hooklying Single Knee to Chest Stretch  - 2 x daily - 7 x weekly - 1 sets - 10 reps - 5 hold - Supine Double Knee to Chest  - 2 x daily - 7 x weekly - 1 sets - 10 reps - 5 hold - Supine Lower Trunk Rotation with PLB  - 2 x daily - 7 x weekly - 1 sets - 10 reps - 5 hold - Supine Posterior Pelvic Tilt  - 2 x daily - 7 x weekly - 1 sets - 10 reps - 3 hold  ASSESSMENT:  CLINICAL IMPRESSION:   there is a right disc herniation at L2-3.   she reports that she will have breast surgery on June 26th we will have to stop PT, wants to be strong and good by that time.  I did add some weight today to the exercises and added black tband lumbar extension.  No pain  during the exercises.  I did start talking to her about finding out about restrictions after her breast surgery. OBJECTIVE IMPAIRMENTS: Abnormal gait, cardiopulmonary status limiting activity, decreased activity tolerance, decreased endurance, decreased mobility, difficulty walking, decreased ROM, decreased strength, increased muscle spasms, impaired flexibility, improper body mechanics, postural dysfunction, and pain.   REHAB POTENTIAL: Good  CLINICAL DECISION MAKING: Stable/uncomplicated  EVALUATION COMPLEXITY: Low   GOALS: Goals reviewed with patient? Yes  SHORT TERM GOALS: Target date: 10/24/22  Patient will be independent with initial HEP. Goal status: 09/20/22 met  2.  Patient will be do TUG < 30s Baseline: 1 min 28s  Goal status: ongoing 10/07/22, 18.57s MET 10/09/22  3.  Patient will be able to do 5 sit to stands  Baseline: unable to do  Goal status: progressing 10/04/22, MET 10/09/22   LONG TERM GOALS: Target date: 05/18/23  Patient will be  independent with advanced/ongoing HEP to improve outcomes and carryover.  Goal status: ongoing 11/06/22  2.  Patient will report at least 75% improvement in R knee pain to improve QOL. Baseline: 10/10 Goal status: met 02/03/23 60% better 11/20/22, 65% 12/06/22, 70% 12/17/22  3.  Patient will demonstrate improved R knee AROM to >/= 0-120 deg to allow for normal gait and stair mechanics. Baseline: 30-0-60 10/09/22: 16-0-78  11/01/22: 14-0-83 11/20/22: 10-97 12/06/22: 10-0-100 12/17/22: 8-0-104 05/13/23: 5-0-115 Goal status: continue to progress and getting closer 0-112  03/13/23. Met 06/19/23  4.  Patient will demonstrate improved functional LE strength as demonstrated by 5/5. Baseline: 2/5 Goal status: MET 3+/5 10/09/22, 5/5 11/01/22   5.  Patient will be able to ambulate 500' with LRAD and normal gait pattern without increased pain to access community.  Baseline: using RW small in home ambulation distances Goal status: progressing w/SPC 12/02/22, doing some walking without AD, MET 05/13/23  6.  Patient will report 21 on FOTO (patient reported outcome measure) to demonstrate improved functional ability. Baseline: 4, 40-11/11/24 Goal status: MET 53 12/17/22  7.  Decrease lumbar pain 50%  Goal status: ongoing 05/01/23, 45% 05/13/23  8.  Increase lumbar ROM 25%  Goal status: ongoing 04/24/23, ongoing 05/13/23-- extension, L side bending, and R rotation is very limited and painful   9.  Report able to put shoes and socks on without pain > 4/10  progressing 07/03/23  Goal status: ongoing still pain 7/10 05/01/23, MET 05/13/23  PLAN:  PT FREQUENCY: 2x/week  PT DURATION: 12 weeks  PLANNED INTERVENTIONS: Therapeutic exercises, Therapeutic activity, Neuromuscular re-education, Balance training, Gait training, Patient/Family education, Self Care, Joint mobilization, Stair training, Electrical stimulation, Cryotherapy, Moist heat, scar mobilization, Vasopneumatic device, and Manual therapy  PLAN  FOR NEXT SESSION: Will continue to add some core stability and overall strength to prepare her for her surgery next week   Patient Details  Name: [name] MRN: [id number] Date of Birth: 1960/11/23 Referring Provider:  Elna Haggis, MD  Encounter Date: 07/07/2023   Hollis Lurie, PT 07/07/2023, 2:06 PM

## 2023-07-10 ENCOUNTER — Ambulatory Visit: Admitting: Physical Therapy

## 2023-07-10 ENCOUNTER — Encounter: Payer: Self-pay | Admitting: Physical Therapy

## 2023-07-10 DIAGNOSIS — M549 Dorsalgia, unspecified: Secondary | ICD-10-CM | POA: Diagnosis not present

## 2023-07-10 DIAGNOSIS — G8929 Other chronic pain: Secondary | ICD-10-CM

## 2023-07-10 DIAGNOSIS — M25661 Stiffness of right knee, not elsewhere classified: Secondary | ICD-10-CM

## 2023-07-10 DIAGNOSIS — M25561 Pain in right knee: Secondary | ICD-10-CM

## 2023-07-10 DIAGNOSIS — M6281 Muscle weakness (generalized): Secondary | ICD-10-CM

## 2023-07-10 DIAGNOSIS — Z96651 Presence of right artificial knee joint: Secondary | ICD-10-CM

## 2023-07-10 DIAGNOSIS — R600 Localized edema: Secondary | ICD-10-CM

## 2023-07-10 NOTE — Therapy (Signed)
 OUTPATIENT PHYSICAL THERAPY LOWER EXTREMITY TREATMENT    Patient Name: [name] MRN: [id number] DOB:1960-04-09, 63 y.o., female Today's Date: 07/10/2023  END OF SESSION:  PT End of Session - 07/10/23 1404     Visit Number 95    Date for PT Re-Evaluation 07/19/23    Authorization Type [employer]    PT Start Time 1400    PT Stop Time 1445    PT Time Calculation (min) 45 min    Activity Tolerance Patient tolerated treatment well    Behavior During Therapy WFL for tasks assessed/performed            Past Medical History:  Diagnosis Date   Arthritis    Back pain of lumbar region with sciatica    Cancer (HCC)    breast   Depression    Diabetes mellitus, type II (HCC)    Family history of anesthesia complication    grandfather died under anesthesia 70's- had heart issues   H/O carpal tunnel repair 2014   Headache(784.0)    Hyperlipidemia    Hypertension    Hypothyroidism    Medial meniscus tear    PONV (postoperative nausea and vomiting)    Sleep apnea    cpap since 10   Past Surgical History:  Procedure Laterality Date   BILATERAL TOTAL MASTECTOMY WITH AXILLARY LYMPH NODE DISSECTION     BREAST RECONSTRUCTION     CERVICAL DISC ARTHROPLASTY N/A 03/19/2013   Procedure: CERVICAL FIVE TO SIX, CERVICAL SIX TO SEVEN CERVICAL ANTERIOR DISC ARTHROPLASTY;  Surgeon: Elna Haggis, MD;  Location: [facility];  Service: Neurosurgery;  Laterality: N/A;  C56 C67 artificial disc replacement   CESAREAN SECTION     COLONOSCOPY     DILATATION & CURETTAGE/HYSTEROSCOPY WITH TRUECLEAR N/A 11/25/2013   Procedure: DILATATION & CURETTAGE/HYSTEROSCOPY WITH TRUCLEAR;  Surgeon: Piedad Brewer, MD;  Location: [facility];  Service: Gynecology;  Laterality: N/A;   ECTOPIC PREGNANCY SURGERY     GALLBLADDER SURGERY     MOUTH SURGERY     child- dog bite   SPINAL FUSION     2023   STERIOD INJECTION Left 09/09/2022   Procedure: LEFT KNEE STEROID INJECTION;  Surgeon: Liliane Rei, MD;  Location:  [facility];  Service: Orthopedics;  Laterality: Left;  left knee injection 0928   TONSILLECTOMY     TOTAL KNEE ARTHROPLASTY Right 09/09/2022   Procedure: TOTAL KNEE ARTHROPLASTY;  Surgeon: Liliane Rei, MD;  Location: [facility];  Service: Orthopedics;  Laterality: Right;   TUBAL LIGATION     Patient Active Problem List   Diagnosis Date Noted   OA (osteoarthritis) of knee 09/09/2022   Exertional dyspnea 04/17/2022   Elevated coronary artery calcium  score 04/17/2022   Spondylolisthesis at L4-L5 level 12/27/2021   Ductal carcinoma in situ (DCIS) of left breast 01/19/2018   Generalized anxiety disorder 11/07/2017   Depression 11/07/2017   Insomnia 11/07/2017   Cervical spondylosis 03/19/2013    PCP: Margarete Sharps  REFERRING PROVIDER: Athena Lazier  REFERRING DIAG: R TKA 09/09/22  THERAPY DIAG:  Chronic bilateral back pain, unspecified back location  S/P total knee arthroplasty, right  Stiffness of right knee, not elsewhere classified  Muscle weakness (generalized)  Acute pain of right knee  Localized edema  Rationale for Evaluation and Treatment: Rehabilitation  ONSET DATE: 09/09/22  SUBJECTIVE:   SUBJECTIVE STATEMENT :  I was pretty sore after the other day, but it went away  Saw Dr. Ellery Guthrie last Friday, has a bulging disc on  the right at L2, recommended continuation of PT.  Will have injection in back May 20th R TKA 8/19 L knee steroid injection 09/09/22  PAIN:  Are you having pain? Yes: NPRS scale: 4/10 Pain location: right low back, buttock and reports around to the front of the thigh Pain description: sharp really hurts Aggravating factors: difficulty sleeping, difficulty walking and standing and sitting Relieving factors: heat, Aleve , tramadol  and methacarbomol  PRECAUTIONS: None  RED FLAGS: None   WEIGHT BEARING RESTRICTIONS: No  FALLS:  Has patient fallen in last 6 months? No  LIVING ENVIRONMENT: Lives with: lives with their family and lives with their  spouse Lives in: House/apartment Stairs: Yes: External: 3 steps; none Has following equipment at home: Single point cane and Walker - 2 wheeled  OCCUPATION: Not working  PLOF: Independent  PATIENT GOALS: less back pain  NEXT MD VISIT: 09/24/22  OBJECTIVE:   PATIENT SURVEYS:  FOTO 4  COGNITION: Overall cognitive status: Within functional limits for tasks assessed      EDEMA:  Increased swelling surrounding R knee  POSTURE: rounded shoulders and flexed trunk   PALPATION: Warm and TTP  LOWER EXTREMITY ROM:  Active ROM Right eval Left eval Right 09/24/22 Right 09/26/22 Right 10/02/22  11/01/22 12/09/22  Hip flexion          Hip extension          Hip abduction          Hip adduction          Hip internal rotation          Hip external rotation          Knee flexion  60  75* AROM, 86 PROM 82* AROM seated per pt request  78* self AAROM, 76* AROM  PROM 87 AROM 81 PROM 90 AROM 83 AROM 101 PROM 105  Knee extension -30  -15* AROM seated, -15 PROM supine   -16* supine heel prop with quad set  AROM 25 PROM 16 AROM 14 AROM 5 PROM 0  Ankle dorsiflexion          Ankle plantarflexion          Ankle inversion          Ankle eversion           (Blank rows = not tested)  LOWER EXTREMITY MMT:  MMT Right eval Left eval  Hip flexion 2   Hip extension    Hip abduction    Hip adduction    Hip internal rotation    Hip external rotation    Knee flexion 2   Knee extension 2   Ankle dorsiflexion    Ankle plantarflexion    Ankle inversion    Ankle eversion     (Blank rows = not tested)  LUMBAR ROM:  decreased 50% flexion, decreased 75% for extension and side bending, pain was worse with extension and right side bending. Hooklying is less painful that lying flat on back Mild tightness with pain during HS stretch, tight and painful piriformis bilaterally worse on the right , very weak core  FUNCTIONAL TESTS:  5 times sit to stand: 30 seconds 03/20/23 Timed up and go (TUG): 1  min 28s  GAIT: Is no without assistive device, mild antalgic on the right    TODAY'S TREATMENT:  DATE:  07/10/23 Bike level 5 x 6 minutes 5# straight arm row 20# row 20# lats 10# chest press 5# biceps 20# triceps Feet on ball exercises Passive LE stretches Manual sheet lumbar traction  07/07/23 Bike level 5 x 6 minutes 20# rows 20# lats 20# leg press Feet on ball k2c, rotation, bridge, isometric abs Black tband trunk flexion and extension STM to the low back Manual sheet traction  07/03/23 Bike level 4 x 7 minutes 5# straight arm pulls 15# rows 2x10 15# Lats 2x10 Black tband seated extension Feet on ball K2C, rotation, small bridge, isometric abs Manual sheet traction lumbar  06/30/23 Bike level 4 x 6 minutes 5# straight arm pulls 5# rows 15# lats Feet on ball K2C, rotation, small bridge, isometric abs Passive LE stretch Manual sheet traction  06/24/23 Gait around the back building  Bike level 4 x 5 minutes Feet on ball, K2C, rotation, small bridge, isometric abs Passive stretch LE STM to the low back and right buttock Manual pelvic traction  06/19/23 Gait around the back building Bike level 4 x 5 minutes Feet on ball K2C, rotation, small bridge, isometric abs Blue tband hip extension Blue tband clamshells Small bridges Passive stretch LE's Sheet lumbar traction  06/17/23 Bike level 4 x 6 minutes Back to wall small scapular touches Back to wall weighted ball overhead reach Small standing extension Feet on ball K2C, small rotation, posterior isometrics, ab isometrics Passive stretch LE's Ball b/n knees squeeze Green tband clamshells Blue tband supine hip extension  06/09/23 Bike level 5 x 6 minutes Gait outside around the back building Seated rows 20# Lats 20# Straight arm pulls 5# Feet on ball K2C, rotation, bridge,  isometric abs Passive stretch LE's STM to the Low back  06/04/23 Bike Level 5 x 6 minutes Walk around the parking [place] on airex straight arm pulls 5# 15# seated rows Feet on ball K2C, rotation, small bridge, isometric abs Passive stretch LE's Manual sheet traction STM with the T-gun right low back and right buttock  06/02/23 Bike level 4 x 6 minutes Prone over two pillows x 2 minutes Then in road kill position with right leg ER and out for 2 minutes Passive stretch LE's STM to the right low back and right buttock Manual sheet traction  05/28/23 Bike level 4 x 6 minutes 5# straight arm pulls Feet on ball K2C, rotation, small bridge, isometric abs Passive stretch LE's Sheet traction STM to the right buttock and back  05/26/23 Bike level 5 x 6 minutes 5# straight arm pulls 20# rows 2x10 20# lats 2x10 5# AR press Passive stretch LE's STM to the right buttock, HS and low back Manual sheet traction lumbar   PATIENT EDUCATION:  Education details: POC and HEP Person educated: Patient Education method: Explanation Education comprehension: verbalized understanding  HOME EXERCISE PROGRAM: Access Code: [facility] Access Code: [email or web address] URL: [email or web address] Date: 03/20/2023 Prepared by: Cherylene Corrente  Exercises - Hooklying Single Knee to Chest Stretch  - 2 x daily - 7 x weekly - 1 sets - 10 reps - 5 hold - Supine Double Knee to Chest  - 2 x daily - 7 x weekly - 1 sets - 10 reps - 5 hold - Supine Lower Trunk Rotation with PLB  - 2 x daily - 7 x weekly - 1 sets - 10 reps - 5 hold - Supine Posterior Pelvic Tilt  - 2 x daily - 7 x weekly - 1 sets - 10 reps - 3 hold  ASSESSMENT:  CLINICAL IMPRESSION:   there is a right disc herniation at L2-3.   she reports that she will have breast surgery on June 26th we will have to stop PT, I did add a few exercises today for her arms prior to her surgery next week, she is reporting less and less back pain with our  current plan OBJECTIVE IMPAIRMENTS: Abnormal gait, cardiopulmonary status limiting activity, decreased activity tolerance, decreased endurance, decreased mobility, difficulty walking, decreased ROM, decreased strength, increased muscle spasms, impaired flexibility, improper body mechanics, postural dysfunction, and pain.   REHAB POTENTIAL: Good  CLINICAL DECISION MAKING: Stable/uncomplicated  EVALUATION COMPLEXITY: Low   GOALS: Goals reviewed with patient? Yes  SHORT TERM GOALS: Target date: 10/24/22  Patient will be independent with initial HEP. Goal status: 09/20/22 met  2.  Patient will be do TUG < 30s Baseline: 1 min 28s  Goal status: ongoing 10/07/22, 18.57s MET 10/09/22  3.  Patient will be able to do 5 sit to stands  Baseline: unable to do  Goal status: progressing 10/04/22, MET 10/09/22   LONG TERM GOALS: Target date: 05/18/23  Patient will be independent with advanced/ongoing HEP to improve outcomes and carryover.  Goal status: ongoing 11/06/22  2.  Patient will report at least 75% improvement in R knee pain to improve QOL. Baseline: 10/10 Goal status: met 02/03/23 60% better 11/20/22, 65% 12/06/22, 70% 12/17/22  3.  Patient will demonstrate improved R knee AROM to >/= 0-120 deg to allow for normal gait and stair mechanics. Baseline: 30-0-60 10/09/22: 16-0-78  11/01/22: 14-0-83 11/20/22: 10-97 12/06/22: 10-0-100 12/17/22: 8-0-104 05/13/23: 5-0-115 Goal status: continue to progress and getting closer 0-112  03/13/23. Met 06/19/23  4.  Patient will demonstrate improved functional LE strength as demonstrated by 5/5. Baseline: 2/5 Goal status: MET 3+/5 10/09/22, 5/5 11/01/22   5.  Patient will be able to ambulate 500' with LRAD and normal gait pattern without increased pain to access community.  Baseline: using RW small in home ambulation distances Goal status: progressing w/SPC 12/02/22, doing some walking without AD, MET 05/13/23  6.  Patient will report 38 on FOTO  (patient reported outcome measure) to demonstrate improved functional ability. Baseline: 4, 40-11/11/24 Goal status: MET 53 12/17/22  7.  Decrease lumbar pain 50%  Goal status: ongoing 05/01/23, 45% 05/13/23  8.  Increase lumbar ROM 25%  Goal status: ongoing 04/24/23, ongoing 05/13/23-- extension, L side bending, and R rotation is very limited and painful   9.  Report able to put shoes and socks on without pain > 4/10  progressing 07/03/23  Goal status: ongoing still pain 7/10 05/01/23, MET 05/13/23  PLAN:  PT FREQUENCY: 2x/week  PT DURATION: 12 weeks  PLANNED INTERVENTIONS: Therapeutic exercises, Therapeutic activity, Neuromuscular re-education, Balance training, Gait training, Patient/Family education, Self Care, Joint mobilization, Stair training, Electrical stimulation, Cryotherapy, Moist heat, scar mobilization, Vasopneumatic device, and Manual therapy  PLAN FOR NEXT SESSION: Will continue to add some core stability and overall strength to prepare her for her surgery next week   Patient Details  Name: [name] MRN: [id number] Date of Birth: 09/24/1960 Referring Provider:  Elna Haggis, MD  Encounter Date: 07/10/2023   Hollis Lurie, PT 07/10/2023, 2:06 PM

## 2023-07-14 ENCOUNTER — Ambulatory Visit: Admitting: Professional Counselor

## 2023-07-14 ENCOUNTER — Ambulatory Visit: Admitting: Physical Therapy

## 2023-07-14 ENCOUNTER — Encounter: Payer: Self-pay | Admitting: Professional Counselor

## 2023-07-14 DIAGNOSIS — F411 Generalized anxiety disorder: Secondary | ICD-10-CM

## 2023-07-14 DIAGNOSIS — F330 Major depressive disorder, recurrent, mild: Secondary | ICD-10-CM | POA: Diagnosis not present

## 2023-07-14 NOTE — Progress Notes (Signed)
      [facility] Counselor/Therapist Progress Note  Patient ID: [name], MRN: [id number],    Date: 07/14/2023  Time Spent: 9:08 AM - 10:09 AM   Treatment Type: Family with patient  Patient present with spouse for session.  Reported Symptoms: worries, restlessness, nervousness, intermittent low mood, self esteem concerns, fatigue, interpersonal concerns  Mental Status Exam:  Appearance:   Neat     Behavior:  Appropriate, Sharing, and Motivated  Motor:  Normal  Speech/Language:   Clear and Coherent and Normal Rate  Affect:  Appropriate and Congruent  Mood:  normal  Thought process:  normal  Thought content:    WNL  Sensory/Perceptual disturbances:    WNL  Orientation:  oriented to person, place, time/date, and situation  Attention:  Good  Concentration:  Good  Memory:  WNL  Fund of knowledge:   Good  Insight:    Good  Judgment:   Good  Impulse Control:  Good   Risk Assessment: Danger to Self:  No Self-injurious Behavior: No Danger to Others: No Duty to Warn:no Physical Aggression / Violence:No  Access to Firearms a concern: No  Gang Involvement:No   Subjective: Patient presented to session to address concerns of anxiety and depression.  She reported progress at this time.  She presented to session with her husband.  Patient reported a health update of having a rash, and to be looking forward to resolution of health concerns with pending surgery.  She reflected on her life stressors including health concerns.  She reported marital concerns to have improved with increased lightness, and voiced that faith, forgiveness, regular devotionals, church going, and husband as having adhered to her wishes around substance use as being very meaningful and healing, the substance issue in particular as increasing in her trust levels with spouse.  Counselor actively listened, affirmed patient feelings and experience, celebrated progress for patient and patient and spouse, and  helped to reinforce positive strategies and exercising of letting go of potential tension triggers between couple.  Interventions: Solution-Oriented/Positive Psychology, Humanistic/Existential, Insight-Oriented, and Interpersonal  Diagnosis:   ICD-10-CM   1. Generalized anxiety disorder  F41.1     2. Major depressive disorder, recurrent episode, mild (HCC)  F33.0       Plan: Pt is schedule for a follow-up; continue process work and developing coping skills. STG between sessions to continue devotionals, church-going with spouse, and practicing emotional regulation and interpersonal effectiveness skills, and letting go.   [name], [facility]

## 2023-07-15 ENCOUNTER — Encounter: Payer: Self-pay | Admitting: Physical Therapy

## 2023-07-15 ENCOUNTER — Ambulatory Visit: Admitting: Physical Therapy

## 2023-07-15 DIAGNOSIS — M549 Dorsalgia, unspecified: Secondary | ICD-10-CM | POA: Diagnosis not present

## 2023-07-15 DIAGNOSIS — G8929 Other chronic pain: Secondary | ICD-10-CM

## 2023-07-15 DIAGNOSIS — M25661 Stiffness of right knee, not elsewhere classified: Secondary | ICD-10-CM

## 2023-07-15 DIAGNOSIS — M6281 Muscle weakness (generalized): Secondary | ICD-10-CM

## 2023-07-15 DIAGNOSIS — Z96651 Presence of right artificial knee joint: Secondary | ICD-10-CM

## 2023-07-15 NOTE — Therapy (Signed)
 OUTPATIENT PHYSICAL THERAPY LOWER EXTREMITY TREATMENT    Patient Name: [name] MRN: [id number] DOB:11-Jul-1960, 63 y.o., female Today's Date: 07/15/2023  END OF SESSION:  PT End of Session - 07/15/23 0934     Visit Number 96    Date for PT Re-Evaluation 07/19/23    Authorization Type [employer]    PT Start Time 0928    PT Stop Time 1015    PT Time Calculation (min) 47 min    Activity Tolerance Patient tolerated treatment well    Behavior During Therapy WFL for tasks assessed/performed            Past Medical History:  Diagnosis Date   Arthritis    Back pain of lumbar region with sciatica    Cancer (HCC)    breast   Depression    Diabetes mellitus, type II (HCC)    Family history of anesthesia complication    grandfather died under anesthesia 70's- had heart issues   H/O carpal tunnel repair 2014   Headache(784.0)    Hyperlipidemia    Hypertension    Hypothyroidism    Medial meniscus tear    PONV (postoperative nausea and vomiting)    Sleep apnea    cpap since 10   Past Surgical History:  Procedure Laterality Date   BILATERAL TOTAL MASTECTOMY WITH AXILLARY LYMPH NODE DISSECTION     BREAST RECONSTRUCTION     CERVICAL DISC ARTHROPLASTY N/A 03/19/2013   Procedure: CERVICAL FIVE TO SIX, CERVICAL SIX TO SEVEN CERVICAL ANTERIOR DISC ARTHROPLASTY;  Surgeon: Victory Gens, MD;  Location: [facility];  Service: Neurosurgery;  Laterality: N/A;  C56 C67 artificial disc replacement   CESAREAN SECTION     COLONOSCOPY     DILATATION & CURETTAGE/HYSTEROSCOPY WITH TRUECLEAR N/A 11/25/2013   Procedure: DILATATION & CURETTAGE/HYSTEROSCOPY WITH TRUCLEAR;  Surgeon: Charlie CHRISTELLA Croak, MD;  Location: [facility];  Service: Gynecology;  Laterality: N/A;   ECTOPIC PREGNANCY SURGERY     GALLBLADDER SURGERY     MOUTH SURGERY     child- dog bite   SPINAL FUSION     2023   STERIOD INJECTION Left 09/09/2022   Procedure: LEFT KNEE STEROID INJECTION;  Surgeon: Melodi Lerner, MD;  Location:  [facility];  Service: Orthopedics;  Laterality: Left;  left knee injection 0928   TONSILLECTOMY     TOTAL KNEE ARTHROPLASTY Right 09/09/2022   Procedure: TOTAL KNEE ARTHROPLASTY;  Surgeon: Melodi Lerner, MD;  Location: [facility];  Service: Orthopedics;  Laterality: Right;   TUBAL LIGATION     Patient Active Problem List   Diagnosis Date Noted   OA (osteoarthritis) of knee 09/09/2022   Exertional dyspnea 04/17/2022   Elevated coronary artery calcium  score 04/17/2022   Spondylolisthesis at L4-L5 level 12/27/2021   Ductal carcinoma in situ (DCIS) of left breast 01/19/2018   Generalized anxiety disorder 11/07/2017   Depression 11/07/2017   Insomnia 11/07/2017   Cervical spondylosis 03/19/2013    PCP: Norleen Jungling  REFERRING PROVIDER: Lerner Lawn  REFERRING DIAG: R TKA 09/09/22  THERAPY DIAG:  Chronic bilateral back pain, unspecified back location  S/P total knee arthroplasty, right  Stiffness of right knee, not elsewhere classified  Muscle weakness (generalized)  Rationale for Evaluation and Treatment: Rehabilitation  ONSET DATE: 09/09/22  SUBJECTIVE:   SUBJECTIVE STATEMENT :  Doing okay, nervous about surgery on Thursday.  I need to know what to do about knee an dback  Saw Dr. Gens last Friday, has a bulging disc on the right  at L2, recommended continuation of PT.  Will have injection in back May 20th R TKA 8/19 L knee steroid injection 09/09/22  PAIN:  Are you having pain? Yes: NPRS scale: 4/10 Pain location: right low back, buttock and reports around to the front of the thigh Pain description: sharp really hurts Aggravating factors: difficulty sleeping, difficulty walking and standing and sitting Relieving factors: heat, Aleve , tramadol  and methacarbomol  PRECAUTIONS: None  RED FLAGS: None   WEIGHT BEARING RESTRICTIONS: No  FALLS:  Has patient fallen in last 6 months? No  LIVING ENVIRONMENT: Lives with: lives with their family and lives with their spouse Lives  in: House/apartment Stairs: Yes: External: 3 steps; none Has following equipment at home: Single point cane and Walker - 2 wheeled  OCCUPATION: Not working  PLOF: Independent  PATIENT GOALS: less back pain  NEXT MD VISIT: 09/24/22  OBJECTIVE:   PATIENT SURVEYS:  FOTO 4  COGNITION: Overall cognitive status: Within functional limits for tasks assessed      EDEMA:  Increased swelling surrounding R knee  POSTURE: rounded shoulders and flexed trunk   PALPATION: Warm and TTP  LOWER EXTREMITY ROM:  Active ROM Right eval Left eval Right 09/24/22 Right 09/26/22 Right 10/02/22  11/01/22 12/09/22  Hip flexion          Hip extension          Hip abduction          Hip adduction          Hip internal rotation          Hip external rotation          Knee flexion  60  75* AROM, 86 PROM 82* AROM seated per pt request  78* self AAROM, 76* AROM  PROM 87 AROM 81 PROM 90 AROM 83 AROM 101 PROM 105  Knee extension -30  -15* AROM seated, -15 PROM supine   -16* supine heel prop with quad set  AROM 25 PROM 16 AROM 14 AROM 5 PROM 0  Ankle dorsiflexion          Ankle plantarflexion          Ankle inversion          Ankle eversion           (Blank rows = not tested)  LOWER EXTREMITY MMT:  MMT Right eval Left eval  Hip flexion 2   Hip extension    Hip abduction    Hip adduction    Hip internal rotation    Hip external rotation    Knee flexion 2   Knee extension 2   Ankle dorsiflexion    Ankle plantarflexion    Ankle inversion    Ankle eversion     (Blank rows = not tested)  LUMBAR ROM:  decreased 50% flexion, decreased 75% for extension and side bending, pain was worse with extension and right side bending. Hooklying is less painful that lying flat on back Mild tightness with pain during HS stretch, tight and painful piriformis bilaterally worse on the right , very weak core  FUNCTIONAL TESTS:  5 times sit to stand: 30 seconds 03/20/23 Timed up and go (TUG): 1 min  28s  GAIT: Is no without assistive device, mild antalgic on the right    TODAY'S TREATMENT:  DATE:  07/15/23 Review of HEP for knee and back, performed and gave verbal instruction. Asked her to ask the surgeon if these are appropriate after her surgery, tried to chose things with less stress on the arms Passive stretch of the right LE for knee Passive stretch of both LE's for back Manual sheet traction, educated on how to do at home  07/10/23 Bike level 5 x 6 minutes 5# straight arm row 20# row 20# lats 10# chest press 5# biceps 20# triceps Feet on ball exercises Passive LE stretches Manual sheet lumbar traction  07/07/23 Bike level 5 x 6 minutes 20# rows 20# lats 20# leg press Feet on ball k2c, rotation, bridge, isometric abs Black tband trunk flexion and extension STM to the low back Manual sheet traction  07/03/23 Bike level 4 x 7 minutes 5# straight arm pulls 15# rows 2x10 15# Lats 2x10 Black tband seated extension Feet on ball K2C, rotation, small bridge, isometric abs Manual sheet traction lumbar  06/30/23 Bike level 4 x 6 minutes 5# straight arm pulls 5# rows 15# lats Feet on ball K2C, rotation, small bridge, isometric abs Passive LE stretch Manual sheet traction  06/24/23 Gait around the back building  Bike level 4 x 5 minutes Feet on ball, K2C, rotation, small bridge, isometric abs Passive stretch LE STM to the low back and right buttock Manual pelvic traction  06/19/23 Gait around the back building Bike level 4 x 5 minutes Feet on ball K2C, rotation, small bridge, isometric abs Blue tband hip extension Blue tband clamshells Small bridges Passive stretch LE's Sheet lumbar traction  06/17/23 Bike level 4 x 6 minutes Back to wall small scapular touches Back to wall weighted ball overhead reach Small standing  extension Feet on ball K2C, small rotation, posterior isometrics, ab isometrics Passive stretch LE's Ball b/n knees squeeze Green tband clamshells Blue tband supine hip extension  06/09/23 Bike level 5 x 6 minutes Gait outside around the back building Seated rows 20# Lats 20# Straight arm pulls 5# Feet on ball K2C, rotation, bridge, isometric abs Passive stretch LE's STM to the Low back  06/04/23 Bike Level 5 x 6 minutes Walk around the parking [place] on airex straight arm pulls 5# 15# seated rows Feet on ball K2C, rotation, small bridge, isometric abs Passive stretch LE's Manual sheet traction STM with the T-gun right low back and right buttock  06/02/23 Bike level 4 x 6 minutes Prone over two pillows x 2 minutes Then in road kill position with right leg ER and out for 2 minutes Passive stretch LE's STM to the right low back and right buttock Manual sheet traction  05/28/23 Bike level 4 x 6 minutes 5# straight arm pulls Feet on ball K2C, rotation, small bridge, isometric abs Passive stretch LE's Sheet traction STM to the right buttock and back  05/26/23 Bike level 5 x 6 minutes 5# straight arm pulls 20# rows 2x10 20# lats 2x10 5# AR press Passive stretch LE's STM to the right buttock, HS and low back Manual sheet traction lumbar   PATIENT EDUCATION:  Education details: POC and HEP Person educated: Patient Education method: Explanation Education comprehension: verbalized understanding  HOME EXERCISE PROGRAM: Access Code: [email or web address] Access Code: [email or web address] URL: [email or web address] Date: 03/20/2023 Prepared by: Ozell Mainland  Exercises - Hooklying Single Knee to Chest Stretch  - 2 x daily - 7 x weekly - 1 sets - 10 reps - 5 hold - Supine Double Knee to Chest  - 2 x daily -  7 x weekly - 1 sets - 10 reps - 5 hold - Supine Lower Trunk Rotation with PLB  - 2 x daily - 7 x weekly - 1 sets - 10 reps - 5 hold - Supine Posterior Pelvic Tilt  - 2 x  daily - 7 x weekly - 1 sets - 10 reps - 3 hold  ASSESSMENT:  CLINICAL IMPRESSION:   there is a right disc herniation at L2-3.   she reports that she will have breast surgery on June 26th Today is the last PT appointment, reviewed  all HEP and tried show her how to do sheet traction at home with husband and passive LE stretches, asked her to assure with the breast surgeon what she can and cannot do OBJECTIVE IMPAIRMENTS: Abnormal gait, cardiopulmonary status limiting activity, decreased activity tolerance, decreased endurance, decreased mobility, difficulty walking, decreased ROM, decreased strength, increased muscle spasms, impaired flexibility, improper body mechanics, postural dysfunction, and pain.   REHAB POTENTIAL: Good  CLINICAL DECISION MAKING: Stable/uncomplicated  EVALUATION COMPLEXITY: Low   GOALS: Goals reviewed with patient? Yes  SHORT TERM GOALS: Target date: 10/24/22  Patient will be independent with initial HEP. Goal status: 09/20/22 met  2.  Patient will be do TUG < 30s Baseline: 1 min 28s  Goal status: ongoing 10/07/22, 18.57s MET 10/09/22  3.  Patient will be able to do 5 sit to stands  Baseline: unable to do  Goal status: progressing 10/04/22, MET 10/09/22   LONG TERM GOALS: Target date: 05/18/23  Patient will be independent with advanced/ongoing HEP to improve outcomes and carryover.  Goal status: ongoing 11/06/22  2.  Patient will report at least 75% improvement in R knee pain to improve QOL. Baseline: 10/10 Goal status: met 02/03/23 60% better 11/20/22, 65% 12/06/22, 70% 12/17/22  3.  Patient will demonstrate improved R knee AROM to >/= 0-120 deg to allow for normal gait and stair mechanics. Baseline: 30-0-60 10/09/22: 16-0-78  11/01/22: 14-0-83 11/20/22: 10-97 12/06/22: 10-0-100 12/17/22: 8-0-104 05/13/23: 5-0-115 Goal status: continue to progress and getting closer 0-112  03/13/23. Met 06/19/23  4.  Patient will demonstrate improved functional LE  strength as demonstrated by 5/5. Baseline: 2/5 Goal status: MET 3+/5 10/09/22, 5/5 11/01/22   5.  Patient will be able to ambulate 500' with LRAD and normal gait pattern without increased pain to access community.  Baseline: using RW small in home ambulation distances Goal status: progressing w/SPC 12/02/22, doing some walking without AD, MET 05/13/23  6.  Patient will report 52 on FOTO (patient reported outcome measure) to demonstrate improved functional ability. Baseline: 4, 40-11/11/24 Goal status: MET 53 12/17/22  7.  Decrease lumbar pain 50%  Goal status: ongoing 05/01/23, 45% 05/13/23  8.  Increase lumbar ROM 25%  Goal status: ongoing 04/24/23, ongoing 05/13/23-- extension, L side bending, and R rotation is very limited and painful   9.  Report able to put shoes and socks on without pain > 4/10  progressing 07/03/23  Goal status: ongoing still pain 7/10 05/01/23, MET 05/13/23  PLAN:  PT FREQUENCY: 2x/week  PT DURATION: 12 weeks  PLANNED INTERVENTIONS: Therapeutic exercises, Therapeutic activity, Neuromuscular re-education, Balance training, Gait training, Patient/Family education, Self Care, Joint mobilization, Stair training, Electrical stimulation, Cryotherapy, Moist heat, scar mobilization, Vasopneumatic device, and Manual therapy  PLAN FOR NEXT SESSION: Will D/C as she will have a breast surgery this week, will need a new referral if she is to continue   Patient Details  Name: [name] MRN:  [id number] Date of Birth: 1960/01/29 Referring Provider:  Colon Shove, MD  Encounter Date: 07/15/2023   OBADIAH OZELL ORN, PT 07/15/2023, 9:35 AM

## 2023-07-17 HISTORY — PX: BREAST RECONSTRUCTION: SHX9

## 2023-07-21 ENCOUNTER — Ambulatory Visit: Admitting: Professional Counselor

## 2023-07-29 ENCOUNTER — Other Ambulatory Visit: Payer: Self-pay | Admitting: Cardiology

## 2023-07-29 DIAGNOSIS — R931 Abnormal findings on diagnostic imaging of heart and coronary circulation: Secondary | ICD-10-CM

## 2023-08-13 ENCOUNTER — Ambulatory Visit: Admitting: Professional Counselor

## 2023-08-13 ENCOUNTER — Encounter: Payer: Self-pay | Admitting: Professional Counselor

## 2023-08-13 DIAGNOSIS — F411 Generalized anxiety disorder: Secondary | ICD-10-CM | POA: Diagnosis not present

## 2023-08-13 DIAGNOSIS — F330 Major depressive disorder, recurrent, mild: Secondary | ICD-10-CM | POA: Diagnosis not present

## 2023-08-13 NOTE — Progress Notes (Signed)
      [facility] Counselor/Therapist Progress Note  Patient ID: [name], MRN: [id number],    Date: 08/13/2023  Time Spent: 2:09 PM - 3:06 PM   Treatment Type: Family with patient  Pt present with spouse for session.  Reported Symptoms: worries, anxiousness, nervousness, impulsive spending, fatigue, stress, intermittent low mood, health concerns  Mental Status Exam:  Appearance:   Neat     Behavior:  Appropriate, Sharing, and Motivated  Motor:  Normal  Speech/Language:   Clear and Coherent and Normal Rate  Affect:  Appropriate and Congruent  Mood:  normal  Thought process:  normal  Thought content:    WNL  Sensory/Perceptual disturbances:    WNL  Orientation:  oriented to person, place, time/date, and situation  Attention:  Good  Concentration:  Good  Memory:  WNL  Fund of knowledge:   Good  Insight:    Good  Judgment:   Good  Impulse Control:  Fair   Risk Assessment: Danger to Self:  No Self-injurious Behavior: No Danger to Others: No Duty to Warn:no Physical Aggression / Violence:No  Access to Firearms a concern: No  Gang Involvement:No   Subjective: Patient presented to session to address concerns of anxiety depression.  Patient presented to session with her spouse.  She reported progress at this time.  She reported recent surgery to have gone well.  She reported use of coping skills for emotional regulation to be helping and for conflict between she and husband to have decreased significantly.  She and spouse discussed how they were working to give themselves in 1 another more grace, and that their trust in 1 another was increasing.  She reported continued struggles with overspending urges and tendencies as stress relief.  Counselor affirmed patient progress and helped reinforce CBT cognitive restructuring to encourage patient efforts towards limiting overspending.  Counselor patient and patient's spouse discussed ways of making shopping needs collaborative and  fun, with implementation of guardrails as discussed.  Counselor encouraged continued resourcing of daily devotionals and mindfulness regarding help and positivity toward shared goal of increased marital harmony.  Interventions: Solution-Oriented/Positive Psychology, Humanistic/Existential, Insight-Oriented, and Interpersonal, CBT  Diagnosis:   ICD-10-CM   1. Generalized anxiety disorder  F41.1     2. Major depressive disorder, recurrent episode, mild (HCC)  F33.0       Plan: Pt is scheduled for a follow-up; continue process work and developing coping skills. STG between sessions to continue to practice emotional regulation and interpersonal effectiveness skillset; resource guardrails such as limiting access, unsubscribing from emails, discussing needs/desires with support persons, practicing mental shifts regarding acquiring tendencies; practice humor, levity and collaborative fun around sticking points in marital relationship; continue practice of devotionals with spouse.  Almarie ONEIDA Sprang, [facility]

## 2023-08-21 ENCOUNTER — Ambulatory Visit: Attending: Neurological Surgery | Admitting: Physical Therapy

## 2023-08-21 ENCOUNTER — Encounter: Payer: Self-pay | Admitting: Physical Therapy

## 2023-08-21 DIAGNOSIS — G8929 Other chronic pain: Secondary | ICD-10-CM | POA: Diagnosis present

## 2023-08-21 DIAGNOSIS — Z96651 Presence of right artificial knee joint: Secondary | ICD-10-CM | POA: Diagnosis present

## 2023-08-21 DIAGNOSIS — M6281 Muscle weakness (generalized): Secondary | ICD-10-CM | POA: Insufficient documentation

## 2023-08-21 DIAGNOSIS — M25561 Pain in right knee: Secondary | ICD-10-CM | POA: Diagnosis present

## 2023-08-21 DIAGNOSIS — M549 Dorsalgia, unspecified: Secondary | ICD-10-CM | POA: Insufficient documentation

## 2023-08-21 DIAGNOSIS — M25661 Stiffness of right knee, not elsewhere classified: Secondary | ICD-10-CM | POA: Diagnosis present

## 2023-08-21 NOTE — Therapy (Signed)
 OUTPATIENT PHYSICAL THERAPY LOWER EXTREMITY TREATMENT    Patient Name: [name] MRN: [id number] DOB:27-Jan-1960, 63 y.o., female Today's Date: 08/21/2023  END OF SESSION:  PT End of Session - 08/21/23 1530     Visit Number 1    Date for PT Re-Evaluation 11/21/23    Authorization Type [employer]    PT Start Time 1530    PT Stop Time 1615    PT Time Calculation (min) 45 min    Activity Tolerance Patient tolerated treatment well    Behavior During Therapy WFL for tasks assessed/performed            Past Medical History:  Diagnosis Date   Arthritis    Back pain of lumbar region with sciatica    Cancer (HCC)    breast   Depression    Diabetes mellitus, type II (HCC)    Family history of anesthesia complication    grandfather died under anesthesia 70's- had heart issues   H/O carpal tunnel repair 2014   Headache(784.0)    Hyperlipidemia    Hypertension    Hypothyroidism    Medial meniscus tear    PONV (postoperative nausea and vomiting)    Sleep apnea    cpap since 10   Past Surgical History:  Procedure Laterality Date   BILATERAL TOTAL MASTECTOMY WITH AXILLARY LYMPH NODE DISSECTION     BREAST RECONSTRUCTION     CERVICAL DISC ARTHROPLASTY N/A 03/19/2013   Procedure: CERVICAL FIVE TO SIX, CERVICAL SIX TO SEVEN CERVICAL ANTERIOR DISC ARTHROPLASTY;  Surgeon: Victory Gens, MD;  Location: [facility];  Service: Neurosurgery;  Laterality: N/A;  C56 C67 artificial disc replacement   CESAREAN SECTION     COLONOSCOPY     DILATATION & CURETTAGE/HYSTEROSCOPY WITH TRUECLEAR N/A 11/25/2013   Procedure: DILATATION & CURETTAGE/HYSTEROSCOPY WITH TRUCLEAR;  Surgeon: Charlie CHRISTELLA Croak, MD;  Location: [facility];  Service: Gynecology;  Laterality: N/A;   ECTOPIC PREGNANCY SURGERY     GALLBLADDER SURGERY     MOUTH SURGERY     child- dog bite   SPINAL FUSION     2023   STERIOD INJECTION Left 09/09/2022   Procedure: LEFT KNEE STEROID INJECTION;  Surgeon: Melodi Lerner, MD;  Location:  [facility];  Service: Orthopedics;  Laterality: Left;  left knee injection 0928   TONSILLECTOMY     TOTAL KNEE ARTHROPLASTY Right 09/09/2022   Procedure: TOTAL KNEE ARTHROPLASTY;  Surgeon: Melodi Lerner, MD;  Location: [facility];  Service: Orthopedics;  Laterality: Right;   TUBAL LIGATION     Patient Active Problem List   Diagnosis Date Noted   OA (osteoarthritis) of knee 09/09/2022   Exertional dyspnea 04/17/2022   Elevated coronary artery calcium  score 04/17/2022   Spondylolisthesis at L4-L5 level 12/27/2021   Ductal carcinoma in situ (DCIS) of left breast 01/19/2018   Generalized anxiety disorder 11/07/2017   Depression 11/07/2017   Insomnia 11/07/2017   Cervical spondylosis 03/19/2013    PCP: Norleen Jungling  REFERRING PROVIDER: Lerner Lawn  REFERRING DIAG: R TKA 09/09/22  THERAPY DIAG:  Chronic bilateral back pain, unspecified back location  S/P total knee arthroplasty, right  Stiffness of right knee, not elsewhere classified  Muscle weakness (generalized)  Acute pain of right knee  Rationale for Evaluation and Treatment: Rehabilitation  ONSET DATE: 09/09/22  SUBJECTIVE:   SUBJECTIVE STATEMENT :  Patient had right breast revision on 07/17/23 due to scar encapsulation.  She is returning here due to the back pain.  Reports that she is  doing a little better due to being less active with this surgery  Saw Dr. Colon last Friday, has a bulging disc on the right at L2, recommended continuation of PT.  Will have injection in back May 20th R TKA 8/19 L knee steroid injection 09/09/22  PAIN:  Are you having pain? Yes: NPRS scale: 6/10 Pain location: right low back, buttock and reports around to the front of the thigh Pain description: sharp really hurts, ache Aggravating factors: difficulty sleeping, difficulty walking and standing and sitting pain up to 9/10 trying to clean the house recently Relieving factors: heat, Aleve , tramadol  and methacarbomol, the PT treatment was  helping  PRECAUTIONS: None no baths, no pushing up, no weights with arms  RED FLAGS: None   WEIGHT BEARING RESTRICTIONS: No  FALLS:  Has patient fallen in last 6 months? No  LIVING ENVIRONMENT: Lives with: lives with their family and lives with their spouse Lives in: House/apartment Stairs: Yes: External: 3 steps; none Has following equipment at home: Single point cane and Walker - 2 wheeled  OCCUPATION: Not working  PLOF: Independent  PATIENT GOALS: less back pain  NEXT MD VISIT: none for Dr. Colon  OBJECTIVE:   PATIENT SURVEYS:    COGNITION: Overall cognitive status: Within functional limits for tasks assessed      EDEMA:    POSTURE: rounded shoulders and flexed trunk   PALPATION: Very tender in the right lumbar L2-5, some tenderness in the buttock  LOWER EXTREMITY ROM:  Active ROM Right eval   Right 09/26/22 Right 10/02/22  11/01/22 12/09/22  Hip flexion          Hip extension          Hip abduction          Hip adduction          Hip internal rotation          Hip external rotation          Knee flexion 110    82* AROM seated per pt request  78* self AAROM, 76* AROM  PROM 87 AROM 81 PROM 90 AROM 83 AROM 101 PROM 105  Knee extension 0    -16* supine heel prop with quad set  AROM 25 PROM 16 AROM 14 AROM 5 PROM 0  Ankle dorsiflexion          Ankle plantarflexion          Ankle inversion          Ankle eversion           (Blank rows = not tested)  LOWER EXTREMITY MMT:  MMT Right eval Left eval  Hip flexion 3+   Hip extension    Hip abduction    Hip adduction    Hip internal rotation    Hip external rotation    Knee flexion 4+   Knee extension 4+   Ankle dorsiflexion    Ankle plantarflexion    Ankle inversion    Ankle eversion     (Blank rows = not tested)  LUMBAR ROM:  decreased 25% flexion, decreased 75% for extension and side bending, pain was worse with extension and right side bending. Hooklying is less painful that lying flat on  back Mild tightness with pain during HS stretch, tight and painful piriformis bilaterally worse on the right , very weak core  FUNCTIONAL TESTS:  TUG 18 seconds no device  GAIT: No assistive device, slow mild antalgic on the right  TODAY'S TREATMENT:                                                                                                                              DATE:  PATIENT EDUCATION:  Education details: POC and HEP Person educated: Patient Education method: Explanation Education comprehension: verbalized understanding  HOME EXERCISE PROGRAM: Access Code: [email or web address] Access Code: [email or web address] URL: [email or web address] Date: 08/21/23 Prepared by: Ozell Mainland  Exercises - Hooklying Single Knee to Chest Stretch  - 2 x daily - 7 x weekly - 1 sets - 10 reps - 5 hold - Supine Double Knee to Chest  - 2 x daily - 7 x weekly - 1 sets - 10 reps - 5 hold - Supine Lower Trunk Rotation with PLB  - 2 x daily - 7 x weekly - 1 sets - 10 reps - 5 hold - Supine Posterior Pelvic Tilt  - 2 x daily - 7 x weekly - 1 sets - 10 reps - 3 hold  ASSESSMENT:  CLINICAL IMPRESSION:   Patient has a right disc herniation at L2-3.   She had the right breast surgery June 26, has some limitations with lifting for this.  She is still having some significant low back pain.  Very tender in the right L2 area.  Fair strength.  Has left knee pain and has pain up to 8/10 with walking has a left TKA scheduled for 11/10/23.  She is going to have a grandchild due August 12. OBJECTIVE IMPAIRMENTS: Abnormal gait, cardiopulmonary status limiting activity, decreased activity tolerance, decreased endurance, decreased mobility, difficulty walking, decreased ROM, decreased strength, increased muscle spasms, impaired flexibility, improper body mechanics, postural dysfunction, and pain.   REHAB POTENTIAL: Good  CLINICAL DECISION MAKING: Stable/uncomplicated  EVALUATION COMPLEXITY:  Low   GOALS: Goals reviewed with patient? Yes  SHORT TERM GOALS: Target date: 09/21/23  Patient will be independent with initial HEP. Goal status: initial   LONG TERM GOALS: Target date: 01/21/24  Patient will be independent with advanced/ongoing HEP to improve outcomes and carryover.  Goal status: initial  2.  Patient will report at least 75% improvement in R knee pain to improve QOL. And back pain Baseline: 8/10 Goal status: initial  3.  Patient will increase lumbar ROM 25% Baseline:as noted above Initial  4.  Patient will demonstrate improved functional LE strength as demonstrated by 5/5. Baseline: see above Goal status: initial  5.  Patient will be able to ambulate 1000' without AD and normal gait pattern without increased pain to access community.  Baseline:  Goal status:  initial  6.  Decrease lumbar pain 50%  Goal status: ongoing 05/01/23, 45% 05/13/23  7.  Report able to put shoes and socks on without pain > 4/10  Intial  PLAN:  PT FREQUENCY: 2x/week  PT DURATION: 6 months  PLANNED INTERVENTIONS: Therapeutic exercises, Therapeutic activity, Neuromuscular re-education, Balance training, Gait training, Patient/Family education, Self Care, Joint mobilization, Stair training, Electrical stimulation, Cryotherapy,  Moist heat, scar mobilization, Vasopneumatic device, and Manual therapy  PLAN FOR NEXT SESSION: bec areful with the UE's due to the recent breast surgery   Patient Details  Name: [name] MRN: [id number] Date of Birth: September 17, 1960 Referring Provider:  Colon Shove, MD  Encounter Date: 08/21/2023   OBADIAH OZELL ORN, PT 08/21/2023, 3:37 PM

## 2023-08-22 ENCOUNTER — Ambulatory Visit: Admitting: Professional Counselor

## 2023-08-25 ENCOUNTER — Ambulatory Visit: Attending: Neurological Surgery | Admitting: Physical Therapy

## 2023-08-25 ENCOUNTER — Encounter: Payer: Self-pay | Admitting: Physical Therapy

## 2023-08-25 DIAGNOSIS — Z96651 Presence of right artificial knee joint: Secondary | ICD-10-CM | POA: Diagnosis present

## 2023-08-25 DIAGNOSIS — R2689 Other abnormalities of gait and mobility: Secondary | ICD-10-CM | POA: Diagnosis present

## 2023-08-25 DIAGNOSIS — M549 Dorsalgia, unspecified: Secondary | ICD-10-CM | POA: Diagnosis present

## 2023-08-25 DIAGNOSIS — M6281 Muscle weakness (generalized): Secondary | ICD-10-CM | POA: Insufficient documentation

## 2023-08-25 DIAGNOSIS — M25661 Stiffness of right knee, not elsewhere classified: Secondary | ICD-10-CM | POA: Insufficient documentation

## 2023-08-25 DIAGNOSIS — R600 Localized edema: Secondary | ICD-10-CM | POA: Diagnosis present

## 2023-08-25 DIAGNOSIS — G8929 Other chronic pain: Secondary | ICD-10-CM | POA: Diagnosis present

## 2023-08-25 DIAGNOSIS — M25561 Pain in right knee: Secondary | ICD-10-CM | POA: Diagnosis present

## 2023-08-25 NOTE — Therapy (Signed)
 OUTPATIENT PHYSICAL THERAPY LOWER EXTREMITY TREATMENT    Patient Name: [name] MRN: [id number] DOB:16-Jul-1960, 63 y.o., female Today's Date: 08/25/2023  END OF SESSION:  PT End of Session - 08/25/23 1448     Visit Number 2    Date for PT Re-Evaluation 11/21/23    Authorization Type [employer]    PT Start Time 1445    PT Stop Time 1530    PT Time Calculation (min) 45 min    Activity Tolerance Patient tolerated treatment well    Behavior During Therapy WFL for tasks assessed/performed            Past Medical History:  Diagnosis Date   Arthritis    Back pain of lumbar region with sciatica    Cancer (HCC)    breast   Depression    Diabetes mellitus, type II (HCC)    Family history of anesthesia complication    grandfather died under anesthesia 70's- had heart issues   H/O carpal tunnel repair 2014   Headache(784.0)    Hyperlipidemia    Hypertension    Hypothyroidism    Medial meniscus tear    PONV (postoperative nausea and vomiting)    Sleep apnea    cpap since 10   Past Surgical History:  Procedure Laterality Date   BILATERAL TOTAL MASTECTOMY WITH AXILLARY LYMPH NODE DISSECTION     BREAST RECONSTRUCTION     CERVICAL DISC ARTHROPLASTY N/A 03/19/2013   Procedure: CERVICAL FIVE TO SIX, CERVICAL SIX TO SEVEN CERVICAL ANTERIOR DISC ARTHROPLASTY;  Surgeon: Victory Gens, MD;  Location: [facility];  Service: Neurosurgery;  Laterality: N/A;  C56 C67 artificial disc replacement   CESAREAN SECTION     COLONOSCOPY     DILATATION & CURETTAGE/HYSTEROSCOPY WITH TRUECLEAR N/A 11/25/2013   Procedure: DILATATION & CURETTAGE/HYSTEROSCOPY WITH TRUCLEAR;  Surgeon: Charlie CHRISTELLA Croak, MD;  Location: [facility];  Service: Gynecology;  Laterality: N/A;   ECTOPIC PREGNANCY SURGERY     GALLBLADDER SURGERY     MOUTH SURGERY     child- dog bite   SPINAL FUSION     2023   STERIOD INJECTION Left 09/09/2022   Procedure: LEFT KNEE STEROID INJECTION;  Surgeon: Melodi Lerner, MD;  Location: [facility];  Service: Orthopedics;  Laterality: Left;  left knee injection 0928   TONSILLECTOMY     TOTAL KNEE ARTHROPLASTY Right 09/09/2022   Procedure: TOTAL KNEE ARTHROPLASTY;  Surgeon: Melodi Lerner, MD;  Location: [facility];  Service: Orthopedics;  Laterality: Right;   TUBAL LIGATION     Patient Active Problem List   Diagnosis Date Noted   OA (osteoarthritis) of knee 09/09/2022   Exertional dyspnea 04/17/2022   Elevated coronary artery calcium  score 04/17/2022   Spondylolisthesis at L4-L5 level 12/27/2021   Ductal carcinoma in situ (DCIS) of left breast 01/19/2018   Generalized anxiety disorder 11/07/2017   Depression 11/07/2017   Insomnia 11/07/2017   Cervical spondylosis 03/19/2013    PCP: Norleen Jungling  REFERRING PROVIDER: Lerner Lawn  REFERRING DIAG: R TKA 09/09/22  THERAPY DIAG:  Chronic bilateral back pain, unspecified back location  S/P total knee arthroplasty, right  Stiffness of right knee, not elsewhere classified  Muscle weakness (generalized)  Acute pain of right knee  Rationale for Evaluation and Treatment: Rehabilitation  ONSET DATE: 09/09/22  SUBJECTIVE:   SUBJECTIVE STATEMENT :  Patient reports that she was pretty sore after the last visit, but resolved  Patient had right breast revision on 07/17/23 due to scar encapsulation.  She is returning here due to the back pain.  Reports that she is doing a little better due to being less active with this surgery  Saw Dr. Colon last Friday, has a bulging disc on the right at L2, recommended continuation of PT.  Will have injection in back May 20th R TKA 8/19 L knee steroid injection 09/09/22  PAIN:  Are you having pain? Yes: NPRS scale: 6/10 Pain location: right low back, buttock and reports around to the front of the thigh Pain description: sharp really hurts, ache Aggravating factors: difficulty sleeping, difficulty walking and standing and sitting pain up to 9/10 trying to clean the house recently Relieving  factors: heat, Aleve , tramadol  and methacarbomol, the PT treatment was helping  PRECAUTIONS: None no baths, no pushing up, no weights with arms  RED FLAGS: None   WEIGHT BEARING RESTRICTIONS: No  FALLS:  Has patient fallen in last 6 months? No  LIVING ENVIRONMENT: Lives with: lives with their family and lives with their spouse Lives in: House/apartment Stairs: Yes: External: 3 steps; none Has following equipment at home: Single point cane and Walker - 2 wheeled  OCCUPATION: Not working  PLOF: Independent  PATIENT GOALS: less back pain  NEXT MD VISIT: none for Dr. Colon  OBJECTIVE:   PATIENT SURVEYS:    COGNITION: Overall cognitive status: Within functional limits for tasks assessed      EDEMA:    POSTURE: rounded shoulders and flexed trunk   PALPATION: Very tender in the right lumbar L2-5, some tenderness in the buttock  LOWER EXTREMITY ROM:  Active ROM Right eval   Right 09/26/22 Right 10/02/22  11/01/22 12/09/22  Hip flexion          Hip extension          Hip abduction          Hip adduction          Hip internal rotation          Hip external rotation          Knee flexion 110    82* AROM seated per pt request  78* self AAROM, 76* AROM  PROM 87 AROM 81 PROM 90 AROM 83 AROM 101 PROM 105  Knee extension 0    -16* supine heel prop with quad set  AROM 25 PROM 16 AROM 14 AROM 5 PROM 0  Ankle dorsiflexion          Ankle plantarflexion          Ankle inversion          Ankle eversion           (Blank rows = not tested)  LOWER EXTREMITY MMT:  MMT Right eval Left eval  Hip flexion 3+   Hip extension    Hip abduction    Hip adduction    Hip internal rotation    Hip external rotation    Knee flexion 4+   Knee extension 4+   Ankle dorsiflexion    Ankle plantarflexion    Ankle inversion    Ankle eversion     (Blank rows = not tested)  LUMBAR ROM:  decreased 25% flexion, decreased 75% for extension and side bending, pain was worse with  extension and right side bending. Hooklying is less painful that lying flat on back Mild tightness with pain during HS stretch, tight and painful piriformis bilaterally worse on the right , very weak core  FUNCTIONAL TESTS:  TUG 18 seconds no  device  GAIT: No assistive device, slow mild antalgic on the right    TODAY'S TREATMENT:                                                                                                                              DATE:  08/25/23 Bike level 4 x 8 minutes Walk outside good pace, SOB and a limp on the left Feet on ball K2C, rotation, bridge, pelvic tilts needed a lot of verbal and tactile cues to get PPT Passive stretch LE's, calves are very tight calves, went over the stretch for home Manual sheet pelvic traction  08/21/23 Evaluation  PATIENT EDUCATION:  Education details: POC and HEP Person educated: Patient Education method: Explanation Education comprehension: verbalized understanding  HOME EXERCISE PROGRAM: Access Code: [facility] Access Code: [email or web address] URL: [email or web address] Date: 08/21/23 Prepared by: Ozell Mainland  Exercises - Hooklying Single Knee to Chest Stretch  - 2 x daily - 7 x weekly - 1 sets - 10 reps - 5 hold - Supine Double Knee to Chest  - 2 x daily - 7 x weekly - 1 sets - 10 reps - 5 hold - Supine Lower Trunk Rotation with PLB  - 2 x daily - 7 x weekly - 1 sets - 10 reps - 5 hold - Supine Posterior Pelvic Tilt  - 2 x daily - 7 x weekly - 1 sets - 10 reps - 3 hold  ASSESSMENT:  CLINICAL IMPRESSION:   Patient was very sore after the evaluation, she felt like it was just she had not done much in a while, with the walk outside she had a limp on the left due to knee and was SOB.  She has very tight calves so tried to address this, she also needed a lot of tactile cues to get pelvic tilt  Patient has a right disc herniation at L2-3.   She had the right breast surgery June 26, has some limitations with  lifting for this.  She is still having some significant low back pain.  Very tender in the right L2 area.  Fair strength.  Has left knee pain and has pain up to 8/10 with walking has a left TKA scheduled for 11/10/23.  She is going to have a grandchild due August 12. OBJECTIVE IMPAIRMENTS: Abnormal gait, cardiopulmonary status limiting activity, decreased activity tolerance, decreased endurance, decreased mobility, difficulty walking, decreased ROM, decreased strength, increased muscle spasms, impaired flexibility, improper body mechanics, postural dysfunction, and pain.   REHAB POTENTIAL: Good  CLINICAL DECISION MAKING: Stable/uncomplicated  EVALUATION COMPLEXITY: Low   GOALS: Goals reviewed with patient? Yes  SHORT TERM GOALS: Target date: 09/21/23  Patient will be independent with initial HEP. Goal status: initial   LONG TERM GOALS: Target date: 01/21/24  Patient will be independent with advanced/ongoing HEP to improve outcomes and carryover.  Goal status: initial  2.  Patient will report at least 75% improvement in R knee pain to improve QOL.  And back pain Baseline: 8/10 Goal status: initial  3.  Patient will increase lumbar ROM 25% Baseline:as noted above Initial  4.  Patient will demonstrate improved functional LE strength as demonstrated by 5/5. Baseline: see above Goal status: initial  5.  Patient will be able to ambulate 1000' without AD and normal gait pattern without increased pain to access community.  Baseline:  Goal status:  initial  6.  Decrease lumbar pain 50%  Goal status: ongoing 05/01/23, 45% 05/13/23  7.  Report able to put shoes and socks on without pain > 4/10  Intial  PLAN:  PT FREQUENCY: 2x/week  PT DURATION: 6 months  PLANNED INTERVENTIONS: Therapeutic exercises, Therapeutic activity, Neuromuscular re-education, Balance training, Gait training, Patient/Family education, Self Care, Joint mobilization, Stair training, Electrical stimulation,  Cryotherapy, Moist heat, scar mobilization, Vasopneumatic device, and Manual therapy  PLAN FOR NEXT SESSION: bec careful with the UE's due to the recent breast surgery   Patient Details  Name: JESSEE NEWNAM MRN: [id number] Date of Birth: 07-17-1960 Referring Provider:  Colon Shove, MD  Encounter Date: 08/25/2023   OBADIAH OZELL ORN, PT 08/25/2023, 2:49 PM

## 2023-08-28 ENCOUNTER — Ambulatory Visit: Admitting: Physical Therapy

## 2023-08-28 ENCOUNTER — Encounter: Payer: Self-pay | Admitting: Physical Therapy

## 2023-08-28 DIAGNOSIS — M25661 Stiffness of right knee, not elsewhere classified: Secondary | ICD-10-CM

## 2023-08-28 DIAGNOSIS — G8929 Other chronic pain: Secondary | ICD-10-CM

## 2023-08-28 DIAGNOSIS — M25561 Pain in right knee: Secondary | ICD-10-CM

## 2023-08-28 DIAGNOSIS — M549 Dorsalgia, unspecified: Secondary | ICD-10-CM | POA: Diagnosis not present

## 2023-08-28 DIAGNOSIS — Z96651 Presence of right artificial knee joint: Secondary | ICD-10-CM

## 2023-08-28 DIAGNOSIS — M6281 Muscle weakness (generalized): Secondary | ICD-10-CM

## 2023-08-28 NOTE — Therapy (Signed)
 OUTPATIENT PHYSICAL THERAPY LOWER EXTREMITY TREATMENT    Patient Name: AURELIE Moore MRN: [id number] DOB:1960-04-13, 63 y.o., female Today's Date: 08/28/2023  END OF SESSION:  PT End of Session - 08/28/23 1403     Visit Number 3    Date for PT Re-Evaluation 11/21/23    Authorization Type [employer]    PT Start Time 1400    PT Stop Time 1445    PT Time Calculation (min) 45 min    Activity Tolerance Patient tolerated treatment well    Behavior During Therapy WFL for tasks assessed/performed            Past Medical History:  Diagnosis Date   Arthritis    Back pain of lumbar region with sciatica    Cancer (HCC)    breast   Depression    Diabetes mellitus, type II (HCC)    Family history of anesthesia complication    grandfather died under anesthesia 70's- had heart issues   H/O carpal tunnel repair 2014   Headache(784.0)    Hyperlipidemia    Hypertension    Hypothyroidism    Medial meniscus tear    PONV (postoperative nausea and vomiting)    Sleep apnea    cpap since 10   Past Surgical History:  Procedure Laterality Date   BILATERAL TOTAL MASTECTOMY WITH AXILLARY LYMPH NODE DISSECTION     BREAST RECONSTRUCTION     CERVICAL DISC ARTHROPLASTY N/A 03/19/2013   Procedure: CERVICAL FIVE TO SIX, CERVICAL SIX TO SEVEN CERVICAL ANTERIOR DISC ARTHROPLASTY;  Surgeon: Victory Gens, MD;  Location: [facility];  Service: Neurosurgery;  Laterality: N/A;  C56 C67 artificial disc replacement   CESAREAN SECTION     COLONOSCOPY     DILATATION & CURETTAGE/HYSTEROSCOPY WITH TRUECLEAR N/A 11/25/2013   Procedure: DILATATION & CURETTAGE/HYSTEROSCOPY WITH TRUCLEAR;  Surgeon: Charlie CHRISTELLA Croak, MD;  Location: [facility];  Service: Gynecology;  Laterality: N/A;   ECTOPIC PREGNANCY SURGERY     GALLBLADDER SURGERY     MOUTH SURGERY     child- dog bite   SPINAL FUSION     2023   STERIOD INJECTION Left 09/09/2022   Procedure: LEFT KNEE STEROID INJECTION;  Surgeon: Melodi Lerner, MD;  Location: [facility];  Service: Orthopedics;  Laterality: Left;  left knee injection 0928   TONSILLECTOMY     TOTAL KNEE ARTHROPLASTY Right 09/09/2022   Procedure: TOTAL KNEE ARTHROPLASTY;  Surgeon: Melodi Lerner, MD;  Location: [facility];  Service: Orthopedics;  Laterality: Right;   TUBAL LIGATION     Patient Active Problem List   Diagnosis Date Noted   OA (osteoarthritis) of knee 09/09/2022   Exertional dyspnea 04/17/2022   Elevated coronary artery calcium  score 04/17/2022   Spondylolisthesis at L4-L5 level 12/27/2021   Ductal carcinoma in situ (DCIS) of left breast 01/19/2018   Generalized anxiety disorder 11/07/2017   Depression 11/07/2017   Insomnia 11/07/2017   Cervical spondylosis 03/19/2013    PCP: Norleen Jungling  REFERRING PROVIDER: Lerner Lawn  REFERRING DIAG: R TKA 09/09/22  THERAPY DIAG:  Chronic bilateral back pain, unspecified back location  S/P total knee arthroplasty, right  Stiffness of right knee, not elsewhere classified  Muscle weakness (generalized)  Acute pain of right knee  Rationale for Evaluation and Treatment: Rehabilitation  ONSET DATE: 09/09/22  SUBJECTIVE:   SUBJECTIVE STATEMENT :  I am a little stressed the insurance denied my surgery, I have been on the phone with them all morning  Patient had right breast revision  on 07/17/23 due to scar encapsulation.  She is returning here due to the back pain.  Reports that she is doing a little better due to being less active with this surgery  Saw Dr. Colon last Friday, has a bulging disc on the right at L2, recommended continuation of PT.  Will have injection in back May 20th R TKA 8/19 L knee steroid injection 09/09/22  PAIN:  Are you having pain? Yes: NPRS scale: 6/10 Pain location: right low back, buttock and reports around to the front of the thigh Pain description: sharp really hurts, ache Aggravating factors: difficulty sleeping, difficulty walking and standing and sitting pain up to 9/10 trying to clean the  house recently Relieving factors: heat, Aleve , tramadol  and methacarbomol, the PT treatment was helping  PRECAUTIONS: None no baths, no pushing up, no weights with arms  RED FLAGS: None   WEIGHT BEARING RESTRICTIONS: No  FALLS:  Has patient fallen in last 6 months? No  LIVING ENVIRONMENT: Lives with: lives with their family and lives with their spouse Lives in: House/apartment Stairs: Yes: External: 3 steps; none Has following equipment at home: Single point cane and Walker - 2 wheeled  OCCUPATION: Not working  PLOF: Independent  PATIENT GOALS: less back pain  NEXT MD VISIT: none for Dr. Colon  OBJECTIVE:   PATIENT SURVEYS:    COGNITION: Overall cognitive status: Within functional limits for tasks assessed      EDEMA:    POSTURE: rounded shoulders and flexed trunk   PALPATION: Very tender in the right lumbar L2-5, some tenderness in the buttock  LOWER EXTREMITY ROM:  Active ROM Right eval   Right 09/26/22 Right 10/02/22  11/01/22 12/09/22  Hip flexion          Hip extension          Hip abduction          Hip adduction          Hip internal rotation          Hip external rotation          Knee flexion 110    82* AROM seated per pt request  78* self AAROM, 76* AROM  PROM 87 AROM 81 PROM 90 AROM 83 AROM 101 PROM 105  Knee extension 0    -16* supine heel prop with quad set  AROM 25 PROM 16 AROM 14 AROM 5 PROM 0  Ankle dorsiflexion          Ankle plantarflexion          Ankle inversion          Ankle eversion           (Blank rows = not tested)  LOWER EXTREMITY MMT:  MMT Right eval Left eval  Hip flexion 3+   Hip extension    Hip abduction    Hip adduction    Hip internal rotation    Hip external rotation    Knee flexion 4+   Knee extension 4+   Ankle dorsiflexion    Ankle plantarflexion    Ankle inversion    Ankle eversion     (Blank rows = not tested)  LUMBAR ROM:  decreased 25% flexion, decreased 75% for extension and side bending,  pain was worse with extension and right side bending. Hooklying is less painful that lying flat on back Mild tightness with pain during HS stretch, tight and painful piriformis bilaterally worse on the right , very weak core  FUNCTIONAL TESTS:  TUG 18 seconds no device  GAIT: No assistive device, slow mild antalgic on the right    TODAY'S TREATMENT:                                                                                                                              DATE:  08/28/23 Bike level 4 x 8 minutes Leg curls 25# Leg extension 5# Feet on ball K2C, rotation, bridges, PPT Green tband clamshells Ball b/n knees small bridge STM to the buttock and low back Manual sheet traction  08/25/23 Bike level 4 x 8 minutes Walk outside good pace, SOB and a limp on the left Feet on ball K2C, rotation, bridge, pelvic tilts needed a lot of verbal and tactile cues to get PPT Passive stretch LE's, calves are very tight calves, went over the stretch for home Manual sheet pelvic traction  08/21/23 Evaluation  PATIENT EDUCATION:  Education details: POC and HEP Person educated: Patient Education method: Explanation Education comprehension: verbalized understanding  HOME EXERCISE PROGRAM: Access Code: [facility] Access Code: [email or web address] URL: [email or web address] Date: 08/21/23 Prepared by: Ozell Mainland  Exercises - Hooklying Single Knee to Chest Stretch  - 2 x daily - 7 x weekly - 1 sets - 10 reps - 5 hold - Supine Double Knee to Chest  - 2 x daily - 7 x weekly - 1 sets - 10 reps - 5 hold - Supine Lower Trunk Rotation with PLB  - 2 x daily - 7 x weekly - 1 sets - 10 reps - 5 hold - Supine Posterior Pelvic Tilt  - 2 x daily - 7 x weekly - 1 sets - 10 reps - 3 hold  ASSESSMENT:  CLINICAL IMPRESSION:   Patient is scheduled for left TKA on 10/15/23, she just underwent right breast surgery so we are limited to some extent, we are working on back pain now but again due to  the knee issues she is walking with a limp at times which stresses the back, I am trying to avoid use of arms until she sees the breast surgeon in a few weeks  Patient has a right disc herniation at L2-3.   She had the right breast surgery June 26, has some limitations with lifting for this.  She is still having some significant low back pain.  Very tender in the right L2 area.  Fair strength.  Has left knee pain and has pain up to 8/10 with walking has a left TKA scheduled for 11/10/23.  She is going to have a grandchild due August 12. OBJECTIVE IMPAIRMENTS: Abnormal gait, cardiopulmonary status limiting activity, decreased activity tolerance, decreased endurance, decreased mobility, difficulty walking, decreased ROM, decreased strength, increased muscle spasms, impaired flexibility, improper body mechanics, postural dysfunction, and pain.   REHAB POTENTIAL: Good  CLINICAL DECISION MAKING: Stable/uncomplicated  EVALUATION COMPLEXITY: Low   GOALS: Goals reviewed with patient? Yes  SHORT TERM GOALS: Target date: 09/21/23  Patient will  be independent with initial HEP. Goal status: initial   LONG TERM GOALS: Target date: 01/21/24  Patient will be independent with advanced/ongoing HEP to improve outcomes and carryover.  Goal status: initial  2.  Patient will report at least 75% improvement in R knee pain to improve QOL. And back pain Baseline: 8/10 Goal status: initial  3.  Patient will increase lumbar ROM 25% Baseline:as noted above Initial  4.  Patient will demonstrate improved functional LE strength as demonstrated by 5/5. Baseline: see above Goal status: initial  5.  Patient will be able to ambulate 1000' without AD and normal gait pattern without increased pain to access community.  Baseline:  Goal status: ongoing 08/28/23  6.  Decrease lumbar pain 50%  Goal status: ongoing 05/01/23, 45% 05/13/23  7.  Report able to put shoes and socks on without pain > 4/10  Progressing  08/28/23  PLAN:  PT FREQUENCY: 2x/week  PT DURATION: 6 months  PLANNED INTERVENTIONS: Therapeutic exercises, Therapeutic activity, Neuromuscular re-education, Balance training, Gait training, Patient/Family education, Self Care, Joint mobilization, Stair training, Electrical stimulation, Cryotherapy, Moist heat, scar mobilization, Vasopneumatic device, and Manual therapy  PLAN FOR NEXT SESSION: bec careful with the UE's due to the recent breast surgery   Patient Details  Name: SHIKHA BIBB MRN: [id number] Date of Birth: [birth year] Referring Provider:  Colon Shove, MD  Encounter Date: 08/28/2023   OBADIAH OZELL ORN, PT 08/28/2023, 2:04 PM

## 2023-08-29 ENCOUNTER — Ambulatory Visit: Admitting: Professional Counselor

## 2023-09-01 ENCOUNTER — Ambulatory Visit: Admitting: Physical Therapy

## 2023-09-01 ENCOUNTER — Encounter: Payer: Self-pay | Admitting: Physical Therapy

## 2023-09-01 DIAGNOSIS — M6281 Muscle weakness (generalized): Secondary | ICD-10-CM

## 2023-09-01 DIAGNOSIS — M549 Dorsalgia, unspecified: Secondary | ICD-10-CM | POA: Diagnosis not present

## 2023-09-01 DIAGNOSIS — G8929 Other chronic pain: Secondary | ICD-10-CM

## 2023-09-01 DIAGNOSIS — Z96651 Presence of right artificial knee joint: Secondary | ICD-10-CM

## 2023-09-01 DIAGNOSIS — M25561 Pain in right knee: Secondary | ICD-10-CM

## 2023-09-01 DIAGNOSIS — M25661 Stiffness of right knee, not elsewhere classified: Secondary | ICD-10-CM

## 2023-09-01 DIAGNOSIS — R600 Localized edema: Secondary | ICD-10-CM

## 2023-09-01 DIAGNOSIS — R2689 Other abnormalities of gait and mobility: Secondary | ICD-10-CM

## 2023-09-01 NOTE — Therapy (Signed)
 OUTPATIENT PHYSICAL THERAPY LOWER EXTREMITY TREATMENT    Patient Name: [name] MRN: [id number] DOB:1960/04/18, 63 y.o., female Today's Date: 09/01/2023  END OF SESSION:  PT End of Session - 09/01/23 1403     Visit Number 4    Date for PT Re-Evaluation 11/21/23    Authorization Type [employer]    PT Start Time 1400    PT Stop Time 1445    PT Time Calculation (min) 45 min    Activity Tolerance Patient tolerated treatment well    Behavior During Therapy WFL for tasks assessed/performed            Past Medical History:  Diagnosis Date   Arthritis    Back pain of lumbar region with sciatica    Cancer (HCC)    breast   Depression    Diabetes mellitus, type II (HCC)    Family history of anesthesia complication    grandfather died under anesthesia 70's- had heart issues   H/O carpal tunnel repair 2014   Headache(784.0)    Hyperlipidemia    Hypertension    Hypothyroidism    Medial meniscus tear    PONV (postoperative nausea and vomiting)    Sleep apnea    cpap since 10   Past Surgical History:  Procedure Laterality Date   BILATERAL TOTAL MASTECTOMY WITH AXILLARY LYMPH NODE DISSECTION     BREAST RECONSTRUCTION     CERVICAL DISC ARTHROPLASTY N/A 03/19/2013   Procedure: CERVICAL FIVE TO SIX, CERVICAL SIX TO SEVEN CERVICAL ANTERIOR DISC ARTHROPLASTY;  Surgeon: Victory Gens, MD;  Location: [facility];  Service: Neurosurgery;  Laterality: N/A;  C56 C67 artificial disc replacement   CESAREAN SECTION     COLONOSCOPY     DILATATION & CURETTAGE/HYSTEROSCOPY WITH TRUECLEAR N/A 11/25/2013   Procedure: DILATATION & CURETTAGE/HYSTEROSCOPY WITH TRUCLEAR;  Surgeon: Charlie CHRISTELLA Croak, MD;  Location: [facility];  Service: Gynecology;  Laterality: N/A;   ECTOPIC PREGNANCY SURGERY     GALLBLADDER SURGERY     MOUTH SURGERY     child- dog bite   SPINAL FUSION     2023   STERIOD INJECTION Left 09/09/2022   Procedure: LEFT KNEE STEROID INJECTION;  Surgeon: Melodi Lerner, MD;  Location:  [facility];  Service: Orthopedics;  Laterality: Left;  left knee injection 0928   TONSILLECTOMY     TOTAL KNEE ARTHROPLASTY Right 09/09/2022   Procedure: TOTAL KNEE ARTHROPLASTY;  Surgeon: Melodi Lerner, MD;  Location: [facility];  Service: Orthopedics;  Laterality: Right;   TUBAL LIGATION     Patient Active Problem List   Diagnosis Date Noted   OA (osteoarthritis) of knee 09/09/2022   Exertional dyspnea 04/17/2022   Elevated coronary artery calcium  score 04/17/2022   Spondylolisthesis at L4-L5 level 12/27/2021   Ductal carcinoma in situ (DCIS) of left breast 01/19/2018   Generalized anxiety disorder 11/07/2017   Depression 11/07/2017   Insomnia 11/07/2017   Cervical spondylosis 03/19/2013    PCP: Norleen Jungling  REFERRING PROVIDER: Lerner Lawn  REFERRING DIAG: R TKA 09/09/22  THERAPY DIAG:  Chronic bilateral back pain, unspecified back location  S/P total knee arthroplasty, right  Stiffness of right knee, not elsewhere classified  Muscle weakness (generalized)  Acute pain of right knee  Localized edema  Other abnormalities of gait and mobility  Rationale for Evaluation and Treatment: Rehabilitation  ONSET DATE: 09/09/22  SUBJECTIVE:   SUBJECTIVE STATEMENT :  I am doing okay, I have been doing a lot going through things for my first  grandchild  Patient had right breast revision on 07/17/23 due to scar encapsulation.  She is returning here due to the back pain.  Reports that she is doing a little better due to being less active with this surgery  Saw Dr. Colon last Friday, has a bulging disc on the right at L2, recommended continuation of PT.  Will have injection in back May 20th R TKA 8/19 L knee steroid injection 09/09/22  PAIN:  Are you having pain? Yes: NPRS scale: 6/10 Pain location: right low back, buttock and reports around to the front of the thigh Pain description: sharp really hurts, ache Aggravating factors: difficulty sleeping, difficulty walking and standing  and sitting pain up to 9/10 trying to clean the house recently Relieving factors: heat, Aleve , tramadol  and methacarbomol, the PT treatment was helping  PRECAUTIONS: None no baths, no pushing up, no weights with arms  RED FLAGS: None   WEIGHT BEARING RESTRICTIONS: No  FALLS:  Has patient fallen in last 6 months? No  LIVING ENVIRONMENT: Lives with: lives with their family and lives with their spouse Lives in: House/apartment Stairs: Yes: External: 3 steps; none Has following equipment at home: Single point cane and Walker - 2 wheeled  OCCUPATION: Not working  PLOF: Independent  PATIENT GOALS: less back pain  NEXT MD VISIT: none for Dr. Colon  OBJECTIVE:   PATIENT SURVEYS:    COGNITION: Overall cognitive status: Within functional limits for tasks assessed      EDEMA:    POSTURE: rounded shoulders and flexed trunk   PALPATION: Very tender in the right lumbar L2-5, some tenderness in the buttock  LOWER EXTREMITY ROM:  Active ROM Right eval   Right 09/26/22 Right 10/02/22  11/01/22 12/09/22  Hip flexion          Hip extension          Hip abduction          Hip adduction          Hip internal rotation          Hip external rotation          Knee flexion 110    82* AROM seated per pt request  78* self AAROM, 76* AROM  PROM 87 AROM 81 PROM 90 AROM 83 AROM 101 PROM 105  Knee extension 0    -16* supine heel prop with quad set  AROM 25 PROM 16 AROM 14 AROM 5 PROM 0  Ankle dorsiflexion          Ankle plantarflexion          Ankle inversion          Ankle eversion           (Blank rows = not tested)  LOWER EXTREMITY MMT:  MMT Right eval Left eval  Hip flexion 3+   Hip extension    Hip abduction    Hip adduction    Hip internal rotation    Hip external rotation    Knee flexion 4+   Knee extension 4+   Ankle dorsiflexion    Ankle plantarflexion    Ankle inversion    Ankle eversion     (Blank rows = not tested)  LUMBAR ROM:  decreased 25% flexion,  decreased 75% for extension and side bending, pain was worse with extension and right side bending. Hooklying is less painful that lying flat on back Mild tightness with pain during HS stretch, tight and painful piriformis bilaterally worse on  the right , very weak core  FUNCTIONAL TESTS:  TUG 18 seconds no device  GAIT: No assistive device, slow mild antalgic on the right    TODAY'S TREATMENT:                                                                                                                              DATE:  09/01/23 Bike level 4 x 6 minutes Gait outside around the small parking [employer] step over step 2 full flights  Feet on ball K2C, rotation, small bridge, PPT's PAssive stretch LE's Manual sheet traction Discussion about lifting grandchild to protect breast and back   08/28/23 Bike level 4 x 8 minutes Leg curls 25# Leg extension 5# Feet on ball K2C, rotation, bridges, PPT Green tband clamshells Ball b/n knees small bridge STM to the buttock and low back Manual sheet traction  08/25/23 Bike level 4 x 8 minutes Walk outside good pace, SOB and a limp on the left Feet on ball K2C, rotation, bridge, pelvic tilts needed a lot of verbal and tactile cues to get PPT Passive stretch LE's, calves are very tight calves, went over the stretch for home Manual sheet pelvic traction  08/21/23 Evaluation  PATIENT EDUCATION:  Education details: POC and HEP Person educated: Patient Education method: Explanation Education comprehension: verbalized understanding  HOME EXERCISE PROGRAM: Access Code: [facility] Access Code: [email or web address] URL: [email or web address] Date: 08/21/23 Prepared by: Ozell Mainland  Exercises - Hooklying Single Knee to Chest Stretch  - 2 x daily - 7 x weekly - 1 sets - 10 reps - 5 hold - Supine Double Knee to Chest  - 2 x daily - 7 x weekly - 1 sets - 10 reps - 5 hold - Supine Lower Trunk Rotation with PLB  - 2 x daily - 7 x  weekly - 1 sets - 10 reps - 5 hold - Supine Posterior Pelvic Tilt  - 2 x daily - 7 x weekly - 1 sets - 10 reps - 3 hold  ASSESSMENT:  CLINICAL IMPRESSION:   Patient is scheduled for left TKA on 10/15/23, she just underwent right breast surgery so we are limited to some extent, we are working on back pain now but again due to the knee issues she is walking with a limp at times which stresses the back, I am trying to avoid use of arms until she sees the breast surgeon in a few weeks.  WE did stairs step over step today and did well.  We also started talking and some demo if lifting child as she is going to have a grandchild in the next week  Patient has a right disc herniation at L2-3.   She had the right breast surgery June 26, has some limitations with lifting for this.  She is still having some significant low back pain.  Very tender in the right L2 area.  Fair strength.  Has left knee pain and has pain  up to 8/10 with walking has a left TKA scheduled for 11/10/23.  She is going to have a grandchild due August 12. OBJECTIVE IMPAIRMENTS: Abnormal gait, cardiopulmonary status limiting activity, decreased activity tolerance, decreased endurance, decreased mobility, difficulty walking, decreased ROM, decreased strength, increased muscle spasms, impaired flexibility, improper body mechanics, postural dysfunction, and pain.   REHAB POTENTIAL: Good  CLINICAL DECISION MAKING: Stable/uncomplicated  EVALUATION COMPLEXITY: Low   GOALS: Goals reviewed with patient? Yes  SHORT TERM GOALS: Target date: 09/21/23  Patient will be independent with initial HEP. Goal status: initial   LONG TERM GOALS: Target date: 01/21/24  Patient will be independent with advanced/ongoing HEP to improve outcomes and carryover.  Goal status: initial  2.  Patient will report at least 75% improvement in R knee pain to improve QOL. And back pain Baseline: 8/10 Goal status: initial  3.  Patient will increase lumbar ROM  25% Baseline:as noted above Initial  4.  Patient will demonstrate improved functional LE strength as demonstrated by 5/5. Baseline: see above Goal status: initial  5.  Patient will be able to ambulate 1000' without AD and normal gait pattern without increased pain to access community.  Baseline:  Goal status: ongoing 08/28/23  6.  Decrease lumbar pain 50%  Goal status: ongoing 05/01/23, 45% 05/13/23  7.  Report able to put shoes and socks on without pain > 4/10  Progressing 08/28/23  PLAN:  PT FREQUENCY: 2x/week  PT DURATION: 6 months  PLANNED INTERVENTIONS: Therapeutic exercises, Therapeutic activity, Neuromuscular re-education, Balance training, Gait training, Patient/Family education, Self Care, Joint mobilization, Stair training, Electrical stimulation, Cryotherapy, Moist heat, scar mobilization, Vasopneumatic device, and Manual therapy  PLAN FOR NEXT SESSION: bec careful with the UE's due to the recent breast surgery   Patient Details  Name: [name] MRN: [id number] Date of Birth: Aug 20, 1960 Referring Provider:  Colon Shove, MD  Encounter Date: 09/01/2023   OBADIAH OZELL ORN, PT 09/01/2023, 2:04 PM

## 2023-09-04 ENCOUNTER — Ambulatory Visit: Admitting: Physical Therapy

## 2023-09-04 ENCOUNTER — Encounter: Payer: Self-pay | Admitting: Physical Therapy

## 2023-09-04 ENCOUNTER — Other Ambulatory Visit: Payer: Self-pay | Admitting: Cardiology

## 2023-09-04 DIAGNOSIS — G8929 Other chronic pain: Secondary | ICD-10-CM

## 2023-09-04 DIAGNOSIS — R931 Abnormal findings on diagnostic imaging of heart and coronary circulation: Secondary | ICD-10-CM

## 2023-09-04 DIAGNOSIS — Z96651 Presence of right artificial knee joint: Secondary | ICD-10-CM

## 2023-09-04 DIAGNOSIS — M6281 Muscle weakness (generalized): Secondary | ICD-10-CM

## 2023-09-04 DIAGNOSIS — M25661 Stiffness of right knee, not elsewhere classified: Secondary | ICD-10-CM

## 2023-09-04 DIAGNOSIS — M549 Dorsalgia, unspecified: Secondary | ICD-10-CM | POA: Diagnosis not present

## 2023-09-04 DIAGNOSIS — R600 Localized edema: Secondary | ICD-10-CM

## 2023-09-04 DIAGNOSIS — M25561 Pain in right knee: Secondary | ICD-10-CM

## 2023-09-04 NOTE — Therapy (Signed)
 OUTPATIENT PHYSICAL THERAPY LOWER EXTREMITY TREATMENT    Patient Name: [name] MRN: [id number] DOB:[birth year], 63 y.o., female Today's Date: 09/04/2023  END OF SESSION:  PT End of Session - 09/04/23 1407     Visit Number 5    Date for PT Re-Evaluation 11/21/23    Authorization Type [employer]    PT Start Time 1403    PT Stop Time 1445    PT Time Calculation (min) 42 min    Activity Tolerance Patient tolerated treatment well    Behavior During Therapy WFL for tasks assessed/performed            Past Medical History:  Diagnosis Date   Arthritis    Back pain of lumbar region with sciatica    Cancer (HCC)    breast   Depression    Diabetes mellitus, type II (HCC)    Family history of anesthesia complication    grandfather died under anesthesia 70's- had heart issues   H/O carpal tunnel repair 2014   Headache(784.0)    Hyperlipidemia    Hypertension    Hypothyroidism    Medial meniscus tear    PONV (postoperative nausea and vomiting)    Sleep apnea    cpap since 10   Past Surgical History:  Procedure Laterality Date   BILATERAL TOTAL MASTECTOMY WITH AXILLARY LYMPH NODE DISSECTION     BREAST RECONSTRUCTION     CERVICAL DISC ARTHROPLASTY N/A 03/19/2013   Procedure: CERVICAL FIVE TO SIX, CERVICAL SIX TO SEVEN CERVICAL ANTERIOR DISC ARTHROPLASTY;  Surgeon: Victory Gens, MD;  Location: [facility];  Service: Neurosurgery;  Laterality: N/A;  C56 C67 artificial disc replacement   CESAREAN SECTION     COLONOSCOPY     DILATATION & CURETTAGE/HYSTEROSCOPY WITH TRUECLEAR N/A 11/25/2013   Procedure: DILATATION & CURETTAGE/HYSTEROSCOPY WITH TRUCLEAR;  Surgeon: Charlie CHRISTELLA Croak, MD;  Location: [facility];  Service: Gynecology;  Laterality: N/A;   ECTOPIC PREGNANCY SURGERY     GALLBLADDER SURGERY     MOUTH SURGERY     child- dog bite   SPINAL FUSION     2023   STERIOD INJECTION Left 09/09/2022   Procedure: LEFT KNEE STEROID INJECTION;  Surgeon: Melodi Lerner, MD;  Location:  [facility];  Service: Orthopedics;  Laterality: Left;  left knee injection 0928   TONSILLECTOMY     TOTAL KNEE ARTHROPLASTY Right 09/09/2022   Procedure: TOTAL KNEE ARTHROPLASTY;  Surgeon: Melodi Lerner, MD;  Location: [facility];  Service: Orthopedics;  Laterality: Right;   TUBAL LIGATION     Patient Active Problem List   Diagnosis Date Noted   OA (osteoarthritis) of knee 09/09/2022   Exertional dyspnea 04/17/2022   Elevated coronary artery calcium  score 04/17/2022   Spondylolisthesis at L4-L5 level 12/27/2021   Ductal carcinoma in situ (DCIS) of left breast 01/19/2018   Generalized anxiety disorder 11/07/2017   Depression 11/07/2017   Insomnia 11/07/2017   Cervical spondylosis 03/19/2013    PCP: Norleen Jungling  REFERRING PROVIDER: Lerner Lawn  REFERRING DIAG: R TKA 09/09/22  THERAPY DIAG:  Chronic bilateral back pain, unspecified back location  S/P total knee arthroplasty, right  Stiffness of right knee, not elsewhere classified  Muscle weakness (generalized)  Acute pain of right knee  Localized edema  Rationale for Evaluation and Treatment: Rehabilitation  ONSET DATE: 09/09/22  SUBJECTIVE:   SUBJECTIVE STATEMENT :  A lot of stress with insurance, not having a grandchild yet and surgeries coming up.  Patient had right breast revision on 07/17/23  due to scar encapsulation.  She is returning here due to the back pain.  Reports that she is doing a little better due to being less active with this surgery  Saw Dr. Colon last Friday, has a bulging disc on the right at L2, recommended continuation of PT.  Will have injection in back May 20th R TKA 8/19 L knee steroid injection 09/09/22  PAIN:  Are you having pain? Yes: NPRS scale: 6/10 Pain location: right low back, buttock and reports around to the front of the thigh Pain description: sharp really hurts, ache Aggravating factors: difficulty sleeping, difficulty walking and standing and sitting pain up to 9/10 trying to clean  the house recently Relieving factors: heat, Aleve , tramadol  and methacarbomol, the PT treatment was helping  PRECAUTIONS: None no baths, no pushing up, no weights with arms  RED FLAGS: None   WEIGHT BEARING RESTRICTIONS: No  FALLS:  Has patient fallen in last 6 months? No  LIVING ENVIRONMENT: Lives with: lives with their family and lives with their spouse Lives in: House/apartment Stairs: Yes: External: 3 steps; none Has following equipment at home: Single point cane and Walker - 2 wheeled  OCCUPATION: Not working  PLOF: Independent  PATIENT GOALS: less back pain  NEXT MD VISIT: none for Dr. Colon  OBJECTIVE:   PATIENT SURVEYS:    COGNITION: Overall cognitive status: Within functional limits for tasks assessed      EDEMA:    POSTURE: rounded shoulders and flexed trunk   PALPATION: Very tender in the right lumbar L2-5, some tenderness in the buttock  LOWER EXTREMITY ROM:  Active ROM Right eval   Right 09/26/22 Right 10/02/22  11/01/22 12/09/22  Hip flexion          Hip extension          Hip abduction          Hip adduction          Hip internal rotation          Hip external rotation          Knee flexion 110    82* AROM seated per pt request  78* self AAROM, 76* AROM  PROM 87 AROM 81 PROM 90 AROM 83 AROM 101 PROM 105  Knee extension 0    -16* supine heel prop with quad set  AROM 25 PROM 16 AROM 14 AROM 5 PROM 0  Ankle dorsiflexion          Ankle plantarflexion          Ankle inversion          Ankle eversion           (Blank rows = not tested)  LOWER EXTREMITY MMT:  MMT Right eval Left eval  Hip flexion 3+   Hip extension    Hip abduction    Hip adduction    Hip internal rotation    Hip external rotation    Knee flexion 4+   Knee extension 4+   Ankle dorsiflexion    Ankle plantarflexion    Ankle inversion    Ankle eversion     (Blank rows = not tested)  LUMBAR ROM:  decreased 25% flexion, decreased 75% for extension and side  bending, pain was worse with extension and right side bending. Hooklying is less painful that lying flat on back Mild tightness with pain during HS stretch, tight and painful piriformis bilaterally worse on the right , very weak core  FUNCTIONAL TESTS:  TUG 18 seconds no device  GAIT: No assistive device, slow mild antalgic on the right    TODAY'S TREATMENT:                                                                                                                              DATE:  09/04/23 Bike level 4 x 6 minutes LEg press 20# Stairs step over step Gait outside  Manual sheet traction STM to the right low back and hip Feet on ball k2c, rotation, bridge, PPT  09/01/23 Bike level 4 x 6 minutes Gait outside around the small parking [employer] step over step 2 full flights  Feet on ball K2C, rotation, small bridge, PPT's PAssive stretch LE's Manual sheet traction Discussion about lifting grandchild to protect breast and back   08/28/23 Bike level 4 x 8 minutes Leg curls 25# Leg extension 5# Feet on ball K2C, rotation, bridges, PPT Green tband clamshells Ball b/n knees small bridge STM to the buttock and low back Manual sheet traction  08/25/23 Bike level 4 x 8 minutes Walk outside good pace, SOB and a limp on the left Feet on ball K2C, rotation, bridge, pelvic tilts needed a lot of verbal and tactile cues to get PPT Passive stretch LE's, calves are very tight calves, went over the stretch for home Manual sheet pelvic traction  08/21/23 Evaluation  PATIENT EDUCATION:  Education details: POC and HEP Person educated: Patient Education method: Explanation Education comprehension: verbalized understanding  HOME EXERCISE PROGRAM: Access Code: [facility] Access Code: [email or web address] URL: [email or web address] Date: 08/21/23 Prepared by: Ozell Mainland  Exercises - Hooklying Single Knee to Chest Stretch  - 2 x daily - 7 x weekly - 1 sets - 10 reps - 5  hold - Supine Double Knee to Chest  - 2 x daily - 7 x weekly - 1 sets - 10 reps - 5 hold - Supine Lower Trunk Rotation with PLB  - 2 x daily - 7 x weekly - 1 sets - 10 reps - 5 hold - Supine Posterior Pelvic Tilt  - 2 x daily - 7 x weekly - 1 sets - 10 reps - 3 hold  ASSESSMENT:  CLINICAL IMPRESSION:   Patient is scheduled for left TKA on 10/15/23, she just underwent right breast surgery so we are limited to some extent, we are working on back pain now but again due to the knee issues she is walking with a limp at times which stresses the back, I am trying to avoid use of arms until she sees the breast surgeon in a few weeks.She has a lot of stress in her life and is having some increased LBP  Patient has a right disc herniation at L2-3.   She had the right breast surgery June 26, has some limitations with lifting for this.  She is still having some significant low back pain.  Very tender in the right L2 area.  Fair strength.  Has  left knee pain and has pain up to 8/10 with walking has a left TKA scheduled for 11/10/23.  She is going to have a grandchild due August 12. OBJECTIVE IMPAIRMENTS: Abnormal gait, cardiopulmonary status limiting activity, decreased activity tolerance, decreased endurance, decreased mobility, difficulty walking, decreased ROM, decreased strength, increased muscle spasms, impaired flexibility, improper body mechanics, postural dysfunction, and pain.   REHAB POTENTIAL: Good  CLINICAL DECISION MAKING: Stable/uncomplicated  EVALUATION COMPLEXITY: Low   GOALS: Goals reviewed with patient? Yes  SHORT TERM GOALS: Target date: 09/21/23  Patient will be independent with initial HEP. Goal status: progressing 09/04/23   LONG TERM GOALS: Target date: 01/21/24  Patient will be independent with advanced/ongoing HEP to improve outcomes and carryover.  Goal status: initial  2.  Patient will report at least 75% improvement in R knee pain to improve QOL. And back pain Baseline:  8/10 Goal status: initial  3.  Patient will increase lumbar ROM 25% Baseline:as noted above Initial  4.  Patient will demonstrate improved functional LE strength as demonstrated by 5/5. Baseline: see above Goal status: initial  5.  Patient will be able to ambulate 1000' without AD and normal gait pattern without increased pain to access community.  Baseline:  Goal status: ongoing 08/28/23  6.  Decrease lumbar pain 50%  Goal status: ongoing 05/01/23, 45% 05/13/23  7.  Report able to put shoes and socks on without pain > 4/10  Progressing 08/28/23  PLAN:  PT FREQUENCY: 2x/week  PT DURATION: 6 months  PLANNED INTERVENTIONS: Therapeutic exercises, Therapeutic activity, Neuromuscular re-education, Balance training, Gait training, Patient/Family education, Self Care, Joint mobilization, Stair training, Electrical stimulation, Cryotherapy, Moist heat, scar mobilization, Vasopneumatic device, and Manual therapy  PLAN FOR NEXT SESSION:  careful with the UE's due to the recent breast surgery  working on core strength and endurance   Patient Details  Name: [name] MRN: [id number] Date of Birth: 17-Jan-1961 Referring Provider:  Colon Shove, MD  Encounter Date: 09/04/2023   OBADIAH OZELL ORN, PT 09/04/2023, 2:08 PM

## 2023-09-05 ENCOUNTER — Ambulatory Visit: Admitting: Professional Counselor

## 2023-09-10 ENCOUNTER — Ambulatory Visit

## 2023-09-12 ENCOUNTER — Ambulatory Visit: Admitting: Professional Counselor

## 2023-09-12 ENCOUNTER — Ambulatory Visit

## 2023-09-16 ENCOUNTER — Ambulatory Visit: Admitting: Physical Therapy

## 2023-09-16 ENCOUNTER — Encounter: Payer: Self-pay | Admitting: Physical Therapy

## 2023-09-16 DIAGNOSIS — M25561 Pain in right knee: Secondary | ICD-10-CM

## 2023-09-16 DIAGNOSIS — M6281 Muscle weakness (generalized): Secondary | ICD-10-CM

## 2023-09-16 DIAGNOSIS — Z96651 Presence of right artificial knee joint: Secondary | ICD-10-CM

## 2023-09-16 DIAGNOSIS — R600 Localized edema: Secondary | ICD-10-CM

## 2023-09-16 DIAGNOSIS — R2689 Other abnormalities of gait and mobility: Secondary | ICD-10-CM

## 2023-09-16 DIAGNOSIS — G8929 Other chronic pain: Secondary | ICD-10-CM

## 2023-09-16 DIAGNOSIS — M549 Dorsalgia, unspecified: Secondary | ICD-10-CM | POA: Diagnosis not present

## 2023-09-16 DIAGNOSIS — M25661 Stiffness of right knee, not elsewhere classified: Secondary | ICD-10-CM

## 2023-09-16 NOTE — Therapy (Signed)
 OUTPATIENT PHYSICAL THERAPY LOWER EXTREMITY TREATMENT    Patient Name: [name] MRN: [id number] DOB:[birth year], 63 y.o., female Today's Date: 09/16/2023  END OF SESSION:  PT End of Session - 09/16/23 1402     Visit Number 6    Date for PT Re-Evaluation 11/21/23    Authorization Type [employer]    PT Start Time 1402    PT Stop Time 1446    PT Time Calculation (min) 44 min    Activity Tolerance Patient tolerated treatment well    Behavior During Therapy WFL for tasks assessed/performed            Past Medical History:  Diagnosis Date   Arthritis    Back pain of lumbar region with sciatica    Cancer (HCC)    breast   Depression    Diabetes mellitus, type II (HCC)    Family history of anesthesia complication    grandfather died under anesthesia 70's- had heart issues   H/O carpal tunnel repair 2014   Headache(784.0)    Hyperlipidemia    Hypertension    Hypothyroidism    Medial meniscus tear    PONV (postoperative nausea and vomiting)    Sleep apnea    cpap since 10   Past Surgical History:  Procedure Laterality Date   BILATERAL TOTAL MASTECTOMY WITH AXILLARY LYMPH NODE DISSECTION     BREAST RECONSTRUCTION     CERVICAL DISC ARTHROPLASTY N/A 03/19/2013   Procedure: CERVICAL FIVE TO SIX, CERVICAL SIX TO SEVEN CERVICAL ANTERIOR DISC ARTHROPLASTY;  Surgeon: Victory Gens, MD;  Location: [facility];  Service: Neurosurgery;  Laterality: N/A;  C56 C67 artificial disc replacement   CESAREAN SECTION     COLONOSCOPY     DILATATION & CURETTAGE/HYSTEROSCOPY WITH TRUECLEAR N/A 11/25/2013   Procedure: DILATATION & CURETTAGE/HYSTEROSCOPY WITH TRUCLEAR;  Surgeon: Charlie CHRISTELLA Croak, MD;  Location: [facility];  Service: Gynecology;  Laterality: N/A;   ECTOPIC PREGNANCY SURGERY     GALLBLADDER SURGERY     MOUTH SURGERY     child- dog bite   SPINAL FUSION     2023   STERIOD INJECTION Left 09/09/2022   Procedure: LEFT KNEE STEROID INJECTION;  Surgeon: Melodi Lerner, MD;  Location:  [facility];  Service: Orthopedics;  Laterality: Left;  left knee injection 0928   TONSILLECTOMY     TOTAL KNEE ARTHROPLASTY Right 09/09/2022   Procedure: TOTAL KNEE ARTHROPLASTY;  Surgeon: Melodi Lerner, MD;  Location: [facility];  Service: Orthopedics;  Laterality: Right;   TUBAL LIGATION     Patient Active Problem List   Diagnosis Date Noted   OA (osteoarthritis) of knee 09/09/2022   Exertional dyspnea 04/17/2022   Elevated coronary artery calcium  score 04/17/2022   Spondylolisthesis at L4-L5 level 12/27/2021   Ductal carcinoma in situ (DCIS) of left breast 01/19/2018   Generalized anxiety disorder 11/07/2017   Depression 11/07/2017   Insomnia 11/07/2017   Cervical spondylosis 03/19/2013    PCP: Norleen Jungling  REFERRING PROVIDER: Lerner Lawn  REFERRING DIAG: R TKA 09/09/22  THERAPY DIAG:  Chronic bilateral back pain, unspecified back location  S/P total knee arthroplasty, right  Stiffness of right knee, not elsewhere classified  Muscle weakness (generalized)  Acute pain of right knee  Localized edema  Other abnormalities of gait and mobility  Rationale for Evaluation and Treatment: Rehabilitation  ONSET DATE: 09/09/22  SUBJECTIVE:   SUBJECTIVE STATEMENT :  Patient had a new grandbaby, she has been lifting it, but also had to travel, back  is hurting much more.  She tells me that we are clear for moving arms.  Patient had right breast revision on 07/17/23 due to scar encapsulation.  She is returning here due to the back pain.  Reports that she is doing a little better due to being less active with this surgery  Saw Dr. Colon last Friday, has a bulging disc on the right at L2, recommended continuation of PT.  Will have injection in back May 20th R TKA 8/19 L knee steroid injection 09/09/22  PAIN:  Are you having pain? Yes: NPRS scale: 6/10 Pain location: right low back, buttock and reports around to the front of the thigh Pain description: sharp really hurts,  ache Aggravating factors: difficulty sleeping, difficulty walking and standing and sitting pain up to 9/10 trying to clean the house recently Relieving factors: heat, Aleve , tramadol  and methacarbomol, the PT treatment was helping  PRECAUTIONS: None no baths, no pushing up, no weights with arms  RED FLAGS: None   WEIGHT BEARING RESTRICTIONS: No  FALLS:  Has patient fallen in last 6 months? No  LIVING ENVIRONMENT: Lives with: lives with their family and lives with their spouse Lives in: House/apartment Stairs: Yes: External: 3 steps; none Has following equipment at home: Single point cane and Walker - 2 wheeled  OCCUPATION: Not working  PLOF: Independent  PATIENT GOALS: less back pain  NEXT MD VISIT: none for Dr. Colon  OBJECTIVE:   PATIENT SURVEYS:    COGNITION: Overall cognitive status: Within functional limits for tasks assessed      EDEMA:    POSTURE: rounded shoulders and flexed trunk   PALPATION: Very tender in the right lumbar L2-5, some tenderness in the buttock  LOWER EXTREMITY ROM:  Active ROM Right eval   Right 09/26/22 Right 10/02/22  11/01/22 12/09/22  Hip flexion          Hip extension          Hip abduction          Hip adduction          Hip internal rotation          Hip external rotation          Knee flexion 110    82* AROM seated per pt request  78* self AAROM, 76* AROM  PROM 87 AROM 81 PROM 90 AROM 83 AROM 101 PROM 105  Knee extension 0    -16* supine heel prop with quad set  AROM 25 PROM 16 AROM 14 AROM 5 PROM 0  Ankle dorsiflexion          Ankle plantarflexion          Ankle inversion          Ankle eversion           (Blank rows = not tested)  LOWER EXTREMITY MMT:  MMT Right eval Left eval  Hip flexion 3+   Hip extension    Hip abduction    Hip adduction    Hip internal rotation    Hip external rotation    Knee flexion 4+   Knee extension 4+   Ankle dorsiflexion    Ankle plantarflexion    Ankle inversion     Ankle eversion     (Blank rows = not tested)  LUMBAR ROM:  decreased 25% flexion, decreased 75% for extension and side bending, pain was worse with extension and right side bending. Hooklying is less painful that lying flat on back  Mild tightness with pain during HS stretch, tight and painful piriformis bilaterally worse on the right , very weak core  FUNCTIONAL TESTS:  TUG 18 seconds no device  GAIT: No assistive device, slow mild antalgic on the right    TODAY'S TREATMENT:                                                                                                                              DATE:  09/16/23 Gait around the building good pace Bike level 5 x 6 minutes Red tband Row Red tband extension Feet on ball K2C, rotation, bridge, gentle isometric abs Manual sheet traction STM to the low back and buttocks  09/04/23 Bike level 4 x 6 minutes LEg press 20# Stairs step over step Gait outside  Manual sheet traction STM to the right low back and hip Feet on ball k2c, rotation, bridge, PPT  09/01/23 Bike level 4 x 6 minutes Gait outside around the small parking [employer] step over step 2 full flights  Feet on ball K2C, rotation, small bridge, PPT's PAssive stretch LE's Manual sheet traction Discussion about lifting grandchild to protect breast and back   08/28/23 Bike level 4 x 8 minutes Leg curls 25# Leg extension 5# Feet on ball K2C, rotation, bridges, PPT Green tband clamshells Ball b/n knees small bridge STM to the buttock and low back Manual sheet traction  08/25/23 Bike level 4 x 8 minutes Walk outside good pace, SOB and a limp on the left Feet on ball K2C, rotation, bridge, pelvic tilts needed a lot of verbal and tactile cues to get PPT Passive stretch LE's, calves are very tight calves, went over the stretch for home Manual sheet pelvic traction  08/21/23 Evaluation  PATIENT EDUCATION:  Education details: POC and HEP Person educated:  Patient Education method: Explanation Education comprehension: verbalized understanding  HOME EXERCISE PROGRAM: Access Code: [facility] Access Code: [email or web address] URL: [email or web address] Date: 08/21/23 Prepared by: Ozell Mainland  Exercises - Hooklying Single Knee to Chest Stretch  - 2 x daily - 7 x weekly - 1 sets - 10 reps - 5 hold - Supine Double Knee to Chest  - 2 x daily - 7 x weekly - 1 sets - 10 reps - 5 hold - Supine Lower Trunk Rotation with PLB  - 2 x daily - 7 x weekly - 1 sets - 10 reps - 5 hold - Supine Posterior Pelvic Tilt  - 2 x daily - 7 x weekly - 1 sets - 10 reps - 3 hold  ASSESSMENT:  CLINICAL IMPRESSION:   Patient is scheduled for left TKA on 10/15/23, she just underwent right breast surgery the MD (breast surgeon) okayed some exercises for her arms, we are working on back pain now but again due to the knee issues she is walking with a limp at times which stresses the back, I introduced some UE exercises and she did well without discomfort. She is a little  stiff in the knee and the back, feels it was the ride and poor beds Patient has a right disc herniation at L2-3.   She had the right breast surgery June 26, has some limitations with lifting for this.  She is still having some significant low back pain.  Very tender in the right L2 area.  Fair strength.  Has left knee pain and has pain up to 8/10 with walking has a left TKA scheduled for 11/10/23.  She is going to have a grandchild due August 12. OBJECTIVE IMPAIRMENTS: Abnormal gait, cardiopulmonary status limiting activity, decreased activity tolerance, decreased endurance, decreased mobility, difficulty walking, decreased ROM, decreased strength, increased muscle spasms, impaired flexibility, improper body mechanics, postural dysfunction, and pain.   REHAB POTENTIAL: Good  CLINICAL DECISION MAKING: Stable/uncomplicated  EVALUATION COMPLEXITY: Low   GOALS: Goals reviewed with patient? Yes  SHORT  TERM GOALS: Target date: 09/21/23  Patient will be independent with initial HEP. Goal status: progressing 09/04/23   LONG TERM GOALS: Target date: 01/21/24  Patient will be independent with advanced/ongoing HEP to improve outcomes and carryover.  Goal status: initial  2.  Patient will report at least 75% improvement in R knee pain to improve QOL. And back pain Baseline: 8/10 Goal status: initial  3.  Patient will increase lumbar ROM 25% Baseline:as noted above Initial  4.  Patient will demonstrate improved functional LE strength as demonstrated by 5/5. Baseline: see above Goal status: ongoing 09/16/23  5.  Patient will be able to ambulate 1000' without AD and normal gait pattern without increased pain to access community.  Baseline:  Goal status:met 09/16/23  6.  Decrease lumbar pain 50%  Goal status: ongoing 05/01/23, 45% 05/13/23  7.  Report able to put shoes and socks on without pain > 4/10  Progressing 08/28/23  PLAN:  PT FREQUENCY: 2x/week  PT DURATION: 6 months  PLANNED INTERVENTIONS: Therapeutic exercises, Therapeutic activity, Neuromuscular re-education, Balance training, Gait training, Patient/Family education, Self Care, Joint mobilization, Stair training, Electrical stimulation, Cryotherapy, Moist heat, scar mobilization, Vasopneumatic device, and Manual therapy  PLAN FOR NEXT SESSION:  able to do UE activities so initiated see how she is doing   Patient Details  Name: ILLIANNA PASCHAL MRN: [id number] Date of Birth: 1960-10-06 Referring Provider:  Colon Shove, MD  Encounter Date: 09/16/2023   OBADIAH OZELL ORN, PT 09/16/2023, 2:02 PM

## 2023-09-19 ENCOUNTER — Ambulatory Visit: Admitting: Professional Counselor

## 2023-09-19 ENCOUNTER — Encounter: Payer: Self-pay | Admitting: Professional Counselor

## 2023-09-19 DIAGNOSIS — F331 Major depressive disorder, recurrent, moderate: Secondary | ICD-10-CM | POA: Diagnosis not present

## 2023-09-19 DIAGNOSIS — F411 Generalized anxiety disorder: Secondary | ICD-10-CM

## 2023-09-19 NOTE — Progress Notes (Addendum)
      [facility] Counselor/Therapist Progress Note  Patient ID: [name], MRN: [id number],    Date: 09/19/2023  Time Spent: 2:13 PM to 3:11 PM  Treatment Type: Family with patient  Patient presented to session with spouse.  Reported Symptoms: Sense of hopelessness, migraines, worries, stress, sadness, nervousness, anxiousness, trouble relaxing, low mood, sadness, fatigue, health concerns, interpersonal concerns, sense of overwhelm, phase of life concerns, emotional dysregulation  Mental Status Exam:  Appearance:   Neat     Behavior:  Appropriate and Sharing  Motor:  Normal  Speech/Language:   Clear and Coherent and Normal Rate  Affect:  Tearful  Mood:  anxious, depressed, irritable, and sad  Thought process:  normal  Thought content:    WNL  Sensory/Perceptual disturbances:    WNL  Orientation:  oriented to person, place, time/date, and situation  Attention:  Good  Concentration:  Good  Memory:  WNL  Fund of knowledge:   Good  Insight:    Good  Judgment:   Good  Impulse Control:  Good   Risk Assessment: Danger to Self:  No Self-injurious Behavior: No Danger to Others: No Duty to Warn:no Physical Aggression / Violence:No  Access to Firearms a concern: No  Gang Involvement:No   Subjective: Patient presented to session to address concerns of anxiety and depression.  She presented to session with her spouse.  She reported exacerbated symptomology since last session, and experience of overspending as a way to cope.  She also reporting having emotionally dysregulated as a result of increasing stress in her marital relationship.  Patient and patient spouse continue to discuss financial concerns as relates transparency, and ongoing challenges related to nonnegotiable's established in sessions prior so asked to encourage marital connection and harmony.  Patient and patient's spouse also processed experience of family member with whom they are working to limit over  functioning. Counselor actively listened, affirmed feelings and experience of both, and helped to facilitate insight and to reinforce coping strategies including utilizing emotional regulation skills independently, recognizing leading to conflict and interrupting by self-regulating, taking space, distracting, seeking external support, continue to resource devotionals together, community fellowship and implement boundaries in family system where needed.  Counselor provided recommendation for joint spiritual growth work.  Interventions: Assertiveness/Communication, Solution-Oriented/Positive Psychology, Humanistic/Existential, Insight-Oriented, Interpersonal, and Resourcing  Diagnosis:   ICD-10-CM   1. Generalized anxiety disorder  F41.1     2. Major depressive disorder, recurrent episode, moderate (HCC)  F33.1       Plan: Patient is scheduled for follow-up; continue process work and developing coping skills.  STG between sessions to implement boundaries in family system so as to limit enabling; continue to practice mindfulness around nonnegotiables in marital relationship, and reestablish devotionals; continue to work on financial concerns with the help of financial advisor; resource emotional regulation skills.  Consider taking classes together at [facility] for intra and interpersonal spiritual growth work.  Almarie ONEIDA Sprang, [facility]

## 2023-09-23 ENCOUNTER — Ambulatory Visit: Attending: Neurological Surgery | Admitting: Physical Therapy

## 2023-09-23 DIAGNOSIS — G8929 Other chronic pain: Secondary | ICD-10-CM | POA: Insufficient documentation

## 2023-09-23 DIAGNOSIS — M549 Dorsalgia, unspecified: Secondary | ICD-10-CM | POA: Insufficient documentation

## 2023-09-23 DIAGNOSIS — R2689 Other abnormalities of gait and mobility: Secondary | ICD-10-CM | POA: Insufficient documentation

## 2023-09-23 DIAGNOSIS — R600 Localized edema: Secondary | ICD-10-CM | POA: Insufficient documentation

## 2023-09-23 DIAGNOSIS — Z96651 Presence of right artificial knee joint: Secondary | ICD-10-CM | POA: Insufficient documentation

## 2023-09-23 DIAGNOSIS — M6281 Muscle weakness (generalized): Secondary | ICD-10-CM | POA: Insufficient documentation

## 2023-09-23 DIAGNOSIS — M25561 Pain in right knee: Secondary | ICD-10-CM | POA: Insufficient documentation

## 2023-09-23 DIAGNOSIS — M25661 Stiffness of right knee, not elsewhere classified: Secondary | ICD-10-CM | POA: Insufficient documentation

## 2023-09-25 ENCOUNTER — Ambulatory Visit: Admitting: Physical Therapy

## 2023-09-25 ENCOUNTER — Encounter: Payer: Self-pay | Admitting: Physical Therapy

## 2023-09-25 DIAGNOSIS — M25661 Stiffness of right knee, not elsewhere classified: Secondary | ICD-10-CM

## 2023-09-25 DIAGNOSIS — M6281 Muscle weakness (generalized): Secondary | ICD-10-CM | POA: Diagnosis present

## 2023-09-25 DIAGNOSIS — R600 Localized edema: Secondary | ICD-10-CM | POA: Diagnosis present

## 2023-09-25 DIAGNOSIS — R2689 Other abnormalities of gait and mobility: Secondary | ICD-10-CM | POA: Diagnosis present

## 2023-09-25 DIAGNOSIS — Z96651 Presence of right artificial knee joint: Secondary | ICD-10-CM

## 2023-09-25 DIAGNOSIS — M25561 Pain in right knee: Secondary | ICD-10-CM | POA: Diagnosis present

## 2023-09-25 DIAGNOSIS — G8929 Other chronic pain: Secondary | ICD-10-CM | POA: Diagnosis present

## 2023-09-25 DIAGNOSIS — M549 Dorsalgia, unspecified: Secondary | ICD-10-CM | POA: Diagnosis present

## 2023-09-25 NOTE — Therapy (Signed)
 OUTPATIENT PHYSICAL THERAPY LOWER EXTREMITY TREATMENT    Patient Name: [name] MRN: [id number] DOB:1960-12-01, 63 y.o., female Today's Date: 09/25/2023  END OF SESSION:  PT End of Session - 09/25/23 1749     Visit Number 7    Date for PT Re-Evaluation 11/21/23    Authorization Type [employer]    PT Start Time 1745    PT Stop Time 1830    PT Time Calculation (min) 45 min    Activity Tolerance Patient tolerated treatment well    Behavior During Therapy WFL for tasks assessed/performed            Past Medical History:  Diagnosis Date   Arthritis    Back pain of lumbar region with sciatica    Cancer (HCC)    breast   Depression    Diabetes mellitus, type II (HCC)    Family history of anesthesia complication    grandfather died under anesthesia 70's- had heart issues   H/O carpal tunnel repair 2014   Headache(784.0)    Hyperlipidemia    Hypertension    Hypothyroidism    Medial meniscus tear    PONV (postoperative nausea and vomiting)    Sleep apnea    cpap since 10   Past Surgical History:  Procedure Laterality Date   BILATERAL TOTAL MASTECTOMY WITH AXILLARY LYMPH NODE DISSECTION     BREAST RECONSTRUCTION     CERVICAL DISC ARTHROPLASTY N/A 03/19/2013   Procedure: CERVICAL FIVE TO SIX, CERVICAL SIX TO SEVEN CERVICAL ANTERIOR DISC ARTHROPLASTY;  Surgeon: Victory Gens, MD;  Location: [facility];  Service: Neurosurgery;  Laterality: N/A;  C56 C67 artificial disc replacement   CESAREAN SECTION     COLONOSCOPY     DILATATION & CURETTAGE/HYSTEROSCOPY WITH TRUECLEAR N/A 11/25/2013   Procedure: DILATATION & CURETTAGE/HYSTEROSCOPY WITH TRUCLEAR;  Surgeon: Charlie CHRISTELLA Croak, MD;  Location: [facility];  Service: Gynecology;  Laterality: N/A;   ECTOPIC PREGNANCY SURGERY     GALLBLADDER SURGERY     MOUTH SURGERY     child- dog bite   SPINAL FUSION     2023   STERIOD INJECTION Left 09/09/2022   Procedure: LEFT KNEE STEROID INJECTION;  Surgeon: Melodi Lerner, MD;  Location: [facility]  ORS;  Service: Orthopedics;  Laterality: Left;  left knee injection 0928   TONSILLECTOMY     TOTAL KNEE ARTHROPLASTY Right 09/09/2022   Procedure: TOTAL KNEE ARTHROPLASTY;  Surgeon: Melodi Lerner, MD;  Location: [facility];  Service: Orthopedics;  Laterality: Right;   TUBAL LIGATION     Patient Active Problem List   Diagnosis Date Noted   OA (osteoarthritis) of knee 09/09/2022   Exertional dyspnea 04/17/2022   Elevated coronary artery calcium  score 04/17/2022   Spondylolisthesis at L4-L5 level 12/27/2021   Ductal carcinoma in situ (DCIS) of left breast 01/19/2018   Generalized anxiety disorder 11/07/2017   Depression 11/07/2017   Insomnia 11/07/2017   Cervical spondylosis 03/19/2013    PCP: Norleen Jungling  REFERRING PROVIDER: Lerner Lawn  REFERRING DIAG: R TKA 09/09/22  THERAPY DIAG:  Chronic bilateral back pain, unspecified back location  S/P total knee arthroplasty, right  Stiffness of right knee, not elsewhere classified  Muscle weakness (generalized)  Rationale for Evaluation and Treatment: Rehabilitation  ONSET DATE: 09/09/22  SUBJECTIVE:   SUBJECTIVE STATEMENT :  Doing okay, a lot of stress and appointments, running around.  Patient had right breast revision on 07/17/23 due to scar encapsulation.  She is returning here due to the back pain.  Reports that she is doing a little better due to being less active with this surgery  Saw Dr. Colon last Friday, has a bulging disc on the right at L2, recommended continuation of PT.  Will have injection in back May 20th R TKA 8/19 L knee steroid injection 09/09/22  PAIN:  Are you having pain? Yes: NPRS scale: 6/10 Pain location: right low back, buttock and reports around to the front of the thigh Pain description: sharp really hurts, ache Aggravating factors: difficulty sleeping, difficulty walking and standing and sitting pain up to 9/10 trying to clean the house recently Relieving factors: heat, Aleve , tramadol  and  methacarbomol, the PT treatment was helping  PRECAUTIONS: None no baths, no pushing up, no weights with arms  RED FLAGS: None   WEIGHT BEARING RESTRICTIONS: No  FALLS:  Has patient fallen in last 6 months? No  LIVING ENVIRONMENT: Lives with: lives with their family and lives with their spouse Lives in: House/apartment Stairs: Yes: External: 3 steps; none Has following equipment at home: Single point cane and Walker - 2 wheeled  OCCUPATION: Not working  PLOF: Independent  PATIENT GOALS: less back pain  NEXT MD VISIT: none for Dr. Colon  OBJECTIVE:   PATIENT SURVEYS:    COGNITION: Overall cognitive status: Within functional limits for tasks assessed      EDEMA:    POSTURE: rounded shoulders and flexed trunk   PALPATION: Very tender in the right lumbar L2-5, some tenderness in the buttock  LOWER EXTREMITY ROM:  Active ROM Right eval   Right 09/26/22 Right 10/02/22  11/01/22 12/09/22  Hip flexion          Hip extension          Hip abduction          Hip adduction          Hip internal rotation          Hip external rotation          Knee flexion 110    82* AROM seated per pt request  78* self AAROM, 76* AROM  PROM 87 AROM 81 PROM 90 AROM 83 AROM 101 PROM 105  Knee extension 0    -16* supine heel prop with quad set  AROM 25 PROM 16 AROM 14 AROM 5 PROM 0  Ankle dorsiflexion          Ankle plantarflexion          Ankle inversion          Ankle eversion           (Blank rows = not tested)  LOWER EXTREMITY MMT:  MMT Right eval Left eval  Hip flexion 3+   Hip extension    Hip abduction    Hip adduction    Hip internal rotation    Hip external rotation    Knee flexion 4+   Knee extension 4+   Ankle dorsiflexion    Ankle plantarflexion    Ankle inversion    Ankle eversion     (Blank rows = not tested)  LUMBAR ROM:  decreased 25% flexion, decreased 75% for extension and side bending, pain was worse with extension and right side  bending. Hooklying is less painful that lying flat on back Mild tightness with pain during HS stretch, tight and painful piriformis bilaterally worse on the right , very weak core  FUNCTIONAL TESTS:  TUG 18 seconds no device  GAIT: No assistive device, slow mild antalgic on  the right    TODAY'S TREATMENT:                                                                                                                              DATE:  09/25/23 Bike level 5 x 6 minutes Gait outside brisk pace 5# straight arm pulls 20# leg press 10# lats Green tband clamshells Ball b/n knees squeeze STM with the Tgun Manual sheet traction   09/16/23 Gait around the building good pace Bike level 5 x 6 minutes Red tband Row Red tband extension Feet on ball K2C, rotation, bridge, gentle isometric abs Manual sheet traction STM to the low back and buttocks  09/04/23 Bike level 4 x 6 minutes LEg press 20# Stairs step over step Gait outside  Manual sheet traction STM to the right low back and hip Feet on ball k2c, rotation, bridge, PPT  09/01/23 Bike level 4 x 6 minutes Gait outside around the small parking [employer] step over step 2 full flights  Feet on ball K2C, rotation, small bridge, PPT's PAssive stretch LE's Manual sheet traction Discussion about lifting grandchild to protect breast and back   08/28/23 Bike level 4 x 8 minutes Leg curls 25# Leg extension 5# Feet on ball K2C, rotation, bridges, PPT Green tband clamshells Ball b/n knees small bridge STM to the buttock and low back Manual sheet traction  08/25/23 Bike level 4 x 8 minutes Walk outside good pace, SOB and a limp on the left Feet on ball K2C, rotation, bridge, pelvic tilts needed a lot of verbal and tactile cues to get PPT Passive stretch LE's, calves are very tight calves, went over the stretch for home Manual sheet pelvic traction  08/21/23 Evaluation  PATIENT EDUCATION:  Education details: POC and  HEP Person educated: Patient Education method: Explanation Education comprehension: verbalized understanding  HOME EXERCISE PROGRAM: Access Code: [facility] Access Code: [email or web address] URL: [email or web address] Date: 08/21/23 Prepared by: [name]  Exercises - Hooklying Single Knee to Chest Stretch  - 2 x daily - 7 x weekly - 1 sets - 10 reps - 5 hold - Supine Double Knee to Chest  - 2 x daily - 7 x weekly - 1 sets - 10 reps - 5 hold - Supine Lower Trunk Rotation with PLB  - 2 x daily - 7 x weekly - 1 sets - 10 reps - 5 hold - Supine Posterior Pelvic Tilt  - 2 x daily - 7 x weekly - 1 sets - 10 reps - 3 hold  ASSESSMENT:  CLINICAL IMPRESSION:   Patient is scheduled for left TKA on 10/15/23, started some UE exercises as the breast surgeon okayed this, no issues with this today.  we are working on back pain now but again due to the knee issues she is walking with a limp at times which stresses the back, her son is here and doing some treatment for her, he is a [employer]  Patient has  a right disc herniation at L2-3.   She had the right breast surgery June 26, has some limitations with lifting for this.  She is still having some significant low back pain.  Very tender in the right L2 area.  Fair strength.  Has left knee pain and has pain up to 8/10 with walking has a left TKA scheduled for 11/10/23.  She is going to have a grandchild due August 12. OBJECTIVE IMPAIRMENTS: Abnormal gait, cardiopulmonary status limiting activity, decreased activity tolerance, decreased endurance, decreased mobility, difficulty walking, decreased ROM, decreased strength, increased muscle spasms, impaired flexibility, improper body mechanics, postural dysfunction, and pain.   REHAB POTENTIAL: Good  CLINICAL DECISION MAKING: Stable/uncomplicated  EVALUATION COMPLEXITY: Low   GOALS: Goals reviewed with patient? Yes  SHORT TERM GOALS: Target date: 09/21/23  Patient will be independent with  initial HEP. Goal status: progressing 09/04/23   LONG TERM GOALS: Target date: 01/21/24  Patient will be independent with advanced/ongoing HEP to improve outcomes and carryover.  Goal status: initial  2.  Patient will report at least 75% improvement in R knee pain to improve QOL. And back pain Baseline: 8/10 Goal status:progressing 09/25/23  3.  Patient will increase lumbar ROM 25% Baseline:as noted above Initial  4.  Patient will demonstrate improved functional LE strength as demonstrated by 5/5. Baseline: see above Goal status: ongoing 09/16/23  5.  Patient will be able to ambulate 1000' without AD and normal gait pattern without increased pain to access community.  Baseline:  Goal status:met 09/16/23  6.  Decrease lumbar pain 50%  Goal status: ongoing 05/01/23, 45% 05/13/23  7.  Report able to put shoes and socks on without pain > 4/10  Progressing 08/28/23  PLAN:  PT FREQUENCY: 2x/week  PT DURATION: 6 months  PLANNED INTERVENTIONS: Therapeutic exercises, Therapeutic activity, Neuromuscular re-education, Balance training, Gait training, Patient/Family education, Self Care, Joint mobilization, Stair training, Electrical stimulation, Cryotherapy, Moist heat, scar mobilization, Vasopneumatic device, and Manual therapy  PLAN FOR NEXT SESSION:  able to do UE activities so initiated see how she is doing   Patient Details  Name: [name] MRN: [id number] Date of Birth: February 22, 1960 Referring Provider:  Colon Shove, MD  Encounter Date: 09/25/2023   OBADIAH Alicia ORN, PT 09/25/2023, 5:49 PM

## 2023-09-29 ENCOUNTER — Ambulatory Visit: Admitting: Physical Therapy

## 2023-09-29 ENCOUNTER — Encounter: Payer: Self-pay | Admitting: Physical Therapy

## 2023-09-29 DIAGNOSIS — G8929 Other chronic pain: Secondary | ICD-10-CM

## 2023-09-29 DIAGNOSIS — M549 Dorsalgia, unspecified: Secondary | ICD-10-CM | POA: Diagnosis not present

## 2023-09-29 DIAGNOSIS — Z96651 Presence of right artificial knee joint: Secondary | ICD-10-CM

## 2023-09-29 DIAGNOSIS — M25661 Stiffness of right knee, not elsewhere classified: Secondary | ICD-10-CM

## 2023-09-29 DIAGNOSIS — M25561 Pain in right knee: Secondary | ICD-10-CM

## 2023-09-29 DIAGNOSIS — M6281 Muscle weakness (generalized): Secondary | ICD-10-CM

## 2023-09-29 NOTE — Therapy (Signed)
 OUTPATIENT PHYSICAL THERAPY LOWER EXTREMITY TREATMENT    Patient Name: [name] MRN: [id number] DOB:[birth year], 63 y.o., female Today's Date: 09/29/2023  END OF SESSION:  PT End of Session - 09/29/23 1547     Visit Number 8    Date for PT Re-Evaluation 11/21/23    Authorization Type [employer]    PT Start Time 1530    PT Stop Time 1630    PT Time Calculation (min) 60 min    Activity Tolerance Patient tolerated treatment well    Behavior During Therapy WFL for tasks assessed/performed            Past Medical History:  Diagnosis Date   Arthritis    Back pain of lumbar region with sciatica    Cancer (HCC)    breast   Depression    Diabetes mellitus, type II (HCC)    Family history of anesthesia complication    grandfather died under anesthesia 70's- had heart issues   H/O carpal tunnel repair 2014   Headache(784.0)    Hyperlipidemia    Hypertension    Hypothyroidism    Medial meniscus tear    PONV (postoperative nausea and vomiting)    Sleep apnea    cpap since 10   Past Surgical History:  Procedure Laterality Date   BILATERAL TOTAL MASTECTOMY WITH AXILLARY LYMPH NODE DISSECTION     BREAST RECONSTRUCTION     CERVICAL DISC ARTHROPLASTY N/A 03/19/2013   Procedure: CERVICAL FIVE TO SIX, CERVICAL SIX TO SEVEN CERVICAL ANTERIOR DISC ARTHROPLASTY;  Surgeon: Victory Gens, MD;  Location: [facility];  Service: Neurosurgery;  Laterality: N/A;  C56 C67 artificial disc replacement   CESAREAN SECTION     COLONOSCOPY     DILATATION & CURETTAGE/HYSTEROSCOPY WITH TRUECLEAR N/A 11/25/2013   Procedure: DILATATION & CURETTAGE/HYSTEROSCOPY WITH TRUCLEAR;  Surgeon: Charlie CHRISTELLA Croak, MD;  Location: [facility];  Service: Gynecology;  Laterality: N/A;   ECTOPIC PREGNANCY SURGERY     GALLBLADDER SURGERY     MOUTH SURGERY     child- dog bite   SPINAL FUSION     2023   STERIOD INJECTION Left 09/09/2022   Procedure: LEFT KNEE STEROID INJECTION;  Surgeon: Melodi Lerner, MD;  Location: [facility];  Service: Orthopedics;  Laterality: Left;  left knee injection 0928   TONSILLECTOMY     TOTAL KNEE ARTHROPLASTY Right 09/09/2022   Procedure: TOTAL KNEE ARTHROPLASTY;  Surgeon: Melodi Lerner, MD;  Location: [facility];  Service: Orthopedics;  Laterality: Right;   TUBAL LIGATION     Patient Active Problem List   Diagnosis Date Noted   OA (osteoarthritis) of knee 09/09/2022   Exertional dyspnea 04/17/2022   Elevated coronary artery calcium  score 04/17/2022   Spondylolisthesis at L4-L5 level 12/27/2021   Ductal carcinoma in situ (DCIS) of left breast 01/19/2018   Generalized anxiety disorder 11/07/2017   Depression 11/07/2017   Insomnia 11/07/2017   Cervical spondylosis 03/19/2013    PCP: Norleen Jungling  REFERRING PROVIDER: Lerner Lawn  REFERRING DIAG: R TKA 09/09/22  THERAPY DIAG:  Chronic bilateral back pain, unspecified back location  S/P total knee arthroplasty, right  Stiffness of right knee, not elsewhere classified  Muscle weakness (generalized)  Acute pain of right knee  Rationale for Evaluation and Treatment: Rehabilitation  ONSET DATE: 09/09/22  SUBJECTIVE:   SUBJECTIVE STATEMENT :  Has appointment with back surgeon later this week, left knee is stiff  Patient had right breast revision on 07/17/23 due to scar encapsulation.  She  is returning here due to the back pain.  Reports that she is doing a little better due to being less active with this surgery  Saw Dr. Colon last Friday, has a bulging disc on the right at L2, recommended continuation of PT.  Will have injection in back May 20th R TKA 8/19 L knee steroid injection 09/09/22  PAIN:  Are you having pain? Yes: NPRS scale: 6/10 Pain location: right low back, buttock and reports around to the front of the thigh Pain description: sharp really hurts, ache Aggravating factors: difficulty sleeping, difficulty walking and standing and sitting pain up to 9/10 trying to clean the house recently Relieving factors:  heat, Aleve , tramadol  and methacarbomol, the PT treatment was helping  PRECAUTIONS: None no baths, no pushing up, no weights with arms  RED FLAGS: None   WEIGHT BEARING RESTRICTIONS: No  FALLS:  Has patient fallen in last 6 months? No  LIVING ENVIRONMENT: Lives with: lives with their family and lives with their spouse Lives in: House/apartment Stairs: Yes: External: 3 steps; none Has following equipment at home: Single point cane and Walker - 2 wheeled  OCCUPATION: Not working  PLOF: Independent  PATIENT GOALS: less back pain  NEXT MD VISIT: none for Dr. Colon  OBJECTIVE:   PATIENT SURVEYS:    COGNITION: Overall cognitive status: Within functional limits for tasks assessed      EDEMA:    POSTURE: rounded shoulders and flexed trunk   PALPATION: Very tender in the right lumbar L2-5, some tenderness in the buttock  LOWER EXTREMITY ROM:  Active ROM Right eval   Right 09/26/22 Right 10/02/22  11/01/22 12/09/22  Hip flexion          Hip extension          Hip abduction          Hip adduction          Hip internal rotation          Hip external rotation          Knee flexion 110    82* AROM seated per pt request  78* self AAROM, 76* AROM  PROM 87 AROM 81 PROM 90 AROM 83 AROM 101 PROM 105  Knee extension 0    -16* supine heel prop with quad set  AROM 25 PROM 16 AROM 14 AROM 5 PROM 0  Ankle dorsiflexion          Ankle plantarflexion          Ankle inversion          Ankle eversion           (Blank rows = not tested)  LOWER EXTREMITY MMT:  MMT Right eval Left eval  Hip flexion 3+   Hip extension    Hip abduction    Hip adduction    Hip internal rotation    Hip external rotation    Knee flexion 4+   Knee extension 4+   Ankle dorsiflexion    Ankle plantarflexion    Ankle inversion    Ankle eversion     (Blank rows = not tested)  LUMBAR ROM:  decreased 25% flexion, decreased 75% for extension and side bending, pain was worse with extension and  right side bending. Hooklying is less painful that lying flat on back Mild tightness with pain during HS stretch, tight and painful piriformis bilaterally worse on the right , very weak core  FUNCTIONAL TESTS:  TUG 18 seconds no device  GAIT: No assistive device, slow mild antalgic on the right    TODAY'S TREATMENT:                                                                                                                              DATE:  09/29/23 Gait outside around the building 1.5 laps and then in the building and up and down the stairs Bike level 4 x 6 minutes 5# straight arm pulls with cues for posture and core 10# lats Feet on ball K2C, rotation, bridge, isometric abs STM with the Tgun to the bilateral low back area and into the HS Manual sheet traction gentle  09/25/23 Bike level 5 x 6 minutes Gait outside brisk pace 5# straight arm pulls 20# leg press 10# lats Green tband clamshells Ball b/n knees squeeze STM with the Tgun Manual sheet traction   09/16/23 Gait around the building good pace Bike level 5 x 6 minutes Red tband Row Red tband extension Feet on ball K2C, rotation, bridge, gentle isometric abs Manual sheet traction STM to the low back and buttocks  09/04/23 Bike level 4 x 6 minutes LEg press 20# Stairs step over step Gait outside  Manual sheet traction STM to the right low back and hip Feet on ball k2c, rotation, bridge, PPT  09/01/23 Bike level 4 x 6 minutes Gait outside around the small parking [employer] step over step 2 full flights  Feet on ball K2C, rotation, small bridge, PPT's PAssive stretch LE's Manual sheet traction Discussion about lifting grandchild to protect breast and back   08/28/23 Bike level 4 x 8 minutes Leg curls 25# Leg extension 5# Feet on ball K2C, rotation, bridges, PPT Green tband clamshells Ball b/n knees small bridge STM to the buttock and low back Manual sheet traction  08/25/23 Bike level 4 x 8  minutes Walk outside good pace, SOB and a limp on the left Feet on ball K2C, rotation, bridge, pelvic tilts needed a lot of verbal and tactile cues to get PPT Passive stretch LE's, calves are very tight calves, went over the stretch for home Manual sheet pelvic traction  08/21/23 Evaluation  PATIENT EDUCATION:  Education details: POC and HEP Person educated: Patient Education method: Explanation Education comprehension: verbalized understanding  HOME EXERCISE PROGRAM: Access Code: [facility] Access Code: [email or web address] URL: [email or web address] Date: 08/21/23 Prepared by: Ozell Mainland  Exercises - Hooklying Single Knee to Chest Stretch  - 2 x daily - 7 x weekly - 1 sets - 10 reps - 5 hold - Supine Double Knee to Chest  - 2 x daily - 7 x weekly - 1 sets - 10 reps - 5 hold - Supine Lower Trunk Rotation with PLB  - 2 x daily - 7 x weekly - 1 sets - 10 reps - 5 hold - Supine Posterior Pelvic Tilt  - 2 x daily - 7 x weekly - 1 sets - 10 reps - 3 hold  ASSESSMENT:  CLINICAL IMPRESSION:   Patient is scheduled for left TKA on 11/10/23, she is reporting some bilateral low back pain, she does report that she is trying to do more but due to the recent breast surgery she was held back, I did caution her about doing too much, asked her to slowly ramp up and work on form.  Patient has a right disc herniation at L2-3.   She had the right breast surgery June 26, has some limitations with lifting for this.  She is still having some significant low back pain.  Very tender in the right L2 area.  Fair strength.  Has left knee pain and has pain up to 8/10 with walking has a left TKA scheduled for 11/10/23.  She is going to have a grandchild due August 12. OBJECTIVE IMPAIRMENTS: Abnormal gait, cardiopulmonary status limiting activity, decreased activity tolerance, decreased endurance, decreased mobility, difficulty walking, decreased ROM, decreased strength, increased muscle spasms, impaired  flexibility, improper body mechanics, postural dysfunction, and pain.   REHAB POTENTIAL: Good  CLINICAL DECISION MAKING: Stable/uncomplicated  EVALUATION COMPLEXITY: Low   GOALS: Goals reviewed with patient? Yes  SHORT TERM GOALS: Target date: 09/21/23  Patient will be independent with initial HEP. Goal status: progressing 09/04/23   LONG TERM GOALS: Target date: 01/21/24  Patient will be independent with advanced/ongoing HEP to improve outcomes and carryover.  Goal status: initial  2.  Patient will report at least 75% improvement in R knee pain to improve QOL. And back pain Baseline: 8/10 Goal status:progressing 09/25/23  3.  Patient will increase lumbar ROM 25% Baseline:as noted above Initial.  Met 09/29/23  4.  Patient will demonstrate improved functional LE strength as demonstrated by 5/5. Baseline: see above Goal status: ongoing 09/29/23  5.  Patient will be able to ambulate 1000' without AD and normal gait pattern without increased pain to access community.  Baseline:  Goal status:met 09/16/23  6.  Decrease lumbar pain 50%  Goal status: ongoing 05/01/23, 45% 05/13/23  7.  Report able to put shoes and socks on without pain > 4/10  Progressing 08/28/23  PLAN:  PT FREQUENCY: 2x/week  PT DURATION: 6 months  PLANNED INTERVENTIONS: Therapeutic exercises, Therapeutic activity, Neuromuscular re-education, Balance training, Gait training, Patient/Family education, Self Care, Joint mobilization, Stair training, Electrical stimulation, Cryotherapy, Moist heat, scar mobilization, Vasopneumatic device, and Manual therapy  PLAN FOR NEXT SESSION:  able to do UE activities so initiated see how she is doing   Patient Details  Name: CORAL TIMME MRN: [id number] Date of Birth: Nov 22, 1960 Referring Provider:  Onita Rush, MD  Encounter Date: 09/29/2023   OBADIAH OZELL ORN, PT 09/29/2023, 4:30 PM

## 2023-10-02 ENCOUNTER — Encounter: Payer: Self-pay | Admitting: Physical Therapy

## 2023-10-02 ENCOUNTER — Ambulatory Visit: Admitting: Physical Therapy

## 2023-10-02 DIAGNOSIS — M25561 Pain in right knee: Secondary | ICD-10-CM

## 2023-10-02 DIAGNOSIS — M25661 Stiffness of right knee, not elsewhere classified: Secondary | ICD-10-CM

## 2023-10-02 DIAGNOSIS — G8929 Other chronic pain: Secondary | ICD-10-CM

## 2023-10-02 DIAGNOSIS — Z96651 Presence of right artificial knee joint: Secondary | ICD-10-CM

## 2023-10-02 DIAGNOSIS — R600 Localized edema: Secondary | ICD-10-CM

## 2023-10-02 DIAGNOSIS — M549 Dorsalgia, unspecified: Secondary | ICD-10-CM | POA: Diagnosis not present

## 2023-10-02 DIAGNOSIS — M6281 Muscle weakness (generalized): Secondary | ICD-10-CM

## 2023-10-02 NOTE — Therapy (Signed)
 OUTPATIENT PHYSICAL THERAPY LOWER EXTREMITY TREATMENT    Patient Name: [name] MRN: [id number] DOB:06/15/1960, 63 y.o., female Today's Date: 10/02/2023  END OF SESSION:  PT End of Session - 10/02/23 1543     Visit Number 9    Date for PT Re-Evaluation 11/21/23    Authorization Type [employer]    PT Start Time 1530    PT Stop Time 1613    PT Time Calculation (min) 43 min    Activity Tolerance Patient tolerated treatment well    Behavior During Therapy WFL for tasks assessed/performed            Past Medical History:  Diagnosis Date   Arthritis    Back pain of lumbar region with sciatica    Cancer (HCC)    breast   Depression    Diabetes mellitus, type II (HCC)    Family history of anesthesia complication    grandfather died under anesthesia 70's- had heart issues   H/O carpal tunnel repair 2014   Headache(784.0)    Hyperlipidemia    Hypertension    Hypothyroidism    Medial meniscus tear    PONV (postoperative nausea and vomiting)    Sleep apnea    cpap since 10   Past Surgical History:  Procedure Laterality Date   BILATERAL TOTAL MASTECTOMY WITH AXILLARY LYMPH NODE DISSECTION     BREAST RECONSTRUCTION     CERVICAL DISC ARTHROPLASTY N/A 03/19/2013   Procedure: CERVICAL FIVE TO SIX, CERVICAL SIX TO SEVEN CERVICAL ANTERIOR DISC ARTHROPLASTY;  Surgeon: Victory Gens, MD;  Location: [facility];  Service: Neurosurgery;  Laterality: N/A;  C56 C67 artificial disc replacement   CESAREAN SECTION     COLONOSCOPY     DILATATION & CURETTAGE/HYSTEROSCOPY WITH TRUECLEAR N/A 11/25/2013   Procedure: DILATATION & CURETTAGE/HYSTEROSCOPY WITH TRUCLEAR;  Surgeon: Charlie CHRISTELLA Croak, MD;  Location: [facility];  Service: Gynecology;  Laterality: N/A;   ECTOPIC PREGNANCY SURGERY     GALLBLADDER SURGERY     MOUTH SURGERY     child- dog bite   SPINAL FUSION     2023   STERIOD INJECTION Left 09/09/2022   Procedure: LEFT KNEE STEROID INJECTION;  Surgeon: Melodi Lerner, MD;  Location:  [facility];  Service: Orthopedics;  Laterality: Left;  left knee injection 0928   TONSILLECTOMY     TOTAL KNEE ARTHROPLASTY Right 09/09/2022   Procedure: TOTAL KNEE ARTHROPLASTY;  Surgeon: Melodi Lerner, MD;  Location: [facility];  Service: Orthopedics;  Laterality: Right;   TUBAL LIGATION     Patient Active Problem List   Diagnosis Date Noted   OA (osteoarthritis) of knee 09/09/2022   Exertional dyspnea 04/17/2022   Elevated coronary artery calcium  score 04/17/2022   Spondylolisthesis at L4-L5 level 12/27/2021   Ductal carcinoma in situ (DCIS) of left breast 01/19/2018   Generalized anxiety disorder 11/07/2017   Depression 11/07/2017   Insomnia 11/07/2017   Cervical spondylosis 03/19/2013    PCP: Norleen Jungling  REFERRING PROVIDER: Lerner Lawn  REFERRING DIAG: R TKA 09/09/22  THERAPY DIAG:  Chronic bilateral back pain, unspecified back location  S/P total knee arthroplasty, right  Stiffness of right knee, not elsewhere classified  Muscle weakness (generalized)  Acute pain of right knee  Localized edema  Rationale for Evaluation and Treatment: Rehabilitation  ONSET DATE: 09/09/22  SUBJECTIVE:   SUBJECTIVE STATEMENT :  Had injection in the back on Tuesday, reports right side feels much better, left side still hurting  Patient had right breast revision  on 07/17/23 due to scar encapsulation.  She is returning here due to the back pain.  Reports that she is doing a little better due to being less active with this surgery  Saw Dr. Colon last Friday, has a bulging disc on the right at L2, recommended continuation of PT.  Will have injection in back May 20th R TKA 8/19 L knee steroid injection 09/09/22  PAIN:  Are you having pain? Yes: NPRS scale: 6/10 Pain location: right low back, buttock and reports around to the front of the thigh Pain description: sharp really hurts, ache Aggravating factors: difficulty sleeping, difficulty walking and standing and sitting pain up to 9/10  trying to clean the house recently Relieving factors: heat, Aleve , tramadol  and methacarbomol, the PT treatment was helping  PRECAUTIONS: None no baths, no pushing up, no weights with arms  RED FLAGS: None   WEIGHT BEARING RESTRICTIONS: No  FALLS:  Has patient fallen in last 6 months? No  LIVING ENVIRONMENT: Lives with: lives with their family and lives with their spouse Lives in: House/apartment Stairs: Yes: External: 3 steps; none Has following equipment at home: Single point cane and Walker - 2 wheeled  OCCUPATION: Not working  PLOF: Independent  PATIENT GOALS: less back pain  NEXT MD VISIT: none for Dr. Colon  OBJECTIVE:   PATIENT SURVEYS:    COGNITION: Overall cognitive status: Within functional limits for tasks assessed      EDEMA:    POSTURE: rounded shoulders and flexed trunk   PALPATION: Very tender in the right lumbar L2-5, some tenderness in the buttock  LOWER EXTREMITY ROM:  Active ROM Right eval   Right 09/26/22 Right 10/02/22  11/01/22 12/09/22  Hip flexion          Hip extension          Hip abduction          Hip adduction          Hip internal rotation          Hip external rotation          Knee flexion 110    82* AROM seated per pt request  78* self AAROM, 76* AROM  PROM 87 AROM 81 PROM 90 AROM 83 AROM 101 PROM 105  Knee extension 0    -16* supine heel prop with quad set  AROM 25 PROM 16 AROM 14 AROM 5 PROM 0  Ankle dorsiflexion          Ankle plantarflexion          Ankle inversion          Ankle eversion           (Blank rows = not tested)  LOWER EXTREMITY MMT:  MMT Right eval Left eval  Hip flexion 3+   Hip extension    Hip abduction    Hip adduction    Hip internal rotation    Hip external rotation    Knee flexion 4+   Knee extension 4+   Ankle dorsiflexion    Ankle plantarflexion    Ankle inversion    Ankle eversion     (Blank rows = not tested)  LUMBAR ROM:  decreased 25% flexion, decreased 75% for extension  and side bending, pain was worse with extension and right side bending. Hooklying is less painful that lying flat on back Mild tightness with pain during HS stretch, tight and painful piriformis bilaterally worse on the right , very weak core  FUNCTIONAL TESTS:  TUG 18 seconds no device  GAIT: No assistive device, slow mild antalgic on the right    TODAY'S TREATMENT:                                                                                                                              DATE:  10/02/23 Gait outside around the building, down stairs 2x and up 1x no rest good pace Bike level 5 x 6 minutes 5# straight arm pulls  5# LEg extension 20# HS curls Feet on ball K2C, rotation, posterior activation, isometric abs Passive stretch HS STM to the left lumbar and buttock  09/29/23 Gait outside around the building 1.5 laps and then in the building and up and down the stairs Bike level 4 x 6 minutes 5# straight arm pulls with cues for posture and core 10# lats Feet on ball K2C, rotation, bridge, isometric abs STM with the Tgun to the bilateral low back area and into the HS Manual sheet traction gentle  09/25/23 Bike level 5 x 6 minutes Gait outside brisk pace 5# straight arm pulls 20# leg press 10# lats Green tband clamshells Ball b/n knees squeeze STM with the Tgun Manual sheet traction   09/16/23 Gait around the building good pace Bike level 5 x 6 minutes Red tband Row Red tband extension Feet on ball K2C, rotation, bridge, gentle isometric abs Manual sheet traction STM to the low back and buttocks  09/04/23 Bike level 4 x 6 minutes LEg press 20# Stairs step over step Gait outside  Manual sheet traction STM to the right low back and hip Feet on ball k2c, rotation, bridge, PPT  09/01/23 Bike level 4 x 6 minutes Gait outside around the small parking [employer] step over step 2 full flights  Feet on ball K2C, rotation, small bridge, PPT's PAssive stretch  LE's Manual sheet traction Discussion about lifting grandchild to protect breast and back   08/28/23 Bike level 4 x 8 minutes Leg curls 25# Leg extension 5# Feet on ball K2C, rotation, bridges, PPT Green tband clamshells Ball b/n knees small bridge STM to the buttock and low back Manual sheet traction  08/25/23 Bike level 4 x 8 minutes Walk outside good pace, SOB and a limp on the left Feet on ball K2C, rotation, bridge, pelvic tilts needed a lot of verbal and tactile cues to get PPT Passive stretch LE's, calves are very tight calves, went over the stretch for home Manual sheet pelvic traction  08/21/23 Evaluation  PATIENT EDUCATION:  Education details: POC and HEP Person educated: Patient Education method: Explanation Education comprehension: verbalized understanding  HOME EXERCISE PROGRAM: Access Code: [facility] Access Code: [email or web address] URL: [email or web address] Date: 08/21/23 Prepared by: Ozell Mainland  Exercises - Hooklying Single Knee to Chest Stretch  - 2 x daily - 7 x weekly - 1 sets - 10 reps - 5 hold - Supine Double Knee to Chest  - 2 x daily -  7 x weekly - 1 sets - 10 reps - 5 hold - Supine Lower Trunk Rotation with PLB  - 2 x daily - 7 x weekly - 1 sets - 10 reps - 5 hold - Supine Posterior Pelvic Tilt  - 2 x daily - 7 x weekly - 1 sets - 10 reps - 3 hold  ASSESSMENT:  CLINICAL IMPRESSION:   Patient is scheduled for left TKA on 11/10/23, she is reporting some bilateral low back pain, she had an injection on the right side on Tuesday reports that feels better but still hurting on the left side.  I am currently working to get her strong and education on after TKA  Patient has a right disc herniation at L2-3.   She had the right breast surgery June 26, has some limitations with lifting for this.  She is still having some significant low back pain.  Very tender in the right L2 area.  Fair strength.  Has left knee pain and has pain up to 8/10 with  walking has a left TKA scheduled for 11/10/23.  She is going to have a grandchild due August 12. OBJECTIVE IMPAIRMENTS: Abnormal gait, cardiopulmonary status limiting activity, decreased activity tolerance, decreased endurance, decreased mobility, difficulty walking, decreased ROM, decreased strength, increased muscle spasms, impaired flexibility, improper body mechanics, postural dysfunction, and pain.   REHAB POTENTIAL: Good  CLINICAL DECISION MAKING: Stable/uncomplicated  EVALUATION COMPLEXITY: Low   GOALS: Goals reviewed with patient? Yes  SHORT TERM GOALS: Target date: 09/21/23  Patient will be independent with initial HEP. Goal status:  met 10/02/23   LONG TERM GOALS: Target date: 01/21/24  Patient will be independent with advanced/ongoing HEP to improve outcomes and carryover.  Goal status: initial  2.  Patient will report at least 75% improvement in R knee pain to improve QOL. And back pain Baseline: 8/10 Goal status:progressing 09/25/23  3.  Patient will increase lumbar ROM 25% Baseline:as noted above Initial.  Met 09/29/23  4.  Patient will demonstrate improved functional LE strength as demonstrated by 5/5. Baseline: see above Goal status: ongoing 09/29/23  5.  Patient will be able to ambulate 1000' without AD and normal gait pattern without increased pain to access community.  Baseline:  Goal status:met 09/16/23  6.  Decrease lumbar pain 50%  Goal status: ongoing 05/01/23, 45% 05/13/23  7.  Report able to put shoes and socks on without pain > 4/10  Progressing 08/28/23  PLAN:  PT FREQUENCY: 2x/week  PT DURATION: 6 months  PLANNED INTERVENTIONS: Therapeutic exercises, Therapeutic activity, Neuromuscular re-education, Balance training, Gait training, Patient/Family education, Self Care, Joint mobilization, Stair training, Electrical stimulation, Cryotherapy, Moist heat, scar mobilization, Vasopneumatic device, and Manual therapy  PLAN FOR NEXT SESSION:  able to do UE  activities so initiated see how she is doing   Patient Details  Name: [name] MRN: [id number] Date of Birth: 21-Aug-1960 Referring Provider:  Colon Shove, MD  Encounter Date: 10/02/2023   OBADIAH OZELL ORN, PT 10/02/2023, 3:44 PM

## 2023-10-06 ENCOUNTER — Ambulatory Visit: Admitting: Physical Therapy

## 2023-10-06 ENCOUNTER — Encounter: Payer: Self-pay | Admitting: Physical Therapy

## 2023-10-06 DIAGNOSIS — M25561 Pain in right knee: Secondary | ICD-10-CM

## 2023-10-06 DIAGNOSIS — M25661 Stiffness of right knee, not elsewhere classified: Secondary | ICD-10-CM

## 2023-10-06 DIAGNOSIS — M6281 Muscle weakness (generalized): Secondary | ICD-10-CM

## 2023-10-06 DIAGNOSIS — M549 Dorsalgia, unspecified: Secondary | ICD-10-CM | POA: Diagnosis not present

## 2023-10-06 DIAGNOSIS — R600 Localized edema: Secondary | ICD-10-CM

## 2023-10-06 DIAGNOSIS — Z96651 Presence of right artificial knee joint: Secondary | ICD-10-CM

## 2023-10-06 DIAGNOSIS — R2689 Other abnormalities of gait and mobility: Secondary | ICD-10-CM

## 2023-10-06 DIAGNOSIS — G8929 Other chronic pain: Secondary | ICD-10-CM

## 2023-10-06 NOTE — Therapy (Signed)
 OUTPATIENT PHYSICAL THERAPY LOWER EXTREMITY TREATMENT    Patient Name: [name] MRN: [id number] DOB:1960/07/11, 63 y.o., female Today's Date: 10/06/2023  END OF SESSION:  PT End of Session - 10/06/23 1535     Visit Number 10    Date for PT Re-Evaluation 11/21/23    Authorization Type [employer]    PT Start Time 1530    PT Stop Time 1615    PT Time Calculation (min) 45 min    Activity Tolerance Patient tolerated treatment well    Behavior During Therapy WFL for tasks assessed/performed            Past Medical History:  Diagnosis Date   Arthritis    Back pain of lumbar region with sciatica    Cancer (HCC)    breast   Depression    Diabetes mellitus, type II (HCC)    Family history of anesthesia complication    grandfather died under anesthesia 70's- had heart issues   H/O carpal tunnel repair 2014   Headache(784.0)    Hyperlipidemia    Hypertension    Hypothyroidism    Medial meniscus tear    PONV (postoperative nausea and vomiting)    Sleep apnea    cpap since 10   Past Surgical History:  Procedure Laterality Date   BILATERAL TOTAL MASTECTOMY WITH AXILLARY LYMPH NODE DISSECTION     BREAST RECONSTRUCTION     CERVICAL DISC ARTHROPLASTY N/A 03/19/2013   Procedure: CERVICAL FIVE TO SIX, CERVICAL SIX TO SEVEN CERVICAL ANTERIOR DISC ARTHROPLASTY;  Surgeon: Victory Gens, MD;  Location: [facility];  Service: Neurosurgery;  Laterality: N/A;  C56 C67 artificial disc replacement   CESAREAN SECTION     COLONOSCOPY     DILATATION & CURETTAGE/HYSTEROSCOPY WITH TRUECLEAR N/A 11/25/2013   Procedure: DILATATION & CURETTAGE/HYSTEROSCOPY WITH TRUCLEAR;  Surgeon: Charlie CHRISTELLA Croak, MD;  Location: [facility];  Service: Gynecology;  Laterality: N/A;   ECTOPIC PREGNANCY SURGERY     GALLBLADDER SURGERY     MOUTH SURGERY     child- dog bite   SPINAL FUSION     2023   STERIOD INJECTION Left 09/09/2022   Procedure: LEFT KNEE STEROID INJECTION;  Surgeon: Melodi Lerner, MD;  Location:  [facility];  Service: Orthopedics;  Laterality: Left;  left knee injection 0928   TONSILLECTOMY     TOTAL KNEE ARTHROPLASTY Right 09/09/2022   Procedure: TOTAL KNEE ARTHROPLASTY;  Surgeon: Melodi Lerner, MD;  Location: [facility];  Service: Orthopedics;  Laterality: Right;   TUBAL LIGATION     Patient Active Problem List   Diagnosis Date Noted   OA (osteoarthritis) of knee 09/09/2022   Exertional dyspnea 04/17/2022   Elevated coronary artery calcium  score 04/17/2022   Spondylolisthesis at L4-L5 level 12/27/2021   Ductal carcinoma in situ (DCIS) of left breast 01/19/2018   Generalized anxiety disorder 11/07/2017   Depression 11/07/2017   Insomnia 11/07/2017   Cervical spondylosis 03/19/2013    PCP: Norleen Jungling  REFERRING PROVIDER: Lerner Lawn  REFERRING DIAG: R TKA 09/09/22  THERAPY DIAG:  Chronic bilateral back pain, unspecified back location  S/P total knee arthroplasty, right  Stiffness of right knee, not elsewhere classified  Muscle weakness (generalized)  Acute pain of right knee  Localized edema  Other abnormalities of gait and mobility  Rationale for Evaluation and Treatment: Rehabilitation  ONSET DATE: 09/09/22  SUBJECTIVE:   SUBJECTIVE STATEMENT :  Still hurting in the back, called Dr Gens  Patient had right breast revision on 07/17/23  due to scar encapsulation.  She is returning here due to the back pain.  Reports that she is doing a little better due to being less active with this surgery  Saw Dr. Colon last Friday, has a bulging disc on the right at L2, recommended continuation of PT.  Will have injection in back May 20th R TKA 8/19 L knee steroid injection 09/09/22  PAIN:  Are you having pain? Yes: NPRS scale: 6/10 Pain location: right low back, buttock and reports around to the front of the thigh Pain description: sharp really hurts, ache Aggravating factors: difficulty sleeping, difficulty walking and standing and sitting pain up to 9/10 trying to clean  the house recently Relieving factors: heat, Aleve , tramadol  and methacarbomol, the PT treatment was helping  PRECAUTIONS: None no baths, no pushing up, no weights with arms  RED FLAGS: None   WEIGHT BEARING RESTRICTIONS: No  FALLS:  Has patient fallen in last 6 months? No  LIVING ENVIRONMENT: Lives with: lives with their family and lives with their spouse Lives in: House/apartment Stairs: Yes: External: 3 steps; none Has following equipment at home: Single point cane and Walker - 2 wheeled  OCCUPATION: Not working  PLOF: Independent  PATIENT GOALS: less back pain  NEXT MD VISIT: none for Dr. Colon  OBJECTIVE:   PATIENT SURVEYS:    COGNITION: Overall cognitive status: Within functional limits for tasks assessed      EDEMA:    POSTURE: rounded shoulders and flexed trunk   PALPATION: Very tender in the right lumbar L2-5, some tenderness in the buttock  LOWER EXTREMITY ROM:  Active ROM Right eval   Right 09/26/22 Right 10/02/22  11/01/22 12/09/22  Hip flexion          Hip extension          Hip abduction          Hip adduction          Hip internal rotation          Hip external rotation          Knee flexion 110    82* AROM seated per pt request  78* self AAROM, 76* AROM  PROM 87 AROM 81 PROM 90 AROM 83 AROM 101 PROM 105  Knee extension 0    -16* supine heel prop with quad set  AROM 25 PROM 16 AROM 14 AROM 5 PROM 0  Ankle dorsiflexion          Ankle plantarflexion          Ankle inversion          Ankle eversion           (Blank rows = not tested)  LOWER EXTREMITY MMT:  MMT Right eval Left eval  Hip flexion 3+   Hip extension    Hip abduction    Hip adduction    Hip internal rotation    Hip external rotation    Knee flexion 4+   Knee extension 4+   Ankle dorsiflexion    Ankle plantarflexion    Ankle inversion    Ankle eversion     (Blank rows = not tested)  LUMBAR ROM:  decreased 25% flexion, decreased 75% for extension and side  bending, pain was worse with extension and right side bending. Hooklying is less painful that lying flat on back Mild tightness with pain during HS stretch, tight and painful piriformis bilaterally worse on the right , very weak core  FUNCTIONAL TESTS:  TUG 18 seconds no device  GAIT: No assistive device, slow mild antalgic on the right    TODAY'S TREATMENT:                                                                                                                              DATE:  10/06/23 Bike level 5 x 6 minutes Gait around the building no rest brisk pace Seated rows 20# LAts 20# HS curls 25# PAssive stretch LE's Manual sheet traction STM with the Tgun to the left low back and buttock  10/02/23 Gait outside around the building, down stairs 2x and up 1x no rest good pace Bike level 5 x 6 minutes 5# straight arm pulls  5# LEg extension 20# HS curls Feet on ball K2C, rotation, posterior activation, isometric abs Passive stretch HS STM to the left lumbar and buttock  09/29/23 Gait outside around the building 1.5 laps and then in the building and up and down the stairs Bike level 4 x 6 minutes 5# straight arm pulls with cues for posture and core 10# lats Feet on ball K2C, rotation, bridge, isometric abs STM with the Tgun to the bilateral low back area and into the HS Manual sheet traction gentle  09/25/23 Bike level 5 x 6 minutes Gait outside brisk pace 5# straight arm pulls 20# leg press 10# lats Green tband clamshells Ball b/n knees squeeze STM with the Tgun Manual sheet traction   09/16/23 Gait around the building good pace Bike level 5 x 6 minutes Red tband Row Red tband extension Feet on ball K2C, rotation, bridge, gentle isometric abs Manual sheet traction STM to the low back and buttocks  09/04/23 Bike level 4 x 6 minutes LEg press 20# Stairs step over step Gait outside  Manual sheet traction STM to the right low back and hip Feet on ball k2c,  rotation, bridge, PPT  09/01/23 Bike level 4 x 6 minutes Gait outside around the small parking [employer] step over step 2 full flights  Feet on ball K2C, rotation, small bridge, PPT's PAssive stretch LE's Manual sheet traction Discussion about lifting grandchild to protect breast and back   08/28/23 Bike level 4 x 8 minutes Leg curls 25# Leg extension 5# Feet on ball K2C, rotation, bridges, PPT Green tband clamshells Ball b/n knees small bridge STM to the buttock and low back Manual sheet traction  08/25/23 Bike level 4 x 8 minutes Walk outside good pace, SOB and a limp on the left Feet on ball K2C, rotation, bridge, pelvic tilts needed a lot of verbal and tactile cues to get PPT Passive stretch LE's, calves are very tight calves, went over the stretch for home Manual sheet pelvic traction  08/21/23 Evaluation  PATIENT EDUCATION:  Education details: POC and HEP Person educated: Patient Education method: Explanation Education comprehension: verbalized understanding  HOME EXERCISE PROGRAM: Access Code: [facility] Access Code: [email or web address] URL: [email or web address] Date: 08/21/23 Prepared by: Ozell Mainland  Exercises - Hooklying Single Knee to Chest Stretch  - 2 x daily - 7 x weekly - 1 sets - 10 reps - 5 hold - Supine Double Knee to Chest  - 2 x daily - 7 x weekly - 1 sets - 10 reps - 5 hold - Supine Lower Trunk Rotation with PLB  - 2 x daily - 7 x weekly - 1 sets - 10 reps - 5 hold - Supine Posterior Pelvic Tilt  - 2 x daily - 7 x weekly - 1 sets - 10 reps - 3 hold  ASSESSMENT:  CLINICAL IMPRESSION:   Patient is scheduled for left TKA on 11/10/23, she is reporting some bilateral low back pain, still with left low back pain.  Some left knee pain.  Patient has a right disc herniation at L2-3.   She had the right breast surgery June 26, has some limitations with lifting for this.  She is still having some significant low back pain.  Very tender in the  right L2 area.  Fair strength.  Has left knee pain and has pain up to 8/10 with walking has a left TKA scheduled for 11/10/23.  She is going to have a grandchild due August 12. OBJECTIVE IMPAIRMENTS: Abnormal gait, cardiopulmonary status limiting activity, decreased activity tolerance, decreased endurance, decreased mobility, difficulty walking, decreased ROM, decreased strength, increased muscle spasms, impaired flexibility, improper body mechanics, postural dysfunction, and pain.   REHAB POTENTIAL: Good  CLINICAL DECISION MAKING: Stable/uncomplicated  EVALUATION COMPLEXITY: Low   GOALS: Goals reviewed with patient? Yes  SHORT TERM GOALS: Target date: 09/21/23  Patient will be independent with initial HEP. Goal status:  met 10/02/23   LONG TERM GOALS: Target date: 01/21/24  Patient will be independent with advanced/ongoing HEP to improve outcomes and carryover.  Goal status: initial  2.  Patient will report at least 75% improvement in R knee pain to improve QOL. And back pain Baseline: 8/10 Goal status:progressing 09/25/23  3.  Patient will increase lumbar ROM 25% Baseline:as noted above Initial.  Met 09/29/23  4.  Patient will demonstrate improved functional LE strength as demonstrated by 5/5. Baseline: see above Goal status: ongoing 09/29/23  5.  Patient will be able to ambulate 1000' without AD and normal gait pattern without increased pain to access community.  Baseline:  Goal status:met 09/16/23  6.  Decrease lumbar pain 50%  Goal status: ongoing 05/01/23, 45% 05/13/23  7.  Report able to put shoes and socks on without pain > 4/10  Progressing 08/28/23  PLAN:  PT FREQUENCY: 2x/week  PT DURATION: 6 months  PLANNED INTERVENTIONS: Therapeutic exercises, Therapeutic activity, Neuromuscular re-education, Balance training, Gait training, Patient/Family education, Self Care, Joint mobilization, Stair training, Electrical stimulation, Cryotherapy, Moist heat, scar mobilization,  Vasopneumatic device, and Manual therapy  PLAN FOR NEXT SESSION:  able to do UE activities so initiated see how she is doing   Patient Details  Name: [name] MRN: [id number] Date of Birth: Nov 15, 1960 Referring Provider:  Colon Shove, MD  Encounter Date: 10/06/2023   OBADIAH OZELL ORN, PT 10/06/2023, 3:36 PM

## 2023-10-08 ENCOUNTER — Encounter: Payer: Self-pay | Admitting: Physical Therapy

## 2023-10-08 ENCOUNTER — Ambulatory Visit: Admitting: Physical Therapy

## 2023-10-08 DIAGNOSIS — M549 Dorsalgia, unspecified: Secondary | ICD-10-CM | POA: Diagnosis not present

## 2023-10-08 DIAGNOSIS — M6281 Muscle weakness (generalized): Secondary | ICD-10-CM

## 2023-10-08 DIAGNOSIS — Z96651 Presence of right artificial knee joint: Secondary | ICD-10-CM

## 2023-10-08 DIAGNOSIS — G8929 Other chronic pain: Secondary | ICD-10-CM

## 2023-10-08 DIAGNOSIS — M25561 Pain in right knee: Secondary | ICD-10-CM

## 2023-10-08 DIAGNOSIS — M25661 Stiffness of right knee, not elsewhere classified: Secondary | ICD-10-CM

## 2023-10-08 NOTE — Therapy (Signed)
 OUTPATIENT PHYSICAL THERAPY LOWER EXTREMITY TREATMENT    Patient Name: [name] MRN: [id number] DOB:[birth year], 63 y.o., female Today's Date: 10/08/2023  END OF SESSION:  PT End of Session - 10/08/23 1534     Visit Number 11    Date for PT Re-Evaluation 11/21/23    Authorization Type [employer]    PT Start Time 1530    PT Stop Time 1615    PT Time Calculation (min) 45 min    Activity Tolerance Patient tolerated treatment well    Behavior During Therapy WFL for tasks assessed/performed            Past Medical History:  Diagnosis Date   Arthritis    Back pain of lumbar region with sciatica    Cancer (HCC)    breast   Depression    Diabetes mellitus, type II (HCC)    Family history of anesthesia complication    grandfather died under anesthesia 70's- had heart issues   H/O carpal tunnel repair 2014   Headache(784.0)    Hyperlipidemia    Hypertension    Hypothyroidism    Medial meniscus tear    PONV (postoperative nausea and vomiting)    Sleep apnea    cpap since 10   Past Surgical History:  Procedure Laterality Date   BILATERAL TOTAL MASTECTOMY WITH AXILLARY LYMPH NODE DISSECTION     BREAST RECONSTRUCTION     CERVICAL DISC ARTHROPLASTY N/A 03/19/2013   Procedure: CERVICAL FIVE TO SIX, CERVICAL SIX TO SEVEN CERVICAL ANTERIOR DISC ARTHROPLASTY;  Surgeon: Victory Gens, MD;  Location: [facility];  Service: Neurosurgery;  Laterality: N/A;  C56 C67 artificial disc replacement   CESAREAN SECTION     COLONOSCOPY     DILATATION & CURETTAGE/HYSTEROSCOPY WITH TRUECLEAR N/A 11/25/2013   Procedure: DILATATION & CURETTAGE/HYSTEROSCOPY WITH TRUCLEAR;  Surgeon: Charlie CHRISTELLA Croak, MD;  Location: [facility];  Service: Gynecology;  Laterality: N/A;   ECTOPIC PREGNANCY SURGERY     GALLBLADDER SURGERY     MOUTH SURGERY     child- dog bite   SPINAL FUSION     2023   STERIOD INJECTION Left 09/09/2022   Procedure: LEFT KNEE STEROID INJECTION;  Surgeon: Melodi Lerner, MD;  Location:  [facility];  Service: Orthopedics;  Laterality: Left;  left knee injection 0928   TONSILLECTOMY     TOTAL KNEE ARTHROPLASTY Right 09/09/2022   Procedure: TOTAL KNEE ARTHROPLASTY;  Surgeon: Melodi Lerner, MD;  Location: [facility];  Service: Orthopedics;  Laterality: Right;   TUBAL LIGATION     Patient Active Problem List   Diagnosis Date Noted   OA (osteoarthritis) of knee 09/09/2022   Exertional dyspnea 04/17/2022   Elevated coronary artery calcium  score 04/17/2022   Spondylolisthesis at L4-L5 level 12/27/2021   Ductal carcinoma in situ (DCIS) of left breast 01/19/2018   Generalized anxiety disorder 11/07/2017   Depression 11/07/2017   Insomnia 11/07/2017   Cervical spondylosis 03/19/2013    PCP: Norleen Jungling  REFERRING PROVIDER: Lerner Lawn  REFERRING DIAG: R TKA 09/09/22  THERAPY DIAG:  Chronic bilateral back pain, unspecified back location  S/P total knee arthroplasty, right  Stiffness of right knee, not elsewhere classified  Muscle weakness (generalized)  Acute pain of right knee  Rationale for Evaluation and Treatment: Rehabilitation  ONSET DATE: 09/09/22  SUBJECTIVE:   SUBJECTIVE STATEMENT :  Doing okay, driving to [facility], have not heard from Dr. Gens yet.  Patient had right breast revision on 07/17/23 due to scar encapsulation.  She is returning here due to the back pain.  Reports that she is doing a little better due to being less active with this surgery  Saw Dr. Colon last Friday, has a bulging disc on the right at L2, recommended continuation of PT.  Will have injection in back May 20th R TKA 8/19 L knee steroid injection 09/09/22  PAIN:  Are you having pain? Yes: NPRS scale: 6/10 Pain location: right low back, buttock and reports around to the front of the thigh Pain description: sharp really hurts, ache Aggravating factors: difficulty sleeping, difficulty walking and standing and sitting pain up to 9/10 trying to clean the house  recently Relieving factors: heat, Aleve , tramadol  and methacarbomol, the PT treatment was helping  PRECAUTIONS: None no baths, no pushing up, no weights with arms  RED FLAGS: None   WEIGHT BEARING RESTRICTIONS: No  FALLS:  Has patient fallen in last 6 months? No  LIVING ENVIRONMENT: Lives with: lives with their family and lives with their spouse Lives in: House/apartment Stairs: Yes: External: 3 steps; none Has following equipment at home: Single point cane and Walker - 2 wheeled  OCCUPATION: Not working  PLOF: Independent  PATIENT GOALS: less back pain  NEXT MD VISIT: none for Dr. Colon  OBJECTIVE:   PATIENT SURVEYS:    COGNITION: Overall cognitive status: Within functional limits for tasks assessed      EDEMA:    POSTURE: rounded shoulders and flexed trunk   PALPATION: Very tender in the right lumbar L2-5, some tenderness in the buttock  LOWER EXTREMITY ROM:  Active ROM Right eval   Right 09/26/22 Right 10/02/22  11/01/22 12/09/22  Hip flexion          Hip extension          Hip abduction          Hip adduction          Hip internal rotation          Hip external rotation          Knee flexion 110    82* AROM seated per pt request  78* self AAROM, 76* AROM  PROM 87 AROM 81 PROM 90 AROM 83 AROM 101 PROM 105  Knee extension 0    -16* supine heel prop with quad set  AROM 25 PROM 16 AROM 14 AROM 5 PROM 0  Ankle dorsiflexion          Ankle plantarflexion          Ankle inversion          Ankle eversion           (Blank rows = not tested)  LOWER EXTREMITY MMT:  MMT Right eval Left eval  Hip flexion 3+   Hip extension    Hip abduction    Hip adduction    Hip internal rotation    Hip external rotation    Knee flexion 4+   Knee extension 4+   Ankle dorsiflexion    Ankle plantarflexion    Ankle inversion    Ankle eversion     (Blank rows = not tested)  LUMBAR ROM:  decreased 25% flexion, decreased 75% for extension and side bending, pain  was worse with extension and right side bending. Hooklying is less painful that lying flat on back Mild tightness with pain during HS stretch, tight and painful piriformis bilaterally worse on the right , very weak core  FUNCTIONAL TESTS:  TUG 18 seconds no  device  GAIT: No assistive device, slow mild antalgic on the right    TODAY'S TREATMENT:                                                                                                                              DATE:  10/08/23 Bike level 5 x 6 minutes Feet on ball K2C, rotation, small bridge, isometric abs Passive stretch LE's STM to the left low back and buttock Manual sheet traction  10/06/23 Bike level 5 x 6 minutes Gait around the building no rest brisk pace Seated rows 20# LAts 20# HS curls 25# PAssive stretch LE's Manual sheet traction STM with the Tgun to the left low back and buttock  10/02/23 Gait outside around the building, down stairs 2x and up 1x no rest good pace Bike level 5 x 6 minutes 5# straight arm pulls  5# LEg extension 20# HS curls Feet on ball K2C, rotation, posterior activation, isometric abs Passive stretch HS STM to the left lumbar and buttock  09/29/23 Gait outside around the building 1.5 laps and then in the building and up and down the stairs Bike level 4 x 6 minutes 5# straight arm pulls with cues for posture and core 10# lats Feet on ball K2C, rotation, bridge, isometric abs STM with the Tgun to the bilateral low back area and into the HS Manual sheet traction gentle  09/25/23 Bike level 5 x 6 minutes Gait outside brisk pace 5# straight arm pulls 20# leg press 10# lats Green tband clamshells Ball b/n knees squeeze STM with the Tgun Manual sheet traction   09/16/23 Gait around the building good pace Bike level 5 x 6 minutes Red tband Row Red tband extension Feet on ball K2C, rotation, bridge, gentle isometric abs Manual sheet traction STM to the low back and  buttocks  09/04/23 Bike level 4 x 6 minutes LEg press 20# Stairs step over step Gait outside  Manual sheet traction STM to the right low back and hip Feet on ball k2c, rotation, bridge, PPT  09/01/23 Bike level 4 x 6 minutes Gait outside around the small parking [employer] step over step 2 full flights  Feet on ball K2C, rotation, small bridge, PPT's PAssive stretch LE's Manual sheet traction Discussion about lifting grandchild to protect breast and back   08/28/23 Bike level 4 x 8 minutes Leg curls 25# Leg extension 5# Feet on ball K2C, rotation, bridges, PPT Green tband clamshells Ball b/n knees small bridge STM to the buttock and low back Manual sheet traction  08/25/23 Bike level 4 x 8 minutes Walk outside good pace, SOB and a limp on the left Feet on ball K2C, rotation, bridge, pelvic tilts needed a lot of verbal and tactile cues to get PPT Passive stretch LE's, calves are very tight calves, went over the stretch for home Manual sheet pelvic traction  08/21/23 Evaluation  PATIENT EDUCATION:  Education details: POC and HEP Person educated:  Patient Education method: Explanation Education comprehension: verbalized understanding  HOME EXERCISE PROGRAM: Access Code: [email or web address] Access Code: [email or web address] URL: [email or web address] Date: 08/21/23 Prepared by: Ozell Mainland  Exercises - Hooklying Single Knee to Chest Stretch  - 2 x daily - 7 x weekly - 1 sets - 10 reps - 5 hold - Supine Double Knee to Chest  - 2 x daily - 7 x weekly - 1 sets - 10 reps - 5 hold - Supine Lower Trunk Rotation with PLB  - 2 x daily - 7 x weekly - 1 sets - 10 reps - 5 hold - Supine Posterior Pelvic Tilt  - 2 x daily - 7 x weekly - 1 sets - 10 reps - 3 hold  ASSESSMENT:  CLINICAL IMPRESSION:   Patient is scheduled for left TKA on 11/10/23, she is reporting some bilateral low back pain, still with left low back pain.  Some left knee pain.  Worked on the spasms of the back,  did traction, she is very tight in the left lumbar p-spinals  Patient has a right disc herniation at L2-3.   She had the right breast surgery June 26, has some limitations with lifting for this.  She is still having some significant low back pain.  Very tender in the right L2 area.  Fair strength.  Has left knee pain and has pain up to 8/10 with walking has a left TKA scheduled for 11/10/23.  She is going to have a grandchild due August 12. OBJECTIVE IMPAIRMENTS: Abnormal gait, cardiopulmonary status limiting activity, decreased activity tolerance, decreased endurance, decreased mobility, difficulty walking, decreased ROM, decreased strength, increased muscle spasms, impaired flexibility, improper body mechanics, postural dysfunction, and pain.   REHAB POTENTIAL: Good  CLINICAL DECISION MAKING: Stable/uncomplicated  EVALUATION COMPLEXITY: Low   GOALS: Goals reviewed with patient? Yes  SHORT TERM GOALS: Target date: 09/21/23  Patient will be independent with initial HEP. Goal status:  met 10/02/23   LONG TERM GOALS: Target date: 01/21/24  Patient will be independent with advanced/ongoing HEP to improve outcomes and carryover.  Goal status: initial  2.  Patient will report at least 75% improvement in R knee pain to improve QOL. And back pain Baseline: 8/10 Goal status:progressing 10/08/23  3.  Patient will increase lumbar ROM 25% Baseline:as noted above Initial.  Met 09/29/23  4.  Patient will demonstrate improved functional LE strength as demonstrated by 5/5. Baseline: see above Goal status: progressing 10/08/23  5.  Patient will be able to ambulate 1000' without AD and normal gait pattern without increased pain to access community.  Baseline:  Goal status:met 09/16/23  6.  Decrease lumbar pain 50%  Goal status: ongoing 05/01/23, 45% 05/13/23  7.  Report able to put shoes and socks on without pain > 4/10  Progressing 10/08/23  PLAN:  PT FREQUENCY: 2x/week  PT DURATION: 6  months  PLANNED INTERVENTIONS: Therapeutic exercises, Therapeutic activity, Neuromuscular re-education, Balance training, Gait training, Patient/Family education, Self Care, Joint mobilization, Stair training, Electrical stimulation, Cryotherapy, Moist heat, scar mobilization, Vasopneumatic device, and Manual therapy  PLAN FOR NEXT SESSION:  able to do UE activities so initiated see how she is doing   Patient Details  Name: [name] MRN: [id number] Date of Birth: 1960/09/08 Referring Provider:  Colon Shove, MD  Encounter Date: 10/08/2023   MAINLAND OZELL ORN, PT 10/08/2023, 3:35 PM

## 2023-10-13 ENCOUNTER — Ambulatory Visit: Admitting: Physical Therapy

## 2023-10-14 ENCOUNTER — Encounter: Payer: Self-pay | Admitting: Physical Therapy

## 2023-10-14 ENCOUNTER — Ambulatory Visit: Admitting: Physical Therapy

## 2023-10-14 DIAGNOSIS — M25561 Pain in right knee: Secondary | ICD-10-CM

## 2023-10-14 DIAGNOSIS — G8929 Other chronic pain: Secondary | ICD-10-CM

## 2023-10-14 DIAGNOSIS — R2689 Other abnormalities of gait and mobility: Secondary | ICD-10-CM

## 2023-10-14 DIAGNOSIS — M6281 Muscle weakness (generalized): Secondary | ICD-10-CM

## 2023-10-14 DIAGNOSIS — R600 Localized edema: Secondary | ICD-10-CM

## 2023-10-14 DIAGNOSIS — M549 Dorsalgia, unspecified: Secondary | ICD-10-CM | POA: Diagnosis not present

## 2023-10-14 DIAGNOSIS — Z96651 Presence of right artificial knee joint: Secondary | ICD-10-CM

## 2023-10-14 DIAGNOSIS — M25661 Stiffness of right knee, not elsewhere classified: Secondary | ICD-10-CM

## 2023-10-14 NOTE — Therapy (Signed)
 OUTPATIENT PHYSICAL THERAPY LOWER EXTREMITY TREATMENT    Patient Name: [name] MRN: [id number] DOB:Jun 04, 1960, 63 y.o., female Today's Date: 10/14/2023  END OF SESSION:  PT End of Session - 10/14/23 1457     Visit Number 12    Date for Recertification  11/21/23    Authorization Type [employer]    PT Start Time 1445    PT Stop Time 1530    PT Time Calculation (min) 45 min    Activity Tolerance Patient tolerated treatment well    Behavior During Therapy WFL for tasks assessed/performed            Past Medical History:  Diagnosis Date   Arthritis    Back pain of lumbar region with sciatica    Cancer (HCC)    breast   Depression    Diabetes mellitus, type II (HCC)    Family history of anesthesia complication    grandfather died under anesthesia 70's- had heart issues   H/O carpal tunnel repair 2014   Headache(784.0)    Hyperlipidemia    Hypertension    Hypothyroidism    Medial meniscus tear    PONV (postoperative nausea and vomiting)    Sleep apnea    cpap since 10   Past Surgical History:  Procedure Laterality Date   BILATERAL TOTAL MASTECTOMY WITH AXILLARY LYMPH NODE DISSECTION     BREAST RECONSTRUCTION     CERVICAL DISC ARTHROPLASTY N/A 03/19/2013   Procedure: CERVICAL FIVE TO SIX, CERVICAL SIX TO SEVEN CERVICAL ANTERIOR DISC ARTHROPLASTY;  Surgeon: Victory Gens, MD;  Location: [facility];  Service: Neurosurgery;  Laterality: N/A;  C56 C67 artificial disc replacement   CESAREAN SECTION     COLONOSCOPY     DILATATION & CURETTAGE/HYSTEROSCOPY WITH TRUECLEAR N/A 11/25/2013   Procedure: DILATATION & CURETTAGE/HYSTEROSCOPY WITH TRUCLEAR;  Surgeon: Charlie CHRISTELLA Croak, MD;  Location: [facility];  Service: Gynecology;  Laterality: N/A;   ECTOPIC PREGNANCY SURGERY     GALLBLADDER SURGERY     MOUTH SURGERY     child- dog bite   SPINAL FUSION     2023   STERIOD INJECTION Left 09/09/2022   Procedure: LEFT KNEE STEROID INJECTION;  Surgeon: Melodi Lerner, MD;  Location:  [facility];  Service: Orthopedics;  Laterality: Left;  left knee injection 0928   TONSILLECTOMY     TOTAL KNEE ARTHROPLASTY Right 09/09/2022   Procedure: TOTAL KNEE ARTHROPLASTY;  Surgeon: Melodi Lerner, MD;  Location: [facility];  Service: Orthopedics;  Laterality: Right;   TUBAL LIGATION     Patient Active Problem List   Diagnosis Date Noted   OA (osteoarthritis) of knee 09/09/2022   Exertional dyspnea 04/17/2022   Elevated coronary artery calcium  score 04/17/2022   Spondylolisthesis at L4-L5 level 12/27/2021   Ductal carcinoma in situ (DCIS) of left breast 01/19/2018   Generalized anxiety disorder 11/07/2017   Depression 11/07/2017   Insomnia 11/07/2017   Cervical spondylosis 03/19/2013    PCP: Norleen Jungling  REFERRING PROVIDER: Lerner Lawn  REFERRING DIAG: R TKA 09/09/22  THERAPY DIAG:  Chronic bilateral back pain, unspecified back location  S/P total knee arthroplasty, right  Stiffness of right knee, not elsewhere classified  Muscle weakness (generalized)  Acute pain of right knee  Localized edema  Other abnormalities of gait and mobility  Rationale for Evaluation and Treatment: Rehabilitation  ONSET DATE: 09/09/22  SUBJECTIVE:   SUBJECTIVE STATEMENT :  Patient was away this weekend, and reports that she felt pretty good, heard from Dr. Gens,  nothing seen on the MRI that he thought could be causing problem  She does report feeling a little less pain  Patient had right breast revision on 07/17/23 due to scar encapsulation.  She is returning here due to the back pain.  Reports that she is doing a little better due to being less active with this surgery  Saw Dr. Colon last Friday, has a bulging disc on the right at L2, recommended continuation of PT.  Will have injection in back May 20th R TKA 8/19 L knee steroid injection 09/09/22  PAIN:  Are you having pain? Yes: NPRS scale: 6/10 Pain location: right low back, buttock and reports around to the front of the  thigh Pain description: sharp really hurts, ache Aggravating factors: difficulty sleeping, difficulty walking and standing and sitting pain up to 9/10 trying to clean the house recently Relieving factors: heat, Aleve , tramadol  and methacarbomol, the PT treatment was helping  PRECAUTIONS: None no baths, no pushing up, no weights with arms  RED FLAGS: None   WEIGHT BEARING RESTRICTIONS: No  FALLS:  Has patient fallen in last 6 months? No  LIVING ENVIRONMENT: Lives with: lives with their family and lives with their spouse Lives in: House/apartment Stairs: Yes: External: 3 steps; none Has following equipment at home: Single point cane and Walker - 2 wheeled  OCCUPATION: Not working  PLOF: Independent  PATIENT GOALS: less back pain  NEXT MD VISIT: none for Dr. Colon  OBJECTIVE:   PATIENT SURVEYS:    COGNITION: Overall cognitive status: Within functional limits for tasks assessed      EDEMA:    POSTURE: rounded shoulders and flexed trunk   PALPATION: Very tender in the right lumbar L2-5, some tenderness in the buttock  LOWER EXTREMITY ROM:  Active ROM Right eval   Right 09/26/22 Right 10/02/22  11/01/22 12/09/22  Hip flexion          Hip extension          Hip abduction          Hip adduction          Hip internal rotation          Hip external rotation          Knee flexion 110    82* AROM seated per pt request  78* self AAROM, 76* AROM  PROM 87 AROM 81 PROM 90 AROM 83 AROM 101 PROM 105  Knee extension 0    -16* supine heel prop with quad set  AROM 25 PROM 16 AROM 14 AROM 5 PROM 0  Ankle dorsiflexion          Ankle plantarflexion          Ankle inversion          Ankle eversion           (Blank rows = not tested)  LOWER EXTREMITY MMT:  MMT Right eval Left eval  Hip flexion 3+   Hip extension    Hip abduction    Hip adduction    Hip internal rotation    Hip external rotation    Knee flexion 4+   Knee extension 4+   Ankle dorsiflexion     Ankle plantarflexion    Ankle inversion    Ankle eversion     (Blank rows = not tested)  LUMBAR ROM:  decreased 25% flexion, decreased 75% for extension and side bending, pain was worse with extension and right side bending. Hooklying is less  painful that lying flat on back Mild tightness with pain during HS stretch, tight and painful piriformis bilaterally worse on the right , very weak core  FUNCTIONAL TESTS:  TUG 18 seconds no device  GAIT: No assistive device, slow mild antalgic on the right    TODAY'S TREATMENT:                                                                                                                              DATE:  10/14/23 Gait outside around the back building brisk pace some SOB Bike LEvel 5 x 6 minutes Seated row 20# Lats 20# Passive LE and trunk stretches STM with the T-gun to the bilateral L/S area Manual sheet traction lumbar  10/08/23 Bike level 5 x 6 minutes Feet on ball K2C, rotation, small bridge, isometric abs Passive stretch LE's STM to the left low back and buttock Manual sheet traction  10/06/23 Bike level 5 x 6 minutes Gait around the building no rest brisk pace Seated rows 20# LAts 20# HS curls 25# PAssive stretch LE's Manual sheet traction STM with the Tgun to the left low back and buttock  10/02/23 Gait outside around the building, down stairs 2x and up 1x no rest good pace Bike level 5 x 6 minutes 5# straight arm pulls  5# LEg extension 20# HS curls Feet on ball K2C, rotation, posterior activation, isometric abs Passive stretch HS STM to the left lumbar and buttock  09/29/23 Gait outside around the building 1.5 laps and then in the building and up and down the stairs Bike level 4 x 6 minutes 5# straight arm pulls with cues for posture and core 10# lats Feet on ball K2C, rotation, bridge, isometric abs STM with the Tgun to the bilateral low back area and into the HS Manual sheet traction gentle  09/25/23 Bike  level 5 x 6 minutes Gait outside brisk pace 5# straight arm pulls 20# leg press 10# lats Green tband clamshells Ball b/n knees squeeze STM with the Tgun Manual sheet traction   09/16/23 Gait around the building good pace Bike level 5 x 6 minutes Red tband Row Red tband extension Feet on ball K2C, rotation, bridge, gentle isometric abs Manual sheet traction STM to the low back and buttocks  09/04/23 Bike level 4 x 6 minutes LEg press 20# Stairs step over step Gait outside  Manual sheet traction STM to the right low back and hip Feet on ball k2c, rotation, bridge, PPT  09/01/23 Bike level 4 x 6 minutes Gait outside around the small parking [employer] step over step 2 full flights  Feet on ball K2C, rotation, small bridge, PPT's PAssive stretch LE's Manual sheet traction Discussion about lifting grandchild to protect breast and back   08/28/23 Bike level 4 x 8 minutes Leg curls 25# Leg extension 5# Feet on ball K2C, rotation, bridges, PPT Green tband clamshells Ball b/n knees small bridge STM to the buttock and  low back Manual sheet traction  08/25/23 Bike level 4 x 8 minutes Walk outside good pace, SOB and a limp on the left Feet on ball K2C, rotation, bridge, pelvic tilts needed a lot of verbal and tactile cues to get PPT Passive stretch LE's, calves are very tight calves, went over the stretch for home Manual sheet pelvic traction  08/21/23 Evaluation  PATIENT EDUCATION:  Education details: POC and HEP Person educated: Patient Education method: Explanation Education comprehension: verbalized understanding  HOME EXERCISE PROGRAM: Access Code: [facility] Access Code: [email or web address] URL: [email or web address] Date: 08/21/23 Prepared by: Ozell Mainland  Exercises - Hooklying Single Knee to Chest Stretch  - 2 x daily - 7 x weekly - 1 sets - 10 reps - 5 hold - Supine Double Knee to Chest  - 2 x daily - 7 x weekly - 1 sets - 10 reps - 5 hold -  Supine Lower Trunk Rotation with PLB  - 2 x daily - 7 x weekly - 1 sets - 10 reps - 5 hold - Supine Posterior Pelvic Tilt  - 2 x daily - 7 x weekly - 1 sets - 10 reps - 3 hold  ASSESSMENT:  CLINICAL IMPRESSION:   Patient is scheduled for left TKA on 11/10/23, She was away over the weekend and reports that her knee and back did well, she will be seeing the lumbar doctor in the near future and may see him prior to the knee surgery.  She did have a twinge of low back pain doing the lats.  She was short of breath with walking around the back building.  Patient has a right disc herniation at L2-3.   She had the right breast surgery June 26, has some limitations with lifting for this.  She is still having some significant low back pain.  Very tender in the right L2 area.  Fair strength.  Has left knee pain and has pain up to 8/10 with walking has a left TKA scheduled for 11/10/23.  She is going to have a grandchild due August 12. OBJECTIVE IMPAIRMENTS: Abnormal gait, cardiopulmonary status limiting activity, decreased activity tolerance, decreased endurance, decreased mobility, difficulty walking, decreased ROM, decreased strength, increased muscle spasms, impaired flexibility, improper body mechanics, postural dysfunction, and pain.   REHAB POTENTIAL: Good  CLINICAL DECISION MAKING: Stable/uncomplicated  EVALUATION COMPLEXITY: Low   GOALS: Goals reviewed with patient? Yes  SHORT TERM GOALS: Target date: 09/21/23  Patient will be independent with initial HEP. Goal status:  met 10/02/23   LONG TERM GOALS: Target date: 01/21/24  Patient will be independent with advanced/ongoing HEP to improve outcomes and carryover.  Goal status: initial  2.  Patient will report at least 75% improvement in R knee pain to improve QOL. And back pain Baseline: 8/10 Goal status:progressing 10/08/23  3.  Patient will increase lumbar ROM 25% Baseline:as noted above Initial.  Met 09/29/23  4.  Patient will  demonstrate improved functional LE strength as demonstrated by 5/5. Baseline: see above Goal status: progressing 10/08/23  5.  Patient will be able to ambulate 1000' without AD and normal gait pattern without increased pain to access community.  Baseline:  Goal status:met 09/16/23  6.  Decrease lumbar pain 50%  Goal status: met 10/14/23  7.  Report able to put shoes and socks on without pain > 4/10  Progressing 10/08/23  PLAN:  PT FREQUENCY: 2x/week  PT DURATION: 6 months  PLANNED INTERVENTIONS: Therapeutic exercises, Therapeutic activity, Neuromuscular re-education, Balance training, Gait training, Patient/Family  education, Self Care, Joint mobilization, Stair training, Electrical stimulation, Cryotherapy, Moist heat, scar mobilization, Vasopneumatic device, and Manual therapy  PLAN FOR NEXT SESSION:  able to do UE activities so initiated see how she is doing   Patient Details  Name: [name] MRN: [id number] Date of Birth: 11-24-1960 Referring Provider:  Colon Shove, MD  Encounter Date: 10/14/2023   OBADIAH OZELL ORN, PT 10/14/2023, 2:57 PM

## 2023-10-15 NOTE — H&P (Signed)
 TOTAL KNEE ADMISSION H&P  Patient is being admitted for left total knee arthroplasty.  Subjective:  Chief Complaint: Left knee pain.  HPI: [name], 63 y.o. female has a history of pain and functional disability in the left knee due to arthritis and has failed non-surgical conservative treatments for greater than 12 weeks to include corticosteriod injections, viscosupplementation injections, and weight reduction as appropriate. Onset of symptoms was gradual, starting several years ago with gradually worsening course since that time. The patient noted no past surgery on the left knee.  Patient currently rates pain in the left knee at 7 out of 10 with activity. Patient has worsening of pain with activity and weight bearing and pain that interferes with activities of daily living. Patient has evidence of periarticular osteophytes and joint space narrowing by imaging studies. There is no active infection.  Patient Active Problem List   Diagnosis Date Noted   OA (osteoarthritis) of knee 09/09/2022   Exertional dyspnea 04/17/2022   Elevated coronary artery calcium  score 04/17/2022   Spondylolisthesis at L4-L5 level 12/27/2021   Ductal carcinoma in situ (DCIS) of left breast 01/19/2018   Generalized anxiety disorder 11/07/2017   Depression 11/07/2017   Insomnia 11/07/2017   Cervical spondylosis 03/19/2013    Past Medical History:  Diagnosis Date   Arthritis    Back pain of lumbar region with sciatica    Cancer (HCC)    breast   Depression    Diabetes mellitus, type II (HCC)    Family history of anesthesia complication    grandfather died under anesthesia 70's- had heart issues   H/O carpal tunnel repair 2014   Headache(784.0)    Hyperlipidemia    Hypertension    Hypothyroidism    Medial meniscus tear    PONV (postoperative nausea and vomiting)    Sleep apnea    cpap since 10    Past Surgical History:  Procedure Laterality Date   BILATERAL TOTAL MASTECTOMY WITH AXILLARY  LYMPH NODE DISSECTION     BREAST RECONSTRUCTION     CERVICAL DISC ARTHROPLASTY N/A 03/19/2013   Procedure: CERVICAL FIVE TO SIX, CERVICAL SIX TO SEVEN CERVICAL ANTERIOR DISC ARTHROPLASTY;  Surgeon: Victory Gens, MD;  Location: [facility];  Service: Neurosurgery;  Laterality: N/A;  C56 C67 artificial disc replacement   CESAREAN SECTION     COLONOSCOPY     DILATATION & CURETTAGE/HYSTEROSCOPY WITH TRUECLEAR N/A 11/25/2013   Procedure: DILATATION & CURETTAGE/HYSTEROSCOPY WITH TRUCLEAR;  Surgeon: Charlie CHRISTELLA Croak, MD;  Location: [facility];  Service: Gynecology;  Laterality: N/A;   ECTOPIC PREGNANCY SURGERY     GALLBLADDER SURGERY     MOUTH SURGERY     child- dog bite   SPINAL FUSION     2023   STERIOD INJECTION Left 09/09/2022   Procedure: LEFT KNEE STEROID INJECTION;  Surgeon: Melodi Lerner, MD;  Location: [facility];  Service: Orthopedics;  Laterality: Left;  left knee injection 0928   TONSILLECTOMY     TOTAL KNEE ARTHROPLASTY Right 09/09/2022   Procedure: TOTAL KNEE ARTHROPLASTY;  Surgeon: Melodi Lerner, MD;  Location: [facility];  Service: Orthopedics;  Laterality: Right;   TUBAL LIGATION      Prior to Admission medications   Medication Sig Start Date End Date Taking? Authorizing Provider  ALPRAZolam  (XANAX ) 0.5 MG tablet Take 1/2-1 tablet three times daily as needed for anxiety or insomnia 03/15/23   Franchot Harlene SQUIBB, PMHNP  atorvastatin  (LIPITOR) 20 MG tablet TAKE 1 TABLET BY MOUTH EVERY DAY 09/04/23  Patwardhan, Manish J, MD  doxazosin  (CARDURA ) 4 MG tablet Take 1 tablet (4 mg total) by mouth at bedtime. 01/06/23   Franchot Harlene SQUIBB, PMHNP  famotidine -calcium  carbonate-magnesium  hydroxide (PEPCID  COMPLETE) 10-800-165 MG chewable tablet Chew 1 tablet by mouth daily as needed (with aleve for pain.).    [provider]  gabapentin  (NEURONTIN ) 100 MG capsule Take 1 capsule (100 mg total) by mouth 2 (two) times daily as needed (anxiety). 01/23/23   Franchot Harlene SQUIBB, PMHNP  gabapentin   (NEURONTIN ) 300 MG capsule Take 1 capsule (300 mg total) by mouth at bedtime. 03/15/23   Franchot Harlene SQUIBB, PMHNP  Galcanezumab-gnlm 120 MG/ML SOAJ Inject 120 mg into the skin every 30 (thirty) days.    [provider]  GOODSENSE PSYLLIUM FIBER PO Take 4 capsules by mouth every Monday, Tuesday, Wednesday, Thursday, and Friday.    [provider]  hydrochlorothiazide  (MICROZIDE ) 12.5 MG capsule Take 12.5 mg by mouth in the morning.    [provider]  JARDIANCE  10 MG TABS tablet Take 10 mg by mouth in the morning. 11/13/21   [provider]  Lemborexant  (DAYVIGO ) 10 MG TABS Take 1 tablet (10 mg total) by mouth at bedtime. 03/31/23   Franchot Harlene SQUIBB, PMHNP  metFORMIN  (GLUCOPHAGE ) 500 MG tablet Take 500 mg by mouth daily with supper.    [provider]  methocarbamol  (ROBAXIN ) 500 MG tablet Take 1 tablet (500 mg total) by mouth every 6 (six) hours as needed for muscle spasms. 09/10/22   Kodie Kishi Zeman, PA-C  metoprolol  succinate (TOPROL -XL) 100 MG 24 hr tablet Take 100 mg by mouth at bedtime.    [provider]  mometasone (ELOCON) 0.1 % lotion 5 drops daily. Instill 5 drops into right ear daily 08/13/22   [provider]  Naphazoline-Pheniramine (OPCON-A ) 0.027-0.315 % SOLN Place 1-2 drops into both eyes 3 (three) times daily as needed (irritated/allergy eyes). Patient not taking: Reported on 10/24/2022    [provider]  OnabotulinumtoxinA (BOTOX IM) Inject 1 Dose into the muscle every 3 (three) months. FOR MIGRAINES    [provider]  ondansetron  (ZOFRAN ) 4 MG tablet Take 1 tablet (4 mg total) by mouth every 6 (six) hours as needed for nausea. 09/10/22   Jennessy Sandridge Zeman, PA-C  oxyCODONE  (OXY IR/ROXICODONE ) 5 MG immediate release tablet Take 1-2 tablets (5-10 mg total) by mouth every 6 (six) hours as needed for severe pain. Patient not taking: Reported on 01/06/2023 09/10/22   Kashawn Dirr Zeman, PA-C   rizatriptan  (MAXALT -MLT) 10 MG disintegrating tablet Take 10 mg by mouth as needed for migraine.     [provider]  Tetrahydrozoline HCl (VISINE OP) Place 1 drop into both eyes daily as needed (irritation).    [provider]  thyroid  (ARMOUR) 30 MG tablet Take 30 mg by mouth daily before breakfast.     [provider]  traMADol  (ULTRAM ) 50 MG tablet Take 1-2 tablets (50-100 mg total) by mouth every 6 (six) hours as needed for moderate pain. 09/10/22   Taos Tapp Zeman, PA-C  UBRELVY  50 MG TABS Take 50 mg by mouth daily as needed (migraine). 10/31/20   [provider]  Vilazodone  HCl 20 MG TABS Take 1.5 tablets (30 mg total) by mouth daily with breakfast. 01/06/23 04/06/23  Franchot Harlene SQUIBB, PMHNP    Allergies  Allergen Reactions   Effexor [Venlafaxine Hcl]     Weight loss and cant sleep   Effexor [Venlafaxine] Other (See Comments)  Other Reaction(s): anxiety/ extreme weight loss   Linzess [Linaclotide] Diarrhea    Social History   Socioeconomic History   Marital status: Married    Spouse name: Signe   Number of children: 3   Years of education: college   Highest education level: Not on file  Occupational History   Occupation: N/A  Tobacco Use   Smoking status: Never   Smokeless tobacco: Never   Tobacco comments:    occ alcohol   Vaping Use   Vaping status: Never Used  Substance and Sexual Activity   Alcohol  use: Yes    Alcohol /week: 1.0 standard drink of alcohol     Types: 1 Cans of beer per week    Comment: occ   Drug use: No   Sexual activity: Not Currently  Other Topics Concern   Not on file  Social History Narrative   Denies caffeine use    Social Drivers of Corporate investment banker Strain: Not on file  Food Insecurity: No Food Insecurity (09/09/2022)   Hunger Vital Sign    Worried About Running Out of Food in the Last Year: Never true    Ran Out of Food in the Last Year: Never true  Transportation Needs: No  Transportation Needs (09/09/2022)   PRAPARE - Administrator, Civil Service (Medical): No    Lack of Transportation (Non-Medical): No  Physical Activity: Not on file  Stress: Not on file  Social Connections: Unknown (06/04/2021)   [facility]   Social Network    Social Network: Not on file  Intimate Partner Violence: Not At Risk (09/09/2022)   Humiliation, Afraid, Rape, and Kick questionnaire    Fear of Current or Ex-Partner: No    Emotionally Abused: No    Physically Abused: No    Sexually Abused: No    Tobacco Use: Low Risk  (10/14/2023)   Patient History    Smoking Tobacco Use: Never    Smokeless Tobacco Use: Never    Passive Exposure: Not on file   Social History   Substance and Sexual Activity  Alcohol  Use Yes   Alcohol /week: 1.0 standard drink of alcohol    Types: 1 Cans of beer per week   Comment: occ    Family History  Problem Relation Age of Onset   Hyperlipidemia Mother    Heart disease Father    Hypertension Father    Diabetes Father    Cancer Maternal Aunt        Breast - In 93's   Cancer Paternal Aunt        Breast - in 28's   Cancer Maternal Grandmother        breast cancer   Diabetes Paternal Grandmother    Heart disease Paternal Grandfather    Hypertension Paternal Grandfather    Suicidality Other    Sleep apnea Neg Hx     ROS  Objective:  Physical Exam: - Well-developed female alert and oriented in no apparent distress    - Right knee: No warmth or swelling. Range of motion 0 to 130 degrees. No tenderness or instability.    - Left knee: No effusion. Range of motion 0 to 130 degrees. Marked crepitus on range of motion. Tender medial greater than lateral. No instability.    IMAGING:    X-rays of both knees demonstrate the prosthesis on the right knee in excellent position with no periprosthetic abnormalities. The left knee shows bone-on-bone medial patellofemoral arthritis.  Assessment/Plan:  End stage  arthritis,  left knee   The patient history, physical examination, clinical judgment of the provider and imaging studies are consistent with end stage degenerative joint disease of the left knee and total knee arthroplasty is deemed medically necessary. The treatment options including medical management, injection therapy arthroscopy and arthroplasty were discussed at length. The risks and benefits of total knee arthroplasty were presented and reviewed. The risks due to aseptic loosening, infection, stiffness, patella tracking problems, thromboembolic complications and other imponderables were discussed. The patient acknowledged the explanation, agreed to proceed with the plan and consent was signed. Patient is being admitted for inpatient treatment for surgery, pain control, PT, OT, prophylactic antibiotics, VTE prophylaxis, progressive ambulation and ADLs and discharge planning. The patient is planning to be discharged home.   Patient's anticipated LOS is less than 2 midnights, meeting these requirements: - Younger than 71 - Lives within 1 hour of care - Has a competent adult at home to recover with post-op recover - NO history of  - Chronic pain requiring opiods  - Diabetes  - Coronary Artery Disease  - Heart failure  - Heart attack  - Stroke  - DVT/VTE  - Cardiac arrhythmia  - Respiratory Failure/COPD  - Renal failure  - Anemia  - Advanced Liver disease  Therapy Plans: [facility] Disposition: Home with husband & children Planned DVT Prophylaxis: Xarelto  10mg  QD DME Needed: None PCP: Norleen Jungling MD (clearance received) TXA: IV Allergies: Effexor (confusion, insomnia) Anesthesia Concerns: N/V, hx lumbar fusion in Dec 2023 BMI: 37.3 Last HgbA1c: Recheck at PAT Pain Regimen: Oxycodone  Pharmacy: [facility] on [address]  Other: -Stop Ozempic two weeks before surgery -No BP or IV sticks left arm -Took Emgality uninterrupted with last TKA  - Patient was instructed on  what medications to stop prior to surgery. - Follow-up visit in 2 weeks with Dr. Melodi - Begin physical therapy following surgery - Pre-operative lab work as pre-surgical testing - Prescriptions will be provided in hospital at time of discharge  Corean Sender, PA-C Orthopedic Surgery [facility] [place]

## 2023-10-16 ENCOUNTER — Ambulatory Visit: Admitting: Physical Therapy

## 2023-10-16 ENCOUNTER — Encounter: Payer: Self-pay | Admitting: Physical Therapy

## 2023-10-16 DIAGNOSIS — R2689 Other abnormalities of gait and mobility: Secondary | ICD-10-CM

## 2023-10-16 DIAGNOSIS — M6281 Muscle weakness (generalized): Secondary | ICD-10-CM

## 2023-10-16 DIAGNOSIS — G8929 Other chronic pain: Secondary | ICD-10-CM

## 2023-10-16 DIAGNOSIS — Z96651 Presence of right artificial knee joint: Secondary | ICD-10-CM

## 2023-10-16 DIAGNOSIS — R600 Localized edema: Secondary | ICD-10-CM

## 2023-10-16 DIAGNOSIS — M549 Dorsalgia, unspecified: Secondary | ICD-10-CM | POA: Diagnosis not present

## 2023-10-16 DIAGNOSIS — M25661 Stiffness of right knee, not elsewhere classified: Secondary | ICD-10-CM

## 2023-10-16 DIAGNOSIS — M25561 Pain in right knee: Secondary | ICD-10-CM

## 2023-10-16 NOTE — Therapy (Signed)
 OUTPATIENT PHYSICAL THERAPY LOWER EXTREMITY TREATMENT    Patient Name: [name] MRN: [id number] DOB:Sep 25, 1960, 63 y.o., female Today's Date: 10/16/2023  END OF SESSION:  PT End of Session - 10/16/23 1538     Visit Number 13    Date for Recertification  11/21/23    Authorization Type [employer]    PT Start Time 1530    PT Stop Time 1615    PT Time Calculation (min) 45 min    Activity Tolerance Patient tolerated treatment well    Behavior During Therapy WFL for tasks assessed/performed            Past Medical History:  Diagnosis Date   Arthritis    Back pain of lumbar region with sciatica    Cancer (HCC)    breast   Depression    Diabetes mellitus, type II (HCC)    Family history of anesthesia complication    grandfather died under anesthesia 70's- had heart issues   H/O carpal tunnel repair 2014   Headache(784.0)    Hyperlipidemia    Hypertension    Hypothyroidism    Medial meniscus tear    PONV (postoperative nausea and vomiting)    Sleep apnea    cpap since 10   Past Surgical History:  Procedure Laterality Date   BILATERAL TOTAL MASTECTOMY WITH AXILLARY LYMPH NODE DISSECTION     BREAST RECONSTRUCTION     CERVICAL DISC ARTHROPLASTY N/A 03/19/2013   Procedure: CERVICAL FIVE TO SIX, CERVICAL SIX TO SEVEN CERVICAL ANTERIOR DISC ARTHROPLASTY;  Surgeon: Victory Gens, MD;  Location: [facility];  Service: Neurosurgery;  Laterality: N/A;  C56 C67 artificial disc replacement   CESAREAN SECTION     COLONOSCOPY     DILATATION & CURETTAGE/HYSTEROSCOPY WITH TRUECLEAR N/A 11/25/2013   Procedure: DILATATION & CURETTAGE/HYSTEROSCOPY WITH TRUCLEAR;  Surgeon: Charlie CHRISTELLA Croak, MD;  Location: [facility];  Service: Gynecology;  Laterality: N/A;   ECTOPIC PREGNANCY SURGERY     GALLBLADDER SURGERY     MOUTH SURGERY     child- dog bite   SPINAL FUSION     2023   STERIOD INJECTION Left 09/09/2022   Procedure: LEFT KNEE STEROID INJECTION;  Surgeon: Melodi Lerner, MD;  Location:  [facility];  Service: Orthopedics;  Laterality: Left;  left knee injection 0928   TONSILLECTOMY     TOTAL KNEE ARTHROPLASTY Right 09/09/2022   Procedure: TOTAL KNEE ARTHROPLASTY;  Surgeon: Melodi Lerner, MD;  Location: [facility];  Service: Orthopedics;  Laterality: Right;   TUBAL LIGATION     Patient Active Problem List   Diagnosis Date Noted   OA (osteoarthritis) of knee 09/09/2022   Exertional dyspnea 04/17/2022   Elevated coronary artery calcium  score 04/17/2022   Spondylolisthesis at L4-L5 level 12/27/2021   Ductal carcinoma in situ (DCIS) of left breast 01/19/2018   Generalized anxiety disorder 11/07/2017   Depression 11/07/2017   Insomnia 11/07/2017   Cervical spondylosis 03/19/2013    PCP: Norleen Jungling  REFERRING PROVIDER: Lerner Lawn  REFERRING DIAG: R TKA 09/09/22  THERAPY DIAG:  Chronic bilateral back pain, unspecified back location  S/P total knee arthroplasty, right  Stiffness of right knee, not elsewhere classified  Muscle weakness (generalized)  Acute pain of right knee  Localized edema  Other abnormalities of gait and mobility  Rationale for Evaluation and Treatment: Rehabilitation  ONSET DATE: 09/09/22  SUBJECTIVE:   SUBJECTIVE STATEMENT :  Patient reports that she is feeling better today  Patient had right breast revision on 07/17/23  due to scar encapsulation.  She is returning here due to the back pain.  Reports that she is doing a little better due to being less active with this surgery  Saw Dr. Colon last Friday, has a bulging disc on the right at L2, recommended continuation of PT.  Will have injection in back May 20th R TKA 8/19 L knee steroid injection 09/09/22  PAIN:  Are you having pain? Yes: NPRS scale: 6/10 Pain location: right low back, buttock and reports around to the front of the thigh Pain description: sharp really hurts, ache Aggravating factors: difficulty sleeping, difficulty walking and standing and sitting pain up to 9/10 trying to  clean the house recently Relieving factors: heat, Aleve , tramadol  and methacarbomol, the PT treatment was helping  PRECAUTIONS: None no baths, no pushing up, no weights with arms  RED FLAGS: None   WEIGHT BEARING RESTRICTIONS: No  FALLS:  Has patient fallen in last 6 months? No  LIVING ENVIRONMENT: Lives with: lives with their family and lives with their spouse Lives in: House/apartment Stairs: Yes: External: 3 steps; none Has following equipment at home: Single point cane and Walker - 2 wheeled  OCCUPATION: Not working  PLOF: Independent  PATIENT GOALS: less back pain  NEXT MD VISIT: none for Dr. Colon  OBJECTIVE:   PATIENT SURVEYS:    COGNITION: Overall cognitive status: Within functional limits for tasks assessed      EDEMA:    POSTURE: rounded shoulders and flexed trunk   PALPATION: Very tender in the right lumbar L2-5, some tenderness in the buttock  LOWER EXTREMITY ROM:  Active ROM Right eval   Right 09/26/22 Right 10/02/22  11/01/22 12/09/22  Hip flexion          Hip extension          Hip abduction          Hip adduction          Hip internal rotation          Hip external rotation          Knee flexion 110    82* AROM seated per pt request  78* self AAROM, 76* AROM  PROM 87 AROM 81 PROM 90 AROM 83 AROM 101 PROM 105  Knee extension 0    -16* supine heel prop with quad set  AROM 25 PROM 16 AROM 14 AROM 5 PROM 0  Ankle dorsiflexion          Ankle plantarflexion          Ankle inversion          Ankle eversion           (Blank rows = not tested)  LOWER EXTREMITY MMT:  MMT Right eval Left eval  Hip flexion 3+   Hip extension    Hip abduction    Hip adduction    Hip internal rotation    Hip external rotation    Knee flexion 4+   Knee extension 4+   Ankle dorsiflexion    Ankle plantarflexion    Ankle inversion    Ankle eversion     (Blank rows = not tested)  LUMBAR ROM:  decreased 25% flexion, decreased 75% for extension and side  bending, pain was worse with extension and right side bending. Hooklying is less painful that lying flat on back Mild tightness with pain during HS stretch, tight and painful piriformis bilaterally worse on the right , very weak core  FUNCTIONAL TESTS:  TUG 18 seconds no device  GAIT: No assistive device, slow mild antalgic on the right    TODAY'S TREATMENT:                                                                                                                              DATE:  10/16/23 Bike level 5 x 6 minutes Gait outside also back building 2 flights of stairs up and down and then up again Leg curls 25# 2x10 5# leg extension 20# lat pulls Feet on ball K2C, rotation, bridge, isometric abs Reviewed MD orders for TKA 11/10/23, reviewed the HEP they gave her  will need to go over this again  10/14/23 Gait outside around the back building brisk pace some SOB Bike LEvel 5 x 6 minutes Seated row 20# Lats 20# Passive LE and trunk stretches STM with the T-gun to the bilateral L/S area Manual sheet traction lumbar  10/08/23 Bike level 5 x 6 minutes Feet on ball K2C, rotation, small bridge, isometric abs Passive stretch LE's STM to the left low back and buttock Manual sheet traction  10/06/23 Bike level 5 x 6 minutes Gait around the building no rest brisk pace Seated rows 20# LAts 20# HS curls 25# PAssive stretch LE's Manual sheet traction STM with the Tgun to the left low back and buttock  10/02/23 Gait outside around the building, down stairs 2x and up 1x no rest good pace Bike level 5 x 6 minutes 5# straight arm pulls  5# LEg extension 20# HS curls Feet on ball K2C, rotation, posterior activation, isometric abs Passive stretch HS STM to the left lumbar and buttock  09/29/23 Gait outside around the building 1.5 laps and then in the building and up and down the stairs Bike level 4 x 6 minutes 5# straight arm pulls with cues for posture and core 10# lats Feet  on ball K2C, rotation, bridge, isometric abs STM with the Tgun to the bilateral low back area and into the HS Manual sheet traction gentle  09/25/23 Bike level 5 x 6 minutes Gait outside brisk pace 5# straight arm pulls 20# leg press 10# lats Green tband clamshells Ball b/n knees squeeze STM with the Tgun Manual sheet traction   09/16/23 Gait around the building good pace Bike level 5 x 6 minutes Red tband Row Red tband extension Feet on ball K2C, rotation, bridge, gentle isometric abs Manual sheet traction STM to the low back and buttocks  09/04/23 Bike level 4 x 6 minutes LEg press 20# Stairs step over step Gait outside  Manual sheet traction STM to the right low back and hip Feet on ball k2c, rotation, bridge, PPT  09/01/23 Bike level 4 x 6 minutes Gait outside around the small parking [employer] step over step 2 full flights  Feet on ball K2C, rotation, small bridge, PPT's PAssive stretch LE's Manual sheet traction Discussion about lifting grandchild to protect breast and back   08/28/23  Bike level 4 x 8 minutes Leg curls 25# Leg extension 5# Feet on ball K2C, rotation, bridges, PPT Green tband clamshells Ball b/n knees small bridge STM to the buttock and low back Manual sheet traction  08/25/23 Bike level 4 x 8 minutes Walk outside good pace, SOB and a limp on the left Feet on ball K2C, rotation, bridge, pelvic tilts needed a lot of verbal and tactile cues to get PPT Passive stretch LE's, calves are very tight calves, went over the stretch for home Manual sheet pelvic traction  08/21/23 Evaluation  PATIENT EDUCATION:  Education details: POC and HEP Person educated: Patient Education method: Explanation Education comprehension: verbalized understanding  HOME EXERCISE PROGRAM: Access Code: [facility] Access Code: [email or web address] URL: [email or web address] Date: 08/21/23 Prepared by: Ozell Mainland  Exercises - Hooklying Single Knee to  Chest Stretch  - 2 x daily - 7 x weekly - 1 sets - 10 reps - 5 hold - Supine Double Knee to Chest  - 2 x daily - 7 x weekly - 1 sets - 10 reps - 5 hold - Supine Lower Trunk Rotation with PLB  - 2 x daily - 7 x weekly - 1 sets - 10 reps - 5 hold - Supine Posterior Pelvic Tilt  - 2 x daily - 7 x weekly - 1 sets - 10 reps - 3 hold  ASSESSMENT:  CLINICAL IMPRESSION:   Patient is scheduled for left TKA on 11/10/23, She has the new order and the pre op exercises and instructions, she has questions and we will go over with her next week  Patient has a right disc herniation at L2-3.   She had the right breast surgery June 26, has some limitations with lifting for this.  She is still having some significant low back pain.  Very tender in the right L2 area.  Fair strength.  Has left knee pain and has pain up to 8/10 with walking has a left TKA scheduled for 11/10/23.  She is going to have a grandchild due August 12. OBJECTIVE IMPAIRMENTS: Abnormal gait, cardiopulmonary status limiting activity, decreased activity tolerance, decreased endurance, decreased mobility, difficulty walking, decreased ROM, decreased strength, increased muscle spasms, impaired flexibility, improper body mechanics, postural dysfunction, and pain.   REHAB POTENTIAL: Good  CLINICAL DECISION MAKING: Stable/uncomplicated  EVALUATION COMPLEXITY: Low   GOALS: Goals reviewed with patient? Yes  SHORT TERM GOALS: Target date: 09/21/23  Patient will be independent with initial HEP. Goal status:  met 10/02/23   LONG TERM GOALS: Target date: 01/21/24  Patient will be independent with advanced/ongoing HEP to improve outcomes and carryover.  Goal status: initial  2.  Patient will report at least 75% improvement in R knee pain to improve QOL. And back pain Baseline: 8/10 Goal status:progressing 10/16/23  3.  Patient will increase lumbar ROM 25% Baseline:as noted above Initial.  Met 09/29/23  4.  Patient will demonstrate improved  functional LE strength as demonstrated by 5/5. Baseline: see above Goal status: progressing 10/08/23  5.  Patient will be able to ambulate 1000' without AD and normal gait pattern without increased pain to access community.  Baseline:  Goal status:met 09/16/23  6.  Decrease lumbar pain 50%  Goal status: met 10/14/23  7.  Report able to put shoes and socks on without pain > 4/10  Progressing 10/08/23  PLAN:  PT FREQUENCY: 2x/week  PT DURATION: 6 months  PLANNED INTERVENTIONS: Therapeutic exercises, Therapeutic activity, Neuromuscular re-education, Balance training, Gait training, Patient/Family education, Self Care, Joint  mobilization, Stair training, Electrical stimulation, Cryotherapy, Moist heat, scar mobilization, Vasopneumatic device, and Manual therapy  PLAN FOR NEXT SESSION:  able to do UE activities so initiated see how she is doing   Patient Details  Name: LILYAN PRETE MRN: [id number] Date of Birth: 06-03-1960 Referring Provider:  Colon Shove, MD  Encounter Date: 10/16/2023   OBADIAH OZELL ORN, PT 10/16/2023, 3:39 PM

## 2023-10-20 ENCOUNTER — Ambulatory Visit: Admitting: Physical Therapy

## 2023-10-20 ENCOUNTER — Encounter: Payer: Self-pay | Admitting: Physical Therapy

## 2023-10-20 DIAGNOSIS — R2689 Other abnormalities of gait and mobility: Secondary | ICD-10-CM

## 2023-10-20 DIAGNOSIS — R600 Localized edema: Secondary | ICD-10-CM

## 2023-10-20 DIAGNOSIS — M6281 Muscle weakness (generalized): Secondary | ICD-10-CM

## 2023-10-20 DIAGNOSIS — M25561 Pain in right knee: Secondary | ICD-10-CM

## 2023-10-20 DIAGNOSIS — M25661 Stiffness of right knee, not elsewhere classified: Secondary | ICD-10-CM

## 2023-10-20 DIAGNOSIS — M549 Dorsalgia, unspecified: Secondary | ICD-10-CM | POA: Diagnosis not present

## 2023-10-20 DIAGNOSIS — G8929 Other chronic pain: Secondary | ICD-10-CM

## 2023-10-20 DIAGNOSIS — Z96651 Presence of right artificial knee joint: Secondary | ICD-10-CM

## 2023-10-20 NOTE — Therapy (Signed)
 OUTPATIENT PHYSICAL THERAPY LOWER EXTREMITY TREATMENT    Patient Name: [name] MRN: [id number] DOB:[birth year], 63 y.o., female Today's Date: 10/20/2023  END OF SESSION:  PT End of Session - 10/20/23 1538     Visit Number 14    Date for Recertification  11/21/23    Authorization Type [employer]    PT Start Time 1530    PT Stop Time 1615    PT Time Calculation (min) 45 min    Activity Tolerance Patient tolerated treatment well    Behavior During Therapy WFL for tasks assessed/performed            Past Medical History:  Diagnosis Date   Arthritis    Back pain of lumbar region with sciatica    Cancer (HCC)    breast   Depression    Diabetes mellitus, type II (HCC)    Family history of anesthesia complication    grandfather died under anesthesia 70's- had heart issues   H/O carpal tunnel repair 2014   Headache(784.0)    Hyperlipidemia    Hypertension    Hypothyroidism    Medial meniscus tear    PONV (postoperative nausea and vomiting)    Sleep apnea    cpap since 10   Past Surgical History:  Procedure Laterality Date   BILATERAL TOTAL MASTECTOMY WITH AXILLARY LYMPH NODE DISSECTION     BREAST RECONSTRUCTION     CERVICAL DISC ARTHROPLASTY N/A 03/19/2013   Procedure: CERVICAL FIVE TO SIX, CERVICAL SIX TO SEVEN CERVICAL ANTERIOR DISC ARTHROPLASTY;  Surgeon: Victory Gens, MD;  Location: [facility];  Service: Neurosurgery;  Laterality: N/A;  C56 C67 artificial disc replacement   CESAREAN SECTION     COLONOSCOPY     DILATATION & CURETTAGE/HYSTEROSCOPY WITH TRUECLEAR N/A 11/25/2013   Procedure: DILATATION & CURETTAGE/HYSTEROSCOPY WITH TRUCLEAR;  Surgeon: Charlie CHRISTELLA Croak, MD;  Location: [facility];  Service: Gynecology;  Laterality: N/A;   ECTOPIC PREGNANCY SURGERY     GALLBLADDER SURGERY     MOUTH SURGERY     child- dog bite   SPINAL FUSION     2023   STERIOD INJECTION Left 09/09/2022   Procedure: LEFT KNEE STEROID INJECTION;  Surgeon: Melodi Lerner, MD;  Location:  [facility];  Service: Orthopedics;  Laterality: Left;  left knee injection 0928   TONSILLECTOMY     TOTAL KNEE ARTHROPLASTY Right 09/09/2022   Procedure: TOTAL KNEE ARTHROPLASTY;  Surgeon: Melodi Lerner, MD;  Location: [facility];  Service: Orthopedics;  Laterality: Right;   TUBAL LIGATION     Patient Active Problem List   Diagnosis Date Noted   OA (osteoarthritis) of knee 09/09/2022   Exertional dyspnea 04/17/2022   Elevated coronary artery calcium  score 04/17/2022   Spondylolisthesis at L4-L5 level 12/27/2021   Ductal carcinoma in situ (DCIS) of left breast 01/19/2018   Generalized anxiety disorder 11/07/2017   Depression 11/07/2017   Insomnia 11/07/2017   Cervical spondylosis 03/19/2013    PCP: Norleen Jungling  REFERRING PROVIDER: Lerner Lawn  REFERRING DIAG: R TKA 09/09/22  THERAPY DIAG:  Chronic bilateral back pain, unspecified back location  S/P total knee arthroplasty, right  Stiffness of right knee, not elsewhere classified  Muscle weakness (generalized)  Acute pain of right knee  Localized edema  Other abnormalities of gait and mobility  Rationale for Evaluation and Treatment: Rehabilitation  ONSET DATE: 09/09/22  SUBJECTIVE:   SUBJECTIVE STATEMENT :  Doing okay  Patient had right breast revision on 07/17/23 due to scar encapsulation.  She  is returning here due to the back pain.  Reports that she is doing a little better due to being less active with this surgery  Saw Dr. Colon last Friday, has a bulging disc on the right at L2, recommended continuation of PT.  Will have injection in back May 20th R TKA 8/19 L knee steroid injection 09/09/22  PAIN:  Are you having pain? Yes: NPRS scale: 6/10 Pain location: right low back, buttock and reports around to the front of the thigh Pain description: sharp really hurts, ache Aggravating factors: difficulty sleeping, difficulty walking and standing and sitting pain up to 9/10 trying to clean the house recently Relieving  factors: heat, Aleve , tramadol  and methacarbomol, the PT treatment was helping  PRECAUTIONS: None no baths, no pushing up, no weights with arms  RED FLAGS: None   WEIGHT BEARING RESTRICTIONS: No  FALLS:  Has patient fallen in last 6 months? No  LIVING ENVIRONMENT: Lives with: lives with their family and lives with their spouse Lives in: House/apartment Stairs: Yes: External: 3 steps; none Has following equipment at home: Single point cane and Walker - 2 wheeled  OCCUPATION: Not working  PLOF: Independent  PATIENT GOALS: less back pain  NEXT MD VISIT: none for Dr. Colon  OBJECTIVE:   PATIENT SURVEYS:    COGNITION: Overall cognitive status: Within functional limits for tasks assessed      EDEMA:    POSTURE: rounded shoulders and flexed trunk   PALPATION: Very tender in the right lumbar L2-5, some tenderness in the buttock  LOWER EXTREMITY ROM:  Active ROM Right eval   Right 09/26/22 Right 10/02/22  11/01/22 12/09/22  Hip flexion          Hip extension          Hip abduction          Hip adduction          Hip internal rotation          Hip external rotation          Knee flexion 110    82* AROM seated per pt request  78* self AAROM, 76* AROM  PROM 87 AROM 81 PROM 90 AROM 83 AROM 101 PROM 105  Knee extension 0    -16* supine heel prop with quad set  AROM 25 PROM 16 AROM 14 AROM 5 PROM 0  Ankle dorsiflexion          Ankle plantarflexion          Ankle inversion          Ankle eversion           (Blank rows = not tested)  LOWER EXTREMITY MMT:  MMT Right eval Left eval  Hip flexion 3+   Hip extension    Hip abduction    Hip adduction    Hip internal rotation    Hip external rotation    Knee flexion 4+   Knee extension 4+   Ankle dorsiflexion    Ankle plantarflexion    Ankle inversion    Ankle eversion     (Blank rows = not tested)  LUMBAR ROM:  decreased 25% flexion, decreased 75% for extension and side bending, pain was worse with  extension and right side bending. Hooklying is less painful that lying flat on back Mild tightness with pain during HS stretch, tight and painful piriformis bilaterally worse on the right , very weak core  FUNCTIONAL TESTS:  TUG 18 seconds no device  GAIT: No assistive device, slow mild antalgic on the right    TODAY'S TREATMENT:                                                                                                                              DATE:  10/20/23 Bike level 4 x 6 minutes Tmill pushes Reviewed and PT performed and demo of all the HEP from the pre surgery visit she had last week, went over the RICE and walking every hour, cues for quad activation and to decrease stress on back Manual pelvic sheet traction Passive LE stretches  10/16/23 Bike level 5 x 6 minutes Gait outside also back building 2 flights of stairs up and down and then up again Leg curls 25# 2x10 5# leg extension 20# lat pulls Feet on ball K2C, rotation, bridge, isometric abs Reviewed MD orders for TKA 11/10/23, reviewed the HEP they gave her  will need to go over this again  10/14/23 Gait outside around the back building brisk pace some SOB Bike LEvel 5 x 6 minutes Seated row 20# Lats 20# Passive LE and trunk stretches STM with the T-gun to the bilateral L/S area Manual sheet traction lumbar  10/08/23 Bike level 5 x 6 minutes Feet on ball K2C, rotation, small bridge, isometric abs Passive stretch LE's STM to the left low back and buttock Manual sheet traction  10/06/23 Bike level 5 x 6 minutes Gait around the building no rest brisk pace Seated rows 20# LAts 20# HS curls 25# PAssive stretch LE's Manual sheet traction STM with the Tgun to the left low back and buttock  10/02/23 Gait outside around the building, down stairs 2x and up 1x no rest good pace Bike level 5 x 6 minutes 5# straight arm pulls  5# LEg extension 20# HS curls Feet on ball K2C, rotation, posterior activation,  isometric abs Passive stretch HS STM to the left lumbar and buttock  09/29/23 Gait outside around the building 1.5 laps and then in the building and up and down the stairs Bike level 4 x 6 minutes 5# straight arm pulls with cues for posture and core 10# lats Feet on ball K2C, rotation, bridge, isometric abs STM with the Tgun to the bilateral low back area and into the HS Manual sheet traction gentle  09/25/23 Bike level 5 x 6 minutes Gait outside brisk pace 5# straight arm pulls 20# leg press 10# lats Green tband clamshells Ball b/n knees squeeze STM with the Tgun Manual sheet traction   09/16/23 Gait around the building good pace Bike level 5 x 6 minutes Red tband Row Red tband extension Feet on ball K2C, rotation, bridge, gentle isometric abs Manual sheet traction STM to the low back and buttocks  09/04/23 Bike level 4 x 6 minutes LEg press 20# Stairs step over step Gait outside  Manual sheet traction STM to the right low back and hip Feet on ball k2c, rotation, bridge, PPT  09/01/23 Bike level 4 x 6 minutes Gait outside around the small parking [employer] step over step 2 full flights  Feet on ball K2C, rotation, small bridge, PPT's PAssive stretch LE's Manual sheet traction Discussion about lifting grandchild to protect breast and back   08/28/23 Bike level 4 x 8 minutes Leg curls 25# Leg extension 5# Feet on ball K2C, rotation, bridges, PPT Green tband clamshells Ball b/n knees small bridge STM to the buttock and low back Manual sheet traction  08/25/23 Bike level 4 x 8 minutes Walk outside good pace, SOB and a limp on the left Feet on ball K2C, rotation, bridge, pelvic tilts needed a lot of verbal and tactile cues to get PPT Passive stretch LE's, calves are very tight calves, went over the stretch for home Manual sheet pelvic traction  08/21/23 Evaluation  PATIENT EDUCATION:  Education details: POC and HEP Person educated: Patient Education  method: Explanation Education comprehension: verbalized understanding  HOME EXERCISE PROGRAM: Access Code: [facility] Access Code: [email or web address] URL: [email or web address] Date: 08/21/23 Prepared by: Ozell Mainland  Exercises - Hooklying Single Knee to Chest Stretch  - 2 x daily - 7 x weekly - 1 sets - 10 reps - 5 hold - Supine Double Knee to Chest  - 2 x daily - 7 x weekly - 1 sets - 10 reps - 5 hold - Supine Lower Trunk Rotation with PLB  - 2 x daily - 7 x weekly - 1 sets - 10 reps - 5 hold - Supine Posterior Pelvic Tilt  - 2 x daily - 7 x weekly - 1 sets - 10 reps - 3 hold  ASSESSMENT:  CLINICAL IMPRESSION:   Patient is scheduled for left TKA on 11/10/23, She has the new order and the pre op exercises and instructions, started to go over the pre and post op instructions with her, PT demo and cues.  Patient has a right disc herniation at L2-3.   She had the right breast surgery June 26, has some limitations with lifting for this.  She is still having some significant low back pain.  Very tender in the right L2 area.  Fair strength.  Has left knee pain and has pain up to 8/10 with walking has a left TKA scheduled for 11/10/23.  She is going to have a grandchild due August 12. OBJECTIVE IMPAIRMENTS: Abnormal gait, cardiopulmonary status limiting activity, decreased activity tolerance, decreased endurance, decreased mobility, difficulty walking, decreased ROM, decreased strength, increased muscle spasms, impaired flexibility, improper body mechanics, postural dysfunction, and pain.   REHAB POTENTIAL: Good  CLINICAL DECISION MAKING: Stable/uncomplicated  EVALUATION COMPLEXITY: Low   GOALS: Goals reviewed with patient? Yes  SHORT TERM GOALS: Target date: 09/21/23  Patient will be independent with initial HEP. Goal status:  met 10/02/23   LONG TERM GOALS: Target date: 01/21/24  Patient will be independent with advanced/ongoing HEP to improve outcomes and carryover.  Goal  status: initial  2.  Patient will report at least 75% improvement in R knee pain to improve QOL. And back pain Baseline: 8/10 Goal status:progressing 10/16/23  3.  Patient will increase lumbar ROM 25% Baseline:as noted above Initial.  Met 09/29/23  4.  Patient will demonstrate improved functional LE strength as demonstrated by 5/5. Baseline: see above Goal status: progressing 10/08/23  5.  Patient will be able to ambulate 1000' without AD and normal gait pattern without increased pain to access community.  Baseline:  Goal status:met 09/16/23  6.  Decrease lumbar pain 50%  Goal status: met 10/14/23  7.  Report able to put shoes and socks on without pain > 4/10  Progressing 10/08/23  PLAN:  PT FREQUENCY: 2x/week  PT DURATION: 6 months  PLANNED INTERVENTIONS: Therapeutic exercises, Therapeutic activity, Neuromuscular re-education, Balance training, Gait training, Patient/Family education, Self Care, Joint mobilization, Stair training, Electrical stimulation, Cryotherapy, Moist heat, scar mobilization, Vasopneumatic device, and Manual therapy  PLAN FOR NEXT SESSION:  Next visit have her demo HEP and after surgery care verbalization  Patient Details  Name: [name] MRN: [id number] Date of Birth: December 24, 1960 Referring Provider:  Colon Shove, MD  Encounter Date: 10/20/2023   OBADIAH OZELL ORN, PT 10/20/2023, 3:40 PM

## 2023-10-21 ENCOUNTER — Encounter: Payer: Self-pay | Admitting: Professional Counselor

## 2023-10-21 ENCOUNTER — Ambulatory Visit: Admitting: Professional Counselor

## 2023-10-21 DIAGNOSIS — F331 Major depressive disorder, recurrent, moderate: Secondary | ICD-10-CM

## 2023-10-21 DIAGNOSIS — F411 Generalized anxiety disorder: Secondary | ICD-10-CM | POA: Diagnosis not present
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
## 2023-10-23 ENCOUNTER — Encounter: Payer: Self-pay | Admitting: Physical Therapy

## 2023-10-23 ENCOUNTER — Ambulatory Visit: Attending: Neurological Surgery | Admitting: Physical Therapy

## 2023-10-23 DIAGNOSIS — R600 Localized edema: Secondary | ICD-10-CM | POA: Insufficient documentation

## 2023-10-23 DIAGNOSIS — M6281 Muscle weakness (generalized): Secondary | ICD-10-CM | POA: Insufficient documentation

## 2023-10-23 DIAGNOSIS — M25661 Stiffness of right knee, not elsewhere classified: Secondary | ICD-10-CM | POA: Insufficient documentation

## 2023-10-23 DIAGNOSIS — M25662 Stiffness of left knee, not elsewhere classified: Secondary | ICD-10-CM | POA: Diagnosis present

## 2023-10-23 DIAGNOSIS — R2689 Other abnormalities of gait and mobility: Secondary | ICD-10-CM | POA: Insufficient documentation

## 2023-10-23 DIAGNOSIS — M25561 Pain in right knee: Secondary | ICD-10-CM | POA: Insufficient documentation

## 2023-10-23 DIAGNOSIS — G8929 Other chronic pain: Secondary | ICD-10-CM | POA: Insufficient documentation

## 2023-10-23 DIAGNOSIS — M25562 Pain in left knee: Secondary | ICD-10-CM | POA: Insufficient documentation

## 2023-10-23 DIAGNOSIS — R262 Difficulty in walking, not elsewhere classified: Secondary | ICD-10-CM | POA: Insufficient documentation

## 2023-10-23 DIAGNOSIS — M549 Dorsalgia, unspecified: Secondary | ICD-10-CM | POA: Diagnosis present

## 2023-10-23 DIAGNOSIS — Z96651 Presence of right artificial knee joint: Secondary | ICD-10-CM | POA: Diagnosis present

## 2023-10-23 NOTE — Therapy (Signed)
 OUTPATIENT PHYSICAL THERAPY LOWER EXTREMITY TREATMENT    Patient Name: [name] MRN: [id number] DOB:1960/12/27, 63 y.o., female Today's Date: 10/23/2023  END OF SESSION:  PT End of Session - 10/23/23 1539     Visit Number 15    Date for Recertification  11/21/23    Authorization Type [employer]    PT Start Time 1530    PT Stop Time 1615    PT Time Calculation (min) 45 min    Activity Tolerance Patient tolerated treatment well    Behavior During Therapy WFL for tasks assessed/performed            Past Medical History:  Diagnosis Date   Arthritis    Back pain of lumbar region with sciatica    Cancer (HCC)    breast   Depression    Diabetes mellitus, type II (HCC)    Family history of anesthesia complication    grandfather died under anesthesia 70's- had heart issues   H/O carpal tunnel repair 2014   Headache(784.0)    Hyperlipidemia    Hypertension    Hypothyroidism    Medial meniscus tear    PONV (postoperative nausea and vomiting)    Sleep apnea    cpap since 10   Past Surgical History:  Procedure Laterality Date   BILATERAL TOTAL MASTECTOMY WITH AXILLARY LYMPH NODE DISSECTION     BREAST RECONSTRUCTION     CERVICAL DISC ARTHROPLASTY N/A 03/19/2013   Procedure: CERVICAL FIVE TO SIX, CERVICAL SIX TO SEVEN CERVICAL ANTERIOR DISC ARTHROPLASTY;  Surgeon: Victory Gens, MD;  Location: [facility];  Service: Neurosurgery;  Laterality: N/A;  C56 C67 artificial disc replacement   CESAREAN SECTION     COLONOSCOPY     DILATATION & CURETTAGE/HYSTEROSCOPY WITH TRUECLEAR N/A 11/25/2013   Procedure: DILATATION & CURETTAGE/HYSTEROSCOPY WITH TRUCLEAR;  Surgeon: Charlie CHRISTELLA Croak, MD;  Location: [facility];  Service: Gynecology;  Laterality: N/A;   ECTOPIC PREGNANCY SURGERY     GALLBLADDER SURGERY     MOUTH SURGERY     child- dog bite   SPINAL FUSION     2023   STERIOD INJECTION Left 09/09/2022   Procedure: LEFT KNEE STEROID INJECTION;  Surgeon: Melodi Lerner, MD;  Location:  [facility];  Service: Orthopedics;  Laterality: Left;  left knee injection 0928   TONSILLECTOMY     TOTAL KNEE ARTHROPLASTY Right 09/09/2022   Procedure: TOTAL KNEE ARTHROPLASTY;  Surgeon: Melodi Lerner, MD;  Location: [facility];  Service: Orthopedics;  Laterality: Right;   TUBAL LIGATION     Patient Active Problem List   Diagnosis Date Noted   OA (osteoarthritis) of knee 09/09/2022   Exertional dyspnea 04/17/2022   Elevated coronary artery calcium  score 04/17/2022   Spondylolisthesis at L4-L5 level 12/27/2021   Ductal carcinoma in situ (DCIS) of left breast 01/19/2018   Generalized anxiety disorder 11/07/2017   Depression 11/07/2017   Insomnia 11/07/2017   Cervical spondylosis 03/19/2013    PCP: Norleen Jungling  REFERRING PROVIDER: Lerner Lawn  REFERRING DIAG: R TKA 09/09/22  THERAPY DIAG:  Chronic bilateral back pain, unspecified back location  S/P total knee arthroplasty, right  Stiffness of right knee, not elsewhere classified  Muscle weakness (generalized)  Acute pain of right knee  Localized edema  Rationale for Evaluation and Treatment: Rehabilitation  ONSET DATE: 09/09/22  SUBJECTIVE:   SUBJECTIVE STATEMENT :  Had a migraine today, not feeling great, pain in the left knee up to 7/10 with walking  Patient had right breast revision  on 07/17/23 due to scar encapsulation.  She is returning here due to the back pain.  Reports that she is doing a little better due to being less active with this surgery  Saw Dr. Colon last Friday, has a bulging disc on the right at L2, recommended continuation of PT.  Will have injection in back May 20th R TKA 8/19 L knee steroid injection 09/09/22  PAIN:  Are you having pain? Yes: NPRS scale: 6/10 Pain location: right low back, buttock and reports around to the front of the thigh Pain description: sharp really hurts, ache Aggravating factors: difficulty sleeping, difficulty walking and standing and sitting pain up to 9/10 trying to clean  the house recently Relieving factors: heat, Aleve , tramadol  and methacarbomol, the PT treatment was helping  PRECAUTIONS: None no baths, no pushing up, no weights with arms  RED FLAGS: None   WEIGHT BEARING RESTRICTIONS: No  FALLS:  Has patient fallen in last 6 months? No  LIVING ENVIRONMENT: Lives with: lives with their family and lives with their spouse Lives in: House/apartment Stairs: Yes: External: 3 steps; none Has following equipment at home: Single point cane and Walker - 2 wheeled  OCCUPATION: Not working  PLOF: Independent  PATIENT GOALS: less back pain  NEXT MD VISIT: none for Dr. Colon  OBJECTIVE:   PATIENT SURVEYS:    COGNITION: Overall cognitive status: Within functional limits for tasks assessed      EDEMA:    POSTURE: rounded shoulders and flexed trunk   PALPATION: Very tender in the right lumbar L2-5, some tenderness in the buttock  LOWER EXTREMITY ROM:  Active ROM Right eval   Right 09/26/22 Right 10/02/22  11/01/22 12/09/22  Hip flexion          Hip extension          Hip abduction          Hip adduction          Hip internal rotation          Hip external rotation          Knee flexion 110    82* AROM seated per pt request  78* self AAROM, 76* AROM  PROM 87 AROM 81 PROM 90 AROM 83 AROM 101 PROM 105  Knee extension 0    -16* supine heel prop with quad set  AROM 25 PROM 16 AROM 14 AROM 5 PROM 0  Ankle dorsiflexion          Ankle plantarflexion          Ankle inversion          Ankle eversion           (Blank rows = not tested)  LOWER EXTREMITY MMT:  MMT Right eval Left eval  Hip flexion 3+   Hip extension    Hip abduction    Hip adduction    Hip internal rotation    Hip external rotation    Knee flexion 4+   Knee extension 4+   Ankle dorsiflexion    Ankle plantarflexion    Ankle inversion    Ankle eversion     (Blank rows = not tested)  LUMBAR ROM:  decreased 25% flexion, decreased 75% for extension and side  bending, pain was worse with extension and right side bending. Hooklying is less painful that lying flat on back Mild tightness with pain during HS stretch, tight and painful piriformis bilaterally worse on the right , very weak core  FUNCTIONAL TESTS:  TUG 18 seconds no device  GAIT: No assistive device, slow mild antalgic on the right    TODAY'S TREATMENT:                                                                                                                              DATE:  10/23/23 Gait outside walking around the building Bike level 5 x 5 minutes Reviewed and performed HEP for post op, she had questions and needed some guidance.  Verbal and tactile cues needed STM to the right low back Manual lumbar traction  Passive LE stretches  10/20/23 Bike level 4 x 6 minutes Tmill pushes Reviewed and PT performed and demo of all the HEP from the pre surgery visit she had last week, went over the RICE and walking every hour, cues for quad activation and to decrease stress on back Manual pelvic sheet traction Passive LE stretches  10/16/23 Bike level 5 x 6 minutes Gait outside also back building 2 flights of stairs up and down and then up again Leg curls 25# 2x10 5# leg extension 20# lat pulls Feet on ball K2C, rotation, bridge, isometric abs Reviewed MD orders for TKA 11/10/23, reviewed the HEP they gave her  will need to go over this again  10/14/23 Gait outside around the back building brisk pace some SOB Bike LEvel 5 x 6 minutes Seated row 20# Lats 20# Passive LE and trunk stretches STM with the T-gun to the bilateral L/S area Manual sheet traction lumbar  10/08/23 Bike level 5 x 6 minutes Feet on ball K2C, rotation, small bridge, isometric abs Passive stretch LE's STM to the left low back and buttock Manual sheet traction  10/06/23 Bike level 5 x 6 minutes Gait around the building no rest brisk pace Seated rows 20# LAts 20# HS curls 25# PAssive stretch  LE's Manual sheet traction STM with the Tgun to the left low back and buttock  10/02/23 Gait outside around the building, down stairs 2x and up 1x no rest good pace Bike level 5 x 6 minutes 5# straight arm pulls  5# LEg extension 20# HS curls Feet on ball K2C, rotation, posterior activation, isometric abs Passive stretch HS STM to the left lumbar and buttock  09/29/23 Gait outside around the building 1.5 laps and then in the building and up and down the stairs Bike level 4 x 6 minutes 5# straight arm pulls with cues for posture and core 10# lats Feet on ball K2C, rotation, bridge, isometric abs STM with the Tgun to the bilateral low back area and into the HS Manual sheet traction gentle  09/25/23 Bike level 5 x 6 minutes Gait outside brisk pace 5# straight arm pulls 20# leg press 10# lats Green tband clamshells Ball b/n knees squeeze STM with the Tgun Manual sheet traction   09/16/23 Gait around the building good pace Bike level 5 x 6 minutes Red tband Row Red tband extension  Feet on ball K2C, rotation, bridge, gentle isometric abs Manual sheet traction STM to the low back and buttocks  09/04/23 Bike level 4 x 6 minutes LEg press 20# Stairs step over step Gait outside  Manual sheet traction STM to the right low back and hip Feet on ball k2c, rotation, bridge, PPT  09/01/23 Bike level 4 x 6 minutes Gait outside around the small parking [employer] step over step 2 full flights  Feet on ball K2C, rotation, small bridge, PPT's PAssive stretch LE's Manual sheet traction Discussion about lifting grandchild to protect breast and back   08/28/23 Bike level 4 x 8 minutes Leg curls 25# Leg extension 5# Feet on ball K2C, rotation, bridges, PPT Green tband clamshells Ball b/n knees small bridge STM to the buttock and low back Manual sheet traction  08/25/23 Bike level 4 x 8 minutes Walk outside good pace, SOB and a limp on the left Feet on ball K2C, rotation,  bridge, pelvic tilts needed a lot of verbal and tactile cues to get PPT Passive stretch LE's, calves are very tight calves, went over the stretch for home Manual sheet pelvic traction  08/21/23 Evaluation  PATIENT EDUCATION:  Education details: POC and HEP Person educated: Patient Education method: Explanation Education comprehension: verbalized understanding  HOME EXERCISE PROGRAM: Access Code: [facility] Access Code: [email or web address] URL: [email or web address] Date: 08/21/23 Prepared by: Ozell Mainland  Exercises - Hooklying Single Knee to Chest Stretch  - 2 x daily - 7 x weekly - 1 sets - 10 reps - 5 hold - Supine Double Knee to Chest  - 2 x daily - 7 x weekly - 1 sets - 10 reps - 5 hold - Supine Lower Trunk Rotation with PLB  - 2 x daily - 7 x weekly - 1 sets - 10 reps - 5 hold - Supine Posterior Pelvic Tilt  - 2 x daily - 7 x weekly - 1 sets - 10 reps - 3 hold  ASSESSMENT:  CLINICAL IMPRESSION:   Patient is scheduled for left TKA on 11/10/23, She has the new order and the pre op exercises and instructions, went over with her the pre op exercises again required verbal, visual and tactile cues.  She had a very difficult right TKA and has worries and wants to assure that she knows what to do  Patient has a right disc herniation at L2-3.   She had the right breast surgery June 26, has some limitations with lifting for this.  She is still having some significant low back pain.  Very tender in the right L2 area.  Fair strength.  Has left knee pain and has pain up to 8/10 with walking has a left TKA scheduled for 11/10/23.  She is going to have a grandchild due August 12. OBJECTIVE IMPAIRMENTS: Abnormal gait, cardiopulmonary status limiting activity, decreased activity tolerance, decreased endurance, decreased mobility, difficulty walking, decreased ROM, decreased strength, increased muscle spasms, impaired flexibility, improper body mechanics, postural dysfunction, and pain.    REHAB POTENTIAL: Good  CLINICAL DECISION MAKING: Stable/uncomplicated  EVALUATION COMPLEXITY: Low   GOALS: Goals reviewed with patient? Yes  SHORT TERM GOALS: Target date: 09/21/23  Patient will be independent with initial HEP. Goal status:  met 10/02/23   LONG TERM GOALS: Target date: 01/21/24  Patient will be independent with advanced/ongoing HEP to improve outcomes and carryover.  Goal status: initial  2.  Patient will report at least 75% improvement in R knee pain to improve QOL. And back pain Baseline:  8/10 Goal status:progressing 10/16/23  3.  Patient will increase lumbar ROM 25% Baseline:as noted above Initial.  Met 09/29/23  4.  Patient will demonstrate improved functional LE strength as demonstrated by 5/5. Baseline: see above Goal status: met 10/23/23  5.  Patient will be able to ambulate 1000' without AD and normal gait pattern without increased pain to access community.  Baseline:  Goal status:met 09/16/23  6.  Decrease lumbar pain 50%  Goal status: met 10/14/23  7.  Report able to put shoes and socks on without pain > 4/10  Progressing 10/08/23  PLAN:  PT FREQUENCY: 2x/week  PT DURATION: 6 months  PLANNED INTERVENTIONS: Therapeutic exercises, Therapeutic activity, Neuromuscular re-education, Balance training, Gait training, Patient/Family education, Self Care, Joint mobilization, Stair training, Electrical stimulation, Cryotherapy, Moist heat, scar mobilization, Vasopneumatic device, and Manual therapy  PLAN FOR NEXT SESSION:  Next visit have her demo HEP and after surgery care verbalization  Patient Details  Name: [name] MRN: [id number] Date of Birth: Dec 22, 1960 Referring Provider:  Colon Shove, MD  Encounter Date: 10/23/2023   OBADIAH OZELL ORN, PT 10/23/2023, 3:40 PM

## 2023-10-24 NOTE — Progress Notes (Signed)
 COVID Vaccine received:  []  No [x]  Yes Date of any COVID positive Test in last 90 days:  PCP - Norleen Jungling, MD clearance scanned in Media.  Cardiologist - Newman Lawrence, MD   no clearance requested per Kirke.   Chest x-ray -  EKG - 04-17-22    requested 07-17-23 EKG from [facility]  Stress Test - 05-06-22 ECHO - 05-16-22 Cardiac Cath -  CT Coronary Calcium  score: 28.2  on 04-29-2017  Pacemaker / ICD device [x]  No []  Yes   Spinal Cord Stimulator:[x]  No []  Yes       History of Sleep Apnea? []  No [x]  Yes   CPAP used?- []  No [x]  Yes    Patient has: []  NO Hx DM   [x]  Pre-DM   []  DM1  []   DM2 Does the patient monitor blood sugar?   []  N/A   []  No [x]  Yes  Last A1c was: 5.4 on   08-19-23    Blood Thinner / Instructions:  none Aspirin  Instructions:   none  Dental hx: []  Dentures:  []  N/A      []  Bridge or Partial:                   []  Loose or Damaged teeth:   Comments:   Activity level: Able to walk up 2 flights of stairs without becoming significantly short of breath or having chest pain?  []  No   []    Yes  Patient can perform ADLs without assistance. []  No   []   Yes  Anesthesia review: Pre-DM, HTN, GERD, Migraines, anxiety, s/p ACDF C5-6,C6-7 in 2015,  PONV  Patient denies any S&S of respiratory illness or Covid - no shortness of breath, fever, cough or chest pain at PAT appointment.  Patient verbalized understanding and agreement to the Pre-Surgical Instructions that were given to them at this PAT appointment. Patient was also educated of the need to review these PAT instructions again prior to her surgery.I reviewed the appropriate phone numbers to call if they have any and questions or concerns.

## 2023-10-25 NOTE — Patient Instructions (Signed)
 SURGICAL WAITING ROOM VISITATION Patients having surgery or a procedure may have no more than 2 support people in the waiting area - these visitors may rotate in the visitor waiting room.   If the patient needs to stay at the hospital during part of their recovery, the visitor guidelines for inpatient rooms apply.  PRE-OP VISITATION  Pre-op nurse will coordinate an appropriate time for 1 support person to accompany the patient in pre-op.  This support person may not rotate.  This visitor will be contacted when the time is appropriate for the visitor to come back in the pre-op area.  Please refer to the [facility] website for the visitor guidelines for Inpatients (after your surgery is over and you are in a regular room).  You are not required to quarantine at this time prior to your surgery. However, you must do this: Hand Hygiene often Do NOT share personal items Notify your provider if you are in close contact with someone who has COVID or you develop fever 100.4 or greater, new onset of sneezing, cough, sore throat, shortness of breath or body aches.  If you test positive for Covid or have been in contact with anyone that has tested positive in the last 10 days please notify you surgeon.    Your procedure is scheduled on:  MONDAY  November 10, 2023  Report to [facility] Main Entrance: [address] entrance where the [employer] is available.   Report to admitting at:  05:15   AM  Call this number if you have any questions or problems the morning of surgery [phone]  Do not eat food after Midnight the night prior to your surgery/procedure.  After Midnight you may have the following liquids until 04:15 AM DAY OF SURGERY  Clear Liquid Diet Water  Black Coffee (sugar ok, NO MILK/CREAM OR CREAMERS)  Tea (sugar ok, NO MILK/CREAM OR CREAMERS) regular and decaf                             Plain Jell-O  with no fruit (NO RED)                                           Fruit ices  (not with fruit pulp, NO RED)                                     Popsicles (NO RED)                                                                  Juice: NO CITRUS JUICES: only apple, WHITE grape, WHITE cranberry Sports drinks like Gatorade or Powerade (NO RED)                   The day of surgery:  Drink ONE (1) Pre-Surgery G2 at   04:15 AM the morning of surgery. Drink in one sitting. Do not sip.  This drink was given to you during your hospital pre-op appointment visit. Nothing else to drink after completing the  Pre-Surgery G2 : No candy, chewing gum or throat lozenges.    FOLLOW ANY ADDITIONAL PRE OP INSTRUCTIONS YOU RECEIVED FROM YOUR SURGEON'S OFFICE!!!   Oral Hygiene is also important to reduce your risk of infection.        Remember - BRUSH YOUR TEETH THE MORNING OF SURGERY WITH YOUR REGULAR TOOTHPASTE  Do NOT smoke after Midnight the night before surgery.  OZEMPIC- Do not inject for 10-14 days BEFORE your surgery.  Last injection will be on: ????  JARDIANCE - Stop taking 72 hours BEFORE surgery. Last dose will be taken on Thursday 11-06-23.  METFORMIN - Day BEFORE surgery, take as usual. DO NOT TAKE METFORMIN  THE DAY OF YOUR SURGERY.   STOP TAKING all Vitamins, Herbs and supplements 1 week before your surgery.   Take ONLY these medicines the morning of surgery with A SIP OF WATER : Armour Thyroid , Vilazodone , Gabapentin , and Alprazolam  if needed. You may use your Eye Drops and Nasal spray if needed.   DO NOT TAKE hydrochlorothiazide  (HCTZ) the morning of your surgery.   If You have been diagnosed with Sleep Apnea - Bring CPAP mask and tubing day of surgery. We will provide you with a CPAP machine on the day of your surgery.                   You may not have any metal on your body including hair pins, jewelry, and body piercing  Do not wear make-up, lotions, powders, perfumes  or deodorant  Do not wear nail polish including gel and S&S, artificial / acrylic nails, or  any other type of covering on natural nails including finger and toenails. If you have artificial nails, gel coating, etc., that needs to be removed by a nail salon, Please have this removed prior to surgery. Not doing so may mean that your surgery could be cancelled or delayed if the Surgeon or anesthesia staff feels like they are unable to monitor you safely.   Do not shave 48 hours prior to surgery to avoid nicks in your skin which may contribute to postoperative infections.   Contacts, Hearing Aids, dentures or bridgework may not be worn into surgery. DENTURES WILL BE REMOVED PRIOR TO SURGERY PLEASE DO NOT APPLY Poly grip OR ADHESIVES!!!  You may bring a small overnight bag with you on the day of surgery, only pack items that are not valuable. [facility] IS NOT RESPONSIBLE   FOR VALUABLES THAT ARE LOST OR STOLEN.   Do not bring your home medications to the hospital. The Pharmacy will dispense medications listed on your medication list to you during your admission in the Hospital.  Please read over the following fact sheets you were given: IF YOU HAVE QUESTIONS ABOUT YOUR PRE-OP INSTRUCTIONS, PLEASE CALL [phone].    Pre-operative 4 CHG Bath Instructions   You can play a key role in reducing the risk of infection after surgery. Your skin needs to be as free of germs as possible. You can reduce the number of germs on your skin by washing with CHG (chlorhexidine  gluconate) soap before surgery. CHG is an antiseptic soap that kills germs and continues to kill germs even after washing.   DO NOT use if you have an allergy to chlorhexidine /CHG or antibacterial soaps. If your skin becomes reddened or irritated, stop using the CHG and notify one of our RNs at   Please shower with the CHG soap starting 4 days before surgery using the following schedule:  starting [place] [place]  11-06-2023  Please keep in mind the following:  DO NOT shave, including legs and underarms, starting the day of your  first shower.   You may shave your face at any point before/day of surgery.  Place clean sheets on your bed the day you start using CHG soap. Use a clean washcloth (not used since being washed) for each shower. DO NOT sleep with pets once you start using the CHG.  CHG Shower Instructions:  If you choose to wash your hair and private area, wash first with your normal shampoo/soap.  After you use shampoo/soap, rinse your hair and body thoroughly to remove shampoo/soap residue.  Turn the water  OFF and apply about 3 tablespoons (45 ml) of CHG soap to a CLEAN washcloth.  Apply CHG soap ONLY FROM YOUR NECK DOWN TO YOUR TOES (washing for 3-5 minutes)  DO NOT use CHG soap on face, private areas, open wounds, or sores.  Pay special attention to the area where your surgery is being performed.  If you are having back surgery, having someone wash your back for you may be helpful. Wait 2 minutes after CHG soap is applied, then you may rinse off the CHG soap.  Pat dry with a clean towel  Put on clean clothes/pajamas   If you choose to wear lotion, please use ONLY the CHG-compatible lotions on the back of this paper.     Additional instructions for the day of surgery: DO NOT APPLY any lotions, deodorants, cologne, or perfumes.   Put on clean/comfortable clothes.  Brush your teeth.  Ask your nurse before applying any prescription medications to the skin.   CHG Compatible Lotions   Aveeno Moisturizing lotion  Cetaphil Moisturizing Cream  Cetaphil Moisturizing Lotion  Clairol Herbal Essence Moisturizing Lotion, Dry Skin  Clairol Herbal Essence Moisturizing Lotion, Extra Dry Skin  Clairol Herbal Essence Moisturizing Lotion, Normal Skin  Curel Age Defying Therapeutic Moisturizing Lotion with Alpha Hydroxy  Curel Extreme Care Body Lotion  Curel Soothing Hands Moisturizing Hand Lotion  Curel Therapeutic Moisturizing Cream, Fragrance-Free  Curel Therapeutic Moisturizing Lotion, Fragrance-Free  Curel  Therapeutic Moisturizing Lotion, Original Formula  Eucerin Daily Replenishing Lotion  Eucerin Dry Skin Therapy Plus Alpha Hydroxy Crme  Eucerin Dry Skin Therapy Plus Alpha Hydroxy Lotion  Eucerin Original Crme  Eucerin Original Lotion  Eucerin Plus Crme Eucerin Plus Lotion  Eucerin TriLipid Replenishing Lotion  Keri Anti-Bacterial Hand Lotion  Keri Deep Conditioning Original Lotion Dry Skin Formula Softly Scented  Keri Deep Conditioning Original Lotion, Fragrance Free Sensitive Skin Formula  Keri Lotion Fast Absorbing Fragrance Free Sensitive Skin Formula  Keri Lotion Fast Absorbing Softly Scented Dry Skin Formula  Keri Original Lotion  Keri Skin Renewal Lotion Keri Silky Smooth Lotion  Keri Silky Smooth Sensitive Skin Lotion  Nivea Body Creamy Conditioning Oil  Nivea Body Extra Enriched Lotion  Nivea Body Original Lotion  Nivea Body Sheer Moisturizing Lotion Nivea Crme  Nivea Skin Firming Lotion  NutraDerm 30 Skin Lotion  NutraDerm Skin Lotion  NutraDerm Therapeutic Skin Cream  NutraDerm Therapeutic Skin Lotion  ProShield Protective Hand Cream  Provon moisturizing lotion   FAILURE TO FOLLOW THESE INSTRUCTIONS MAY RESULT IN THE CANCELLATION OF YOUR SURGERY  PATIENT SIGNATURE_________________________________  NURSE SIGNATURE__________________________________  ________________________________________________________________________         Nasario Exon    An incentive spirometer is a tool that can help keep your lungs clear and active. This tool measures how well you are filling your lungs with each breath. Taking long deep breaths may  help reverse or decrease the chance of developing breathing (pulmonary) problems (especially infection) following: A long period of time when you are unable to move or be active. BEFORE THE PROCEDURE  If the spirometer includes an indicator to show your best effort, your nurse or respiratory therapist will set it to a desired  goal. If possible, sit up straight or lean slightly forward. Try not to slouch. Hold the incentive spirometer in an upright position. INSTRUCTIONS FOR USE  Sit on the edge of your bed if possible, or sit up as far as you can in bed or on a chair. Hold the incentive spirometer in an upright position. Breathe out normally. Place the mouthpiece in your mouth and seal your lips tightly around it. Breathe in slowly and as deeply as possible, raising the piston or the ball toward the top of the column. Hold your breath for 3-5 seconds or for as long as possible. Allow the piston or ball to fall to the bottom of the column. Remove the mouthpiece from your mouth and breathe out normally. Rest for a few seconds and repeat Steps 1 through 7 at least 10 times every 1-2 hours when you are awake. Take your time and take a few normal breaths between deep breaths. The spirometer may include an indicator to show your best effort. Use the indicator as a goal to work toward during each repetition. After each set of 10 deep breaths, practice coughing to be sure your lungs are clear. If you have an incision (the cut made at the time of surgery), support your incision when coughing by placing a pillow or rolled up towels firmly against it. Once you are able to get out of bed, walk around indoors and cough well. You may stop using the incentive spirometer when instructed by your caregiver.  RISKS AND COMPLICATIONS Take your time so you do not get dizzy or light-headed. If you are in pain, you may need to take or ask for pain medication before doing incentive spirometry. It is harder to take a deep breath if you are having pain. AFTER USE Rest and breathe slowly and easily. It can be helpful to keep track of a log of your progress. Your caregiver can provide you with a simple table to help with this. If you are using the spirometer at home, follow these instructions: SEEK MEDICAL CARE IF:  You are having difficultly  using the spirometer. You have trouble using the spirometer as often as instructed. Your pain medication is not giving enough relief while using the spirometer. You develop fever of 100.5 F (38.1 C) or higher.                                                                                                    SEEK IMMEDIATE MEDICAL CARE IF:  You cough up bloody sputum that had not been present before. You develop fever of 102 F (38.9 C) or greater. You develop worsening pain at or near the incision site. MAKE SURE YOU:  Understand these instructions. Will watch your condition. Will get help right  away if you are not doing well or get worse. Document Released: 05/20/2006 Document Revised: 04/01/2011 Document Reviewed: 07/21/2006 [facility] Patient Information 2014 [employer], [place].          If you would like to see a video about joint replacement:   [email or web address]

## 2023-10-27 ENCOUNTER — Encounter: Payer: Self-pay | Admitting: Physical Therapy

## 2023-10-27 ENCOUNTER — Ambulatory Visit: Admitting: Physical Therapy

## 2023-10-27 DIAGNOSIS — M6281 Muscle weakness (generalized): Secondary | ICD-10-CM

## 2023-10-27 DIAGNOSIS — R600 Localized edema: Secondary | ICD-10-CM

## 2023-10-27 DIAGNOSIS — Z96651 Presence of right artificial knee joint: Secondary | ICD-10-CM

## 2023-10-27 DIAGNOSIS — M549 Dorsalgia, unspecified: Secondary | ICD-10-CM | POA: Diagnosis not present

## 2023-10-27 DIAGNOSIS — M25561 Pain in right knee: Secondary | ICD-10-CM

## 2023-10-27 DIAGNOSIS — M25661 Stiffness of right knee, not elsewhere classified: Secondary | ICD-10-CM

## 2023-10-27 DIAGNOSIS — G8929 Other chronic pain: Secondary | ICD-10-CM

## 2023-10-27 NOTE — Therapy (Signed)
 OUTPATIENT PHYSICAL THERAPY LOWER EXTREMITY TREATMENT    Patient Name: [name] MRN: [id number] DOB:1960/04/23, 63 y.o., female Today's Date: 10/27/2023  END OF SESSION:  PT End of Session - 10/27/23 1537     Visit Number 16    Date for Recertification  11/21/23    Authorization Type [employer]    PT Start Time 1529    PT Stop Time 1615    PT Time Calculation (min) 46 min    Activity Tolerance Patient tolerated treatment well    Behavior During Therapy WFL for tasks assessed/performed            Past Medical History:  Diagnosis Date   Arthritis    Back pain of lumbar region with sciatica    Cancer (HCC)    breast   Depression    Diabetes mellitus, type II (HCC)    Family history of anesthesia complication    grandfather died under anesthesia 70's- had heart issues   H/O carpal tunnel repair 2014   Headache(784.0)    Hyperlipidemia    Hypertension    Hypothyroidism    Medial meniscus tear    PONV (postoperative nausea and vomiting)    Sleep apnea    cpap since 10   Past Surgical History:  Procedure Laterality Date   BILATERAL TOTAL MASTECTOMY WITH AXILLARY LYMPH NODE DISSECTION     BREAST RECONSTRUCTION     CERVICAL DISC ARTHROPLASTY N/A 03/19/2013   Procedure: CERVICAL FIVE TO SIX, CERVICAL SIX TO SEVEN CERVICAL ANTERIOR DISC ARTHROPLASTY;  Surgeon: Victory Gens, MD;  Location: [facility];  Service: Neurosurgery;  Laterality: N/A;  C56 C67 artificial disc replacement   CESAREAN SECTION     COLONOSCOPY     DILATATION & CURETTAGE/HYSTEROSCOPY WITH TRUECLEAR N/A 11/25/2013   Procedure: DILATATION & CURETTAGE/HYSTEROSCOPY WITH TRUCLEAR;  Surgeon: Charlie CHRISTELLA Croak, MD;  Location: [facility];  Service: Gynecology;  Laterality: N/A;   ECTOPIC PREGNANCY SURGERY     GALLBLADDER SURGERY     MOUTH SURGERY     child- dog bite   SPINAL FUSION     2023   STERIOD INJECTION Left 09/09/2022   Procedure: LEFT KNEE STEROID INJECTION;  Surgeon: Melodi Lerner, MD;  Location:  [facility];  Service: Orthopedics;  Laterality: Left;  left knee injection 0928   TONSILLECTOMY     TOTAL KNEE ARTHROPLASTY Right 09/09/2022   Procedure: TOTAL KNEE ARTHROPLASTY;  Surgeon: Melodi Lerner, MD;  Location: [facility];  Service: Orthopedics;  Laterality: Right;   TUBAL LIGATION     Patient Active Problem List   Diagnosis Date Noted   OA (osteoarthritis) of knee 09/09/2022   Exertional dyspnea 04/17/2022   Elevated coronary artery calcium  score 04/17/2022   Spondylolisthesis at L4-L5 level 12/27/2021   Ductal carcinoma in situ (DCIS) of left breast 01/19/2018   Generalized anxiety disorder 11/07/2017   Depression 11/07/2017   Insomnia 11/07/2017   Cervical spondylosis 03/19/2013    PCP: Norleen Jungling  REFERRING PROVIDER: Lerner Lawn  REFERRING DIAG: R TKA 09/09/22  THERAPY DIAG:  Chronic bilateral back pain, unspecified back location  S/P total knee arthroplasty, right  Stiffness of right knee, not elsewhere classified  Muscle weakness (generalized)  Acute pain of right knee  Localized edema  Rationale for Evaluation and Treatment: Rehabilitation  ONSET DATE: 09/09/22  SUBJECTIVE:   SUBJECTIVE STATEMENT :  Patient will have left TKA on 11/10/23 2 weeks from today, reports knee is doing okay, the back is hurting  Patient had  right breast revision on 07/17/23 due to scar encapsulation.  She is returning here due to the back pain.  Reports that she is doing a little better due to being less active with this surgery  Saw Dr. Colon last Friday, has a bulging disc on the right at L2, recommended continuation of PT.  Will have injection in back May 20th R TKA 8/19 L knee steroid injection 09/09/22  PAIN:  Are you having pain? Yes: NPRS scale: 6/10 Pain location: right low back, buttock and reports around to the front of the thigh Pain description: sharp really hurts, ache Aggravating factors: difficulty sleeping, difficulty walking and standing and sitting pain up to  9/10 trying to clean the house recently Relieving factors: heat, Aleve , tramadol  and methacarbomol, the PT treatment was helping  PRECAUTIONS: None no baths, no pushing up, no weights with arms  RED FLAGS: None   WEIGHT BEARING RESTRICTIONS: No  FALLS:  Has patient fallen in last 6 months? No  LIVING ENVIRONMENT: Lives with: lives with their family and lives with their spouse Lives in: House/apartment Stairs: Yes: External: 3 steps; none Has following equipment at home: Single point cane and Walker - 2 wheeled  OCCUPATION: Not working  PLOF: Independent  PATIENT GOALS: less back pain  NEXT MD VISIT: none for Dr. Colon  OBJECTIVE:   PATIENT SURVEYS:    COGNITION: Overall cognitive status: Within functional limits for tasks assessed      EDEMA:    POSTURE: rounded shoulders and flexed trunk   PALPATION: Very tender in the right lumbar L2-5, some tenderness in the buttock  LOWER EXTREMITY ROM:  Active ROM Right eval   Right 09/26/22 Right 10/02/22  11/01/22 12/09/22  Hip flexion          Hip extension          Hip abduction          Hip adduction          Hip internal rotation          Hip external rotation          Knee flexion 110    82* AROM seated per pt request  78* self AAROM, 76* AROM  PROM 87 AROM 81 PROM 90 AROM 83 AROM 101 PROM 105  Knee extension 0    -16* supine heel prop with quad set  AROM 25 PROM 16 AROM 14 AROM 5 PROM 0  Ankle dorsiflexion          Ankle plantarflexion          Ankle inversion          Ankle eversion           (Blank rows = not tested)  LOWER EXTREMITY MMT:  MMT Right eval Left eval  Hip flexion 3+   Hip extension    Hip abduction    Hip adduction    Hip internal rotation    Hip external rotation    Knee flexion 4+   Knee extension 4+   Ankle dorsiflexion    Ankle plantarflexion    Ankle inversion    Ankle eversion     (Blank rows = not tested)  LUMBAR ROM:  decreased 25% flexion, decreased 75% for  extension and side bending, pain was worse with extension and right side bending. Hooklying is less painful that lying flat on back Mild tightness with pain during HS stretch, tight and painful piriformis bilaterally worse on the right , very  weak core  FUNCTIONAL TESTS:  TUG 18 seconds no device  GAIT: No assistive device, slow mild antalgic on the right    TODAY'S TREATMENT:                                                                                                                              DATE:  10/27/23 Gait outside brisk pace around the back building Bike level  x 6 minutes seat position 6 Had her demo HEP for post op with minimal cues from me, she did have questions LEg press able to go to position #1 without weight and do position # 4 with 20# Manual sheet traction STM to the low back mms  10/23/23 Gait outside walking around the building Bike level 5 x 5 minutes Reviewed and performed HEP for post op, she had questions and needed some guidance.  Verbal and tactile cues needed STM to the right low back Manual lumbar traction  Passive LE stretches  10/20/23 Bike level 4 x 6 minutes Tmill pushes Reviewed and PT performed and demo of all the HEP from the pre surgery visit she had last week, went over the RICE and walking every hour, cues for quad activation and to decrease stress on back Manual pelvic sheet traction Passive LE stretches  10/16/23 Bike level 5 x 6 minutes Gait outside also back building 2 flights of stairs up and down and then up again Leg curls 25# 2x10 5# leg extension 20# lat pulls Feet on ball K2C, rotation, bridge, isometric abs Reviewed MD orders for TKA 11/10/23, reviewed the HEP they gave her  will need to go over this again  10/14/23 Gait outside around the back building brisk pace some SOB Bike LEvel 5 x 6 minutes Seated row 20# Lats 20# Passive LE and trunk stretches STM with the T-gun to the bilateral L/S area Manual sheet  traction lumbar  10/08/23 Bike level 5 x 6 minutes Feet on ball K2C, rotation, small bridge, isometric abs Passive stretch LE's STM to the left low back and buttock Manual sheet traction  10/06/23 Bike level 5 x 6 minutes Gait around the building no rest brisk pace Seated rows 20# LAts 20# HS curls 25# PAssive stretch LE's Manual sheet traction STM with the Tgun to the left low back and buttock  10/02/23 Gait outside around the building, down stairs 2x and up 1x no rest good pace Bike level 5 x 6 minutes 5# straight arm pulls  5# LEg extension 20# HS curls Feet on ball K2C, rotation, posterior activation, isometric abs Passive stretch HS STM to the left lumbar and buttock  09/29/23 Gait outside around the building 1.5 laps and then in the building and up and down the stairs Bike level 4 x 6 minutes 5# straight arm pulls with cues for posture and core 10# lats Feet on ball K2C, rotation, bridge, isometric abs STM with the Tgun to the bilateral low back area  and into the HS Manual sheet traction gentle  09/25/23 Bike level 5 x 6 minutes Gait outside brisk pace 5# straight arm pulls 20# leg press 10# lats Green tband clamshells Ball b/n knees squeeze STM with the Tgun Manual sheet traction   09/16/23 Gait around the building good pace Bike level 5 x 6 minutes Red tband Row Red tband extension Feet on ball K2C, rotation, bridge, gentle isometric abs Manual sheet traction STM to the low back and buttocks  09/04/23 Bike level 4 x 6 minutes LEg press 20# Stairs step over step Gait outside  Manual sheet traction STM to the right low back and hip Feet on ball k2c, rotation, bridge, PPT  09/01/23 Bike level 4 x 6 minutes Gait outside around the small parking [employer] step over step 2 full flights  Feet on ball K2C, rotation, small bridge, PPT's PAssive stretch LE's Manual sheet traction Discussion about lifting grandchild to protect breast and  back   08/28/23 Bike level 4 x 8 minutes Leg curls 25# Leg extension 5# Feet on ball K2C, rotation, bridges, PPT Green tband clamshells Ball b/n knees small bridge STM to the buttock and low back Manual sheet traction  08/25/23 Bike level 4 x 8 minutes Walk outside good pace, SOB and a limp on the left Feet on ball K2C, rotation, bridge, pelvic tilts needed a lot of verbal and tactile cues to get PPT Passive stretch LE's, calves are very tight calves, went over the stretch for home Manual sheet pelvic traction  08/21/23 Evaluation  PATIENT EDUCATION:  Education details: POC and HEP Person educated: Patient Education method: Explanation Education comprehension: verbalized understanding  HOME EXERCISE PROGRAM: Access Code: [facility] Access Code: [email or web address] URL: [email or web address] Date: 08/21/23 Prepared by: Ozell Mainland  Exercises - Hooklying Single Knee to Chest Stretch  - 2 x daily - 7 x weekly - 1 sets - 10 reps - 5 hold - Supine Double Knee to Chest  - 2 x daily - 7 x weekly - 1 sets - 10 reps - 5 hold - Supine Lower Trunk Rotation with PLB  - 2 x daily - 7 x weekly - 1 sets - 10 reps - 5 hold - Supine Posterior Pelvic Tilt  - 2 x daily - 7 x weekly - 1 sets - 10 reps - 3 hold  ASSESSMENT:  CLINICAL IMPRESSION:   Patient is scheduled for left TKA on 11/10/23, Continue to work on her post op exercises so she knows what to do, as she was really behind last time and struggled  Patient has a right disc herniation at L2-3.   She had the right breast surgery June 26, has some limitations with lifting for this.  She is still having some significant low back pain.  Very tender in the right L2 area.  Fair strength.  Has left knee pain and has pain up to 8/10 with walking has a left TKA scheduled for 11/10/23.  She is going to have a grandchild due August 12. OBJECTIVE IMPAIRMENTS: Abnormal gait, cardiopulmonary status limiting activity, decreased activity  tolerance, decreased endurance, decreased mobility, difficulty walking, decreased ROM, decreased strength, increased muscle spasms, impaired flexibility, improper body mechanics, postural dysfunction, and pain.   REHAB POTENTIAL: Good  CLINICAL DECISION MAKING: Stable/uncomplicated  EVALUATION COMPLEXITY: Low   GOALS: Goals reviewed with patient? Yes  SHORT TERM GOALS: Target date: 09/21/23  Patient will be independent with initial HEP. Goal status:  met 10/02/23   LONG TERM GOALS: Target date:  01/21/24  Patient will be independent with advanced/ongoing HEP to improve outcomes and carryover.  Goal status: initial  2.  Patient will report at least 75% improvement in R knee pain to improve QOL. And back pain Baseline: 8/10 Goal status:progressing 10/16/23  3.  Patient will increase lumbar ROM 25% Baseline:as noted above Initial.  Met 09/29/23  4.  Patient will demonstrate improved functional LE strength as demonstrated by 5/5. Baseline: see above Goal status: met 10/23/23  5.  Patient will be able to ambulate 1000' without AD and normal gait pattern without increased pain to access community.  Baseline:  Goal status:met 09/16/23  6.  Decrease lumbar pain 50%  Goal status: met 10/14/23  7.  Report able to put shoes and socks on without pain > 4/10  Progressing 10/08/23  PLAN:  PT FREQUENCY: 2x/week  PT DURATION: 6 months  PLANNED INTERVENTIONS: Therapeutic exercises, Therapeutic activity, Neuromuscular re-education, Balance training, Gait training, Patient/Family education, Self Care, Joint mobilization, Stair training, Electrical stimulation, Cryotherapy, Moist heat, scar mobilization, Vasopneumatic device, and Manual therapy  PLAN FOR NEXT SESSION:  Next visit have her demo HEP and after surgery care verbalization  Patient Details  Name: GRACEANN BOILEAU MRN: [id number] Date of Birth: 1960/10/21 Referring Provider:  Colon Shove, MD  Encounter Date:  10/27/2023   OBADIAH OZELL ORN, PT 10/27/2023, 3:38 PM

## 2023-10-28 ENCOUNTER — Other Ambulatory Visit: Payer: Self-pay

## 2023-10-28 ENCOUNTER — Encounter (HOSPITAL_COMMUNITY)
Admission: RE | Admit: 2023-10-28 | Discharge: 2023-10-28 | Disposition: A | Discharge status: Home / Self Care | Source: Ambulatory Visit | Attending: Orthopedic Surgery | Admitting: Orthopedic Surgery

## 2023-10-28 ENCOUNTER — Encounter (HOSPITAL_COMMUNITY): Payer: Self-pay

## 2023-10-28 VITALS — BP 142/90 | HR 79 | Temp 97.5°F | Resp 20 | Ht 62.0 in | Wt 201.0 lb

## 2023-10-28 DIAGNOSIS — M1712 Unilateral primary osteoarthritis, left knee: Secondary | ICD-10-CM | POA: Diagnosis not present

## 2023-10-28 DIAGNOSIS — I10 Essential (primary) hypertension: Secondary | ICD-10-CM | POA: Diagnosis not present

## 2023-10-28 DIAGNOSIS — G4733 Obstructive sleep apnea (adult) (pediatric): Secondary | ICD-10-CM | POA: Insufficient documentation

## 2023-10-28 DIAGNOSIS — E039 Hypothyroidism, unspecified: Secondary | ICD-10-CM | POA: Diagnosis not present

## 2023-10-28 DIAGNOSIS — Z7985 Long-term (current) use of injectable non-insulin antidiabetic drugs: Secondary | ICD-10-CM | POA: Insufficient documentation

## 2023-10-28 DIAGNOSIS — F32A Depression, unspecified: Secondary | ICD-10-CM | POA: Insufficient documentation

## 2023-10-28 DIAGNOSIS — Z01812 Encounter for preprocedural laboratory examination: Secondary | ICD-10-CM | POA: Diagnosis present

## 2023-10-28 DIAGNOSIS — Z01818 Encounter for other preprocedural examination: Secondary | ICD-10-CM

## 2023-10-28 DIAGNOSIS — Z7984 Long term (current) use of oral hypoglycemic drugs: Secondary | ICD-10-CM | POA: Insufficient documentation

## 2023-10-28 DIAGNOSIS — F419 Anxiety disorder, unspecified: Secondary | ICD-10-CM | POA: Diagnosis not present

## 2023-10-28 DIAGNOSIS — I2510 Atherosclerotic heart disease of native coronary artery without angina pectoris: Secondary | ICD-10-CM | POA: Diagnosis not present

## 2023-10-28 DIAGNOSIS — E119 Type 2 diabetes mellitus without complications: Secondary | ICD-10-CM | POA: Diagnosis not present

## 2023-10-28 DIAGNOSIS — R7303 Prediabetes: Secondary | ICD-10-CM

## 2023-10-28 HISTORY — DX: Prediabetes: R73.03

## 2023-10-28 HISTORY — DX: Anxiety disorder, unspecified: F41.9

## 2023-10-28 LAB — HEMOGLOBIN A1C
Hgb A1c MFr Bld: 4.7 % — ABNORMAL LOW (ref 4.8–5.6)
Mean Plasma Glucose: 88.19 mg/dL

## 2023-10-28 LAB — CBC
HCT: 49.1 % — ABNORMAL HIGH (ref 36.0–46.0)
Hemoglobin: 16.1 g/dL — ABNORMAL HIGH (ref 12.0–15.0)
MCH: 28.9 pg (ref 26.0–34.0)
MCHC: 32.8 g/dL (ref 30.0–36.0)
MCV: 88 fL (ref 80.0–100.0)
Platelets: 203 K/uL (ref 150–400)
RBC: 5.58 MIL/uL — ABNORMAL HIGH (ref 3.87–5.11)
RDW: 13 % (ref 11.5–15.5)
WBC: 6.7 K/uL (ref 4.0–10.5)
nRBC: 0 % (ref 0.0–0.2)

## 2023-10-28 LAB — BASIC METABOLIC PANEL WITH GFR
Anion gap: 11 (ref 5–15)
BUN: 16 mg/dL (ref 8–23)
CO2: 26 mmol/L (ref 22–32)
Calcium: 9.7 mg/dL (ref 8.9–10.3)
Chloride: 104 mmol/L (ref 98–111)
Creatinine, Ser: 0.7 mg/dL (ref 0.44–1.00)
GFR, Estimated: >60 mL/min (ref >60)
Glucose, Bld: 93 mg/dL (ref 70–99)
Potassium: 4 mmol/L (ref 3.5–5.1)
Sodium: 140 mmol/L (ref 135–145)

## 2023-10-28 LAB — SURGICAL PCR SCREEN
MRSA, PCR: NEGATIVE
Staphylococcus aureus: NEGATIVE

## 2023-10-29 ENCOUNTER — Encounter (HOSPITAL_COMMUNITY): Payer: Self-pay

## 2023-10-29 NOTE — Anesthesia Preprocedure Evaluation (Addendum)
 Anesthesia Evaluation  Patient identified by MRN, date of birth, ID band Patient awake    Reviewed: Allergy & Precautions, H&P , NPO status , Patient's Chart, lab work & pertinent test results  History of Anesthesia Complications (+) PONV, Family history of anesthesia reaction and history of anesthetic complications  Airway Mallampati: III  TM Distance: >3 FB Neck ROM: Full    Dental no notable dental hx. (+) Teeth Intact, Dental Advisory Given   Pulmonary neg pulmonary ROS, sleep apnea and Continuous Positive Airway Pressure Ventilation    Pulmonary exam normal breath sounds clear to auscultation       Cardiovascular Exercise Tolerance: Good hypertension, Pt. on medications + CAD   Rhythm:Regular Rate:Normal     Neuro/Psych  Headaches  Anxiety Depression    negative neurological ROS  negative psych ROS   GI/Hepatic negative GI ROS, Neg liver ROS,,,  Endo/Other  negative endocrine ROSdiabetes, Type 2Hypothyroidism    Renal/GU negative Renal ROS  negative genitourinary   Musculoskeletal   Abdominal   Peds  Hematology negative hematology ROS (+)   Anesthesia Other Findings   Reproductive/Obstetrics negative OB ROS                              Anesthesia Physical Anesthesia Plan  ASA: 3  Anesthesia Plan: General   Post-op Pain Management: Regional block* and Ofirmev  IV (intra-op)*   Induction: Intravenous  PONV Risk Score and Plan: 4 or greater and Ondansetron , Dexamethasone , Midazolam , Propofol  infusion, TIVA and Scopolamine  patch - Pre-op  Airway Management Planned: LMA  Additional Equipment:   Intra-op Plan:   Post-operative Plan: Extubation in OR  Informed Consent: I have reviewed the patients History and Physical, chart, labs and discussed the procedure including the risks, benefits and alternatives for the proposed anesthesia with the patient or authorized  representative who has indicated his/her understanding and acceptance.     Dental advisory given  Plan Discussed with: CRNA  Anesthesia Plan Comments: (See PAT note from 10/7)         Anesthesia Quick Evaluation

## 2023-10-29 NOTE — Progress Notes (Addendum)
 Case: [id number] Date/Time: 11/10/23 0700   Procedure: ARTHROPLASTY, KNEE, TOTAL (Left: Knee)   Anesthesia type: Choice   Pre-op diagnosis: Left knee osteoarthritis   [facility]   Surgeons: Melodi Lerner, MD       DISCUSSION: [name] is a 62 yo female with PMH of HTN, CAD (by CT), OSA (uses CPAP), hypothyroid, DM, anxiety, depression, arthritis, hx of PLIF L3-5 (12/2021)  Prior anesthesia complications include PONV  Patient evaluated by Cardiology in 03/2022 due to reports of DOE with evidence of CAD on imaging. She underwent stress testing and echo which came back normal. She was advised to continue medical management of CAD and f/u prn  Medical clearance (scanned in media on 7/23) that patient is cleared and low risk   VS: BP (!) 142/90 Comment: right arm sitting  Pulse 79   Temp (!) 36.4 C (Oral)   Resp 20   Ht 5' 2 (1.575 m)   Wt 91.2 kg   LMP 11/12/2013   SpO2 100%   BMI 36.76 kg/m   PROVIDERS: Onita Rush, MD   LABS: Labs reviewed: Acceptable for surgery. (all labs ordered are listed, but only abnormal results are displayed)  Labs Reviewed  HEMOGLOBIN A1C - Abnormal; Notable for the following components:      Result Value   Hgb A1c MFr Bld 4.7 (*)    All other components within normal limits  CBC - Abnormal; Notable for the following components:   RBC 5.58 (*)    Hemoglobin 16.1 (*)    HCT 49.1 (*)    All other components within normal limits  SURGICAL PCR SCREEN  BASIC METABOLIC PANEL WITH GFR     IMAGES:   EKG 07/17/23 (scanned in media on 10/28/23:  NSR Possible LAE Possible inferior infarct, age undetermined Anterior infarct, age undetermined   Echocardiogram 05/16/2022: Left ventricle cavity is normal in size. Mild concentric hypertrophy of the left ventricle. Normal global wall motion. Normal LV systolic function with EF 56%. Normal diastolic filling pattern. No significant valvular abnormality. No evidence of  pulmonary hypertension. Compared to previous study in 2021, moderate LA dilatation absent.  Lexiscan  Sestamibi stress test 05/06/2022: Lexiscan  nuclear stress test performed using 1-day protocol. Normal myocardial perfusion. Stress LVEF 59%. Low risk study.   Past Medical History:  Diagnosis Date   Anxiety    Arthritis    Back pain of lumbar region with sciatica    Cancer (HCC)    breast   Depression    Family history of anesthesia complication    grandfather died under anesthesia 70's- had heart issues   H/O carpal tunnel repair 2014   Headache(784.0)    Hyperlipidemia    Hypertension    Hypothyroidism    Medial meniscus tear    PONV (postoperative nausea and vomiting)    Pre-diabetes    Sleep apnea    cpap since 10    Past Surgical History:  Procedure Laterality Date   BILATERAL TOTAL MASTECTOMY WITH AXILLARY LYMPH NODE DISSECTION     BREAST RECONSTRUCTION Left 07/17/2022   for encapsulation,  done at [facility]   CERVICAL DISC ARTHROPLASTY N/A 03/19/2013   Procedure: CERVICAL FIVE TO SIX, CERVICAL SIX TO SEVEN CERVICAL ANTERIOR DISC ARTHROPLASTY;  Surgeon: Victory Gens, MD;  Location: [facility];  Service: Neurosurgery;  Laterality: N/A;  C56 C67 artificial disc replacement   CESAREAN SECTION     COLONOSCOPY     DILATATION & CURETTAGE/HYSTEROSCOPY WITH TRUECLEAR N/A  11/25/2013   Procedure: DILATATION & CURETTAGE/HYSTEROSCOPY WITH TRUCLEAR;  Surgeon: Charlie CHRISTELLA Croak, MD;  Location: [facility];  Service: Gynecology;  Laterality: N/A;   ECTOPIC PREGNANCY SURGERY     GALLBLADDER SURGERY     MOUTH SURGERY     child- dog bite   SPINAL FUSION     2023   STERIOD INJECTION Left 09/09/2022   Procedure: LEFT KNEE STEROID INJECTION;  Surgeon: Melodi Lerner, MD;  Location: [facility];  Service: Orthopedics;  Laterality: Left;  left knee injection 0928   TONSILLECTOMY     TOTAL KNEE ARTHROPLASTY Right 09/09/2022   Procedure: TOTAL KNEE ARTHROPLASTY;  Surgeon: Melodi Lerner, MD;   Location: [facility];  Service: Orthopedics;  Laterality: Right;   TUBAL LIGATION      MEDICATIONS:  ALPRAZolam  (XANAX ) 0.5 MG tablet   aspirin  EC 81 MG tablet   atorvastatin  (LIPITOR) 20 MG tablet   Azelastine HCl 137 MCG/SPRAY SOLN   doxazosin  (CARDURA ) 4 MG tablet   EPINEPHrine  0.3 mg/0.3 mL IJ SOAJ injection   famotidine -calcium  carbonate-magnesium  hydroxide (PEPCID  COMPLETE) 10-800-165 MG chewable tablet   gabapentin  (NEURONTIN ) 100 MG capsule   gabapentin  (NEURONTIN ) 300 MG capsule   Galcanezumab-gnlm 120 MG/ML SOAJ   GOODSENSE PSYLLIUM FIBER PO   hydrochlorothiazide  (MICROZIDE ) 12.5 MG capsule   JARDIANCE  10 MG TABS tablet   Lemborexant  (DAYVIGO ) 10 MG TABS   meloxicam  (MOBIC ) 7.5 MG tablet   metFORMIN  (GLUCOPHAGE ) 500 MG tablet   methocarbamol  (ROBAXIN ) 500 MG tablet   mometasone (ELOCON) 0.1 % lotion   montelukast (SINGULAIR) 10 MG tablet   Naphazoline-Pheniramine (OPCON-A ) 0.027-0.315 % SOLN   naproxen sodium (ALEVE) 220 MG tablet   OnabotulinumtoxinA (BOTOX IM)   OZEMPIC, 0.25 OR 0.5 MG/DOSE, 2 MG/3ML SOPN   PRESCRIPTION MEDICATION   rizatriptan  (MAXALT -MLT) 10 MG disintegrating tablet   Tetrahydrozoline HCl (VISINE OP)   thyroid  (ARMOUR) 30 MG tablet   tiZANidine  (ZANAFLEX ) 4 MG tablet   UBRELVY  50 MG TABS   Vilazodone  HCl 20 MG TABS   No current facility-administered medications for this encounter.    ondansetron  (ZOFRAN ) 4 mg in sodium chloride  0.9 % 50 mL IVPB    Burnard CHRISTELLA Senna, PA-C [facility] Phone [phone] 10/29/2023 2:27 PM

## 2023-10-30 ENCOUNTER — Ambulatory Visit: Admitting: Physical Therapy

## 2023-10-30 ENCOUNTER — Encounter: Payer: Self-pay | Admitting: Physical Therapy

## 2023-10-30 DIAGNOSIS — M25661 Stiffness of right knee, not elsewhere classified: Secondary | ICD-10-CM

## 2023-10-30 DIAGNOSIS — R2689 Other abnormalities of gait and mobility: Secondary | ICD-10-CM

## 2023-10-30 DIAGNOSIS — Z96651 Presence of right artificial knee joint: Secondary | ICD-10-CM

## 2023-10-30 DIAGNOSIS — G8929 Other chronic pain: Secondary | ICD-10-CM

## 2023-10-30 DIAGNOSIS — R600 Localized edema: Secondary | ICD-10-CM

## 2023-10-30 DIAGNOSIS — M25561 Pain in right knee: Secondary | ICD-10-CM

## 2023-10-30 DIAGNOSIS — M549 Dorsalgia, unspecified: Secondary | ICD-10-CM | POA: Diagnosis not present

## 2023-10-30 DIAGNOSIS — M6281 Muscle weakness (generalized): Secondary | ICD-10-CM

## 2023-10-30 NOTE — Therapy (Signed)
 OUTPATIENT PHYSICAL THERAPY LOWER EXTREMITY TREATMENT    Patient Name: [name] MRN: [id number] DOB:11/14/1960, 63 y.o., female Today's Date: 10/30/2023  END OF SESSION:  PT End of Session - 10/30/23 1530     Visit Number 17    Date for Recertification  11/21/23    Authorization Type [employer]    PT Start Time 1530    PT Stop Time 1615    PT Time Calculation (min) 45 min    Activity Tolerance Patient tolerated treatment well    Behavior During Therapy WFL for tasks assessed/performed            Past Medical History:  Diagnosis Date   Anxiety    Arthritis    Back pain of lumbar region with sciatica    Cancer (HCC)    breast   Depression    Family history of anesthesia complication    grandfather died under anesthesia 70's- had heart issues   H/O carpal tunnel repair 2014   Headache(784.0)    Hyperlipidemia    Hypertension    Hypothyroidism    Medial meniscus tear    PONV (postoperative nausea and vomiting)    Pre-diabetes    Sleep apnea    cpap since 10   Past Surgical History:  Procedure Laterality Date   BILATERAL TOTAL MASTECTOMY WITH AXILLARY LYMPH NODE DISSECTION     BREAST RECONSTRUCTION Left 07/17/2022   for encapsulation,  done at [facility]   CERVICAL DISC ARTHROPLASTY N/A 03/19/2013   Procedure: CERVICAL FIVE TO SIX, CERVICAL SIX TO SEVEN CERVICAL ANTERIOR DISC ARTHROPLASTY;  Surgeon: Victory Gens, MD;  Location: [facility];  Service: Neurosurgery;  Laterality: N/A;  C56 C67 artificial disc replacement   CESAREAN SECTION     COLONOSCOPY     DILATATION & CURETTAGE/HYSTEROSCOPY WITH TRUECLEAR N/A 11/25/2013   Procedure: DILATATION & CURETTAGE/HYSTEROSCOPY WITH TRUCLEAR;  Surgeon: Charlie CHRISTELLA Croak, MD;  Location: [facility];  Service: Gynecology;  Laterality: N/A;   ECTOPIC PREGNANCY SURGERY     GALLBLADDER SURGERY     MOUTH SURGERY     child- dog bite   SPINAL FUSION     2023   STERIOD INJECTION Left 09/09/2022   Procedure: LEFT KNEE STEROID INJECTION;   Surgeon: Melodi Lerner, MD;  Location: [facility];  Service: Orthopedics;  Laterality: Left;  left knee injection 0928   TONSILLECTOMY     TOTAL KNEE ARTHROPLASTY Right 09/09/2022   Procedure: TOTAL KNEE ARTHROPLASTY;  Surgeon: Melodi Lerner, MD;  Location: [facility];  Service: Orthopedics;  Laterality: Right;   TUBAL LIGATION     Patient Active Problem List   Diagnosis Date Noted   OA (osteoarthritis) of knee 09/09/2022   Exertional dyspnea 04/17/2022   Elevated coronary artery calcium  score 04/17/2022   Spondylolisthesis at L4-L5 level 12/27/2021   Ductal carcinoma in situ (DCIS) of left breast 01/19/2018   Generalized anxiety disorder 11/07/2017   Depression 11/07/2017   Insomnia 11/07/2017   Cervical spondylosis 03/19/2013    PCP: Norleen Jungling  REFERRING PROVIDER: Lerner Lawn  REFERRING DIAG: R TKA 09/09/22  THERAPY DIAG:  Chronic bilateral back pain, unspecified back location  S/P total knee arthroplasty, right  Stiffness of right knee, not elsewhere classified  Muscle weakness (generalized)  Acute pain of right knee  Localized edema  Other abnormalities of gait and mobility  Rationale for Evaluation and Treatment: Rehabilitation  ONSET DATE: 09/09/22  SUBJECTIVE:   SUBJECTIVE STATEMENT :  Patient will have left TKA on 11/10/23 Reports  doing okay, reports that she is a little nervous  Patient had right breast revision on 07/17/23 due to scar encapsulation.  She is returning here due to the back pain.  Reports that she is doing a little better due to being less active with this surgery  Saw Dr. Colon last Friday, has a bulging disc on the right at L2, recommended continuation of PT.  Will have injection in back May 20th R TKA 8/19 L knee steroid injection 09/09/22  PAIN:  Are you having pain? Yes: NPRS scale: 6/10 Pain location: right low back, buttock and reports around to the front of the thigh Pain description: sharp really hurts, ache Aggravating factors:  difficulty sleeping, difficulty walking and standing and sitting pain up to 9/10 trying to clean the house recently Relieving factors: heat, Aleve , tramadol  and methacarbomol, the PT treatment was helping  PRECAUTIONS: None no baths, no pushing up, no weights with arms  RED FLAGS: None   WEIGHT BEARING RESTRICTIONS: No  FALLS:  Has patient fallen in last 6 months? No  LIVING ENVIRONMENT: Lives with: lives with their family and lives with their spouse Lives in: House/apartment Stairs: Yes: External: 3 steps; none Has following equipment at home: Single point cane and Walker - 2 wheeled  OCCUPATION: Not working  PLOF: Independent  PATIENT GOALS: less back pain  NEXT MD VISIT: none for Dr. Colon  OBJECTIVE:   PATIENT SURVEYS:    COGNITION: Overall cognitive status: Within functional limits for tasks assessed      EDEMA:    POSTURE: rounded shoulders and flexed trunk   PALPATION: Very tender in the right lumbar L2-5, some tenderness in the buttock  LOWER EXTREMITY ROM:  Active ROM Right eval   Right 09/26/22 Right 10/02/22  11/01/22 12/09/22  Hip flexion          Hip extension          Hip abduction          Hip adduction          Hip internal rotation          Hip external rotation          Knee flexion 110    82* AROM seated per pt request  78* self AAROM, 76* AROM  PROM 87 AROM 81 PROM 90 AROM 83 AROM 101 PROM 105  Knee extension 0    -16* supine heel prop with quad set  AROM 25 PROM 16 AROM 14 AROM 5 PROM 0  Ankle dorsiflexion          Ankle plantarflexion          Ankle inversion          Ankle eversion           (Blank rows = not tested)  LOWER EXTREMITY MMT:  MMT Right eval Left eval  Hip flexion 3+   Hip extension    Hip abduction    Hip adduction    Hip internal rotation    Hip external rotation    Knee flexion 4+   Knee extension 4+   Ankle dorsiflexion    Ankle plantarflexion    Ankle inversion    Ankle eversion     (Blank rows  = not tested)  LUMBAR ROM:  decreased 25% flexion, decreased 75% for extension and side bending, pain was worse with extension and right side bending. Hooklying is less painful that lying flat on back Mild tightness with pain during HS  stretch, tight and painful piriformis bilaterally worse on the right , very weak core  FUNCTIONAL TESTS:  TUG 18 seconds no device  GAIT: No assistive device, slow mild antalgic on the right    TODAY'S TREATMENT:                                                                                                                              DATE:  10/30/23 Bike level 4 x 5 minutes Gait outside good pace, no rest slight limp in the left Seated rows 15# Lats 15# Slant board stretch 20# HS curls 5# extension PAssive stretch LE's Manual sheet traction lumbar STM to the right low back  10/27/23 Gait outside brisk pace around the back building Bike level  x 6 minutes seat position 6 Had her demo HEP for post op with minimal cues from me, she did have questions LEg press able to go to position #1 without weight and do position # 4 with 20# Manual sheet traction STM to the low back mms  10/23/23 Gait outside walking around the building Bike level 5 x 5 minutes Reviewed and performed HEP for post op, she had questions and needed some guidance.  Verbal and tactile cues needed STM to the right low back Manual lumbar traction  Passive LE stretches  10/20/23 Bike level 4 x 6 minutes Tmill pushes Reviewed and PT performed and demo of all the HEP from the pre surgery visit she had last week, went over the RICE and walking every hour, cues for quad activation and to decrease stress on back Manual pelvic sheet traction Passive LE stretches  10/16/23 Bike level 5 x 6 minutes Gait outside also back building 2 flights of stairs up and down and then up again Leg curls 25# 2x10 5# leg extension 20# lat pulls Feet on ball K2C, rotation, bridge, isometric  abs Reviewed MD orders for TKA 11/10/23, reviewed the HEP they gave her  will need to go over this again  10/14/23 Gait outside around the back building brisk pace some SOB Bike LEvel 5 x 6 minutes Seated row 20# Lats 20# Passive LE and trunk stretches STM with the T-gun to the bilateral L/S area Manual sheet traction lumbar  10/08/23 Bike level 5 x 6 minutes Feet on ball K2C, rotation, small bridge, isometric abs Passive stretch LE's STM to the left low back and buttock Manual sheet traction  10/06/23 Bike level 5 x 6 minutes Gait around the building no rest brisk pace Seated rows 20# LAts 20# HS curls 25# PAssive stretch LE's Manual sheet traction STM with the Tgun to the left low back and buttock  10/02/23 Gait outside around the building, down stairs 2x and up 1x no rest good pace Bike level 5 x 6 minutes 5# straight arm pulls  5# LEg extension 20# HS curls Feet on ball K2C, rotation, posterior activation, isometric abs Passive stretch HS STM to the left lumbar and  buttock  09/29/23 Gait outside around the building 1.5 laps and then in the building and up and down the stairs Bike level 4 x 6 minutes 5# straight arm pulls with cues for posture and core 10# lats Feet on ball K2C, rotation, bridge, isometric abs STM with the Tgun to the bilateral low back area and into the HS Manual sheet traction gentle  09/25/23 Bike level 5 x 6 minutes Gait outside brisk pace 5# straight arm pulls 20# leg press 10# lats Green tband clamshells Ball b/n knees squeeze STM with the Tgun Manual sheet traction   09/16/23 Gait around the building good pace Bike level 5 x 6 minutes Red tband Row Red tband extension Feet on ball K2C, rotation, bridge, gentle isometric abs Manual sheet traction STM to the low back and buttocks  09/04/23 Bike level 4 x 6 minutes LEg press 20# Stairs step over step Gait outside  Manual sheet traction STM to the right low back and hip Feet on  ball k2c, rotation, bridge, PPT  09/01/23 Bike level 4 x 6 minutes Gait outside around the small parking [employer] step over step 2 full flights  Feet on ball K2C, rotation, small bridge, PPT's PAssive stretch LE's Manual sheet traction Discussion about lifting grandchild to protect breast and back   08/28/23 Bike level 4 x 8 minutes Leg curls 25# Leg extension 5# Feet on ball K2C, rotation, bridges, PPT Green tband clamshells Ball b/n knees small bridge STM to the buttock and low back Manual sheet traction  08/25/23 Bike level 4 x 8 minutes Walk outside good pace, SOB and a limp on the left Feet on ball K2C, rotation, bridge, pelvic tilts needed a lot of verbal and tactile cues to get PPT Passive stretch LE's, calves are very tight calves, went over the stretch for home Manual sheet pelvic traction  08/21/23 Evaluation  PATIENT EDUCATION:  Education details: POC and HEP Person educated: Patient Education method: Explanation Education comprehension: verbalized understanding  HOME EXERCISE PROGRAM: Access Code: [facility] Access Code: [email or web address] URL: [email or web address] Date: 08/21/23 Prepared by: Ozell Mainland  Exercises - Hooklying Single Knee to Chest Stretch  - 2 x daily - 7 x weekly - 1 sets - 10 reps - 5 hold - Supine Double Knee to Chest  - 2 x daily - 7 x weekly - 1 sets - 10 reps - 5 hold - Supine Lower Trunk Rotation with PLB  - 2 x daily - 7 x weekly - 1 sets - 10 reps - 5 hold - Supine Posterior Pelvic Tilt  - 2 x daily - 7 x weekly - 1 sets - 10 reps - 3 hold  ASSESSMENT:  CLINICAL IMPRESSION:   Patient is scheduled for left TKA on 11/10/23, Continue to work on her post op exercises so she knows what to do, as she was really behind last time and struggled, added some exercises for the legs and did a little more stretching of the left leg  Patient has a right disc herniation at L2-3.   She had the right breast surgery June 26, has some  limitations with lifting for this.  She is still having some significant low back pain.  Very tender in the right L2 area.  Fair strength.  Has left knee pain and has pain up to 8/10 with walking has a left TKA scheduled for 11/10/23.  She is going to have a grandchild due August 12. OBJECTIVE IMPAIRMENTS: Abnormal gait, cardiopulmonary status limiting activity, decreased  activity tolerance, decreased endurance, decreased mobility, difficulty walking, decreased ROM, decreased strength, increased muscle spasms, impaired flexibility, improper body mechanics, postural dysfunction, and pain.   REHAB POTENTIAL: Good  CLINICAL DECISION MAKING: Stable/uncomplicated  EVALUATION COMPLEXITY: Low   GOALS: Goals reviewed with patient? Yes  SHORT TERM GOALS: Target date: 09/21/23  Patient will be independent with initial HEP. Goal status:  met 10/02/23   LONG TERM GOALS: Target date: 01/21/24  Patient will be independent with advanced/ongoing HEP to improve outcomes and carryover.  Goal status: initial  2.  Patient will report at least 75% improvement in R knee pain to improve QOL. And back pain Baseline: 8/10 Goal status:progressing 10/16/23  3.  Patient will increase lumbar ROM 25% Baseline:as noted above Initial.  Met 09/29/23  4.  Patient will demonstrate improved functional LE strength as demonstrated by 5/5. Baseline: see above Goal status: met 10/23/23  5.  Patient will be able to ambulate 1000' without AD and normal gait pattern without increased pain to access community.  Baseline:  Goal status:met 09/16/23  6.  Decrease lumbar pain 50%  Goal status: met 10/14/23  7.  Report able to put shoes and socks on without pain > 4/10  Progressing 10/08/23  PLAN:  PT FREQUENCY: 2x/week  PT DURATION: 6 months  PLANNED INTERVENTIONS: Therapeutic exercises, Therapeutic activity, Neuromuscular re-education, Balance training, Gait training, Patient/Family education, Self Care, Joint  mobilization, Stair training, Electrical stimulation, Cryotherapy, Moist heat, scar mobilization, Vasopneumatic device, and Manual therapy  PLAN FOR NEXT SESSION:  Next visit have her demo HEP and after surgery care verbalization, measure the left knee AROM  Patient Details  Name: [name] MRN: [id number] Date of Birth: 14-Jul-1960 Referring Provider:  Colon Shove, MD  Encounter Date: 10/30/2023   OBADIAH OZELL ORN, PT 10/30/2023, 3:32 PM

## 2023-11-03 ENCOUNTER — Ambulatory Visit: Admitting: Physical Therapy

## 2023-11-03 ENCOUNTER — Encounter: Payer: Self-pay | Admitting: Physical Therapy

## 2023-11-03 DIAGNOSIS — M25561 Pain in right knee: Secondary | ICD-10-CM

## 2023-11-03 DIAGNOSIS — Z96651 Presence of right artificial knee joint: Secondary | ICD-10-CM

## 2023-11-03 DIAGNOSIS — M6281 Muscle weakness (generalized): Secondary | ICD-10-CM

## 2023-11-03 DIAGNOSIS — R2689 Other abnormalities of gait and mobility: Secondary | ICD-10-CM

## 2023-11-03 DIAGNOSIS — M25661 Stiffness of right knee, not elsewhere classified: Secondary | ICD-10-CM

## 2023-11-03 DIAGNOSIS — G8929 Other chronic pain: Secondary | ICD-10-CM

## 2023-11-03 DIAGNOSIS — M549 Dorsalgia, unspecified: Secondary | ICD-10-CM | POA: Diagnosis not present

## 2023-11-03 DIAGNOSIS — R600 Localized edema: Secondary | ICD-10-CM

## 2023-11-03 NOTE — Therapy (Signed)
 OUTPATIENT PHYSICAL THERAPY LOWER EXTREMITY TREATMENT    Patient Name: [name] MRN: [id number] DOB:June 21, 1960, 63 y.o., female Today's Date: 11/03/2023  END OF SESSION:  PT End of Session - 11/03/23 1535     Visit Number 18    Date for Recertification  11/21/23    Authorization Type [employer]    PT Start Time 1530    PT Stop Time 1615    PT Time Calculation (min) 45 min    Activity Tolerance Patient tolerated treatment well    Behavior During Therapy WFL for tasks assessed/performed            Past Medical History:  Diagnosis Date   Anxiety    Arthritis    Back pain of lumbar region with sciatica    Cancer (HCC)    breast   Depression    Family history of anesthesia complication    grandfather died under anesthesia 70's- had heart issues   H/O carpal tunnel repair 2014   Headache(784.0)    Hyperlipidemia    Hypertension    Hypothyroidism    Medial meniscus tear    PONV (postoperative nausea and vomiting)    Pre-diabetes    Sleep apnea    cpap since 10   Past Surgical History:  Procedure Laterality Date   BILATERAL TOTAL MASTECTOMY WITH AXILLARY LYMPH NODE DISSECTION     BREAST RECONSTRUCTION Left 07/17/2022   for encapsulation,  done at [facility]   CERVICAL DISC ARTHROPLASTY N/A 03/19/2013   Procedure: CERVICAL FIVE TO SIX, CERVICAL SIX TO SEVEN CERVICAL ANTERIOR DISC ARTHROPLASTY;  Surgeon: Victory Gens, MD;  Location: [facility];  Service: Neurosurgery;  Laterality: N/A;  C56 C67 artificial disc replacement   CESAREAN SECTION     COLONOSCOPY     DILATATION & CURETTAGE/HYSTEROSCOPY WITH TRUECLEAR N/A 11/25/2013   Procedure: DILATATION & CURETTAGE/HYSTEROSCOPY WITH TRUCLEAR;  Surgeon: Charlie CHRISTELLA Croak, MD;  Location: [facility];  Service: Gynecology;  Laterality: N/A;   ECTOPIC PREGNANCY SURGERY     GALLBLADDER SURGERY     MOUTH SURGERY     child- dog bite   SPINAL FUSION     2023   STERIOD INJECTION Left 09/09/2022   Procedure: LEFT KNEE STEROID  INJECTION;  Surgeon: Melodi Lerner, MD;  Location: [facility];  Service: Orthopedics;  Laterality: Left;  left knee injection 0928   TONSILLECTOMY     TOTAL KNEE ARTHROPLASTY Right 09/09/2022   Procedure: TOTAL KNEE ARTHROPLASTY;  Surgeon: Melodi Lerner, MD;  Location: [facility];  Service: Orthopedics;  Laterality: Right;   TUBAL LIGATION     Patient Active Problem List   Diagnosis Date Noted   OA (osteoarthritis) of knee 09/09/2022   Exertional dyspnea 04/17/2022   Elevated coronary artery calcium  score 04/17/2022   Spondylolisthesis at L4-L5 level 12/27/2021   Ductal carcinoma in situ (DCIS) of left breast 01/19/2018   Generalized anxiety disorder 11/07/2017   Depression 11/07/2017   Insomnia 11/07/2017   Cervical spondylosis 03/19/2013    PCP: Norleen Jungling  REFERRING PROVIDER: Lerner Lawn  REFERRING DIAG: R TKA 09/09/22  THERAPY DIAG:  Chronic bilateral back pain, unspecified back location  S/P total knee arthroplasty, right  Stiffness of right knee, not elsewhere classified  Muscle weakness (generalized)  Acute pain of right knee  Localized edema  Other abnormalities of gait and mobility  Rationale for Evaluation and Treatment: Rehabilitation  ONSET DATE: 09/09/22  SUBJECTIVE:   SUBJECTIVE STATEMENT :  Patient will have left TKA on 11/10/23 Reports  doing okay,questions regarding after surgery  Patient had right breast revision on 07/17/23 due to scar encapsulation.  She is returning here due to the back pain.  Reports that she is doing a little better due to being less active with this surgery  Saw Dr. Colon last Friday, has a bulging disc on the right at L2, recommended continuation of PT.  Will have injection in back May 20th R TKA 8/19 L knee steroid injection 09/09/22  PAIN:  Are you having pain? Yes: NPRS scale: 6/10 Pain location: right low back, buttock and reports around to the front of the thigh Pain description: sharp really hurts, ache Aggravating  factors: difficulty sleeping, difficulty walking and standing and sitting pain up to 9/10 trying to clean the house recently Relieving factors: heat, Aleve , tramadol  and methacarbomol, the PT treatment was helping  PRECAUTIONS: None no baths, no pushing up, no weights with arms  RED FLAGS: None   WEIGHT BEARING RESTRICTIONS: No  FALLS:  Has patient fallen in last 6 months? No  LIVING ENVIRONMENT: Lives with: lives with their family and lives with their spouse Lives in: House/apartment Stairs: Yes: External: 3 steps; none Has following equipment at home: Single point cane and Walker - 2 wheeled  OCCUPATION: Not working  PLOF: Independent  PATIENT GOALS: less back pain  NEXT MD VISIT: none for Dr. Colon  OBJECTIVE:   PATIENT SURVEYS:    COGNITION: Overall cognitive status: Within functional limits for tasks assessed      EDEMA:    POSTURE: rounded shoulders and flexed trunk   PALPATION: Very tender in the right lumbar L2-5, some tenderness in the buttock  LOWER EXTREMITY ROM:  Active ROM Right eval   Right 09/26/22 Right 10/02/22  11/01/22 12/09/22  Hip flexion          Hip extension          Hip abduction          Hip adduction          Hip internal rotation          Hip external rotation          Knee flexion 110    82* AROM seated per pt request  78* self AAROM, 76* AROM  PROM 87 AROM 81 PROM 90 AROM 83 AROM 101 PROM 105  Knee extension 0    -16* supine heel prop with quad set  AROM 25 PROM 16 AROM 14 AROM 5 PROM 0  Ankle dorsiflexion          Ankle plantarflexion          Ankle inversion          Ankle eversion           (Blank rows = not tested)  LOWER EXTREMITY MMT:  MMT Right eval Left eval  Hip flexion 3+   Hip extension    Hip abduction    Hip adduction    Hip internal rotation    Hip external rotation    Knee flexion 4+   Knee extension 4+   Ankle dorsiflexion    Ankle plantarflexion    Ankle inversion    Ankle eversion      (Blank rows = not tested)  LUMBAR ROM:  decreased 25% flexion, decreased 75% for extension and side bending, pain was worse with extension and right side bending. Hooklying is less painful that lying flat on back Mild tightness with pain during HS stretch, tight and painful  piriformis bilaterally worse on the right , very weak core  FUNCTIONAL TESTS:  TUG 18 seconds no device  GAIT: No assistive device, slow mild antalgic on the right    TODAY'S TREATMENT:                                                                                                                              DATE:  11/03/23 Bike level 4 seat position #5 x 6 minutes Gait outside to back building up and down stairs step over step Passive stretch bilateral knees, LE's STM to the low back and into the buttocks Answered questions and went over what to expect, use of walker, up and moving every hour, stretching.  Ice, elevation , nothing behind the knee Leg curls 20# 2x15 Leg press 20# 2x15 Manual sheet traction STM to the low back and into the buttocks  10/30/23 Bike level 4 x 5 minutes Gait outside good pace, no rest slight limp in the left Seated rows 15# Lats 15# Slant board stretch 20# HS curls 5# extension PAssive stretch LE's Manual sheet traction lumbar STM to the right low back  10/27/23 Gait outside brisk pace around the back building Bike level  x 6 minutes seat position 6 Had her demo HEP for post op with minimal cues from me, she did have questions LEg press able to go to position #1 without weight and do position # 4 with 20# Manual sheet traction STM to the low back mms  10/23/23 Gait outside walking around the building Bike level 5 x 5 minutes Reviewed and performed HEP for post op, she had questions and needed some guidance.  Verbal and tactile cues needed STM to the right low back Manual lumbar traction  Passive LE stretches  10/20/23 Bike level 4 x 6 minutes Tmill pushes Reviewed  and PT performed and demo of all the HEP from the pre surgery visit she had last week, went over the RICE and walking every hour, cues for quad activation and to decrease stress on back Manual pelvic sheet traction Passive LE stretches  10/16/23 Bike level 5 x 6 minutes Gait outside also back building 2 flights of stairs up and down and then up again Leg curls 25# 2x10 5# leg extension 20# lat pulls Feet on ball K2C, rotation, bridge, isometric abs Reviewed MD orders for TKA 11/10/23, reviewed the HEP they gave her  will need to go over this again  10/14/23 Gait outside around the back building brisk pace some SOB Bike LEvel 5 x 6 minutes Seated row 20# Lats 20# Passive LE and trunk stretches STM with the T-gun to the bilateral L/S area Manual sheet traction lumbar  10/08/23 Bike level 5 x 6 minutes Feet on ball K2C, rotation, small bridge, isometric abs Passive stretch LE's STM to the left low back and buttock Manual sheet traction  10/06/23 Bike level 5 x 6 minutes Gait around the building no rest brisk  pace Seated rows 20# LAts 20# HS curls 25# PAssive stretch LE's Manual sheet traction STM with the Tgun to the left low back and buttock  10/02/23 Gait outside around the building, down stairs 2x and up 1x no rest good pace Bike level 5 x 6 minutes 5# straight arm pulls  5# LEg extension 20# HS curls Feet on ball K2C, rotation, posterior activation, isometric abs Passive stretch HS STM to the left lumbar and buttock  09/29/23 Gait outside around the building 1.5 laps and then in the building and up and down the stairs Bike level 4 x 6 minutes 5# straight arm pulls with cues for posture and core 10# lats Feet on ball K2C, rotation, bridge, isometric abs STM with the Tgun to the bilateral low back area and into the HS Manual sheet traction gentle  09/25/23 Bike level 5 x 6 minutes Gait outside brisk pace 5# straight arm pulls 20# leg press 10# lats Green tband  clamshells Ball b/n knees squeeze STM with the Tgun Manual sheet traction   09/16/23 Gait around the building good pace Bike level 5 x 6 minutes Red tband Row Red tband extension Feet on ball K2C, rotation, bridge, gentle isometric abs Manual sheet traction STM to the low back and buttocks  09/04/23 Bike level 4 x 6 minutes LEg press 20# Stairs step over step Gait outside  Manual sheet traction STM to the right low back and hip Feet on ball k2c, rotation, bridge, PPT  09/01/23 Bike level 4 x 6 minutes Gait outside around the small parking [employer] step over step 2 full flights  Feet on ball K2C, rotation, small bridge, PPT's PAssive stretch LE's Manual sheet traction Discussion about lifting grandchild to protect breast and back   08/28/23 Bike level 4 x 8 minutes Leg curls 25# Leg extension 5# Feet on ball K2C, rotation, bridges, PPT Green tband clamshells Ball b/n knees small bridge STM to the buttock and low back Manual sheet traction  08/25/23 Bike level 4 x 8 minutes Walk outside good pace, SOB and a limp on the left Feet on ball K2C, rotation, bridge, pelvic tilts needed a lot of verbal and tactile cues to get PPT Passive stretch LE's, calves are very tight calves, went over the stretch for home Manual sheet pelvic traction  08/21/23 Evaluation  PATIENT EDUCATION:  Education details: POC and HEP Person educated: Patient Education method: Explanation Education comprehension: verbalized understanding  HOME EXERCISE PROGRAM: Access Code: [facility] Access Code: [email or web address] URL: [email or web address] Date: 08/21/23 Prepared by: Ozell Mainland  Exercises - Hooklying Single Knee to Chest Stretch  - 2 x daily - 7 x weekly - 1 sets - 10 reps - 5 hold - Supine Double Knee to Chest  - 2 x daily - 7 x weekly - 1 sets - 10 reps - 5 hold - Supine Lower Trunk Rotation with PLB  - 2 x daily - 7 x weekly - 1 sets - 10 reps - 5 hold - Supine  Posterior Pelvic Tilt  - 2 x daily - 7 x weekly - 1 sets - 10 reps - 3 hold  ASSESSMENT:  CLINICAL IMPRESSION:   Patient is scheduled for left TKA on 11/10/23, Continue to work on her post op exercises so she knows what to do, as she was really behind last time and struggled, assured that she knows to be able to put full weight on the leg, how to use walker, to move every hour, to do the  flexion and extension stretches  Patient has a right disc herniation at L2-3.   She had the right breast surgery June 26, has some limitations with lifting for this.  She is still having some significant low back pain.  Very tender in the right L2 area.  Fair strength.  Has left knee pain and has pain up to 8/10 with walking has a left TKA scheduled for 11/10/23.  She is going to have a grandchild due August 12. OBJECTIVE IMPAIRMENTS: Abnormal gait, cardiopulmonary status limiting activity, decreased activity tolerance, decreased endurance, decreased mobility, difficulty walking, decreased ROM, decreased strength, increased muscle spasms, impaired flexibility, improper body mechanics, postural dysfunction, and pain.   REHAB POTENTIAL: Good  CLINICAL DECISION MAKING: Stable/uncomplicated  EVALUATION COMPLEXITY: Low   GOALS: Goals reviewed with patient? Yes  SHORT TERM GOALS: Target date: 09/21/23  Patient will be independent with initial HEP. Goal status:  met 10/02/23   LONG TERM GOALS: Target date: 01/21/24  Patient will be independent with advanced/ongoing HEP to improve outcomes and carryover.  Goal status: initial  2.  Patient will report at least 75% improvement in R knee pain to improve QOL. And back pain Baseline: 8/10 Goal status:progressing 10/16/23  3.  Patient will increase lumbar ROM 25% Baseline:as noted above Initial.  Met 09/29/23  4.  Patient will demonstrate improved functional LE strength as demonstrated by 5/5. Baseline: see above Goal status: met 10/23/23  5.  Patient will be  able to ambulate 1000' without AD and normal gait pattern without increased pain to access community.  Baseline:  Goal status:met 09/16/23  6.  Decrease lumbar pain 50%  Goal status: met 10/14/23  7.  Report able to put shoes and socks on without pain > 4/10  Progressing 10/08/23  PLAN:  PT FREQUENCY: 2x/week  PT DURATION: 6 months  PLANNED INTERVENTIONS: Therapeutic exercises, Therapeutic activity, Neuromuscular re-education, Balance training, Gait training, Patient/Family education, Self Care, Joint mobilization, Stair training, Electrical stimulation, Cryotherapy, Moist heat, scar mobilization, Vasopneumatic device, and Manual therapy  PLAN FOR NEXT SESSION:  Next visit have her demo HEP and after surgery care verbalization, measure the left knee AROM  Patient Details  Name: MARGUETTA WINDISH MRN: [id number] Date of Birth: 24-Nov-1960 Referring Provider:  Colon Shove, MD  Encounter Date: 11/03/2023   OBADIAH OZELL ORN, PT 11/03/2023, 3:35 PM

## 2023-11-04 ENCOUNTER — Telehealth (INDEPENDENT_AMBULATORY_CARE_PROVIDER_SITE_OTHER): Payer: No Typology Code available for payment source | Admitting: Adult Health

## 2023-11-04 DIAGNOSIS — G4733 Obstructive sleep apnea (adult) (pediatric): Secondary | ICD-10-CM | POA: Diagnosis not present

## 2023-11-04 NOTE — Patient Instructions (Signed)
 Continue using CPAP nightly and greater than 4 hours each night If your symptoms worsen or you develop new symptoms please let us  know.

## 2023-11-04 NOTE — Progress Notes (Addendum)
 Virtual Visit via Video Note  I connected with [name] on 11/04/23 at 11:30 AM EDT by a video enabled telemedicine application located remotely at [facility] and verified that I am speaking with the correct person using two identifiers who was located at their own home.  Verified that she is in [place]    I discussed the limitations of evaluation and management by telemedicine and the availability of in person appointments. The patient expressed understanding and agreed to proceed.  PATIENT: [name] DOB: Nov 25, 1960  REASON FOR VISIT: follow up HISTORY FROM: patient PRIMARY NEUROLOGIST: Dr. Buck  HISTORY OF PRESENT ILLNESS: Today 11/04/23:  [name] is a 63 y.o. female with a history of OSA on CPAP . Returns today for follow-up.  She reports that the CPAP is working well for her.  She denies any new issues.  She currently wears the DreamWear mask.  Her download is below      10/31/22: [name] is a 63 y.o. female with a history of OSA on CPAP. Returns today for follow-up.  Reports that new CPAP machine is working well.  She denies any new issues.  Her download is below       REVIEW OF SYSTEMS: Out of a complete 14 system review of symptoms, the patient complains only of the following symptoms, and all other reviewed systems are negative.  FSS ESS  ALLERGIES: Allergies  Allergen Reactions   American Cockroach Itching   Cat Dander Itching and Other (See Comments)    Nasal congestion,   Effexor [Venlafaxine Hcl]     Weight loss and cant sleep   Effexor [Venlafaxine] Other (See Comments)    Other Reaction(s): anxiety/ extreme weight loss   Linzess [Linaclotide] Diarrhea   Molds & Smuts Other (See Comments)    HOME MEDICATIONS: Outpatient Medications Prior to Visit  Medication Sig Dispense Refill   ALPRAZolam  (XANAX ) 0.5 MG tablet Take 1/2-1 tablet three times daily as needed for anxiety or insomnia 75 tablet 2    aspirin  EC 81 MG tablet Take 81 mg by mouth daily. Swallow whole.     atorvastatin  (LIPITOR) 20 MG tablet TAKE 1 TABLET BY MOUTH EVERY DAY 90 tablet 0   Azelastine HCl 137 MCG/SPRAY SOLN Place 2 sprays into both nostrils 2 (two) times daily.     doxazosin  (CARDURA ) 4 MG tablet Take 1 tablet (4 mg total) by mouth at bedtime. 90 tablet 1   EPINEPHrine  0.3 mg/0.3 mL IJ SOAJ injection Inject 0.3 mg into the muscle as needed for anaphylaxis.     famotidine -calcium  carbonate-magnesium  hydroxide (PEPCID  COMPLETE) 10-800-165 MG chewable tablet Chew 1 tablet by mouth daily as needed (with aleve for pain.).     gabapentin  (NEURONTIN ) 100 MG capsule Take 1 capsule (100 mg total) by mouth 2 (two) times daily as needed (anxiety). (Patient taking differently: Take 200 mg by mouth in the morning.) 180 capsule 1   gabapentin  (NEURONTIN ) 300 MG capsule Take 1 capsule (300 mg total) by mouth at bedtime. 90 capsule 1   Galcanezumab-gnlm 120 MG/ML SOAJ Inject 120 mg into the skin every 30 (thirty) days.     GOODSENSE PSYLLIUM FIBER PO Take 10 mLs by mouth daily. 11.6 grams of fiber     hydrochlorothiazide  (MICROZIDE ) 12.5 MG capsule Take 12.5 mg by mouth in the morning.     JARDIANCE  10 MG TABS tablet Take 10 mg by mouth in the morning.     Lemborexant  (DAYVIGO ) 10 MG  TABS Take 1 tablet (10 mg total) by mouth at bedtime. 90 tablet 1   meloxicam  (MOBIC ) 7.5 MG tablet Take 7.5 mg by mouth daily as needed for pain (Back pain).     metFORMIN  (GLUCOPHAGE ) 500 MG tablet Take 500 mg by mouth daily with supper.     methocarbamol  (ROBAXIN ) 500 MG tablet Take 1 tablet (500 mg total) by mouth every 6 (six) hours as needed for muscle spasms. 40 tablet 0   mometasone (ELOCON) 0.1 % lotion 5 drops daily. Instill 5 drops into right ear daily     montelukast (SINGULAIR) 10 MG tablet Take 10 mg by mouth every other day.     Naphazoline-Pheniramine (OPCON-A ) 0.027-0.315 % SOLN Place 1-2 drops into both eyes 3 (three) times daily as  needed (irritated/allergy eyes).     naproxen sodium (ALEVE) 220 MG tablet Take 220 mg by mouth daily as needed (Pain).     OnabotulinumtoxinA (BOTOX IM) Inject 1 Dose into the muscle every 3 (three) months. FOR MIGRAINES     OZEMPIC, 0.25 OR 0.5 MG/DOSE, 2 MG/3ML SOPN Inject 0.5 mg as directed once a week.     PRESCRIPTION MEDICATION once a week. Allergy injection     rizatriptan  (MAXALT -MLT) 10 MG disintegrating tablet Take 10 mg by mouth as needed for migraine.      Tetrahydrozoline HCl (VISINE OP) Place 1 drop into both eyes daily as needed (irritation).     thyroid  (ARMOUR) 30 MG tablet Take 30 mg by mouth daily before breakfast.      tiZANidine  (ZANAFLEX ) 4 MG tablet Take 4 mg by mouth daily as needed for muscle spasms (Migraine).     UBRELVY  50 MG TABS Take 50 mg by mouth daily as needed (migraine).     Vilazodone  HCl 20 MG TABS Take 1.5 tablets (30 mg total) by mouth daily with breakfast. 135 tablet 1   Facility-Administered Medications Prior to Visit  Medication Dose Route Frequency Provider Last Rate Last Admin   ondansetron  (ZOFRAN ) 4 mg in sodium chloride  0.9 % 50 mL IVPB  4 mg Intravenous Q6H PRN Colon Shove, MD        PAST MEDICAL HISTORY: Past Medical History:  Diagnosis Date   Anxiety    Arthritis    Back pain of lumbar region with sciatica    Cancer (HCC)    breast   Depression    Family history of anesthesia complication    grandfather died under anesthesia 70's- had heart issues   H/O carpal tunnel repair 2014   Headache(784.0)    Hyperlipidemia    Hypertension    Hypothyroidism    Medial meniscus tear    PONV (postoperative nausea and vomiting)    Pre-diabetes    Sleep apnea    cpap since 10    PAST SURGICAL HISTORY: Past Surgical History:  Procedure Laterality Date   BILATERAL TOTAL MASTECTOMY WITH AXILLARY LYMPH NODE DISSECTION     BREAST RECONSTRUCTION Left 07/17/2022   for encapsulation,  done at [facility]   CERVICAL DISC ARTHROPLASTY N/A 03/19/2013    Procedure: CERVICAL FIVE TO SIX, CERVICAL SIX TO SEVEN CERVICAL ANTERIOR DISC ARTHROPLASTY;  Surgeon: Shove Colon, MD;  Location: [facility];  Service: Neurosurgery;  Laterality: N/A;  C56 C67 artificial disc replacement   CESAREAN SECTION     COLONOSCOPY     DILATATION & CURETTAGE/HYSTEROSCOPY WITH TRUECLEAR N/A 11/25/2013   Procedure: DILATATION & CURETTAGE/HYSTEROSCOPY WITH TRUCLEAR;  Surgeon: Charlie CHRISTELLA Croak, MD;  Location: [facility];  Service: Gynecology;  Laterality: N/A;   ECTOPIC PREGNANCY SURGERY     GALLBLADDER SURGERY     MOUTH SURGERY     child- dog bite   SPINAL FUSION     2023   STERIOD INJECTION Left 09/09/2022   Procedure: LEFT KNEE STEROID INJECTION;  Surgeon: Melodi Lerner, MD;  Location: [facility];  Service: Orthopedics;  Laterality: Left;  left knee injection 0928   TONSILLECTOMY     TOTAL KNEE ARTHROPLASTY Right 09/09/2022   Procedure: TOTAL KNEE ARTHROPLASTY;  Surgeon: Melodi Lerner, MD;  Location: [facility];  Service: Orthopedics;  Laterality: Right;   TUBAL LIGATION      FAMILY HISTORY: Family History  Problem Relation Age of Onset   Hyperlipidemia Mother    Heart disease Father    Hypertension Father    Diabetes Father    Cancer Maternal Aunt        Breast - In 81's   Cancer Paternal Aunt        Breast - in 72's   Cancer Maternal Grandmother        breast cancer   Diabetes Paternal Grandmother    Heart disease Paternal Grandfather    Hypertension Paternal Grandfather    Suicidality Other    Sleep apnea Neg Hx     SOCIAL HISTORY: Social History   Socioeconomic History   Marital status: Married    Spouse name: Signe   Number of children: 3   Years of education: college   Highest education level: Not on file  Occupational History   Occupation: N/A  Tobacco Use   Smoking status: Never   Smokeless tobacco: Never   Tobacco comments:    occ alcohol   Vaping Use   Vaping status: Never Used  Substance and Sexual Activity   Alcohol  use: Yes     Alcohol /week: 1.0 standard drink of alcohol     Types: 1 Cans of beer per week    Comment: occ   Drug use: No   Sexual activity: Not Currently  Other Topics Concern   Not on file  Social History Narrative   Denies caffeine use    Social Drivers of Corporate investment banker Strain: Not on file  Food Insecurity: No Food Insecurity (09/09/2022)   Hunger Vital Sign    Worried About Running Out of Food in the Last Year: Never true    Ran Out of Food in the Last Year: Never true  Transportation Needs: No Transportation Needs (09/09/2022)   PRAPARE - Administrator, Civil Service (Medical): No    Lack of Transportation (Non-Medical): No  Physical Activity: Not on file  Stress: Not on file  Social Connections: Unknown (06/04/2021)   Received from [facility]   Social Network    Social Network: Not on file  Intimate Partner Violence: Not At Risk (09/09/2022)   Humiliation, Afraid, Rape, and Kick questionnaire    Fear of Current or Ex-Partner: No    Emotionally Abused: No    Physically Abused: No    Sexually Abused: No      PHYSICAL EXAM  Generalized: Well developed, in no acute distress  Chest: Lungs clear to auscultation bilaterally  Neurological examination  Mentation: Alert oriented to time, place, history taking. Follows all commands speech and language fluent Cranial nerve II-XII: Extraocular movements were full, visual field were full on confrontational test Head turning and shoulder shrug  were normal and symmetric. Motor: The motor testing reveals 5 over 5 strength of  all 4 extremities. Good symmetric motor tone is noted throughout.  Sensory: Sensory testing is intact to soft touch on all 4 extremities. No evidence of extinction is noted.  Gait and station: Gait is normal.    DIAGNOSTIC DATA (LABS, IMAGING, TESTING) - I reviewed patient records, labs, notes, testing and imaging myself where available.  Lab Results  Component Value Date   WBC 6.7  10/28/2023   HGB 16.1 (H) 10/28/2023   HCT 49.1 (H) 10/28/2023   MCV 88.0 10/28/2023   PLT 203 10/28/2023      Component Value Date/Time   NA 140 10/28/2023 1109   K 4.0 10/28/2023 1109   CL 104 10/28/2023 1109   CO2 26 10/28/2023 1109   GLUCOSE 93 10/28/2023 1109   BUN 16 10/28/2023 1109   CREATININE 0.70 10/28/2023 1109   CREATININE 0.73 01/19/2018 1556   CALCIUM  9.7 10/28/2023 1109   PROT 6.8 01/19/2018 1556   ALBUMIN  3.6 01/19/2018 1556   AST 15 01/19/2018 1556   ALT 15 01/19/2018 1556   ALKPHOS 53 01/19/2018 1556   BILITOT 0.3 01/19/2018 1556   GFRNONAA >60 10/28/2023 1109   GFRNONAA >60 01/19/2018 1556   GFRAA >60 01/19/2018 1556      ASSESSMENT AND PLAN 63 y.o. year old female  has a past medical history of Anxiety, Arthritis, Back pain of lumbar region with sciatica, Cancer (HCC), Depression, Family history of anesthesia complication, H/O carpal tunnel repair (2014), Headache(784.0), Hyperlipidemia, Hypertension, Hypothyroidism, Medial meniscus tear, PONV (postoperative nausea and vomiting), Pre-diabetes, and Sleep apnea. here with:  OSA on CPAP  - CPAP compliance excellent - Good treatment of AHI  - Encourage patient to use CPAP nightly and > 4 hours each night -Reviewed home sleep test with the patient advised that it did show moderate sleep apnea this was from June 2024 - F/U in 1 year or sooner if needed   Duwaine Russell, MSN, NP-C 11/04/2023, 2:17 PM [facility] [address], [address] [place], [place] [address] [phone]  The patient's condition requires frequent monitoring and adjustments in the treatment plan, reflecting the ongoing complexity of care.  This provider is the continuing focal point for all needed services for this condition.

## 2023-11-06 ENCOUNTER — Ambulatory Visit: Admitting: Physical Therapy

## 2023-11-06 ENCOUNTER — Encounter: Payer: Self-pay | Admitting: Physical Therapy

## 2023-11-06 DIAGNOSIS — G8929 Other chronic pain: Secondary | ICD-10-CM

## 2023-11-06 DIAGNOSIS — M6281 Muscle weakness (generalized): Secondary | ICD-10-CM

## 2023-11-06 DIAGNOSIS — Z96651 Presence of right artificial knee joint: Secondary | ICD-10-CM

## 2023-11-06 DIAGNOSIS — M549 Dorsalgia, unspecified: Secondary | ICD-10-CM | POA: Diagnosis not present

## 2023-11-06 DIAGNOSIS — M25561 Pain in right knee: Secondary | ICD-10-CM

## 2023-11-06 DIAGNOSIS — M25661 Stiffness of right knee, not elsewhere classified: Secondary | ICD-10-CM

## 2023-11-06 NOTE — Therapy (Signed)
 OUTPATIENT PHYSICAL THERAPY LOWER EXTREMITY TREATMENT    Patient Name: [name] MRN: [id number] DOB:January 19, 1961, 63 y.o., female Today's Date: 11/06/2023  END OF SESSION:  PT End of Session - 11/06/23 1540     Visit Number 19    Date for Recertification  11/21/23    Authorization Type [employer]    PT Start Time 1530    PT Stop Time 1615    PT Time Calculation (min) 45 min    Activity Tolerance Patient tolerated treatment well    Behavior During Therapy WFL for tasks assessed/performed            Past Medical History:  Diagnosis Date   Anxiety    Arthritis    Back pain of lumbar region with sciatica    Cancer (HCC)    breast   Depression    Family history of anesthesia complication    grandfather died under anesthesia 70's- had heart issues   H/O carpal tunnel repair 2014   Headache(784.0)    Hyperlipidemia    Hypertension    Hypothyroidism    Medial meniscus tear    PONV (postoperative nausea and vomiting)    Pre-diabetes    Sleep apnea    cpap since 10   Past Surgical History:  Procedure Laterality Date   BILATERAL TOTAL MASTECTOMY WITH AXILLARY LYMPH NODE DISSECTION     BREAST RECONSTRUCTION Left 07/17/2022   for encapsulation,  done at [facility]   CERVICAL DISC ARTHROPLASTY N/A 03/19/2013   Procedure: CERVICAL FIVE TO SIX, CERVICAL SIX TO SEVEN CERVICAL ANTERIOR DISC ARTHROPLASTY;  Surgeon: Victory Gens, MD;  Location: [facility];  Service: Neurosurgery;  Laterality: N/A;  C56 C67 artificial disc replacement   CESAREAN SECTION     COLONOSCOPY     DILATATION & CURETTAGE/HYSTEROSCOPY WITH TRUECLEAR N/A 11/25/2013   Procedure: DILATATION & CURETTAGE/HYSTEROSCOPY WITH TRUCLEAR;  Surgeon: Charlie CHRISTELLA Croak, MD;  Location: [facility];  Service: Gynecology;  Laterality: N/A;   ECTOPIC PREGNANCY SURGERY     GALLBLADDER SURGERY     MOUTH SURGERY     child- dog bite   SPINAL FUSION     2023   STERIOD INJECTION Left 09/09/2022   Procedure: LEFT KNEE STEROID  INJECTION;  Surgeon: Melodi Lerner, MD;  Location: [facility];  Service: Orthopedics;  Laterality: Left;  left knee injection 0928   TONSILLECTOMY     TOTAL KNEE ARTHROPLASTY Right 09/09/2022   Procedure: TOTAL KNEE ARTHROPLASTY;  Surgeon: Melodi Lerner, MD;  Location: [facility];  Service: Orthopedics;  Laterality: Right;   TUBAL LIGATION     Patient Active Problem List   Diagnosis Date Noted   OA (osteoarthritis) of knee 09/09/2022   Exertional dyspnea 04/17/2022   Elevated coronary artery calcium  score 04/17/2022   Spondylolisthesis at L4-L5 level 12/27/2021   Ductal carcinoma in situ (DCIS) of left breast 01/19/2018   Generalized anxiety disorder 11/07/2017   Depression 11/07/2017   Insomnia 11/07/2017   Cervical spondylosis 03/19/2013    PCP: Norleen Jungling  REFERRING PROVIDER: Lerner Lawn  REFERRING DIAG: R TKA 09/09/22  THERAPY DIAG:  Chronic bilateral back pain, unspecified back location  S/P total knee arthroplasty, right  Stiffness of right knee, not elsewhere classified  Muscle weakness (generalized)  Acute pain of right knee  Rationale for Evaluation and Treatment: Rehabilitation  ONSET DATE: 09/09/22  SUBJECTIVE:   SUBJECTIVE STATEMENT :  Patient will have left TKA on 11/10/23 Reports doing okay,questions regarding after surgery and the recovery  Patient  had right breast revision on 07/17/23 due to scar encapsulation.  She is returning here due to the back pain.  Reports that she is doing a little better due to being less active with this surgery  Saw Dr. Colon last Friday, has a bulging disc on the right at L2, recommended continuation of PT.  Will have injection in back May 20th R TKA 8/19 L knee steroid injection 09/09/22  PAIN:  Are you having pain? Yes: NPRS scale: 6/10 Pain location: right low back, buttock and reports around to the front of the thigh Pain description: sharp really hurts, ache Aggravating factors: difficulty sleeping, difficulty walking  and standing and sitting pain up to 9/10 trying to clean the house recently Relieving factors: heat, Aleve , tramadol  and methacarbomol, the PT treatment was helping  PRECAUTIONS: None no baths, no pushing up, no weights with arms  RED FLAGS: None   WEIGHT BEARING RESTRICTIONS: No  FALLS:  Has patient fallen in last 6 months? No  LIVING ENVIRONMENT: Lives with: lives with their family and lives with their spouse Lives in: House/apartment Stairs: Yes: External: 3 steps; none Has following equipment at home: Single point cane and Walker - 2 wheeled  OCCUPATION: Not working  PLOF: Independent  PATIENT GOALS: less back pain  NEXT MD VISIT: none for Dr. Colon  OBJECTIVE:   PATIENT SURVEYS:    COGNITION: Overall cognitive status: Within functional limits for tasks assessed      EDEMA:    POSTURE: rounded shoulders and flexed trunk   PALPATION: Very tender in the right lumbar L2-5, some tenderness in the buttock  LOWER EXTREMITY ROM:  Active ROM Right eval   Right 09/26/22 Right 10/02/22  11/01/22 12/09/22  Hip flexion          Hip extension          Hip abduction          Hip adduction          Hip internal rotation          Hip external rotation          Knee flexion 110    82* AROM seated per pt request  78* self AAROM, 76* AROM  PROM 87 AROM 81 PROM 90 AROM 83 AROM 101 PROM 105  Knee extension 0    -16* supine heel prop with quad set  AROM 25 PROM 16 AROM 14 AROM 5 PROM 0  Ankle dorsiflexion          Ankle plantarflexion          Ankle inversion          Ankle eversion           (Blank rows = not tested)  LOWER EXTREMITY MMT:  MMT Right eval Left eval  Hip flexion 3+   Hip extension    Hip abduction    Hip adduction    Hip internal rotation    Hip external rotation    Knee flexion 4+   Knee extension 4+   Ankle dorsiflexion    Ankle plantarflexion    Ankle inversion    Ankle eversion     (Blank rows = not tested)  LUMBAR ROM:  decreased  25% flexion, decreased 75% for extension and side bending, pain was worse with extension and right side bending. Hooklying is less painful that lying flat on back Mild tightness with pain during HS stretch, tight and painful piriformis bilaterally worse on the right ,  very weak core  FUNCTIONAL TESTS:  TUG 18 seconds no device  GAIT: No assistive device, slow mild antalgic on the right    TODAY'S TREATMENT:                                                                                                                              DATE:  11/06/23 Gait outside walking down and up slope Bike level 4 x 6 minutes seat position 5 Education on stairs, stairs with walker Reviewed HEP and performed gave a lot of verbal and tactile instruction to get the mms working and what she needs to be aware of and look out for Education and perform walking with walker Went over how to get in and out of the shower, problem solved how to do it. Went over RICE, motion is lotion, to get up and move and walk every hour AROM of the left knee was 0-115 degrees flexion Went over how to get leg in the bed with strap or hooking foot with other foot  11/03/23 Bike level 4 seat position #5 x 6 minutes Gait outside to back building up and down stairs step over step Passive stretch bilateral knees, LE's STM to the low back and into the buttocks Answered questions and went over what to expect, use of walker, up and moving every hour, stretching.  Ice, elevation , nothing behind the knee Leg curls 20# 2x15 Leg press 20# 2x15 Manual sheet traction STM to the low back and into the buttocks  10/30/23 Bike level 4 x 5 minutes Gait outside good pace, no rest slight limp in the left Seated rows 15# Lats 15# Slant board stretch 20# HS curls 5# extension PAssive stretch LE's Manual sheet traction lumbar STM to the right low back  10/27/23 Gait outside brisk pace around the back building Bike level  x 6 minutes  seat position 6 Had her demo HEP for post op with minimal cues from me, she did have questions LEg press able to go to position #1 without weight and do position # 4 with 20# Manual sheet traction STM to the low back mms  10/23/23 Gait outside walking around the building Bike level 5 x 5 minutes Reviewed and performed HEP for post op, she had questions and needed some guidance.  Verbal and tactile cues needed STM to the right low back Manual lumbar traction  Passive LE stretches  10/20/23 Bike level 4 x 6 minutes Tmill pushes Reviewed and PT performed and demo of all the HEP from the pre surgery visit she had last week, went over the RICE and walking every hour, cues for quad activation and to decrease stress on back Manual pelvic sheet traction Passive LE stretches  10/16/23 Bike level 5 x 6 minutes Gait outside also back building 2 flights of stairs up and down and then up again Leg curls 25# 2x10 5# leg extension 20# lat pulls Feet on ball K2C, rotation,  bridge, isometric abs Reviewed MD orders for TKA 11/10/23, reviewed the HEP they gave her  will need to go over this again  10/14/23 Gait outside around the back building brisk pace some SOB Bike LEvel 5 x 6 minutes Seated row 20# Lats 20# Passive LE and trunk stretches STM with the T-gun to the bilateral L/S area Manual sheet traction lumbar  10/08/23 Bike level 5 x 6 minutes Feet on ball K2C, rotation, small bridge, isometric abs Passive stretch LE's STM to the left low back and buttock Manual sheet traction  10/06/23 Bike level 5 x 6 minutes Gait around the building no rest brisk pace Seated rows 20# LAts 20# HS curls 25# PAssive stretch LE's Manual sheet traction STM with the Tgun to the left low back and buttock  10/02/23 Gait outside around the building, down stairs 2x and up 1x no rest good pace Bike level 5 x 6 minutes 5# straight arm pulls  5# LEg extension 20# HS curls Feet on ball K2C, rotation,  posterior activation, isometric abs Passive stretch HS STM to the left lumbar and buttock  09/29/23 Gait outside around the building 1.5 laps and then in the building and up and down the stairs Bike level 4 x 6 minutes 5# straight arm pulls with cues for posture and core 10# lats Feet on ball K2C, rotation, bridge, isometric abs STM with the Tgun to the bilateral low back area and into the HS Manual sheet traction gentle  09/25/23 Bike level 5 x 6 minutes Gait outside brisk pace 5# straight arm pulls 20# leg press 10# lats Green tband clamshells Ball b/n knees squeeze STM with the Tgun Manual sheet traction   09/16/23 Gait around the building good pace Bike level 5 x 6 minutes Red tband Row Red tband extension Feet on ball K2C, rotation, bridge, gentle isometric abs Manual sheet traction STM to the low back and buttocks  09/04/23 Bike level 4 x 6 minutes LEg press 20# Stairs step over step Gait outside  Manual sheet traction STM to the right low back and hip Feet on ball k2c, rotation, bridge, PPT  09/01/23 Bike level 4 x 6 minutes Gait outside around the small parking [employer] step over step 2 full flights  Feet on ball K2C, rotation, small bridge, PPT's PAssive stretch LE's Manual sheet traction Discussion about lifting grandchild to protect breast and back   08/28/23 Bike level 4 x 8 minutes Leg curls 25# Leg extension 5# Feet on ball K2C, rotation, bridges, PPT Green tband clamshells Ball b/n knees small bridge STM to the buttock and low back Manual sheet traction  08/25/23 Bike level 4 x 8 minutes Walk outside good pace, SOB and a limp on the left Feet on ball K2C, rotation, bridge, pelvic tilts needed a lot of verbal and tactile cues to get PPT Passive stretch LE's, calves are very tight calves, went over the stretch for home Manual sheet pelvic traction  08/21/23 Evaluation  PATIENT EDUCATION:  Education details: POC and HEP Person educated:  Patient Education method: Explanation Education comprehension: verbalized understanding  HOME EXERCISE PROGRAM: Access Code: [facility] Access Code: [email or web address] URL: [email or web address] Date: 08/21/23 Prepared by: Ozell Mainland  Exercises - Hooklying Single Knee to Chest Stretch  - 2 x daily - 7 x weekly - 1 sets - 10 reps - 5 hold - Supine Double Knee to Chest  - 2 x daily - 7 x weekly - 1 sets - 10 reps - 5 hold -  Supine Lower Trunk Rotation with PLB  - 2 x daily - 7 x weekly - 1 sets - 10 reps - 5 hold - Supine Posterior Pelvic Tilt  - 2 x daily - 7 x weekly - 1 sets - 10 reps - 3 hold  ASSESSMENT:  CLINICAL IMPRESSION:   Patient is scheduled for left TKA on 11/10/23, reviewed as noted above all TKA after care and really worked with her on walking and the HEP as she had a poor start after her TKA last year and really struggled to walk and gain ROM, we went over all we could answered questions as needed  Patient has a right disc herniation at L2-3.   She had the right breast surgery June 26, has some limitations with lifting for this.  She is still having some significant low back pain.  Very tender in the right L2 area.  Fair strength.  Has left knee pain and has pain up to 8/10 with walking has a left TKA scheduled for 11/10/23.  She is going to have a grandchild due August 12. OBJECTIVE IMPAIRMENTS: Abnormal gait, cardiopulmonary status limiting activity, decreased activity tolerance, decreased endurance, decreased mobility, difficulty walking, decreased ROM, decreased strength, increased muscle spasms, impaired flexibility, improper body mechanics, postural dysfunction, and pain.   REHAB POTENTIAL: Good  CLINICAL DECISION MAKING: Stable/uncomplicated  EVALUATION COMPLEXITY: Low   GOALS: Goals reviewed with patient? Yes  SHORT TERM GOALS: Target date: 09/21/23  Patient will be independent with initial HEP. Goal status:  met 10/02/23   LONG TERM GOALS: Target  date: 01/21/24  Patient will be independent with advanced/ongoing HEP to improve outcomes and carryover.  Goal status: initial  2.  Patient will report at least 75% improvement in R knee pain to improve QOL. And back pain Baseline: 8/10 Goal status:progressing 10/16/23  3.  Patient will increase lumbar ROM 25% Baseline:as noted above Initial.  Met 09/29/23  4.  Patient will demonstrate improved functional LE strength as demonstrated by 5/5. Baseline: see above Goal status: met 10/23/23  5.  Patient will be able to ambulate 1000' without AD and normal gait pattern without increased pain to access community.  Baseline:  Goal status:met 09/16/23  6.  Decrease lumbar pain 50%  Goal status: met 10/14/23  7.  Report able to put shoes and socks on without pain > 4/10  Progressing 10/08/23  PLAN:  PT FREQUENCY: 2x/week  PT DURATION: 6 months  PLANNED INTERVENTIONS: Therapeutic exercises, Therapeutic activity, Neuromuscular re-education, Balance training, Gait training, Patient/Family education, Self Care, Joint mobilization, Stair training, Electrical stimulation, Cryotherapy, Moist heat, scar mobilization, Vasopneumatic device, and Manual therapy  PLAN FOR NEXT SESSION:  D/C she will have TKA next week  Patient Details  Name: ZAHAVA QUANT MRN: [id number] Date of Birth: 1960-11-20 Referring Provider:  Colon Shove, MD  Encounter Date: 11/06/2023   OBADIAH OZELL ORN, PT 11/06/2023, 3:42 PM

## 2023-11-10 ENCOUNTER — Encounter (HOSPITAL_COMMUNITY)
Admission: RE | Disposition: A | Discharge status: Home / Self Care | Payer: Self-pay | Source: Ambulatory Visit | Attending: Orthopedic Surgery

## 2023-11-10 ENCOUNTER — Observation Stay (HOSPITAL_COMMUNITY)
Admission: RE | Admit: 2023-11-10 | Discharge: 2023-11-11 | Disposition: A | Source: Ambulatory Visit | Attending: Orthopedic Surgery | Admitting: Orthopedic Surgery

## 2023-11-10 ENCOUNTER — Other Ambulatory Visit: Payer: Self-pay

## 2023-11-10 ENCOUNTER — Ambulatory Visit (HOSPITAL_COMMUNITY): Admitting: Anesthesiology

## 2023-11-10 ENCOUNTER — Encounter (HOSPITAL_COMMUNITY): Payer: Self-pay | Admitting: Orthopedic Surgery

## 2023-11-10 ENCOUNTER — Ambulatory Visit (HOSPITAL_COMMUNITY): Payer: Self-pay | Admitting: Physician Assistant

## 2023-11-10 DIAGNOSIS — I10 Essential (primary) hypertension: Secondary | ICD-10-CM | POA: Diagnosis not present

## 2023-11-10 DIAGNOSIS — Z7984 Long term (current) use of oral hypoglycemic drugs: Secondary | ICD-10-CM | POA: Diagnosis not present

## 2023-11-10 DIAGNOSIS — F418 Other specified anxiety disorders: Secondary | ICD-10-CM

## 2023-11-10 DIAGNOSIS — I2510 Atherosclerotic heart disease of native coronary artery without angina pectoris: Secondary | ICD-10-CM

## 2023-11-10 DIAGNOSIS — M1712 Unilateral primary osteoarthritis, left knee: Secondary | ICD-10-CM

## 2023-11-10 DIAGNOSIS — Z79899 Other long term (current) drug therapy: Secondary | ICD-10-CM | POA: Insufficient documentation

## 2023-11-10 DIAGNOSIS — E039 Hypothyroidism, unspecified: Secondary | ICD-10-CM | POA: Insufficient documentation

## 2023-11-10 DIAGNOSIS — Z853 Personal history of malignant neoplasm of breast: Secondary | ICD-10-CM | POA: Insufficient documentation

## 2023-11-10 DIAGNOSIS — Z96651 Presence of right artificial knee joint: Secondary | ICD-10-CM | POA: Insufficient documentation

## 2023-11-10 DIAGNOSIS — M25562 Pain in left knee: Secondary | ICD-10-CM | POA: Diagnosis present

## 2023-11-10 DIAGNOSIS — M179 Osteoarthritis of knee, unspecified: Secondary | ICD-10-CM | POA: Diagnosis present

## 2023-11-10 HISTORY — PX: TOTAL KNEE ARTHROPLASTY: SHX125

## 2023-11-10 LAB — GLUCOSE, CAPILLARY
Glucose-Capillary: 102 mg/dL — ABNORMAL HIGH (ref 70–99)
Glucose-Capillary: 113 mg/dL — ABNORMAL HIGH (ref 70–99)
Glucose-Capillary: 167 mg/dL — ABNORMAL HIGH (ref 70–99)
Glucose-Capillary: 160 mg/dL — ABNORMAL HIGH (ref 70–99)
Glucose-Capillary: 186 mg/dL — ABNORMAL HIGH (ref 70–99)

## 2023-11-10 SURGERY — ARTHROPLASTY, KNEE, TOTAL
Anesthesia: General | Site: Knee | Laterality: Left

## 2023-11-10 MED ORDER — METOCLOPRAMIDE HCL 5 MG PO TABS: 5.0000 mg | ORAL_TABLET | Freq: Three times a day (TID) | ORAL | Status: DC | PRN

## 2023-11-10 MED ORDER — DEXAMETHASONE SOD PHOSPHATE PF 10 MG/ML IJ SOLN
8.0000 mg | Freq: Once | INTRAMUSCULAR | Status: AC
  Administered 2023-11-10: 8 mg via INTRAVENOUS

## 2023-11-10 MED ORDER — HYDROMORPHONE HCL 1 MG/ML IJ SOLN
INTRAMUSCULAR | Status: DC | PRN
  Administered 2023-11-10: 1 mg via INTRAVENOUS
  Administered 2023-11-10 (×2): .5 mg via INTRAVENOUS

## 2023-11-10 MED ORDER — MORPHINE SULFATE (PF) 2 MG/ML IV SOLN: 1.0000 mg | INTRAVENOUS | Status: DC | PRN

## 2023-11-10 MED ORDER — TRAMADOL HCL 50 MG PO TABS
50.0000 mg | ORAL_TABLET | Freq: Four times a day (QID) | ORAL | Status: DC | PRN
  Administered 2023-11-10: 100 mg via ORAL
  Filled 2023-11-10: qty 2

## 2023-11-10 MED ORDER — LIDOCAINE HCL (PF) 2 % IJ SOLN
INTRAMUSCULAR | Status: DC | PRN
  Administered 2023-11-10: 60 mg via INTRADERMAL

## 2023-11-10 MED ORDER — PROPOFOL 500 MG/50ML IV EMUL
INTRAVENOUS | Status: DC | PRN
  Administered 2023-11-10: 175 ug/kg/min via INTRAVENOUS

## 2023-11-10 MED ORDER — LACTATED RINGERS IV SOLN
INTRAVENOUS | Status: DC
Start: 2023-11-10 — End: 2023-11-10

## 2023-11-10 MED ORDER — MENTHOL 3 MG MT LOZG: 1.0000 | LOZENGE | OROMUCOSAL | Status: DC | PRN

## 2023-11-10 MED ORDER — SUMATRIPTAN SUCCINATE 50 MG PO TABS: 50.0000 mg | ORAL_TABLET | ORAL | Status: DC | PRN

## 2023-11-10 MED ORDER — NAPHAZOLINE-PHENIRAMINE 0.027-0.315 % OP SOLN: 1.0000 [drp] | Freq: Three times a day (TID) | OPHTHALMIC | Status: DC | PRN

## 2023-11-10 MED ORDER — NAPHAZOLINE-PHENIRAMINE 0.025-0.3 % OP SOLN: 1.0000 [drp] | Freq: Every day | OPHTHALMIC | Status: DC | PRN

## 2023-11-10 MED ORDER — BUPIVACAINE LIPOSOME 1.3 % IJ SUSP
INTRAMUSCULAR | Status: DC | PRN
  Administered 2023-11-10: 20 mL

## 2023-11-10 MED ORDER — FENTANYL CITRATE (PF) 100 MCG/2ML IJ SOLN
INTRAMUSCULAR | Status: DC | PRN
  Administered 2023-11-10 (×2): 50 ug via INTRAVENOUS

## 2023-11-10 MED ORDER — TRANEXAMIC ACID-NACL 1000-0.7 MG/100ML-% IV SOLN
1000.0000 mg | INTRAVENOUS | Status: AC
  Administered 2023-11-10: 1000 mg via INTRAVENOUS
  Filled 2023-11-10: qty 100

## 2023-11-10 MED ORDER — THYROID 30 MG PO TABS
30.0000 mg | ORAL_TABLET | Freq: Every day | ORAL | Status: DC
  Administered 2023-11-11: 30 mg via ORAL
  Filled 2023-11-10: qty 1

## 2023-11-10 MED ORDER — PHENOL 1.4 % MT LIQD: 1.0000 | OROMUCOSAL | Status: DC | PRN

## 2023-11-10 MED ORDER — MONTELUKAST SODIUM 10 MG PO TABS
10.0000 mg | ORAL_TABLET | ORAL | Status: DC
  Administered 2023-11-11: 10 mg via ORAL
  Filled 2023-11-10: qty 1

## 2023-11-10 MED ORDER — PROPOFOL 10 MG/ML IV BOLUS
INTRAVENOUS | Status: DC | PRN
Start: 2023-11-10 — End: 2023-11-10
  Administered 2023-11-10: 160 mg via INTRAVENOUS

## 2023-11-10 MED ORDER — ACETAMINOPHEN 10 MG/ML IV SOLN
1000.0000 mg | Freq: Four times a day (QID) | INTRAVENOUS | Status: DC
  Administered 2023-11-10: 1000 mg via INTRAVENOUS
  Filled 2023-11-10: qty 100

## 2023-11-10 MED ORDER — RIZATRIPTAN BENZOATE 10 MG PO TBDP: 10.0000 mg | ORAL_TABLET | ORAL | Status: DC | PRN

## 2023-11-10 MED ORDER — ATORVASTATIN CALCIUM 20 MG PO TABS
20.0000 mg | ORAL_TABLET | Freq: Every day | ORAL | Status: DC
  Administered 2023-11-11: 20 mg via ORAL
  Filled 2023-11-10: qty 1

## 2023-11-10 MED ORDER — FENTANYL CITRATE (PF) 100 MCG/2ML IJ SOLN
INTRAMUSCULAR | Status: AC
  Filled 2023-11-10: qty 2

## 2023-11-10 MED ORDER — ACETAMINOPHEN 500 MG PO TABS
1000.0000 mg | ORAL_TABLET | Freq: Four times a day (QID) | ORAL | Status: AC
  Administered 2023-11-10 – 2023-11-11 (×3): 1000 mg via ORAL
  Filled 2023-11-10 (×5): qty 2

## 2023-11-10 MED ORDER — BUPIVACAINE LIPOSOME 1.3 % IJ SUSP: 20.0000 mL | Freq: Once | INTRAMUSCULAR | Status: DC

## 2023-11-10 MED ORDER — ONDANSETRON HCL 4 MG/2ML IJ SOLN
INTRAMUSCULAR | Status: DC | PRN
  Administered 2023-11-10: 4 mg via INTRAVENOUS

## 2023-11-10 MED ORDER — PROPOFOL 10 MG/ML IV BOLUS
INTRAVENOUS | Status: AC
  Filled 2023-11-10: qty 20

## 2023-11-10 MED ORDER — SODIUM CHLORIDE (PF) 0.9 % IJ SOLN
INTRAMUSCULAR | Status: AC
  Filled 2023-11-10: qty 10

## 2023-11-10 MED ORDER — ONDANSETRON HCL 4 MG PO TABS: 4.0000 mg | ORAL_TABLET | Freq: Four times a day (QID) | ORAL | Status: DC | PRN

## 2023-11-10 MED ORDER — INSULIN ASPART 100 UNIT/ML IJ SOLN
0.0000 [IU] | Freq: Three times a day (TID) | INTRAMUSCULAR | Status: DC
  Administered 2023-11-10: 3 [IU] via SUBCUTANEOUS
  Administered 2023-11-11: 2 [IU] via SUBCUTANEOUS

## 2023-11-10 MED ORDER — RIVAROXABAN 10 MG PO TABS
10.0000 mg | ORAL_TABLET | Freq: Every day | ORAL | Status: DC
  Administered 2023-11-11: 10 mg via ORAL
  Filled 2023-11-10: qty 1

## 2023-11-10 MED ORDER — STERILE WATER FOR IRRIGATION IR SOLN
Status: DC | PRN
Start: 2023-11-10 — End: 2023-11-10
  Administered 2023-11-10: 1000 mL

## 2023-11-10 MED ORDER — DEXMEDETOMIDINE HCL IN NACL 80 MCG/20ML IV SOLN
INTRAVENOUS | Status: AC
  Filled 2023-11-10: qty 20

## 2023-11-10 MED ORDER — SODIUM CHLORIDE (PF) 0.9 % IJ SOLN
INTRAMUSCULAR | Status: AC
  Filled 2023-11-10: qty 50

## 2023-11-10 MED ORDER — CEFAZOLIN SODIUM-DEXTROSE 2-4 GM/100ML-% IV SOLN
2.0000 g | Freq: Four times a day (QID) | INTRAVENOUS | Status: AC
  Administered 2023-11-10 (×2): 2 g via INTRAVENOUS
  Filled 2023-11-10 (×2): qty 100

## 2023-11-10 MED ORDER — INSULIN ASPART 100 UNIT/ML IJ SOLN: 0.0000 [IU] | INTRAMUSCULAR | Status: DC | PRN

## 2023-11-10 MED ORDER — DEXAMETHASONE SOD PHOSPHATE PF 10 MG/ML IJ SOLN
10.0000 mg | Freq: Once | INTRAMUSCULAR | Status: AC
  Administered 2023-11-11: 10 mg via INTRAVENOUS

## 2023-11-10 MED ORDER — HYDROMORPHONE HCL 1 MG/ML IJ SOLN
INTRAMUSCULAR | Status: AC
  Filled 2023-11-10: qty 2

## 2023-11-10 MED ORDER — FLEET ENEMA RE ENEM: 1.0000 | ENEMA | Freq: Once | RECTAL | Status: DC | PRN

## 2023-11-10 MED ORDER — POVIDONE-IODINE 10 % EX SWAB: 2.0000 | Freq: Once | CUTANEOUS | Status: DC

## 2023-11-10 MED ORDER — LOSARTAN POTASSIUM 50 MG PO TABS
50.0000 mg | ORAL_TABLET | Freq: Every day | ORAL | Status: DC
  Administered 2023-11-11: 50 mg via ORAL
  Filled 2023-11-10: qty 1

## 2023-11-10 MED ORDER — POLYETHYLENE GLYCOL 3350 17 G PO PACK: 17.0000 g | PACK | Freq: Every day | ORAL | Status: DC | PRN

## 2023-11-10 MED ORDER — UBROGEPANT 50 MG PO TABS: 50.0000 mg | ORAL_TABLET | Freq: Every day | ORAL | Status: DC | PRN

## 2023-11-10 MED ORDER — BUPIVACAINE-EPINEPHRINE (PF) 0.5% -1:200000 IJ SOLN
INTRAMUSCULAR | Status: DC | PRN
  Administered 2023-11-10: 20 mL via PERINEURAL

## 2023-11-10 MED ORDER — OXYCODONE HCL 5 MG PO TABS
ORAL_TABLET | ORAL | Status: AC
  Filled 2023-11-10: qty 2

## 2023-11-10 MED ORDER — METHOCARBAMOL 500 MG PO TABS
ORAL_TABLET | ORAL | Status: AC
  Filled 2023-11-10: qty 1

## 2023-11-10 MED ORDER — METHOCARBAMOL 500 MG PO TABS
500.0000 mg | ORAL_TABLET | Freq: Four times a day (QID) | ORAL | Status: DC | PRN
  Administered 2023-11-10 – 2023-11-11 (×4): 500 mg via ORAL
  Filled 2023-11-10 (×3): qty 1

## 2023-11-10 MED ORDER — ACETAMINOPHEN 325 MG PO TABS: 325.0000 mg | ORAL_TABLET | Freq: Four times a day (QID) | ORAL | Status: DC | PRN

## 2023-11-10 MED ORDER — ORAL CARE MOUTH RINSE: 15.0000 mL | Freq: Once | OROMUCOSAL | Status: AC

## 2023-11-10 MED ORDER — HYDROMORPHONE HCL 1 MG/ML IJ SOLN
0.2500 mg | INTRAMUSCULAR | Status: DC | PRN
  Administered 2023-11-10 (×4): 0.5 mg via INTRAVENOUS

## 2023-11-10 MED ORDER — BISACODYL 10 MG RE SUPP: 10.0000 mg | Freq: Every day | RECTAL | Status: DC | PRN

## 2023-11-10 MED ORDER — PROPOFOL 1000 MG/100ML IV EMUL
INTRAVENOUS | Status: AC
  Filled 2023-11-10: qty 100

## 2023-11-10 MED ORDER — HYDROMORPHONE HCL 1 MG/ML IJ SOLN
0.2500 mg | INTRAMUSCULAR | Status: DC | PRN
  Administered 2023-11-10 (×2): 0.5 mg via INTRAVENOUS

## 2023-11-10 MED ORDER — METOCLOPRAMIDE HCL 5 MG/ML IJ SOLN: 5.0000 mg | Freq: Three times a day (TID) | INTRAMUSCULAR | Status: DC | PRN

## 2023-11-10 MED ORDER — VILAZODONE HCL 20 MG PO TABS
30.0000 mg | ORAL_TABLET | Freq: Every day | ORAL | Status: DC
  Administered 2023-11-11: 30 mg via ORAL
  Filled 2023-11-10: qty 1.5

## 2023-11-10 MED ORDER — CHLORHEXIDINE GLUCONATE 0.12 % MT SOLN
15.0000 mL | Freq: Once | OROMUCOSAL | Status: AC
  Administered 2023-11-10: 15 mL via OROMUCOSAL

## 2023-11-10 MED ORDER — DIPHENHYDRAMINE HCL 12.5 MG/5ML PO ELIX: 12.5000 mg | ORAL_SOLUTION | ORAL | Status: DC | PRN

## 2023-11-10 MED ORDER — OXYCODONE HCL 5 MG PO TABS
10.0000 mg | ORAL_TABLET | ORAL | Status: DC | PRN
  Administered 2023-11-10: 15 mg via ORAL
  Administered 2023-11-11: 10 mg via ORAL
  Administered 2023-11-11: 15 mg via ORAL
  Administered 2023-11-11: 10 mg via ORAL
  Filled 2023-11-10: qty 3
  Filled 2023-11-10 (×3): qty 2
  Filled 2023-11-10: qty 3

## 2023-11-10 MED ORDER — LACTATED RINGERS IV SOLN: INTRAVENOUS | Status: DC

## 2023-11-10 MED ORDER — SCOPOLAMINE 1 MG/3DAYS TD PT72
1.0000 | MEDICATED_PATCH | TRANSDERMAL | Status: DC
  Administered 2023-11-10: 1 mg via TRANSDERMAL
  Filled 2023-11-10: qty 1

## 2023-11-10 MED ORDER — DEXMEDETOMIDINE HCL IN NACL 80 MCG/20ML IV SOLN
INTRAVENOUS | Status: DC | PRN
  Administered 2023-11-10 (×3): 4 ug via INTRAVENOUS

## 2023-11-10 MED ORDER — BUPIVACAINE LIPOSOME 1.3 % IJ SUSP
INTRAMUSCULAR | Status: AC
  Filled 2023-11-10: qty 20

## 2023-11-10 MED ORDER — OXYCODONE HCL 5 MG PO TABS
5.0000 mg | ORAL_TABLET | ORAL | Status: DC | PRN
  Administered 2023-11-10 (×2): 10 mg via ORAL

## 2023-11-10 MED ORDER — HYDROMORPHONE HCL 1 MG/ML IJ SOLN
INTRAMUSCULAR | Status: AC
  Filled 2023-11-10: qty 1

## 2023-11-10 MED ORDER — ONDANSETRON HCL 4 MG/2ML IJ SOLN: 4.0000 mg | Freq: Four times a day (QID) | INTRAMUSCULAR | Status: DC | PRN

## 2023-11-10 MED ORDER — ALPRAZOLAM 0.5 MG PO TABS
0.5000 mg | ORAL_TABLET | Freq: Three times a day (TID) | ORAL | Status: DC | PRN
  Administered 2023-11-10: 0.5 mg via ORAL
  Filled 2023-11-10: qty 1

## 2023-11-10 MED ORDER — MIDAZOLAM HCL 2 MG/2ML IJ SOLN
INTRAMUSCULAR | Status: AC
Start: 2023-11-10 — End: 2023-11-10
  Filled 2023-11-10: qty 2

## 2023-11-10 MED ORDER — 0.9 % SODIUM CHLORIDE (POUR BTL) OPTIME
TOPICAL | Status: DC | PRN
Start: 2023-11-10 — End: 2023-11-10
  Administered 2023-11-10: 1000 mL

## 2023-11-10 MED ORDER — CEFAZOLIN SODIUM-DEXTROSE 2-4 GM/100ML-% IV SOLN
2.0000 g | INTRAVENOUS | Status: AC
  Administered 2023-11-10: 2 g via INTRAVENOUS
  Filled 2023-11-10: qty 100

## 2023-11-10 MED ORDER — SODIUM CHLORIDE 0.9 % IR SOLN
Status: DC | PRN
  Administered 2023-11-10: 1000 mL

## 2023-11-10 MED ORDER — MIDAZOLAM HCL 5 MG/5ML IJ SOLN
INTRAMUSCULAR | Status: DC | PRN
  Administered 2023-11-10: 1 mg via INTRAVENOUS

## 2023-11-10 MED ORDER — NAPHAZOLINE-PHENIRAMINE 0.025-0.3 % OP SOLN: 1.0000 [drp] | Freq: Three times a day (TID) | OPHTHALMIC | Status: DC | PRN

## 2023-11-10 MED ORDER — SODIUM CHLORIDE (PF) 0.9 % IJ SOLN
INTRAMUSCULAR | Status: DC | PRN
  Administered 2023-11-10: 60 mL via INTRAVENOUS

## 2023-11-10 MED ORDER — HYDROMORPHONE HCL 2 MG/ML IJ SOLN
INTRAMUSCULAR | Status: AC
  Filled 2023-11-10: qty 1

## 2023-11-10 MED ORDER — VILAZODONE HCL 20 MG PO TABS: 30.0000 mg | ORAL_TABLET | Freq: Every day | ORAL | Status: DC

## 2023-11-10 MED ORDER — GABAPENTIN 300 MG PO CAPS
300.0000 mg | ORAL_CAPSULE | Freq: Three times a day (TID) | ORAL | Status: DC
  Administered 2023-11-10 – 2023-11-11 (×3): 300 mg via ORAL
  Filled 2023-11-10 (×3): qty 1

## 2023-11-10 MED ORDER — ONDANSETRON HCL 4 MG/2ML IJ SOLN
INTRAMUSCULAR | Status: AC
Start: 2023-11-10 — End: 2023-11-10
  Filled 2023-11-10: qty 2

## 2023-11-10 MED ORDER — EMPAGLIFLOZIN 10 MG PO TABS
10.0000 mg | ORAL_TABLET | Freq: Every day | ORAL | Status: DC
  Administered 2023-11-11: 10 mg via ORAL
  Filled 2023-11-10: qty 1

## 2023-11-10 MED ORDER — DOCUSATE SODIUM 100 MG PO CAPS
100.0000 mg | ORAL_CAPSULE | Freq: Two times a day (BID) | ORAL | Status: DC
  Administered 2023-11-10 – 2023-11-11 (×2): 100 mg via ORAL
  Filled 2023-11-10 (×2): qty 1

## 2023-11-10 MED ORDER — METHOCARBAMOL 1000 MG/10ML IJ SOLN: 500.0000 mg | Freq: Four times a day (QID) | INTRAMUSCULAR | Status: DC | PRN

## 2023-11-10 MED ORDER — HYDROCHLOROTHIAZIDE 12.5 MG PO TABS
12.5000 mg | ORAL_TABLET | Freq: Every day | ORAL | Status: DC
  Administered 2023-11-11: 12.5 mg via ORAL
  Filled 2023-11-10 (×2): qty 1

## 2023-11-10 MED ORDER — DOXAZOSIN MESYLATE 4 MG PO TABS
4.0000 mg | ORAL_TABLET | Freq: Every day | ORAL | Status: DC
  Administered 2023-11-10: 4 mg via ORAL
  Filled 2023-11-10: qty 1

## 2023-11-10 MED ORDER — SODIUM CHLORIDE 0.9 % IV SOLN: INTRAVENOUS | Status: DC

## 2023-11-10 SURGICAL SUPPLY — 41 items
ATTUNE MED DOME PAT 32 KNEE (Knees) IMPLANT
ATTUNE PS FEM LT SZ 4 CEM KNEE (Femur) IMPLANT
ATTUNE PSRP INSR SZ4 8 KNEE (Insert) IMPLANT
BAG COUNTER SPONGE SURGICOUNT (BAG) IMPLANT
BAG ZIPLOCK 12X15 (MISCELLANEOUS) ×1 IMPLANT
BASE TIBIAL ROT PLAT SZ 3 KNEE (Knees) IMPLANT
BLADE SAG 18X100X1.27 (BLADE) ×1 IMPLANT
BLADE SAW SGTL 11.0X1.19X90.0M (BLADE) ×1 IMPLANT
BNDG ELASTIC 6INX 5YD STR LF (GAUZE/BANDAGES/DRESSINGS) ×1 IMPLANT
BOWL SMART MIX CTS (DISPOSABLE) ×1 IMPLANT
CEMENT HV SMART SET (Cement) ×2 IMPLANT
COVER SURGICAL LIGHT HANDLE (MISCELLANEOUS) ×1 IMPLANT
CUFF TRNQT CYL 34X4.125X (TOURNIQUET CUFF) ×1 IMPLANT
DERMABOND ADVANCED .7 DNX12 (GAUZE/BANDAGES/DRESSINGS) ×1 IMPLANT
DRAPE U-SHAPE 47X51 STRL (DRAPES) ×1 IMPLANT
DRSG AQUACEL AG ADV 3.5X10 (GAUZE/BANDAGES/DRESSINGS) ×1 IMPLANT
DURAPREP 26ML APPLICATOR (WOUND CARE) ×1 IMPLANT
ELECT REM PT RETURN 15FT ADLT (MISCELLANEOUS) ×1 IMPLANT
GLOVE BIO SURGEON STRL SZ 6.5 (GLOVE) IMPLANT
GLOVE BIO SURGEON STRL SZ8 (GLOVE) ×1 IMPLANT
GLOVE BIOGEL PI IND STRL 7.0 (GLOVE) ×1 IMPLANT
GLOVE BIOGEL PI IND STRL 8 (GLOVE) ×1 IMPLANT
GOWN STRL REUS W/ TWL LRG LVL3 (GOWN DISPOSABLE) ×1 IMPLANT
HOLDER FOLEY CATH W/STRAP (MISCELLANEOUS) ×1 IMPLANT
IMMOBILIZER KNEE 20 THIGH 36 (SOFTGOODS) ×1 IMPLANT
KIT TURNOVER KIT A (KITS) ×1 IMPLANT
MANIFOLD NEPTUNE II (INSTRUMENTS) ×1 IMPLANT
NS IRRIG 1000ML POUR BTL (IV SOLUTION) ×1 IMPLANT
PACK TOTAL KNEE CUSTOM (KITS) ×1 IMPLANT
PADDING CAST COTTON 6X4 STRL (CAST SUPPLIES) ×2 IMPLANT
PENCIL SMOKE EVACUATOR (MISCELLANEOUS) ×1 IMPLANT
PIN STEINMAN FIXATION KNEE (PIN) IMPLANT
PROTECTOR NERVE ULNAR (MISCELLANEOUS) ×1 IMPLANT
SET HNDPC FAN SPRY TIP SCT (DISPOSABLE) ×1 IMPLANT
SUT MNCRL AB 4-0 PS2 18 (SUTURE) ×1 IMPLANT
SUT VIC AB 2-0 CT1 TAPERPNT 27 (SUTURE) ×3 IMPLANT
SUTURE STRATFX 0 PDS 27 VIOLET (SUTURE) ×1 IMPLANT
TOWEL GREEN STERILE FF (TOWEL DISPOSABLE) ×1 IMPLANT
TRAY FOLEY MTR SLVR 16FR STAT (SET/KITS/TRAYS/PACK) ×1 IMPLANT
TUBE SUCTION HIGH CAP CLEAR NV (SUCTIONS) ×1 IMPLANT
WRAP KNEE MAXI GEL POST OP (GAUZE/BANDAGES/DRESSINGS) ×1 IMPLANT

## 2023-11-10 NOTE — Progress Notes (Signed)
 Orthopedic Tech Progress Note Patient Details:  [name] 01-Feb-1960 [id number]  Patient ID: [name], female   DOB: September 02, 1960, 63 y.o.   MRN: [id number] Removed pt from CPM.  Morna Pink 11/10/2023, 1:20 PM

## 2023-11-10 NOTE — Interval H&P Note (Signed)
 History and Physical Interval Note:  11/10/2023 6:38 AM  [name]  has presented today for surgery, with the diagnosis of Left knee osteoarthritis.  The various methods of treatment have been discussed with the patient and family. After consideration of risks, benefits and other options for treatment, the patient has consented to  Procedure(s): ARTHROPLASTY, KNEE, TOTAL (Left) as a surgical intervention.  The patient's history has been reviewed, patient examined, no change in status, stable for surgery.  I have reviewed the patient's chart and labs.  Questions were answered to the patient's satisfaction.     Dempsey Ronie Fleeger

## 2023-11-10 NOTE — Progress Notes (Signed)
   11/10/23 2359  BiPAP/CPAP/SIPAP  $ Non-Invasive Home Ventilator  Initial  BiPAP/CPAP/SIPAP Pt Type Adult  BiPAP/CPAP/SIPAP DREAMSTATIOND  Mask Type Nasal mask  Dentures removed? Not applicable  Respiratory Rate 18 breaths/min  Patient Home Machine No  Patient Home Mask Yes  Patient Home Tubing No  Auto Titrate Yes  Minimum cmH2O 5 cmH2O  Maximum cmH2O 20 cmH2O  Device Plugged into RED Power Outlet Yes

## 2023-11-10 NOTE — Transfer of Care (Signed)
 Immediate Anesthesia Transfer of Care Note  Patient: [name]  Procedure(s) Performed: ARTHROPLASTY, KNEE, TOTAL (Left: Knee)  Patient Location: PACU  Anesthesia Type:General and Regional  Level of Consciousness: awake, alert , and oriented  Airway & Oxygen Therapy: Patient Spontanous Breathing and Patient connected to face mask oxygen  Post-op Assessment: Report given to RN and Post -op Vital signs reviewed and stable  Post vital signs: Reviewed and stable  Last Vitals:  Vitals Value Taken Time  BP 121/64 11/10/23 08:34  Temp    Pulse 89 11/10/23 08:35  Resp 9 11/10/23 08:35  SpO2 98 % 11/10/23 08:35  Vitals shown include unfiled device data.  Last Pain:  Vitals:   11/10/23 0608  TempSrc: Oral  PainSc: 8          Complications: No notable events documented.

## 2023-11-10 NOTE — Op Note (Signed)
 OPERATIVE REPORT-TOTAL KNEE ARTHROPLASTY   Pre-operative diagnosis- Osteoarthritis  Left knee(s)  Post-operative diagnosis- Osteoarthritis Left knee(s)  Procedure-  Left  Total Knee Arthroplasty  Surgeon- Dempsey GAILS. Nawaf Strange, MD  Assistant- Roxie Mess, PA-C   Anesthesia-  GA combined with regional for post-op pain  EBL-25 ml   Drains None  Tourniquet time-  Total Tourniquet Time Documented: Thigh (Left) - 30 minutes Total: Thigh (Left) - 30 minutes     Complications- None  Condition-PACU - hemodynamically stable.   Brief Clinical Note  [name] is a 63 y.o. year old female with end stage OA of her left knee with progressively worsening pain and dysfunction. She has constant pain, with activity and at rest and significant functional deficits with difficulties even with ADLs. She has had extensive non-op management including analgesics, injections of cortisone and viscosupplements, and home exercise program, but remains in significant pain with significant dysfunction. Radiographs show bone on bone arthritis medial and patellofemoral. She presents now for left Total Knee Arthroplasty.     Procedure in detail---   The patient is brought into the operating room and positioned supine on the operating table. After successful administration of  GA combined with regional for post-op pain,   a tourniquet is placed high on the  Left thigh(s) and the lower extremity is prepped and draped in the usual sterile fashion. Time out is performed by the operating team and then the  Left lower extremity is wrapped in Esmarch, knee flexed and the tourniquet inflated to 300 mmHg.       A midline incision is made with a ten blade through the subcutaneous tissue to the level of the extensor mechanism. A fresh blade is used to make a medial parapatellar arthrotomy. Soft tissue over the proximal medial tibia is subperiosteally elevated to the joint line with a knife and into the semimembranosus  bursa with a Cobb elevator. Soft tissue over the proximal lateral tibia is elevated with attention being paid to avoiding the patellar tendon on the tibial tubercle. The patella is everted, knee flexed 90 degrees and the ACL and PCL are removed. Findings are bone on bone medial and patellofemoral with large global osteophytes        The drill is used to create a starting hole in the distal femur and the canal is thoroughly irrigated with sterile saline to remove the fatty contents. The 5 degree Left  valgus alignment guide is placed into the femoral canal and the distal femoral cutting block is pinned to remove 10 mm off the distal femur. Resection is made with an oscillating saw.      The tibia is subluxed forward and the menisci are removed. The extramedullary alignment guide is placed referencing proximally at the medial aspect of the tibial tubercle and distally along the second metatarsal axis and tibial crest. The block is pinned to remove 2mm off the more deficient medial  side. Resection is made with an oscillating saw. Size 3is the most appropriate size for the tibia and the proximal tibia is prepared with the modular drill and keel punch for that size.      The femoral sizing guide is placed and size 4 is most appropriate. Rotation is marked off the epicondylar axis and confirmed by creating a rectangular flexion gap at 90 degrees. The size 4 cutting block is pinned in this rotation and the anterior, posterior and chamfer cuts are made with the oscillating saw. The intercondylar block is then placed and  that cut is made.      Trial size 3 tibial component, trial size 4 posterior stabilized femur and a 8  mm posterior stabilized rotating platform insert trial is placed. Full extension is achieved with excellent varus/valgus and anterior/posterior balance throughout full range of motion. The patella is everted and thickness measured to be 20  mm. Free hand resection is taken to 12 mm, a 32 template is  placed, lug holes are drilled, trial patella is placed, and it tracks normally. Osteophytes are removed off the posterior femur with the trial in place. All trials are removed and the cut bone surfaces prepared with pulsatile lavage. Cement is mixed and once ready for implantation, the size 3 tibial implant, size  4 posterior stabilized femoral component, and the size 32 patella are cemented in place and the patella is held with the clamp. The trial insert is placed and the knee held in full extension. The Exparel  (20 ml mixed with 60 ml saline) is injected into the extensor mechanism, posterior capsule, medial and lateral gutters and subcutaneous tissues.  All extruded cement is removed and once the cement is hard the permanent 8 mm posterior stabilized rotating platform insert is placed into the tibial tray.      The wound is copiously irrigated with saline solution and the extensor mechanism closed with # 0 Stratofix suture. The tourniquet is released for a total tourniquet time of 30  minutes. Flexion against gravity is 140 degrees and the patella tracks normally. Subcutaneous tissue is closed with 2.0 vicryl and subcuticular with running 4.0 Monocryl. The incision is cleaned and dried and steri-strips and a bulky sterile dressing are applied. The limb is placed into a knee immobilizer and the patient is awakened and transported to recovery in stable condition.      Please note that a surgical assistant was a medical necessity for this procedure in order to perform it in a safe and expeditious manner. Surgical assistant was necessary to retract the ligaments and vital neurovascular structures to prevent injury to them and also necessary for proper positioning of the limb to allow for anatomic placement of the prosthesis.   Dempsey ROCKFORD Antonious Omahoney, MD    11/10/2023, 8:13 AM

## 2023-11-10 NOTE — Anesthesia Procedure Notes (Signed)
 Procedure Name: LMA Insertion Date/Time: 11/10/2023 7:19 AM  Performed by: Shanik Brookshire D, CRNAPre-anesthesia Checklist: Patient identified, Emergency Drugs available, Suction available and Patient being monitored Patient Re-evaluated:Patient Re-evaluated prior to induction Oxygen Delivery Method: Circle system utilized Preoxygenation: Pre-oxygenation with 100% oxygen Induction Type: IV induction Ventilation: Mask ventilation without difficulty LMA: LMA inserted LMA Size: 4.0 Tube type: Oral Number of attempts: 1 Placement Confirmation: positive ETCO2 and breath sounds checked- equal and bilateral Tube secured with: Tape Dental Injury: Teeth and Oropharynx as per pre-operative assessment

## 2023-11-10 NOTE — Evaluation (Signed)
 Physical Therapy Evaluation Patient Details Name: [name] MRN: [id number] DOB: 09-14-1960 Today's Date: 11/10/2023  History of Present Illness  63 yo female s/p L TKA on 11/10/23. PMH: R TKA, back pain, L3-4, 4-5 fusion in 2023, HTN, DM, C-spine arthroplasty.  Clinical Impression  Pt is s/p TKA resulting in the deficits listed below (see PT Problem List).  Pt known to me from prior knee surgery, very pleasant and motivated to work with PT. Pt amb~ 30' with RW,  min assist needed d/t decr quad activation during stance phase likely d/t residual effects of regional anesthesia.   Pt will benefit from acute skilled PT to increase their independence and safety with mobility to allow discharge.          If plan is discharge home, recommend the following: Help with stairs or ramp for entrance;Assist for transportation   Can travel by private vehicle        Equipment Recommendations None recommended by PT  Recommendations for Other Services       Functional Status Assessment Patient has had a recent decline in their functional status and demonstrates the ability to make significant improvements in function in a reasonable and predictable amount of time.     Precautions / Restrictions Precautions Precautions: Fall;Knee Restrictions Weight Bearing Restrictions Per Provider Order: No LLE Weight Bearing Per Provider Order: Weight bearing as tolerated      Mobility  Bed Mobility Overal bed mobility: Needs Assistance Bed Mobility: Supine to Sit           General bed mobility comments: for safety    Transfers Overall transfer level: Needs assistance Equipment used: Rolling walker (2 wheels) Transfers: Sit to/from Stand Sit to Stand: Min assist           General transfer comment: cues for hand placement and LLE position    Ambulation/Gait Ambulation/Gait assistance: Min assist Gait Distance (Feet): 30 Feet Assistive device: Rolling walker (2 wheels) Gait  Pattern/deviations: Step-to pattern, Decreased stance time - left       General Gait Details: verbal and tactile cues for L knee extension in stance, use of UEs to offload d/t L knee instability. assist to stabilize while wt shifting  Stairs            Wheelchair Mobility     Tilt Bed    Modified Rankin (Stroke Patients Only)       Balance Overall balance assessment: No apparent balance deficits (not formally assessed)                                           Pertinent Vitals/Pain Pain Assessment Pain Assessment: No/denies pain    Home Living Family/patient expects to be discharged to:: Private residence Living Arrangements: Spouse/significant other Available Help at Discharge: Family;Available PRN/intermittently Type of Home: House Home Access: Stairs to enter   Entrance Stairs-Number of Steps: 1 & 1 or 6 steps Alternate Level Stairs-Number of Steps: 13 Home Layout: Able to live on main level with bedroom/bathroom;Two level Home Equipment: Agricultural consultant (2 wheels);Rollator (4 wheels);Hand held shower head;BSC/3in1      Prior Function Prior Level of Function : Independent/Modified Independent             Mobility Comments: independent       Extremity/Trunk Assessment   Upper Extremity Assessment Upper Extremity Assessment: Overall WFL for tasks assessed  Lower Extremity Assessment Lower Extremity Assessment: LLE deficits/detail LLE Deficits / Details: ankle WFL, knee extensors 2+/5, ~10-15 degree quad lag, hip flexors 5/5       Communication   Communication Communication: No apparent difficulties    Cognition Arousal: Alert Behavior During Therapy: WFL for tasks assessed/performed   PT - Cognitive impairments: No apparent impairments                         Following commands: Intact       Cueing Cueing Techniques: Verbal cues     General Comments      Exercises Total Joint Exercises Ankle  Circles/Pumps: AROM, 10 reps, Both Quad Sets: AROM, Both, 5 reps   Assessment/Plan    PT Assessment Patient needs continued PT services  PT Problem List Decreased strength;Decreased range of motion;Decreased activity tolerance;Decreased balance;Decreased knowledge of use of DME;Decreased mobility       PT Treatment Interventions DME instruction;Therapeutic exercise;Gait training;Functional mobility training;Therapeutic activities;Patient/family education;Stair training    PT Goals (Current goals can be found in the Care Plan section)  Acute Rehab PT Goals PT Goal Formulation: With patient Time For Goal Achievement: 11/17/23 Potential to Achieve Goals: Good    Frequency 7X/week     Co-evaluation               AM-PAC PT 6 Clicks Mobility  Outcome Measure Help needed turning from your back to your side while in a flat bed without using bedrails?: A Little Help needed moving from lying on your back to sitting on the side of a flat bed without using bedrails?: A Little Help needed moving to and from a bed to a chair (including a wheelchair)?: A Little Help needed standing up from a chair using your arms (e.g., wheelchair or bedside chair)?: A Little Help needed to walk in hospital room?: A Little Help needed climbing 3-5 steps with a railing? : A Lot 6 Click Score: 17    End of Session Equipment Utilized During Treatment: Gait belt Activity Tolerance: Patient tolerated treatment well Patient left: in chair;with call bell/phone within reach;with chair alarm set;with family/visitor present   PT Visit Diagnosis: Other abnormalities of gait and mobility (R26.89)    Time: 8341-8276 PT Time Calculation (min) (ACUTE ONLY): 25 min   Charges:   PT Evaluation $PT Eval Low Complexity: 1 Low PT Treatments $Gait Training: 8-22 mins PT General Charges $$ ACUTE PT VISIT: 1 Visit         Rexene, PT  Acute Rehab Dept [facility])  [phone]  11/10/2023   [facility] 11/10/2023, 5:42 PM

## 2023-11-10 NOTE — Progress Notes (Signed)
 Orthopedic Tech Progress Note Patient Details:  ALYRICA THUROW 1960-11-21 [id number] Applied CPM per order. Will remove at 1 pm.  CPM Left Knee CPM Left Knee: On Left Knee Flexion (Degrees): 40 Left Knee Extension (Degrees): 10  Post Interventions Patient Tolerated: Well Instructions Provided: Adjustment of device, Care of device, Poper ambulation with device Ortho Devices Type of Ortho Device: CPM padding Ortho Device/Splint Location: LLE Ortho Device/Splint Interventions: Ordered, Application, Adjustment   Post Interventions Patient Tolerated: Well Instructions Provided: Adjustment of device, Care of device, Poper ambulation with device  Morna Pink 11/10/2023, 9:05 AM

## 2023-11-10 NOTE — Discharge Instructions (Addendum)
 Dempsey Moan, MD Total Joint Specialist [facility] [place] [address] [place], [place] [address] [phone]  TOTAL KNEE REPLACEMENT POSTOPERATIVE DIRECTIONS    Knee Rehabilitation, Guidelines Following Surgery  Results after knee surgery are often greatly improved when you follow the exercise, range of motion and muscle strengthening exercises prescribed by your doctor. Safety measures are also important to protect the knee from further injury. If any of these exercises cause you to have increased pain or swelling in your knee joint, decrease the amount until you are comfortable again and slowly increase them. If you have problems or questions, call your caregiver or physical therapist for advice.   BLOOD CLOT PREVENTION Take a 10 mg Xarelto  once a day for three weeks following surgery. Then resume taking an 81 mg Aspirin  once a day. You may resume your vitamins/supplements once you have discontinued the Xarelto . Do not take any NSAIDs (Advil , Aleve, Ibuprofen , Meloxicam , etc.) until you have discontinued the Xarelto .   Hold Migraine Injection for 3 weeks.   HOME CARE INSTRUCTIONS  Remove items at home which could result in a fall. This includes throw rugs or furniture in walking pathways.  ICE to the affected knee as much as tolerated. Icing helps control swelling. If the swelling is well controlled you will be more comfortable and rehab easier. Continue to use ice on the knee for pain and swelling from surgery. You may notice swelling that will progress down to the foot and ankle. This is normal after surgery. Elevate the leg when you are not up walking on it.    Continue to use the breathing machine which will help keep your temperature down. It is common for your temperature to cycle up and down following surgery, especially at night when you are not up moving around and exerting yourself. The breathing machine keeps your lungs expanded and your temperature  down. Do not place pillow under the operative knee, focus on keeping the knee straight while resting  DIET You may resume your previous home diet once you are discharged from the hospital.  DRESSING / WOUND CARE / SHOWERING Keep your bulky bandage on for 2 days. On the third post-operative day you may remove the Ace bandage and gauze. There is a waterproof adhesive bandage on your skin which will stay in place until your first follow-up appointment. Once you remove this you will not need to place another bandage You may begin showering 3 days following surgery, but do not submerge the incision under water .  ACTIVITY For the first 5 days, the key is rest and control of pain and swelling Do your home exercises twice a day starting on post-operative day 3. On the days you go to physical therapy, just do the home exercises once that day. You should rest, ice and elevate the leg for 50 minutes out of every hour. Get up and walk/stretch for 10 minutes per hour. After 5 days you can increase your activity slowly as tolerated. Walk with your walker as instructed. Use the walker until you are comfortable transitioning to a cane. Walk with the cane in the opposite hand of the operative leg. You may discontinue the cane once you are comfortable and walking steadily. Avoid periods of inactivity such as sitting longer than an hour when not asleep. This helps prevent blood clots.  You may discontinue the knee immobilizer once you are able to perform a straight leg raise while lying down. You may resume a sexual relationship in  one month or when given the OK by your doctor.  You may return to work once you are cleared by your doctor.  Do not drive a car for 6 weeks or until released by your surgeon.  Do not drive while taking narcotics.  TED HOSE STOCKINGS Wear the elastic stockings on both legs for three weeks following surgery during the day. You may remove them at night for sleeping.  WEIGHT  BEARING Weight bearing as tolerated with assist device (walker, cane, etc) as directed, use it as long as suggested by your surgeon or therapist, typically at least 4-6 weeks.  POSTOPERATIVE CONSTIPATION PROTOCOL Constipation - defined medically as fewer than three stools per week and severe constipation as less than one stool per week.  One of the most common issues patients have following surgery is constipation.  Even if you have a regular bowel pattern at home, your normal regimen is likely to be disrupted due to multiple reasons following surgery.  Combination of anesthesia, postoperative narcotics, change in appetite and fluid intake all can affect your bowels.  In order to avoid complications following surgery, here are some recommendations in order to help you during your recovery period.  Colace (docusate) - Pick up an over-the-counter form of Colace or another stool softener and take twice a day as long as you are requiring postoperative pain medications.  Take with a full glass of water  daily.  If you experience loose stools or diarrhea, hold the colace until you stool forms back up. If your symptoms do not get better within 1 week or if they get worse, check with your doctor. Dulcolax (bisacodyl ) - Pick up over-the-counter and take as directed by the product packaging as needed to assist with the movement of your bowels.  Take with a full glass of water .  Use this product as needed if not relieved by Colace only.  MiraLax  (polyethylene glycol) - Pick up over-the-counter to have on hand. MiraLax  is a solution that will increase the amount of water  in your bowels to assist with bowel movements.  Take as directed and can mix with a glass of water , juice, soda, coffee, or tea. Take if you go more than two days without a movement. Do not use MiraLax  more than once per day. Call your doctor if you are still constipated or irregular after using this medication for 7 days in a row.  If you continue  to have problems with postoperative constipation, please contact the office for further assistance and recommendations.  If you experience the worst abdominal pain ever or develop nausea or vomiting, please contact the office immediatly for further recommendations for treatment.  ITCHING If you experience itching with your medications, try taking only a single pain pill, or even half a pain pill at a time.  You can also use Benadryl  over the counter for itching or also to help with sleep.   MEDICATIONS See your medication summary on the "After Visit Summary" that the nursing staff will review with you prior to discharge.  You may have some home medications which will be placed on hold until you complete the course of blood thinner medication.  It is important for you to complete the blood thinner medication as prescribed by your surgeon.  Continue your approved medications as instructed at time of discharge.  PRECAUTIONS If you experience chest pain or shortness of breath - call 911 immediately for transfer to the hospital emergency department.  If you develop a fever greater that  101 F, purulent drainage from wound, increased redness or drainage from wound, foul odor from the wound/dressing, or calf pain - CONTACT YOUR SURGEON.                                                   FOLLOW-UP APPOINTMENTS Make sure you keep all of your appointments after your operation with your surgeon and caregivers. You should call the office at the above phone number and make an appointment for approximately two weeks after the date of your surgery or on the date instructed by your surgeon outlined in the After Visit Summary.  RANGE OF MOTION AND STRENGTHENING EXERCISES  Rehabilitation of the knee is important following a knee injury or an operation. After just a few days of immobilization, the muscles of the thigh which control the knee become weakened and shrink (atrophy). Knee exercises are designed to build up  the tone and strength of the thigh muscles and to improve knee motion. Often times heat used for twenty to thirty minutes before working out will loosen up your tissues and help with improving the range of motion but do not use heat for the first two weeks following surgery. These exercises can be done on a training (exercise) mat, on the floor, on a table or on a bed. Use what ever works the best and is most comfortable for you Knee exercises include:  Leg Lifts - While your knee is still immobilized in a splint or cast, you can do straight leg raises. Lift the leg to 60 degrees, hold for 3 sec, and slowly lower the leg. Repeat 10-20 times 2-3 times daily. Perform this exercise against resistance later as your knee gets better.  Quad and Hamstring Sets - Tighten up the muscle on the front of the thigh (Quad) and hold for 5-10 sec. Repeat this 10-20 times hourly. Hamstring sets are done by pushing the foot backward against an object and holding for 5-10 sec. Repeat as with quad sets.  Leg Slides: Lying on your back, slowly slide your foot toward your buttocks, bending your knee up off the floor (only go as far as is comfortable). Then slowly slide your foot back down until your leg is flat on the floor again. Angel Wings: Lying on your back spread your legs to the side as far apart as you can without causing discomfort.  A rehabilitation program following serious knee injuries can speed recovery and prevent re-injury in the future due to weakened muscles. Contact your doctor or a physical therapist for more information on knee rehabilitation.   POST-OPERATIVE OPIOID TAPER INSTRUCTIONS: It is important to wean off of your opioid medication as soon as possible. If you do not need pain medication after your surgery it is ok to stop day one. Opioids include: Codeine, Hydrocodone (Norco, Vicodin), Oxycodone (Percocet, oxycontin ) and hydromorphone  amongst others.  Long term and even short term use of opiods can  cause: Increased pain response Dependence Constipation Depression Respiratory depression And more.  Withdrawal symptoms can include Flu like symptoms Nausea, vomiting And more Techniques to manage these symptoms Hydrate well Eat regular healthy meals Stay active Use relaxation techniques(deep breathing, meditating, yoga) Do Not substitute Alcohol  to help with tapering If you have been on opioids for less than two weeks and do not have pain than it is ok to stop all together.  Plan to wean off of opioids This plan should start within one week post op of your joint replacement. Maintain the same interval or time between taking each dose and first decrease the dose.  Cut the total daily intake of opioids by one tablet each day Next start to increase the time between doses. The last dose that should be eliminated is the evening dose.   IF YOU ARE TRANSFERRED TO A SKILLED REHAB FACILITY If the patient is transferred to a skilled rehab facility following release from the hospital, a list of the current medications will be sent to the facility for the patient to continue.  When discharged from the skilled rehab facility, please have the facility set up the patient's Home Health Physical Therapy prior to being released. Also, the skilled facility will be responsible for providing the patient with their medications at time of release from the facility to include their pain medication, the muscle relaxants, and their blood thinner medication. If the patient is still at the rehab facility at time of the two week follow up appointment, the skilled rehab facility will also need to assist the patient in arranging follow up appointment in our office and any transportation needs.  MAKE SURE YOU:  Understand these instructions.  Get help right away if you are not doing well or get worse.   DENTAL ANTIBIOTICS:  In most cases prophylactic antibiotics for Dental procdeures after total joint surgery are  not necessary.  Exceptions are as follows:  1. History of prior total joint infection  2. Severely immunocompromised (Organ Transplant, cancer chemotherapy, Rheumatoid biologic meds such as Humera)  3. Poorly controlled diabetes (A1C &gt; 8.0, blood glucose over 200)  If you have one of these conditions, contact your surgeon for an antibiotic prescription, prior to your dental procedure.    Pick up stool softner and laxative for home use following surgery while on pain medications. Do not submerge incision under water . Please use good hand washing techniques while changing dressing each day. May shower starting three days after surgery. Please use a clean towel to pat the incision dry following showers. Continue to use ice for pain and swelling after surgery. Do not use any lotions or creams on the incision until instructed by your surgeon.   _________________________________________________  Information on my medicine - XARELTO  (Rivaroxaban )  This medication education was reviewed with me or my healthcare representative as part of my discharge preparation.    Why was Xarelto  prescribed for you? Xarelto  was prescribed for you to reduce the risk of blood clots forming after orthopedic surgery. The medical term for these abnormal blood clots is venous thromboembolism (VTE).  What do you need to know about xarelto  ? Take your Xarelto  ONCE DAILY at the same time every day. You may take it either with or without food.  If you have difficulty swallowing the tablet whole, you may crush it and mix in applesauce just prior to taking your dose.  Take Xarelto  exactly as prescribed by your doctor and DO NOT stop taking Xarelto  without talking to the doctor who prescribed the medication.  Stopping without other VTE prevention medication to take the place of Xarelto  may increase your risk of developing a clot.  After discharge, you should have regular check-up appointments with your  healthcare provider that is prescribing your Xarelto .    What do you do if you miss a dose? If you miss a dose, take it as soon as you remember on the same day  then continue your regularly scheduled once daily regimen the next day. Do not take two doses of Xarelto  on the same day.   Important Safety Information A possible side effect of Xarelto  is bleeding. You should call your healthcare provider right away if you experience any of the following: Bleeding from an injury or your nose that does not stop. Unusual colored urine (red or dark brown) or unusual colored stools (red or black). Unusual bruising for unknown reasons. A serious fall or if you hit your head (even if there is no bleeding).  Some medicines may interact with Xarelto  and might increase your risk of bleeding while on Xarelto . To help avoid this, consult your healthcare provider or pharmacist prior to using any new prescription or non-prescription medications, including herbals, vitamins, non-steroidal anti-inflammatory drugs (NSAIDs) and supplements.  This website has more information on Xarelto : www.xarelto .com.

## 2023-11-10 NOTE — Anesthesia Postprocedure Evaluation (Signed)
 Anesthesia Post Note  Patient: [name]  Procedure(s) Performed: ARTHROPLASTY, KNEE, TOTAL (Left: Knee)     Patient location during evaluation: PACU Anesthesia Type: General and Regional Level of consciousness: awake and alert Pain management: pain level controlled Vital Signs Assessment: post-procedure vital signs reviewed and stable Respiratory status: spontaneous breathing, nonlabored ventilation, respiratory function stable and patient connected to nasal cannula oxygen Cardiovascular status: blood pressure returned to baseline and stable Postop Assessment: no apparent nausea or vomiting Anesthetic complications: no   No notable events documented.  Last Vitals:  Vitals:   11/10/23 1045 11/10/23 1105  BP: 137/84 133/67  Pulse: 94 90  Resp: 13 14  Temp:  36.5 C  SpO2: 97% 98%    Last Pain:  Vitals:   11/10/23 1105  TempSrc: Oral  PainSc: 6                  Bardia Wangerin,W. EDMOND

## 2023-11-10 NOTE — Anesthesia Procedure Notes (Signed)
 Anesthesia Regional Block: Adductor canal block   Pre-Anesthetic Checklist: , timeout performed,  Correct Patient, Correct Site, Correct Laterality,  Correct Procedure, Correct Position, site marked,  Risks and benefits discussed,  Pre-op evaluation,  At surgeon's request and post-op pain management  Laterality: Left  Prep: Maximum Sterile Barrier Precautions used, chloraprep       Needles:  Injection technique: Single-shot  Needle Type: Echogenic Stimulator Needle     Needle Length: 9cm  Needle Gauge: 21     Additional Needles:   Procedures:,,,, ultrasound used (permanent image in chart),,    Narrative:  Start time: 11/10/2023 6:46 AM End time: 11/10/2023 6:56 AM Injection made incrementally with aspirations every 5 mL.  Performed by: Personally  Anesthesiologist: Epifanio Fallow, MD

## 2023-11-11 ENCOUNTER — Encounter (HOSPITAL_COMMUNITY): Payer: Self-pay | Admitting: Orthopedic Surgery

## 2023-11-11 ENCOUNTER — Other Ambulatory Visit (HOSPITAL_COMMUNITY): Payer: Self-pay

## 2023-11-11 DIAGNOSIS — M1712 Unilateral primary osteoarthritis, left knee: Secondary | ICD-10-CM | POA: Diagnosis not present

## 2023-11-11 LAB — BASIC METABOLIC PANEL WITH GFR
Anion gap: 8 (ref 5–15)
BUN: 12 mg/dL (ref 8–23)
CO2: 25 mmol/L (ref 22–32)
Calcium: 9 mg/dL (ref 8.9–10.3)
Chloride: 106 mmol/L (ref 98–111)
Creatinine, Ser: 0.6 mg/dL (ref 0.44–1.00)
GFR, Estimated: >60 mL/min (ref >60)
Glucose, Bld: 127 mg/dL — ABNORMAL HIGH (ref 70–99)
Potassium: 3.7 mmol/L (ref 3.5–5.1)
Sodium: 138 mmol/L (ref 135–145)

## 2023-11-11 LAB — CBC
HCT: 42.6 % (ref 36.0–46.0)
Hemoglobin: 13.5 g/dL (ref 12.0–15.0)
MCH: 28 pg (ref 26.0–34.0)
MCHC: 31.7 g/dL (ref 30.0–36.0)
MCV: 88.4 fL (ref 80.0–100.0)
Platelets: 191 K/uL (ref 150–400)
RBC: 4.82 MIL/uL (ref 3.87–5.11)
RDW: 13.2 % (ref 11.5–15.5)
WBC: 9.6 K/uL (ref 4.0–10.5)
nRBC: 0 % (ref 0.0–0.2)

## 2023-11-11 LAB — GLUCOSE, CAPILLARY
Glucose-Capillary: 108 mg/dL — ABNORMAL HIGH (ref 70–99)
Glucose-Capillary: 128 mg/dL — ABNORMAL HIGH (ref 70–99)

## 2023-11-11 MED ORDER — OXYCODONE HCL 5 MG PO TABS: 5.0000 mg | ORAL_TABLET | Freq: Four times a day (QID) | ORAL | 0 refills | Status: DC | PRN

## 2023-11-11 MED ORDER — METHOCARBAMOL 500 MG PO TABS: 500.0000 mg | ORAL_TABLET | Freq: Four times a day (QID) | ORAL | 0 refills | Status: DC | PRN

## 2023-11-11 MED ORDER — METHOCARBAMOL 500 MG PO TABS
500.0000 mg | ORAL_TABLET | Freq: Four times a day (QID) | ORAL | 0 refills | Status: AC | PRN
  Filled 2023-11-11: qty 40, 10d supply, fill #0

## 2023-11-11 MED ORDER — ONDANSETRON HCL 4 MG PO TABS: 4.0000 mg | ORAL_TABLET | Freq: Four times a day (QID) | ORAL | 0 refills | Status: DC | PRN

## 2023-11-11 MED ORDER — OXYCODONE HCL 5 MG PO TABS
5.0000 mg | ORAL_TABLET | Freq: Four times a day (QID) | ORAL | 0 refills | Status: AC | PRN
  Filled 2023-11-11: qty 30, 5d supply, fill #0

## 2023-11-11 MED ORDER — ONDANSETRON HCL 4 MG PO TABS
4.0000 mg | ORAL_TABLET | Freq: Four times a day (QID) | ORAL | 0 refills | Status: AC | PRN
  Filled 2023-11-11: qty 18, 5d supply, fill #0

## 2023-11-11 MED ORDER — RIVAROXABAN 10 MG PO TABS: 10.0000 mg | ORAL_TABLET | Freq: Every day | ORAL | 0 refills | Status: DC

## 2023-11-11 MED ORDER — TRAMADOL HCL 50 MG PO TABS: 50.0000 mg | ORAL_TABLET | Freq: Four times a day (QID) | ORAL | 0 refills | Status: DC | PRN

## 2023-11-11 MED ORDER — TRAMADOL HCL 50 MG PO TABS
50.0000 mg | ORAL_TABLET | Freq: Four times a day (QID) | ORAL | 0 refills | Status: AC | PRN
  Filled 2023-11-11: qty 40, 7d supply, fill #0

## 2023-11-11 MED ORDER — RIVAROXABAN 10 MG PO TABS
10.0000 mg | ORAL_TABLET | Freq: Every day | ORAL | 0 refills | Status: AC
  Filled 2023-11-11: qty 20, 20d supply, fill #0

## 2023-11-11 NOTE — TOC Transition Note (Signed)
 Transition of Care [facility]) - Discharge Note   Patient Details  Name: [name] MRN: [id number] Date of Birth: 1960/04/07  Transition of Care [facility]) CM/SW Contact:  NORMAN ASPEN, LCSW Phone Number: 11/11/2023, 1:58 PM   Clinical Narrative:    Met with pt who confirms she has needed DME in the home.  OPPT already arranged with [facility] [place].)  No further IP CM needs.   Final next level of care: OP Rehab Barriers to Discharge: No Barriers Identified   Patient Goals and CMS Choice Patient states their goals for this hospitalization and ongoing recovery are:: return home          Discharge Placement                       Discharge Plan and Services Additional resources added to the After Visit Summary for                  DME Arranged: N/A DME Agency: NA                  Social Drivers of Health (SDOH) Interventions SDOH Screenings   Food Insecurity: No Food Insecurity (11/10/2023)  Housing: Low Risk  (11/10/2023)  Transportation Needs: No Transportation Needs (11/10/2023)  Utilities: Not At Risk (11/10/2023)  Depression (PHQ2-9): High Risk (03/28/2023)  Social Connections: Socially Integrated (11/10/2023)  Tobacco Use: Low Risk  (11/10/2023)     Readmission Risk Interventions     No data to display

## 2023-11-11 NOTE — Progress Notes (Signed)
 Discharge instructions given to patient and all questions were answered.

## 2023-11-11 NOTE — Progress Notes (Signed)
 Subjective: 1 Day Post-Op Procedure(s) (LRB): ARTHROPLASTY, KNEE, TOTAL (Left) Patient reports pain as moderate.   Patient seen in rounds by Dr. Melodi. Patient is doing well this morning. No issues overnight. Denies chest pain, SOB, or calf pain. Foley catheter to be removed this AM.  We will continue therapy today, ambulated 30' yesterday.   Objective: Vital signs in last 24 hours: Temp:  [97.7 F (36.5 C)-98.2 F (36.8 C)] 97.9 F (36.6 C) (10/21 0617) Pulse Rate:  [67-100] 77 (10/21 0617) Resp:  [12-20] 18 (10/21 0617) BP: (104-160)/(49-90) 160/69 (10/21 0617) SpO2:  [96 %-100 %] 100 % (10/21 0617)  Intake/Output from previous day:  Intake/Output Summary (Last 24 hours) at 11/11/2023 0859 Last data filed at 11/11/2023 0613 Gross per 24 hour  Intake 911.12 ml  Output 4300 ml  Net -3388.88 ml     Intake/Output this shift: No intake/output data recorded.  Labs: Recent Labs    11/11/23 0330  HGB 13.5   Recent Labs    11/11/23 0330  WBC 9.6  RBC 4.82  HCT 42.6  PLT 191   Recent Labs    11/11/23 0330  NA 138  K 3.7  CL 106  CO2 25  BUN 12  CREATININE 0.60  GLUCOSE 127*  CALCIUM  9.0   No results for input(s): LABPT, INR in the last 72 hours.  Exam: General - Patient is Alert and Oriented Extremity - Neurologically intact Neurovascular intact Sensation intact distally Dorsiflexion/Plantar flexion intact Dressing - no drainage Motor Function - intact, moving foot and toes well on exam.   Past Medical History:  Diagnosis Date   Anxiety    Arthritis    Back pain of lumbar region with sciatica    Cancer (HCC)    breast   Depression    Family history of anesthesia complication    grandfather died under anesthesia 70's- had heart issues   H/O carpal tunnel repair 2014   Headache(784.0)    Hyperlipidemia    Hypertension    Hypothyroidism    Medial meniscus tear    PONV (postoperative nausea and vomiting)    Pre-diabetes    Sleep  apnea    cpap since 10    Assessment/Plan: 1 Day Post-Op Procedure(s) (LRB): ARTHROPLASTY, KNEE, TOTAL (Left) Principal Problem:   OA (osteoarthritis) of knee Active Problems:   Primary osteoarthritis of left knee  Estimated body mass index is 36.76 kg/m as calculated from the following:   Height as of this encounter: 5' 2 (1.575 m).   Weight as of this encounter: 91.2 kg. Advance diet   Patient's anticipated LOS is less than 2 midnights, meeting these requirements: - Younger than 66 - Lives within 1 hour of care - Has a competent adult at home to recover with post-op recover - NO history of  - Chronic pain requiring opioids  - Coronary Artery Disease  - Heart failure  - Heart attack  - Stroke  - DVT/VTE  - Cardiac arrhythmia  - Respiratory Failure/COPD  - Renal failure  - Anemia  - Advanced Liver disease     DVT Prophylaxis - Xarelto  Weight bearing as tolerated. Continue therapy.  Plan is to go Home after hospital stay. Plan for discharge later today if progresses with therapy and meeting goals. Scheduled for OPPT at [facility]. Follow-up in the office in 2 weeks.  The PDMP database was reviewed today prior to any opioid medications being prescribed to this patient.  Corean Sender,  PA-C Orthopedic Surgery [phone] 11/11/2023, 8:59 AM

## 2023-11-11 NOTE — Progress Notes (Signed)
 PT TX NOTE   11/11/23 1600  PT Visit Information  Last PT Received On 11/11/23  History of Present Illness 63 yo female s/p L TKA on 11/10/23.  PMH: R TKA, back pain, L3-4, 4-5 fusion in 2023, HTN, DM, C-spine arthroplasty.  Pt progressing very well, meeting PT goals and is ready to d/c from PT standpoint with spouse assisting as needed. Anticipate continue progress in next venue.    Precautions  Precautions Fall;Knee  Recall of Precautions/Restrictions Intact  Restrictions  Weight Bearing Restrictions Per Provider Order No  LLE Weight Bearing Per Provider Order WBAT  Pain Assessment  Pain Assessment 0-10  Pain Score 4  Pain Location L knee  Pain Descriptors / Indicators Discomfort;Grimacing;Guarding  Pain Intervention(s) Limited activity within patient's tolerance;Monitored during session;Premedicated before session;Repositioned  Cognition  Arousal Alert  Behavior During Therapy WFL for tasks assessed/performed  PT - Cognitive impairments No apparent impairments  Following Commands  Following commands Intact  Cueing  Cueing Techniques Verbal cues  Communication  Communication No apparent difficulties  Bed Mobility  General bed mobility comments pt in recliner  Transfers  Overall transfer level Needs assistance  Equipment used Rolling walker (2 wheels)  Transfers Sit to/from Stand  Sit to Stand Contact guard assist;Supervision  General transfer comment cues for hand placement and LLE position- pt demonstrates carryover from prior session, spouse present and able to cue as well  Ambulation/Gait  Ambulation/Gait assistance Contact guard assist;Supervision  Gait Distance (Feet) 100 Feet  Assistive device Rolling walker (2 wheels)  Gait Pattern/deviations Step-to pattern;Decreased stance time - left  General Gait Details cues for sequence and RW position; progression to beginning step through pattern with no knee instability noted  Gait velocity decr  Stairs Yes  Stairs  assistance Min assist  Stair Management Step to pattern;Forwards;With walker  Number of Stairs 3  General stair comments cues for sequence and RW technique; assist to manage RW; spouse present and abel to cue/assist as needed  Balance  Sitting-balance support No upper extremity supported;Feet supported  Sitting balance-Leahy Scale Good  Standing balance support No upper extremity supported;During functional activity;Reliant on assistive device for balance  Standing balance-Leahy Scale Fair  PT - End of Session  Equipment Utilized During Treatment Gait belt  Activity Tolerance Patient tolerated treatment well  Patient left in chair;with call bell/phone within reach;with chair alarm set;with family/visitor present   PT - Assessment/Plan  PT Visit Diagnosis Other abnormalities of gait and mobility (R26.89)  PT Frequency (ACUTE ONLY) 7X/week  Follow Up Recommendations Follow physician's recommendations for discharge plan and follow up therapies  Patient can return home with the following Help with stairs or ramp for entrance;Assist for transportation  PT equipment None recommended by PT  AM-PAC PT 6 Clicks Mobility Outcome Measure (Version 2)  Help needed turning from your back to your side while in a flat bed without using bedrails? 3  Help needed moving from lying on your back to sitting on the side of a flat bed without using bedrails? 4  Help needed moving to and from a bed to a chair (including a wheelchair)? 3  Help needed standing up from a chair using your arms (e.g., wheelchair or bedside chair)? 3  Help needed to walk in hospital room? 3  Help needed climbing 3-5 steps with a railing?  3  6 Click Score 19  Consider Recommendation of Discharge To: Home with [facility]  PT Goal Progression  Progress towards PT goals Progressing toward goals  Acute  Rehab PT Goals  PT Goal Formulation With patient  Time For Goal Achievement 11/17/23  Potential to Achieve Goals Good  PT Time Calculation   PT Start Time (ACUTE ONLY) 1525  PT Stop Time (ACUTE ONLY) 1549  PT Time Calculation (min) (ACUTE ONLY) 24 min  PT General Charges  $$ ACUTE PT VISIT 1 Visit  PT Treatments  $Gait Training 23-37 mins

## 2023-11-11 NOTE — Progress Notes (Signed)
 Physical Therapy Treatment Patient Details Name: [name] MRN: [id number] DOB: [birth year] Today's Date: 11/11/2023   History of Present Illness 63 yo female s/p L TKA on 11/10/23. PMH: R TKA, back pain, L3-4, 4-5 fusion in 2023, HTN, DM, C-spine arthroplasty.    PT Comments  Pt is making excellent progress this session. Very motivated to work with PT. L knee AAROM ~ -6 to 75 degrees flexion. Anticipate pt will be ready to d/c after afternoon PT. Pt spouse present    If plan is discharge home, recommend the following: Help with stairs or ramp for entrance;Assist for transportation   Can travel by private vehicle        Equipment Recommendations  None recommended by PT    Recommendations for Other Services       Precautions / Restrictions Precautions Precautions: Fall;Knee Recall of Precautions/Restrictions: Intact Restrictions Weight Bearing Restrictions Per Provider Order: No LLE Weight Bearing Per Provider Order: Weight bearing as tolerated     Mobility  Bed Mobility Overal bed mobility: Needs Assistance Bed Mobility: Supine to Sit     Supine to sit: Supervision     General bed mobility comments: for safety    Transfers Overall transfer level: Needs assistance Equipment used: Rolling walker (2 wheels) Transfers: Sit to/from Stand Sit to Stand: Contact guard assist           General transfer comment: cues for hand placement and LLE position    Ambulation/Gait Ambulation/Gait assistance: Contact guard assist Gait Distance (Feet): 120 Feet (10' more) Assistive device: Rolling walker (2 wheels) Gait Pattern/deviations: Step-to pattern, Decreased stance time - left Gait velocity: decr     General Gait Details: cues for sequence and step to pattern d/t slight left knee buckling--knee stability improved with distance, no LOB   Stairs             Wheelchair Mobility     Tilt Bed    Modified Rankin (Stroke Patients Only)        Balance   Sitting-balance support: No upper extremity supported, Feet supported Sitting balance-Leahy Scale: Good     Standing balance support: No upper extremity supported, During functional activity, Reliant on assistive device for balance Standing balance-Leahy Scale: Fair                              Hotel manager: No apparent difficulties  Cognition Arousal: Alert Behavior During Therapy: WFL for tasks assessed/performed   PT - Cognitive impairments: No apparent impairments                         Following commands: Intact      Cueing Cueing Techniques: Verbal cues  Exercises Total Joint Exercises Ankle Circles/Pumps: AROM, 10 reps, Both Quad Sets: AROM, Both, 5 reps Heel Slides: AAROM, Left, 5 reps Straight Leg Raises: AROM, AAROM, Left, 5 reps    General Comments        Pertinent Vitals/Pain Pain Assessment Pain Assessment: 0-10 Pain Score: 5  Pain Location: L knee Pain Descriptors / Indicators: Discomfort, Grimacing, Guarding Pain Intervention(s): Limited activity within patient's tolerance, Monitored during session, Premedicated before session, Repositioned (called for ice to L knee)    Home Living                          Prior Function  PT Goals (current goals can now be found in the care plan section) Acute Rehab PT Goals PT Goal Formulation: With patient Time For Goal Achievement: 11/17/23 Potential to Achieve Goals: Good Progress towards PT goals: Progressing toward goals    Frequency    7X/week      PT Plan      Co-evaluation              AM-PAC PT 6 Clicks Mobility   Outcome Measure  Help needed turning from your back to your side while in a flat bed without using bedrails?: A Little Help needed moving from lying on your back to sitting on the side of a flat bed without using bedrails?: None Help needed moving to and from a bed to a chair (including  a wheelchair)?: A Little Help needed standing up from a chair using your arms (e.g., wheelchair or bedside chair)?: A Little Help needed to walk in hospital room?: A Little Help needed climbing 3-5 steps with a railing? : A Little 6 Click Score: 19    End of Session Equipment Utilized During Treatment: Gait belt Activity Tolerance: Patient tolerated treatment well Patient left: in chair;with call bell/phone within reach;with chair alarm set   PT Visit Diagnosis: Other abnormalities of gait and mobility (R26.89)     Time: 8972-8948 PT Time Calculation (min) (ACUTE ONLY): 24 min  Charges:    $Gait Training: 23-37 mins PT General Charges $$ ACUTE PT VISIT: 1 Visit                     Khyli Swaim, PT  Acute Rehab Dept [facility]) [phone]  11/11/2023    [facility] 11/11/2023, 11:33 AM

## 2023-11-12 NOTE — Discharge Summary (Signed)
 Patient ID: [name] MRN: [id number] DOB/AGE: 29-Apr-1960 63 y.o.  Admit date: 11/10/2023 Discharge date: 11/11/2023  Admission Diagnoses:  Principal Problem:   OA (osteoarthritis) of knee Active Problems:   Primary osteoarthritis of left knee   Discharge Diagnoses:  Same  Past Medical History:  Diagnosis Date   Anxiety    Arthritis    Back pain of lumbar region with sciatica    Cancer (HCC)    breast   Depression    Family history of anesthesia complication    grandfather died under anesthesia 70's- had heart issues   H/O carpal tunnel repair 2014   Headache(784.0)    Hyperlipidemia    Hypertension    Hypothyroidism    Medial meniscus tear    PONV (postoperative nausea and vomiting)    Pre-diabetes    Sleep apnea    cpap since 10    Surgeries: Procedure(s): ARTHROPLASTY, KNEE, TOTAL on 11/10/2023   Consultants:   Discharged Condition: Improved  Hospital Course: [name] is an 63 y.o. female who was admitted 11/10/2023 for operative treatment ofOA (osteoarthritis) of knee. Patient has severe unremitting pain that affects sleep, daily activities, and work/hobbies. After pre-op clearance the patient was taken to the operating room on 11/10/2023 and underwent  Procedure(s): ARTHROPLASTY, KNEE, TOTAL.    Patient was given perioperative antibiotics:  Anti-infectives (From admission, onward)    Start     Dose/Rate Route Frequency Ordered Stop   11/10/23 1400  ceFAZolin  (ANCEF ) IVPB 2g/100 mL premix        2 g 200 mL/hr over 30 Minutes Intravenous Every 6 hours 11/10/23 1123 11/10/23 2117   11/10/23 0600  ceFAZolin  (ANCEF ) IVPB 2g/100 mL premix        2 g 200 mL/hr over 30 Minutes Intravenous On call to O.R. 11/10/23 0540 11/10/23 0720        Patient was given sequential compression devices, early ambulation, and chemoprophylaxis to prevent DVT.  Patient benefited maximally from hospital stay and there were no complications.    Recent vital  signs: Patient Vitals for the past 24 hrs:  BP Temp Temp src Pulse Resp SpO2  11/11/23 1306 (!) 162/62 97.9 F (36.6 C) Oral 99 16 96 %     Recent laboratory studies:  Recent Labs    11/11/23 0330  WBC 9.6  HGB 13.5  HCT 42.6  PLT 191  NA 138  K 3.7  CL 106  CO2 25  BUN 12  CREATININE 0.60  GLUCOSE 127*  CALCIUM  9.0     Discharge Medications:   Allergies as of 11/11/2023       Reactions   American Cockroach Itching   Cat Dander Itching, Other (See Comments)   Nasal congestion,   Effexor [venlafaxine Hcl]    Weight loss and cant sleep   Effexor [venlafaxine] Other (See Comments)   Other Reaction(s): anxiety/ extreme weight loss   Linzess [linaclotide] Diarrhea   Molds & Smuts Other (See Comments)        Medication List     STOP taking these medications    aspirin  EC 81 MG tablet   Galcanezumab-gnlm 120 MG/ML Soaj   meloxicam  7.5 MG tablet Commonly known as: MOBIC    naproxen sodium 220 MG tablet Commonly known as: ALEVE   tiZANidine  4 MG tablet Commonly known as: ZANAFLEX        TAKE these medications    ALPRAZolam  0.5 MG tablet Commonly known as: XANAX  Take 1/2-1 tablet three times daily  as needed for anxiety or insomnia   atorvastatin  20 MG tablet Commonly known as: LIPITOR TAKE 1 TABLET BY MOUTH EVERY DAY   Azelastine HCl 137 MCG/SPRAY Soln Place 2 sprays into both nostrils 2 (two) times daily.   BOTOX IM Inject 1 Dose into the muscle every 3 (three) months. FOR MIGRAINES   DayVigo  10 MG Tabs Generic drug: Lemborexant  Take 1 tablet (10 mg total) by mouth at bedtime.   doxazosin  4 MG tablet Commonly known as: CARDURA  Take 1 tablet (4 mg total) by mouth at bedtime.   EPINEPHrine  0.3 mg/0.3 mL Soaj injection Commonly known as: EPI-PEN Inject 0.3 mg into the muscle as needed for anaphylaxis.   famotidine -calcium  carbonate-magnesium  hydroxide 10-800-165 MG chewable tablet Commonly known as: PEPCID  COMPLETE Chew 1 tablet by mouth  daily as needed (with aleve for pain.).   gabapentin  100 MG capsule Commonly known as: NEURONTIN  Take 1 capsule (100 mg total) by mouth 2 (two) times daily as needed (anxiety). What changed:  how much to take when to take this   gabapentin  300 MG capsule Commonly known as: NEURONTIN  Take 1 capsule (300 mg total) by mouth at bedtime. What changed: Another medication with the same name was changed. Make sure you understand how and when to take each.   GOODSENSE PSYLLIUM FIBER PO Take 10 mLs by mouth daily. 11.6 grams of fiber   hydrochlorothiazide  12.5 MG capsule Commonly known as: MICROZIDE  Take 12.5 mg by mouth in the morning.   Jardiance  10 MG Tabs tablet Generic drug: empagliflozin  Take 10 mg by mouth in the morning.   losartan 50 MG tablet Commonly known as: COZAAR Take 50 mg by mouth daily.   metFORMIN  500 MG tablet Commonly known as: GLUCOPHAGE  Take 500 mg by mouth daily with supper.   methocarbamol  500 MG tablet Commonly known as: ROBAXIN  Take 1 tablet (500 mg total) by mouth every 6 (six) hours as needed for muscle spasms.   mometasone 0.1 % lotion Commonly known as: ELOCON 5 drops daily. Instill 5 drops into right ear daily   montelukast 10 MG tablet Commonly known as: SINGULAIR Take 10 mg by mouth every other day.   ondansetron  4 MG tablet Commonly known as: ZOFRAN  Take 1 tablet (4 mg total) by mouth every 6 (six) hours as needed for nausea.   Opcon-A  0.027-0.315 % Soln Generic drug: Naphazoline-Pheniramine Place 1-2 drops into both eyes 3 (three) times daily as needed (irritated/allergy eyes).   oxyCODONE  5 MG immediate release tablet Commonly known as: Oxy IR/ROXICODONE  Take 1-2 tablets (5-10 mg total) by mouth every 6 (six) hours as needed for severe pain (pain score 7-10) (pain score 4-6).   Ozempic (0.25 or 0.5 MG/DOSE) 2 MG/3ML Sopn Generic drug: Semaglutide(0.25 or 0.5MG /DOS) Inject 0.5 mg as directed once a week.   PRESCRIPTION  MEDICATION once a week. Allergy injection   rizatriptan  10 MG disintegrating tablet Commonly known as: MAXALT -MLT Take 10 mg by mouth as needed for migraine.   thyroid  30 MG tablet Commonly known as: ARMOUR Take 30 mg by mouth daily before breakfast.   traMADol  50 MG tablet Commonly known as: ULTRAM  Take 1-2 tablets (50-100 mg total) by mouth every 6 (six) hours as needed for moderate pain (pain score 4-6).   Ubrelvy  50 MG Tabs Generic drug: Ubrogepant  Take 50 mg by mouth daily as needed (migraine).   Vilazodone  HCl 20 MG Tabs Take 1.5 tablets (30 mg total) by mouth daily with breakfast.   VISINE OP Place 1 drop  into both eyes daily as needed (irritation).   Xarelto  10 MG Tabs tablet Generic drug: rivaroxaban  Take 1 tablet (10 mg total) by mouth daily with breakfast for 20 days. Take one tablet Xarelto  once a day for three weeks following surgery.  Then change to one baby Aspirin  (81 mg) once a day for three weeks.  Then discontinue Aspirin .               Discharge Care Instructions  (From admission, onward)           Start     Ordered   11/11/23 0000  Weight bearing as tolerated        11/11/23 1019   11/11/23 0000  Change dressing       Comments: You may remove the bulky bandage (ACE wrap and gauze) two days after surgery. You will have an adhesive waterproof bandage underneath. Leave this in place until your first follow-up appointment.   11/11/23 1019            Diagnostic Studies: No results found.  Disposition: Discharge disposition: 01-Home or Self Care       Discharge Instructions     Call MD / Call 911   Complete by: As directed    If you experience chest pain or shortness of breath, CALL 911 and be transported to the hospital emergency room.  If you develope a fever above 101 F, pus (white drainage) or increased drainage or redness at the wound, or calf pain, call your surgeon's office.   Change dressing   Complete by: As directed     You may remove the bulky bandage (ACE wrap and gauze) two days after surgery. You will have an adhesive waterproof bandage underneath. Leave this in place until your first follow-up appointment.   Constipation Prevention   Complete by: As directed    Drink plenty of fluids.  Prune juice may be helpful.  You may use a stool softener, such as Colace (over the counter) 100 mg twice a day.  Use MiraLax  (over the counter) for constipation as needed.   Diet - low sodium heart healthy   Complete by: As directed    Do not put a pillow under the knee. Place it under the heel.   Complete by: As directed    Driving restrictions   Complete by: As directed    No driving for two weeks   Post-operative opioid taper instructions:   Complete by: As directed    POST-OPERATIVE OPIOID TAPER INSTRUCTIONS: It is important to wean off of your opioid medication as soon as possible. If you do not need pain medication after your surgery it is ok to stop day one. Opioids include: Codeine, Hydrocodone (Norco, Vicodin), Oxycodone (Percocet, oxycontin ) and hydromorphone  amongst others.  Long term and even short term use of opiods can cause: Increased pain response Dependence Constipation Depression Respiratory depression And more.  Withdrawal symptoms can include Flu like symptoms Nausea, vomiting And more Techniques to manage these symptoms Hydrate well Eat regular healthy meals Stay active Use relaxation techniques(deep breathing, meditating, yoga) Do Not substitute Alcohol  to help with tapering If you have been on opioids for less than two weeks and do not have pain than it is ok to stop all together.  Plan to wean off of opioids This plan should start within one week post op of your joint replacement. Maintain the same interval or time between taking each dose and first decrease the dose.  Cut the total daily intake  of opioids by one tablet each day Next start to increase the time between doses. The  last dose that should be eliminated is the evening dose.      TED hose   Complete by: As directed    Use stockings (TED hose) for three weeks on both leg(s).  You may remove them at night for sleeping.   Weight bearing as tolerated   Complete by: As directed         Follow-up Information     Aluisio, Dempsey, MD Follow up in 2 week(s).   Specialty: Orthopedic Surgery Contact information: [address] [place] [address] [phone]                  Signed: Roxie Mess 11/12/2023, 7:49 AM

## 2023-11-12 NOTE — Therapy (Signed)
 OUTPATIENT PHYSICAL THERAPY LOWER EXTREMITY EVALUATION   Patient Name: [name] MRN: [id number] DOB:1960/09/06, 63 y.o., female Today's Date: 11/13/2023  END OF SESSION:  PT End of Session - 11/13/23 1522     Visit Number 1    Date for Recertification  02/13/24    Authorization Type [employer]    PT Start Time 1320    PT Stop Time 1415    PT Time Calculation (min) 55 min    Activity Tolerance Patient tolerated treatment well;Patient limited by pain    Behavior During Therapy WFL for tasks assessed/performed          Past Medical History:  Diagnosis Date   Anxiety    Arthritis    Back pain of lumbar region with sciatica    Cancer (HCC)    breast   Depression    Family history of anesthesia complication    grandfather died under anesthesia 70's- had heart issues   H/O carpal tunnel repair 2014   Headache(784.0)    Hyperlipidemia    Hypertension    Hypothyroidism    Medial meniscus tear    PONV (postoperative nausea and vomiting)    Pre-diabetes    Sleep apnea    cpap since 10   Past Surgical History:  Procedure Laterality Date   BILATERAL TOTAL MASTECTOMY WITH AXILLARY LYMPH NODE DISSECTION     BREAST RECONSTRUCTION Left 07/17/2023   for encapsulation,  done at [facility]   CERVICAL DISC ARTHROPLASTY N/A 03/19/2013   Procedure: CERVICAL FIVE TO SIX, CERVICAL SIX TO SEVEN CERVICAL ANTERIOR DISC ARTHROPLASTY;  Surgeon: Victory Gens, MD;  Location: [facility];  Service: Neurosurgery;  Laterality: N/A;  C56 C67 artificial disc replacement   CESAREAN SECTION     COLONOSCOPY     DILATATION & CURETTAGE/HYSTEROSCOPY WITH TRUECLEAR N/A 11/25/2013   Procedure: DILATATION & CURETTAGE/HYSTEROSCOPY WITH TRUCLEAR;  Surgeon: Charlie CHRISTELLA Croak, MD;  Location: [facility];  Service: Gynecology;  Laterality: N/A;   ECTOPIC PREGNANCY SURGERY     GALLBLADDER SURGERY     MOUTH SURGERY     child- dog bite   SPINAL FUSION     2023   STERIOD INJECTION Left 09/09/2022   Procedure: LEFT  KNEE STEROID INJECTION;  Surgeon: Melodi Lerner, MD;  Location: [facility];  Service: Orthopedics;  Laterality: Left;  left knee injection 0928   TONSILLECTOMY     TOTAL KNEE ARTHROPLASTY Right 09/09/2022   Procedure: TOTAL KNEE ARTHROPLASTY;  Surgeon: Melodi Lerner, MD;  Location: [facility];  Service: Orthopedics;  Laterality: Right;   TOTAL KNEE ARTHROPLASTY Left 11/10/2023   Procedure: ARTHROPLASTY, KNEE, TOTAL;  Surgeon: Melodi Lerner, MD;  Location: [facility];  Service: Orthopedics;  Laterality: Left;   TUBAL LIGATION     Patient Active Problem List   Diagnosis Date Noted   Primary osteoarthritis of left knee 11/10/2023   OA (osteoarthritis) of knee 09/09/2022   Exertional dyspnea 04/17/2022   Elevated coronary artery calcium  score 04/17/2022   Spondylolisthesis at L4-L5 level 12/27/2021   Ductal carcinoma in situ (DCIS) of left breast 01/19/2018   Generalized anxiety disorder 11/07/2017   Depression 11/07/2017   Insomnia 11/07/2017   Cervical spondylosis 03/19/2013    PCP: Onita, MD  REFERRING PROVIDER: Bethanie, PA  REFERRING DIAG: s/p left TKA  THERAPY DIAG:  Acute pain of left knee  Stiffness of left knee, not elsewhere classified  Localized edema  Difficulty in walking, not elsewhere classified  Rationale for Evaluation and Treatment: Rehabilitation  ONSET  DATE: 11/10/23  SUBJECTIVE:   SUBJECTIVE STATEMENT: Patient underwent a left TKA on 11/10/23, she reports that she is doing okay, reports that she had noodle leg, reports trying to do the exercises daily  PERTINENT HISTORY: 63 yo female s/p L TKA on 11/10/23. PMH: R TKA, back pain, L3-4, 4-5 fusion in 2023, HTN, DM, C-spine arthroplasty  PAIN:  Are you having pain? Yes: NPRS scale: 9/10 Pain location: left knee Pain description: ache, sharpe , tender Aggravating factors: moving, walking , bending, I can't straighten the leg pain up to 10/10 Relieving factors: pain meds, ice, rest, elevation at best pain a  7/10  PRECAUTIONS: Other: back surgery in the past  RED FLAGS: None   WEIGHT BEARING RESTRICTIONS: No  FALLS:  Has patient fallen in last 6 months? No  LIVING ENVIRONMENT: Lives with: lives with their family Lives in: House/apartment Stairs: Yes: Internal: 20 steps; can reach both Has following equipment at home: Single point cane, Walker - 2 wheeled, shower chair, and Grab bars  OCCUPATION: volunteers  PLOF: Independent and no device was doing yoga  PATIENT GOALS: walk without device, have no pain, normal ROM, go up and down stairs step over step  NEXT MD VISIT: November 3  OBJECTIVE:  Note: Objective measures were completed at Evaluation unless otherwise noted.  DIAGNOSTIC FINDINGS: none  COGNITION: Overall cognitive status: Within functional limits for tasks assessed     SENSATION: WFL  EDEMA:  Circumferential: right mid patella 44cm, left 49 cm  MUSCLE LENGTH: Not tested  POSTURE: rounded shoulders, forward head, and knee guarded   PALPATION: Very sore and tender, some bruising in the thigh, very red in the medial knee with heat  LOWER EXTREMITY ROM:  Active ROM LEFT AROM 11/13/23 eval Left PROM 11/13/23 eval  Hip flexion    Hip extension    Hip abduction    Hip adduction    Hip internal rotation    Hip external rotation    Knee flexion 80 85  Knee extension 25 20  Ankle dorsiflexion    Ankle plantarflexion    Ankle inversion    Ankle eversion     (Blank rows = not tested)  LOWER EXTREMITY MMT:  MMT Right eval Left eval  Hip flexion    Hip extension    Hip abduction    Hip adduction    Hip internal rotation    Hip external rotation    Knee flexion    Knee extension    Ankle dorsiflexion    Ankle plantarflexion    Ankle inversion    Ankle eversion     (Blank rows = not tested)  FUNCTIONAL TESTS:  5 times sit to stand: 35 seconds Timed up and go (TUG): 56 seconds with FWW  GAIT: Distance walked: 60 feet Assistive device  utilized: Walker - 2 wheeled Level of assistance: CGA Comments: step to pattern, shortened stance phase on the left  TREATMENT DATE:  11/13/23 Evaluation SAQ Vaso Medium pressure 37 degrees left knee     PATIENT EDUCATION:  Education details: reviewed RICE, gait, HEP Person educated: Patient Education method: Explanation, Demonstration, Tactile cues, Verbal cues, and Handouts Education comprehension: verbalized understanding, returned demonstration, verbal cues required, and tactile cues required  HOME EXERCISE PROGRAM: QS, chair scoots for flexion, ankle pumps, SLR, LAQ  ASSESSMENT:  CLINICAL IMPRESSION: Patient is a 63 y.o. female who was seen today for physical therapy evaluation and treatment for s/p left TKA on 11/10/23.  She is in a lot pain, has significant tenderness, a lot of redness and warmth in the medial knee.  Reports that she cannot raise the foot up without help but with encouragement she could, was able to do a SAQ.  TUG time was 56 seconds.   Has a lot of edema.   OBJECTIVE IMPAIRMENTS: Abnormal gait, cardiopulmonary status limiting activity, decreased activity tolerance, decreased balance, decreased coordination, decreased endurance, decreased mobility, difficulty walking, decreased ROM, decreased strength, increased edema, increased muscle spasms, impaired flexibility, and pain.    REHAB POTENTIAL: Good  CLINICAL DECISION MAKING: Evolving/moderate complexity  EVALUATION COMPLEXITY: Moderate   GOALS: Goals reviewed with patient? Yes  SHORT TERM GOALS: Target date: 12/06/23 Independent with initial HEP Baseline: Goal status: INITIAL  LONG TERM GOALS: Target date: 04/12/24  Independent with advanced HEP Baseline:  Goal status: INITIAL  2.  Walk with LRAD all distances with minimal deviations Baseline:  Goal status:  INITIAL  3.  Increase AROM of the left knee to 0-120 degrees flexion Baseline:  Goal status: INITIAL  4.  Decrease pain 50% Baseline:  Goal status: INITIAL  5.  Decrease TUG time to 18 seconds Baseline:  Goal status: INITIAL  6.  Go up and down stairs step over step Baseline:  Goal status: INITIAL   PLAN:  PT FREQUENCY: 3x/week to start and then will decrease as she improves  PT DURATION: 12 weeks  PLANNED INTERVENTIONS: 97164- PT Re-evaluation, 97110-Therapeutic exercises, 97530- Therapeutic activity, 97112- Neuromuscular re-education, 97535- Self Care, [id number]- Manual therapy, [phone]- Gait training, [phone]- Electrical stimulation (unattended), 97016- Vasopneumatic device, Patient/Family education, Balance training, Stair training, Joint mobilization, and Cryotherapy  PLAN FOR NEXT SESSION: work on motion, mm activation and walking   [employer], PT 11/13/2023, 3:22 PM

## 2023-11-13 ENCOUNTER — Ambulatory Visit: Admitting: Physical Therapy

## 2023-11-13 ENCOUNTER — Encounter: Payer: Self-pay | Admitting: Physical Therapy

## 2023-11-13 DIAGNOSIS — M549 Dorsalgia, unspecified: Secondary | ICD-10-CM | POA: Diagnosis not present

## 2023-11-13 DIAGNOSIS — R262 Difficulty in walking, not elsewhere classified: Secondary | ICD-10-CM

## 2023-11-13 DIAGNOSIS — M25562 Pain in left knee: Secondary | ICD-10-CM

## 2023-11-13 DIAGNOSIS — R600 Localized edema: Secondary | ICD-10-CM

## 2023-11-13 DIAGNOSIS — M25662 Stiffness of left knee, not elsewhere classified: Secondary | ICD-10-CM

## 2023-11-17 ENCOUNTER — Ambulatory Visit: Admitting: Physical Therapy

## 2023-11-17 ENCOUNTER — Encounter: Payer: Self-pay | Admitting: Physical Therapy

## 2023-11-17 DIAGNOSIS — G8929 Other chronic pain: Secondary | ICD-10-CM

## 2023-11-17 DIAGNOSIS — M25562 Pain in left knee: Secondary | ICD-10-CM

## 2023-11-17 DIAGNOSIS — R600 Localized edema: Secondary | ICD-10-CM

## 2023-11-17 DIAGNOSIS — M549 Dorsalgia, unspecified: Secondary | ICD-10-CM | POA: Diagnosis not present

## 2023-11-17 DIAGNOSIS — R262 Difficulty in walking, not elsewhere classified: Secondary | ICD-10-CM

## 2023-11-17 DIAGNOSIS — M25662 Stiffness of left knee, not elsewhere classified: Secondary | ICD-10-CM

## 2023-11-17 NOTE — Therapy (Signed)
 OUTPATIENT PHYSICAL THERAPY LOWER EXTREMITY EVALUATION   Patient Name: [name] MRN: [id number] DOB:Feb 26, 1960, 63 y.o., female Today's Date: 11/17/2023  END OF SESSION:  PT End of Session - 11/17/23 1620     Visit Number 2    Date for Recertification  02/13/24    Authorization Type U[employer]    PT Start Time 1618    PT Stop Time 1720    PT Time Calculation (min) 62 min    Activity Tolerance Patient tolerated treatment well;Patient limited by pain    Behavior During Therapy [facility] for tasks assessed/performed          Past Medical History:  Diagnosis Date   Anxiety    Arthritis    Back pain of lumbar region with sciatica    Cancer (HCC)    breast   Depression    Family history of anesthesia complication    grandfather died under anesthesia 70's- had heart issues   H/O carpal tunnel repair 2014   Headache(784.0)    Hyperlipidemia    Hypertension    Hypothyroidism    Medial meniscus tear    PONV (postoperative nausea and vomiting)    Pre-diabetes    Sleep apnea    cpap since 10   Past Surgical History:  Procedure Laterality Date   BILATERAL TOTAL MASTECTOMY WITH AXILLARY LYMPH NODE DISSECTION     BREAST RECONSTRUCTION Left 07/17/2023   for encapsulation,  done at [facility]   CERVICAL DISC ARTHROPLASTY N/A 03/19/2013   Procedure: CERVICAL FIVE TO SIX, CERVICAL SIX TO SEVEN CERVICAL ANTERIOR DISC ARTHROPLASTY;  Surgeon: Victory Gens, MD;  Location: [facility];  Service: Neurosurgery;  Laterality: N/A;  C56 C67 artificial disc replacement   CESAREAN SECTION     COLONOSCOPY     DILATATION & CURETTAGE/HYSTEROSCOPY WITH TRUECLEAR N/A 11/25/2013   Procedure: DILATATION & CURETTAGE/HYSTEROSCOPY WITH TRUCLEAR;  Surgeon: Charlie CHRISTELLA Croak, MD;  Location: [facility];  Service: Gynecology;  Laterality: N/A;   ECTOPIC PREGNANCY SURGERY     GALLBLADDER SURGERY     MOUTH SURGERY     child- dog bite   SPINAL FUSION     2023   STERIOD INJECTION Left 09/09/2022   Procedure: LEFT  KNEE STEROID INJECTION;  Surgeon: Melodi Lerner, MD;  Location: [facility];  Service: Orthopedics;  Laterality: Left;  left knee injection 0928   TONSILLECTOMY     TOTAL KNEE ARTHROPLASTY Right 09/09/2022   Procedure: TOTAL KNEE ARTHROPLASTY;  Surgeon: Melodi Lerner, MD;  Location: [facility];  Service: Orthopedics;  Laterality: Right;   TOTAL KNEE ARTHROPLASTY Left 11/10/2023   Procedure: ARTHROPLASTY, KNEE, TOTAL;  Surgeon: Melodi Lerner, MD;  Location: [facility];  Service: Orthopedics;  Laterality: Left;   TUBAL LIGATION     Patient Active Problem List   Diagnosis Date Noted   Primary osteoarthritis of left knee 11/10/2023   OA (osteoarthritis) of knee 09/09/2022   Exertional dyspnea 04/17/2022   Elevated coronary artery calcium  score 04/17/2022   Spondylolisthesis at L4-L5 level 12/27/2021   Ductal carcinoma in situ (DCIS) of left breast 01/19/2018   Generalized anxiety disorder 11/07/2017   Depression 11/07/2017   Insomnia 11/07/2017   Cervical spondylosis 03/19/2013    PCP: Onita, MD  REFERRING PROVIDER: Bethanie, PA  REFERRING DIAG: s/p left TKA  THERAPY DIAG:  Acute pain of left knee  Stiffness of left knee, not elsewhere classified  Localized edema  Difficulty in walking, not elsewhere classified  Chronic bilateral back pain, unspecified back location  Rationale for Evaluation and Treatment: Rehabilitation  ONSET DATE: 11/10/23  SUBJECTIVE:   SUBJECTIVE STATEMENT: Patient was in the car for 2 hours prior to coming in, She reports being very stiff  Patient underwent a left TKA on 11/10/23, she reports that she is doing okay, reports that she had noodle leg, reports trying to do the exercises daily  PERTINENT HISTORY: 63 yo female s/p L TKA on 11/10/23. PMH: R TKA, back pain, L3-4, 4-5 fusion in 2023, HTN, DM, C-spine arthroplasty  PAIN:  Are you having pain? Yes: NPRS scale: 9/10 Pain location: left knee Pain description: ache, sharpe , tender Aggravating  factors: moving, walking , bending, I can't straighten the leg pain up to 10/10 Relieving factors: pain meds, ice, rest, elevation at best pain a 7/10  PRECAUTIONS: Other: back surgery in the past  RED FLAGS: None   WEIGHT BEARING RESTRICTIONS: No  FALLS:  Has patient fallen in last 6 months? No  LIVING ENVIRONMENT: Lives with: lives with their family Lives in: House/apartment Stairs: Yes: Internal: 20 steps; can reach both Has following equipment at home: Single point cane, Walker - 2 wheeled, shower chair, and Grab bars  OCCUPATION: volunteers  PLOF: Independent and no device was doing yoga  PATIENT GOALS: walk without device, have no pain, normal ROM, go up and down stairs step over step  NEXT MD VISIT: November 3  OBJECTIVE:  Note: Objective measures were completed at Evaluation unless otherwise noted.  DIAGNOSTIC FINDINGS: none  COGNITION: Overall cognitive status: Within functional limits for tasks assessed     SENSATION: WFL  EDEMA:  Circumferential: right mid patella 44cm, left 49 cm  MUSCLE LENGTH: Not tested  POSTURE: rounded shoulders, forward head, and knee guarded   PALPATION: Very sore and tender, some bruising in the thigh, very red in the medial knee with heat  LOWER EXTREMITY ROM:  Active ROM LEFT AROM 11/13/23 eval Left PROM 11/13/23 eval  Hip flexion    Hip extension    Hip abduction    Hip adduction    Hip internal rotation    Hip external rotation    Knee flexion 80 85  Knee extension 25 20  Ankle dorsiflexion    Ankle plantarflexion    Ankle inversion    Ankle eversion     (Blank rows = not tested)  LOWER EXTREMITY MMT:  MMT Right eval Left eval  Hip flexion    Hip extension    Hip abduction    Hip adduction    Hip internal rotation    Hip external rotation    Knee flexion    Knee extension    Ankle dorsiflexion    Ankle plantarflexion    Ankle inversion    Ankle eversion     (Blank rows = not  tested)  FUNCTIONAL TESTS:  5 times sit to stand: 35 seconds Timed up and go (TUG): 56 seconds with FWW  GAIT: Distance walked: 60 feet Assistive device utilized: Walker - 2 wheeled Level of assistance: CGA Comments: step to pattern, shortened stance phase on the left  TREATMENT DATE:  11/17/23 Nustep level 5 x 5 minutes STM to the quad and scar very gentle Gentle scar and patellar mobs 6 toe clears 6 lunge stretch 20# HS curls 2.5# LAQ Gait with walker, verbal and tactile cues to bend knee at toe off Some walking with HHA and walking with SPC again cues needed Feet on ball K2C, bridge Vaso medium pressure 37 degrees in elevation  11/13/23 Evaluation SAQ Vaso Medium pressure 37 degrees left knee     PATIENT EDUCATION:  Education details: reviewed RICE, gait, HEP Person educated: Patient Education method: Explanation, Demonstration, Tactile cues, Verbal cues, and Handouts Education comprehension: verbalized understanding, returned demonstration, verbal cues required, and tactile cues required  HOME EXERCISE PROGRAM: QS, chair scoots for flexion, ankle pumps, SLR, LAQ  ASSESSMENT:  CLINICAL IMPRESSION: Patient very stiff and swollen, reports has been sitting in the car for an MD appointment in [place]  She reports that she is having trouble bending when walking and also difficulty raising the leg up without hands, in clinic she did well with some encouragement,    Patient is a 63 y.o. female who was seen today for physical therapy evaluation and treatment for s/p left TKA on 11/10/23.  She is in a lot pain, has significant tenderness, a lot of redness and warmth in the medial knee.  Reports that she cannot raise the foot up without help but with encouragement she could, was able to do a SAQ.  TUG time was 56 seconds.   Has a lot of edema.    OBJECTIVE IMPAIRMENTS: Abnormal gait, cardiopulmonary status limiting activity, decreased activity tolerance, decreased balance, decreased coordination, decreased endurance, decreased mobility, difficulty walking, decreased ROM, decreased strength, increased edema, increased muscle spasms, impaired flexibility, and pain.    REHAB POTENTIAL: Good  CLINICAL DECISION MAKING: Evolving/moderate complexity  EVALUATION COMPLEXITY: Moderate   GOALS: Goals reviewed with patient? Yes  SHORT TERM GOALS: Target date: 12/06/23 Independent with initial HEP Baseline: Goal status: INITIAL  LONG TERM GOALS: Target date: 04/12/24  Independent with advanced HEP Baseline:  Goal status: INITIAL  2.  Walk with LRAD all distances with minimal deviations Baseline:  Goal status: INITIAL  3.  Increase AROM of the left knee to 0-120 degrees flexion Baseline:  Goal status: INITIAL  4.  Decrease pain 50% Baseline:  Goal status: INITIAL  5.  Decrease TUG time to 18 seconds Baseline:  Goal status: INITIAL  6.  Go up and down stairs step over step Baseline:  Goal status: INITIAL   PLAN:  PT FREQUENCY: 3x/week to start and then will decrease as she improves  PT DURATION: 12 weeks  PLANNED INTERVENTIONS: 97164- PT Re-evaluation, 97110-Therapeutic exercises, 97530- Therapeutic activity, 97112- Neuromuscular re-education, 97535- Self Care, [id number]- Manual therapy, [phone]- Gait training, [phone]- Electrical stimulation (unattended), 97016- Vasopneumatic device, Patient/Family education, Balance training, Stair training, Joint mobilization, and Cryotherapy  PLAN FOR NEXT SESSION: work on motion, mm activation and walking   [employer], PT 11/17/2023, 4:25 PM

## 2023-11-19 ENCOUNTER — Encounter: Payer: Self-pay | Admitting: Physical Therapy

## 2023-11-19 ENCOUNTER — Ambulatory Visit: Admitting: Physical Therapy

## 2023-11-19 DIAGNOSIS — M25562 Pain in left knee: Secondary | ICD-10-CM

## 2023-11-19 DIAGNOSIS — G8929 Other chronic pain: Secondary | ICD-10-CM

## 2023-11-19 DIAGNOSIS — M25662 Stiffness of left knee, not elsewhere classified: Secondary | ICD-10-CM

## 2023-11-19 DIAGNOSIS — M549 Dorsalgia, unspecified: Secondary | ICD-10-CM | POA: Diagnosis not present

## 2023-11-19 DIAGNOSIS — R600 Localized edema: Secondary | ICD-10-CM

## 2023-11-19 DIAGNOSIS — R262 Difficulty in walking, not elsewhere classified: Secondary | ICD-10-CM

## 2023-11-19 NOTE — Therapy (Signed)
 OUTPATIENT PHYSICAL THERAPY LOWER EXTREMITY TREATMENT   Patient Name: [name] MRN: [id number] DOB:Sep 06, 1960, 63 y.o., female Today's Date: 11/19/2023  END OF SESSION:  PT End of Session - 11/19/23 1541     Visit Number 3    Date for Recertification  02/13/24    Authorization Type [employer]    PT Start Time 1530    PT Stop Time 1630    PT Time Calculation (min) 60 min    Activity Tolerance Patient tolerated treatment well;Patient limited by pain    Behavior During Therapy [facility] for tasks assessed/performed          Past Medical History:  Diagnosis Date   Anxiety    Arthritis    Back pain of lumbar region with sciatica    Cancer (HCC)    breast   Depression    Family history of anesthesia complication    grandfather died under anesthesia 70's- had heart issues   H/O carpal tunnel repair 2014   Headache(784.0)    Hyperlipidemia    Hypertension    Hypothyroidism    Medial meniscus tear    PONV (postoperative nausea and vomiting)    Pre-diabetes    Sleep apnea    cpap since 10   Past Surgical History:  Procedure Laterality Date   BILATERAL TOTAL MASTECTOMY WITH AXILLARY LYMPH NODE DISSECTION     BREAST RECONSTRUCTION Left 07/17/2023   for encapsulation,  done at [facility]   CERVICAL DISC ARTHROPLASTY N/A 03/19/2013   Procedure: CERVICAL FIVE TO SIX, CERVICAL SIX TO SEVEN CERVICAL ANTERIOR DISC ARTHROPLASTY;  Surgeon: Victory Gens, MD;  Location: [facility];  Service: Neurosurgery;  Laterality: N/A;  C56 C67 artificial disc replacement   CESAREAN SECTION     COLONOSCOPY     DILATATION & CURETTAGE/HYSTEROSCOPY WITH TRUECLEAR N/A 11/25/2013   Procedure: DILATATION & CURETTAGE/HYSTEROSCOPY WITH TRUCLEAR;  Surgeon: Charlie CHRISTELLA Croak, MD;  Location: [facility];  Service: Gynecology;  Laterality: N/A;   ECTOPIC PREGNANCY SURGERY     GALLBLADDER SURGERY     MOUTH SURGERY     child- dog bite   SPINAL FUSION     2023   STERIOD INJECTION Left 09/09/2022   Procedure: LEFT KNEE  STEROID INJECTION;  Surgeon: Melodi Lerner, MD;  Location: [facility];  Service: Orthopedics;  Laterality: Left;  left knee injection 0928   TONSILLECTOMY     TOTAL KNEE ARTHROPLASTY Right 09/09/2022   Procedure: TOTAL KNEE ARTHROPLASTY;  Surgeon: Melodi Lerner, MD;  Location: [facility];  Service: Orthopedics;  Laterality: Right;   TOTAL KNEE ARTHROPLASTY Left 11/10/2023   Procedure: ARTHROPLASTY, KNEE, TOTAL;  Surgeon: Melodi Lerner, MD;  Location: [facility];  Service: Orthopedics;  Laterality: Left;   TUBAL LIGATION     Patient Active Problem List   Diagnosis Date Noted   Primary osteoarthritis of left knee 11/10/2023   OA (osteoarthritis) of knee 09/09/2022   Exertional dyspnea 04/17/2022   Elevated coronary artery calcium  score 04/17/2022   Spondylolisthesis at L4-L5 level 12/27/2021   Ductal carcinoma in situ (DCIS) of left breast 01/19/2018   Generalized anxiety disorder 11/07/2017   Depression 11/07/2017   Insomnia 11/07/2017   Cervical spondylosis 03/19/2013    PCP: Onita, MD  REFERRING PROVIDER: Bethanie, PA  REFERRING DIAG: s/p left TKA  THERAPY DIAG:  Acute pain of left knee  Stiffness of left knee, not elsewhere classified  Localized edema  Difficulty in walking, not elsewhere classified  Chronic bilateral back pain, unspecified back location  Rationale for Evaluation and Treatment: Rehabilitation  ONSET DATE: 11/10/23  SUBJECTIVE:   SUBJECTIVE STATEMENT: Doing okay , she is still rating pain a 7/10  Patient underwent a left TKA on 11/10/23, she reports that she is doing okay, reports that she had noodle leg, reports trying to do the exercises daily  PERTINENT HISTORY: 63 yo female s/p L TKA on 11/10/23. PMH: R TKA, back pain, L3-4, 4-5 fusion in 2023, HTN, DM, C-spine arthroplasty  PAIN:  Are you having pain? Yes: NPRS scale: 9/10 Pain location: left knee Pain description: ache, sharpe , tender Aggravating factors: moving, walking , bending, I can't  straighten the leg pain up to 10/10 Relieving factors: pain meds, ice, rest, elevation at best pain a 7/10  PRECAUTIONS: Other: back surgery in the past  RED FLAGS: None   WEIGHT BEARING RESTRICTIONS: No  FALLS:  Has patient fallen in last 6 months? No  LIVING ENVIRONMENT: Lives with: lives with their family Lives in: House/apartment Stairs: Yes: Internal: 20 steps; can reach both Has following equipment at home: Single point cane, Walker - 2 wheeled, shower chair, and Grab bars  OCCUPATION: volunteers  PLOF: Independent and no device was doing yoga  PATIENT GOALS: walk without device, have no pain, normal ROM, go up and down stairs step over step  NEXT MD VISIT: November 3  OBJECTIVE:  Note: Objective measures were completed at Evaluation unless otherwise noted.  DIAGNOSTIC FINDINGS: none  COGNITION: Overall cognitive status: Within functional limits for tasks assessed     SENSATION: WFL  EDEMA:  Circumferential: right mid patella 44cm, left 49 cm  MUSCLE LENGTH: Not tested  POSTURE: rounded shoulders, forward head, and knee guarded   PALPATION: Very sore and tender, some bruising in the thigh, very red in the medial knee with heat  LOWER EXTREMITY ROM:  Active ROM LEFT AROM 11/13/23 eval Left PROM 11/13/23 eval  Hip flexion    Hip extension    Hip abduction    Hip adduction    Hip internal rotation    Hip external rotation    Knee flexion 80 85  Knee extension 25 20  Ankle dorsiflexion    Ankle plantarflexion    Ankle inversion    Ankle eversion     (Blank rows = not tested)  LOWER EXTREMITY MMT:  MMT Right eval Left eval  Hip flexion    Hip extension    Hip abduction    Hip adduction    Hip internal rotation    Hip external rotation    Knee flexion    Knee extension    Ankle dorsiflexion    Ankle plantarflexion    Ankle inversion    Ankle eversion     (Blank rows = not tested)  FUNCTIONAL TESTS:  5 times sit to stand: 35  seconds Timed up and go (TUG): 56 seconds with FWW  GAIT: Distance walked: 60 feet Assistive device utilized: Walker - 2 wheeled Level of assistance: CGA Comments: step to pattern, shortened stance phase on the left  TREATMENT DATE:  11/19/23 Nustep level 3 x 6 minutes Stairs step over step 4and 6 going up step over step Second step lunge stretch Bike partial revs x 4 minutes 20# HS curls Gait with SPC, cues verbal CGA Passive stretch left knee, gentle STM to the left thigh, scar mobs Vaso left knee 34 degrees   11/17/23 Nustep level 5 x 5 minutes STM to the quad and scar very gentle Gentle scar and patellar mobs 6 toe clears 6 lunge stretch 20# HS curls 2.5# LAQ Gait with walker, verbal and tactile cues to bend knee at toe off Some walking with HHA and walking with SPC again cues needed Feet on ball K2C, bridge Vaso medium pressure 37 degrees in elevation  11/13/23 Evaluation SAQ Vaso Medium pressure 37 degrees left knee     PATIENT EDUCATION:  Education details: reviewed RICE, gait, HEP Person educated: Patient Education method: Explanation, Demonstration, Tactile cues, Verbal cues, and Handouts Education comprehension: verbalized understanding, returned demonstration, verbal cues required, and tactile cues required  HOME EXERCISE PROGRAM: QS, chair scoots for flexion, ankle pumps, SLR, LAQ  ASSESSMENT:  CLINICAL IMPRESSION: Patient very stiff and swollen, WE continue to add to her exercises and function, tried stairs step over step going up.  She is really doing well with her ROM, a little stiff with her gait, still high pain levels  Patient is a 63 y.o. female who was seen today for physical therapy evaluation and treatment for s/p left TKA on 11/10/23.  She is in a lot pain, has significant tenderness, a lot of redness and  warmth in the medial knee.  Reports that she cannot raise the foot up without help but with encouragement she could, was able to do a SAQ.  TUG time was 56 seconds.   Has a lot of edema.   OBJECTIVE IMPAIRMENTS: Abnormal gait, cardiopulmonary status limiting activity, decreased activity tolerance, decreased balance, decreased coordination, decreased endurance, decreased mobility, difficulty walking, decreased ROM, decreased strength, increased edema, increased muscle spasms, impaired flexibility, and pain.    REHAB POTENTIAL: Good  CLINICAL DECISION MAKING: Evolving/moderate complexity  EVALUATION COMPLEXITY: Moderate   GOALS: Goals reviewed with patient? Yes  SHORT TERM GOALS: Target date: 12/06/23 Independent with initial HEP Baseline: Goal status: met 11/19/23  LONG TERM GOALS: Target date: 04/12/24  Independent with advanced HEP Baseline:  Goal status: INITIAL  2.  Walk with LRAD all distances with minimal deviations Baseline:  Goal status: INITIAL  3.  Increase AROM of the left knee to 0-120 degrees flexion Baseline:  Goal status: INITIAL  4.  Decrease pain 50% Baseline:  Goal status: INITIAL  5.  Decrease TUG time to 18 seconds Baseline:  Goal status: INITIAL  6.  Go up and down stairs step over step Baseline:  Goal status: INITIAL   PLAN:  PT FREQUENCY: 3x/week to start and then will decrease as she improves  PT DURATION: 12 weeks  PLANNED INTERVENTIONS: 97164- PT Re-evaluation, 97110-Therapeutic exercises, 97530- Therapeutic activity, 97112- Neuromuscular re-education, 97535- Self Care, [id number]- Manual therapy, [phone]- Gait training, [phone]- Electrical stimulation (unattended), 97016- Vasopneumatic device, Patient/Family education, Balance training, Stair training, Joint mobilization, and Cryotherapy  PLAN FOR NEXT SESSION: work on motion, mm activation and walking   LIBERTY MEDIA, PT 11/19/2023, 3:42 PM

## 2023-11-20 ENCOUNTER — Ambulatory Visit: Admitting: Physical Therapy

## 2023-11-20 ENCOUNTER — Encounter: Payer: Self-pay | Admitting: Physical Therapy

## 2023-11-20 DIAGNOSIS — R262 Difficulty in walking, not elsewhere classified: Secondary | ICD-10-CM

## 2023-11-20 DIAGNOSIS — M25662 Stiffness of left knee, not elsewhere classified: Secondary | ICD-10-CM

## 2023-11-20 DIAGNOSIS — R600 Localized edema: Secondary | ICD-10-CM

## 2023-11-20 DIAGNOSIS — M549 Dorsalgia, unspecified: Secondary | ICD-10-CM | POA: Diagnosis not present

## 2023-11-20 DIAGNOSIS — M25562 Pain in left knee: Secondary | ICD-10-CM

## 2023-11-20 NOTE — Therapy (Signed)
 OUTPATIENT PHYSICAL THERAPY LOWER EXTREMITY TREATMENT   Patient Name: [name] MRN: [id number] DOB:03-03-1960, 63 y.o., female Today's Date: 11/20/2023  END OF SESSION:  PT End of Session - 11/20/23 1534     Visit Number 4    Date for Recertification  02/13/24    Authorization Type U[employer]    PT Start Time 1529    PT Stop Time 1730    PT Time Calculation (min) 121 min    Activity Tolerance Patient tolerated treatment well;Patient limited by pain    Behavior During Therapy [facility] for tasks assessed/performed          Past Medical History:  Diagnosis Date   Anxiety    Arthritis    Back pain of lumbar region with sciatica    Cancer (HCC)    breast   Depression    Family history of anesthesia complication    grandfather died under anesthesia 70's- had heart issues   H/O carpal tunnel repair 2014   Headache(784.0)    Hyperlipidemia    Hypertension    Hypothyroidism    Medial meniscus tear    PONV (postoperative nausea and vomiting)    Pre-diabetes    Sleep apnea    cpap since 10   Past Surgical History:  Procedure Laterality Date   BILATERAL TOTAL MASTECTOMY WITH AXILLARY LYMPH NODE DISSECTION     BREAST RECONSTRUCTION Left 07/17/2023   for encapsulation,  done at [facility]   CERVICAL DISC ARTHROPLASTY N/A 03/19/2013   Procedure: CERVICAL FIVE TO SIX, CERVICAL SIX TO SEVEN CERVICAL ANTERIOR DISC ARTHROPLASTY;  Surgeon: Victory Gens, MD;  Location: [facility];  Service: Neurosurgery;  Laterality: N/A;  C56 C67 artificial disc replacement   CESAREAN SECTION     COLONOSCOPY     DILATATION & CURETTAGE/HYSTEROSCOPY WITH TRUECLEAR N/A 11/25/2013   Procedure: DILATATION & CURETTAGE/HYSTEROSCOPY WITH TRUCLEAR;  Surgeon: Charlie CHRISTELLA Croak, MD;  Location: [facility];  Service: Gynecology;  Laterality: N/A;   ECTOPIC PREGNANCY SURGERY     GALLBLADDER SURGERY     MOUTH SURGERY     child- dog bite   SPINAL FUSION     2023   STERIOD INJECTION Left 09/09/2022   Procedure: LEFT  KNEE STEROID INJECTION;  Surgeon: Melodi Lerner, MD;  Location: [facility];  Service: Orthopedics;  Laterality: Left;  left knee injection 0928   TONSILLECTOMY     TOTAL KNEE ARTHROPLASTY Right 09/09/2022   Procedure: TOTAL KNEE ARTHROPLASTY;  Surgeon: Melodi Lerner, MD;  Location: [facility];  Service: Orthopedics;  Laterality: Right;   TOTAL KNEE ARTHROPLASTY Left 11/10/2023   Procedure: ARTHROPLASTY, KNEE, TOTAL;  Surgeon: Melodi Lerner, MD;  Location: [facility];  Service: Orthopedics;  Laterality: Left;   TUBAL LIGATION     Patient Active Problem List   Diagnosis Date Noted   Primary osteoarthritis of left knee 11/10/2023   OA (osteoarthritis) of knee 09/09/2022   Exertional dyspnea 04/17/2022   Elevated coronary artery calcium  score 04/17/2022   Spondylolisthesis at L4-L5 level 12/27/2021   Ductal carcinoma in situ (DCIS) of left breast 01/19/2018   Generalized anxiety disorder 11/07/2017   Depression 11/07/2017   Insomnia 11/07/2017   Cervical spondylosis 03/19/2013    PCP: Onita, MD  REFERRING PROVIDER: Bethanie, PA  REFERRING DIAG: s/p left TKA  THERAPY DIAG:  Acute pain of left knee  Stiffness of left knee, not elsewhere classified  Localized edema  Difficulty in walking, not elsewhere classified  Rationale for Evaluation and Treatment: Rehabilitation  ONSET  DATE: 11/10/23  SUBJECTIVE:   SUBJECTIVE STATEMENT: I don't know what is going on, my knee is so stiff  Patient underwent a left TKA on 11/10/23, she reports that she is doing okay, reports that she had noodle leg, reports trying to do the exercises daily  PERTINENT HISTORY: 63 yo female s/p L TKA on 11/10/23. PMH: R TKA, back pain, L3-4, 4-5 fusion in 2023, HTN, DM, C-spine arthroplasty  PAIN:  Are you having pain? Yes: NPRS scale: 9/10 Pain location: left knee Pain description: ache, sharpe , tender Aggravating factors: moving, walking , bending, I can't straighten the leg pain up to 10/10 Relieving  factors: pain meds, ice, rest, elevation at best pain a 7/10  PRECAUTIONS: Other: back surgery in the past  RED FLAGS: None   WEIGHT BEARING RESTRICTIONS: No  FALLS:  Has patient fallen in last 6 months? No  LIVING ENVIRONMENT: Lives with: lives with their family Lives in: House/apartment Stairs: Yes: Internal: 20 steps; can reach both Has following equipment at home: Single point cane, Walker - 2 wheeled, shower chair, and Grab bars  OCCUPATION: volunteers  PLOF: Independent and no device was doing yoga  PATIENT GOALS: walk without device, have no pain, normal ROM, go up and down stairs step over step  NEXT MD VISIT: November 3  OBJECTIVE:  Note: Objective measures were completed at Evaluation unless otherwise noted.  DIAGNOSTIC FINDINGS: none  COGNITION: Overall cognitive status: Within functional limits for tasks assessed     SENSATION: WFL  EDEMA:  Circumferential: right mid patella 44cm, left 49 cm  MUSCLE LENGTH: Not tested  POSTURE: rounded shoulders, forward head, and knee guarded   PALPATION: Very sore and tender, some bruising in the thigh, very red in the medial knee with heat  LOWER EXTREMITY ROM:  Active ROM LEFT AROM 11/13/23 eval Left PROM 11/13/23 eval  Hip flexion    Hip extension    Hip abduction    Hip adduction    Hip internal rotation    Hip external rotation    Knee flexion 80 85  Knee extension 25 20  Ankle dorsiflexion    Ankle plantarflexion    Ankle inversion    Ankle eversion     (Blank rows = not tested)  LOWER EXTREMITY MMT:  MMT Right eval Left eval  Hip flexion    Hip extension    Hip abduction    Hip adduction    Hip internal rotation    Hip external rotation    Knee flexion    Knee extension    Ankle dorsiflexion    Ankle plantarflexion    Ankle inversion    Ankle eversion     (Blank rows = not tested)  FUNCTIONAL TESTS:  5 times sit to stand: 35 seconds Timed up and go (TUG): 56 seconds with  FWW  GAIT: Distance walked: 60 feet Assistive device utilized: Walker - 2 wheeled Level of assistance: CGA Comments: step to pattern, shortened stance phase on the left  TREATMENT DATE:  11/20/23 6 toe clears Step lunge strech 20# HS curls, then left only 10# Nustep level 5 x 5 minutes Bike partial revs Gait with cues to bend knee and having her exaggerate the bend at toe off Feet on ball K2C, bridge SAQ Passive left knee stretch Vaso medium pressure 34 degrees  11/19/23 Nustep level 3 x 6 minutes Stairs step over step 4and 6 going up step over step Second step lunge stretch Bike partial revs x 4 minutes 20# HS curls Gait with SPC, cues verbal CGA Passive stretch left knee, gentle STM to the left thigh, scar mobs Vaso left knee 34 degrees   11/17/23 Nustep level 5 x 5 minutes STM to the quad and scar very gentle Gentle scar and patellar mobs 6 toe clears 6 lunge stretch 20# HS curls 2.5# LAQ Gait with walker, verbal and tactile cues to bend knee at toe off Some walking with HHA and walking with SPC again cues needed Feet on ball K2C, bridge Vaso medium pressure 37 degrees in elevation  11/13/23 Evaluation SAQ Vaso Medium pressure 37 degrees left knee     PATIENT EDUCATION:  Education details: reviewed RICE, gait, HEP Person educated: Patient Education method: Explanation, Demonstration, Tactile cues, Verbal cues, and Handouts Education comprehension: verbalized understanding, returned demonstration, verbal cues required, and tactile cues required  HOME EXERCISE PROGRAM: QS, chair scoots for flexion, ankle pumps, SLR, LAQ  ASSESSMENT:  CLINICAL IMPRESSION: Patient very stiff worried about the knee, we started out and had her relax and do lunge stretch, she got to where the knee was moving well, just needs cue to bend at toe  off, has good quads.  Really emphasized how to keep it bending over the weekend. Patient is a 63 y.o. female who was seen today for physical therapy evaluation and treatment for s/p left TKA on 11/10/23.  She is in a lot pain, has significant tenderness, a lot of redness and warmth in the medial knee.  Reports that she cannot raise the foot up without help but with encouragement she could, was able to do a SAQ.  TUG time was 56 seconds.   Has a lot of edema.   OBJECTIVE IMPAIRMENTS: Abnormal gait, cardiopulmonary status limiting activity, decreased activity tolerance, decreased balance, decreased coordination, decreased endurance, decreased mobility, difficulty walking, decreased ROM, decreased strength, increased edema, increased muscle spasms, impaired flexibility, and pain.    REHAB POTENTIAL: Good  CLINICAL DECISION MAKING: Evolving/moderate complexity  EVALUATION COMPLEXITY: Moderate   GOALS: Goals reviewed with patient? Yes  SHORT TERM GOALS: Target date: 12/06/23 Independent with initial HEP Baseline: Goal status: met 11/19/23  LONG TERM GOALS: Target date: 04/12/24  Independent with advanced HEP Baseline:  Goal status: INITIAL  2.  Walk with LRAD all distances with minimal deviations Baseline:  Goal status: INITIAL  3.  Increase AROM of the left knee to 0-120 degrees flexion Baseline:  Goal status: INITIAL  4.  Decrease pain 50% Baseline:  Goal status: INITIAL  5.  Decrease TUG time to 18 seconds Baseline:  Goal status: INITIAL  6.  Go up and down stairs step over step Baseline:  Goal status: INITIAL   PLAN:  PT FREQUENCY: 3x/week to start and then will decrease as she improves  PT DURATION: 12 weeks  PLANNED INTERVENTIONS: 97164- PT Re-evaluation, 97110-Therapeutic exercises, 97530- Therapeutic activity, 97112- Neuromuscular re-education, 97535- Self Care, 02859- Manual therapy, [id number]- Gait training, [phone]- Electrical stimulation (unattended), [phone]-  Vasopneumatic device, Patient/Family education, Balance training, Stair  training, Joint mobilization, and Cryotherapy  PLAN FOR NEXT SESSION: work on motion, mm activation and walking   Khy Pitre W, PT 11/20/2023, 3:36 PM

## 2023-11-24 ENCOUNTER — Encounter: Payer: Self-pay | Admitting: Physical Therapy

## 2023-11-24 ENCOUNTER — Ambulatory Visit: Attending: Physician Assistant | Admitting: Physical Therapy

## 2023-11-24 DIAGNOSIS — R262 Difficulty in walking, not elsewhere classified: Secondary | ICD-10-CM | POA: Insufficient documentation

## 2023-11-24 DIAGNOSIS — M25662 Stiffness of left knee, not elsewhere classified: Secondary | ICD-10-CM | POA: Diagnosis present

## 2023-11-24 DIAGNOSIS — G8929 Other chronic pain: Secondary | ICD-10-CM | POA: Diagnosis present

## 2023-11-24 DIAGNOSIS — R600 Localized edema: Secondary | ICD-10-CM | POA: Insufficient documentation

## 2023-11-24 DIAGNOSIS — M25562 Pain in left knee: Secondary | ICD-10-CM | POA: Insufficient documentation

## 2023-11-24 DIAGNOSIS — M549 Dorsalgia, unspecified: Secondary | ICD-10-CM | POA: Diagnosis present

## 2023-11-24 NOTE — Therapy (Signed)
 OUTPATIENT PHYSICAL THERAPY LOWER EXTREMITY TREATMENT   Patient Name: [name] MRN: [id number] DOB:05/16/1960, 63 y.o., female Today's Date: 11/24/2023  END OF SESSION:  PT End of Session - 11/24/23 1533     Visit Number 5    Date for Recertification  02/13/24    Authorization Type [employer]    PT Start Time 1530    PT Stop Time 1615    PT Time Calculation (min) 45 min    Activity Tolerance Patient tolerated treatment well;Patient limited by pain    Behavior During Therapy [facility] for tasks assessed/performed          Past Medical History:  Diagnosis Date   Anxiety    Arthritis    Back pain of lumbar region with sciatica    Cancer (HCC)    breast   Depression    Family history of anesthesia complication    grandfather died under anesthesia 70's- had heart issues   H/O carpal tunnel repair 2014   Headache(784.0)    Hyperlipidemia    Hypertension    Hypothyroidism    Medial meniscus tear    PONV (postoperative nausea and vomiting)    Pre-diabetes    Sleep apnea    cpap since 10   Past Surgical History:  Procedure Laterality Date   BILATERAL TOTAL MASTECTOMY WITH AXILLARY LYMPH NODE DISSECTION     BREAST RECONSTRUCTION Left 07/17/2023   for encapsulation,  done at [facility]   CERVICAL DISC ARTHROPLASTY N/A 03/19/2013   Procedure: CERVICAL FIVE TO SIX, CERVICAL SIX TO SEVEN CERVICAL ANTERIOR DISC ARTHROPLASTY;  Surgeon: Victory Gens, MD;  Location: [facility];  Service: Neurosurgery;  Laterality: N/A;  C56 C67 artificial disc replacement   CESAREAN SECTION     COLONOSCOPY     DILATATION & CURETTAGE/HYSTEROSCOPY WITH TRUECLEAR N/A 11/25/2013   Procedure: DILATATION & CURETTAGE/HYSTEROSCOPY WITH TRUCLEAR;  Surgeon: Charlie CHRISTELLA Croak, MD;  Location: [facility];  Service: Gynecology;  Laterality: N/A;   ECTOPIC PREGNANCY SURGERY     GALLBLADDER SURGERY     MOUTH SURGERY     child- dog bite   SPINAL FUSION     2023   STERIOD INJECTION Left 09/09/2022   Procedure: LEFT KNEE  STEROID INJECTION;  Surgeon: Melodi Lerner, MD;  Location: [facility];  Service: Orthopedics;  Laterality: Left;  left knee injection 0928   TONSILLECTOMY     TOTAL KNEE ARTHROPLASTY Right 09/09/2022   Procedure: TOTAL KNEE ARTHROPLASTY;  Surgeon: Melodi Lerner, MD;  Location: [facility];  Service: Orthopedics;  Laterality: Right;   TOTAL KNEE ARTHROPLASTY Left 11/10/2023   Procedure: ARTHROPLASTY, KNEE, TOTAL;  Surgeon: Melodi Lerner, MD;  Location: [facility];  Service: Orthopedics;  Laterality: Left;   TUBAL LIGATION     Patient Active Problem List   Diagnosis Date Noted   Primary osteoarthritis of left knee 11/10/2023   OA (osteoarthritis) of knee 09/09/2022   Exertional dyspnea 04/17/2022   Elevated coronary artery calcium  score 04/17/2022   Spondylolisthesis at L4-L5 level 12/27/2021   Ductal carcinoma in situ (DCIS) of left breast 01/19/2018   Generalized anxiety disorder 11/07/2017   Depression 11/07/2017   Insomnia 11/07/2017   Cervical spondylosis 03/19/2013    PCP: Onita, MD  REFERRING PROVIDER: Bethanie, PA  REFERRING DIAG: s/p left TKA  THERAPY DIAG:  Acute pain of left knee  Stiffness of left knee, not elsewhere classified  Localized edema  Difficulty in walking, not elsewhere classified  Rationale for Evaluation and Treatment: Rehabilitation  ONSET  DATE: 11/10/23  SUBJECTIVE:   SUBJECTIVE STATEMENT: I have had to be up and doing a lot of walking, daughter in the hospital, did not do exercises  Patient underwent a left TKA on 11/10/23, she reports that she is doing okay, reports that she had noodle leg, reports trying to do the exercises daily  PERTINENT HISTORY: 63 yo female s/p L TKA on 11/10/23. PMH: R TKA, back pain, L3-4, 4-5 fusion in 2023, HTN, DM, C-spine arthroplasty  PAIN:  Are you having pain? Yes: NPRS scale: 9/10 Pain location: left knee Pain description: ache, sharpe , tender Aggravating factors: moving, walking , bending, I can't straighten  the leg pain up to 10/10 Relieving factors: pain meds, ice, rest, elevation at best pain a 7/10  PRECAUTIONS: Other: back surgery in the past  RED FLAGS: None   WEIGHT BEARING RESTRICTIONS: No  FALLS:  Has patient fallen in last 6 months? No  LIVING ENVIRONMENT: Lives with: lives with their family Lives in: House/apartment Stairs: Yes: Internal: 20 steps; can reach both Has following equipment at home: Single point cane, Walker - 2 wheeled, shower chair, and Grab bars  OCCUPATION: volunteers  PLOF: Independent and no device was doing yoga  PATIENT GOALS: walk without device, have no pain, normal ROM, go up and down stairs step over step  NEXT MD VISIT: November 3  OBJECTIVE:  Note: Objective measures were completed at Evaluation unless otherwise noted.  DIAGNOSTIC FINDINGS: none  COGNITION: Overall cognitive status: Within functional limits for tasks assessed     SENSATION: WFL  EDEMA:  Circumferential: right mid patella 44cm, left 49 cm  MUSCLE LENGTH: Not tested  POSTURE: rounded shoulders, forward head, and knee guarded   PALPATION: Very sore and tender, some bruising in the thigh, very red in the medial knee with heat  LOWER EXTREMITY ROM:  Active ROM LEFT AROM 11/13/23 eval Left PROM 11/13/23 eval AROM LEFT 11/24/23  Hip flexion     Hip extension     Hip abduction     Hip adduction     Hip internal rotation     Hip external rotation     Knee flexion 80 85 95  Knee extension 25 20 20   Ankle dorsiflexion     Ankle plantarflexion     Ankle inversion     Ankle eversion      (Blank rows = not tested)  LOWER EXTREMITY MMT:  MMT Right eval Left eval  Hip flexion    Hip extension    Hip abduction    Hip adduction    Hip internal rotation    Hip external rotation    Knee flexion    Knee extension    Ankle dorsiflexion    Ankle plantarflexion    Ankle inversion    Ankle eversion     (Blank rows = not tested)  FUNCTIONAL TESTS:  5  times sit to stand: 35 seconds Timed up and go (TUG): 56 seconds with FWW  GAIT: Distance walked: 60 feet Assistive device utilized: Walker - 2 wheeled Level of assistance: CGA Comments: step to pattern, shortened stance phase on the left  TREATMENT DATE:  11/24/23 Bike partial revs x 6 minutes 20# leg curls 4 step over steps 3# LAQ Gait outside with SPC down curb and back with CGA LEg press 20# just working on flexion Passive stretch Scar mobilizations ROM measured as noted above Feet on ball K2C, rotation, bridge, isometric abs Vaso medium pressure in elevation 34 degrees  11/20/23 6 toe clears Step lunge strech 20# HS curls, then left only 10# Nustep level 5 x 5 minutes Bike partial revs Gait with cues to bend knee and having her exaggerate the bend at toe off Feet on ball K2C, bridge SAQ Passive left knee stretch Vaso medium pressure 34 degrees  11/19/23 Nustep level 3 x 6 minutes Stairs step over step 4and 6 going up step over step Second step lunge stretch Bike partial revs x 4 minutes 20# HS curls Gait with SPC, cues verbal CGA Passive stretch left knee, gentle STM to the left thigh, scar mobs Vaso left knee 34 degrees   11/17/23 Nustep level 5 x 5 minutes STM to the quad and scar very gentle Gentle scar and patellar mobs 6 toe clears 6 lunge stretch 20# HS curls 2.5# LAQ Gait with walker, verbal and tactile cues to bend knee at toe off Some walking with HHA and walking with SPC again cues needed Feet on ball K2C, bridge Vaso medium pressure 37 degrees in elevation  11/13/23 Evaluation SAQ Vaso Medium pressure 37 degrees left knee     PATIENT EDUCATION:  Education details: reviewed RICE, gait, HEP Person educated: Patient Education method: Explanation, Demonstration, Tactile cues, Verbal cues, and  Handouts Education comprehension: verbalized understanding, returned demonstration, verbal cues required, and tactile cues required  HOME EXERCISE PROGRAM: QS, chair scoots for flexion, ankle pumps, SLR, LAQ  ASSESSMENT:  CLINICAL IMPRESSION: Patient doing very well, pain is a 6/10 at most times, she is walking most distances now with a SPC, still a little stiff requiring cues to bend the knee at toe off.  Her AROM was 20-95 degrees flexion a good improvement in the past week, she has been very active due to daughter being in the hospital so a little more swollen today.  Patient is a 63 y.o. female who was seen today for physical therapy evaluation and treatment for s/p left TKA on 11/10/23.  She is in a lot pain, has significant tenderness, a lot of redness and warmth in the medial knee.  Reports that she cannot raise the foot up without help but with encouragement she could, was able to do a SAQ.  TUG time was 56 seconds.   Has a lot of edema.   OBJECTIVE IMPAIRMENTS: Abnormal gait, cardiopulmonary status limiting activity, decreased activity tolerance, decreased balance, decreased coordination, decreased endurance, decreased mobility, difficulty walking, decreased ROM, decreased strength, increased edema, increased muscle spasms, impaired flexibility, and pain.    REHAB POTENTIAL: Good  CLINICAL DECISION MAKING: Evolving/moderate complexity  EVALUATION COMPLEXITY: Moderate   GOALS: Goals reviewed with patient? Yes  SHORT TERM GOALS: Target date: 12/06/23 Independent with initial HEP Baseline: Goal status: met 11/19/23  LONG TERM GOALS: Target date: 04/12/24  Independent with advanced HEP Baseline:  Goal status: INITIAL  2.  Walk with LRAD all distances with minimal deviations Baseline:  Goal status:progressing 11/24/23  3.  Increase AROM of the left knee to 0-120 degrees flexion Baseline:  Goal status: INITIAL  4.  Decrease pain 50% Baseline:  Goal status:  INITIAL  5.  Decrease TUG time to 18 seconds Baseline:  Goal status: INITIAL  6.  Go up and down stairs step over step Baseline:  Goal status: INITIAL   PLAN:  PT FREQUENCY: 3x/week to start and then will decrease as she improves  PT DURATION: 12 weeks  PLANNED INTERVENTIONS: 97164- PT Re-evaluation, 97110-Therapeutic exercises, 97530- Therapeutic activity, 97112- Neuromuscular re-education, 97535- Self Care, [id number]- Manual therapy, [phone]- Gait training, [phone]- Electrical stimulation (unattended), 97016- Vasopneumatic device, Patient/Family education, Balance training, Stair training, Joint mobilization, and Cryotherapy  PLAN FOR NEXT SESSION: work on motion, mm activation and walking   LIBERTY MEDIA, PT 11/24/2023, 3:34 PM

## 2023-11-26 ENCOUNTER — Ambulatory Visit: Admitting: Physical Therapy

## 2023-11-26 ENCOUNTER — Encounter: Payer: Self-pay | Admitting: Physical Therapy

## 2023-11-26 DIAGNOSIS — G8929 Other chronic pain: Secondary | ICD-10-CM

## 2023-11-26 DIAGNOSIS — R262 Difficulty in walking, not elsewhere classified: Secondary | ICD-10-CM

## 2023-11-26 DIAGNOSIS — R600 Localized edema: Secondary | ICD-10-CM

## 2023-11-26 DIAGNOSIS — M25562 Pain in left knee: Secondary | ICD-10-CM

## 2023-11-26 DIAGNOSIS — M25662 Stiffness of left knee, not elsewhere classified: Secondary | ICD-10-CM

## 2023-11-26 NOTE — Therapy (Signed)
 OUTPATIENT PHYSICAL THERAPY LOWER EXTREMITY TREATMENT   Patient Name: [name] MRN: [id number] DOB:[birth year], 63 y.o., female Today's Date: 11/26/2023  END OF SESSION:  PT End of Session - 11/26/23 1527     Visit Number 6    Date for Recertification  02/13/24    Authorization Type [employer]    PT Start Time 1526    PT Stop Time 1625    PT Time Calculation (min) 59 min    Activity Tolerance Patient tolerated treatment well;Patient limited by pain    Behavior During Therapy WFL for tasks assessed/performed          Past Medical History:  Diagnosis Date   Anxiety    Arthritis    Back pain of lumbar region with sciatica    Cancer (HCC)    breast   Depression    Family history of anesthesia complication    grandfather died under anesthesia 70's- had heart issues   H/O carpal tunnel repair 2014   Headache(784.0)    Hyperlipidemia    Hypertension    Hypothyroidism    Medial meniscus tear    PONV (postoperative nausea and vomiting)    Pre-diabetes    Sleep apnea    cpap since 10   Past Surgical History:  Procedure Laterality Date   BILATERAL TOTAL MASTECTOMY WITH AXILLARY LYMPH NODE DISSECTION     BREAST RECONSTRUCTION Left 07/17/2023   for encapsulation,  done at [facility]   CERVICAL DISC ARTHROPLASTY N/A 03/19/2013   Procedure: CERVICAL FIVE TO SIX, CERVICAL SIX TO SEVEN CERVICAL ANTERIOR DISC ARTHROPLASTY;  Surgeon: Victory Gens, MD;  Location: [facility];  Service: Neurosurgery;  Laterality: N/A;  C56 C67 artificial disc replacement   CESAREAN SECTION     COLONOSCOPY     DILATATION & CURETTAGE/HYSTEROSCOPY WITH TRUECLEAR N/A 11/25/2013   Procedure: DILATATION & CURETTAGE/HYSTEROSCOPY WITH TRUCLEAR;  Surgeon: Charlie CHRISTELLA Croak, MD;  Location: [facility];  Service: Gynecology;  Laterality: N/A;   ECTOPIC PREGNANCY SURGERY     GALLBLADDER SURGERY     MOUTH SURGERY     child- dog bite   SPINAL FUSION     2023   STERIOD INJECTION Left 09/09/2022   Procedure: LEFT KNEE  STEROID INJECTION;  Surgeon: Melodi Lerner, MD;  Location: [facility];  Service: Orthopedics;  Laterality: Left;  left knee injection 0928   TONSILLECTOMY     TOTAL KNEE ARTHROPLASTY Right 09/09/2022   Procedure: TOTAL KNEE ARTHROPLASTY;  Surgeon: Melodi Lerner, MD;  Location: [facility];  Service: Orthopedics;  Laterality: Right;   TOTAL KNEE ARTHROPLASTY Left 11/10/2023   Procedure: ARTHROPLASTY, KNEE, TOTAL;  Surgeon: Melodi Lerner, MD;  Location: [facility];  Service: Orthopedics;  Laterality: Left;   TUBAL LIGATION     Patient Active Problem List   Diagnosis Date Noted   Primary osteoarthritis of left knee 11/10/2023   OA (osteoarthritis) of knee 09/09/2022   Exertional dyspnea 04/17/2022   Elevated coronary artery calcium  score 04/17/2022   Spondylolisthesis at L4-L5 level 12/27/2021   Ductal carcinoma in situ (DCIS) of left breast 01/19/2018   Generalized anxiety disorder 11/07/2017   Depression 11/07/2017   Insomnia 11/07/2017   Cervical spondylosis 03/19/2013    PCP: Onita, MD  REFERRING PROVIDER: Bethanie, PA  REFERRING DIAG: s/p left TKA  THERAPY DIAG:  Acute pain of left knee  Stiffness of left knee, not elsewhere classified  Localized edema  Difficulty in walking, not elsewhere classified  Chronic bilateral back pain, unspecified back location  Rationale for Evaluation and Treatment: Rehabilitation  ONSET DATE: 11/10/23  SUBJECTIVE:   SUBJECTIVE STATEMENT: I saw the MD yesterday, very pleased.  Patient underwent a left TKA on 11/10/23, she reports that she is doing okay, reports that she had noodle leg, reports trying to do the exercises daily  PERTINENT HISTORY: 63 yo female s/p L TKA on 11/10/23. PMH: R TKA, back pain, L3-4, 4-5 fusion in 2023, HTN, DM, C-spine arthroplasty  PAIN:  Are you having pain? Yes: NPRS scale: 9/10 Pain location: left knee Pain description: ache, sharpe , tender Aggravating factors: moving, walking , bending, I can't straighten  the leg pain up to 10/10 Relieving factors: pain meds, ice, rest, elevation at best pain a 7/10  PRECAUTIONS: Other: back surgery in the past  RED FLAGS: None   WEIGHT BEARING RESTRICTIONS: No  FALLS:  Has patient fallen in last 6 months? No  LIVING ENVIRONMENT: Lives with: lives with their family Lives in: House/apartment Stairs: Yes: Internal: 20 steps; can reach both Has following equipment at home: Single point cane, Walker - 2 wheeled, shower chair, and Grab bars  OCCUPATION: volunteers  PLOF: Independent and no device was doing yoga  PATIENT GOALS: walk without device, have no pain, normal ROM, go up and down stairs step over step  NEXT MD VISIT: November 3  OBJECTIVE:  Note: Objective measures were completed at Evaluation unless otherwise noted.  DIAGNOSTIC FINDINGS: none  COGNITION: Overall cognitive status: Within functional limits for tasks assessed     SENSATION: WFL  EDEMA:  Circumferential: right mid patella 44cm, left 49 cm  MUSCLE LENGTH: Not tested  POSTURE: rounded shoulders, forward head, and knee guarded   PALPATION: Very sore and tender, some bruising in the thigh, very red in the medial knee with heat  LOWER EXTREMITY ROM:  Active ROM LEFT AROM 11/13/23 eval Left PROM 11/13/23 eval AROM LEFT 11/24/23  Hip flexion     Hip extension     Hip abduction     Hip adduction     Hip internal rotation     Hip external rotation     Knee flexion 80 85 95  Knee extension 25 20 20   Ankle dorsiflexion     Ankle plantarflexion     Ankle inversion     Ankle eversion      (Blank rows = not tested)  LOWER EXTREMITY MMT:  MMT Right eval Left eval  Hip flexion    Hip extension    Hip abduction    Hip adduction    Hip internal rotation    Hip external rotation    Knee flexion    Knee extension    Ankle dorsiflexion    Ankle plantarflexion    Ankle inversion    Ankle eversion     (Blank rows = not tested)  FUNCTIONAL TESTS:  5  times sit to stand: 35 seconds Timed up and go (TUG): 56 seconds with FWW  GAIT: Distance walked: 60 feet Assistive device utilized: Walker - 2 wheeled Level of assistance: CGA Comments: step to pattern, shortened stance phase on the left  TREATMENT DATE:  11/26/23 Nustep level 5 x 5 minutes Bike partial revs to full revs 5 minutes Leg curls 20# 20# leg press and then no weight working on flexion, then left only no weight small ROM Gait outside with SPC and CGA curbs 4 step up and down step over step Passive stretch flexion and extension 2# LAQ 2x10 some cues for TKE Feet on ball K2C, bridge Vaso 34 degrees  11/24/23 Bike partial revs x 6 minutes 20# leg curls 4 step over steps 3# LAQ Gait outside with SPC down curb and back with CGA LEg press 20# just working on flexion Passive stretch Scar mobilizations ROM measured as noted above Feet on ball K2C, rotation, bridge, isometric abs Vaso medium pressure in elevation 34 degrees  11/20/23 6 toe clears Step lunge strech 20# HS curls, then left only 10# Nustep level 5 x 5 minutes Bike partial revs Gait with cues to bend knee and having her exaggerate the bend at toe off Feet on ball K2C, bridge SAQ Passive left knee stretch Vaso medium pressure 34 degrees  11/19/23 Nustep level 3 x 6 minutes Stairs step over step 4and 6 going up step over step Second step lunge stretch Bike partial revs x 4 minutes 20# HS curls Gait with SPC, cues verbal CGA Passive stretch left knee, gentle STM to the left thigh, scar mobs Vaso left knee 34 degrees   11/17/23 Nustep level 5 x 5 minutes STM to the quad and scar very gentle Gentle scar and patellar mobs 6 toe clears 6 lunge stretch 20# HS curls 2.5# LAQ Gait with walker, verbal and tactile cues to bend knee at toe off Some walking with HHA  and walking with SPC again cues needed Feet on ball K2C, bridge Vaso medium pressure 37 degrees in elevation  11/13/23 Evaluation SAQ Vaso Medium pressure 37 degrees left knee     PATIENT EDUCATION:  Education details: reviewed RICE, gait, HEP Person educated: Patient Education method: Explanation, Demonstration, Tactile cues, Verbal cues, and Handouts Education comprehension: verbalized understanding, returned demonstration, verbal cues required, and tactile cues required  HOME EXERCISE PROGRAM: QS, chair scoots for flexion, ankle pumps, SLR, LAQ  ASSESSMENT:  CLINICAL IMPRESSION: Patient doing very well,she saw the MD and he was pleased, we continue to work on pushing ROM, strength and function, needs cues to get TKE, needs cues to bend knee and toe off,  Patient is a 63 y.o. female who was seen today for physical therapy evaluation and treatment for s/p left TKA on 11/10/23.  She is in a lot pain, has significant tenderness, a lot of redness and warmth in the medial knee.  Reports that she cannot raise the foot up without help but with encouragement she could, was able to do a SAQ.  TUG time was 56 seconds.   Has a lot of edema.   OBJECTIVE IMPAIRMENTS: Abnormal gait, cardiopulmonary status limiting activity, decreased activity tolerance, decreased balance, decreased coordination, decreased endurance, decreased mobility, difficulty walking, decreased ROM, decreased strength, increased edema, increased muscle spasms, impaired flexibility, and pain.    REHAB POTENTIAL: Good  CLINICAL DECISION MAKING: Evolving/moderate complexity  EVALUATION COMPLEXITY: Moderate   GOALS: Goals reviewed with patient? Yes  SHORT TERM GOALS: Target date: 12/06/23 Independent with initial HEP Baseline: Goal status: met 11/19/23  LONG TERM GOALS: Target date: 04/12/24  Independent with advanced HEP Baseline:  Goal status: INITIAL  2.  Walk with LRAD all distances with minimal  deviations Baseline:  Goal status:progressing 11/24/23  3.  Increase AROM of the left knee to 0-120 degrees flexion Baseline:  Goal status: INITIAL  4.  Decrease pain 50% Baseline:  Goal status: INITIAL  5.  Decrease TUG time to 18 seconds Baseline:  Goal status: INITIAL  6.  Go up and down stairs step over step Baseline:  Goal status: INITIAL   PLAN:  PT FREQUENCY: 3x/week to start and then will decrease as she improves  PT DURATION: 12 weeks  PLANNED INTERVENTIONS: 97164- PT Re-evaluation, 97110-Therapeutic exercises, 97530- Therapeutic activity, 97112- Neuromuscular re-education, 97535- Self Care, [id number]- Manual therapy, [phone]- Gait training, [phone]- Electrical stimulation (unattended), 97016- Vasopneumatic device, Patient/Family education, Balance training, Stair training, Joint mobilization, and Cryotherapy  PLAN FOR NEXT SESSION: work on motion, mm activation and walking   LIBERTY MEDIA, PT 11/26/2023, 3:28 PM

## 2023-11-27 ENCOUNTER — Ambulatory Visit: Admitting: Physical Therapy

## 2023-11-27 ENCOUNTER — Encounter: Payer: Self-pay | Admitting: Physical Therapy

## 2023-11-27 DIAGNOSIS — M25662 Stiffness of left knee, not elsewhere classified: Secondary | ICD-10-CM

## 2023-11-27 DIAGNOSIS — R262 Difficulty in walking, not elsewhere classified: Secondary | ICD-10-CM

## 2023-11-27 DIAGNOSIS — R600 Localized edema: Secondary | ICD-10-CM

## 2023-11-27 DIAGNOSIS — M25562 Pain in left knee: Secondary | ICD-10-CM

## 2023-11-27 NOTE — Therapy (Signed)
 OUTPATIENT PHYSICAL THERAPY LOWER EXTREMITY TREATMENT   Patient Name: [name] MRN: [id number] DOB:1960/09/01, 63 y.o., female Today's Date: 11/27/2023  END OF SESSION:  PT End of Session - 11/27/23 1533     Visit Number 7    Date for Recertification  02/13/24    Authorization Type [employer]    PT Start Time 1528    PT Stop Time 1628    PT Time Calculation (min) 60 min    Activity Tolerance Patient tolerated treatment well;Patient limited by pain    Behavior During Therapy [facility] for tasks assessed/performed          Past Medical History:  Diagnosis Date   Anxiety    Arthritis    Back pain of lumbar region with sciatica    Cancer (HCC)    breast   Depression    Family history of anesthesia complication    grandfather died under anesthesia 70's- had heart issues   H/O carpal tunnel repair 2014   Headache(784.0)    Hyperlipidemia    Hypertension    Hypothyroidism    Medial meniscus tear    PONV (postoperative nausea and vomiting)    Pre-diabetes    Sleep apnea    cpap since 10   Past Surgical History:  Procedure Laterality Date   BILATERAL TOTAL MASTECTOMY WITH AXILLARY LYMPH NODE DISSECTION     BREAST RECONSTRUCTION Left 07/17/2023   for encapsulation,  done at [facility]   CERVICAL DISC ARTHROPLASTY N/A 03/19/2013   Procedure: CERVICAL FIVE TO SIX, CERVICAL SIX TO SEVEN CERVICAL ANTERIOR DISC ARTHROPLASTY;  Surgeon: Victory Gens, MD;  Location: [facility];  Service: Neurosurgery;  Laterality: N/A;  C56 C67 artificial disc replacement   CESAREAN SECTION     COLONOSCOPY     DILATATION & CURETTAGE/HYSTEROSCOPY WITH TRUECLEAR N/A 11/25/2013   Procedure: DILATATION & CURETTAGE/HYSTEROSCOPY WITH TRUCLEAR;  Surgeon: Charlie CHRISTELLA Croak, MD;  Location: [facility];  Service: Gynecology;  Laterality: N/A;   ECTOPIC PREGNANCY SURGERY     GALLBLADDER SURGERY     MOUTH SURGERY     child- dog bite   SPINAL FUSION     2023   STERIOD INJECTION Left 09/09/2022   Procedure: LEFT KNEE  STEROID INJECTION;  Surgeon: Melodi Lerner, MD;  Location: [facility];  Service: Orthopedics;  Laterality: Left;  left knee injection 0928   TONSILLECTOMY     TOTAL KNEE ARTHROPLASTY Right 09/09/2022   Procedure: TOTAL KNEE ARTHROPLASTY;  Surgeon: Melodi Lerner, MD;  Location: [facility];  Service: Orthopedics;  Laterality: Right;   TOTAL KNEE ARTHROPLASTY Left 11/10/2023   Procedure: ARTHROPLASTY, KNEE, TOTAL;  Surgeon: Melodi Lerner, MD;  Location: [facility];  Service: Orthopedics;  Laterality: Left;   TUBAL LIGATION     Patient Active Problem List   Diagnosis Date Noted   Primary osteoarthritis of left knee 11/10/2023   OA (osteoarthritis) of knee 09/09/2022   Exertional dyspnea 04/17/2022   Elevated coronary artery calcium  score 04/17/2022   Spondylolisthesis at L4-L5 level 12/27/2021   Ductal carcinoma in situ (DCIS) of left breast 01/19/2018   Generalized anxiety disorder 11/07/2017   Depression 11/07/2017   Insomnia 11/07/2017   Cervical spondylosis 03/19/2013    PCP: Onita, MD  REFERRING PROVIDER: Bethanie, PA  REFERRING DIAG: s/p left TKA  THERAPY DIAG:  Acute pain of left knee  Stiffness of left knee, not elsewhere classified  Localized edema  Difficulty in walking, not elsewhere classified  Rationale for Evaluation and Treatment: Rehabilitation  ONSET  DATE: 11/10/23  SUBJECTIVE:   SUBJECTIVE STATEMENT: Stiff today  Patient underwent a left TKA on 11/10/23, she reports that she is doing okay, reports that she had noodle leg, reports trying to do the exercises daily  PERTINENT HISTORY: 63 yo female s/p L TKA on 11/10/23. PMH: R TKA, back pain, L3-4, 4-5 fusion in 2023, HTN, DM, C-spine arthroplasty  PAIN:  Are you having pain? Yes: NPRS scale: 9/10 Pain location: left knee Pain description: ache, sharpe , tender Aggravating factors: moving, walking , bending, I can't straighten the leg pain up to 10/10 Relieving factors: pain meds, ice, rest, elevation at best  pain a 7/10  PRECAUTIONS: Other: back surgery in the past  RED FLAGS: None   WEIGHT BEARING RESTRICTIONS: No  FALLS:  Has patient fallen in last 6 months? No  LIVING ENVIRONMENT: Lives with: lives with their family Lives in: House/apartment Stairs: Yes: Internal: 20 steps; can reach both Has following equipment at home: Single point cane, Walker - 2 wheeled, shower chair, and Grab bars  OCCUPATION: volunteers  PLOF: Independent and no device was doing yoga  PATIENT GOALS: walk without device, have no pain, normal ROM, go up and down stairs step over step  NEXT MD VISIT: November 3  OBJECTIVE:  Note: Objective measures were completed at Evaluation unless otherwise noted.  DIAGNOSTIC FINDINGS: none  COGNITION: Overall cognitive status: Within functional limits for tasks assessed     SENSATION: WFL  EDEMA:  Circumferential: right mid patella 44cm, left 49 cm  MUSCLE LENGTH: Not tested  POSTURE: rounded shoulders, forward head, and knee guarded   PALPATION: Very sore and tender, some bruising in the thigh, very red in the medial knee with heat  LOWER EXTREMITY ROM:  Active ROM LEFT AROM 11/13/23 eval Left PROM 11/13/23 eval AROM LEFT 11/24/23  Hip flexion     Hip extension     Hip abduction     Hip adduction     Hip internal rotation     Hip external rotation     Knee flexion 80 85 95  Knee extension 25 20 20   Ankle dorsiflexion     Ankle plantarflexion     Ankle inversion     Ankle eversion      (Blank rows = not tested)  LOWER EXTREMITY MMT:  MMT Right eval Left eval  Hip flexion    Hip extension    Hip abduction    Hip adduction    Hip internal rotation    Hip external rotation    Knee flexion    Knee extension    Ankle dorsiflexion    Ankle plantarflexion    Ankle inversion    Ankle eversion     (Blank rows = not tested)  FUNCTIONAL TESTS:  5 times sit to stand: 35 seconds Timed up and go (TUG): 56 seconds with  FWW  GAIT: Distance walked: 60 feet Assistive device utilized: Walker - 2 wheeled Level of assistance: CGA Comments: step to pattern, shortened stance phase on the left  TREATMENT DATE:  11/27/23 Bike full revs 6 minutes Nustep level 5 x 5 minutes 4 stairs up and down step over step 20# leg curls 20# Leg press  then no weight for ROM 2.5# LAQ 12 toe clears and then lunge stretch Passive stretch flexion and extension STM to the scar Vaso medium pressure 34 degrees  11/26/23 Nustep level 5 x 5 minutes Bike partial revs to full revs 5 minutes Leg curls 20# 20# leg press and then no weight working on flexion, then left only no weight small ROM Gait outside with SPC and CGA curbs 4 step up and down step over step Passive stretch flexion and extension 2# LAQ 2x10 some cues for TKE Feet on ball K2C, bridge Vaso 34 degrees  11/24/23 Bike partial revs x 6 minutes 20# leg curls 4 step over steps 3# LAQ Gait outside with SPC down curb and back with CGA LEg press 20# just working on flexion Passive stretch Scar mobilizations ROM measured as noted above Feet on ball K2C, rotation, bridge, isometric abs Vaso medium pressure in elevation 34 degrees  11/20/23 6 toe clears Step lunge strech 20# HS curls, then left only 10# Nustep level 5 x 5 minutes Bike partial revs Gait with cues to bend knee and having her exaggerate the bend at toe off Feet on ball K2C, bridge SAQ Passive left knee stretch Vaso medium pressure 34 degrees  11/19/23 Nustep level 3 x 6 minutes Stairs step over step 4and 6 going up step over step Second step lunge stretch Bike partial revs x 4 minutes 20# HS curls Gait with SPC, cues verbal CGA Passive stretch left knee, gentle STM to the left thigh, scar mobs Vaso left knee 34 degrees   11/17/23 Nustep level 5 x 5  minutes STM to the quad and scar very gentle Gentle scar and patellar mobs 6 toe clears 6 lunge stretch 20# HS curls 2.5# LAQ Gait with walker, verbal and tactile cues to bend knee at toe off Some walking with HHA and walking with SPC again cues needed Feet on ball K2C, bridge Vaso medium pressure 37 degrees in elevation  11/13/23 Evaluation SAQ Vaso Medium pressure 37 degrees left knee     PATIENT EDUCATION:  Education details: reviewed RICE, gait, HEP Person educated: Patient Education method: Explanation, Demonstration, Tactile cues, Verbal cues, and Handouts Education comprehension: verbalized understanding, returned demonstration, verbal cues required, and tactile cues required  HOME EXERCISE PROGRAM: QS, chair scoots for flexion, ankle pumps, SLR, LAQ  ASSESSMENT:  CLINICAL IMPRESSION: Patient doing well feels stiff.  She is walking all with SPC only.  Can go up stairs 6 step over step.  Down is very tough, she can do 4 step over step.  Gait is still stiff at times  Patient is a 63 y.o. female who was seen today for physical therapy evaluation and treatment for s/p left TKA on 11/10/23.  She is in a lot pain, has significant tenderness, a lot of redness and warmth in the medial knee.  Reports that she cannot raise the foot up without help but with encouragement she could, was able to do a SAQ.  TUG time was 56 seconds.   Has a lot of edema.   OBJECTIVE IMPAIRMENTS: Abnormal gait, cardiopulmonary status limiting activity, decreased activity tolerance, decreased balance, decreased coordination, decreased endurance, decreased mobility, difficulty walking, decreased ROM, decreased strength, increased edema, increased muscle spasms, impaired flexibility, and pain.    REHAB POTENTIAL: Good  CLINICAL DECISION MAKING: Evolving/moderate  complexity  EVALUATION COMPLEXITY: Moderate   GOALS: Goals reviewed with patient? Yes  SHORT TERM GOALS: Target date:  12/06/23 Independent with initial HEP Baseline: Goal status: met 11/19/23  LONG TERM GOALS: Target date: 04/12/24  Independent with advanced HEP Baseline:  Goal status: INITIAL  2.  Walk with LRAD all distances with minimal deviations Baseline:  Goal status:progressing 11/24/23  3.  Increase AROM of the left knee to 0-120 degrees flexion Baseline:  Goal status: progressing 11/27/23  4.  Decrease pain 50% Baseline:  Goal status: INITIAL  5.  Decrease TUG time to 18 seconds Baseline:  Goal status: INITIAL  6.  Go up and down stairs step over step Baseline:  Goal status: INITIAL   PLAN:  PT FREQUENCY: 3x/week to start and then will decrease as she improves  PT DURATION: 12 weeks  PLANNED INTERVENTIONS: 97164- PT Re-evaluation, 97110-Therapeutic exercises, 97530- Therapeutic activity, 97112- Neuromuscular re-education, 97535- Self Care, [id number]- Manual therapy, [phone]- Gait training, [phone]- Electrical stimulation (unattended), 97016- Vasopneumatic device, Patient/Family education, Balance training, Stair training, Joint mobilization, and Cryotherapy  PLAN FOR NEXT SESSION: work on motion, mm activation and walking   LIBERTY MEDIA, PT 11/27/2023, 3:34 PM

## 2023-12-01 ENCOUNTER — Encounter: Payer: Self-pay | Admitting: Physical Therapy

## 2023-12-01 ENCOUNTER — Ambulatory Visit: Admitting: Physical Therapy

## 2023-12-01 DIAGNOSIS — M25662 Stiffness of left knee, not elsewhere classified: Secondary | ICD-10-CM

## 2023-12-01 DIAGNOSIS — M25562 Pain in left knee: Secondary | ICD-10-CM | POA: Diagnosis not present

## 2023-12-01 DIAGNOSIS — R262 Difficulty in walking, not elsewhere classified: Secondary | ICD-10-CM

## 2023-12-01 DIAGNOSIS — R600 Localized edema: Secondary | ICD-10-CM

## 2023-12-01 DIAGNOSIS — G8929 Other chronic pain: Secondary | ICD-10-CM

## 2023-12-01 NOTE — Therapy (Signed)
 OUTPATIENT PHYSICAL THERAPY LOWER EXTREMITY TREATMENT   Patient Name: [name] MRN: [id number] DOB:1960-05-19, 63 y.o., female Today's Date: 12/01/2023  END OF SESSION:  PT End of Session - 12/01/23 1530     Visit Number 8    Date for Recertification  02/13/24    Authorization Type U[employer]    PT Start Time 1525    PT Stop Time 1620    PT Time Calculation (min) 55 min    Activity Tolerance Patient tolerated treatment well;Patient limited by pain    Behavior During Therapy [facility] for tasks assessed/performed          Past Medical History:  Diagnosis Date   Anxiety    Arthritis    Back pain of lumbar region with sciatica    Cancer (HCC)    breast   Depression    Family history of anesthesia complication    grandfather died under anesthesia 70's- had heart issues   H/O carpal tunnel repair 2014   Headache(784.0)    Hyperlipidemia    Hypertension    Hypothyroidism    Medial meniscus tear    PONV (postoperative nausea and vomiting)    Pre-diabetes    Sleep apnea    cpap since 10   Past Surgical History:  Procedure Laterality Date   BILATERAL TOTAL MASTECTOMY WITH AXILLARY LYMPH NODE DISSECTION     BREAST RECONSTRUCTION Left 07/17/2023   for encapsulation,  done at [facility]   CERVICAL DISC ARTHROPLASTY N/A 03/19/2013   Procedure: CERVICAL FIVE TO SIX, CERVICAL SIX TO SEVEN CERVICAL ANTERIOR DISC ARTHROPLASTY;  Surgeon: Victory Gens, MD;  Location: [facility];  Service: Neurosurgery;  Laterality: N/A;  C56 C67 artificial disc replacement   CESAREAN SECTION     COLONOSCOPY     DILATATION & CURETTAGE/HYSTEROSCOPY WITH TRUECLEAR N/A 11/25/2013   Procedure: DILATATION & CURETTAGE/HYSTEROSCOPY WITH TRUCLEAR;  Surgeon: Charlie CHRISTELLA Croak, MD;  Location: [facility];  Service: Gynecology;  Laterality: N/A;   ECTOPIC PREGNANCY SURGERY     GALLBLADDER SURGERY     MOUTH SURGERY     child- dog bite   SPINAL FUSION     2023   STERIOD INJECTION Left 09/09/2022   Procedure: LEFT KNEE  STEROID INJECTION;  Surgeon: Melodi Lerner, MD;  Location: [facility];  Service: Orthopedics;  Laterality: Left;  left knee injection 0928   TONSILLECTOMY     TOTAL KNEE ARTHROPLASTY Right 09/09/2022   Procedure: TOTAL KNEE ARTHROPLASTY;  Surgeon: Melodi Lerner, MD;  Location: [facility];  Service: Orthopedics;  Laterality: Right;   TOTAL KNEE ARTHROPLASTY Left 11/10/2023   Procedure: ARTHROPLASTY, KNEE, TOTAL;  Surgeon: Melodi Lerner, MD;  Location: [facility];  Service: Orthopedics;  Laterality: Left;   TUBAL LIGATION     Patient Active Problem List   Diagnosis Date Noted   Primary osteoarthritis of left knee 11/10/2023   OA (osteoarthritis) of knee 09/09/2022   Exertional dyspnea 04/17/2022   Elevated coronary artery calcium  score 04/17/2022   Spondylolisthesis at L4-L5 level 12/27/2021   Ductal carcinoma in situ (DCIS) of left breast 01/19/2018   Generalized anxiety disorder 11/07/2017   Depression 11/07/2017   Insomnia 11/07/2017   Cervical spondylosis 03/19/2013    PCP: Onita, MD  REFERRING PROVIDER: Bethanie, PA  REFERRING DIAG: s/p left TKA  THERAPY DIAG:  Acute pain of left knee  Stiffness of left knee, not elsewhere classified  Localized edema  Difficulty in walking, not elsewhere classified  Chronic bilateral back pain, unspecified back location  Rationale for Evaluation and Treatment: Rehabilitation  ONSET DATE: 11/10/23  SUBJECTIVE:   SUBJECTIVE STATEMENT: Doing okay, trying to move often and stay with my mobility  Patient underwent a left TKA on 11/10/23, she reports that she is doing okay, reports that she had noodle leg, reports trying to do the exercises daily  PERTINENT HISTORY: 63 yo female s/p L TKA on 11/10/23. PMH: R TKA, back pain, L3-4, 4-5 fusion in 2023, HTN, DM, C-spine arthroplasty  PAIN:  Are you having pain? Yes: NPRS scale: 9/10 Pain location: left knee Pain description: ache, sharpe , tender Aggravating factors: moving, walking ,  bending, I can't straighten the leg pain up to 10/10 Relieving factors: pain meds, ice, rest, elevation at best pain a 7/10  PRECAUTIONS: Other: back surgery in the past  RED FLAGS: None   WEIGHT BEARING RESTRICTIONS: No  FALLS:  Has patient fallen in last 6 months? No  LIVING ENVIRONMENT: Lives with: lives with their family Lives in: House/apartment Stairs: Yes: Internal: 20 steps; can reach both Has following equipment at home: Single point cane, Walker - 2 wheeled, shower chair, and Grab bars  OCCUPATION: volunteers  PLOF: Independent and no device was doing yoga  PATIENT GOALS: walk without device, have no pain, normal ROM, go up and down stairs step over step  NEXT MD VISIT: November 3  OBJECTIVE:  Note: Objective measures were completed at Evaluation unless otherwise noted.  DIAGNOSTIC FINDINGS: none  COGNITION: Overall cognitive status: Within functional limits for tasks assessed     SENSATION: WFL  EDEMA:  Circumferential: right mid patella 44cm, left 49 cm  MUSCLE LENGTH: Not tested  POSTURE: rounded shoulders, forward head, and knee guarded   PALPATION: Very sore and tender, some bruising in the thigh, very red in the medial knee with heat  LOWER EXTREMITY ROM:  Active ROM LEFT AROM 11/13/23 eval Left PROM 11/13/23 eval AROM LEFT 11/24/23  Hip flexion     Hip extension     Hip abduction     Hip adduction     Hip internal rotation     Hip external rotation     Knee flexion 80 85 95  Knee extension 25 20 20   Ankle dorsiflexion     Ankle plantarflexion     Ankle inversion     Ankle eversion      (Blank rows = not tested)  LOWER EXTREMITY MMT:  MMT Right eval Left eval  Hip flexion    Hip extension    Hip abduction    Hip adduction    Hip internal rotation    Hip external rotation    Knee flexion    Knee extension    Ankle dorsiflexion    Ankle plantarflexion    Ankle inversion    Ankle eversion     (Blank rows = not  tested)  FUNCTIONAL TESTS:  5 times sit to stand: 35 seconds Timed up and go (TUG): 56 seconds with FWW  GAIT: Distance walked: 60 feet Assistive device utilized: Walker - 2 wheeled Level of assistance: CGA Comments: step to pattern, shortened stance phase on the left  TREATMENT DATE:  12/01/23 Nustep level 5 x 6 minutes Bike level 2 x 6 minutes Leg curls 20# 2x10 5# ext 20# and no weight leg press working on flexion 8 toe clears 8 lunge stretch PAssive stretch flexion and extension STM to the scar and the thigh Demo of HEP for LLLD stretch for extension Vaso medium pressure 34 degrees  11/27/23 Bike full revs 6 minutes Nustep level 5 x 5 minutes 4 stairs up and down step over step 20# leg curls 20# Leg press  then no weight for ROM 2.5# LAQ 12 toe clears and then lunge stretch Passive stretch flexion and extension STM to the scar Vaso medium pressure 34 degrees  11/26/23 Nustep level 5 x 5 minutes Bike partial revs to full revs 5 minutes Leg curls 20# 20# leg press and then no weight working on flexion, then left only no weight small ROM Gait outside with SPC and CGA curbs 4 step up and down step over step Passive stretch flexion and extension 2# LAQ 2x10 some cues for TKE Feet on ball K2C, bridge Vaso 34 degrees  11/24/23 Bike partial revs x 6 minutes 20# leg curls 4 step over steps 3# LAQ Gait outside with SPC down curb and back with CGA LEg press 20# just working on flexion Passive stretch Scar mobilizations ROM measured as noted above Feet on ball K2C, rotation, bridge, isometric abs Vaso medium pressure in elevation 34 degrees  11/20/23 6 toe clears Step lunge strech 20# HS curls, then left only 10# Nustep level 5 x 5 minutes Bike partial revs Gait with cues to bend knee and having her exaggerate the bend at toe  off Feet on ball K2C, bridge SAQ Passive left knee stretch Vaso medium pressure 34 degrees  11/19/23 Nustep level 3 x 6 minutes Stairs step over step 4and 6 going up step over step Second step lunge stretch Bike partial revs x 4 minutes 20# HS curls Gait with SPC, cues verbal CGA Passive stretch left knee, gentle STM to the left thigh, scar mobs Vaso left knee 34 degrees   11/17/23 Nustep level 5 x 5 minutes STM to the quad and scar very gentle Gentle scar and patellar mobs 6 toe clears 6 lunge stretch 20# HS curls 2.5# LAQ Gait with walker, verbal and tactile cues to bend knee at toe off Some walking with HHA and walking with SPC again cues needed Feet on ball K2C, bridge Vaso medium pressure 37 degrees in elevation  11/13/23 Evaluation SAQ Vaso Medium pressure 37 degrees left knee     PATIENT EDUCATION:  Education details: reviewed RICE, gait, HEP Person educated: Patient Education method: Explanation, Demonstration, Tactile cues, Verbal cues, and Handouts Education comprehension: verbalized understanding, returned demonstration, verbal cues required, and tactile cues required  HOME EXERCISE PROGRAM: QS, chair scoots for flexion, ankle pumps, SLR, LAQ  ASSESSMENT:  CLINICAL IMPRESSION: Continues to come in stiff, however I can get her pretty good ROM after a little stretching.  She is more resistant for extension stretch so I did focus on the at today.  Demo of LLD stretch for extension for HEP  Patient is a 63 y.o. female who was seen today for physical therapy evaluation and treatment for s/p left TKA on 11/10/23.  She is in a lot pain, has significant tenderness, a lot of redness and warmth in the medial knee.  Reports that she cannot raise the foot up without help but with encouragement she could, was able to do a  SAQ.  TUG time was 56 seconds.   Has a lot of edema.   OBJECTIVE IMPAIRMENTS: Abnormal gait, cardiopulmonary status limiting activity,  decreased activity tolerance, decreased balance, decreased coordination, decreased endurance, decreased mobility, difficulty walking, decreased ROM, decreased strength, increased edema, increased muscle spasms, impaired flexibility, and pain.    REHAB POTENTIAL: Good  CLINICAL DECISION MAKING: Evolving/moderate complexity  EVALUATION COMPLEXITY: Moderate   GOALS: Goals reviewed with patient? Yes  SHORT TERM GOALS: Target date: 12/06/23 Independent with initial HEP Baseline: Goal status: met 11/19/23  LONG TERM GOALS: Target date: 04/12/24  Independent with advanced HEP Baseline:  Goal status: INITIAL  2.  Walk with LRAD all distances with minimal deviations Baseline:  Goal status:progressing 11/24/23  3.  Increase AROM of the left knee to 0-120 degrees flexion Baseline:  Goal status: progressing 11/27/23  4.  Decrease pain 50% Baseline:  Goal status: INITIAL  5.  Decrease TUG time to 18 seconds Baseline:  Goal status: INITIAL  6.  Go up and down stairs step over step Baseline:  Goal status: INITIAL   PLAN:  PT FREQUENCY: 3x/week to start and then will decrease as she improves  PT DURATION: 12 weeks  PLANNED INTERVENTIONS: 97164- PT Re-evaluation, 97110-Therapeutic exercises, 97530- Therapeutic activity, 97112- Neuromuscular re-education, 97535- Self Care, [id number]- Manual therapy, [phone]- Gait training, [phone]- Electrical stimulation (unattended), 97016- Vasopneumatic device, Patient/Family education, Balance training, Stair training, Joint mobilization, and Cryotherapy  PLAN FOR NEXT SESSION: work on motion, mm activation and walking   LIBERTY MEDIA, PT 12/01/2023, 3:30 PM

## 2023-12-03 ENCOUNTER — Ambulatory Visit: Admitting: Physical Therapy

## 2023-12-03 ENCOUNTER — Encounter: Payer: Self-pay | Admitting: Physical Therapy

## 2023-12-03 DIAGNOSIS — M25662 Stiffness of left knee, not elsewhere classified: Secondary | ICD-10-CM

## 2023-12-03 DIAGNOSIS — M25562 Pain in left knee: Secondary | ICD-10-CM

## 2023-12-03 DIAGNOSIS — R600 Localized edema: Secondary | ICD-10-CM

## 2023-12-03 DIAGNOSIS — R262 Difficulty in walking, not elsewhere classified: Secondary | ICD-10-CM

## 2023-12-03 NOTE — Therapy (Signed)
 OUTPATIENT PHYSICAL THERAPY LOWER EXTREMITY TREATMENT   Patient Name: [name] MRN: [id number] DOB:[birth year], 63 y.o., female Today's Date: 12/03/2023  END OF SESSION:  PT End of Session - 12/03/23 1558     Visit Number 9    Date for Recertification  02/13/24    PT Start Time 1559    PT Stop Time 1650    PT Time Calculation (min) 51 min    Activity Tolerance Patient tolerated treatment well;Patient limited by pain    Behavior During Therapy [facility] for tasks assessed/performed          Past Medical History:  Diagnosis Date   Anxiety    Arthritis    Back pain of lumbar region with sciatica    Cancer (HCC)    breast   Depression    Family history of anesthesia complication    grandfather died under anesthesia 70's- had heart issues   H/O carpal tunnel repair 2014   Headache(784.0)    Hyperlipidemia    Hypertension    Hypothyroidism    Medial meniscus tear    PONV (postoperative nausea and vomiting)    Pre-diabetes    Sleep apnea    cpap since 10   Past Surgical History:  Procedure Laterality Date   BILATERAL TOTAL MASTECTOMY WITH AXILLARY LYMPH NODE DISSECTION     BREAST RECONSTRUCTION Left 07/17/2023   for encapsulation,  done at [facility]   CERVICAL DISC ARTHROPLASTY N/A 03/19/2013   Procedure: CERVICAL FIVE TO SIX, CERVICAL SIX TO SEVEN CERVICAL ANTERIOR DISC ARTHROPLASTY;  Surgeon: Victory Gens, MD;  Location: [facility];  Service: Neurosurgery;  Laterality: N/A;  C56 C67 artificial disc replacement   CESAREAN SECTION     COLONOSCOPY     DILATATION & CURETTAGE/HYSTEROSCOPY WITH TRUECLEAR N/A 11/25/2013   Procedure: DILATATION & CURETTAGE/HYSTEROSCOPY WITH TRUCLEAR;  Surgeon: Charlie CHRISTELLA Croak, MD;  Location: [facility];  Service: Gynecology;  Laterality: N/A;   ECTOPIC PREGNANCY SURGERY     GALLBLADDER SURGERY     MOUTH SURGERY     child- dog bite   SPINAL FUSION     2023   STERIOD INJECTION Left 09/09/2022   Procedure: LEFT KNEE STEROID INJECTION;   Surgeon: Melodi Lerner, MD;  Location: [facility];  Service: Orthopedics;  Laterality: Left;  left knee injection 0928   TONSILLECTOMY     TOTAL KNEE ARTHROPLASTY Right 09/09/2022   Procedure: TOTAL KNEE ARTHROPLASTY;  Surgeon: Melodi Lerner, MD;  Location: [facility];  Service: Orthopedics;  Laterality: Right;   TOTAL KNEE ARTHROPLASTY Left 11/10/2023   Procedure: ARTHROPLASTY, KNEE, TOTAL;  Surgeon: Melodi Lerner, MD;  Location: [facility];  Service: Orthopedics;  Laterality: Left;   TUBAL LIGATION     Patient Active Problem List   Diagnosis Date Noted   Primary osteoarthritis of left knee 11/10/2023   OA (osteoarthritis) of knee 09/09/2022   Exertional dyspnea 04/17/2022   Elevated coronary artery calcium  score 04/17/2022   Spondylolisthesis at L4-L5 level 12/27/2021   Ductal carcinoma in situ (DCIS) of left breast 01/19/2018   Generalized anxiety disorder 11/07/2017   Depression 11/07/2017   Insomnia 11/07/2017   Cervical spondylosis 03/19/2013    PCP: Onita, MD  REFERRING PROVIDER: Bethanie, PA  REFERRING DIAG: s/p left TKA  THERAPY DIAG:  Acute pain of left knee  Stiffness of left knee, not elsewhere classified  Localized edema  Difficulty in walking, not elsewhere classified  Rationale for Evaluation and Treatment: Rehabilitation  ONSET DATE: 11/10/23  SUBJECTIVE:  SUBJECTIVE STATEMENT: Just like, its tight  Patient underwent a left TKA on 11/10/23, she reports that she is doing okay, reports that she had noodle leg, reports trying to do the exercises daily  PERTINENT HISTORY: 63 yo female s/p L TKA on 11/10/23. PMH: R TKA, back pain, L3-4, 4-5 fusion in 2023, HTN, DM, C-spine arthroplasty  PAIN:  Are you having pain? Yes: NPRS scale: 6/10 Pain location: left knee Pain description: ache, sharpe , tender Aggravating factors: moving, walking , bending, I can't straighten the leg pain up to 10/10 Relieving factors: pain meds, ice, rest, elevation at best pain a  7/10  PRECAUTIONS: Other: back surgery in the past  RED FLAGS: None   WEIGHT BEARING RESTRICTIONS: No  FALLS:  Has patient fallen in last 6 months? No  LIVING ENVIRONMENT: Lives with: lives with their family Lives in: House/apartment Stairs: Yes: Internal: 20 steps; can reach both Has following equipment at home: Single point cane, Walker - 2 wheeled, shower chair, and Grab bars  OCCUPATION: volunteers  PLOF: Independent and no device was doing yoga  PATIENT GOALS: walk without device, have no pain, normal ROM, go up and down stairs step over step  NEXT MD VISIT: November 3  OBJECTIVE:  Note: Objective measures were completed at Evaluation unless otherwise noted.  DIAGNOSTIC FINDINGS: none  COGNITION: Overall cognitive status: Within functional limits for tasks assessed     SENSATION: WFL  EDEMA:  Circumferential: right mid patella 44cm, left 49 cm  MUSCLE LENGTH: Not tested  POSTURE: rounded shoulders, forward head, and knee guarded   PALPATION: Very sore and tender, some bruising in the thigh, very red in the medial knee with heat  LOWER EXTREMITY ROM:  Active ROM LEFT AROM 11/13/23 eval Left PROM 11/13/23 eval AROM LEFT 11/24/23  Hip flexion     Hip extension     Hip abduction     Hip adduction     Hip internal rotation     Hip external rotation     Knee flexion 80 85 95  Knee extension 25 20 20   Ankle dorsiflexion     Ankle plantarflexion     Ankle inversion     Ankle eversion      (Blank rows = not tested)  LOWER EXTREMITY MMT:  MMT Right eval Left eval  Hip flexion    Hip extension    Hip abduction    Hip adduction    Hip internal rotation    Hip external rotation    Knee flexion    Knee extension    Ankle dorsiflexion    Ankle plantarflexion    Ankle inversion    Ankle eversion     (Blank rows = not tested)  FUNCTIONAL TESTS:  5 times sit to stand: 35 seconds Timed up and go (TUG): 56 seconds with  FWW  GAIT: Distance walked: 60 feet Assistive device utilized: Walker - 2 wheeled Level of assistance: CGA Comments: step to pattern, shortened stance phase on the left  TREATMENT DATE:  12/03/23 Nustep level 5 x 6 minutes Bike level 2 x 4 minutes Leg curls 20# 2x15 Leg Ext 5lb 2x10 LLE PROM w end range holds   Patellar mobs  Sit to stands 2x10 4in step up x10  6in x 3 each Resisted gait 30lb 4 way x 3 each  Vaso medium pressure 34 degrees  12/01/23 Nustep level 5 x 6 minutes Bike level 2 x 6 minutes Leg curls 20# 2x10 5# ext 20# and no weight leg press working on flexion 8 toe clears 8 lunge stretch PAssive stretch flexion and extension STM to the scar and the thigh Demo of HEP for LLLD stretch for extension Vaso medium pressure 34 degrees  11/27/23 Bike full revs 6 minutes Nustep level 5 x 5 minutes 4 stairs up and down step over step 20# leg curls 20# Leg press  then no weight for ROM 2.5# LAQ 12 toe clears and then lunge stretch Passive stretch flexion and extension STM to the scar Vaso medium pressure 34 degrees  11/26/23 Nustep level 5 x 5 minutes Bike partial revs to full revs 5 minutes Leg curls 20# 20# leg press and then no weight working on flexion, then left only no weight small ROM Gait outside with SPC and CGA curbs 4 step up and down step over step Passive stretch flexion and extension 2# LAQ 2x10 some cues for TKE Feet on ball K2C, bridge Vaso 34 degrees  11/24/23 Bike partial revs x 6 minutes 20# leg curls 4 step over steps 3# LAQ Gait outside with SPC down curb and back with CGA LEg press 20# just working on flexion Passive stretch Scar mobilizations ROM measured as noted above Feet on ball K2C, rotation, bridge, isometric abs Vaso medium pressure in elevation 34 degrees  11/20/23 6 toe  clears Step lunge strech 20# HS curls, then left only 10# Nustep level 5 x 5 minutes Bike partial revs Gait with cues to bend knee and having her exaggerate the bend at toe off Feet on ball K2C, bridge SAQ Passive left knee stretch Vaso medium pressure 34 degrees  11/19/23 Nustep level 3 x 6 minutes Stairs step over step 4and 6 going up step over step Second step lunge stretch Bike partial revs x 4 minutes 20# HS curls Gait with SPC, cues verbal CGA Passive stretch left knee, gentle STM to the left thigh, scar mobs Vaso left knee 34 degrees   11/17/23 Nustep level 5 x 5 minutes STM to the quad and scar very gentle Gentle scar and patellar mobs 6 toe clears 6 lunge stretch 20# HS curls 2.5# LAQ Gait with walker, verbal and tactile cues to bend knee at toe off Some walking with HHA and walking with SPC again cues needed Feet on ball K2C, bridge Vaso medium pressure 37 degrees in elevation  11/13/23 Evaluation SAQ Vaso Medium pressure 37 degrees left knee     PATIENT EDUCATION:  Education details: reviewed RICE, gait, HEP Person educated: Patient Education method: Explanation, Demonstration, Tactile cues, Verbal cues, and Handouts Education comprehension: verbalized understanding, returned demonstration, verbal cues required, and tactile cues required  HOME EXERCISE PROGRAM: QS, chair scoots for flexion, ankle pumps, SLR, LAQ  ASSESSMENT:  CLINICAL IMPRESSION: Continues to come in stiff and ambulates with rigid LLE despite having adequate ROM.  Some end rang pain with passive L knee extension. Strong tactile cues needed to prevent hip compensation with step ups. Cue to increase hip and knee flex with resisted gait.  Patient is a 63 y.o. female who was seen today for physical therapy evaluation and treatment for s/p left TKA on 11/10/23.  She is in a lot pain, has significant tenderness, a lot of redness and warmth in the medial knee.  Reports that she cannot  raise the foot up without help but with encouragement she could, was able to do a SAQ.  TUG time was 56 seconds.   Has a lot of edema.   OBJECTIVE IMPAIRMENTS: Abnormal gait, cardiopulmonary status limiting activity, decreased activity tolerance, decreased balance, decreased coordination, decreased endurance, decreased mobility, difficulty walking, decreased ROM, decreased strength, increased edema, increased muscle spasms, impaired flexibility, and pain.    REHAB POTENTIAL: Good  CLINICAL DECISION MAKING: Evolving/moderate complexity  EVALUATION COMPLEXITY: Moderate   GOALS: Goals reviewed with patient? Yes  SHORT TERM GOALS: Target date: 12/06/23 Independent with initial HEP Baseline: Goal status: met 11/19/23  LONG TERM GOALS: Target date: 04/12/24  Independent with advanced HEP Baseline:  Goal status: INITIAL  2.  Walk with LRAD all distances with minimal deviations Baseline:  Goal status:progressing 11/24/23  3.  Increase AROM of the left knee to 0-120 degrees flexion Baseline:  Goal status: progressing 11/27/23  4.  Decrease pain 50% Baseline:  Goal status: INITIAL  5.  Decrease TUG time to 18 seconds Baseline:  Goal status: INITIAL  6.  Go up and down stairs step over step Baseline:  Goal status: INITIAL   PLAN:  PT FREQUENCY: 3x/week to start and then will decrease as she improves  PT DURATION: 12 weeks  PLANNED INTERVENTIONS: 97164- PT Re-evaluation, 97110-Therapeutic exercises, 97530- Therapeutic activity, 97112- Neuromuscular re-education, 97535- Self Care, [id number]- Manual therapy, [phone]- Gait training, [phone]- Electrical stimulation (unattended), 97016- Vasopneumatic device, Patient/Family education, Balance training, Stair training, Joint mobilization, and Cryotherapy  PLAN FOR NEXT SESSION: work on motion, mm activation and walking   Tanda KANDICE Sorrow, PTA 12/03/2023, 4:00 PM

## 2023-12-04 ENCOUNTER — Ambulatory Visit: Admitting: Physical Therapy

## 2023-12-04 DIAGNOSIS — R600 Localized edema: Secondary | ICD-10-CM

## 2023-12-04 DIAGNOSIS — G8929 Other chronic pain: Secondary | ICD-10-CM

## 2023-12-04 DIAGNOSIS — R262 Difficulty in walking, not elsewhere classified: Secondary | ICD-10-CM

## 2023-12-04 DIAGNOSIS — M25662 Stiffness of left knee, not elsewhere classified: Secondary | ICD-10-CM

## 2023-12-04 DIAGNOSIS — M25562 Pain in left knee: Secondary | ICD-10-CM | POA: Diagnosis not present

## 2023-12-04 NOTE — Therapy (Signed)
 OUTPATIENT PHYSICAL THERAPY LOWER EXTREMITY TREATMENT   Patient Name: [name] MRN: [id number] DOB:[birth year], 63 y.o., female Today's Date: 12/04/2023  END OF SESSION:  PT End of Session - 12/04/23 1532     Visit Number 10    Date for Recertification  02/13/24    Authorization Type [employer]    PT Start Time 1530    PT Stop Time 1625    PT Time Calculation (min) 55 min    Activity Tolerance Patient tolerated treatment well;Patient limited by pain    Behavior During Therapy [facility] for tasks assessed/performed          Past Medical History:  Diagnosis Date   Anxiety    Arthritis    Back pain of lumbar region with sciatica    Cancer (HCC)    breast   Depression    Family history of anesthesia complication    grandfather died under anesthesia 70's- had heart issues   H/O carpal tunnel repair 2014   Headache(784.0)    Hyperlipidemia    Hypertension    Hypothyroidism    Medial meniscus tear    PONV (postoperative nausea and vomiting)    Pre-diabetes    Sleep apnea    cpap since 10   Past Surgical History:  Procedure Laterality Date   BILATERAL TOTAL MASTECTOMY WITH AXILLARY LYMPH NODE DISSECTION     BREAST RECONSTRUCTION Left 07/17/2023   for encapsulation,  done at [facility]   CERVICAL DISC ARTHROPLASTY N/A 03/19/2013   Procedure: CERVICAL FIVE TO SIX, CERVICAL SIX TO SEVEN CERVICAL ANTERIOR DISC ARTHROPLASTY;  Surgeon: Victory Gens, MD;  Location: [facility];  Service: Neurosurgery;  Laterality: N/A;  C56 C67 artificial disc replacement   CESAREAN SECTION     COLONOSCOPY     DILATATION & CURETTAGE/HYSTEROSCOPY WITH TRUECLEAR N/A 11/25/2013   Procedure: DILATATION & CURETTAGE/HYSTEROSCOPY WITH TRUCLEAR;  Surgeon: Charlie CHRISTELLA Croak, MD;  Location: [facility];  Service: Gynecology;  Laterality: N/A;   ECTOPIC PREGNANCY SURGERY     GALLBLADDER SURGERY     MOUTH SURGERY     child- dog bite   SPINAL FUSION     2023   STERIOD INJECTION Left 09/09/2022   Procedure: LEFT  KNEE STEROID INJECTION;  Surgeon: Melodi Lerner, MD;  Location: [facility];  Service: Orthopedics;  Laterality: Left;  left knee injection 0928   TONSILLECTOMY     TOTAL KNEE ARTHROPLASTY Right 09/09/2022   Procedure: TOTAL KNEE ARTHROPLASTY;  Surgeon: Melodi Lerner, MD;  Location: [facility];  Service: Orthopedics;  Laterality: Right;   TOTAL KNEE ARTHROPLASTY Left 11/10/2023   Procedure: ARTHROPLASTY, KNEE, TOTAL;  Surgeon: Melodi Lerner, MD;  Location: [facility];  Service: Orthopedics;  Laterality: Left;   TUBAL LIGATION     Patient Active Problem List   Diagnosis Date Noted   Primary osteoarthritis of left knee 11/10/2023   OA (osteoarthritis) of knee 09/09/2022   Exertional dyspnea 04/17/2022   Elevated coronary artery calcium  score 04/17/2022   Spondylolisthesis at L4-L5 level 12/27/2021   Ductal carcinoma in situ (DCIS) of left breast 01/19/2018   Generalized anxiety disorder 11/07/2017   Depression 11/07/2017   Insomnia 11/07/2017   Cervical spondylosis 03/19/2013    PCP: Onita, MD  REFERRING PROVIDER: Bethanie, PA  REFERRING DIAG: s/p left TKA  THERAPY DIAG:  Acute pain of left knee  Stiffness of left knee, not elsewhere classified  Localized edema  Difficulty in walking, not elsewhere classified  Chronic bilateral back pain, unspecified back location  Rationale for Evaluation and Treatment: Rehabilitation  ONSET DATE: 11/10/23  SUBJECTIVE:   SUBJECTIVE STATEMENT: I am really trying to walk better like you guys said last time, I am also trying the stairs at home  Patient underwent a left TKA on 11/10/23, she reports that she is doing okay, reports that she had noodle leg, reports trying to do the exercises daily  PERTINENT HISTORY: 63 yo female s/p L TKA on 11/10/23. PMH: R TKA, back pain, L3-4, 4-5 fusion in 2023, HTN, DM, C-spine arthroplasty  PAIN:  Are you having pain? Yes: NPRS scale: 6/10 Pain location: left knee Pain description: ache, sharpe ,  tender Aggravating factors: moving, walking , bending, I can't straighten the leg pain up to 10/10 Relieving factors: pain meds, ice, rest, elevation at best pain a 7/10  PRECAUTIONS: Other: back surgery in the past  RED FLAGS: None   WEIGHT BEARING RESTRICTIONS: No  FALLS:  Has patient fallen in last 6 months? No  LIVING ENVIRONMENT: Lives with: lives with their family Lives in: House/apartment Stairs: Yes: Internal: 20 steps; can reach both Has following equipment at home: Single point cane, Walker - 2 wheeled, shower chair, and Grab bars  OCCUPATION: volunteers  PLOF: Independent and no device was doing yoga  PATIENT GOALS: walk without device, have no pain, normal ROM, go up and down stairs step over step  NEXT MD VISIT: November 3  OBJECTIVE:  Note: Objective measures were completed at Evaluation unless otherwise noted.  DIAGNOSTIC FINDINGS: none  COGNITION: Overall cognitive status: Within functional limits for tasks assessed     SENSATION: WFL  EDEMA:  Circumferential: right mid patella 44cm, left 49 cm  MUSCLE LENGTH: Not tested  POSTURE: rounded shoulders, forward head, and knee guarded   PALPATION: Very sore and tender, some bruising in the thigh, very red in the medial knee with heat  LOWER EXTREMITY ROM:  Active ROM LEFT AROM 11/13/23 eval Left PROM 11/13/23 eval AROM LEFT 11/24/23  Hip flexion     Hip extension     Hip abduction     Hip adduction     Hip internal rotation     Hip external rotation     Knee flexion 80 85 95  Knee extension 25 20 20   Ankle dorsiflexion     Ankle plantarflexion     Ankle inversion     Ankle eversion      (Blank rows = not tested)  LOWER EXTREMITY MMT:  MMT Right eval Left eval  Hip flexion    Hip extension    Hip abduction    Hip adduction    Hip internal rotation    Hip external rotation    Knee flexion    Knee extension    Ankle dorsiflexion    Ankle plantarflexion    Ankle inversion     Ankle eversion     (Blank rows = not tested)  FUNCTIONAL TESTS:  5 times sit to stand: 35 seconds Timed up and go (TUG): 56 seconds with FWW  GAIT: Distance walked: 60 feet Assistive device utilized: Walker - 2 wheeled Level of assistance: CGA Comments: step to pattern, shortened stance phase on the left  TREATMENT DATE:  12/04/23 Nustep level 5 x 5 minutes Bike level 3 x 5 minutes seat position 8 with some trunk lean Walk to other building with SPC then stairs step over step a lot of verbal and tactile cues Leg press 20#  Gait with cues for bending at toe off Passive stretch extension and flexion Vaso 34 degrees in elevation  12/03/23 Nustep level 5 x 6 minutes Bike level 2 x 4 minutes Leg curls 20# 2x15 Leg Ext 5lb 2x10 LLE PROM w end range holds   Patellar mobs  Sit to stands 2x10 4in step up x10  6in x 3 each Resisted gait 30lb 4 way x 3 each  Vaso medium pressure 34 degrees  12/01/23 Nustep level 5 x 6 minutes Bike level 2 x 6 minutes Leg curls 20# 2x10 5# ext 20# and no weight leg press working on flexion 8 toe clears 8 lunge stretch PAssive stretch flexion and extension STM to the scar and the thigh Demo of HEP for LLLD stretch for extension Vaso medium pressure 34 degrees  11/27/23 Bike full revs 6 minutes Nustep level 5 x 5 minutes 4 stairs up and down step over step 20# leg curls 20# Leg press  then no weight for ROM 2.5# LAQ 12 toe clears and then lunge stretch Passive stretch flexion and extension STM to the scar Vaso medium pressure 34 degrees  11/26/23 Nustep level 5 x 5 minutes Bike partial revs to full revs 5 minutes Leg curls 20# 20# leg press and then no weight working on flexion, then left only no weight small ROM Gait outside with SPC and CGA curbs 4 step up and down step over step Passive  stretch flexion and extension 2# LAQ 2x10 some cues for TKE Feet on ball K2C, bridge Vaso 34 degrees  11/24/23 Bike partial revs x 6 minutes 20# leg curls 4 step over steps 3# LAQ Gait outside with SPC down curb and back with CGA LEg press 20# just working on flexion Passive stretch Scar mobilizations ROM measured as noted above Feet on ball K2C, rotation, bridge, isometric abs Vaso medium pressure in elevation 34 degrees  11/20/23 6 toe clears Step lunge strech 20# HS curls, then left only 10# Nustep level 5 x 5 minutes Bike partial revs Gait with cues to bend knee and having her exaggerate the bend at toe off Feet on ball K2C, bridge SAQ Passive left knee stretch Vaso medium pressure 34 degrees  11/19/23 Nustep level 3 x 6 minutes Stairs step over step 4and 6 going up step over step Second step lunge stretch Bike partial revs x 4 minutes 20# HS curls Gait with SPC, cues verbal CGA Passive stretch left knee, gentle STM to the left thigh, scar mobs Vaso left knee 34 degrees   11/17/23 Nustep level 5 x 5 minutes STM to the quad and scar very gentle Gentle scar and patellar mobs 6 toe clears 6 lunge stretch 20# HS curls 2.5# LAQ Gait with walker, verbal and tactile cues to bend knee at toe off Some walking with HHA and walking with SPC again cues needed Feet on ball K2C, bridge Vaso medium pressure 37 degrees in elevation  11/13/23 Evaluation SAQ Vaso Medium pressure 37 degrees left knee     PATIENT EDUCATION:  Education details: reviewed RICE, gait, HEP Person educated: Patient Education method: Explanation, Demonstration, Tactile cues, Verbal cues, and Handouts Education comprehension: verbalized understanding, returned demonstration, verbal cues required, and tactile cues required  HOME EXERCISE  PROGRAM: QS, chair scoots for flexion, ankle pumps, SLR, LAQ  ASSESSMENT:  CLINICAL IMPRESSION: Patient came in walking better today, she reports  that she is really trying to think about it.  She wanted to try stairs , we did step over step up without much difficulty, she really struggled with going down, ands it seemed to be a mental thing, once she got going she did well.  Needed a lot of verbal and tactile cues to do this.  Patient is a 63 y.o. female who was seen today for physical therapy evaluation and treatment for s/p left TKA on 11/10/23.  She is in a lot pain, has significant tenderness, a lot of redness and warmth in the medial knee.  Reports that she cannot raise the foot up without help but with encouragement she could, was able to do a SAQ.  TUG time was 56 seconds.   Has a lot of edema.   OBJECTIVE IMPAIRMENTS: Abnormal gait, cardiopulmonary status limiting activity, decreased activity tolerance, decreased balance, decreased coordination, decreased endurance, decreased mobility, difficulty walking, decreased ROM, decreased strength, increased edema, increased muscle spasms, impaired flexibility, and pain.    REHAB POTENTIAL: Good  CLINICAL DECISION MAKING: Evolving/moderate complexity  EVALUATION COMPLEXITY: Moderate   GOALS: Goals reviewed with patient? Yes  SHORT TERM GOALS: Target date: 12/06/23 Independent with initial HEP Baseline: Goal status: met 11/19/23  LONG TERM GOALS: Target date: 04/12/24  Independent with advanced HEP Baseline:  Goal status: INITIAL  2.  Walk with LRAD all distances with minimal deviations Baseline:  Goal status:progressing 11/24/23  3.  Increase AROM of the left knee to 0-120 degrees flexion Baseline:  Goal status: progressing 11/27/23  4.  Decrease pain 50% Baseline:  Goal status: INITIAL  5.  Decrease TUG time to 18 seconds Baseline:  Goal status: INITIAL  6.  Go up and down stairs step over step Baseline:  Goal status: progressing 12/04/23   PLAN:  PT FREQUENCY: 3x/week to start and then will decrease as she improves  PT DURATION: 12 weeks  PLANNED  INTERVENTIONS: 97164- PT Re-evaluation, 97110-Therapeutic exercises, 97530- Therapeutic activity, 97112- Neuromuscular re-education, 97535- Self Care, [id number]- Manual therapy, [phone]- Gait training, [phone]- Electrical stimulation (unattended), 97016- Vasopneumatic device, Patient/Family education, Balance training, Stair training, Joint mobilization, and Cryotherapy  PLAN FOR NEXT SESSION: work on motion, mm activation and walking   LIBERTY [employer]IA, PT 12/04/2023, 3:33 PM

## 2023-12-08 ENCOUNTER — Ambulatory Visit: Admitting: Physical Therapy

## 2023-12-08 ENCOUNTER — Encounter: Payer: Self-pay | Admitting: Physical Therapy

## 2023-12-08 DIAGNOSIS — R600 Localized edema: Secondary | ICD-10-CM

## 2023-12-08 DIAGNOSIS — M25662 Stiffness of left knee, not elsewhere classified: Secondary | ICD-10-CM

## 2023-12-08 DIAGNOSIS — G8929 Other chronic pain: Secondary | ICD-10-CM

## 2023-12-08 DIAGNOSIS — M25562 Pain in left knee: Secondary | ICD-10-CM

## 2023-12-08 DIAGNOSIS — R262 Difficulty in walking, not elsewhere classified: Secondary | ICD-10-CM

## 2023-12-08 NOTE — Therapy (Signed)
 OUTPATIENT PHYSICAL THERAPY LOWER EXTREMITY TREATMENT   Patient Name: [name] MRN: [id number] DOB:September 17, 1960, 63 y.o., female Today's Date: 12/08/2023  END OF SESSION:  PT End of Session - 12/08/23 1521     Visit Number 11    Date for Recertification  02/13/24    Authorization Type [employer]    PT Start Time 1521    PT Stop Time 1620    PT Time Calculation (min) 59 min    Activity Tolerance Patient tolerated treatment well    Behavior During Therapy WFL for tasks assessed/performed          Past Medical History:  Diagnosis Date   Anxiety    Arthritis    Back pain of lumbar region with sciatica    Cancer (HCC)    breast   Depression    Family history of anesthesia complication    grandfather died under anesthesia 70's- had heart issues   H/O carpal tunnel repair 2014   Headache(784.0)    Hyperlipidemia    Hypertension    Hypothyroidism    Medial meniscus tear    PONV (postoperative nausea and vomiting)    Pre-diabetes    Sleep apnea    cpap since 10   Past Surgical History:  Procedure Laterality Date   BILATERAL TOTAL MASTECTOMY WITH AXILLARY LYMPH NODE DISSECTION     BREAST RECONSTRUCTION Left 07/17/2023   for encapsulation,  done at [facility]   CERVICAL DISC ARTHROPLASTY N/A 03/19/2013   Procedure: CERVICAL FIVE TO SIX, CERVICAL SIX TO SEVEN CERVICAL ANTERIOR DISC ARTHROPLASTY;  Surgeon: Victory Gens, MD;  Location: [facility];  Service: Neurosurgery;  Laterality: N/A;  C56 C67 artificial disc replacement   CESAREAN SECTION     COLONOSCOPY     DILATATION & CURETTAGE/HYSTEROSCOPY WITH TRUECLEAR N/A 11/25/2013   Procedure: DILATATION & CURETTAGE/HYSTEROSCOPY WITH TRUCLEAR;  Surgeon: Charlie CHRISTELLA Croak, MD;  Location: [facility];  Service: Gynecology;  Laterality: N/A;   ECTOPIC PREGNANCY SURGERY     GALLBLADDER SURGERY     MOUTH SURGERY     child- dog bite   SPINAL FUSION     2023   STERIOD INJECTION Left 09/09/2022   Procedure: LEFT KNEE STEROID INJECTION;   Surgeon: Melodi Lerner, MD;  Location: [facility];  Service: Orthopedics;  Laterality: Left;  left knee injection 0928   TONSILLECTOMY     TOTAL KNEE ARTHROPLASTY Right 09/09/2022   Procedure: TOTAL KNEE ARTHROPLASTY;  Surgeon: Melodi Lerner, MD;  Location: [facility];  Service: Orthopedics;  Laterality: Right;   TOTAL KNEE ARTHROPLASTY Left 11/10/2023   Procedure: ARTHROPLASTY, KNEE, TOTAL;  Surgeon: Melodi Lerner, MD;  Location: [facility];  Service: Orthopedics;  Laterality: Left;   TUBAL LIGATION     Patient Active Problem List   Diagnosis Date Noted   Primary osteoarthritis of left knee 11/10/2023   OA (osteoarthritis) of knee 09/09/2022   Exertional dyspnea 04/17/2022   Elevated coronary artery calcium  score 04/17/2022   Spondylolisthesis at L4-L5 level 12/27/2021   Ductal carcinoma in situ (DCIS) of left breast 01/19/2018   Generalized anxiety disorder 11/07/2017   Depression 11/07/2017   Insomnia 11/07/2017   Cervical spondylosis 03/19/2013    PCP: Onita, MD  REFERRING PROVIDER: Bethanie, PA  REFERRING DIAG: s/p left TKA  THERAPY DIAG:  Acute pain of left knee  Stiffness of left knee, not elsewhere classified  Localized edema  Difficulty in walking, not elsewhere classified  Chronic bilateral back pain, unspecified back location  Rationale for Evaluation  and Treatment: Rehabilitation  ONSET DATE: 11/10/23  SUBJECTIVE:   SUBJECTIVE STATEMENT: Doing okay, I am trying to do the bike at home and it gets pretty sore  Patient underwent a left TKA on 11/10/23, she reports that she is doing okay, reports that she had noodle leg, reports trying to do the exercises daily  PERTINENT HISTORY: 63 yo female s/p L TKA on 11/10/23. PMH: R TKA, back pain, L3-4, 4-5 fusion in 2023, HTN, DM, C-spine arthroplasty  PAIN:  Are you having pain? Yes: NPRS scale: 6/10 Pain location: left knee Pain description: ache, sharpe , tender Aggravating factors: moving, walking , bending, I  can't straighten the leg pain up to 10/10 Relieving factors: pain meds, ice, rest, elevation at best pain a 7/10  PRECAUTIONS: Other: back surgery in the past  RED FLAGS: None   WEIGHT BEARING RESTRICTIONS: No  FALLS:  Has patient fallen in last 6 months? No  LIVING ENVIRONMENT: Lives with: lives with their family Lives in: House/apartment Stairs: Yes: Internal: 20 steps; can reach both Has following equipment at home: Single point cane, Walker - 2 wheeled, shower chair, and Grab bars  OCCUPATION: volunteers  PLOF: Independent and no device was doing yoga  PATIENT GOALS: walk without device, have no pain, normal ROM, go up and down stairs step over step  NEXT MD VISIT: November 3  OBJECTIVE:  Note: Objective measures were completed at Evaluation unless otherwise noted.  DIAGNOSTIC FINDINGS: none  COGNITION: Overall cognitive status: Within functional limits for tasks assessed     SENSATION: WFL  EDEMA:  Circumferential: right mid patella 44cm, left 49 cm  MUSCLE LENGTH: Not tested  POSTURE: rounded shoulders, forward head, and knee guarded   PALPATION: Very sore and tender, some bruising in the thigh, very red in the medial knee with heat  LOWER EXTREMITY ROM:  Active ROM LEFT AROM 11/13/23 eval Left PROM 11/13/23 eval AROM LEFT 11/24/23 AROM LEft  12/08/23  Hip flexion      Hip extension      Hip abduction      Hip adduction      Hip internal rotation      Hip external rotation      Knee flexion 80 85 95 102 active Passive to 110  Knee extension 25 20 20 15   Ankle dorsiflexion      Ankle plantarflexion      Ankle inversion      Ankle eversion       (Blank rows = not tested)  LOWER EXTREMITY MMT:  MMT Right eval Left eval  Hip flexion    Hip extension    Hip abduction    Hip adduction    Hip internal rotation    Hip external rotation    Knee flexion    Knee extension    Ankle dorsiflexion    Ankle plantarflexion    Ankle  inversion    Ankle eversion     (Blank rows = not tested)  FUNCTIONAL TESTS:  5 times sit to stand: 35 seconds Timed up and go (TUG): 56 seconds with FWW  GAIT: Distance walked: 60 feet Assistive device utilized: Walker - 2 wheeled Level of assistance: CGA Comments: step to pattern, shortened stance phase on the left  TREATMENT DATE:  12/08/23 Bike full revs position #8 x 6 minutes Stairs step over step Tmill pushes 25# HS curls 5# Leg extension LEg press 20# both legs working on flexion Leg press no weight with the left only Passive stretch flexion and extension Feet on ball K2C, rotation, bridge, isometric abs Vaso medium pressure 34 degrees in elevation  12/04/23 Nustep level 5 x 5 minutes Bike level 3 x 5 minutes seat position 8 with some trunk lean Walk to other building with SPC then stairs step over step a lot of verbal and tactile cues Leg press 20#  Gait with cues for bending at toe off Passive stretch extension and flexion Vaso 34 degrees in elevation  12/03/23 Nustep level 5 x 6 minutes Bike level 2 x 4 minutes Leg curls 20# 2x15 Leg Ext 5lb 2x10 LLE PROM w end range holds   Patellar mobs  Sit to stands 2x10 4in step up x10  6in x 3 each Resisted gait 30lb 4 way x 3 each  Vaso medium pressure 34 degrees  12/01/23 Nustep level 5 x 6 minutes Bike level 2 x 6 minutes Leg curls 20# 2x10 5# ext 20# and no weight leg press working on flexion 8 toe clears 8 lunge stretch PAssive stretch flexion and extension STM to the scar and the thigh Demo of HEP for LLLD stretch for extension Vaso medium pressure 34 degrees  11/27/23 Bike full revs 6 minutes Nustep level 5 x 5 minutes 4 stairs up and down step over step 20# leg curls 20# Leg press  then no weight for ROM 2.5# LAQ 12 toe clears and then lunge stretch Passive  stretch flexion and extension STM to the scar Vaso medium pressure 34 degrees  11/26/23 Nustep level 5 x 5 minutes Bike partial revs to full revs 5 minutes Leg curls 20# 20# leg press and then no weight working on flexion, then left only no weight small ROM Gait outside with SPC and CGA curbs 4 step up and down step over step Passive stretch flexion and extension 2# LAQ 2x10 some cues for TKE Feet on ball K2C, bridge Vaso 34 degrees  11/24/23 Bike partial revs x 6 minutes 20# leg curls 4 step over steps 3# LAQ Gait outside with SPC down curb and back with CGA LEg press 20# just working on flexion Passive stretch Scar mobilizations ROM measured as noted above Feet on ball K2C, rotation, bridge, isometric abs Vaso medium pressure in elevation 34 degrees  11/20/23 6 toe clears Step lunge strech 20# HS curls, then left only 10# Nustep level 5 x 5 minutes Bike partial revs Gait with cues to bend knee and having her exaggerate the bend at toe off Feet on ball K2C, bridge SAQ Passive left knee stretch Vaso medium pressure 34 degrees  11/19/23 Nustep level 3 x 6 minutes Stairs step over step 4and 6 going up step over step Second step lunge stretch Bike partial revs x 4 minutes 20# HS curls Gait with SPC, cues verbal CGA Passive stretch left knee, gentle STM to the left thigh, scar mobs Vaso left knee 34 degrees   11/17/23 Nustep level 5 x 5 minutes STM to the quad and scar very gentle Gentle scar and patellar mobs 6 toe clears 6 lunge stretch 20# HS curls 2.5# LAQ Gait with walker, verbal and tactile cues to bend knee at toe off Some walking with HHA and walking with SPC again cues needed Feet on ball K2C, bridge Vaso medium  pressure 37 degrees in elevation  11/13/23 Evaluation SAQ Vaso Medium pressure 37 degrees left knee     PATIENT EDUCATION:  Education details: reviewed RICE, gait, HEP Person educated: Patient Education method: Explanation,  Demonstration, Tactile cues, Verbal cues, and Handouts Education comprehension: verbalized understanding, returned demonstration, verbal cues required, and tactile cues required  HOME EXERCISE PROGRAM: QS, chair scoots for flexion, ankle pumps, SLR, LAQ  ASSESSMENT:  CLINICAL IMPRESSION: Patient continues walking better today, we went and did the stairs again today, again she was very hesitant and slow for about 3 steps and then started going step over step really pretty good.  She did gain very good ROM over the past 2 weeks as noted above.  Tolerates with passive stretch.  Patient is a 63 y.o. female who was seen today for physical therapy evaluation and treatment for s/p left TKA on 11/10/23.  She is in a lot pain, has significant tenderness, a lot of redness and warmth in the medial knee.  Reports that she cannot raise the foot up without help but with encouragement she could, was able to do a SAQ.  TUG time was 56 seconds.   Has a lot of edema.   OBJECTIVE IMPAIRMENTS: Abnormal gait, cardiopulmonary status limiting activity, decreased activity tolerance, decreased balance, decreased coordination, decreased endurance, decreased mobility, difficulty walking, decreased ROM, decreased strength, increased edema, increased muscle spasms, impaired flexibility, and pain.    REHAB POTENTIAL: Good  CLINICAL DECISION MAKING: Evolving/moderate complexity  EVALUATION COMPLEXITY: Moderate   GOALS: Goals reviewed with patient? Yes  SHORT TERM GOALS: Target date: 12/06/23 Independent with initial HEP Baseline: Goal status: met 11/19/23  LONG TERM GOALS: Target date: 04/12/24  Independent with advanced HEP Baseline:  Goal status: INITIAL  2.  Walk with LRAD all distances with minimal deviations Baseline:  Goal status:progressing 12/08/23  3.  Increase AROM of the left knee to 0-120 degrees flexion Baseline:  Goal status: progressing 11/27/23  4.  Decrease pain 50% Baseline:  Goal  status: INITIAL  5.  Decrease TUG time to 18 seconds Baseline:  Goal status: progressing 12/08/23  6.  Go up and down stairs step over step Baseline:  Goal status: progressing 12/04/23   PLAN:  PT FREQUENCY: 3x/week to start and then will decrease as she improves  PT DURATION: 12 weeks  PLANNED INTERVENTIONS: 97164- PT Re-evaluation, 97110-Therapeutic exercises, 97530- Therapeutic activity, 97112- Neuromuscular re-education, 97535- Self Care, [id number]- Manual therapy, [phone]- Gait training, [phone]- Electrical stimulation (unattended), 97016- Vasopneumatic device, Patient/Family education, Balance training, Stair training, Joint mobilization, and Cryotherapy  PLAN FOR NEXT SESSION: work on motion, mm activation and walking   LIBERTY MEDIA, PT 12/08/2023, 3:22 PM

## 2023-12-10 ENCOUNTER — Encounter: Payer: Self-pay | Admitting: Professional Counselor

## 2023-12-10 ENCOUNTER — Ambulatory Visit: Admitting: Professional Counselor

## 2023-12-10 DIAGNOSIS — F411 Generalized anxiety disorder: Secondary | ICD-10-CM

## 2023-12-10 DIAGNOSIS — F330 Major depressive disorder, recurrent, mild: Secondary | ICD-10-CM | POA: Diagnosis not present

## 2023-12-10 NOTE — Progress Notes (Signed)
°      [facility] Counselor/Therapist Progress Note  Patient ID: [name], MRN: [id number],    Date: 12/10/2023  Time Spent: 1:06 PM to 2:00 PM  Treatment Type: Family with patient  Patient present with spouse for session.  Reported Symptoms: Anhedonia, low mood, sleep concerns, fatigue, appetite concerns, self-esteem concerns, trouble concentrating, anxiousness, worries, trouble relaxing, catastrophic thinking, interpersonal concerns, health concerns, phase of life concerns  Mental Status Exam:  Appearance:   Neat     Behavior:  Appropriate, Sharing, and Motivated  Motor:  Normal  Speech/Language:   Clear and Coherent and Normal Rate  Affect:  Appropriate and Congruent  Mood:  normal  Thought process:  normal  Thought content:    WNL  Sensory/Perceptual disturbances:    WNL  Orientation:  oriented to person, place, time/date, and situation  Attention:  Good  Concentration:  Good  Memory:  WNL  Fund of knowledge:   Good  Insight:    Good  Judgment:   Good  Impulse Control:  Good   Risk Assessment: Danger to Self:  No Self-injurious Behavior: No Danger to Others: No Duty to Warn:no Physical Aggression / Violence:No  Access to Firearms a concern: No  Gang Involvement:No   Subjective: Patient presented to session to address concerns of anxiety and depression.  She reported mixed progress at this time.  She reported her impulsive spending to have improved, and for her husband lifestyle choices of concern to have improved as well.  She and spouse reported overall improvement in their relationship.  Patient identified her knee surgery to have gone well, and for spouse to have been helpful.  She identified Bible study is keeping her busy and fortified.  Counselor actively listened and celebrated progress with patient and spouse.  Patient and spouse discussed concerns regarding intimacy.  They safety planned with counselor around daughter moving in with fianc in the new  year.  Counselor facilitated GAD-7 and patient scored an 8, PHQ-9 and patient scored an 8.  They discussed patient therapeutic gains, and patient identified therapy having been helpful regarding interpersonal effectiveness skills, symptom coping and emotional regulation skills, and increasing healthy communication, connection.  Patient identified intention to maintain her resolve and therapeutic gains into the new year, including as relates marital relationship.  Counselor and patient discussed and renewed patient treatment plan, patient giving her consent to goals for depression and anxiety.  Interventions: Solution-Oriented/Positive Psychology, Humanistic/Existential, Insight-Oriented, Interpersonal, and Treatment Planning  Diagnosis:   ICD-10-CM   1. Generalized anxiety disorder  F41.1     2. Major depressive disorder, recurrent episode, mild  F33.0       Plan: Patient is scheduled for follow-up; continue process work and developing coping skills.  Patient short-term goal between sessions to continue with spiritual resourcing, practicing interpersonal effectiveness skills, limiting spending and adhering to family budget, continue to prioritize self-care.  Almarie ONEIDA Sprang, [facility]

## 2023-12-11 ENCOUNTER — Ambulatory Visit: Admitting: Physical Therapy

## 2023-12-11 ENCOUNTER — Encounter: Payer: Self-pay | Admitting: Physical Therapy

## 2023-12-11 DIAGNOSIS — M25562 Pain in left knee: Secondary | ICD-10-CM

## 2023-12-11 DIAGNOSIS — R600 Localized edema: Secondary | ICD-10-CM

## 2023-12-11 DIAGNOSIS — M25662 Stiffness of left knee, not elsewhere classified: Secondary | ICD-10-CM

## 2023-12-11 DIAGNOSIS — G8929 Other chronic pain: Secondary | ICD-10-CM

## 2023-12-11 DIAGNOSIS — R262 Difficulty in walking, not elsewhere classified: Secondary | ICD-10-CM

## 2023-12-11 NOTE — Therapy (Signed)
 OUTPATIENT PHYSICAL THERAPY LOWER EXTREMITY TREATMENT   Patient Name: [name] MRN: [id number] DOB:24-May-1960, 63 y.o., female Today's Date: 12/11/2023  END OF SESSION:  PT End of Session - 12/11/23 1315     Visit Number 12    Date for Recertification  02/13/24    Authorization Type [employer]    PT Start Time 1314    PT Stop Time 1415    PT Time Calculation (min) 61 min    Activity Tolerance Patient tolerated treatment well    Behavior During Therapy WFL for tasks assessed/performed          Past Medical History:  Diagnosis Date   Anxiety    Arthritis    Back pain of lumbar region with sciatica    Cancer (HCC)    breast   Depression    Family history of anesthesia complication    grandfather died under anesthesia 70's- had heart issues   H/O carpal tunnel repair 2014   Headache(784.0)    Hyperlipidemia    Hypertension    Hypothyroidism    Medial meniscus tear    PONV (postoperative nausea and vomiting)    Pre-diabetes    Sleep apnea    cpap since 10   Past Surgical History:  Procedure Laterality Date   BILATERAL TOTAL MASTECTOMY WITH AXILLARY LYMPH NODE DISSECTION     BREAST RECONSTRUCTION Left 07/17/2023   for encapsulation,  done at [facility]   CERVICAL DISC ARTHROPLASTY N/A 03/19/2013   Procedure: CERVICAL FIVE TO SIX, CERVICAL SIX TO SEVEN CERVICAL ANTERIOR DISC ARTHROPLASTY;  Surgeon: Victory Gens, MD;  Location: [facility];  Service: Neurosurgery;  Laterality: N/A;  C56 C67 artificial disc replacement   CESAREAN SECTION     COLONOSCOPY     DILATATION & CURETTAGE/HYSTEROSCOPY WITH TRUECLEAR N/A 11/25/2013   Procedure: DILATATION & CURETTAGE/HYSTEROSCOPY WITH TRUCLEAR;  Surgeon: Charlie CHRISTELLA Croak, MD;  Location: [facility];  Service: Gynecology;  Laterality: N/A;   ECTOPIC PREGNANCY SURGERY     GALLBLADDER SURGERY     MOUTH SURGERY     child- dog bite   SPINAL FUSION     2023   STERIOD INJECTION Left 09/09/2022   Procedure: LEFT KNEE STEROID INJECTION;   Surgeon: Melodi Lerner, MD;  Location: [facility];  Service: Orthopedics;  Laterality: Left;  left knee injection 0928   TONSILLECTOMY     TOTAL KNEE ARTHROPLASTY Right 09/09/2022   Procedure: TOTAL KNEE ARTHROPLASTY;  Surgeon: Melodi Lerner, MD;  Location: [facility];  Service: Orthopedics;  Laterality: Right;   TOTAL KNEE ARTHROPLASTY Left 11/10/2023   Procedure: ARTHROPLASTY, KNEE, TOTAL;  Surgeon: Melodi Lerner, MD;  Location: [facility];  Service: Orthopedics;  Laterality: Left;   TUBAL LIGATION     Patient Active Problem List   Diagnosis Date Noted   Primary osteoarthritis of left knee 11/10/2023   OA (osteoarthritis) of knee 09/09/2022   Exertional dyspnea 04/17/2022   Elevated coronary artery calcium  score 04/17/2022   Spondylolisthesis at L4-L5 level 12/27/2021   Ductal carcinoma in situ (DCIS) of left breast 01/19/2018   Generalized anxiety disorder 11/07/2017   Depression 11/07/2017   Insomnia 11/07/2017   Cervical spondylosis 03/19/2013    PCP: Onita, MD  REFERRING PROVIDER: Bethanie, PA  REFERRING DIAG: s/p left TKA  THERAPY DIAG:  Acute pain of left knee  Stiffness of left knee, not elsewhere classified  Localized edema  Difficulty in walking, not elsewhere classified  Chronic bilateral back pain, unspecified back location  Rationale for Evaluation  and Treatment: Rehabilitation  ONSET DATE: 11/10/23  SUBJECTIVE:   SUBJECTIVE STATEMENT: I feel like I am stiff today  Patient underwent a left TKA on 11/10/23, she reports that she is doing okay, reports that she had noodle leg, reports trying to do the exercises daily  PERTINENT HISTORY: 63 yo female s/p L TKA on 11/10/23. PMH: R TKA, back pain, L3-4, 4-5 fusion in 2023, HTN, DM, C-spine arthroplasty  PAIN:  Are you having pain? Yes: NPRS scale: 6/10 Pain location: left knee Pain description: ache, sharpe , tender Aggravating factors: moving, walking , bending, I can't straighten the leg pain up to  10/10 Relieving factors: pain meds, ice, rest, elevation at best pain a 7/10  PRECAUTIONS: Other: back surgery in the past  RED FLAGS: None   WEIGHT BEARING RESTRICTIONS: No  FALLS:  Has patient fallen in last 6 months? No  LIVING ENVIRONMENT: Lives with: lives with their family Lives in: House/apartment Stairs: Yes: Internal: 20 steps; can reach both Has following equipment at home: Single point cane, Walker - 2 wheeled, shower chair, and Grab bars  OCCUPATION: volunteers  PLOF: Independent and no device was doing yoga  PATIENT GOALS: walk without device, have no pain, normal ROM, go up and down stairs step over step  NEXT MD VISIT: November 3  OBJECTIVE:  Note: Objective measures were completed at Evaluation unless otherwise noted.  DIAGNOSTIC FINDINGS: none  COGNITION: Overall cognitive status: Within functional limits for tasks assessed     SENSATION: WFL  EDEMA:  Circumferential: right mid patella 44cm, left 49 cm  MUSCLE LENGTH: Not tested  POSTURE: rounded shoulders, forward head, and knee guarded   PALPATION: Very sore and tender, some bruising in the thigh, very red in the medial knee with heat  LOWER EXTREMITY ROM:  Active ROM LEFT AROM 11/13/23 eval Left PROM 11/13/23 eval AROM LEFT 11/24/23 AROM LEft  12/08/23  Hip flexion      Hip extension      Hip abduction      Hip adduction      Hip internal rotation      Hip external rotation      Knee flexion 80 85 95 102 active Passive to 110  Knee extension 25 20 20 15   Ankle dorsiflexion      Ankle plantarflexion      Ankle inversion      Ankle eversion       (Blank rows = not tested)  LOWER EXTREMITY MMT:  MMT Right eval Left eval  Hip flexion    Hip extension    Hip abduction    Hip adduction    Hip internal rotation    Hip external rotation    Knee flexion    Knee extension    Ankle dorsiflexion    Ankle plantarflexion    Ankle inversion    Ankle eversion     (Blank  rows = not tested)  FUNCTIONAL TESTS:  5 times sit to stand: 35 seconds Timed up and go (TUG): 56 seconds with FWW  GAIT: Distance walked: 60 feet Assistive device utilized: Walker - 2 wheeled Level of assistance: CGA Comments: step to pattern, shortened stance phase on the left  TREATMENT DATE:  12/11/23 Bike position #8 x 6 minutes Stairs step over step up and down More square 3 blocks step overs CGA Outside gait curbs and uneven terrain Leg press 40# x 10. Then 20# working on flexion 25# HS curls Passive stretch flexion and extension Scar and patellar mobs Vaso medium pressure 34 degrees   12/08/23 Bike full revs position #8 x 6 minutes Stairs step over step Tmill pushes 25# HS curls 5# Leg extension LEg press 20# both legs working on flexion Leg press no weight with the left only Passive stretch flexion and extension Feet on ball K2C, rotation, bridge, isometric abs Vaso medium pressure 34 degrees in elevation  12/04/23 Nustep level 5 x 5 minutes Bike level 3 x 5 minutes seat position 8 with some trunk lean Walk to other building with SPC then stairs step over step a lot of verbal and tactile cues Leg press 20#  Gait with cues for bending at toe off Passive stretch extension and flexion Vaso 34 degrees in elevation  12/03/23 Nustep level 5 x 6 minutes Bike level 2 x 4 minutes Leg curls 20# 2x15 Leg Ext 5lb 2x10 LLE PROM w end range holds   Patellar mobs  Sit to stands 2x10 4in step up x10  6in x 3 each Resisted gait 30lb 4 way x 3 each  Vaso medium pressure 34 degrees  12/01/23 Nustep level 5 x 6 minutes Bike level 2 x 6 minutes Leg curls 20# 2x10 5# ext 20# and no weight leg press working on flexion 8 toe clears 8 lunge stretch PAssive stretch flexion and extension STM to the scar and the thigh Demo of HEP for LLLD  stretch for extension Vaso medium pressure 34 degrees  11/27/23 Bike full revs 6 minutes Nustep level 5 x 5 minutes 4 stairs up and down step over step 20# leg curls 20# Leg press  then no weight for ROM 2.5# LAQ 12 toe clears and then lunge stretch Passive stretch flexion and extension STM to the scar Vaso medium pressure 34 degrees  11/26/23 Nustep level 5 x 5 minutes Bike partial revs to full revs 5 minutes Leg curls 20# 20# leg press and then no weight working on flexion, then left only no weight small ROM Gait outside with SPC and CGA curbs 4 step up and down step over step Passive stretch flexion and extension 2# LAQ 2x10 some cues for TKE Feet on ball K2C, bridge Vaso 34 degrees  11/24/23 Bike partial revs x 6 minutes 20# leg curls 4 step over steps 3# LAQ Gait outside with SPC down curb and back with CGA LEg press 20# just working on flexion Passive stretch Scar mobilizations ROM measured as noted above Feet on ball K2C, rotation, bridge, isometric abs Vaso medium pressure in elevation 34 degrees  11/20/23 6 toe clears Step lunge strech 20# HS curls, then left only 10# Nustep level 5 x 5 minutes Bike partial revs Gait with cues to bend knee and having her exaggerate the bend at toe off Feet on ball K2C, bridge SAQ Passive left knee stretch Vaso medium pressure 34 degrees  11/19/23 Nustep level 3 x 6 minutes Stairs step over step 4and 6 going up step over step Second step lunge stretch Bike partial revs x 4 minutes 20# HS curls Gait with SPC, cues verbal CGA Passive stretch left knee, gentle STM to the left thigh, scar mobs Vaso left knee 34 degrees   11/17/23 Nustep level 5 x 5  minutes STM to the quad and scar very gentle Gentle scar and patellar mobs 6 toe clears 6 lunge stretch 20# HS curls 2.5# LAQ Gait with walker, verbal and tactile cues to bend knee at toe off Some walking with HHA and walking with SPC again cues needed Feet  on ball K2C, bridge Vaso medium pressure 37 degrees in elevation  11/13/23 Evaluation SAQ Vaso Medium pressure 37 degrees left knee     PATIENT EDUCATION:  Education details: reviewed RICE, gait, HEP Person educated: Patient Education method: Explanation, Demonstration, Tactile cues, Verbal cues, and Handouts Education comprehension: verbalized understanding, returned demonstration, verbal cues required, and tactile cues required  HOME EXERCISE PROGRAM: QS, chair scoots for flexion, ankle pumps, SLR, LAQ  ASSESSMENT:  CLINICAL IMPRESSION: Patient is walking with a little stiffness, with cues she will do better needs verbal and tactile cues to get HS to bend the knee.  She really does tolerate the the passive stretch well and the motion is holding and getting better.   She did gain very good ROM over the past 2 weeks as noted above.  We did the walk outside without the cane  Patient is a 63 y.o. female who was seen today for physical therapy evaluation and treatment for s/p left TKA on 11/10/23.  She is in a lot pain, has significant tenderness, a lot of redness and warmth in the medial knee.  Reports that she cannot raise the foot up without help but with encouragement she could, was able to do a SAQ.  TUG time was 56 seconds.   Has a lot of edema.   OBJECTIVE IMPAIRMENTS: Abnormal gait, cardiopulmonary status limiting activity, decreased activity tolerance, decreased balance, decreased coordination, decreased endurance, decreased mobility, difficulty walking, decreased ROM, decreased strength, increased edema, increased muscle spasms, impaired flexibility, and pain.    REHAB POTENTIAL: Good  CLINICAL DECISION MAKING: Evolving/moderate complexity  EVALUATION COMPLEXITY: Moderate   GOALS: Goals reviewed with patient? Yes  SHORT TERM GOALS: Target date: 12/06/23 Independent with initial HEP Baseline: Goal status: met 11/19/23  LONG TERM GOALS: Target date:  04/12/24  Independent with advanced HEP Baseline:  Goal status: INITIAL  2.  Walk with LRAD all distances with minimal deviations Baseline:  Goal status:progressing 12/08/23  3.  Increase AROM of the left knee to 0-120 degrees flexion Baseline:  Goal status: progressing 11/27/23  4.  Decrease pain 50% Baseline:  Goal status: progressing 12/11/23  5.  Decrease TUG time to 18 seconds Baseline:  Goal status: progressing 12/08/23  6.  Go up and down stairs step over step Baseline:  Goal status: progressing 12/04/23   PLAN:  PT FREQUENCY: 3x/week to start and then will decrease as she improves  PT DURATION: 12 weeks  PLANNED INTERVENTIONS: 97164- PT Re-evaluation, 97110-Therapeutic exercises, 97530- Therapeutic activity, 97112- Neuromuscular re-education, 97535- Self Care, [id number]- Manual therapy, [phone]- Gait training, [phone]- Electrical stimulation (unattended), 97016- Vasopneumatic device, Patient/Family education, Balance training, Stair training, Joint mobilization, and Cryotherapy  PLAN FOR NEXT SESSION: work on motion, mm activation and walking   [place] Dalporto W, PT 12/11/2023, 1:16 PM

## 2023-12-15 ENCOUNTER — Encounter: Payer: Self-pay | Admitting: Physical Therapy

## 2023-12-15 ENCOUNTER — Ambulatory Visit: Admitting: Physical Therapy

## 2023-12-15 DIAGNOSIS — R600 Localized edema: Secondary | ICD-10-CM

## 2023-12-15 DIAGNOSIS — M25562 Pain in left knee: Secondary | ICD-10-CM | POA: Diagnosis not present

## 2023-12-15 DIAGNOSIS — R262 Difficulty in walking, not elsewhere classified: Secondary | ICD-10-CM

## 2023-12-15 DIAGNOSIS — M25662 Stiffness of left knee, not elsewhere classified: Secondary | ICD-10-CM

## 2023-12-15 NOTE — Therapy (Signed)
 OUTPATIENT PHYSICAL THERAPY LOWER EXTREMITY TREATMENT   Patient Name: [name] MRN: [id number] DOB:December 16, 1960, 63 y.o., female Today's Date: 12/15/2023  END OF SESSION:  PT End of Session - 12/15/23 1300     Visit Number 13    Date for Recertification  02/13/24    PT Start Time 1300    PT Stop Time 1345    PT Time Calculation (min) 45 min    Activity Tolerance Patient tolerated treatment well    Behavior During Therapy WFL for tasks assessed/performed          Past Medical History:  Diagnosis Date   Anxiety    Arthritis    Back pain of lumbar region with sciatica    Cancer (HCC)    breast   Depression    Family history of anesthesia complication    grandfather died under anesthesia 70's- had heart issues   H/O carpal tunnel repair 2014   Headache(784.0)    Hyperlipidemia    Hypertension    Hypothyroidism    Medial meniscus tear    PONV (postoperative nausea and vomiting)    Pre-diabetes    Sleep apnea    cpap since 10   Past Surgical History:  Procedure Laterality Date   BILATERAL TOTAL MASTECTOMY WITH AXILLARY LYMPH NODE DISSECTION     BREAST RECONSTRUCTION Left 07/17/2023   for encapsulation,  done at [facility]   CERVICAL DISC ARTHROPLASTY N/A 03/19/2013   Procedure: CERVICAL FIVE TO SIX, CERVICAL SIX TO SEVEN CERVICAL ANTERIOR DISC ARTHROPLASTY;  Surgeon: Victory Gens, MD;  Location: [facility];  Service: Neurosurgery;  Laterality: N/A;  C56 C67 artificial disc replacement   CESAREAN SECTION     COLONOSCOPY     DILATATION & CURETTAGE/HYSTEROSCOPY WITH TRUECLEAR N/A 11/25/2013   Procedure: DILATATION & CURETTAGE/HYSTEROSCOPY WITH TRUCLEAR;  Surgeon: Charlie CHRISTELLA Croak, MD;  Location: [facility];  Service: Gynecology;  Laterality: N/A;   ECTOPIC PREGNANCY SURGERY     GALLBLADDER SURGERY     MOUTH SURGERY     child- dog bite   SPINAL FUSION     2023   STERIOD INJECTION Left 09/09/2022   Procedure: LEFT KNEE STEROID INJECTION;  Surgeon: Melodi Lerner, MD;   Location: [facility];  Service: Orthopedics;  Laterality: Left;  left knee injection 0928   TONSILLECTOMY     TOTAL KNEE ARTHROPLASTY Right 09/09/2022   Procedure: TOTAL KNEE ARTHROPLASTY;  Surgeon: Melodi Lerner, MD;  Location: [facility];  Service: Orthopedics;  Laterality: Right;   TOTAL KNEE ARTHROPLASTY Left 11/10/2023   Procedure: ARTHROPLASTY, KNEE, TOTAL;  Surgeon: Melodi Lerner, MD;  Location: [facility];  Service: Orthopedics;  Laterality: Left;   TUBAL LIGATION     Patient Active Problem List   Diagnosis Date Noted   Primary osteoarthritis of left knee 11/10/2023   OA (osteoarthritis) of knee 09/09/2022   Exertional dyspnea 04/17/2022   Elevated coronary artery calcium  score 04/17/2022   Spondylolisthesis at L4-L5 level 12/27/2021   Ductal carcinoma in situ (DCIS) of left breast 01/19/2018   Generalized anxiety disorder 11/07/2017   Depression 11/07/2017   Insomnia 11/07/2017   Cervical spondylosis 03/19/2013    PCP: Onita, MD  REFERRING PROVIDER: Bethanie, PA  REFERRING DIAG: s/p left TKA  THERAPY DIAG:  Acute pain of left knee  Stiffness of left knee, not elsewhere classified  Localized edema  Difficulty in walking, not elsewhere classified  Rationale for Evaluation and Treatment: Rehabilitation  ONSET DATE: 11/10/23  SUBJECTIVE:   SUBJECTIVE STATEMENT: Good  been in the car a lot  Patient underwent a left TKA on 11/10/23, she reports that she is doing okay, reports that she had noodle leg, reports trying to do the exercises daily  PERTINENT HISTORY: 63 yo female s/p L TKA on 11/10/23. PMH: R TKA, back pain, L3-4, 4-5 fusion in 2023, HTN, DM, C-spine arthroplasty  PAIN:  Are you having pain? Yes: NPRS scale: 6/10 Pain location: left knee Pain description: ache, sharpe , tender Aggravating factors: moving, walking , bending, I can't straighten the leg pain up to 10/10 Relieving factors: pain meds, ice, rest, elevation at best pain a 7/10  PRECAUTIONS:  Other: back surgery in the past  RED FLAGS: None   WEIGHT BEARING RESTRICTIONS: No  FALLS:  Has patient fallen in last 6 months? No  LIVING ENVIRONMENT: Lives with: lives with their family Lives in: House/apartment Stairs: Yes: Internal: 20 steps; can reach both Has following equipment at home: Single point cane, Walker - 2 wheeled, shower chair, and Grab bars  OCCUPATION: volunteers  PLOF: Independent and no device was doing yoga  PATIENT GOALS: walk without device, have no pain, normal ROM, go up and down stairs step over step  NEXT MD VISIT: November 3  OBJECTIVE:  Note: Objective measures were completed at Evaluation unless otherwise noted.  DIAGNOSTIC FINDINGS: none  COGNITION: Overall cognitive status: Within functional limits for tasks assessed     SENSATION: WFL  EDEMA:  Circumferential: right mid patella 44cm, left 49 cm  MUSCLE LENGTH: Not tested  POSTURE: rounded shoulders, forward head, and knee guarded   PALPATION: Very sore and tender, some bruising in the thigh, very red in the medial knee with heat  LOWER EXTREMITY ROM:  Active ROM LEFT AROM 11/13/23 eval Left PROM 11/13/23 eval AROM LEFT 11/24/23 AROM LEft  12/08/23  Hip flexion      Hip extension      Hip abduction      Hip adduction      Hip internal rotation      Hip external rotation      Knee flexion 80 85 95 102 active Passive to 110  Knee extension 25 20 20 15   Ankle dorsiflexion      Ankle plantarflexion      Ankle inversion      Ankle eversion       (Blank rows = not tested)  LOWER EXTREMITY MMT:  MMT Right eval Left eval  Hip flexion    Hip extension    Hip abduction    Hip adduction    Hip internal rotation    Hip external rotation    Knee flexion    Knee extension    Ankle dorsiflexion    Ankle plantarflexion    Ankle inversion    Ankle eversion     (Blank rows = not tested)  FUNCTIONAL TESTS:  5 times sit to stand: 35 seconds Timed up and go  (TUG): 56 seconds with FWW  GAIT: Distance walked: 60 feet Assistive device utilized: Walker - 2 wheeled Level of assistance: CGA Comments: step to pattern, shortened stance phase on the left  TREATMENT DATE:  12/15/23 Bike position #8 x 7 minutes 6in forward and lateral step ups x10 each  Heel raised black bar 2x10 Leg press 40lb 2x15 20lb resisted side steps over WaTE x5  L knee PROM w/ end rnage holds  Patella mobs  Tibiofemoral jt mobs Vaso medium pressure 34 degrees  12/11/23 Bike position #8 x 6 minutes Stairs step over step up and down More square 3 blocks step overs CGA Outside gait curbs and uneven terrain Leg press 40# x 10. Then 20# working on flexion 25# HS curls Passive stretch flexion and extension Scar and patellar mobs Vaso medium pressure 34 degrees   12/08/23 Bike full revs position #8 x 6 minutes Stairs step over step Tmill pushes 25# HS curls 5# Leg extension LEg press 20# both legs working on flexion Leg press no weight with the left only Passive stretch flexion and extension Feet on ball K2C, rotation, bridge, isometric abs Vaso medium pressure 34 degrees in elevation  12/04/23 Nustep level 5 x 5 minutes Bike level 3 x 5 minutes seat position 8 with some trunk lean Walk to other building with SPC then stairs step over step a lot of verbal and tactile cues Leg press 20#  Gait with cues for bending at toe off Passive stretch extension and flexion Vaso 34 degrees in elevation  12/03/23 Nustep level 5 x 6 minutes Bike level 2 x 4 minutes Leg curls 20# 2x15 Leg Ext 5lb 2x10 LLE PROM w end range holds   Patellar mobs  Sit to stands 2x10 4in step up x10  6in x 3 each Resisted gait 30lb 4 way x 3 each  Vaso medium pressure 34 degrees  12/01/23 Nustep level 5 x 6 minutes Bike level 2 x 6 minutes Leg curls 20#  2x10 5# ext 20# and no weight leg press working on flexion 8 toe clears 8 lunge stretch PAssive stretch flexion and extension STM to the scar and the thigh Demo of HEP for LLLD stretch for extension Vaso medium pressure 34 degrees  11/27/23 Bike full revs 6 minutes Nustep level 5 x 5 minutes 4 stairs up and down step over step 20# leg curls 20# Leg press  then no weight for ROM 2.5# LAQ 12 toe clears and then lunge stretch Passive stretch flexion and extension STM to the scar Vaso medium pressure 34 degrees  11/26/23 Nustep level 5 x 5 minutes Bike partial revs to full revs 5 minutes Leg curls 20# 20# leg press and then no weight working on flexion, then left only no weight small ROM Gait outside with SPC and CGA curbs 4 step up and down step over step Passive stretch flexion and extension 2# LAQ 2x10 some cues for TKE Feet on ball K2C, bridge Vaso 34 degrees  11/24/23 Bike partial revs x 6 minutes 20# leg curls 4 step over steps 3# LAQ Gait outside with SPC down curb and back with CGA LEg press 20# just working on flexion Passive stretch Scar mobilizations ROM measured as noted above Feet on ball K2C, rotation, bridge, isometric abs Vaso medium pressure in elevation 34 degrees  11/20/23 6 toe clears Step lunge strech 20# HS curls, then left only 10# Nustep level 5 x 5 minutes Bike partial revs Gait with cues to bend knee and having her exaggerate the bend at toe off Feet on ball K2C, bridge SAQ Passive left knee stretch Vaso medium pressure 34 degrees  11/19/23 Nustep level 3 x 6 minutes Stairs step  over step 4and 6 going up step over step Second step lunge stretch Bike partial revs x 4 minutes 20# HS curls Gait with SPC, cues verbal CGA Passive stretch left knee, gentle STM to the left thigh, scar mobs Vaso left knee 34 degrees   PATIENT EDUCATION:  Education details: reviewed RICE, gait, HEP Person educated: Patient Education method:  Explanation, Demonstration, Tactile cues, Verbal cues, and Handouts Education comprehension: verbalized understanding, returned demonstration, verbal cues required, and tactile cues required  HOME EXERCISE PROGRAM: QS, chair scoots for flexion, ankle pumps, SLR, LAQ  ASSESSMENT:  CLINICAL IMPRESSION: Patient enters holding SPC in RUE not using it with a stiff LLE.  Some end range pain with passive flexion today. Cue needed for pacing with step ups, CGA needed at times for instability. Pt also had some instability with resisted side steps.  Pt will benefit from continues skilled PT service to increase L knee strength and ROM to improve her functional capacity.  Patient is a 63 y.o. female who was seen today for physical therapy evaluation and treatment for s/p left TKA on 11/10/23.  She is in a lot pain, has significant tenderness, a lot of redness and warmth in the medial knee.  Reports that she cannot raise the foot up without help but with encouragement she could, was able to do a SAQ.  TUG time was 56 seconds.   Has a lot of edema.   OBJECTIVE IMPAIRMENTS: Abnormal gait, cardiopulmonary status limiting activity, decreased activity tolerance, decreased balance, decreased coordination, decreased endurance, decreased mobility, difficulty walking, decreased ROM, decreased strength, increased edema, increased muscle spasms, impaired flexibility, and pain.    REHAB POTENTIAL: Good  CLINICAL DECISION MAKING: Evolving/moderate complexity  EVALUATION COMPLEXITY: Moderate   GOALS: Goals reviewed with patient? Yes  SHORT TERM GOALS: Target date: 12/06/23 Independent with initial HEP Baseline: Goal status: met 11/19/23  LONG TERM GOALS: Target date: 04/12/24  Independent with advanced HEP Baseline:  Goal status: INITIAL  2.  Walk with LRAD all distances with minimal deviations Baseline:  Goal status:progressing 12/08/23  3.  Increase AROM of the left knee to 0-120 degrees  flexion Baseline:  Goal status: progressing 11/27/23  4.  Decrease pain 50% Baseline:  Goal status: progressing 12/11/23  5.  Decrease TUG time to 18 seconds Baseline:  Goal status: progressing 12/08/23  6.  Go up and down stairs step over step Baseline:  Goal status: progressing 12/04/23   PLAN:  PT FREQUENCY: 3x/week to start and then will decrease as she improves  PT DURATION: 12 weeks  PLANNED INTERVENTIONS: 97164- PT Re-evaluation, 97110-Therapeutic exercises, 97530- Therapeutic activity, 97112- Neuromuscular re-education, 97535- Self Care, [id number]- Manual therapy, [phone]- Gait training, [phone]- Electrical stimulation (unattended), 97016- Vasopneumatic device, Patient/Family education, Balance training, Stair training, Joint mobilization, and Cryotherapy  PLAN FOR NEXT SESSION: work on motion, mm activation and walking   Tanda KANDICE Sorrow, PTA 12/15/2023, 1:00 PM

## 2023-12-17 ENCOUNTER — Ambulatory Visit

## 2023-12-17 ENCOUNTER — Ambulatory Visit: Admitting: Professional Counselor

## 2023-12-17 DIAGNOSIS — M25562 Pain in left knee: Secondary | ICD-10-CM | POA: Diagnosis not present

## 2023-12-17 DIAGNOSIS — R262 Difficulty in walking, not elsewhere classified: Secondary | ICD-10-CM

## 2023-12-17 DIAGNOSIS — R600 Localized edema: Secondary | ICD-10-CM

## 2023-12-17 DIAGNOSIS — M25662 Stiffness of left knee, not elsewhere classified: Secondary | ICD-10-CM

## 2023-12-17 NOTE — Therapy (Signed)
 OUTPATIENT PHYSICAL THERAPY LOWER EXTREMITY TREATMENT   Patient Name: [name] MRN: [id number] DOB:[birth year], 63 y.o., female Today's Date: 12/17/2023  END OF SESSION:  PT End of Session - 12/17/23 1533     Visit Number 14    Date for Recertification  02/13/24    PT Start Time 1533    PT Stop Time 1625    PT Time Calculation (min) 52 min    Activity Tolerance Patient tolerated treatment well    Behavior During Therapy WFL for tasks assessed/performed           Past Medical History:  Diagnosis Date   Anxiety    Arthritis    Back pain of lumbar region with sciatica    Cancer (HCC)    breast   Depression    Family history of anesthesia complication    grandfather died under anesthesia 70's- had heart issues   H/O carpal tunnel repair 2014   Headache(784.0)    Hyperlipidemia    Hypertension    Hypothyroidism    Medial meniscus tear    PONV (postoperative nausea and vomiting)    Pre-diabetes    Sleep apnea    cpap since 10   Past Surgical History:  Procedure Laterality Date   BILATERAL TOTAL MASTECTOMY WITH AXILLARY LYMPH NODE DISSECTION     BREAST RECONSTRUCTION Left 07/17/2023   for encapsulation,  done at [facility]   CERVICAL DISC ARTHROPLASTY N/A 03/19/2013   Procedure: CERVICAL FIVE TO SIX, CERVICAL SIX TO SEVEN CERVICAL ANTERIOR DISC ARTHROPLASTY;  Surgeon: Victory Gens, MD;  Location: [facility];  Service: Neurosurgery;  Laterality: N/A;  C56 C67 artificial disc replacement   CESAREAN SECTION     COLONOSCOPY     DILATATION & CURETTAGE/HYSTEROSCOPY WITH TRUECLEAR N/A 11/25/2013   Procedure: DILATATION & CURETTAGE/HYSTEROSCOPY WITH TRUCLEAR;  Surgeon: Charlie CHRISTELLA Croak, MD;  Location: [facility];  Service: Gynecology;  Laterality: N/A;   ECTOPIC PREGNANCY SURGERY     GALLBLADDER SURGERY     MOUTH SURGERY     child- dog bite   SPINAL FUSION     2023   STERIOD INJECTION Left 09/09/2022   Procedure: LEFT KNEE STEROID INJECTION;  Surgeon: Melodi Lerner,  MD;  Location: [facility];  Service: Orthopedics;  Laterality: Left;  left knee injection 0928   TONSILLECTOMY     TOTAL KNEE ARTHROPLASTY Right 09/09/2022   Procedure: TOTAL KNEE ARTHROPLASTY;  Surgeon: Melodi Lerner, MD;  Location: [facility];  Service: Orthopedics;  Laterality: Right;   TOTAL KNEE ARTHROPLASTY Left 11/10/2023   Procedure: ARTHROPLASTY, KNEE, TOTAL;  Surgeon: Melodi Lerner, MD;  Location: [facility];  Service: Orthopedics;  Laterality: Left;   TUBAL LIGATION     Patient Active Problem List   Diagnosis Date Noted   Primary osteoarthritis of left knee 11/10/2023   OA (osteoarthritis) of knee 09/09/2022   Exertional dyspnea 04/17/2022   Elevated coronary artery calcium  score 04/17/2022   Spondylolisthesis at L4-L5 level 12/27/2021   Ductal carcinoma in situ (DCIS) of left breast 01/19/2018   Generalized anxiety disorder 11/07/2017   Depression 11/07/2017   Insomnia 11/07/2017   Cervical spondylosis 03/19/2013    PCP: Onita, MD  REFERRING PROVIDER: Bethanie, PA  REFERRING DIAG: s/p left TKA  THERAPY DIAG:  Acute pain of left knee  Stiffness of left knee, not elsewhere classified  Localized edema  Difficulty in walking, not elsewhere classified  Rationale for Evaluation and Treatment: Rehabilitation  ONSET DATE: 11/10/23  SUBJECTIVE:   SUBJECTIVE STATEMENT:  The knee feels good. Just feels tight because I have been standing.   Patient underwent a left TKA on 11/10/23, she reports that she is doing okay, reports that she had noodle leg, reports trying to do the exercises daily  PERTINENT HISTORY: 63 yo female s/p L TKA on 11/10/23. PMH: R TKA, back pain, L3-4, 4-5 fusion in 2023, HTN, DM, C-spine arthroplasty  PAIN:  Are you having pain? Yes: NPRS scale: 6/10 Pain location: left knee Pain description: ache, sharpe , tender Aggravating factors: moving, walking , bending, I can't straighten the leg pain up to 10/10 Relieving factors: pain meds, ice, rest,  elevation at best pain a 7/10  PRECAUTIONS: Other: back surgery in the past  RED FLAGS: None   WEIGHT BEARING RESTRICTIONS: No  FALLS:  Has patient fallen in last 6 months? No  LIVING ENVIRONMENT: Lives with: lives with their family Lives in: House/apartment Stairs: Yes: Internal: 20 steps; can reach both Has following equipment at home: Single point cane, Walker - 2 wheeled, shower chair, and Grab bars  OCCUPATION: volunteers  PLOF: Independent and no device was doing yoga  PATIENT GOALS: walk without device, have no pain, normal ROM, go up and down stairs step over step  NEXT MD VISIT: November 3  OBJECTIVE:  Note: Objective measures were completed at Evaluation unless otherwise noted.  DIAGNOSTIC FINDINGS: none  COGNITION: Overall cognitive status: Within functional limits for tasks assessed     SENSATION: WFL  EDEMA:  Circumferential: right mid patella 44cm, left 49 cm  MUSCLE LENGTH: Not tested  POSTURE: rounded shoulders, forward head, and knee guarded   PALPATION: Very sore and tender, some bruising in the thigh, very red in the medial knee with heat  LOWER EXTREMITY ROM:  Active ROM LEFT AROM 11/13/23 eval Left PROM 11/13/23 eval AROM LEFT 11/24/23 AROM LEft  12/08/23  Hip flexion      Hip extension      Hip abduction      Hip adduction      Hip internal rotation      Hip external rotation      Knee flexion 80 85 95 102 active Passive to 110  Knee extension 25 20 20 15   Ankle dorsiflexion      Ankle plantarflexion      Ankle inversion      Ankle eversion       (Blank rows = not tested)  LOWER EXTREMITY MMT:  MMT Right eval Left eval  Hip flexion    Hip extension    Hip abduction    Hip adduction    Hip internal rotation    Hip external rotation    Knee flexion    Knee extension    Ankle dorsiflexion    Ankle plantarflexion    Ankle inversion    Ankle eversion     (Blank rows = not tested)  FUNCTIONAL TESTS:  5  times sit to stand: 35 seconds Timed up and go (TUG): 56 seconds with FWW  GAIT: Distance walked: 60 feet Assistive device utilized: Walker - 2 wheeled Level of assistance: CGA Comments: step to pattern, shortened stance phase on the left  TREATMENT DATE:  12/17/23 Walk outdoors 1 big lap  Bike seat  L2x L knee PROM w/ end range holds  Patella mobs  Tibiofemoral jt mobs Leg ext 5# 2x10 HS curls 25# 2x10 Step ups 6 Vaso medium pressure 34d    12/15/23 Bike position #8 x 7 minutes 6in forward and lateral step ups x10 each  Heel raised black bar 2x10 Leg press 40lb 2x15 20lb resisted side steps over WaTE x5  L knee PROM w/ end rnage holds  Patella mobs  Tibiofemoral jt mobs Vaso medium pressure 34 degrees  12/11/23 Bike position #8 x 6 minutes Stairs step over step up and down More square 3 blocks step overs CGA Outside gait curbs and uneven terrain Leg press 40# x 10. Then 20# working on flexion 25# HS curls Passive stretch flexion and extension Scar and patellar mobs Vaso medium pressure 34 degrees   12/08/23 Bike full revs position #8 x 6 minutes Stairs step over step Tmill pushes 25# HS curls 5# Leg extension LEg press 20# both legs working on flexion Leg press no weight with the left only Passive stretch flexion and extension Feet on ball K2C, rotation, bridge, isometric abs Vaso medium pressure 34 degrees in elevation  12/04/23 Nustep level 5 x 5 minutes Bike level 3 x 5 minutes seat position 8 with some trunk lean Walk to other building with SPC then stairs step over step a lot of verbal and tactile cues Leg press 20#  Gait with cues for bending at toe off Passive stretch extension and flexion Vaso 34 degrees in elevation  12/03/23 Nustep level 5 x 6 minutes Bike level 2 x 4 minutes Leg curls 20# 2x15 Leg Ext  5lb 2x10 LLE PROM w end range holds   Patellar mobs  Sit to stands 2x10 4in step up x10  6in x 3 each Resisted gait 30lb 4 way x 3 each  Vaso medium pressure 34 degrees  12/01/23 Nustep level 5 x 6 minutes Bike level 2 x 6 minutes Leg curls 20# 2x10 5# ext 20# and no weight leg press working on flexion 8 toe clears 8 lunge stretch PAssive stretch flexion and extension STM to the scar and the thigh Demo of HEP for LLLD stretch for extension Vaso medium pressure 34 degrees  11/27/23 Bike full revs 6 minutes Nustep level 5 x 5 minutes 4 stairs up and down step over step 20# leg curls 20# Leg press  then no weight for ROM 2.5# LAQ 12 toe clears and then lunge stretch Passive stretch flexion and extension STM to the scar Vaso medium pressure 34 degrees  11/26/23 Nustep level 5 x 5 minutes Bike partial revs to full revs 5 minutes Leg curls 20# 20# leg press and then no weight working on flexion, then left only no weight small ROM Gait outside with SPC and CGA curbs 4 step up and down step over step Passive stretch flexion and extension 2# LAQ 2x10 some cues for TKE Feet on ball K2C, bridge Vaso 34 degrees  11/24/23 Bike partial revs x 6 minutes 20# leg curls 4 step over steps 3# LAQ Gait outside with SPC down curb and back with CGA LEg press 20# just working on flexion Passive stretch Scar mobilizations ROM measured as noted above Feet on ball K2C, rotation, bridge, isometric abs Vaso medium pressure in elevation 34 degrees  11/20/23 6 toe clears Step lunge strech 20# HS curls, then left only 10# Nustep level 5 x 5  minutes Bike partial revs Gait with cues to bend knee and having her exaggerate the bend at toe off Feet on ball K2C, bridge SAQ Passive left knee stretch Vaso medium pressure 34 degrees  11/19/23 Nustep level 3 x 6 minutes Stairs step over step 4and 6 going up step over step Second step lunge stretch Bike partial revs x 4  minutes 20# HS curls Gait with SPC, cues verbal CGA Passive stretch left knee, gentle STM to the left thigh, scar mobs Vaso left knee 34 degrees   PATIENT EDUCATION:  Education details: reviewed RICE, gait, HEP Person educated: Patient Education method: Explanation, Demonstration, Tactile cues, Verbal cues, and Handouts Education comprehension: verbalized understanding, returned demonstration, verbal cues required, and tactile cues required  HOME EXERCISE PROGRAM: QS, chair scoots for flexion, ankle pumps, SLR, LAQ  ASSESSMENT:  CLINICAL IMPRESSION: Patient enters without SPC, reports some stiffness in LLE from prolonged standing doing Thanksgiving prep. Continues to show good progress with ROM. Does well with outdoor ambulation, able to keep a good pace and no pain. Was advised that she can start putting vitamin E oil or shea butter on the scar for dryness and do some scar mobilizations. Less compensation noted with step ups today. Pt will benefit from continues skilled PT service to increase L knee strength and ROM to improve her functional capacity.  Patient is a 63 y.o. female who was seen today for physical therapy evaluation and treatment for s/p left TKA on 11/10/23.  She is in a lot pain, has significant tenderness, a lot of redness and warmth in the medial knee.  Reports that she cannot raise the foot up without help but with encouragement she could, was able to do a SAQ.  TUG time was 56 seconds.   Has a lot of edema.   OBJECTIVE IMPAIRMENTS: Abnormal gait, cardiopulmonary status limiting activity, decreased activity tolerance, decreased balance, decreased coordination, decreased endurance, decreased mobility, difficulty walking, decreased ROM, decreased strength, increased edema, increased muscle spasms, impaired flexibility, and pain.    REHAB POTENTIAL: Good  CLINICAL DECISION MAKING: Evolving/moderate complexity  EVALUATION COMPLEXITY: Moderate   GOALS: Goals reviewed  with patient? Yes  SHORT TERM GOALS: Target date: 12/06/23 Independent with initial HEP Baseline: Goal status: met 11/19/23  LONG TERM GOALS: Target date: 04/12/24  Independent with advanced HEP Baseline:  Goal status: INITIAL  2.  Walk with LRAD all distances with minimal deviations Baseline:  Goal status:progressing 12/08/23  3.  Increase AROM of the left knee to 0-120 degrees flexion Baseline:  Goal status: progressing 11/27/23  4.  Decrease pain 50% Baseline:  Goal status: progressing 12/11/23  5.  Decrease TUG time to 18 seconds Baseline:  Goal status: progressing 12/08/23  6.  Go up and down stairs step over step Baseline:  Goal status: progressing 12/04/23   PLAN:  PT FREQUENCY: 3x/week to start and then will decrease as she improves  PT DURATION: 12 weeks  PLANNED INTERVENTIONS: 97164- PT Re-evaluation, 97110-Therapeutic exercises, 97530- Therapeutic activity, 97112- Neuromuscular re-education, 97535- Self Care, [id number]- Manual therapy, [phone]- Gait training, [phone]- Electrical stimulation (unattended), [id number]- Vasopneumatic device, Patient/Family education, Balance training, Stair training, Joint mobilization, and Cryotherapy  PLAN FOR NEXT SESSION: work on motion, mm activation and walking   [place], PT 12/17/2023, 4:13 PM

## 2023-12-22 ENCOUNTER — Encounter: Payer: Self-pay | Admitting: Physical Therapy

## 2023-12-22 ENCOUNTER — Ambulatory Visit: Attending: Neurological Surgery | Admitting: Physical Therapy

## 2023-12-22 DIAGNOSIS — R600 Localized edema: Secondary | ICD-10-CM | POA: Insufficient documentation

## 2023-12-22 DIAGNOSIS — M25662 Stiffness of left knee, not elsewhere classified: Secondary | ICD-10-CM | POA: Diagnosis present

## 2023-12-22 DIAGNOSIS — R262 Difficulty in walking, not elsewhere classified: Secondary | ICD-10-CM | POA: Diagnosis present

## 2023-12-22 DIAGNOSIS — M6281 Muscle weakness (generalized): Secondary | ICD-10-CM | POA: Insufficient documentation

## 2023-12-22 DIAGNOSIS — M25562 Pain in left knee: Secondary | ICD-10-CM | POA: Diagnosis present

## 2023-12-22 DIAGNOSIS — M549 Dorsalgia, unspecified: Secondary | ICD-10-CM | POA: Diagnosis present

## 2023-12-22 DIAGNOSIS — Z96651 Presence of right artificial knee joint: Secondary | ICD-10-CM | POA: Insufficient documentation

## 2023-12-22 DIAGNOSIS — G8929 Other chronic pain: Secondary | ICD-10-CM | POA: Diagnosis present

## 2023-12-22 NOTE — Therapy (Signed)
 OUTPATIENT PHYSICAL THERAPY LOWER EXTREMITY TREATMENT   Patient Name: [name] MRN: [id number] DOB:03/25/1960, 63 y.o., female Today's Date: 12/22/2023  END OF SESSION:  PT End of Session - 12/22/23 1447     Visit Number 15    Date for Recertification  02/13/24    Authorization Type [employer]    PT Start Time 1445    PT Stop Time 1545    PT Time Calculation (min) 60 min    Activity Tolerance Patient tolerated treatment well    Behavior During Therapy WFL for tasks assessed/performed           Past Medical History:  Diagnosis Date   Anxiety    Arthritis    Back pain of lumbar region with sciatica    Cancer (HCC)    breast   Depression    Family history of anesthesia complication    grandfather died under anesthesia 70's- had heart issues   H/O carpal tunnel repair 2014   Headache(784.0)    Hyperlipidemia    Hypertension    Hypothyroidism    Medial meniscus tear    PONV (postoperative nausea and vomiting)    Pre-diabetes    Sleep apnea    cpap since 10   Past Surgical History:  Procedure Laterality Date   BILATERAL TOTAL MASTECTOMY WITH AXILLARY LYMPH NODE DISSECTION     BREAST RECONSTRUCTION Left 07/17/2023   for encapsulation,  done at [facility]   CERVICAL DISC ARTHROPLASTY N/A 03/19/2013   Procedure: CERVICAL FIVE TO SIX, CERVICAL SIX TO SEVEN CERVICAL ANTERIOR DISC ARTHROPLASTY;  Surgeon: Victory Gens, MD;  Location: [facility];  Service: Neurosurgery;  Laterality: N/A;  C56 C67 artificial disc replacement   CESAREAN SECTION     COLONOSCOPY     DILATATION & CURETTAGE/HYSTEROSCOPY WITH TRUECLEAR N/A 11/25/2013   Procedure: DILATATION & CURETTAGE/HYSTEROSCOPY WITH TRUCLEAR;  Surgeon: Charlie CHRISTELLA Croak, MD;  Location: [facility];  Service: Gynecology;  Laterality: N/A;   ECTOPIC PREGNANCY SURGERY     GALLBLADDER SURGERY     MOUTH SURGERY     child- dog bite   SPINAL FUSION     2023   STERIOD INJECTION Left 09/09/2022   Procedure: LEFT KNEE STEROID INJECTION;   Surgeon: Melodi Lerner, MD;  Location: [facility];  Service: Orthopedics;  Laterality: Left;  left knee injection 0928   TONSILLECTOMY     TOTAL KNEE ARTHROPLASTY Right 09/09/2022   Procedure: TOTAL KNEE ARTHROPLASTY;  Surgeon: Melodi Lerner, MD;  Location: [facility];  Service: Orthopedics;  Laterality: Right;   TOTAL KNEE ARTHROPLASTY Left 11/10/2023   Procedure: ARTHROPLASTY, KNEE, TOTAL;  Surgeon: Melodi Lerner, MD;  Location: [facility];  Service: Orthopedics;  Laterality: Left;   TUBAL LIGATION     Patient Active Problem List   Diagnosis Date Noted   Primary osteoarthritis of left knee 11/10/2023   OA (osteoarthritis) of knee 09/09/2022   Exertional dyspnea 04/17/2022   Elevated coronary artery calcium  score 04/17/2022   Spondylolisthesis at L4-L5 level 12/27/2021   Ductal carcinoma in situ (DCIS) of left breast 01/19/2018   Generalized anxiety disorder 11/07/2017   Depression 11/07/2017   Insomnia 11/07/2017   Cervical spondylosis 03/19/2013    PCP: Onita, MD  REFERRING PROVIDER: Bethanie, PA  REFERRING DIAG: s/p left TKA  THERAPY DIAG:  Acute pain of left knee  Stiffness of left knee, not elsewhere classified  Localized edema  Difficulty in walking, not elsewhere classified  Chronic bilateral back pain, unspecified back location  Rationale for  Evaluation and Treatment: Rehabilitation  ONSET DATE: 11/10/23  SUBJECTIVE:   SUBJECTIVE STATEMENT: Doing okay, feel like I am a little stiff, doing stairs better  Patient underwent a left TKA on 11/10/23, she reports that she is doing okay, reports that she had noodle leg, reports trying to do the exercises daily  PERTINENT HISTORY: 63 yo female s/p L TKA on 11/10/23. PMH: R TKA, back pain, L3-4, 4-5 fusion in 2023, HTN, DM, C-spine arthroplasty  PAIN:  Are you having pain? Yes: NPRS scale: 5/10 Pain location: left knee Pain description: ache, sharpe , tender Aggravating factors: moving, walking , bending, I can't  straighten the leg pain up to 10/10 Relieving factors: pain meds, ice, rest, elevation at best pain a 7/10  PRECAUTIONS: Other: back surgery in the past  RED FLAGS: None   WEIGHT BEARING RESTRICTIONS: No  FALLS:  Has patient fallen in last 6 months? No  LIVING ENVIRONMENT: Lives with: lives with their family Lives in: House/apartment Stairs: Yes: Internal: 20 steps; can reach both Has following equipment at home: Single point cane, Walker - 2 wheeled, shower chair, and Grab bars  OCCUPATION: volunteers  PLOF: Independent and no device was doing yoga  PATIENT GOALS: walk without device, have no pain, normal ROM, go up and down stairs step over step  NEXT MD VISIT: November 3  OBJECTIVE:  Note: Objective measures were completed at Evaluation unless otherwise noted.  DIAGNOSTIC FINDINGS: none  COGNITION: Overall cognitive status: Within functional limits for tasks assessed     SENSATION: WFL  EDEMA:  Circumferential: right mid patella 44cm, left 49 cm  MUSCLE LENGTH: Not tested  POSTURE: rounded shoulders, forward head, and knee guarded   PALPATION: Very sore and tender, some bruising in the thigh, very red in the medial knee with heat  LOWER EXTREMITY ROM:  Active ROM LEFT AROM 11/13/23 eval Left PROM 11/13/23 eval AROM LEFT 11/24/23 AROM LEft  12/08/23 AROM LEft 12/22/23  Hip flexion       Hip extension       Hip abduction       Hip adduction       Hip internal rotation       Hip external rotation       Knee flexion 80 85 95 102 active Passive to 110 111  Knee extension 25 20 20 15 7   Ankle dorsiflexion       Ankle plantarflexion       Ankle inversion       Ankle eversion        (Blank rows = not tested)  LOWER EXTREMITY MMT:  MMT Right eval Left eval  Hip flexion    Hip extension    Hip abduction    Hip adduction    Hip internal rotation    Hip external rotation    Knee flexion    Knee extension    Ankle dorsiflexion    Ankle  plantarflexion    Ankle inversion    Ankle eversion     (Blank rows = not tested)  FUNCTIONAL TESTS:  5 times sit to stand: 35 seconds Timed up and go (TUG): 56 seconds with FWW  GAIT: Distance walked: 60 feet Assistive device utilized: Walker - 2 wheeled Level of assistance: CGA Comments: step to pattern, shortened stance phase on the left  TREATMENT DATE:  12/22/23 Bike level 4 x 6 minutes seat #7 25# HS curls On airex cone toe touches On airex ball toss On airex eyes closed , head turns and nods Slant board stretch Leg press 20# 2x10 LAQ 5# PROM extension and flexion, scar and joint mobs Vaso in elevation 34 degrees medium pressure  12/17/23 Walk outdoors 1 big lap  Bike seat  L2x L knee PROM w/ end range holds  Patella mobs  Tibiofemoral jt mobs Leg ext 5# 2x10 HS curls 25# 2x10 Step ups 6 Vaso medium pressure 34d    12/15/23 Bike position #8 x 7 minutes 6in forward and lateral step ups x10 each  Heel raised black bar 2x10 Leg press 40lb 2x15 20lb resisted side steps over WaTE x5  L knee PROM w/ end rnage holds  Patella mobs  Tibiofemoral jt mobs Vaso medium pressure 34 degrees  12/11/23 Bike position #8 x 6 minutes Stairs step over step up and down More square 3 blocks step overs CGA Outside gait curbs and uneven terrain Leg press 40# x 10. Then 20# working on flexion 25# HS curls Passive stretch flexion and extension Scar and patellar mobs Vaso medium pressure 34 degrees   12/08/23 Bike full revs position #8 x 6 minutes Stairs step over step Tmill pushes 25# HS curls 5# Leg extension LEg press 20# both legs working on flexion Leg press no weight with the left only Passive stretch flexion and extension Feet on ball K2C, rotation, bridge, isometric abs Vaso medium pressure 34 degrees in  elevation  12/04/23 Nustep level 5 x 5 minutes Bike level 3 x 5 minutes seat position 8 with some trunk lean Walk to other building with SPC then stairs step over step a lot of verbal and tactile cues Leg press 20#  Gait with cues for bending at toe off Passive stretch extension and flexion Vaso 34 degrees in elevation  12/03/23 Nustep level 5 x 6 minutes Bike level 2 x 4 minutes Leg curls 20# 2x15 Leg Ext 5lb 2x10 LLE PROM w end range holds   Patellar mobs  Sit to stands 2x10 4in step up x10  6in x 3 each Resisted gait 30lb 4 way x 3 each  Vaso medium pressure 34 degrees  12/01/23 Nustep level 5 x 6 minutes Bike level 2 x 6 minutes Leg curls 20# 2x10 5# ext 20# and no weight leg press working on flexion 8 toe clears 8 lunge stretch PAssive stretch flexion and extension STM to the scar and the thigh Demo of HEP for LLLD stretch for extension Vaso medium pressure 34 degrees  11/27/23 Bike full revs 6 minutes Nustep level 5 x 5 minutes 4 stairs up and down step over step 20# leg curls 20# Leg press  then no weight for ROM 2.5# LAQ 12 toe clears and then lunge stretch Passive stretch flexion and extension STM to the scar Vaso medium pressure 34 degrees  11/26/23 Nustep level 5 x 5 minutes Bike partial revs to full revs 5 minutes Leg curls 20# 20# leg press and then no weight working on flexion, then left only no weight small ROM Gait outside with SPC and CGA curbs 4 step up and down step over step Passive stretch flexion and extension 2# LAQ 2x10 some cues for TKE Feet on ball K2C, bridge Vaso 34 degrees  11/24/23 Bike partial revs x 6 minutes 20# leg curls 4 step over steps 3# LAQ Gait outside with SPC down curb  and back with CGA LEg press 20# just working on flexion Passive stretch Scar mobilizations ROM measured as noted above Feet on ball K2C, rotation, bridge, isometric abs Vaso medium pressure in elevation 34 degrees  11/20/23 6 toe  clears Step lunge strech 20# HS curls, then left only 10# Nustep level 5 x 5 minutes Bike partial revs Gait with cues to bend knee and having her exaggerate the bend at toe off Feet on ball K2C, bridge SAQ Passive left knee stretch Vaso medium pressure 34 degrees  11/19/23 Nustep level 3 x 6 minutes Stairs step over step 4and 6 going up step over step Second step lunge stretch Bike partial revs x 4 minutes 20# HS curls Gait with SPC, cues verbal CGA Passive stretch left knee, gentle STM to the left thigh, scar mobs Vaso left knee 34 degrees   PATIENT EDUCATION:  Education details: reviewed RICE, gait, HEP Person educated: Patient Education method: Programmer, Multimedia, Demonstration, Tactile cues, Verbal cues, and Handouts Education comprehension: verbalized understanding, returned demonstration, verbal cues required, and tactile cues required  HOME EXERCISE PROGRAM: QS, chair scoots for flexion, ankle pumps, SLR, LAQ  ASSESSMENT:  CLINICAL IMPRESSION: Patient doing very well, has not had any pain meds today, a little sore, she is not using a device for walking.  Her ROM continues to improve.  AROM 7-111 degrees flexion today.  She does report fear of regressing especially with her feeling stiff after sitting,we did do a lot of work with her gait as she would walk stiff legged.   Pt will benefit from continues skilled PT service to increase L knee strength and ROM to improve her functional capacity.  Patient is a 63 y.o. female who was seen today for physical therapy evaluation and treatment for s/p left TKA on 11/10/23.  She is in a lot pain, has significant tenderness, a lot of redness and warmth in the medial knee.  Reports that she cannot raise the foot up without help but with encouragement she could, was able to do a SAQ.  TUG time was 56 seconds.   Has a lot of edema.   OBJECTIVE IMPAIRMENTS: Abnormal gait, cardiopulmonary status limiting activity, decreased activity tolerance,  decreased balance, decreased coordination, decreased endurance, decreased mobility, difficulty walking, decreased ROM, decreased strength, increased edema, increased muscle spasms, impaired flexibility, and pain.    REHAB POTENTIAL: Good  CLINICAL DECISION MAKING: Evolving/moderate complexity  EVALUATION COMPLEXITY: Moderate   GOALS: Goals reviewed with patient? Yes  SHORT TERM GOALS: Target date: 12/06/23 Independent with initial HEP Baseline: Goal status: met 11/19/23  LONG TERM GOALS: Target date: 04/12/24  Independent with advanced HEP Baseline:  Goal status: INITIAL  2.  Walk with LRAD all distances with minimal deviations Baseline:  Goal status: progressing 12/22/23  3.  Increase AROM of the left knee to 0-120 degrees flexion Baseline:  Goal status: progressing 12/22/23  4.  Decrease pain 50% Baseline:  Goal status: progressing 12/22/23  5.  Decrease TUG time to 18 seconds Baseline:  Goal status: met 12/22/23  6.  Go up and down stairs step over step Baseline:  Goal status: progressing 12/22/23, for the most part can do this but still has trouble at her home stairs   PLAN:  PT FREQUENCY: 3x/week to start and then will decrease as she improves  PT DURATION: 12 weeks  PLANNED INTERVENTIONS: [id number]- PT Re-evaluation, 97110-Therapeutic exercises, 97530- Therapeutic activity, 97112- Neuromuscular re-education, 97535- Self Care, [id number]- Manual therapy, [id number]- Gait training, [phone]- Electrical stimulation (unattended),  [id number]- Vasopneumatic device, Patient/Family education, Location manager, Stair training, Joint mobilization, and Cryotherapy  PLAN FOR NEXT SESSION: continuing to do very well with her walking and her ROM   Sanai Frick W, PT 12/22/2023, 2:48 PM

## 2023-12-24 ENCOUNTER — Ambulatory Visit: Admitting: Professional Counselor

## 2023-12-24 ENCOUNTER — Ambulatory Visit: Admitting: Physical Therapy

## 2023-12-24 ENCOUNTER — Encounter: Payer: Self-pay | Admitting: Physical Therapy

## 2023-12-24 DIAGNOSIS — G8929 Other chronic pain: Secondary | ICD-10-CM

## 2023-12-24 DIAGNOSIS — M25562 Pain in left knee: Secondary | ICD-10-CM | POA: Diagnosis not present

## 2023-12-24 DIAGNOSIS — R262 Difficulty in walking, not elsewhere classified: Secondary | ICD-10-CM

## 2023-12-24 DIAGNOSIS — R600 Localized edema: Secondary | ICD-10-CM

## 2023-12-24 DIAGNOSIS — M25662 Stiffness of left knee, not elsewhere classified: Secondary | ICD-10-CM

## 2023-12-24 NOTE — Therapy (Signed)
 OUTPATIENT PHYSICAL THERAPY LOWER EXTREMITY TREATMENT   Patient Name: [name] MRN: [id number] DOB:Apr 27, 1960, 63 y.o., female Today's Date: 12/24/2023  END OF SESSION:  PT End of Session - 12/24/23 1452     Visit Number 16    Date for Recertification  02/13/24    Authorization Type [employer]    PT Start Time 1445    PT Stop Time 1545    PT Time Calculation (min) 60 min    Activity Tolerance Patient tolerated treatment well    Behavior During Therapy WFL for tasks assessed/performed           Past Medical History:  Diagnosis Date   Anxiety    Arthritis    Back pain of lumbar region with sciatica    Cancer (HCC)    breast   Depression    Family history of anesthesia complication    grandfather died under anesthesia 70's- had heart issues   H/O carpal tunnel repair 2014   Headache(784.0)    Hyperlipidemia    Hypertension    Hypothyroidism    Medial meniscus tear    PONV (postoperative nausea and vomiting)    Pre-diabetes    Sleep apnea    cpap since 10   Past Surgical History:  Procedure Laterality Date   BILATERAL TOTAL MASTECTOMY WITH AXILLARY LYMPH NODE DISSECTION     BREAST RECONSTRUCTION Left 07/17/2023   for encapsulation,  done at [facility]   CERVICAL DISC ARTHROPLASTY N/A 03/19/2013   Procedure: CERVICAL FIVE TO SIX, CERVICAL SIX TO SEVEN CERVICAL ANTERIOR DISC ARTHROPLASTY;  Surgeon: Victory Gens, MD;  Location: [facility];  Service: Neurosurgery;  Laterality: N/A;  C56 C67 artificial disc replacement   CESAREAN SECTION     COLONOSCOPY     DILATATION & CURETTAGE/HYSTEROSCOPY WITH TRUECLEAR N/A 11/25/2013   Procedure: DILATATION & CURETTAGE/HYSTEROSCOPY WITH TRUCLEAR;  Surgeon: Charlie CHRISTELLA Croak, MD;  Location: [facility];  Service: Gynecology;  Laterality: N/A;   ECTOPIC PREGNANCY SURGERY     GALLBLADDER SURGERY     MOUTH SURGERY     child- dog bite   SPINAL FUSION     2023   STERIOD INJECTION Left 09/09/2022   Procedure: LEFT KNEE STEROID INJECTION;   Surgeon: Melodi Lerner, MD;  Location: [facility];  Service: Orthopedics;  Laterality: Left;  left knee injection 0928   TONSILLECTOMY     TOTAL KNEE ARTHROPLASTY Right 09/09/2022   Procedure: TOTAL KNEE ARTHROPLASTY;  Surgeon: Melodi Lerner, MD;  Location: [facility];  Service: Orthopedics;  Laterality: Right;   TOTAL KNEE ARTHROPLASTY Left 11/10/2023   Procedure: ARTHROPLASTY, KNEE, TOTAL;  Surgeon: Melodi Lerner, MD;  Location: [facility];  Service: Orthopedics;  Laterality: Left;   TUBAL LIGATION     Patient Active Problem List   Diagnosis Date Noted   Primary osteoarthritis of left knee 11/10/2023   OA (osteoarthritis) of knee 09/09/2022   Exertional dyspnea 04/17/2022   Elevated coronary artery calcium  score 04/17/2022   Spondylolisthesis at L4-L5 level 12/27/2021   Ductal carcinoma in situ (DCIS) of left breast 01/19/2018   Generalized anxiety disorder 11/07/2017   Depression 11/07/2017   Insomnia 11/07/2017   Cervical spondylosis 03/19/2013    PCP: Onita, MD  REFERRING PROVIDER: Bethanie, PA  REFERRING DIAG: s/p left TKA  THERAPY DIAG:  Acute pain of left knee  Stiffness of left knee, not elsewhere classified  Localized edema  Difficulty in walking, not elsewhere classified  Chronic bilateral back pain, unspecified back location  Rationale for  Evaluation and Treatment: Rehabilitation  ONSET DATE: 11/10/23  SUBJECTIVE:   SUBJECTIVE STATEMENT: Saw MD, very pleased  Patient underwent a left TKA on 11/10/23, she reports that she is doing okay, reports that she had noodle leg, reports trying to do the exercises daily  PERTINENT HISTORY: 63 yo female s/p L TKA on 11/10/23. PMH: R TKA, back pain, L3-4, 4-5 fusion in 2023, HTN, DM, C-spine arthroplasty  PAIN:  Are you having pain? Yes: NPRS scale: 5/10 Pain location: left knee Pain description: ache, sharpe , tender Aggravating factors: moving, walking , bending, I can't straighten the leg pain up to 10/10 Relieving  factors: pain meds, ice, rest, elevation at best pain a 7/10  PRECAUTIONS: Other: back surgery in the past  RED FLAGS: None   WEIGHT BEARING RESTRICTIONS: No  FALLS:  Has patient fallen in last 6 months? No  LIVING ENVIRONMENT: Lives with: lives with their family Lives in: House/apartment Stairs: Yes: Internal: 20 steps; can reach both Has following equipment at home: Single point cane, Walker - 2 wheeled, shower chair, and Grab bars  OCCUPATION: volunteers  PLOF: Independent and no device was doing yoga  PATIENT GOALS: walk without device, have no pain, normal ROM, go up and down stairs step over step  NEXT MD VISIT: November 3  OBJECTIVE:  Note: Objective measures were completed at Evaluation unless otherwise noted.  DIAGNOSTIC FINDINGS: none  COGNITION: Overall cognitive status: Within functional limits for tasks assessed     SENSATION: WFL  EDEMA:  Circumferential: right mid patella 44cm, left 49 cm  MUSCLE LENGTH: Not tested  POSTURE: rounded shoulders, forward head, and knee guarded   PALPATION: Very sore and tender, some bruising in the thigh, very red in the medial knee with heat  LOWER EXTREMITY ROM:  Active ROM LEFT AROM 11/13/23 eval Left PROM 11/13/23 eval AROM LEFT 11/24/23 AROM LEft  12/08/23 AROM LEft 12/22/23  Hip flexion       Hip extension       Hip abduction       Hip adduction       Hip internal rotation       Hip external rotation       Knee flexion 80 85 95 102 active Passive to 110 111  Knee extension 25 20 20 15 7   Ankle dorsiflexion       Ankle plantarflexion       Ankle inversion       Ankle eversion        (Blank rows = not tested)  LOWER EXTREMITY MMT:  MMT Right eval Left eval  Hip flexion    Hip extension    Hip abduction    Hip adduction    Hip internal rotation    Hip external rotation    Knee flexion    Knee extension    Ankle dorsiflexion    Ankle plantarflexion    Ankle inversion    Ankle  eversion     (Blank rows = not tested)  FUNCTIONAL TESTS:  5 times sit to stand: 35 seconds Timed up and go (TUG): 56 seconds with FWW  GAIT: Distance walked: 60 feet Assistive device utilized: Walker - 2 wheeled Level of assistance: CGA Comments: step to pattern, shortened stance phase on the left  TREATMENT DATE:  12/24/23 Bike level 4 seat # 5 x 5 minutes Gait outside Stairs step over step On bosu upside down reaching More square 3 blocks step over 8 step ups alternating feet 25# HS curls 5# extension Passive stretch Feet on ball bridge, isometric abs Vaso high pressure 34 degrees  12/22/23 Bike level 4 x 6 minutes seat #7 25# HS curls On airex cone toe touches On airex ball toss On airex eyes closed , head turns and nods Slant board stretch Leg press 20# 2x10 LAQ 5# PROM extension and flexion, scar and joint mobs Vaso in elevation 34 degrees medium pressure  12/17/23 Walk outdoors 1 big lap  Bike seat  L2x L knee PROM w/ end range holds  Patella mobs  Tibiofemoral jt mobs Leg ext 5# 2x10 HS curls 25# 2x10 Step ups 6 Vaso medium pressure 34d    12/15/23 Bike position #8 x 7 minutes 6in forward and lateral step ups x10 each  Heel raised black bar 2x10 Leg press 40lb 2x15 20lb resisted side steps over WaTE x5  L knee PROM w/ end rnage holds  Patella mobs  Tibiofemoral jt mobs Vaso medium pressure 34 degrees  12/11/23 Bike position #8 x 6 minutes Stairs step over step up and down More square 3 blocks step overs CGA Outside gait curbs and uneven terrain Leg press 40# x 10. Then 20# working on flexion 25# HS curls Passive stretch flexion and extension Scar and patellar mobs Vaso medium pressure 34 degrees   12/08/23 Bike full revs position #8 x 6 minutes Stairs step over step Tmill pushes 25# HS curls 5# Leg  extension LEg press 20# both legs working on flexion Leg press no weight with the left only Passive stretch flexion and extension Feet on ball K2C, rotation, bridge, isometric abs Vaso medium pressure 34 degrees in elevation  12/04/23 Nustep level 5 x 5 minutes Bike level 3 x 5 minutes seat position 8 with some trunk lean Walk to other building with SPC then stairs step over step a lot of verbal and tactile cues Leg press 20#  Gait with cues for bending at toe off Passive stretch extension and flexion Vaso 34 degrees in elevation  12/03/23 Nustep level 5 x 6 minutes Bike level 2 x 4 minutes Leg curls 20# 2x15 Leg Ext 5lb 2x10 LLE PROM w end range holds   Patellar mobs  Sit to stands 2x10 4in step up x10  6in x 3 each Resisted gait 30lb 4 way x 3 each  Vaso medium pressure 34 degrees  12/01/23 Nustep level 5 x 6 minutes Bike level 2 x 6 minutes Leg curls 20# 2x10 5# ext 20# and no weight leg press working on flexion 8 toe clears 8 lunge stretch PAssive stretch flexion and extension STM to the scar and the thigh Demo of HEP for LLLD stretch for extension Vaso medium pressure 34 degrees  11/27/23 Bike full revs 6 minutes Nustep level 5 x 5 minutes 4 stairs up and down step over step 20# leg curls 20# Leg press  then no weight for ROM 2.5# LAQ 12 toe clears and then lunge stretch Passive stretch flexion and extension STM to the scar Vaso medium pressure 34 degrees   PATIENT EDUCATION:  Education details: reviewed RICE, gait, HEP Person educated: Patient Education method: Explanation, Demonstration, Tactile cues, Verbal cues, and Handouts Education comprehension: verbalized understanding, returned demonstration, verbal cues required, and tactile cues required  HOME EXERCISE PROGRAM: QS, chair  scoots for flexion, ankle pumps, SLR, LAQ  ASSESSMENT:  CLINICAL IMPRESSION: Patient saw MD he was very pleased.  She is a little stiff today, but was able to do  the bike at position 5.  Going down stairs did cause pain.  Had a hard time getting her to activate the abs today.   Pt will benefit from continues skilled PT service to increase L knee strength and ROM to improve her functional capacity.  Patient is a 63 y.o. female who was seen today for physical therapy evaluation and treatment for s/p left TKA on 11/10/23.  She is in a lot pain, has significant tenderness, a lot of redness and warmth in the medial knee.  Reports that she cannot raise the foot up without help but with encouragement she could, was able to do a SAQ.  TUG time was 56 seconds.   Has a lot of edema.   OBJECTIVE IMPAIRMENTS: Abnormal gait, cardiopulmonary status limiting activity, decreased activity tolerance, decreased balance, decreased coordination, decreased endurance, decreased mobility, difficulty walking, decreased ROM, decreased strength, increased edema, increased muscle spasms, impaired flexibility, and pain.    REHAB POTENTIAL: Good  CLINICAL DECISION MAKING: Evolving/moderate complexity  EVALUATION COMPLEXITY: Moderate   GOALS: Goals reviewed with patient? Yes  SHORT TERM GOALS: Target date: 12/06/23 Independent with initial HEP Baseline: Goal status: met 11/19/23  LONG TERM GOALS: Target date: 04/12/24  Independent with advanced HEP Baseline:  Goal status: INITIAL  2.  Walk with LRAD all distances with minimal deviations Baseline:  Goal status: progressing 12/22/23  3.  Increase AROM of the left knee to 0-120 degrees flexion Baseline:  Goal status: progressing 12/22/23  4.  Decrease pain 50% Baseline:  Goal status: progressing 12/22/23  5.  Decrease TUG time to 18 seconds Baseline:  Goal status: met 12/22/23  6.  Go up and down stairs step over step Baseline:  Goal status: progressing 12/22/23, for the most part can do this but still has trouble at her home stairs   PLAN:  PT FREQUENCY: 3x/week to start and then will decrease as she  improves  PT DURATION: 12 weeks  PLANNED INTERVENTIONS: 97164- PT Re-evaluation, 97110-Therapeutic exercises, 97530- Therapeutic activity, 97112- Neuromuscular re-education, 97535- Self Care, 02859- Manual therapy, [phone]- Gait training, [phone]- Electrical stimulation (unattended), 97016- Vasopneumatic device, Patient/Family education, Balance training, Stair training, Joint mobilization, and Cryotherapy  PLAN FOR NEXT SESSION: continuing to do very well with her walking and her ROM   Iniya Matzek W, PT 12/24/2023, 2:52 PM

## 2023-12-25 ENCOUNTER — Encounter: Payer: Self-pay | Admitting: Physical Therapy

## 2023-12-25 ENCOUNTER — Ambulatory Visit: Admitting: Physical Therapy

## 2023-12-25 DIAGNOSIS — R600 Localized edema: Secondary | ICD-10-CM

## 2023-12-25 DIAGNOSIS — M25562 Pain in left knee: Secondary | ICD-10-CM | POA: Diagnosis not present

## 2023-12-25 DIAGNOSIS — R262 Difficulty in walking, not elsewhere classified: Secondary | ICD-10-CM

## 2023-12-25 DIAGNOSIS — M25662 Stiffness of left knee, not elsewhere classified: Secondary | ICD-10-CM

## 2023-12-25 DIAGNOSIS — G8929 Other chronic pain: Secondary | ICD-10-CM

## 2023-12-25 NOTE — Therapy (Signed)
 OUTPATIENT PHYSICAL THERAPY LOWER EXTREMITY TREATMENT   Patient Name: [name] MRN: [id number] DOB:09-03-1960, 63 y.o., female Today's Date: 12/25/2023  END OF SESSION:  PT End of Session - 12/25/23 1448     Visit Number 17    Date for Recertification  02/13/24    Authorization Type [employer]    PT Start Time 1445    PT Stop Time 1545    PT Time Calculation (min) 60 min    Activity Tolerance Patient tolerated treatment well    Behavior During Therapy WFL for tasks assessed/performed           Past Medical History:  Diagnosis Date   Anxiety    Arthritis    Back pain of lumbar region with sciatica    Cancer (HCC)    breast   Depression    Family history of anesthesia complication    grandfather died under anesthesia 70's- had heart issues   H/O carpal tunnel repair 2014   Headache(784.0)    Hyperlipidemia    Hypertension    Hypothyroidism    Medial meniscus tear    PONV (postoperative nausea and vomiting)    Pre-diabetes    Sleep apnea    cpap since 10   Past Surgical History:  Procedure Laterality Date   BILATERAL TOTAL MASTECTOMY WITH AXILLARY LYMPH NODE DISSECTION     BREAST RECONSTRUCTION Left 07/17/2023   for encapsulation,  done at [facility]   CERVICAL DISC ARTHROPLASTY N/A 03/19/2013   Procedure: CERVICAL FIVE TO SIX, CERVICAL SIX TO SEVEN CERVICAL ANTERIOR DISC ARTHROPLASTY;  Surgeon: Victory Gens, MD;  Location: [facility];  Service: Neurosurgery;  Laterality: N/A;  C56 C67 artificial disc replacement   CESAREAN SECTION     COLONOSCOPY     DILATATION & CURETTAGE/HYSTEROSCOPY WITH TRUECLEAR N/A 11/25/2013   Procedure: DILATATION & CURETTAGE/HYSTEROSCOPY WITH TRUCLEAR;  Surgeon: Charlie CHRISTELLA Croak, MD;  Location: [facility];  Service: Gynecology;  Laterality: N/A;   ECTOPIC PREGNANCY SURGERY     GALLBLADDER SURGERY     MOUTH SURGERY     child- dog bite   SPINAL FUSION     2023   STERIOD INJECTION Left 09/09/2022   Procedure: LEFT KNEE STEROID INJECTION;   Surgeon: Melodi Lerner, MD;  Location: [facility];  Service: Orthopedics;  Laterality: Left;  left knee injection 0928   TONSILLECTOMY     TOTAL KNEE ARTHROPLASTY Right 09/09/2022   Procedure: TOTAL KNEE ARTHROPLASTY;  Surgeon: Melodi Lerner, MD;  Location: [facility];  Service: Orthopedics;  Laterality: Right;   TOTAL KNEE ARTHROPLASTY Left 11/10/2023   Procedure: ARTHROPLASTY, KNEE, TOTAL;  Surgeon: Melodi Lerner, MD;  Location: [facility];  Service: Orthopedics;  Laterality: Left;   TUBAL LIGATION     Patient Active Problem List   Diagnosis Date Noted   Primary osteoarthritis of left knee 11/10/2023   OA (osteoarthritis) of knee 09/09/2022   Exertional dyspnea 04/17/2022   Elevated coronary artery calcium  score 04/17/2022   Spondylolisthesis at L4-L5 level 12/27/2021   Ductal carcinoma in situ (DCIS) of left breast 01/19/2018   Generalized anxiety disorder 11/07/2017   Depression 11/07/2017   Insomnia 11/07/2017   Cervical spondylosis 03/19/2013    PCP: Onita, MD  REFERRING PROVIDER: Bethanie, PA  REFERRING DIAG: s/p left TKA  THERAPY DIAG:  Acute pain of left knee  Stiffness of left knee, not elsewhere classified  Localized edema  Difficulty in walking, not elsewhere classified  Chronic bilateral back pain, unspecified back location  Rationale for  Evaluation and Treatment: Rehabilitation  ONSET DATE: 11/10/23  SUBJECTIVE:   SUBJECTIVE STATEMENT: Doing okay, no issues, just stiff  Patient underwent a left TKA on 11/10/23, she reports that she is doing okay, reports that she had noodle leg, reports trying to do the exercises daily  PERTINENT HISTORY: 63 yo female s/p L TKA on 11/10/23. PMH: R TKA, back pain, L3-4, 4-5 fusion in 2023, HTN, DM, C-spine arthroplasty  PAIN:  Are you having pain? Yes: NPRS scale: 5/10 Pain location: left knee Pain description: ache, sharpe , tender Aggravating factors: moving, walking , bending, I can't straighten the leg pain up to  10/10 Relieving factors: pain meds, ice, rest, elevation at best pain a 7/10  PRECAUTIONS: Other: back surgery in the past  RED FLAGS: None   WEIGHT BEARING RESTRICTIONS: No  FALLS:  Has patient fallen in last 6 months? No  LIVING ENVIRONMENT: Lives with: lives with their family Lives in: House/apartment Stairs: Yes: Internal: 20 steps; can reach both Has following equipment at home: Single point cane, Walker - 2 wheeled, shower chair, and Grab bars  OCCUPATION: volunteers  PLOF: Independent and no device was doing yoga  PATIENT GOALS: walk without device, have no pain, normal ROM, go up and down stairs step over step  NEXT MD VISIT: November 3  OBJECTIVE:  Note: Objective measures were completed at Evaluation unless otherwise noted.  DIAGNOSTIC FINDINGS: none  COGNITION: Overall cognitive status: Within functional limits for tasks assessed     SENSATION: WFL  EDEMA:  Circumferential: right mid patella 44cm, left 49 cm  MUSCLE LENGTH: Not tested  POSTURE: rounded shoulders, forward head, and knee guarded   PALPATION: Very sore and tender, some bruising in the thigh, very red in the medial knee with heat  LOWER EXTREMITY ROM:  Active ROM LEFT AROM 11/13/23 eval Left PROM 11/13/23 eval AROM LEFT 11/24/23 AROM LEft  12/08/23 AROM LEft 12/22/23  Hip flexion       Hip extension       Hip abduction       Hip adduction       Hip internal rotation       Hip external rotation       Knee flexion 80 85 95 102 active Passive to 110 111  Knee extension 25 20 20 15 7   Ankle dorsiflexion       Ankle plantarflexion       Ankle inversion       Ankle eversion        (Blank rows = not tested)  LOWER EXTREMITY MMT:  MMT Right eval Left eval  Hip flexion    Hip extension    Hip abduction    Hip adduction    Hip internal rotation    Hip external rotation    Knee flexion    Knee extension    Ankle dorsiflexion    Ankle plantarflexion    Ankle  inversion    Ankle eversion     (Blank rows = not tested)  FUNCTIONAL TESTS:  5 times sit to stand: 35 seconds Timed up and go (TUG): 56 seconds with FWW  GAIT: Distance walked: 60 feet Assistive device utilized: Walker - 2 wheeled Level of assistance: CGA Comments: step to pattern, shortened stance phase on the left  TREATMENT DATE:  12/25/23 Bike seat #5 , level 5 x 6 minutes More square 3 blocks step overs 25# HS curls 5# leg extension Bosu upside down  On airex toe touches on colored cones called out by PT Leg press 20# then working on flexion Passive stretch to the left knee Vaso medium pressure 34 degrees in elevation  12/24/23 Bike level 4 seat # 5 x 5 minutes Gait outside Stairs step over step On bosu upside down reaching More square 3 blocks step over 8 step ups alternating feet 25# HS curls 5# extension Passive stretch Feet on ball bridge, isometric abs Vaso high pressure 34 degrees  12/22/23 Bike level 4 x 6 minutes seat #7 25# HS curls On airex cone toe touches On airex ball toss On airex eyes closed , head turns and nods Slant board stretch Leg press 20# 2x10 LAQ 5# PROM extension and flexion, scar and joint mobs Vaso in elevation 34 degrees medium pressure  12/17/23 Walk outdoors 1 big lap  Bike seat  L2x L knee PROM w/ end range holds  Patella mobs  Tibiofemoral jt mobs Leg ext 5# 2x10 HS curls 25# 2x10 Step ups 6 Vaso medium pressure 34d    12/15/23 Bike position #8 x 7 minutes 6in forward and lateral step ups x10 each  Heel raised black bar 2x10 Leg press 40lb 2x15 20lb resisted side steps over WaTE x5  L knee PROM w/ end rnage holds  Patella mobs  Tibiofemoral jt mobs Vaso medium pressure 34 degrees  12/11/23 Bike position #8 x 6 minutes Stairs step over step up and down More square 3 blocks  step overs CGA Outside gait curbs and uneven terrain Leg press 40# x 10. Then 20# working on flexion 25# HS curls Passive stretch flexion and extension Scar and patellar mobs Vaso medium pressure 34 degrees   12/08/23 Bike full revs position #8 x 6 minutes Stairs step over step Tmill pushes 25# HS curls 5# Leg extension LEg press 20# both legs working on flexion Leg press no weight with the left only Passive stretch flexion and extension Feet on ball K2C, rotation, bridge, isometric abs Vaso medium pressure 34 degrees in elevation  12/04/23 Nustep level 5 x 5 minutes Bike level 3 x 5 minutes seat position 8 with some trunk lean Walk to other building with SPC then stairs step over step a lot of verbal and tactile cues Leg press 20#  Gait with cues for bending at toe off Passive stretch extension and flexion Vaso 34 degrees in elevation  12/03/23 Nustep level 5 x 6 minutes Bike level 2 x 4 minutes Leg curls 20# 2x15 Leg Ext 5lb 2x10 LLE PROM w end range holds   Patellar mobs  Sit to stands 2x10 4in step up x10  6in x 3 each Resisted gait 30lb 4 way x 3 each  Vaso medium pressure 34 degrees  12/01/23 Nustep level 5 x 6 minutes Bike level 2 x 6 minutes Leg curls 20# 2x10 5# ext 20# and no weight leg press working on flexion 8 toe clears 8 lunge stretch PAssive stretch flexion and extension STM to the scar and the thigh Demo of HEP for LLLD stretch for extension Vaso medium pressure 34 degrees  11/27/23 Bike full revs 6 minutes Nustep level 5 x 5 minutes 4 stairs up and down step over step 20# leg curls 20# Leg press  then no weight for ROM 2.5# LAQ 12 toe clears and then lunge  stretch Passive stretch flexion and extension STM to the scar Vaso medium pressure 34 degrees   PATIENT EDUCATION:  Education details: reviewed RICE, gait, HEP Person educated: Patient Education method: Explanation, Demonstration, Tactile cues, Verbal cues, and  Handouts Education comprehension: verbalized understanding, returned demonstration, verbal cues required, and tactile cues required  HOME EXERCISE PROGRAM: QS, chair scoots for flexion, ankle pumps, SLR, LAQ  ASSESSMENT:  CLINICAL IMPRESSION: Patient continues to be a little stiff however she gets moving and the ROM returns, she will be travelling next Friday and may have to go some long distances, may like to replicate that as she will have to go through the airport  Pt will benefit from continues skilled PT service to increase L knee strength and ROM to improve her functional capacity.  Patient is a 63 y.o. female who was seen today for physical therapy evaluation and treatment for s/p left TKA on 11/10/23.  She is in a lot pain, has significant tenderness, a lot of redness and warmth in the medial knee.  Reports that she cannot raise the foot up without help but with encouragement she could, was able to do a SAQ.  TUG time was 56 seconds.   Has a lot of edema.   OBJECTIVE IMPAIRMENTS: Abnormal gait, cardiopulmonary status limiting activity, decreased activity tolerance, decreased balance, decreased coordination, decreased endurance, decreased mobility, difficulty walking, decreased ROM, decreased strength, increased edema, increased muscle spasms, impaired flexibility, and pain.    REHAB POTENTIAL: Good  CLINICAL DECISION MAKING: Evolving/moderate complexity  EVALUATION COMPLEXITY: Moderate   GOALS: Goals reviewed with patient? Yes  SHORT TERM GOALS: Target date: 12/06/23 Independent with initial HEP Baseline: Goal status: met 11/19/23  LONG TERM GOALS: Target date: 04/12/24  Independent with advanced HEP Baseline:  Goal status: INITIAL  2.  Walk with LRAD all distances with minimal deviations Baseline:  Goal status: progressing 12/22/23  3.  Increase AROM of the left knee to 0-120 degrees flexion Baseline:  Goal status: progressing 12/22/23  4.  Decrease pain  50% Baseline:  Goal status: progressing 12/22/23  5.  Decrease TUG time to 18 seconds Baseline:  Goal status: met 12/22/23  6.  Go up and down stairs step over step Baseline:  Goal status: progressing 12/22/23, for the most part can do this but still has trouble at her home stairs   PLAN:  PT FREQUENCY: 3x/week to start and then will decrease as she improves  PT DURATION: 12 weeks  PLANNED INTERVENTIONS: 97164- PT Re-evaluation, 97110-Therapeutic exercises, 97530- Therapeutic activity, 97112- Neuromuscular re-education, 97535- Self Care, [id number]- Manual therapy, [phone]- Gait training, [phone]- Electrical stimulation (unattended), 97016- Vasopneumatic device, Patient/Family education, Balance training, Stair training, Joint mobilization, and Cryotherapy  PLAN FOR NEXT SESSION: continuing to do very well with her walking and her ROM   Dorinda Stehr W, PT 12/25/2023, 2:49 PM

## 2023-12-29 ENCOUNTER — Encounter: Payer: Self-pay | Admitting: Physical Therapy

## 2023-12-29 ENCOUNTER — Ambulatory Visit: Admitting: Physical Therapy

## 2023-12-29 DIAGNOSIS — M25562 Pain in left knee: Secondary | ICD-10-CM | POA: Diagnosis not present

## 2023-12-29 DIAGNOSIS — G8929 Other chronic pain: Secondary | ICD-10-CM

## 2023-12-29 DIAGNOSIS — M25662 Stiffness of left knee, not elsewhere classified: Secondary | ICD-10-CM

## 2023-12-29 DIAGNOSIS — R600 Localized edema: Secondary | ICD-10-CM

## 2023-12-29 DIAGNOSIS — R262 Difficulty in walking, not elsewhere classified: Secondary | ICD-10-CM

## 2023-12-29 NOTE — Therapy (Signed)
 OUTPATIENT PHYSICAL THERAPY LOWER EXTREMITY TREATMENT   Patient Name: [name] MRN: [id number] DOB:07/01/1960, 63 y.o., female Today's Date: 12/29/2023  END OF SESSION:  PT End of Session - 12/29/23 1532     Visit Number 18    Date for Recertification  02/13/24    Authorization Type [employer]    PT Start Time 1530    PT Stop Time 1630    PT Time Calculation (min) 60 min    Activity Tolerance Patient tolerated treatment well    Behavior During Therapy WFL for tasks assessed/performed           Past Medical History:  Diagnosis Date   Anxiety    Arthritis    Back pain of lumbar region with sciatica    Cancer (HCC)    breast   Depression    Family history of anesthesia complication    grandfather died under anesthesia 70's- had heart issues   H/O carpal tunnel repair 2014   Headache(784.0)    Hyperlipidemia    Hypertension    Hypothyroidism    Medial meniscus tear    PONV (postoperative nausea and vomiting)    Pre-diabetes    Sleep apnea    cpap since 10   Past Surgical History:  Procedure Laterality Date   BILATERAL TOTAL MASTECTOMY WITH AXILLARY LYMPH NODE DISSECTION     BREAST RECONSTRUCTION Left 07/17/2023   for encapsulation,  done at [facility]   CERVICAL DISC ARTHROPLASTY N/A 03/19/2013   Procedure: CERVICAL FIVE TO SIX, CERVICAL SIX TO SEVEN CERVICAL ANTERIOR DISC ARTHROPLASTY;  Surgeon: Victory Gens, MD;  Location: [facility];  Service: Neurosurgery;  Laterality: N/A;  C56 C67 artificial disc replacement   CESAREAN SECTION     COLONOSCOPY     DILATATION & CURETTAGE/HYSTEROSCOPY WITH TRUECLEAR N/A 11/25/2013   Procedure: DILATATION & CURETTAGE/HYSTEROSCOPY WITH TRUCLEAR;  Surgeon: Charlie CHRISTELLA Croak, MD;  Location: [facility];  Service: Gynecology;  Laterality: N/A;   ECTOPIC PREGNANCY SURGERY     GALLBLADDER SURGERY     MOUTH SURGERY     child- dog bite   SPINAL FUSION     2023   STERIOD INJECTION Left 09/09/2022   Procedure: LEFT KNEE STEROID INJECTION;   Surgeon: Melodi Lerner, MD;  Location: [facility];  Service: Orthopedics;  Laterality: Left;  left knee injection 0928   TONSILLECTOMY     TOTAL KNEE ARTHROPLASTY Right 09/09/2022   Procedure: TOTAL KNEE ARTHROPLASTY;  Surgeon: Melodi Lerner, MD;  Location: [facility];  Service: Orthopedics;  Laterality: Right;   TOTAL KNEE ARTHROPLASTY Left 11/10/2023   Procedure: ARTHROPLASTY, KNEE, TOTAL;  Surgeon: Melodi Lerner, MD;  Location: [facility];  Service: Orthopedics;  Laterality: Left;   TUBAL LIGATION     Patient Active Problem List   Diagnosis Date Noted   Primary osteoarthritis of left knee 11/10/2023   OA (osteoarthritis) of knee 09/09/2022   Exertional dyspnea 04/17/2022   Elevated coronary artery calcium  score 04/17/2022   Spondylolisthesis at L4-L5 level 12/27/2021   Ductal carcinoma in situ (DCIS) of left breast 01/19/2018   Generalized anxiety disorder 11/07/2017   Depression 11/07/2017   Insomnia 11/07/2017   Cervical spondylosis 03/19/2013    PCP: Onita, MD  REFERRING PROVIDER: Bethanie, PA  REFERRING DIAG: s/p left TKA  THERAPY DIAG:  Acute pain of left knee  Stiffness of left knee, not elsewhere classified  Localized edema  Difficulty in walking, not elsewhere classified  Chronic bilateral back pain, unspecified back location  Rationale for  Evaluation and Treatment: Rehabilitation  ONSET DATE: 11/10/23  SUBJECTIVE:   SUBJECTIVE STATEMENT: Doing okay, no issues, just stiff  Patient underwent a left TKA on 11/10/23, she reports that she is doing okay, reports that she had noodle leg, reports trying to do the exercises daily  PERTINENT HISTORY: 63 yo female s/p L TKA on 11/10/23. PMH: R TKA, back pain, L3-4, 4-5 fusion in 2023, HTN, DM, C-spine arthroplasty  PAIN:  Are you having pain? Yes: NPRS scale: 5/10 Pain location: left knee Pain description: ache, sharpe , tender Aggravating factors: moving, walking , bending, I can't straighten the leg pain up to  10/10 Relieving factors: pain meds, ice, rest, elevation at best pain a 7/10  PRECAUTIONS: Other: back surgery in the past  RED FLAGS: None   WEIGHT BEARING RESTRICTIONS: No  FALLS:  Has patient fallen in last 6 months? No  LIVING ENVIRONMENT: Lives with: lives with their family Lives in: House/apartment Stairs: Yes: Internal: 20 steps; can reach both Has following equipment at home: Single point cane, Walker - 2 wheeled, shower chair, and Grab bars  OCCUPATION: volunteers  PLOF: Independent and no device was doing yoga  PATIENT GOALS: walk without device, have no pain, normal ROM, go up and down stairs step over step  NEXT MD VISIT: November 3  OBJECTIVE:  Note: Objective measures were completed at Evaluation unless otherwise noted.  DIAGNOSTIC FINDINGS: none  COGNITION: Overall cognitive status: Within functional limits for tasks assessed     SENSATION: WFL  EDEMA:  Circumferential: right mid patella 44cm, left 49 cm  MUSCLE LENGTH: Not tested  POSTURE: rounded shoulders, forward head, and knee guarded   PALPATION: Very sore and tender, some bruising in the thigh, very red in the medial knee with heat  LOWER EXTREMITY ROM:  Active ROM LEFT AROM 11/13/23 eval Left PROM 11/13/23 eval AROM LEFT 11/24/23 AROM LEft  12/08/23 AROM LEft 12/22/23  Hip flexion       Hip extension       Hip abduction       Hip adduction       Hip internal rotation       Hip external rotation       Knee flexion 80 85 95 102 active Passive to 110 111  Knee extension 25 20 20 15 7   Ankle dorsiflexion       Ankle plantarflexion       Ankle inversion       Ankle eversion        (Blank rows = not tested)  LOWER EXTREMITY MMT:  MMT Right eval Left eval  Hip flexion    Hip extension    Hip abduction    Hip adduction    Hip internal rotation    Hip external rotation    Knee flexion    Knee extension    Ankle dorsiflexion    Ankle plantarflexion    Ankle  inversion    Ankle eversion     (Blank rows = not tested)  FUNCTIONAL TESTS:  5 times sit to stand: 35 seconds Timed up and go (TUG): 56 seconds with FWW  GAIT: Distance walked: 60 feet Assistive device utilized: Walker - 2 wheeled Level of assistance: CGA Comments: step to pattern, shortened stance phase on the left  TREATMENT DATE:  12/29/23 Bike level 5 x 6 minutes 25# HS curls 5# extension Leg press 20# then no weight as low as you can go 3 block more square step overs 40# resisted gait all directions Passive stretch into flexion On airex reaching with foot to colors Vaso medium pressure in elevation 34 degrees  12/25/23 Bike seat #5 , level 5 x 6 minutes More square 3 blocks step overs 25# HS curls 5# leg extension Bosu upside down  On airex toe touches on colored cones called out by PT Leg press 20# then working on flexion Passive stretch to the left knee Vaso medium pressure 34 degrees in elevation  12/24/23 Bike level 4 seat # 5 x 5 minutes Gait outside Stairs step over step On bosu upside down reaching More square 3 blocks step over 8 step ups alternating feet 25# HS curls 5# extension Passive stretch Feet on ball bridge, isometric abs Vaso high pressure 34 degrees  12/22/23 Bike level 4 x 6 minutes seat #7 25# HS curls On airex cone toe touches On airex ball toss On airex eyes closed , head turns and nods Slant board stretch Leg press 20# 2x10 LAQ 5# PROM extension and flexion, scar and joint mobs Vaso in elevation 34 degrees medium pressure  12/17/23 Walk outdoors 1 big lap  Bike seat  L2x L knee PROM w/ end range holds  Patella mobs  Tibiofemoral jt mobs Leg ext 5# 2x10 HS curls 25# 2x10 Step ups 6 Vaso medium pressure 34d    12/15/23 Bike position #8 x 7 minutes 6in forward and lateral step ups x10  each  Heel raised black bar 2x10 Leg press 40lb 2x15 20lb resisted side steps over WaTE x5  L knee PROM w/ end rnage holds  Patella mobs  Tibiofemoral jt mobs Vaso medium pressure 34 degrees  12/11/23 Bike position #8 x 6 minutes Stairs step over step up and down More square 3 blocks step overs CGA Outside gait curbs and uneven terrain Leg press 40# x 10. Then 20# working on flexion 25# HS curls Passive stretch flexion and extension Scar and patellar mobs Vaso medium pressure 34 degrees   12/08/23 Bike full revs position #8 x 6 minutes Stairs step over step Tmill pushes 25# HS curls 5# Leg extension LEg press 20# both legs working on flexion Leg press no weight with the left only Passive stretch flexion and extension Feet on ball K2C, rotation, bridge, isometric abs Vaso medium pressure 34 degrees in elevation  12/04/23 Nustep level 5 x 5 minutes Bike level 3 x 5 minutes seat position 8 with some trunk lean Walk to other building with SPC then stairs step over step a lot of verbal and tactile cues Leg press 20#  Gait with cues for bending at toe off Passive stretch extension and flexion Vaso 34 degrees in elevation  12/03/23 Nustep level 5 x 6 minutes Bike level 2 x 4 minutes Leg curls 20# 2x15 Leg Ext 5lb 2x10 LLE PROM w end range holds   Patellar mobs  Sit to stands 2x10 4in step up x10  6in x 3 each Resisted gait 30lb 4 way x 3 each  Vaso medium pressure 34 degrees  12/01/23 Nustep level 5 x 6 minutes Bike level 2 x 6 minutes Leg curls 20# 2x10 5# ext 20# and no weight leg press working on flexion 8 toe clears 8 lunge stretch PAssive stretch flexion and extension STM to the scar and the thigh  Demo of HEP for LLLD stretch for extension Vaso medium pressure 34 degrees  11/27/23 Bike full revs 6 minutes Nustep level 5 x 5 minutes 4 stairs up and down step over step 20# leg curls 20# Leg press  then no weight for ROM 2.5# LAQ 12 toe clears  and then lunge stretch Passive stretch flexion and extension STM to the scar Vaso medium pressure 34 degrees   PATIENT EDUCATION:  Education details: reviewed RICE, gait, HEP Person educated: Patient Education method: Explanation, Demonstration, Tactile cues, Verbal cues, and Handouts Education comprehension: verbalized understanding, returned demonstration, verbal cues required, and tactile cues required  HOME EXERCISE PROGRAM: QS, chair scoots for flexion, ankle pumps, SLR, LAQ  ASSESSMENT:  CLINICAL IMPRESSION: Patient continues to be a little stiff however she gets moving and the ROM returns, she will be travelling this Friday and may have to go some long distances, She still has some medial knee pain. Pt will benefit from continues skilled PT service to increase L knee strength and ROM to improve her functional capacity.  Patient is a 63 y.o. female who was seen today for physical therapy evaluation and treatment for s/p left TKA on 11/10/23.  She is in a lot pain, has significant tenderness, a lot of redness and warmth in the medial knee.  Reports that she cannot raise the foot up without help but with encouragement she could, was able to do a SAQ.  TUG time was 56 seconds.   Has a lot of edema.   OBJECTIVE IMPAIRMENTS: Abnormal gait, cardiopulmonary status limiting activity, decreased activity tolerance, decreased balance, decreased coordination, decreased endurance, decreased mobility, difficulty walking, decreased ROM, decreased strength, increased edema, increased muscle spasms, impaired flexibility, and pain.    REHAB POTENTIAL: Good  CLINICAL DECISION MAKING: Evolving/moderate complexity  EVALUATION COMPLEXITY: Moderate   GOALS: Goals reviewed with patient? Yes  SHORT TERM GOALS: Target date: 12/06/23 Independent with initial HEP Baseline: Goal status: met 11/19/23  LONG TERM GOALS: Target date: 04/12/24  Independent with advanced HEP Baseline:  Goal status:  INITIAL  2.  Walk with LRAD all distances with minimal deviations Baseline:  Goal status: progressing 12/22/23  3.  Increase AROM of the left knee to 0-120 degrees flexion Baseline:  Goal status: progressing 12/22/23  4.  Decrease pain 50% Baseline:  Goal status: progressing 12/22/23  5.  Decrease TUG time to 18 seconds Baseline:  Goal status: met 12/22/23  6.  Go up and down stairs step over step Baseline:  Goal status: progressing 12/22/23, for the most part can do this but still has trouble at her home stairs   PLAN:  PT FREQUENCY: 3x/week to start and then will decrease as she improves  PT DURATION: 12 weeks  PLANNED INTERVENTIONS: 97164- PT Re-evaluation, 97110-Therapeutic exercises, 97530- Therapeutic activity, 97112- Neuromuscular re-education, 97535- Self Care, [id number]- Manual therapy, [phone]- Gait training, [phone]- Electrical stimulation (unattended), 97016- Vasopneumatic device, Patient/Family education, Balance training, Stair training, Joint mobilization, and Cryotherapy  PLAN FOR NEXT SESSION: continuing to do very well with her walking and her ROM   Miliano Cotten W, PT 12/29/2023, 3:34 PM

## 2023-12-31 ENCOUNTER — Ambulatory Visit: Admitting: Physical Therapy

## 2023-12-31 ENCOUNTER — Encounter: Payer: Self-pay | Admitting: Physical Therapy

## 2023-12-31 ENCOUNTER — Ambulatory Visit: Admitting: Professional Counselor

## 2023-12-31 DIAGNOSIS — M25662 Stiffness of left knee, not elsewhere classified: Secondary | ICD-10-CM

## 2023-12-31 DIAGNOSIS — M25562 Pain in left knee: Secondary | ICD-10-CM | POA: Diagnosis not present

## 2023-12-31 DIAGNOSIS — G8929 Other chronic pain: Secondary | ICD-10-CM

## 2023-12-31 DIAGNOSIS — R262 Difficulty in walking, not elsewhere classified: Secondary | ICD-10-CM

## 2023-12-31 DIAGNOSIS — R600 Localized edema: Secondary | ICD-10-CM

## 2023-12-31 NOTE — Therapy (Signed)
 OUTPATIENT PHYSICAL THERAPY LOWER EXTREMITY TREATMENT   Patient Name: [name] MRN: [id number] DOB:1960/08/16, 63 y.o., female Today's Date: 12/31/2023  END OF SESSION:  PT End of Session - 12/31/23 1544     Visit Number 19    Date for Recertification  02/13/24    Authorization Type [employer]    PT Start Time 1530    PT Stop Time 1630    PT Time Calculation (min) 60 min    Activity Tolerance Patient tolerated treatment well    Behavior During Therapy WFL for tasks assessed/performed           Past Medical History:  Diagnosis Date   Anxiety    Arthritis    Back pain of lumbar region with sciatica    Cancer (HCC)    breast   Depression    Family history of anesthesia complication    grandfather died under anesthesia 70's- had heart issues   H/O carpal tunnel repair 2014   Headache(784.0)    Hyperlipidemia    Hypertension    Hypothyroidism    Medial meniscus tear    PONV (postoperative nausea and vomiting)    Pre-diabetes    Sleep apnea    cpap since 10   Past Surgical History:  Procedure Laterality Date   BILATERAL TOTAL MASTECTOMY WITH AXILLARY LYMPH NODE DISSECTION     BREAST RECONSTRUCTION Left 07/17/2023   for encapsulation,  done at [facility]   CERVICAL DISC ARTHROPLASTY N/A 03/19/2013   Procedure: CERVICAL FIVE TO SIX, CERVICAL SIX TO SEVEN CERVICAL ANTERIOR DISC ARTHROPLASTY;  Surgeon: Victory Gens, MD;  Location: [facility];  Service: Neurosurgery;  Laterality: N/A;  C56 C67 artificial disc replacement   CESAREAN SECTION     COLONOSCOPY     DILATATION & CURETTAGE/HYSTEROSCOPY WITH TRUECLEAR N/A 11/25/2013   Procedure: DILATATION & CURETTAGE/HYSTEROSCOPY WITH TRUCLEAR;  Surgeon: Charlie CHRISTELLA Croak, MD;  Location: [facility];  Service: Gynecology;  Laterality: N/A;   ECTOPIC PREGNANCY SURGERY     GALLBLADDER SURGERY     MOUTH SURGERY     child- dog bite   SPINAL FUSION     2023   STERIOD INJECTION Left 09/09/2022   Procedure: LEFT KNEE STEROID INJECTION;   Surgeon: Melodi Lerner, MD;  Location: [facility];  Service: Orthopedics;  Laterality: Left;  left knee injection 0928   TONSILLECTOMY     TOTAL KNEE ARTHROPLASTY Right 09/09/2022   Procedure: TOTAL KNEE ARTHROPLASTY;  Surgeon: Melodi Lerner, MD;  Location: [facility];  Service: Orthopedics;  Laterality: Right;   TOTAL KNEE ARTHROPLASTY Left 11/10/2023   Procedure: ARTHROPLASTY, KNEE, TOTAL;  Surgeon: Melodi Lerner, MD;  Location: [facility];  Service: Orthopedics;  Laterality: Left;   TUBAL LIGATION     Patient Active Problem List   Diagnosis Date Noted   Primary osteoarthritis of left knee 11/10/2023   OA (osteoarthritis) of knee 09/09/2022   Exertional dyspnea 04/17/2022   Elevated coronary artery calcium  score 04/17/2022   Spondylolisthesis at L4-L5 level 12/27/2021   Ductal carcinoma in situ (DCIS) of left breast 01/19/2018   Generalized anxiety disorder 11/07/2017   Depression 11/07/2017   Insomnia 11/07/2017   Cervical spondylosis 03/19/2013    PCP: Onita, MD  REFERRING PROVIDER: Bethanie, PA  REFERRING DIAG: s/p left TKA  THERAPY DIAG:  Acute pain of left knee  Stiffness of left knee, not elsewhere classified  Localized edema  Difficulty in walking, not elsewhere classified  Chronic bilateral back pain, unspecified back location  Rationale for  Evaluation and Treatment: Rehabilitation  ONSET DATE: 11/10/23  SUBJECTIVE:   SUBJECTIVE STATEMENT: I am flying on Friday, I have been on feet all day, a little sore  Patient underwent a left TKA on 11/10/23, she reports that she is doing okay, reports that she had noodle leg, reports trying to do the exercises daily  PERTINENT HISTORY: 63 yo female s/p L TKA on 11/10/23. PMH: R TKA, back pain, L3-4, 4-5 fusion in 2023, HTN, DM, C-spine arthroplasty  PAIN:  Are you having pain? Yes: NPRS scale: 5/10 Pain location: left knee Pain description: ache, sharpe , tender Aggravating factors: moving, walking , bending, I can't  straighten the leg pain up to 10/10 Relieving factors: pain meds, ice, rest, elevation at best pain a 7/10  PRECAUTIONS: Other: back surgery in the past  RED FLAGS: None   WEIGHT BEARING RESTRICTIONS: No  FALLS:  Has patient fallen in last 6 months? No  LIVING ENVIRONMENT: Lives with: lives with their family Lives in: House/apartment Stairs: Yes: Internal: 20 steps; can reach both Has following equipment at home: Single point cane, Walker - 2 wheeled, shower chair, and Grab bars  OCCUPATION: volunteers  PLOF: Independent and no device was doing yoga  PATIENT GOALS: walk without device, have no pain, normal ROM, go up and down stairs step over step  NEXT MD VISIT: November 3  OBJECTIVE:  Note: Objective measures were completed at Evaluation unless otherwise noted.  DIAGNOSTIC FINDINGS: none  COGNITION: Overall cognitive status: Within functional limits for tasks assessed     SENSATION: WFL  EDEMA:  Circumferential: right mid patella 44cm, left 49 cm  MUSCLE LENGTH: Not tested  POSTURE: rounded shoulders, forward head, and knee guarded   PALPATION: Very sore and tender, some bruising in the thigh, very red in the medial knee with heat  LOWER EXTREMITY ROM:  Active ROM LEFT AROM 11/13/23 eval Left PROM 11/13/23 eval AROM LEFT 11/24/23 AROM LEft  12/08/23 AROM LEft 12/22/23  Hip flexion       Hip extension       Hip abduction       Hip adduction       Hip internal rotation       Hip external rotation       Knee flexion 80 85 95 102 active Passive to 110 111  Knee extension 25 20 20 15 7   Ankle dorsiflexion       Ankle plantarflexion       Ankle inversion       Ankle eversion        (Blank rows = not tested)  LOWER EXTREMITY MMT:  MMT Right eval Left eval  Hip flexion    Hip extension    Hip abduction    Hip adduction    Hip internal rotation    Hip external rotation    Knee flexion    Knee extension    Ankle dorsiflexion    Ankle  plantarflexion    Ankle inversion    Ankle eversion     (Blank rows = not tested)  FUNCTIONAL TESTS:  5 times sit to stand: 35 seconds Timed up and go (TUG): 56 seconds with FWW  GAIT: Distance walked: 60 feet Assistive device utilized: Walker - 2 wheeled Level of assistance: CGA Comments: step to pattern, shortened stance phase on the left  TREATMENT DATE:  12/31/23 Gait around the back building, then the parking [place] and then to the front door to simulate walking in airport Bike Level 5 x 6 minutes LEg curls 25# 2 x 12 5# leg extension 40# resisted gait all directions 8 toe clears Feet on ball K2C, rotation Passive stretch left knee Vaso left knee medium pressure 34 degrees  12/29/23 Bike level 5 x 6 minutes 25# HS curls 5# extension Leg press 20# then no weight as low as you can go 3 block more square step overs 40# resisted gait all directions Passive stretch into flexion On airex reaching with foot to colors Vaso medium pressure in elevation 34 degrees  12/25/23 Bike seat #5 , level 5 x 6 minutes More square 3 blocks step overs 25# HS curls 5# leg extension Bosu upside down  On airex toe touches on colored cones called out by PT Leg press 20# then working on flexion Passive stretch to the left knee Vaso medium pressure 34 degrees in elevation  12/24/23 Bike level 4 seat # 5 x 5 minutes Gait outside Stairs step over step On bosu upside down reaching More square 3 blocks step over 8 step ups alternating feet 25# HS curls 5# extension Passive stretch Feet on ball bridge, isometric abs Vaso high pressure 34 degrees  12/22/23 Bike level 4 x 6 minutes seat #7 25# HS curls On airex cone toe touches On airex ball toss On airex eyes closed , head turns and nods Slant board stretch Leg press 20# 2x10 LAQ 5# PROM extension  and flexion, scar and joint mobs Vaso in elevation 34 degrees medium pressure  12/17/23 Walk outdoors 1 big lap  Bike seat  L2x L knee PROM w/ end range holds  Patella mobs  Tibiofemoral jt mobs Leg ext 5# 2x10 HS curls 25# 2x10 Step ups 6 Vaso medium pressure 34d    12/15/23 Bike position #8 x 7 minutes 6in forward and lateral step ups x10 each  Heel raised black bar 2x10 Leg press 40lb 2x15 20lb resisted side steps over WaTE x5  L knee PROM w/ end rnage holds  Patella mobs  Tibiofemoral jt mobs Vaso medium pressure 34 degrees  12/11/23 Bike position #8 x 6 minutes Stairs step over step up and down More square 3 blocks step overs CGA Outside gait curbs and uneven terrain Leg press 40# x 10. Then 20# working on flexion 25# HS curls Passive stretch flexion and extension Scar and patellar mobs Vaso medium pressure 34 degrees   12/08/23 Bike full revs position #8 x 6 minutes Stairs step over step Tmill pushes 25# HS curls 5# Leg extension LEg press 20# both legs working on flexion Leg press no weight with the left only Passive stretch flexion and extension Feet on ball K2C, rotation, bridge, isometric abs Vaso medium pressure 34 degrees in elevation  12/04/23 Nustep level 5 x 5 minutes Bike level 3 x 5 minutes seat position 8 with some trunk lean Walk to other building with SPC then stairs step over step a lot of verbal and tactile cues Leg press 20#  Gait with cues for bending at toe off Passive stretch extension and flexion Vaso 34 degrees in elevation  12/03/23 Nustep level 5 x 6 minutes Bike level 2 x 4 minutes Leg curls 20# 2x15 Leg Ext 5lb 2x10 LLE PROM w end range holds   Patellar mobs  Sit to stands 2x10 4in step up x10  6in x 3 each  Resisted gait 30lb 4 way x 3 each  Vaso medium pressure 34 degrees  12/01/23 Nustep level 5 x 6 minutes Bike level 2 x 6 minutes Leg curls 20# 2x10 5# ext 20# and no weight leg press working on  flexion 8 toe clears 8 lunge stretch PAssive stretch flexion and extension STM to the scar and the thigh Demo of HEP for LLLD stretch for extension Vaso medium pressure 34 degrees  11/27/23 Bike full revs 6 minutes Nustep level 5 x 5 minutes 4 stairs up and down step over step 20# leg curls 20# Leg press  then no weight for ROM 2.5# LAQ 12 toe clears and then lunge stretch Passive stretch flexion and extension STM to the scar Vaso medium pressure 34 degrees   PATIENT EDUCATION:  Education details: reviewed RICE, gait, HEP Person educated: Patient Education method: Explanation, Demonstration, Tactile cues, Verbal cues, and Handouts Education comprehension: verbalized understanding, returned demonstration, verbal cues required, and tactile cues required  HOME EXERCISE PROGRAM: QS, chair scoots for flexion, ankle pumps, SLR, LAQ  ASSESSMENT:  CLINICAL IMPRESSION: Patient will be travelling this Friday we practiced a longer walk today at a fairly brisk pace, she was fatigued and short of breath and at the end did have some limping on the left.  She still has some medial knee pain. Pt will benefit from continues skilled PT service to increase L knee strength and ROM to improve her functional capacity.  Patient is a 63 y.o. female who was seen today for physical therapy evaluation and treatment for s/p left TKA on 11/10/23.  She is in a lot pain, has significant tenderness, a lot of redness and warmth in the medial knee.  Reports that she cannot raise the foot up without help but with encouragement she could, was able to do a SAQ.  TUG time was 56 seconds.   Has a lot of edema.   OBJECTIVE IMPAIRMENTS: Abnormal gait, cardiopulmonary status limiting activity, decreased activity tolerance, decreased balance, decreased coordination, decreased endurance, decreased mobility, difficulty walking, decreased ROM, decreased strength, increased edema, increased muscle spasms, impaired  flexibility, and pain.    REHAB POTENTIAL: Good  CLINICAL DECISION MAKING: Evolving/moderate complexity  EVALUATION COMPLEXITY: Moderate   GOALS: Goals reviewed with patient? Yes  SHORT TERM GOALS: Target date: 12/06/23 Independent with initial HEP Baseline: Goal status: met 11/19/23  LONG TERM GOALS: Target date: 04/12/24  Independent with advanced HEP Baseline:  Goal status: INITIAL  2.  Walk with LRAD all distances with minimal deviations Baseline:  Goal status: progressing 12/22/23  3.  Increase AROM of the left knee to 0-120 degrees flexion Baseline:  Goal status: progressing 12/22/23  4.  Decrease pain 50% Baseline:  Goal status: progressing 12/22/23  5.  Decrease TUG time to 18 seconds Baseline:  Goal status: met 12/22/23  6.  Go up and down stairs step over step Baseline:  Goal status: progressing 12/22/23, for the most part can do this but still has trouble at her home stairs   PLAN:  PT FREQUENCY: 3x/week to start and then will decrease as she improves  PT DURATION: 12 weeks  PLANNED INTERVENTIONS: 97164- PT Re-evaluation, 97110-Therapeutic exercises, 97530- Therapeutic activity, 97112- Neuromuscular re-education, 97535- Self Care, [id number]- Manual therapy, [phone]- Gait training, [phone]- Electrical stimulation (unattended), 97016- Vasopneumatic device, Patient/Family education, Balance training, Stair training, Joint mobilization, and Cryotherapy  PLAN FOR NEXT SESSION: continuing to do very well with her walking and her ROM   Greidys Deland W, PT 12/31/2023, 3:45  PM

## 2024-01-01 ENCOUNTER — Ambulatory Visit: Admitting: Physical Therapy

## 2024-01-01 ENCOUNTER — Encounter: Payer: Self-pay | Admitting: Physical Therapy

## 2024-01-01 DIAGNOSIS — R600 Localized edema: Secondary | ICD-10-CM

## 2024-01-01 DIAGNOSIS — M25662 Stiffness of left knee, not elsewhere classified: Secondary | ICD-10-CM

## 2024-01-01 DIAGNOSIS — M25562 Pain in left knee: Secondary | ICD-10-CM | POA: Diagnosis not present

## 2024-01-01 DIAGNOSIS — R262 Difficulty in walking, not elsewhere classified: Secondary | ICD-10-CM

## 2024-01-01 DIAGNOSIS — G8929 Other chronic pain: Secondary | ICD-10-CM

## 2024-01-01 NOTE — Therapy (Signed)
 OUTPATIENT PHYSICAL THERAPY LOWER EXTREMITY TREATMENT   Patient Name: [name] MRN: [id number] DOB:September 05, 1960, 63 y.o., female Today's Date: 01/01/2024  END OF SESSION:  PT End of Session - 01/01/24 0931     Visit Number 20    Date for Recertification  02/13/24    Authorization Type [employer]    PT Start Time 0930    PT Stop Time 1015    PT Time Calculation (min) 45 min    Activity Tolerance Patient tolerated treatment well    Behavior During Therapy WFL for tasks assessed/performed           Past Medical History:  Diagnosis Date   Anxiety    Arthritis    Back pain of lumbar region with sciatica    Cancer (HCC)    breast   Depression    Family history of anesthesia complication    grandfather died under anesthesia 70's- had heart issues   H/O carpal tunnel repair 2014   Headache(784.0)    Hyperlipidemia    Hypertension    Hypothyroidism    Medial meniscus tear    PONV (postoperative nausea and vomiting)    Pre-diabetes    Sleep apnea    cpap since 10   Past Surgical History:  Procedure Laterality Date   BILATERAL TOTAL MASTECTOMY WITH AXILLARY LYMPH NODE DISSECTION     BREAST RECONSTRUCTION Left 07/17/2023   for encapsulation,  done at [facility]   CERVICAL DISC ARTHROPLASTY N/A 03/19/2013   Procedure: CERVICAL FIVE TO SIX, CERVICAL SIX TO SEVEN CERVICAL ANTERIOR DISC ARTHROPLASTY;  Surgeon: Victory Gens, MD;  Location: [facility];  Service: Neurosurgery;  Laterality: N/A;  C56 C67 artificial disc replacement   CESAREAN SECTION     COLONOSCOPY     DILATATION & CURETTAGE/HYSTEROSCOPY WITH TRUECLEAR N/A 11/25/2013   Procedure: DILATATION & CURETTAGE/HYSTEROSCOPY WITH TRUCLEAR;  Surgeon: Charlie CHRISTELLA Croak, MD;  Location: [facility];  Service: Gynecology;  Laterality: N/A;   ECTOPIC PREGNANCY SURGERY     GALLBLADDER SURGERY     MOUTH SURGERY     child- dog bite   SPINAL FUSION     2023   STERIOD INJECTION Left 09/09/2022   Procedure: LEFT KNEE STEROID INJECTION;   Surgeon: Melodi Lerner, MD;  Location: [facility];  Service: Orthopedics;  Laterality: Left;  left knee injection 0928   TONSILLECTOMY     TOTAL KNEE ARTHROPLASTY Right 09/09/2022   Procedure: TOTAL KNEE ARTHROPLASTY;  Surgeon: Melodi Lerner, MD;  Location: [facility];  Service: Orthopedics;  Laterality: Right;   TOTAL KNEE ARTHROPLASTY Left 11/10/2023   Procedure: ARTHROPLASTY, KNEE, TOTAL;  Surgeon: Melodi Lerner, MD;  Location: [facility];  Service: Orthopedics;  Laterality: Left;   TUBAL LIGATION     Patient Active Problem List   Diagnosis Date Noted   Primary osteoarthritis of left knee 11/10/2023   OA (osteoarthritis) of knee 09/09/2022   Exertional dyspnea 04/17/2022   Elevated coronary artery calcium  score 04/17/2022   Spondylolisthesis at L4-L5 level 12/27/2021   Ductal carcinoma in situ (DCIS) of left breast 01/19/2018   Generalized anxiety disorder 11/07/2017   Depression 11/07/2017   Insomnia 11/07/2017   Cervical spondylosis 03/19/2013    PCP: Onita, MD  REFERRING PROVIDER: Bethanie, PA  REFERRING DIAG: s/p left TKA  THERAPY DIAG:  Acute pain of left knee  Stiffness of left knee, not elsewhere classified  Localized edema  Difficulty in walking, not elsewhere classified  Chronic bilateral back pain, unspecified back location  Rationale for  Evaluation and Treatment: Rehabilitation  ONSET DATE: 11/10/23  SUBJECTIVE:   SUBJECTIVE STATEMENT: I am flying tomorrow, I am a little stiff today  Patient underwent a left TKA on 11/10/23, she reports that she is doing okay, reports that she had noodle leg, reports trying to do the exercises daily  PERTINENT HISTORY: 63 yo female s/p L TKA on 11/10/23. PMH: R TKA, back pain, L3-4, 4-5 fusion in 2023, HTN, DM, C-spine arthroplasty  PAIN:  Are you having pain? Yes: NPRS scale: 5/10 Pain location: left knee Pain description: ache, sharpe , tender Aggravating factors: moving, walking , bending, I can't straighten the leg  pain up to 10/10 Relieving factors: pain meds, ice, rest, elevation at best pain a 7/10  PRECAUTIONS: Other: back surgery in the past  RED FLAGS: None   WEIGHT BEARING RESTRICTIONS: No  FALLS:  Has patient fallen in last 6 months? No  LIVING ENVIRONMENT: Lives with: lives with their family Lives in: House/apartment Stairs: Yes: Internal: 20 steps; can reach both Has following equipment at home: Single point cane, Walker - 2 wheeled, shower chair, and Grab bars  OCCUPATION: volunteers  PLOF: Independent and no device was doing yoga  PATIENT GOALS: walk without device, have no pain, normal ROM, go up and down stairs step over step  NEXT MD VISIT: November 3  OBJECTIVE:  Note: Objective measures were completed at Evaluation unless otherwise noted.  DIAGNOSTIC FINDINGS: none  COGNITION: Overall cognitive status: Within functional limits for tasks assessed     SENSATION: WFL  EDEMA:  Circumferential: right mid patella 44cm, left 49 cm  MUSCLE LENGTH: Not tested  POSTURE: rounded shoulders, forward head, and knee guarded   PALPATION: Very sore and tender, some bruising in the thigh, very red in the medial knee with heat  LOWER EXTREMITY ROM:  Active ROM LEFT AROM 11/13/23 eval Left PROM 11/13/23 eval AROM LEFT 11/24/23 AROM LEft  12/08/23 AROM LEft 12/22/23 AROM Left 01/01/24  Hip flexion        Hip extension        Hip abduction        Hip adduction        Hip internal rotation        Hip external rotation        Knee flexion 80 85 95 102 active Passive to 110 111 116  Knee extension 25 20 20 15 7 5   Ankle dorsiflexion        Ankle plantarflexion        Ankle inversion        Ankle eversion         (Blank rows = not tested)  LOWER EXTREMITY MMT:  MMT Right eval Left eval  Hip flexion    Hip extension    Hip abduction    Hip adduction    Hip internal rotation    Hip external rotation    Knee flexion    Knee extension    Ankle  dorsiflexion    Ankle plantarflexion    Ankle inversion    Ankle eversion     (Blank rows = not tested)  FUNCTIONAL TESTS:  5 times sit to stand: 35 seconds Timed up and go (TUG): 56 seconds with FWW  GAIT: Distance walked: 60 feet Assistive device utilized: Walker - 2 wheeled Level of assistance: CGA Comments: step to pattern, shortened stance phase on the left  TREATMENT DATE:  01/01/24 Bike seat #5 and then moved to 4 level 4 x 7 minutes 25# leg curls 5# leg extension 20# leg press Passive stretch to the left leg flexion and extension STM to the left LE and the back Vaso to the left leg high pressure 34 degrees  12/31/23 Gait around the back building, then the parking [place] and then to the front door to simulate walking in airport Bike Level 5 x 6 minutes LEg curls 25# 2 x 12 5# leg extension 40# resisted gait all directions 8 toe clears Feet on ball K2C, rotation Passive stretch left knee Vaso left knee medium pressure 34 degrees  12/29/23 Bike level 5 x 6 minutes 25# HS curls 5# extension Leg press 20# then no weight as low as you can go 3 block more square step overs 40# resisted gait all directions Passive stretch into flexion On airex reaching with foot to colors Vaso medium pressure in elevation 34 degrees  12/25/23 Bike seat #5 , level 5 x 6 minutes More square 3 blocks step overs 25# HS curls 5# leg extension Bosu upside down  On airex toe touches on colored cones called out by PT Leg press 20# then working on flexion Passive stretch to the left knee Vaso medium pressure 34 degrees in elevation  12/24/23 Bike level 4 seat # 5 x 5 minutes Gait outside Stairs step over step On bosu upside down reaching More square 3 blocks step over 8 step ups alternating feet 25# HS curls 5# extension Passive stretch Feet on  ball bridge, isometric abs Vaso high pressure 34 degrees  12/22/23 Bike level 4 x 6 minutes seat #7 25# HS curls On airex cone toe touches On airex ball toss On airex eyes closed , head turns and nods Slant board stretch Leg press 20# 2x10 LAQ 5# PROM extension and flexion, scar and joint mobs Vaso in elevation 34 degrees medium pressure  12/17/23 Walk outdoors 1 big lap  Bike seat  L2x L knee PROM w/ end range holds  Patella mobs  Tibiofemoral jt mobs Leg ext 5# 2x10 HS curls 25# 2x10 Step ups 6 Vaso medium pressure 34d    12/15/23 Bike position #8 x 7 minutes 6in forward and lateral step ups x10 each  Heel raised black bar 2x10 Leg press 40lb 2x15 20lb resisted side steps over WaTE x5  L knee PROM w/ end rnage holds  Patella mobs  Tibiofemoral jt mobs Vaso medium pressure 34 degrees  12/11/23 Bike position #8 x 6 minutes Stairs step over step up and down More square 3 blocks step overs CGA Outside gait curbs and uneven terrain Leg press 40# x 10. Then 20# working on flexion 25# HS curls Passive stretch flexion and extension Scar and patellar mobs Vaso medium pressure 34 degrees   12/08/23 Bike full revs position #8 x 6 minutes Stairs step over step Tmill pushes 25# HS curls 5# Leg extension LEg press 20# both legs working on flexion Leg press no weight with the left only Passive stretch flexion and extension Feet on ball K2C, rotation, bridge, isometric abs Vaso medium pressure 34 degrees in elevation  12/04/23 Nustep level 5 x 5 minutes Bike level 3 x 5 minutes seat position 8 with some trunk lean Walk to other building with SPC then stairs step over step a lot of verbal and tactile cues Leg press 20#  Gait with cues for bending at toe off Passive stretch extension and flexion Vaso  34 degrees in elevation  12/03/23 Nustep level 5 x 6 minutes Bike level 2 x 4 minutes Leg curls 20# 2x15 Leg Ext 5lb 2x10 LLE PROM w end range holds    Patellar mobs  Sit to stands 2x10 4in step up x10  6in x 3 each Resisted gait 30lb 4 way x 3 each  Vaso medium pressure 34 degrees  PATIENT EDUCATION:  Education details: reviewed RICE, gait, HEP Person educated: Patient Education method: Explanation, Demonstration, Tactile cues, Verbal cues, and Handouts Education comprehension: verbalized understanding, returned demonstration, verbal cues required, and tactile cues required  HOME EXERCISE PROGRAM: QS, chair scoots for flexion, ankle pumps, SLR, LAQ  ASSESSMENT:  CLINICAL IMPRESSION: Patient will be travelling tomorrow, we went a little easier today and she has gained ROM again.  She had less medial knee pain, we did discuss some gentle movements while on the plane to limit stiffness. Pt will benefit from continues skilled PT service to increase L knee strength and ROM to improve her functional capacity.  Patient is a 63 y.o. female who was seen today for physical therapy evaluation and treatment for s/p left TKA on 11/10/23.  She is in a lot pain, has significant tenderness, a lot of redness and warmth in the medial knee.  Reports that she cannot raise the foot up without help but with encouragement she could, was able to do a SAQ.  TUG time was 56 seconds.   Has a lot of edema.   OBJECTIVE IMPAIRMENTS: Abnormal gait, cardiopulmonary status limiting activity, decreased activity tolerance, decreased balance, decreased coordination, decreased endurance, decreased mobility, difficulty walking, decreased ROM, decreased strength, increased edema, increased muscle spasms, impaired flexibility, and pain.    REHAB POTENTIAL: Good  CLINICAL DECISION MAKING: Evolving/moderate complexity  EVALUATION COMPLEXITY: Moderate   GOALS: Goals reviewed with patient? Yes  SHORT TERM GOALS: Target date: 12/06/23 Independent with initial HEP Baseline: Goal status: met 11/19/23  LONG TERM GOALS: Target date: 04/12/24  Independent with advanced  HEP Baseline:  Goal status: INITIAL  2.  Walk with LRAD all distances with minimal deviations Baseline:  Goal status: met 01/01/24  3.  Increase AROM of the left knee to 0-120 degrees flexion Baseline:  Goal status: progressing 12/22/23  4.  Decrease pain 50% Baseline:  Goal status: progressing 12/22/23  5.  Decrease TUG time to 18 seconds Baseline:  Goal status: met 12/22/23  6.  Go up and down stairs step over step Baseline:  Goal status: met 01/01/24  PLAN:  PT FREQUENCY: 3x/week to start and then will decrease as she improves  PT DURATION: 12 weeks  PLANNED INTERVENTIONS: 97164- PT Re-evaluation, 97110-Therapeutic exercises, 97530- Therapeutic activity, 97112- Neuromuscular re-education, 97535- Self Care, [id number]- Manual therapy, [phone]- Gait training, [phone]- Electrical stimulation (unattended), 97016- Vasopneumatic device, Patient/Family education, Balance training, Stair training, Joint mobilization, and Cryotherapy  PLAN FOR NEXT SESSION: see how she does with her trip   OBADIAH OZELL ORN, PT 01/01/2024, 9:33 AM

## 2024-01-05 ENCOUNTER — Ambulatory Visit: Admitting: Physical Therapy

## 2024-01-05 ENCOUNTER — Encounter: Payer: Self-pay | Admitting: Physical Therapy

## 2024-01-05 DIAGNOSIS — M6281 Muscle weakness (generalized): Secondary | ICD-10-CM

## 2024-01-05 DIAGNOSIS — R262 Difficulty in walking, not elsewhere classified: Secondary | ICD-10-CM

## 2024-01-05 DIAGNOSIS — M25662 Stiffness of left knee, not elsewhere classified: Secondary | ICD-10-CM

## 2024-01-05 DIAGNOSIS — R600 Localized edema: Secondary | ICD-10-CM

## 2024-01-05 DIAGNOSIS — Z96651 Presence of right artificial knee joint: Secondary | ICD-10-CM

## 2024-01-05 DIAGNOSIS — M25562 Pain in left knee: Secondary | ICD-10-CM

## 2024-01-05 NOTE — Therapy (Signed)
 OUTPATIENT PHYSICAL THERAPY LOWER EXTREMITY TREATMENT   Patient Name: [name] MRN: [id number] DOB:August 24, 1960, 63 y.o., female Today's Date: 01/05/2024  END OF SESSION:  PT End of Session - 01/05/24 1534     Visit Number 21    Date for Recertification  02/13/24    Authorization Type [employer]    PT Start Time 1529    PT Stop Time 1825    PT Time Calculation (min) 176 min    Activity Tolerance Patient tolerated treatment well    Behavior During Therapy WFL for tasks assessed/performed           Past Medical History:  Diagnosis Date   Anxiety    Arthritis    Back pain of lumbar region with sciatica    Cancer (HCC)    breast   Depression    Family history of anesthesia complication    grandfather died under anesthesia 70's- had heart issues   H/O carpal tunnel repair 2014   Headache(784.0)    Hyperlipidemia    Hypertension    Hypothyroidism    Medial meniscus tear    PONV (postoperative nausea and vomiting)    Pre-diabetes    Sleep apnea    cpap since 10   Past Surgical History:  Procedure Laterality Date   BILATERAL TOTAL MASTECTOMY WITH AXILLARY LYMPH NODE DISSECTION     BREAST RECONSTRUCTION Left 07/17/2023   for encapsulation,  done at [facility]   CERVICAL DISC ARTHROPLASTY N/A 03/19/2013   Procedure: CERVICAL FIVE TO SIX, CERVICAL SIX TO SEVEN CERVICAL ANTERIOR DISC ARTHROPLASTY;  Surgeon: Victory Gens, MD;  Location: [facility];  Service: Neurosurgery;  Laterality: N/A;  C56 C67 artificial disc replacement   CESAREAN SECTION     COLONOSCOPY     DILATATION & CURETTAGE/HYSTEROSCOPY WITH TRUECLEAR N/A 11/25/2013   Procedure: DILATATION & CURETTAGE/HYSTEROSCOPY WITH TRUCLEAR;  Surgeon: Charlie CHRISTELLA Croak, MD;  Location: [facility];  Service: Gynecology;  Laterality: N/A;   ECTOPIC PREGNANCY SURGERY     GALLBLADDER SURGERY     MOUTH SURGERY     child- dog bite   SPINAL FUSION     2023   STERIOD INJECTION Left 09/09/2022   Procedure: LEFT KNEE STEROID INJECTION;   Surgeon: Melodi Lerner, MD;  Location: [facility];  Service: Orthopedics;  Laterality: Left;  left knee injection 0928   TONSILLECTOMY     TOTAL KNEE ARTHROPLASTY Right 09/09/2022   Procedure: TOTAL KNEE ARTHROPLASTY;  Surgeon: Melodi Lerner, MD;  Location: [facility];  Service: Orthopedics;  Laterality: Right;   TOTAL KNEE ARTHROPLASTY Left 11/10/2023   Procedure: ARTHROPLASTY, KNEE, TOTAL;  Surgeon: Melodi Lerner, MD;  Location: [facility];  Service: Orthopedics;  Laterality: Left;   TUBAL LIGATION     Patient Active Problem List   Diagnosis Date Noted   Primary osteoarthritis of left knee 11/10/2023   OA (osteoarthritis) of knee 09/09/2022   Exertional dyspnea 04/17/2022   Elevated coronary artery calcium  score 04/17/2022   Spondylolisthesis at L4-L5 level 12/27/2021   Ductal carcinoma in situ (DCIS) of left breast 01/19/2018   Generalized anxiety disorder 11/07/2017   Depression 11/07/2017   Insomnia 11/07/2017   Cervical spondylosis 03/19/2013    PCP: Onita, MD  REFERRING PROVIDER: Bethanie, PA  REFERRING DIAG: s/p left TKA  THERAPY DIAG:  Acute pain of left knee  Stiffness of left knee, not elsewhere classified  Localized edema  Difficulty in walking, not elsewhere classified  S/P total knee arthroplasty, right  Muscle weakness (generalized)  Rationale for Evaluation and Treatment: Rehabilitation  ONSET DATE: 11/10/23  SUBJECTIVE:   SUBJECTIVE STATEMENT: I did it , I flew to [place] and went to the graduation, I made it, I did take an oxy on Friday and Saturday and I really had swelling and needed to ice at the end of the day  Patient underwent a left TKA on 11/10/23, she reports that she is doing okay, reports that she had noodle leg, reports trying to do the exercises daily  PERTINENT HISTORY: 63 yo female s/p L TKA on 11/10/23. PMH: R TKA, back pain, L3-4, 4-5 fusion in 2023, HTN, DM, C-spine arthroplasty  PAIN:  Are you having pain? Yes: NPRS scale: 5/10 Pain  location: left knee Pain description: ache, sharpe , tender Aggravating factors: moving, walking , bending, I can't straighten the leg pain up to 10/10 Relieving factors: pain meds, ice, rest, elevation at best pain a 7/10  PRECAUTIONS: Other: back surgery in the past  RED FLAGS: None   WEIGHT BEARING RESTRICTIONS: No  FALLS:  Has patient fallen in last 6 months? No  LIVING ENVIRONMENT: Lives with: lives with their family Lives in: House/apartment Stairs: Yes: Internal: 20 steps; can reach both Has following equipment at home: Single point cane, Walker - 2 wheeled, shower chair, and Grab bars  OCCUPATION: volunteers  PLOF: Independent and no device was doing yoga  PATIENT GOALS: walk without device, have no pain, normal ROM, go up and down stairs step over step  NEXT MD VISIT: November 3  OBJECTIVE:  Note: Objective measures were completed at Evaluation unless otherwise noted.  DIAGNOSTIC FINDINGS: none  COGNITION: Overall cognitive status: Within functional limits for tasks assessed     SENSATION: WFL  EDEMA:  Circumferential: right mid patella 44cm, left 49 cm  MUSCLE LENGTH: Not tested  POSTURE: rounded shoulders, forward head, and knee guarded   PALPATION: Very sore and tender, some bruising in the thigh, very red in the medial knee with heat  LOWER EXTREMITY ROM:  Active ROM LEFT AROM 11/13/23 eval Left PROM 11/13/23 eval AROM LEFT 11/24/23 AROM LEft  12/08/23 AROM LEft 12/22/23 AROM Left 01/01/24  Hip flexion        Hip extension        Hip abduction        Hip adduction        Hip internal rotation        Hip external rotation        Knee flexion 80 85 95 102 active Passive to 110 111 116  Knee extension 25 20 20 15 7 5   Ankle dorsiflexion        Ankle plantarflexion        Ankle inversion        Ankle eversion         (Blank rows = not tested)  LOWER EXTREMITY MMT:  MMT Right eval Left eval  Hip flexion    Hip extension     Hip abduction    Hip adduction    Hip internal rotation    Hip external rotation    Knee flexion    Knee extension    Ankle dorsiflexion    Ankle plantarflexion    Ankle inversion    Ankle eversion     (Blank rows = not tested)  FUNCTIONAL TESTS:  5 times sit to stand: 35 seconds Timed up and go (TUG): 56 seconds with FWW  GAIT: Distance walked: 60 feet Assistive device utilized: [employer] -  2 wheeled Level of assistance: CGA Comments: step to pattern, shortened stance phase on the left                                                                                                                                 TREATMENT DATE:  01/05/24 Bike level 4 x 7 minutes 40# resisted gait all directions 40# leg press 2 x 10, then 20# single legs 35# leg curls 5# leg ext 4 block more square step overs Elliptical x 2 minutes Feet on ball K2C, rotation, bridge, isometric abs Passive stretch flexion and extension Vaso medium pressure  01/01/24 Bike seat #5 and then moved to 4 level 4 x 7 minutes 25# leg curls 5# leg extension 20# leg press Passive stretch to the left leg flexion and extension STM to the left LE and the back Vaso to the left leg high pressure 34 degrees  12/31/23 Gait around the back building, then the parking [place] and then to the front door to simulate walking in airport Bike Level 5 x 6 minutes LEg curls 25# 2 x 12 5# leg extension 40# resisted gait all directions 8 toe clears Feet on ball K2C, rotation Passive stretch left knee Vaso left knee medium pressure 34 degrees  12/29/23 Bike level 5 x 6 minutes 25# HS curls 5# extension Leg press 20# then no weight as low as you can go 3 block more square step overs 40# resisted gait all directions Passive stretch into flexion On airex reaching with foot to colors Vaso medium pressure in elevation 34 degrees  12/25/23 Bike seat #5 , level 5 x 6 minutes More square 3 blocks step overs 25# HS  curls 5# leg extension Bosu upside down  On airex toe touches on colored cones called out by PT Leg press 20# then working on flexion Passive stretch to the left knee Vaso medium pressure 34 degrees in elevation  12/24/23 Bike level 4 seat # 5 x 5 minutes Gait outside Stairs step over step On bosu upside down reaching More square 3 blocks step over 8 step ups alternating feet 25# HS curls 5# extension Passive stretch Feet on ball bridge, isometric abs Vaso high pressure 34 degrees  12/22/23 Bike level 4 x 6 minutes seat #7 25# HS curls On airex cone toe touches On airex ball toss On airex eyes closed , head turns and nods Slant board stretch Leg press 20# 2x10 LAQ 5# PROM extension and flexion, scar and joint mobs Vaso in elevation 34 degrees medium pressure  12/17/23 Walk outdoors 1 big lap  Bike seat  L2x L knee PROM w/ end range holds  Patella mobs  Tibiofemoral jt mobs Leg ext 5# 2x10 HS curls 25# 2x10 Step ups 6 Vaso medium pressure 34d    12/15/23 Bike position #8 x 7 minutes 6in forward and lateral step ups x10 each  Heel raised black bar 2x10 Leg press 40lb  2x15 20lb resisted side steps over WaTE x5  L knee PROM w/ end rnage holds  Patella mobs  Tibiofemoral jt mobs Vaso medium pressure 34 degrees  12/11/23 Bike position #8 x 6 minutes Stairs step over step up and down More square 3 blocks step overs CGA Outside gait curbs and uneven terrain Leg press 40# x 10. Then 20# working on flexion 25# HS curls Passive stretch flexion and extension Scar and patellar mobs Vaso medium pressure 34 degrees   12/08/23 Bike full revs position #8 x 6 minutes Stairs step over step Tmill pushes 25# HS curls 5# Leg extension LEg press 20# both legs working on flexion Leg press no weight with the left only Passive stretch flexion and extension Feet on ball K2C, rotation, bridge, isometric abs Vaso medium pressure 34 degrees in  elevation  12/04/23 Nustep level 5 x 5 minutes Bike level 3 x 5 minutes seat position 8 with some trunk lean Walk to other building with SPC then stairs step over step a lot of verbal and tactile cues Leg press 20#  Gait with cues for bending at toe off Passive stretch extension and flexion Vaso 34 degrees in elevation  12/03/23 Nustep level 5 x 6 minutes Bike level 2 x 4 minutes Leg curls 20# 2x15 Leg Ext 5lb 2x10 LLE PROM w end range holds   Patellar mobs  Sit to stands 2x10 4in step up x10  6in x 3 each Resisted gait 30lb 4 way x 3 each  Vaso medium pressure 34 degrees  PATIENT EDUCATION:  Education details: reviewed RICE, gait, HEP Person educated: Patient Education method: Explanation, Demonstration, Tactile cues, Verbal cues, and Handouts Education comprehension: verbalized understanding, returned demonstration, verbal cues required, and tactile cues required  HOME EXERCISE PROGRAM: QS, chair scoots for flexion, ankle pumps, SLR, LAQ  ASSESSMENT:  CLINICAL IMPRESSION: Patient travelled and reports that she made it through the airports without much difficulty, she does report needing to take an oxy the days she was gone, as well as she would get very swollen and had to ice at the end of the days.  She is a little more stiff today and a little more painful with stretches. Pt will benefit from continues skilled PT service to increase L knee strength and ROM to improve her functional capacity.  Patient is a 63 y.o. female who was seen today for physical therapy evaluation and treatment for s/p left TKA on 11/10/23.  She is in a lot pain, has significant tenderness, a lot of redness and warmth in the medial knee.  Reports that she cannot raise the foot up without help but with encouragement she could, was able to do a SAQ.  TUG time was 56 seconds.   Has a lot of edema.   OBJECTIVE IMPAIRMENTS: Abnormal gait, cardiopulmonary status limiting activity, decreased activity  tolerance, decreased balance, decreased coordination, decreased endurance, decreased mobility, difficulty walking, decreased ROM, decreased strength, increased edema, increased muscle spasms, impaired flexibility, and pain.    REHAB POTENTIAL: Good  CLINICAL DECISION MAKING: Evolving/moderate complexity  EVALUATION COMPLEXITY: Moderate   GOALS: Goals reviewed with patient? Yes  SHORT TERM GOALS: Target date: 12/06/23 Independent with initial HEP Baseline: Goal status: met 11/19/23  LONG TERM GOALS: Target date: 04/12/24  Independent with advanced HEP Baseline:  Goal status: INITIAL  2.  Walk with LRAD all distances with minimal deviations Baseline:  Goal status: met 01/01/24  3.  Increase AROM of the left knee to 0-120 degrees flexion  Baseline:  Goal status: progressing 12/22/23  4.  Decrease pain 50% Baseline:  Goal status: progressing 12/22/23  5.  Decrease TUG time to 18 seconds Baseline:  Goal status: met 12/22/23  6.  Go up and down stairs step over step Baseline:  Goal status: met 01/01/24  PLAN:  PT FREQUENCY: 3x/week to start and then will decrease as she improves  PT DURATION: 12 weeks  PLANNED INTERVENTIONS: 97164- PT Re-evaluation, 97110-Therapeutic exercises, 97530- Therapeutic activity, 97112- Neuromuscular re-education, 97535- Self Care, [id number]- Manual therapy, [phone]- Gait training, [phone]- Electrical stimulation (unattended), 97016- Vasopneumatic device, Patient/Family education, Balance training, Stair training, Joint mobilization, and Cryotherapy  PLAN FOR NEXT SESSION: see how she does with her trip   OBADIAH OZELL ORN, PT 01/05/2024, 3:35 PM

## 2024-01-07 ENCOUNTER — Encounter: Payer: Self-pay | Admitting: Physical Therapy

## 2024-01-07 ENCOUNTER — Ambulatory Visit: Admitting: Physical Therapy

## 2024-01-07 ENCOUNTER — Ambulatory Visit: Admitting: Professional Counselor

## 2024-01-07 DIAGNOSIS — R262 Difficulty in walking, not elsewhere classified: Secondary | ICD-10-CM

## 2024-01-07 DIAGNOSIS — R600 Localized edema: Secondary | ICD-10-CM

## 2024-01-07 DIAGNOSIS — M25562 Pain in left knee: Secondary | ICD-10-CM

## 2024-01-07 DIAGNOSIS — M25662 Stiffness of left knee, not elsewhere classified: Secondary | ICD-10-CM

## 2024-01-07 DIAGNOSIS — M6281 Muscle weakness (generalized): Secondary | ICD-10-CM

## 2024-01-07 DIAGNOSIS — Z96651 Presence of right artificial knee joint: Secondary | ICD-10-CM

## 2024-01-07 NOTE — Therapy (Signed)
 OUTPATIENT PHYSICAL THERAPY LOWER EXTREMITY TREATMENT   Patient Name: [name] MRN: [id number] DOB:05/22/1960, 63 y.o., female Today's Date: 01/07/2024  END OF SESSION:  PT End of Session - 01/07/24 1432     Visit Number 22    Date for Recertification  02/13/24    PT Start Time 1432    PT Stop Time 1515    PT Time Calculation (min) 43 min    Activity Tolerance Patient tolerated treatment well    Behavior During Therapy WFL for tasks assessed/performed           Past Medical History:  Diagnosis Date   Anxiety    Arthritis    Back pain of lumbar region with sciatica    Cancer (HCC)    breast   Depression    Family history of anesthesia complication    grandfather died under anesthesia 70's- had heart issues   H/O carpal tunnel repair 2014   Headache(784.0)    Hyperlipidemia    Hypertension    Hypothyroidism    Medial meniscus tear    PONV (postoperative nausea and vomiting)    Pre-diabetes    Sleep apnea    cpap since 10   Past Surgical History:  Procedure Laterality Date   BILATERAL TOTAL MASTECTOMY WITH AXILLARY LYMPH NODE DISSECTION     BREAST RECONSTRUCTION Left 07/17/2023   for encapsulation,  done at [facility]   CERVICAL DISC ARTHROPLASTY N/A 03/19/2013   Procedure: CERVICAL FIVE TO SIX, CERVICAL SIX TO SEVEN CERVICAL ANTERIOR DISC ARTHROPLASTY;  Surgeon: Victory Gens, MD;  Location: [facility];  Service: Neurosurgery;  Laterality: N/A;  C56 C67 artificial disc replacement   CESAREAN SECTION     COLONOSCOPY     DILATATION & CURETTAGE/HYSTEROSCOPY WITH TRUECLEAR N/A 11/25/2013   Procedure: DILATATION & CURETTAGE/HYSTEROSCOPY WITH TRUCLEAR;  Surgeon: Charlie CHRISTELLA Croak, MD;  Location: [facility];  Service: Gynecology;  Laterality: N/A;   ECTOPIC PREGNANCY SURGERY     GALLBLADDER SURGERY     MOUTH SURGERY     child- dog bite   SPINAL FUSION     2023   STERIOD INJECTION Left 09/09/2022   Procedure: LEFT KNEE STEROID INJECTION;  Surgeon: Melodi Lerner,  MD;  Location: [facility];  Service: Orthopedics;  Laterality: Left;  left knee injection 0928   TONSILLECTOMY     TOTAL KNEE ARTHROPLASTY Right 09/09/2022   Procedure: TOTAL KNEE ARTHROPLASTY;  Surgeon: Melodi Lerner, MD;  Location: [facility];  Service: Orthopedics;  Laterality: Right;   TOTAL KNEE ARTHROPLASTY Left 11/10/2023   Procedure: ARTHROPLASTY, KNEE, TOTAL;  Surgeon: Melodi Lerner, MD;  Location: [facility];  Service: Orthopedics;  Laterality: Left;   TUBAL LIGATION     Patient Active Problem List   Diagnosis Date Noted   Primary osteoarthritis of left knee 11/10/2023   OA (osteoarthritis) of knee 09/09/2022   Exertional dyspnea 04/17/2022   Elevated coronary artery calcium  score 04/17/2022   Spondylolisthesis at L4-L5 level 12/27/2021   Ductal carcinoma in situ (DCIS) of left breast 01/19/2018   Generalized anxiety disorder 11/07/2017   Depression 11/07/2017   Insomnia 11/07/2017   Cervical spondylosis 03/19/2013    PCP: Onita, MD  REFERRING PROVIDER: Bethanie, PA  REFERRING DIAG: s/p left TKA  THERAPY DIAG:  Acute pain of left knee  Stiffness of left knee, not elsewhere classified  Localized edema  Difficulty in walking, not elsewhere classified  S/P total knee arthroplasty, right  Muscle weakness (generalized)  Rationale for Evaluation and Treatment: Rehabilitation  ONSET DATE: 11/10/23  SUBJECTIVE:   SUBJECTIVE STATEMENT: Good, I'm pretty tired  Patient underwent a left TKA on 11/10/23, she reports that she is doing okay, reports that she had noodle leg, reports trying to do the exercises daily  PERTINENT HISTORY: 63 yo female s/p L TKA on 11/10/23. PMH: R TKA, back pain, L3-4, 4-5 fusion in 2023, HTN, DM, C-spine arthroplasty  PAIN:  Are you having pain? Yes: NPRS scale: 5/10 Pain location: left knee Pain description: ache, sharpe , tender Aggravating factors: moving, walking , bending, I can't straighten the leg pain up to 10/10 Relieving factors:  pain meds, ice, rest, elevation at best pain a 7/10  PRECAUTIONS: Other: back surgery in the past  RED FLAGS: None   WEIGHT BEARING RESTRICTIONS: No  FALLS:  Has patient fallen in last 6 months? No  LIVING ENVIRONMENT: Lives with: lives with their family Lives in: House/apartment Stairs: Yes: Internal: 20 steps; can reach both Has following equipment at home: Single point cane, Walker - 2 wheeled, shower chair, and Grab bars  OCCUPATION: volunteers  PLOF: Independent and no device was doing yoga  PATIENT GOALS: walk without device, have no pain, normal ROM, go up and down stairs step over step  NEXT MD VISIT: November 3  OBJECTIVE:  Note: Objective measures were completed at Evaluation unless otherwise noted.  DIAGNOSTIC FINDINGS: none  COGNITION: Overall cognitive status: Within functional limits for tasks assessed     SENSATION: WFL  EDEMA:  Circumferential: right mid patella 44cm, left 49 cm  MUSCLE LENGTH: Not tested  POSTURE: rounded shoulders, forward head, and knee guarded   PALPATION: Very sore and tender, some bruising in the thigh, very red in the medial knee with heat  LOWER EXTREMITY ROM:  Active ROM LEFT AROM 11/13/23 eval Left PROM 11/13/23 eval AROM LEFT 11/24/23 AROM LEft  12/08/23 AROM LEft 12/22/23 AROM Left 01/01/24  Hip flexion        Hip extension        Hip abduction        Hip adduction        Hip internal rotation        Hip external rotation        Knee flexion 80 85 95 102 active Passive to 110 111 116  Knee extension 25 20 20 15 7 5   Ankle dorsiflexion        Ankle plantarflexion        Ankle inversion        Ankle eversion         (Blank rows = not tested)  LOWER EXTREMITY MMT:  MMT Right eval Left eval  Hip flexion    Hip extension    Hip abduction    Hip adduction    Hip internal rotation    Hip external rotation    Knee flexion    Knee extension    Ankle dorsiflexion    Ankle plantarflexion     Ankle inversion    Ankle eversion     (Blank rows = not tested)  FUNCTIONAL TESTS:  5 times sit to stand: 35 seconds Timed up and go (TUG): 56 seconds with FWW  GAIT: Distance walked: 60 feet Assistive device utilized: Walker - 2 wheeled Level of assistance: CGA Comments: step to pattern, shortened stance phase on the left  TREATMENT DATE:  01/07/24 Bike level 4 x 7 minutes 40# resisted gait all directions 6in step ups x10 8in step ups x10  Elliptical L2.5 x 2 minutes Leg press 50lb 2x10  Passive stretch flexion and extension  Patellar mobs Vaso medium pressure   01/05/24 Bike level 4 x 7 minutes 40# resisted gait all directions 40# leg press 2 x 10, then 20# single legs 35# leg curls 5# leg ext 4 block more square step overs Elliptical x 2 minutes Feet on ball K2C, rotation, bridge, isometric abs Passive stretch flexion and extension Vaso medium pressure  01/01/24 Bike seat #5 and then moved to 4 level 4 x 7 minutes 25# leg curls 5# leg extension 20# leg press Passive stretch to the left leg flexion and extension STM to the left LE and the back Vaso to the left leg high pressure 34 degrees  12/31/23 Gait around the back building, then the parking michaelfurt and then to the front door to simulate walking in airport Bike Level 5 x 6 minutes LEg curls 25# 2 x 12 5# leg extension 40# resisted gait all directions 8 toe clears Feet on ball K2C, rotation Passive stretch left knee Vaso left knee medium pressure 34 degrees  12/29/23 Bike level 5 x 6 minutes 25# HS curls 5# extension Leg press 20# then no weight as low as you can go 3 block more square step overs 40# resisted gait all directions Passive stretch into flexion On airex reaching with foot to colors Vaso medium pressure in elevation 34 degrees  12/25/23 Bike seat #5 ,  level 5 x 6 minutes More square 3 blocks step overs 25# HS curls 5# leg extension Bosu upside down  On airex toe touches on colored cones called out by PT Leg press 20# then working on flexion Passive stretch to the left knee Vaso medium pressure 34 degrees in elevation  12/24/23 Bike level 4 seat # 5 x 5 minutes Gait outside Stairs step over step On bosu upside down reaching More square 3 blocks step over 8 step ups alternating feet 25# HS curls 5# extension Passive stretch Feet on ball bridge, isometric abs Vaso high pressure 34 degrees  12/22/23 Bike level 4 x 6 minutes seat #7 25# HS curls On airex cone toe touches On airex ball toss On airex eyes closed , head turns and nods Slant board stretch Leg press 20# 2x10 LAQ 5# PROM extension and flexion, scar and joint mobs Vaso in elevation 34 degrees medium pressure  12/17/23 Walk outdoors 1 big lap  Bike seat  L2x L knee PROM w/ end range holds  Patella mobs  Tibiofemoral jt mobs Leg ext 5# 2x10 HS curls 25# 2x10 Step ups 6 Vaso medium pressure 34d    12/15/23 Bike position #8 x 7 minutes 6in forward and lateral step ups x10 each  Heel raised black bar 2x10 Leg press 40lb 2x15 20lb resisted side steps over WaTE x5  L knee PROM w/ end rnage holds  Patella mobs  Tibiofemoral jt mobs Vaso medium pressure 34 degrees   PATIENT EDUCATION:  Education details: reviewed RICE, gait, HEP Person educated: Patient Education method: Programmer, Multimedia, Demonstration, Tactile cues, Verbal cues, and Handouts Education comprehension: verbalized understanding, returned demonstration, verbal cues required, and tactile cues required  HOME EXERCISE PROGRAM: QS, chair scoots for flexion, ankle pumps, SLR, LAQ  ASSESSMENT:  CLINICAL IMPRESSION: Pt enters doing well. Good carryover from last session. CGA needed at times with resisted side steps.  Increase resistance tolerated on leg press. Some compensation noted with 8  in step ups. Pt will benefit from continues skilled PT service to increase L knee strength and ROM to improve her functional capacity.  Patient is a 63 y.o. female who was seen today for physical therapy evaluation and treatment for s/p left TKA on 11/10/23.  She is in a lot pain, has significant tenderness, a lot of redness and warmth in the medial knee.  Reports that she cannot raise the foot up without help but with encouragement she could, was able to do a SAQ.  TUG time was 56 seconds.   Has a lot of edema.   OBJECTIVE IMPAIRMENTS: Abnormal gait, cardiopulmonary status limiting activity, decreased activity tolerance, decreased balance, decreased coordination, decreased endurance, decreased mobility, difficulty walking, decreased ROM, decreased strength, increased edema, increased muscle spasms, impaired flexibility, and pain.    REHAB POTENTIAL: Good  CLINICAL DECISION MAKING: Evolving/moderate complexity  EVALUATION COMPLEXITY: Moderate   GOALS: Goals reviewed with patient? Yes  SHORT TERM GOALS: Target date: 12/06/23 Independent with initial HEP Baseline: Goal status: met 11/19/23  LONG TERM GOALS: Target date: 04/12/24  Independent with advanced HEP Baseline:  Goal status: INITIAL  2.  Walk with LRAD all distances with minimal deviations Baseline:  Goal status: met 01/01/24  3.  Increase AROM of the left knee to 0-120 degrees flexion Baseline:  Goal status: progressing 12/22/23  4.  Decrease pain 50% Baseline:  Goal status: progressing 12/22/23  5.  Decrease TUG time to 18 seconds Baseline:  Goal status: met 12/22/23  6.  Go up and down stairs step over step Baseline:  Goal status: met 01/01/24  PLAN:  PT FREQUENCY: 3x/week to start and then will decrease as she improves  PT DURATION: 12 weeks  PLANNED INTERVENTIONS: 97164- PT Re-evaluation, 97110-Therapeutic exercises, 97530- Therapeutic activity, 97112- Neuromuscular re-education, 97535- Self Care, [id number]-  Manual therapy, [phone]- Gait training, [phone]- Electrical stimulation (unattended), 97016- Vasopneumatic device, Patient/Family education, Balance training, Stair training, Joint mobilization, and Cryotherapy  PLAN FOR NEXT SESSION: see how she does with her trip   Tanda KANDICE Sorrow, PTA 01/07/2024, 2:35 PM

## 2024-01-08 ENCOUNTER — Encounter: Payer: Self-pay | Admitting: Physical Therapy

## 2024-01-08 ENCOUNTER — Ambulatory Visit: Admitting: Physical Therapy

## 2024-01-08 DIAGNOSIS — M25562 Pain in left knee: Secondary | ICD-10-CM

## 2024-01-08 DIAGNOSIS — M6281 Muscle weakness (generalized): Secondary | ICD-10-CM

## 2024-01-08 DIAGNOSIS — R262 Difficulty in walking, not elsewhere classified: Secondary | ICD-10-CM

## 2024-01-08 DIAGNOSIS — M25662 Stiffness of left knee, not elsewhere classified: Secondary | ICD-10-CM

## 2024-01-08 DIAGNOSIS — Z96651 Presence of right artificial knee joint: Secondary | ICD-10-CM

## 2024-01-08 DIAGNOSIS — R600 Localized edema: Secondary | ICD-10-CM

## 2024-01-08 NOTE — Therapy (Signed)
 OUTPATIENT PHYSICAL THERAPY LOWER EXTREMITY TREATMENT   Patient Name: [name] MRN: [id number] DOB:Jun 17, 1960, 63 y.o., female Today's Date: 01/08/2024  END OF SESSION:  PT End of Session - 01/08/24 1532     Visit Number 23    Date for Recertification  02/13/24    Authorization Type [employer]    PT Start Time 1528    PT Stop Time 1626    PT Time Calculation (min) 58 min    Activity Tolerance Patient tolerated treatment well    Behavior During Therapy WFL for tasks assessed/performed           Past Medical History:  Diagnosis Date   Anxiety    Arthritis    Back pain of lumbar region with sciatica    Cancer (HCC)    breast   Depression    Family history of anesthesia complication    grandfather died under anesthesia 70's- had heart issues   H/O carpal tunnel repair 2014   Headache(784.0)    Hyperlipidemia    Hypertension    Hypothyroidism    Medial meniscus tear    PONV (postoperative nausea and vomiting)    Pre-diabetes    Sleep apnea    cpap since 10   Past Surgical History:  Procedure Laterality Date   BILATERAL TOTAL MASTECTOMY WITH AXILLARY LYMPH NODE DISSECTION     BREAST RECONSTRUCTION Left 07/17/2023   for encapsulation,  done at [facility]   CERVICAL DISC ARTHROPLASTY N/A 03/19/2013   Procedure: CERVICAL FIVE TO SIX, CERVICAL SIX TO SEVEN CERVICAL ANTERIOR DISC ARTHROPLASTY;  Surgeon: Victory Gens, MD;  Location: [facility];  Service: Neurosurgery;  Laterality: N/A;  C56 C67 artificial disc replacement   CESAREAN SECTION     COLONOSCOPY     DILATATION & CURETTAGE/HYSTEROSCOPY WITH TRUECLEAR N/A 11/25/2013   Procedure: DILATATION & CURETTAGE/HYSTEROSCOPY WITH TRUCLEAR;  Surgeon: Charlie CHRISTELLA Croak, MD;  Location: [facility];  Service: Gynecology;  Laterality: N/A;   ECTOPIC PREGNANCY SURGERY     GALLBLADDER SURGERY     MOUTH SURGERY     child- dog bite   SPINAL FUSION     2023   STERIOD INJECTION Left 09/09/2022   Procedure: LEFT KNEE STEROID INJECTION;   Surgeon: Melodi Lerner, MD;  Location: [facility];  Service: Orthopedics;  Laterality: Left;  left knee injection 0928   TONSILLECTOMY     TOTAL KNEE ARTHROPLASTY Right 09/09/2022   Procedure: TOTAL KNEE ARTHROPLASTY;  Surgeon: Melodi Lerner, MD;  Location: [facility];  Service: Orthopedics;  Laterality: Right;   TOTAL KNEE ARTHROPLASTY Left 11/10/2023   Procedure: ARTHROPLASTY, KNEE, TOTAL;  Surgeon: Melodi Lerner, MD;  Location: [facility];  Service: Orthopedics;  Laterality: Left;   TUBAL LIGATION     Patient Active Problem List   Diagnosis Date Noted   Primary osteoarthritis of left knee 11/10/2023   OA (osteoarthritis) of knee 09/09/2022   Exertional dyspnea 04/17/2022   Elevated coronary artery calcium  score 04/17/2022   Spondylolisthesis at L4-L5 level 12/27/2021   Ductal carcinoma in situ (DCIS) of left breast 01/19/2018   Generalized anxiety disorder 11/07/2017   Depression 11/07/2017   Insomnia 11/07/2017   Cervical spondylosis 03/19/2013    PCP: Onita, MD  REFERRING PROVIDER: Bethanie, PA  REFERRING DIAG: s/p left TKA  THERAPY DIAG:  Acute pain of left knee  Stiffness of left knee, not elsewhere classified  Localized edema  Difficulty in walking, not elsewhere classified  S/P total knee arthroplasty, right  Muscle weakness (generalized)  Rationale for Evaluation and Treatment: Rehabilitation  ONSET DATE: 11/10/23  SUBJECTIVE:   SUBJECTIVE STATEMENT: Doing okay  Patient underwent a left TKA on 11/10/23, she reports that she is doing okay, reports that she had noodle leg, reports trying to do the exercises daily  PERTINENT HISTORY: 63 yo female s/p L TKA on 11/10/23. PMH: R TKA, back pain, L3-4, 4-5 fusion in 2023, HTN, DM, C-spine arthroplasty  PAIN:  Are you having pain? Yes: NPRS scale: 5/10 Pain location: left knee Pain description: ache, sharpe , tender Aggravating factors: moving, walking , bending, I can't straighten the leg pain up to  10/10 Relieving factors: pain meds, ice, rest, elevation at best pain a 7/10  PRECAUTIONS: Other: back surgery in the past  RED FLAGS: None   WEIGHT BEARING RESTRICTIONS: No  FALLS:  Has patient fallen in last 6 months? No  LIVING ENVIRONMENT: Lives with: lives with their family Lives in: House/apartment Stairs: Yes: Internal: 20 steps; can reach both Has following equipment at home: Single point cane, Walker - 2 wheeled, shower chair, and Grab bars  OCCUPATION: volunteers  PLOF: Independent and no device was doing yoga  PATIENT GOALS: walk without device, have no pain, normal ROM, go up and down stairs step over step  NEXT MD VISIT: November 3  OBJECTIVE:  Note: Objective measures were completed at Evaluation unless otherwise noted.  DIAGNOSTIC FINDINGS: none  COGNITION: Overall cognitive status: Within functional limits for tasks assessed     SENSATION: WFL  EDEMA:  Circumferential: right mid patella 44cm, left 49 cm  MUSCLE LENGTH: Not tested  POSTURE: rounded shoulders, forward head, and knee guarded   PALPATION: Very sore and tender, some bruising in the thigh, very red in the medial knee with heat  LOWER EXTREMITY ROM:  Active ROM LEFT AROM 11/13/23 eval Left PROM 11/13/23 eval AROM LEFT 11/24/23 AROM LEft  12/08/23 AROM LEft 12/22/23 AROM Left 01/01/24  Hip flexion        Hip extension        Hip abduction        Hip adduction        Hip internal rotation        Hip external rotation        Knee flexion 80 85 95 102 active Passive to 110 111 116  Knee extension 25 20 20 15 7 5   Ankle dorsiflexion        Ankle plantarflexion        Ankle inversion        Ankle eversion         (Blank rows = not tested)  LOWER EXTREMITY MMT:  MMT Right eval Left eval  Hip flexion    Hip extension    Hip abduction    Hip adduction    Hip internal rotation    Hip external rotation    Knee flexion    Knee extension    Ankle dorsiflexion     Ankle plantarflexion    Ankle inversion    Ankle eversion     (Blank rows = not tested)  FUNCTIONAL TESTS:  5 times sit to stand: 35 seconds Timed up and go (TUG): 56 seconds with FWW  GAIT: Distance walked: 60 feet Assistive device utilized: Walker - 2 wheeled Level of assistance: CGA Comments: step to pattern, shortened stance phase on the left  TREATMENT DATE:  01/08/24 Bike level 5  seat #5 6 minutes Elliptical Level 4 x 3 minutes 35# leg curls 2x10 5# leg ext 50# resisted gait 6 side step ups Leg press 30# On upside down bosu ball toss Feet on ball K2C, rotation, bridge, isometric abs Passive stretch left knee flexion and extension Vaso 34 degrees left knee in elevation  01/07/24 Bike level 4 x 7 minutes 40# resisted gait all directions 6in step ups x10 8in step ups x10  Elliptical L2.5 x 2 minutes Leg press 50lb 2x10  Passive stretch flexion and extension  Patellar mobs Vaso medium pressure   01/05/24 Bike level 4 x 7 minutes 40# resisted gait all directions 40# leg press 2 x 10, then 20# single legs 35# leg curls 5# leg ext 4 block more square step overs Elliptical x 2 minutes Feet on ball K2C, rotation, bridge, isometric abs Passive stretch flexion and extension Vaso medium pressure  01/01/24 Bike seat #5 and then moved to 4 level 4 x 7 minutes 25# leg curls 5# leg extension 20# leg press Passive stretch to the left leg flexion and extension STM to the left LE and the back Vaso to the left leg high pressure 34 degrees  12/31/23 Gait around the back building, then the parking michaelfurt and then to the front door to simulate walking in airport Bike Level 5 x 6 minutes LEg curls 25# 2 x 12 5# leg extension 40# resisted gait all directions 8 toe clears Feet on ball K2C, rotation Passive stretch left knee Vaso left  knee medium pressure 34 degrees  12/29/23 Bike level 5 x 6 minutes 25# HS curls 5# extension Leg press 20# then no weight as low as you can go 3 block more square step overs 40# resisted gait all directions Passive stretch into flexion On airex reaching with foot to colors Vaso medium pressure in elevation 34 degrees  12/25/23 Bike seat #5 , level 5 x 6 minutes More square 3 blocks step overs 25# HS curls 5# leg extension Bosu upside down  On airex toe touches on colored cones called out by PT Leg press 20# then working on flexion Passive stretch to the left knee Vaso medium pressure 34 degrees in elevation  12/24/23 Bike level 4 seat # 5 x 5 minutes Gait outside Stairs step over step On bosu upside down reaching More square 3 blocks step over 8 step ups alternating feet 25# HS curls 5# extension Passive stretch Feet on ball bridge, isometric abs Vaso high pressure 34 degrees  12/22/23 Bike level 4 x 6 minutes seat #7 25# HS curls On airex cone toe touches On airex ball toss On airex eyes closed , head turns and nods Slant board stretch Leg press 20# 2x10 LAQ 5# PROM extension and flexion, scar and joint mobs Vaso in elevation 34 degrees medium pressure  12/17/23 Walk outdoors 1 big lap  Bike seat  L2x L knee PROM w/ end range holds  Patella mobs  Tibiofemoral jt mobs Leg ext 5# 2x10 HS curls 25# 2x10 Step ups 6 Vaso medium pressure 34d    12/15/23 Bike position #8 x 7 minutes 6in forward and lateral step ups x10 each  Heel raised black bar 2x10 Leg press 40lb 2x15 20lb resisted side steps over WaTE x5  L knee PROM w/ end rnage holds  Patella mobs  Tibiofemoral jt mobs Vaso medium pressure 34 degrees   PATIENT EDUCATION:  Education details: reviewed RICE, gait, HEP  Person educated: Patient Education method: Explanation, Demonstration, Tactile cues, Verbal cues, and Handouts Education comprehension: verbalized understanding, returned  demonstration, verbal cues required, and tactile cues required  HOME EXERCISE PROGRAM: QS, chair scoots for flexion, ankle pumps, SLR, LAQ  ASSESSMENT:  CLINICAL IMPRESSION: Pt doing well, even after travelling last weekend.   Some compensation noted with 8 in step ups. She is a little tight and sore with both flexion and extension today.  Increased weight on the resisted walkingPt will benefit from continues skilled PT service to increase L knee strength and ROM to improve her functional capacity.  Patient is a 63 y.o. female who was seen today for physical therapy evaluation and treatment for s/p left TKA on 11/10/23.  She is in a lot pain, has significant tenderness, a lot of redness and warmth in the medial knee.  Reports that she cannot raise the foot up without help but with encouragement she could, was able to do a SAQ.  TUG time was 56 seconds.   Has a lot of edema.   OBJECTIVE IMPAIRMENTS: Abnormal gait, cardiopulmonary status limiting activity, decreased activity tolerance, decreased balance, decreased coordination, decreased endurance, decreased mobility, difficulty walking, decreased ROM, decreased strength, increased edema, increased muscle spasms, impaired flexibility, and pain.    REHAB POTENTIAL: Good  CLINICAL DECISION MAKING: Evolving/moderate complexity  EVALUATION COMPLEXITY: Moderate   GOALS: Goals reviewed with patient? Yes  SHORT TERM GOALS: Target date: 12/06/23 Independent with initial HEP Baseline: Goal status: met 11/19/23  LONG TERM GOALS: Target date: 04/12/24  Independent with advanced HEP Baseline:  Goal status: INITIAL  2.  Walk with LRAD all distances with minimal deviations Baseline:  Goal status: met 01/01/24  3.  Increase AROM of the left knee to 0-120 degrees flexion Baseline:  Goal status: progressing 01/08/24  4.  Decrease pain 50% Baseline:  Goal status: progressing 01/08/24  5.  Decrease TUG time to 18 seconds Baseline:  Goal  status: met 12/22/23  6.  Go up and down stairs step over step Baseline:  Goal status: met 01/01/24  PLAN:  PT FREQUENCY: 3x/week to start and then will decrease as she improves  PT DURATION: 12 weeks  PLANNED INTERVENTIONS: 97164- PT Re-evaluation, 97110-Therapeutic exercises, 97530- Therapeutic activity, 97112- Neuromuscular re-education, 97535- Self Care, [id number]- Manual therapy, [phone]- Gait training, [phone]- Electrical stimulation (unattended), 97016- Vasopneumatic device, Patient/Family education, Balance training, Stair training, Joint mobilization, and Cryotherapy  PLAN FOR NEXT SESSION: see how she does with her trip   OBADIAH OZELL ORN, PT 01/08/2024, 3:33 PM

## 2024-01-12 ENCOUNTER — Ambulatory Visit: Admitting: Physical Therapy

## 2024-01-12 DIAGNOSIS — M25662 Stiffness of left knee, not elsewhere classified: Secondary | ICD-10-CM

## 2024-01-12 DIAGNOSIS — R600 Localized edema: Secondary | ICD-10-CM

## 2024-01-12 DIAGNOSIS — M25562 Pain in left knee: Secondary | ICD-10-CM | POA: Diagnosis not present

## 2024-01-12 DIAGNOSIS — Z96651 Presence of right artificial knee joint: Secondary | ICD-10-CM

## 2024-01-12 DIAGNOSIS — M6281 Muscle weakness (generalized): Secondary | ICD-10-CM

## 2024-01-12 DIAGNOSIS — R262 Difficulty in walking, not elsewhere classified: Secondary | ICD-10-CM

## 2024-01-12 NOTE — Therapy (Signed)
 " OUTPATIENT PHYSICAL THERAPY LOWER EXTREMITY TREATMENT   Patient Name: [name] MRN: [id number] DOB:August 30, 1960, 63 y.o., female Today's Date: 01/12/2024  END OF SESSION:  PT End of Session - 01/12/24 0849     Visit Number 24    Date for Recertification  02/13/24    PT Start Time 0849    PT Stop Time 0945    PT Time Calculation (min) 56 min    Activity Tolerance Patient tolerated treatment well    Behavior During Therapy [facility] for tasks assessed/performed           Past Medical History:  Diagnosis Date   Anxiety    Arthritis    Back pain of lumbar region with sciatica    Cancer (HCC)    breast   Depression    Family history of anesthesia complication    grandfather died under anesthesia 70's- had heart issues   H/O carpal tunnel repair 2014   Headache(784.0)    Hyperlipidemia    Hypertension    Hypothyroidism    Medial meniscus tear    PONV (postoperative nausea and vomiting)    Pre-diabetes    Sleep apnea    cpap since 10   Past Surgical History:  Procedure Laterality Date   BILATERAL TOTAL MASTECTOMY WITH AXILLARY LYMPH NODE DISSECTION     BREAST RECONSTRUCTION Left 07/17/2023   for encapsulation,  done at [facility]   CERVICAL DISC ARTHROPLASTY N/A 03/19/2013   Procedure: CERVICAL FIVE TO SIX, CERVICAL SIX TO SEVEN CERVICAL ANTERIOR DISC ARTHROPLASTY;  Surgeon: Victory Gens, MD;  Location: [facility];  Service: Neurosurgery;  Laterality: N/A;  C56 C67 artificial disc replacement   CESAREAN SECTION     COLONOSCOPY     DILATATION & CURETTAGE/HYSTEROSCOPY WITH TRUECLEAR N/A 11/25/2013   Procedure: DILATATION & CURETTAGE/HYSTEROSCOPY WITH TRUCLEAR;  Surgeon: Charlie CHRISTELLA Croak, MD;  Location: [facility];  Service: Gynecology;  Laterality: N/A;   ECTOPIC PREGNANCY SURGERY     GALLBLADDER SURGERY     MOUTH SURGERY     child- dog bite   SPINAL FUSION     2023   STERIOD INJECTION Left 09/09/2022   Procedure: LEFT KNEE STEROID INJECTION;  Surgeon: Melodi Lerner,  MD;  Location: [facility];  Service: Orthopedics;  Laterality: Left;  left knee injection 0928   TONSILLECTOMY     TOTAL KNEE ARTHROPLASTY Right 09/09/2022   Procedure: TOTAL KNEE ARTHROPLASTY;  Surgeon: Melodi Lerner, MD;  Location: [facility];  Service: Orthopedics;  Laterality: Right;   TOTAL KNEE ARTHROPLASTY Left 11/10/2023   Procedure: ARTHROPLASTY, KNEE, TOTAL;  Surgeon: Melodi Lerner, MD;  Location: [facility];  Service: Orthopedics;  Laterality: Left;   TUBAL LIGATION     Patient Active Problem List   Diagnosis Date Noted   Primary osteoarthritis of left knee 11/10/2023   OA (osteoarthritis) of knee 09/09/2022   Exertional dyspnea 04/17/2022   Elevated coronary artery calcium  score 04/17/2022   Spondylolisthesis at L4-L5 level 12/27/2021   Ductal carcinoma in situ (DCIS) of left breast 01/19/2018   Generalized anxiety disorder 11/07/2017   Depression 11/07/2017   Insomnia 11/07/2017   Cervical spondylosis 03/19/2013    PCP: Onita, MD  REFERRING PROVIDER: Bethanie, PA  REFERRING DIAG: s/p left TKA  THERAPY DIAG:  Acute pain of left knee  Stiffness of left knee, not elsewhere classified  Localized edema  Difficulty in walking, not elsewhere classified  S/P total knee arthroplasty, right  Muscle weakness (generalized)  Rationale for Evaluation and Treatment:  Rehabilitation  ONSET DATE: 11/10/23  SUBJECTIVE:   SUBJECTIVE STATEMENT: Tired but ok  Patient underwent a left TKA on 11/10/23, she reports that she is doing okay, reports that she had noodle leg, reports trying to do the exercises daily  PERTINENT HISTORY: 63 yo female s/p L TKA on 11/10/23. PMH: R TKA, back pain, L3-4, 4-5 fusion in 2023, HTN, DM, C-spine arthroplasty  PAIN:  Are you having pain? Yes: NPRS scale: 4/10 Pain location: left knee Pain description: ache, sharpe , tender Aggravating factors: moving, walking , bending, I can't straighten the leg pain up to 10/10 Relieving factors: pain meds,  ice, rest, elevation at best pain a 7/10  PRECAUTIONS: Other: back surgery in the past  RED FLAGS: None   WEIGHT BEARING RESTRICTIONS: No  FALLS:  Has patient fallen in last 6 months? No  LIVING ENVIRONMENT: Lives with: lives with their family Lives in: House/apartment Stairs: Yes: Internal: 20 steps; can reach both Has following equipment at home: Single point cane, Walker - 2 wheeled, shower chair, and Grab bars  OCCUPATION: volunteers  PLOF: Independent and no device was doing yoga  PATIENT GOALS: walk without device, have no pain, normal ROM, go up and down stairs step over step  NEXT MD VISIT: November 3  OBJECTIVE:  Note: Objective measures were completed at Evaluation unless otherwise noted.  DIAGNOSTIC FINDINGS: none  COGNITION: Overall cognitive status: Within functional limits for tasks assessed     SENSATION: WFL  EDEMA:  Circumferential: right mid patella 44cm, left 49 cm  MUSCLE LENGTH: Not tested  POSTURE: rounded shoulders, forward head, and knee guarded   PALPATION: Very sore and tender, some bruising in the thigh, very red in the medial knee with heat  LOWER EXTREMITY ROM:  Active ROM LEFT AROM 11/13/23 eval Left PROM 11/13/23 eval AROM LEFT 11/24/23 AROM LEft  12/08/23 AROM LEft 12/22/23 AROM Left 01/01/24  Hip flexion        Hip extension        Hip abduction        Hip adduction        Hip internal rotation        Hip external rotation        Knee flexion 80 85 95 102 active Passive to 110 111 116  Knee extension 25 20 20 15 7 5   Ankle dorsiflexion        Ankle plantarflexion        Ankle inversion        Ankle eversion         (Blank rows = not tested)  LOWER EXTREMITY MMT:  MMT Right eval Left eval  Hip flexion    Hip extension    Hip abduction    Hip adduction    Hip internal rotation    Hip external rotation    Knee flexion    Knee extension    Ankle dorsiflexion    Ankle plantarflexion    Ankle  inversion    Ankle eversion     (Blank rows = not tested)  FUNCTIONAL TESTS:  5 times sit to stand: 35 seconds Timed up and go (TUG): 56 seconds with FWW  GAIT: Distance walked: 60 feet Assistive device utilized: Walker - 2 wheeled Level of assistance: CGA Comments: step to pattern, shortened stance phase on the left  TREATMENT DATE:  01/12/24 Bike level 5  seat #5 6 minutes Elliptical Level 4 x 3 minutes 6in step ups x10 8in step ups x10  6in lateral step ups x10 each Leg press 40lb 2x12 S2S holding yellow ball on airex 2x10 HS vurls 35lb 2x10 Leg Ext 10lb 2x10 Vaso 34 degrees left knee in elevation  01/08/24 Bike level 5  seat #5 6 minutes Elliptical Level 4 x 3 minutes 35# leg curls 2x10 5# leg ext 50# resisted gait 6 side step ups Leg press 30# On upside down bosu ball toss Feet on ball K2C, rotation, bridge, isometric abs Passive stretch left knee flexion and extension Vaso 34 degrees left knee in elevation  01/07/24 Bike level 4 x 7 minutes 40# resisted gait all directions 6in step ups x10 8in step ups x10  Elliptical L2.5 x 2 minutes Leg press 50lb 2x10  Passive stretch flexion and extension  Patellar mobs Vaso medium pressure   01/05/24 Bike level 4 x 7 minutes 40# resisted gait all directions 40# leg press 2 x 10, then 20# single legs 35# leg curls 5# leg ext 4 block more square step overs Elliptical x 2 minutes Feet on ball K2C, rotation, bridge, isometric abs Passive stretch flexion and extension Vaso medium pressure  01/01/24 Bike seat #5 and then moved to 4 level 4 x 7 minutes 25# leg curls 5# leg extension 20# leg press Passive stretch to the left leg flexion and extension STM to the left LE and the back Vaso to the left leg high pressure 34 degrees  12/31/23 Gait around the back building, then the  parking [place] and then to the front door to simulate walking in airport Bike Level 5 x 6 minutes LEg curls 25# 2 x 12 5# leg extension 40# resisted gait all directions 8 toe clears Feet on ball K2C, rotation Passive stretch left knee Vaso left knee medium pressure 34 degrees  12/29/23 Bike level 5 x 6 minutes 25# HS curls 5# extension Leg press 20# then no weight as low as you can go 3 block more square step overs 40# resisted gait all directions Passive stretch into flexion On airex reaching with foot to colors Vaso medium pressure in elevation 34 degrees  12/25/23 Bike seat #5 , level 5 x 6 minutes More square 3 blocks step overs 25# HS curls 5# leg extension Bosu upside down  On airex toe touches on colored cones called out by PT Leg press 20# then working on flexion Passive stretch to the left knee Vaso medium pressure 34 degrees in elevation  12/24/23 Bike level 4 seat # 5 x 5 minutes Gait outside Stairs step over step On bosu upside down reaching More square 3 blocks step over 8 step ups alternating feet 25# HS curls 5# extension Passive stretch Feet on ball bridge, isometric abs Vaso high pressure 34 degrees  12/22/23 Bike level 4 x 6 minutes seat #7 25# HS curls On airex cone toe touches On airex ball toss On airex eyes closed , head turns and nods Slant board stretch Leg press 20# 2x10 LAQ 5# PROM extension and flexion, scar and joint mobs Vaso in elevation 34 degrees medium pressure  12/17/23 Walk outdoors 1 big lap  Bike seat  L2x L knee PROM w/ end range holds  Patella mobs  Tibiofemoral jt mobs Leg ext 5# 2x10 HS curls 25# 2x10 Step ups 6 Vaso medium pressure 34d    12/15/23 Bike position #8 x 7  minutes 6in forward and lateral step ups x10 each  Heel raised black bar 2x10 Leg press 40lb 2x15 20lb resisted side steps over WaTE x5  L knee PROM w/ end rnage holds  Patella mobs  Tibiofemoral jt mobs Vaso medium pressure 34  degrees   PATIENT EDUCATION:  Education details: reviewed RICE, gait, HEP Person educated: Patient Education method: Programmer, Multimedia, Demonstration, Tactile cues, Verbal cues, and Handouts Education comprehension: verbalized understanding, returned demonstration, verbal cues required, and tactile cues required  HOME EXERCISE PROGRAM: QS, chair scoots for flexion, ankle pumps, SLR, LAQ  ASSESSMENT:  CLINICAL IMPRESSION: Pt doing well, Some compensation remains with 8 in step ups. Some pain during the eccentric load of step downs. Cues for full ROM needed with curls and Ext. Overall pt is progressing well. Pt will benefit from continues skilled PT service to increase L knee strength and ROM to improve her functional capacity.  Patient is a 63 y.o. female who was seen today for physical therapy evaluation and treatment for s/p left TKA on 11/10/23.  She is in a lot pain, has significant tenderness, a lot of redness and warmth in the medial knee.  Reports that she cannot raise the foot up without help but with encouragement she could, was able to do a SAQ.  TUG time was 56 seconds.   Has a lot of edema.   OBJECTIVE IMPAIRMENTS: Abnormal gait, cardiopulmonary status limiting activity, decreased activity tolerance, decreased balance, decreased coordination, decreased endurance, decreased mobility, difficulty walking, decreased ROM, decreased strength, increased edema, increased muscle spasms, impaired flexibility, and pain.    REHAB POTENTIAL: Good  CLINICAL DECISION MAKING: Evolving/moderate complexity  EVALUATION COMPLEXITY: Moderate   GOALS: Goals reviewed with patient? Yes  SHORT TERM GOALS: Target date: 12/06/23 Independent with initial HEP Baseline: Goal status: met 11/19/23  LONG TERM GOALS: Target date: 04/12/24  Independent with advanced HEP Baseline:  Goal status: INITIAL  2.  Walk with LRAD all distances with minimal deviations Baseline:  Goal status: met 01/01/24  3.   Increase AROM of the left knee to 0-120 degrees flexion Baseline:  Goal status: progressing 01/08/24  4.  Decrease pain 50% Baseline:  Goal status: progressing 01/08/24  5.  Decrease TUG time to 18 seconds Baseline:  Goal status: met 12/22/23  6.  Go up and down stairs step over step Baseline:  Goal status: met 01/01/24  PLAN:  PT FREQUENCY: 3x/week to start and then will decrease as she improves  PT DURATION: 12 weeks  PLANNED INTERVENTIONS: 97164- PT Re-evaluation, 97110-Therapeutic exercises, 97530- Therapeutic activity, 97112- Neuromuscular re-education, 97535- Self Care, [id number]- Manual therapy, [phone]- Gait training, [phone]- Electrical stimulation (unattended), 97016- Vasopneumatic device, Patient/Family education, Balance training, Stair training, Joint mobilization, and Cryotherapy  PLAN FOR NEXT SESSION: see how she does with her trip   Tanda KANDICE Sorrow, PTA 01/12/2024, 9:21 AM  "

## 2024-01-14 ENCOUNTER — Encounter: Payer: Self-pay | Admitting: Physical Therapy

## 2024-01-14 ENCOUNTER — Ambulatory Visit: Admitting: Physical Therapy

## 2024-01-14 DIAGNOSIS — Z96651 Presence of right artificial knee joint: Secondary | ICD-10-CM

## 2024-01-14 DIAGNOSIS — M6281 Muscle weakness (generalized): Secondary | ICD-10-CM

## 2024-01-14 DIAGNOSIS — R600 Localized edema: Secondary | ICD-10-CM

## 2024-01-14 DIAGNOSIS — M25562 Pain in left knee: Secondary | ICD-10-CM | POA: Diagnosis not present

## 2024-01-14 DIAGNOSIS — R262 Difficulty in walking, not elsewhere classified: Secondary | ICD-10-CM

## 2024-01-14 DIAGNOSIS — M25662 Stiffness of left knee, not elsewhere classified: Secondary | ICD-10-CM

## 2024-01-14 NOTE — Therapy (Signed)
 " OUTPATIENT PHYSICAL THERAPY LOWER EXTREMITY TREATMENT   Patient Name: [name] MRN: [id number] DOB:[birth year], 63 y.o., female Today's Date: 01/14/2024  END OF SESSION:  PT End of Session - 01/14/24 1018     Visit Number 25    Date for Recertification  02/13/24    Authorization Type [employer]    PT Start Time 1015    PT Stop Time 1120    PT Time Calculation (min) 65 min    Activity Tolerance Patient tolerated treatment well    Behavior During Therapy WFL for tasks assessed/performed           Past Medical History:  Diagnosis Date   Anxiety    Arthritis    Back pain of lumbar region with sciatica    Cancer (HCC)    breast   Depression    Family history of anesthesia complication    grandfather died under anesthesia 70's- had heart issues   H/O carpal tunnel repair 2014   Headache(784.0)    Hyperlipidemia    Hypertension    Hypothyroidism    Medial meniscus tear    PONV (postoperative nausea and vomiting)    Pre-diabetes    Sleep apnea    cpap since 10   Past Surgical History:  Procedure Laterality Date   BILATERAL TOTAL MASTECTOMY WITH AXILLARY LYMPH NODE DISSECTION     BREAST RECONSTRUCTION Left 07/17/2023   for encapsulation,  done at [facility]   CERVICAL DISC ARTHROPLASTY N/A 03/19/2013   Procedure: CERVICAL FIVE TO SIX, CERVICAL SIX TO SEVEN CERVICAL ANTERIOR DISC ARTHROPLASTY;  Surgeon: Victory Gens, MD;  Location: [facility];  Service: Neurosurgery;  Laterality: N/A;  C56 C67 artificial disc replacement   CESAREAN SECTION     COLONOSCOPY     DILATATION & CURETTAGE/HYSTEROSCOPY WITH TRUECLEAR N/A 11/25/2013   Procedure: DILATATION & CURETTAGE/HYSTEROSCOPY WITH TRUCLEAR;  Surgeon: Charlie CHRISTELLA Croak, MD;  Location: [facility];  Service: Gynecology;  Laterality: N/A;   ECTOPIC PREGNANCY SURGERY     GALLBLADDER SURGERY     MOUTH SURGERY     child- dog bite   SPINAL FUSION     2023   STERIOD INJECTION Left 09/09/2022   Procedure: LEFT KNEE STEROID INJECTION;   Surgeon: Melodi Lerner, MD;  Location: [facility];  Service: Orthopedics;  Laterality: Left;  left knee injection 0928   TONSILLECTOMY     TOTAL KNEE ARTHROPLASTY Right 09/09/2022   Procedure: TOTAL KNEE ARTHROPLASTY;  Surgeon: Melodi Lerner, MD;  Location: [facility];  Service: Orthopedics;  Laterality: Right;   TOTAL KNEE ARTHROPLASTY Left 11/10/2023   Procedure: ARTHROPLASTY, KNEE, TOTAL;  Surgeon: Melodi Lerner, MD;  Location: [facility];  Service: Orthopedics;  Laterality: Left;   TUBAL LIGATION     Patient Active Problem List   Diagnosis Date Noted   Primary osteoarthritis of left knee 11/10/2023   OA (osteoarthritis) of knee 09/09/2022   Exertional dyspnea 04/17/2022   Elevated coronary artery calcium  score 04/17/2022   Spondylolisthesis at L4-L5 level 12/27/2021   Ductal carcinoma in situ (DCIS) of left breast 01/19/2018   Generalized anxiety disorder 11/07/2017   Depression 11/07/2017   Insomnia 11/07/2017   Cervical spondylosis 03/19/2013    PCP: Onita, MD  REFERRING PROVIDER: Bethanie, PA  REFERRING DIAG: s/p left TKA  THERAPY DIAG:  Acute pain of left knee  Stiffness of left knee, not elsewhere classified  Localized edema  Difficulty in walking, not elsewhere classified  S/P total knee arthroplasty, right  Muscle weakness (generalized)  Rationale for Evaluation and Treatment: Rehabilitation  ONSET DATE: 11/10/23  SUBJECTIVE:   SUBJECTIVE STATEMENT: Doing well, no real set backs  Patient underwent a left TKA on 11/10/23, she reports that she is doing okay, reports that she had noodle leg, reports trying to do the exercises daily  PERTINENT HISTORY: 63 yo female s/p L TKA on 11/10/23. PMH: R TKA, back pain, L3-4, 4-5 fusion in 2023, HTN, DM, C-spine arthroplasty  PAIN:  Are you having pain? Yes: NPRS scale: 4/10 Pain location: left knee Pain description: ache, sharpe , tender Aggravating factors: moving, walking , bending, I can't straighten the leg pain up  to 10/10 Relieving factors: pain meds, ice, rest, elevation at best pain a 7/10  PRECAUTIONS: Other: back surgery in the past  RED FLAGS: None   WEIGHT BEARING RESTRICTIONS: No  FALLS:  Has patient fallen in last 6 months? No  LIVING ENVIRONMENT: Lives with: lives with their family Lives in: House/apartment Stairs: Yes: Internal: 20 steps; can reach both Has following equipment at home: Single point cane, Walker - 2 wheeled, shower chair, and Grab bars  OCCUPATION: volunteers  PLOF: Independent and no device was doing yoga  PATIENT GOALS: walk without device, have no pain, normal ROM, go up and down stairs step over step  NEXT MD VISIT: November 3  OBJECTIVE:  Note: Objective measures were completed at Evaluation unless otherwise noted.  DIAGNOSTIC FINDINGS: none  COGNITION: Overall cognitive status: Within functional limits for tasks assessed     SENSATION: WFL  EDEMA:  Circumferential: right mid patella 44cm, left 49 cm  MUSCLE LENGTH: Not tested  POSTURE: rounded shoulders, forward head, and knee guarded   PALPATION: Very sore and tender, some bruising in the thigh, very red in the medial knee with heat  LOWER EXTREMITY ROM:  Active ROM LEFT AROM 11/13/23 eval Left PROM 11/13/23 eval AROM LEFT 11/24/23 AROM LEft  12/08/23 AROM LEft 12/22/23 AROM Left 01/01/24  Hip flexion        Hip extension        Hip abduction        Hip adduction        Hip internal rotation        Hip external rotation        Knee flexion 80 85 95 102 active Passive to 110 111 116  Knee extension 25 20 20 15 7 5   Ankle dorsiflexion        Ankle plantarflexion        Ankle inversion        Ankle eversion         (Blank rows = not tested)  LOWER EXTREMITY MMT:  MMT Right eval Left eval  Hip flexion    Hip extension    Hip abduction    Hip adduction    Hip internal rotation    Hip external rotation    Knee flexion    Knee extension    Ankle dorsiflexion     Ankle plantarflexion    Ankle inversion    Ankle eversion     (Blank rows = not tested)  FUNCTIONAL TESTS:  5 times sit to stand: 35 seconds Timed up and go (TUG): 56 seconds with FWW  GAIT: Distance walked: 60 feet Assistive device utilized: Walker - 2 wheeled Level of assistance: CGA Comments: step to pattern, shortened stance phase on the left  TREATMENT DATE:  01/14/24 Bike level 5 x 5 minutes Elliptical level 4 x 4 minutes 6 step ups fwd and side step On bosu ball toss and reaching 35# HS curls 10# leg ext Passive stretch Resisted gait 40# all directions Vaso 34 degrees medium pressure   01/12/24 Bike level 5  seat #5 6 minutes Elliptical Level 4 x 3 minutes 6in step ups x10 8in step ups x10  6in lateral step ups x10 each Leg press 40lb 2x12 S2S holding yellow ball on airex 2x10 HS vurls 35lb 2x10 Leg Ext 10lb 2x10 Vaso 34 degrees left knee in elevation  01/08/24 Bike level 5  seat #5 6 minutes Elliptical Level 4 x 3 minutes 35# leg curls 2x10 5# leg ext 50# resisted gait 6 side step ups Leg press 30# On upside down bosu ball toss Feet on ball K2C, rotation, bridge, isometric abs Passive stretch left knee flexion and extension Vaso 34 degrees left knee in elevation  01/07/24 Bike level 4 x 7 minutes 40# resisted gait all directions 6in step ups x10 8in step ups x10  Elliptical L2.5 x 2 minutes Leg press 50lb 2x10  Passive stretch flexion and extension  Patellar mobs Vaso medium pressure   01/05/24 Bike level 4 x 7 minutes 40# resisted gait all directions 40# leg press 2 x 10, then 20# single legs 35# leg curls 5# leg ext 4 block more square step overs Elliptical x 2 minutes Feet on ball K2C, rotation, bridge, isometric abs Passive stretch flexion and extension Vaso medium pressure  01/01/24 Bike seat #5  and then moved to 4 level 4 x 7 minutes 25# leg curls 5# leg extension 20# leg press Passive stretch to the left leg flexion and extension STM to the left LE and the back Vaso to the left leg high pressure 34 degrees  12/31/23 Gait around the back building, then the parking [place] and then to the front door to simulate walking in airport Bike Level 5 x 6 minutes LEg curls 25# 2 x 12 5# leg extension 40# resisted gait all directions 8 toe clears Feet on ball K2C, rotation Passive stretch left knee Vaso left knee medium pressure 34 degrees  12/29/23 Bike level 5 x 6 minutes 25# HS curls 5# extension Leg press 20# then no weight as low as you can go 3 block more square step overs 40# resisted gait all directions Passive stretch into flexion On airex reaching with foot to colors Vaso medium pressure in elevation 34 degrees  12/25/23 Bike seat #5 , level 5 x 6 minutes More square 3 blocks step overs 25# HS curls 5# leg extension Bosu upside down  On airex toe touches on colored cones called out by PT Leg press 20# then working on flexion Passive stretch to the left knee Vaso medium pressure 34 degrees in elevation  12/24/23 Bike level 4 seat # 5 x 5 minutes Gait outside Stairs step over step On bosu upside down reaching More square 3 blocks step over 8 step ups alternating feet 25# HS curls 5# extension Passive stretch Feet on ball bridge, isometric abs Vaso high pressure 34 degrees  12/22/23 Bike level 4 x 6 minutes seat #7 25# HS curls On airex cone toe touches On airex ball toss On airex eyes closed , head turns and nods Slant board stretch Leg press 20# 2x10 LAQ 5# PROM extension and flexion, scar and joint mobs Vaso in elevation 34 degrees medium pressure  12/17/23 Walk outdoors 1  big lap  Bike seat  L2x L knee PROM w/ end range holds  Patella mobs  Tibiofemoral jt mobs Leg ext 5# 2x10 HS curls 25# 2x10 Step ups 6 Vaso medium pressure  34d    12/15/23 Bike position #8 x 7 minutes 6in forward and lateral step ups x10 each  Heel raised black bar 2x10 Leg press 40lb 2x15 20lb resisted side steps over WaTE x5  L knee PROM w/ end rnage holds  Patella mobs  Tibiofemoral jt mobs Vaso medium pressure 34 degrees   PATIENT EDUCATION:  Education details: reviewed RICE, gait, HEP Person educated: Patient Education method: Programmer, Multimedia, Demonstration, Tactile cues, Verbal cues, and Handouts Education comprehension: verbalized understanding, returned demonstration, verbal cues required, and tactile cues required  HOME EXERCISE PROGRAM: QS, chair scoots for flexion, ankle pumps, SLR, LAQ  ASSESSMENT:  CLINICAL IMPRESSION: Pt doing well, Some compensation remains with 8 in step ups. Some pain during the eccentric load of step downs. Cues for full ROM needed with curls and Ext. Overall pt is progressing well. Pt will benefit from continues skilled PT service to increase L knee strength and ROM to improve her functional capacity.  Patient is a 63 y.o. female who was seen today for physical therapy evaluation and treatment for s/p left TKA on 11/10/23.  She is in a lot pain, has significant tenderness, a lot of redness and warmth in the medial knee.  Reports that she cannot raise the foot up without help but with encouragement she could, was able to do a SAQ.  TUG time was 56 seconds.   Has a lot of edema.   OBJECTIVE IMPAIRMENTS: Abnormal gait, cardiopulmonary status limiting activity, decreased activity tolerance, decreased balance, decreased coordination, decreased endurance, decreased mobility, difficulty walking, decreased ROM, decreased strength, increased edema, increased muscle spasms, impaired flexibility, and pain.    REHAB POTENTIAL: Good  CLINICAL DECISION MAKING: Evolving/moderate complexity  EVALUATION COMPLEXITY: Moderate   GOALS: Goals reviewed with patient? Yes  SHORT TERM GOALS: Target date:  12/06/23 Independent with initial HEP Baseline: Goal status: met 11/19/23  LONG TERM GOALS: Target date: 04/12/24  Independent with advanced HEP Baseline:  Goal status: progressing 01/14/24  2.  Walk with LRAD all distances with minimal deviations Baseline:  Goal status: met 01/01/24  3.  Increase AROM of the left knee to 0-120 degrees flexion Baseline:  Goal status: progressing 01/08/24  4.  Decrease pain 50% Baseline:  Goal status: progressing 01/08/24  5.  Decrease TUG time to 18 seconds Baseline:  Goal status: met 12/22/23  6.  Go up and down stairs step over step Baseline:  Goal status: met 01/01/24  PLAN:  PT FREQUENCY: 3x/week to start and then will decrease as she improves  PT DURATION: 12 weeks  PLANNED INTERVENTIONS: 97164- PT Re-evaluation, 97110-Therapeutic exercises, 97530- Therapeutic activity, 97112- Neuromuscular re-education, 97535- Self Care, [id number]- Manual therapy, [phone]- Gait training, [phone]- Electrical stimulation (unattended), 97016- Vasopneumatic device, Patient/Family education, Balance training, Stair training, Joint mobilization, and Cryotherapy  PLAN FOR NEXT SESSION: see how she does with her trip   OBADIAH OZELL ORN, PT 01/14/2024, 10:21 AM  "

## 2024-01-19 ENCOUNTER — Telehealth: Payer: Self-pay | Admitting: Adult Health

## 2024-01-19 ENCOUNTER — Ambulatory Visit: Admitting: Physical Therapy

## 2024-01-19 ENCOUNTER — Encounter: Payer: Self-pay | Admitting: Physical Therapy

## 2024-01-19 DIAGNOSIS — G8929 Other chronic pain: Secondary | ICD-10-CM

## 2024-01-19 DIAGNOSIS — M6281 Muscle weakness (generalized): Secondary | ICD-10-CM

## 2024-01-19 DIAGNOSIS — M25562 Pain in left knee: Secondary | ICD-10-CM

## 2024-01-19 DIAGNOSIS — M25662 Stiffness of left knee, not elsewhere classified: Secondary | ICD-10-CM

## 2024-01-19 DIAGNOSIS — Z96651 Presence of right artificial knee joint: Secondary | ICD-10-CM

## 2024-01-19 DIAGNOSIS — R262 Difficulty in walking, not elsewhere classified: Secondary | ICD-10-CM

## 2024-01-19 DIAGNOSIS — R600 Localized edema: Secondary | ICD-10-CM

## 2024-01-19 NOTE — Telephone Encounter (Signed)
 LVM and sent Mychart message informing pt of needed reschedule 11/04/24- NP out

## 2024-01-19 NOTE — Therapy (Signed)
 " OUTPATIENT PHYSICAL THERAPY LOWER EXTREMITY TREATMENT   Patient Name: [name] MRN: [id number] DOB:[birth year], 64 y.o., female Today's Date: 01/19/2024  END OF SESSION:  PT End of Session - 01/19/24 1616     Visit Number 26    Date for Recertification  02/13/24    Authorization Type [employer]    PT Start Time 1615    PT Stop Time 1715    PT Time Calculation (min) 60 min    Activity Tolerance Patient tolerated treatment well    Behavior During Therapy WFL for tasks assessed/performed           Past Medical History:  Diagnosis Date   Anxiety    Arthritis    Back pain of lumbar region with sciatica    Cancer (HCC)    breast   Depression    Family history of anesthesia complication    grandfather died under anesthesia 70's- had heart issues   H/O carpal tunnel repair 2014   Headache(784.0)    Hyperlipidemia    Hypertension    Hypothyroidism    Medial meniscus tear    PONV (postoperative nausea and vomiting)    Pre-diabetes    Sleep apnea    cpap since 10   Past Surgical History:  Procedure Laterality Date   BILATERAL TOTAL MASTECTOMY WITH AXILLARY LYMPH NODE DISSECTION     BREAST RECONSTRUCTION Left 07/17/2023   for encapsulation,  done at [facility]   CERVICAL DISC ARTHROPLASTY N/A 03/19/2013   Procedure: CERVICAL FIVE TO SIX, CERVICAL SIX TO SEVEN CERVICAL ANTERIOR DISC ARTHROPLASTY;  Surgeon: Victory Gens, MD;  Location: [facility];  Service: Neurosurgery;  Laterality: N/A;  C56 C67 artificial disc replacement   CESAREAN SECTION     COLONOSCOPY     DILATATION & CURETTAGE/HYSTEROSCOPY WITH TRUECLEAR N/A 11/25/2013   Procedure: DILATATION & CURETTAGE/HYSTEROSCOPY WITH TRUCLEAR;  Surgeon: Charlie CHRISTELLA Croak, MD;  Location: [facility];  Service: Gynecology;  Laterality: N/A;   ECTOPIC PREGNANCY SURGERY     GALLBLADDER SURGERY     MOUTH SURGERY     child- dog bite   SPINAL FUSION     2023   STERIOD INJECTION Left 09/09/2022   Procedure: LEFT KNEE STEROID INJECTION;   Surgeon: Melodi Lerner, MD;  Location: [facility];  Service: Orthopedics;  Laterality: Left;  left knee injection 0928   TONSILLECTOMY     TOTAL KNEE ARTHROPLASTY Right 09/09/2022   Procedure: TOTAL KNEE ARTHROPLASTY;  Surgeon: Melodi Lerner, MD;  Location: [facility];  Service: Orthopedics;  Laterality: Right;   TOTAL KNEE ARTHROPLASTY Left 11/10/2023   Procedure: ARTHROPLASTY, KNEE, TOTAL;  Surgeon: Melodi Lerner, MD;  Location: [facility];  Service: Orthopedics;  Laterality: Left;   TUBAL LIGATION     Patient Active Problem List   Diagnosis Date Noted   Primary osteoarthritis of left knee 11/10/2023   OA (osteoarthritis) of knee 09/09/2022   Exertional dyspnea 04/17/2022   Elevated coronary artery calcium  score 04/17/2022   Spondylolisthesis at L4-L5 level 12/27/2021   Ductal carcinoma in situ (DCIS) of left breast 01/19/2018   Generalized anxiety disorder 11/07/2017   Depression 11/07/2017   Insomnia 11/07/2017   Cervical spondylosis 03/19/2013    PCP: Onita, MD  REFERRING PROVIDER: Bethanie, PA  REFERRING DIAG: s/p left TKA  THERAPY DIAG:  Acute pain of left knee  Stiffness of left knee, not elsewhere classified  Localized edema  Difficulty in walking, not elsewhere classified  S/P total knee arthroplasty, right  Muscle weakness (generalized)  Chronic bilateral back pain, unspecified back location  Rationale for Evaluation and Treatment: Rehabilitation  ONSET DATE: 11/10/23  SUBJECTIVE:   SUBJECTIVE STATEMENT: I have been on my feet a lot over the past week, up 12 + hours a day on my feet, I feel stiff and sore, my back is really hurting a little more a 7-8/10, scheduled for injection 02/04/24  Patient underwent a left TKA on 11/10/23, she reports that she is doing okay, reports that she had noodle leg, reports trying to do the exercises daily  PERTINENT HISTORY: 63 yo female s/p L TKA on 11/10/23. PMH: R TKA, back pain, L3-4, 4-5 fusion in 2023, HTN, DM, C-spine  arthroplasty  PAIN:  Are you having pain? Yes: NPRS scale: 4/10 Pain location: left knee Pain description: ache, sharpe , tender Aggravating factors: moving, walking , bending, I can't straighten the leg pain up to 10/10 Relieving factors: pain meds, ice, rest, elevation at best pain a 7/10  PRECAUTIONS: Other: back surgery in the past  RED FLAGS: None   WEIGHT BEARING RESTRICTIONS: No  FALLS:  Has patient fallen in last 6 months? No  LIVING ENVIRONMENT: Lives with: lives with their family Lives in: House/apartment Stairs: Yes: Internal: 20 steps; can reach both Has following equipment at home: Single point cane, Walker - 2 wheeled, shower chair, and Grab bars  OCCUPATION: volunteers  PLOF: Independent and no device was doing yoga  PATIENT GOALS: walk without device, have no pain, normal ROM, go up and down stairs step over step  NEXT MD VISIT: November 3  OBJECTIVE:  Note: Objective measures were completed at Evaluation unless otherwise noted.  DIAGNOSTIC FINDINGS: none  COGNITION: Overall cognitive status: Within functional limits for tasks assessed     SENSATION: WFL  EDEMA:  Circumferential: right mid patella 44cm, left 49 cm  MUSCLE LENGTH: Not tested  POSTURE: rounded shoulders, forward head, and knee guarded   PALPATION: Very sore and tender, some bruising in the thigh, very red in the medial knee with heat  LOWER EXTREMITY ROM:  Active ROM LEFT AROM 11/13/23 eval Left PROM 11/13/23 eval AROM LEFT 11/24/23 AROM LEft  12/08/23 AROM LEft 12/22/23 AROM Left 01/01/24  Hip flexion        Hip extension        Hip abduction        Hip adduction        Hip internal rotation        Hip external rotation        Knee flexion 80 85 95 102 active Passive to 110 111 116  Knee extension 25 20 20 15 7 5   Ankle dorsiflexion        Ankle plantarflexion        Ankle inversion        Ankle eversion         (Blank rows = not tested)  LOWER  EXTREMITY MMT:  MMT Right eval Left eval  Hip flexion    Hip extension    Hip abduction    Hip adduction    Hip internal rotation    Hip external rotation    Knee flexion    Knee extension    Ankle dorsiflexion    Ankle plantarflexion    Ankle inversion    Ankle eversion     (Blank rows = not tested)  FUNCTIONAL TESTS:  5 times sit to stand: 35 seconds Timed up and go (TUG): 56 seconds with FWW  GAIT: Distance  walked: 60 feet Assistive device utilized: Walker - 2 wheeled Level of assistance: CGA Comments: step to pattern, shortened stance phase on the left                                                                                                                                 TREATMENT DATE:  01/19/24 Bike level 5 x 5 minutes seat position 5 Ball rollouts for back stretch Feet on ball K2C, rotation, small bridge, isometric abs Seated STM to the right SI area, some into the lumbar area and the right hip area Manual pelvic traction Vaso medium pressure 37 degrees in elevation  01/14/24 Bike level 5 x 5 minutes Elliptical level 4 x 4 minutes 6 step ups fwd and side step On bosu ball toss and reaching 35# HS curls 10# leg ext Passive stretch Resisted gait 40# all directions Vaso 34 degrees medium pressure   01/12/24 Bike level 5  seat #5 6 minutes Elliptical Level 4 x 3 minutes 6in step ups x10 8in step ups x10  6in lateral step ups x10 each Leg press 40lb 2x12 S2S holding yellow ball on airex 2x10 HS vurls 35lb 2x10 Leg Ext 10lb 2x10 Vaso 34 degrees left knee in elevation  01/08/24 Bike level 5  seat #5 6 minutes Elliptical Level 4 x 3 minutes 35# leg curls 2x10 5# leg ext 50# resisted gait 6 side step ups Leg press 30# On upside down bosu ball toss Feet on ball K2C, rotation, bridge, isometric abs Passive stretch left knee flexion and extension Vaso 34 degrees left knee in elevation  01/07/24 Bike level 4 x 7 minutes 40# resisted  gait all directions 6in step ups x10 8in step ups x10  Elliptical L2.5 x 2 minutes Leg press 50lb 2x10  Passive stretch flexion and extension  Patellar mobs Vaso medium pressure   01/05/24 Bike level 4 x 7 minutes 40# resisted gait all directions 40# leg press 2 x 10, then 20# single legs 35# leg curls 5# leg ext 4 block more square step overs Elliptical x 2 minutes Feet on ball K2C, rotation, bridge, isometric abs Passive stretch flexion and extension Vaso medium pressure  01/01/24 Bike seat #5 and then moved to 4 level 4 x 7 minutes 25# leg curls 5# leg extension 20# leg press Passive stretch to the left leg flexion and extension STM to the left LE and the back Vaso to the left leg high pressure 34 degrees  12/31/23 Gait around the back building, then the parking [place] and then to the front door to simulate walking in airport Bike Level 5 x 6 minutes LEg curls 25# 2 x 12 5# leg extension 40# resisted gait all directions 8 toe clears Feet on ball K2C, rotation Passive stretch left knee Vaso left knee medium pressure 34 degrees  12/29/23 Bike level 5 x 6 minutes 25# HS curls 5# extension Leg press 20# then no weight  as low as you can go 3 block more square step overs 40# resisted gait all directions Passive stretch into flexion On airex reaching with foot to colors Vaso medium pressure in elevation 34 degrees  12/25/23 Bike seat #5 , level 5 x 6 minutes More square 3 blocks step overs 25# HS curls 5# leg extension Bosu upside down  On airex toe touches on colored cones called out by PT Leg press 20# then working on flexion Passive stretch to the left knee Vaso medium pressure 34 degrees in elevation  12/24/23 Bike level 4 seat # 5 x 5 minutes Gait outside Stairs step over step On bosu upside down reaching More square 3 blocks step over 8 step ups alternating feet 25# HS curls 5# extension Passive stretch Feet on ball bridge, isometric  abs Vaso high pressure 34 degrees  12/22/23 Bike level 4 x 6 minutes seat #7 25# HS curls On airex cone toe touches On airex ball toss On airex eyes closed , head turns and nods Slant board stretch Leg press 20# 2x10 LAQ 5# PROM extension and flexion, scar and joint mobs Vaso in elevation 34 degrees medium pressure  12/17/23 Walk outdoors 1 big lap  Bike seat  L2x L knee PROM w/ end range holds  Patella mobs  Tibiofemoral jt mobs Leg ext 5# 2x10 HS curls 25# 2x10 Step ups 6 Vaso medium pressure 34d    12/15/23 Bike position #8 x 7 minutes 6in forward and lateral step ups x10 each  Heel raised black bar 2x10 Leg press 40lb 2x15 20lb resisted side steps over WaTE x5  L knee PROM w/ end rnage holds  Patella mobs  Tibiofemoral jt mobs Vaso medium pressure 34 degrees   PATIENT EDUCATION:  Education details: reviewed RICE, gait, HEP Person educated: Patient Education method: Programmer, Multimedia, Demonstration, Tactile cues, Verbal cues, and Handouts Education comprehension: verbalized understanding, returned demonstration, verbal cues required, and tactile cues required  HOME EXERCISE PROGRAM: QS, chair scoots for flexion, ankle pumps, SLR, LAQ  ASSESSMENT:  CLINICAL IMPRESSION: Was on her feet most of the last 5 days, reports some days 12 hours, she reports that she did not do much exercise, having significant pain int he right low back, seems to be the right SI area, there are some knots here,  /I worked on them today and tried the traction again  Patient is a 63 y.o. female who was seen today for physical therapy evaluation and treatment for s/p left TKA on 11/10/23.  She is in a lot pain, has significant tenderness, a lot of redness and warmth in the medial knee.  Reports that she cannot raise the foot up without help but with encouragement she could, was able to do a SAQ.  TUG time was 56 seconds.   Has a lot of edema.   OBJECTIVE IMPAIRMENTS: Abnormal gait,  cardiopulmonary status limiting activity, decreased activity tolerance, decreased balance, decreased coordination, decreased endurance, decreased mobility, difficulty walking, decreased ROM, decreased strength, increased edema, increased muscle spasms, impaired flexibility, and pain.    REHAB POTENTIAL: Good  CLINICAL DECISION MAKING: Evolving/moderate complexity  EVALUATION COMPLEXITY: Moderate   GOALS: Goals reviewed with patient? Yes  SHORT TERM GOALS: Target date: 12/06/23 Independent with initial HEP Baseline: Goal status: met 11/19/23  LONG TERM GOALS: Target date: 04/12/24  Independent with advanced HEP Baseline:  Goal status: progressing 01/14/24  2.  Walk with LRAD all distances with minimal deviations Baseline:  Goal status: met 01/01/24  3.  Increase AROM of the left knee to 0-120 degrees flexion Baseline:  Goal status: progressing 01/08/24  4.  Decrease pain 50% Baseline:  Goal status: progressing 01/08/24  5.  Decrease TUG time to 18 seconds Baseline:  Goal status: met 12/22/23  6.  Go up and down stairs step over step Baseline:  Goal status: met 01/01/24  PLAN:  PT FREQUENCY: 3x/week to start and then will decrease as she improves  PT DURATION: 12 weeks  PLANNED INTERVENTIONS: 97164- PT Re-evaluation, 97110-Therapeutic exercises, 97530- Therapeutic activity, 97112- Neuromuscular re-education, 97535- Self Care, [id number]- Manual therapy, [phone]- Gait training, [phone]- Electrical stimulation (unattended), 97016- Vasopneumatic device, Patient/Family education, Balance training, Stair training, Joint mobilization, and Cryotherapy  PLAN FOR NEXT SESSION: assess, see how the low back is   OBADIAH OZELL ORN, PT 01/19/2024, 4:19 PM  "

## 2024-01-21 ENCOUNTER — Ambulatory Visit: Admitting: Physical Therapy

## 2024-01-21 ENCOUNTER — Encounter: Payer: Self-pay | Admitting: Physical Therapy

## 2024-01-21 DIAGNOSIS — M25562 Pain in left knee: Secondary | ICD-10-CM | POA: Diagnosis not present

## 2024-01-21 DIAGNOSIS — M25662 Stiffness of left knee, not elsewhere classified: Secondary | ICD-10-CM

## 2024-01-21 DIAGNOSIS — R262 Difficulty in walking, not elsewhere classified: Secondary | ICD-10-CM

## 2024-01-21 DIAGNOSIS — Z96651 Presence of right artificial knee joint: Secondary | ICD-10-CM

## 2024-01-21 DIAGNOSIS — R600 Localized edema: Secondary | ICD-10-CM

## 2024-01-21 DIAGNOSIS — M6281 Muscle weakness (generalized): Secondary | ICD-10-CM

## 2024-01-21 DIAGNOSIS — G8929 Other chronic pain: Secondary | ICD-10-CM

## 2024-01-21 NOTE — Therapy (Signed)
 " OUTPATIENT PHYSICAL THERAPY LOWER EXTREMITY TREATMENT   Patient Name: [name] MRN: [id number] DOB:1961-01-16, 63 y.o., female Today's Date: 01/21/2024  END OF SESSION:  PT End of Session - 01/21/24 1448     Visit Number 27    Date for Recertification  02/13/24    Authorization Type U[employer]    PT Start Time 1745    PT Stop Time 1845    PT Time Calculation (min) 60 min    Activity Tolerance Patient tolerated treatment well    Behavior During Therapy WFL for tasks assessed/performed           Past Medical History:  Diagnosis Date   Anxiety    Arthritis    Back pain of lumbar region with sciatica    Cancer (HCC)    breast   Depression    Family history of anesthesia complication    grandfather died under anesthesia 70's- had heart issues   H/O carpal tunnel repair 2014   Headache(784.0)    Hyperlipidemia    Hypertension    Hypothyroidism    Medial meniscus tear    PONV (postoperative nausea and vomiting)    Pre-diabetes    Sleep apnea    cpap since 10   Past Surgical History:  Procedure Laterality Date   BILATERAL TOTAL MASTECTOMY WITH AXILLARY LYMPH NODE DISSECTION     BREAST RECONSTRUCTION Left 07/17/2023   for encapsulation,  done at [facility]   CERVICAL DISC ARTHROPLASTY N/A 03/19/2013   Procedure: CERVICAL FIVE TO SIX, CERVICAL SIX TO SEVEN CERVICAL ANTERIOR DISC ARTHROPLASTY;  Surgeon: Victory Gens, MD;  Location: [facility];  Service: Neurosurgery;  Laterality: N/A;  C56 C67 artificial disc replacement   CESAREAN SECTION     COLONOSCOPY     DILATATION & CURETTAGE/HYSTEROSCOPY WITH TRUECLEAR N/A 11/25/2013   Procedure: DILATATION & CURETTAGE/HYSTEROSCOPY WITH TRUCLEAR;  Surgeon: Charlie CHRISTELLA Croak, MD;  Location: [facility];  Service: Gynecology;  Laterality: N/A;   ECTOPIC PREGNANCY SURGERY     GALLBLADDER SURGERY     MOUTH SURGERY     child- dog bite   SPINAL FUSION     2023   STERIOD INJECTION Left 09/09/2022   Procedure: LEFT KNEE STEROID INJECTION;   Surgeon: Melodi Lerner, MD;  Location: [facility];  Service: Orthopedics;  Laterality: Left;  left knee injection 0928   TONSILLECTOMY     TOTAL KNEE ARTHROPLASTY Right 09/09/2022   Procedure: TOTAL KNEE ARTHROPLASTY;  Surgeon: Melodi Lerner, MD;  Location: [facility];  Service: Orthopedics;  Laterality: Right;   TOTAL KNEE ARTHROPLASTY Left 11/10/2023   Procedure: ARTHROPLASTY, KNEE, TOTAL;  Surgeon: Melodi Lerner, MD;  Location: [facility];  Service: Orthopedics;  Laterality: Left;   TUBAL LIGATION     Patient Active Problem List   Diagnosis Date Noted   Primary osteoarthritis of left knee 11/10/2023   OA (osteoarthritis) of knee 09/09/2022   Exertional dyspnea 04/17/2022   Elevated coronary artery calcium  score 04/17/2022   Spondylolisthesis at L4-L5 level 12/27/2021   Ductal carcinoma in situ (DCIS) of left breast 01/19/2018   Generalized anxiety disorder 11/07/2017   Depression 11/07/2017   Insomnia 11/07/2017   Cervical spondylosis 03/19/2013    PCP: Onita, MD  REFERRING PROVIDER: Bethanie, PA  REFERRING DIAG: s/p left TKA  THERAPY DIAG:  Acute pain of left knee  Stiffness of left knee, not elsewhere classified  Localized edema  Difficulty in walking, not elsewhere classified  S/P total knee arthroplasty, right  Muscle weakness (generalized)  Chronic bilateral back pain, unspecified back location  Rationale for Evaluation and Treatment: Rehabilitation  ONSET DATE: 11/10/23  SUBJECTIVE:   SUBJECTIVE STATEMENT: I was pretty sore after you worked on me, my son dry needled the spot and thinks it is the SI area  Patient underwent a left TKA on 11/10/23, she reports that she is doing okay, reports that she had noodle leg, reports trying to do the exercises daily  PERTINENT HISTORY: 63 yo female s/p L TKA on 11/10/23. PMH: R TKA, back pain, L3-4, 4-5 fusion in 2023, HTN, DM, C-spine arthroplasty  PAIN:  Are you having pain? Yes: NPRS scale: 4/10 Pain location: left  knee Pain description: ache, sharpe , tender Aggravating factors: moving, walking , bending, I can't straighten the leg pain up to 10/10 Relieving factors: pain meds, ice, rest, elevation at best pain a 7/10  PRECAUTIONS: Other: back surgery in the past  RED FLAGS: None   WEIGHT BEARING RESTRICTIONS: No  FALLS:  Has patient fallen in last 6 months? No  LIVING ENVIRONMENT: Lives with: lives with their family Lives in: House/apartment Stairs: Yes: Internal: 20 steps; can reach both Has following equipment at home: Single point cane, Walker - 2 wheeled, shower chair, and Grab bars  OCCUPATION: volunteers  PLOF: Independent and no device was doing yoga  PATIENT GOALS: walk without device, have no pain, normal ROM, go up and down stairs step over step  NEXT MD VISIT: November 3  OBJECTIVE:  Note: Objective measures were completed at Evaluation unless otherwise noted.  DIAGNOSTIC FINDINGS: none  COGNITION: Overall cognitive status: Within functional limits for tasks assessed     SENSATION: WFL  EDEMA:  Circumferential: right mid patella 44cm, left 49 cm  MUSCLE LENGTH: Not tested  POSTURE: rounded shoulders, forward head, and knee guarded   PALPATION: Very sore and tender, some bruising in the thigh, very red in the medial knee with heat  LOWER EXTREMITY ROM:  Active ROM LEFT AROM 11/13/23 eval Left PROM 11/13/23 eval AROM LEFT 11/24/23 AROM LEft  12/08/23 AROM LEft 12/22/23 AROM Left 01/01/24  Hip flexion        Hip extension        Hip abduction        Hip adduction        Hip internal rotation        Hip external rotation        Knee flexion 80 85 95 102 active Passive to 110 111 116  Knee extension 25 20 20 15 7 5   Ankle dorsiflexion        Ankle plantarflexion        Ankle inversion        Ankle eversion         (Blank rows = not tested)  LOWER EXTREMITY MMT:  MMT Right eval Left eval  Hip flexion    Hip extension    Hip abduction     Hip adduction    Hip internal rotation    Hip external rotation    Knee flexion    Knee extension    Ankle dorsiflexion    Ankle plantarflexion    Ankle inversion    Ankle eversion     (Blank rows = not tested)  FUNCTIONAL TESTS:  5 times sit to stand: 35 seconds Timed up and go (TUG): 56 seconds with FWW  GAIT: Distance walked: 60 feet Assistive device utilized: Walker - 2 wheeled Level of assistance: CGA Comments: step to pattern,  shortened stance phase on the left                                                                                                                                TREATMENT DATE:  01/21/24 Bike level 5 x 6 minutes LEg press 40#, 20# single legs Feet on ball K2C, rotation, small bridge, isometric abs 5# hip extension and abduction US  to the right SI STM to the right SI Manual pelvic traction Vaso medium pressure left knee  01/19/24 Bike level 5 x 5 minutes seat position 5 Ball rollouts for back stretch Feet on ball K2C, rotation, small bridge, isometric abs Seated STM to the right SI area, some into the lumbar area and the right hip area Manual pelvic traction Vaso medium pressure 37 degrees in elevation  01/14/24 Bike level 5 x 5 minutes Elliptical level 4 x 4 minutes 6 step ups fwd and side step On bosu ball toss and reaching 35# HS curls 10# leg ext Passive stretch Resisted gait 40# all directions Vaso 34 degrees medium pressure   01/12/24 Bike level 5  seat #5 6 minutes Elliptical Level 4 x 3 minutes 6in step ups x10 8in step ups x10  6in lateral step ups x10 each Leg press 40lb 2x12 S2S holding yellow ball on airex 2x10 HS vurls 35lb 2x10 Leg Ext 10lb 2x10 Vaso 34 degrees left knee in elevation  01/08/24 Bike level 5  seat #5 6 minutes Elliptical Level 4 x 3 minutes 35# leg curls 2x10 5# leg ext 50# resisted gait 6 side step ups Leg press 30# On upside down bosu ball toss Feet on ball K2C, rotation,  bridge, isometric abs Passive stretch left knee flexion and extension Vaso 34 degrees left knee in elevation  01/07/24 Bike level 4 x 7 minutes 40# resisted gait all directions 6in step ups x10 8in step ups x10  Elliptical L2.5 x 2 minutes Leg press 50lb 2x10  Passive stretch flexion and extension  Patellar mobs Vaso medium pressure   01/05/24 Bike level 4 x 7 minutes 40# resisted gait all directions 40# leg press 2 x 10, then 20# single legs 35# leg curls 5# leg ext 4 block more square step overs Elliptical x 2 minutes Feet on ball K2C, rotation, bridge, isometric abs Passive stretch flexion and extension Vaso medium pressure  01/01/24 Bike seat #5 and then moved to 4 level 4 x 7 minutes 25# leg curls 5# leg extension 20# leg press Passive stretch to the left leg flexion and extension STM to the left LE and the back Vaso to the left leg high pressure 34 degrees  12/31/23 Gait around the back building, then the parking [place] and then to the front door to simulate walking in airport Bike Level 5 x 6 minutes LEg curls 25# 2 x 12 5# leg extension 40# resisted gait all directions 8 toe clears Feet on ball K2C, rotation Passive stretch left  knee Vaso left knee medium pressure 34 degrees  12/29/23 Bike level 5 x 6 minutes 25# HS curls 5# extension Leg press 20# then no weight as low as you can go 3 block more square step overs 40# resisted gait all directions Passive stretch into flexion On airex reaching with foot to colors Vaso medium pressure in elevation 34 degrees  12/25/23 Bike seat #5 , level 5 x 6 minutes More square 3 blocks step overs 25# HS curls 5# leg extension Bosu upside down  On airex toe touches on colored cones called out by PT Leg press 20# then working on flexion Passive stretch to the left knee Vaso medium pressure 34 degrees in elevation  12/24/23 Bike level 4 seat # 5 x 5 minutes Gait outside Stairs step over step On bosu  upside down reaching More square 3 blocks step over 8 step ups alternating feet 25# HS curls 5# extension Passive stretch Feet on ball bridge, isometric abs Vaso high pressure 34 degrees  12/22/23 Bike level 4 x 6 minutes seat #7 25# HS curls On airex cone toe touches On airex ball toss On airex eyes closed , head turns and nods Slant board stretch Leg press 20# 2x10 LAQ 5# PROM extension and flexion, scar and joint mobs Vaso in elevation 34 degrees medium pressure  12/17/23 Walk outdoors 1 big lap  Bike seat  L2x L knee PROM w/ end range holds  Patella mobs  Tibiofemoral jt mobs Leg ext 5# 2x10 HS curls 25# 2x10 Step ups 6 Vaso medium pressure 34d   PATIENT EDUCATION:  Education details: reviewed RICE, gait, HEP Person educated: Patient Education method: Programmer, Multimedia, Demonstration, Tactile cues, Verbal cues, and Handouts Education comprehension: verbalized understanding, returned demonstration, verbal cues required, and tactile cues required  HOME EXERCISE PROGRAM: QS, chair scoots for flexion, ankle pumps, SLR, LAQ  ASSESSMENT:  CLINICAL IMPRESSION: Patient reports that she thinks what we did helped the other day for her back, she did get dry needled by her son who is a land.  I did some Us  and STM to the area, we also added some hip stability and strength exercises. She has a call into the MD that does injections to see if he can inject the SI  Patient is a 63 y.o. female who was seen today for physical therapy evaluation and treatment for s/p left TKA on 11/10/23.  She is in a lot pain, has significant tenderness, a lot of redness and warmth in the medial knee.  Reports that she cannot raise the foot up without help but with encouragement she could, was able to do a SAQ.  TUG time was 56 seconds.   Has a lot of edema.   OBJECTIVE IMPAIRMENTS: Abnormal gait, cardiopulmonary status limiting activity, decreased activity tolerance, decreased balance,  decreased coordination, decreased endurance, decreased mobility, difficulty walking, decreased ROM, decreased strength, increased edema, increased muscle spasms, impaired flexibility, and pain.    REHAB POTENTIAL: Good  CLINICAL DECISION MAKING: Evolving/moderate complexity  EVALUATION COMPLEXITY: Moderate   GOALS: Goals reviewed with patient? Yes  SHORT TERM GOALS: Target date: 12/06/23 Independent with initial HEP Baseline: Goal status: met 11/19/23  LONG TERM GOALS: Target date: 04/12/24  Independent with advanced HEP Baseline:  Goal status: progressing 01/14/24  2.  Walk with LRAD all distances with minimal deviations Baseline:  Goal status: met 01/01/24  3.  Increase AROM of the left knee to 0-120 degrees flexion Baseline:  Goal status: progressing 01/21/24  4.  Decrease pain 50% Baseline:  Goal status: progressing 01/21/24  5.  Decrease TUG time to 18 seconds Baseline:  Goal status: met 12/22/23  6.  Go up and down stairs step over step Baseline:  Goal status: met 01/01/24  PLAN:  PT FREQUENCY: 3x/week to start and then will decrease as she improves  PT DURATION: 12 weeks  PLANNED INTERVENTIONS: 97164- PT Re-evaluation, 97110-Therapeutic exercises, 97530- Therapeutic activity, 97112- Neuromuscular re-education, 97535- Self Care, 02859- Manual therapy, [phone]- Gait training, [phone]- Electrical stimulation (unattended), 97016- Vasopneumatic device, Patient/Family education, Balance training, Stair training, Joint mobilization, and Cryotherapy  PLAN FOR NEXT SESSION: assess, see how the low back is   OBADIAH OZELL ORN, PT 01/21/2024, 2:49 PM  "

## 2024-01-28 ENCOUNTER — Ambulatory Visit: Attending: Neurological Surgery | Admitting: Physical Therapy

## 2024-01-28 DIAGNOSIS — M549 Dorsalgia, unspecified: Secondary | ICD-10-CM | POA: Diagnosis present

## 2024-01-28 DIAGNOSIS — R600 Localized edema: Secondary | ICD-10-CM | POA: Diagnosis present

## 2024-01-28 DIAGNOSIS — M6281 Muscle weakness (generalized): Secondary | ICD-10-CM | POA: Diagnosis present

## 2024-01-28 DIAGNOSIS — G8929 Other chronic pain: Secondary | ICD-10-CM | POA: Diagnosis present

## 2024-01-28 DIAGNOSIS — R262 Difficulty in walking, not elsewhere classified: Secondary | ICD-10-CM | POA: Insufficient documentation

## 2024-01-28 DIAGNOSIS — M5416 Radiculopathy, lumbar region: Secondary | ICD-10-CM | POA: Diagnosis present

## 2024-01-28 DIAGNOSIS — R2689 Other abnormalities of gait and mobility: Secondary | ICD-10-CM | POA: Insufficient documentation

## 2024-01-28 NOTE — Therapy (Signed)
 " OUTPATIENT PHYSICAL THERAPY THORACOLUMBAR EVALUATION   Patient Name: [name] MRN: [id number] DOB:[birth year], 64 y.o., female Today's Date: 01/28/2024  END OF SESSION:  PT End of Session - 01/28/24 1405     Visit Number 1    Date for Recertification  04/27/24    Authorization Type U[employer]    PT Start Time 1403    PT Stop Time 1500    PT Time Calculation (min) 57 min    Activity Tolerance Patient tolerated treatment well    Behavior During Therapy WFL for tasks assessed/performed          Past Medical History:  Diagnosis Date   Anxiety    Arthritis    Back pain of lumbar region with sciatica    Cancer (HCC)    breast   Depression    Family history of anesthesia complication    grandfather died under anesthesia 70's- had heart issues   H/O carpal tunnel repair 2014   Headache(784.0)    Hyperlipidemia    Hypertension    Hypothyroidism    Medial meniscus tear    PONV (postoperative nausea and vomiting)    Pre-diabetes    Sleep apnea    cpap since 10   Past Surgical History:  Procedure Laterality Date   BILATERAL TOTAL MASTECTOMY WITH AXILLARY LYMPH NODE DISSECTION     BREAST RECONSTRUCTION Left 07/17/2023   for encapsulation,  done at [facility]   CERVICAL DISC ARTHROPLASTY N/A 03/19/2013   Procedure: CERVICAL FIVE TO SIX, CERVICAL SIX TO SEVEN CERVICAL ANTERIOR DISC ARTHROPLASTY;  Surgeon: Victory Gens, MD;  Location: [facility];  Service: Neurosurgery;  Laterality: N/A;  C56 C67 artificial disc replacement   CESAREAN SECTION     COLONOSCOPY     DILATATION & CURETTAGE/HYSTEROSCOPY WITH TRUECLEAR N/A 11/25/2013   Procedure: DILATATION & CURETTAGE/HYSTEROSCOPY WITH TRUCLEAR;  Surgeon: Charlie CHRISTELLA Croak, MD;  Location: [facility];  Service: Gynecology;  Laterality: N/A;   ECTOPIC PREGNANCY SURGERY     GALLBLADDER SURGERY     MOUTH SURGERY     child- dog bite   SPINAL FUSION     2023   STERIOD INJECTION Left 09/09/2022   Procedure: LEFT KNEE STEROID INJECTION;   Surgeon: Melodi Lerner, MD;  Location: [facility];  Service: Orthopedics;  Laterality: Left;  left knee injection 0928   TONSILLECTOMY     TOTAL KNEE ARTHROPLASTY Right 09/09/2022   Procedure: TOTAL KNEE ARTHROPLASTY;  Surgeon: Melodi Lerner, MD;  Location: [facility];  Service: Orthopedics;  Laterality: Right;   TOTAL KNEE ARTHROPLASTY Left 11/10/2023   Procedure: ARTHROPLASTY, KNEE, TOTAL;  Surgeon: Melodi Lerner, MD;  Location: [facility];  Service: Orthopedics;  Laterality: Left;   TUBAL LIGATION     Patient Active Problem List   Diagnosis Date Noted   Primary osteoarthritis of left knee 11/10/2023   OA (osteoarthritis) of knee 09/09/2022   Exertional dyspnea 04/17/2022   Elevated coronary artery calcium  score 04/17/2022   Spondylolisthesis at L4-L5 level 12/27/2021   Ductal carcinoma in situ (DCIS) of left breast 01/19/2018   Generalized anxiety disorder 11/07/2017   Depression 11/07/2017   Insomnia 11/07/2017   Cervical spondylosis 03/19/2013    PCP: Lonn, MD  REFERRING PROVIDER: Lonn, MD  REFERRING DIAG: lumbar radiculopathy  Rationale for Evaluation and Treatment: Rehabilitation  THERAPY DIAG:  Radiculopathy, lumbar region  ONSET DATE: 01/27/24  SUBJECTIVE:  SUBJECTIVE STATEMENT: Patient had left TKA November 10, 2023.  Had lumbar fusion December 2023.  She has done very good regarding the knee, MD discharged the knee yesterday but sent an order for LBP.  She will be seeing Dr. Colon next week for an injection.  PERTINENT HISTORY:  Anxiety, Breast CA, CTS, HA, bilateral TKA, Lumbar fusion, cervical arthroplasty  PAIN:  Are you having pain? Yes: NPRS scale: 7.5/10 Pain location: right low back  and SI Pain description: ache Aggravating factors: end of day pain is up to 10/10,  standing Relieving factors: meloxicam , methocarbomol, lie down, heat, first wake up in the AM at best a 3/10  PRECAUTIONS: None  RED FLAGS: None   WEIGHT BEARING RESTRICTIONS: No  FALLS:  Has patient fallen in last 6 months? No  LIVING ENVIRONMENT: Lives with: lives with their family Lives in: House/apartment Stairs: Yes: Internal: 18 steps; can reach both Has following equipment at home: None  OCCUPATION: retired  PLOF: Independent  PATIENT GOALS: have less pain  NEXT MD VISIT: sees MD for back next week  OBJECTIVE:  Note: Objective measures were completed at Evaluation unless otherwise noted.  DIAGNOSTIC FINDINGS:  IMPRESSION: MRI showed herniated disc at L2-L3  PATIENT SURVEYS:  ODI 54%  COGNITION: Overall cognitive status: Within functional limits for tasks assessed     SENSATION: WFL  MUSCLE LENGTH: Mild HS and piriformis tightness  POSTURE: rounded shoulders, forward head, and decreased lumbar lordosis  PALPATION: Very tight in the lumbar area, very tender in the right SI area, some knots there  LUMBAR ROM:   AROM eval  Flexion Decreased 50%  Extension Decreased 100% with shooting right lateral thigh  Right lateral flexion Decreased 75% with shooting pain  Left lateral flexion Decreased 75% with soreness  Right rotation   Left rotation    (Blank rows = not tested)  LOWER EXTREMITY ROM:     Active  Right eval Left eval  Hip flexion WNL WNL  Hip extension    Hip abduction    Hip adduction    Hip internal rotation    Hip external rotation    Knee flexion    Knee extension    Ankle dorsiflexion    Ankle plantarflexion    Ankle inversion    Ankle eversion     (Blank rows = not tested)  LOWER EXTREMITY MMT:    MMT Right eval Left eval  Hip flexion 4- 4+  Hip extension    Hip abduction 4- 4+  Hip adduction    Hip internal rotation    Hip external rotation    Knee flexion    Knee extension    Ankle dorsiflexion    Ankle  plantarflexion    Ankle inversion    Ankle eversion     (Blank rows = not tested)  LUMBAR SPECIAL TESTS:  Straight leg raise test: Negative, Slump test: Negative, and SI Compression/distraction test: Positive  GAIT: Distance walked: 100 Assistive device utilized: None Level of assistance: Complete Independence Comments: small steps, mild antalgic right  TREATMENT DATE:  01/28/24 Evaluation, HEP  PATIENT EDUCATION:  Education details: POC/HEP Person educated: Patient Education method: Programmer, Multimedia, Facilities Manager, Verbal cues, and Handouts Education comprehension: verbalized understanding  HOME EXERCISE PROGRAM: Access Code: [email or web address] URL: [email or web address] Date: 01/28/2024 Prepared by: Ozell Mainland  Exercises - Lying Prone with 2 Pillows  - 2 x daily - 7 x weekly - 1 sets - 1 reps - 60-120 hold  ASSESSMENT:  CLINICAL IMPRESSION: Patient is a 64 y.o. female who was seen today for physical therapy evaluation and treatment for LBP with right radiculopathy.  She had a 2 level fusion in December 2023, she then has had bilateral TKA's as well as a breast surgery.  An MRI showed a disc herniation at L2.  She did very well with the second TKA but the hypothesis is the limping and gait may have increased some back pain, she is going to get an injection next week   OBJECTIVE IMPAIRMENTS: Abnormal gait, cardiopulmonary status limiting activity, decreased activity tolerance, decreased balance, decreased endurance, decreased mobility, difficulty walking, decreased ROM, decreased strength, increased fascial restrictions, increased muscle spasms, impaired flexibility, improper body mechanics, postural dysfunction, and pain.   REHAB POTENTIAL: Good  CLINICAL DECISION MAKING: Stable/uncomplicated  EVALUATION COMPLEXITY: Low   GOALS: Goals  reviewed with patient? Yes  SHORT TERM GOALS: Target date: 02/18/24  Independent with initial HEP Baseline: Goal status: INITIAL  LONG TERM GOALS: Target date: 05/18/24  Independent with advanced HEP Baseline:  Goal status: INITIAL  2.  Understand posture and body mechanics Baseline:  Goal status: INITIAL  3.  Decrease pain 50% Baseline:  Goal status: INITIAL  4.  Improve ROM 25% Baseline:  Goal status: INITIAL  5.  Improve ODI by 25% Baseline:  Goal status: INITIAL  PLAN:  PT FREQUENCY: 1-2x/week  PT DURATION: 12 weeks  PLANNED INTERVENTIONS: 97164- PT Re-evaluation, 97110-Therapeutic exercises, 97530- Therapeutic activity, 97112- Neuromuscular re-education, 97535- Self Care, 02859- Manual therapy, [phone]- Gait training, [phone]- Electrical stimulation (unattended), [phone]- Ultrasound, Patient/Family education, Balance training, Stair training, Taping, Joint mobilization, Cryotherapy, and Moist heat.  PLAN FOR NEXT SESSION: core work, flexibility   LIBERTY MEDIA, PT 01/28/2024, 2:06 PM  "

## 2024-02-02 ENCOUNTER — Ambulatory Visit (INDEPENDENT_AMBULATORY_CARE_PROVIDER_SITE_OTHER): Admitting: Professional Counselor

## 2024-02-02 ENCOUNTER — Encounter: Payer: Self-pay | Admitting: Professional Counselor

## 2024-02-02 ENCOUNTER — Encounter: Payer: Self-pay | Admitting: *Deleted

## 2024-02-02 DIAGNOSIS — F330 Major depressive disorder, recurrent, mild: Secondary | ICD-10-CM

## 2024-02-02 DIAGNOSIS — F411 Generalized anxiety disorder: Secondary | ICD-10-CM

## 2024-02-02 NOTE — Progress Notes (Unsigned)
"   °      [facility] Counselor/Therapist Progress Note  Patient ID: [name], MRN: [id number],    Date: 02/02/2024  Time Spent: 4:16 PM to 5:12 PM  Treatment Type: Family with patient  Patient presented to session with her spouse.  Reported Symptoms:   Mental Status Exam:  Appearance:   Neat     Behavior:  Appropriate, Sharing, and Motivated  Motor:  Normal  Speech/Language:   Clear and Coherent and Normal Rate  Affect:  Appropriate and Congruent  Mood:  normal  Thought process:  normal  Thought content:    WNL  Sensory/Perceptual disturbances:    WNL  Orientation:  oriented to person, place, time/date, and situation  Attention:  Good  Concentration:  Good  Memory:  WNL  Fund of knowledge:   Good  Insight:    Good  Judgment:   Good  Impulse Control:  Good   Risk Assessment: Danger to Self:  No Self-injurious Behavior: No Danger to Others: No Duty to Warn:no Physical Aggression / Violence:No  Access to Firearms a concern: No  Gang Involvement:No   Subjective: Patient presented to session to address concerns of anxiety and depression.  She reported to session with her spouse.  Patient reported progress.  She identified having come to the conclusion that her daughter and fianc should not move in with she and her spouse, given potential stress, friction and overwhelm.  Counselor, patient and patient's spouse discussed what led to this conclusion, and counselor reinforced patient and spouse progress in relationship and positivity to not adding additional stressors during this relatively early phase of peace and reconciliation since past but recent strife patterns.  Patient identified her New Year's resolution as to prioritize her health, and to reduce clutter and to organize in her home.  Patient and spouse also discussed continuing to work on systems developer.  Counselor reinforced patient personal goals.  Patient identified relationship with her mother to have been improving, and  for the holidays to have been fortifying, and for her mood to have improved, too.  Counselor celebrated and reinforced patient positivity, hopefulness and progress.  Interventions: Solution-Oriented/Positive Psychology, Humanistic/Existential, Insight-Oriented, and Interpersonal  Diagnosis:   ICD-10-CM   1. Generalized anxiety disorder  F41.1     2. Major depressive disorder, recurrent episode, mild  F33.0       Plan: Patient is scheduled for follow-up; continue process work and developing coping skills.  Patient short-term goal between sessions to work to reduce clutter in home, organize, focus on her health, and budgeting needs.  Continue to resource interpersonal effectiveness skills between herself and spouse, and to resource self soothing skills to prevent emotional dysregulation and/or conflict as needed.  Almarie ONEIDA Sprang, [facility]                   "

## 2024-02-04 ENCOUNTER — Ambulatory Visit

## 2024-02-04 DIAGNOSIS — R262 Difficulty in walking, not elsewhere classified: Secondary | ICD-10-CM

## 2024-02-04 DIAGNOSIS — G8929 Other chronic pain: Secondary | ICD-10-CM

## 2024-02-04 DIAGNOSIS — M5416 Radiculopathy, lumbar region: Secondary | ICD-10-CM | POA: Diagnosis not present

## 2024-02-04 DIAGNOSIS — M6281 Muscle weakness (generalized): Secondary | ICD-10-CM

## 2024-02-04 DIAGNOSIS — R600 Localized edema: Secondary | ICD-10-CM

## 2024-02-04 DIAGNOSIS — R2689 Other abnormalities of gait and mobility: Secondary | ICD-10-CM

## 2024-02-04 NOTE — Therapy (Signed)
 " OUTPATIENT PHYSICAL THERAPY THORACOLUMBAR TREATMENT    Patient Name: [name] MRN: [id number] DOB:[birth year], 64 y.o., female Today's Date: 02/04/2024  END OF SESSION:  PT End of Session - 02/04/24 1447     Visit Number 2    Date for Recertification  05/07/24    Authorization Type U[employer]    PT Start Time 0200    PT Stop Time 0245    PT Time Calculation (min) 45 min    Activity Tolerance Patient tolerated treatment well    Behavior During Therapy WFL for tasks assessed/performed           Past Medical History:  Diagnosis Date   Anxiety    Arthritis    Back pain of lumbar region with sciatica    Cancer (HCC)    breast   Depression    Family history of anesthesia complication    grandfather died under anesthesia 70's- had heart issues   H/O carpal tunnel repair 2014   Headache(784.0)    Hyperlipidemia    Hypertension    Hypothyroidism    Medial meniscus tear    PONV (postoperative nausea and vomiting)    Pre-diabetes    Sleep apnea    cpap since 10   Past Surgical History:  Procedure Laterality Date   BILATERAL TOTAL MASTECTOMY WITH AXILLARY LYMPH NODE DISSECTION     BREAST RECONSTRUCTION Left 07/17/2023   for encapsulation,  done at [facility]   CERVICAL DISC ARTHROPLASTY N/A 03/19/2013   Procedure: CERVICAL FIVE TO SIX, CERVICAL SIX TO SEVEN CERVICAL ANTERIOR DISC ARTHROPLASTY;  Surgeon: Victory Gens, MD;  Location: [facility];  Service: Neurosurgery;  Laterality: N/A;  C56 C67 artificial disc replacement   CESAREAN SECTION     COLONOSCOPY     DILATATION & CURETTAGE/HYSTEROSCOPY WITH TRUECLEAR N/A 11/25/2013   Procedure: DILATATION & CURETTAGE/HYSTEROSCOPY WITH TRUCLEAR;  Surgeon: Charlie CHRISTELLA Croak, MD;  Location: [facility];  Service: Gynecology;  Laterality: N/A;   ECTOPIC PREGNANCY SURGERY     GALLBLADDER SURGERY     MOUTH SURGERY     child- dog bite   SPINAL FUSION     2023   STERIOD INJECTION Left 09/09/2022   Procedure: LEFT KNEE STEROID INJECTION;   Surgeon: Melodi Lerner, MD;  Location: [facility];  Service: Orthopedics;  Laterality: Left;  left knee injection 0928   TONSILLECTOMY     TOTAL KNEE ARTHROPLASTY Right 09/09/2022   Procedure: TOTAL KNEE ARTHROPLASTY;  Surgeon: Melodi Lerner, MD;  Location: [facility];  Service: Orthopedics;  Laterality: Right;   TOTAL KNEE ARTHROPLASTY Left 11/10/2023   Procedure: ARTHROPLASTY, KNEE, TOTAL;  Surgeon: Melodi Lerner, MD;  Location: [facility];  Service: Orthopedics;  Laterality: Left;   TUBAL LIGATION     Patient Active Problem List   Diagnosis Date Noted   Primary osteoarthritis of left knee 11/10/2023   OA (osteoarthritis) of knee 09/09/2022   Exertional dyspnea 04/17/2022   Elevated coronary artery calcium  score 04/17/2022   Spondylolisthesis at L4-L5 level 12/27/2021   Ductal carcinoma in situ (DCIS) of left breast 01/19/2018   Generalized anxiety disorder 11/07/2017   Depression 11/07/2017   Insomnia 11/07/2017   Cervical spondylosis 03/19/2013    PCP: Lonn, MD  REFERRING PROVIDER: Lonn, MD  REFERRING DIAG: lumbar radiculopathy  Rationale for Evaluation and Treatment: Rehabilitation  THERAPY DIAG:  Radiculopathy, lumbar region  Localized edema  Muscle weakness (generalized)  Difficulty in walking, not elsewhere classified  Chronic bilateral back pain, unspecified back location  Other  abnormalities of gait and mobility  ONSET DATE: 01/27/24  SUBJECTIVE:                                                                                                                                                                                           SUBJECTIVE STATEMENT: Pt stated she is having an injection done tomorrow morning for her LBP. Today states that pain is about a 4/10; reports it has been gradually getting better since incorporating PT exercises.     Patient had left TKA November 10, 2023.  Had lumbar fusion December 2023.  She has done very good regarding the knee, MD  discharged the knee yesterday but sent an order for LBP.  She will be seeing Dr. Colon next week for an injection.  PERTINENT HISTORY:  Anxiety, Breast CA, CTS, HA, bilateral TKA, Lumbar fusion, cervical arthroplasty  PAIN:  Are you having pain? Yes: NPRS scale: 7.5/10 Pain location: right low back  and SI Pain description: ache Aggravating factors: end of day pain is up to 10/10, standing Relieving factors: meloxicam , methocarbomol, lie down, heat, first wake up in the AM at best a 3/10  PRECAUTIONS: None  RED FLAGS: None   WEIGHT BEARING RESTRICTIONS: No  FALLS:  Has patient fallen in last 6 months? No  LIVING ENVIRONMENT: Lives with: lives with their family Lives in: House/apartment Stairs: Yes: Internal: 18 steps; can reach both Has following equipment at home: None  OCCUPATION: retired  PLOF: Independent  PATIENT GOALS: have less pain  NEXT MD VISIT: sees MD for back next week  OBJECTIVE:  Note: Objective measures were completed at Evaluation unless otherwise noted.  DIAGNOSTIC FINDINGS:  IMPRESSION: MRI showed herniated disc at L2-L3  PATIENT SURVEYS:  ODI 54%  COGNITION: Overall cognitive status: Within functional limits for tasks assessed     SENSATION: WFL  MUSCLE LENGTH: Mild HS and piriformis tightness  POSTURE: rounded shoulders, forward head, and decreased lumbar lordosis  PALPATION: Very tight in the lumbar area, very tender in the right SI area, some knots there  LUMBAR ROM:   AROM eval  Flexion Decreased 50%  Extension Decreased 100% with shooting right lateral thigh  Right lateral flexion Decreased 75% with shooting pain  Left lateral flexion Decreased 75% with soreness  Right rotation   Left rotation    (Blank rows = not tested)  LOWER EXTREMITY ROM:     Active  Right eval Left eval  Hip flexion WNL WNL  Hip extension    Hip abduction    Hip adduction    Hip internal rotation    Hip external rotation  Knee flexion     Knee extension    Ankle dorsiflexion    Ankle plantarflexion    Ankle inversion    Ankle eversion     (Blank rows = not tested)  LOWER EXTREMITY MMT:    MMT Right eval Left eval  Hip flexion 4- 4+  Hip extension    Hip abduction 4- 4+  Hip adduction    Hip internal rotation    Hip external rotation    Knee flexion    Knee extension    Ankle dorsiflexion    Ankle plantarflexion    Ankle inversion    Ankle eversion     (Blank rows = not tested)  LUMBAR SPECIAL TESTS:  Straight leg raise test: Negative, Slump test: Negative, and SI Compression/distraction test: Positive  GAIT: Distance walked: 100 Assistive device utilized: None Level of assistance: Complete Independence Comments: small steps, mild antalgic right  TREATMENT DATE:  02/04/24 Bike L2.5 x 6 min  SI Joint Stretch  Manual Distraction Lumbar Spine Sheet Method  Prone lying 2 min w/ 2 pillows, decreased to 1 for 3 min, 5 min in prone Hip Ext 2x10 in prone position knees flexed  Heat to low back     01/28/24 Evaluation, HEP                                                                                                                                  PATIENT EDUCATION:  Education details: POC/HEP Person educated: Patient Education method: Programmer, Multimedia, Facilities Manager, Verbal cues, and Handouts Education comprehension: verbalized understanding  HOME EXERCISE PROGRAM: Access Code: [email or web address] URL: [email or web address] Date: 01/28/2024 Prepared by: Ozell Mainland  Exercises - Lying Prone with 2 Pillows  - 2 x daily - 7 x weekly - 1 sets - 1 reps - 60-120 hold  ASSESSMENT:  CLINICAL IMPRESSION:  Pt exhibits some LBP with pain in flexion movements and in extension initially but pt able to tolerate extension with increased time. Pt with the ability to lay in a prone flat on mat table today for over 5 minutes with good tolerance and able to do some hip extensions too. Pt sessions will  continue to focus on LBP.     Patient is a 64 y.o. female who was seen today for physical therapy evaluation and treatment for LBP with right radiculopathy.  She had a 2 level fusion in December 2023, she then has had bilateral TKA's as well as a breast surgery.  An MRI showed a disc herniation at L2.  She did very well with the second TKA but the hypothesis is the limping and gait may have increased some back pain, she is going to get an injection next week   OBJECTIVE IMPAIRMENTS: Abnormal gait, cardiopulmonary status limiting activity, decreased activity tolerance, decreased balance, decreased endurance, decreased mobility, difficulty walking, decreased ROM, decreased strength, increased fascial restrictions, increased muscle spasms, impaired flexibility, improper body mechanics, postural dysfunction, and pain.  REHAB POTENTIAL: Good  CLINICAL DECISION MAKING: Stable/uncomplicated  EVALUATION COMPLEXITY: Low   GOALS: Goals reviewed with patient? Yes  SHORT TERM GOALS: Target date: 02/18/24  Independent with initial HEP Baseline: Goal status: MET 02/04/24  LONG TERM GOALS: Target date: 05/18/24  Independent with advanced HEP Baseline:  Goal status: IN PROGRESS 02/04/24  2.  Understand posture and body mechanics Baseline:  Goal status: IN PROGRESS 02/04/24  3.  Decrease pain 50% Baseline:  Goal status: IN PROGRESS 4/10 today 02/04/24  4.  Improve ROM 25% Baseline:  Goal status: INITIAL  5.  Improve ODI by 25% Baseline:  Goal status: INITIAL  PLAN:  PT FREQUENCY: 1-2x/week  PT DURATION: 12 weeks  PLANNED INTERVENTIONS: 97164- PT Re-evaluation, 97110-Therapeutic exercises, 97530- Therapeutic activity, 97112- Neuromuscular re-education, 97535- Self Care, 02859- Manual therapy, [phone]- Gait training, [phone]- Electrical stimulation (unattended), [phone]- Ultrasound, Patient/Family education, Balance training, Stair training, Taping, Joint mobilization, Cryotherapy, and Moist  heat.  PLAN FOR NEXT SESSION: core work, flexibility   [employer], Student-PT 02/04/2024, 2:48 PM  "

## 2024-02-06 ENCOUNTER — Other Ambulatory Visit: Payer: Self-pay | Admitting: Cardiology

## 2024-02-06 DIAGNOSIS — R931 Abnormal findings on diagnostic imaging of heart and coronary circulation: Secondary | ICD-10-CM

## 2024-02-09 NOTE — Telephone Encounter (Signed)
 In accordance with refill protocols, please review and address the following requirements before this medication refill can be authorized:  Appointment  and Labs

## 2024-02-09 NOTE — Telephone Encounter (Signed)
 Seen by me in 2024 and made as needed.  Defer refill to PCP.  Thanks MJP

## 2024-02-11 ENCOUNTER — Encounter: Payer: Self-pay | Admitting: Physical Therapy

## 2024-02-11 ENCOUNTER — Ambulatory Visit: Admitting: Physical Therapy

## 2024-02-11 DIAGNOSIS — M5416 Radiculopathy, lumbar region: Secondary | ICD-10-CM | POA: Diagnosis not present

## 2024-02-11 DIAGNOSIS — M6281 Muscle weakness (generalized): Secondary | ICD-10-CM

## 2024-02-11 DIAGNOSIS — R262 Difficulty in walking, not elsewhere classified: Secondary | ICD-10-CM

## 2024-02-11 DIAGNOSIS — G8929 Other chronic pain: Secondary | ICD-10-CM

## 2024-02-11 NOTE — Therapy (Signed)
 " OUTPATIENT PHYSICAL THERAPY THORACOLUMBAR TREATMENT    Patient Name: [name] MRN: [id number] DOB:22-May-1960, 64 y.o., female Today's Date: 02/11/2024  END OF SESSION:  PT End of Session - 02/11/24 1456     Visit Number 3    Date for Recertification  05/07/24    Authorization Type UHC    PT Start Time 1445    PT Stop Time 1530    PT Time Calculation (min) 45 min    Activity Tolerance Patient tolerated treatment well    Behavior During Therapy WFL for tasks assessed/performed           Past Medical History:  Diagnosis Date   Anxiety    Arthritis    Back pain of lumbar region with sciatica    Cancer (HCC)    breast   Depression    Family history of anesthesia complication    grandfather died under anesthesia 70's- had heart issues   H/O carpal tunnel repair 2014   Headache(784.0)    Hyperlipidemia    Hypertension    Hypothyroidism    Medial meniscus tear    PONV (postoperative nausea and vomiting)    Pre-diabetes    Sleep apnea    cpap since 10   Past Surgical History:  Procedure Laterality Date   BILATERAL TOTAL MASTECTOMY WITH AXILLARY LYMPH NODE DISSECTION     BREAST RECONSTRUCTION Left 07/17/2023   for encapsulation,  done at [facility]   CERVICAL DISC ARTHROPLASTY N/A 03/19/2013   Procedure: CERVICAL FIVE TO SIX, CERVICAL SIX TO SEVEN CERVICAL ANTERIOR DISC ARTHROPLASTY;  Surgeon: Victory Gens, MD;  Location: [facility];  Service: Neurosurgery;  Laterality: N/A;  C56 C67 artificial disc replacement   CESAREAN SECTION     COLONOSCOPY     DILATATION & CURETTAGE/HYSTEROSCOPY WITH TRUECLEAR N/A 11/25/2013   Procedure: DILATATION & CURETTAGE/HYSTEROSCOPY WITH TRUCLEAR;  Surgeon: Charlie CHRISTELLA Croak, MD;  Location: [facility];  Service: Gynecology;  Laterality: N/A;   ECTOPIC PREGNANCY SURGERY     GALLBLADDER SURGERY     MOUTH SURGERY     child- dog bite   SPINAL FUSION     2023   STERIOD INJECTION Left 09/09/2022   Procedure: LEFT KNEE STEROID INJECTION;   Surgeon: Melodi Lerner, MD;  Location: [facility];  Service: Orthopedics;  Laterality: Left;  left knee injection 0928   TONSILLECTOMY     TOTAL KNEE ARTHROPLASTY Right 09/09/2022   Procedure: TOTAL KNEE ARTHROPLASTY;  Surgeon: Melodi Lerner, MD;  Location: [facility];  Service: Orthopedics;  Laterality: Right;   TOTAL KNEE ARTHROPLASTY Left 11/10/2023   Procedure: ARTHROPLASTY, KNEE, TOTAL;  Surgeon: Melodi Lerner, MD;  Location: [facility];  Service: Orthopedics;  Laterality: Left;   TUBAL LIGATION     Patient Active Problem List   Diagnosis Date Noted   Primary osteoarthritis of left knee 11/10/2023   OA (osteoarthritis) of knee 09/09/2022   Exertional dyspnea 04/17/2022   Elevated coronary artery calcium  score 04/17/2022   Spondylolisthesis at L4-L5 level 12/27/2021   Ductal carcinoma in situ (DCIS) of left breast 01/19/2018   Generalized anxiety disorder 11/07/2017   Depression 11/07/2017   Insomnia 11/07/2017   Cervical spondylosis 03/19/2013    PCP: Lonn, MD  REFERRING PROVIDER: Lonn, MD  REFERRING DIAG: lumbar radiculopathy  Rationale for Evaluation and Treatment: Rehabilitation  THERAPY DIAG:  Radiculopathy, lumbar region  Muscle weakness (generalized)  Difficulty in walking, not elsewhere classified  Chronic bilateral back pain, unspecified back location  ONSET DATE: 01/27/24  SUBJECTIVE:                                                                                                                                                                                           SUBJECTIVE STATEMENT: Patient reports that she had the injection last week, reports that it helped a little bit   Patient had left TKA November 10, 2023.  Had lumbar fusion December 2023.  She has done very good regarding the knee, MD discharged the knee yesterday but sent an order for LBP.  She will be seeing Dr. Colon next week for an injection.  PERTINENT HISTORY:  Anxiety, Breast CA, CTS,  HA, bilateral TKA, Lumbar fusion, cervical arthroplasty  PAIN:  Are you having pain? Yes: NPRS scale: 7.5/10 Pain location: right low back  and SI Pain description: ache Aggravating factors: end of day pain is up to 10/10, standing Relieving factors: meloxicam , methocarbomol, lie down, heat, first wake up in the AM at best a 3/10  PRECAUTIONS: None  RED FLAGS: None   WEIGHT BEARING RESTRICTIONS: No  FALLS:  Has patient fallen in last 6 months? No  LIVING ENVIRONMENT: Lives with: lives with their family Lives in: House/apartment Stairs: Yes: Internal: 18 steps; can reach both Has following equipment at home: None  OCCUPATION: retired  PLOF: Independent  PATIENT GOALS: have less pain  NEXT MD VISIT: sees MD for back next week  OBJECTIVE:  Note: Objective measures were completed at Evaluation unless otherwise noted.  DIAGNOSTIC FINDINGS:  IMPRESSION: MRI showed herniated disc at L2-L3  PATIENT SURVEYS:  ODI 54%  COGNITION: Overall cognitive status: Within functional limits for tasks assessed     SENSATION: WFL  MUSCLE LENGTH: Mild HS and piriformis tightness  POSTURE: rounded shoulders, forward head, and decreased lumbar lordosis  PALPATION: Very tight in the lumbar area, very tender in the right SI area, some knots there  LUMBAR ROM:   AROM eval  Flexion Decreased 50%  Extension Decreased 100% with shooting right lateral thigh  Right lateral flexion Decreased 75% with shooting pain  Left lateral flexion Decreased 75% with soreness  Right rotation   Left rotation    (Blank rows = not tested)  LOWER EXTREMITY ROM:     Active  Right eval Left eval  Hip flexion WNL WNL  Hip extension    Hip abduction    Hip adduction    Hip internal rotation    Hip external rotation    Knee flexion    Knee extension    Ankle dorsiflexion    Ankle plantarflexion    Ankle inversion    Ankle eversion     (  Blank rows = not tested)  LOWER EXTREMITY MMT:     MMT Right eval Left eval  Hip flexion 4- 4+  Hip extension    Hip abduction 4- 4+  Hip adduction    Hip internal rotation    Hip external rotation    Knee flexion    Knee extension    Ankle dorsiflexion    Ankle plantarflexion    Ankle inversion    Ankle eversion     (Blank rows = not tested)  LUMBAR SPECIAL TESTS:  Straight leg raise test: Negative, Slump test: Negative, and SI Compression/distraction test: Positive  GAIT: Distance walked: 100 Assistive device utilized: None Level of assistance: Complete Independence Comments: small steps, mild antalgic right  TREATMENT DATE:  02/11/24 Bike level 4 x 6 minutes 10# hip extension 10# straight arm pulls Feet on ball K2C, rotation, bridge, isometric abs Manual sheet traction Passive stretch LE's STM to the right SI and lumbar area  02/04/24 Bike L2.5 x 6 min  SI Joint Stretch  Manual Distraction Lumbar Spine Sheet Method  Prone lying 2 min w/ 2 pillows, decreased to 1 for 3 min, 5 min in prone Hip Ext 2x10 in prone position knees flexed  Heat to low back   01/28/24 Evaluation, HEP                                                                                                                                  PATIENT EDUCATION:  Education details: POC/HEP Person educated: Patient Education method: Programmer, Multimedia, Facilities Manager, Verbal cues, and Handouts Education comprehension: verbalized understanding  HOME EXERCISE PROGRAM: Access Code: [email or web address] URL: [email or web address] Date: 01/28/2024 Prepared by: Ozell Mainland  Exercises - Lying Prone with 2 Pillows  - 2 x daily - 7 x weekly - 1 sets - 1 reps - 60-120 hold  ASSESSMENT:  CLINICAL IMPRESSION:  Pt exhibits some LBP with pain in flexion movements and in extension initially but pt able to tolerate extension with increased time. She reports minimal relief with the recent injection.    Patient is a 64 y.o. female who was seen today for  physical therapy evaluation and treatment for LBP with right radiculopathy.  She had a 2 level fusion in December 2023, she then has had bilateral TKA's as well as a breast surgery.  An MRI showed a disc herniation at L2.  She did very well with the second TKA but the hypothesis is the limping and gait may have increased some back pain, she is going to get an injection next week   OBJECTIVE IMPAIRMENTS: Abnormal gait, cardiopulmonary status limiting activity, decreased activity tolerance, decreased balance, decreased endurance, decreased mobility, difficulty walking, decreased ROM, decreased strength, increased fascial restrictions, increased muscle spasms, impaired flexibility, improper body mechanics, postural dysfunction, and pain.   REHAB POTENTIAL: Good  CLINICAL DECISION MAKING: Stable/uncomplicated  EVALUATION COMPLEXITY: Low   GOALS: Goals reviewed with patient? Yes  SHORT TERM GOALS:  Target date: 02/18/24  Independent with initial HEP Baseline: Goal status: MET 02/04/24  LONG TERM GOALS: Target date: 05/18/24  Independent with advanced HEP Baseline:  Goal status: IN PROGRESS 02/04/24  2.  Understand posture and body mechanics Baseline:  Goal status: IN PROGRESS 02/04/24  3.  Decrease pain 50% Baseline:  Goal status: IN PROGRESS 4/10 today 02/04/24  4.  Improve ROM 25% Baseline:  Goal status: progressing 02/11/24  5.  Improve ODI by 25% Baseline:  Goal status: ongoing 02/11/24  PLAN:  PT FREQUENCY: 1-2x/week  PT DURATION: 12 weeks  PLANNED INTERVENTIONS: 97164- PT Re-evaluation, 97110-Therapeutic exercises, 97530- Therapeutic activity, 97112- Neuromuscular re-education, 97535- Self Care, 02859- Manual therapy, [phone]- Gait training, [phone]- Electrical stimulation (unattended), [phone]- Ultrasound, Patient/Family education, Balance training, Stair training, Taping, Joint mobilization, Cryotherapy, and Moist heat.  PLAN FOR NEXT SESSION: core work,  flexibility   [employer], PT 02/11/2024, 2:56 PM  "

## 2024-02-18 ENCOUNTER — Other Ambulatory Visit (HOSPITAL_COMMUNITY): Payer: Self-pay

## 2024-02-18 ENCOUNTER — Ambulatory Visit: Admitting: Physical Therapy

## 2024-02-25 ENCOUNTER — Ambulatory Visit: Attending: Neurological Surgery

## 2024-02-25 DIAGNOSIS — M25561 Pain in right knee: Secondary | ICD-10-CM

## 2024-02-25 DIAGNOSIS — Z96651 Presence of right artificial knee joint: Secondary | ICD-10-CM

## 2024-02-25 DIAGNOSIS — M25661 Stiffness of right knee, not elsewhere classified: Secondary | ICD-10-CM

## 2024-02-25 DIAGNOSIS — R262 Difficulty in walking, not elsewhere classified: Secondary | ICD-10-CM

## 2024-02-25 DIAGNOSIS — M6281 Muscle weakness (generalized): Secondary | ICD-10-CM

## 2024-02-25 DIAGNOSIS — M25662 Stiffness of left knee, not elsewhere classified: Secondary | ICD-10-CM

## 2024-02-25 DIAGNOSIS — R2689 Other abnormalities of gait and mobility: Secondary | ICD-10-CM

## 2024-02-25 DIAGNOSIS — M5416 Radiculopathy, lumbar region: Secondary | ICD-10-CM

## 2024-02-25 DIAGNOSIS — M25562 Pain in left knee: Secondary | ICD-10-CM

## 2024-02-25 DIAGNOSIS — G8929 Other chronic pain: Secondary | ICD-10-CM

## 2024-02-25 NOTE — Therapy (Signed)
 " OUTPATIENT PHYSICAL THERAPY THORACOLUMBAR TREATMENT    Patient Name: [name] MRN: [id number] DOB:1960-10-13, 64 y.o., female Today's Date: 02/25/2024  END OF SESSION:  PT End of Session - 02/25/24 1448     Visit Number 4    Date for Recertification  05/07/24    Authorization Type [employer]    PT Start Time 1406    PT Stop Time 1505    PT Time Calculation (min) 59 min    Activity Tolerance Patient tolerated treatment well    Behavior During Therapy WFL for tasks assessed/performed         Past Medical History:  Diagnosis Date   Anxiety    Arthritis    Back pain of lumbar region with sciatica    Cancer (HCC)    breast   Depression    Family history of anesthesia complication    grandfather died under anesthesia 70's- had heart issues   H/O carpal tunnel repair 2014   Headache(784.0)    Hyperlipidemia    Hypertension    Hypothyroidism    Medial meniscus tear    PONV (postoperative nausea and vomiting)    Pre-diabetes    Sleep apnea    cpap since 10   Past Surgical History:  Procedure Laterality Date   BILATERAL TOTAL MASTECTOMY WITH AXILLARY LYMPH NODE DISSECTION     BREAST RECONSTRUCTION Left 07/17/2023   for encapsulation,  done at [facility]   CERVICAL DISC ARTHROPLASTY N/A 03/19/2013   Procedure: CERVICAL FIVE TO SIX, CERVICAL SIX TO SEVEN CERVICAL ANTERIOR DISC ARTHROPLASTY;  Surgeon: Victory Gens, MD;  Location: [facility];  Service: Neurosurgery;  Laterality: N/A;  C56 C67 artificial disc replacement   CESAREAN SECTION     COLONOSCOPY     DILATATION & CURETTAGE/HYSTEROSCOPY WITH TRUECLEAR N/A 11/25/2013   Procedure: DILATATION & CURETTAGE/HYSTEROSCOPY WITH TRUCLEAR;  Surgeon: Charlie CHRISTELLA Croak, MD;  Location: [facility];  Service: Gynecology;  Laterality: N/A;   ECTOPIC PREGNANCY SURGERY     GALLBLADDER SURGERY     MOUTH SURGERY     child- dog bite   SPINAL FUSION     2023   STERIOD INJECTION Left 09/09/2022   Procedure: LEFT KNEE STEROID INJECTION;  Surgeon:  Melodi Lerner, MD;  Location: [facility];  Service: Orthopedics;  Laterality: Left;  left knee injection 0928   TONSILLECTOMY     TOTAL KNEE ARTHROPLASTY Right 09/09/2022   Procedure: TOTAL KNEE ARTHROPLASTY;  Surgeon: Melodi Lerner, MD;  Location: [facility];  Service: Orthopedics;  Laterality: Right;   TOTAL KNEE ARTHROPLASTY Left 11/10/2023   Procedure: ARTHROPLASTY, KNEE, TOTAL;  Surgeon: Melodi Lerner, MD;  Location: [facility];  Service: Orthopedics;  Laterality: Left;   TUBAL LIGATION     Patient Active Problem List   Diagnosis Date Noted   Primary osteoarthritis of left knee 11/10/2023   OA (osteoarthritis) of knee 09/09/2022   Exertional dyspnea 04/17/2022   Elevated coronary artery calcium  score 04/17/2022   Spondylolisthesis at L4-L5 level 12/27/2021   Ductal carcinoma in situ (DCIS) of left breast 01/19/2018   Generalized anxiety disorder 11/07/2017   Depression 11/07/2017   Insomnia 11/07/2017   Cervical spondylosis 03/19/2013    PCP: Lonn, MD  REFERRING PROVIDER: Lonn, MD  REFERRING DIAG: lumbar radiculopathy  Rationale for Evaluation and Treatment: Rehabilitation  THERAPY DIAG:  Radiculopathy, lumbar region  Chronic bilateral back pain, unspecified back location  Acute pain of left knee  Stiffness of right knee, not elsewhere classified  Muscle weakness (generalized)  Stiffness of left knee, not elsewhere classified  Acute pain of right knee  Difficulty in walking, not elsewhere classified  Other abnormalities of gait and mobility  S/P total knee arthroplasty, right  ONSET DATE: 01/27/24  SUBJECTIVE:                                                                                                                                                                                           SUBJECTIVE STATEMENT: Pt reports she has an appointment with neuro surgeon at the end of this month. Pt states 6.5/10 NPS pain today in the low back upon arrival of PT.     Patient had left TKA November 10, 2023.  Had lumbar fusion December 2023.  She has done very good regarding the knee, MD discharged the knee yesterday but sent an order for LBP.  She will be seeing Dr. Colon next week for an injection.  PERTINENT HISTORY:  Anxiety, Breast CA, CTS, HA, bilateral TKA, Lumbar fusion, cervical arthroplasty  PAIN:  Are you having pain? Yes: NPRS scale: 7.5/10 Pain location: right low back  and SI Pain description: ache Aggravating factors: end of day pain is up to 10/10, standing Relieving factors: meloxicam , methocarbomol, lie down, heat, first wake up in the AM at best a 3/10  PRECAUTIONS: None  RED FLAGS: None   WEIGHT BEARING RESTRICTIONS: No  FALLS:  Has patient fallen in last 6 months? No  LIVING ENVIRONMENT: Lives with: lives with their family Lives in: House/apartment Stairs: Yes: Internal: 18 steps; can reach both Has following equipment at home: None  OCCUPATION: retired  PLOF: Independent  PATIENT GOALS: have less pain  NEXT MD VISIT: sees MD for back next week  OBJECTIVE:  Note: Objective measures were completed at Evaluation unless otherwise noted.  DIAGNOSTIC FINDINGS:  IMPRESSION: MRI showed herniated disc at L2-L3  PATIENT SURVEYS:  ODI 54%  COGNITION: Overall cognitive status: Within functional limits for tasks assessed     SENSATION: WFL  MUSCLE LENGTH: Mild HS and piriformis tightness  POSTURE: rounded shoulders, forward head, and decreased lumbar lordosis  PALPATION: Very tight in the lumbar area, very tender in the right SI area, some knots there  LUMBAR ROM:   AROM eval  Flexion Decreased 50%  Extension Decreased 100% with shooting right lateral thigh  Right lateral flexion Decreased 75% with shooting pain  Left lateral flexion Decreased 75% with soreness  Right rotation   Left rotation    (Blank rows = not tested)  LOWER EXTREMITY ROM:     Active  Right eval Left eval  Hip flexion WNL  WNL  Hip extension  Hip abduction    Hip adduction    Hip internal rotation    Hip external rotation    Knee flexion    Knee extension    Ankle dorsiflexion    Ankle plantarflexion    Ankle inversion    Ankle eversion     (Blank rows = not tested)  LOWER EXTREMITY MMT:    MMT Right eval Left eval  Hip flexion 4- 4+  Hip extension    Hip abduction 4- 4+  Hip adduction    Hip internal rotation    Hip external rotation    Knee flexion    Knee extension    Ankle dorsiflexion    Ankle plantarflexion    Ankle inversion    Ankle eversion     (Blank rows = not tested)  LUMBAR SPECIAL TESTS:  Straight leg raise test: Negative, Slump test: Negative, and SI Compression/distraction test: Positive  GAIT: Distance walked: 100 Assistive device utilized: None Level of assistance: Complete Independence Comments: small steps, mild antalgic right  TREATMENT DATE:  02/25/24 Bike L 2.0 x 6 min  SB Rollouts for low back Supine Windshield wipers lumbar stretch  Feet on ball knee to chest stretch  Feet on ball lateral rotation  Clamshells in supine blue band 2x10 Heat to low back   02/11/24 Bike level 4 x 6 minutes 10# hip extension 10# straight arm pulls Feet on ball K2C, rotation, bridge, isometric abs Manual sheet traction Passive stretch LE's STM to the right SI and lumbar area  02/04/24 Bike L2.5 x 6 min  SI Joint Stretch  Manual Distraction Lumbar Spine Sheet Method  Prone lying 2 min w/ 2 pillows, decreased to 1 for 3 min, 5 min in prone Hip Ext 2x10 in prone position knees flexed  Heat to low back   01/28/24 Evaluation, HEP                                                                                                                                  PATIENT EDUCATION:  Education details: POC/HEP Person educated: Patient Education method: Programmer, Multimedia, Facilities Manager, Verbal cues, and Handouts Education comprehension: verbalized understanding  HOME EXERCISE  PROGRAM: Access Code: [email or web address] URL: [email or web address] Date: 01/28/2024 Prepared by: Ozell Mainland  Exercises - Lying Prone with 2 Pillows  - 2 x daily - 7 x weekly - 1 sets - 1 reps - 60-120 hold  ASSESSMENT:  CLINICAL IMPRESSION:  Pt exhibits some LBP with pain today and pain tends to increased with repeated movement activity. Pt education on exercise intensity and managing pain levels. Pt will continue to benefit from skilled PT in order to reduce low back pain and improve functional movement quality. Session focused on light stretching and light functional movement patterns.  Patient is a 64 y.o. female who was seen today for physical therapy evaluation and treatment for LBP with right radiculopathy.  She had a 2 level fusion in December  2023, she then has had bilateral TKA's as well as a breast surgery.  An MRI showed a disc herniation at L2.  She did very well with the second TKA but the hypothesis is the limping and gait may have increased some back pain, she is going to get an injection next week   OBJECTIVE IMPAIRMENTS: Abnormal gait, cardiopulmonary status limiting activity, decreased activity tolerance, decreased balance, decreased endurance, decreased mobility, difficulty walking, decreased ROM, decreased strength, increased fascial restrictions, increased muscle spasms, impaired flexibility, improper body mechanics, postural dysfunction, and pain.   REHAB POTENTIAL: Good  CLINICAL DECISION MAKING: Stable/uncomplicated  EVALUATION COMPLEXITY: Low   GOALS: Goals reviewed with patient? Yes  SHORT TERM GOALS: Target date: 02/18/24  Independent with initial HEP Baseline: Goal status: MET 02/04/24  LONG TERM GOALS: Target date: 05/18/24  Independent with advanced HEP Baseline:  Goal status: IN PROGRESS 02/25/24  2.  Understand posture and body mechanics Baseline:  Goal status: IN PROGRESS 02/25/24  3.  Decrease pain 50% Baseline:  Goal status: IN  PROGRESS 4/10 today 02/04/24; 6.5/10 02/25/24  4.  Improve ROM 25% Baseline:  Goal status: progressing 02/11/24  5.  Improve ODI by 25% Baseline:  Goal status: ongoing 02/11/24  PLAN:  PT FREQUENCY: 1-2x/week  PT DURATION: 12 weeks  PLANNED INTERVENTIONS: 97164- PT Re-evaluation, 97110-Therapeutic exercises, 97530- Therapeutic activity, 97112- Neuromuscular re-education, 97535- Self Care, [id number]- Manual therapy, [phone]- Gait training, [phone]- Electrical stimulation (unattended), [phone]- Ultrasound, Patient/Family education, Balance training, Stair training, Taping, Joint mobilization, Cryotherapy, and Moist heat.  PLAN FOR NEXT SESSION: core work, flexibility   [employer], Student-PT 02/25/2024, 3:31 PM  "

## 2024-03-03 ENCOUNTER — Ambulatory Visit: Admitting: Physical Therapy

## 2024-03-08 ENCOUNTER — Ambulatory Visit: Admitting: Physical Therapy

## 2024-03-09 ENCOUNTER — Ambulatory Visit: Admitting: Professional Counselor

## 2024-03-15 ENCOUNTER — Ambulatory Visit: Admitting: Physical Therapy

## 2024-03-22 ENCOUNTER — Ambulatory Visit: Attending: Neurological Surgery | Admitting: Physical Therapy

## 2024-04-28 ENCOUNTER — Ambulatory Visit: Admitting: Professional Counselor

## 2024-07-28 ENCOUNTER — Ambulatory Visit: Admitting: Professional Counselor

## 2024-10-21 ENCOUNTER — Telehealth: Admitting: Adult Health

## 2024-10-27 ENCOUNTER — Ambulatory Visit: Admitting: Professional Counselor

## 2024-11-04 ENCOUNTER — Telehealth: Admitting: Adult Health
